# Patient Record
Sex: Female | Born: 1940
Health system: Southern US, Community
[De-identification: ages and names within clinical notes are randomized; demographics above are authoritative.]

## PROBLEM LIST (undated history)

## (undated) DIAGNOSIS — L309 Dermatitis, unspecified: Secondary | ICD-10-CM

## (undated) DIAGNOSIS — R002 Palpitations: Secondary | ICD-10-CM

## (undated) DIAGNOSIS — IMO0001 Reserved for inherently not codable concepts without codable children: Secondary | ICD-10-CM

## (undated) DIAGNOSIS — I10 Essential (primary) hypertension: Secondary | ICD-10-CM

## (undated) DIAGNOSIS — Z853 Personal history of malignant neoplasm of breast: Secondary | ICD-10-CM

## (undated) DIAGNOSIS — Z923 Personal history of irradiation: Secondary | ICD-10-CM

## (undated) DIAGNOSIS — L509 Urticaria, unspecified: Secondary | ICD-10-CM

## (undated) DIAGNOSIS — C541 Malignant neoplasm of endometrium: Secondary | ICD-10-CM

## (undated) DIAGNOSIS — I493 Ventricular premature depolarization: Secondary | ICD-10-CM

## (undated) DIAGNOSIS — IMO0002 Reserved for concepts with insufficient information to code with codable children: Secondary | ICD-10-CM

## (undated) DIAGNOSIS — Z803 Family history of malignant neoplasm of breast: Secondary | ICD-10-CM

## (undated) DIAGNOSIS — E785 Hyperlipidemia, unspecified: Secondary | ICD-10-CM

## (undated) DIAGNOSIS — E7800 Pure hypercholesterolemia, unspecified: Secondary | ICD-10-CM

## (undated) HISTORY — PX: FOOT SURGERY: SHX648

## (undated) HISTORY — DX: Palpitations: R00.2

## (undated) HISTORY — DX: Malignant neoplasm of endometrium: C54.1

## (undated) HISTORY — DX: Personal history of malignant neoplasm of breast: Z85.3

## (undated) HISTORY — DX: Reserved for inherently not codable concepts without codable children: IMO0001

## (undated) HISTORY — PX: TOTAL ABDOMINAL HYSTERECTOMY W/ BILATERAL SALPINGOOPHORECTOMY: SHX83

## (undated) HISTORY — DX: Essential (primary) hypertension: I10

## (undated) HISTORY — DX: Family history of malignant neoplasm of breast: Z80.3

## (undated) HISTORY — DX: Hyperlipidemia, unspecified: E78.5

## (undated) HISTORY — DX: Dermatitis, unspecified: L30.9

## (undated) HISTORY — PX: COLONOSCOPY: SHX174

## (undated) HISTORY — PX: ADENOIDECTOMY: SUR15

## (undated) HISTORY — DX: Pure hypercholesterolemia, unspecified: E78.00

## (undated) HISTORY — DX: Ventricular premature depolarization: I49.3

## (undated) HISTORY — DX: Urticaria, unspecified: L50.9

## (undated) HISTORY — PX: TONSILLECTOMY: SHX5217

## (undated) HISTORY — DX: Personal history of irradiation: Z92.3

## (undated) HISTORY — PX: APPENDECTOMY: SHX54

## (undated) HISTORY — PX: TONSILLECTOMY: SUR1361

## (undated) HISTORY — DX: Reserved for concepts with insufficient information to code with codable children: IMO0002

---

## 1998-05-13 DIAGNOSIS — Z853 Personal history of malignant neoplasm of breast: Secondary | ICD-10-CM

## 1998-05-13 HISTORY — DX: Personal history of malignant neoplasm of breast: Z85.3

## 1998-05-13 HISTORY — PX: BREAST LUMPECTOMY: SHX2

## 1998-12-06 ENCOUNTER — Ambulatory Visit (HOSPITAL_COMMUNITY): Admission: RE | Admit: 1998-12-06 | Discharge: 1998-12-06 | Payer: Self-pay | Admitting: Cardiology

## 1998-12-06 ENCOUNTER — Encounter: Payer: Self-pay | Admitting: Cardiology

## 1998-12-25 ENCOUNTER — Other Ambulatory Visit: Admission: RE | Admit: 1998-12-25 | Discharge: 1998-12-25 | Payer: Self-pay | Admitting: Obstetrics and Gynecology

## 1998-12-26 ENCOUNTER — Other Ambulatory Visit: Admission: RE | Admit: 1998-12-26 | Discharge: 1998-12-26 | Payer: Self-pay | Admitting: Obstetrics and Gynecology

## 1998-12-26 ENCOUNTER — Encounter (INDEPENDENT_AMBULATORY_CARE_PROVIDER_SITE_OTHER): Payer: Self-pay | Admitting: Specialist

## 1999-03-15 ENCOUNTER — Encounter (INDEPENDENT_AMBULATORY_CARE_PROVIDER_SITE_OTHER): Payer: Self-pay | Admitting: *Deleted

## 1999-03-15 ENCOUNTER — Ambulatory Visit (HOSPITAL_BASED_OUTPATIENT_CLINIC_OR_DEPARTMENT_OTHER): Admission: RE | Admit: 1999-03-15 | Discharge: 1999-03-15 | Payer: Self-pay

## 1999-03-21 ENCOUNTER — Encounter: Admission: RE | Admit: 1999-03-21 | Discharge: 1999-06-19 | Payer: Self-pay | Admitting: Radiation Oncology

## 1999-03-30 ENCOUNTER — Encounter (INDEPENDENT_AMBULATORY_CARE_PROVIDER_SITE_OTHER): Payer: Self-pay | Admitting: *Deleted

## 1999-03-30 ENCOUNTER — Ambulatory Visit (HOSPITAL_COMMUNITY): Admission: RE | Admit: 1999-03-30 | Discharge: 1999-04-02 | Payer: Self-pay

## 1999-06-20 ENCOUNTER — Encounter: Admission: RE | Admit: 1999-06-20 | Discharge: 1999-09-18 | Payer: Self-pay | Admitting: Radiation Oncology

## 1999-09-06 ENCOUNTER — Ambulatory Visit (HOSPITAL_COMMUNITY): Admission: RE | Admit: 1999-09-06 | Discharge: 1999-09-06 | Payer: Self-pay | Admitting: Psychiatry

## 1999-11-12 ENCOUNTER — Encounter: Admission: RE | Admit: 1999-11-12 | Discharge: 1999-11-12 | Payer: Self-pay | Admitting: Oncology

## 1999-11-12 ENCOUNTER — Encounter: Payer: Self-pay | Admitting: Oncology

## 2000-01-17 ENCOUNTER — Other Ambulatory Visit: Admission: RE | Admit: 2000-01-17 | Discharge: 2000-01-17 | Payer: Self-pay | Admitting: Obstetrics and Gynecology

## 2000-03-03 ENCOUNTER — Other Ambulatory Visit: Admission: RE | Admit: 2000-03-03 | Discharge: 2000-03-03 | Payer: Self-pay | Admitting: Obstetrics and Gynecology

## 2000-03-03 ENCOUNTER — Encounter (INDEPENDENT_AMBULATORY_CARE_PROVIDER_SITE_OTHER): Payer: Self-pay | Admitting: Specialist

## 2000-04-07 ENCOUNTER — Ambulatory Visit (HOSPITAL_COMMUNITY): Admission: RE | Admit: 2000-04-07 | Discharge: 2000-04-07 | Payer: Self-pay | Admitting: Neurology

## 2000-04-07 ENCOUNTER — Encounter: Payer: Self-pay | Admitting: Neurology

## 2000-07-10 ENCOUNTER — Other Ambulatory Visit: Admission: RE | Admit: 2000-07-10 | Discharge: 2000-07-10 | Payer: Self-pay | Admitting: Obstetrics and Gynecology

## 2000-11-12 ENCOUNTER — Other Ambulatory Visit: Admission: RE | Admit: 2000-11-12 | Discharge: 2000-11-12 | Payer: Self-pay | Admitting: Obstetrics and Gynecology

## 2000-12-03 ENCOUNTER — Ambulatory Visit (HOSPITAL_COMMUNITY): Admission: RE | Admit: 2000-12-03 | Discharge: 2000-12-03 | Payer: Self-pay | Admitting: Cardiology

## 2000-12-03 ENCOUNTER — Encounter: Payer: Self-pay | Admitting: Cardiology

## 2000-12-08 ENCOUNTER — Encounter: Admission: RE | Admit: 2000-12-08 | Discharge: 2000-12-08 | Payer: Self-pay | Admitting: Oncology

## 2000-12-08 ENCOUNTER — Encounter: Payer: Self-pay | Admitting: Oncology

## 2001-03-18 ENCOUNTER — Other Ambulatory Visit: Admission: RE | Admit: 2001-03-18 | Discharge: 2001-03-18 | Payer: Self-pay | Admitting: Obstetrics and Gynecology

## 2001-08-14 ENCOUNTER — Ambulatory Visit (HOSPITAL_COMMUNITY): Admission: RE | Admit: 2001-08-14 | Discharge: 2001-08-14 | Payer: Self-pay | Admitting: Gastroenterology

## 2001-12-18 ENCOUNTER — Encounter: Admission: RE | Admit: 2001-12-18 | Discharge: 2001-12-18 | Payer: Self-pay | Admitting: Oncology

## 2001-12-18 ENCOUNTER — Encounter: Payer: Self-pay | Admitting: Oncology

## 2002-03-24 ENCOUNTER — Other Ambulatory Visit: Admission: RE | Admit: 2002-03-24 | Discharge: 2002-03-24 | Payer: Self-pay | Admitting: Obstetrics and Gynecology

## 2002-05-19 ENCOUNTER — Encounter: Payer: Self-pay | Admitting: Neurology

## 2002-05-19 ENCOUNTER — Ambulatory Visit (HOSPITAL_COMMUNITY): Admission: RE | Admit: 2002-05-19 | Discharge: 2002-05-19 | Payer: Self-pay | Admitting: Neurology

## 2002-10-07 ENCOUNTER — Ambulatory Visit (HOSPITAL_COMMUNITY): Admission: RE | Admit: 2002-10-07 | Discharge: 2002-10-07 | Payer: Self-pay | Admitting: Cardiology

## 2002-10-07 ENCOUNTER — Encounter: Payer: Self-pay | Admitting: Cardiology

## 2002-12-20 ENCOUNTER — Encounter: Admission: RE | Admit: 2002-12-20 | Discharge: 2002-12-20 | Payer: Self-pay | Admitting: Oncology

## 2002-12-20 ENCOUNTER — Encounter: Payer: Self-pay | Admitting: Oncology

## 2003-03-30 ENCOUNTER — Other Ambulatory Visit: Admission: RE | Admit: 2003-03-30 | Discharge: 2003-03-30 | Payer: Self-pay | Admitting: Obstetrics and Gynecology

## 2004-01-04 ENCOUNTER — Encounter: Admission: RE | Admit: 2004-01-04 | Discharge: 2004-01-04 | Payer: Self-pay | Admitting: Obstetrics and Gynecology

## 2004-04-13 ENCOUNTER — Ambulatory Visit: Payer: Self-pay | Admitting: Oncology

## 2004-05-13 DIAGNOSIS — Z923 Personal history of irradiation: Secondary | ICD-10-CM

## 2004-05-13 HISTORY — DX: Personal history of irradiation: Z92.3

## 2004-08-09 ENCOUNTER — Ambulatory Visit: Payer: Self-pay | Admitting: Oncology

## 2004-08-10 ENCOUNTER — Ambulatory Visit: Payer: Self-pay | Admitting: Oncology

## 2004-12-03 ENCOUNTER — Ambulatory Visit: Payer: Self-pay | Admitting: Oncology

## 2004-12-12 ENCOUNTER — Ambulatory Visit (HOSPITAL_COMMUNITY): Admission: RE | Admit: 2004-12-12 | Discharge: 2004-12-12 | Payer: Self-pay | Admitting: Neurology

## 2005-01-04 ENCOUNTER — Encounter: Admission: RE | Admit: 2005-01-04 | Discharge: 2005-01-04 | Payer: Self-pay | Admitting: Oncology

## 2005-03-28 ENCOUNTER — Encounter: Admission: RE | Admit: 2005-03-28 | Discharge: 2005-03-28 | Payer: Self-pay | Admitting: Oncology

## 2005-04-23 ENCOUNTER — Encounter: Admission: RE | Admit: 2005-04-23 | Discharge: 2005-04-23 | Payer: Self-pay | Admitting: Oncology

## 2005-06-03 ENCOUNTER — Ambulatory Visit: Payer: Self-pay | Admitting: Oncology

## 2005-09-26 ENCOUNTER — Ambulatory Visit: Payer: Self-pay | Admitting: Oncology

## 2006-01-16 ENCOUNTER — Encounter: Admission: RE | Admit: 2006-01-16 | Discharge: 2006-01-16 | Payer: Self-pay | Admitting: Obstetrics and Gynecology

## 2006-03-25 ENCOUNTER — Emergency Department (HOSPITAL_COMMUNITY): Admission: EM | Admit: 2006-03-25 | Discharge: 2006-03-25 | Payer: Self-pay | Admitting: Family Medicine

## 2006-04-15 ENCOUNTER — Ambulatory Visit: Payer: Self-pay | Admitting: Oncology

## 2006-04-18 LAB — CBC WITH DIFFERENTIAL/PLATELET
BASO%: 0.5 % (ref 0.0–2.0)
Basophils Absolute: 0 10*3/uL (ref 0.0–0.1)
EOS%: 1.9 % (ref 0.0–7.0)
Eosinophils Absolute: 0.1 10*3/uL (ref 0.0–0.5)
HCT: 37.9 % (ref 34.8–46.6)
HGB: 12.8 g/dL (ref 11.6–15.9)
LYMPH%: 36.3 % (ref 14.0–48.0)
MCH: 31.8 pg (ref 26.0–34.0)
MCHC: 33.8 g/dL (ref 32.0–36.0)
MCV: 94.1 fL (ref 81.0–101.0)
MONO#: 0.8 10*3/uL (ref 0.1–0.9)
MONO%: 12.1 % (ref 0.0–13.0)
NEUT#: 3.1 10*3/uL (ref 1.5–6.5)
NEUT%: 49.2 % (ref 39.6–76.8)
Platelets: 264 10*3/uL (ref 145–400)
RBC: 4.03 10*6/uL (ref 3.70–5.32)
RDW: 13.2 % (ref 11.3–14.5)
WBC: 6.3 10*3/uL (ref 3.9–10.0)
lymph#: 2.3 10*3/uL (ref 0.9–3.3)

## 2006-04-18 LAB — COMPREHENSIVE METABOLIC PANEL
ALT: 19 U/L (ref 0–35)
AST: 26 U/L (ref 0–37)
Albumin: 4.2 g/dL (ref 3.5–5.2)
Alkaline Phosphatase: 57 U/L (ref 39–117)
BUN: 16 mg/dL (ref 6–23)
CO2: 27 mEq/L (ref 19–32)
Calcium: 9.4 mg/dL (ref 8.4–10.5)
Chloride: 101 mEq/L (ref 96–112)
Creatinine, Ser: 0.63 mg/dL (ref 0.40–1.20)
Glucose, Bld: 85 mg/dL (ref 70–99)
Potassium: 4.1 mEq/L (ref 3.5–5.3)
Sodium: 141 mEq/L (ref 135–145)
Total Bilirubin: 1 mg/dL (ref 0.3–1.2)
Total Protein: 6.7 g/dL (ref 6.0–8.3)

## 2007-02-05 ENCOUNTER — Encounter: Admission: RE | Admit: 2007-02-05 | Discharge: 2007-02-05 | Payer: Self-pay | Admitting: Oncology

## 2007-04-07 ENCOUNTER — Encounter: Admission: RE | Admit: 2007-04-07 | Discharge: 2007-04-07 | Payer: Self-pay | Admitting: Oncology

## 2007-05-11 ENCOUNTER — Ambulatory Visit: Payer: Self-pay | Admitting: Oncology

## 2007-05-13 LAB — CBC WITH DIFFERENTIAL/PLATELET
BASO%: 1.9 % (ref 0.0–2.0)
Basophils Absolute: 0.1 10*3/uL (ref 0.0–0.1)
EOS%: 2.4 % (ref 0.0–7.0)
Eosinophils Absolute: 0.1 10*3/uL (ref 0.0–0.5)
HCT: 38.7 % (ref 34.8–46.6)
HGB: 13.2 g/dL (ref 11.6–15.9)
LYMPH%: 31.5 % (ref 14.0–48.0)
MCH: 31.5 pg (ref 26.0–34.0)
MCHC: 34 g/dL (ref 32.0–36.0)
MCV: 92.6 fL (ref 81.0–101.0)
MONO#: 0.6 10*3/uL (ref 0.1–0.9)
MONO%: 11.9 % (ref 0.0–13.0)
NEUT#: 2.9 10*3/uL (ref 1.5–6.5)
NEUT%: 52.3 % (ref 39.6–76.8)
Platelets: 239 10*3/uL (ref 145–400)
RBC: 4.18 10*6/uL (ref 3.70–5.32)
RDW: 13.2 % (ref 11.3–14.5)
WBC: 5.4 10*3/uL (ref 3.9–10.0)
lymph#: 1.7 10*3/uL (ref 0.9–3.3)

## 2007-05-13 LAB — COMPREHENSIVE METABOLIC PANEL
ALT: 17 U/L (ref 0–35)
AST: 29 U/L (ref 0–37)
Albumin: 4.2 g/dL (ref 3.5–5.2)
Alkaline Phosphatase: 57 U/L (ref 39–117)
BUN: 20 mg/dL (ref 6–23)
CO2: 26 mEq/L (ref 19–32)
Calcium: 9.4 mg/dL (ref 8.4–10.5)
Chloride: 105 mEq/L (ref 96–112)
Creatinine, Ser: 0.68 mg/dL (ref 0.40–1.20)
Glucose, Bld: 83 mg/dL (ref 70–99)
Potassium: 4 mEq/L (ref 3.5–5.3)
Sodium: 142 mEq/L (ref 135–145)
Total Bilirubin: 0.8 mg/dL (ref 0.3–1.2)
Total Protein: 7 g/dL (ref 6.0–8.3)

## 2007-12-01 ENCOUNTER — Ambulatory Visit: Admission: RE | Admit: 2007-12-01 | Discharge: 2007-12-01 | Payer: Self-pay | Admitting: Gynecologic Oncology

## 2007-12-01 ENCOUNTER — Ambulatory Visit (HOSPITAL_COMMUNITY): Admission: RE | Admit: 2007-12-01 | Discharge: 2007-12-01 | Payer: Self-pay | Admitting: Gynecologic Oncology

## 2007-12-12 DIAGNOSIS — C541 Malignant neoplasm of endometrium: Secondary | ICD-10-CM

## 2007-12-12 HISTORY — DX: Malignant neoplasm of endometrium: C54.1

## 2007-12-28 ENCOUNTER — Ambulatory Visit: Payer: Self-pay | Admitting: Oncology

## 2008-01-04 LAB — CBC WITH DIFFERENTIAL/PLATELET
BASO%: 0.5 % (ref 0.0–2.0)
Basophils Absolute: 0 10*3/uL (ref 0.0–0.1)
EOS%: 5.7 % (ref 0.0–7.0)
Eosinophils Absolute: 0.4 10*3/uL (ref 0.0–0.5)
HCT: 38.3 % (ref 34.8–46.6)
HGB: 13.1 g/dL (ref 11.6–15.9)
LYMPH%: 23.6 % (ref 14.0–48.0)
MCH: 32.7 pg (ref 26.0–34.0)
MCHC: 34.2 g/dL (ref 32.0–36.0)
MCV: 95.4 fL (ref 81.0–101.0)
MONO#: 0.7 10*3/uL (ref 0.1–0.9)
MONO%: 11.4 % (ref 0.0–13.0)
NEUT#: 3.7 10*3/uL (ref 1.5–6.5)
NEUT%: 58.8 % (ref 39.6–76.8)
Platelets: 317 10*3/uL (ref 145–400)
RBC: 4.01 10*6/uL (ref 3.70–5.32)
RDW: 13.4 % (ref 11.3–14.5)
WBC: 6.4 10*3/uL (ref 3.9–10.0)
lymph#: 1.5 10*3/uL (ref 0.9–3.3)

## 2008-01-04 LAB — URINALYSIS, MICROSCOPIC - CHCC
Bilirubin (Urine): NEGATIVE
Glucose: NEGATIVE g/dL
Ketones: 5 mg/dL
Leukocyte Esterase: NEGATIVE
Nitrite: NEGATIVE
Protein: NEGATIVE mg/dL
Specific Gravity, Urine: 1.01 (ref 1.003–1.035)
WBC, UA: NEGATIVE (ref 0–2)
pH: 7 (ref 4.6–8.0)

## 2008-01-04 LAB — COMPREHENSIVE METABOLIC PANEL
ALT: 13 U/L (ref 0–35)
AST: 23 U/L (ref 0–37)
Albumin: 3.9 g/dL (ref 3.5–5.2)
Alkaline Phosphatase: 46 U/L (ref 39–117)
BUN: 16 mg/dL (ref 6–23)
CO2: 26 mEq/L (ref 19–32)
Calcium: 9.4 mg/dL (ref 8.4–10.5)
Chloride: 104 mEq/L (ref 96–112)
Creatinine, Ser: 0.66 mg/dL (ref 0.40–1.20)
Glucose, Bld: 81 mg/dL (ref 70–99)
Potassium: 4.1 mEq/L (ref 3.5–5.3)
Sodium: 142 mEq/L (ref 135–145)
Total Bilirubin: 0.6 mg/dL (ref 0.3–1.2)
Total Protein: 6.7 g/dL (ref 6.0–8.3)

## 2008-01-06 ENCOUNTER — Ambulatory Visit: Admission: RE | Admit: 2008-01-06 | Discharge: 2008-01-26 | Payer: Self-pay | Admitting: Radiation Oncology

## 2008-01-06 LAB — URINE CULTURE

## 2008-01-12 ENCOUNTER — Ambulatory Visit: Admission: RE | Admit: 2008-01-12 | Discharge: 2008-01-12 | Payer: Self-pay | Admitting: Gynecologic Oncology

## 2008-01-22 LAB — CBC WITH DIFFERENTIAL/PLATELET
BASO%: 0.2 % (ref 0.0–2.0)
Basophils Absolute: 0 10*3/uL (ref 0.0–0.1)
EOS%: 0.1 % (ref 0.0–7.0)
Eosinophils Absolute: 0 10*3/uL (ref 0.0–0.5)
HCT: 40.5 % (ref 34.8–46.6)
HGB: 13.8 g/dL (ref 11.6–15.9)
LYMPH%: 10.2 % — ABNORMAL LOW (ref 14.0–48.0)
MCH: 32.2 pg (ref 26.0–34.0)
MCHC: 34.2 g/dL (ref 32.0–36.0)
MCV: 94.2 fL (ref 81.0–101.0)
MONO#: 0.1 10*3/uL (ref 0.1–0.9)
MONO%: 1.1 % (ref 0.0–13.0)
NEUT#: 5.8 10*3/uL (ref 1.5–6.5)
NEUT%: 88.4 % — ABNORMAL HIGH (ref 39.6–76.8)
Platelets: 212 10*3/uL (ref 145–400)
RBC: 4.3 10*6/uL (ref 3.70–5.32)
RDW: 12.1 % (ref 11.3–14.5)
WBC: 6.5 10*3/uL (ref 3.9–10.0)
lymph#: 0.7 10*3/uL — ABNORMAL LOW (ref 0.9–3.3)

## 2008-01-29 LAB — CBC WITH DIFFERENTIAL/PLATELET
BASO%: 1.8 % (ref 0.0–2.0)
Basophils Absolute: 0.1 10*3/uL (ref 0.0–0.1)
EOS%: 5.8 % (ref 0.0–7.0)
Eosinophils Absolute: 0.3 10*3/uL (ref 0.0–0.5)
HCT: 37.1 % (ref 34.8–46.6)
HGB: 12.6 g/dL (ref 11.6–15.9)
LYMPH%: 48 % (ref 14.0–48.0)
MCH: 31.7 pg (ref 26.0–34.0)
MCHC: 33.8 g/dL (ref 32.0–36.0)
MCV: 93.8 fL (ref 81.0–101.0)
MONO#: 0.2 10*3/uL (ref 0.1–0.9)
MONO%: 3.5 % (ref 0.0–13.0)
NEUT#: 1.8 10*3/uL (ref 1.5–6.5)
NEUT%: 41 % (ref 39.6–76.8)
Platelets: 187 10*3/uL (ref 145–400)
RBC: 3.96 10*6/uL (ref 3.70–5.32)
RDW: 11.5 % (ref 11.3–14.5)
WBC: 4.4 10*3/uL (ref 3.9–10.0)
lymph#: 2.1 10*3/uL (ref 0.9–3.3)

## 2008-02-02 LAB — CBC WITH DIFFERENTIAL/PLATELET
BASO%: 4.4 % — ABNORMAL HIGH (ref 0.0–2.0)
Basophils Absolute: 1 10*3/uL — ABNORMAL HIGH (ref 0.0–0.1)
EOS%: 2 % (ref 0.0–7.0)
Eosinophils Absolute: 0.4 10*3/uL (ref 0.0–0.5)
HCT: 35.8 % (ref 34.8–46.6)
HGB: 12.4 g/dL (ref 11.6–15.9)
LYMPH%: 13.2 % — ABNORMAL LOW (ref 14.0–48.0)
MCH: 32.7 pg (ref 26.0–34.0)
MCHC: 34.7 g/dL (ref 32.0–36.0)
MCV: 94.2 fL (ref 81.0–101.0)
MONO#: 2.4 10*3/uL — ABNORMAL HIGH (ref 0.1–0.9)
MONO%: 10.6 % (ref 0.0–13.0)
NEUT#: 15.5 10*3/uL — ABNORMAL HIGH (ref 1.5–6.5)
NEUT%: 69.8 % (ref 39.6–76.8)
Platelets: 203 10*3/uL (ref 145–400)
RBC: 3.8 10*6/uL (ref 3.70–5.32)
RDW: 12.4 % (ref 11.3–14.5)
WBC: 22.3 10*3/uL — ABNORMAL HIGH (ref 3.9–10.0)
lymph#: 2.9 10*3/uL (ref 0.9–3.3)

## 2008-02-10 LAB — CBC WITH DIFFERENTIAL/PLATELET
BASO%: 0.4 % (ref 0.0–2.0)
Basophils Absolute: 0 10*3/uL (ref 0.0–0.1)
EOS%: 1.6 % (ref 0.0–7.0)
Eosinophils Absolute: 0.1 10*3/uL (ref 0.0–0.5)
HCT: 34.5 % — ABNORMAL LOW (ref 34.8–46.6)
HGB: 11.8 g/dL (ref 11.6–15.9)
LYMPH%: 34.8 % (ref 14.0–48.0)
MCH: 32.4 pg (ref 26.0–34.0)
MCHC: 34.1 g/dL (ref 32.0–36.0)
MCV: 94.9 fL (ref 81.0–101.0)
MONO#: 0.5 10*3/uL (ref 0.1–0.9)
MONO%: 10.8 % (ref 0.0–13.0)
NEUT#: 2.4 10*3/uL (ref 1.5–6.5)
NEUT%: 52.4 % (ref 39.6–76.8)
Platelets: 151 10*3/uL (ref 145–400)
RBC: 3.64 10*6/uL — ABNORMAL LOW (ref 3.70–5.32)
RDW: 12.9 % (ref 11.3–14.5)
WBC: 4.6 10*3/uL (ref 3.9–10.0)
lymph#: 1.6 10*3/uL (ref 0.9–3.3)

## 2008-02-10 LAB — COMPREHENSIVE METABOLIC PANEL
ALT: 15 U/L (ref 0–35)
AST: 21 U/L (ref 0–37)
Albumin: 4 g/dL (ref 3.5–5.2)
Alkaline Phosphatase: 47 U/L (ref 39–117)
BUN: 17 mg/dL (ref 6–23)
CO2: 24 mEq/L (ref 19–32)
Calcium: 9 mg/dL (ref 8.4–10.5)
Chloride: 108 mEq/L (ref 96–112)
Creatinine, Ser: 0.61 mg/dL (ref 0.40–1.20)
Glucose, Bld: 134 mg/dL — ABNORMAL HIGH (ref 70–99)
Potassium: 3.5 mEq/L (ref 3.5–5.3)
Sodium: 142 mEq/L (ref 135–145)
Total Bilirubin: 0.5 mg/dL (ref 0.3–1.2)
Total Protein: 6.3 g/dL (ref 6.0–8.3)

## 2008-02-10 LAB — CA 125: CA 125: 11.1 U/mL (ref 0.0–30.2)

## 2008-02-12 ENCOUNTER — Ambulatory Visit: Payer: Self-pay | Admitting: Oncology

## 2008-02-17 ENCOUNTER — Ambulatory Visit (HOSPITAL_COMMUNITY): Admission: RE | Admit: 2008-02-17 | Discharge: 2008-02-17 | Payer: Self-pay | Admitting: Oncology

## 2008-02-19 LAB — CBC WITH DIFFERENTIAL/PLATELET
BASO%: 1.2 % (ref 0.0–2.0)
Basophils Absolute: 0 10*3/uL (ref 0.0–0.1)
EOS%: 2.6 % (ref 0.0–7.0)
Eosinophils Absolute: 0.1 10*3/uL (ref 0.0–0.5)
HCT: 32.3 % — ABNORMAL LOW (ref 34.8–46.6)
HGB: 11.1 g/dL — ABNORMAL LOW (ref 11.6–15.9)
LYMPH%: 57.3 % — ABNORMAL HIGH (ref 14.0–48.0)
MCH: 31.8 pg (ref 26.0–34.0)
MCHC: 34.3 g/dL (ref 32.0–36.0)
MCV: 92.9 fL (ref 81.0–101.0)
MONO#: 0.2 10*3/uL (ref 0.1–0.9)
MONO%: 5.5 % (ref 0.0–13.0)
NEUT#: 1 10*3/uL — ABNORMAL LOW (ref 1.5–6.5)
NEUT%: 33.4 % — ABNORMAL LOW (ref 39.6–76.8)
Platelets: 176 10*3/uL (ref 145–400)
RBC: 3.47 10*6/uL — ABNORMAL LOW (ref 3.70–5.32)
RDW: 12.1 % (ref 11.3–14.5)
WBC: 3.1 10*3/uL — ABNORMAL LOW (ref 3.9–10.0)
lymph#: 1.8 10*3/uL (ref 0.9–3.3)

## 2008-02-22 LAB — CBC WITH DIFFERENTIAL/PLATELET
BASO%: 1.7 % (ref 0.0–2.0)
Basophils Absolute: 0.1 10*3/uL (ref 0.0–0.1)
EOS%: 2.2 % (ref 0.0–7.0)
Eosinophils Absolute: 0.1 10*3/uL (ref 0.0–0.5)
HCT: 35.1 % (ref 34.8–46.6)
HGB: 11.7 g/dL (ref 11.6–15.9)
LYMPH%: 39.1 % (ref 14.0–48.0)
MCH: 31.6 pg (ref 26.0–34.0)
MCHC: 33.4 g/dL (ref 32.0–36.0)
MCV: 94.6 fL (ref 81.0–101.0)
MONO#: 1.2 10*3/uL — ABNORMAL HIGH (ref 0.1–0.9)
MONO%: 23.8 % — ABNORMAL HIGH (ref 0.0–13.0)
NEUT#: 1.7 10*3/uL (ref 1.5–6.5)
NEUT%: 33.2 % — ABNORMAL LOW (ref 39.6–76.8)
Platelets: 206 10*3/uL (ref 145–400)
RBC: 3.71 10*6/uL (ref 3.70–5.32)
RDW: 13.5 % (ref 11.3–14.5)
WBC: 5.2 10*3/uL (ref 3.9–10.0)
lymph#: 2 10*3/uL (ref 0.9–3.3)

## 2008-03-02 LAB — CBC WITH DIFFERENTIAL/PLATELET
BASO%: 0.5 % (ref 0.0–2.0)
Basophils Absolute: 0 10*3/uL (ref 0.0–0.1)
EOS%: 1.1 % (ref 0.0–7.0)
Eosinophils Absolute: 0 10*3/uL (ref 0.0–0.5)
HCT: 32.5 % — ABNORMAL LOW (ref 34.8–46.6)
HGB: 11.2 g/dL — ABNORMAL LOW (ref 11.6–15.9)
LYMPH%: 37.9 % (ref 14.0–48.0)
MCH: 32.8 pg (ref 26.0–34.0)
MCHC: 34.5 g/dL (ref 32.0–36.0)
MCV: 94.9 fL (ref 81.0–101.0)
MONO#: 0.4 10*3/uL (ref 0.1–0.9)
MONO%: 9.2 % (ref 0.0–13.0)
NEUT#: 2.1 10*3/uL (ref 1.5–6.5)
NEUT%: 51.3 % (ref 39.6–76.8)
Platelets: 146 10*3/uL (ref 145–400)
RBC: 3.42 10*6/uL — ABNORMAL LOW (ref 3.70–5.32)
RDW: 14.4 % (ref 11.3–14.5)
WBC: 4 10*3/uL (ref 3.9–10.0)
lymph#: 1.5 10*3/uL (ref 0.9–3.3)

## 2008-03-02 LAB — COMPREHENSIVE METABOLIC PANEL
ALT: 17 U/L (ref 0–35)
AST: 22 U/L (ref 0–37)
Albumin: 3.9 g/dL (ref 3.5–5.2)
Alkaline Phosphatase: 55 U/L (ref 39–117)
BUN: 16 mg/dL (ref 6–23)
CO2: 27 mEq/L (ref 19–32)
Calcium: 9.4 mg/dL (ref 8.4–10.5)
Chloride: 108 mEq/L (ref 96–112)
Creatinine, Ser: 0.67 mg/dL (ref 0.40–1.20)
Glucose, Bld: 109 mg/dL — ABNORMAL HIGH (ref 70–99)
Potassium: 4 mEq/L (ref 3.5–5.3)
Sodium: 144 mEq/L (ref 135–145)
Total Bilirubin: 0.7 mg/dL (ref 0.3–1.2)
Total Protein: 6.5 g/dL (ref 6.0–8.3)

## 2008-03-10 ENCOUNTER — Ambulatory Visit: Admission: RE | Admit: 2008-03-10 | Discharge: 2008-05-02 | Payer: Self-pay | Admitting: Radiation Oncology

## 2008-03-14 LAB — CBC WITH DIFFERENTIAL/PLATELET
BASO%: 2.1 % — ABNORMAL HIGH (ref 0.0–2.0)
Basophils Absolute: 0.1 10*3/uL (ref 0.0–0.1)
EOS%: 2 % (ref 0.0–7.0)
Eosinophils Absolute: 0.1 10*3/uL (ref 0.0–0.5)
HCT: 31.8 % — ABNORMAL LOW (ref 34.8–46.6)
HGB: 10.9 g/dL — ABNORMAL LOW (ref 11.6–15.9)
LYMPH%: 54.4 % — ABNORMAL HIGH (ref 14.0–48.0)
MCH: 32.5 pg (ref 26.0–34.0)
MCHC: 34.3 g/dL (ref 32.0–36.0)
MCV: 94.9 fL (ref 81.0–101.0)
MONO#: 0.7 10*3/uL (ref 0.1–0.9)
MONO%: 19.8 % — ABNORMAL HIGH (ref 0.0–13.0)
NEUT#: 0.8 10*3/uL — ABNORMAL LOW (ref 1.5–6.5)
NEUT%: 21.6 % — ABNORMAL LOW (ref 39.6–76.8)
Platelets: 203 10*3/uL (ref 145–400)
RBC: 3.36 10*6/uL — ABNORMAL LOW (ref 3.70–5.32)
RDW: 14 % (ref 11.3–14.5)
WBC: 3.7 10*3/uL — ABNORMAL LOW (ref 3.9–10.0)
lymph#: 2 10*3/uL (ref 0.9–3.3)

## 2008-03-22 LAB — CBC WITH DIFFERENTIAL/PLATELET
BASO%: 0.5 % (ref 0.0–2.0)
Basophils Absolute: 0 10*3/uL (ref 0.0–0.1)
EOS%: 0.5 % (ref 0.0–7.0)
Eosinophils Absolute: 0 10*3/uL (ref 0.0–0.5)
HCT: 31 % — ABNORMAL LOW (ref 34.8–46.6)
HGB: 10.7 g/dL — ABNORMAL LOW (ref 11.6–15.9)
LYMPH%: 43.7 % (ref 14.0–48.0)
MCH: 33.8 pg (ref 26.0–34.0)
MCHC: 34.6 g/dL (ref 32.0–36.0)
MCV: 97.4 fL (ref 81.0–101.0)
MONO#: 0.6 10*3/uL (ref 0.1–0.9)
MONO%: 13.5 % — ABNORMAL HIGH (ref 0.0–13.0)
NEUT#: 2 10*3/uL (ref 1.5–6.5)
NEUT%: 41.8 % (ref 39.6–76.8)
Platelets: 195 10*3/uL (ref 145–400)
RBC: 3.18 10*6/uL — ABNORMAL LOW (ref 3.70–5.32)
RDW: 16.8 % — ABNORMAL HIGH (ref 11.3–14.5)
WBC: 4.7 10*3/uL (ref 3.9–10.0)
lymph#: 2 10*3/uL (ref 0.9–3.3)

## 2008-03-22 LAB — COMPREHENSIVE METABOLIC PANEL
ALT: 20 U/L (ref 0–35)
AST: 28 U/L (ref 0–37)
Albumin: 4 g/dL (ref 3.5–5.2)
Alkaline Phosphatase: 55 U/L (ref 39–117)
BUN: 20 mg/dL (ref 6–23)
CO2: 24 mEq/L (ref 19–32)
Calcium: 9.1 mg/dL (ref 8.4–10.5)
Chloride: 106 mEq/L (ref 96–112)
Creatinine, Ser: 0.61 mg/dL (ref 0.40–1.20)
Glucose, Bld: 88 mg/dL (ref 70–99)
Potassium: 3.6 mEq/L (ref 3.5–5.3)
Sodium: 143 mEq/L (ref 135–145)
Total Bilirubin: 0.4 mg/dL (ref 0.3–1.2)
Total Protein: 6.9 g/dL (ref 6.0–8.3)

## 2008-03-29 ENCOUNTER — Ambulatory Visit: Payer: Self-pay | Admitting: Oncology

## 2008-03-31 LAB — CBC WITH DIFFERENTIAL/PLATELET
BASO%: 1.3 % (ref 0.0–2.0)
Basophils Absolute: 0.1 10*3/uL (ref 0.0–0.1)
EOS%: 1.4 % (ref 0.0–7.0)
Eosinophils Absolute: 0.1 10*3/uL (ref 0.0–0.5)
HCT: 35.8 % (ref 34.8–46.6)
HGB: 11.9 g/dL (ref 11.6–15.9)
LYMPH%: 35.4 % (ref 14.0–48.0)
MCH: 32.6 pg (ref 26.0–34.0)
MCHC: 33.4 g/dL (ref 32.0–36.0)
MCV: 97.7 fL (ref 81.0–101.0)
MONO#: 0.9 10*3/uL (ref 0.1–0.9)
MONO%: 14.8 % — ABNORMAL HIGH (ref 0.0–13.0)
NEUT#: 2.9 10*3/uL (ref 1.5–6.5)
NEUT%: 47.1 % (ref 39.6–76.8)
Platelets: 219 10*3/uL (ref 145–400)
RBC: 3.66 10*6/uL — ABNORMAL LOW (ref 3.70–5.32)
RDW: 16.1 % — ABNORMAL HIGH (ref 11.3–14.5)
WBC: 6.1 10*3/uL (ref 3.9–10.0)
lymph#: 2.2 10*3/uL (ref 0.9–3.3)

## 2008-04-18 LAB — COMPREHENSIVE METABOLIC PANEL
ALT: 22 U/L (ref 0–35)
AST: 25 U/L (ref 0–37)
Albumin: 4 g/dL (ref 3.5–5.2)
Alkaline Phosphatase: 74 U/L (ref 39–117)
BUN: 20 mg/dL (ref 6–23)
CO2: 25 mEq/L (ref 19–32)
Calcium: 9.1 mg/dL (ref 8.4–10.5)
Chloride: 102 mEq/L (ref 96–112)
Creatinine, Ser: 0.56 mg/dL (ref 0.40–1.20)
Glucose, Bld: 96 mg/dL (ref 70–99)
Potassium: 3.8 mEq/L (ref 3.5–5.3)
Sodium: 144 mEq/L (ref 135–145)
Total Bilirubin: 0.6 mg/dL (ref 0.3–1.2)
Total Protein: 6.5 g/dL (ref 6.0–8.3)

## 2008-04-18 LAB — CBC WITH DIFFERENTIAL/PLATELET
BASO%: 0.3 % (ref 0.0–2.0)
Basophils Absolute: 0 10*3/uL (ref 0.0–0.1)
EOS%: 1.5 % (ref 0.0–7.0)
Eosinophils Absolute: 0.1 10*3/uL (ref 0.0–0.5)
HCT: 31.6 % — ABNORMAL LOW (ref 34.8–46.6)
HGB: 10.9 g/dL — ABNORMAL LOW (ref 11.6–15.9)
LYMPH%: 19.3 % (ref 14.0–48.0)
MCH: 34 pg (ref 26.0–34.0)
MCHC: 34.4 g/dL (ref 32.0–36.0)
MCV: 99 fL (ref 81.0–101.0)
MONO#: 0.8 10*3/uL (ref 0.1–0.9)
MONO%: 9.3 % (ref 0.0–13.0)
NEUT#: 5.7 10*3/uL (ref 1.5–6.5)
NEUT%: 69.6 % (ref 39.6–76.8)
Platelets: 124 10*3/uL — ABNORMAL LOW (ref 145–400)
RBC: 3.19 10*6/uL — ABNORMAL LOW (ref 3.70–5.32)
RDW: 17.4 % — ABNORMAL HIGH (ref 11.3–14.5)
WBC: 8.2 10*3/uL (ref 3.9–10.0)
lymph#: 1.6 10*3/uL (ref 0.9–3.3)

## 2008-05-12 ENCOUNTER — Ambulatory Visit: Payer: Self-pay | Admitting: Oncology

## 2008-05-18 ENCOUNTER — Ambulatory Visit: Payer: Self-pay | Admitting: Oncology

## 2008-05-18 LAB — CBC WITH DIFFERENTIAL/PLATELET
BASO%: 0.4 % (ref 0.0–2.0)
Basophils Absolute: 0 10*3/uL (ref 0.0–0.1)
EOS%: 2.6 % (ref 0.0–7.0)
Eosinophils Absolute: 0.1 10*3/uL (ref 0.0–0.5)
HCT: 28 % — ABNORMAL LOW (ref 34.8–46.6)
HGB: 9.7 g/dL — ABNORMAL LOW (ref 11.6–15.9)
LYMPH%: 32.2 % (ref 14.0–48.0)
MCH: 36.5 pg — ABNORMAL HIGH (ref 26.0–34.0)
MCHC: 34.8 g/dL (ref 32.0–36.0)
MCV: 104.8 fL — ABNORMAL HIGH (ref 81.0–101.0)
MONO#: 0.6 10*3/uL (ref 0.1–0.9)
MONO%: 15.5 % — ABNORMAL HIGH (ref 0.0–13.0)
NEUT#: 1.8 10*3/uL (ref 1.5–6.5)
NEUT%: 49.3 % (ref 39.6–76.8)
Platelets: 108 10*3/uL — ABNORMAL LOW (ref 145–400)
RBC: 2.67 10*6/uL — ABNORMAL LOW (ref 3.70–5.32)
RDW: 17.4 % — ABNORMAL HIGH (ref 11.3–14.5)
WBC: 3.7 10*3/uL — ABNORMAL LOW (ref 3.9–10.0)
lymph#: 1.2 10*3/uL (ref 0.9–3.3)

## 2008-05-18 LAB — COMPREHENSIVE METABOLIC PANEL
ALT: 14 U/L (ref 0–35)
AST: 22 U/L (ref 0–37)
Albumin: 4.1 g/dL (ref 3.5–5.2)
Alkaline Phosphatase: 55 U/L (ref 39–117)
BUN: 18 mg/dL (ref 6–23)
CO2: 25 mEq/L (ref 19–32)
Calcium: 8.7 mg/dL (ref 8.4–10.5)
Chloride: 105 mEq/L (ref 96–112)
Creatinine, Ser: 0.68 mg/dL (ref 0.40–1.20)
Glucose, Bld: 90 mg/dL (ref 70–99)
Potassium: 4.2 mEq/L (ref 3.5–5.3)
Sodium: 140 mEq/L (ref 135–145)
Total Bilirubin: 0.6 mg/dL (ref 0.3–1.2)
Total Protein: 6.4 g/dL (ref 6.0–8.3)

## 2008-06-10 ENCOUNTER — Ambulatory Visit (HOSPITAL_COMMUNITY): Admission: RE | Admit: 2008-06-10 | Discharge: 2008-06-10 | Payer: Self-pay | Admitting: Oncology

## 2008-06-14 ENCOUNTER — Ambulatory Visit: Admission: RE | Admit: 2008-06-14 | Discharge: 2008-06-14 | Payer: Self-pay | Admitting: Gynecologic Oncology

## 2008-06-14 LAB — CBC WITH DIFFERENTIAL/PLATELET
BASO%: 1.1 % (ref 0.0–2.0)
Basophils Absolute: 0.1 10*3/uL (ref 0.0–0.1)
EOS%: 4.2 % (ref 0.0–7.0)
Eosinophils Absolute: 0.2 10*3/uL (ref 0.0–0.5)
HCT: 29.6 % — ABNORMAL LOW (ref 34.8–46.6)
HGB: 10.3 g/dL — ABNORMAL LOW (ref 11.6–15.9)
LYMPH%: 24.7 % (ref 14.0–48.0)
MCH: 36.8 pg — ABNORMAL HIGH (ref 26.0–34.0)
MCHC: 34.7 g/dL (ref 32.0–36.0)
MCV: 106.1 fL — ABNORMAL HIGH (ref 81.0–101.0)
MONO#: 0.6 10*3/uL (ref 0.1–0.9)
MONO%: 11.3 % (ref 0.0–13.0)
NEUT#: 3.3 10*3/uL (ref 1.5–6.5)
NEUT%: 58.7 % (ref 39.6–76.8)
Platelets: 145 10*3/uL (ref 145–400)
RBC: 2.79 10*6/uL — ABNORMAL LOW (ref 3.70–5.32)
RDW: 17.4 % — ABNORMAL HIGH (ref 11.3–14.5)
WBC: 5.6 10*3/uL (ref 3.9–10.0)
lymph#: 1.4 10*3/uL (ref 0.9–3.3)

## 2008-07-08 ENCOUNTER — Ambulatory Visit: Payer: Self-pay | Admitting: Oncology

## 2008-07-12 LAB — COMPREHENSIVE METABOLIC PANEL
ALT: 15 U/L (ref 0–35)
AST: 22 U/L (ref 0–37)
Albumin: 4.2 g/dL (ref 3.5–5.2)
Alkaline Phosphatase: 38 U/L — ABNORMAL LOW (ref 39–117)
BUN: 23 mg/dL (ref 6–23)
CO2: 26 mEq/L (ref 19–32)
Calcium: 9.3 mg/dL (ref 8.4–10.5)
Chloride: 105 mEq/L (ref 96–112)
Creatinine, Ser: 0.81 mg/dL (ref 0.40–1.20)
Glucose, Bld: 78 mg/dL (ref 70–99)
Potassium: 4.3 mEq/L (ref 3.5–5.3)
Sodium: 140 mEq/L (ref 135–145)
Total Bilirubin: 0.7 mg/dL (ref 0.3–1.2)
Total Protein: 6.6 g/dL (ref 6.0–8.3)

## 2008-07-12 LAB — CBC WITH DIFFERENTIAL/PLATELET
BASO%: 0.7 % (ref 0.0–2.0)
Basophils Absolute: 0 10*3/uL (ref 0.0–0.1)
EOS%: 9 % — ABNORMAL HIGH (ref 0.0–7.0)
Eosinophils Absolute: 0.4 10*3/uL (ref 0.0–0.5)
HCT: 34.3 % — ABNORMAL LOW (ref 34.8–46.6)
HGB: 11.8 g/dL (ref 11.6–15.9)
LYMPH%: 32 % (ref 14.0–49.7)
MCH: 36.3 pg — ABNORMAL HIGH (ref 25.1–34.0)
MCHC: 34.4 g/dL (ref 31.5–36.0)
MCV: 105.7 fL — ABNORMAL HIGH (ref 79.5–101.0)
MONO#: 0.5 10*3/uL (ref 0.1–0.9)
MONO%: 10.2 % (ref 0.0–14.0)
NEUT#: 2.1 10*3/uL (ref 1.5–6.5)
NEUT%: 48.1 % (ref 38.4–76.8)
Platelets: 153 10*3/uL (ref 145–400)
RBC: 3.25 10*6/uL — ABNORMAL LOW (ref 3.70–5.45)
RDW: 13.4 % (ref 11.2–14.5)
WBC: 4.5 10*3/uL (ref 3.9–10.3)
lymph#: 1.4 10*3/uL (ref 0.9–3.3)

## 2008-07-20 ENCOUNTER — Encounter: Payer: Self-pay | Admitting: Gynecologic Oncology

## 2008-07-20 ENCOUNTER — Other Ambulatory Visit: Admission: RE | Admit: 2008-07-20 | Discharge: 2008-07-20 | Payer: Self-pay | Admitting: Gynecologic Oncology

## 2008-07-20 ENCOUNTER — Ambulatory Visit: Admission: RE | Admit: 2008-07-20 | Discharge: 2008-07-20 | Payer: Self-pay | Admitting: Gynecologic Oncology

## 2008-08-19 ENCOUNTER — Encounter: Admission: RE | Admit: 2008-08-19 | Discharge: 2008-08-19 | Payer: Self-pay | Admitting: Oncology

## 2008-11-01 ENCOUNTER — Other Ambulatory Visit: Admission: RE | Admit: 2008-11-01 | Discharge: 2008-11-01 | Payer: Self-pay | Admitting: Radiation Oncology

## 2008-11-01 ENCOUNTER — Encounter: Payer: Self-pay | Admitting: Radiation Oncology

## 2008-11-01 ENCOUNTER — Ambulatory Visit: Admission: RE | Admit: 2008-11-01 | Discharge: 2008-11-01 | Payer: Self-pay | Admitting: Radiation Oncology

## 2009-01-09 ENCOUNTER — Ambulatory Visit: Payer: Self-pay | Admitting: Oncology

## 2009-01-11 LAB — COMPREHENSIVE METABOLIC PANEL
ALT: 17 U/L (ref 0–35)
AST: 26 U/L (ref 0–37)
Albumin: 4.1 g/dL (ref 3.5–5.2)
Alkaline Phosphatase: 42 U/L (ref 39–117)
BUN: 15 mg/dL (ref 6–23)
CO2: 26 mEq/L (ref 19–32)
Calcium: 9.7 mg/dL (ref 8.4–10.5)
Chloride: 103 mEq/L (ref 96–112)
Creatinine, Ser: 0.82 mg/dL (ref 0.40–1.20)
Glucose, Bld: 102 mg/dL — ABNORMAL HIGH (ref 70–99)
Potassium: 4.8 mEq/L (ref 3.5–5.3)
Sodium: 140 mEq/L (ref 135–145)
Total Bilirubin: 0.7 mg/dL (ref 0.3–1.2)
Total Protein: 6.7 g/dL (ref 6.0–8.3)

## 2009-01-11 LAB — CBC WITH DIFFERENTIAL/PLATELET
BASO%: 0.4 % (ref 0.0–2.0)
Basophils Absolute: 0 10*3/uL (ref 0.0–0.1)
EOS%: 5.8 % (ref 0.0–7.0)
Eosinophils Absolute: 0.3 10*3/uL (ref 0.0–0.5)
HCT: 35.9 % (ref 34.8–46.6)
HGB: 12 g/dL (ref 11.6–15.9)
LYMPH%: 25.5 % (ref 14.0–49.7)
MCH: 33.7 pg (ref 25.1–34.0)
MCHC: 33.5 g/dL (ref 31.5–36.0)
MCV: 100.7 fL (ref 79.5–101.0)
MONO#: 0.5 10*3/uL (ref 0.1–0.9)
MONO%: 10.5 % (ref 0.0–14.0)
NEUT#: 2.8 10*3/uL (ref 1.5–6.5)
NEUT%: 57.8 % (ref 38.4–76.8)
Platelets: 178 10*3/uL (ref 145–400)
RBC: 3.56 10*6/uL — ABNORMAL LOW (ref 3.70–5.45)
RDW: 13.5 % (ref 11.2–14.5)
WBC: 4.9 10*3/uL (ref 3.9–10.3)
lymph#: 1.2 10*3/uL (ref 0.9–3.3)

## 2009-01-25 ENCOUNTER — Other Ambulatory Visit: Admission: RE | Admit: 2009-01-25 | Discharge: 2009-01-25 | Payer: Self-pay | Admitting: Gynecologic Oncology

## 2009-01-25 ENCOUNTER — Encounter: Payer: Self-pay | Admitting: Gynecologic Oncology

## 2009-01-25 ENCOUNTER — Ambulatory Visit: Admission: RE | Admit: 2009-01-25 | Discharge: 2009-01-25 | Payer: Self-pay | Admitting: Gynecologic Oncology

## 2009-04-25 ENCOUNTER — Other Ambulatory Visit: Admission: RE | Admit: 2009-04-25 | Discharge: 2009-04-25 | Payer: Self-pay | Admitting: Radiation Oncology

## 2009-04-25 ENCOUNTER — Ambulatory Visit: Admission: RE | Admit: 2009-04-25 | Discharge: 2009-04-25 | Payer: Self-pay | Admitting: Radiation Oncology

## 2009-08-23 ENCOUNTER — Other Ambulatory Visit: Admission: RE | Admit: 2009-08-23 | Discharge: 2009-08-23 | Payer: Self-pay | Admitting: Gynecologic Oncology

## 2009-08-23 ENCOUNTER — Ambulatory Visit: Admission: RE | Admit: 2009-08-23 | Discharge: 2009-08-23 | Payer: Self-pay | Admitting: Gynecologic Oncology

## 2009-09-06 ENCOUNTER — Encounter: Admission: RE | Admit: 2009-09-06 | Discharge: 2009-09-06 | Payer: Self-pay | Admitting: Oncology

## 2009-09-15 ENCOUNTER — Encounter: Admission: RE | Admit: 2009-09-15 | Discharge: 2009-09-15 | Payer: Self-pay | Admitting: Oncology

## 2009-10-23 ENCOUNTER — Encounter: Admission: RE | Admit: 2009-10-23 | Discharge: 2009-10-23 | Payer: Self-pay | Admitting: Oncology

## 2009-12-04 ENCOUNTER — Other Ambulatory Visit: Admission: RE | Admit: 2009-12-04 | Discharge: 2009-12-04 | Payer: Self-pay | Admitting: Radiation Oncology

## 2009-12-04 ENCOUNTER — Ambulatory Visit: Admission: RE | Admit: 2009-12-04 | Discharge: 2009-12-04 | Payer: Self-pay | Admitting: Radiation Oncology

## 2010-01-03 ENCOUNTER — Ambulatory Visit: Payer: Self-pay | Admitting: Oncology

## 2010-01-05 LAB — CBC WITH DIFFERENTIAL/PLATELET
BASO%: 0.3 % (ref 0.0–2.0)
Basophils Absolute: 0 10*3/uL (ref 0.0–0.1)
EOS%: 3.6 % (ref 0.0–7.0)
Eosinophils Absolute: 0.2 10*3/uL (ref 0.0–0.5)
HCT: 37.3 % (ref 34.8–46.6)
HGB: 12.5 g/dL (ref 11.6–15.9)
LYMPH%: 24.3 % (ref 14.0–49.7)
MCH: 33 pg (ref 25.1–34.0)
MCHC: 33.5 g/dL (ref 31.5–36.0)
MCV: 98.4 fL (ref 79.5–101.0)
MONO#: 0.7 10*3/uL (ref 0.1–0.9)
MONO%: 10.4 % (ref 0.0–14.0)
NEUT#: 4.1 10*3/uL (ref 1.5–6.5)
NEUT%: 61.4 % (ref 38.4–76.8)
Platelets: 148 10*3/uL (ref 145–400)
RBC: 3.79 10*6/uL (ref 3.70–5.45)
RDW: 13.7 % (ref 11.2–14.5)
WBC: 6.6 10*3/uL (ref 3.9–10.3)
lymph#: 1.6 10*3/uL (ref 0.9–3.3)

## 2010-01-05 LAB — COMPREHENSIVE METABOLIC PANEL
ALT: 15 U/L (ref 0–35)
AST: 25 U/L (ref 0–37)
Albumin: 4.1 g/dL (ref 3.5–5.2)
Alkaline Phosphatase: 44 U/L (ref 39–117)
BUN: 20 mg/dL (ref 6–23)
CO2: 29 mEq/L (ref 19–32)
Calcium: 9.9 mg/dL (ref 8.4–10.5)
Chloride: 104 mEq/L (ref 96–112)
Creatinine, Ser: 0.78 mg/dL (ref 0.40–1.20)
Glucose, Bld: 108 mg/dL — ABNORMAL HIGH (ref 70–99)
Potassium: 4.1 mEq/L (ref 3.5–5.3)
Sodium: 141 mEq/L (ref 135–145)
Total Bilirubin: 0.9 mg/dL (ref 0.3–1.2)
Total Protein: 6.8 g/dL (ref 6.0–8.3)

## 2010-02-14 ENCOUNTER — Other Ambulatory Visit
Admission: RE | Admit: 2010-02-14 | Discharge: 2010-02-14 | Payer: Self-pay | Source: Home / Self Care | Admitting: Gynecologic Oncology

## 2010-02-14 ENCOUNTER — Ambulatory Visit
Admission: RE | Admit: 2010-02-14 | Discharge: 2010-02-14 | Payer: Self-pay | Source: Home / Self Care | Admitting: Gynecologic Oncology

## 2010-06-02 ENCOUNTER — Other Ambulatory Visit: Payer: Self-pay | Admitting: Oncology

## 2010-06-02 DIAGNOSIS — Z853 Personal history of malignant neoplasm of breast: Secondary | ICD-10-CM

## 2010-06-02 DIAGNOSIS — Z9889 Other specified postprocedural states: Secondary | ICD-10-CM

## 2010-06-02 DIAGNOSIS — Z780 Asymptomatic menopausal state: Secondary | ICD-10-CM

## 2010-06-02 DIAGNOSIS — Z0000 Encounter for general adult medical examination without abnormal findings: Secondary | ICD-10-CM

## 2010-06-03 ENCOUNTER — Encounter: Payer: Self-pay | Admitting: Oncology

## 2010-06-04 ENCOUNTER — Encounter: Payer: Self-pay | Admitting: Oncology

## 2010-06-12 ENCOUNTER — Other Ambulatory Visit (HOSPITAL_COMMUNITY)
Admission: RE | Admit: 2010-06-12 | Discharge: 2010-06-12 | Disposition: A | Discharge status: Home / Self Care | Payer: BC Managed Care – PPO | Source: Ambulatory Visit | Attending: Gynecologic Oncology | Admitting: Gynecologic Oncology

## 2010-06-12 ENCOUNTER — Other Ambulatory Visit: Payer: Self-pay | Admitting: Radiation Oncology

## 2010-06-12 ENCOUNTER — Ambulatory Visit
Admission: RE | Admit: 2010-06-12 | Discharge: 2010-06-12 | Payer: Self-pay | Source: Home / Self Care | Attending: Radiation Oncology | Admitting: Radiation Oncology

## 2010-06-12 DIAGNOSIS — C549 Malignant neoplasm of corpus uteri, unspecified: Secondary | ICD-10-CM | POA: Insufficient documentation

## 2010-08-20 ENCOUNTER — Telehealth: Payer: Self-pay | Admitting: Cardiology

## 2010-08-20 NOTE — Telephone Encounter (Signed)
NEEDS A SCRIPT FOR ATENOLOL 25MG  WANTS TO PICK UP LATE TOMARROW OR WED.

## 2010-08-20 NOTE — Telephone Encounter (Signed)
X [phone], CALL PT ABOUT HER MEDS. PLACED CHART IN BOX.

## 2010-08-23 ENCOUNTER — Telehealth: Payer: Self-pay | Admitting: Cardiology

## 2010-08-23 NOTE — Telephone Encounter (Signed)
Refill for Atenolol 45mg . Please call husband because she will be out of town. I have pulled the chart.

## 2010-08-24 ENCOUNTER — Telehealth: Payer: Self-pay | Admitting: Cardiology

## 2010-08-24 NOTE — Telephone Encounter (Signed)
Adv patient Rx. Ready for pickup

## 2010-08-24 NOTE — Telephone Encounter (Signed)
Would like notification if her perscription is available yet for office pick-up. Atenolol 25mg . I have pulled the chart.

## 2010-08-29 ENCOUNTER — Other Ambulatory Visit (HOSPITAL_COMMUNITY)
Admission: RE | Admit: 2010-08-29 | Discharge: 2010-08-29 | Disposition: A | Discharge status: Home / Self Care | Payer: BC Managed Care – PPO | Source: Ambulatory Visit | Attending: Gynecologic Oncology | Admitting: Gynecologic Oncology

## 2010-08-29 ENCOUNTER — Other Ambulatory Visit: Payer: Self-pay | Admitting: Gynecologic Oncology

## 2010-08-29 ENCOUNTER — Ambulatory Visit: Payer: BC Managed Care – PPO | Attending: Gynecologic Oncology | Admitting: Gynecologic Oncology

## 2010-08-29 DIAGNOSIS — Z7982 Long term (current) use of aspirin: Secondary | ICD-10-CM | POA: Insufficient documentation

## 2010-08-29 DIAGNOSIS — Z79899 Other long term (current) drug therapy: Secondary | ICD-10-CM | POA: Insufficient documentation

## 2010-08-29 DIAGNOSIS — N3946 Mixed incontinence: Secondary | ICD-10-CM | POA: Insufficient documentation

## 2010-08-29 DIAGNOSIS — C549 Malignant neoplasm of corpus uteri, unspecified: Secondary | ICD-10-CM | POA: Insufficient documentation

## 2010-08-29 DIAGNOSIS — Z90710 Acquired absence of both cervix and uterus: Secondary | ICD-10-CM | POA: Insufficient documentation

## 2010-08-29 DIAGNOSIS — Z8540 Personal history of malignant neoplasm of unspecified female genital organ: Secondary | ICD-10-CM | POA: Insufficient documentation

## 2010-08-31 NOTE — Consult Note (Signed)
NAMECARLIE, SOLORZANO NO.:  [id number]  MEDICAL RECORD NO.:  [id number]           PATIENT TYPE:  LOCATION:                                 FACILITY:  PHYSICIAN:  Tracyann Duffell A. Duard Brady, MD    DATE OF BIRTH:  22-Sep-1940  DATE OF CONSULTATION:  08/29/2010 DATE OF DISCHARGE:                                CONSULTATION   Ms. [name] is a very pleasant 70 year old with a history of a stage IB papillary serous carcinoma.  The patient underwent laparoscopic hysterectomy and appropriate staging in August 2009.  Pathology revealed a papillary serous carcinoma with 36% myometrial invasion, 15 negative lymph nodes.  She did have LVSI.  Preoperative CT scan was negative and her CA-125 was normal at 7.1.  Postoperatively, she received 6 cycles of paclitaxel and carboplatin based chemotherapy with vaginal cuff brachytherapy.  Her last cycle of chemotherapy was in January 2010 and posttreatment CT scan was negative.  I last saw her in October 2011 at which time her exam and Pap smear were negative.  She was seen by Dr. Roselind Messier in January of this year with a negative evaluation.  She comes in today for followup.  REVIEW OF SYSTEMS:  Negative for 10 points.  She denies any chest pain, shortness of breath, nausea, vomiting, fevers, chills, headaches, visual changes.  She denies any change in her bowel or bladder habits.  She has persistent stress and urge incontinence.  After lengthy discussion, she would like to try Detrol.  She understands that there is some association issues with memory and cognitive loss with Detrol.  She was given information regarding this and she actually was aware of this change.  She otherwise has no complaints.  She denies any vaginal bleeding.  She enjoyed a very nice early Mother's Day gift from her daughter and that they went to [place] for the weekend.  HEALTH MAINTENANCE:  She is up-to-date on her mammogram.  She has been scheduled for this in  May.  MEDICATIONS:  Medication list is reviewed and is unchanged.  Her medications include: 1. Atenolol 25 mg twice daily. 2. Aspirin daily. 3. Amlodipine. 4. Simvastatin.  PHYSICAL EXAMINATION:  VITAL SIGNS:  Weight 106 pounds, blood pressure 122/70, pulse 70, respirations 16, temperature 98. GENERAL:  A well-nourished, well-developed female, in no acute distress. NECK:  Supple.  There is no lymphadenopathy, no thyromegaly. LUNGS:  Clear to auscultation bilaterally. CARDIOVASCULAR EXAM:  Regular rate and rhythm. ABDOMEN:  She has well-healed surgical incisions.  There are no palpable masses or hepatosplenomegaly.  Groins are negative for adenopathy. There are no incisional hernias. EXTREMITIES:  There is no edema. PELVIC:  External genitalia is within normal limits and markedly atrophic.  The vagina is atrophic.  There are radiation changes in the proximal vagina.  ThinPrep Pap was submitted without difficulty. Bimanual examination reveals no masses or nodularity.  Rectal confirms.  ASSESSMENT:  A 70 year old with stage 1B old FIGO staging, new FIGO staging stage 1A uterine papillary serous carcinoma.  She clinically has no evidence of recurrent disease.  PLAN:  She has a followup with Dr. Roselind Messier  in the summer.  That will be about 3 to 4 months for now.  At that point, she will be nearing her 3- year anniversary.  She will see [place] 6 months after that.  We then will see every 6 months for a total of 2 additional years of followup.     Manna Gose A. Duard Brady, MD     PAG/MEDQ  D:  08/29/2010  T:  08/30/2010  Job:  [id number]  cc:   Telford Nab, R.N. [address] [place], [place] [address]  Lennis P. Darrold Span, M.D. Fax: [phone]  Cassell Clement, M.D. Fax: [phone]  Lenoard Aden, M.D. Fax: [phone]  Billie Lade, Ph.D., M.D. Fax: [phone]  Electronically Signed by Cleda Mccreedy MD on 08/31/2010 08:50:12 AM

## 2010-09-10 ENCOUNTER — Ambulatory Visit
Admission: RE | Admit: 2010-09-10 | Discharge: 2010-09-10 | Disposition: A | Discharge status: Home / Self Care | Payer: BC Managed Care – PPO | Source: Ambulatory Visit | Attending: Oncology | Admitting: Oncology

## 2010-09-10 ENCOUNTER — Encounter: Payer: Self-pay | Admitting: Diagnostic Radiology

## 2010-09-10 DIAGNOSIS — Z9889 Other specified postprocedural states: Secondary | ICD-10-CM

## 2010-09-10 DIAGNOSIS — Z0000 Encounter for general adult medical examination without abnormal findings: Secondary | ICD-10-CM

## 2010-09-10 DIAGNOSIS — Z780 Asymptomatic menopausal state: Secondary | ICD-10-CM

## 2010-09-10 DIAGNOSIS — Z853 Personal history of malignant neoplasm of breast: Secondary | ICD-10-CM

## 2010-09-25 NOTE — Consult Note (Signed)
[name], [name] NO.:  [id number]   MEDICAL RECORD NO.:  [id number]          PATIENT TYPE:  OUT   LOCATION:  GYN                          FACILITY:  [facility]   PHYSICIAN:  Paola A. Duard Brady, MD    DATE OF BIRTH:  1940/08/23   DATE OF CONSULTATION:  12/01/2007  DATE OF DISCHARGE:                                 CONSULTATION   REFERRING PHYSICIAN:  Dr. Olivia Mackie.   Patient seen today in consultation at the request of Dr. Billy Coast.   HISTORY OF PRESENT ILLNESS:  Ms. Halberg is a very pleasant 70 year old  gravida 3, para 2, aborta 1who has been having a thin discharge for a  few months, which she describes since probably the fall of 2008.  Initially, she was diagnosed with probable vaginitis and was treated  with Flagyl and Cleocin with really no significant benefit.  In January,  2009 she had a Pap smear that showed atypical squamous cells of  undetermined significance.  This follow-up Pap smear was done in June.  At that time Dr. Billy Coast noted the discharge and performed a colposcopy  and a biopsy.  At that time, vaginal discharge was more pronounced and  watery.  Ultrasound was done that showed a thickened endometrial stripe.  An endometrial biopsy was performed that revealed a serous carcinoma of  the endometrium.  She was subsequently referred to [place].   REVIEW OF SYSTEMS:  She denies any chest pain, shortness of breath,  nausea, vomiting, fevers, chills, headaches, visual changes, bloating,  unintentional weight loss or weight gain.  She is clearly anxious and  upset by this new diagnosis.   PAST MEDICAL HISTORY:  Significant for:  1. Microinvasive right-sided breast cancer diagnosed in 2000.  She was      treated with lumpectomy with sentinel lymph node, local radiation,      tamoxifen for 5 years and an aromatase inhibitor for a year and      half, which she tolerated poorly.  The aromatase inhibitor was      discontinued in December, 2007.  2.  Hypertension.  3. Hypercholesterolemia.  4. History of PVCs.   PAST SURGICAL HISTORY:  1. Lumpectomy with sentinel lymph node dissection in 2000.  2. In 1982 she had a benign breast lumpectomy on the contralateral      left side.  3. Appendectomy.  4. Tonsillectomy.   MEDICATIONS:  1. Multivitamins.  2. Alpha lipoic acid 100 mg daily.  3. Potassium gluconate 595 mg daily.  4. Coenzyme Q10 100 mg daily.  5. Acetylcarnitine 100 mg daily.  6. Magnesium 250 mg daily.  7. Vitamin D 1000 international units daily.  8. Calcium 600 daily.  9. Fish oil 1000 daily.  10.Simvastatin 40 mg daily.  11.Amlodipine 5 mg daily.  12.Baby aspirin 81 mg daily.  13.Atenolol 25 mg b.i.d.   ALLERGIES:  ACE INHIBITORS WHICH CAUSE TONGUE SWELLING, DEMEROL AND  CODEINE CAUSE NAUSEA.   SOCIAL HISTORY:  She denies using tobacco.  She drinks a few glasses of  wine every evening.  She is a International aid/development worker.  FAMILY HISTORY:  Her father died of an MI at the age of 43.  Her mother  died of congestive heart failure at the age of 74.  Her brother died  young.  He did have a history of coronary disease.  She has a maternal  aunt with breast cancer in her late 78s or early 4s.  She has two  daughters ages 9 and 54.   PHYSICAL EXAMINATION:  VITAL SIGNS:  Height 5 feet, weight 112 pounds,  blood pressure 118/70, pulse 70, respirations 20.  GENERAL:  Well-nourished, well-developed female in no acute distress.  NECK:  Supple with no lymphadenopathy, no thyromegaly.  LUNGS:  Clear to auscultation bilaterally.  CARDIOVASCULAR:  Regular rate and rhythm.  ABDOMEN:  Shows well-healed right lower quadrant skin incision.  Abdomen  is soft, nontender, nondistended.  No palpable masses or  hepatosplenomegaly.  Groins are negative for adenopathy.  EXTREMITIES:  No edema.  PELVIC:  External genitalia is within normal limits, though atrophic.  The vagina is atrophic.  The cervix is multiparous.  There is a thin,   watery discharge.  No gross, visible lesions.  Bimanual examination:  The cervix is palpably normal.  The corpus is normal size, shape,  consistency.  There are no adnexal masses.  RECTAL:  Confirms.   ASSESSMENT:  A 70 year old with history of breast cancer who now has a  uterine serous carcinoma.   PLAN:  We will obtain a CT scan today for imaging to rule out any  parenchymal or any diffuse metastatic disease.  Barring that that is  otherwise unremarkable, we will contact the patient regarding surgical  scheduling.  We tentatively have her scheduled for laparoscopic  hysterectomy, BSO and appropriate staging on December 17, 2007 at [facility].  This has been communicated with the patient.  This is the  earliest date that we are available surgically and she would rather go  to [facility] to have the surgery than any other delay.  Risks and  benefits of surgery were discussed with the patient.  We also discussed  the often required issue regarding postoperative chemotherapy and  radiation.  She understands this.  She would like to have this done in  [place], if possible.  I discussed with her that plans regarding  adjuvant therapy would be based on final pathology, which they  understand.  We discussed the most common risks and benefits including  bleeding, infection, injury to surrounding organs, need for laparotomy  and lymphedema.  Her questions were elicited, answered to her  satisfaction.  She will go to [facility] for preop visit.  I have asked  her to stop her fish oil.  She has already discontinued her baby  aspirin.  She was given my card and knows that she can call me should  she have any other questions.      Paola A. Duard Brady, MD  Electronically Signed     PAG/MEDQ  D:  12/01/2007  T:  12/01/2007  Job:  [id number]   cc:   Cassell Clement, M.D.  Fax: [phone]   Lenoard Aden, M.D.  Fax: [phone]   Lennis P. Darrold Span, M.D.  Fax: [phone]   Telford Nab,  R.N.  [address]  [place], [place] [address]

## 2010-09-25 NOTE — Consult Note (Signed)
NAMERUTHIE, BERCH                ACCOUNT NO.:  [id number]   MEDICAL RECORD NO.:  [id number]         PATIENT TYPE:  LOUT   LOCATION:                               FACILITY:  [facility]   PHYSICIAN:  Paola A. Duard Brady, MD    DATE OF BIRTH:  [birth year]   DATE OF CONSULTATION:  12/28/2007  DATE OF DISCHARGE:                                 CONSULTATION   Ms. Veach is a 70 year old history of breast cancer who was recently  diagnosed with an endometrial carcinoma.  She was initially seen by [place]  on December 07, 2007.  She underwent surgery at [facility] consisting of a  laparoscopic hysterectomy, BSO and appropriate staging.  Final pathology  was consistent with a stage IB uterine papillary serous carcinoma.  All  lymph nodes, 0/18, were negative, washings were negative, adnexa was  negative.  She did have 35% (6/17 mm) myometrial invasion.  There was  lymphovascular space involvement present.  She was presented at our GYN  oncology disposition conference and was dispositioned to sequential  chemotherapy and radiation, specifically Taxol and carboplatin-based  chemotherapy for three cycles, followed by vaginal radiation in addition  to three cycles of chemotherapy.  Alternatively, this vaginal cuff  brachytherapy could be done concomitantly.  The patient would very much  like to be treated here in [place] rather than at [facility].  She  is scheduled to see Dr. Jama Flavors for discussion of chemotherapy on  January 04, 2008.  Again, I have reviewed the pathology with the patient  and her husband, and they wish to proceed with adjuvant chemotherapy, as  well as vaginal brachytherapy.      Paola A. Duard Brady, MD  Electronically Signed     PAG/MEDQ  D:  12/29/2007  T:  12/29/2007  Job:  [id number]   cc:   Cassell Clement, M.D.  Fax: [phone]   Lenoard Aden, M.D.  Fax: [phone]   Lennis P. Darrold Span, M.D.  Fax: [phone]   Telford Nab, R.N.  [address]  [place], [place] [address]

## 2010-09-25 NOTE — Consult Note (Signed)
[name], [name] NO.:  [id number]   MEDICAL RECORD NO.:  [id number]          PATIENT TYPE:  OUT   LOCATION:  GYN                          FACILITY:  [facility]   PHYSICIAN:  Kelly A. Duard Brady, MD    DATE OF BIRTH:  [birth year]   DATE OF CONSULTATION:  01/12/2008  DATE OF DISCHARGE:                                 CONSULTATION   [name] is a very pleasant 70 year old with a stage IB papillary  serous carcinoma.  She underwent laparoscopic hysterectomy, BSO and  appropriate staging at [facility] on December 17, 2007.  Final pathology revealed  a stage IB carcinoma with 35% myometrial invasion.  Zero out of 15 lymph  nodes were involved. The washings were negative, adnexa were negative.  She did have lymphovascular space involvement.  She did have a  preoperative CT scan that was negative and a CA-125 that was 7.1 in the  preoperative time.  She did very well postoperatively.  She has been  dispositioned to Taxol and carboplatin based chemotherapy with  concurrent vaginal HDR.  She has been seen by both [name] and Dr.  Roselind Sandoval, and is due to start her chemotherapy on September 11.  She  states she is recover from surgery quite well.  She is back to work and  is overall feeling well.  She had a little bit of spotting for a few  days approximately 2-3 weeks after the surgery, but then that has  resolved.  She has had a few zings of discomfort, which have resolved  themselves.   PAST MEDICAL HISTORY:  Significant for:  1. Microinvasive right-sided breast cancer in 2000.  2. Hypertension.  3. Hypercholesterolemia.  4. History of PVCs.   PHYSICAL EXAMINATION:  Weight 113 pounds, which is stable.  Blood  pressure 126/72, well-nourished, well-developed female in no acute  distress.  ABDOMEN:  Shows well-healed abdominal incisions.  Steri-Strips removed.  She has a small amount of ecchymosis around her right lower quadrant  port incision.  Abdomen is soft and nontender.  PELVIC:  External genitalia is within normal limits though atrophic.  The vagina is atrophic.  The vaginal cuff is visualized.  Suture marks  are visible.  There are no visible lesions.  There is no granulation  tissue.  Bimanual examination reveals no vaginal cuff masses, nodularity  or tenderness.   ASSESSMENT:  A 70 year old with a stage I papillary serous carcinoma of  the endometrium who is doing well postoperatively.  We will let her  resume her usual activities.  She would like to start her walking  program back, which we will completely encourage.  She will follow up  with [name] and [name] as scheduled for chemotherapy.      Kelly A. Duard Brady, MD  Electronically Signed     PAG/MEDQ  D:  01/12/2008  T:  01/12/2008  Job:  [id number]   cc:   [name], M.D.  Fax: [phone]   [name], R.N.  [address]  [place], [place] [address]   [name], M.D.  Fax: [phone]  [name], M.D.  Fax: [phone]   [name], M.D.  Fax: [phone]

## 2010-09-25 NOTE — Consult Note (Signed)
NAMEQAMAR, ROSMAN NO.:  [id number]   MEDICAL RECORD NO.:  [id number]          PATIENT TYPE:  OUT   LOCATION:  GYN                          FACILITY:  [facility]   PHYSICIAN:  Paola A. Duard Brady, MD    DATE OF BIRTH:  07/01/1940   DATE OF CONSULTATION:  06/14/2008  DATE OF DISCHARGE:                                 CONSULTATION   The patient is a very pleasant 70 year old with stage 1B papillary  serous carcinoma who underwent laparoscopic hysterectomy, BSO and  staging at [facility] December 17, 2007.  Final pathology revealed a stage 1B  carcinoma with 35% myometrial invasion, 0/15 lymph nodes involvement.  Washings were negative.  She did have a LVSI.  Preoperative CT scan was  negative as was her CA-125 and that was 7.1 in the perioperative period.  She was dispositioned to vaginal cuff brachytherapy and 6 cycles of  paclitaxel and carboplatin.  She completed her last cycle of  chemotherapy 4 weeks ago and had her last HDR therapy under the care of  Dr. Roselind Messier on December 17.  She did have a post-treatment CT scan on  January 29 that revealed no evidence of metastatic disease within the  abdomen or pelvis.  There were no lung base lesions.  She did have some  mild scarring in the right middle lobe from a potential ammonia.  She is  overall doing quite well.  She comes accompanied by her husband.   REVIEW OF SYSTEMS:  She does complain of some neuropathy in her fingers  and toes, but she is able to perform her usual activities.  The  neuropathy in her feet was somewhat worse in the cold snow this past  weekend.  She denies any nausea, vomiting, abdominal pain, vaginal  bleeding, significant change in bowel or bladder habits.  Her energy  level is improving and she is looking forward to going back to the gym.   PHYSICAL EXAMINATION:  CONSTITUTIONAL:  Well-nourished, well-developed  female in no acute distress.  NECK:  Supple with no lymphadenopathy, no thyromegaly.  LUNGS:   Clear to auscultation bilaterally.  CARDIOVASCULAR:  Regular rate and rhythm.  ABDOMEN:  Shows well-healed right lower quadrant incision, well-healed  laparoscopy skin incisions.  There is no evidence of any incisional  hernias.  Abdomen is soft, nontender, nondistended.  No palpable masses  or hepatosplenomegaly.  Groins are negative for adenopathy.  EXTREMITIES:  No edema.  PELVIC:  External genitalia is within normal limits.  The vagina is  atrophic.  The vaginal cuff is visualized.  There are no visible  lesions.  Bimanual examination reveals no masses or nodularity.   ASSESSMENT:  A 70 year old with an old FIGO stage 1B, new FIGO stage 1A  papillary serous carcinoma status post 6 cycles of paclitaxel and  carboplatin and vaginal cuff HDR brachytherapy.  Post-treatment CT scan  was negative.   PLAN:  Return to see me in approximately 6 to 8 weeks for Pap smear and  then she will follow up with Dr. Roselind Messier and will be seen every 3 months  alternating  between Dr. Roselind Messier and myself for pelvic exam and Pap  smears.  Her CT scan at this time was negative.  We will proceed with  annual CT scan of the abdomen and pelvis, and chest x-rays to help in  ruling out recurrence of disease.  She had questions regarding tumor  markers.  I discussed with her  that her CA-125 was normal at the time of diagnosis, so I am not  necessarily sure that would be of particular value in this setting.  Their questions were elicited and answered to their satisfaction.  For  the neuropathy, I suggested that she could to start taking the vitamin  B6 to see if that alleviates some of her neuropathy symptoms.      Paola A. Duard Brady, MD  Electronically Signed     PAG/MEDQ  D:  06/14/2008  T:  06/14/2008  Job:  [id number]   cc:   Telford Nab, R.N.  [address]  [place], [place] [address]   Billie Lade, M.D.  Fax: [phone]   Cassell Clement, M.D.  Fax: [phone]   Lenoard Aden, M.D.  Fax:  [phone]   Lennis P. Darrold Span, M.D.  Fax: [phone]

## 2010-09-25 NOTE — Group Therapy Note (Signed)
NAMEHAYES, REHFELDT NO.:  [id number]   MEDICAL RECORD NO.:  [id number]          PATIENT TYPE:  OUT   LOCATION:  GYN                          FACILITY:  [facility]   PHYSICIAN:  Paola A. Duard Brady, MD    DATE OF BIRTH:  1941/03/08                                 PROGRESS NOTE   Ms. Bourbeau is a very pleasant 70 year old with stage IB papillary serous  carcinoma who underwent appropriate staging at [facility] in August 2009.  Final pathology was a IB carcinoma with 35% myometrial invasion,  negative lymph nodes, negative washings.  She did have LVSI.  Preoperative CT scan and post-treatment CT scan in January 2010 were  negative.  She was last seen by me in February 2010 but that was too  soon to do a Pap smear and comes in today for follow-up Pap smear.  Since we last saw her in February, she states if anything she is feeling  better, her appetite is improving.  She is going back to the gym.  Her  neuropathy has improved.   REVIEW OF SYSTEMS:  Is as above.   PHYSICAL EXAMINATION:  Weight 113 pounds, well-nourished, well-developed female in no acute  distress.  PELVIC:  External genitalia is within normal limits though atrophic.  The vagina is markedly atrophic.  The vaginal cuff is visualized.  Postoperative changes are noted.  There are no visible lesions.  ThinPrep Pap was submitted without difficulty.  Bimanual examination  reveals no masses or nodularity.  RECTAL:  Confirms.   ASSESSMENT:  A 70 year old with stage IB papillary serous carcinoma of the  endometrium, status post treatment, who has no evidence of recurrent  disease.   PLAN:  Will follow up on the results of her Pap smear from today.  She has an  appointment see Dr. Roselind Messier in June 2010 and will return to see [place] in  September.      Paola A. Duard Brady, MD  Electronically Signed     PAG/MEDQ  D:  07/20/2008  T:  07/21/2008  Job:  [id number]   cc:   Lennis P. Darrold Span, M.D.  Fax: [phone]   Lenoard Aden, M.D.  Fax: [phone]   Cassell Clement, M.D.  Fax: [phone]   Billie Lade, M.D.  Fax: [phone]   Telford Nab, R.N.  [address]  [place], [place] [address]

## 2010-11-06 ENCOUNTER — Telehealth: Payer: Self-pay | Admitting: Cardiology

## 2010-11-06 NOTE — Telephone Encounter (Signed)
Pt is having numbness/tingling in both legs; esp.the right leg. She wanted to speak with RN about what to do

## 2010-11-06 NOTE — Telephone Encounter (Signed)
Suggested she see a neurologist as soon as possible.

## 2010-11-06 NOTE — Telephone Encounter (Signed)
Numbness in fee and legs, bilateral.  This just started on Sunday.  Left message with oncologist yesterday, (wondering if secondary to chemo 3 years ago),not heard back.  No back problems with her back.  A little tingling in right hand as well.  Still able to walk without any problems, states it just feels strange.  Please advise

## 2010-11-06 NOTE — Telephone Encounter (Signed)
Faxed information to [facility] and advised patient.

## 2010-11-06 NOTE — Telephone Encounter (Signed)
Patient called in concerned that she cannot get a response from the neurologist office in regards to her appointment and was wondering if there was some way for you to help. Please call back. I could not find the chart.

## 2010-11-19 ENCOUNTER — Other Ambulatory Visit: Payer: Self-pay | Admitting: Cardiology

## 2010-11-19 DIAGNOSIS — I119 Hypertensive heart disease without heart failure: Secondary | ICD-10-CM

## 2010-11-19 MED ORDER — AMLODIPINE BESYLATE 5 MG PO TABS: 5.0000 mg | ORAL_TABLET | Freq: Every day | ORAL | Status: DC

## 2010-11-19 NOTE — Telephone Encounter (Signed)
Sent to pharmacy per patient request

## 2010-11-19 NOTE — Telephone Encounter (Signed)
Pt is out of norvasc would like a refill called to [facility] today if possible

## 2010-11-21 ENCOUNTER — Telehealth: Payer: Self-pay | Admitting: Cardiology

## 2010-11-21 NOTE — Telephone Encounter (Signed)
Called patient and advised rx sent to [facility].  Also called and left on there machine again.

## 2010-11-21 NOTE — Telephone Encounter (Signed)
Pt called about her rx for norvasc She says she called on Monday to get refill She is out of pills please call

## 2010-12-17 ENCOUNTER — Ambulatory Visit
Admission: RE | Admit: 2010-12-17 | Discharge: 2010-12-17 | Disposition: A | Discharge status: Home / Self Care | Payer: BC Managed Care – PPO | Source: Ambulatory Visit | Attending: Radiation Oncology | Admitting: Radiation Oncology

## 2010-12-17 ENCOUNTER — Other Ambulatory Visit: Payer: Self-pay | Admitting: Radiation Oncology

## 2010-12-17 ENCOUNTER — Other Ambulatory Visit (HOSPITAL_COMMUNITY)
Admission: RE | Admit: 2010-12-17 | Discharge: 2010-12-17 | Disposition: A | Discharge status: Home / Self Care | Payer: BC Managed Care – PPO | Source: Ambulatory Visit | Attending: Radiation Oncology | Admitting: Radiation Oncology

## 2010-12-17 DIAGNOSIS — C549 Malignant neoplasm of corpus uteri, unspecified: Secondary | ICD-10-CM | POA: Insufficient documentation

## 2010-12-21 ENCOUNTER — Encounter: Payer: Self-pay | Admitting: *Deleted

## 2010-12-26 ENCOUNTER — Other Ambulatory Visit: Payer: Self-pay | Admitting: Cardiology

## 2010-12-26 DIAGNOSIS — I10 Essential (primary) hypertension: Secondary | ICD-10-CM

## 2010-12-26 DIAGNOSIS — E7800 Pure hypercholesterolemia, unspecified: Secondary | ICD-10-CM

## 2010-12-26 DIAGNOSIS — Z853 Personal history of malignant neoplasm of breast: Secondary | ICD-10-CM

## 2010-12-28 ENCOUNTER — Other Ambulatory Visit (INDEPENDENT_AMBULATORY_CARE_PROVIDER_SITE_OTHER): Payer: BC Managed Care – PPO | Admitting: *Deleted

## 2010-12-28 ENCOUNTER — Ambulatory Visit (INDEPENDENT_AMBULATORY_CARE_PROVIDER_SITE_OTHER): Payer: BC Managed Care – PPO | Admitting: Cardiology

## 2010-12-28 ENCOUNTER — Other Ambulatory Visit: Payer: Self-pay | Admitting: Cardiology

## 2010-12-28 ENCOUNTER — Encounter: Payer: Self-pay | Admitting: Cardiology

## 2010-12-28 DIAGNOSIS — Z853 Personal history of malignant neoplasm of breast: Secondary | ICD-10-CM

## 2010-12-28 DIAGNOSIS — R002 Palpitations: Secondary | ICD-10-CM | POA: Insufficient documentation

## 2010-12-28 DIAGNOSIS — I10 Essential (primary) hypertension: Secondary | ICD-10-CM

## 2010-12-28 DIAGNOSIS — E7800 Pure hypercholesterolemia, unspecified: Secondary | ICD-10-CM | POA: Insufficient documentation

## 2010-12-28 LAB — BASIC METABOLIC PANEL
BUN: 17 mg/dL (ref 6–23)
CO2: 32 mEq/L (ref 19–32)
Calcium: 9.9 mg/dL (ref 8.4–10.5)
Chloride: 108 mEq/L (ref 96–112)
Creatinine, Ser: 0.7 mg/dL (ref 0.4–1.2)
GFR: 90.68 mL/min (ref >60.00)
Glucose, Bld: 102 mg/dL — ABNORMAL HIGH (ref 70–99)
Potassium: 5.8 mEq/L — ABNORMAL HIGH (ref 3.5–5.1)
Sodium: 146 mEq/L — ABNORMAL HIGH (ref 135–145)

## 2010-12-28 LAB — CBC WITH DIFFERENTIAL/PLATELET
Basophils Absolute: 0.2 10*3/uL — ABNORMAL HIGH (ref 0.0–0.1)
Basophils Relative: 2.4 % (ref 0.0–3.0)
Eosinophils Absolute: 0.3 10*3/uL (ref 0.0–0.7)
Eosinophils Relative: 4.4 % (ref 0.0–5.0)
HCT: 39.9 % (ref 36.0–46.0)
Hemoglobin: 13 g/dL (ref 12.0–15.0)
Lymphocytes Relative: 30.3 % (ref 12.0–46.0)
Lymphs Abs: 2 10*3/uL (ref 0.7–4.0)
MCHC: 32.6 g/dL (ref 30.0–36.0)
MCV: 97.3 fl (ref 78.0–100.0)
Monocytes Absolute: 0.7 10*3/uL (ref 0.1–1.0)
Monocytes Relative: 10.2 % (ref 3.0–12.0)
Neutro Abs: 3.5 10*3/uL (ref 1.4–7.7)
Neutrophils Relative %: 52.7 % (ref 43.0–77.0)
Platelets: 171 10*3/uL (ref 150.0–400.0)
RBC: 4.1 Mil/uL (ref 3.87–5.11)
RDW: 14.5 % (ref 11.5–14.6)
WBC: 6.6 10*3/uL (ref 4.5–10.5)

## 2010-12-28 LAB — HEPATIC FUNCTION PANEL
ALT: 17 U/L (ref 0–35)
AST: 31 U/L (ref 0–37)
Albumin: 4.1 g/dL (ref 3.5–5.2)
Alkaline Phosphatase: 44 U/L (ref 39–117)
Bilirubin, Direct: 0.1 mg/dL (ref 0.0–0.3)
Total Bilirubin: 0.9 mg/dL (ref 0.3–1.2)
Total Protein: 7.7 g/dL (ref 6.0–8.3)

## 2010-12-28 LAB — LIPID PANEL
Cholesterol: 220 mg/dL — ABNORMAL HIGH (ref 0–200)
HDL: 90 mg/dL (ref >39.00)
Total CHOL/HDL Ratio: 2
Triglycerides: 107 mg/dL (ref 0.0–149.0)
VLDL: 21.4 mg/dL (ref 0.0–40.0)

## 2010-12-28 LAB — LDL CHOLESTEROL, DIRECT: Direct LDL: 114.5 mg/dL

## 2010-12-28 NOTE — Assessment & Plan Note (Signed)
No recent palpitations.  EKG today shows no premature beats.

## 2010-12-28 NOTE — Progress Notes (Signed)
[name] Date of Birth:  Feb 04, 1941 [facility] / [facility] [name]   [address] [place], [place]  [address] [phone]           Fax   [phone]  History of Present Illness: Hasn't 70 year old Caucasian female is seen for a one-year physical examination.  She has been in good general health.  She has a past history of palpitations and benign PVCs.  These have not been causing her any symptoms recently.  She has a history of labile hypertension.  She had a history of cancer of the right breast in 2000 treated with lumpectomy and radiation therapy.  She also received postoperative tamoxifen.  In August of 2009 she was found to have endometrial cancer.  She underwent a total abdominal hysterectomy at [facility] which was done laparoscopically.  The ovaries were also removed.  She has been cancer free on both her cancers.  From a cardiovascular standpoint she walks regular for exercise.  She has not been expressing any chest pain.  She's had no dizziness or syncope.  Blood pressures at home have been in the normal range on low dose beta blocker.  She has a history of hypercholesterolemia and is on simvastatin  Current Outpatient Prescriptions  Medication Sig Dispense Refill  . Acetylcarnitine HCl (ACETYL-L-CARNITINE HCL) POWD daily.        . ALPHA-LIPOIC ACID 100 MG TABS Take 100 mg by mouth daily.        Marland Kitchen ALPRAZolam (XANAX) 0.25 MG tablet Take 0.25 mg by mouth as needed.        Marland Kitchen amLODipine (NORVASC) 5 MG tablet Take 1 tablet (5 mg total) by mouth daily.  90 tablet  1  . aspirin 81 MG tablet Take 81 mg by mouth daily.        Marland Kitchen atenolol (TENORMIN) 25 MG tablet Take 25 mg by mouth 2 (two) times daily.        . Calcium Carbonate-Vitamin D (CALCIUM 600+D PO) Take 1 tablet by mouth 3 (three) times a week.        . cholecalciferol (VITAMIN D) 1000 UNITS tablet Take 1,000 Units by mouth daily.        . Coenzyme Q10 (CO Q10) 100 MG TABS Take 100 mg elemental calcium/kg/hr  by mouth daily.        . fish oil-omega-3 fatty acids 1000 MG capsule Take 1 g by mouth daily.        . Magnesium 250 MG TABS Take 250 mg by mouth 3 (three) times a week.        . simvastatin (ZOCOR) 20 MG tablet Take 20 mg by mouth at bedtime.          Allergies  Allergen Reactions  . Ace Inhibitors Anaphylaxis  . Codeine   . Demerol     There is no problem list on file for this patient.   History  Smoking status  . Former Smoker  . Types: Cigarettes  . Quit date: 05/13/1962  Smokeless tobacco  . Never Used    History  Alcohol Use No    Family History  Problem Relation Age of Onset  . Thrombosis Father     coronary thrombosis  . Hypertension Father   . Heart failure Mother   . Coronary artery disease Brother     Review of Systems: Constitutional: no fever chills diaphoresis or fatigue or change in weight.  Head and neck: no hearing loss, no epistaxis, no  photophobia or visual disturbance. Respiratory: No cough, shortness of breath or wheezing. Cardiovascular: No chest pain peripheral edema, palpitations. Gastrointestinal: No abdominal distention, no abdominal pain, no change in bowel habits hematochezia or melena. Genitourinary: No dysuria, no frequency, no urgency, no nocturia. Musculoskeletal:No arthralgias, no back pain, no gait disturbance or myalgias. Neurological: No dizziness, no headaches, no numbness, no seizures, no syncope, no weakness, no tremors. Hematologic: No lymphadenopathy, no easy bruising. Psychiatric: No confusion, no hallucinations, no sleep disturbance.    Physical Exam: Filed Vitals:   12/28/10 1634  BP: 136/80  Pulse: 61  The general appearance reveals a well-developed well-nourished woman in no distress.Pupils equal and reactive.   Extraocular Movements are full.  There is no scleral icterus.  The mouth and pharynx are normal.  The neck is supple.  The carotids reveal no bruits.  The jugular venous pressure is normal.  The thyroid is  not enlarged.  There is no lymphadenopathy.The chest is clear to percussion and auscultation. There are no rales or rhonchi. Expansion of the chest is symmetrical.  The precordium is quiet.  The first heart sound is normal.  The second heart sound is physiologically split.  There is no murmur gallop rub or click.  There is no abnormal lift or heave.  The breasts reveal no massesThe abdomen is soft and nontender. Bowel sounds are normal. The liver and spleen are not enlarged. There Are no abdominal masses. There are no bruits.  The pedal pulses are good.  There is no phlebitis or edema.  There is no cyanosis or clubbing.  Strength is normal and symmetrical in all extremities.  There is no lateralizing weakness.  There are no sensory deficits.  The skin is warm and dry.  There is no rash.   EKG shows normal sinus rhythm and is within normal limits.  Her lab work from earlier today was reviewed and is satisfactory except for elevated potassium and we are stopping her oral potassium pills       Assessment / Plan:  Continue same medication except for potassium.  Recheck p.r.n. And she will be transitioning to an internist for her subsequent annual physicals

## 2010-12-28 NOTE — Assessment & Plan Note (Signed)
She remains on a low cholesterol diet.  Her LDL remains slightly high but her HDL is very high which mitigates her risk.  We will continue her same low dose simvastatin.  She's not having any myalgias or side effects.

## 2011-01-07 ENCOUNTER — Encounter (HOSPITAL_BASED_OUTPATIENT_CLINIC_OR_DEPARTMENT_OTHER): Payer: BC Managed Care – PPO | Admitting: Oncology

## 2011-01-07 ENCOUNTER — Other Ambulatory Visit: Payer: Self-pay | Admitting: Oncology

## 2011-01-07 DIAGNOSIS — C549 Malignant neoplasm of corpus uteri, unspecified: Secondary | ICD-10-CM

## 2011-01-07 DIAGNOSIS — Z853 Personal history of malignant neoplasm of breast: Secondary | ICD-10-CM

## 2011-01-07 DIAGNOSIS — G609 Hereditary and idiopathic neuropathy, unspecified: Secondary | ICD-10-CM

## 2011-01-07 DIAGNOSIS — Z1231 Encounter for screening mammogram for malignant neoplasm of breast: Secondary | ICD-10-CM

## 2011-01-08 ENCOUNTER — Encounter: Payer: Self-pay | Admitting: Cardiology

## 2011-01-08 ENCOUNTER — Other Ambulatory Visit: Payer: Self-pay | Admitting: Cardiology

## 2011-01-08 DIAGNOSIS — I119 Hypertensive heart disease without heart failure: Secondary | ICD-10-CM

## 2011-01-08 DIAGNOSIS — E785 Hyperlipidemia, unspecified: Secondary | ICD-10-CM

## 2011-01-08 MED ORDER — SIMVASTATIN 20 MG PO TABS: 20.0000 mg | ORAL_TABLET | Freq: Every day | ORAL | Status: DC

## 2011-01-08 MED ORDER — AMLODIPINE BESYLATE 5 MG PO TABS: 5.0000 mg | ORAL_TABLET | Freq: Every day | ORAL | Status: DC

## 2011-01-08 MED ORDER — ATENOLOL 25 MG PO TABS: 25.0000 mg | ORAL_TABLET | Freq: Two times a day (BID) | ORAL | Status: DC

## 2011-01-08 NOTE — Telephone Encounter (Signed)
Mailed to patient

## 2011-01-08 NOTE — Telephone Encounter (Signed)
Patient had a CPE about 2 wks ago.  Patient would like to pick up prescriptions for her three medications-written for three month supply for each.

## 2011-02-08 LAB — CREATININE, SERUM
Creatinine, Ser: 0.73
GFR calc Af Amer: >60
GFR calc non Af Amer: >60

## 2011-02-08 LAB — BUN: BUN: 14

## 2011-03-20 ENCOUNTER — Other Ambulatory Visit (HOSPITAL_COMMUNITY)
Admission: RE | Admit: 2011-03-20 | Discharge: 2011-03-20 | Disposition: A | Discharge status: Home / Self Care | Payer: BC Managed Care – PPO | Source: Ambulatory Visit | Attending: Gynecologic Oncology | Admitting: Gynecologic Oncology

## 2011-03-20 ENCOUNTER — Ambulatory Visit: Payer: BC Managed Care – PPO | Attending: Gynecologic Oncology | Admitting: Gynecologic Oncology

## 2011-03-20 ENCOUNTER — Ambulatory Visit: Payer: BC Managed Care – PPO

## 2011-03-20 ENCOUNTER — Encounter: Payer: Self-pay | Admitting: Gynecologic Oncology

## 2011-03-20 VITALS — BP 140/74 | HR 78 | Temp 97.8°F | Resp 16 | Ht 59.0 in | Wt 113.0 lb

## 2011-03-20 DIAGNOSIS — R143 Flatulence: Secondary | ICD-10-CM | POA: Insufficient documentation

## 2011-03-20 DIAGNOSIS — Z90710 Acquired absence of both cervix and uterus: Secondary | ICD-10-CM | POA: Insufficient documentation

## 2011-03-20 DIAGNOSIS — C549 Malignant neoplasm of corpus uteri, unspecified: Secondary | ICD-10-CM

## 2011-03-20 DIAGNOSIS — R142 Eructation: Secondary | ICD-10-CM | POA: Insufficient documentation

## 2011-03-20 DIAGNOSIS — R141 Gas pain: Secondary | ICD-10-CM | POA: Insufficient documentation

## 2011-03-20 DIAGNOSIS — Z01419 Encounter for gynecological examination (general) (routine) without abnormal findings: Secondary | ICD-10-CM | POA: Insufficient documentation

## 2011-03-20 DIAGNOSIS — C55 Malignant neoplasm of uterus, part unspecified: Secondary | ICD-10-CM

## 2011-03-20 LAB — CREATININE, SERUM: Creatinine, Ser: 0.67 mg/dL (ref 0.50–1.10)

## 2011-03-20 LAB — BUN: BUN: 21 mg/dL (ref 6–23)

## 2011-03-20 NOTE — Patient Instructions (Signed)
I will call you with the results of your CT scan.

## 2011-03-20 NOTE — Progress Notes (Signed)
Consult Note: Gyn-Onc  Dennie Maizes 70 y.o. female  CC:  Chief Complaint  Patient presents with  . Follow-up    Endo cancer    HPI: Is a very pleasant 70 year old with history of stage IB uterine papillary serous carcinoma. She underwent a laparoscopic hysterectomy and appropriate staging  2009. Pathology revealed a papillary serous carcinoma with 36% myometrial invasion, 15 negative lymph nodes. She did have lymphovascular space involvement. Preoperative CT scan was negative and her CA 125 at the time of diagnosis was normal at 7.1. Postop really should receive 6 cycles of paclitaxel and carboplatin-based chemotherapy with vaginal cuff brachytherapy. Her last cycle of chemotherapy was in January of 2010 inches a negative posttreatment CT scan. I last saw her in April of 2012 at which time her exam and Pap smear were unremarkable. She is similarly had follow up with Dr. Darrold Span as well as with Dr. Roselind Messier.  She was recently saw Dr. Darrold Span in August of 2012. Interval History:   Review of Systems: She is doing fairly well she does admit that during the summer of 2012 please begin to feel abdominal distension she was having increasing bloating. She was seen by her gastroenterologist that she might have irritable bowel syndrome. She did have a colonoscopy last week which was negative and it was quite reassuring to her. She states that symptoms are not getting any worse. They are intermittent. She does notice increasing gas and flatulence with the bloating. There's no changes in her bowel movements, any nausea vomiting, there is no abdominal pain and the pain is not sustained. She denies vaginal bleeding, chest pain shortness of breath, nausea, vomiting, fevers or chills. She denies any change in her appetite or any early satiety. She's had change in medications or supplements.  Current Meds:  Outpatient Encounter Prescriptions as of 03/20/2011  Medication Sig Dispense Refill  . Acetylcarnitine HCl  (ACETYL-L-CARNITINE HCL) POWD daily.        . ALPHA-LIPOIC ACID 100 MG TABS Take 100 mg by mouth daily.        Marland Kitchen ALPRAZolam (XANAX) 0.25 MG tablet Take 0.25 mg by mouth as needed.        Marland Kitchen amLODipine (NORVASC) 5 MG tablet Take 1 tablet (5 mg total) by mouth daily.  90 tablet  3  . aspirin 81 MG tablet Take 81 mg by mouth daily.        Marland Kitchen atenolol (TENORMIN) 25 MG tablet Take 1 tablet (25 mg total) by mouth 2 (two) times daily.  180 tablet  3  . Calcium Carbonate-Vitamin D (CALCIUM 600+D PO) Take 1 tablet by mouth 3 (three) times a week.        . cholecalciferol (VITAMIN D) 1000 UNITS tablet Take 1,000 Units by mouth daily.        . Coenzyme Q10 (CO Q10) 100 MG TABS Take 100 mg elemental calcium/kg/hr by mouth daily.        . fish oil-omega-3 fatty acids 1000 MG capsule Take 1 g by mouth daily.        . Magnesium 250 MG TABS Take 250 mg by mouth 3 (three) times a week.        . simvastatin (ZOCOR) 20 MG tablet Take 1 tablet (20 mg total) by mouth at bedtime.  90 tablet  3    Allergy:  Allergies  Allergen Reactions  . Ace Inhibitors Anaphylaxis  . Codeine   . Demerol     Social Hx:   History  Social History  . Marital Status: Married    Spouse Name: N/A    Number of Children: N/A  . Years of Education: N/A   Occupational History  . Not on file.   Social History Main Topics  . Smoking status: Former Smoker    Types: Cigarettes    Quit date: 05/13/1962  . Smokeless tobacco: Never Used  . Alcohol Use: Not on file  . Drug Use: No  . Sexually Active: No   Other Topics Concern  . Not on file   Social History Narrative  . No narrative on file    Past Surgical Hx:  Past Surgical History  Procedure Date  . Total abdominal hysterectomy w/ bilateral salpingoophorectomy   . Breast lumpectomy   . Appendectomy   . Tonsillectomy   . Foot surgery     RIGHT    Past Medical Hx:  Past Medical History  Diagnosis Date  . Hypertension   . Palpitations   . History of breast  cancer 2000    right breast -- treated with lumpectomy and radiation therarpy, postoperatively with tamoxifen   . Endometrial cancer 12/2007    s/p total abdominal hysterectomy at [facility] - ovaries were also removed   . Hyperlipidemia   . Hypercholesterolemia   . PVC's (premature ventricular contractions)     Family Hx:  Family History  Problem Relation Age of Onset  . Thrombosis Father     coronary thrombosis  . Hypertension Father   . Heart failure Mother   . Coronary artery disease Brother     Vitals:  Blood pressure 140/74, pulse 78, temperature 97.8 F (36.6 C), resp. rate 16, height 4\' 11"  (1.499 m), weight 113 lb (51.256 kg).  Physical Exam: Well-nourished well-developed female in no acute distress.  Neck is supple there's no lymphadenopathy no thyromegaly.  Lungs are clear to auscultation.  Cardiovascular examination regular rate and rhythm.  Abdomen shows well-healed surgical incisions. Abdomen is soft nontender and nondistended no palpable masses or hepatosplenomegaly. There is a sense of fullness in the left upper quadrant is nondescript and could be consistent with stool in the colon. There is no rebound or guarding.  Groins negative adenopathy.  Extremities there is no edema.  Pelvic: External genitalia within normal limits. The vagina is markedly atrophic. The vaginal cuff has no lesions. Pap smear was submitted without difficulty. Bimanual examination reveals no masses or nodularity. Rectovaginal exam confirms     Assessment/Plan: 70 year old with a history of a stage IB uterine papillary serous carcinoma diagnosed and treated 3 years ago clinically has no evidence of recurrent disease but does have some nondescript abdominal symptoms have been persistent since the summer. She is concerned and I do think it's reasonable based on her high risk of recurrence to proceed with abdominal imaging. Will get her scheduled for CT scan of the abdomen and pelvis. We'll  check renal function. She does we will call her with the results of the CT scan and disposition her based on them. If her CT scan is negative she'll return to see me in 6 months.   Kelyse Pask A., MD 03/20/2011, 4:14 PM

## 2011-03-22 ENCOUNTER — Ambulatory Visit (HOSPITAL_COMMUNITY)
Admission: RE | Admit: 2011-03-22 | Discharge: 2011-03-22 | Disposition: A | Discharge status: Home / Self Care | Payer: BC Managed Care – PPO | Source: Ambulatory Visit | Attending: Gynecologic Oncology | Admitting: Gynecologic Oncology

## 2011-03-22 DIAGNOSIS — C55 Malignant neoplasm of uterus, part unspecified: Secondary | ICD-10-CM | POA: Insufficient documentation

## 2011-03-22 MED ORDER — IOHEXOL 300 MG/ML  SOLN
100.0000 mL | Freq: Once | INTRAMUSCULAR | Status: AC | PRN
  Administered 2011-03-22: 100 mL via INTRAVENOUS

## 2011-03-26 ENCOUNTER — Telehealth (HOSPITAL_COMMUNITY): Payer: Self-pay | Admitting: Oncology

## 2011-03-27 ENCOUNTER — Telehealth: Payer: Self-pay | Admitting: *Deleted

## 2011-03-27 NOTE — Telephone Encounter (Signed)
Notified patient that the pap smear that was taken on 03/20/2011 by Dr. Gehrig was negative, there were no signs of cancer.  

## 2011-03-28 NOTE — Telephone Encounter (Signed)
Error, no encounter.

## 2011-04-09 ENCOUNTER — Encounter: Payer: Self-pay | Admitting: Gynecologic Oncology

## 2011-04-09 ENCOUNTER — Ambulatory Visit: Payer: BC Managed Care – PPO | Attending: Gynecologic Oncology | Admitting: Gynecologic Oncology

## 2011-04-09 VITALS — BP 160/80 | HR 88 | Temp 98.2°F | Resp 20 | Ht 59.0 in | Wt 111.4 lb

## 2011-04-09 DIAGNOSIS — I10 Essential (primary) hypertension: Secondary | ICD-10-CM | POA: Insufficient documentation

## 2011-04-09 DIAGNOSIS — Z9079 Acquired absence of other genital organ(s): Secondary | ICD-10-CM | POA: Insufficient documentation

## 2011-04-09 DIAGNOSIS — Z853 Personal history of malignant neoplasm of breast: Secondary | ICD-10-CM | POA: Insufficient documentation

## 2011-04-09 DIAGNOSIS — C549 Malignant neoplasm of corpus uteri, unspecified: Secondary | ICD-10-CM

## 2011-04-09 DIAGNOSIS — Z79899 Other long term (current) drug therapy: Secondary | ICD-10-CM | POA: Insufficient documentation

## 2011-04-09 DIAGNOSIS — C779 Secondary and unspecified malignant neoplasm of lymph node, unspecified: Secondary | ICD-10-CM | POA: Insufficient documentation

## 2011-04-09 DIAGNOSIS — Z8544 Personal history of malignant neoplasm of other female genital organs: Secondary | ICD-10-CM | POA: Insufficient documentation

## 2011-04-09 DIAGNOSIS — E785 Hyperlipidemia, unspecified: Secondary | ICD-10-CM | POA: Insufficient documentation

## 2011-04-09 DIAGNOSIS — E7800 Pure hypercholesterolemia, unspecified: Secondary | ICD-10-CM | POA: Insufficient documentation

## 2011-04-09 DIAGNOSIS — Z90710 Acquired absence of both cervix and uterus: Secondary | ICD-10-CM | POA: Insufficient documentation

## 2011-04-09 NOTE — Progress Notes (Signed)
Consult Note: Gyn-Onc  [name] 70 y.o. female  CC:  Chief Complaint  Patient presents with  . Follow-up    discuss treatment options    HPI: This is a very pleasant 71 year old with history of stage IB uterine papillary serous carcinoma. She underwent a laparoscopic hysterectomy and appropriate staging 2009. Pathology revealed a papillary serous carcinoma with 36% myometrial invasion, 15 negative lymph nodes. She did have lymphovascular space involvement. Preoperative CT scan was negative and her CA 125 at the time of diagnosis was normal at 7.1. Postop really should receive 6 cycles of paclitaxel and carboplatin-based chemotherapy with vaginal cuff brachytherapy. Her last cycle of chemotherapy was in January of 2010 inches a negative posttreatment CT scan. I last saw her in April of 2012 at which time her exam and Pap smear were unremarkable. She is similarly had follow up with Dr. Darrold Span as well as with Dr. Roselind Messier. She was recently saw Dr. Darrold Span in August of 2012 and I saw her several weeks ago. At that time, she was complaining of some early satiety and abdominal discomfort since the summer appear  Interval History: She has a CT scan done that revealed recurrent disease along the left obturator node chain. One set of lymph nodes measured 3 x 2 cm and the other area measured 2 x 2 centimeters. There is no peritoneal disease, there is no evidence of any other abdominal disease.   Review of Systems: No change.  Current Meds:  Outpatient Encounter Prescriptions as of 04/09/2011  Medication Sig Dispense Refill  . Acetylcarnitine HCl (ACETYL-L-CARNITINE HCL) POWD daily.        . ALPHA-LIPOIC ACID 100 MG TABS Take 100 mg by mouth daily.        Marland Kitchen ALPRAZolam (XANAX) 0.25 MG tablet Take 0.25 mg by mouth as needed.        Marland Kitchen amLODipine (NORVASC) 5 MG tablet Take 1 tablet (5 mg total) by mouth daily.  90 tablet  3  . aspirin 81 MG tablet Take 81 mg by mouth daily.        Marland Kitchen atenolol  (TENORMIN) 25 MG tablet Take 1 tablet (25 mg total) by mouth 2 (two) times daily.  180 tablet  3  . Calcium Carbonate-Vitamin D (CALCIUM 600+D PO) Take 1 tablet by mouth 3 (three) times a week.        . cholecalciferol (VITAMIN D) 1000 UNITS tablet Take 1,000 Units by mouth daily.        . Coenzyme Q10 (CO Q10) 100 MG TABS Take 100 mg elemental calcium/kg/hr by mouth daily.        . fish oil-omega-3 fatty acids 1000 MG capsule Take 1 g by mouth daily.        . Magnesium 250 MG TABS Take 250 mg by mouth 3 (three) times a week.        . simvastatin (ZOCOR) 20 MG tablet Take 1 tablet (20 mg total) by mouth at bedtime.  90 tablet  3    Allergy:  Allergies  Allergen Reactions  . Ace Inhibitors Anaphylaxis  . Codeine   . Demerol     Social Hx:   History   Social History  . Marital Status: Married    Spouse Name: N/A    Number of Children: N/A  . Years of Education: N/A   Occupational History  . Not on file.   Social History Main Topics  . Smoking status: Former Smoker    Types: Cigarettes  Quit date: 05/13/1962  . Smokeless tobacco: Never Used  . Alcohol Use: Not on file  . Drug Use: No  . Sexually Active: No   Other Topics Concern  . Not on file   Social History Narrative  . No narrative on file    Past Surgical Hx:  Past Surgical History  Procedure Date  . Total abdominal hysterectomy w/ bilateral salpingoophorectomy   . Breast lumpectomy   . Appendectomy   . Tonsillectomy   . Foot surgery     RIGHT    Past Medical Hx:  Past Medical History  Diagnosis Date  . Hypertension   . Palpitations   . History of breast cancer 2000    right breast -- treated with lumpectomy and radiation therarpy, postoperatively with tamoxifen   . Endometrial cancer 12/2007    s/p total abdominal hysterectomy at [facility] - ovaries were also removed   . Hyperlipidemia   . Hypercholesterolemia   . PVC's (premature ventricular contractions)     Family Hx:  Family History    Problem Relation Age of Onset  . Thrombosis Father     coronary thrombosis  . Hypertension Father   . Heart failure Mother   . Coronary artery disease Brother     Vitals:  Blood pressure 160/80, pulse 88, temperature 98.2 F (36.8 C), temperature source Oral, resp. rate 20, height 4\' 11"  (1.499 m), weight 111 lb 6.4 oz (50.531 kg).  Physical Exam: Well-nourished well-developed female in no acute distress.   Assessment/Plan: This is a very pleasant 70 year old with a history of stage IB uterine papillary serous carcinoma who underwent appropriate staging 2009. Postoperatively she received 6 cycles of paclitaxel and carboplatin-based chemotherapy with vaginal cuff brachytherapy. She had a posttreatment CT scan that was negative. Most recently CT scan revealed recurrent disease in the lymph node chain on the left side.  She comes in with her husband to discuss these findings and to develop a treatment plan. As she's had a 3 year remission albeit is not as long as all of [place] would've liked, I believe we should treat her aggressively and she is very much in favor of this. I would like to proceed with robotic-assisted lymph node resection of these enlarged lymph nodes on the left side. If there is no evidence of extracapsular extension we could consider just treating her with postoperative adjuvant pelvic radiation to left side if there are features consistent with extracapsular extension or other evidence of abdominal disease and proceed with chemotherapy. They understand that we will be able to make this determination until the time of surgery or at the time of final pathology. They would like to have the surgery done as soon as possible. They're willing to have the surgery and Kendell Bane and she is tentatively scheduled for surgery December 13 at [facility]. Risks and benefits of the procedure were discussed with the patient. She stay she is well aware of these areas are very similar to those we  discussed at the time of her hysterectomy 2009.  She will be contacted by our surgery schedulers with a preoperative visit. Her questions were elicited and answered to their satisfaction. She knows to call me if she has any other questions.    Cleda Mccreedy A., MD 04/09/2011, 5:21 PM

## 2011-04-09 NOTE — Patient Instructions (Signed)
Cancer of the Uterus The uterus is part of a woman's reproductive system. It is the hollow, pear-shaped organ where a baby grows. The uterus is in the pelvis between the bladder and the rectum. The narrow, lower portion of the uterus is the cervix. The fallopian tubes extend from either side of the top of the uterus to the ovaries. The wall of the uterus has two layers of tissue. The inner layer, or lining, is the endometrium. The outer layer is muscle tissue called the myometrium. In women of childbearing age, the lining of the uterus grows and thickens each month to prepare for pregnancy. If a woman does not become pregnant, the thick, bloody lining flows out of the body through the vagina. This flow is called menstruation. TYPES OF UTERINE CANCER  The most common type of cancer of the uterus begins in the lining (endometrium). It is called endometrial cancer, uterine cancer, or cancer of the uterus. It is seen in 2% to 3% of women.     A different type of cancer, uterine sarcoma, develops in the muscle (myometrium). Cancer that begins in the cervix is also a different type of cancer.     Rarely, a noncancerous fibroid tumor of the uterus develops into a sarcoma.  CAUSES   No one knows the exact causes of uterine cancer. But it is clear that this disease is not contagious. No one can "catch" cancer from another person. Women who get this disease are more likely than other women to have certain risk factors. A risk factor is something that increases a person's chance of developing the disease.  Most women who have known risk factors do not get uterine cancer. On the other hand, many who do get this disease have none of these factors. Doctors can seldom explain why one woman gets uterine cancer and another does not.     Studies have found the following risk factors:     Age. Cancer of the uterus occurs mostly in women over age 22.     Endometrial hyperplasia (enlarged endometrium). The risk of  uterine cancer is higher if a woman has endometrial hyperplasia.     Hormone replacement therapy (HRT). HRT is used to control the symptoms of menopause, to prevent osteoporosis (thinning of the bones), and to reduce the risk of heart disease or stroke. Women who still have their uterus, and use estrogen without progesterone, have an increased risk of uterine cancer. Long-term use and large doses of estrogen seem to increase this risk. Women who use a combination of estrogen and progesterone have a lower risk of uterine cancer than women who use estrogen alone. The progesterone protects the uterus from developing cancer.     Obesity and related conditions. The body stores and releases some of its estrogen in fatty tissue. That is why obese women are more likely than thin women to have higher levels of estrogen in their bodies. High levels of estrogen may be the reason that obese women have an increased risk of developing uterine cancer. The risk of this disease is also higher in women with diabetes or high blood pressure. These conditions occur in many obese women.     Tamoxifen. Women taking the drug tamoxifen to prevent or treat breast cancer have an increased risk of uterine cancer. This risk appears to be related to the estrogen-like effect of this drug on the uterus.     Race. White women are more likely than African-American women to get uterine cancer.  Colorectal cancer. Women who have had an inherited form of colorectal cancer have a higher risk of developing uterine cancer than other women.     Infertility.    Beginning menstrual periods before age 68.     Having menstrual periods after age 3.     History of cancer of the ovary or intestine.     Family history of uterine cancer.     Having diabetes, high blood pressure, thyroid or gallbladder disease.     Long-term use of high does of birth control pills. Birth control pills today are low in hormone doses.     Radiation to the  abdomen or pelvis.     Smoking.  SYMPTOMS  Uterine cancer usually occurs after menopause. But it may also occur around the time that menopause begins. Abnormal vaginal bleeding is the most common symptom of uterine cancer. Bleeding may start as a watery, blood-streaked flow that gradually contains more blood. Women should not assume that abnormal vaginal bleeding is part of menopause. A woman should see her caregiver if she has any of the following symptoms:  Unusual vaginal bleeding or discharge.     Difficult or painful urination.     Pain during intercourse.     Pain in the pelvic area.     Increased girth (growth) of the stomach.     Any vaginal bleeding after menopause.     Unexplained weight loss.  These symptoms can be caused by cancer or other less serious conditions. Most often they are not cancer. But a thorough evaluation is needed to be certain. DIAGNOSIS   If a woman has symptoms that suggest uterine cancer, her caregiver may check her general health and may order blood and urine tests. The caregiver also may perform one or more of these exams or tests.  Blood and urine tests and chest x-rays. The woman also may have:     Other X-rays.     CT scans.     Ultrasound test.     Magnetic resonance imaging (MRI).     Sigmoidoscopy.    Colonoscopy.    Pelvic exam. A woman will have a pelvic exam to check the vagina, uterus, bladder, and rectum. The caregiver feels these organs for any lumps or changes in their shape or size. To see the upper part of the vagina and the cervix, the caregiver inserts an instrument called a speculum into the vagina.     Pap test. The caregiver collects cells from the cervix and upper vagina. A medical laboratory checks for abnormal cells. The Pap test is better for detecting cancer of the cervix. But cells from inside the uterus usually do not show up on a Pap test. It is not a reliable test for uterine cancer.     Transvaginal ultrasound.  The medical caregiver inserts an instrument into the vagina. The instrument aims high-frequency sound waves at the uterus. The pattern of the echoes they produce creates a picture. If the endometrium looks too thick, the caregiver can do a biopsy.     Biopsy. The medical caregiver removes a sample of tissue from the uterine lining. This usually can be done in the caregiver's office.     Dilatation and Curettage (D&C). In some cases, a woman may need to have a D&C. D&C is usually done as same-day surgery with anesthesia in a hospital. A pathologist examines the tissue (lining of the uterus) to check for cancer cells and other conditions.  STAGING  If uterine cancer is diagnosed, the caregiver needs to know the stage, or extent, of the disease to plan the best treatment. Staging is a careful attempt to find out whether the cancer has spread, and if so, to what parts of the body.     When uterine cancer spreads (metastasizes) outside the uterus, cancer cells are often found in nearby lymph nodes, nerves, or blood vessels. If the cancer has reached the lymph nodes, cancer cells may have spread to other lymph nodes and other organs of the body.     Staging is done at the time of surgery. In most cases, the most reliable way to stage this disease is to remove the uterus, cervix, tubes, ovaries, and lymph nodes. A pathologist uses a microscope to examine the uterus and other tissues removed by the surgeon, to determine the extent of the cancer in the pelvis.     If lymph nodes have cancer cells, other parts of the body are examined, to see if it has spread to other organs.  MAIN FEATURES OF EACH STAGE OF THE DISEASE: Stage I. The cancer is only in the body of the uterus. It is not in the cervix. Stage II. The cancer has spread from the body of the uterus to the cervix. Stage III. The cancer has spread outside the uterus, but not outside the pelvis (and not to the bladder or rectum). Lymph nodes in the  pelvis may contain cancer cells. Stage IV. The cancer has spread into the bladder or rectum. It may have spread beyond the pelvis to other body parts. TREATMENT   Women with uterine cancer have many treatment options. Most women with uterine cancer are treated with surgery. Some have radiation or chemotherapy. A smaller number of women may be treated with hormonal therapy. Some patients receive a combination of therapies. You may want to consult with another cancer doctor for a second opinion. The caregiver (usually a cancer doctor) is the best person to describe your treatment choices and to discuss the expected results of treatment. SURGERY  Most women with uterine cancer have surgery to remove the uterus, cervix, tubes, and ovaries (total hysterectomy). This is usually done through an incision in the abdomen.     The doctor may also remove the lymph nodes near the tumor, to see if they contain cancer. If cancer cells have reached the lymph nodes, it may mean that the disease has spread to other parts of the body. If cancer cells have not spread beyond the endometrium, the woman may not need to have any other treatment. The length of the hospital stay may vary from several days to a week.  RADIATION THERAPY  In radiation therapy, high-energy rays are used to kill cancer cells. Like surgery, radiation therapy is a local therapy. It affects cancer cells only in the treated area.     Some women with Stage I, II, or III uterine cancer need both radiation therapy and surgery. They may have radiation before surgery to shrink the tumor, or after surgery to destroy any cancer cells that remain in the area. The doctor may suggest radiation treatments for the small number of women who cannot have surgery.     Doctors use two types of radiation therapy to treat uterine cancer:     External radiation. In external radiation therapy, a large machine outside the body is used to aim radiation at the tumor area.  The woman usually does not stay overnight (outpatient) at the hospital  or clinic, and receives external radiation 5 days a week for several weeks. This schedule helps protect healthy cells and tissue by spreading out the total dose of radiation. No radioactive materials are put into the body for external radiation therapy.     Internal radiation. In internal radiation therapy, tiny tubes containing a radioactive substance are inserted through the vagina and cervix, into the uterus, and left in place for a few days. The woman stays in the hospital during this treatment. To protect others from radiation exposure, the patient may not be able to have visitors or may have visitors only for a short period of time while the implant is in place. Once the implant is removed, the woman has no radioactivity in her body.     Some patients need both external and internal radiation therapies.  CHEMOTHERAPY Chemotherapy is not usually used for endometrial cancer of the uterus. However, with sarcoma of the uterus or of the fibroid, it may be used in combination with surgery. Chemotherapy may also be used with recurring sarcoma, and in patients who cannot have surgery. HORMONE THERAPY Hormonal therapy involves substances that prevent cancer cells from multiplying or growing by attaching to hormone receptors. This causes changes in cancer cells. Before therapy begins, the caregiver may request a hormone receptor test. This special lab test of uterine tissue helps the caregiver learn if estrogen and progesterone receptors are present. If the tissue has receptors, the woman is more likely to respond to hormonal therapy.  Hormonal therapy is called a systemic therapy, because it can affect cancer cells throughout the body. Usually, hormonal therapy is a type of progesterone, taken as a pill or injection.     The doctor may use hormonal therapy for women with uterine cancer who are unable to have surgery or radiation therapy.  Also, the doctor may give hormonal therapy to women with uterine cancer that has spread to the lungs or other distant sites. It is also given to women with uterine cancer that has come back.     Hormonal therapy can cause a number of side effects. Women taking progesterone may retain fluid, have an increased appetite, and gain weight. Women who are still menstruating may have changes in their periods.     Hormone therapy can be used in combination with surgery or radiation.  HOME CARE INSTRUCTIONS    Maintain a normal weight with a healthy balanced diet and exercise.     If you have diabetes, high blood pressure, thyroid or gallbladder disease, keep them in control with your caregiver's treatment and recommendations.     Do not smoke.     Do not take estrogen without taking progesterone with it, for menopausal symptoms.     Join a support group or get counseling, if you would like help dealing with your cancer.     If you are on hormone replacement therapy, see your caregiver as recommended, and be informed about the side effects of HRT.     Women with known risk factors should ask their caregiver what symptoms to look for and how often they should have an examination.     Keep your follow-up appointments and take your medicines as advised.     Write your questions down, and take them with you to your caregiver's appointments.     You may want another person to be with you for your appointments, so you do not miss any instructions.  SEEK MEDICAL CARE IF:    You  have any abnormal vaginal bleeding.     You are having menstrual periods at the age of 87 or older.     You have bleeding after sexual intercourse.     You are taking tomoxifen and develop vaginal bleeding.     Your stomach is growing, and you are not pregnant.     You have pain with sexual intercourse.     You have stomach or pelvis pain.     You have weight loss for no known reason.     You have pain or difficulty  with urination.  [facility]   Cancer Information Service (CIS) provides accurate, up-to-date information on cancer to patients and their families, health professionals, and the general public:  Phone: 1-800-4-CANCER ((786)733-9689).     Internet: http://www.cancer.gov  NCI's website contains complete information about cancer causes and prevention, screening and diagnosis, treatment and survivorship, clinical trials, statistics, funding, training, and employment opportunities, and [employer] and its programs. CLINICAL TRIALS A woman who is interested in being part of a clinical trial should talk with her caregiver. NCI's website (http://www.johnson-fowler.biz/) provides general information about clinical trials. It also offers detailed information about specific ongoing studies of uterine cancer by linking to PDQ, a cancer information database developed by the NCI. The Cancer Information Service at 1-800-4-CANCER can answer questions about cancer and provide information from the PDQ database. Document Released: 04/29/2005 Document Revised: 01/09/2011 Document Reviewed: 03/02/2009 [facility] Patient Information 2012 [place], [place].

## 2011-05-02 ENCOUNTER — Telehealth: Payer: Self-pay | Admitting: *Deleted

## 2011-05-02 NOTE — Telephone Encounter (Signed)
ON 04/25/11 PT. HAD SURGERY IN [place]. HER SURGEON, DR.GEHRIG, INFORMED PT. SHE WOULD START CHEMO ON 05/14/10. PT. IS GOING OUT OF TOWN FOR A FEW DAYS AND WANTED TO SEE IF CHEMO WAS SCHEDULED FOR 05/14/10. PT. WILL CHECK WITH DR.GEHRING'S NURSE, Lind Covert. CONCERNING CHEMO START DATE. THIS NOTE TO DR.LIVESAY'S DESK.

## 2011-05-03 ENCOUNTER — Encounter: Payer: Self-pay | Admitting: Cardiology

## 2011-05-03 ENCOUNTER — Telehealth: Payer: Self-pay

## 2011-05-03 ENCOUNTER — Telehealth: Payer: Self-pay | Admitting: Oncology

## 2011-05-03 ENCOUNTER — Other Ambulatory Visit: Payer: Self-pay | Admitting: Oncology

## 2011-05-03 DIAGNOSIS — C549 Malignant neoplasm of corpus uteri, unspecified: Secondary | ICD-10-CM

## 2011-05-03 NOTE — Telephone Encounter (Signed)
Per tiffany (nurse) LL lmonvm for pt giving her several options for lb/fu appt. pt called back and would like to do 1/2 which is a call day @ 11 am for lb and 11:30 for LL (option also in 12/21 ele[facility] pof). Added lb f/u for above d/t and michelle added chemo for 1/3. Per tiffany she has given pt both appt d/t's.

## 2011-05-03 NOTE — Telephone Encounter (Signed)
Called pt to inform her of appt's, per Dr. Darrold Span.  Pt states she will be out of town until 05/14/11.  Informed her Dr. Cleophas Dunker would like to see her before chemo tx, on 05/15/11 at 11 for lab, 1130 for MD visit, with chemo tx 05/16/11 at 1115.  Pt asked about prescription for steroids night before chemo.  Informed her this can be called in day of MD visit.  She verbalizes understanding of appt's.

## 2011-05-03 NOTE — Progress Notes (Signed)
Received message from switchboard that Dagmar Hait wanted to inform RN with Dr. Darrold Span that pt needs prescription for chemo starting 05/15/11.  Note to MD's desk for review.

## 2011-05-03 NOTE — Telephone Encounter (Signed)
Per triage, pt called 05-02-2011, having had surgery at [facility] 04-25-11 and expecting to start chemo "Jan 2". Emailed Dr.Paola Gehrig then spoke with her directly --  (She had emailed me last week with [employer], which apparently was not allowed thru on this end).  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 Dr.Gehrig would like 3 cycles taxol/carbo then if good response might sandwich RT in prior to additional 3 cycles, vs all chemo then RT.  Per patient's message 12-20 she is "going out of town for a few days", tho she was at work this am per Dr.Gehrig, who had spoken with husband at home also this am. I did not reach her at either home or work #s now, LM both places. Per [facility] infusion, taxol/carbo can be done Jan 3 @ 1115. Message sent to schedulers for appt to LL 12-26 in AM of call day, or 12-28 4pm, or 1-2 call cay @ 1130, lab day of MD.

## 2011-05-15 ENCOUNTER — Other Ambulatory Visit: Payer: Self-pay

## 2011-05-15 ENCOUNTER — Ambulatory Visit: Payer: BC Managed Care – PPO

## 2011-05-15 ENCOUNTER — Ambulatory Visit: Payer: BC Managed Care – PPO | Admitting: Oncology

## 2011-05-15 ENCOUNTER — Other Ambulatory Visit: Payer: Self-pay | Admitting: Oncology

## 2011-05-15 ENCOUNTER — Other Ambulatory Visit (HOSPITAL_BASED_OUTPATIENT_CLINIC_OR_DEPARTMENT_OTHER): Payer: BC Managed Care – PPO | Admitting: Lab

## 2011-05-15 VITALS — BP 150/80 | HR 67 | Temp 97.7°F | Ht 59.0 in

## 2011-05-15 DIAGNOSIS — C549 Malignant neoplasm of corpus uteri, unspecified: Secondary | ICD-10-CM

## 2011-05-15 DIAGNOSIS — C541 Malignant neoplasm of endometrium: Secondary | ICD-10-CM | POA: Insufficient documentation

## 2011-05-15 LAB — CBC WITH DIFFERENTIAL/PLATELET
BASO%: 0.7 % (ref 0.0–2.0)
Basophils Absolute: 0 10*3/uL (ref 0.0–0.1)
EOS%: 6.6 % (ref 0.0–7.0)
Eosinophils Absolute: 0.4 10*3/uL (ref 0.0–0.5)
HCT: 35.5 % (ref 34.8–46.6)
HGB: 11.9 g/dL (ref 11.6–15.9)
LYMPH%: 25.4 % (ref 14.0–49.7)
MCH: 32.1 pg (ref 25.1–34.0)
MCHC: 33.6 g/dL (ref 31.5–36.0)
MCV: 95.5 fL (ref 79.5–101.0)
MONO#: 0.6 10*3/uL (ref 0.1–0.9)
MONO%: 9.7 % (ref 0.0–14.0)
NEUT#: 3.8 10*3/uL (ref 1.5–6.5)
NEUT%: 57.6 % (ref 38.4–76.8)
Platelets: 208 10*3/uL (ref 145–400)
RBC: 3.72 10*6/uL (ref 3.70–5.45)
RDW: 13.4 % (ref 11.2–14.5)
WBC: 6.7 10*3/uL (ref 3.9–10.3)
lymph#: 1.7 10*3/uL (ref 0.9–3.3)

## 2011-05-15 LAB — COMPREHENSIVE METABOLIC PANEL
ALT: 12 U/L (ref 0–35)
AST: 24 U/L (ref 0–37)
Albumin: 4.1 g/dL (ref 3.5–5.2)
Alkaline Phosphatase: 39 U/L (ref 39–117)
BUN: 17 mg/dL (ref 6–23)
CO2: 26 mEq/L (ref 19–32)
Calcium: 9.6 mg/dL (ref 8.4–10.5)
Chloride: 106 mEq/L (ref 96–112)
Creatinine, Ser: 0.77 mg/dL (ref 0.50–1.10)
Glucose, Bld: 90 mg/dL (ref 70–99)
Potassium: 4.3 mEq/L (ref 3.5–5.3)
Sodium: 140 mEq/L (ref 135–145)
Total Bilirubin: 0.7 mg/dL (ref 0.3–1.2)
Total Protein: 6.4 g/dL (ref 6.0–8.3)

## 2011-05-15 LAB — URINALYSIS, MICROSCOPIC - CHCC
Bilirubin (Urine): NEGATIVE
Glucose: NEGATIVE g/dL
Ketones: 5 mg/dL
Nitrite: NEGATIVE
Protein: NEGATIVE mg/dL
Specific Gravity, Urine: 1.01 (ref 1.003–1.035)
pH: 5 (ref 4.6–8.0)

## 2011-05-15 MED ORDER — LORAZEPAM 0.5 MG PO TABS: ORAL_TABLET | ORAL | Status: DC

## 2011-05-15 MED ORDER — ONDANSETRON HCL 8 MG PO TABS: ORAL_TABLET | ORAL | Status: DC

## 2011-05-15 MED ORDER — DEXAMETHASONE 4 MG PO TABS: ORAL_TABLET | ORAL | Status: DC

## 2011-05-15 NOTE — Patient Instructions (Signed)
Decadron (dexamethasone) 20 mg with food 11 pm tonight and 5AM with food tomorrow, = 12 hrs and 6 hrs before chemotherapy  Fill prescriptions for decadron and zofran; you can fill the ativan (lorazepam) if you want (also for nausea)  Begin stool softner tonight, then can start laxative in addition to stool softner daily beginning tomorrow until bowels are moving well after chemo.  We will check urine today or tomorrow to be sure no infection.  You will have carboplatin skin test before each chemo. (we start using this with cycle 7 of carboplatin in case allergy develops)

## 2011-05-15 NOTE — Progress Notes (Signed)
OFFICE PROGRESS NOTE Date of Visit May 15, 2011 Physicians: P.Gehrig, (T.Brackbill), J.Kinard  INTERVAL HISTORY:  Patient is seen back at request of Dr.Paola Duard Brady, alone for visit today, now with recurrent endometrial carcinoma with adenopathy encasing left obturator nerve, for additional chemotherapy.  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 She had 6 cycles of taxol/carboplatin through Jan 2010, and vaginal cuff brachytherapy. She did well until some vague LUQ symptoms in Nov. 2012, with CT AP in [facility] system Mar 22, 2011 showing left pelvic sidewall involvement. She was taken to laparoscopic evaluation at [facility] by Dr.Gehrig 04-25-2011, with finding of dense fibrosis in the area as well as mass encasing obturator nerve and vein as well as internal iliac vasculature; left ureterolysis was performed. No pathology was submitted from that procedure. Patient was discharged home the following day and is presently scheduled back to Dr.Gehrig on Jan 31.Dr. Duard Brady has discussed with me by phone prior to this visit, recommending that we treat again with taxol/carboplatin, restage after 3 cycles and consider RT either at that point or after full 6 cycles of chemotherapy. Mrs.[name] is scheduled for chemotherapy at [facility] on Jan 3.  Additional oncologic history is of microinvasive T1 N0 (2 sentinel nodes negative) right breast cancer treated with lumpectomy with sentinel node evaluation in Nov.2000, local radiation, 5 years of tamoxifen thru Jan 2006, then AI from March 2006 thru Dec 2007. Last bilateral mammograms were at [facility] Sep 12, 2010, with breast MRI June 2011.   I have reviewed patient's previous chemotherapy information and discussed with her now. Although she had had more bothersome peripheral neuropathy in LE summer 2012,  etiology was not found and this is now only intermittently noticeable on soles of feet when she is not wearing shoes. She understands that taxol can cause peripheral neuropathy symptoms, however is in agreement with trying this at least to start. She had significant constipation with previous chemotherapy and will begin stool softner tonight then dulcolax + stool softner starting day of chemo and next several days as needed. She is aware of importance of staying well-hydrated and will check blood pressures at home before taking norvasc and bid atenolol (hypotensive with BP meds when she was also dehydrated with earlier chemo). Peripheral IV access was adequate at [facility] and she prefers this to [facility] now. We have discussed carbo skin test, as this will be 7th cycle of carboplatin upcoming. She required gCSF with taxol/carbo previously.  Patient did have flu vaccine this fall. She had colonoscopy by Dr.Ganem in Oct 2012, not remarkable.  She plans to schedule as a new patient to Dr.Valerie Leschber at Dr.Brackbill's recommendation, as he is no longer seeing noncardiology patients. She had foley catheter in for ~ 24 hrs at [facility] and did have some bladder discomfort for several days afterwards, which has at least improved. With upcoming chemotherapy, we will check to be sure no UTI.  Review of Systems otherwise: mild URI after leaving [facility], now resolved. No HA or other neuro symptoms. No SOB or cough. No pain. Bowels moving normally. No fever. No noted changes on breast self-exam. No extremity swellling. No bleeding. Less LUQ abdominal "heaviness" since she was at [facility].Remainder of 10 point ROS negative/unchanged.  Social History:  She is out on medical leave since surgery at [facility], with 90 days sick leave available then additional leave/ disability  policies. Note husband Ree Kida is retired Teacher, early years/pre.   No other changes to social history/family history/past medical history Objective:  Vital signs in last 24 hours:  BP  150/80  Pulse 67  Temp(Src) 97.7 F (36.5 C) (Oral)  Ht 4\' 11"  (1.499 m) Alert, appropriate, easily mobile, looks comfortable.   HEENT:mucous membranes moist, pharynx normal without lesions. PERRL. LymphaticsCervical, supraclavicular, and axillary nodes normal.No inguinal adenopathy Resp: clear to auscultation bilaterally and normal percussion bilaterally, Back with some scoliosis Cardio: regular rate and rhythm GI: soft, non-tender; bowel sounds normal; no masses,  no organomegaly. Laparoscopic incisions all appear healed with exception of left mid abdomen where there is slight erythema and firmness ~ 1 cm without drainage, superficial open area 3x5 mm. Extremities: no edema, cords, tenderness upper and lower extremities. Breasts: right with well-healed lumpectomy scar upper outer quadrant without dominant mass/skin changes/nipple discharge and nothing in right axilla. Left breast without masses/skin changes/nipple discharge and axilla unremarkable.    Lab Results:   [facility] 05/15/11 1129  WBC 6.7  HGB 11.9  HCT 35.5  PLT 208  ANC 3.8  BMET/CMET Entirely normal including creat 0.77. Normal electrolytes and LFTs. Urinalysis has 3-6 WBC with some clumps, culture pending. Studies/Results:  CT AP Mar 22, 2011 as above. I did not receive imaging reports from [facility].  Medications: I have reviewed the patient's current medications and allergies. We will escribe decadron and zofran; she may also want ativan 0.5 mg prn nausea, or may prefer not to fill that now.  I have discussed all of plan as above with Mrs.[name] now; she has had questions answered to her satisfaction and is in agreement. She will begin stool softner and do full premedication decadron tonight, and will be sure to be well-hydrated before IV access tomorrow.  She will have carbo skin test prior to full carboplatin tomorrow. She will have CBC and neulasta Jan 11 and I will see her back with CBC/CMET on Jan 22, with cycle 2  Jan 24 if counts are adequate.  > 50% of 60 min visit spent in discussion and coordination of care.  Assessment/Plan:  1. Papillary serous endometrial carcinoma, IB in Aug 2009, post treatment as above and now recurrent in left pelvic sidewall area, not resectable. Plan to begin chemotherapy with further taxol/carbo on Jan 3 as long as carbo skin test is negative. Expect to restage with scans after 3 cycles and consider RT. 2. Microinvasive T1 N0 right breast cancer, now out 12 years from diagnosis without evidence of recurrence.  3. Hypertension on medication 4. Possible UTI, await culture results as long as not more symptomatic. 5. Erythema at one laparoscopic incision: will wash with soap and water daily and let us know if does not improve 6. Peripheral neuropathy: extremely minimal symptoms now. Will follow closely with taxol.      Lavaris Sexson P, MD   05/15/2011, 2:31 PM

## 2011-05-16 ENCOUNTER — Ambulatory Visit (HOSPITAL_BASED_OUTPATIENT_CLINIC_OR_DEPARTMENT_OTHER): Payer: BC Managed Care – PPO

## 2011-05-16 DIAGNOSIS — Z5111 Encounter for antineoplastic chemotherapy: Secondary | ICD-10-CM

## 2011-05-16 DIAGNOSIS — C541 Malignant neoplasm of endometrium: Secondary | ICD-10-CM

## 2011-05-16 DIAGNOSIS — C549 Malignant neoplasm of corpus uteri, unspecified: Secondary | ICD-10-CM

## 2011-05-16 LAB — URINE CULTURE

## 2011-05-16 MED ORDER — SODIUM CHLORIDE 0.9 % IJ SOLN
10.0000 mL | INTRAMUSCULAR | Status: DC | PRN
  Filled 2011-05-16: qty 10

## 2011-05-16 MED ORDER — HEPARIN SOD (PORK) LOCK FLUSH 100 UNIT/ML IV SOLN
500.0000 [IU] | Freq: Once | INTRAVENOUS | Status: AC | PRN
  Filled 2011-05-16: qty 5

## 2011-05-16 MED ORDER — ONDANSETRON 16 MG/50ML IVPB (CHCC)
16.0000 mg | Freq: Once | INTRAVENOUS | Status: AC
  Administered 2011-05-16: 16 mg via INTRAVENOUS

## 2011-05-16 MED ORDER — DEXAMETHASONE SODIUM PHOSPHATE 4 MG/ML IJ SOLN
20.0000 mg | Freq: Once | INTRAMUSCULAR | Status: AC
  Administered 2011-05-16: 20 mg via INTRAVENOUS

## 2011-05-16 MED ORDER — SODIUM CHLORIDE 0.9 % IV SOLN: 16.0000 mg | Freq: Once | INTRAVENOUS | Status: DC

## 2011-05-16 MED ORDER — DIPHENHYDRAMINE HCL 50 MG/ML IJ SOLN
25.0000 mg | Freq: Once | INTRAMUSCULAR | Status: AC
  Administered 2011-05-16: 25 mg via INTRAVENOUS

## 2011-05-16 MED ORDER — FAMOTIDINE IN NACL 20-0.9 MG/50ML-% IV SOLN
20.0000 mg | Freq: Once | INTRAVENOUS | Status: AC
  Administered 2011-05-16: 20 mg via INTRAVENOUS

## 2011-05-16 MED ORDER — SODIUM CHLORIDE 0.9 % IJ SOLN
100.0000 ug | Freq: Once | INTRAMUSCULAR | Status: AC
  Administered 2011-05-16: 0.1 mg via INTRADERMAL
  Filled 2011-05-16: qty 0.01

## 2011-05-16 MED ORDER — SODIUM CHLORIDE 0.9 % IV SOLN: Freq: Once | INTRAVENOUS | Status: DC

## 2011-05-16 MED ORDER — PACLITAXEL CHEMO INJECTION 300 MG/50ML
175.0000 mg/m2 | Freq: Once | INTRAVENOUS | Status: AC
  Administered 2011-05-16: 252 mg via INTRAVENOUS
  Filled 2011-05-16: qty 42

## 2011-05-16 MED ORDER — SODIUM CHLORIDE 0.9 % IV SOLN
392.0000 mg | Freq: Once | INTRAVENOUS | Status: AC
  Administered 2011-05-16: 390 mg via INTRAVENOUS
  Filled 2011-05-16: qty 39

## 2011-05-16 NOTE — Progress Notes (Signed)
VSS, no c/o's, rate increased to 107 for rest of bag.  IV site unremarkable.

## 2011-05-16 NOTE — Patient Instructions (Signed)
Pt to call with concerns 

## 2011-05-16 NOTE — Progress Notes (Signed)
Taxol started slowly at 17ml/h x 5.37ml.  Pt. Went to bathroom & kept at slow rate longer.  Tol. Well, no c/o's.  Rate increased to 72ml/h x 10.47ml

## 2011-05-16 NOTE — Progress Notes (Signed)
1st time Taxol after 2 years, started at slow rate

## 2011-05-16 NOTE — Progress Notes (Signed)
Pt. Tol. Taxol without difficulty, rate increased to 40ml/h x 15.32ml.  No c/o's.

## 2011-05-16 NOTE — Progress Notes (Signed)
Pt. tol taxol with difficulty, rate increased to 70ml/h x 21ml, vss.

## 2011-05-17 ENCOUNTER — Telehealth: Payer: Self-pay

## 2011-05-17 NOTE — Telephone Encounter (Signed)
SPOKE WITH MR. Fusco AND TOLD HIM THAT THE URINE CULTURE SENT 05-15-11 WAS NEGATIVE FOR UTI PER DR. Darrold Span. PT. DOING FINE AFTER TREATMENT YESTERDAY PER HUSBAND.

## 2011-05-24 ENCOUNTER — Telehealth: Payer: Self-pay

## 2011-05-24 ENCOUNTER — Other Ambulatory Visit: Payer: Self-pay | Admitting: Oncology

## 2011-05-24 ENCOUNTER — Other Ambulatory Visit (HOSPITAL_BASED_OUTPATIENT_CLINIC_OR_DEPARTMENT_OTHER): Payer: BC Managed Care – PPO | Admitting: Lab

## 2011-05-24 ENCOUNTER — Ambulatory Visit (HOSPITAL_BASED_OUTPATIENT_CLINIC_OR_DEPARTMENT_OTHER): Payer: BC Managed Care – PPO

## 2011-05-24 VITALS — BP 144/71 | HR 68 | Temp 99.2°F

## 2011-05-24 DIAGNOSIS — C541 Malignant neoplasm of endometrium: Secondary | ICD-10-CM

## 2011-05-24 DIAGNOSIS — C549 Malignant neoplasm of corpus uteri, unspecified: Secondary | ICD-10-CM

## 2011-05-24 DIAGNOSIS — Z5189 Encounter for other specified aftercare: Secondary | ICD-10-CM

## 2011-05-24 LAB — CBC WITH DIFFERENTIAL/PLATELET
BASO%: 1 % (ref 0.0–2.0)
Basophils Absolute: 0 10*3/uL (ref 0.0–0.1)
EOS%: 6.5 % (ref 0.0–7.0)
Eosinophils Absolute: 0.2 10*3/uL (ref 0.0–0.5)
HCT: 32.6 % — ABNORMAL LOW (ref 34.8–46.6)
HGB: 11.1 g/dL — ABNORMAL LOW (ref 11.6–15.9)
LYMPH%: 45.8 % (ref 14.0–49.7)
MCH: 32.6 pg (ref 25.1–34.0)
MCHC: 34 g/dL (ref 31.5–36.0)
MCV: 95.8 fL (ref 79.5–101.0)
MONO#: 0.1 10*3/uL (ref 0.1–0.9)
MONO%: 4.2 % (ref 0.0–14.0)
NEUT#: 1.1 10*3/uL — ABNORMAL LOW (ref 1.5–6.5)
NEUT%: 42.5 % (ref 38.4–76.8)
Platelets: 158 10*3/uL (ref 145–400)
RBC: 3.4 10*6/uL — ABNORMAL LOW (ref 3.70–5.45)
RDW: 13.3 % (ref 11.2–14.5)
WBC: 2.7 10*3/uL — ABNORMAL LOW (ref 3.9–10.3)
lymph#: 1.2 10*3/uL (ref 0.9–3.3)

## 2011-05-24 MED ORDER — PEGFILGRASTIM INJECTION 6 MG/0.6ML
6.0000 mg | Freq: Once | SUBCUTANEOUS | Status: AC
  Administered 2011-05-24: 6 mg via SUBCUTANEOUS
  Filled 2011-05-24: qty 0.6

## 2011-05-24 NOTE — Telephone Encounter (Signed)
SPOKE WITH MR. Rhine AND TOLD HIM THAT DR. Darrold Span SAID THAT MS. UPTONS ANC IS NOT QUITE NEUTROPENIC AT 1.1.  IT IS GOOD THAT SHE GOT THE NEULASTA TODAY. MS. Debruyn HAS NOT EXPERIENCED ANY TAXOL ACHES.  AWARE OF ACHES FROM NEULASTA.   PT. TO CALL PRIOR TO 06-04-11 APPT. IF ANY PROBLEMS.   SHE IS TO CALL IF SHE HAS A TEMP. OF 100.5 OR HIGHER. HUSBAND VERBALIZED UNDERSTANDING.

## 2011-06-02 ENCOUNTER — Other Ambulatory Visit: Payer: Self-pay | Admitting: Oncology

## 2011-06-04 ENCOUNTER — Other Ambulatory Visit: Payer: BC Managed Care – PPO | Admitting: Lab

## 2011-06-04 ENCOUNTER — Ambulatory Visit (HOSPITAL_BASED_OUTPATIENT_CLINIC_OR_DEPARTMENT_OTHER): Payer: BC Managed Care – PPO | Admitting: Oncology

## 2011-06-04 ENCOUNTER — Telehealth: Payer: Self-pay | Admitting: Oncology

## 2011-06-04 VITALS — BP 149/82 | HR 67 | Temp 98.5°F | Ht 59.0 in | Wt 111.6 lb

## 2011-06-04 DIAGNOSIS — C549 Malignant neoplasm of corpus uteri, unspecified: Secondary | ICD-10-CM

## 2011-06-04 DIAGNOSIS — I10 Essential (primary) hypertension: Secondary | ICD-10-CM

## 2011-06-04 DIAGNOSIS — Z853 Personal history of malignant neoplasm of breast: Secondary | ICD-10-CM

## 2011-06-04 DIAGNOSIS — C541 Malignant neoplasm of endometrium: Secondary | ICD-10-CM

## 2011-06-04 DIAGNOSIS — G609 Hereditary and idiopathic neuropathy, unspecified: Secondary | ICD-10-CM

## 2011-06-04 LAB — COMPREHENSIVE METABOLIC PANEL
ALT: 27 U/L (ref 0–35)
AST: 42 U/L — ABNORMAL HIGH (ref 0–37)
Albumin: 4.2 g/dL (ref 3.5–5.2)
Alkaline Phosphatase: 93 U/L (ref 39–117)
BUN: 15 mg/dL (ref 6–23)
CO2: 25 mEq/L (ref 19–32)
Calcium: 8.8 mg/dL (ref 8.4–10.5)
Chloride: 105 mEq/L (ref 96–112)
Creatinine, Ser: 0.64 mg/dL (ref 0.50–1.10)
Glucose, Bld: 114 mg/dL — ABNORMAL HIGH (ref 70–99)
Potassium: 4.1 mEq/L (ref 3.5–5.3)
Sodium: 140 mEq/L (ref 135–145)
Total Bilirubin: 0.6 mg/dL (ref 0.3–1.2)
Total Protein: 6.4 g/dL (ref 6.0–8.3)

## 2011-06-04 LAB — CBC WITH DIFFERENTIAL/PLATELET
BASO%: 0 % (ref 0.0–2.0)
Basophils Absolute: 0 10*3/uL (ref 0.0–0.1)
EOS%: 0.4 % (ref 0.0–7.0)
Eosinophils Absolute: 0 10*3/uL (ref 0.0–0.5)
HCT: 33.6 % — ABNORMAL LOW (ref 34.8–46.6)
HGB: 11.3 g/dL — ABNORMAL LOW (ref 11.6–15.9)
LYMPH%: 10.8 % — ABNORMAL LOW (ref 14.0–49.7)
MCH: 32.4 pg (ref 25.1–34.0)
MCHC: 33.7 g/dL (ref 31.5–36.0)
MCV: 96 fL (ref 79.5–101.0)
MONO#: 0.6 10*3/uL (ref 0.1–0.9)
MONO%: 5 % (ref 0.0–14.0)
NEUT#: 9.9 10*3/uL — ABNORMAL HIGH (ref 1.5–6.5)
NEUT%: 83.8 % — ABNORMAL HIGH (ref 38.4–76.8)
Platelets: 108 10*3/uL — ABNORMAL LOW (ref 145–400)
RBC: 3.5 10*6/uL — ABNORMAL LOW (ref 3.70–5.45)
RDW: 14.3 % (ref 11.2–14.5)
WBC: 11.8 10*3/uL — ABNORMAL HIGH (ref 3.9–10.3)
lymph#: 1.3 10*3/uL (ref 0.9–3.3)

## 2011-06-04 NOTE — Patient Instructions (Signed)
We will recheck CBC day of chemo 06-06-11 to be sure platelets are at least 100k  We will coordinate neulasta injection with Dr.Gehrig's appt 1-31

## 2011-06-04 NOTE — Progress Notes (Signed)
OFFICE PROGRESS NOTE Date of Visit  06-04-11 Physicians:  P.Gehrig, J.Kinard, (T.Brackbill), S.Ganem,  New patient visit Dr.V.Leschber pending.  INTERVAL HISTORY:   Patient is seen, alone for visit today, now back on chemotherapy for recurrent endometrial carcinoma. She had first treatment of taxol/carboplatin plus carbo skin test on May 16, 2011 and [place] on Jan11. She has done well overall with this most recent chemotherapy, with less fatigue than with initial chemotherapy in 2009. She used zofran for ~ 2 days after treatment and prn since then, did not use any ativan. She has been staying well hydrated and has been eating. She had some constipation which improved with increase in stool softner and laxatives. She has held norvasc for BP initially <110 systolic, has been checking blood pressure several times daily at home, took just atenolol today. She thinks she has felt more energetic off the norvasc. She has no dysuria now, urine culture from Jan 2 no growth. She had some minimal aching after neulasta. She has noticed no peripheral neuropathy in hands and no worsening of slight chronic peripheral neuropathy in feet. Peripheral IV access was adequate. She has same minimal heavy sensation in LUQ occasionally, this the complaint that eventually led to finding of recurrent endometrial ca on CT. The single slightly irritated incision from laparoscopic procedure is improved, no symptoms there.  History is of microinvasive T1N0 right breast cancer, diagnosed in 03/1999 and treated with lumpectomy with 2 sentinel node evaluation, local radiation and 5 years of  Tamoxifen thru 05/2004, then aromatase inhibitor 07/2004 thru 04/2006.  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 She had 6 cycles of taxol/carboplatin through Jan 2010, and vaginal cuff brachytherapy. She did  well until some vague LUQ symptoms in Nov. 2012, with CT AP in [facility] system Mar 22, 2011 showing left pelvic sidewall involvement. She was taken to laparoscopic evaluation at [facility] by Dr.Gehrig 04-25-2011, with finding of dense fibrosis in the area as well as mass encasing obturator nerve and vein as well as internal iliac vasculature; left ureterolysis was performed. No pathology was submitted from that procedure. Plan at present is 3 cycles of chemotherapy, then if good response might give RT prior to additional chemo, vs more than 3 cycles prior to RT. She is to see Dr. Duard Brady at [facility] on 06-13-11.  Review of Systems otherwise: no fever or symptoms of infection. Stamina not as good for daily walking. No specific shortness of breath. No bleeding. Remainder of 10 point Review of Systems negative.  Objective:  Vital signs in last 24 hours:  BP 149/82  Pulse 67  Temp(Src) 98.5 F (36.9 C) (Oral)  Ht 4\' 11"  (1.499 m)  Wt 111 lb 9.6 oz (50.621 kg)  BMI 22.54 kg/m2 This weight is stable from Aug 2012. Alert, easily mobile, appears comfortable. No alopecia yet.   HEENT:mucous membranes moist, pharynx normal without lesions LymphaticsCervical, supraclavicular, and axillary nodes normal.No inguinal adenopathy Resp: clear to auscultation bilaterally and normal percussion bilaterally Cardio: regular rate and rhythm GI: soft, non-tender; bowel sounds normal; no masses,  no organomegaly. LUQ surgical incision closed, minimal pinkness, no drainage or tenderness, ? If suture is under skin. Other incisions healed, nontender Extremities: extremities normal, atraumatic, no cyanosis or edema Neuro:no focal deficits Breasts: right with well healed lumpectomy scar laterally, no dominant masses, no skin changes of concern, no nipple discharge. Left Breast unremarkable.  Nothing in either axilla, no swelling either upper extremity.    Lab Results:   [facility] 06/04/11 1258  WBC 11.8*  HGB 11.3*  HCT 33.6*    PLT 108*  ANC 9.9 post neulasta.  BMET CMET available after the visit: glucose 114, SGOT 42, otherwise full CMET normal including BUN 15 and creat 0.6 Studies/Results:  No results found.  Medications: I have reviewed the patient's current medications.  Assessment/Plan:  1. Recurrent endometrial carcinoma: densely fibrotic in left pelvic area such that surgical resection not possible. Counts are just adequate for cycle 2 taxol/carboplatin on 1-24, tho we will repeat CBC day of Rx to be sure ANC >=1.5 and plt >=100k. She will have neulasta 1-31 (when she is at [facility] for gyn onc visit) and I will see her at least Feb 8 with cbc/cmet. 2. HTN: BP higher today off norvasc, however she has in past been symptomatic with hypotension after chemo. She will continue to monitor closely at home and will discuss with Dr.Leschber at new patient visit there (?March or April?). 3.minimal peripheral neuropathy symptoms in feet baseline 4. Stage 1 right breast cancer now out over 12 years from diagnosis and not known recurrent. Note tamoxifen was used in that adjuvant therapy        [name] P, MD   06/04/2011, 1:51 PM

## 2011-06-04 NOTE — Telephone Encounter (Signed)
Gv pt appt for [id number] °

## 2011-06-05 ENCOUNTER — Ambulatory Visit: Payer: BC Managed Care – PPO | Admitting: Oncology

## 2011-06-06 ENCOUNTER — Other Ambulatory Visit: Payer: BC Managed Care – PPO | Admitting: Lab

## 2011-06-06 ENCOUNTER — Ambulatory Visit (HOSPITAL_BASED_OUTPATIENT_CLINIC_OR_DEPARTMENT_OTHER): Payer: BC Managed Care – PPO

## 2011-06-06 ENCOUNTER — Other Ambulatory Visit: Payer: Self-pay | Admitting: Oncology

## 2011-06-06 DIAGNOSIS — C541 Malignant neoplasm of endometrium: Secondary | ICD-10-CM

## 2011-06-06 DIAGNOSIS — C549 Malignant neoplasm of corpus uteri, unspecified: Secondary | ICD-10-CM

## 2011-06-06 DIAGNOSIS — Z5111 Encounter for antineoplastic chemotherapy: Secondary | ICD-10-CM

## 2011-06-06 LAB — CBC WITH DIFFERENTIAL/PLATELET
BASO%: 0.4 % (ref 0.0–2.0)
Basophils Absolute: 0 10*3/uL (ref 0.0–0.1)
EOS%: 0 % (ref 0.0–7.0)
Eosinophils Absolute: 0 10*3/uL (ref 0.0–0.5)
HCT: 34.6 % — ABNORMAL LOW (ref 34.8–46.6)
HGB: 11.4 g/dL — ABNORMAL LOW (ref 11.6–15.9)
LYMPH%: 8.6 % — ABNORMAL LOW (ref 14.0–49.7)
MCH: 31.1 pg (ref 25.1–34.0)
MCHC: 32.9 g/dL (ref 31.5–36.0)
MCV: 94.3 fL (ref 79.5–101.0)
MONO#: 0 10*3/uL — ABNORMAL LOW (ref 0.1–0.9)
MONO%: 0.4 % (ref 0.0–14.0)
NEUT#: 5.1 10*3/uL (ref 1.5–6.5)
NEUT%: 90.6 % — ABNORMAL HIGH (ref 38.4–76.8)
Platelets: 139 10*3/uL — ABNORMAL LOW (ref 145–400)
RBC: 3.67 10*6/uL — ABNORMAL LOW (ref 3.70–5.45)
RDW: 14.5 % (ref 11.2–14.5)
WBC: 5.6 10*3/uL (ref 3.9–10.3)
lymph#: 0.5 10*3/uL — ABNORMAL LOW (ref 0.9–3.3)
nRBC: 0 % (ref 0–0)

## 2011-06-06 MED ORDER — SODIUM CHLORIDE 0.9 % IV SOLN
Freq: Once | INTRAVENOUS | Status: AC
Start: 2011-06-06 — End: 2011-06-06
  Administered 2011-06-06: 10:00:00 via INTRAVENOUS

## 2011-06-06 MED ORDER — DIPHENHYDRAMINE HCL 25 MG PO CAPS
25.0000 mg | ORAL_CAPSULE | Freq: Once | ORAL | Status: AC
  Administered 2011-06-06: 25 mg via ORAL

## 2011-06-06 MED ORDER — DIPHENHYDRAMINE HCL 50 MG/ML IJ SOLN: 25.0000 mg | Freq: Once | INTRAMUSCULAR | Status: DC

## 2011-06-06 MED ORDER — ONDANSETRON 16 MG/50ML IVPB (CHCC)
16.0000 mg | Freq: Once | INTRAVENOUS | Status: AC
  Administered 2011-06-06: 16 mg via INTRAVENOUS

## 2011-06-06 MED ORDER — DEXAMETHASONE SODIUM PHOSPHATE 4 MG/ML IJ SOLN
20.0000 mg | Freq: Once | INTRAMUSCULAR | Status: AC
  Administered 2011-06-06: 20 mg via INTRAVENOUS

## 2011-06-06 MED ORDER — PACLITAXEL CHEMO INJECTION 300 MG/50ML
175.0000 mg/m2 | Freq: Once | INTRAVENOUS | Status: AC
  Administered 2011-06-06: 252 mg via INTRAVENOUS
  Filled 2011-06-06: qty 42

## 2011-06-06 MED ORDER — FAMOTIDINE IN NACL 20-0.9 MG/50ML-% IV SOLN
20.0000 mg | Freq: Once | INTRAVENOUS | Status: AC
Start: 2011-06-06 — End: 2011-06-06
  Administered 2011-06-06: 20 mg via INTRAVENOUS

## 2011-06-06 MED ORDER — SODIUM CHLORIDE 0.9 % IV SOLN
390.0000 mg | Freq: Once | INTRAVENOUS | Status: AC
  Administered 2011-06-06: 390 mg via INTRAVENOUS
  Filled 2011-06-06: qty 39

## 2011-06-06 MED ORDER — SODIUM CHLORIDE 0.9 % IJ SOLN
100.0000 ug | Freq: Once | INTRAVENOUS | Status: AC
  Administered 2011-06-06: 0.1 mg via INTRADERMAL
  Filled 2011-06-06: qty 0.01

## 2011-06-06 NOTE — Patient Instructions (Signed)
[facility] Discharge Instructions for Patients Receiving Chemotherapy  Today you received the following chemotherapy agents :Taxol & Carboplatin  To help prevent nausea and vomiting after your treatment, we encourage you to take your nausea medications : Zofran 8 mg and /or Ativan 0.5 mg Zofran 8 mg--take one every 12 hours as needed for nausea Ativan 0.5mg --take one every 4 hours as needed for nausea (will cause drowsiness)   If you develop nausea and vomiting that is not controlled by your nausea medication, call the clinic. If it is after clinic hours your family physician or the after hours number for the clinic or go to the Emergency Department.   BELOW ARE SYMPTOMS THAT SHOULD BE REPORTED IMMEDIATELY:  *FEVER GREATER THAN 100.5 F  *CHILLS WITH OR WITHOUT FEVER  NAUSEA AND VOMITING THAT IS NOT CONTROLLED WITH YOUR NAUSEA MEDICATION  *UNUSUAL SHORTNESS OF BREATH  *UNUSUAL BRUISING OR BLEEDING  TENDERNESS IN MOUTH AND THROAT WITH OR WITHOUT PRESENCE OF ULCERS  *URINARY PROBLEMS  *BOWEL PROBLEMS  UNUSUAL RASH Items with * indicate a potential emergency and should be followed up as soon as possible.  . Feel free to call the clinic you have any questions or concerns. The clinic phone number is [phone].   I have been informed and understand all the instructions given to me. I know to contact the clinic, my physician, or go to the Emergency Department if any problems should occur. I do not have any questions at this time, but understand that I may call the clinic during office hours   should I have any questions or need assistance in obtaining follow up care.    __________________________________________  _____________  __________ Signature of Patient or Authorized Representative            Date                   Time    __________________________________________ Nurse's Signature

## 2011-06-06 NOTE — Progress Notes (Signed)
1053--carbo test dose site unremarkable 1103--carbo test dose site unremarkable 1118--carbo test dose site unremarkable  Gave patient po Benadryl at her request--does not want the IV Benadryl

## 2011-06-11 ENCOUNTER — Telehealth: Payer: Self-pay

## 2011-06-11 ENCOUNTER — Other Ambulatory Visit: Payer: Self-pay | Admitting: Oncology

## 2011-06-11 DIAGNOSIS — C541 Malignant neoplasm of endometrium: Secondary | ICD-10-CM

## 2011-06-11 NOTE — Telephone Encounter (Signed)
[name] HAD HER SECOND CYCLE OF TAXOL CARBO ON 06-06-11.  SHE HAS BEEN FEELING DIZZY AND LIGHT HEADED  IN AM.  HER LEGS FEEL RUBBERY. SHE HAS BEEN HOLDING BOTH NORVASC AND TENORMIN SINCE Sunday 06-09-11. AFEBRILE.  DRINKING > 64 OZ OF FLUIDS A DAY.  EATING PRETTY WELL.   BP TODAY 126/74 -107/61, HR 89-93. PT. OUT ON THE DECK IN THE SUN READING THIS AFTERNOON. ENCOURAGED HER TO TAKE A WALK.  THIS CAN HELP WITH FATIGUE FROM CHEMOTHERAPY.  SHE THOUGHT SHE WOULD FEEL BETTER BY NOW.  TOLD HER THAT SHE MAY FEEL MORE FATIGUED  WITH EACH TREATMENT AND IT MAY LAST LONGER.  SHE SAID HER HUSBAND REMINDED HER OF THIS TODAY FROM PREVIOUS TREATMENT. SHE RECEIVES NEULASTA 06-13-11 AND SEES DR. Duard Brady THAT DAY ALSO. TOLD HER THAT THIS INFORMATION WILL BE REVIEWED WITH DR. Darrold Span.

## 2011-06-11 NOTE — Telephone Encounter (Signed)
SPOKE WITH MS. Whitebread AND TOLD HER THAT DR. Darrold Span WANTS TO GET A CBC  C-MET TODAY OR TOMORROW. MS. Crompton SCHEDULED FOR 1015 ON WED. 06-12-11.  SHE WILL WAIT FOR CBC RESULTS. PT. DUE FOR NEULASTA ON 06-13-11.

## 2011-06-12 ENCOUNTER — Telehealth: Payer: Self-pay

## 2011-06-12 ENCOUNTER — Other Ambulatory Visit (HOSPITAL_BASED_OUTPATIENT_CLINIC_OR_DEPARTMENT_OTHER): Payer: BC Managed Care – PPO | Admitting: Lab

## 2011-06-12 DIAGNOSIS — C549 Malignant neoplasm of corpus uteri, unspecified: Secondary | ICD-10-CM

## 2011-06-12 DIAGNOSIS — C541 Malignant neoplasm of endometrium: Secondary | ICD-10-CM

## 2011-06-12 LAB — CBC WITH DIFFERENTIAL/PLATELET
BASO%: 0.4 % (ref 0.0–2.0)
Basophils Absolute: 0 10*3/uL (ref 0.0–0.1)
EOS%: 0.8 % (ref 0.0–7.0)
Eosinophils Absolute: 0 10*3/uL (ref 0.0–0.5)
HCT: 31.3 % — ABNORMAL LOW (ref 34.8–46.6)
HGB: 10.8 g/dL — ABNORMAL LOW (ref 11.6–15.9)
LYMPH%: 35.8 % (ref 14.0–49.7)
MCH: 33.1 pg (ref 25.1–34.0)
MCHC: 34.3 g/dL (ref 31.5–36.0)
MCV: 96.3 fL (ref 79.5–101.0)
MONO#: 0.1 10*3/uL (ref 0.1–0.9)
MONO%: 2 % (ref 0.0–14.0)
NEUT#: 2.2 10*3/uL (ref 1.5–6.5)
NEUT%: 61 % (ref 38.4–76.8)
Platelets: 152 10*3/uL (ref 145–400)
RBC: 3.25 10*6/uL — ABNORMAL LOW (ref 3.70–5.45)
RDW: 14.7 % — ABNORMAL HIGH (ref 11.2–14.5)
WBC: 3.7 10*3/uL — ABNORMAL LOW (ref 3.9–10.3)
lymph#: 1.3 10*3/uL (ref 0.9–3.3)

## 2011-06-12 LAB — BASIC METABOLIC PANEL
BUN: 18 mg/dL (ref 6–23)
CO2: 28 mEq/L (ref 19–32)
Calcium: 9.2 mg/dL (ref 8.4–10.5)
Chloride: 102 mEq/L (ref 96–112)
Creatinine, Ser: 0.58 mg/dL (ref 0.50–1.10)
Glucose, Bld: 80 mg/dL (ref 70–99)
Potassium: 4.8 mEq/L (ref 3.5–5.3)
Sodium: 137 mEq/L (ref 135–145)

## 2011-06-12 NOTE — Progress Notes (Signed)
PT. CAME FOR LABS TODAY. CBC RESULTS REVIEWED WITH PT. AND DR. Darrold Span.  COUNTS GOOD. PT. FEELING BETTER THIS AM.  WILL GET NEULASTA INJ TOMORROW AS SCHEDULED AND SEE DR. Duard Brady AS WELL.

## 2011-06-12 NOTE — Telephone Encounter (Signed)
SPOKE WITH [name] AND TOLD HER THAT HER CHEMISTRIES FROM TODAY WERE ALL GOOD PER DR. Darrold Span.  PT. PLEASED.

## 2011-06-13 ENCOUNTER — Encounter: Payer: Self-pay | Admitting: Gynecologic Oncology

## 2011-06-13 ENCOUNTER — Ambulatory Visit: Payer: BC Managed Care – PPO | Attending: Gynecologic Oncology | Admitting: Gynecologic Oncology

## 2011-06-13 ENCOUNTER — Ambulatory Visit (HOSPITAL_BASED_OUTPATIENT_CLINIC_OR_DEPARTMENT_OTHER): Payer: BC Managed Care – PPO

## 2011-06-13 VITALS — BP 129/75 | HR 96 | Temp 99.1°F

## 2011-06-13 VITALS — BP 116/70 | HR 100 | Temp 97.5°F | Resp 16 | Ht 59.49 in | Wt 110.6 lb

## 2011-06-13 DIAGNOSIS — E785 Hyperlipidemia, unspecified: Secondary | ICD-10-CM | POA: Insufficient documentation

## 2011-06-13 DIAGNOSIS — I10 Essential (primary) hypertension: Secondary | ICD-10-CM | POA: Insufficient documentation

## 2011-06-13 DIAGNOSIS — C549 Malignant neoplasm of corpus uteri, unspecified: Secondary | ICD-10-CM

## 2011-06-13 DIAGNOSIS — Z5189 Encounter for other specified aftercare: Secondary | ICD-10-CM

## 2011-06-13 DIAGNOSIS — E7800 Pure hypercholesterolemia, unspecified: Secondary | ICD-10-CM | POA: Insufficient documentation

## 2011-06-13 DIAGNOSIS — Z90710 Acquired absence of both cervix and uterus: Secondary | ICD-10-CM | POA: Insufficient documentation

## 2011-06-13 DIAGNOSIS — C541 Malignant neoplasm of endometrium: Secondary | ICD-10-CM

## 2011-06-13 DIAGNOSIS — Z853 Personal history of malignant neoplasm of breast: Secondary | ICD-10-CM | POA: Insufficient documentation

## 2011-06-13 DIAGNOSIS — C772 Secondary and unspecified malignant neoplasm of intra-abdominal lymph nodes: Secondary | ICD-10-CM | POA: Insufficient documentation

## 2011-06-13 DIAGNOSIS — Z9079 Acquired absence of other genital organ(s): Secondary | ICD-10-CM | POA: Insufficient documentation

## 2011-06-13 DIAGNOSIS — Z87891 Personal history of nicotine dependence: Secondary | ICD-10-CM | POA: Insufficient documentation

## 2011-06-13 DIAGNOSIS — Z79899 Other long term (current) drug therapy: Secondary | ICD-10-CM | POA: Insufficient documentation

## 2011-06-13 MED ORDER — PEGFILGRASTIM INJECTION 6 MG/0.6ML
6.0000 mg | Freq: Once | SUBCUTANEOUS | Status: AC
  Administered 2011-06-13: 6 mg via SUBCUTANEOUS
  Filled 2011-06-13: qty 0.6

## 2011-06-13 NOTE — Patient Instructions (Signed)
Follow up with Dr. Livesay as scheduled. 

## 2011-06-13 NOTE — Progress Notes (Signed)
Consult Note: Gyn-Onc  [name] 71 y.o. female  CC:  Chief Complaint  Patient presents with  . Endometrial cancer    follow up    HPI:  This is a very pleasant 71 year old with history of stage IB uterine papillary serous carcinoma. She underwent a laparoscopic hysterectomy and appropriate staging 2009. Pathology revealed a papillary serous carcinoma with 36% myometrial invasion, 15 negative lymph nodes. She did have lymphovascular space involvement. Preoperative CT scan was negative and her CA 125 at the time of diagnosis was normal at 7.1. Postop really should receive 6 cycles of paclitaxel and carboplatin-based chemotherapy with vaginal cuff brachytherapy. Her last cycle of chemotherapy was in January of 2010 inches a negative posttreatment CT scan. I last saw her in Nov. or 2012 at which time her exam and Pap smear were unremarkable.  At that time, she was complaining of some early satiety and abdominal discomfort since the summer appear. She has a CT scan done that revealed recurrent disease along the left obturator node chain. One set of lymph nodes measured 3 x 2 cm and the other area measured 2 x 2 centimeters. There is no peritoneal disease, there is no evidence of any other abdominal disease.   Interval History:  After discussion she opted for attempt at surgical debulking at [facility].  On December 13 she underwent robotic evaluation. At that time she does have in the retroperitoneum on the left side. There is a large nodal mass encasing the obturator nerve obturator vein internal iliac vasculature and left ureter. We performed a left ureteral lysis. After the identified structures it was clear that this was not Goble. The procedure at that time was aborted. There is no pathology submitted at that time. It was recommended that she undergo retreatment with paclitaxel and carboplatin she is undergone 2 cycles of paclitaxel and carboplatin. She states that her cycles quite easy. The most  recent cycle which was given on January 24 was a little bit harder in terms of fatigue. She feels that the fatigue really took about 5 days to go away. She feels very well today. She has some concerns about her blood pressure and it seems to go low to the 100s-90s over 60s to 70s. She stops her atenolol when that happens and then notices her heart racing. We discussed that today the fullness and discomfort that she was having a prompted the initial CT scan she is no longer really feeling much more intermittent which she takes the good side her appetite is quite good. Review of Systems: She comes in today for her postoperative check.  Current Meds:  Outpatient Encounter Prescriptions as of 06/13/2011  Medication Sig Dispense Refill  . Acetylcarnitine HCl (ACETYL-L-CARNITINE HCL) POWD daily.        . ALPHA-LIPOIC ACID 100 MG TABS Take 100 mg by mouth daily.        Marland Kitchen ALPRAZolam (XANAX) 0.25 MG tablet Take 0.25 mg by mouth as needed.       Marland Kitchen aspirin 81 MG tablet Take 81 mg by mouth daily.        . Calcium Carbonate-Vitamin D (CALCIUM 600+D PO) Take 1 tablet by mouth 3 (three) times a week.        . cholecalciferol (VITAMIN D) 1000 UNITS tablet Take 1,000 Units by mouth daily.        . Coenzyme Q10 (CO Q10) 100 MG TABS Take 100 mg elemental calcium/kg/hr by mouth daily.        Marland Kitchen  dexamethasone (DECADRON) 4 MG tablet TAKE 5 TABS = 20 MG 12 HRS. AND 6 HRS WITH FOOD PRIOR TO TAXOL CHEMOTHERAPY.  20 tablet  0  . fish oil-omega-3 fatty acids 1000 MG capsule Take 1 g by mouth daily.        . Magnesium 250 MG TABS Take 250 mg by mouth 3 (three) times a week.        . ondansetron (ZOFRAN) 8 MG tablet TAKE 1-2 TABS EVERY 12 HRS AS NEEDED FOR NAUSEA.  30 tablet  2  . simvastatin (ZOCOR) 20 MG tablet Take 1 tablet (20 mg total) by mouth at bedtime.  90 tablet  3  . amLODipine (NORVASC) 5 MG tablet Take 1 tablet (5 mg total) by mouth daily.  90 tablet  3  . atenolol (TENORMIN) 25 MG tablet Take 1 tablet (25 mg  total) by mouth 2 (two) times daily.  180 tablet  3  . LORazepam (ATIVAN) 0.5 MG tablet TAKE 1 TABLET ORALLY OR UNDER THE TONGUE EVERY 4 HRS. AS NEEDED FOR NAUSEA  20 tablet  0   Facility-Administered Encounter Medications as of 06/13/2011  Medication Dose Route Frequency Provider Last Rate Last Dose  . 0.9 %  sodium chloride infusion   Intravenous Once [name], MD      . pegfilgrastim (NEULASTA) injection 6 mg  6 mg Subcutaneous Once Lennis Buzzy Han, MD   6 mg at 06/13/11 1328  . sodium chloride 0.9 % injection 10 mL  10 mL Intracatheter PRN Lennis Buzzy Han, MD        Allergy:  Allergies  Allergen Reactions  . Ace Inhibitors Anaphylaxis  . Codeine   . Demerol     Social Hx:   History   Social History  . Marital Status: Married    Spouse Name: N/A    Number of Children: N/A  . Years of Education: N/A   Occupational History  . Not on file.   Social History Main Topics  . Smoking status: Former Smoker    Types: Cigarettes    Quit date: 05/13/1962  . Smokeless tobacco: Never Used  . Alcohol Use: 3.0 oz/week    5 Glasses of wine per week  . Drug Use: No  . Sexually Active: No   Other Topics Concern  . Not on file   Social History Narrative  . No narrative on file    Past Surgical Hx:  Past Surgical History  Procedure Date  . Total abdominal hysterectomy w/ bilateral salpingoophorectomy   . Breast lumpectomy   . Appendectomy   . Tonsillectomy   . Foot surgery     RIGHT    Past Medical Hx:  Past Medical History  Diagnosis Date  . Hypertension   . Palpitations   . History of breast cancer 2000    right breast -- treated with lumpectomy and radiation therarpy, postoperatively with tamoxifen   . Endometrial cancer 12/2007    s/p total abdominal hysterectomy at [facility] - ovaries were also removed   . Hyperlipidemia   . Hypercholesterolemia   . PVC's (premature ventricular contractions)     Family Hx:  Family History  Problem Relation Age of  Onset  . Thrombosis Father     coronary thrombosis  . Hypertension Father   . Heart failure Mother   . Coronary artery disease Brother     Vitals:  Blood pressure 116/70, pulse 100, temperature 97.5 F (36.4 C), temperature source Oral, resp. rate 16,  height 4' 11.49" (1.511 m), weight 110 lb 9.6 oz (50.168 kg).  Physical Exam:  Well-nourished well-developed female who appears younger than stated age in no acute distress. Abdomen shows well-healed surgical incisions. Abdomen is soft and nontender. Assessment/Plan: 71 year old with recurrent stage IB uterine papillary serous carcinoma with unresectable nodal metastases. 1. We'll proceed with one additional cycle paclitaxel and carboplatin obtain a CT scan approximately 2-3 weeks after that cycle to determine which we should way we should go. If she has had a dramatic response to chemotherapy, we may choose to continue chemotherapy after discussion with Dr. Darrold Span or entertain the possibility of attempted debulking at that time. We'll also have to determine whether or not it is a role for some adjuvant radiation therapy. 2. I discussed rather than taking 25 mg of atenolol twice daily that she take 12.5 mg of atenolol twice daily. 3. I will follow up with her after her CT scan.  Janyra Barillas A., MD 06/13/2011, 2:53 PM

## 2011-06-21 ENCOUNTER — Telehealth: Payer: Self-pay | Admitting: Oncology

## 2011-06-21 ENCOUNTER — Ambulatory Visit (HOSPITAL_BASED_OUTPATIENT_CLINIC_OR_DEPARTMENT_OTHER): Payer: BC Managed Care – PPO | Admitting: Oncology

## 2011-06-21 ENCOUNTER — Other Ambulatory Visit: Payer: BC Managed Care – PPO | Admitting: Lab

## 2011-06-21 VITALS — BP 150/72 | HR 64 | Temp 97.3°F | Ht 59.5 in | Wt 110.1 lb

## 2011-06-21 DIAGNOSIS — C549 Malignant neoplasm of corpus uteri, unspecified: Secondary | ICD-10-CM

## 2011-06-21 DIAGNOSIS — C541 Malignant neoplasm of endometrium: Secondary | ICD-10-CM

## 2011-06-21 LAB — COMPREHENSIVE METABOLIC PANEL
ALT: 22 U/L (ref 0–35)
AST: 32 U/L (ref 0–37)
Albumin: 4 g/dL (ref 3.5–5.2)
Alkaline Phosphatase: 148 U/L — ABNORMAL HIGH (ref 39–117)
BUN: 16 mg/dL (ref 6–23)
CO2: 23 mEq/L (ref 19–32)
Calcium: 9.4 mg/dL (ref 8.4–10.5)
Chloride: 103 mEq/L (ref 96–112)
Creatinine, Ser: 0.64 mg/dL (ref 0.50–1.10)
Glucose, Bld: 90 mg/dL (ref 70–99)
Potassium: 4.1 mEq/L (ref 3.5–5.3)
Sodium: 139 mEq/L (ref 135–145)
Total Bilirubin: 0.4 mg/dL (ref 0.3–1.2)
Total Protein: 6.3 g/dL (ref 6.0–8.3)

## 2011-06-21 LAB — CBC WITH DIFFERENTIAL/PLATELET
BASO%: 0.2 % (ref 0.0–2.0)
Basophils Absolute: 0 10e3/uL (ref 0.0–0.1)
EOS%: 0.1 % (ref 0.0–7.0)
Eosinophils Absolute: 0 10e3/uL (ref 0.0–0.5)
HCT: 31.3 % — ABNORMAL LOW (ref 34.8–46.6)
HGB: 10.4 g/dL — ABNORMAL LOW (ref 11.6–15.9)
LYMPH%: 9.3 % — ABNORMAL LOW (ref 14.0–49.7)
MCH: 32.3 pg (ref 25.1–34.0)
MCHC: 33.3 g/dL (ref 31.5–36.0)
MCV: 97 fL (ref 79.5–101.0)
MONO#: 0.6 10e3/uL (ref 0.1–0.9)
MONO%: 2.9 % (ref 0.0–14.0)
NEUT#: 17 10e3/uL — ABNORMAL HIGH (ref 1.5–6.5)
NEUT%: 87.5 % — ABNORMAL HIGH (ref 38.4–76.8)
Platelets: 121 10e3/uL — ABNORMAL LOW (ref 145–400)
RBC: 3.22 10e6/uL — ABNORMAL LOW (ref 3.70–5.45)
RDW: 16.5 % — ABNORMAL HIGH (ref 11.2–14.5)
WBC: 19.4 10e3/uL — ABNORMAL HIGH (ref 3.9–10.3)
lymph#: 1.8 10e3/uL (ref 0.9–3.3)

## 2011-06-21 NOTE — Telephone Encounter (Signed)
Pt aware of 2/14 labs 2/21 inj and chemo moved to 2/15,ct 3/6     Printed  aom

## 2011-06-23 ENCOUNTER — Encounter: Payer: Self-pay | Admitting: Oncology

## 2011-06-23 NOTE — Progress Notes (Signed)
OFFICE PROGRESS NOTE  Date of Visit 06-21-2011 Physicians: P.Gehrig, J.Kinard, (T.Brackbill/ upcoming new to V.Felicity Coyer), S.Ganem  INTERVAL HISTORY:   Patient is seen, alone for visit today, now on chemotherapy for recurrent endometrial carcinoma. She had cycle 2 of taxol/ carboplatin on 06-06-11, with carbo skin test and with neulasta on 06-13-11. She was extremely fatigued days 3 - 7, tho was pushing po fluids, blood sugars not low by husband's glucometer, CBC and BMET checked on day 7 ok, nausea controlled with zofran, no severe taxol aches (mild aches post neulasta). She saw Dr.Gehrig on 06-13-11, with recommendation that she have repeat CT 2-3 weeks after cycle 3, then decision as to further chemo vs attempted debulking vs RT.  History is of microinvasive T1N0 right breast cancer, diagnosed in 03/1999 and treated with lumpectomy with 2 sentinel node evaluation, local radiation and 5 years of Tamoxifen thru 05/2004, then aromatase inhibitor 07/2004 thru 04/2006. 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 She had 6 cycles of taxol/carboplatin through Jan 2010, and vaginal cuff brachytherapy. She did well until some vague LUQ symptoms in Nov. 2012, with CT AP in [facility] Mar 22, 2011 showing left pelvic sidewall involvement. She was taken to laparoscopic evaluation at [facility] by Dr.Gehrig 04-25-2011, with finding of dense fibrosis in the area as well as mass encasing obturator nerve and vein as well as internal iliac vasculature; left ureterolysis was performed. No pathology was submitted from that procedure. She has now had 2 additional cycles of taxol/carboplatin as above.  Review of Systems otherwise: No significant neuropathy in hands; some stable neuropathy in distal feet especially noticeable iin bed, better with shoes on.No fever or symptoms of infection. Appetite  fairly good now. Bowels ok, bladder ok. No bleeding. No new or different pain. No discomfort now LLQ. Has tried to walk more in past week. Remainder of 10 point Review of Systems negative/ unchanged.  Objective:  Vital signs in last 24 hours:  BP 150/72  Pulse 64  Temp(Src) 97.3 F (36.3 C) (Oral)  Ht 4' 11.5" (1.511 m)  Wt 110 lb 1.6 oz (49.941 kg)  BMI 21.87 kg/m2 Weight is down 1.5 lbs from last visit. Alert, easily mobile, no obvious discomfort.    HEENT:mucous membranes moist, pharynx normal without lesions Alopecia. PERRL LymphaticsCervical, supraclavicular, and axillary nodes normal.No inguinal adenopathy Resp: clear to auscultation bilaterally and normal percussion bilaterally Cardio: regular rate and rhythm, no gallop, clear heart sounds GI: soft, non-tender; bowel sounds normal; no masses,  no organomegaly Extremities: extremities normal, atraumatic, no cyanosis or edema Neuro:no sensory deficits noted Breasts bilaterally without dominant masses, lumpectomy scar on right unchanged No Portacath/PICC  Lab Results:   Basename 06/21/11 1307  WBC 19.4*  HGB 10.4*  HCT 31.3*  PLT 121*  ANC 17 post neulasta  BMET  Basename 06/21/11 1307  NA 139  K 4.1  CL 103  CO2 23  GLUCOSE 90  BUN 16  CREATININE 0.64  CALCIUM 9.4  full CMET available after visit with new elevation in alk phos to 148 which we will follow.  Studies/Results:  No results found.  Medications: I have reviewed the patient's current medications.Prescription given for decadron for next cycle only.  Patient requests moving date of next treatment to Feb 15 due to a symphony performance Feb 14.  Assessment/Plan:  1. Recurrent endometrial carcinoma: will check cbc 2-14 and treat  with cycle 3 taxol/carbo on 2-15 as long as ANC >=1.5 and plt >=100k and if skin test is negative. She plans to slow activities significantly for several days after Rx.She will have neulasta 2-12, then repeat CT ~ 3-6.  Dr.Gehrig will need to review CT before further regular scheduling, including my next visit. 2.Stage 1 right breast cancer , not known recurrent 3.hx HTN, also tachycardia when holds B-blocker and some low BPs after chemo: Dr.Brackbill no longer primary, but may be helpful to see him in this regard and we will be in touch with patient about this.   Patient was in agreement with plan as above (did not discuss evaluation by Dr.Brackbill during visit).     Jeda Pardue P, MD   06/23/2011, 10:08 AM

## 2011-06-24 ENCOUNTER — Other Ambulatory Visit: Payer: Self-pay

## 2011-06-24 ENCOUNTER — Telehealth: Payer: Self-pay

## 2011-06-24 ENCOUNTER — Telehealth: Payer: Self-pay | Admitting: Oncology

## 2011-06-24 DIAGNOSIS — C549 Malignant neoplasm of corpus uteri, unspecified: Secondary | ICD-10-CM

## 2011-06-24 MED ORDER — DEXAMETHASONE 4 MG PO TABS: ORAL_TABLET | ORAL | Status: DC

## 2011-06-24 NOTE — Telephone Encounter (Signed)
TOLD MS. [name] THAT DR. Darrold Span CALLED DR. BRACKBILL'S OFFICE AND SET AN APPT. UP FOR THURS. 06-27-11 AT 0930. DR. Patty Sermons IS GLAD TO HELP MANAGE HER BP MEDS/B BLOCKER NOW.  PT. PLEASED AND IS ABLE TO MAKE APPT. AS SCHEDULED.

## 2011-06-24 NOTE — Telephone Encounter (Signed)
Patient will be set up to see Dr.Gehrig 3-14, with CT the week prior.

## 2011-06-27 ENCOUNTER — Telehealth: Payer: Self-pay

## 2011-06-27 ENCOUNTER — Ambulatory Visit: Payer: BC Managed Care – PPO

## 2011-06-27 ENCOUNTER — Ambulatory Visit (INDEPENDENT_AMBULATORY_CARE_PROVIDER_SITE_OTHER): Payer: BC Managed Care – PPO | Admitting: Cardiology

## 2011-06-27 ENCOUNTER — Other Ambulatory Visit (HOSPITAL_BASED_OUTPATIENT_CLINIC_OR_DEPARTMENT_OTHER): Payer: BC Managed Care – PPO | Admitting: Lab

## 2011-06-27 VITALS — BP 132/80 | HR 72 | Ht 59.0 in | Wt 110.8 lb

## 2011-06-27 DIAGNOSIS — C549 Malignant neoplasm of corpus uteri, unspecified: Secondary | ICD-10-CM

## 2011-06-27 DIAGNOSIS — I959 Hypotension, unspecified: Secondary | ICD-10-CM | POA: Insufficient documentation

## 2011-06-27 DIAGNOSIS — I119 Hypertensive heart disease without heart failure: Secondary | ICD-10-CM

## 2011-06-27 DIAGNOSIS — C541 Malignant neoplasm of endometrium: Secondary | ICD-10-CM

## 2011-06-27 LAB — CBC WITH DIFFERENTIAL/PLATELET
BASO%: 0.3 % (ref 0.0–2.0)
Basophils Absolute: 0 10*3/uL (ref 0.0–0.1)
EOS%: 0.2 % (ref 0.0–7.0)
Eosinophils Absolute: 0 10*3/uL (ref 0.0–0.5)
HCT: 30.9 % — ABNORMAL LOW (ref 34.8–46.6)
HGB: 10.4 g/dL — ABNORMAL LOW (ref 11.6–15.9)
LYMPH%: 17.1 % (ref 14.0–49.7)
MCH: 32.1 pg (ref 25.1–34.0)
MCHC: 33.7 g/dL (ref 31.5–36.0)
MCV: 95.4 fL (ref 79.5–101.0)
MONO#: 0.9 10*3/uL (ref 0.1–0.9)
MONO%: 8.6 % (ref 0.0–14.0)
NEUT#: 7.8 10*3/uL — ABNORMAL HIGH (ref 1.5–6.5)
NEUT%: 73.8 % (ref 38.4–76.8)
Platelets: 110 10*3/uL — ABNORMAL LOW (ref 145–400)
RBC: 3.24 10*6/uL — ABNORMAL LOW (ref 3.70–5.45)
RDW: 17.2 % — ABNORMAL HIGH (ref 11.2–14.5)
WBC: 10.6 10*3/uL — ABNORMAL HIGH (ref 3.9–10.3)
lymph#: 1.8 10*3/uL (ref 0.9–3.3)

## 2011-06-27 NOTE — Patient Instructions (Signed)
Your physician recommends that you continue on your current medications as directed. Please refer to the Current Medication list given to you today.  Follow up as needed  

## 2011-06-27 NOTE — Progress Notes (Signed)
[name] Date of Birth:  Aug 22, 1940 [facility] [address] [place], [place]  [address] [phone]  Fax   [phone]  HPI: This pleasant 71 year old woman is seen as a work in an office visit.  She has a past history of mild essential hypertension and a history of palpitations and a history of hypercholesterolemia.  She also has a prior history of breast cancer and a more recent history of endometrial cancer.  Recently she was found on routine scanning to have some enlarged nodes in the pelvic wall which are cancerous.  She is getting chemotherapy infusions.  At the end her chemotherapeutic session her blood pressures have been remaining low for 4 or 5 days in the range of 98/60.  She comes in today to discuss possible remedies for this post chemotherapy hypotension.  He has not been having any chest pain.  Current Outpatient Prescriptions  Medication Sig Dispense Refill  . Acetylcarnitine HCl (ACETYL-L-CARNITINE HCL) POWD daily.        . ALPHA-LIPOIC ACID 100 MG TABS Take 100 mg by mouth daily.        Marland Kitchen ALPRAZolam (XANAX) 0.25 MG tablet Take 0.25 mg by mouth as needed.       Marland Kitchen aspirin 81 MG tablet Take 81 mg by mouth daily.        Marland Kitchen atenolol (TENORMIN) 25 MG tablet Take 25 mg by mouth 2 (two) times daily. 1/2 Tab twice daily      . Calcium Carbonate-Vitamin D (CALCIUM 600+D PO) Take 1 tablet by mouth 3 (three) times a week.        . cholecalciferol (VITAMIN D) 1000 UNITS tablet Take 1,000 Units by mouth daily.        . Coenzyme Q10 (CO Q10) 100 MG TABS Take 100 mg elemental calcium/kg/hr by mouth daily.        Marland Kitchen dexamethasone (DECADRON) 4 MG tablet TAKE 5 TABS = 20 MG 12 HRS. AND 6 HRS WITH FOOD PRIOR TO TAXOL CHEMOTHERAPY.  10 tablet  0  . fish oil-omega-3 fatty acids 1000 MG capsule Take 1 g by mouth daily.        Marland Kitchen LORazepam (ATIVAN) 0.5 MG tablet TAKE 1 TABLET ORALLY OR UNDER THE TONGUE EVERY 4 HRS. AS NEEDED FOR NAUSEA  20 tablet  0  . Magnesium 250 MG  TABS Take 250 mg by mouth 3 (three) times a week.        . ondansetron (ZOFRAN) 8 MG tablet TAKE 1-2 TABS EVERY 12 HRS AS NEEDED FOR NAUSEA.  30 tablet  2  . simvastatin (ZOCOR) 20 MG tablet Take 1 tablet (20 mg total) by mouth at bedtime.  90 tablet  3   No current facility-administered medications for this visit.   Facility-Administered Medications Ordered in Other Visits  Medication Dose Route Frequency Provider Last Rate Last Dose  . 0.9 %  sodium chloride infusion   Intravenous Once [name], MD      . sodium chloride 0.9 % injection 10 mL  10 mL Intracatheter PRN Lennis Buzzy Han, MD        Allergies  Allergen Reactions  . Ace Inhibitors Anaphylaxis  . Codeine   . Demerol     Patient Active Problem List  Diagnoses  . Hypercholesterolemia  . Palpitations  . Malignant neoplasm of corpus uteri, except isthmus  . Endometrial cancer    History  Smoking status  . Former Smoker  . Types: Cigarettes  .  Quit date: 05/13/1962  Smokeless tobacco  . Never Used    History  Alcohol Use  . 3.0 oz/week  . 5 Glasses of wine per week    Family History  Problem Relation Age of Onset  . Thrombosis Father     coronary thrombosis  . Hypertension Father   . Heart failure Mother   . Coronary artery disease Brother     Review of Systems: The patient denies any heat or cold intolerance.  No weight gain or weight loss.  The patient denies headaches or blurry vision.  There is no cough or sputum production.  The patient denies dizziness.  There is no hematuria or hematochezia.  The patient denies any muscle aches or arthritis.  The patient denies any rash.  The patient denies frequent falling or instability.  There is no history of depression or anxiety.  All other systems were reviewed and are negative.   Physical Exam: Filed Vitals:   06/27/11 0942  BP: 132/80  Pulse: 72   The general appearance reveals a well-developed well-nourished woman in no distress.Pupils equal  and reactive.   Extraocular Movements are full.  There is no scleral icterus.  The mouth and pharynx are normal.  The neck is supple.  The carotids reveal no bruits.  The jugular venous pressure is normal.  The thyroid is not enlarged.  There is no lymphadenopathy.  The chest is clear to percussion and auscultation. There are no rales or rhonchi. Expansion of the chest is symmetrical.  The precordium is quiet.  The first heart sound is normal.  The second heart sound is physiologically split.  There is no murmur gallop rub or click.  There is no abnormal lift or heave.  The abdomen is soft and nontender. Bowel sounds are normal. The liver and spleen are not enlarged. There Are no abdominal masses. There are no bruits.  The pedal pulses are good.  There is no phlebitis or edema.  There is no cyanosis or clubbing.    Assessment / Plan: Continue on present dose of blood pressure medication.  She will be taking the amlodipine and she is on just half a previous dose of atenolol.  She will take extra salt-containing foods and beverages if necessary to support her blood pressure. Recheck here when necessary.

## 2011-06-27 NOTE — Assessment & Plan Note (Signed)
The patient has been receiving chemotherapy about every 3 weeks.  Following the chemotherapy agent infusion her blood pressure remains low for the next 4 or 5 days in the range of 98/64.  She has already cut back on her atenolol to just 12.5 mg twice a day.  She has stopped taking her amlodipine.  If she tries to cut back to just once a day on the atenolol she has palpitations.  Her blood pressure today is satisfactory on her current therapy of atenolol 12.5 mg twice a day and no amlodipine.  We will continue with that dose.  Prior to chemotherapy she may also want to drink some extra Gatorade or salt-containing beverage to help support her blood pressure following the chemotherapy.

## 2011-06-27 NOTE — Telephone Encounter (Signed)
SPOKE WITH MR. [name] AND TOLD HIM THAT MS. [name]'S LABS WERE GOOD FOR CHEMOTHERAPY TOMORROW.  HE WILL LET HER KNOW.  TOLD HIM THAT THERE WAS NO ANSWER AT THE HOME NUMBER.

## 2011-06-28 ENCOUNTER — Ambulatory Visit (HOSPITAL_BASED_OUTPATIENT_CLINIC_OR_DEPARTMENT_OTHER): Payer: BC Managed Care – PPO

## 2011-06-28 DIAGNOSIS — C541 Malignant neoplasm of endometrium: Secondary | ICD-10-CM

## 2011-06-28 DIAGNOSIS — Z5111 Encounter for antineoplastic chemotherapy: Secondary | ICD-10-CM

## 2011-06-28 DIAGNOSIS — C549 Malignant neoplasm of corpus uteri, unspecified: Secondary | ICD-10-CM

## 2011-06-28 MED ORDER — FAMOTIDINE IN NACL 20-0.9 MG/50ML-% IV SOLN
20.0000 mg | Freq: Once | INTRAVENOUS | Status: AC
  Administered 2011-06-28: 20 mg via INTRAVENOUS

## 2011-06-28 MED ORDER — ONDANSETRON 16 MG/50ML IVPB (CHCC)
16.0000 mg | Freq: Once | INTRAVENOUS | Status: AC
  Administered 2011-06-28: 16 mg via INTRAVENOUS

## 2011-06-28 MED ORDER — SODIUM CHLORIDE 0.9 % IV SOLN
392.0000 mg | Freq: Once | INTRAVENOUS | Status: AC
  Administered 2011-06-28: 390 mg via INTRAVENOUS
  Filled 2011-06-28: qty 39

## 2011-06-28 MED ORDER — DIPHENHYDRAMINE HCL 50 MG/ML IJ SOLN: 25.0000 mg | Freq: Once | INTRAMUSCULAR | Status: DC

## 2011-06-28 MED ORDER — PACLITAXEL CHEMO INJECTION 300 MG/50ML
175.0000 mg/m2 | Freq: Once | INTRAVENOUS | Status: AC
  Administered 2011-06-28: 252 mg via INTRAVENOUS
  Filled 2011-06-28: qty 42

## 2011-06-28 MED ORDER — SODIUM CHLORIDE 0.9 % IV SOLN
Freq: Once | INTRAVENOUS | Status: AC
  Administered 2011-06-28: 11:00:00 via INTRAVENOUS

## 2011-06-28 MED ORDER — DIPHENHYDRAMINE HCL 25 MG PO CAPS
25.0000 mg | ORAL_CAPSULE | Freq: Once | ORAL | Status: AC
  Administered 2011-06-28: 25 mg via ORAL

## 2011-06-28 MED ORDER — DEXAMETHASONE SODIUM PHOSPHATE 4 MG/ML IJ SOLN
20.0000 mg | Freq: Once | INTRAMUSCULAR | Status: AC
Start: 2011-06-28 — End: 2011-06-28
  Administered 2011-06-28: 20 mg via INTRAVENOUS

## 2011-06-28 MED ORDER — SODIUM CHLORIDE 0.9 % IJ SOLN
100.0000 ug | Freq: Once | INTRAVENOUS | Status: AC
  Administered 2011-06-28: 0.1 mg via INTRADERMAL
  Filled 2011-06-28: qty 0.01

## 2011-06-28 NOTE — Patient Instructions (Signed)
[facility] Discharge Instructions for Patients Receiving Chemotherapy  Today you received the following chemotherapy agents Taxol/Carbo  To help prevent nausea and vomiting after your treatment, we encourage you to take your nausea medication as directed by MD  If you develop nausea and vomiting that is not controlled by your nausea medication, call the clinic. If it is after clinic hours your family physician or the after hours number for the clinic or go to the Emergency Department.   BELOW ARE SYMPTOMS THAT SHOULD BE REPORTED IMMEDIATELY:  *FEVER GREATER THAN 100.5 F  *CHILLS WITH OR WITHOUT FEVER  NAUSEA AND VOMITING THAT IS NOT CONTROLLED WITH YOUR NAUSEA MEDICATION  *UNUSUAL SHORTNESS OF BREATH  *UNUSUAL BRUISING OR BLEEDING  TENDERNESS IN MOUTH AND THROAT WITH OR WITHOUT PRESENCE OF ULCERS  *URINARY PROBLEMS  *BOWEL PROBLEMS  UNUSUAL RASH Items with * indicate a potential emergency and should be followed up as soon as possible.  One of the nurses will contact you 24 hours after your first treatment. Please let the nurse know about any problems that you may have experienced. Feel free to call the clinic you have any questions or concerns. The clinic phone number is [phone].   I have been informed and understand all the instructions given to me. I know to contact the clinic, my physician, or go to the Emergency Department if any problems should occur. I do not have any questions at this time, but understand that I may call the clinic during office hours   should I have any questions or need assistance in obtaining follow up care.    __________________________________________  _____________  __________ Signature of Patient or Authorized Representative            Date                   Time    __________________________________________ Nurse's Signature

## 2011-06-28 NOTE — Progress Notes (Signed)
1105 carbo test dose given right forearm rechecked site 5, 15 and 30 mins after administration. Site negative x3 will proceed with treatment. Pt with no complaints

## 2011-07-04 ENCOUNTER — Ambulatory Visit (HOSPITAL_BASED_OUTPATIENT_CLINIC_OR_DEPARTMENT_OTHER): Payer: BC Managed Care – PPO

## 2011-07-04 VITALS — BP 130/69 | HR 78 | Temp 99.0°F

## 2011-07-04 DIAGNOSIS — C541 Malignant neoplasm of endometrium: Secondary | ICD-10-CM

## 2011-07-04 DIAGNOSIS — C549 Malignant neoplasm of corpus uteri, unspecified: Secondary | ICD-10-CM

## 2011-07-04 DIAGNOSIS — Z5189 Encounter for other specified aftercare: Secondary | ICD-10-CM

## 2011-07-04 MED ORDER — PEGFILGRASTIM INJECTION 6 MG/0.6ML
6.0000 mg | Freq: Once | SUBCUTANEOUS | Status: AC
  Administered 2011-07-04: 6 mg via SUBCUTANEOUS
  Filled 2011-07-04: qty 0.6

## 2011-07-14 ENCOUNTER — Encounter: Payer: Self-pay | Admitting: Oncology

## 2011-07-14 ENCOUNTER — Other Ambulatory Visit: Payer: Self-pay | Admitting: Oncology

## 2011-07-14 NOTE — Progress Notes (Signed)
Patient is not to have chemo 07-18-11. Order sent to schedulers now to cancel that appointment.

## 2011-07-17 ENCOUNTER — Telehealth: Payer: Self-pay | Admitting: *Deleted

## 2011-07-17 ENCOUNTER — Ambulatory Visit (HOSPITAL_COMMUNITY)
Admission: RE | Admit: 2011-07-17 | Discharge: 2011-07-17 | Disposition: A | Discharge status: Home / Self Care | Payer: BC Managed Care – PPO | Source: Ambulatory Visit | Attending: Oncology | Admitting: Oncology

## 2011-07-17 DIAGNOSIS — Z923 Personal history of irradiation: Secondary | ICD-10-CM | POA: Insufficient documentation

## 2011-07-17 DIAGNOSIS — C55 Malignant neoplasm of uterus, part unspecified: Secondary | ICD-10-CM | POA: Insufficient documentation

## 2011-07-17 DIAGNOSIS — K8020 Calculus of gallbladder without cholecystitis without obstruction: Secondary | ICD-10-CM | POA: Insufficient documentation

## 2011-07-17 DIAGNOSIS — M47814 Spondylosis without myelopathy or radiculopathy, thoracic region: Secondary | ICD-10-CM | POA: Insufficient documentation

## 2011-07-17 DIAGNOSIS — Z79899 Other long term (current) drug therapy: Secondary | ICD-10-CM | POA: Insufficient documentation

## 2011-07-17 DIAGNOSIS — R197 Diarrhea, unspecified: Secondary | ICD-10-CM | POA: Insufficient documentation

## 2011-07-17 DIAGNOSIS — C541 Malignant neoplasm of endometrium: Secondary | ICD-10-CM

## 2011-07-17 DIAGNOSIS — M4120 Other idiopathic scoliosis, site unspecified: Secondary | ICD-10-CM | POA: Insufficient documentation

## 2011-07-17 MED ORDER — IOHEXOL 300 MG/ML  SOLN
100.0000 mL | Freq: Once | INTRAMUSCULAR | Status: AC | PRN
  Administered 2011-07-17: 100 mL via INTRAVENOUS

## 2011-07-17 NOTE — Telephone Encounter (Signed)
Pt notified of CT results. RTC 07/25/11 @ 1530

## 2011-07-18 ENCOUNTER — Ambulatory Visit: Payer: BC Managed Care – PPO

## 2011-07-25 ENCOUNTER — Ambulatory Visit: Payer: BC Managed Care – PPO | Attending: Gynecologic Oncology | Admitting: Gynecologic Oncology

## 2011-07-25 ENCOUNTER — Encounter: Payer: Self-pay | Admitting: Gynecologic Oncology

## 2011-07-25 VITALS — BP 142/64 | HR 72 | Temp 97.8°F | Resp 16 | Ht 59.0 in | Wt 109.6 lb

## 2011-07-25 DIAGNOSIS — C541 Malignant neoplasm of endometrium: Secondary | ICD-10-CM

## 2011-07-25 DIAGNOSIS — Z9221 Personal history of antineoplastic chemotherapy: Secondary | ICD-10-CM | POA: Insufficient documentation

## 2011-07-25 DIAGNOSIS — I10 Essential (primary) hypertension: Secondary | ICD-10-CM | POA: Insufficient documentation

## 2011-07-25 DIAGNOSIS — Z87891 Personal history of nicotine dependence: Secondary | ICD-10-CM | POA: Insufficient documentation

## 2011-07-25 DIAGNOSIS — Z90710 Acquired absence of both cervix and uterus: Secondary | ICD-10-CM | POA: Insufficient documentation

## 2011-07-25 DIAGNOSIS — Z853 Personal history of malignant neoplasm of breast: Secondary | ICD-10-CM | POA: Insufficient documentation

## 2011-07-25 DIAGNOSIS — Z7982 Long term (current) use of aspirin: Secondary | ICD-10-CM | POA: Insufficient documentation

## 2011-07-25 DIAGNOSIS — C55 Malignant neoplasm of uterus, part unspecified: Secondary | ICD-10-CM | POA: Insufficient documentation

## 2011-07-25 NOTE — Patient Instructions (Signed)
Followup Dr. Darrold Span when they contact you.

## 2011-07-25 NOTE — Progress Notes (Signed)
Consult Note: Gyn-Onc  [name] 71 y.o. female  CC:  Chief Complaint  Patient presents with  . Endometrial cancer    Follow up    HPI: This is a very pleasant 71 year old with history of stage IB uterine papillary serous carcinoma. She underwent a laparoscopic hysterectomy and appropriate staging 2009. Pathology revealed a papillary serous carcinoma with 36% myometrial invasion, 15 negative lymph nodes. She did have lymphovascular space involvement. Preoperative CT scan was negative and her CA 125 at the time of diagnosis was normal at 7.1. Postop really should receive 6 cycles of paclitaxel and carboplatin-based chemotherapy with vaginal cuff brachytherapy. Her last cycle of chemotherapy was in January of 2010 inches a negative posttreatment CT scan. I last saw her in Nov. or 2012 at which time her exam and Pap smear were unremarkable. At that time, she was complaining of some early satiety and abdominal discomfort since the summer appear. She has a CT scan done that revealed recurrent disease along the left obturator node chain. One set of lymph nodes measured 3 x 2 cm and the other area measured 2 x 2 centimeters. There is no peritoneal disease, there is no evidence of any other abdominal disease.   Interval History:  After discussion she opted for attempt at surgical debulking at [facility] On December 13 she underwent robotic evaluation. At that time she does have in the retroperitoneum on the left side. There is a large nodal mass encasing the obturator nerve obturator vein internal iliac vasculature and left ureter. We performed a left ureteral lysis. After the identified structures it was clear that this was not [place]. The procedure at that time was aborted. There is no pathology submitted at that time. It was recommended that she undergo retreatment with paclitaxel and carboplatin she has undergone 3 cycles of paclitaxel and carboplatin.  CT revealed:  The previously demonstrated left  pelvic side wall mass has decreased in size, now measuring 1.9 x 2.0 cm on image 54 (previously 2.5 x 2.2 cm as remeasured). This measures approximately 3.4 cm cephalocaudad compared with 5.4 cm previously. No new or enlarging pelvic masses are identified. There is no ascites or peritoneal nodularity. There is no retroperitoneal lymphadenopathy. Asymmetric bladder wall thickening on the right may be related to incomplete distension and/or radiation therapy. The vaginal cuff appears stable.  No osseous metastases are identified. A thoracolumbar scoliosis and associated spondylosis are unchanged.  IMPRESSION:  1. Interval improvement in the previously demonstrated left pelvic local recurrence of tumor.  2. No disease progression or complication identified.  3. Mild bladder wall thickening on the right, nonspecific and possibly related to incomplete distension and/or radiation therapy.  4. Cholelithiasis.   Interval History: I. personally reviewed her CT scans that showed the scans to the patient and her husband on the computers. While there has been an improvement is very clear that the left ureter continues to go directly into this mass. After seeing the amount of fibrosis as she has in the condition of the ureter on that side at the last surgical attempt I believe that in order to remove this would require at least resection of the ureter with reimplantation. In addition she might require some type of her resection of the obturator nerve with significant sequela.   We discussed these issues today and feel it would be more prudent to proceed with an additional 3 cycles of chemotherapy she's overall tolerating them quite well. After that we will get a CT scan again  evaluate her disease if her disease visually significantly smaller blue there may be a role for some pelvic radiation to that area. Again I showed the films to the patient and her husband. 25 minutes face to face time was spent with them. They do  have a trip to the beach planned from April 21 through May 5. We have contacted Dr. Precious Reel office to make contact with the patient for her next cycle of chemotherapy.  Review of Systems  Current Meds:  Outpatient Encounter Prescriptions as of 07/25/2011  Medication Sig Dispense Refill  . Acetylcarnitine HCl (ACETYL-L-CARNITINE HCL) POWD daily.        . ALPHA-LIPOIC ACID 100 MG TABS Take 100 mg by mouth daily.        Marland Kitchen ALPRAZolam (XANAX) 0.25 MG tablet Take 0.25 mg by mouth as needed.       Marland Kitchen aspirin 81 MG tablet Take 81 mg by mouth daily.        Marland Kitchen atenolol (TENORMIN) 25 MG tablet Take 25 mg by mouth 2 (two) times daily. 1/2 Tab twice daily      . Calcium Carbonate-Vitamin D (CALCIUM 600+D PO) Take 1 tablet by mouth 3 (three) times a week.        . cholecalciferol (VITAMIN D) 1000 UNITS tablet Take 1,000 Units by mouth daily.        . Coenzyme Q10 (CO Q10) 100 MG TABS Take 100 mg elemental calcium/kg/hr by mouth daily.        Marland Kitchen dexamethasone (DECADRON) 4 MG tablet TAKE 5 TABS = 20 MG 12 HRS. AND 6 HRS WITH FOOD PRIOR TO TAXOL CHEMOTHERAPY.  10 tablet  0  . fish oil-omega-3 fatty acids 1000 MG capsule Take 1 g by mouth daily.        Marland Kitchen LORazepam (ATIVAN) 0.5 MG tablet TAKE 1 TABLET ORALLY OR UNDER THE TONGUE EVERY 4 HRS. AS NEEDED FOR NAUSEA  20 tablet  0  . Magnesium 250 MG TABS Take 250 mg by mouth 3 (three) times a week.        . ondansetron (ZOFRAN) 8 MG tablet TAKE 1-2 TABS EVERY 12 HRS AS NEEDED FOR NAUSEA.  30 tablet  2  . simvastatin (ZOCOR) 20 MG tablet Take 1 tablet (20 mg total) by mouth at bedtime.  90 tablet  3   Facility-Administered Encounter Medications as of 07/25/2011  Medication Dose Route Frequency Provider Last Rate Last Dose  . 0.9 %  sodium chloride infusion   Intravenous Once [name], MD      . sodium chloride 0.9 % injection 10 mL  10 mL Intracatheter PRN Lennis Buzzy Han, MD        Allergy:  Allergies  Allergen Reactions  . Ace Inhibitors Anaphylaxis    . Codeine Nausea Only  . Demerol Nausea Only    Social Hx:   History   Social History  . Marital Status: Married    Spouse Name: N/A    Number of Children: N/A  . Years of Education: N/A   Occupational History  . Not on file.   Social History Main Topics  . Smoking status: Former Smoker    Types: Cigarettes    Quit date: 05/13/1962  . Smokeless tobacco: Never Used  . Alcohol Use: 3.0 oz/week    5 Glasses of wine per week  . Drug Use: No  . Sexually Active: No   Other Topics Concern  . Not on file   Social History Narrative  .  No narrative on file    Past Surgical Hx:  Past Surgical History  Procedure Date  . Total abdominal hysterectomy w/ bilateral salpingoophorectomy   . Breast lumpectomy   . Appendectomy   . Tonsillectomy   . Foot surgery     RIGHT    Past Medical Hx:  Past Medical History  Diagnosis Date  . Hypertension   . Palpitations   . History of breast cancer 2000    right breast -- treated with lumpectomy and radiation therarpy, postoperatively with tamoxifen   . Endometrial cancer 12/2007    s/p total abdominal hysterectomy at [facility] - ovaries were also removed   . Hyperlipidemia   . Hypercholesterolemia   . PVC's (premature ventricular contractions)     Family Hx:  Family History  Problem Relation Age of Onset  . Thrombosis Father     coronary thrombosis  . Hypertension Father   . Heart failure Mother   . Coronary artery disease Brother     Vitals:  Blood pressure 142/64, pulse 72, temperature 97.8 F (36.6 C), resp. rate 16, height 4\' 11"  (1.499 m), weight 109 lb 9.6 oz (49.714 kg).  Physical Exam:  Well-nourished well-developed female in no acute distress. Assessment/Plan: 71 year old with recurrent uterine serous carcinoma. She has had no recurrence on the left pelvic sidewall. She's undergone 3 cycles of paclitaxel and carboplatin with benefit. Would recommend an additional 3 cycles. We would recommend to repeat imaging  after 3 cycles for consideration of radiation therapy.  [name] A., MD 07/25/2011, 4:09 PM

## 2011-07-26 ENCOUNTER — Other Ambulatory Visit: Payer: Self-pay

## 2011-07-26 ENCOUNTER — Telehealth: Payer: Self-pay | Admitting: Oncology

## 2011-07-26 ENCOUNTER — Encounter: Payer: Self-pay | Admitting: Oncology

## 2011-07-26 DIAGNOSIS — C549 Malignant neoplasm of corpus uteri, unspecified: Secondary | ICD-10-CM

## 2011-07-26 NOTE — Progress Notes (Signed)
Saw Dr.Gehrig 07-25-11 and needs 3 more cycles taxol/carbo + carbo skin test. RN to speak with pt re scheduling; needs new chemistries in EMR for chemo orders.

## 2011-07-26 NOTE — Progress Notes (Signed)
MS. Bellis AWARE OF THE LAB APPT. 07-29-11, TREATMENT 07-30-11, NEULASTA INJECTION ON 08-06-11, AND FOLLOW UP VISIT ON 08-13-11 AT 0930 WITH LAB AT 0900. PT. STATED THAT SHE HAS ENOUGH DECADRON FOR TREATMENT ON 07-30-11

## 2011-07-26 NOTE — Telephone Encounter (Signed)
Talked to pt, she is aware of all appt for March and April

## 2011-07-27 ENCOUNTER — Other Ambulatory Visit: Payer: Self-pay | Admitting: Oncology

## 2011-07-28 ENCOUNTER — Other Ambulatory Visit: Payer: Self-pay | Admitting: Oncology

## 2011-07-29 ENCOUNTER — Other Ambulatory Visit (HOSPITAL_BASED_OUTPATIENT_CLINIC_OR_DEPARTMENT_OTHER): Payer: BC Managed Care – PPO

## 2011-07-29 ENCOUNTER — Other Ambulatory Visit: Payer: Self-pay | Admitting: Oncology

## 2011-07-29 DIAGNOSIS — C549 Malignant neoplasm of corpus uteri, unspecified: Secondary | ICD-10-CM

## 2011-07-29 DIAGNOSIS — C50419 Malignant neoplasm of upper-outer quadrant of unspecified female breast: Secondary | ICD-10-CM

## 2011-07-29 LAB — CBC WITH DIFFERENTIAL/PLATELET
BASO%: 0.4 % (ref 0.0–2.0)
Basophils Absolute: 0 10*3/uL (ref 0.0–0.1)
EOS%: 1.8 % (ref 0.0–7.0)
Eosinophils Absolute: 0.1 10*3/uL (ref 0.0–0.5)
HCT: 31.5 % — ABNORMAL LOW (ref 34.8–46.6)
HGB: 10.7 g/dL — ABNORMAL LOW (ref 11.6–15.9)
LYMPH%: 26.4 % (ref 14.0–49.7)
MCH: 34.8 pg — ABNORMAL HIGH (ref 25.1–34.0)
MCHC: 33.8 g/dL (ref 31.5–36.0)
MCV: 103 fL — ABNORMAL HIGH (ref 79.5–101.0)
MONO#: 0.5 10*3/uL (ref 0.1–0.9)
MONO%: 11.2 % (ref 0.0–14.0)
NEUT#: 2.7 10*3/uL (ref 1.5–6.5)
NEUT%: 60.2 % (ref 38.4–76.8)
Platelets: 158 10*3/uL (ref 145–400)
RBC: 3.07 10*6/uL — ABNORMAL LOW (ref 3.70–5.45)
RDW: 21.2 % — ABNORMAL HIGH (ref 11.2–14.5)
WBC: 4.4 10*3/uL (ref 3.9–10.3)
lymph#: 1.2 10*3/uL (ref 0.9–3.3)

## 2011-07-29 LAB — COMPREHENSIVE METABOLIC PANEL
ALT: 13 U/L (ref 0–35)
AST: 26 U/L (ref 0–37)
Albumin: 3.7 g/dL (ref 3.5–5.2)
Alkaline Phosphatase: 65 U/L (ref 39–117)
BUN: 19 mg/dL (ref 6–23)
CO2: 28 mEq/L (ref 19–32)
Calcium: 9.3 mg/dL (ref 8.4–10.5)
Chloride: 102 mEq/L (ref 96–112)
Creatinine, Ser: 0.64 mg/dL (ref 0.50–1.10)
Glucose, Bld: 63 mg/dL — ABNORMAL LOW (ref 70–99)
Potassium: 3.7 mEq/L (ref 3.5–5.3)
Sodium: 139 mEq/L (ref 135–145)
Total Bilirubin: 0.6 mg/dL (ref 0.3–1.2)
Total Protein: 6.9 g/dL (ref 6.0–8.3)

## 2011-07-30 ENCOUNTER — Ambulatory Visit (HOSPITAL_BASED_OUTPATIENT_CLINIC_OR_DEPARTMENT_OTHER): Payer: BC Managed Care – PPO

## 2011-07-30 VITALS — BP 145/75 | HR 67 | Temp 97.0°F

## 2011-07-30 DIAGNOSIS — Z5111 Encounter for antineoplastic chemotherapy: Secondary | ICD-10-CM

## 2011-07-30 DIAGNOSIS — C549 Malignant neoplasm of corpus uteri, unspecified: Secondary | ICD-10-CM

## 2011-07-30 DIAGNOSIS — C541 Malignant neoplasm of endometrium: Secondary | ICD-10-CM

## 2011-07-30 MED ORDER — SODIUM CHLORIDE 0.9 % IV SOLN: Freq: Once | INTRAVENOUS | Status: DC

## 2011-07-30 MED ORDER — SODIUM CHLORIDE 0.9 % IJ SOLN
10.0000 mL | INTRAMUSCULAR | Status: DC | PRN
  Filled 2011-07-30: qty 10

## 2011-07-30 MED ORDER — ONDANSETRON 16 MG/50ML IVPB (CHCC)
16.0000 mg | Freq: Once | INTRAVENOUS | Status: AC
  Administered 2011-07-30: 16 mg via INTRAVENOUS

## 2011-07-30 MED ORDER — CARBOPLATIN CHEMO INJECTION 450 MG/45ML
392.0000 mg | Freq: Once | INTRAVENOUS | Status: AC
  Administered 2011-07-30: 390 mg via INTRAVENOUS
  Filled 2011-07-30: qty 39

## 2011-07-30 MED ORDER — DEXAMETHASONE SODIUM PHOSPHATE 4 MG/ML IJ SOLN
20.0000 mg | Freq: Once | INTRAMUSCULAR | Status: AC
  Administered 2011-07-30: 20 mg via INTRAVENOUS

## 2011-07-30 MED ORDER — DIPHENHYDRAMINE HCL 25 MG PO CAPS
25.0000 mg | ORAL_CAPSULE | Freq: Once | ORAL | Status: AC
  Administered 2011-07-30: 25 mg via ORAL

## 2011-07-30 MED ORDER — FAMOTIDINE IN NACL 20-0.9 MG/50ML-% IV SOLN
20.0000 mg | Freq: Once | INTRAVENOUS | Status: AC
  Administered 2011-07-30: 20 mg via INTRAVENOUS

## 2011-07-30 MED ORDER — SODIUM CHLORIDE 0.9 % IJ SOLN
100.0000 ug | Freq: Once | INTRAVENOUS | Status: AC
  Administered 2011-07-30: 0.1 mg via INTRADERMAL
  Filled 2011-07-30: qty 0.01

## 2011-07-30 MED ORDER — PACLITAXEL CHEMO INJECTION 300 MG/50ML
175.0000 mg/m2 | Freq: Once | INTRAVENOUS | Status: AC
  Administered 2011-07-30: 252 mg via INTRAVENOUS
  Filled 2011-07-30: qty 42

## 2011-07-30 MED ORDER — HEPARIN SOD (PORK) LOCK FLUSH 100 UNIT/ML IV SOLN
500.0000 [IU] | Freq: Once | INTRAVENOUS | Status: DC | PRN
  Filled 2011-07-30: qty 5

## 2011-07-30 NOTE — Patient Instructions (Signed)
[facility] Discharge Instructions for Patients Receiving Chemotherapy  Today you received the following chemotherapy agents Carboplatin and Taxol.  To help prevent nausea and vomiting after your treatment, we encourage you to take your nausea medication as prescribed by your physician.  If you develop nausea and vomiting that is not controlled by your nausea medication, call the clinic. If it is after clinic hours your family physician or the after hours number for the clinic or go to the Emergency Department.   BELOW ARE SYMPTOMS THAT SHOULD BE REPORTED IMMEDIATELY:  *FEVER GREATER THAN 100.5 F  *CHILLS WITH OR WITHOUT FEVER  NAUSEA AND VOMITING THAT IS NOT CONTROLLED WITH YOUR NAUSEA MEDICATION  *UNUSUAL SHORTNESS OF BREATH  *UNUSUAL BRUISING OR BLEEDING  TENDERNESS IN MOUTH AND THROAT WITH OR WITHOUT PRESENCE OF ULCERS  *URINARY PROBLEMS  *BOWEL PROBLEMS  UNUSUAL RASH Items with * indicate a potential emergency and should be followed up as soon as possible.  Feel free to call the clinic you have any questions or concerns. The clinic phone number is [phone].   I have been informed and understand all the instructions given to me. I know to contact the clinic, my physician, or go to the Emergency Department if any problems should occur. I do not have any questions at this time, but understand that I may call the clinic during office hours   should I have any questions or need assistance in obtaining follow up care.    __________________________________________  _____________  __________ Signature of Patient or Authorized Representative            Date                   Time    __________________________________________ Nurse's Signature

## 2011-07-30 NOTE — Progress Notes (Signed)
Carboplatin test dose completed at 1102. No signs of concerns at 1105,1112, and 1117. Rest of orders released. DS

## 2011-07-31 ENCOUNTER — Other Ambulatory Visit: Payer: Self-pay | Admitting: Oncology

## 2011-08-05 ENCOUNTER — Other Ambulatory Visit: Payer: Self-pay | Admitting: *Deleted

## 2011-08-05 ENCOUNTER — Ambulatory Visit: Payer: BC Managed Care – PPO | Admitting: Radiation Oncology

## 2011-08-06 ENCOUNTER — Ambulatory Visit (HOSPITAL_BASED_OUTPATIENT_CLINIC_OR_DEPARTMENT_OTHER): Payer: BC Managed Care – PPO

## 2011-08-06 VITALS — BP 124/76 | HR 69 | Temp 98.1°F

## 2011-08-06 DIAGNOSIS — C549 Malignant neoplasm of corpus uteri, unspecified: Secondary | ICD-10-CM

## 2011-08-06 DIAGNOSIS — C541 Malignant neoplasm of endometrium: Secondary | ICD-10-CM

## 2011-08-06 DIAGNOSIS — Z5189 Encounter for other specified aftercare: Secondary | ICD-10-CM

## 2011-08-06 MED ORDER — PEGFILGRASTIM INJECTION 6 MG/0.6ML
6.0000 mg | Freq: Once | SUBCUTANEOUS | Status: AC
  Administered 2011-08-06: 6 mg via SUBCUTANEOUS
  Filled 2011-08-06: qty 0.6

## 2011-08-13 ENCOUNTER — Encounter: Payer: Self-pay | Admitting: Oncology

## 2011-08-13 ENCOUNTER — Telehealth: Payer: Self-pay | Admitting: Oncology

## 2011-08-13 ENCOUNTER — Other Ambulatory Visit (HOSPITAL_BASED_OUTPATIENT_CLINIC_OR_DEPARTMENT_OTHER): Payer: BC Managed Care – PPO

## 2011-08-13 ENCOUNTER — Ambulatory Visit (HOSPITAL_BASED_OUTPATIENT_CLINIC_OR_DEPARTMENT_OTHER): Payer: BC Managed Care – PPO | Admitting: Oncology

## 2011-08-13 VITALS — BP 151/76 | HR 69 | Temp 97.8°F | Ht 59.0 in | Wt 110.7 lb

## 2011-08-13 DIAGNOSIS — C549 Malignant neoplasm of corpus uteri, unspecified: Secondary | ICD-10-CM

## 2011-08-13 DIAGNOSIS — C50419 Malignant neoplasm of upper-outer quadrant of unspecified female breast: Secondary | ICD-10-CM

## 2011-08-13 DIAGNOSIS — C541 Malignant neoplasm of endometrium: Secondary | ICD-10-CM

## 2011-08-13 DIAGNOSIS — I10 Essential (primary) hypertension: Secondary | ICD-10-CM

## 2011-08-13 LAB — CBC WITH DIFFERENTIAL/PLATELET
BASO%: 0.8 % (ref 0.0–2.0)
Basophils Absolute: 0.2 10*3/uL — ABNORMAL HIGH (ref 0.0–0.1)
EOS%: 0.4 % (ref 0.0–7.0)
Eosinophils Absolute: 0.1 10*3/uL (ref 0.0–0.5)
HCT: 31.6 % — ABNORMAL LOW (ref 34.8–46.6)
HGB: 10.5 g/dL — ABNORMAL LOW (ref 11.6–15.9)
LYMPH%: 9.5 % — ABNORMAL LOW (ref 14.0–49.7)
MCH: 35.3 pg — ABNORMAL HIGH (ref 25.1–34.0)
MCHC: 33.3 g/dL (ref 31.5–36.0)
MCV: 106 fL — ABNORMAL HIGH (ref 79.5–101.0)
MONO#: 0.8 10*3/uL (ref 0.1–0.9)
MONO%: 4.4 % (ref 0.0–14.0)
NEUT#: 15.7 10*3/uL — ABNORMAL HIGH (ref 1.5–6.5)
NEUT%: 84.9 % — ABNORMAL HIGH (ref 38.4–76.8)
Platelets: 105 10*3/uL — ABNORMAL LOW (ref 145–400)
RBC: 2.98 10*6/uL — ABNORMAL LOW (ref 3.70–5.45)
RDW: 18.9 % — ABNORMAL HIGH (ref 11.2–14.5)
WBC: 18.5 10*3/uL — ABNORMAL HIGH (ref 3.9–10.3)
lymph#: 1.8 10*3/uL (ref 0.9–3.3)
nRBC: 0 % (ref 0–0)

## 2011-08-13 NOTE — Telephone Encounter (Signed)
Gv pt appt for april-may2013 

## 2011-08-13 NOTE — Progress Notes (Signed)
OFFICE PROGRESS NOTE Date of Visit 08-13-2011 Physicians [name], [name], [name], [name], [name]  INTERVAL HISTORY:  Patient is seen, alone for visit, in continuing attention to her recurrent endometrial carcinoma for which she is continuing chemotherapy with taxol and carboplatin, #4 treatment since recurrence given 3-19 with neulasta 08-06-2011.  History is of microinvasive T1N0 right breast cancer, diagnosed in 03/1999 and treated with lumpectomy with 2 sentinel node evaluation, local radiation and 5 years of Tamoxifen thru 05/2004, then aromatase inhibitor 07/2004 thru 04/2006. 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 She had 6 cycles of taxol/carboplatin through Jan 2010, and vaginal cuff brachytherapy. She did well until some vague LUQ symptoms in Nov. 2012, with CT AP in [facility] system Mar 22, 2011 showing left pelvic sidewall involvement. She was taken to laparoscopic evaluation at [facility] by Dr.Gehrig 04-25-2011, with finding of dense fibrosis in the area as well as mass encasing obturator nerve and vein as well as internal iliac vasculature; left ureterolysis was performed. No pathology was submitted from that procedure. We proceeded with initial 3 additional cycles of taxol/ carboplatin (with carbo skin tests) from 05-16-2011 thru 06-28-2011, then repeated CT AP on 07-17-2011. The CT showed improvement in the left pelvic sidewall mass now 1.9 x 2 cm and 3.4 cm cephalocaudad, as compared with 2.5 x 2.2 cm and 5.4 cm on CT 03-22-11, and no progressive disease elsewhere.  She saw Dr.Gehrig after the CT, with recommendation for an additonal 3 cycles of same chemotherapy then repeat scan.  Patient has done better overall with most recent treatment, possibly in part due to good oral hydration. She kept bowels moving with stool softner + laxative for 2-3 days and  used zofran regularly also for ~ 2 days after treatment. She has no significant peripheral neuropathy symptoms. She has  no abdominal or pelvic discomfort, has had no fever or symptoms of infection, no bleeding. Blood pressure was 120 systolic this am at home. She and husband have a beach trip 4-21 thru 09-15-2011 and we will adjust timing of chemotherapy to allow this. Remainder of 10 point Review of Systems negative/ unchanged.     Objective:  Vital signs in last 24 hours:  BP 151/76  Pulse 69  Temp(Src) 97.8 F (36.6 C) (Oral)  Ht 4\' 11"  (1.499 m)  Wt 110 lb 11.2 oz (50.213 kg)  BMI 22.36 kg/m2. Weight is stable. Complete alopecia. Alert, looks more comfortable today, easily mobile. HEENT:mucous membranes moist, pharynx normal without lesions.PERRL.  LymphaticsCervical, supraclavicular, and axillary nodes normal.No inguinal adenopathy Resp: clear to auscultation bilaterally and normal percussion bilaterally Cardio: regular rate and rhythm GI: soft, non-tender; bowel sounds normal; no masses,  no organomegaly, not distended Extremities: extremities normal, atraumatic, no cyanosis or edema Neuro nonfocal Skin without rash or ecchymosis  No Portacath/PICC  Lab Results:   Basename 08/13/11 0901  WBC 18.5*  HGB 10.5*  HCT 31.6*  PLT 105*  ANC 15.7 post neulasta RDW 18.9  BMET No results found for this basename: NA:2,K:2,CL:2,CO2:2,GLUCOSE:2,BUN:2,CREATININE:2,CALCIUM:2 in the last 72 hours Last CMET 3-18 Studies/Results:  No results found.  Medications: I have reviewed the patient's current medications. She continues tenormin but is no longer on norvasc.  Assessment/Plan: 1.Recurrent endometrial carcinoma: history as above. Continuing taxol/carbo for another 2 cycles, then repeat scans. She does have [place] skin test prior to each [place]. We will recheck CBC 4-8 and treat  4-9 if ANC >=1.5 and plt >=100k. She will have neulasta 4-16. She expects to be at beach 4-21 thru  5-5.  I will see her 5-6 and she will be due #6 cycle on 5-7. 2.Stage 1 right breast cancer, not known recurrent. She is scheduled for mammograms 10-02-11. 3.hx tachycardia and some HTN, but seems to be tolerating chemotherapy some better off norvasc. Dr.Brackbill's assistance appreciated.  Patient was in agreement with plan as above.  [name] P, MD   08/13/2011, 10:00 AM

## 2011-08-13 NOTE — Patient Instructions (Signed)
Will check CBC 4-8 to be sure good for treatment 4-9

## 2011-08-17 ENCOUNTER — Other Ambulatory Visit: Payer: Self-pay | Admitting: Oncology

## 2011-08-19 ENCOUNTER — Other Ambulatory Visit: Payer: Self-pay

## 2011-08-19 ENCOUNTER — Other Ambulatory Visit (HOSPITAL_BASED_OUTPATIENT_CLINIC_OR_DEPARTMENT_OTHER): Payer: BC Managed Care – PPO | Admitting: Lab

## 2011-08-19 DIAGNOSIS — C541 Malignant neoplasm of endometrium: Secondary | ICD-10-CM

## 2011-08-19 DIAGNOSIS — C549 Malignant neoplasm of corpus uteri, unspecified: Secondary | ICD-10-CM

## 2011-08-19 LAB — CBC WITH DIFFERENTIAL/PLATELET
BASO%: 0.9 % (ref 0.0–2.0)
Basophils Absolute: 0.1 10*3/uL (ref 0.0–0.1)
EOS%: 0.5 % (ref 0.0–7.0)
Eosinophils Absolute: 0 10*3/uL (ref 0.0–0.5)
HCT: 29.5 % — ABNORMAL LOW (ref 34.8–46.6)
HGB: 9.9 g/dL — ABNORMAL LOW (ref 11.6–15.9)
LYMPH%: 16.2 % (ref 14.0–49.7)
MCH: 35.3 pg — ABNORMAL HIGH (ref 25.1–34.0)
MCHC: 33.5 g/dL (ref 31.5–36.0)
MCV: 105.4 fL — ABNORMAL HIGH (ref 79.5–101.0)
MONO#: 0.8 10*3/uL (ref 0.1–0.9)
MONO%: 8.4 % (ref 0.0–14.0)
NEUT#: 7 10*3/uL — ABNORMAL HIGH (ref 1.5–6.5)
NEUT%: 74 % (ref 38.4–76.8)
Platelets: 87 10*3/uL — ABNORMAL LOW (ref 145–400)
RBC: 2.8 10*6/uL — ABNORMAL LOW (ref 3.70–5.45)
RDW: 17.9 % — ABNORMAL HIGH (ref 11.2–14.5)
WBC: 9.5 10*3/uL (ref 3.9–10.3)
lymph#: 1.5 10*3/uL (ref 0.9–3.3)
nRBC: 0 % (ref 0–0)

## 2011-08-19 LAB — COMPREHENSIVE METABOLIC PANEL
ALT: 20 U/L (ref 0–35)
AST: 32 U/L (ref 0–37)
Albumin: 4.1 g/dL (ref 3.5–5.2)
Alkaline Phosphatase: 92 U/L (ref 39–117)
BUN: 18 mg/dL (ref 6–23)
CO2: 25 mEq/L (ref 19–32)
Calcium: 9.3 mg/dL (ref 8.4–10.5)
Chloride: 105 mEq/L (ref 96–112)
Creatinine, Ser: 0.68 mg/dL (ref 0.50–1.10)
Glucose, Bld: 89 mg/dL (ref 70–99)
Potassium: 3.7 mEq/L (ref 3.5–5.3)
Sodium: 141 mEq/L (ref 135–145)
Total Bilirubin: 0.8 mg/dL (ref 0.3–1.2)
Total Protein: 6.2 g/dL (ref 6.0–8.3)

## 2011-08-20 ENCOUNTER — Ambulatory Visit: Payer: BC Managed Care – PPO

## 2011-08-20 ENCOUNTER — Telehealth: Payer: Self-pay

## 2011-08-20 NOTE — Telephone Encounter (Signed)
Gave [name] the appts for next week lab, treat, injection.  Pt. Verbalized understanding.

## 2011-08-21 ENCOUNTER — Encounter: Payer: Self-pay | Admitting: Oncology

## 2011-08-21 ENCOUNTER — Other Ambulatory Visit: Payer: Self-pay | Admitting: Oncology

## 2011-08-21 NOTE — Progress Notes (Signed)
Chemotherapy delayed from 08-20-2011 due to plt 87k on 08-19-2011. Taxol dose decreased to 135 mg/m2 and carboplatin decreased to AUC = 4 due to cytopenias.

## 2011-08-26 ENCOUNTER — Other Ambulatory Visit: Payer: Self-pay

## 2011-08-26 ENCOUNTER — Other Ambulatory Visit: Payer: Self-pay | Admitting: Oncology

## 2011-08-26 ENCOUNTER — Other Ambulatory Visit (HOSPITAL_BASED_OUTPATIENT_CLINIC_OR_DEPARTMENT_OTHER): Payer: BC Managed Care – PPO | Admitting: Lab

## 2011-08-26 DIAGNOSIS — C549 Malignant neoplasm of corpus uteri, unspecified: Secondary | ICD-10-CM

## 2011-08-26 LAB — CBC WITH DIFFERENTIAL/PLATELET
BASO%: 0.3 % (ref 0.0–2.0)
Basophils Absolute: 0 10*3/uL (ref 0.0–0.1)
EOS%: 1.6 % (ref 0.0–7.0)
Eosinophils Absolute: 0.1 10*3/uL (ref 0.0–0.5)
HCT: 32.5 % — ABNORMAL LOW (ref 34.8–46.6)
HGB: 10.9 g/dL — ABNORMAL LOW (ref 11.6–15.9)
LYMPH%: 27.8 % (ref 14.0–49.7)
MCH: 35.9 pg — ABNORMAL HIGH (ref 25.1–34.0)
MCHC: 33.4 g/dL (ref 31.5–36.0)
MCV: 107.4 fL — ABNORMAL HIGH (ref 79.5–101.0)
MONO#: 0.7 10*3/uL (ref 0.1–0.9)
MONO%: 14 % (ref 0.0–14.0)
NEUT#: 2.7 10*3/uL (ref 1.5–6.5)
NEUT%: 56.3 % (ref 38.4–76.8)
Platelets: 90 10*3/uL — ABNORMAL LOW (ref 145–400)
RBC: 3.03 10*6/uL — ABNORMAL LOW (ref 3.70–5.45)
RDW: 17.7 % — ABNORMAL HIGH (ref 11.2–14.5)
WBC: 4.9 10*3/uL (ref 3.9–10.3)
lymph#: 1.4 10*3/uL (ref 0.9–3.3)
nRBC: 0 % (ref 0–0)

## 2011-08-26 MED ORDER — DEXAMETHASONE 4 MG PO TABS: ORAL_TABLET | ORAL | Status: DC

## 2011-08-26 NOTE — Progress Notes (Signed)
Patient here for recheck counts, cycle 5 taxol/carbo delayed from last week with plt 87k then. No bleeding, some irritation at site of blood draw left forearm from last week which is improving without intervention and she will try warm soaks. CBC today has WBC 4.9, ANC 2.7, Hgb 10.9 and plt 90K. She leaves for 2 wks on ~ 08-01-11. Decided to hold [place] but will give taxol at 135 mg/m2 as planned 4-16 with neulasta after. Patient has had slight clear rhinorrhea without fever, sore throat, lower resp sx and does not feel badly; has been walking outdoors daily, likely some environmental allergies. OK to treat with minimal  allergy symptoms. Changes made to chemo orders.

## 2011-08-27 ENCOUNTER — Ambulatory Visit (HOSPITAL_BASED_OUTPATIENT_CLINIC_OR_DEPARTMENT_OTHER): Payer: BC Managed Care – PPO

## 2011-08-27 ENCOUNTER — Ambulatory Visit: Payer: BC Managed Care – PPO

## 2011-08-27 VITALS — BP 145/75 | HR 66 | Temp 98.3°F

## 2011-08-27 DIAGNOSIS — Z5111 Encounter for antineoplastic chemotherapy: Secondary | ICD-10-CM

## 2011-08-27 DIAGNOSIS — C541 Malignant neoplasm of endometrium: Secondary | ICD-10-CM

## 2011-08-27 DIAGNOSIS — C549 Malignant neoplasm of corpus uteri, unspecified: Secondary | ICD-10-CM

## 2011-08-27 MED ORDER — SODIUM CHLORIDE 0.9 % IJ SOLN
10.0000 mL | INTRAMUSCULAR | Status: DC | PRN
  Filled 2011-08-27: qty 10

## 2011-08-27 MED ORDER — PACLITAXEL CHEMO INJECTION 300 MG/50ML
135.0000 mg/m2 | Freq: Once | INTRAVENOUS | Status: AC
  Administered 2011-08-27: 198 mg via INTRAVENOUS
  Filled 2011-08-27: qty 33

## 2011-08-27 MED ORDER — DIPHENHYDRAMINE HCL 50 MG/ML IJ SOLN: 25.0000 mg | Freq: Once | INTRAMUSCULAR | Status: DC

## 2011-08-27 MED ORDER — ONDANSETRON 16 MG/50ML IVPB (CHCC)
16.0000 mg | Freq: Once | INTRAVENOUS | Status: AC
  Administered 2011-08-27 (×2): 16 mg via INTRAVENOUS

## 2011-08-27 MED ORDER — DEXAMETHASONE SODIUM PHOSPHATE 4 MG/ML IJ SOLN
20.0000 mg | Freq: Once | INTRAMUSCULAR | Status: AC
  Administered 2011-08-27: 20 mg via INTRAVENOUS

## 2011-08-27 MED ORDER — FAMOTIDINE IN NACL 20-0.9 MG/50ML-% IV SOLN
20.0000 mg | Freq: Once | INTRAVENOUS | Status: AC
  Administered 2011-08-27: 20 mg via INTRAVENOUS

## 2011-08-27 MED ORDER — DIPHENHYDRAMINE HCL 25 MG PO CAPS
25.0000 mg | ORAL_CAPSULE | Freq: Four times a day (QID) | ORAL | Status: DC | PRN
  Administered 2011-08-27: 25 mg via ORAL

## 2011-08-27 MED ORDER — SODIUM CHLORIDE 0.9 % IV SOLN
Freq: Once | INTRAVENOUS | Status: AC
  Administered 2011-08-27: 50 mL via INTRAVENOUS

## 2011-08-27 NOTE — Progress Notes (Signed)
Plts 90 done yesterday. Per Dr. Ferol Luz note, okay to treat with taxol but hold Carbo today. Pt. Aware.  HL  Verified with Dr. Cleophas Dunker, pt. Is to return on 4/19 for neulasta due to increase bone discomfort from taxol and pt. Will be leaving for a beach trip also.  Instructed pt. To apply longer pressure over IV site to avoid hematoma due to low plts.  AVS reviewed.  HL

## 2011-08-27 NOTE — Patient Instructions (Signed)
[facility] Discharge Instructions for Patients Receiving Chemotherapy  Today you received the following chemotherapy agents Taxol To help prevent nausea and vomiting after your treatment, we encourage you to take your nausea medication   If you develop nausea and vomiting that is not controlled by your nausea medication, call the clinic. If it is after clinic hours your family physician or the after hours number for the clinic or go to the Emergency Department.   BELOW ARE SYMPTOMS THAT SHOULD BE REPORTED IMMEDIATELY:  *FEVER GREATER THAN 100.5 F  *CHILLS WITH OR WITHOUT FEVER  NAUSEA AND VOMITING THAT IS NOT CONTROLLED WITH YOUR NAUSEA MEDICATION  *UNUSUAL SHORTNESS OF BREATH  *UNUSUAL BRUISING OR BLEEDING  TENDERNESS IN MOUTH AND THROAT WITH OR WITHOUT PRESENCE OF ULCERS  *URINARY PROBLEMS  *BOWEL PROBLEMS  UNUSUAL RASH Items with * indicate a potential emergency and should be followed up as soon as possible.   Feel free to call the clinic you have any questions or concerns. The clinic phone number is [phone].   I have been informed and understand all the instructions given to me. I know to contact the clinic, my physician, or go to the Emergency Department if any problems should occur. I do not have any questions at this time, but understand that I may call the clinic during office hours   should I have any questions or need assistance in obtaining follow up care.    __________________________________________  _____________  __________ Signature of Patient or Authorized Representative            Date                   Time    __________________________________________ Nurse's Signature

## 2011-08-30 ENCOUNTER — Ambulatory Visit (HOSPITAL_BASED_OUTPATIENT_CLINIC_OR_DEPARTMENT_OTHER): Payer: BC Managed Care – PPO

## 2011-08-30 VITALS — BP 130/79 | HR 74 | Temp 99.5°F

## 2011-08-30 DIAGNOSIS — C541 Malignant neoplasm of endometrium: Secondary | ICD-10-CM

## 2011-08-30 DIAGNOSIS — Z5189 Encounter for other specified aftercare: Secondary | ICD-10-CM

## 2011-08-30 DIAGNOSIS — C549 Malignant neoplasm of corpus uteri, unspecified: Secondary | ICD-10-CM

## 2011-08-30 MED ORDER — PEGFILGRASTIM INJECTION 6 MG/0.6ML
6.0000 mg | Freq: Once | SUBCUTANEOUS | Status: AC
  Administered 2011-08-30: 6 mg via SUBCUTANEOUS
  Filled 2011-08-30: qty 0.6

## 2011-09-11 ENCOUNTER — Ambulatory Visit: Payer: BC Managed Care – PPO

## 2011-09-14 ENCOUNTER — Other Ambulatory Visit: Payer: Self-pay | Admitting: Oncology

## 2011-09-15 ENCOUNTER — Other Ambulatory Visit: Payer: Self-pay | Admitting: Oncology

## 2011-09-16 ENCOUNTER — Encounter: Payer: Self-pay | Admitting: Oncology

## 2011-09-16 ENCOUNTER — Ambulatory Visit (HOSPITAL_BASED_OUTPATIENT_CLINIC_OR_DEPARTMENT_OTHER): Payer: BC Managed Care – PPO | Admitting: Oncology

## 2011-09-16 ENCOUNTER — Telehealth: Payer: Self-pay | Admitting: Oncology

## 2011-09-16 ENCOUNTER — Other Ambulatory Visit (HOSPITAL_BASED_OUTPATIENT_CLINIC_OR_DEPARTMENT_OTHER): Payer: BC Managed Care – PPO | Admitting: Lab

## 2011-09-16 VITALS — BP 158/80 | HR 63 | Temp 98.6°F | Ht 59.0 in | Wt 108.9 lb

## 2011-09-16 DIAGNOSIS — Z170 Estrogen receptor positive status [ER+]: Secondary | ICD-10-CM

## 2011-09-16 DIAGNOSIS — C541 Malignant neoplasm of endometrium: Secondary | ICD-10-CM

## 2011-09-16 DIAGNOSIS — C549 Malignant neoplasm of corpus uteri, unspecified: Secondary | ICD-10-CM

## 2011-09-16 LAB — COMPREHENSIVE METABOLIC PANEL
ALT: 17 U/L (ref 0–35)
AST: 22 U/L (ref 0–37)
Albumin: 4.1 g/dL (ref 3.5–5.2)
Alkaline Phosphatase: 75 U/L (ref 39–117)
BUN: 19 mg/dL (ref 6–23)
CO2: 28 mEq/L (ref 19–32)
Calcium: 9.1 mg/dL (ref 8.4–10.5)
Chloride: 104 mEq/L (ref 96–112)
Creatinine, Ser: 0.66 mg/dL (ref 0.50–1.10)
Glucose, Bld: 98 mg/dL (ref 70–99)
Potassium: 4 mEq/L (ref 3.5–5.3)
Sodium: 141 mEq/L (ref 135–145)
Total Bilirubin: 0.6 mg/dL (ref 0.3–1.2)
Total Protein: 6.4 g/dL (ref 6.0–8.3)

## 2011-09-16 LAB — CBC WITH DIFFERENTIAL/PLATELET
BASO%: 0.5 % (ref 0.0–2.0)
Basophils Absolute: 0 10*3/uL (ref 0.0–0.1)
EOS%: 0.7 % (ref 0.0–7.0)
Eosinophils Absolute: 0.1 10*3/uL (ref 0.0–0.5)
HCT: 31 % — ABNORMAL LOW (ref 34.8–46.6)
HGB: 10.3 g/dL — ABNORMAL LOW (ref 11.6–15.9)
LYMPH%: 17.7 % (ref 14.0–49.7)
MCH: 35.7 pg — ABNORMAL HIGH (ref 25.1–34.0)
MCHC: 33.3 g/dL (ref 31.5–36.0)
MCV: 107.5 fL — ABNORMAL HIGH (ref 79.5–101.0)
MONO#: 1.1 10*3/uL — ABNORMAL HIGH (ref 0.1–0.9)
MONO%: 12.4 % (ref 0.0–14.0)
NEUT#: 6.2 10*3/uL (ref 1.5–6.5)
NEUT%: 68.7 % (ref 38.4–76.8)
Platelets: 205 10*3/uL (ref 145–400)
RBC: 2.89 10*6/uL — ABNORMAL LOW (ref 3.70–5.45)
RDW: 15.6 % — ABNORMAL HIGH (ref 11.2–14.5)
WBC: 9 10*3/uL (ref 3.9–10.3)
lymph#: 1.6 10*3/uL (ref 0.9–3.3)
nRBC: 0 % (ref 0–0)

## 2011-09-16 NOTE — Patient Instructions (Signed)
Dr Duard Brady   October 16 798  CT ~ 1-2 days before Dr.Gehrig's appointment.  We will recheck CBC and Bmet chemistries ~ 2 weeks after this chemotherapy.

## 2011-09-16 NOTE — Telephone Encounter (Signed)
Gave pt appt for May lab, chemo and CT. Gave pt oral contrast NPO 4hrs before CT. Pt will see Dr Duard Brady in June. Dr. Darrold Span will schedule to see pt later.

## 2011-09-16 NOTE — Progress Notes (Signed)
OFFICE PROGRESS NOTE Date of Visit 09-16-2011 Physicians: P.Gehrig, T.Brackbill, V.Leschber, S.Ganem  INTERVAL HISTORY:  Patient is seen, alone for visit, in continuing attention to her recurrent endometrial carcinoma, post cycle 5 chemotherapy on 08-27-11. Cycle 5 was delayed a week due to counts, then taxol only given on 4-16 due to platelets of only 90K that day; she did have neulasta on 08-30-2011 (then enjoyed 2 weeks at beach).  History is of microinvasive T1N0 right breast cancer which was ER/PR positive and HER-2 2+ with insufficient material for FISH at diagnosis in 03/1999,  treated with lumpectomy with 2 sentinel node evaluation, local radiation and 5 years of Tamoxifen thru 05/2004, then aromatase inhibitor 07/2004 thru 04/2006. 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 She had 6 cycles of taxol/carboplatin through Jan 2010, and vaginal cuff brachytherapy. She did well until some vague LUQ symptoms in Nov. 2012, with CT AP in [facility] Mar 22, 2011 showing left pelvic sidewall involvement. She was taken to laparoscopic evaluation at [facility] by Dr.Gehrig 04-25-2011, with finding of dense fibrosis in the area as well as mass encasing obturator nerve and vein as well as internal iliac vasculature; left ureterolysis was performed. No pathology was submitted from that procedure. We proceeded with initial 3 additional cycles of taxol/ carboplatin (with carbo skin tests) from 05-16-2011 thru 06-28-2011, then repeated CT AP on 07-17-2011. The CT showed improvement in the left pelvic sidewall mass now 1.9 x 2 cm and 3.4 cm cephalocaudad, as compared with 2.5 x 2.2 cm and 5.4 cm on CT 03-22-11, and no progressive disease elsewhere. She saw Dr.Gehrig after the CT, with recommendation for an additonal 3 cycles of same chemotherapy then repeat scan. Cycle 4 was taxol + [place], with  carbo skin test and neulasta, then cycle 5 as above only taxol.  Patient has felt well overall since last treatment. She is no longer having any LUQ abdominal discomfort, and no other abdominal or pelvic pain. Appetite is not as good as prior to chemotherapy, but still reasonable. She does not have any significant peripheral neuropathy, was able to walk on the beach with no difficulty. She has had no fever or symptoms of infection, no shortness of breath or cough, no bleeding, no LE swelling. Bowels have been moving regularly, no bladder symptoms. Remainder of 10 point Review of Systems negative.  Objective:  Vital signs in last 24 hours:  BP 158/80  Pulse 63  Temp(Src) 98.6 F (37 C) (Oral)  Ht 4\' 11"  (1.499 m)  Wt 108 lb 14.4 oz (49.397 kg)  BMI 22.00 kg/m2 Easily mobile, looks comfortable. BP at home this AM reportedly 116 systolic. HEENT:mucous membranes moist, pharynx normal without lesions. Complete alopecia. PERRL. LymphaticsCervical, supraclavicular, and axillary nodes normal.No inguinal adenopathy appreciated. Resp: clear to auscultation bilaterally and normal percussion bilaterally Cardio: regular rate and rhythm GI: soft, non-tender; bowel sounds normal; no masses,  no organomegaly No appreciable inguinal adenopathy Extremities: extremities normal, atraumatic, no cyanosis or edema Neuro:no sensory deficits noted Right breast with well-healed lumpectomy scar, no dominant mass and no skin or nipple findings of concern, nothing right axilla and no swelling RUE. Left breast and left axilla/ LUE likewise unremarkable No central catheter  Lab Results:   Basename 09/16/11 1219  WBC 9.0  HGB 10.3*  HCT 31.0*  PLT 205   ANC 6.2 BMET  Basename 09/16/11 1219  NA  141  K 4.0  CL 104  CO2 28  GLUCOSE 98  BUN 19  CREATININE 0.66  CALCIUM 9.1  full CMET resulted after visit WNL  Studies/Results: CT AP 07-17-11 as noted above.  Last bilateral mammograms were at [facility] 09-12-2010. Medications: I have reviewed the patient's current medications, no changes. She uses full decadron premedication for taxol.  Assessment/Plan: 1. Recurrent endometrial carcinoma, history as above: counts are good to proceed with cycle 6 taxol + carboplatin on 09-17-11, with neulasta planned 09-24-11. We will recheck CBC ~ 2 wks after this treatment due to previous cytopenias, with BMET. She will have CT AP shortly before follow up visit with Dr.Gehrig on 10-16-11. She has an old appointment back to me on 11-18-11, which may or may not be correct depending on her reevaluation with gyn oncology.  2. History of T1N0 right breast cancer post treatment as above, which included tamoxifen. Not known recurrent. She is scheduled for mammograms 10-02-11 per EMR. 3.hx tachycardia and some HTN, known to Dr.Brackbill. Has tolerated chemo better off norvasc, is still on atenolol.  Patient was comfortable with discussion and plan as above.   Reece Packer, MD   09/16/2011, 9:19 PM

## 2011-09-17 ENCOUNTER — Ambulatory Visit: Payer: BC Managed Care – PPO

## 2011-09-17 ENCOUNTER — Other Ambulatory Visit: Payer: Self-pay | Admitting: Oncology

## 2011-09-17 ENCOUNTER — Ambulatory Visit (HOSPITAL_BASED_OUTPATIENT_CLINIC_OR_DEPARTMENT_OTHER): Payer: BC Managed Care – PPO

## 2011-09-17 VITALS — BP 151/79 | HR 69 | Temp 98.1°F

## 2011-09-17 DIAGNOSIS — Z5111 Encounter for antineoplastic chemotherapy: Secondary | ICD-10-CM

## 2011-09-17 DIAGNOSIS — C549 Malignant neoplasm of corpus uteri, unspecified: Secondary | ICD-10-CM

## 2011-09-17 DIAGNOSIS — C541 Malignant neoplasm of endometrium: Secondary | ICD-10-CM

## 2011-09-17 MED ORDER — SODIUM CHLORIDE 0.9 % IV SOLN
Freq: Once | INTRAVENOUS | Status: AC
  Administered 2011-09-17: 10:00:00 via INTRAVENOUS

## 2011-09-17 MED ORDER — SODIUM CHLORIDE 0.9 % IJ SOLN
100.0000 ug | Freq: Once | INTRAVENOUS | Status: AC
  Administered 2011-09-17: 0.1 mg via INTRADERMAL
  Filled 2011-09-17: qty 0.01

## 2011-09-17 MED ORDER — DEXAMETHASONE SODIUM PHOSPHATE 4 MG/ML IJ SOLN
20.0000 mg | Freq: Once | INTRAMUSCULAR | Status: AC
  Administered 2011-09-17: 20 mg via INTRAVENOUS

## 2011-09-17 MED ORDER — DEXTROSE 5 % IV SOLN
135.0000 mg/m2 | Freq: Once | INTRAVENOUS | Status: AC
  Administered 2011-09-17: 198 mg via INTRAVENOUS
  Filled 2011-09-17: qty 33

## 2011-09-17 MED ORDER — ONDANSETRON 16 MG/50ML IVPB (CHCC)
16.0000 mg | Freq: Once | INTRAVENOUS | Status: AC
  Administered 2011-09-17: 16 mg via INTRAVENOUS

## 2011-09-17 MED ORDER — SODIUM CHLORIDE 0.9 % IV SOLN
342.0000 mg | Freq: Once | INTRAVENOUS | Status: AC
  Administered 2011-09-17: 340 mg via INTRAVENOUS
  Filled 2011-09-17: qty 34

## 2011-09-17 MED ORDER — DIPHENHYDRAMINE HCL 25 MG PO TABS
25.0000 mg | ORAL_TABLET | Freq: Once | ORAL | Status: AC
  Administered 2011-09-17: 25 mg via ORAL
  Filled 2011-09-17: qty 1

## 2011-09-17 MED ORDER — FAMOTIDINE IN NACL 20-0.9 MG/50ML-% IV SOLN
20.0000 mg | Freq: Once | INTRAVENOUS | Status: AC
  Administered 2011-09-17: 20 mg via INTRAVENOUS

## 2011-09-17 NOTE — Patient Instructions (Signed)
[facility] Discharge Instructions for Patients Receiving Chemotherapy  Today you received the following chemotherapy agents;  Taxol and Carboplatin.   To help prevent nausea and vomiting after your treatment, we encourage you to take your nausea medication, Zofran (ondansetron) and Ativan (lorazepam) as needed for any nausea and/or vomiting.    If you develop nausea and vomiting that is not controlled by your nausea medication, call the clinic. If it is after clinic hours your family physician or the after hours number for the clinic or go to the Emergency Department.   BELOW ARE SYMPTOMS THAT SHOULD BE REPORTED IMMEDIATELY:  *FEVER GREATER THAN 100.5 F  *CHILLS WITH OR WITHOUT FEVER  NAUSEA AND VOMITING THAT IS NOT CONTROLLED WITH YOUR NAUSEA MEDICATION  *UNUSUAL SHORTNESS OF BREATH  *UNUSUAL BRUISING OR BLEEDING  TENDERNESS IN MOUTH AND THROAT WITH OR WITHOUT PRESENCE OF ULCERS  *URINARY PROBLEMS  *BOWEL PROBLEMS  UNUSUAL RASH Items with * indicate a potential emergency and should be followed up as soon as possible.  Feel free to call the clinic you have any questions or concerns. The clinic phone number is [phone].   I have been informed and understand all the instructions given to me. I know to contact the clinic, my physician, or go to the Emergency Department if any problems should occur. I do not have any questions at this time, but understand that I may call the clinic during office hours   should I have any questions or need assistance in obtaining follow up care.    __________________________________________  _____________  __________ Signature of Patient or Authorized Representative            Date                   Time    __________________________________________ Nurse's Signature

## 2011-09-18 ENCOUNTER — Ambulatory Visit: Payer: BC Managed Care – PPO | Admitting: Gynecologic Oncology

## 2011-09-24 ENCOUNTER — Ambulatory Visit (HOSPITAL_BASED_OUTPATIENT_CLINIC_OR_DEPARTMENT_OTHER): Payer: BC Managed Care – PPO

## 2011-09-24 VITALS — BP 133/74 | HR 67 | Temp 98.9°F

## 2011-09-24 DIAGNOSIS — C549 Malignant neoplasm of corpus uteri, unspecified: Secondary | ICD-10-CM

## 2011-09-24 DIAGNOSIS — Z5189 Encounter for other specified aftercare: Secondary | ICD-10-CM

## 2011-09-24 DIAGNOSIS — C541 Malignant neoplasm of endometrium: Secondary | ICD-10-CM

## 2011-09-24 MED ORDER — PEGFILGRASTIM INJECTION 6 MG/0.6ML
6.0000 mg | Freq: Once | SUBCUTANEOUS | Status: AC
  Administered 2011-09-24: 6 mg via SUBCUTANEOUS
  Filled 2011-09-24: qty 0.6

## 2011-09-30 ENCOUNTER — Other Ambulatory Visit: Payer: BC Managed Care – PPO | Admitting: Lab

## 2011-10-01 ENCOUNTER — Telehealth: Payer: Self-pay

## 2011-10-01 ENCOUNTER — Other Ambulatory Visit (HOSPITAL_BASED_OUTPATIENT_CLINIC_OR_DEPARTMENT_OTHER): Payer: BC Managed Care – PPO | Admitting: Lab

## 2011-10-01 DIAGNOSIS — C549 Malignant neoplasm of corpus uteri, unspecified: Secondary | ICD-10-CM

## 2011-10-01 DIAGNOSIS — C541 Malignant neoplasm of endometrium: Secondary | ICD-10-CM

## 2011-10-01 LAB — CBC WITH DIFFERENTIAL/PLATELET
BASO%: 0.4 % (ref 0.0–2.0)
Basophils Absolute: 0.1 10*3/uL (ref 0.0–0.1)
EOS%: 0.3 % (ref 0.0–7.0)
Eosinophils Absolute: 0.1 10*3/uL (ref 0.0–0.5)
HCT: 30.4 % — ABNORMAL LOW (ref 34.8–46.6)
HGB: 10 g/dL — ABNORMAL LOW (ref 11.6–15.9)
LYMPH%: 6.3 % — ABNORMAL LOW (ref 14.0–49.7)
MCH: 35.3 pg — ABNORMAL HIGH (ref 25.1–34.0)
MCHC: 32.8 g/dL (ref 31.5–36.0)
MCV: 107.7 fL — ABNORMAL HIGH (ref 79.5–101.0)
MONO#: 1.9 10*3/uL — ABNORMAL HIGH (ref 0.1–0.9)
MONO%: 6.4 % (ref 0.0–14.0)
NEUT#: 24.9 10*3/uL — ABNORMAL HIGH (ref 1.5–6.5)
NEUT%: 86.6 % — ABNORMAL HIGH (ref 38.4–76.8)
Platelets: 118 10*3/uL — ABNORMAL LOW (ref 145–400)
RBC: 2.83 10*6/uL — ABNORMAL LOW (ref 3.70–5.45)
RDW: 14.7 % — ABNORMAL HIGH (ref 11.2–14.5)
WBC: 28.8 10*3/uL — ABNORMAL HIGH (ref 3.9–10.3)
lymph#: 1.8 10*3/uL (ref 0.9–3.3)
nRBC: 0 % (ref 0–0)

## 2011-10-01 LAB — BASIC METABOLIC PANEL
BUN: 14 mg/dL (ref 6–23)
CO2: 28 mEq/L (ref 19–32)
Calcium: 9 mg/dL (ref 8.4–10.5)
Chloride: 107 mEq/L (ref 96–112)
Creatinine, Ser: 0.72 mg/dL (ref 0.50–1.10)
Glucose, Bld: 92 mg/dL (ref 70–99)
Potassium: 3.7 mEq/L (ref 3.5–5.3)
Sodium: 144 mEq/L (ref 135–145)

## 2011-10-01 NOTE — Telephone Encounter (Signed)
Called pt to inform her per Dr. Darrold Span, her WBC were great post neulasta, HGB stable at 10, and plt 118, which is ok, should be ok for mammogram tomorrow also.  Pt verbalizes understanding.

## 2011-10-02 ENCOUNTER — Ambulatory Visit
Admission: RE | Admit: 2011-10-02 | Discharge: 2011-10-02 | Disposition: A | Discharge status: Home / Self Care | Payer: BC Managed Care – PPO | Source: Ambulatory Visit | Attending: Oncology | Admitting: Oncology

## 2011-10-02 ENCOUNTER — Telehealth: Payer: Self-pay

## 2011-10-02 DIAGNOSIS — Z1231 Encounter for screening mammogram for malignant neoplasm of breast: Secondary | ICD-10-CM

## 2011-10-02 NOTE — Telephone Encounter (Signed)
Received message from pt stating that she finished chemo 2 weeks ago, and she wants to know can she get her teeth cleaned now.  States she remembered she was not to do that while on chemo.  Note to MD's desk for review.

## 2011-10-08 ENCOUNTER — Telehealth: Payer: Self-pay

## 2011-10-08 NOTE — Telephone Encounter (Signed)
Left message for [name] that Dr. Darrold Span said that it would be fine for her to schedule a teeth cleaning two weeks from 10-02-11 or later.  If any questions, she may call the office.

## 2011-10-11 ENCOUNTER — Ambulatory Visit (HOSPITAL_COMMUNITY)
Admission: RE | Admit: 2011-10-11 | Discharge: 2011-10-11 | Disposition: A | Discharge status: Home / Self Care | Payer: BC Managed Care – PPO | Source: Ambulatory Visit | Attending: Oncology | Admitting: Oncology

## 2011-10-11 DIAGNOSIS — N1330 Unspecified hydronephrosis: Secondary | ICD-10-CM | POA: Insufficient documentation

## 2011-10-11 DIAGNOSIS — C549 Malignant neoplasm of corpus uteri, unspecified: Secondary | ICD-10-CM | POA: Insufficient documentation

## 2011-10-11 DIAGNOSIS — C541 Malignant neoplasm of endometrium: Secondary | ICD-10-CM

## 2011-10-11 DIAGNOSIS — Z923 Personal history of irradiation: Secondary | ICD-10-CM | POA: Insufficient documentation

## 2011-10-11 MED ORDER — IOHEXOL 300 MG/ML  SOLN
100.0000 mL | Freq: Once | INTRAMUSCULAR | Status: AC | PRN
  Administered 2011-10-11: 100 mL via INTRAVENOUS

## 2011-10-16 ENCOUNTER — Ambulatory Visit
Admission: RE | Admit: 2011-10-16 | Discharge: 2011-10-16 | Disposition: A | Discharge status: Home / Self Care | Payer: Medicare Other | Source: Ambulatory Visit | Attending: Radiation Oncology | Admitting: Radiation Oncology

## 2011-10-16 ENCOUNTER — Ambulatory Visit: Payer: BC Managed Care – PPO | Attending: Gynecologic Oncology | Admitting: Gynecologic Oncology

## 2011-10-16 ENCOUNTER — Encounter: Payer: Self-pay | Admitting: Radiation Oncology

## 2011-10-16 ENCOUNTER — Encounter: Payer: Self-pay | Admitting: Gynecologic Oncology

## 2011-10-16 VITALS — BP 140/78 | HR 69 | Temp 97.3°F | Resp 18 | Ht 59.0 in | Wt 107.6 lb

## 2011-10-16 VITALS — BP 140/78 | HR 70 | Temp 97.8°F | Resp 18 | Ht 59.0 in | Wt 107.0 lb

## 2011-10-16 DIAGNOSIS — I10 Essential (primary) hypertension: Secondary | ICD-10-CM | POA: Insufficient documentation

## 2011-10-16 DIAGNOSIS — Z7982 Long term (current) use of aspirin: Secondary | ICD-10-CM | POA: Insufficient documentation

## 2011-10-16 DIAGNOSIS — C55 Malignant neoplasm of uterus, part unspecified: Secondary | ICD-10-CM | POA: Insufficient documentation

## 2011-10-16 DIAGNOSIS — Z90710 Acquired absence of both cervix and uterus: Secondary | ICD-10-CM | POA: Insufficient documentation

## 2011-10-16 DIAGNOSIS — Z853 Personal history of malignant neoplasm of breast: Secondary | ICD-10-CM | POA: Insufficient documentation

## 2011-10-16 DIAGNOSIS — C541 Malignant neoplasm of endometrium: Secondary | ICD-10-CM

## 2011-10-16 DIAGNOSIS — Z87891 Personal history of nicotine dependence: Secondary | ICD-10-CM | POA: Insufficient documentation

## 2011-10-16 DIAGNOSIS — E785 Hyperlipidemia, unspecified: Secondary | ICD-10-CM | POA: Insufficient documentation

## 2011-10-16 DIAGNOSIS — Z79899 Other long term (current) drug therapy: Secondary | ICD-10-CM | POA: Insufficient documentation

## 2011-10-16 DIAGNOSIS — C549 Malignant neoplasm of corpus uteri, unspecified: Secondary | ICD-10-CM | POA: Insufficient documentation

## 2011-10-16 DIAGNOSIS — Z9889 Other specified postprocedural states: Secondary | ICD-10-CM | POA: Insufficient documentation

## 2011-10-16 DIAGNOSIS — R002 Palpitations: Secondary | ICD-10-CM | POA: Insufficient documentation

## 2011-10-16 DIAGNOSIS — Z510 Encounter for antineoplastic radiation therapy: Secondary | ICD-10-CM | POA: Insufficient documentation

## 2011-10-16 DIAGNOSIS — Z8249 Family history of ischemic heart disease and other diseases of the circulatory system: Secondary | ICD-10-CM | POA: Insufficient documentation

## 2011-10-16 DIAGNOSIS — Z9221 Personal history of antineoplastic chemotherapy: Secondary | ICD-10-CM | POA: Insufficient documentation

## 2011-10-16 NOTE — Progress Notes (Signed)
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 Patient was diagnosed with stage IB papillary serous carcinoma of the uterus back in 2009. Patient underwent surgery followed by intracavitary brachytherapy treatments. Patient also received adjuvant chemotherapy at that time. She did well until late last year when CT scan showed a recurrence along the left obturator chain nodal region. She  was taken to [facility] for attempted surgical bulking. Intraoperatively by Robotic  evaluation the nodal mass was encasing the obturator nerve and vein as well as internal iliac vasculature and left ureter. The patient underwent a left ureteral lysis. Attempted surgical resection was aborted.   Patient then proceeded with 3 cycles of Taxol and carboplatinum. CT scan after initial 3 cycles showed tumor shrinkage. She was then treated with additional 3 cycles with carboplatinum being held for one of the cycles.  Patient's scan unfortunately showed tumor enlargement in the 2 left pelvic nodal areas. There was no new areas in the pelvis or elsewhere on her repeat scans. Given the isolated area of involvement radiation therapy been consulted for consideration for additional treatment.                             ALLERGIES:  is allergic to ace inhibitors; codeine; and demerol.  Meds: Current Outpatient Prescriptions  Medication Sig Dispense Refill  . ALPHA-LIPOIC ACID 100 MG TABS Take 100 mg by mouth daily.        Marland Kitchen ALPRAZolam (XANAX) 0.25 MG tablet Take 0.25 mg  by mouth as needed.       Marland Kitchen aspirin 81 MG tablet Take 81 mg by mouth daily.        Marland Kitchen atenolol (TENORMIN) 25 MG tablet Take 25 mg by mouth 2 (two) times daily. 1/2 Tab twice daily      . Calcium Carbonate-Vitamin D (CALCIUM 600+D PO) Take 1 tablet by mouth 3 (three) times a week.        . cholecalciferol (VITAMIN D) 1000 UNITS tablet Take 1,000 Units by mouth daily.        . Coenzyme Q10 (CO Q10) 100 MG TABS Take 100 mg elemental calcium/kg/hr by mouth daily.        Marland Kitchen dexamethasone (DECADRON) 4 MG tablet TAKE 5 TABS = 20 MG 12 HRS. AND 6 HRS WITH FOOD PRIOR TO TAXOL CHEMOTHERAPY.  20 tablet  0  . fish oil-omega-3 fatty acids 1000 MG capsule Take 1 g by mouth daily.        Marland Kitchen LORazepam (ATIVAN) 0.5 MG tablet TAKE 1 TABLET ORALLY OR UNDER THE TONGUE EVERY 4 HRS. AS NEEDED FOR NAUSEA  20 tablet  0  . Magnesium 250 MG TABS Take 250 mg by mouth 3 (three) times a week.        . ondansetron (ZOFRAN) 8 MG tablet TAKE 1 OR 2 TABLETS BY MOUTH EVERY 12 HOURS AS NEEDED NAUSEA  30 tablet  1  . simvastatin (ZOCOR) 20 MG tablet Take 1 tablet (20 mg total) by mouth at  bedtime.  90 tablet  3    Physical Findings: The patient is in no acute distress. Patient is alert and oriented.  height is 4\' 11"  (1.499 m) and weight is 107 lb 9.6 oz (48.807 kg). Her oral temperature is 97.3 F (36.3 C). Her blood pressure is 140/78 and her pulse is 69. Her respiration is 18. Marland Kitchen  No palpable cervical supraclavicular or axillary adenopathy to lungs are clear to auscultation. The heart has regular rhythm and rate. The abdomen is soft and nontender with normal bowel sounds. There is no inguinal adenopathy appreciated. On pelvic examination the external genitalia are unremarkable. A speculum exam was performed which shows the vaginal cuff to be well healed. There are  mild radiation changes noted  in the proximal vagina. On bimanual examination there is a palpable mass measuring approximately 3 cm along the left upper pelvic sidewall  area.  Lab Findings: Lab Results  Component Value Date   WBC 28.8* 10/01/2011   HGB 10.0* 10/01/2011   HCT 30.4* 10/01/2011   MCV 107.7* 10/01/2011   PLT 118* 10/01/2011    @LASTCHEM @  Radiographic Findings: Ct Abdomen Pelvis W Contrast  10/11/2011  *RADIOLOGY REPORT*  Clinical Data: Restaging recurrent endometrial carcinoma. Chemotherapy in progress.  Radiation therapy complete.  CT ABDOMEN AND PELVIS WITH CONTRAST  Technique:  Multidetector CT imaging of the abdomen and pelvis was performed following the standard protocol during bolus administration of intravenous contrast.  Contrast: OMNIPAQUE IOHEXOL 300 MG/ML  SOLN  Comparison: CT 07/17/2011  Findings: Lung bases are clear.  No pericardial fluid.  No focal hepatic lesions.  The gallbladder contains multiple dependent gallstones without acute inflammation.  The pancreas, spleen, adrenal glands, and kidneys are normal.  Delayed pyelogram phase imaging demonstrates mild hydro nephrosis and hydroureter on the right which is not significantly changed from prior.  Stomach is normal.  Hiatal hernia is present.  Small bowel and colon are normal.  Abdominal aorta normal caliber.  No retroperitoneal lymphadenopathy.  No peritoneal disease.  In the pelvis, there is interval enlargement in the left pelvic sidewall mass which measures 2.6 x 2.4 cm compared to 2.0 x 1.9 cm on prior.  There is a second smaller sidewall mass more superiorly on the left measuring 2.1 x 1.7 cm (image 51) increased from 1.5 x 1.2 cm.  There is no new nodularity or adenopathy within the pelvis.  Post hysterectomy anatomy.  The bladder wall is slightly thickened along the right aspect which is not changed from prior (image 61).  IMPRESSION:  1.  Interval enlargement of the left pelvic sidewall masses. 2.  No new nodular disease in the pelvis or lymphadenopathy. 3.  No evidence  of distant metastasis.  4.  Mild hydronephrosis on the right is similar to prior. 5.  Focal thickening of  the right aspect the bladder is stable compared to prior.  Original Report Authenticated By: Genevive Bi, M.D.        Impression:  Recurrent papillary serous carcinoma of the endometrium. As above the patient has a isolated recurrence along the left pelvic sidewall area. She would be a good candidate for radiation therapy directed at this area. I discussed the overall treatment course, side effects and potential toxicities of radiation therapy in the situation with [name]. She appears to understand and wishes to proceed the planned course of treatment. Anticipate approximately 5-6 weeks of radiation therapy as part of her management. Given the extensive small bowel in the pelvis area as  well as the fact that she has had previous radiation therapy in the immediate surrounding area, I would recommend  intensity modulated radiation therapy for her management. This type of treatment were reduce the dose to previously irradiated tissues and to lower the dose to the small bowel and left pelvic bony areas.  Plan:  Simulation and planning on 10/17/2011 at 4 PM.  _____________________________________   Billie Lade, PhD, MD

## 2011-10-16 NOTE — Progress Notes (Addendum)
HERE TODAY FOR CONSULT OF RECURRENT ENDOMETRIAL CANCER.  FINISHED CHEMO WITH DR. Darrold Span ON MAY 7TH.  CHEMO GIVEN WAS CARBOPLATIN AND TAXOL.  NO PROBLEMS WITH BLEEDING.  NO ABDOMINAL PAIN OR PROBLEM WITH BOWELS.  HAS HAD TOTAL HYSTERECTOMY

## 2011-10-16 NOTE — Progress Notes (Signed)
Consult Note: Gyn-Onc  [name] 71 y.o. female  CC:  Chief Complaint  Patient presents with  . Cancer    f/u endometrial cancer    HPI: Patient is seen, alone for visit, in continuing attention to her recurrent endometrial carcinoma, post cycle 6 chemotherapy on 5/13. Cycle 5 was delayed a week due to counts, then taxol only given on 4-16 due to platelets of only 90K that day; she did have neulasta on 08-30-2011 (then enjoyed 2 weeks at beach).  History is of microinvasive T1N0 right breast cancer which was ER/PR positive and HER-2 2+ with insufficient material for FISH at diagnosis in 03/1999, treated with lumpectomy with 2 sentinel node evaluation, local radiation and 5 years of Tamoxifen thru 05/2004, then aromatase inhibitor 07/2004 thru 04/2006. 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 She had 6 cycles of taxol/carboplatin through Jan 2010, and vaginal cuff brachytherapy. She did well until some vague LUQ symptoms in Nov. 2012, with CT AP in [facility] system Mar 22, 2011 showing left pelvic sidewall involvement. She was taken to laparoscopic evaluation 04-25-2011, with findings of dense fibrosis in the area as well as mass encasing obturator nerve and vein as well as internal iliac vasculature; left ureterolysis was performed. No pathology was submitted from that procedure. We proceeded with initial 3 additional cycles of taxol/ carboplatin (with carbo skin tests) from 05-16-2011 thru 06-28-2011, then repeated CT AP on 07-17-2011. The CT showed improvement in the left pelvic sidewall mass now 1.9 x 2 cm and 3.4 cm cephalocaudad, as compared with 2.5 x 2.2 cm and 5.4 cm on CT 03-22-11, and no progressive disease elsewhere. She saw Dr.Avyonna Wagoner after the CT, with recommendation for an additonal 3 cycles of same chemotherapy then repeat scan. Cycle 4 was taxol + [place],  with carbo skin test and neulasta, then cycle 5 as above only taxol.   Patient has felt well overall since last treatment. She is no longer having any LUQ abdominal discomfort, and no other abdominal or pelvic pain. Appetite is not as good as prior to chemotherapy, but still reasonable. She does not have any significant peripheral neuropathy, was able to walk on the beach with no difficulty. She has had no fever or symptoms of infection, no shortness of breath or cough, no bleeding, no LE swelling. Bowels have been moving regularly, no bladder symptoms. Remainder of 10 point Review of Systems negative.  Interval History:  CT scan: Findings: Lung bases are clear. No pericardial fluid. No focal hepatic lesions. The gallbladder contains multiple dependent gallstones without acute inflammation. The pancreas, spleen, adrenal glands, and kidneys are normal. Delayed pyelogram phase imaging demonstrates mild hydro nephrosis and hydroureter on the right which is not significantly changed from prior. Stomach is normal. Hiatal hernia is present. Small bowel and colon are normal.  Abdominal aorta normal caliber. No retroperitoneal lymphadenopathy. No peritoneal disease. In the pelvis, there is interval enlargement in the left pelvic sidewall mass which measures 2.6 x 2.4 cm compared to 2.0 x 1.9 cm on prior. There is a second smaller sidewall mass more superiorly on the left measuring 2.1 x 1.7 cm (image 51) increased from 1.5 x 1.2 cm. There is no new nodularity or adenopathy within the pelvis. Post hysterectomy anatomy. The bladder wall is slightly thickened along the right aspect which is not changed from prior. IMPRESSION:  1. Interval enlargement  of the left pelvic sidewall masses.  2. No new nodular disease in the pelvis or lymphadenopathy.  3. No evidence of distant metastasis.  4. Mild hydronephrosis on the right is similar to prior.  5. Focal thickening of the right aspect the bladder is stable compared to  prior.  Review of Systems: Patient has felt well overall since last treatment. She is no longer having any LUQ abdominal discomfort, and no other abdominal or pelvic pain. Appetite is not as good as prior to chemotherapy, but still reasonable and improving everyday as she's been off chemotherapy. She does not have any significant peripheral neuropathy, was able to walk on the beach with no difficulty. She has had no fever or symptoms of infection, no shortness of breath or cough, no bleeding, no LE swelling. She does not notice any neuropathy which is wearing shoes but which she takes his shoes off at night she occasionally notices it. Her weight is down still about 4 pounds but she states that she has been a little bit anxious about this visit today. She has been exercising more. At worst her neuropathy is a grade 1.  Bowels have been moving regularly, no bladder symptoms. Remainder of 10 point Review of Systems negative.  Current Meds:  Outpatient Encounter Prescriptions as of 10/16/2011  Medication Sig Dispense Refill  . ALPHA-LIPOIC ACID 100 MG TABS Take 100 mg by mouth daily.        Marland Kitchen ALPRAZolam (XANAX) 0.25 MG tablet Take 0.25 mg by mouth as needed.       Marland Kitchen aspirin 81 MG tablet Take 81 mg by mouth daily.        Marland Kitchen atenolol (TENORMIN) 25 MG tablet Take 25 mg by mouth 2 (two) times daily. 1/2 Tab twice daily      . Calcium Carbonate-Vitamin D (CALCIUM 600+D PO) Take 1 tablet by mouth 3 (three) times a week.        . cholecalciferol (VITAMIN D) 1000 UNITS tablet Take 1,000 Units by mouth daily.        . Coenzyme Q10 (CO Q10) 100 MG TABS Take 100 mg elemental calcium/kg/hr by mouth daily.        Marland Kitchen dexamethasone (DECADRON) 4 MG tablet TAKE 5 TABS = 20 MG 12 HRS. AND 6 HRS WITH FOOD PRIOR TO TAXOL CHEMOTHERAPY.  20 tablet  0  . fish oil-omega-3 fatty acids 1000 MG capsule Take 1 g by mouth daily.        Marland Kitchen LORazepam (ATIVAN) 0.5 MG tablet TAKE 1 TABLET ORALLY OR UNDER THE TONGUE EVERY 4 HRS. AS NEEDED  FOR NAUSEA  20 tablet  0  . Magnesium 250 MG TABS Take 250 mg by mouth 3 (three) times a week.        . ondansetron (ZOFRAN) 8 MG tablet TAKE 1 OR 2 TABLETS BY MOUTH EVERY 12 HOURS AS NEEDED NAUSEA  30 tablet  1  . simvastatin (ZOCOR) 20 MG tablet Take 1 tablet (20 mg total) by mouth at bedtime.  90 tablet  3    Allergy:  Allergies  Allergen Reactions  . Ace Inhibitors Anaphylaxis  . Codeine Nausea Only  . Demerol Nausea Only    Social Hx:   History   Social History  . Marital Status: Married    Spouse Name: N/A    Number of Children: N/A  . Years of Education: N/A   Occupational History  . Not on file.   Social History Main Topics  . Smoking  status: Former Smoker    Types: Cigarettes    Quit date: 05/13/1962  . Smokeless tobacco: Never Used  . Alcohol Use: 3.0 oz/week    5 Glasses of wine per week  . Drug Use: No  . Sexually Active: No   Other Topics Concern  . Not on file   Social History Narrative  . No narrative on file    Past Surgical Hx:  Past Surgical History  Procedure Date  . Total abdominal hysterectomy w/ bilateral salpingoophorectomy   . Breast lumpectomy   . Appendectomy   . Tonsillectomy   . Foot surgery     RIGHT    Past Medical Hx:  Past Medical History  Diagnosis Date  . Hypertension   . Palpitations   . History of breast cancer 2000    right breast -- treated with lumpectomy and radiation therarpy, postoperatively with tamoxifen   . Endometrial cancer 12/2007    s/p total abdominal hysterectomy at [facility] - ovaries were also removed   . Hyperlipidemia   . Hypercholesterolemia   . PVC's (premature ventricular contractions)     Family Hx:  Family History  Problem Relation Age of Onset  . Thrombosis Father     coronary thrombosis  . Hypertension Father   . Heart failure Mother   . Coronary artery disease Brother     Vitals:  There were no vitals taken for this visit.  Physical Exam:  Well-nourished well-developed  female in no acute distress.  Neck: No lymphadenopathy no thyromegaly.  Lungs: Clear to auscultation bilaterally.  Cardiovascular: Regular rate and rhythm.  Abdomen: Soft, nontender, nondistended. There is no palpable masses or hepatosplenomegaly. There is no incisional hernias.  Groins: No lymphadenopathy.  Extremities: No edema.  Pelvic external genitalia within normal limits. Bimanual examination does reveal palpable mass on the sidewall on the left side measuring approximately 3 cm. Assessment/Plan: 71 year old with recurrent uterine papillary serous carcinoma initially stage IB diagnosed in 2009. Most recent imaging in May of this year reveals interval enlargement of her left pelvic sidewall mass consistent with nodal disease. She did receive Taxol only for 2 cycles so it is unclear if the increase in size was due to lack of a platinum agent or not.  She has no evidence of any extra pelvic disease and I believe that this would be an opportunity for consideration of radiation therapy. In discussing this with the patient and her husband they would be amenable to this. She is scheduled to see Dr. Roselind Messier for consideration of this today at 10:30.    Fahim Kats A., MD 10/16/2011, 8:16 AM

## 2011-10-16 NOTE — Patient Instructions (Signed)
Follow up with Dr. Roselind Messier today at 10:30.

## 2011-10-17 ENCOUNTER — Ambulatory Visit
Admission: RE | Admit: 2011-10-17 | Discharge: 2011-10-17 | Disposition: A | Discharge status: Home / Self Care | Payer: Medicare Other | Source: Ambulatory Visit | Attending: Radiation Oncology | Admitting: Radiation Oncology

## 2011-10-17 DIAGNOSIS — C541 Malignant neoplasm of endometrium: Secondary | ICD-10-CM

## 2011-10-17 DIAGNOSIS — C775 Secondary and unspecified malignant neoplasm of intrapelvic lymph nodes: Secondary | ICD-10-CM

## 2011-10-22 DIAGNOSIS — C775 Secondary and unspecified malignant neoplasm of intrapelvic lymph nodes: Secondary | ICD-10-CM | POA: Insufficient documentation

## 2011-10-22 NOTE — Progress Notes (Signed)
  Radiation Oncology         [phone]) [phone] ________________________________  Name: [name] MRN: [id number]  Date: 10/17/2011  DOB: [birth year]  SIMULATION AND TREATMENT PLANNING NOTE  DIAGNOSIS:  Recurrent papillary serous carcinoma of the uterus  NARRATIVE:  The patient was brought to the CT Simulation planning suite.  Identity was confirmed.  All relevant records and images related to the planned course of therapy were reviewed.  The patient freely provided informed written consent to proceed with treatment after reviewing the details related to the planned course of therapy. The consent form was witnessed and verified by the simulation staff.  Then, the patient was set-up in a stable reproducible  supine position for radiation therapy.  CT images were obtained.  Surface markings were placed.  The CT images were loaded into the planning software.  Then the target and avoidance structures were contoured.  Treatment planning then occurred.  The radiation prescription was entered and confirmed.  A total of 1 complex treatment devices were fabricated. I have requested : Intensity Modulated Radiotherapy (IMRT) is medically necessary for this case for the following reason:  Previous treatment to this area and to limit dose to small bowel and left pelvic bony area including the femoral head and neck area.  PLAN:  The patient will receive 50.4 Gy in 28 fractions using helical IMRT techniques.   Special treatment procedure note  Additional time was taken in reviewing the patient's previous treatment directed to the pelvis area. She was treated with intracavitary brachytherapy treatments. Her prior radiation CT planning fields with  irridium implants  were merged with her current CT scan to get assessment of potential overlap with her previous intracavitary brachytherapy treatments. Given the additional time in reviewing these issues and potential for increased complications with the retreatment adjacent  to this area, this constitutes a special treatment procedure.       Billie Lade, PhD, MD

## 2011-10-28 ENCOUNTER — Ambulatory Visit
Admission: RE | Admit: 2011-10-28 | Discharge: 2011-10-28 | Disposition: A | Discharge status: Home / Self Care | Payer: Medicare Other | Source: Ambulatory Visit | Attending: Radiation Oncology | Admitting: Radiation Oncology

## 2011-10-28 DIAGNOSIS — C775 Secondary and unspecified malignant neoplasm of intrapelvic lymph nodes: Secondary | ICD-10-CM

## 2011-10-28 NOTE — Progress Notes (Signed)
   Department of Radiation Oncology  Phone:  [phone] Fax:        [phone]   IMRT device note  Today the patient had construction of her IMRT.device. The patient will be treated with 5.3 sinogram segments on the tomotherapy unit. This constitutes 1 IMRT device.  -----------------------------------  Kelly Lade, PhD, MD

## 2011-10-29 ENCOUNTER — Ambulatory Visit
Admission: RE | Admit: 2011-10-29 | Discharge: 2011-10-29 | Disposition: A | Discharge status: Home / Self Care | Payer: Medicare Other | Source: Ambulatory Visit | Attending: Radiation Oncology | Admitting: Radiation Oncology

## 2011-10-29 VITALS — BP 161/81 | HR 66 | Temp 97.1°F | Wt 106.4 lb

## 2011-10-29 DIAGNOSIS — C775 Secondary and unspecified malignant neoplasm of intrapelvic lymph nodes: Secondary | ICD-10-CM

## 2011-10-29 NOTE — Progress Notes (Signed)
Here for routine weekly under treat visit for external beam radiation of pelvis.Teach back on side effects completed as patient had HDR treatment in 2009.Marland KitchenPatient able to verbalize how to take care of self. No concerns voiced today.Given radiation Therapy and You Booklet.

## 2011-10-29 NOTE — Progress Notes (Signed)
   Department of Radiation Oncology  Phone:  [phone] Fax:        [phone]   Weekly Management Note Current Dose:  3.6  Gy  Projected Dose: 50.4  Gy   Narrative:  The patient presents for routine under treatment assessment.  MVCT images were reviewed.  The chart was checked.  She is tolerating her radiation therapy well without side effects at this time.  Physical Findings: Weight: 106 lb 6.4 oz (48.263 kg). The lungs are clear. The heart has regular rhythm and rate. The abdomen is soft and nontender with normal bowel sounds.  Impression:  The patient is tolerating radiation.  Plan:  Continue treatment as planned. -----------------------------------  Billie Lade, PhD, MD

## 2011-10-30 ENCOUNTER — Ambulatory Visit
Admission: RE | Admit: 2011-10-30 | Discharge: 2011-10-30 | Disposition: A | Discharge status: Home / Self Care | Payer: Medicare Other | Source: Ambulatory Visit | Attending: Radiation Oncology | Admitting: Radiation Oncology

## 2011-10-31 ENCOUNTER — Ambulatory Visit
Admission: RE | Admit: 2011-10-31 | Discharge: 2011-10-31 | Disposition: A | Discharge status: Home / Self Care | Payer: Medicare Other | Source: Ambulatory Visit | Attending: Radiation Oncology | Admitting: Radiation Oncology

## 2011-11-01 ENCOUNTER — Ambulatory Visit
Admission: RE | Admit: 2011-11-01 | Discharge: 2011-11-01 | Disposition: A | Discharge status: Home / Self Care | Payer: Medicare Other | Source: Ambulatory Visit | Attending: Radiation Oncology | Admitting: Radiation Oncology

## 2011-11-04 ENCOUNTER — Ambulatory Visit
Admission: RE | Admit: 2011-11-04 | Discharge: 2011-11-04 | Disposition: A | Discharge status: Home / Self Care | Payer: Medicare Other | Source: Ambulatory Visit | Attending: Radiation Oncology | Admitting: Radiation Oncology

## 2011-11-05 ENCOUNTER — Ambulatory Visit
Admission: RE | Admit: 2011-11-05 | Discharge: 2011-11-05 | Disposition: A | Discharge status: Home / Self Care | Payer: Medicare Other | Source: Ambulatory Visit | Attending: Radiation Oncology | Admitting: Radiation Oncology

## 2011-11-05 DIAGNOSIS — C549 Malignant neoplasm of corpus uteri, unspecified: Secondary | ICD-10-CM

## 2011-11-05 NOTE — Progress Notes (Signed)
HERE TODAY FOR PUT OF PELVIS.  NO C/O TODAY.  HAS SOME DIARRHEA BUT NOT OFTEN OR REPEATEDLY

## 2011-11-05 NOTE — Progress Notes (Signed)
Weekly Management Note Current Dose:12.6 Gy  Projected Dose:50.4 Gy   Narrative:  The patient presents for routine under treatment assessment.  CBCT/MVCT images/Port film x-rays were reviewed.  The chart was checked. Doing well. No complaints.  Had a few loose stools but that is normal for her. No nausea or fatigue.   Physical Findings:  Unchanged  Vitals: There were no vitals filed for this visit. Weight:  Wt Readings from Last 3 Encounters:  11/05/11 107 lb 4.8 oz (48.671 kg)  10/29/11 106 lb 6.4 oz (48.263 kg)  10/16/11 107 lb 9.6 oz (48.807 kg)   Lab Results  Component Value Date   WBC 28.8* 10/01/2011   HGB 10.0* 10/01/2011   HCT 30.4* 10/01/2011   MCV 107.7* 10/01/2011   PLT 118* 10/01/2011   Lab Results  Component Value Date   CREATININE 0.72 10/01/2011   BUN 14 10/01/2011   NA 144 10/01/2011   K 3.7 10/01/2011   CL 107 10/01/2011   CO2 28 10/01/2011     Impression:  The patient is tolerating radiation.  Plan:  Continue treatment as planned. We discussed dietary modifications, etc.

## 2011-11-06 ENCOUNTER — Ambulatory Visit
Admission: RE | Admit: 2011-11-06 | Discharge: 2011-11-06 | Disposition: A | Discharge status: Home / Self Care | Payer: Medicare Other | Source: Ambulatory Visit | Attending: Radiation Oncology | Admitting: Radiation Oncology

## 2011-11-07 ENCOUNTER — Ambulatory Visit
Admission: RE | Admit: 2011-11-07 | Discharge: 2011-11-07 | Disposition: A | Discharge status: Home / Self Care | Payer: Medicare Other | Source: Ambulatory Visit | Attending: Radiation Oncology | Admitting: Radiation Oncology

## 2011-11-08 ENCOUNTER — Ambulatory Visit
Admission: RE | Admit: 2011-11-08 | Discharge: 2011-11-08 | Disposition: A | Discharge status: Home / Self Care | Payer: Medicare Other | Source: Ambulatory Visit | Attending: Radiation Oncology | Admitting: Radiation Oncology

## 2011-11-11 ENCOUNTER — Ambulatory Visit
Admission: RE | Admit: 2011-11-11 | Discharge: 2011-11-11 | Disposition: A | Discharge status: Home / Self Care | Payer: Medicare Other | Source: Ambulatory Visit | Attending: Radiation Oncology | Admitting: Radiation Oncology

## 2011-11-11 DIAGNOSIS — Z79899 Other long term (current) drug therapy: Secondary | ICD-10-CM | POA: Diagnosis not present

## 2011-11-11 DIAGNOSIS — C55 Malignant neoplasm of uterus, part unspecified: Secondary | ICD-10-CM | POA: Diagnosis not present

## 2011-11-11 DIAGNOSIS — Z510 Encounter for antineoplastic radiation therapy: Secondary | ICD-10-CM | POA: Diagnosis not present

## 2011-11-12 ENCOUNTER — Ambulatory Visit
Admission: RE | Admit: 2011-11-12 | Discharge: 2011-11-12 | Disposition: A | Discharge status: Home / Self Care | Payer: Medicare Other | Source: Ambulatory Visit | Attending: Radiation Oncology | Admitting: Radiation Oncology

## 2011-11-12 ENCOUNTER — Ambulatory Visit: Admission: RE | Admit: 2011-11-12 | Payer: Medicare Other | Source: Ambulatory Visit

## 2011-11-12 VITALS — BP 170/86 | HR 61 | Temp 97.3°F | Wt 108.6 lb

## 2011-11-12 DIAGNOSIS — C55 Malignant neoplasm of uterus, part unspecified: Secondary | ICD-10-CM | POA: Diagnosis not present

## 2011-11-12 DIAGNOSIS — Z79899 Other long term (current) drug therapy: Secondary | ICD-10-CM | POA: Diagnosis not present

## 2011-11-12 DIAGNOSIS — Z510 Encounter for antineoplastic radiation therapy: Secondary | ICD-10-CM | POA: Diagnosis not present

## 2011-11-12 DIAGNOSIS — C775 Secondary and unspecified malignant neoplasm of intrapelvic lymph nodes: Secondary | ICD-10-CM

## 2011-11-12 NOTE — Progress Notes (Signed)
Patient here for radiation treatment of pelvis for recurrent endometrial cancer.Tolerating treatment well. No skin changes.Has noticed  That she feels somewhat claustrophobic with treatment the last few days.BP  Up 170/86. Has old xanax prescription of .25 mg that she may use. Denies fatigue.

## 2011-11-12 NOTE — Progress Notes (Signed)
   Department of Radiation Oncology  Phone:  [phone] Fax:        [phone]   Weekly Management Note Current Dose:  21.6 Gy  Projected Dose: 30.0  Gy   Narrative:  The patient presents for routine under treatment assessment.  CBCT/MVCT images/Port film x-rays were reviewed.  The chart was checked. She overall is tolerating the treatments well. She denies any nausea or bowel problems or urination difficulties.  Patient has noticed some anxiety the set up and I have  recommend she use her Xanax which she has at home.  Physical Findings: Weight: 108 lb 9.6 oz (49.261 kg). The abdomen is soft and nontender with normal bowel normal bowel sounds.  Impression:  The patient is tolerating radiation.  Plan:  Continue treatment as planned.                                                                                       Billie Lade, PhD, MD

## 2011-11-13 ENCOUNTER — Ambulatory Visit
Admission: RE | Admit: 2011-11-13 | Discharge: 2011-11-13 | Disposition: A | Discharge status: Home / Self Care | Payer: Medicare Other | Source: Ambulatory Visit | Attending: Radiation Oncology | Admitting: Radiation Oncology

## 2011-11-13 ENCOUNTER — Ambulatory Visit: Payer: Medicare Other

## 2011-11-13 DIAGNOSIS — Z510 Encounter for antineoplastic radiation therapy: Secondary | ICD-10-CM | POA: Diagnosis not present

## 2011-11-13 DIAGNOSIS — C55 Malignant neoplasm of uterus, part unspecified: Secondary | ICD-10-CM | POA: Diagnosis not present

## 2011-11-13 DIAGNOSIS — Z79899 Other long term (current) drug therapy: Secondary | ICD-10-CM | POA: Diagnosis not present

## 2011-11-15 ENCOUNTER — Other Ambulatory Visit: Payer: Self-pay

## 2011-11-15 ENCOUNTER — Ambulatory Visit
Admission: RE | Admit: 2011-11-15 | Discharge: 2011-11-15 | Disposition: A | Discharge status: Home / Self Care | Payer: Medicare Other | Source: Ambulatory Visit | Attending: Radiation Oncology | Admitting: Radiation Oncology

## 2011-11-15 DIAGNOSIS — C55 Malignant neoplasm of uterus, part unspecified: Secondary | ICD-10-CM | POA: Diagnosis not present

## 2011-11-15 DIAGNOSIS — Z79899 Other long term (current) drug therapy: Secondary | ICD-10-CM | POA: Diagnosis not present

## 2011-11-15 DIAGNOSIS — C541 Malignant neoplasm of endometrium: Secondary | ICD-10-CM

## 2011-11-15 DIAGNOSIS — Z510 Encounter for antineoplastic radiation therapy: Secondary | ICD-10-CM | POA: Diagnosis not present

## 2011-11-18 ENCOUNTER — Ambulatory Visit
Admission: RE | Admit: 2011-11-18 | Discharge: 2011-11-18 | Disposition: A | Discharge status: Home / Self Care | Payer: Medicare Other | Source: Ambulatory Visit | Attending: Radiation Oncology | Admitting: Radiation Oncology

## 2011-11-18 ENCOUNTER — Other Ambulatory Visit: Payer: Self-pay

## 2011-11-18 ENCOUNTER — Ambulatory Visit: Payer: BC Managed Care – PPO | Admitting: Oncology

## 2011-11-18 ENCOUNTER — Telehealth: Payer: Self-pay

## 2011-11-18 ENCOUNTER — Other Ambulatory Visit: Payer: BC Managed Care – PPO | Admitting: Lab

## 2011-11-18 DIAGNOSIS — Z510 Encounter for antineoplastic radiation therapy: Secondary | ICD-10-CM | POA: Diagnosis not present

## 2011-11-18 DIAGNOSIS — Z79899 Other long term (current) drug therapy: Secondary | ICD-10-CM | POA: Diagnosis not present

## 2011-11-18 DIAGNOSIS — C55 Malignant neoplasm of uterus, part unspecified: Secondary | ICD-10-CM | POA: Diagnosis not present

## 2011-11-18 NOTE — Telephone Encounter (Signed)
Spoke with [name] and she is doing fine.  Will r/s today's appt. for early August after radiation is completed per Dr. Darrold Span.  Cancelled today"s appt.

## 2011-11-19 ENCOUNTER — Ambulatory Visit
Admission: RE | Admit: 2011-11-19 | Discharge: 2011-11-19 | Disposition: A | Discharge status: Home / Self Care | Payer: Medicare Other | Source: Ambulatory Visit | Attending: Radiation Oncology | Admitting: Radiation Oncology

## 2011-11-19 DIAGNOSIS — Z510 Encounter for antineoplastic radiation therapy: Secondary | ICD-10-CM | POA: Diagnosis not present

## 2011-11-19 DIAGNOSIS — C55 Malignant neoplasm of uterus, part unspecified: Secondary | ICD-10-CM | POA: Diagnosis not present

## 2011-11-19 DIAGNOSIS — C775 Secondary and unspecified malignant neoplasm of intrapelvic lymph nodes: Secondary | ICD-10-CM

## 2011-11-19 DIAGNOSIS — Z79899 Other long term (current) drug therapy: Secondary | ICD-10-CM | POA: Diagnosis not present

## 2011-11-19 NOTE — Progress Notes (Signed)
HERE TODAY FOR PUT OF PELVIS.  NO C/O VOICED, HAS GOOD ENERGY AND WALKS EVERY MORNING.  NO PAIN AND SAYS SKIN IS DOING GOOD.

## 2011-11-19 NOTE — Progress Notes (Signed)
Weekly Management Note Current Dose: 28.8  Gy  Projected Dose:50.4Gy   Narrative:  The patient presents for routine under treatment assessment.  CBCT/MVCT images/Port film x-rays were reviewed.  The chart was checked.  She is tolerating the treatment well this week without any side effects. Patient continues to be quite active and walks on a regular basis.  Physical Findings: Weight: 108.6 pounds. The abdomen is soft and nontender with normal bowel sounds.  Impression:  The patient is tolerating radiation well  Plan:  Continue treatment as planned to 5040 cGy. -----------------------------------  Billie Lade, PhD, MD

## 2011-11-20 ENCOUNTER — Ambulatory Visit
Admission: RE | Admit: 2011-11-20 | Discharge: 2011-11-20 | Disposition: A | Discharge status: Home / Self Care | Payer: Medicare Other | Source: Ambulatory Visit | Attending: Radiation Oncology | Admitting: Radiation Oncology

## 2011-11-20 DIAGNOSIS — Z510 Encounter for antineoplastic radiation therapy: Secondary | ICD-10-CM | POA: Diagnosis not present

## 2011-11-20 DIAGNOSIS — C55 Malignant neoplasm of uterus, part unspecified: Secondary | ICD-10-CM | POA: Diagnosis not present

## 2011-11-20 DIAGNOSIS — Z79899 Other long term (current) drug therapy: Secondary | ICD-10-CM | POA: Diagnosis not present

## 2011-11-21 ENCOUNTER — Telehealth: Payer: Self-pay | Admitting: *Deleted

## 2011-11-21 ENCOUNTER — Ambulatory Visit
Admission: RE | Admit: 2011-11-21 | Discharge: 2011-11-21 | Disposition: A | Discharge status: Home / Self Care | Payer: Medicare Other | Source: Ambulatory Visit | Attending: Radiation Oncology | Admitting: Radiation Oncology

## 2011-11-21 DIAGNOSIS — Z510 Encounter for antineoplastic radiation therapy: Secondary | ICD-10-CM | POA: Diagnosis not present

## 2011-11-21 DIAGNOSIS — C55 Malignant neoplasm of uterus, part unspecified: Secondary | ICD-10-CM | POA: Diagnosis not present

## 2011-11-21 DIAGNOSIS — Z79899 Other long term (current) drug therapy: Secondary | ICD-10-CM | POA: Diagnosis not present

## 2011-11-21 NOTE — Telephone Encounter (Signed)
left patient voice message informin the patient of the new date and time on 12-18-2011 starting at 10:00 am

## 2011-11-22 ENCOUNTER — Ambulatory Visit
Admission: RE | Admit: 2011-11-22 | Discharge: 2011-11-22 | Disposition: A | Discharge status: Home / Self Care | Payer: Medicare Other | Source: Ambulatory Visit | Attending: Radiation Oncology | Admitting: Radiation Oncology

## 2011-11-22 DIAGNOSIS — Z510 Encounter for antineoplastic radiation therapy: Secondary | ICD-10-CM | POA: Diagnosis not present

## 2011-11-22 DIAGNOSIS — C55 Malignant neoplasm of uterus, part unspecified: Secondary | ICD-10-CM | POA: Diagnosis not present

## 2011-11-22 DIAGNOSIS — Z79899 Other long term (current) drug therapy: Secondary | ICD-10-CM | POA: Diagnosis not present

## 2011-11-25 ENCOUNTER — Ambulatory Visit
Admission: RE | Admit: 2011-11-25 | Discharge: 2011-11-25 | Disposition: A | Discharge status: Home / Self Care | Payer: Medicare Other | Source: Ambulatory Visit | Attending: Radiation Oncology | Admitting: Radiation Oncology

## 2011-11-25 DIAGNOSIS — C55 Malignant neoplasm of uterus, part unspecified: Secondary | ICD-10-CM | POA: Diagnosis not present

## 2011-11-25 DIAGNOSIS — Z510 Encounter for antineoplastic radiation therapy: Secondary | ICD-10-CM | POA: Diagnosis not present

## 2011-11-25 DIAGNOSIS — Z79899 Other long term (current) drug therapy: Secondary | ICD-10-CM | POA: Diagnosis not present

## 2011-11-26 ENCOUNTER — Ambulatory Visit
Admission: RE | Admit: 2011-11-26 | Discharge: 2011-11-26 | Disposition: A | Discharge status: Home / Self Care | Payer: Medicare Other | Source: Ambulatory Visit | Attending: Radiation Oncology | Admitting: Radiation Oncology

## 2011-11-26 VITALS — BP 169/89 | HR 60 | Temp 97.5°F | Wt 107.8 lb

## 2011-11-26 DIAGNOSIS — C775 Secondary and unspecified malignant neoplasm of intrapelvic lymph nodes: Secondary | ICD-10-CM

## 2011-11-26 DIAGNOSIS — Z510 Encounter for antineoplastic radiation therapy: Secondary | ICD-10-CM | POA: Diagnosis not present

## 2011-11-26 DIAGNOSIS — C55 Malignant neoplasm of uterus, part unspecified: Secondary | ICD-10-CM | POA: Diagnosis not present

## 2011-11-26 DIAGNOSIS — Z79899 Other long term (current) drug therapy: Secondary | ICD-10-CM | POA: Diagnosis not present

## 2011-11-26 NOTE — Progress Notes (Signed)
Weekly Management Note Current Dose:37.8 Gy  Projected Dose:50.4 Gy   Narrative:  The patient presents for routine under treatment assessment.  CBCT/MVCT images/Port film x-rays were reviewed.  The chart was checked. She continues to have some anxiety with the overall set up. The patient occasionally will take 0.25 mg Xanax prior to her radiation treatment. She denies the pelvic pain, nausea, loose bowels or diarrhea. She denies any urination difficulties.  Physical Findings:  Unchanged  Vitals:  Filed Vitals:   11/26/11 1135  BP: 169/89  Pulse: 60  Temp: 97.5 F (36.4 C)   Weight:  Wt Readings from Last 3 Encounters:  11/26/11 107 lb 12.8 oz (48.898 kg)  11/19/11 108 lb 9.6 oz (49.261 kg)  11/12/11 108 lb 9.6 oz (49.261 kg)   Lab Results  Component Value Date   WBC 28.8* 10/01/2011   HGB 10.0* 10/01/2011   HCT 30.4* 10/01/2011   MCV 107.7* 10/01/2011   PLT 118* 10/01/2011   Lab Results  Component Value Date   CREATININE 0.72 10/01/2011   BUN 14 10/01/2011   NA 144 10/01/2011   K 3.7 10/01/2011   CL 107 10/01/2011   CO2 28 10/01/2011     Impression:  The patient is tolerating radiation.  Plan:  Continue treatment as planned. -----------------------------------  Billie Lade, PhD, MD

## 2011-11-26 NOTE — Progress Notes (Signed)
Here for routine weekly under treat visit with md for radiation of pelvis (recurrent endometrial ca) Denies pain. No physical changes except occasional loose stool. Continued anxiety during radiation.Has tried taking xanax but still has uneasiness when she comes for treatment. Blood pressure elevated from normal but states it is fine when she takes at home.

## 2011-11-27 ENCOUNTER — Ambulatory Visit
Admission: RE | Admit: 2011-11-27 | Discharge: 2011-11-27 | Disposition: A | Discharge status: Home / Self Care | Payer: Medicare Other | Source: Ambulatory Visit | Attending: Radiation Oncology | Admitting: Radiation Oncology

## 2011-11-27 DIAGNOSIS — C55 Malignant neoplasm of uterus, part unspecified: Secondary | ICD-10-CM | POA: Diagnosis not present

## 2011-11-27 DIAGNOSIS — Z510 Encounter for antineoplastic radiation therapy: Secondary | ICD-10-CM | POA: Diagnosis not present

## 2011-11-27 DIAGNOSIS — Z79899 Other long term (current) drug therapy: Secondary | ICD-10-CM | POA: Diagnosis not present

## 2011-11-28 ENCOUNTER — Ambulatory Visit
Admission: RE | Admit: 2011-11-28 | Discharge: 2011-11-28 | Disposition: A | Discharge status: Home / Self Care | Payer: Medicare Other | Source: Ambulatory Visit | Attending: Radiation Oncology | Admitting: Radiation Oncology

## 2011-11-28 DIAGNOSIS — Z510 Encounter for antineoplastic radiation therapy: Secondary | ICD-10-CM | POA: Diagnosis not present

## 2011-11-28 DIAGNOSIS — C55 Malignant neoplasm of uterus, part unspecified: Secondary | ICD-10-CM | POA: Diagnosis not present

## 2011-11-28 DIAGNOSIS — Z79899 Other long term (current) drug therapy: Secondary | ICD-10-CM | POA: Diagnosis not present

## 2011-11-29 ENCOUNTER — Ambulatory Visit
Admission: RE | Admit: 2011-11-29 | Discharge: 2011-11-29 | Disposition: A | Discharge status: Home / Self Care | Payer: Medicare Other | Source: Ambulatory Visit | Attending: Radiation Oncology | Admitting: Radiation Oncology

## 2011-11-29 DIAGNOSIS — Z79899 Other long term (current) drug therapy: Secondary | ICD-10-CM | POA: Diagnosis not present

## 2011-11-29 DIAGNOSIS — C55 Malignant neoplasm of uterus, part unspecified: Secondary | ICD-10-CM | POA: Diagnosis not present

## 2011-11-29 DIAGNOSIS — Z510 Encounter for antineoplastic radiation therapy: Secondary | ICD-10-CM | POA: Diagnosis not present

## 2011-12-02 ENCOUNTER — Ambulatory Visit
Admission: RE | Admit: 2011-12-02 | Discharge: 2011-12-02 | Disposition: A | Discharge status: Home / Self Care | Payer: Medicare Other | Source: Ambulatory Visit | Attending: Radiation Oncology | Admitting: Radiation Oncology

## 2011-12-02 DIAGNOSIS — C775 Secondary and unspecified malignant neoplasm of intrapelvic lymph nodes: Secondary | ICD-10-CM

## 2011-12-02 DIAGNOSIS — Z510 Encounter for antineoplastic radiation therapy: Secondary | ICD-10-CM | POA: Diagnosis not present

## 2011-12-02 DIAGNOSIS — Z79899 Other long term (current) drug therapy: Secondary | ICD-10-CM | POA: Diagnosis not present

## 2011-12-02 DIAGNOSIS — C55 Malignant neoplasm of uterus, part unspecified: Secondary | ICD-10-CM | POA: Diagnosis not present

## 2011-12-02 NOTE — Progress Notes (Signed)
HERE TODAY FOR PUT OF PELVIS.  SAYS THAT SHE IS HAVING SOME PAIN ON RIGHT SIDE SINCE Friday.  TAKING MOTRIN FOR THIS.  HAS HAD LOOSE STOOL BUT NOT DIARRHEA

## 2011-12-02 NOTE — Progress Notes (Signed)
Weekly Management Note Current Dose: 45.0 Gy  Projected Dose:50.4  Gy   Narrative:  The patient presents for routine under treatment assessment.  CBCT/MVCT images/Port film x-rays were reviewed.  The chart was checked.  She has had some mild loose stools but no diarrhea over the past few days. The patient has no bladder irritation or vaginal symptoms. She is taking Xanax prior to her radiation was helps with the set up. She is having some discomfort in the lower back which she attributes to a muscle strain over the weekend.  Physical Findings: Weight:  . The heart has a regular rhythm and rate. The abdomen is soft and nontender with normal bowel sounds.  Impression:  The patient is tolerating radiation well.   Plan:  Continue treatment as planned.

## 2011-12-03 ENCOUNTER — Ambulatory Visit
Admission: RE | Admit: 2011-12-03 | Discharge: 2011-12-03 | Disposition: A | Discharge status: Home / Self Care | Payer: Medicare Other | Source: Ambulatory Visit | Attending: Radiation Oncology | Admitting: Radiation Oncology

## 2011-12-03 DIAGNOSIS — C55 Malignant neoplasm of uterus, part unspecified: Secondary | ICD-10-CM | POA: Diagnosis not present

## 2011-12-03 DIAGNOSIS — Z79899 Other long term (current) drug therapy: Secondary | ICD-10-CM | POA: Diagnosis not present

## 2011-12-03 DIAGNOSIS — Z510 Encounter for antineoplastic radiation therapy: Secondary | ICD-10-CM | POA: Diagnosis not present

## 2011-12-04 ENCOUNTER — Ambulatory Visit
Admission: RE | Admit: 2011-12-04 | Discharge: 2011-12-04 | Disposition: A | Discharge status: Home / Self Care | Payer: Medicare Other | Source: Ambulatory Visit | Attending: Radiation Oncology | Admitting: Radiation Oncology

## 2011-12-04 DIAGNOSIS — Z79899 Other long term (current) drug therapy: Secondary | ICD-10-CM | POA: Diagnosis not present

## 2011-12-04 DIAGNOSIS — C55 Malignant neoplasm of uterus, part unspecified: Secondary | ICD-10-CM | POA: Diagnosis not present

## 2011-12-04 DIAGNOSIS — Z510 Encounter for antineoplastic radiation therapy: Secondary | ICD-10-CM | POA: Diagnosis not present

## 2011-12-05 ENCOUNTER — Ambulatory Visit
Admission: RE | Admit: 2011-12-05 | Discharge: 2011-12-05 | Disposition: A | Discharge status: Home / Self Care | Payer: Medicare Other | Source: Ambulatory Visit | Attending: Radiation Oncology | Admitting: Radiation Oncology

## 2011-12-05 ENCOUNTER — Encounter: Payer: Self-pay | Admitting: Radiation Oncology

## 2011-12-05 VITALS — BP 133/80 | HR 71 | Resp 18 | Wt 107.8 lb

## 2011-12-05 DIAGNOSIS — C55 Malignant neoplasm of uterus, part unspecified: Secondary | ICD-10-CM | POA: Diagnosis not present

## 2011-12-05 DIAGNOSIS — C775 Secondary and unspecified malignant neoplasm of intrapelvic lymph nodes: Secondary | ICD-10-CM

## 2011-12-05 DIAGNOSIS — Z79899 Other long term (current) drug therapy: Secondary | ICD-10-CM | POA: Diagnosis not present

## 2011-12-05 DIAGNOSIS — Z510 Encounter for antineoplastic radiation therapy: Secondary | ICD-10-CM | POA: Diagnosis not present

## 2011-12-05 NOTE — Progress Notes (Signed)
Patient presents to the clinic tonight for PUT with Dr. Dayton Scrape. Patient to receive last treatment tonight. Patient is alert and oriented to person, place, and time. No distress noted. Steady gait noted. Pleasant affect noted. Patient denies pain at this time. Patient reports loose stool but, denies diarrhea. Patient denies nausea, vomiting, headache, dizziness or diarrhea. Patient denies skin changes in treatment area. Patient reports her energy level remains normal. Provided appointment card to patient for follow up with Dr. Roselind Messier. Reported all findings to Dr. Dayton Scrape.

## 2011-12-05 NOTE — Progress Notes (Signed)
Weekly Management Note:  Site:L Pelvis Current Dose:  5040  cGy Projected Dose: 5040  cGy  Narrative: The patient is seen today for routine under treatment assessment. CBCT/MVCT images/port films were reviewed. The chart was reviewed.   She is without complaints today. She did have some loosening of her bowels earlier this week but she did not require Imodium or any other antidiarrheals. She has been using a low residue diet. No significant fatigue. She completes her radiation therapy today.  Physical Examination:  Filed Vitals:   12/05/11 1727  BP: 133/80  Pulse: 71  Resp: 18  .  Weight: 107 lb 12.8 oz (48.898 kg). No change.  Impression: Radiation therapy well tolerated.  Plan: She will see Dr. Roselind Messier in early September for a followup visit.

## 2011-12-06 ENCOUNTER — Telehealth: Payer: Self-pay | Admitting: Radiation Oncology

## 2011-12-06 ENCOUNTER — Ambulatory Visit: Payer: Medicare Other

## 2011-12-06 NOTE — Telephone Encounter (Signed)
Phoned home number listed in demographics to inform patient of rescheduled follow appointment with Dr. Roselind Messier. Patient unavailable. Left message with patient's husband, Ree Kida, detailing 01/27/2012 0930 follow up with Dr. Roselind Messier. Informed Ree Kida of this writers contact number and encouraged him to have patient call with needs. He verbalized understanding.

## 2011-12-09 ENCOUNTER — Ambulatory Visit: Payer: Medicare Other

## 2011-12-09 NOTE — Progress Notes (Incomplete)
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   ***  Plan: The patient has completed radiation treatment. The patient will return to radiation oncology clinic for routine followup in one month. I advised them to call or return sooner if they have any questions or concerns related to their recovery or treatment.  -----------------------------------  Billie Lade, PhD, MD

## 2011-12-15 NOTE — Progress Notes (Signed)
   Department of Radiation Oncology  Phone:  [phone] Fax:        [phone]  Intensity modulated radiation therapy note  On 10/23/2011 the patient had completion of her IMRT plan directed at the left pelvis area. Intensity modulated radiation therapy was chosen over conventional or conformal ration therapy in light of her previous radiation treatment to the pelvis area. Dose volume histograms of the target area as well as critical structures are reviewed excepted and placed in the patient's chart. She will receive 5040 cGy in 28 fractions.  -----------------------------------  Billie Lade, PhD, MD

## 2011-12-15 NOTE — Progress Notes (Signed)
  Radiation Oncology         [phone] ________________________________  Name: [name] MRN: [id number]  Date: 12/05/2011  DOB: 11-02-1940  End of Treatment Note  Diagnosis:   Recurrent papillary serous carcinoma of the uterus     Indication for treatment:  Isolated recurrence within the left pelvis area       Radiation treatment dates:   10/28/2011 through 12/05/2011  Site/dose:   Left pelvis 5040 cGy in 28 fractions  Beams/energy:   Intensity modulated radiation therapy using 6 MV photons. Intensity modulated radiation therapy was chosen in light of the patient's previous treatment to the pelvis area with her intracavitary brachytherapy treatments.  Narrative: The patient tolerated radiation treatment relatively well.   She had some mild loosening of her bowels during the course of treatment. She denied any urinary symptoms.  Plan: The patient has completed radiation treatment. The patient will return to radiation oncology clinic for routine followup in one month. I advised her to call or return sooner if they have any questions or concerns related to their recovery or treatment.  -----------------------------------  Billie Lade, PhD, MD

## 2011-12-18 ENCOUNTER — Encounter: Payer: Self-pay | Admitting: Oncology

## 2011-12-18 ENCOUNTER — Other Ambulatory Visit (HOSPITAL_BASED_OUTPATIENT_CLINIC_OR_DEPARTMENT_OTHER): Payer: BLUE CROSS/BLUE SHIELD | Admitting: Lab

## 2011-12-18 ENCOUNTER — Telehealth: Payer: Self-pay | Admitting: *Deleted

## 2011-12-18 ENCOUNTER — Ambulatory Visit (HOSPITAL_BASED_OUTPATIENT_CLINIC_OR_DEPARTMENT_OTHER): Payer: BC Managed Care – PPO | Admitting: Oncology

## 2011-12-18 VITALS — BP 142/72 | HR 66 | Temp 98.1°F | Resp 20 | Ht 59.0 in | Wt 107.4 lb

## 2011-12-18 DIAGNOSIS — C779 Secondary and unspecified malignant neoplasm of lymph node, unspecified: Secondary | ICD-10-CM

## 2011-12-18 DIAGNOSIS — C549 Malignant neoplasm of corpus uteri, unspecified: Secondary | ICD-10-CM

## 2011-12-18 DIAGNOSIS — C541 Malignant neoplasm of endometrium: Secondary | ICD-10-CM

## 2011-12-18 DIAGNOSIS — C50919 Malignant neoplasm of unspecified site of unspecified female breast: Secondary | ICD-10-CM

## 2011-12-18 DIAGNOSIS — D649 Anemia, unspecified: Secondary | ICD-10-CM

## 2011-12-18 LAB — CBC WITH DIFFERENTIAL/PLATELET
BASO%: 0.4 % (ref 0.0–2.0)
Basophils Absolute: 0 10*3/uL (ref 0.0–0.1)
EOS%: 6 % (ref 0.0–7.0)
Eosinophils Absolute: 0.3 10*3/uL (ref 0.0–0.5)
HCT: 34.3 % — ABNORMAL LOW (ref 34.8–46.6)
HGB: 11.6 g/dL (ref 11.6–15.9)
LYMPH%: 18.5 % (ref 14.0–49.7)
MCH: 34.6 pg — ABNORMAL HIGH (ref 25.1–34.0)
MCHC: 33.9 g/dL (ref 31.5–36.0)
MCV: 102 fL — ABNORMAL HIGH (ref 79.5–101.0)
MONO#: 0.5 10*3/uL (ref 0.1–0.9)
MONO%: 10.3 % (ref 0.0–14.0)
NEUT#: 3.3 10*3/uL (ref 1.5–6.5)
NEUT%: 64.8 % (ref 38.4–76.8)
Platelets: 155 10*3/uL (ref 145–400)
RBC: 3.36 10*6/uL — ABNORMAL LOW (ref 3.70–5.45)
RDW: 13.2 % (ref 11.2–14.5)
WBC: 5.1 10*3/uL (ref 3.9–10.3)
lymph#: 0.9 10*3/uL (ref 0.9–3.3)

## 2011-12-18 LAB — COMPREHENSIVE METABOLIC PANEL
ALT: 17 U/L (ref 0–35)
AST: 26 U/L (ref 0–37)
Albumin: 4 g/dL (ref 3.5–5.2)
Alkaline Phosphatase: 37 U/L — ABNORMAL LOW (ref 39–117)
BUN: 21 mg/dL (ref 6–23)
CO2: 27 mEq/L (ref 19–32)
Calcium: 9.3 mg/dL (ref 8.4–10.5)
Chloride: 104 mEq/L (ref 96–112)
Creatinine, Ser: 0.77 mg/dL (ref 0.50–1.10)
Glucose, Bld: 80 mg/dL (ref 70–99)
Potassium: 4.2 mEq/L (ref 3.5–5.3)
Sodium: 141 mEq/L (ref 135–145)
Total Bilirubin: 0.5 mg/dL (ref 0.3–1.2)
Total Protein: 6.2 g/dL (ref 6.0–8.3)

## 2011-12-18 NOTE — Patient Instructions (Signed)
Dr Roselind Messier will let you know gyn oncology follow up when he sees you in Sept.  If you have other MD appointments and labs around Feb date when you are scheduled back to Dr Darrold Span, let me know and we can adjust schedule here.

## 2011-12-18 NOTE — Telephone Encounter (Signed)
Gave patient appointment for 06-19-2012 starting at 10:30am

## 2011-12-18 NOTE — Progress Notes (Signed)
OFFICE PROGRESS NOTE   12/18/2011   Physicians:P.Gehrig, J.Kinard, T.Brackbill, V.Leschber, S.Ganem   INTERVAL HISTORY:  Patient is seen, alone for visit, in scheduled follow up of her endometrial and breast carcinomas.  History is of microinvasive T1N0 right breast cancer which was ER/PR positive and HER-2 2+ with insufficient material for FISH at diagnosis in 03/1999, treated with lumpectomy with 2 sentinel node evaluation, local radiation and 5 years of Tamoxifen thru 05/2004, then aromatase inhibitor 07/2004 thru 04/2006. 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 She had 6 cycles of taxol/carboplatin through Jan 2010, and vaginal cuff brachytherapy. She did well until some vague LUQ symptoms in Nov. 2012, with CT AP in [facility] Mar 22, 2011 showing left pelvic sidewall involvement. She was taken to laparoscopic evaluation at [facility] by Dr.Gehrig 04-25-2011, with finding of dense fibrosis in the area as well as mass encasing obturator nerve and vein as well as internal iliac vasculature; left ureterolysis was performed. No pathology was submitted from that procedure. We proceeded with initial 3 additional cycles of taxol/ carboplatin (with carbo skin tests) from 05-16-2011 thru 06-28-2011, then repeated CT AP on 07-17-2011. The CT showed improvement in the left pelvic sidewall mass now 1.9 x 2 cm and 3.4 cm cephalocaudad, as compared with 2.5 x 2.2 cm and 5.4 cm on CT 03-22-11, and no progressive disease elsewhere. She saw Dr.Gehrig after the CT, with recommendation for an additonal 3 cycles of same chemotherapy then repeat scan. Cycle 4 was taxol + [place], with carbo skin test and neulasta,  cycle 5 taxol only due to counts and cycle 6 taxol + carboplatin on 09-17-11. Follow up CT in May unfortunately showed enlargement of the left pelvic sidewall involvement. She went to  IMRT by Dr Roselind Messier, that completed 12-05-11. She tolerated the IMRT very well, with no significant diarrhea, fatigue or skin reaction. She is to see Dr Roselind Messier on 01-27-12 and we expect gyn oncology appointment to be set up from there.  Patient has been feeling very well since completion of IMRT. She has no pelvic or abdominal discomfort, excellent energy and appetite is at baseline. She has had no fever or symptoms of infection, no bleeding, no shortness of breath or other respiratory symptoms. She does not have a central catheter. She is not aware of changes on breast self exam. Remainder of 10 point Review of Systems negative.  Objective:  Vital signs in last 24 hours:  BP 142/72  Pulse 66  Temp 98.1 F (36.7 C) (Oral)  Resp 20  Ht 4\' 11"  (1.499 m)  Wt 107 lb 6.4 oz (48.716 kg)  BMI 21.69 kg/m2 Weight is stable overall. Easily ambulatory, looks comfortable, very pleasant as always.   HEENT:PERRLA, sclera clear, anicteric and oropharynx clear, no lesions.  LymphaticsCervical, supraclavicular, and axillary nodes normal.No inguinal adenopathy Resp: clear to auscultation bilaterally and normal percussion bilaterally Cardio: regular rate and rhythm GI: soft, non-tender; bowel sounds normal; no masses,  no organomegaly Extremities: extremities normal, atraumatic, no cyanosis or edema Neuro: nonfocal Breasts: right with well healed surgical scar, no dominant mass, no skin or nipple findings. Left without mass, skin or nipple findings. Nothing in either axilla, no swelling in upper extremities   Lab Results:  Results for orders placed in visit on 12/18/11  CBC WITH DIFFERENTIAL      Component Value Range   WBC 5.1  3.9 - 10.3 10e3/uL   NEUT# 3.3  1.5 - 6.5 10e3/uL   HGB 11.6  11.6 - 15.9 g/dL   HCT 56.2 (*) 13.0 - 86.5 %   Platelets 155  145 - 400 10e3/uL   MCV 102.0 (*) 79.5 - 101.0 fL   MCH 34.6 (*) 25.1 - 34.0 pg   MCHC 33.9  31.5 - 36.0 g/dL   RBC 7.84 (*) 6.96 - 2.95 10e6/uL    RDW 13.2  11.2 - 14.5 %   lymph# 0.9  0.9 - 3.3 10e3/uL   MONO# 0.5  0.1 - 0.9 10e3/uL   Eosinophils Absolute 0.3  0.0 - 0.5 10e3/uL   Basophils Absolute 0.0  0.0 - 0.1 10e3/uL   NEUT% 64.8  38.4 - 76.8 %   LYMPH% 18.5  14.0 - 49.7 %   MONO% 10.3  0.0 - 14.0 %   EOS% 6.0  0.0 - 7.0 %   BASO% 0.4  0.0 - 2.0 %  COMPREHENSIVE METABOLIC PANEL      Component Value Range   Sodium 141  135 - 145 mEq/L   Potassium 4.2  3.5 - 5.3 mEq/L   Chloride 104  96 - 112 mEq/L   CO2 27  19 - 32 mEq/L   Glucose, Bld 80  70 - 99 mg/dL   BUN 21  6 - 23 mg/dL   Creatinine, Ser 2.84  0.50 - 1.10 mg/dL   Total Bilirubin 0.5  0.3 - 1.2 mg/dL   Alkaline Phosphatase 37 (*) 39 - 117 U/L   AST 26  0 - 37 U/L   ALT 17  0 - 35 U/L   Total Protein 6.2  6.0 - 8.3 g/dL   Albumin 4.0  3.5 - 5.2 g/dL   Calcium 9.3  8.4 - 13.2 mg/dL     Studies/Results:  No results found.  Medications: I have reviewed the patient's current medications.  Assessment/Plan: 1. Papillary serous endometrial carcinoma: IB at diagnosis with history as above, left pelvic sidewall recurrence Nov 2012 with progression on additional chemotherapy and now post RT. Follow up per Drs Roselind Messier and Duard Brady 2. T1N0 right breast cancer 03/1999: history as above, not known recurrent, on observation. Last mammograms 10-03-11. 3.mild anemia: improving, as is MCV. Suggest repeating CBC at least in 6 months, which can be at this office or with PCP. Patient will let our office know if she does not need appointment here in 6 months, and I can certainly see her at any time prior if needed.  Patient was in agreement with plan as above. She is considering changing PCP to the office where her husband is followed.  Meerab Maselli P, MD   12/18/2011, 5:15 PM

## 2012-01-13 ENCOUNTER — Ambulatory Visit: Payer: Medicare Other | Admitting: Radiation Oncology

## 2012-01-24 ENCOUNTER — Encounter: Payer: Self-pay | Admitting: Radiation Oncology

## 2012-01-27 ENCOUNTER — Encounter: Payer: Self-pay | Admitting: Radiation Oncology

## 2012-01-27 ENCOUNTER — Ambulatory Visit
Admission: RE | Admit: 2012-01-27 | Discharge: 2012-01-27 | Disposition: A | Discharge status: Home / Self Care | Payer: Medicare Other | Source: Ambulatory Visit | Attending: Radiation Oncology | Admitting: Radiation Oncology

## 2012-01-27 VITALS — BP 152/76 | HR 76 | Temp 97.9°F | Wt 109.6 lb

## 2012-01-27 DIAGNOSIS — C775 Secondary and unspecified malignant neoplasm of intrapelvic lymph nodes: Secondary | ICD-10-CM

## 2012-01-27 NOTE — Progress Notes (Signed)
Radiation Oncology         ([phone] ________________________________  Name: [name] MRN: [id number]  Date: 01/27/2012  DOB: 11-Apr-1941  Follow-Up Visit Note  CC: Rene Paci, MD  Newt Lukes, MD  Diagnosis:   Recurrent papillary serous carcinoma of the uterus  Interval Since Last Radiation:  6 weeks .she was treated for recurrence along the left pelvis region.  Narrative:  The patient returns today for routine follow-up.  She seems to be doing quite well this time. The patient denies any pain in the pelvis area, urination difficulties. She denies any vaginal bleeding. The patient's energy level is good. She has noticed some mild loose stools but not a real problem since completion of her radiation therapy. She denies any rectal bleeding.                           ALLERGIES:  is allergic to ace inhibitors; codeine; and demerol.  Meds: Current Outpatient Prescriptions  Medication Sig Dispense Refill  . ALPRAZolam (XANAX) 0.25 MG tablet Take 0.25 mg by mouth as needed.       Marland Kitchen amLODipine (NORVASC) 10 MG tablet Take 10 mg by mouth daily.      Marland Kitchen aspirin 81 MG tablet Take 81 mg by mouth daily.        Marland Kitchen atenolol (TENORMIN) 25 MG tablet Take 25 mg by mouth 2 (two) times daily.       . Calcium Carbonate-Vitamin D (CALCIUM 600+D PO) Take 1 tablet by mouth 3 (three) times a week.        . cholecalciferol (VITAMIN D) 1000 UNITS tablet Take 1,000 Units by mouth daily.        . Coenzyme Q10 (CO Q10) 100 MG TABS Take 100 mg elemental calcium/kg/hr by mouth daily.        . fish oil-omega-3 fatty acids 1000 MG capsule Take 1 g by mouth daily.        . ondansetron (ZOFRAN) 8 MG tablet TAKE 1 OR 2 TABLETS BY MOUTH EVERY 12 HOURS AS NEEDED NAUSEA  30 tablet  1  . simvastatin (ZOCOR) 20 MG tablet Take 1 tablet (20 mg total) by mouth at bedtime.  90 tablet  3  . ALPHA-LIPOIC ACID 100 MG TABS Take 100 mg by mouth daily.        Marland Kitchen LORazepam (ATIVAN) 0.5 MG tablet TAKE 1 TABLET ORALLY  OR UNDER THE TONGUE EVERY 4 HRS. AS NEEDED FOR NAUSEA  20 tablet  0  . Magnesium 250 MG TABS Take 250 mg by mouth 3 (three) times a week.          Physical Findings: The patient is in no acute distress. Patient is alert and oriented.  weight is 109 lb 9.6 oz (49.714 kg). Her temperature is 97.9 F (36.6 C). Her blood pressure is 152/76 and her pulse is 76. Marland Kitchen  No palpable cervical subclavicular or axillary adenopathy. The lungs clear to auscultation. The heart has regular rhythm and rate. The abdomen is soft and nontender with normal bowel sounds.  Patient wished to defer her pelvic exam. It is too early to consider Pap smear in light of her recent radiation therapy.  Lab Findings: Lab Results  Component Value Date   WBC 5.1 12/18/2011   HGB 11.6 12/18/2011   HCT 34.3* 12/18/2011   MCV 102.0* 12/18/2011   PLT 155 12/18/2011    @LASTCHEM @  Radiographic Findings: No results found.  Impression:  The patient is recovering from the effects of radiation.  Her only side effect this time is some mild loose bowels.  Plan:  I recommend the patient followup with gynecologic oncology next month or in November. She will likely have a pelvic exam as well as a CT scan at that time.  _____________________________________    Billie Lade, PhD, MD

## 2012-01-27 NOTE — Progress Notes (Signed)
Patient here for routine follow up completion of external beam radiation on 12/05/11.Denies pain. No urinary side effects.Has occasional loose intermittent loose stools.Continues to use small dilator post intracavitary radiation.  Three times weekly.Overall doing great.

## 2012-02-13 ENCOUNTER — Other Ambulatory Visit: Payer: Self-pay | Admitting: Gynecologic Oncology

## 2012-02-13 ENCOUNTER — Telehealth: Payer: Self-pay | Admitting: Gynecologic Oncology

## 2012-02-13 DIAGNOSIS — C541 Malignant neoplasm of endometrium: Secondary | ICD-10-CM

## 2012-02-13 NOTE — Telephone Encounter (Signed)
Spoke with the patient about CT scan scheduled for March 18, 2012 at 9:30am at [facility] per Dr. Duard Brady.  Instructed to arrive at 9:15 to register and to be NPO 4 hours before her scan.  Instructed to come to the [facility] on March 17, 2012 for a BUN and Creat level prior to CT and to pick up instructions and oral contrast from GYN Onc.  Instructed to call for any questions or concerns.  Follow up appointment arranged with Dr. Duard Brady for March 26, 2012.

## 2012-02-20 DIAGNOSIS — D649 Anemia, unspecified: Secondary | ICD-10-CM | POA: Diagnosis not present

## 2012-02-20 DIAGNOSIS — E785 Hyperlipidemia, unspecified: Secondary | ICD-10-CM | POA: Diagnosis not present

## 2012-02-20 DIAGNOSIS — Z1331 Encounter for screening for depression: Secondary | ICD-10-CM | POA: Diagnosis not present

## 2012-02-20 DIAGNOSIS — R197 Diarrhea, unspecified: Secondary | ICD-10-CM | POA: Diagnosis not present

## 2012-02-25 DIAGNOSIS — Z23 Encounter for immunization: Secondary | ICD-10-CM | POA: Diagnosis not present

## 2012-03-17 ENCOUNTER — Other Ambulatory Visit (HOSPITAL_BASED_OUTPATIENT_CLINIC_OR_DEPARTMENT_OTHER): Payer: BLUE CROSS/BLUE SHIELD | Admitting: Lab

## 2012-03-17 DIAGNOSIS — C549 Malignant neoplasm of corpus uteri, unspecified: Secondary | ICD-10-CM | POA: Diagnosis not present

## 2012-03-17 DIAGNOSIS — C541 Malignant neoplasm of endometrium: Secondary | ICD-10-CM

## 2012-03-17 LAB — BUN AND CREATININE (CC13)
BUN: 17 mg/dL (ref 7.0–26.0)
Creatinine: 0.8 mg/dL (ref 0.6–1.1)

## 2012-03-18 ENCOUNTER — Ambulatory Visit (HOSPITAL_COMMUNITY)
Admission: RE | Admit: 2012-03-18 | Discharge: 2012-03-18 | Disposition: A | Discharge status: Home / Self Care | Payer: Medicare Other | Source: Ambulatory Visit | Attending: Gynecologic Oncology | Admitting: Gynecologic Oncology

## 2012-03-18 DIAGNOSIS — C55 Malignant neoplasm of uterus, part unspecified: Secondary | ICD-10-CM | POA: Diagnosis not present

## 2012-03-18 DIAGNOSIS — K8020 Calculus of gallbladder without cholecystitis without obstruction: Secondary | ICD-10-CM | POA: Diagnosis not present

## 2012-03-18 DIAGNOSIS — C549 Malignant neoplasm of corpus uteri, unspecified: Secondary | ICD-10-CM | POA: Diagnosis not present

## 2012-03-18 DIAGNOSIS — Z923 Personal history of irradiation: Secondary | ICD-10-CM | POA: Diagnosis not present

## 2012-03-18 DIAGNOSIS — C541 Malignant neoplasm of endometrium: Secondary | ICD-10-CM

## 2012-03-18 DIAGNOSIS — K5730 Diverticulosis of large intestine without perforation or abscess without bleeding: Secondary | ICD-10-CM | POA: Insufficient documentation

## 2012-03-18 MED ORDER — IOHEXOL 300 MG/ML  SOLN
100.0000 mL | Freq: Once | INTRAMUSCULAR | Status: AC | PRN
  Administered 2012-03-18: 100 mL via INTRAVENOUS

## 2012-03-26 ENCOUNTER — Encounter: Payer: Self-pay | Admitting: Gynecologic Oncology

## 2012-03-26 ENCOUNTER — Ambulatory Visit: Payer: Medicare Other | Attending: Gynecologic Oncology | Admitting: Gynecologic Oncology

## 2012-03-26 VITALS — BP 164/84 | HR 78 | Temp 98.4°F | Resp 16 | Ht 59.0 in | Wt 108.0 lb

## 2012-03-26 DIAGNOSIS — I10 Essential (primary) hypertension: Secondary | ICD-10-CM | POA: Insufficient documentation

## 2012-03-26 DIAGNOSIS — Z90710 Acquired absence of both cervix and uterus: Secondary | ICD-10-CM | POA: Insufficient documentation

## 2012-03-26 DIAGNOSIS — Z79899 Other long term (current) drug therapy: Secondary | ICD-10-CM | POA: Diagnosis not present

## 2012-03-26 DIAGNOSIS — Z853 Personal history of malignant neoplasm of breast: Secondary | ICD-10-CM | POA: Insufficient documentation

## 2012-03-26 DIAGNOSIS — C549 Malignant neoplasm of corpus uteri, unspecified: Secondary | ICD-10-CM | POA: Insufficient documentation

## 2012-03-26 DIAGNOSIS — K8020 Calculus of gallbladder without cholecystitis without obstruction: Secondary | ICD-10-CM | POA: Diagnosis not present

## 2012-03-26 DIAGNOSIS — Z9221 Personal history of antineoplastic chemotherapy: Secondary | ICD-10-CM | POA: Diagnosis not present

## 2012-03-26 DIAGNOSIS — C541 Malignant neoplasm of endometrium: Secondary | ICD-10-CM

## 2012-03-26 DIAGNOSIS — Z923 Personal history of irradiation: Secondary | ICD-10-CM | POA: Diagnosis not present

## 2012-03-26 DIAGNOSIS — E7800 Pure hypercholesterolemia, unspecified: Secondary | ICD-10-CM | POA: Diagnosis not present

## 2012-03-26 DIAGNOSIS — K5730 Diverticulosis of large intestine without perforation or abscess without bleeding: Secondary | ICD-10-CM | POA: Insufficient documentation

## 2012-03-26 DIAGNOSIS — E785 Hyperlipidemia, unspecified: Secondary | ICD-10-CM | POA: Insufficient documentation

## 2012-03-26 NOTE — Patient Instructions (Signed)
RTC 3 months with CT scan at that time.

## 2012-03-26 NOTE — Progress Notes (Signed)
Consult Note: Gyn-Onc  [name] 71 y.o. female  CC:  Chief Complaint  Patient presents with  . Endo ca    Follow up    HPI: Patient is seen, alone for visit, in continuing attention to her recurrent endometrial carcinoma, post cycle 6 chemotherapy on 5/13. Cycle 5 was delayed a week due to counts, then taxol only given on 4-16 due to platelets of only 90K that day; she did have neulasta on 08-30-2011 (then enjoyed 2 weeks at beach). She then had radiation to persistent disease on the left pelvic side wall in 8/13.  History is of microinvasive T1N0 right breast cancer which was ER/PR positive and HER-2 2+ with insufficient material for FISH at diagnosis in 03/1999, treated with lumpectomy with 2 sentinel node evaluation, local radiation and 5 years of Tamoxifen thru 05/2004, then aromatase inhibitor 07/2004 thru 04/2006. 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 She had 6 cycles of taxol/carboplatin through Jan 2010, and vaginal cuff brachytherapy. She did well until some vague LUQ symptoms in Nov. 2012, with CT AP in [facility] Mar 22, 2011 showing left pelvic sidewall involvement. She was taken to laparoscopic evaluation 04-25-2011, with findings of dense fibrosis in the area as well as mass encasing obturator nerve and vein as well as internal iliac vasculature; left ureterolysis was performed. No pathology was submitted from that procedure. We proceeded with initial 3 additional cycles of taxol/ carboplatin (with carbo skin tests) from 05-16-2011 thru 06-28-2011, then repeated CT AP on 07-17-2011. The CT showed improvement in the left pelvic sidewall mass now 1.9 x 2 cm and 3.4 cm cephalocaudad, as compared with 2.5 x 2.2 cm and 5.4 cm on CT 03-22-11, and no progressive disease elsewhere. She saw me after the CT, with recommendation for an additonal 3 cycles of  same chemotherapy then repeat scan. Cycle 4 was taxol + [place], with carbo skin test and neulasta, then cycle 5 as above only taxol.   She then completed radiation therapy under the care of Dr. Roselind Messier which she completed in August of 2013. She had a CT scan on November 11 that revealed: IMPRESSION:  1. Today's study demonstrates a positive response to therapy with decreased size of left pelvic sidewall nodal masses, as detailed  above. No new soft tissue masses or new lymphadenopathy is identified within the abdomen or pelvis. Continue attention on follow-up studies is recommended.  2. Cholelithiasis without findings to suggest acute cholecystitis.  3. Colonic diverticulosis without findings to suggest acute diverticulitis at this time.  4. Mild asymmetric urinary bladder wall thickening is unchanged compared to prior examinations and is nonspecific. No definite bladder wall mass is identified at this time.  5. Additional findings, similar to prior examinations, as above.  There have been another lymph node identified on her CT scan in may that measured 2.1 x 1.7 cm. In reviewing her CT report it states that the nodal mass higher up along the pelvic sidewalls is no longer clearly identified although there continues to be some amorphus soft tissue in the region with the previously noted nodal mass resided. This was confirmed with the reading radiologist and communicated to the patient's after today's visit is a was not clear at the time of her CT scan.   Review of Systems Patient has felt well overall since last treatment. She is no longer having any LUQ  abdominal discomfort, and no other abdominal or pelvic pain. Appetite is not as good as prior to chemotherapy, but still reasonable. She does not have any significant peripheral neuropathy, was able to walk on the beach with no difficulty. She has had no fever or symptoms of infection, no shortness of breath or cough, no bleeding, no LE swelling. Bowels  have been moving regularly, no bladder symptoms. Remainder of 10 point Review of Systems negative.  Current Meds:  Outpatient Encounter Prescriptions as of 03/26/2012  Medication Sig Dispense Refill  . ALPHA-LIPOIC ACID 100 MG TABS Take 100 mg by mouth daily.        Marland Kitchen amLODipine (NORVASC) 10 MG tablet Take 10 mg by mouth daily.      Marland Kitchen aspirin 81 MG tablet Take 81 mg by mouth daily.        Marland Kitchen atenolol (TENORMIN) 25 MG tablet Take 25 mg by mouth 2 (two) times daily.       . Calcium Carbonate-Vitamin D (CALCIUM 600+D PO) Take 1 tablet by mouth 3 (three) times a week.        . cholecalciferol (VITAMIN D) 1000 UNITS tablet Take 1,000 Units by mouth daily.        . Coenzyme Q10 (CO Q10) 100 MG TABS Take 100 mg elemental calcium/kg/hr by mouth daily.        . fish oil-omega-3 fatty acids 1000 MG capsule Take 1 g by mouth daily.        . simvastatin (ZOCOR) 20 MG tablet Take 1 tablet (20 mg total) by mouth at bedtime.  90 tablet  3  . ALPRAZolam (XANAX) 0.25 MG tablet Take 0.25 mg by mouth as needed.       Marland Kitchen LORazepam (ATIVAN) 0.5 MG tablet TAKE 1 TABLET ORALLY OR UNDER THE TONGUE EVERY 4 HRS. AS NEEDED FOR NAUSEA  20 tablet  0  . Magnesium 250 MG TABS Take 250 mg by mouth 3 (three) times a week.        . ondansetron (ZOFRAN) 8 MG tablet TAKE 1 OR 2 TABLETS BY MOUTH EVERY 12 HOURS AS NEEDED NAUSEA  30 tablet  1    Allergy:  Allergies  Allergen Reactions  . Ace Inhibitors Anaphylaxis  . Codeine Nausea Only  . Demerol Nausea Only    Social Hx:   History   Social History  . Marital Status: Married    Spouse Name: N/A    Number of Children: N/A  . Years of Education: N/A   Occupational History  . Not on file.   Social History Main Topics  . Smoking status: Former Smoker    Types: Cigarettes    Quit date: 05/13/1962  . Smokeless tobacco: Never Used  . Alcohol Use: 3.0 oz/week    5 Glasses of wine per week  . Drug Use: No  . Sexually Active: No   Other Topics Concern  . Not on  file   Social History Narrative  . No narrative on file    Past Surgical Hx:  Past Surgical History  Procedure Date  . Total abdominal hysterectomy w/ bilateral salpingoophorectomy   . Breast lumpectomy   . Appendectomy   . Tonsillectomy   . Foot surgery     RIGHT    Past Medical Hx:  Past Medical History  Diagnosis Date  . Hypertension   . Palpitations   . History of breast cancer 2000    right breast -- treated with lumpectomy and radiation therarpy, postoperatively with  tamoxifen   . Endometrial cancer 12/2007    s/p total abdominal hysterectomy at [facility] - ovaries were also removed   . Hyperlipidemia   . Hypercholesterolemia   . PVC's (premature ventricular contractions)   . S/P radiation therapy 10/28/11-12/05/11    5040 cGy left pelvis  . S/P radiation therapy     Intracavitary brachytherapy of uterus    Family Hx:  Family History  Problem Relation Age of Onset  . Thrombosis Father     coronary thrombosis  . Hypertension Father   . Heart failure Mother   . Coronary artery disease Brother     Vitals:  Blood pressure 164/84, pulse 78, temperature 98.4 F (36.9 C), temperature source Oral, resp. rate 16, height 4\' 11"  (1.499 m), weight 108 lb (48.988 kg).  Physical Exam: Well-nourished well-developed female in no acute distress.  Neck: No lymphadenopathy no thyromegaly.  Lungs: Clear to auscultation bilaterally.  Cardiovascular: Regular rate and rhythm.  Abdomen: Soft, nontender, nondistended. There is no palpable masses or hepatosplenomegaly. There is no incisional hernias.  Groins: No lymphadenopathy.  Extremities: No edema.  Pelvic external genitalia within normal limits. Bimanual examination does reveal a smooth prominence of the left pelvic sidewall but no definitive mass as compared to the three cm mass that was previously appreciated.  Assessment/Plan:  71 year old with recurrent uterine papillary serous carcinoma initially stage IB diagnosed in  2009. Most recent imaging in May 2013 of this year reveals interval enlargement of her left pelvic sidewall mass consistent with nodal disease. She did receive Taxol only for 2 cycles so it is unclear if the increase in size was due to lack of a platinum agent or not.  She completed radiation therapy and her follow up CT scan is promising.  I would recommend a repeat CT in 3 months with a follow up visit with [place] at that time.     Harrison Paulson A., MD 03/26/2012, 1:47 PM

## 2012-05-14 ENCOUNTER — Ambulatory Visit: Payer: Medicare Other | Admitting: Radiation Oncology

## 2012-05-21 ENCOUNTER — Encounter: Payer: Self-pay | Admitting: Radiation Oncology

## 2012-05-21 ENCOUNTER — Ambulatory Visit
Admission: RE | Admit: 2012-05-21 | Discharge: 2012-05-21 | Disposition: A | Discharge status: Home / Self Care | Payer: Medicare Other | Source: Ambulatory Visit | Attending: Radiation Oncology | Admitting: Radiation Oncology

## 2012-05-21 VITALS — BP 154/71 | HR 69 | Temp 98.6°F | Wt 112.0 lb

## 2012-05-21 DIAGNOSIS — C541 Malignant neoplasm of endometrium: Secondary | ICD-10-CM

## 2012-05-21 NOTE — Progress Notes (Signed)
Radiation Oncology         [phone] ________________________________  Name: [name] MRN: [id number]  Date: 05/21/2012  DOB: [birth year]  Follow-Up Visit Note  CC: Rene Paci, MD  Reece Packer, MD  Diagnosis:   Recurrent papillary serous carcinoma of the uterus  Interval Since Last Radiation:  6 months   Narrative:  The patient returns today for routine follow-up.  She is doing very well at this time. She denies any pelvic pain vaginal bleeding or discharge. She denies any hematuria urinary symptoms or bowel complaints. She occasionally will have loose stools but no significant diarrhea. She denies any rectal bleeding.                              ALLERGIES:  is allergic to ace inhibitors; codeine; and demerol.  Meds: Current Outpatient Prescriptions  Medication Sig Dispense Refill  . ALPHA-LIPOIC ACID 100 MG TABS Take 100 mg by mouth daily.        Marland Kitchen ALPRAZolam (XANAX) 0.25 MG tablet Take 0.25 mg by mouth as needed.       Marland Kitchen amLODipine (NORVASC) 10 MG tablet Take 10 mg by mouth daily.      Marland Kitchen aspirin 81 MG tablet Take 81 mg by mouth daily.        Marland Kitchen atenolol (TENORMIN) 25 MG tablet Take 25 mg by mouth 2 (two) times daily.       . Calcium Carbonate-Vitamin D (CALCIUM 600+D PO) Take 1 tablet by mouth 3 (three) times a week.        . cholecalciferol (VITAMIN D) 1000 UNITS tablet Take 1,000 Units by mouth daily.        . Coenzyme Q10 (CO Q10) 100 MG TABS Take 100 mg elemental calcium/kg/hr by mouth daily.        . fish oil-omega-3 fatty acids 1000 MG capsule Take 1 g by mouth daily.        Marland Kitchen LORazepam (ATIVAN) 0.5 MG tablet TAKE 1 TABLET ORALLY OR UNDER THE TONGUE EVERY 4 HRS. AS NEEDED FOR NAUSEA  20 tablet  0  . Magnesium 250 MG TABS Take 250 mg by mouth 3 (three) times a week.        . ondansetron (ZOFRAN) 8 MG tablet TAKE 1 OR 2 TABLETS BY MOUTH EVERY 12 HOURS AS NEEDED NAUSEA  30 tablet  1  . simvastatin (ZOCOR) 20 MG tablet Take 1 tablet (20 mg total) by mouth at  bedtime.  90 tablet  3    Physical Findings: The patient is in no acute distress. Patient is alert and oriented.  weight is 112 lb (50.803 kg). Her temperature is 98.6 F (37 C). Her blood pressure is 154/71 and her pulse is 69. Marland Kitchen  No palpable cervical or supraclavicular or axillary adenopathy. The lungs are clear to auscultation. The heart has regular rhythm and rate the abdomen is soft and nontender with normal bowel sounds.  A pelvic exam is deferred as the patient will be seen in gynecologic oncology next month  Lab Findings: Lab Results  Component Value Date   WBC 5.1 12/18/2011   HGB 11.6 12/18/2011   HCT 34.3* 12/18/2011   MCV 102.0* 12/18/2011   PLT 155 12/18/2011    @LASTCHEM @  Radiographic Findings: Patient underwent a CT scan on 03/18/2012. This showed good response to her treatment with no new problems.  Impression:  The patient is doing well at this  time. As above she will undergo pelvic exam and Pap smear and gynecologic oncology next month  Plan:  Patient is been scheduled for routine followup in 6 months but this can be canceled if gynecologic oncology sees the patient every 3 months.  _____________________________________  -----------------------------------  Billie Lade, PhD, MD

## 2012-05-21 NOTE — Progress Notes (Signed)
Patient here for routine follow up completion of radiation for recurrent pelvis cancer completed 12/05/11.Denies pain.Asymptomatic of urinary or bowel problems.States she feels very well.

## 2012-05-28 ENCOUNTER — Other Ambulatory Visit: Payer: Self-pay | Admitting: *Deleted

## 2012-05-28 DIAGNOSIS — C541 Malignant neoplasm of endometrium: Secondary | ICD-10-CM

## 2012-06-19 ENCOUNTER — Other Ambulatory Visit: Payer: Medicare Other | Admitting: Lab

## 2012-06-19 ENCOUNTER — Ambulatory Visit: Payer: Medicare Other | Admitting: Oncology

## 2012-06-22 ENCOUNTER — Encounter (HOSPITAL_COMMUNITY): Payer: Self-pay

## 2012-06-22 ENCOUNTER — Ambulatory Visit (HOSPITAL_COMMUNITY)
Admission: RE | Admit: 2012-06-22 | Discharge: 2012-06-22 | Disposition: A | Discharge status: Home / Self Care | Payer: Medicare Other | Source: Ambulatory Visit | Attending: Gynecologic Oncology | Admitting: Gynecologic Oncology

## 2012-06-22 DIAGNOSIS — K7689 Other specified diseases of liver: Secondary | ICD-10-CM | POA: Diagnosis not present

## 2012-06-22 DIAGNOSIS — Z923 Personal history of irradiation: Secondary | ICD-10-CM | POA: Insufficient documentation

## 2012-06-22 DIAGNOSIS — C549 Malignant neoplasm of corpus uteri, unspecified: Secondary | ICD-10-CM | POA: Insufficient documentation

## 2012-06-22 DIAGNOSIS — Z9221 Personal history of antineoplastic chemotherapy: Secondary | ICD-10-CM | POA: Diagnosis not present

## 2012-06-22 DIAGNOSIS — K8020 Calculus of gallbladder without cholecystitis without obstruction: Secondary | ICD-10-CM | POA: Diagnosis not present

## 2012-06-22 DIAGNOSIS — K5730 Diverticulosis of large intestine without perforation or abscess without bleeding: Secondary | ICD-10-CM | POA: Diagnosis not present

## 2012-06-22 DIAGNOSIS — R1909 Other intra-abdominal and pelvic swelling, mass and lump: Secondary | ICD-10-CM | POA: Insufficient documentation

## 2012-06-22 DIAGNOSIS — Z8544 Personal history of malignant neoplasm of other female genital organs: Secondary | ICD-10-CM | POA: Diagnosis not present

## 2012-06-22 DIAGNOSIS — C541 Malignant neoplasm of endometrium: Secondary | ICD-10-CM

## 2012-06-22 DIAGNOSIS — Z90710 Acquired absence of both cervix and uterus: Secondary | ICD-10-CM | POA: Insufficient documentation

## 2012-06-22 LAB — POCT I-STAT, CHEM 8
BUN: 18 mg/dL (ref 6–23)
Calcium, Ion: 1.18 mmol/L (ref 1.13–1.30)
Chloride: 104 mEq/L (ref 96–112)
Creatinine, Ser: 0.9 mg/dL (ref 0.50–1.10)
Glucose, Bld: 94 mg/dL (ref 70–99)
HCT: 36 % (ref 36.0–46.0)
Hemoglobin: 12.2 g/dL (ref 12.0–15.0)
Potassium: 3.5 mEq/L (ref 3.5–5.1)
Sodium: 140 mEq/L (ref 135–145)
TCO2: 29 mmol/L (ref 0–100)

## 2012-06-22 MED ORDER — IOHEXOL 300 MG/ML  SOLN
100.0000 mL | Freq: Once | INTRAMUSCULAR | Status: AC | PRN
  Administered 2012-06-22: 100 mL via INTRAVENOUS

## 2012-06-24 ENCOUNTER — Telehealth: Payer: Self-pay | Admitting: Gynecologic Oncology

## 2012-06-24 NOTE — Telephone Encounter (Signed)
Patient called requesting results of her CT scan.  Informed of results.  No questions or concerns voiced.

## 2012-06-25 ENCOUNTER — Ambulatory Visit: Payer: Medicare Other | Admitting: Gynecologic Oncology

## 2012-06-27 ENCOUNTER — Other Ambulatory Visit: Payer: Self-pay

## 2012-07-07 ENCOUNTER — Encounter: Payer: Self-pay | Admitting: Gynecologic Oncology

## 2012-07-07 ENCOUNTER — Ambulatory Visit: Payer: Medicare Other | Attending: Gynecologic Oncology | Admitting: Gynecologic Oncology

## 2012-07-07 VITALS — BP 160/80 | HR 84 | Temp 98.4°F | Resp 18 | Ht 59.0 in | Wt 111.1 lb

## 2012-07-07 DIAGNOSIS — Z7982 Long term (current) use of aspirin: Secondary | ICD-10-CM | POA: Insufficient documentation

## 2012-07-07 DIAGNOSIS — Z853 Personal history of malignant neoplasm of breast: Secondary | ICD-10-CM | POA: Diagnosis not present

## 2012-07-07 DIAGNOSIS — Z9221 Personal history of antineoplastic chemotherapy: Secondary | ICD-10-CM | POA: Insufficient documentation

## 2012-07-07 DIAGNOSIS — I10 Essential (primary) hypertension: Secondary | ICD-10-CM | POA: Diagnosis not present

## 2012-07-07 DIAGNOSIS — Z09 Encounter for follow-up examination after completed treatment for conditions other than malignant neoplasm: Secondary | ICD-10-CM | POA: Insufficient documentation

## 2012-07-07 DIAGNOSIS — Z9079 Acquired absence of other genital organ(s): Secondary | ICD-10-CM | POA: Diagnosis not present

## 2012-07-07 DIAGNOSIS — Z79899 Other long term (current) drug therapy: Secondary | ICD-10-CM | POA: Insufficient documentation

## 2012-07-07 DIAGNOSIS — Z90710 Acquired absence of both cervix and uterus: Secondary | ICD-10-CM | POA: Diagnosis not present

## 2012-07-07 DIAGNOSIS — Z923 Personal history of irradiation: Secondary | ICD-10-CM | POA: Diagnosis not present

## 2012-07-07 DIAGNOSIS — C541 Malignant neoplasm of endometrium: Secondary | ICD-10-CM

## 2012-07-07 DIAGNOSIS — E785 Hyperlipidemia, unspecified: Secondary | ICD-10-CM | POA: Diagnosis not present

## 2012-07-07 DIAGNOSIS — E7800 Pure hypercholesterolemia, unspecified: Secondary | ICD-10-CM | POA: Diagnosis not present

## 2012-07-07 DIAGNOSIS — C549 Malignant neoplasm of corpus uteri, unspecified: Secondary | ICD-10-CM | POA: Diagnosis not present

## 2012-07-07 NOTE — Progress Notes (Signed)
Consult Note: Gyn-Onc  [name] 72 y.o. female  CC:  Chief Complaint  Patient presents with  . Endometrial cancer    Follow up    HPI: Patient is seen for continuing attention to her recurrent endometrial carcinoma, post cycle 6 chemotherapy on 5/13. Cycle 5 was delayed a week due to counts, then taxol only given on 4-16 due to platelets of only 90K that day; she did have neulasta on 08-30-2011 (then enjoyed 2 weeks at beach). She then had radiation to persistent disease on the left pelvic side wall in 8/13.   History is of microinvasive T1N0 right breast cancer which was ER/PR positive and HER-2 2+ with insufficient material for FISH at diagnosis in 03/1999, treated with lumpectomy with 2 sentinel node evaluation, local radiation and 5 years of Tamoxifen thru 05/2004, then aromatase inhibitor 07/2004 thru 04/2006.   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 She had 6 cycles of taxol/carboplatin through Jan 2010, and vaginal cuff brachytherapy. She did well until some vague LUQ symptoms in Nov. 2012, with CT AP in [facility] Mar 22, 2011 showing left pelvic sidewall involvement. She was taken to laparoscopic evaluation 04-25-2011, with findings of dense fibrosis in the area as well as mass encasing obturator nerve and vein as well as internal iliac vasculature; left ureterolysis was performed. No pathology was submitted from that procedure. We proceeded with initial 3 additional cycles of taxol/ carboplatin (with carbo skin tests) from 05-16-2011 thru 06-28-2011, then repeated CT AP on 07-17-2011. The CT showed improvement in the left pelvic sidewall mass now 1.9 x 2 cm and 3.4 cm cephalocaudad, as compared with 2.5 x 2.2 cm and 5.4 cm on CT 03-22-11, and no progressive disease elsewhere. She saw me after the CT, with recommendation for an additonal 3 cycles of  same chemotherapy then repeat scan. Cycle 4 was taxol + [place], with carbo skin test and neulasta, then cycle 5 as above only taxol.   She then completed radiation therapy under the care of Dr. Roselind Messier which she completed in August of 2013. She had a CT scan on November 11 that revealed:  IMPRESSION:  1. Today's study demonstrates a positive response to therapy with decreased size of left pelvic sidewall nodal masses, as detailed  above. No new soft tissue masses or new lymphadenopathy is identified within the abdomen or pelvis. Continue attention on follow-up studies is recommended.  2. Cholelithiasis without findings to suggest acute cholecystitis.  3. Colonic diverticulosis without findings to suggest acute diverticulitis at this time.  4. Mild asymmetric urinary bladder wall thickening is unchanged compared to prior examinations and is nonspecific. No definite bladder wall mass is identified at this time.  5. Additional findings, similar to prior examinations, as above.   There have been another lymph node identified on her CT scan in may that measured 2.1 x 1.7 cm. In reviewing her CT report it states that the nodal mass higher up along the pelvic sidewalls is no longer clearly identified although there continues to be some amorphus soft tissue in the region with the previously noted nodal mass resided. This was confirmed with the reading radiologist and communicated to the patient.  Interval History:  CT 06/22/12: Normal urinary bladder. Hysterectomy. Decreased size of the left pelvic peripherally enhancing lesion. This measures 1.8 x 1.7 cm  on image 55/series 2 versus 2.4 x  2.1 cm on the prior exam. No new lesions are identified. No significant free fluid.  Bones/Musculoskeletal: No acute osseous abnormality. Trace L4-L5 anterolisthesis is likely degenerative. There is S-shaped lumbar  spine curvature.  IMPRESSION:  1. Further decreased size of the left pelvic peripherally enhancing lesion.   2. No new sites of new or progressive disease.  3. Esophageal air fluid level suggests dysmotility or gastroesophageal reflux.  4. Cholelithiasis.  Review of Systems  Patient has felt well overall since last treatment. She is no longer having any LUQ abdominal discomfort, and no other abdominal or pelvic pain. Appetite is not as good as prior to chemotherapy, but still reasonable. She does not have any significant peripheral neuropathy, was able to walk on the beach with no difficulty. She has had no fever or symptoms of infection, no shortness of breath or cough, no bleeding, no LE swelling. Bowels have been moving regularly, no bladder symptoms. She has some occasional loose stools but that is improving. Is looking forward to a trip to [place] from June 9 2 June 19. We discussed CT findings today. They were aware of them based on my chart. Remainder of 10 point Review of Systems negative.    Current Meds:  Outpatient Encounter Prescriptions as of 07/07/2012  Medication Sig Dispense Refill  . ALPHA-LIPOIC ACID 100 MG TABS Take 100 mg by mouth daily.        Marland Kitchen ALPRAZolam (XANAX) 0.25 MG tablet Take 0.25 mg by mouth as needed.       Marland Kitchen amLODipine (NORVASC) 10 MG tablet Take 10 mg by mouth daily.      Marland Kitchen aspirin 81 MG tablet Take 81 mg by mouth daily.        Marland Kitchen atenolol (TENORMIN) 25 MG tablet Take 25 mg by mouth 2 (two) times daily.       . Calcium Carbonate-Vitamin D (CALCIUM 600+D PO) Take 1 tablet by mouth 3 (three) times a week.        . cholecalciferol (VITAMIN D) 1000 UNITS tablet Take 1,000 Units by mouth daily.        . Coenzyme Q10 (CO Q10) 100 MG TABS Take 100 mg elemental calcium/kg/hr by mouth daily.        . fish oil-omega-3 fatty acids 1000 MG capsule Take 1 g by mouth daily.        Marland Kitchen LORazepam (ATIVAN) 0.5 MG tablet TAKE 1 TABLET ORALLY OR UNDER THE TONGUE EVERY 4 HRS. AS NEEDED FOR NAUSEA  20 tablet  0  . Magnesium 250 MG TABS Take 250 mg by mouth 3 (three) times a week.        .  ondansetron (ZOFRAN) 8 MG tablet TAKE 1 OR 2 TABLETS BY MOUTH EVERY 12 HOURS AS NEEDED NAUSEA  30 tablet  1  . simvastatin (ZOCOR) 20 MG tablet Take 1 tablet (20 mg total) by mouth at bedtime.  90 tablet  3   No facility-administered encounter medications on file as of 07/07/2012.    Allergy:  Allergies  Allergen Reactions  . Ace Inhibitors Anaphylaxis  . Codeine Nausea Only  . Demerol Nausea Only    Social Hx:   History   Social History  . Marital Status: Married    Spouse Name: N/A    Number of Children: N/A  . Years of Education: N/A   Occupational History  . Not on file.   Social History Main Topics  . Smoking status: Former Smoker    Types: Cigarettes  Quit date: 05/13/1962  . Smokeless tobacco: Never Used  . Alcohol Use: 3.0 oz/week    5 Glasses of wine per week  . Drug Use: No  . Sexually Active: No   Other Topics Concern  . Not on file   Social History Narrative  . No narrative on file    Past Surgical Hx:  Past Surgical History  Procedure Laterality Date  . Total abdominal hysterectomy w/ bilateral salpingoophorectomy    . Breast lumpectomy    . Appendectomy    . Tonsillectomy    . Foot surgery      RIGHT    Past Medical Hx:  Past Medical History  Diagnosis Date  . Hypertension   . Palpitations   . History of breast cancer 2000    right breast -- treated with lumpectomy and radiation therarpy, postoperatively with tamoxifen   . Endometrial cancer 12/2007    s/p total abdominal hysterectomy at [facility] - ovaries were also removed   . Hyperlipidemia   . Hypercholesterolemia   . PVC's (premature ventricular contractions)   . S/P radiation therapy 10/28/11-12/05/11    5040 cGy left pelvis  . S/P radiation therapy     Intracavitary brachytherapy of uterus    Family Hx:  Family History  Problem Relation Age of Onset  . Thrombosis Father     coronary thrombosis  . Hypertension Father   . Heart failure Mother   . Coronary artery disease  Brother     Vitals:  Blood pressure 160/80, pulse 84, temperature 98.4 F (36.9 C), temperature source Oral, resp. rate 18, height 4\' 11"  (1.499 m), weight 111 lb 1.6 oz (50.395 kg).  Physical Exam: Well-nourished well-developed female in no acute distress.   Neck: No lymphadenopathy no thyromegaly.   Lungs: Clear to auscultation bilaterally.   Cardiovascular: Regular rate and rhythm.   Abdomen: Soft, nontender, nondistended. There is no palpable masses or hepatosplenomegaly. There is no incisional hernias.   Groins: No lymphadenopathy.   Extremities: No edema.   Pelvic external genitalia within normal limits. Bimanual examination does reveal a smooth prominence of the left pelvic sidewall but no definitive mass as compared to the three cm mass that was previously appreciated.   Assessment/Plan:  72 year old with recurrent uterine papillary serous carcinoma initially stage IB diagnosed in 2009. Most recent imaging in May 2013 of this year reveals interval enlargement of her left pelvic sidewall mass consistent with nodal disease. She did receive Taxol only for 2 cycles so it is unclear if the increase in size was due to lack of a platinum agent or not.   She completed radiation therapy and her follow up CT scan is promising. I would recommend a repeat CT in 3 months with a follow up visit with [place] at that time.     Ambur Province A., MD 07/07/2012, 1:30 PM

## 2012-07-07 NOTE — Patient Instructions (Signed)
Return to clinic after CT scan in June 2014.

## 2012-07-08 ENCOUNTER — Encounter: Payer: Self-pay | Admitting: Oncology

## 2012-07-08 NOTE — Progress Notes (Signed)
Patient and hubby came in for billing is not correct. She has medicare and then [facility] and some were not getting paid. I sent to billing for review and per Peyton Najjar: This is the route we had to take as it was billed wrong from the start.  Medicare should make a payment on it then it can be sent to [facility].  Any question please let me know.

## 2012-07-09 DIAGNOSIS — M545 Low back pain, unspecified: Secondary | ICD-10-CM | POA: Diagnosis not present

## 2012-07-09 DIAGNOSIS — M4310 Spondylolisthesis, site unspecified: Secondary | ICD-10-CM | POA: Diagnosis not present

## 2012-07-21 ENCOUNTER — Encounter: Payer: Self-pay | Admitting: Oncology

## 2012-07-21 NOTE — Progress Notes (Signed)
Patient came in office. She is concerned about some medicare/bcbs bills. She feels like all not filled. I referred notes to [facility]. 11/2011-03/2012 she had medicare. I looked but didn't see any outstanding. She did say she got EOB from radiology for over $31k and not sure if they are paid either. She has not received any bills, but still thinks all is not billed.

## 2012-07-22 ENCOUNTER — Encounter: Payer: Self-pay | Admitting: Oncology

## 2012-07-22 NOTE — Progress Notes (Signed)
Left msg on pt's vm to return my call.  Want to discuss the EOB she dropped off on 07/21/12.

## 2012-07-22 NOTE — Progress Notes (Signed)
Pt called back wanting to know if her bills were resubmitted to Medicare for payment.  I referred her to the billing department for those answers.

## 2012-07-24 ENCOUNTER — Encounter: Payer: Self-pay | Admitting: Oncology

## 2012-07-28 DIAGNOSIS — IMO0002 Reserved for concepts with insufficient information to code with codable children: Secondary | ICD-10-CM | POA: Diagnosis not present

## 2012-08-13 DIAGNOSIS — R82998 Other abnormal findings in urine: Secondary | ICD-10-CM | POA: Diagnosis not present

## 2012-08-13 DIAGNOSIS — E785 Hyperlipidemia, unspecified: Secondary | ICD-10-CM | POA: Diagnosis not present

## 2012-08-13 DIAGNOSIS — I10 Essential (primary) hypertension: Secondary | ICD-10-CM | POA: Diagnosis not present

## 2012-08-20 DIAGNOSIS — E785 Hyperlipidemia, unspecified: Secondary | ICD-10-CM | POA: Diagnosis not present

## 2012-08-20 DIAGNOSIS — I10 Essential (primary) hypertension: Secondary | ICD-10-CM | POA: Diagnosis not present

## 2012-08-20 DIAGNOSIS — Z0000 Encounter for general adult medical examination without abnormal findings: Secondary | ICD-10-CM | POA: Diagnosis not present

## 2012-08-20 DIAGNOSIS — R3129 Other microscopic hematuria: Secondary | ICD-10-CM | POA: Diagnosis not present

## 2012-08-20 DIAGNOSIS — D649 Anemia, unspecified: Secondary | ICD-10-CM | POA: Diagnosis not present

## 2012-08-20 DIAGNOSIS — C50919 Malignant neoplasm of unspecified site of unspecified female breast: Secondary | ICD-10-CM | POA: Diagnosis not present

## 2012-08-20 DIAGNOSIS — M899 Disorder of bone, unspecified: Secondary | ICD-10-CM | POA: Diagnosis not present

## 2012-08-20 DIAGNOSIS — C55 Malignant neoplasm of uterus, part unspecified: Secondary | ICD-10-CM | POA: Diagnosis not present

## 2012-08-20 DIAGNOSIS — M949 Disorder of cartilage, unspecified: Secondary | ICD-10-CM | POA: Diagnosis not present

## 2012-08-25 DIAGNOSIS — M5137 Other intervertebral disc degeneration, lumbosacral region: Secondary | ICD-10-CM | POA: Diagnosis not present

## 2012-08-25 DIAGNOSIS — M4310 Spondylolisthesis, site unspecified: Secondary | ICD-10-CM | POA: Diagnosis not present

## 2012-08-31 ENCOUNTER — Telehealth: Payer: Self-pay

## 2012-08-31 ENCOUNTER — Other Ambulatory Visit: Payer: Self-pay

## 2012-08-31 DIAGNOSIS — Z853 Personal history of malignant neoplasm of breast: Secondary | ICD-10-CM

## 2012-08-31 DIAGNOSIS — Z1231 Encounter for screening mammogram for malignant neoplasm of breast: Secondary | ICD-10-CM

## 2012-08-31 NOTE — Telephone Encounter (Signed)
Told Ms. Nason the information below regarding the 3D mammogram as noted by Dr. Darrold Span. Ms. Heimann appreciated Dr. Precious Reel input and will probably pay to have the additional 3 D imaging.

## 2012-08-31 NOTE — Telephone Encounter (Signed)
         Reece Packer, MD ','<More Detail >>       Reece Packer, MD  [name]     MRN: [id number] DOB: 1941-02-06     Pt Home: [phone]     Sent: Sheral Flow August 31, 2012 12:43 PM     To: Lorine Bears, RN                          Message    3D/ tomo mammograms have been available in [place] since ~ last summer. They are particularly useful when breast tissue is dense; they take a couple more seconds to do - tho so far none of my patients have been able to tell this difference - and use slightly more radiation, but the images with dense breasts are significantly better. Not all insurance is covering the 3D imaging yet, so the mammogram facilities usually have copay at time of exam, which they refund if insurance picks it up. Mrs Santee still had dense breast tissue on last mammograms in May 2013, so the 3D/ tomo would be very appropriate for her if she wants to do that.

## 2012-08-31 NOTE — Telephone Encounter (Signed)
[name]  was given the  option to have the traditional mammogram vs the 3 d mammogram.  She read about the 3 D mammogram and  wanted to know if the traditional mammogram would be fine for her since she has been having it for all theses years?

## 2012-10-06 ENCOUNTER — Ambulatory Visit
Admission: RE | Admit: 2012-10-06 | Discharge: 2012-10-06 | Disposition: A | Discharge status: Home / Self Care | Payer: BLUE CROSS/BLUE SHIELD | Source: Ambulatory Visit

## 2012-10-06 DIAGNOSIS — Z1231 Encounter for screening mammogram for malignant neoplasm of breast: Secondary | ICD-10-CM

## 2012-10-06 DIAGNOSIS — Z853 Personal history of malignant neoplasm of breast: Secondary | ICD-10-CM | POA: Diagnosis not present

## 2012-10-07 ENCOUNTER — Other Ambulatory Visit: Payer: Self-pay | Admitting: Gynecologic Oncology

## 2012-10-07 DIAGNOSIS — C549 Malignant neoplasm of corpus uteri, unspecified: Secondary | ICD-10-CM

## 2012-10-30 ENCOUNTER — Other Ambulatory Visit (HOSPITAL_BASED_OUTPATIENT_CLINIC_OR_DEPARTMENT_OTHER): Payer: Medicare Other | Admitting: Lab

## 2012-10-30 DIAGNOSIS — C50919 Malignant neoplasm of unspecified site of unspecified female breast: Secondary | ICD-10-CM

## 2012-10-30 DIAGNOSIS — C549 Malignant neoplasm of corpus uteri, unspecified: Secondary | ICD-10-CM | POA: Diagnosis not present

## 2012-10-30 LAB — BUN AND CREATININE (CC13)
BUN: 17 mg/dL (ref 7.0–26.0)
Creatinine: 0.8 mg/dL (ref 0.6–1.1)

## 2012-11-02 ENCOUNTER — Encounter (HOSPITAL_COMMUNITY): Payer: Self-pay

## 2012-11-02 ENCOUNTER — Ambulatory Visit (HOSPITAL_COMMUNITY)
Admission: RE | Admit: 2012-11-02 | Discharge: 2012-11-02 | Disposition: A | Discharge status: Home / Self Care | Payer: Medicare Other | Source: Ambulatory Visit | Attending: Gynecologic Oncology | Admitting: Gynecologic Oncology

## 2012-11-02 DIAGNOSIS — I700 Atherosclerosis of aorta: Secondary | ICD-10-CM | POA: Insufficient documentation

## 2012-11-02 DIAGNOSIS — K8020 Calculus of gallbladder without cholecystitis without obstruction: Secondary | ICD-10-CM | POA: Insufficient documentation

## 2012-11-02 DIAGNOSIS — K5730 Diverticulosis of large intestine without perforation or abscess without bleeding: Secondary | ICD-10-CM | POA: Insufficient documentation

## 2012-11-02 DIAGNOSIS — M5137 Other intervertebral disc degeneration, lumbosacral region: Secondary | ICD-10-CM | POA: Diagnosis not present

## 2012-11-02 DIAGNOSIS — R599 Enlarged lymph nodes, unspecified: Secondary | ICD-10-CM | POA: Diagnosis not present

## 2012-11-02 DIAGNOSIS — C549 Malignant neoplasm of corpus uteri, unspecified: Secondary | ICD-10-CM | POA: Diagnosis not present

## 2012-11-02 DIAGNOSIS — Z90710 Acquired absence of both cervix and uterus: Secondary | ICD-10-CM | POA: Insufficient documentation

## 2012-11-02 DIAGNOSIS — M51379 Other intervertebral disc degeneration, lumbosacral region without mention of lumbar back pain or lower extremity pain: Secondary | ICD-10-CM | POA: Insufficient documentation

## 2012-11-02 DIAGNOSIS — C541 Malignant neoplasm of endometrium: Secondary | ICD-10-CM

## 2012-11-02 MED ORDER — IOHEXOL 300 MG/ML  SOLN
100.0000 mL | Freq: Once | INTRAMUSCULAR | Status: AC | PRN
  Administered 2012-11-02: 100 mL via INTRAVENOUS

## 2012-11-04 DIAGNOSIS — M47817 Spondylosis without myelopathy or radiculopathy, lumbosacral region: Secondary | ICD-10-CM | POA: Diagnosis not present

## 2012-11-05 ENCOUNTER — Encounter: Payer: Self-pay | Admitting: Gynecologic Oncology

## 2012-11-09 ENCOUNTER — Telehealth: Payer: Self-pay | Admitting: Gynecologic Oncology

## 2012-11-09 NOTE — Telephone Encounter (Signed)
Patient informed of CT scan results.  No concerns voiced.

## 2012-11-12 ENCOUNTER — Telehealth: Payer: Self-pay | Admitting: Gynecologic Oncology

## 2012-11-12 NOTE — Telephone Encounter (Signed)
LM for patient to call. PG 

## 2012-11-16 ENCOUNTER — Encounter: Payer: Self-pay | Admitting: Gynecologic Oncology

## 2012-11-17 DIAGNOSIS — M899 Disorder of bone, unspecified: Secondary | ICD-10-CM | POA: Diagnosis not present

## 2012-11-18 DIAGNOSIS — M545 Low back pain, unspecified: Secondary | ICD-10-CM | POA: Diagnosis not present

## 2012-11-18 DIAGNOSIS — M5137 Other intervertebral disc degeneration, lumbosacral region: Secondary | ICD-10-CM | POA: Diagnosis not present

## 2012-11-27 DIAGNOSIS — H2510 Age-related nuclear cataract, unspecified eye: Secondary | ICD-10-CM | POA: Diagnosis not present

## 2012-11-27 DIAGNOSIS — H5200 Hypermetropia, unspecified eye: Secondary | ICD-10-CM | POA: Diagnosis not present

## 2012-11-27 DIAGNOSIS — H40019 Open angle with borderline findings, low risk, unspecified eye: Secondary | ICD-10-CM | POA: Diagnosis not present

## 2012-12-16 ENCOUNTER — Other Ambulatory Visit: Payer: Self-pay

## 2012-12-23 ENCOUNTER — Other Ambulatory Visit: Payer: Self-pay | Admitting: Gynecologic Oncology

## 2012-12-23 DIAGNOSIS — C541 Malignant neoplasm of endometrium: Secondary | ICD-10-CM

## 2012-12-30 ENCOUNTER — Other Ambulatory Visit: Payer: Self-pay | Admitting: Gynecologic Oncology

## 2012-12-30 ENCOUNTER — Ambulatory Visit (HOSPITAL_BASED_OUTPATIENT_CLINIC_OR_DEPARTMENT_OTHER): Payer: Medicare Other | Admitting: Lab

## 2012-12-30 DIAGNOSIS — C549 Malignant neoplasm of corpus uteri, unspecified: Secondary | ICD-10-CM

## 2012-12-30 DIAGNOSIS — C541 Malignant neoplasm of endometrium: Secondary | ICD-10-CM

## 2012-12-30 LAB — BUN AND CREATININE (CC13)
BUN: 16.7 mg/dL (ref 7.0–26.0)
Creatinine: 0.7 mg/dL (ref 0.6–1.1)

## 2013-01-21 DIAGNOSIS — Z23 Encounter for immunization: Secondary | ICD-10-CM | POA: Diagnosis not present

## 2013-01-22 DIAGNOSIS — M4310 Spondylolisthesis, site unspecified: Secondary | ICD-10-CM | POA: Diagnosis not present

## 2013-01-22 DIAGNOSIS — M5137 Other intervertebral disc degeneration, lumbosacral region: Secondary | ICD-10-CM | POA: Diagnosis not present

## 2013-01-22 DIAGNOSIS — M545 Low back pain, unspecified: Secondary | ICD-10-CM | POA: Diagnosis not present

## 2013-01-22 DIAGNOSIS — M76899 Other specified enthesopathies of unspecified lower limb, excluding foot: Secondary | ICD-10-CM | POA: Diagnosis not present

## 2013-01-28 ENCOUNTER — Encounter (HOSPITAL_COMMUNITY): Payer: Self-pay

## 2013-01-28 ENCOUNTER — Ambulatory Visit (HOSPITAL_COMMUNITY)
Admission: RE | Admit: 2013-01-28 | Discharge: 2013-01-28 | Disposition: A | Discharge status: Home / Self Care | Payer: Medicare Other | Source: Ambulatory Visit | Attending: Gynecologic Oncology | Admitting: Gynecologic Oncology

## 2013-01-28 DIAGNOSIS — Z923 Personal history of irradiation: Secondary | ICD-10-CM | POA: Insufficient documentation

## 2013-01-28 DIAGNOSIS — R935 Abnormal findings on diagnostic imaging of other abdominal regions, including retroperitoneum: Secondary | ICD-10-CM | POA: Diagnosis not present

## 2013-01-28 DIAGNOSIS — K8020 Calculus of gallbladder without cholecystitis without obstruction: Secondary | ICD-10-CM | POA: Diagnosis not present

## 2013-01-28 DIAGNOSIS — Z9221 Personal history of antineoplastic chemotherapy: Secondary | ICD-10-CM | POA: Diagnosis not present

## 2013-01-28 DIAGNOSIS — R1909 Other intra-abdominal and pelvic swelling, mass and lump: Secondary | ICD-10-CM | POA: Insufficient documentation

## 2013-01-28 DIAGNOSIS — Z90710 Acquired absence of both cervix and uterus: Secondary | ICD-10-CM | POA: Diagnosis not present

## 2013-01-28 DIAGNOSIS — C549 Malignant neoplasm of corpus uteri, unspecified: Secondary | ICD-10-CM | POA: Diagnosis not present

## 2013-01-28 DIAGNOSIS — C541 Malignant neoplasm of endometrium: Secondary | ICD-10-CM

## 2013-01-28 MED ORDER — IOHEXOL 300 MG/ML  SOLN
100.0000 mL | Freq: Once | INTRAMUSCULAR | Status: AC | PRN
  Administered 2013-01-28: 100 mL via INTRAVENOUS

## 2013-01-29 ENCOUNTER — Other Ambulatory Visit (HOSPITAL_COMMUNITY): Payer: Medicare Other

## 2013-02-01 ENCOUNTER — Telehealth: Payer: Self-pay | Admitting: Gynecologic Oncology

## 2013-02-01 NOTE — Telephone Encounter (Signed)
Patient notified of CT scan results.  Advised that she would be contacted with Dr. Denman George recommendations once she had reviewed the scan.  Instructed to call for any needs or concerns.

## 2013-02-08 DIAGNOSIS — M5137 Other intervertebral disc degeneration, lumbosacral region: Secondary | ICD-10-CM | POA: Diagnosis not present

## 2013-02-08 DIAGNOSIS — M76899 Other specified enthesopathies of unspecified lower limb, excluding foot: Secondary | ICD-10-CM | POA: Diagnosis not present

## 2013-02-24 DIAGNOSIS — M76899 Other specified enthesopathies of unspecified lower limb, excluding foot: Secondary | ICD-10-CM | POA: Diagnosis not present

## 2013-02-25 ENCOUNTER — Ambulatory Visit: Payer: Medicare Other | Attending: Gynecologic Oncology | Admitting: Gynecologic Oncology

## 2013-02-25 ENCOUNTER — Encounter: Payer: Self-pay | Admitting: Gynecologic Oncology

## 2013-02-25 VITALS — BP 152/74 | HR 72 | Temp 98.7°F | Resp 16 | Ht 59.0 in | Wt 110.7 lb

## 2013-02-25 DIAGNOSIS — K8020 Calculus of gallbladder without cholecystitis without obstruction: Secondary | ICD-10-CM | POA: Diagnosis not present

## 2013-02-25 DIAGNOSIS — C541 Malignant neoplasm of endometrium: Secondary | ICD-10-CM

## 2013-02-25 DIAGNOSIS — K5730 Diverticulosis of large intestine without perforation or abscess without bleeding: Secondary | ICD-10-CM | POA: Insufficient documentation

## 2013-02-25 DIAGNOSIS — Z90710 Acquired absence of both cervix and uterus: Secondary | ICD-10-CM | POA: Insufficient documentation

## 2013-02-25 DIAGNOSIS — C549 Malignant neoplasm of corpus uteri, unspecified: Secondary | ICD-10-CM | POA: Insufficient documentation

## 2013-02-25 DIAGNOSIS — Z853 Personal history of malignant neoplasm of breast: Secondary | ICD-10-CM | POA: Diagnosis not present

## 2013-02-25 NOTE — Progress Notes (Signed)
Consult Note: Gyn-Onc  [name] 72 y.o. female  CC:  Chief Complaint  Patient presents with  . Endometrial cancer    Follow up    HPI: Patient is seen for continuing attention to her recurrent endometrial carcinoma, post cycle 6 chemotherapy on 5/13. Cycle 5 was delayed a week due to counts, then taxol only given on 4-16 due to platelets of only 90K that day; she did have neulasta on 08-30-2011 (then enjoyed 2 weeks at beach). She then had radiation to persistent disease on the left pelvic side wall in 8/13.  History is of microinvasive T1N0 right breast cancer which was ER/PR positive and HER-2 2+ with insufficient material for FISH at diagnosis in 03/1999, treated with lumpectomy with 2 sentinel node evaluation, local radiation and 5 years of Tamoxifen thru 05/2004, then aromatase inhibitor 07/2004 thru 04/2006.   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 She had 6 cycles of taxol/carboplatin through Jan 2010, and vaginal cuff brachytherapy. She did well until some vague LUQ symptoms in Nov. 2012, with CT AP in [facility] system Mar 22, 2011 showing left pelvic sidewall involvement. She was taken to laparoscopic evaluation 04-25-2011, with findings of dense fibrosis in the area as well as mass encasing obturator nerve and vein as well as internal iliac vasculature; left ureterolysis was performed. No pathology was submitted from that procedure. We proceeded with initial 3 additional cycles of taxol/ carboplatin (with carbo skin tests) from 05-16-2011 thru 06-28-2011, then repeated CT AP on 07-17-2011. The CT showed improvement in the left pelvic sidewall mass now 1.9 x 2 cm and 3.4 cm cephalocaudad, as compared with 2.5 x 2.2 cm and 5.4 cm on CT 03-22-11, and no progressive disease elsewhere. She saw me after the CT, with recommendation for an additonal 3 cycles of  same chemotherapy then repeat scan. Cycle 4 was taxol + [place], with carbo skin test and neulasta, then cycle 5 as above only taxol.   She then completed radiation therapy under the care of Dr. Roselind Messier which she completed in August of 2013. She had a CT scan on March 23, 2012 that revealed:  IMPRESSION:  1. Today's study demonstrates a positive response to therapy with decreased size of left pelvic sidewall nodal masses, as detailed above. No new soft tissue masses or new lymphadenopathy is identified within the abdomen or pelvis. Continue attention on follow-up studies is recommended.  2. Cholelithiasis without findings to suggest acute cholecystitis.  3. Colonic diverticulosis without findings to suggest acute diverticulitis at this time.  4. Mild asymmetric urinary bladder wall thickening is unchanged compared to prior examinations and is nonspecific. No definite bladder wall mass is identified at this time.  5. Additional findings, similar to prior examinations, as above.   There have been another lymph node identified on her CT scan in may that measured 2.1 x 1.7 cm. In reviewing her CT report it states that the nodal mass higher up along the pelvic sidewalls is no longer clearly identified although there continues to be some amorphus soft tissue in the region with the previously noted nodal mass resided. This was confirmed with the reading radiologist and communicated to the patient.   CT 06/22/12:  Normal urinary bladder. Hysterectomy. Decreased size of the left pelvic peripherally enhancing lesion. This measures 1.8 x 1.7 cm  on image 55/series 2 versus 2.4 x 2.1 cm  on the prior exam. No new lesions are identified. No significant free fluid.  Bones/Musculoskeletal: No acute osseous abnormality. Trace L4-L5 anterolisthesis is likely degenerative. There is S-shaped lumbar  spine curvature.  IMPRESSION:  1. Further decreased size of the left pelvic peripherally enhancing lesion.  2. No new  sites of new or progressive disease.  3. Esophageal air fluid level suggests dysmotility or gastroesophageal reflux.  4. Cholelithiasis.  CT 11/02/2012: The index lesion within the left pelvic side wall measures 2.2 x 2.7 cm, image 55/series 2. Previously this measured 1.8 x 1.7 cm. No new lesions identified. The stomach and small bowel loops appear within normal limits. Normal appearance of the proximal colon. Multiple sigmoid diverticula identified without acute inflammation. Review of the visualized bony structures shows scoliosis and multilevel degenerative disc disease within the lumbar spine.  IMPRESSION:  1. The left pelvic side wall lesion demonstrates mild increase in size from previous exam.  2. No new sites of new or progressive disease.  3. Cholelithiasis.  This was discussed and after discussion the plan was to continue with close followup. She therefore had a CT scan in September and comes in today with her husband to discuss these results.   Interval History:  COMPARISON: CT 11/02/2012  FINDINGS:  Lung bases are clear. No focal hepatic lesion. The gallbladder, pancreas, spleen, adrenal glands, and kidneys are normal. There are several gallstones within the lumen of the gallbladder. The stomach, small bowel, and cecum are normal. The diverticulum sigmoid colon. Rectum is normal. Abdominal aorta is normal in caliber. No retroperitoneal or periportal lymphadenopathy. No evidence of peritoneal disease. Within the pelvis, the left pelvic sidewall lobular enhancing mass which is slightly increased in size. The lesion is similar in axial dimension measuring 30 x 21 mm compared to the 29 x 22 mm on prior remeasured. In the craniocaudad dimension, lesion measures 26 mm 1ncreased from 21 mm on prior. No additional pelvic lymphadenopathy. Hysterectomy anatomy. Vaginal cuff appears normal. Bladder is normal.  Review of bone windows demonstrates no aggressive osseous lesions.  IMPRESSION:  1.  Interval small increase in volume of enhancing mass adjacent to the left pelvic sidewall.  2. No evidence of abdominal or pelvic lymphadenopathy.  Review of Systems  Constitutional:  Denies fever. Eating "too much" Skin: No rash, sores, jaundice, itching, or dryness.  Cardiovascular: No chest pain, shortness of breath, or edema  Pulmonary: No cough or wheeze.  Gastro Intestinal:  No nausea, vomiting, constipation, or diarrhea reported. No bright red blood per rectum or change in bowel movement.  Genitourinary: No frequency, urgency, or dysuria.  Denies vaginal bleeding and discharge.  Musculoskeletal: + "bursitis" right hip, not exercising  Neurologic: No weakness, numbness, or change in gait.  Psychology: No changes  Current Meds:  Outpatient Encounter Prescriptions as of 02/25/2013  Medication Sig Dispense Refill  . ALPHA-LIPOIC ACID 100 MG TABS Take 100 mg by mouth daily.        Marland Kitchen ALPRAZolam (XANAX) 0.25 MG tablet Take 0.25 mg by mouth as needed.       Marland Kitchen amLODipine (NORVASC) 10 MG tablet Take 10 mg by mouth daily.      Marland Kitchen aspirin 81 MG tablet Take 81 mg by mouth daily.        Marland Kitchen atenolol (TENORMIN) 25 MG tablet Take 25 mg by mouth 2 (two) times daily.       . Calcium Carbonate-Vitamin D (CALCIUM 600+D PO) Take 1 tablet by mouth 3 (three) times a week.        Marland Kitchen  cholecalciferol (VITAMIN D) 1000 UNITS tablet Take 1,000 Units by mouth daily.        . Coenzyme Q10 (CO Q10) 100 MG TABS Take 100 mg elemental calcium/kg/hr by mouth daily.        . fish oil-omega-3 fatty acids 1000 MG capsule Take 1 g by mouth daily.        Marland Kitchen ibuprofen (ADVIL,MOTRIN) 800 MG tablet Take 800 mg by mouth every 8 (eight) hours as needed for pain.      Marland Kitchen LORazepam (ATIVAN) 0.5 MG tablet TAKE 1 TABLET ORALLY OR UNDER THE TONGUE EVERY 4 HRS. AS NEEDED FOR NAUSEA  20 tablet  0  . Magnesium 250 MG TABS Take 250 mg by mouth 3 (three) times a week.        . ondansetron (ZOFRAN) 8 MG tablet TAKE 1 OR 2 TABLETS BY MOUTH  EVERY 12 HOURS AS NEEDED NAUSEA  30 tablet  1  . simvastatin (ZOCOR) 20 MG tablet Take 1 tablet (20 mg total) by mouth at bedtime.  90 tablet  3   No facility-administered encounter medications on file as of 02/25/2013.    Allergy:  Allergies  Allergen Reactions  . Ace Inhibitors Anaphylaxis  . Codeine Nausea Only  . Demerol Nausea Only    Social Hx:   History   Social History  . Marital Status: Married    Spouse Name: N/A    Number of Children: N/A  . Years of Education: N/A   Occupational History  . Not on file.   Social History Main Topics  . Smoking status: Former Smoker    Types: Cigarettes    Quit date: 05/13/1962  . Smokeless tobacco: Never Used  . Alcohol Use: 3.0 oz/week    5 Glasses of wine per week  . Drug Use: No  . Sexual Activity: No   Other Topics Concern  . Not on file   Social History Narrative  . No narrative on file    Past Surgical Hx:  Past Surgical History  Procedure Laterality Date  . Total abdominal hysterectomy w/ bilateral salpingoophorectomy    . Breast lumpectomy    . Appendectomy    . Tonsillectomy    . Foot surgery      RIGHT    Past Medical Hx:  Past Medical History  Diagnosis Date  . Hypertension   . Palpitations   . History of breast cancer 2000    right breast -- treated with lumpectomy and radiation therarpy, postoperatively with tamoxifen   . Endometrial cancer 12/2007    s/p total abdominal hysterectomy at [facility] - ovaries were also removed   . Hyperlipidemia   . Hypercholesterolemia   . PVC's (premature ventricular contractions)   . S/P radiation therapy 10/28/11-12/05/11    5040 cGy left pelvis  . S/P radiation therapy     Intracavitary brachytherapy of uterus    Oncology Hx:    Malignant neoplasm of corpus uteri, except isthmus   12/22/2007 Surgery Staging for IA UPSC    - 05/28/2008 Chemotherapy 6 cycles of paclitaxel and carboplatin with vaginal cuff brachytherapy   03/22/2011 Relapse/Recurrence Left  pelvic node recurrence   04/25/2011 Surgery L/S evaluation, not resectable.   05/16/2011 - 09/25/2011 Chemotherapy 6 cyclces paclitaxel and carboplatin    - 12/27/2011 Radiation Therapy radiation completed    Family Hx:  Family History  Problem Relation Age of Onset  . Thrombosis Father     coronary thrombosis  . Hypertension Father   .  Heart failure Mother   . Coronary artery disease Brother     Vitals:  Blood pressure 152/74, pulse 72, temperature 98.7 F (37.1 C), temperature source Oral, resp. rate 16, height 4\' 11"  (1.499 m), weight 110 lb 11.2 oz (50.213 kg).  Physical Exam:  Well-nourished well-developed female in no acute distress.   Neck: No lymphadenopathy no thyromegaly.   Lungs: Clear to auscultation bilaterally.   Cardiovascular: Regular rate and rhythm.   Abdomen: Soft, nontender, nondistended. There is no palpable masses or hepatosplenomegaly. There is no incisional hernias.   Groins: No lymphadenopathy.   Extremities: No edema.   Pelvic external genitalia within normal limits. Bimanual examination does reveal a smooth prominence of the left pelvic sidewall but no change compared to the three cm mass that was previously appreciated.   Assessment/Plan:  72 year old with recurrent uterine papillary serous carcinoma initially stage IB diagnosed in 2009. Most recent imaging in 01/2012 of this year reveals interval enlargement of her left pelvic sidewall mass consistent with nodal disease.   I have gone timeline for the patient and her husband about the different measurements of her lesion. While subtle, the changes have shown an increase since the initial decreases after her radiation therapy. I believe that the most prudent to proceed with a PET/CT. This is scheduled. I will call the patient with the results and will determine final disposition pending.  35 minutes face to face time in addition to her regular visit was done with the patient and her husband. I have  reviewed her prior CTs dating back to May of 2013 and retinal to 3-dimensional measurements of lesions. We also reviewed her most recent CT on the computer and person. They wanted to know what we would do should the PET scan light. Discuss with them that if they wanted to proceed with curative intent but that would require exploratory laparotomy with intent to resect this mass. We would need to have vascular surgery available as well as intraoperative radiation therapy set up. I believe would require resection of her left ureter with reimplantation and a possible bypass of her external iliac artery and vein. I discussed with them the very close proximity of this lesion to the obturator nerve in the past and the limitations with resecting in that area and if we will determine her from all of her sites and there was disease along the obturator nerve that she might be a candidate for intraoperative radiation. The other alternative would be chemotherapy. Her questions regarding potential surgery were discussed. They understand the surgery would have to be done at [facility]. They would be comfortable going there.   [name] A., MD 02/25/2013, 2:27 PM

## 2013-02-25 NOTE — Patient Instructions (Signed)
We will call you after your PET scan

## 2013-03-02 DIAGNOSIS — M545 Low back pain, unspecified: Secondary | ICD-10-CM | POA: Diagnosis not present

## 2013-03-02 DIAGNOSIS — M76899 Other specified enthesopathies of unspecified lower limb, excluding foot: Secondary | ICD-10-CM | POA: Diagnosis not present

## 2013-03-02 DIAGNOSIS — M5137 Other intervertebral disc degeneration, lumbosacral region: Secondary | ICD-10-CM | POA: Diagnosis not present

## 2013-03-02 DIAGNOSIS — M48061 Spinal stenosis, lumbar region without neurogenic claudication: Secondary | ICD-10-CM | POA: Diagnosis not present

## 2013-03-10 ENCOUNTER — Encounter (HOSPITAL_COMMUNITY): Payer: Self-pay

## 2013-03-10 ENCOUNTER — Encounter (HOSPITAL_COMMUNITY)
Admission: RE | Admit: 2013-03-10 | Discharge: 2013-03-10 | Disposition: A | Discharge status: Home / Self Care | Payer: Medicare Other | Source: Ambulatory Visit | Attending: Gynecologic Oncology | Admitting: Gynecologic Oncology

## 2013-03-10 DIAGNOSIS — R911 Solitary pulmonary nodule: Secondary | ICD-10-CM | POA: Insufficient documentation

## 2013-03-10 DIAGNOSIS — R1909 Other intra-abdominal and pelvic swelling, mass and lump: Secondary | ICD-10-CM | POA: Diagnosis not present

## 2013-03-10 DIAGNOSIS — C549 Malignant neoplasm of corpus uteri, unspecified: Secondary | ICD-10-CM | POA: Insufficient documentation

## 2013-03-10 DIAGNOSIS — Z90710 Acquired absence of both cervix and uterus: Secondary | ICD-10-CM | POA: Insufficient documentation

## 2013-03-10 DIAGNOSIS — C7800 Secondary malignant neoplasm of unspecified lung: Secondary | ICD-10-CM | POA: Diagnosis not present

## 2013-03-10 DIAGNOSIS — C541 Malignant neoplasm of endometrium: Secondary | ICD-10-CM

## 2013-03-10 LAB — GLUCOSE, CAPILLARY: Glucose-Capillary: 82 mg/dL (ref 70–99)

## 2013-03-10 MED ORDER — FLUDEOXYGLUCOSE F - 18 (FDG) INJECTION
18.3000 | Freq: Once | INTRAVENOUS | Status: AC | PRN
  Administered 2013-03-10: 18.3 via INTRAVENOUS

## 2013-03-11 ENCOUNTER — Telehealth: Payer: Self-pay | Admitting: Gynecologic Oncology

## 2013-03-11 NOTE — Telephone Encounter (Signed)
Spoke with patient. She agrees to surgery and we will have her see vascular surgery and radiation oncology for IORT. Will set that up for [facility]. She agrees. PG

## 2013-03-18 ENCOUNTER — Other Ambulatory Visit: Payer: Self-pay

## 2013-03-24 DIAGNOSIS — C549 Malignant neoplasm of corpus uteri, unspecified: Secondary | ICD-10-CM | POA: Diagnosis not present

## 2013-03-24 DIAGNOSIS — Z8540 Personal history of malignant neoplasm of unspecified female genital organ: Secondary | ICD-10-CM | POA: Diagnosis not present

## 2013-03-24 DIAGNOSIS — C55 Malignant neoplasm of uterus, part unspecified: Secondary | ICD-10-CM | POA: Diagnosis not present

## 2013-03-26 ENCOUNTER — Telehealth: Payer: Self-pay | Admitting: *Deleted

## 2013-03-26 NOTE — Telephone Encounter (Signed)
On 03-25-13 [employer] medical records to Jerrell Mylar at [facility] health care radiation oncology dept, it was consult notes, end of tx notes, dosimetry records

## 2013-03-31 DIAGNOSIS — E871 Hypo-osmolality and hyponatremia: Secondary | ICD-10-CM | POA: Diagnosis not present

## 2013-03-31 DIAGNOSIS — N135 Crossing vessel and stricture of ureter without hydronephrosis: Secondary | ICD-10-CM | POA: Diagnosis not present

## 2013-03-31 DIAGNOSIS — Z01810 Encounter for preprocedural cardiovascular examination: Secondary | ICD-10-CM | POA: Diagnosis not present

## 2013-03-31 DIAGNOSIS — IMO0002 Reserved for concepts with insufficient information to code with codable children: Secondary | ICD-10-CM | POA: Diagnosis not present

## 2013-03-31 DIAGNOSIS — E785 Hyperlipidemia, unspecified: Secondary | ICD-10-CM | POA: Diagnosis not present

## 2013-03-31 DIAGNOSIS — R339 Retention of urine, unspecified: Secondary | ICD-10-CM | POA: Diagnosis not present

## 2013-03-31 DIAGNOSIS — Z4682 Encounter for fitting and adjustment of non-vascular catheter: Secondary | ICD-10-CM | POA: Diagnosis not present

## 2013-03-31 DIAGNOSIS — I10 Essential (primary) hypertension: Secondary | ICD-10-CM | POA: Diagnosis present

## 2013-03-31 DIAGNOSIS — R310 Gross hematuria: Secondary | ICD-10-CM | POA: Diagnosis not present

## 2013-03-31 DIAGNOSIS — Z01818 Encounter for other preprocedural examination: Secondary | ICD-10-CM | POA: Diagnosis not present

## 2013-03-31 DIAGNOSIS — F411 Generalized anxiety disorder: Secondary | ICD-10-CM | POA: Diagnosis not present

## 2013-03-31 DIAGNOSIS — G8918 Other acute postprocedural pain: Secondary | ICD-10-CM | POA: Diagnosis not present

## 2013-03-31 DIAGNOSIS — C549 Malignant neoplasm of corpus uteri, unspecified: Secondary | ICD-10-CM | POA: Diagnosis not present

## 2013-03-31 DIAGNOSIS — C55 Malignant neoplasm of uterus, part unspecified: Secondary | ICD-10-CM | POA: Diagnosis not present

## 2013-03-31 DIAGNOSIS — Z853 Personal history of malignant neoplasm of breast: Secondary | ICD-10-CM | POA: Diagnosis not present

## 2013-03-31 DIAGNOSIS — Z8542 Personal history of malignant neoplasm of other parts of uterus: Secondary | ICD-10-CM | POA: Diagnosis not present

## 2013-03-31 DIAGNOSIS — R112 Nausea with vomiting, unspecified: Secondary | ICD-10-CM | POA: Diagnosis not present

## 2013-03-31 DIAGNOSIS — C772 Secondary and unspecified malignant neoplasm of intra-abdominal lymph nodes: Secondary | ICD-10-CM | POA: Diagnosis present

## 2013-04-06 ENCOUNTER — Other Ambulatory Visit: Payer: Medicare Other | Admitting: Lab

## 2013-04-06 ENCOUNTER — Other Ambulatory Visit: Payer: Self-pay | Admitting: Gynecologic Oncology

## 2013-04-06 DIAGNOSIS — C549 Malignant neoplasm of corpus uteri, unspecified: Secondary | ICD-10-CM

## 2013-04-06 DIAGNOSIS — E876 Hypokalemia: Secondary | ICD-10-CM

## 2013-04-07 ENCOUNTER — Telehealth: Payer: Self-pay | Admitting: Gynecologic Oncology

## 2013-04-07 ENCOUNTER — Ambulatory Visit (HOSPITAL_BASED_OUTPATIENT_CLINIC_OR_DEPARTMENT_OTHER): Payer: Medicare Other | Admitting: Lab

## 2013-04-07 DIAGNOSIS — C549 Malignant neoplasm of corpus uteri, unspecified: Secondary | ICD-10-CM

## 2013-04-07 DIAGNOSIS — E876 Hypokalemia: Secondary | ICD-10-CM

## 2013-04-07 LAB — BASIC METABOLIC PANEL (CC13)
Anion Gap: 11 mEq/L (ref 3–11)
BUN: 16.4 mg/dL (ref 7.0–26.0)
CO2: 26 mEq/L (ref 22–29)
Calcium: 9.5 mg/dL (ref 8.4–10.4)
Chloride: 101 mEq/L (ref 98–109)
Creatinine: 0.7 mg/dL (ref 0.6–1.1)
Glucose: 115 mg/dl (ref 70–140)
Potassium: 4.4 mEq/L (ref 3.5–5.1)
Sodium: 137 mEq/L (ref 136–145)

## 2013-04-07 NOTE — Telephone Encounter (Signed)
Patient notified of K+ level.  No concerns voiced.

## 2013-04-08 ENCOUNTER — Encounter (HOSPITAL_COMMUNITY): Payer: Self-pay | Admitting: Emergency Medicine

## 2013-04-08 ENCOUNTER — Emergency Department (HOSPITAL_COMMUNITY)
Admission: EM | Admit: 2013-04-08 | Discharge: 2013-04-08 | Disposition: A | Discharge status: Home / Self Care | Payer: Medicare Other | Attending: Emergency Medicine | Admitting: Emergency Medicine

## 2013-04-08 ENCOUNTER — Emergency Department (HOSPITAL_COMMUNITY): Payer: Medicare Other

## 2013-04-08 DIAGNOSIS — I4949 Other premature depolarization: Secondary | ICD-10-CM | POA: Diagnosis not present

## 2013-04-08 DIAGNOSIS — R9389 Abnormal findings on diagnostic imaging of other specified body structures: Secondary | ICD-10-CM | POA: Diagnosis not present

## 2013-04-08 DIAGNOSIS — Z7982 Long term (current) use of aspirin: Secondary | ICD-10-CM | POA: Diagnosis not present

## 2013-04-08 DIAGNOSIS — Z7901 Long term (current) use of anticoagulants: Secondary | ICD-10-CM | POA: Insufficient documentation

## 2013-04-08 DIAGNOSIS — R509 Fever, unspecified: Secondary | ICD-10-CM | POA: Diagnosis not present

## 2013-04-08 DIAGNOSIS — N390 Urinary tract infection, site not specified: Secondary | ICD-10-CM | POA: Insufficient documentation

## 2013-04-08 DIAGNOSIS — Z8542 Personal history of malignant neoplasm of other parts of uterus: Secondary | ICD-10-CM | POA: Diagnosis not present

## 2013-04-08 DIAGNOSIS — Z923 Personal history of irradiation: Secondary | ICD-10-CM | POA: Insufficient documentation

## 2013-04-08 DIAGNOSIS — Z853 Personal history of malignant neoplasm of breast: Secondary | ICD-10-CM | POA: Diagnosis not present

## 2013-04-08 DIAGNOSIS — E7800 Pure hypercholesterolemia, unspecified: Secondary | ICD-10-CM | POA: Diagnosis not present

## 2013-04-08 DIAGNOSIS — R7881 Bacteremia: Secondary | ICD-10-CM | POA: Diagnosis not present

## 2013-04-08 DIAGNOSIS — I10 Essential (primary) hypertension: Secondary | ICD-10-CM | POA: Insufficient documentation

## 2013-04-08 DIAGNOSIS — R110 Nausea: Secondary | ICD-10-CM | POA: Diagnosis not present

## 2013-04-08 DIAGNOSIS — E785 Hyperlipidemia, unspecified: Secondary | ICD-10-CM | POA: Diagnosis not present

## 2013-04-08 DIAGNOSIS — Z87891 Personal history of nicotine dependence: Secondary | ICD-10-CM | POA: Insufficient documentation

## 2013-04-08 DIAGNOSIS — Z79899 Other long term (current) drug therapy: Secondary | ICD-10-CM | POA: Insufficient documentation

## 2013-04-08 LAB — BASIC METABOLIC PANEL
BUN: 21 mg/dL (ref 6–23)
CO2: 23 mEq/L (ref 19–32)
Calcium: 9.3 mg/dL (ref 8.4–10.5)
Chloride: 97 mEq/L (ref 96–112)
Creatinine, Ser: 0.78 mg/dL (ref 0.50–1.10)
GFR calc Af Amer: >90 mL/min (ref >90)
GFR calc non Af Amer: 81 mL/min — ABNORMAL LOW (ref >90)
Glucose, Bld: 157 mg/dL — ABNORMAL HIGH (ref 70–99)
Potassium: 3.6 mEq/L (ref 3.5–5.1)
Sodium: 133 mEq/L — ABNORMAL LOW (ref 135–145)

## 2013-04-08 LAB — URINALYSIS, ROUTINE W REFLEX MICROSCOPIC
Bilirubin Urine: NEGATIVE
Glucose, UA: NEGATIVE mg/dL
Ketones, ur: 15 mg/dL — AB
Nitrite: POSITIVE — AB
Protein, ur: 100 mg/dL — AB
Specific Gravity, Urine: 1.019 (ref 1.005–1.030)
Urobilinogen, UA: 1 mg/dL (ref 0.0–1.0)
pH: 6 (ref 5.0–8.0)

## 2013-04-08 LAB — URINE MICROSCOPIC-ADD ON

## 2013-04-08 LAB — CBC
HCT: 28.6 % — ABNORMAL LOW (ref 36.0–46.0)
Hemoglobin: 10 g/dL — ABNORMAL LOW (ref 12.0–15.0)
MCH: 34.5 pg — ABNORMAL HIGH (ref 26.0–34.0)
MCHC: 35 g/dL (ref 30.0–36.0)
MCV: 98.6 fL (ref 78.0–100.0)
Platelets: 175 10*3/uL (ref 150–400)
RBC: 2.9 MIL/uL — ABNORMAL LOW (ref 3.87–5.11)
RDW: 13.5 % (ref 11.5–15.5)
WBC: 13.6 10*3/uL — ABNORMAL HIGH (ref 4.0–10.5)

## 2013-04-08 LAB — LACTIC ACID, PLASMA: Lactic Acid, Venous: 1.3 mmol/L (ref 0.5–2.2)

## 2013-04-08 MED ORDER — CEFPODOXIME PROXETIL 200 MG PO TABS
200.0000 mg | ORAL_TABLET | Freq: Once | ORAL | Status: AC
  Administered 2013-04-08: 200 mg via ORAL
  Filled 2013-04-08: qty 1

## 2013-04-08 MED ORDER — SODIUM CHLORIDE 0.9 % IV SOLN
Freq: Once | INTRAVENOUS | Status: AC
  Administered 2013-04-08: 22:00:00 via INTRAVENOUS

## 2013-04-08 MED ORDER — CEFPODOXIME PROXETIL 100 MG PO TABS: 100.0000 mg | ORAL_TABLET | Freq: Two times a day (BID) | ORAL | Status: DC

## 2013-04-08 MED ORDER — ACETAMINOPHEN 325 MG PO TABS
650.0000 mg | ORAL_TABLET | Freq: Once | ORAL | Status: AC
  Administered 2013-04-08: 650 mg via ORAL
  Filled 2013-04-08: qty 2

## 2013-04-08 MED ORDER — VANCOMYCIN HCL IN DEXTROSE 1-5 GM/200ML-% IV SOLN
1000.0000 mg | Freq: Once | INTRAVENOUS | Status: DC
  Filled 2013-04-08: qty 200

## 2013-04-08 MED ORDER — PIPERACILLIN-TAZOBACTAM 3.375 G IVPB 30 MIN
3.3750 g | Freq: Once | INTRAVENOUS | Status: AC
  Administered 2013-04-08: 3.375 g via INTRAVENOUS
  Filled 2013-04-08 (×2): qty 50

## 2013-04-08 MED ORDER — SODIUM CHLORIDE 0.9 % IV BOLUS (SEPSIS)
1000.0000 mL | Freq: Once | INTRAVENOUS | Status: AC
  Administered 2013-04-08: 1000 mL via INTRAVENOUS

## 2013-04-08 NOTE — ED Notes (Signed)
EDP [place] at bedside

## 2013-04-08 NOTE — ED Provider Notes (Signed)
CSN: [id number]     Arrival date & time 04/08/13  2030 History   First MD Initiated Contact with Patient 04/08/13 2039     Chief Complaint  Patient presents with  . Chills  . Fever   (Consider location/radiation/quality/duration/timing/severity/associated sxs/prior Treatment) Patient is a 72 y.o. female presenting with fever.  Fever Max temp prior to arrival:  102.3 Temp source:  Oral Severity:  Moderate Onset quality:  Sudden Timing:  Constant Progression:  Unchanged Chronicity:  New Relieved by:  Nothing Worsened by:  Nothing tried Associated symptoms: chills and nausea   Associated symptoms: no chest pain, no cough, no diarrhea, no rash, no rhinorrhea, no sore throat and no vomiting     Past Medical History  Diagnosis Date  . Hypertension   . Palpitations   . History of breast cancer 2000    right breast -- treated with lumpectomy and radiation therarpy, postoperatively with tamoxifen   . Endometrial cancer 12/2007    s/p total abdominal hysterectomy at [facility] - ovaries were also removed   . Hyperlipidemia   . Hypercholesterolemia   . PVC's (premature ventricular contractions)   . S/P radiation therapy 10/28/11-12/05/11    5040 cGy left pelvis  . S/P radiation therapy     Intracavitary brachytherapy of uterus   Past Surgical History  Procedure Laterality Date  . Total abdominal hysterectomy w/ bilateral salpingoophorectomy    . Breast lumpectomy    . Appendectomy    . Tonsillectomy    . Foot surgery      RIGHT   Family History  Problem Relation Age of Onset  . Thrombosis Father     coronary thrombosis  . Hypertension Father   . Heart failure Mother   . Coronary artery disease Brother    History  Substance Use Topics  . Smoking status: Former Smoker    Types: Cigarettes    Quit date: 05/13/1962  . Smokeless tobacco: Never Used  . Alcohol Use: 3.0 oz/week    5 Glasses of wine per week   OB History   Grav Para Term Preterm Abortions TAB SAB Ect  Mult Living                 Review of Systems  Constitutional: Positive for fever and chills.  HENT: Negative for postnasal drip, rhinorrhea and sore throat.   Respiratory: Negative for cough and shortness of breath.   Cardiovascular: Negative for chest pain and leg swelling.  Gastrointestinal: Positive for nausea. Negative for vomiting, abdominal pain and diarrhea.  Skin: Negative for rash.  All other systems reviewed and are negative.    Allergies  Ace inhibitors; Codeine; and Demerol  Home Medications   Current Outpatient Rx  Name  Route  Sig  Dispense  Refill  . docusate sodium (STOOL SOFTENER) 100 MG capsule   Oral   Take 100 mg by mouth 2 (two) times daily as needed.         . enoxaparin (LOVENOX) 30 MG/0.3ML injection   Subcutaneous   Inject 30 mg into the skin.         Marland Kitchen oxyCODONE (OXY IR/ROXICODONE) 5 MG immediate release tablet   Oral   Take 5 mg by mouth.         . polyethylene glycol (MIRALAX / GLYCOLAX) packet   Oral   Take by mouth.         . potassium chloride (K-DUR) 10 MEQ tablet   Oral   Take 40 mEq by  mouth daily.         . ALPHA-LIPOIC ACID 100 MG TABS   Oral   Take 100 mg by mouth daily.           Marland Kitchen ALPRAZolam (XANAX) 0.25 MG tablet   Oral   Take 0.25 mg by mouth as needed.          Marland Kitchen amLODipine (NORVASC) 10 MG tablet   Oral   Take 10 mg by mouth daily.         Marland Kitchen aspirin 81 MG tablet   Oral   Take 81 mg by mouth daily.           Marland Kitchen atenolol (TENORMIN) 25 MG tablet   Oral   Take 25 mg by mouth 2 (two) times daily.          . Calcium Carbonate-Vitamin D (CALCIUM 600+D PO)   Oral   Take 1 tablet by mouth 3 (three) times a week.           . cholecalciferol (VITAMIN D) 1000 UNITS tablet   Oral   Take 1,000 Units by mouth daily.           . Coenzyme Q10 (CO Q10) 100 MG TABS   Oral   Take 100 mg elemental calcium/kg/hr by mouth daily.           . fish oil-omega-3 fatty acids 1000 MG capsule   Oral    Take 1 g by mouth daily.           Marland Kitchen ibuprofen (ADVIL,MOTRIN) 800 MG tablet   Oral   Take 800 mg by mouth every 8 (eight) hours as needed for pain.         Marland Kitchen LORazepam (ATIVAN) 0.5 MG tablet      TAKE 1 TABLET ORALLY OR UNDER THE TONGUE EVERY 4 HRS. AS NEEDED FOR NAUSEA   20 tablet   0   . Magnesium 250 MG TABS   Oral   Take 250 mg by mouth 3 (three) times a week.           . ondansetron (ZOFRAN) 8 MG tablet      TAKE 1 OR 2 TABLETS BY MOUTH EVERY 12 HOURS AS NEEDED NAUSEA   30 tablet   1   . simvastatin (ZOCOR) 20 MG tablet   Oral   Take 1 tablet (20 mg total) by mouth at bedtime.   90 tablet   3    BP 112/47  Pulse 119  Temp(Src) 102.3 F (39.1 C) (Rectal)  Resp 18  SpO2 98% Physical Exam  Nursing note and vitals reviewed. Constitutional: She is oriented to person, place, and time. She appears well-developed and well-nourished. No distress.  HENT:  Head: Normocephalic and atraumatic.  Eyes: EOM are normal. Pupils are equal, round, and reactive to light.  Neck: Normal range of motion. Neck supple.  Cardiovascular: Normal rate and regular rhythm.  Exam reveals no friction rub.   No murmur heard. Pulmonary/Chest: Effort normal and breath sounds normal. No respiratory distress. She has no wheezes. She has no rales.  Abdominal: Soft. She exhibits no distension. There is no tenderness. There is no rebound.  Midline surgical incision, c/d/i  Musculoskeletal: Normal range of motion. She exhibits no edema.  Neurological: She is alert and oriented to person, place, and time.  Skin: She is not diaphoretic.    ED Course  Procedures (including critical care time) Labs Review Labs Reviewed  URINALYSIS, ROUTINE W REFLEX  MICROSCOPIC - Abnormal; Notable for the following:    APPearance TURBID (*)    Hgb urine dipstick LARGE (*)    Ketones, ur 15 (*)    Protein, ur 100 (*)    Nitrite POSITIVE (*)    Leukocytes, UA LARGE (*)    All other components within normal  limits  CBC - Abnormal; Notable for the following:    WBC 13.6 (*)    RBC 2.90 (*)    Hemoglobin 10.0 (*)    HCT 28.6 (*)    MCH 34.5 (*)    All other components within normal limits  BASIC METABOLIC PANEL - Abnormal; Notable for the following:    Sodium 133 (*)    Glucose, Bld 157 (*)    GFR calc non Af Amer 81 (*)    All other components within normal limits  URINE MICROSCOPIC-ADD ON - Abnormal; Notable for the following:    Squamous Epithelial / LPF FEW (*)    Bacteria, UA MANY (*)    All other components within normal limits  CULTURE, BLOOD (ROUTINE X 2)  CULTURE, BLOOD (ROUTINE X 2)  URINE CULTURE  LACTIC ACID, PLASMA   Imaging Review No results found.  EKG Interpretation   None       MDM   1. UTI (urinary tract infection)   2. Fever    13F presents with chills. Recent ureteral stent removal and cancerous node removal in L pelvic wall at [facility] 6 days ago. No post-op antibiotics. Denies abdominal pain, chest pain, SOB. She thinks she is having bladder spasms; denies dysuria. Denies URI symptoms. Here febrile, 102.3 rectally, tachycardic in the high 110s. Normotensive. Abdomen benign. Well healing surgical incision, lungs clear. Will check labs. With SIRS criteria met in ED, broad spectrum antibiotics initiated.  Labs show acute UTI, leukocytosis. Vancomycin stopped. PO Vantin given. Tachycardia greatly improved. CXR normal. Patient doesn't want to stay in the hospital. With stable vitals, good family support, and no vomiting, I feel discharge is appropriate. She has f/u with her Gyn/Onc doc in 4 days. She can call her PCP tmw.  Stable for discharge.    Dagmar Hait, MD 04/08/13 2256

## 2013-04-08 NOTE — ED Notes (Signed)
Rectal Temp complete

## 2013-04-08 NOTE — ED Notes (Signed)
Pt had abdominal surgery last Friday in [facility], has been fine since the surgery, today she's had the chills and a fever.

## 2013-04-09 ENCOUNTER — Telehealth (HOSPITAL_COMMUNITY): Payer: Self-pay

## 2013-04-09 ENCOUNTER — Encounter (HOSPITAL_COMMUNITY): Payer: Self-pay | Admitting: Emergency Medicine

## 2013-04-09 ENCOUNTER — Emergency Department (HOSPITAL_COMMUNITY)
Admission: EM | Admit: 2013-04-09 | Discharge: 2013-04-09 | Disposition: A | Discharge status: Home / Self Care | Payer: Medicare Other | Attending: Emergency Medicine | Admitting: Emergency Medicine

## 2013-04-09 DIAGNOSIS — I10 Essential (primary) hypertension: Secondary | ICD-10-CM | POA: Insufficient documentation

## 2013-04-09 DIAGNOSIS — Z853 Personal history of malignant neoplasm of breast: Secondary | ICD-10-CM | POA: Insufficient documentation

## 2013-04-09 DIAGNOSIS — R6883 Chills (without fever): Secondary | ICD-10-CM | POA: Diagnosis not present

## 2013-04-09 DIAGNOSIS — Z87891 Personal history of nicotine dependence: Secondary | ICD-10-CM | POA: Diagnosis not present

## 2013-04-09 DIAGNOSIS — N390 Urinary tract infection, site not specified: Secondary | ICD-10-CM | POA: Diagnosis not present

## 2013-04-09 DIAGNOSIS — E785 Hyperlipidemia, unspecified: Secondary | ICD-10-CM | POA: Insufficient documentation

## 2013-04-09 DIAGNOSIS — Z8541 Personal history of malignant neoplasm of cervix uteri: Secondary | ICD-10-CM | POA: Insufficient documentation

## 2013-04-09 DIAGNOSIS — Z79899 Other long term (current) drug therapy: Secondary | ICD-10-CM | POA: Diagnosis not present

## 2013-04-09 DIAGNOSIS — Z923 Personal history of irradiation: Secondary | ICD-10-CM | POA: Diagnosis not present

## 2013-04-09 DIAGNOSIS — Z7901 Long term (current) use of anticoagulants: Secondary | ICD-10-CM | POA: Diagnosis not present

## 2013-04-09 DIAGNOSIS — R7881 Bacteremia: Secondary | ICD-10-CM

## 2013-04-09 DIAGNOSIS — E7800 Pure hypercholesterolemia, unspecified: Secondary | ICD-10-CM | POA: Insufficient documentation

## 2013-04-09 MED ORDER — PIPERACILLIN-TAZOBACTAM 3.375 G IVPB 30 MIN
3.3750 g | Freq: Once | INTRAVENOUS | Status: AC
  Administered 2013-04-09: 3.375 g via INTRAVENOUS
  Filled 2013-04-09: qty 50

## 2013-04-09 NOTE — ED Notes (Signed)
Results called from [facility]. Preliminary Blood Cx 1 of 4 bottles.  1 Aerobic bottle (+) for gram (-) rods .  Chart to MD for review.

## 2013-04-09 NOTE — ED Notes (Signed)
Patient ws seen in the ED for fever and chills. Patient had surgery a week ago. Patient states she was called and told to come back to the ED because of abnormal lab work.

## 2013-04-09 NOTE — ED Notes (Signed)
Chart reviewed by Dr Effie Shy "Have pt return to ED for reevaluation"  Spoke w/pt.  Informed of preliminary result and MD's request for pt's return.  Pt stated she would return later today.

## 2013-04-09 NOTE — ED Provider Notes (Signed)
CSN: [id number]     Arrival date & time 04/09/13  1555 History   First MD Initiated Contact with Patient 04/09/13 1604     Chief Complaint  Patient presents with  . abnormal labs    (Consider location/radiation/quality/duration/timing/severity/associated sxs/prior Treatment) The history is provided by the patient.   patient was sent in after positive blood cultures. She had one set with gram-negative rods. She was seen in the ED yesterday for chills. She was found to have a UTI and was given Zosyn and a prescription for Vantin. She's taken 1 Josephine 10. She had abdominal surgery at [facility] a week ago and had a left renal stent placed. She states she's had some bladder spasm. She states she had one episode of chills she was in the hospital yesterday. No nausea vomiting. Her abdominal pain is improving. No lightheadedness or dizziness. Past Medical History  Diagnosis Date  . Hypertension   . Palpitations   . History of breast cancer 2000    right breast -- treated with lumpectomy and radiation therarpy, postoperatively with tamoxifen   . Endometrial cancer 12/2007    s/p total abdominal hysterectomy at [facility] - ovaries were also removed   . Hyperlipidemia   . Hypercholesterolemia   . PVC's (premature ventricular contractions)   . S/P radiation therapy 10/28/11-12/05/11    5040 cGy left pelvis  . S/P radiation therapy     Intracavitary brachytherapy of uterus   Past Surgical History  Procedure Laterality Date  . Total abdominal hysterectomy w/ bilateral salpingoophorectomy    . Breast lumpectomy    . Appendectomy    . Tonsillectomy    . Foot surgery      RIGHT   Family History  Problem Relation Age of Onset  . Thrombosis Father     coronary thrombosis  . Hypertension Father   . Heart failure Mother   . Coronary artery disease Brother    History  Substance Use Topics  . Smoking status: Former Smoker    Types: Cigarettes    Quit date: 05/13/1962  . Smokeless  tobacco: Never Used  . Alcohol Use: 3.0 oz/week    5 Glasses of wine per week   OB History   Grav Para Term Preterm Abortions TAB SAB Ect Mult Living                 Review of Systems  Constitutional: Positive for chills. Negative for activity change and appetite change.  Eyes: Negative for pain.  Respiratory: Negative for chest tightness and shortness of breath.   Cardiovascular: Negative for chest pain and leg swelling.  Gastrointestinal: Negative for nausea, vomiting, abdominal pain and diarrhea.  Genitourinary: Positive for dysuria. Negative for flank pain.  Musculoskeletal: Negative for back pain and neck stiffness.  Skin: Negative for rash.  Neurological: Negative for weakness, numbness and headaches.  Psychiatric/Behavioral: Negative for behavioral problems.    Allergies  Ace inhibitors; Codeine; Demerol; and Dilaudid  Home Medications   Current Outpatient Rx  Name  Route  Sig  Dispense  Refill  . acetaminophen (TYLENOL) 500 MG tablet   Oral   Take 500 mg by mouth every 6 (six) hours as needed.         . ALPHA-LIPOIC ACID 100 MG TABS   Oral   Take 100 mg by mouth daily.           Marland Kitchen ALPRAZolam (XANAX) 0.25 MG tablet   Oral   Take 0.25 mg by mouth as  needed for anxiety.          Marland Kitchen amLODipine (NORVASC) 10 MG tablet   Oral   Take 10 mg by mouth daily.         Marland Kitchen atenolol (TENORMIN) 25 MG tablet   Oral   Take 25 mg by mouth 2 (two) times daily.          . Calcium Carbonate-Vitamin D (CALCIUM 600+D PO)   Oral   Take 1 tablet by mouth 3 (three) times a week.           . cefpodoxime (VANTIN) 100 MG tablet   Oral   Take 1 tablet (100 mg total) by mouth 2 (two) times daily.   14 tablet   0   . cholecalciferol (VITAMIN D) 1000 UNITS tablet   Oral   Take 1,000 Units by mouth daily.           Marland Kitchen docusate sodium (STOOL SOFTENER) 100 MG capsule   Oral   Take 100 mg by mouth 2 (two) times daily as needed for mild constipation.          .  enoxaparin (LOVENOX) 30 MG/0.3ML injection   Subcutaneous   Inject 30 mg into the skin daily.          . fish oil-omega-3 fatty acids 1000 MG capsule   Oral   Take 1 g by mouth daily.           Marland Kitchen ibuprofen (ADVIL,MOTRIN) 800 MG tablet   Oral   Take 800 mg by mouth every 8 (eight) hours as needed for pain.         . Magnesium 250 MG TABS   Oral   Take 250 mg by mouth 3 (three) times a week.           Marland Kitchen oxyCODONE (OXY IR/ROXICODONE) 5 MG immediate release tablet   Oral   Take 5 mg by mouth every 6 (six) hours as needed for severe pain.          . simvastatin (ZOCOR) 20 MG tablet   Oral   Take 20 mg by mouth daily.          BP 117/62  Pulse 78  Temp(Src) 98.3 F (36.8 C) (Oral)  Resp 15  SpO2 96% Physical Exam  Nursing note and vitals reviewed. Constitutional: She is oriented to person, place, and time. She appears well-developed and well-nourished.  HENT:  Head: Normocephalic and atraumatic.  Neck: Normal range of motion. Neck supple.  Cardiovascular: Normal rate, regular rhythm and normal heart sounds.   No murmur heard. Pulmonary/Chest: Effort normal and breath sounds normal. No respiratory distress. She has no wheezes. She has no rales.  Abdominal: Soft. Bowel sounds are normal. She exhibits no distension. There is tenderness. There is no rebound and no guarding.  Mild tenderness to abdomen. Worse along the suture line and left lower abdomen. No rebound or guarding. No purulent drainage.  Genitourinary:  No CVA tenderness  Musculoskeletal: Normal range of motion.  Neurological: She is alert and oriented to person, place, and time. No cranial nerve deficit.  Skin: Skin is warm and dry.  Psychiatric: She has a normal mood and affect. Her speech is normal.    ED Course  Procedures (including critical care time) Labs Review Labs Reviewed - No data to display Imaging Review Dg Chest 2 View  04/08/2013   CLINICAL DATA:  Abdominal surgery at [facility]  on 04/02/2013. Now with fever of 102.  History of breast cancer and endometrial cancer.  EXAM: CHEST  2 VIEW  COMPARISON:  None.  FINDINGS: Surgical clips in the right axilla. Normal heart size and pulmonary vascularity. No focal airspace disease or consolidation in the lungs. No blunting of costophrenic angles. No pneumothorax. Present ureteral stent in the upper abdomen.  IMPRESSION: No evidence of active pulmonary disease.   Electronically Signed   By: Burman Nieves M.D.   On: 04/08/2013 21:57    EKG Interpretation   None       MDM   1. UTI (urinary tract infection)   2. Bacteremia    Patient called to return to the ED for positive blood cultures. Gram-negative rods. She also has gram-negative rods in her urine. She's been doing well since they were drawn yesterday. She is on Vantin and has had 1 dose of Zosyn. Discussed with her primary care, Dr. Timothy Lasso. She'll be given one dose of IV Zosyn here. She will followup with him on Monday and he will follow cultures. Any worsening of symptoms she will come back to the ED.    [name]. Rubin Payor, MD 04/09/13 [phone]

## 2013-04-11 ENCOUNTER — Encounter: Payer: Self-pay | Admitting: Gynecologic Oncology

## 2013-04-11 LAB — CULTURE, BLOOD (ROUTINE X 2)

## 2013-04-12 ENCOUNTER — Ambulatory Visit: Payer: Medicare Other | Admitting: Gynecologic Oncology

## 2013-04-12 DIAGNOSIS — I10 Essential (primary) hypertension: Secondary | ICD-10-CM | POA: Diagnosis not present

## 2013-04-12 DIAGNOSIS — IMO0002 Reserved for concepts with insufficient information to code with codable children: Secondary | ICD-10-CM | POA: Diagnosis not present

## 2013-04-12 DIAGNOSIS — R7881 Bacteremia: Secondary | ICD-10-CM | POA: Diagnosis not present

## 2013-04-12 DIAGNOSIS — N390 Urinary tract infection, site not specified: Secondary | ICD-10-CM | POA: Diagnosis not present

## 2013-04-12 LAB — URINE CULTURE
Colony Count: >=100000
Special Requests: NORMAL

## 2013-04-12 NOTE — ED Notes (Signed)
Chart appended per protocol MD.+urine

## 2013-04-13 ENCOUNTER — Telehealth (HOSPITAL_COMMUNITY): Payer: Self-pay | Admitting: Emergency Medicine

## 2013-04-13 NOTE — ED Notes (Signed)
Post ED Visit - Positive Culture Follow-up  Culture report reviewed by antimicrobial stewardship pharmacist: []  Wes Dulaney, Pharm.D., BCPS [x]  Celedonio Miyamoto, Pharm.D., BCPS []  Georgina Pillion, [address].D., BCPS []  [place], [address].D., BCPS, AAHIVP []  Estella Husk, Pharm.D., BCPS, AAHIVP  Positive urine culture Treated with Cefpodoxime, organism sensitive to the same and no further patient follow-up is required at this time.  Kylie A Holland 04/13/2013, 1:32 PM

## 2013-04-14 ENCOUNTER — Ambulatory Visit: Payer: Medicare Other | Attending: Gynecologic Oncology | Admitting: Gynecologic Oncology

## 2013-04-14 VITALS — BP 123/94 | HR 66 | Temp 98.4°F | Resp 16 | Ht 59.0 in | Wt 111.2 lb

## 2013-04-14 DIAGNOSIS — C549 Malignant neoplasm of corpus uteri, unspecified: Secondary | ICD-10-CM

## 2013-04-14 MED ORDER — FLUCONAZOLE 100 MG PO TABS: 100.0000 mg | ORAL_TABLET | Freq: Once | ORAL | Status: DC

## 2013-04-14 NOTE — Progress Notes (Signed)
Follow Up Note: Gyn-Onc  [name] 72 y.o. female  CC:  Chief Complaint  Patient presents with  . Suture / Staple Removal    Follow up    HPI:  [name] is seen for continuing attention to her recurrent endometrial carcinoma, post cycle 6 chemotherapy on 5/13. Cycle 5 was delayed a week due to counts, then taxol only given on 4-16 due to platelets of only 90K that day; she did have neulasta on 08-30-2011 (then enjoyed 2 weeks at beach). She then had radiation to persistent disease on the left pelvic side wall in 8/13.  History includes microinvasive T1N0 right breast cancer, which was ER/PR positive and HER-2 2+ with insufficient material for FISH at diagnosis in 03/1999, treated with lumpectomy with 2 sentinel node evaluation, local radiation and 5 years of Tamoxifen thru 05/2004, then aromatase inhibitor 07/2004 thru 04/2006.  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 She had 6 cycles of taxol/carboplatin through Jan 2010, and vaginal cuff brachytherapy. She did well until some vague LUQ symptoms in Nov. 2012, with CT AP in [facility] system Mar 22, 2011 showing left pelvic sidewall involvement. She was taken to laparoscopic evaluation 04-25-2011, with findings of dense fibrosis in the area as well as mass encasing obturator nerve and vein as well as internal iliac vasculature; left ureterolysis was performed. No pathology was submitted from that procedure. We proceeded with initial 3 additional cycles of taxol/ carboplatin (with carbo skin tests) from 05-16-2011 thru 06-28-2011, then repeated CT AP on 07-17-2011. The CT showed improvement in the left pelvic sidewall mass now 1.9 x 2 cm and 3.4 cm cephalocaudad, as compared with 2.5 x 2.2 cm and 5.4 cm on CT 03-22-11, and no progressive disease elsewhere. She saw Dr. Duard Brady after the CT, with recommendation for an  additonal 3 cycles of same chemotherapy then repeat scan. Cycle 4 was taxol + [place], with carbo skin test and neulasta, then cycle 5 as above only taxol.  She then completed radiation therapy under the care of Dr. Roselind Messier which she completed in August of 2013. She had a CT scan on March 23, 2012 that revealed:  IMPRESSION:  1. Today's study demonstrates a positive response to therapy with decreased size of left pelvic sidewall nodal masses, as detailed above. No new soft tissue masses or new lymphadenopathy is identified within the abdomen or pelvis. Continue attention on follow-up studies is recommended.  2. Cholelithiasis without findings to suggest acute cholecystitis.  3. Colonic diverticulosis without findings to suggest acute diverticulitis at this time.  4. Mild asymmetric urinary bladder wall thickening is unchanged compared to prior examinations and is nonspecific. No definite bladder wall mass is identified at this time.  5. Additional findings, similar to prior examinations, as above.  There have been another lymph node identified on her CT scan in may that measured 2.1 x 1.7 cm. In reviewing her CT report it states that the nodal mass higher up along the pelvic sidewalls is no longer clearly identified although there continues to be some amorphus soft tissue in the region with the previously noted nodal mass resided. This was confirmed with the reading radiologist and communicated to the patient.  CT 06/22/12:  Normal urinary bladder. Hysterectomy. Decreased size of the left pelvic peripherally enhancing lesion. This measures 1.8 x 1.7 cm  on image 55/series 2 versus 2.4 x 2.1  cm on the prior exam. No new lesions are identified. No significant free fluid.  Bones/Musculoskeletal: No acute osseous abnormality. Trace L4-L5 anterolisthesis is likely degenerative. There is S-shaped lumbar  spine curvature.  IMPRESSION:  1. Further decreased size of the left pelvic peripherally enhancing lesion.   2. No new sites of new or progressive disease.  3. Esophageal air fluid level suggests dysmotility or gastroesophageal reflux.  4. Cholelithiasis.  CT 11/02/2012:  The index lesion within the left pelvic side wall measures 2.2 x 2.7 cm, image 55/series 2. Previously this measured 1.8 x 1.7 cm. No new lesions identified. The stomach and small bowel loops appear within normal limits. Normal appearance of the proximal colon. Multiple sigmoid diverticula identified without acute inflammation. Review of the visualized bony structures shows scoliosis and multilevel degenerative disc disease within the lumbar spine.  IMPRESSION:  1. The left pelvic side wall lesion demonstrates mild increase in size from previous exam.  2. No new sites of new or progressive disease.  3. Cholelithiasis.  CT 01/28/13: FINDINGS:  Lung bases are clear. No focal hepatic lesion. The gallbladder, pancreas, spleen, adrenal glands, and kidneys are normal. There are several gallstones within the lumen of the gallbladder. The stomach, small bowel, and cecum are normal. The diverticulum sigmoid colon. Rectum is normal. Abdominal aorta is normal in caliber. No retroperitoneal or periportal lymphadenopathy. No evidence of peritoneal disease. Within the pelvis, the left pelvic sidewall lobular enhancing mass which is slightly increased in size. The lesion is similar in axial dimension measuring 30 x 21 mm compared to the 29 x 22 mm on prior remeasured. In the craniocaudad dimension, lesion measures 26 mm 1ncreased from 21 mm on prior. No additional pelvic lymphadenopathy. Hysterectomy anatomy. Vaginal cuff appears normal. Bladder is normal.  Review of bone windows demonstrates no aggressive osseous lesions.  IMPRESSION:  1. Interval small increase in volume of enhancing mass adjacent to the left pelvic sidewall.  2. No evidence of abdominal or pelvic lymphadenopathy. 72 year old with recurrent uterine papillary serous carcinoma initially  stage IB diagnosed in 2009. Most recent imaging in 01/2012 of this year reveals interval enlargement of her left pelvic sidewall mass consistent with nodal disease.  PET 03/10/13: 1. Hypermetabolic mass in the deep left pelvis consistent with metastasis.  2. No additional evidence of local metastasis. No evidence of distant metastasis.  3. Small right lower lobe pulmonary nodule is not hypermetabolic. Recommend attention on follow-up.  She underwent resection of pelvic mass, left sided ureterolysis, intra-operative radiation therapy by radiation oncology, omental J flap, and left ureteral stent placement by urology at [facility] with Dr. Cleda Mccreedy on 04/02/13.  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  Final pathology resulted:  Omentum, biopsy: Mature adipose tissue, fibrous tissue, calcifications and bland appearing gland consistent with endosalpingiosis.  No diagnostic carcinoma identified, pancytokeratin confirmatory (negative).  Lymph nodes, left obturator, regional resection: Aggregate of lymph node (4.1 cm) involved by poorly differentiated high grade carcinoma (see Light Microscopy).  Abundant tumor necrosis.   Interval History:  She presents today for post-operative follow up and staple removal.  She states that she has been improving post-operatively except for a recent urinary tract infection and bacteremia.  She presented to the Emergency Department on 04/08/13 with fever and chills.  Upon arrival, her temperature was 102.3.  She was also experiencing moderate bladder spasms.  She was diagnosed with an acute UTI and leukocytosis with her WBC count resulting 13.6.  She was given a dose of Zosyn and started on PO Vantin.  She returned to the emergency room on 04/09/13 after blood cultures resulted KLEBSIELLA  PNEUMONIAE.  Her urine culture also resulted ESCHERICHIA COLI and KLEBSIELLA PNEUMONIAE.  She was given another dose of IV Zosyn.  She states that she is to finish her PO antibiotics tomorrow evening and she has tolerated them well.  She is reporting anal itching and vulvar itching, which she relates to a possible yeast infection due to recent antibiotic use.  No vaginal discharge or bleeding reported.  She reports a decreased appetite but states she is able to tolerate food and is staying well hydrated.  She is to see Dr. Duard Brady at [facility] on December 8.  Minimal abdominal pain reported with adequate pain relief with use of Percocet sparingly.  Bowels and bladder working adequately.  She continues to report an occasional bladder spasm that is tolerable.  No other concerns voiced.   Review of Systems  Constitutional: Feels well.  Last reported low grade on Saturday evening.  No chills.  Decreased appetite post-op but able to eat and tolerate solid food Cardiovascular: No chest pain, shortness of breath, or edema.  Pulmonary: No cough or wheeze.  Gastrointestinal: No nausea, vomiting, or diarrhea. No bright red blood per rectum or change in bowel movement.  Genitourinary: No frequency, urgency, or dysuria. No vaginal bleeding or discharge.  Intermittent vaginal/anal itching.  Musculoskeletal: No myalgia or joint pain. Neurologic: No weakness, numbness, or change in gait.  Psychology: No depression, anxiety, or insomnia.  Current Meds:  Outpatient Encounter Prescriptions as of 04/14/2013  Medication Sig  . acetaminophen (TYLENOL) 500 MG tablet Take 500 mg by mouth every 6 (six) hours as needed.  . ALPHA-LIPOIC ACID 100 MG TABS Take 100 mg by mouth daily.    Marland Kitchen ALPRAZolam (XANAX) 0.25 MG tablet Take 0.25 mg by mouth as needed for anxiety.   Marland Kitchen amLODipine (NORVASC) 10 MG tablet Take 10 mg by mouth daily.  Marland Kitchen atenolol (TENORMIN) 25 MG tablet Take 25 mg by mouth 2 (two) times daily.   . Calcium  Carbonate-Vitamin D (CALCIUM 600+D PO) Take 1 tablet by mouth 3 (three) times a week.    . cefpodoxime (VANTIN) 100 MG tablet Take 1 tablet (100 mg total) by mouth 2 (two) times daily.  . cholecalciferol (VITAMIN D) 1000 UNITS tablet Take 1,000 Units by mouth daily.    Marland Kitchen docusate sodium (STOOL SOFTENER) 100 MG capsule Take 100 mg by mouth 2 (two) times daily as needed for mild constipation.   . enoxaparin (LOVENOX) 30 MG/0.3ML injection Inject 30 mg into the skin daily.   . fish oil-omega-3 fatty acids 1000 MG capsule Take 1 g by mouth daily.    Marland Kitchen ibuprofen (ADVIL,MOTRIN) 800 MG tablet Take 800 mg by mouth every 8 (eight) hours as needed for pain.  . Magnesium 250 MG TABS Take 250 mg by mouth 3 (three) times a week.    Marland Kitchen oxyCODONE (OXY IR/ROXICODONE) 5 MG immediate release tablet Take 5 mg by mouth every 6 (six) hours as needed for severe pain.   . simvastatin (ZOCOR) 20 MG tablet Take 20 mg by mouth daily.    Allergy:  Allergies  Allergen Reactions  . Ace Inhibitors Anaphylaxis  . Codeine Nausea Only  . Demerol Nausea Only  . Dilaudid [Hydromorphone  Hcl]     "total loss of her mind"    Social Hx:   History   Social History  . Marital Status: Married    Spouse Name: N/A    Number of Children: N/A  . Years of Education: N/A   Occupational History  . Not on file.   Social History Main Topics  . Smoking status: Former Smoker    Types: Cigarettes    Quit date: 05/13/1962  . Smokeless tobacco: Never Used  . Alcohol Use: 3.0 oz/week    5 Glasses of wine per week  . Drug Use: No  . Sexual Activity: No   Other Topics Concern  . Not on file   Social History Narrative  . No narrative on file    Past Surgical Hx:  Past Surgical History  Procedure Laterality Date  . Total abdominal hysterectomy w/ bilateral salpingoophorectomy    . Breast lumpectomy    . Appendectomy    . Tonsillectomy    . Foot surgery      RIGHT    Past Medical Hx:  Past Medical History   Diagnosis Date  . Hypertension   . Palpitations   . History of breast cancer 2000    right breast -- treated with lumpectomy and radiation therarpy, postoperatively with tamoxifen   . Endometrial cancer 12/2007    s/p total abdominal hysterectomy at [facility] - ovaries were also removed   . Hyperlipidemia   . Hypercholesterolemia   . PVC's (premature ventricular contractions)   . S/P radiation therapy 10/28/11-12/05/11    5040 cGy left pelvis  . S/P radiation therapy     Intracavitary brachytherapy of uterus    Family Hx:  Family History  Problem Relation Age of Onset  . Thrombosis Father     coronary thrombosis  . Hypertension Father   . Heart failure Mother   . Coronary artery disease Brother     Vitals:  Blood pressure 123/94, pulse 66, temperature 98.4 F (36.9 C), temperature source Oral, resp. rate 16, height 4\' 11"  (1.499 m), weight 111 lb 3.2 oz (50.44 kg).  Physical Exam:  General: Well developed, well nourished female in no acute distress. Alert and oriented x 3.  Neck: Supple without any enlargements.  Lymph node survey: No cervical, supraclavicular, or inguinal adenopathy.  Cardiovascular: Regular rate and rhythm. S1 and S2 normal.  Lungs: Clear to auscultation bilaterally. No wheezes/crackles/rhonchi noted.  Skin: No rashes or lesions present. Back: No CVA tenderness.  Abdomen: Abdomen soft, non-tender and non-obese. Active bowel sounds in all quadrants. 27 staples removed from the midline incision without difficulty.  No drainage noted.  Mild erythema noted around the staple insertion sites with no signs of infection.  1/2 steri strips applied.  Extremities: No bilateral cyanosis, edema, or clubbing.   Assessment/Plan:  72 year old with recurrent uterine papillary serous carcinoma initially stage IB diagnosed in 2009.  She is s/p resection of pelvic mass, left sided ureterolysis, intra-operative radiation therapy by radiation oncology, omental J flap, and left  ureteral stent placement by urology at [facility] with Dr. Cleda Mccreedy on 04/02/13.  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  Final pathology resulted:  Omentum, biopsy: Mature adipose tissue, fibrous tissue, calcifications and bland appearing gland consistent with endosalpingiosis.  No diagnostic carcinoma identified, pancytokeratin  confirmatory (negative).  Lymph nodes, left obturator, regional resection: Aggregate of lymph node (4.1 cm) involved by poorly differentiated high grade carcinoma (see Light Microscopy).  Abundant tumor necrosis.  Her abdominal incision is healing well with no signs of infection.  She is to complete her course of antibiotics tomorrow evening.  Reportable signs and symptoms reviewed.  She is to follow up with Dr. Duard Brady at [facility] on December 8 or sooner if needed.  She is advised that Dr. Duard Brady will be informed tomorrow during clinic of her recent episode with a UTI and bacteremia.  She is advised to call for any questions or concerns.  Incisional care reviewed.     CROSS, MELISSA DEAL, NP 04/14/2013, 3:48 PM

## 2013-04-14 NOTE — Patient Instructions (Signed)
Plan to follow up with Dr. Duard Brady as scheduled at [facility].

## 2013-04-15 LAB — CULTURE, BLOOD (ROUTINE X 2): Culture: NO GROWTH

## 2013-04-16 ENCOUNTER — Other Ambulatory Visit: Payer: Self-pay | Admitting: Gynecologic Oncology

## 2013-04-16 ENCOUNTER — Ambulatory Visit (HOSPITAL_BASED_OUTPATIENT_CLINIC_OR_DEPARTMENT_OTHER): Payer: Medicare Other | Admitting: Lab

## 2013-04-16 DIAGNOSIS — N390 Urinary tract infection, site not specified: Secondary | ICD-10-CM | POA: Diagnosis not present

## 2013-04-16 DIAGNOSIS — C549 Malignant neoplasm of corpus uteri, unspecified: Secondary | ICD-10-CM

## 2013-04-16 DIAGNOSIS — R7881 Bacteremia: Secondary | ICD-10-CM

## 2013-04-16 DIAGNOSIS — D649 Anemia, unspecified: Secondary | ICD-10-CM

## 2013-04-16 MED ORDER — CEFPODOXIME PROXETIL 100 MG PO TABS: 100.0000 mg | ORAL_TABLET | Freq: Two times a day (BID) | ORAL | Status: DC

## 2013-04-16 NOTE — Progress Notes (Signed)
Patient called reporting continued urinary symptoms with no fever or chills.  She states that "I dont feel like my UTI is cured."  She finished her medications yesterday.  Dr. Duard Brady notified.  Orders taken to repeat blood cultures and urine cultures.  Instructed to call for any concerns.

## 2013-04-19 DIAGNOSIS — C55 Malignant neoplasm of uterus, part unspecified: Secondary | ICD-10-CM | POA: Diagnosis not present

## 2013-04-21 ENCOUNTER — Telehealth: Payer: Self-pay | Admitting: Gynecologic Oncology

## 2013-04-21 DIAGNOSIS — N390 Urinary tract infection, site not specified: Secondary | ICD-10-CM

## 2013-04-21 LAB — URINE CULTURE

## 2013-04-21 MED ORDER — CIPROFLOXACIN HCL 250 MG PO TABS: 250.0000 mg | ORAL_TABLET | Freq: Two times a day (BID) | ORAL | Status: DC

## 2013-04-21 NOTE — Telephone Encounter (Signed)
Message left with urine culture results and Dr. Denman George recommendations to add Cipro to her current antibiotic regimen.  She is currently taking Vantin BID.  Instructed to call the office for any questions or concerns.

## 2013-04-22 ENCOUNTER — Telehealth: Payer: Self-pay | Admitting: Gynecologic Oncology

## 2013-04-22 LAB — CULTURE, BLOOD (SINGLE)

## 2013-04-22 NOTE — Telephone Encounter (Signed)
Patient informed of blood culture results.  No concerns voiced.  She is currently taking Cipro for UTI.  Instructed to call for any needs or concerns.

## 2013-04-28 ENCOUNTER — Encounter: Payer: Self-pay | Admitting: Gynecologic Oncology

## 2013-04-28 ENCOUNTER — Other Ambulatory Visit: Payer: Self-pay | Admitting: Gynecologic Oncology

## 2013-04-28 DIAGNOSIS — C541 Malignant neoplasm of endometrium: Secondary | ICD-10-CM

## 2013-04-28 DIAGNOSIS — N390 Urinary tract infection, site not specified: Secondary | ICD-10-CM

## 2013-04-30 ENCOUNTER — Other Ambulatory Visit: Payer: Medicare Other

## 2013-04-30 DIAGNOSIS — C541 Malignant neoplasm of endometrium: Secondary | ICD-10-CM

## 2013-04-30 DIAGNOSIS — N390 Urinary tract infection, site not specified: Secondary | ICD-10-CM

## 2013-04-30 DIAGNOSIS — C549 Malignant neoplasm of corpus uteri, unspecified: Secondary | ICD-10-CM | POA: Diagnosis not present

## 2013-05-01 LAB — URINE CULTURE

## 2013-05-03 ENCOUNTER — Encounter: Payer: Self-pay | Admitting: Gynecologic Oncology

## 2013-05-03 ENCOUNTER — Telehealth: Payer: Self-pay | Admitting: Gynecologic Oncology

## 2013-05-03 NOTE — Telephone Encounter (Signed)
Message left about urine culture results: no growth.  Instructed to call the office for any questions or concerns.

## 2013-05-10 DIAGNOSIS — N390 Urinary tract infection, site not specified: Secondary | ICD-10-CM | POA: Diagnosis not present

## 2013-05-10 DIAGNOSIS — B961 Klebsiella pneumoniae [K. pneumoniae] as the cause of diseases classified elsewhere: Secondary | ICD-10-CM | POA: Diagnosis not present

## 2013-05-11 ENCOUNTER — Emergency Department (HOSPITAL_COMMUNITY)
Admission: EM | Admit: 2013-05-11 | Discharge: 2013-05-11 | Disposition: A | Discharge status: Home / Self Care | Payer: Medicare Other | Attending: Emergency Medicine | Admitting: Emergency Medicine

## 2013-05-11 ENCOUNTER — Encounter (HOSPITAL_COMMUNITY): Payer: Self-pay | Admitting: Emergency Medicine

## 2013-05-11 DIAGNOSIS — Z87891 Personal history of nicotine dependence: Secondary | ICD-10-CM | POA: Diagnosis not present

## 2013-05-11 DIAGNOSIS — E785 Hyperlipidemia, unspecified: Secondary | ICD-10-CM | POA: Diagnosis not present

## 2013-05-11 DIAGNOSIS — Z923 Personal history of irradiation: Secondary | ICD-10-CM | POA: Diagnosis not present

## 2013-05-11 DIAGNOSIS — Z90710 Acquired absence of both cervix and uterus: Secondary | ICD-10-CM | POA: Insufficient documentation

## 2013-05-11 DIAGNOSIS — N390 Urinary tract infection, site not specified: Secondary | ICD-10-CM | POA: Diagnosis not present

## 2013-05-11 DIAGNOSIS — I4949 Other premature depolarization: Secondary | ICD-10-CM | POA: Diagnosis not present

## 2013-05-11 DIAGNOSIS — I10 Essential (primary) hypertension: Secondary | ICD-10-CM | POA: Diagnosis not present

## 2013-05-11 DIAGNOSIS — Z8542 Personal history of malignant neoplasm of other parts of uterus: Secondary | ICD-10-CM | POA: Diagnosis not present

## 2013-05-11 DIAGNOSIS — R509 Fever, unspecified: Secondary | ICD-10-CM | POA: Diagnosis not present

## 2013-05-11 DIAGNOSIS — Z853 Personal history of malignant neoplasm of breast: Secondary | ICD-10-CM | POA: Diagnosis not present

## 2013-05-11 DIAGNOSIS — Z79899 Other long term (current) drug therapy: Secondary | ICD-10-CM | POA: Insufficient documentation

## 2013-05-11 DIAGNOSIS — E7800 Pure hypercholesterolemia, unspecified: Secondary | ICD-10-CM | POA: Diagnosis not present

## 2013-05-11 LAB — COMPREHENSIVE METABOLIC PANEL
ALT: 12 U/L (ref 0–35)
AST: 19 U/L (ref 0–37)
Albumin: 3.4 g/dL — ABNORMAL LOW (ref 3.5–5.2)
Alkaline Phosphatase: 47 U/L (ref 39–117)
BUN: 18 mg/dL (ref 6–23)
CO2: 22 mEq/L (ref 19–32)
Calcium: 9.2 mg/dL (ref 8.4–10.5)
Chloride: 95 mEq/L — ABNORMAL LOW (ref 96–112)
Creatinine, Ser: 0.74 mg/dL (ref 0.50–1.10)
GFR calc Af Amer: >90 mL/min (ref >90)
GFR calc non Af Amer: 82 mL/min — ABNORMAL LOW (ref >90)
Glucose, Bld: 126 mg/dL — ABNORMAL HIGH (ref 70–99)
Potassium: 3 mEq/L — ABNORMAL LOW (ref 3.7–5.3)
Sodium: 135 mEq/L — ABNORMAL LOW (ref 137–147)
Total Bilirubin: 0.6 mg/dL (ref 0.3–1.2)
Total Protein: 7.1 g/dL (ref 6.0–8.3)

## 2013-05-11 LAB — URINALYSIS, ROUTINE W REFLEX MICROSCOPIC
Bilirubin Urine: NEGATIVE
Glucose, UA: NEGATIVE mg/dL
Ketones, ur: 40 mg/dL — AB
Nitrite: POSITIVE — AB
Protein, ur: 100 mg/dL — AB
Specific Gravity, Urine: 1.018 (ref 1.005–1.030)
Urobilinogen, UA: 0.2 mg/dL (ref 0.0–1.0)
pH: 6 (ref 5.0–8.0)

## 2013-05-11 LAB — CBC WITH DIFFERENTIAL/PLATELET
Basophils Absolute: 0 10*3/uL (ref 0.0–0.1)
Basophils Relative: 0 % (ref 0–1)
Eosinophils Absolute: 0 10*3/uL (ref 0.0–0.7)
Eosinophils Relative: 0 % (ref 0–5)
HCT: 32.7 % — ABNORMAL LOW (ref 36.0–46.0)
Hemoglobin: 10.7 g/dL — ABNORMAL LOW (ref 12.0–15.0)
Lymphocytes Relative: 5 % — ABNORMAL LOW (ref 12–46)
Lymphs Abs: 0.5 10*3/uL — ABNORMAL LOW (ref 0.7–4.0)
MCH: 32.8 pg (ref 26.0–34.0)
MCHC: 32.7 g/dL (ref 30.0–36.0)
MCV: 100.3 fL — ABNORMAL HIGH (ref 78.0–100.0)
Monocytes Absolute: 1.6 10*3/uL — ABNORMAL HIGH (ref 0.1–1.0)
Monocytes Relative: 15 % — ABNORMAL HIGH (ref 3–12)
Neutro Abs: 8.5 10*3/uL — ABNORMAL HIGH (ref 1.7–7.7)
Neutrophils Relative %: 80 % — ABNORMAL HIGH (ref 43–77)
Platelets: 200 10*3/uL (ref 150–400)
RBC: 3.26 MIL/uL — ABNORMAL LOW (ref 3.87–5.11)
RDW: 13.9 % (ref 11.5–15.5)
WBC: 10.6 10*3/uL — ABNORMAL HIGH (ref 4.0–10.5)

## 2013-05-11 LAB — URINE MICROSCOPIC-ADD ON

## 2013-05-11 LAB — CG4 I-STAT (LACTIC ACID): Lactic Acid, Venous: 0.81 mmol/L (ref 0.5–2.2)

## 2013-05-11 MED ORDER — CIPROFLOXACIN HCL 500 MG PO TABS: 500.0000 mg | ORAL_TABLET | Freq: Two times a day (BID) | ORAL | Status: DC

## 2013-05-11 MED ORDER — ACETAMINOPHEN 325 MG PO TABS
650.0000 mg | ORAL_TABLET | Freq: Once | ORAL | Status: AC
  Administered 2013-05-11: 650 mg via ORAL
  Filled 2013-05-11: qty 2

## 2013-05-11 MED ORDER — POTASSIUM CHLORIDE CRYS ER 20 MEQ PO TBCR
40.0000 meq | EXTENDED_RELEASE_TABLET | Freq: Once | ORAL | Status: AC
  Administered 2013-05-11: 40 meq via ORAL
  Filled 2013-05-11: qty 2

## 2013-05-11 MED ORDER — DEXTROSE 5 % IV SOLN
1.0000 g | INTRAVENOUS | Status: DC
  Administered 2013-05-11: 1 g via INTRAVENOUS
  Filled 2013-05-11: qty 10

## 2013-05-11 MED ORDER — SODIUM CHLORIDE 0.9 % IV SOLN: INTRAVENOUS | Status: DC

## 2013-05-11 MED ORDER — CIPROFLOXACIN HCL 500 MG PO TABS
500.0000 mg | ORAL_TABLET | Freq: Once | ORAL | Status: AC
  Administered 2013-05-11: 500 mg via ORAL
  Filled 2013-05-11: qty 1

## 2013-05-11 MED ORDER — SODIUM CHLORIDE 0.9 % IV BOLUS (SEPSIS)
1000.0000 mL | Freq: Once | INTRAVENOUS | Status: AC
  Administered 2013-05-11: 1000 mL via INTRAVENOUS

## 2013-05-11 NOTE — ED Provider Notes (Signed)
CSN: [id number]     Arrival date & time 05/11/13  1478 History   First MD Initiated Contact with Patient 05/11/13 1959     Chief Complaint  Patient presents with  . Urinary Frequency  . Fever    HPI The patient presents to emergency room with complaints of fever that started today. Patient has a history of endometrial cancer. She had excision of a mass adjacent to the left ureter in November of this year. During the procedure the patient had a ureteral stent placed. Patient went to [facility] yesterday for followup to have the stent removed. The patient had a urinalysis performed that suggested a urinary tract infection. The patient was not prescribed antibiotics and the stent was not removed because of the infeciton.  The urine was sent off for culture analysis. Patient was relatively asymptomatic yesterday but today she developed a fever. She has noted some urinary frequency and a change in the color of her urine. She denies any vomiting or diarrhea. She has not had any coughing or sore throat. She has not noticed any rashes. Her incision site does not seem to be having any drainage. Past Medical History  Diagnosis Date  . Hypertension   . Palpitations   . History of breast cancer 2000    right breast -- treated with lumpectomy and radiation therarpy, postoperatively with tamoxifen   . Endometrial cancer 12/2007    s/p total abdominal hysterectomy at [facility] - ovaries were also removed   . Hyperlipidemia   . Hypercholesterolemia   . PVC's (premature ventricular contractions)   . S/P radiation therapy 10/28/11-12/05/11    5040 cGy left pelvis  . S/P radiation therapy     Intracavitary brachytherapy of uterus   Past Surgical History  Procedure Laterality Date  . Total abdominal hysterectomy w/ bilateral salpingoophorectomy    . Breast lumpectomy    . Appendectomy    . Tonsillectomy    . Foot surgery      RIGHT   Family History  Problem Relation Age of Onset  . Thrombosis  Father     coronary thrombosis  . Hypertension Father   . Heart failure Mother   . Coronary artery disease Brother    History  Substance Use Topics  . Smoking status: Former Smoker    Types: Cigarettes    Quit date: 05/13/1962  . Smokeless tobacco: Never Used  . Alcohol Use: 3.0 oz/week    5 Glasses of wine per week   OB History   Grav Para Term Preterm Abortions TAB SAB Ect Mult Living                 Review of Systems  All other systems reviewed and are negative.    Allergies  Ace inhibitors; Codeine; Demerol; and Dilaudid  Home Medications   Current Outpatient Rx  Name  Route  Sig  Dispense  Refill  . ALPHA-LIPOIC ACID 100 MG TABS   Oral   Take 100 mg by mouth daily.           Marland Kitchen ALPRAZolam (XANAX) 0.25 MG tablet   Oral   Take 0.25 mg by mouth as needed for anxiety.          Marland Kitchen amLODipine (NORVASC) 10 MG tablet   Oral   Take 10 mg by mouth daily.         Marland Kitchen atenolol (TENORMIN) 25 MG tablet   Oral   Take 25 mg by mouth 2 (two) times  daily.          . Calcium Carbonate-Vitamin D (CALCIUM 600+D PO)   Oral   Take 1 tablet by mouth 3 (three) times a week.           . cholecalciferol (VITAMIN D) 1000 UNITS tablet   Oral   Take 1,000 Units by mouth daily.           . fish oil-omega-3 fatty acids 1000 MG capsule   Oral   Take 1 g by mouth daily.           Marland Kitchen ibuprofen (ADVIL,MOTRIN) 800 MG tablet   Oral   Take 800 mg by mouth every 8 (eight) hours as needed for pain.         . Magnesium 250 MG TABS   Oral   Take 250 mg by mouth 3 (three) times a week.           . simvastatin (ZOCOR) 20 MG tablet   Oral   Take 20 mg by mouth daily.         Marland Kitchen azithromycin (ZITHROMAX) 500 MG tablet   Oral   Take 500 mg by mouth daily. For 3 days.         Marland Kitchen enoxaparin (LOVENOX) 30 MG/0.3ML injection   Subcutaneous   Inject 30 mg into the skin daily.           BP 150/78  Pulse 99  Temp(Src) 102.8 F (39.3 C) (Oral)  Resp 18  SpO2  95% Physical Exam  Nursing note and vitals reviewed. Constitutional: She appears well-developed and well-nourished. No distress.  HENT:  Head: Normocephalic and atraumatic.  Right Ear: External ear normal.  Left Ear: External ear normal.  Eyes: Conjunctivae are normal. Right eye exhibits no discharge. Left eye exhibits no discharge. No scleral icterus.  Neck: Neck supple. No tracheal deviation present.  Cardiovascular: Normal rate, regular rhythm and intact distal pulses.   Pulmonary/Chest: Effort normal and breath sounds normal. No stridor. No respiratory distress. She has no wheezes. She has no rales.  Abdominal: Soft. Bowel sounds are normal. She exhibits no distension. There is no tenderness. There is no rebound and no guarding.  Well-healed abdominal scar without discharge or tenderness  Musculoskeletal: She exhibits no edema and no tenderness.  Neurological: She is alert. She has normal strength. No sensory deficit. Cranial nerve deficit:  no gross defecits noted. She exhibits normal muscle tone. She displays no seizure activity. Coordination normal.  Skin: Skin is warm and dry. No rash noted.  Psychiatric: She has a normal mood and affect.    ED Course  Procedures (including critical care time) Notes were reviewed from [facility] through [facility] care everywhere function.  Patient did have a dipstick urine yesterday that was positive for infection Labs Review Labs Reviewed  URINALYSIS, ROUTINE W REFLEX MICROSCOPIC - Abnormal; Notable for the following:    Color, Urine AMBER (*)    APPearance CLOUDY (*)    Hgb urine dipstick LARGE (*)    Ketones, ur 40 (*)    Protein, ur 100 (*)    Nitrite POSITIVE (*)    Leukocytes, UA LARGE (*)    All other components within normal limits  CBC WITH DIFFERENTIAL - Abnormal; Notable for the following:    WBC 10.6 (*)    RBC 3.26 (*)    Hemoglobin 10.7 (*)    HCT 32.7 (*)    MCV 100.3 (*)    Neutrophils Relative % 80 (*)  Neutro Abs 8.5  (*)    Lymphocytes Relative 5 (*)    Lymphs Abs 0.5 (*)    Monocytes Relative 15 (*)    Monocytes Absolute 1.6 (*)    All other components within normal limits  COMPREHENSIVE METABOLIC PANEL - Abnormal; Notable for the following:    Sodium 135 (*)    Potassium 3.0 (*)    Chloride 95 (*)    Glucose, Bld 126 (*)    Albumin 3.4 (*)    GFR calc non Af Amer 82 (*)    All other components within normal limits  URINE MICROSCOPIC-ADD ON - Abnormal; Notable for the following:    Bacteria, UA MANY (*)    All other components within normal limits  URINE CULTURE  URINE CULTURE  CG4 I-STAT (LACTIC ACID)   Imaging Review No results found.  EKG Interpretation   None       MDM   1. UTI (urinary tract infection)   2. Fever    Patient has a febrile UTI. She otherwise feels well. I discussed inpatient versus outpatient treatment. The patient would like to go home. Initially, I ordered an empiric dose of ceftriaxone IV. I reviewed the patient's urine cultures however in the last urinary tract infection she had was resistant to ceftriaxone. It was sensitive to Cipro. I will discharge the patient home on a course of ciprofloxacin. Patient understands to return to emergency room for worsening symptoms.  Additionally, I spoke with Dr Berneice Heinrich , whoI did not ask to see the patient , about whether emergent removal of the stent would be necessary.  Treatment of the infection first is a reasonable approach.    Celene Kras, MD 05/11/13 [id number]

## 2013-05-11 NOTE — ED Notes (Signed)
Pt was dx with UTI yesterday in [facility], no meds were called in for her, she states she had a fever last night. Pt had a stent placed in Novemeber

## 2013-05-12 ENCOUNTER — Encounter: Payer: Self-pay | Admitting: Gynecologic Oncology

## 2013-05-14 ENCOUNTER — Encounter: Payer: Self-pay | Admitting: Gynecologic Oncology

## 2013-05-15 LAB — URINE CULTURE: Colony Count: >=100000

## 2013-05-16 ENCOUNTER — Encounter: Payer: Self-pay | Admitting: Gynecologic Oncology

## 2013-05-16 ENCOUNTER — Telehealth (HOSPITAL_COMMUNITY): Payer: Self-pay | Admitting: Emergency Medicine

## 2013-05-16 NOTE — ED Notes (Signed)
Post ED Visit - Positive Culture Follow-up  Culture report reviewed by antimicrobial stewardship pharmacist: [x]  Wes Dulaney, Pharm.D., BCPS []  Heide Guile, Pharm.D., BCPS []  Alycia Rossetti, Pharm.D., BCPS []  [place], Pharm.D., BCPS, AAHIVP []  Legrand Como, Pharm.D., BCPS, AAHIVP  Positive urine culture Treated with Cipro, organism sensitive to the same and no further patient follow-up is required at this time.  Myrna Blazer 05/16/2013, 5:46 PM

## 2013-05-17 ENCOUNTER — Other Ambulatory Visit: Payer: Self-pay | Admitting: Gynecologic Oncology

## 2013-05-17 DIAGNOSIS — C549 Malignant neoplasm of corpus uteri, unspecified: Secondary | ICD-10-CM

## 2013-05-17 DIAGNOSIS — N390 Urinary tract infection, site not specified: Secondary | ICD-10-CM

## 2013-05-17 NOTE — Progress Notes (Signed)
Spoke with the patient about Dr. Elenora Gamma recommendations for a repeat UA and culture tomorrow at the [facility].  She is to complete her prescription for Cipro and will begin a lower dose as prophylaxis until stent removal.  Verbalizing understanding.  Instructed to call for any needs or concerns.

## 2013-05-18 ENCOUNTER — Other Ambulatory Visit (HOSPITAL_BASED_OUTPATIENT_CLINIC_OR_DEPARTMENT_OTHER): Payer: Medicare Other

## 2013-05-18 ENCOUNTER — Other Ambulatory Visit: Payer: Self-pay | Admitting: Gynecologic Oncology

## 2013-05-18 DIAGNOSIS — N390 Urinary tract infection, site not specified: Secondary | ICD-10-CM

## 2013-05-18 DIAGNOSIS — C549 Malignant neoplasm of corpus uteri, unspecified: Secondary | ICD-10-CM

## 2013-05-18 LAB — URINALYSIS, MICROSCOPIC - CHCC
Bilirubin (Urine): NEGATIVE
Glucose: NEGATIVE mg/dL
Ketones: 15 mg/dL
Nitrite: NEGATIVE
Protein: <30 mg/dL
Specific Gravity, Urine: 1.02 (ref 1.003–1.035)
Urobilinogen, UR: 0.2 mg/dL (ref 0.2–1)
pH: 6 (ref 4.6–8.0)

## 2013-05-18 MED ORDER — NITROFURANTOIN MONOHYD MACRO 100 MG PO CAPS: 100.0000 mg | ORAL_CAPSULE | Freq: Every day | ORAL | Status: DC

## 2013-05-20 ENCOUNTER — Telehealth: Payer: Self-pay | Admitting: Gynecologic Oncology

## 2013-05-20 ENCOUNTER — Encounter: Payer: Self-pay | Admitting: Gynecologic Oncology

## 2013-05-20 DIAGNOSIS — Z5189 Encounter for other specified aftercare: Secondary | ICD-10-CM | POA: Diagnosis not present

## 2013-05-20 LAB — URINE CULTURE

## 2013-05-20 NOTE — Telephone Encounter (Signed)
Message left with urine culture results.  Instructed to call the office for any questions or concerns.

## 2013-06-01 DIAGNOSIS — M76899 Other specified enthesopathies of unspecified lower limb, excluding foot: Secondary | ICD-10-CM | POA: Diagnosis not present

## 2013-06-08 DIAGNOSIS — M76899 Other specified enthesopathies of unspecified lower limb, excluding foot: Secondary | ICD-10-CM | POA: Diagnosis not present

## 2013-06-08 DIAGNOSIS — M5137 Other intervertebral disc degeneration, lumbosacral region: Secondary | ICD-10-CM | POA: Diagnosis not present

## 2013-06-08 DIAGNOSIS — M545 Low back pain, unspecified: Secondary | ICD-10-CM | POA: Diagnosis not present

## 2013-06-08 DIAGNOSIS — M48061 Spinal stenosis, lumbar region without neurogenic claudication: Secondary | ICD-10-CM | POA: Diagnosis not present

## 2013-06-29 DIAGNOSIS — M545 Low back pain, unspecified: Secondary | ICD-10-CM | POA: Diagnosis not present

## 2013-07-01 DIAGNOSIS — M545 Low back pain, unspecified: Secondary | ICD-10-CM | POA: Diagnosis not present

## 2013-07-02 DIAGNOSIS — M545 Low back pain, unspecified: Secondary | ICD-10-CM | POA: Diagnosis not present

## 2013-07-06 DIAGNOSIS — M545 Low back pain, unspecified: Secondary | ICD-10-CM | POA: Diagnosis not present

## 2013-07-13 ENCOUNTER — Encounter: Payer: Self-pay | Admitting: Gynecologic Oncology

## 2013-07-13 DIAGNOSIS — M545 Low back pain, unspecified: Secondary | ICD-10-CM | POA: Diagnosis not present

## 2013-07-15 DIAGNOSIS — M545 Low back pain, unspecified: Secondary | ICD-10-CM | POA: Diagnosis not present

## 2013-07-20 DIAGNOSIS — M545 Low back pain, unspecified: Secondary | ICD-10-CM | POA: Diagnosis not present

## 2013-07-22 ENCOUNTER — Other Ambulatory Visit: Payer: Self-pay | Admitting: Gynecologic Oncology

## 2013-07-22 DIAGNOSIS — M545 Low back pain, unspecified: Secondary | ICD-10-CM | POA: Diagnosis not present

## 2013-07-22 DIAGNOSIS — C541 Malignant neoplasm of endometrium: Secondary | ICD-10-CM

## 2013-07-22 DIAGNOSIS — C549 Malignant neoplasm of corpus uteri, unspecified: Secondary | ICD-10-CM

## 2013-07-23 ENCOUNTER — Other Ambulatory Visit: Payer: Medicare Other

## 2013-07-23 DIAGNOSIS — C549 Malignant neoplasm of corpus uteri, unspecified: Secondary | ICD-10-CM

## 2013-07-23 LAB — BUN AND CREATININE (CC13)
BUN: 21.7 mg/dL (ref 7.0–26.0)
Creatinine: 0.8 mg/dL (ref 0.6–1.1)

## 2013-07-27 ENCOUNTER — Encounter (HOSPITAL_COMMUNITY): Payer: Self-pay

## 2013-07-27 ENCOUNTER — Ambulatory Visit (HOSPITAL_COMMUNITY)
Admission: RE | Admit: 2013-07-27 | Discharge: 2013-07-27 | Disposition: A | Discharge status: Home / Self Care | Payer: Medicare Other | Source: Ambulatory Visit | Attending: Gynecologic Oncology | Admitting: Gynecologic Oncology

## 2013-07-27 ENCOUNTER — Telehealth: Payer: Self-pay | Admitting: Gynecologic Oncology

## 2013-07-27 DIAGNOSIS — R935 Abnormal findings on diagnostic imaging of other abdominal regions, including retroperitoneum: Secondary | ICD-10-CM | POA: Diagnosis not present

## 2013-07-27 DIAGNOSIS — C541 Malignant neoplasm of endometrium: Secondary | ICD-10-CM

## 2013-07-27 DIAGNOSIS — C549 Malignant neoplasm of corpus uteri, unspecified: Secondary | ICD-10-CM | POA: Insufficient documentation

## 2013-07-27 MED ORDER — IOHEXOL 300 MG/ML  SOLN
80.0000 mL | Freq: Once | INTRAMUSCULAR | Status: AC | PRN
  Administered 2013-07-27: 80 mL via INTRAVENOUS

## 2013-07-27 NOTE — Telephone Encounter (Signed)
Error

## 2013-07-28 ENCOUNTER — Encounter: Payer: Self-pay | Admitting: Gynecologic Oncology

## 2013-07-28 ENCOUNTER — Ambulatory Visit: Payer: Medicare Other | Attending: Gynecologic Oncology | Admitting: Gynecologic Oncology

## 2013-07-28 VITALS — BP 157/78 | HR 71 | Temp 98.6°F | Resp 20 | Wt 110.5 lb

## 2013-07-28 DIAGNOSIS — Z87891 Personal history of nicotine dependence: Secondary | ICD-10-CM | POA: Diagnosis not present

## 2013-07-28 DIAGNOSIS — Z79899 Other long term (current) drug therapy: Secondary | ICD-10-CM | POA: Diagnosis not present

## 2013-07-28 DIAGNOSIS — C549 Malignant neoplasm of corpus uteri, unspecified: Secondary | ICD-10-CM | POA: Insufficient documentation

## 2013-07-28 DIAGNOSIS — I4949 Other premature depolarization: Secondary | ICD-10-CM | POA: Diagnosis not present

## 2013-07-28 DIAGNOSIS — Z853 Personal history of malignant neoplasm of breast: Secondary | ICD-10-CM | POA: Diagnosis not present

## 2013-07-28 DIAGNOSIS — E785 Hyperlipidemia, unspecified: Secondary | ICD-10-CM | POA: Insufficient documentation

## 2013-07-28 DIAGNOSIS — Z9221 Personal history of antineoplastic chemotherapy: Secondary | ICD-10-CM | POA: Diagnosis not present

## 2013-07-28 DIAGNOSIS — C541 Malignant neoplasm of endometrium: Secondary | ICD-10-CM

## 2013-07-28 DIAGNOSIS — I10 Essential (primary) hypertension: Secondary | ICD-10-CM | POA: Insufficient documentation

## 2013-07-28 DIAGNOSIS — E7800 Pure hypercholesterolemia, unspecified: Secondary | ICD-10-CM | POA: Diagnosis not present

## 2013-07-28 NOTE — Progress Notes (Signed)
Follow Up Note: Gyn-Onc  [name] 73 y.o. female  CC:  Chief Complaint  Patient presents with  . Endometrial Cancer    HPI:  [name] is seen for continuing attention to her recurrent endometrial carcinoma, post cycle 6 chemotherapy on 5/13. Cycle 5 was delayed a week due to counts, then taxol only given on 4-16 due to platelets of only 90K that day; she did have neulasta on 08-30-2011 (then enjoyed 2 weeks at beach). She then had radiation to persistent disease on the left pelvic side wall in 8/13.  History includes microinvasive T1N0 right breast cancer, which was ER/PR positive and HER-2 2+ with insufficient material for FISH at diagnosis in 03/1999, treated with lumpectomy with 2 sentinel node evaluation, local radiation and 5 years of Tamoxifen thru 05/2004, then aromatase inhibitor 07/2004 thru 04/2006.  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 She had 6 cycles of taxol/carboplatin through Jan 2010, and vaginal cuff brachytherapy. She did well until some vague LUQ symptoms in Nov. 2012, with CT AP in [facility] system Mar 22, 2011 showing left pelvic sidewall involvement. She was taken to laparoscopic evaluation 04-25-2011, with findings of dense fibrosis in the area as well as mass encasing obturator nerve and vein as well as internal iliac vasculature; left ureterolysis was performed. No pathology was submitted from that procedure. We proceeded with initial 3 additional cycles of taxol/ carboplatin (with carbo skin tests) from 05-16-2011 thru 06-28-2011, then repeated CT AP on 07-17-2011. The CT showed improvement in the left pelvic sidewall mass now 1.9 x 2 cm and 3.4 cm cephalocaudad, as compared with 2.5 x 2.2 cm and 5.4 cm on CT 03-22-11, and no progressive disease elsewhere. She saw Dr. Alycia Rossetti after the CT, with recommendation for an additonal 3 cycles of  same chemotherapy then repeat scan. Cycle 4 was taxol + [place], with carbo skin test and neulasta, then cycle 5 as above only taxol.  She then completed radiation therapy under the care of Dr. Sondra Come which she completed in August of 2013. She had a CT scan on March 23, 2012 that revealed:  IMPRESSION:  1. Today's study demonstrates a positive response to therapy with decreased size of left pelvic sidewall nodal masses, as detailed above. No new soft tissue masses or new lymphadenopathy is identified within the abdomen or pelvis. Continue attention on follow-up studies is recommended.  2. Cholelithiasis without findings to suggest acute cholecystitis.  3. Colonic diverticulosis without findings to suggest acute diverticulitis at this time.  4. Mild asymmetric urinary bladder wall thickening is unchanged compared to prior examinations and is nonspecific. No definite bladder wall mass is identified at this time.  5. Additional findings, similar to prior examinations, as above.  There have been another lymph node identified on her CT scan in may that measured 2.1 x 1.7 cm. In reviewing her CT report it states that the nodal mass higher up along the pelvic sidewalls is no longer clearly identified although there continues to be some amorphus soft tissue in the region with the previously noted nodal mass resided. This was confirmed with the reading radiologist and communicated to the patient.  CT 06/22/12:  Normal urinary bladder. Hysterectomy. Decreased size of the left pelvic peripherally enhancing lesion. This measures 1.8 x 1.7 cm  on image 55/series 2 versus 2.4 x 2.1 cm on the prior exam. No new  lesions are identified. No significant free fluid.  Bones/Musculoskeletal: No acute osseous abnormality. Trace L4-L5 anterolisthesis is likely degenerative. There is S-shaped lumbar  spine curvature.  IMPRESSION:  1. Further decreased size of the left pelvic peripherally enhancing lesion.  2. No new sites of  new or progressive disease.  3. Esophageal air fluid level suggests dysmotility or gastroesophageal reflux.  4. Cholelithiasis.  CT 11/02/2012:  The index lesion within the left pelvic side wall measures 2.2 x 2.7 cm, image 55/series 2. Previously this measured 1.8 x 1.7 cm. No new lesions identified. The stomach and small bowel loops appear within normal limits. Normal appearance of the proximal colon. Multiple sigmoid diverticula identified without acute inflammation. Review of the visualized bony structures shows scoliosis and multilevel degenerative disc disease within the lumbar spine.  IMPRESSION:  1. The left pelvic side wall lesion demonstrates mild increase in size from previous exam.  2. No new sites of new or progressive disease.  3. Cholelithiasis.  CT 01/28/13: FINDINGS:  Lung bases are clear. No focal hepatic lesion. The gallbladder, pancreas, spleen, adrenal glands, and kidneys are normal. There are several gallstones within the lumen of the gallbladder. The stomach, small bowel, and cecum are normal. The diverticulum sigmoid colon. Rectum is normal. Abdominal aorta is normal in caliber. No retroperitoneal or periportal lymphadenopathy. No evidence of peritoneal disease. Within the pelvis, the left pelvic sidewall lobular enhancing mass which is slightly increased in size. The lesion is similar in axial dimension measuring 30 x 21 mm compared to the 29 x 22 mm on prior remeasured. In the craniocaudad dimension, lesion measures 26 mm 1ncreased from 21 mm on prior. No additional pelvic lymphadenopathy. Hysterectomy anatomy. Vaginal cuff appears normal. Bladder is normal.  Review of bone windows demonstrates no aggressive osseous lesions.  IMPRESSION:  1. Interval small increase in volume of enhancing mass adjacent to the left pelvic sidewall.  2. No evidence of abdominal or pelvic lymphadenopathy. 73 year old with recurrent uterine papillary serous carcinoma initially stage IB diagnosed  in 2009. Most recent imaging in 01/2012 of this year reveals interval enlargement of her left pelvic sidewall mass consistent with nodal disease.  PET 03/10/13: 1. Hypermetabolic mass in the deep left pelvis consistent with metastasis.  2. No additional evidence of local metastasis. No evidence of distant metastasis.  3. Small right lower lobe pulmonary nodule is not hypermetabolic. Recommend attention on follow-up.  She underwent resection of pelvic mass, left sided ureterolysis, intra-operative radiation therapy by radiation oncology, omental J flap, and left ureteral stent placement by urology at [facility] with Dr. Nancy Marus on 04/02/13.  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  Final pathology resulted:  Omentum, biopsy: Mature adipose tissue, fibrous tissue, calcifications and bland appearing gland consistent with endosalpingiosis.  No diagnostic carcinoma identified, pancytokeratin confirmatory (negative).  Lymph nodes, left obturator, regional resection: Aggregate of lymph node (4.1 cm) involved by poorly differentiated high grade carcinoma (see Light Microscopy).  Abundant tumor necrosis.   Interval History:  She had her cystoscopy and ureteral stent removal on 05/21/2011. She is a CT scan March 17 that revealed no evidence of recurrent disease. She comes in today for interval followup.  She's overall feeling quite well. She has had a slight increase in some urinary incontinence after the surgery and start removal but she's not  sure if they related that she had a prior. She does were small pad during the day her energy level is increasing she feels about 80%. She is going to the gym and is on the treadmill. Her weight is steady she's gained a few pounds which she is relieved to see. She is doing physical therapy  for her back. She's had a few loose stools but is not sure if it's due to radiation, the food she's eating or her prior history of irritable bowel syndrome.  Review of Systems  Constitutional: Feels well.   Cardiovascular: No chest pain, shortness of breath, or edema.  Pulmonary: No cough or wheeze.  Gastrointestinal: No nausea, vomiting, or diarrhea. No bright red blood per rectum or change in bowel movement. Occasional loose stools Genitourinary: No frequency, urgency, or dysuria. No vaginal bleeding or discharge.  Occasional leakage  Musculoskeletal: No myalgia or joint pain. Neurologic: No weakness, numbness, or change in gait.  Psychology: No depression, anxiety, or insomnia.  Current Meds:  Outpatient Encounter Prescriptions as of 07/28/2013  Medication Sig  . ALPHA-LIPOIC ACID 100 MG TABS Take 100 mg by mouth daily.    Marland Kitchen ALPRAZolam (XANAX) 0.25 MG tablet Take 0.25 mg by mouth as needed for anxiety.   Marland Kitchen amLODipine (NORVASC) 10 MG tablet Take 10 mg by mouth daily.  Marland Kitchen atenolol (TENORMIN) 25 MG tablet Take 25 mg by mouth 2 (two) times daily.   Marland Kitchen azithromycin (ZITHROMAX) 500 MG tablet Take 500 mg by mouth daily. For 3 days.  . Calcium Carbonate-Vitamin D (CALCIUM 600+D PO) Take 1 tablet by mouth 3 (three) times a week.    . cholecalciferol (VITAMIN D) 1000 UNITS tablet Take 1,000 Units by mouth daily.    . ciprofloxacin (CIPRO) 500 MG tablet Take 1 tablet (500 mg total) by mouth every 12 (twelve) hours.  . enoxaparin (LOVENOX) 30 MG/0.3ML injection Inject 30 mg into the skin daily.   . fish oil-omega-3 fatty acids 1000 MG capsule Take 1 g by mouth daily.    Marland Kitchen ibuprofen (ADVIL,MOTRIN) 800 MG tablet Take 800 mg by mouth every 8 (eight) hours as needed for pain.  . Magnesium 250 MG TABS Take 250 mg by mouth 3 (three) times a week.    . nitrofurantoin, macrocrystal-monohydrate, (MACROBID) 100 MG capsule Take 1 capsule (100 mg total) by mouth at bedtime.  . simvastatin (ZOCOR) 20 MG tablet  Take 20 mg by mouth daily.    Allergy:  Allergies  Allergen Reactions  . Ace Inhibitors Anaphylaxis  . Codeine Nausea Only  . Demerol Nausea Only  . Dilaudid [Hydromorphone Hcl]     "total loss of her mind"    Social Hx:   History   Social History  . Marital Status: Married    Spouse Name: N/A    Number of Children: N/A  . Years of Education: N/A   Occupational History  . Not on file.   Social History Main Topics  . Smoking status: Former Smoker    Types: Cigarettes    Quit date: 05/13/1962  . Smokeless tobacco: Never Used  . Alcohol Use: 3.0 oz/week    5 Glasses of wine per week  . Drug Use: No  . Sexual Activity: No   Other Topics Concern  . Not on file   Social History Narrative  . No narrative on file    Past Surgical Hx:  Past Surgical History  Procedure Laterality Date  . Total abdominal hysterectomy w/ bilateral salpingoophorectomy    .  Breast lumpectomy    . Appendectomy    . Tonsillectomy    . Foot surgery      RIGHT    Past Medical Hx:  Past Medical History  Diagnosis Date  . Hypertension   . Palpitations   . History of breast cancer 2000    right breast -- treated with lumpectomy and radiation therarpy, postoperatively with tamoxifen   . Endometrial cancer 12/2007    s/p total abdominal hysterectomy at [facility] - ovaries were also removed   . Hyperlipidemia   . Hypercholesterolemia   . PVC's (premature ventricular contractions)   . S/P radiation therapy 10/28/11-12/05/11    5040 cGy left pelvis  . S/P radiation therapy     Intracavitary brachytherapy of uterus    Family Hx:  Family History  Problem Relation Age of Onset  . Thrombosis Father     coronary thrombosis  . Hypertension Father   . Heart failure Mother   . Coronary artery disease Brother     Vitals:  Blood pressure 157/78, pulse 71, temperature 98.6 F (37 C), temperature source Oral, resp. rate 20, weight 110 lb 8 oz (50.122 kg).  Physical Exam:  General: Well  developed, well nourished female in no acute distress. Alert and oriented x 3.   Neck: Supple without any enlargements.   Lymph node survey: No cervical, supraclavicular, or inguinal adenopathy.   Cardiovascular: Regular rate and rhythm.  Lungs: Clear to auscultation bilaterally. No wheezes/crackles/rhonchi noted.   Skin: No rashes or lesions present.  Back: No CVA tenderness.   Abdomen: Abdomen soft, non-tender and non-obese. Active bowel sounds in all quadrants.  Extremities: No bilateral cyanosis, edema, or clubbing.   Pelvic: External genitalia within normal limits. Vagina is atrophic. The vaginal cuff is without lesions. Bimanual examination reveals no masses or nodularity. Rectal confirms to  Assessment/Plan:  73 year old with recurrent uterine papillary serous carcinoma initially stage IB diagnosed in 2009.  She is s/p resection of pelvic mass, left sided ureterolysis, intra-operative radiation therapy by radiation oncology, omental J flap, and left ureteral stent placement by urology at [facility]  on 04/02/13.  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    CT scan now approximately 4 months after surgery shows no evidence of recurrent disease. She is very pleased. Her exam is negative. She return to see me in 3 months. Will most likely repeat a CT scan in 6 months.   [name] A.,  07/28/2013, 1:48 PM

## 2013-07-28 NOTE — Patient Instructions (Signed)
Followup with Dr. Alycia Rossetti in 3 months.

## 2013-08-03 DIAGNOSIS — M545 Low back pain, unspecified: Secondary | ICD-10-CM | POA: Diagnosis not present

## 2013-08-23 DIAGNOSIS — C55 Malignant neoplasm of uterus, part unspecified: Secondary | ICD-10-CM | POA: Diagnosis not present

## 2013-08-23 DIAGNOSIS — E785 Hyperlipidemia, unspecified: Secondary | ICD-10-CM | POA: Diagnosis not present

## 2013-08-23 DIAGNOSIS — M899 Disorder of bone, unspecified: Secondary | ICD-10-CM | POA: Diagnosis not present

## 2013-08-23 DIAGNOSIS — I10 Essential (primary) hypertension: Secondary | ICD-10-CM | POA: Diagnosis not present

## 2013-08-23 DIAGNOSIS — D649 Anemia, unspecified: Secondary | ICD-10-CM | POA: Diagnosis not present

## 2013-08-23 DIAGNOSIS — C50919 Malignant neoplasm of unspecified site of unspecified female breast: Secondary | ICD-10-CM | POA: Diagnosis not present

## 2013-08-23 DIAGNOSIS — M949 Disorder of cartilage, unspecified: Secondary | ICD-10-CM | POA: Diagnosis not present

## 2013-08-23 DIAGNOSIS — R82998 Other abnormal findings in urine: Secondary | ICD-10-CM | POA: Diagnosis not present

## 2013-08-25 DIAGNOSIS — Z1212 Encounter for screening for malignant neoplasm of rectum: Secondary | ICD-10-CM | POA: Diagnosis not present

## 2013-08-30 ENCOUNTER — Other Ambulatory Visit: Payer: Self-pay

## 2013-08-30 DIAGNOSIS — Z1231 Encounter for screening mammogram for malignant neoplasm of breast: Secondary | ICD-10-CM

## 2013-08-30 DIAGNOSIS — C55 Malignant neoplasm of uterus, part unspecified: Secondary | ICD-10-CM | POA: Diagnosis not present

## 2013-08-30 DIAGNOSIS — Z0000 Encounter for general adult medical examination without abnormal findings: Secondary | ICD-10-CM | POA: Diagnosis not present

## 2013-08-30 DIAGNOSIS — M899 Disorder of bone, unspecified: Secondary | ICD-10-CM | POA: Diagnosis not present

## 2013-08-30 DIAGNOSIS — D649 Anemia, unspecified: Secondary | ICD-10-CM | POA: Diagnosis not present

## 2013-08-30 DIAGNOSIS — Z23 Encounter for immunization: Secondary | ICD-10-CM | POA: Diagnosis not present

## 2013-08-30 DIAGNOSIS — M949 Disorder of cartilage, unspecified: Secondary | ICD-10-CM | POA: Diagnosis not present

## 2013-08-30 DIAGNOSIS — I10 Essential (primary) hypertension: Secondary | ICD-10-CM | POA: Diagnosis not present

## 2013-08-30 DIAGNOSIS — Z1331 Encounter for screening for depression: Secondary | ICD-10-CM | POA: Diagnosis not present

## 2013-08-30 DIAGNOSIS — E785 Hyperlipidemia, unspecified: Secondary | ICD-10-CM | POA: Diagnosis not present

## 2013-08-30 DIAGNOSIS — C50919 Malignant neoplasm of unspecified site of unspecified female breast: Secondary | ICD-10-CM | POA: Diagnosis not present

## 2013-08-30 DIAGNOSIS — R3129 Other microscopic hematuria: Secondary | ICD-10-CM | POA: Diagnosis not present

## 2013-09-08 DIAGNOSIS — M545 Low back pain, unspecified: Secondary | ICD-10-CM | POA: Diagnosis not present

## 2013-09-28 DIAGNOSIS — M4310 Spondylolisthesis, site unspecified: Secondary | ICD-10-CM | POA: Diagnosis not present

## 2013-09-28 DIAGNOSIS — M5137 Other intervertebral disc degeneration, lumbosacral region: Secondary | ICD-10-CM | POA: Diagnosis not present

## 2013-10-15 ENCOUNTER — Ambulatory Visit
Admission: RE | Admit: 2013-10-15 | Discharge: 2013-10-15 | Disposition: A | Discharge status: Home / Self Care | Payer: Medicare Other | Source: Ambulatory Visit

## 2013-10-15 DIAGNOSIS — Z1231 Encounter for screening mammogram for malignant neoplasm of breast: Secondary | ICD-10-CM

## 2013-12-06 DIAGNOSIS — H40019 Open angle with borderline findings, low risk, unspecified eye: Secondary | ICD-10-CM | POA: Diagnosis not present

## 2014-01-10 ENCOUNTER — Encounter: Payer: Self-pay | Admitting: Gynecologic Oncology

## 2014-01-11 ENCOUNTER — Encounter: Payer: Self-pay | Admitting: Gynecologic Oncology

## 2014-01-11 ENCOUNTER — Other Ambulatory Visit: Payer: Self-pay | Admitting: Gynecologic Oncology

## 2014-01-11 DIAGNOSIS — C549 Malignant neoplasm of corpus uteri, unspecified: Secondary | ICD-10-CM

## 2014-02-07 ENCOUNTER — Other Ambulatory Visit (HOSPITAL_BASED_OUTPATIENT_CLINIC_OR_DEPARTMENT_OTHER): Payer: Medicare Other

## 2014-02-07 DIAGNOSIS — C549 Malignant neoplasm of corpus uteri, unspecified: Secondary | ICD-10-CM

## 2014-02-07 LAB — BUN AND CREATININE (CC13)
BUN: 19.6 mg/dL (ref 7.0–26.0)
Creatinine: 0.8 mg/dL (ref 0.6–1.1)

## 2014-02-08 ENCOUNTER — Encounter (HOSPITAL_COMMUNITY): Payer: Self-pay

## 2014-02-08 ENCOUNTER — Ambulatory Visit (HOSPITAL_COMMUNITY)
Admission: RE | Admit: 2014-02-08 | Discharge: 2014-02-08 | Disposition: A | Discharge status: Home / Self Care | Payer: Medicare Other | Source: Ambulatory Visit | Attending: Gynecologic Oncology | Admitting: Gynecologic Oncology

## 2014-02-08 DIAGNOSIS — I700 Atherosclerosis of aorta: Secondary | ICD-10-CM | POA: Diagnosis not present

## 2014-02-08 DIAGNOSIS — Z90711 Acquired absence of uterus with remaining cervical stump: Secondary | ICD-10-CM | POA: Diagnosis not present

## 2014-02-08 DIAGNOSIS — C549 Malignant neoplasm of corpus uteri, unspecified: Secondary | ICD-10-CM | POA: Insufficient documentation

## 2014-02-08 MED ORDER — IOHEXOL 300 MG/ML  SOLN
80.0000 mL | Freq: Once | INTRAMUSCULAR | Status: AC | PRN
  Administered 2014-02-08: 80 mL via INTRAVENOUS

## 2014-02-10 ENCOUNTER — Telehealth: Payer: Self-pay | Admitting: Gynecologic Oncology

## 2014-02-10 NOTE — Telephone Encounter (Signed)
Patient informed of CT scan results.  No concerns voiced.  Advised to call for any needs.

## 2014-02-17 ENCOUNTER — Ambulatory Visit: Payer: Medicare Other | Attending: Gynecologic Oncology | Admitting: Gynecologic Oncology

## 2014-02-17 ENCOUNTER — Encounter: Payer: Self-pay | Admitting: Gynecologic Oncology

## 2014-02-17 VITALS — BP 156/79 | HR 68 | Temp 98.4°F | Resp 16 | Ht 59.0 in | Wt 111.1 lb

## 2014-02-17 DIAGNOSIS — E785 Hyperlipidemia, unspecified: Secondary | ICD-10-CM | POA: Insufficient documentation

## 2014-02-17 DIAGNOSIS — I10 Essential (primary) hypertension: Secondary | ICD-10-CM | POA: Insufficient documentation

## 2014-02-17 DIAGNOSIS — Z853 Personal history of malignant neoplasm of breast: Secondary | ICD-10-CM | POA: Insufficient documentation

## 2014-02-17 DIAGNOSIS — Z9221 Personal history of antineoplastic chemotherapy: Secondary | ICD-10-CM | POA: Insufficient documentation

## 2014-02-17 DIAGNOSIS — Z79899 Other long term (current) drug therapy: Secondary | ICD-10-CM | POA: Insufficient documentation

## 2014-02-17 DIAGNOSIS — Z90722 Acquired absence of ovaries, bilateral: Secondary | ICD-10-CM | POA: Diagnosis not present

## 2014-02-17 DIAGNOSIS — Z90710 Acquired absence of both cervix and uterus: Secondary | ICD-10-CM | POA: Insufficient documentation

## 2014-02-17 DIAGNOSIS — Z87891 Personal history of nicotine dependence: Secondary | ICD-10-CM | POA: Diagnosis not present

## 2014-02-17 DIAGNOSIS — Z923 Personal history of irradiation: Secondary | ICD-10-CM | POA: Diagnosis not present

## 2014-02-17 DIAGNOSIS — F1010 Alcohol abuse, uncomplicated: Secondary | ICD-10-CM | POA: Diagnosis not present

## 2014-02-17 DIAGNOSIS — C541 Malignant neoplasm of endometrium: Secondary | ICD-10-CM | POA: Insufficient documentation

## 2014-02-17 DIAGNOSIS — N135 Crossing vessel and stricture of ureter without hydronephrosis: Secondary | ICD-10-CM | POA: Insufficient documentation

## 2014-02-17 NOTE — Progress Notes (Signed)
Follow Up Note: Gyn-Onc  [name] 73 y.o. female  CC:  Chief Complaint  Patient presents with  . endometrial cancer    HPI:  [name] is seen for continuing attention to her recurrent endometrial carcinoma, post cycle 6 chemotherapy on 5/13. Cycle 5 was delayed a week due to counts, then taxol only given on 4-16 due to platelets of only 90K that day; she did have neulasta on 08-30-2011 (then enjoyed 2 weeks at beach). She then had radiation to persistent disease on the left pelvic side wall in 8/13.  History includes microinvasive T1N0 right breast cancer, which was ER/PR positive and HER-2 2+ with insufficient material for FISH at diagnosis in 03/1999, treated with lumpectomy with 2 sentinel node evaluation, local radiation and 5 years of Tamoxifen thru 05/2004, then aromatase inhibitor 07/2004 thru 04/2006.  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 She had 6 cycles of taxol/carboplatin through Jan 2010, and vaginal cuff brachytherapy. She did well until some vague LUQ symptoms in Nov. 2012, with CT AP in [facility] system Mar 22, 2011 showing left pelvic sidewall involvement. She was taken to laparoscopic evaluation 04-25-2011, with findings of dense fibrosis in the area as well as mass encasing obturator nerve and vein as well as internal iliac vasculature; left ureterolysis was performed. No pathology was submitted from that procedure. We proceeded with initial 3 additional cycles of taxol/ carboplatin (with carbo skin tests) from 05-16-2011 thru 06-28-2011, then repeated CT AP on 07-17-2011. The CT showed improvement in the left pelvic sidewall mass now 1.9 x 2 cm and 3.4 cm cephalocaudad, as compared with 2.5 x 2.2 cm and 5.4 cm on CT 03-22-11, and no progressive disease elsewhere. She saw me after the CT, with recommendation for an additonal 3 cycles of same  chemotherapy then repeat scan. Cycle 4 was taxol + [place], with carbo skin test and neulasta, then cycle 5 as above only taxol.  She then completed radiation therapy under the care of Dr. Sondra Come which she completed in August of 2013. She had a CT scan on March 23, 2012 that revealed:  IMPRESSION:  1. Today's study demonstrates a positive response to therapy with decreased size of left pelvic sidewall nodal masses, as detailed above. No new soft tissue masses or new lymphadenopathy is identified within the abdomen or pelvis. Continue attention on follow-up studies is recommended.  2. Cholelithiasis without findings to suggest acute cholecystitis.  3. Colonic diverticulosis without findings to suggest acute diverticulitis at this time.  4. Mild asymmetric urinary bladder wall thickening is unchanged compared to prior examinations and is nonspecific. No definite bladder wall mass is identified at this time.  5. Additional findings, similar to prior examinations, as above.   There had been another lymph node identified on her CT scan in may that measured 2.1 x 1.7 cm. In reviewing her CT report it states that the nodal mass higher up along the pelvic sidewalls is no longer clearly identified although there continues to be some amorphus soft tissue in the region with the previously noted nodal mass resided. This was confirmed with the reading radiologist and communicated to the patient.  CT 06/22/12:  Normal urinary bladder. Hysterectomy. Decreased size of the left pelvic peripherally enhancing lesion. This measures 1.8 x 1.7 cm  on image 55/series 2 versus 2.4 x 2.1 cm on the prior exam. No new  lesions are identified. No significant free fluid.  Bones/Musculoskeletal: No acute osseous abnormality. Trace L4-L5 anterolisthesis is likely degenerative. There is S-shaped lumbar  spine curvature.  IMPRESSION:  1. Further decreased size of the left pelvic peripherally enhancing lesion.  2. No new sites of new  or progressive disease.  3. Esophageal air fluid level suggests dysmotility or gastroesophageal reflux.  4. Cholelithiasis.  CT 11/02/2012:  The index lesion within the left pelvic side wall measures 2.2 x 2.7 cm, image 55/series 2. Previously this measured 1.8 x 1.7 cm. No new lesions identified. The stomach and small bowel loops appear within normal limits. Normal appearance of the proximal colon. Multiple sigmoid diverticula identified without acute inflammation. Review of the visualized bony structures shows scoliosis and multilevel degenerative disc disease within the lumbar spine.  IMPRESSION:  1. The left pelvic side wall lesion demonstrates mild increase in size from previous exam.  2. No new sites of new or progressive disease.  3. Cholelithiasis.  CT 01/28/13: FINDINGS:  Lung bases are clear. No focal hepatic lesion. The gallbladder, pancreas, spleen, adrenal glands, and kidneys are normal. There are several gallstones within the lumen of the gallbladder. The stomach, small bowel, and cecum are normal. The diverticulum sigmoid colon. Rectum is normal. Abdominal aorta is normal in caliber. No retroperitoneal or periportal lymphadenopathy. No evidence of peritoneal disease. Within the pelvis, the left pelvic sidewall lobular enhancing mass which is slightly increased in size. The lesion is similar in axial dimension measuring 30 x 21 mm compared to the 29 x 22 mm on prior remeasured. In the craniocaudad dimension, lesion measures 26 mm 1ncreased from 21 mm on prior. No additional pelvic lymphadenopathy. Hysterectomy anatomy. Vaginal cuff appears normal. Bladder is normal.  Review of bone windows demonstrates no aggressive osseous lesions.  IMPRESSION:  1. Interval small increase in volume of enhancing mass adjacent to the left pelvic sidewall.  2. No evidence of abdominal or pelvic lymphadenopathy. 73 year old with recurrent uterine papillary serous carcinoma initially stage IB diagnosed in  2009. Most recent imaging in 01/2012 of this year reveals interval enlargement of her left pelvic sidewall mass consistent with nodal disease.  PET 03/10/13: 1. Hypermetabolic mass in the deep left pelvis consistent with metastasis.  2. No additional evidence of local metastasis. No evidence of distant metastasis.  3. Small right lower lobe pulmonary nodule is not hypermetabolic. Recommend attention on follow-up.  She underwent resection of pelvic mass, left sided ureterolysis, intra-operative radiation therapy by radiation oncology, omental J flap, and left ureteral stent placement by urology at [facility]  on 04/02/13.  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  Final pathology resulted:  Omentum, biopsy: Mature adipose tissue, fibrous tissue, calcifications and bland appearing gland consistent with endosalpingiosis.  No diagnostic carcinoma identified, pancytokeratin confirmatory (negative).  Lymph nodes, left obturator, regional resection: Aggregate of lymph node (4.1 cm) involved by poorly differentiated high grade carcinoma (see Light Microscopy).  Abundant tumor necrosis.   Interval History:  She had her cystoscopy and ureteral stent removal on 05/20/2013. She had a CT scan March 17 that revealed no evidence of recurrent disease. She comes in today for interval followup.  She had a CT scan performed on 02/05/14: IMPRESSION:  No CT findings for recurrent or metastatic disease involving the abdomen/ pelvis.   She is very  pleased by these results. She's overall feeling quite well. She's been traveling quite a bit. She goes to the gym every day and has been volunteering.  Review of Systems  Constitutional: Feels well.   Cardiovascular: No chest pain, shortness of breath, or edema.  Pulmonary: No cough or wheeze.   Gastrointestinal: No nausea, vomiting, or diarrhea. No bright red blood per rectum or change in bowel movement.  Genitourinary: No frequency, urgency, or dysuria. No vaginal bleeding or discharge.  Occasional leakage  Musculoskeletal: No myalgia or joint pain. Neurologic: No weakness, numbness.  Psychology: No changes  Current Meds:  Outpatient Encounter Prescriptions as of 02/17/2014  Medication Sig  . ALPRAZolam (XANAX) 0.25 MG tablet Take 0.25 mg by mouth as needed for anxiety.   Marland Kitchen amLODipine (NORVASC) 10 MG tablet Take 10 mg by mouth daily.  Marland Kitchen atenolol (TENORMIN) 25 MG tablet Take 25 mg by mouth 2 (two) times daily.   . fish oil-omega-3 fatty acids 1000 MG capsule Take 1 g by mouth daily.    Marland Kitchen ibuprofen (ADVIL,MOTRIN) 800 MG tablet Take 800 mg by mouth every 8 (eight) hours as needed for pain.  . Magnesium 250 MG TABS Take 250 mg by mouth 3 (three) times a week.    . simvastatin (ZOCOR) 20 MG tablet Take 20 mg by mouth daily.  . Calcium Carbonate-Vitamin D (CALCIUM 600+D PO) Take 1 tablet by mouth 3 (three) times a week.    . [DISCONTINUED] ALPHA-LIPOIC ACID 100 MG TABS Take 100 mg by mouth daily.    . [DISCONTINUED] azithromycin (ZITHROMAX) 500 MG tablet Take 500 mg by mouth daily. For 3 days.  . [DISCONTINUED] cholecalciferol (VITAMIN D) 1000 UNITS tablet Take 1,000 Units by mouth daily.    . [DISCONTINUED] ciprofloxacin (CIPRO) 500 MG tablet Take 1 tablet (500 mg total) by mouth every 12 (twelve) hours.  . [DISCONTINUED] enoxaparin (LOVENOX) 30 MG/0.3ML injection Inject 30 mg into the skin daily.   . [DISCONTINUED] nitrofurantoin, macrocrystal-monohydrate, (MACROBID) 100 MG capsule Take 1 capsule (100 mg total) by mouth at bedtime.    Allergy:  Allergies  Allergen Reactions  . Ace Inhibitors Anaphylaxis  . Codeine Nausea Only  . Demerol Nausea Only  . Dilaudid [Hydromorphone Hcl]     "total loss of her mind"    Social Hx:   History   Social History  . Marital Status:  Married    Spouse Name: N/A    Number of Children: N/A  . Years of Education: N/A   Occupational History  . Not on file.   Social History Main Topics  . Smoking status: Former Smoker    Types: Cigarettes    Quit date: 05/13/1962  . Smokeless tobacco: Never Used  . Alcohol Use: 3.0 oz/week    5 Glasses of wine per week  . Drug Use: No  . Sexual Activity: No   Other Topics Concern  . Not on file   Social History Narrative  . No narrative on file    Past Surgical Hx:  Past Surgical History  Procedure Laterality Date  . Total abdominal hysterectomy w/ bilateral salpingoophorectomy    . Breast lumpectomy    . Appendectomy    . Tonsillectomy    . Foot surgery      RIGHT    Past Medical Hx:  Past Medical History  Diagnosis Date  . Hypertension   . Palpitations   . History of breast cancer 2000    right breast -- treated with lumpectomy and  radiation therarpy, postoperatively with tamoxifen   . Hyperlipidemia   . Hypercholesterolemia   . PVC's (premature ventricular contractions)   . S/P radiation therapy 10/28/11-12/05/11    5040 cGy left pelvis  . S/P radiation therapy     Intracavitary brachytherapy of uterus  . Endometrial cancer 12/2007    s/p total abdominal hysterectomy at [facility] - ovaries were also removed     Family Hx:  Family History  Problem Relation Age of Onset  . Thrombosis Father     coronary thrombosis  . Hypertension Father   . Heart failure Mother   . Coronary artery disease Brother     Vitals:  Blood pressure 156/79, pulse 68, temperature 98.4 F (36.9 C), temperature source Oral, resp. rate 16, height _0  (1.499 m), weight 111 lb 1.6 oz (50.395 kg).  Physical Exam:  General: Well developed, well nourished female in no acute distress. Alert and oriented x 3.   Neck: Supple without any enlargements.   Lymph node survey: No cervical, supraclavicular, or inguinal adenopathy.   Cardiovascular: Regular rate and rhythm.  Lungs:  Clear to auscultation bilaterally.   Skin: No rashes or lesions present.  Back: No CVA tenderness.   Abdomen: Abdomen soft, non-tender and non-obese. Active bowel sounds in all quadrants. Well-healed vertical midline incision. No evidence of an incisional hernia.  Extremities: No bilateral cyanosis, edema, or clubbing.   Pelvic: External genitalia within normal limits. Vagina is atrophic. The vaginal cuff is without lesions. Bimanual examination reveals no masses or nodularity. Rectal confirms.  Assessment/Plan:  73 year old with recurrent uterine papillary serous carcinoma initially stage IB diagnosed in 2009.  She is s/p resection of pelvic mass, left sided ureterolysis, intra-operative radiation therapy by radiation oncology, omental J flap, and left ureteral stent placement by urology at [facility]  on 04/02/13.  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    CT scan now approximately 11 months after surgery shows no evidence of recurrent disease. She is very pleased. Her exam is negative. She return to see me in 3 months. We will repeat a CT scan in 6 months and in 12 months to complete 2 years of follow up.   Ibrohim Simmers A.,  02/17/2014, 12:51 PM

## 2014-02-17 NOTE — Patient Instructions (Addendum)
Return to clinic in 3 months

## 2014-02-25 ENCOUNTER — Other Ambulatory Visit: Payer: Self-pay

## 2014-03-08 ENCOUNTER — Telehealth: Payer: Self-pay | Admitting: *Deleted

## 2014-03-08 NOTE — Telephone Encounter (Signed)
Pt called appt for jan given.

## 2014-05-17 DIAGNOSIS — M542 Cervicalgia: Secondary | ICD-10-CM | POA: Diagnosis not present

## 2014-05-17 DIAGNOSIS — M5032 Other cervical disc degeneration, mid-cervical region: Secondary | ICD-10-CM | POA: Diagnosis not present

## 2014-05-17 DIAGNOSIS — M5031 Other cervical disc degeneration,  high cervical region: Secondary | ICD-10-CM | POA: Diagnosis not present

## 2014-05-31 DIAGNOSIS — M5136 Other intervertebral disc degeneration, lumbar region: Secondary | ICD-10-CM | POA: Diagnosis not present

## 2014-06-09 ENCOUNTER — Encounter: Payer: Self-pay | Admitting: Gynecologic Oncology

## 2014-06-09 ENCOUNTER — Ambulatory Visit: Payer: Medicare Other | Attending: Gynecologic Oncology | Admitting: Gynecologic Oncology

## 2014-06-09 VITALS — BP 155/78 | HR 74 | Temp 99.6°F | Resp 18 | Ht 59.0 in | Wt 111.8 lb

## 2014-06-09 DIAGNOSIS — Z5111 Encounter for antineoplastic chemotherapy: Secondary | ICD-10-CM | POA: Insufficient documentation

## 2014-06-09 DIAGNOSIS — C541 Malignant neoplasm of endometrium: Secondary | ICD-10-CM

## 2014-06-09 DIAGNOSIS — K8020 Calculus of gallbladder without cholecystitis without obstruction: Secondary | ICD-10-CM | POA: Insufficient documentation

## 2014-06-09 DIAGNOSIS — K5730 Diverticulosis of large intestine without perforation or abscess without bleeding: Secondary | ICD-10-CM | POA: Diagnosis not present

## 2014-06-09 DIAGNOSIS — M549 Dorsalgia, unspecified: Secondary | ICD-10-CM | POA: Diagnosis not present

## 2014-06-09 NOTE — Patient Instructions (Addendum)
Return to clinic in 3 months. We'll have a CT scan done prior to your visit with me. You CT scan is scheduled for August 15, 2014 at 9:30 at [facility]. Please arrive at 9:15. Drink your 1st bottle of contrast at 7:30 that morning, 2nd bottle at 8:30. No food or other drink for 4 hours prior to scan (NPO at 5:30 am).

## 2014-06-09 NOTE — Progress Notes (Signed)
Follow Up Note: Gyn-Onc  [name] 74 y.o. female  CC:  Chief Complaint  Patient presents with  . Endometrial Ca    HPI:  [name] is seen for continuing attention to her recurrent endometrial carcinoma, post cycle 6 chemotherapy on 5/13. Cycle 5 was delayed a week due to counts, then taxol only given on 4-16 due to platelets of only 90K that day; she did have neulasta on 08-30-2011 (then enjoyed 2 weeks at beach). She then had radiation to persistent disease on the left pelvic side wall in 8/13.  History includes microinvasive T1N0 right breast cancer, which was ER/PR positive and HER-2 2+ with insufficient material for FISH at diagnosis in 03/1999, treated with lumpectomy with 2 sentinel node evaluation, local radiation and 5 years of Tamoxifen thru 05/2004, then aromatase inhibitor 07/2004 thru 04/2006.  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 She had 6 cycles of taxol/carboplatin through Jan 2010, and vaginal cuff brachytherapy. She did well until some vague LUQ symptoms in Nov. 2012, with CT AP in [facility] Mar 22, 2011 showing left pelvic sidewall involvement. She was taken to laparoscopic evaluation 04-25-2011, with findings of dense fibrosis in the area as well as mass encasing obturator nerve and vein as well as internal iliac vasculature; left ureterolysis was performed. No pathology was submitted from that procedure. We proceeded with initial 3 additional cycles of taxol/ carboplatin (with carbo skin tests) from 05-16-2011 thru 06-28-2011, then repeated CT AP on 07-17-2011. The CT showed improvement in the left pelvic sidewall mass now 1.9 x 2 cm and 3.4 cm cephalocaudad, as compared with 2.5 x 2.2 cm and 5.4 cm on CT 03-22-11, and no progressive disease elsewhere. She saw me after the CT, with recommendation for an additonal 3 cycles of same  chemotherapy then repeat scan. Cycle 4 was taxol + [place], with carbo skin test and neulasta, then cycle 5 as above only taxol.  She then completed radiation therapy under the care of Dr. Sondra Come which she completed in August of 2013. She had a CT scan on March 23, 2012 that revealed:  IMPRESSION:  1. Today's study demonstrates a positive response to therapy with decreased size of left pelvic sidewall nodal masses, as detailed above. No new soft tissue masses or new lymphadenopathy is identified within the abdomen or pelvis. Continue attention on follow-up studies is recommended.  2. Cholelithiasis without findings to suggest acute cholecystitis.  3. Colonic diverticulosis without findings to suggest acute diverticulitis at this time.  4. Mild asymmetric urinary bladder wall thickening is unchanged compared to prior examinations and is nonspecific. No definite bladder wall mass is identified at this time.  5. Additional findings, similar to prior examinations, as above.   There had been another lymph node identified on her CT scan in may that measured 2.1 x 1.7 cm. In reviewing her CT report it states that the nodal mass higher up along the pelvic sidewalls is no longer clearly identified although there continues to be some amorphus soft tissue in the region with the previously noted nodal mass resided. This was confirmed with the reading radiologist and communicated to the patient.  CT 06/22/12:  Normal urinary bladder. Hysterectomy. Decreased size of the left pelvic peripherally enhancing lesion. This measures 1.8 x 1.7 cm  on image 55/series 2 versus 2.4 x 2.1 cm on the prior exam. No new  lesions are identified. No significant free fluid.  Bones/Musculoskeletal: No acute osseous abnormality. Trace L4-L5 anterolisthesis is likely degenerative. There is S-shaped lumbar  spine curvature.  IMPRESSION:  1. Further decreased size of the left pelvic peripherally enhancing lesion.  2. No new sites of new  or progressive disease.  3. Esophageal air fluid level suggests dysmotility or gastroesophageal reflux.  4. Cholelithiasis.  CT 11/02/2012:  The index lesion within the left pelvic side wall measures 2.2 x 2.7 cm, image 55/series 2. Previously this measured 1.8 x 1.7 cm. No new lesions identified. The stomach and small bowel loops appear within normal limits. Normal appearance of the proximal colon. Multiple sigmoid diverticula identified without acute inflammation. Review of the visualized bony structures shows scoliosis and multilevel degenerative disc disease within the lumbar spine.  IMPRESSION:  1. The left pelvic side wall lesion demonstrates mild increase in size from previous exam.  2. No new sites of new or progressive disease.  3. Cholelithiasis.  CT 01/28/13: FINDINGS:  Lung bases are clear. No focal hepatic lesion. The gallbladder, pancreas, spleen, adrenal glands, and kidneys are normal. There are several gallstones within the lumen of the gallbladder. The stomach, small bowel, and cecum are normal. The diverticulum sigmoid colon. Rectum is normal. Abdominal aorta is normal in caliber. No retroperitoneal or periportal lymphadenopathy. No evidence of peritoneal disease. Within the pelvis, the left pelvic sidewall lobular enhancing mass which is slightly increased in size. The lesion is similar in axial dimension measuring 30 x 21 mm compared to the 29 x 22 mm on prior remeasured. In the craniocaudad dimension, lesion measures 26 mm 1ncreased from 21 mm on prior. No additional pelvic lymphadenopathy. Hysterectomy anatomy. Vaginal cuff appears normal. Bladder is normal.  Review of bone windows demonstrates no aggressive osseous lesions.  IMPRESSION:  1. Interval small increase in volume of enhancing mass adjacent to the left pelvic sidewall.  2. No evidence of abdominal or pelvic lymphadenopathy. 74 year old with recurrent uterine papillary serous carcinoma initially stage IB diagnosed in  2009. Most recent imaging in 01/2012 of this year reveals interval enlargement of her left pelvic sidewall mass consistent with nodal disease.  PET 03/10/13: 1. Hypermetabolic mass in the deep left pelvis consistent with metastasis.  2. No additional evidence of local metastasis. No evidence of distant metastasis.  3. Small right lower lobe pulmonary nodule is not hypermetabolic. Recommend attention on follow-up.  She underwent resection of pelvic mass, left sided ureterolysis, intra-operative radiation therapy by radiation oncology, omental J flap, and left ureteral stent placement by urology at [facility]  on 04/02/13.  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  Final pathology resulted:  Omentum, biopsy: Mature adipose tissue, fibrous tissue, calcifications and bland appearing gland consistent with endosalpingiosis.  No diagnostic carcinoma identified, pancytokeratin confirmatory (negative).  Lymph nodes, left obturator, regional resection: Aggregate of lymph node (4.1 cm) involved by poorly differentiated high grade carcinoma (see Light Microscopy).  Abundant tumor necrosis.   Interval History:  She had her cystoscopy and ureteral stent removal on 05/20/2013. She had a CT scan March 17 that revealed no evidence of recurrent disease. She comes in today for interval followup.  She had a CT scan performed on 02/05/14: IMPRESSION:  No CT findings for recurrent or metastatic disease involving the abdomen/ pelvis.  I last saw her  10/15.   She has been having increasing issues over the past 3-4 weeks with increasing back pain. She has been told she has DJD and in the past this pain has been relieved with stress dose steroids, but this time it has not helped. She is taking scheduled narcotics with +/- help. She has been in  contact with her ortho MD and has an MRI scheduled for 2/1. She also has an appt with a neurosurgeon in early March.   Review of Systems  Constitutional: Feels well except for the back pain  Cardiovascular: No chest pain, shortness of breath, or edema.  Pulmonary: No cough or wheeze.  Gastrointestinal: No nausea, vomiting, or diarrhea. No bright red blood per rectum or change in bowel movement.  Genitourinary: No frequency, urgency, or dysuria. No vaginal bleeding or discharge.  Occasional leakage  Musculoskeletal: + back pain, limiting mobility, heat does not help. + radiation > down left leg than right. Neurologic: + numbness occasionally along left lateral thigh and around left knee Psychology: No changes  Current Meds:  Outpatient Encounter Prescriptions as of 06/09/2014  Medication Sig  . amLODipine (NORVASC) 10 MG tablet Take 10 mg by mouth daily.  Marland Kitchen atenolol (TENORMIN) 25 MG tablet Take 25 mg by mouth 2 (two) times daily.   . Calcium Carbonate-Vitamin D (CALCIUM 600+D PO) Take 1 tablet by mouth 3 (three) times a week.    . fish oil-omega-3 fatty acids 1000 MG capsule Take 1 g by mouth daily.    Marland Kitchen gabapentin (NEURONTIN) 300 MG capsule   . Magnesium 250 MG TABS Take 250 mg by mouth 3 (three) times a week.    Marland Kitchen OVER THE COUNTER MEDICATION   . oxyCODONE-acetaminophen (PERCOCET) 10-325 MG per tablet   . potassium chloride SA (K-DUR,KLOR-CON) 20 MEQ tablet Take 20 mEq by mouth daily.  . simvastatin (ZOCOR) 20 MG tablet Take 20 mg by mouth daily.  Marland Kitchen ALPRAZolam (XANAX) 0.25 MG tablet Take 0.25 mg by mouth as needed for anxiety.   Marland Kitchen ibuprofen (ADVIL,MOTRIN) 800 MG tablet Take 800 mg by mouth every 8 (eight) hours as needed for pain.    Allergy:  Allergies  Allergen Reactions  . Ace Inhibitors Anaphylaxis  . Codeine Nausea Only  . Demerol Nausea Only  . Dilaudid [Hydromorphone Hcl]     "total loss of her mind"    Social Hx:   History   Social History  . Marital Status: Married     Spouse Name: N/A    Number of Children: N/A  . Years of Education: N/A   Occupational History  . Not on file.   Social History Main Topics  . Smoking status: Former Smoker    Types: Cigarettes    Quit date: 05/13/1962  . Smokeless tobacco: Never Used  . Alcohol Use: 3.0 oz/week    5 Glasses of wine per week  . Drug Use: No  . Sexual Activity: No   Other Topics Concern  . Not on file   Social History Narrative    Past Surgical Hx:  Past Surgical History  Procedure Laterality Date  . Total abdominal hysterectomy w/ bilateral salpingoophorectomy    . Breast lumpectomy    . Appendectomy    . Tonsillectomy    . Foot surgery      RIGHT    Past Medical Hx:  Past Medical History  Diagnosis Date  . Hypertension   . Palpitations   . History of breast cancer 2000    right  breast -- treated with lumpectomy and radiation therarpy, postoperatively with tamoxifen   . Hyperlipidemia   . Hypercholesterolemia   . PVC's (premature ventricular contractions)   . S/P radiation therapy 10/28/11-12/05/11    5040 cGy left pelvis  . S/P radiation therapy     Intracavitary brachytherapy of uterus  . Endometrial cancer 12/2007    s/p total abdominal hysterectomy at [facility] - ovaries were also removed     Family Hx:  Family History  Problem Relation Age of Onset  . Thrombosis Father     coronary thrombosis  . Hypertension Father   . Heart failure Mother   . Coronary artery disease Brother     Vitals:  Blood pressure 155/78, pulse 74, temperature 99.6 F (37.6 C), temperature source Oral, resp. rate 18, height _0  (1.499 m), weight 111 lb 12.8 oz (50.712 kg), SpO2 100 %.  Physical Exam:  General: Well developed, well nourished female in no acute distress. Alert and oriented x 3.   Neck: Supple without any enlargements.   Lymph node survey: No cervical, supraclavicular, or inguinal adenopathy.   Cardiovascular: Regular rate and rhythm.  Lungs: Clear to auscultation  bilaterally.   Skin: No rashes or lesions present.  Back: No CVA tenderness. No tenderness with percussion and firm palpation along spine, + left hip pain  Abdomen: Abdomen soft, non-tender and non-obese. Active bowel sounds in all quadrants. Well-healed vertical midline incision. No evidence of an incisional hernia.  Extremities: No bilateral cyanosis, edema, or clubbing.   Pelvic: External genitalia within normal limits. Vagina is atrophic. The vaginal cuff is without lesions. Bimanual examination reveals no masses or nodularity. Rectal confirms.  Assessment/Plan:  74 year old with recurrent uterine papillary serous carcinoma initially stage IB diagnosed in 2009.  She is s/p resection of pelvic mass, left sided ureterolysis, intra-operative radiation therapy by radiation oncology, omental J flap, and left ureteral stent placement by urology at [facility]  on 04/02/13.  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    We will get a CT scan when she returns to see me in 3 months.  I will follow up on the results of her MRI from Monday. I d/w her and her family that I do not believe that this is due to recurrent disease, however, that has to be in the differential. She is taking 10/325 every 5-6 hours with pain level of 4. I d/w her that she could increase the frequency to every 4 hours to see if this helps to decrease her pain. We can refill if she runs out before she speaks with her orthopedist next week.    [name] A.,  06/09/2014, 2:46 PM

## 2014-06-13 DIAGNOSIS — M4806 Spinal stenosis, lumbar region: Secondary | ICD-10-CM | POA: Diagnosis not present

## 2014-06-14 ENCOUNTER — Other Ambulatory Visit: Payer: Self-pay | Admitting: Gynecologic Oncology

## 2014-06-14 ENCOUNTER — Inpatient Hospital Stay
Admission: RE | Admit: 2014-06-14 | Discharge: 2014-06-14 | Disposition: A | Discharge status: Home / Self Care | Payer: Self-pay | Source: Ambulatory Visit | Attending: Gynecologic Oncology | Admitting: Gynecologic Oncology

## 2014-06-14 DIAGNOSIS — C541 Malignant neoplasm of endometrium: Secondary | ICD-10-CM

## 2014-06-15 ENCOUNTER — Other Ambulatory Visit: Payer: Self-pay | Admitting: Gynecologic Oncology

## 2014-06-15 ENCOUNTER — Inpatient Hospital Stay
Admission: RE | Admit: 2014-06-15 | Discharge: 2014-06-15 | Disposition: A | Discharge status: Home / Self Care | Payer: Self-pay | Source: Ambulatory Visit | Attending: Gynecologic Oncology | Admitting: Gynecologic Oncology

## 2014-06-15 DIAGNOSIS — C549 Malignant neoplasm of corpus uteri, unspecified: Secondary | ICD-10-CM

## 2014-06-15 DIAGNOSIS — C801 Malignant (primary) neoplasm, unspecified: Secondary | ICD-10-CM

## 2014-06-15 DIAGNOSIS — R222 Localized swelling, mass and lump, trunk: Secondary | ICD-10-CM

## 2014-06-16 DIAGNOSIS — M5136 Other intervertebral disc degeneration, lumbar region: Secondary | ICD-10-CM | POA: Diagnosis not present

## 2014-06-16 DIAGNOSIS — M4310 Spondylolisthesis, site unspecified: Secondary | ICD-10-CM | POA: Diagnosis not present

## 2014-06-20 ENCOUNTER — Other Ambulatory Visit: Payer: Self-pay | Admitting: Radiology

## 2014-06-21 ENCOUNTER — Ambulatory Visit (HOSPITAL_COMMUNITY)
Admission: RE | Admit: 2014-06-21 | Discharge: 2014-06-21 | Disposition: A | Discharge status: Home / Self Care | Payer: Medicare Other | Source: Ambulatory Visit | Attending: Gynecologic Oncology | Admitting: Gynecologic Oncology

## 2014-06-21 ENCOUNTER — Encounter (HOSPITAL_COMMUNITY): Payer: Self-pay

## 2014-06-21 DIAGNOSIS — C7951 Secondary malignant neoplasm of bone: Secondary | ICD-10-CM | POA: Diagnosis not present

## 2014-06-21 DIAGNOSIS — M545 Low back pain: Secondary | ICD-10-CM | POA: Diagnosis present

## 2014-06-21 DIAGNOSIS — M899 Disorder of bone, unspecified: Secondary | ICD-10-CM | POA: Diagnosis not present

## 2014-06-21 DIAGNOSIS — Z853 Personal history of malignant neoplasm of breast: Secondary | ICD-10-CM | POA: Diagnosis not present

## 2014-06-21 DIAGNOSIS — R229 Localized swelling, mass and lump, unspecified: Secondary | ICD-10-CM | POA: Diagnosis not present

## 2014-06-21 DIAGNOSIS — C549 Malignant neoplasm of corpus uteri, unspecified: Secondary | ICD-10-CM | POA: Insufficient documentation

## 2014-06-21 DIAGNOSIS — R222 Localized swelling, mass and lump, trunk: Secondary | ICD-10-CM

## 2014-06-21 LAB — CBC
HCT: 37.9 % (ref 36.0–46.0)
Hemoglobin: 12.3 g/dL (ref 12.0–15.0)
MCH: 31.3 pg (ref 26.0–34.0)
MCHC: 32.5 g/dL (ref 30.0–36.0)
MCV: 96.4 fL (ref 78.0–100.0)
Platelets: 213 10*3/uL (ref 150–400)
RBC: 3.93 MIL/uL (ref 3.87–5.11)
RDW: 13.6 % (ref 11.5–15.5)
WBC: 6.2 10*3/uL (ref 4.0–10.5)

## 2014-06-21 LAB — PROTIME-INR
INR: 1 (ref 0.00–1.49)
Prothrombin Time: 13.3 seconds (ref 11.6–15.2)

## 2014-06-21 LAB — APTT: aPTT: 28 seconds (ref 24–37)

## 2014-06-21 MED ORDER — OXYCODONE-ACETAMINOPHEN 5-325 MG PO TABS
1.0000 | ORAL_TABLET | ORAL | Status: DC | PRN
  Filled 2014-06-21: qty 1

## 2014-06-21 MED ORDER — SODIUM CHLORIDE 0.9 % IV SOLN
INTRAVENOUS | Status: DC
  Administered 2014-06-21: 12:00:00 via INTRAVENOUS

## 2014-06-21 MED ORDER — FENTANYL CITRATE 0.05 MG/ML IJ SOLN
INTRAMUSCULAR | Status: AC
  Filled 2014-06-21: qty 4

## 2014-06-21 MED ORDER — MIDAZOLAM HCL 2 MG/2ML IJ SOLN
INTRAMUSCULAR | Status: AC | PRN
  Administered 2014-06-21: 0.5 mg via INTRAVENOUS
  Administered 2014-06-21: 1 mg via INTRAVENOUS

## 2014-06-21 MED ORDER — MIDAZOLAM HCL 2 MG/2ML IJ SOLN
INTRAMUSCULAR | Status: AC
  Filled 2014-06-21: qty 6

## 2014-06-21 MED ORDER — FENTANYL CITRATE 0.05 MG/ML IJ SOLN
INTRAMUSCULAR | Status: AC | PRN
  Administered 2014-06-21: 25 ug via INTRAVENOUS
  Administered 2014-06-21: 50 ug via INTRAVENOUS

## 2014-06-21 NOTE — Procedures (Signed)
CT guided core biopsies of left lumbar paraspinal lesion.  5 cores obtained.  No immediate complication.

## 2014-06-21 NOTE — H&P (Signed)
Chief Complaint: "I'm here for a biopsy"  Referring Physician(s): Cross,Melissa D  History of Present Illness: [name] is a 74 y.o. female with remote history of breast cancer (2000) and  recurrent uterine papillary serous carcinoma initially stage IB diagnosed in 2009. She is s/p resection of pelvic mass, left sided ureterolysis, intra-operative radiation therapy by radiation oncology, omental J flap, and left ureteral stent placement by urology at [facility] on 04/02/13. 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 Recent MRI reveals a left paraspinal mass (L3-4 region) and pt presents today for CT guided biopsy of the mass.   Past Medical History  Diagnosis Date  . Hypertension   . Palpitations   . History of breast cancer 2000    right breast -- treated with lumpectomy and radiation therarpy, postoperatively with tamoxifen   . Hyperlipidemia   . Hypercholesterolemia   . PVC's (premature ventricular contractions)   . S/P radiation therapy 10/28/11-12/05/11    5040 cGy left pelvis  . S/P radiation therapy     Intracavitary brachytherapy of uterus  . Endometrial cancer 12/2007    s/p total abdominal hysterectomy at [facility] - ovaries were also removed     Past Surgical History  Procedure Laterality Date  . Total abdominal hysterectomy w/ bilateral salpingoophorectomy    . Breast lumpectomy    . Appendectomy    . Tonsillectomy    . Foot surgery      RIGHT    Allergies: Ace inhibitors; Codeine; Demerol; and Dilaudid  Medications: Prior to Admission medications   Medication Sig Start Date End Date Taking? Authorizing Provider  ALPRAZolam (XANAX) 0.25 MG tablet Take 0.25 mg by mouth 2 (two) times daily as needed for anxiety.    Yes Historical Provider, MD  amLODipine (NORVASC) 5 MG  tablet Take 5 mg by mouth daily.   Yes Historical Provider, MD  aspirin EC 81 MG tablet Take 81 mg by mouth daily.   Yes Historical Provider, MD  atenolol (TENORMIN) 25 MG tablet Take 25 mg by mouth 2 (two) times daily.  01/08/11  Yes Darlin Coco, MD  Biotin 5000 MCG TABS Take 5,000 mcg by mouth daily.   Yes Historical Provider, MD  cholecalciferol (VITAMIN D) 1000 UNITS tablet Take 1,000 Units by mouth every Monday, Wednesday, and Friday.   Yes Historical Provider, MD  Cyanocobalamin (VITAMIN B-12) 5000 MCG SUBL Place under the tongue.   Yes Historical Provider, MD  fish oil-omega-3 fatty acids 1000 MG capsule Take 1 g by mouth daily.     Yes Historical Provider, MD  gabapentin (NEURONTIN) 300 MG capsule Take 300 mg by mouth 3 (three) times daily.  05/27/14  Yes Historical Provider, MD  Magnesium 250 MG TABS Take 250 mg by mouth every Monday, Wednesday, and Friday.    Yes Historical Provider, MD  Multiple Vitamin (MULTIVITAMIN WITH MINERALS) TABS tablet Take 1 tablet by mouth daily.   Yes Historical Provider, MD  naproxen (NAPROSYN) 500 MG tablet Take 500 mg by mouth 2 (two) times daily.   Yes Historical Provider, MD  Omega-3 Fatty Acids (FISH OIL PO) Take 360 mg by mouth daily.   Yes Historical Provider, MD  oxyCODONE-acetaminophen (PERCOCET) 10-325 MG per tablet Take 1 tablet by mouth every 4 (four) hours as needed for pain.  06/07/14  Yes Historical Provider, MD  potassium gluconate 595 MG TABS tablet Take 595 mg by mouth every Monday, Wednesday, and Friday.   Yes Historical Provider, MD  simvastatin (ZOCOR) 20 MG tablet Take 20 mg by mouth daily.   Yes Historical Provider, MD    Family History  Problem Relation Age of Onset  . Thrombosis Father     coronary thrombosis  . Hypertension Father   . Heart failure Mother   . Coronary artery disease Brother     History   Social History  . Marital Status: Married    Spouse Name: N/A    Number of Children: N/A  . Years of Education: N/A    Social History Main Topics  . Smoking status: Former Smoker    Types: Cigarettes    Quit date: 05/13/1962  . Smokeless tobacco: Never Used  . Alcohol Use: 3.0 oz/week    5 Glasses of wine per week  . Drug Use: No  . Sexual Activity: No   Other Topics Concern  . None   Social History Narrative      Review of Systems  Constitutional: Negative for fever and chills.  Respiratory: Negative for cough and shortness of breath.   Cardiovascular: Negative for chest pain.  Gastrointestinal: Negative for nausea, vomiting, abdominal pain and blood in stool.  Genitourinary: Negative for dysuria and hematuria.  Musculoskeletal: Positive for back pain.  Neurological: Negative for headaches.  Hematological: Does not bruise/bleed easily.  Psychiatric/Behavioral: The patient is nervous/anxious.     Vital Signs: BP 148/76 mmHg  Pulse 76  Temp(Src) 98.4 F (36.9 C) (Oral)  Resp 18  SpO2 97%  Physical Exam  Constitutional: She is oriented to person, place, and time. She appears well-developed and well-nourished.  Cardiovascular: Normal rate and regular rhythm.   Pulmonary/Chest: Effort normal and breath sounds normal.  Abdominal: Soft. Bowel sounds are normal. There is no tenderness.  Musculoskeletal: Normal range of motion. She exhibits no edema.  Neurological: She is alert and oriented to person, place, and time.    Imaging: No results found.  Labs:  CBC:  Recent Labs  06/21/14 1135  WBC 6.2  HGB 12.3  HCT 37.9  PLT 213    COAGS:  Recent Labs  06/21/14 1135  INR 1.00  APTT 28    BMP:  Recent Labs  07/23/13 1051 02/07/14 0940  BUN 21.7 19.6  CREATININE 0.8 0.8    LIVER FUNCTION TESTS: No results for input(s): BILITOT, AST, ALT, ALKPHOS, PROT, ALBUMIN in the last 8760 hours.  TUMOR MARKERS: No results for input(s): AFPTM, CEA, CA199, CHROMGRNA in the last 8760 hours.  Assessment and Plan: [name] is a 74 y.o. female with remote history of  breast cancer (2000) and  recurrent uterine papillary serous carcinoma initially stage IB diagnosed in 2009. She is s/p resection of pelvic mass, left sided ureterolysis, intra-operative radiation therapy by radiation oncology, omental J flap, and left ureteral stent placement by urology at [facility] on 04/02/13. 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 Recent MRI reveals a left paraspinal mass (L3-4 region) and pt presents today for CT guided biopsy of the mass. Details/risks of procedure d/w pt/family with their understanding and consent.    Signed: Autumn Messing 06/21/2014, 12:12 PM

## 2014-06-21 NOTE — Discharge Instructions (Signed)
Biopsy Care After Leave dressing for 24 hours then remove and shower as usual. No change in medication or diet.  Light activity for 24 hours. Call [facility] for any problems or questions.   Refer to this sheet in the next few weeks. These instructions provide you with information on caring for yourself after your procedure. Your caregiver may also give you more specific instructions. Your treatment has been planned according to current medical practices, but problems sometimes occur. Call your caregiver if you have any problems or questions after your procedure. If you had a fine needle biopsy, you may have soreness at the biopsy site for 1 to 2 days. If you had an open biopsy, you may have soreness at the biopsy site for 3 to 4 days. HOME CARE INSTRUCTIONS   You may resume normal diet and activities as directed.  Change bandages (dressings) as directed. If your wound was closed with a skin glue (adhesive), it will wear off and begin to peel in 7 days.  Only take over-the-counter or prescription medicines for pain, discomfort, or fever as directed by your caregiver.  Ask your caregiver when you can bathe and get your wound wet. SEEK IMMEDIATE MEDICAL CARE IF:   You have increased bleeding (more than a small spot) from the biopsy site.  You notice redness, swelling, or increasing pain at the biopsy site.  You have pus coming from the biopsy site.  You have a fever.  You notice a bad smell coming from the biopsy site or dressing.  You have a rash, have difficulty breathing, or have any allergic problems. MAKE SURE YOU:   Understand these instructions.  Will watch your condition.  Will get help right away if you are not doing well or get worse. Document Released: 11/16/2004 Document Revised: 07/22/2011 Document Reviewed: 10/25/2010 [facility] Patient Information 2015 [place], [place]. This information is not intended to replace advice given to you by your health care  provider. Make sure you discuss any questions you have with your health care provider. Conscious Sedation Sedation is the use of medicines to promote relaxation and relieve discomfort and anxiety. Conscious sedation is a type of sedation. Under conscious sedation you are less alert than normal but are still able to respond to instructions or stimulation. Conscious sedation is used during short medical and dental procedures. It is milder than deep sedation or general anesthesia and allows you to return to your regular activities sooner.  LET [facility] CARE PROVIDER KNOW ABOUT:   Any allergies you have.  All medicines you are taking, including vitamins, herbs, eye drops, creams, and over-the-counter medicines.  Use of steroids (by mouth or creams).  Previous problems you or members of your family have had with the use of anesthetics.  Any blood disorders you have.  Previous surgeries you have had.  Medical conditions you have.  Possibility of pregnancy, if this applies.  Use of cigarettes, alcohol, or illegal drugs. RISKS AND COMPLICATIONS Generally, this is a safe procedure. However, as with any procedure, problems can occur. Possible problems include:  Oversedation.  Trouble breathing on your own. You may need to have a breathing tube until you are awake and breathing on your own.  Allergic reaction to any of the medicines used for the procedure. BEFORE THE PROCEDURE  You may have blood tests done. These tests can help show how well your kidneys and liver are working. They can also show how well your blood clots.  A physical exam will  be done.  Only take medicines as directed by your health care provider. You may need to stop taking medicines (such as blood thinners, aspirin, or nonsteroidal anti-inflammatory drugs) before the procedure.   Do not eat or drink at least 6 hours before the procedure or as directed by your health care provider.  Arrange for a responsible  adult, family member, or friend to take you home after the procedure. He or she should stay with you for at least 24 hours after the procedure, until the medicine has worn off. PROCEDURE   An intravenous (IV) catheter will be inserted into one of your veins. Medicine will be able to flow directly into your body through this catheter. You may be given medicine through this tube to help prevent pain and help you relax.  The medical or dental procedure will be done. AFTER THE PROCEDURE  You will stay in a recovery area until the medicine has worn off. Your blood pressure and pulse will be checked.   Depending on the procedure you had, you may be allowed to go home when you can tolerate liquids and your pain is under control. Document Released: 01/22/2001 Document Revised: 05/04/2013 Document Reviewed: 01/04/2013 [facility] Patient Information 2015 [place], [place]. This information is not intended to replace advice given to you by your health care provider. Make sure you discuss any questions you have with your health care provider.

## 2014-06-22 ENCOUNTER — Encounter (HOSPITAL_COMMUNITY)
Admission: RE | Admit: 2014-06-22 | Discharge: 2014-06-22 | Disposition: A | Discharge status: Home / Self Care | Payer: Medicare Other | Source: Ambulatory Visit | Attending: Gynecologic Oncology | Admitting: Gynecologic Oncology

## 2014-06-22 DIAGNOSIS — R29818 Other symptoms and signs involving the nervous system: Secondary | ICD-10-CM | POA: Diagnosis not present

## 2014-06-22 DIAGNOSIS — R948 Abnormal results of function studies of other organs and systems: Secondary | ICD-10-CM | POA: Diagnosis not present

## 2014-06-22 DIAGNOSIS — C549 Malignant neoplasm of corpus uteri, unspecified: Secondary | ICD-10-CM | POA: Diagnosis not present

## 2014-06-22 DIAGNOSIS — R222 Localized swelling, mass and lump, trunk: Secondary | ICD-10-CM

## 2014-06-22 MED ORDER — TECHNETIUM TC 99M MEDRONATE IV KIT
25.0000 | PACK | Freq: Once | INTRAVENOUS | Status: AC | PRN
  Administered 2014-06-22: 25 via INTRAVENOUS

## 2014-06-27 ENCOUNTER — Other Ambulatory Visit: Payer: Self-pay | Admitting: Oncology

## 2014-06-27 ENCOUNTER — Other Ambulatory Visit: Payer: Self-pay | Admitting: Gynecologic Oncology

## 2014-06-27 DIAGNOSIS — C549 Malignant neoplasm of corpus uteri, unspecified: Secondary | ICD-10-CM

## 2014-06-27 DIAGNOSIS — C7951 Secondary malignant neoplasm of bone: Secondary | ICD-10-CM | POA: Insufficient documentation

## 2014-06-27 NOTE — Progress Notes (Signed)
Arranged for patient to see [facility] and Marko Plume for zometa per Dr. Alycia Rossetti for recurrent metastatic uterine cancer to the spine.

## 2014-06-27 NOTE — Progress Notes (Signed)
Histology and Location of Primary Cancer: endometrial cancer  Sites of Visceral and Bony Metastatic Disease: left pelvic sidewall nodal masses, PET scan from 06/22/14 shows "Subtle increased uptake in the greater trochanter of the right Hip." She also has a soft tissue lesion along the left side of L3-L4.  Location(s) of Symptomatic Metastases: a soft tissue lesion along the left side of L3-L4  Past/Anticipated chemotherapy by medical oncology, if any: will see Dr. Marko Plume on 2/25, plans for Zometa  Pain on a scale of 0-10 is: 0. Patient has been taking oxycodone 10 mg q 4 hours and aleve twice a day.  She has pain in her lower back and left leg and knee.   If Spine Met(s), symptoms, if any, include:  Bowel/Bladder retention or incontinence (please describe): no  Numbness or weakness in extremities (please describe): reports she is not as strong as a month ago, was working out until January, reports numbness in her left leg.  Current Decadron regimen, if applicable: no  Ambulatory status? Walker? Wheelchair?: Ambulatory  SAFETY ISSUES:  Prior radiation? Yes, 2000 - right breast, 10/28/11-12/05/11 - 5040 cGy left pelvis, Intracavitary brachytherapy of uterus, 03/2013 interoperative radiation to abdominal lymph node  Pacemaker/ICD? no  Possible current pregnancy? no  Is the patient on methotrexate? no  Current Complaints / other details:  CT biopsy done on 06/21/14 shows "Bone, biopsy, left lumbar paraspinal - METASTATIC POORLY DIFFERENTIATED CARCINOMA, CONSISTENT WITH HIGH GRADE SEROUS CARCINOMA SEE COMMENT."  Patient is here with her husband and daughter.  BP 147/68 mmHg  Pulse 73  Temp(Src) 98.7 F (37.1 C) (Oral)  Resp 16  Ht $R'4\' 11"'oH$  (1.499 m)  Wt 110 lb 8 oz (50.122 kg)  BMI 22.31 kg/m2

## 2014-06-28 ENCOUNTER — Telehealth: Payer: Self-pay | Admitting: Oncology

## 2014-06-28 ENCOUNTER — Telehealth: Payer: Self-pay | Admitting: *Deleted

## 2014-06-28 ENCOUNTER — Other Ambulatory Visit: Payer: Self-pay | Admitting: Oncology

## 2014-06-28 ENCOUNTER — Ambulatory Visit (HOSPITAL_COMMUNITY): Payer: Medicare Other

## 2014-06-28 NOTE — Telephone Encounter (Signed)
Called patient as noted below by Dr. Marko Plume. She states she has already spoken with [facility] and is aware of appointments on 07/07/14.   Informed patient about zometa - discussed the risk osteonecrosis of the jaw but let patient know that this is very rare side effect. Per Dr. Marko Plume, patient does not need a dental evaluation prior to starting zometa. Told patient that she would need to notify Dr. Marko Plume if her dentist planned any dental procedures - patient agreeable to this. Patient states that both she and her daughter has been researching zometa and have no further questions. She states that if she thinks of any questions she will write them down and bring them to MD visit on 2/25. Told patient to call us back if she has any concerns before her appt - patient agreeable to this.

## 2014-06-28 NOTE — Telephone Encounter (Signed)
, °

## 2014-06-28 NOTE — Telephone Encounter (Signed)
-----   Message from Gordy Levan, MD sent at 06/27/2014 11:28 AM EST ----- New metastatic endometrial cancer to bone. Seeing Drs Alycia Rossetti and Sondra Come. Needs to see me and to start zometa - POF sent for schedulers to do this, likely next week.  RN please let her know I was sorry to hear about this and we'll get appts set up. Please do teaching by phone for zometa  thanks

## 2014-06-29 ENCOUNTER — Ambulatory Visit (HOSPITAL_COMMUNITY): Payer: Medicare Other

## 2014-06-29 ENCOUNTER — Encounter (HOSPITAL_COMMUNITY): Payer: Medicare Other

## 2014-06-30 ENCOUNTER — Ambulatory Visit
Admission: RE | Admit: 2014-06-30 | Discharge: 2014-06-30 | Disposition: A | Discharge status: Home / Self Care | Payer: Medicare Other | Source: Ambulatory Visit | Attending: Radiation Oncology | Admitting: Radiation Oncology

## 2014-06-30 ENCOUNTER — Encounter: Payer: Self-pay | Admitting: Radiation Oncology

## 2014-06-30 VITALS — BP 147/68 | HR 73 | Temp 98.7°F | Resp 16 | Ht 59.0 in | Wt 110.5 lb

## 2014-06-30 DIAGNOSIS — Z853 Personal history of malignant neoplasm of breast: Secondary | ICD-10-CM | POA: Insufficient documentation

## 2014-06-30 DIAGNOSIS — Z923 Personal history of irradiation: Secondary | ICD-10-CM | POA: Diagnosis not present

## 2014-06-30 DIAGNOSIS — C541 Malignant neoplasm of endometrium: Secondary | ICD-10-CM | POA: Diagnosis not present

## 2014-06-30 DIAGNOSIS — M79652 Pain in left thigh: Secondary | ICD-10-CM | POA: Diagnosis not present

## 2014-06-30 DIAGNOSIS — N393 Stress incontinence (female) (male): Secondary | ICD-10-CM | POA: Diagnosis not present

## 2014-06-30 DIAGNOSIS — C7951 Secondary malignant neoplasm of bone: Secondary | ICD-10-CM

## 2014-06-30 DIAGNOSIS — M25562 Pain in left knee: Secondary | ICD-10-CM | POA: Insufficient documentation

## 2014-06-30 DIAGNOSIS — Z7982 Long term (current) use of aspirin: Secondary | ICD-10-CM | POA: Insufficient documentation

## 2014-06-30 DIAGNOSIS — Z510 Encounter for antineoplastic radiation therapy: Secondary | ICD-10-CM | POA: Insufficient documentation

## 2014-06-30 DIAGNOSIS — Z791 Long term (current) use of non-steroidal anti-inflammatories (NSAID): Secondary | ICD-10-CM | POA: Diagnosis not present

## 2014-06-30 DIAGNOSIS — M545 Low back pain: Secondary | ICD-10-CM | POA: Insufficient documentation

## 2014-06-30 NOTE — Progress Notes (Signed)
  Radiation Oncology         [phone]) [phone] ________________________________  Name: [name] MRN: [id number]  Date: 06/30/2014  DOB: [birth year]  SIMULATION AND TREATMENT PLANNING NOTE    ICD-9-CM ICD-10-CM   1. 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  Identity was confirmed.  All relevant records and images related to the planned course of therapy were reviewed.  The patient freely provided informed written consent to proceed with treatment after reviewing the details related to the planned course of therapy. The consent form was witnessed and verified by the simulation staff.  Then, the patient was set-up in a stable reproducible  supine position for radiation therapy.  CT images were obtained.  Surface markings were placed.  The CT images were loaded into the planning software.  Then the target and avoidance structures were contoured.  Treatment planning then occurred.  The radiation prescription was entered and confirmed.  Then, I designed and supervised the construction of a total of 1 medically necessary complex treatment devices.  I have requested : Intensity Modulated Radiotherapy (IMRT) is medically necessary for this case for the following reason:  Small bowel sparing and left kidney sparing.  I have ordered:dose calc.  PLAN:  The patient will receive 55 Gy in 25 fractions using intensity modulated radiation therapy with simultaneous integrated boost technique. PTV55, PTV50, PTV45. ________________________________  v   Special treatment procedure note  Patient is received previous radiation therapy to the left upper pelvis region. Additional time was taken in reviewing the patient's previous radiation treatments as it relates to her current set up. Given the increased time and the  potential for overlapping fields, this constitutes a special treatment procedure.   -----------------------------------  Blair Promise, PhD, MD

## 2014-06-30 NOTE — Progress Notes (Signed)
Please see the Nurse Progress Note in the MD Initial Consult Encounter for this patient. 

## 2014-06-30 NOTE — Progress Notes (Signed)
Radiation Oncology         (336) [phone] ________________________________  Name: [name] MRN: [id number]  Date: 06/30/2014  DOB: 1940-05-18  Reevaluation Note  CC: Precious Reel, MD  Sherol Dade., MD    ICD-9-CM ICD-10-CM   1. Metastatic cancer to bone 198.5 C79.51   2. Endometrial cancer 182.0 C54.1     Diagnosis:   Recurrent uterine papillary serous carcinoma,  initially stage IB  Interval Since Last Radiation: 2 years and 7 months  Narrative:  The patient returns today at the courtesy of Dr. Alycia Rossetti consideration for additional radiation therapy as part of management of the patient's recurrent papillary serous carcinoma. Patient is received 2 courses of radiation therapy as part of her overall management. She was initially treated in November and December 2009 with adjuvant therapy directed at the vaginal cuff. She received 30 gray in 5 fractions. Patient developed a recurrence in the left pelvis region and received radiation therapy from 04/28/2012 through 12/05/2011, 50.4 gray in 28 fractions. The patient has also received intraoperative radiation therapy at [facility].  More recently the patient has had increasing low back pain. She was seen by her orthopedic surgeon. A MRI was performed which revealed a left lumbar paraspinal mass. This was biopsied on February 9 and returned metastatic poorly differentiated carcinoma consistent with high-grade serous carcinoma. In light of the patient's pain and isolated recurrence in this area she is now seen in radiation oncology for consideration for additional treatment.  Patient does have some pain radiating into her left thigh and knee area. She has possibly slight weakness in her left leg but is able to walk without difficulty. She denies any bowel or bladder issues. She has chronic mild urinary stress incontinence.                             ALLERGIES:  is allergic to ace inhibitors; codeine; demerol; and  dilaudid.  Meds: Current Outpatient Prescriptions  Medication Sig Dispense Refill  . amLODipine (NORVASC) 5 MG tablet Take 5 mg by mouth daily.    Marland Kitchen aspirin EC 81 MG tablet Take 81 mg by mouth daily.    Marland Kitchen atenolol (TENORMIN) 25 MG tablet Take 25 mg by mouth 2 (two) times daily.     . Biotin 5000 MCG TABS Take 5,000 mcg by mouth daily.    . cholecalciferol (VITAMIN D) 1000 UNITS tablet Take 1,000 Units by mouth every Monday, Wednesday, and Friday.    . Cyanocobalamin (VITAMIN B-12) 5000 MCG SUBL Place under the tongue.    . fish oil-omega-3 fatty acids 1000 MG capsule Take 1 g by mouth daily.      Marland Kitchen gabapentin (NEURONTIN) 300 MG capsule Take 300 mg by mouth 3 (three) times daily.   0  . Magnesium 250 MG TABS Take 250 mg by mouth every Monday, Wednesday, and Friday.     . Multiple Vitamin (MULTIVITAMIN WITH MINERALS) TABS tablet Take 1 tablet by mouth daily.    . naproxen (NAPROSYN) 500 MG tablet Take 500 mg by mouth 2 (two) times daily.    . Omega-3 Fatty Acids (FISH OIL PO) Take 360 mg by mouth daily.    . OxyCODONE HCl, Abuse Deter, 5 MG TABA Take 10 mg by mouth every 4 (four) hours as needed.    . potassium gluconate 595 MG TABS tablet Take 595 mg by mouth every Monday, Wednesday, and Friday.    Marland Kitchen  simvastatin (ZOCOR) 20 MG tablet Take 20 mg by mouth daily.    Marland Kitchen ALPRAZolam (XANAX) 0.25 MG tablet Take 0.25 mg by mouth 2 (two) times daily as needed for anxiety.     Marland Kitchen oxyCODONE-acetaminophen (PERCOCET) 10-325 MG per tablet Take 1 tablet by mouth every 4 (four) hours as needed for pain.      No current facility-administered medications for this encounter.    Physical Findings: The patient is in no acute distress. Patient is alert and oriented.  height is 4\' 11"  (1.499 m) and weight is 110 lb 8 oz (50.122 kg). Her oral temperature is 98.7 F (37.1 C). Her blood pressure is 147/68 and her pulse is 73. Her respiration is 16. .  She is accompanied by her daughter and husband on evaluation  today. Examination of the neck and supraclavicular region reveals no evidence of adenopathy. The axillary areas are free of adenopathy. The lungs are clear to auscultation. The heart has regular rhythm and rate. The abdomen is soft and nontender with normal bowel sounds. Palpation along the bar spine area reveals no obvious point tenderness. On neurological examination motor strength is 5 out of 5 in the proximal and distal muscle groups of the lower extremities. Patient has a brisk reflexes in the right patella however is diminished on the left side.  Lab Findings: Lab Results  Component Value Date   WBC 6.2 06/21/2014   HGB 12.3 06/21/2014   HCT 37.9 06/21/2014   MCV 96.4 06/21/2014   PLT 213 06/21/2014    Radiographic Findings: Nm Bone Scan Whole Body  06/22/2014   CLINICAL DATA:  Lumbar spine pain ; history of breast and endometrial malignancy  EXAM: NUCLEAR MEDICINE WHOLE BODY BONE SCAN  TECHNIQUE: Whole body anterior and posterior images were obtained approximately 3 hours after intravenous injection of radiopharmaceutical.  RADIOPHARMACEUTICALS:  Twenty-five mCi Technetium-99 MDP  COMPARISON:  MRI of the lumbar spine of May 13, 2014  FINDINGS: There is adequate uptake of the radiopharmaceutical by the skeleton. There is adequate soft tissue clearance and renal activity. Uptake over the calvarium is normal. There is focally increased uptake posteriorly in the upper lumbar spine at approximately L2 on the right. Uptake over the pectoral girdle is normal. Uptake over the pelvis and lower extremities is normal with exception of a subtle focus of increased uptake in the greater trochanter of the right femur.  IMPRESSION: 1. Mildly increased uptake in the posterior elements of approximately L2. On the MRI of June 13, 2014 degenerative disc and facet joint change at L1-2 was noted here. 2. Subtle increased uptake in the greater trochanter of the right hip. Plain films are recommended.    Electronically Signed   By: David  Martinique   On: 06/22/2014 16:28   Ct Biopsy  06/21/2014   CLINICAL DATA:  74 year old with history of breast and uterine cancer. Patient has severe back pain. Patient has a suspicious lesion in the lumbar left paraspinal tissues. Tissue diagnosis is needed.  EXAM: CT GUIDED BIOPSY OF LEFT PARASPINAL LESION  Physician: Stephan Minister. Anselm Pancoast, MD  MEDICATIONS: 1.5 mg versed, 75 mcg fentanyl. A radiology nurse monitored the patient for moderate sedation.  ANESTHESIA/SEDATION: Sedation time: 15 min  PROCEDURE: The procedure was explained to the patient. The risks and benefits of the procedure were discussed and the patient's questions were addressed. Informed consent was obtained from the patient. The patient was placed prone on CT scan. Images through the lower abdomen were obtained. Lesion in the  lumbar left paraspinal region was identified. The left side of the back was prepped and draped in a sterile fashion. 1% lidocaine was used for local anesthetic. A 17 gauge needle was directed into the lateral aspect of the lesion. Five core biopsies were performed with an 18 gauge needle. Specimens placed in formalin. Needle was removed without complication. Bandage placed over the puncture site.  FINDINGS: There is a soft tissue lesion along the left side of L3-L4. This lesion roughly measures up to 2.2 cm. Needle was positioned along the posterior aspect of the lesion. Small amount of air in the paraspinal tissues following needle removal.  COMPLICATIONS: None  IMPRESSION: CT-guided core biopsies of the lumbar left paraspinal lesion.   Electronically Signed   By: Markus Daft M.D.   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 Patient appears to have an isolated recurrence in the left lumbar paraspinal area which is causing significant pain for the patient. She would be a good candidate for aggressive palliative radiation therapy directed at this area. I  discussed treatment course side effects and potential toxicities of radiation therapy in this situation with the patient and her family. She appears to understand wishes to proceed with planned course of treatment.  Plan:  Simulation and planning this afternoon with treatments to begin in the near future. Anticipate between 5 and 5-1/2 weeks of radiation therapy as part of her management.  ____________________________________ Blair Promise, MD

## 2014-07-01 NOTE — Addendum Note (Signed)
Encounter addended by: Jacqulyn Liner, RN on: 07/01/2014  9:35 AM<BR>     Documentation filed: Charges VN

## 2014-07-03 ENCOUNTER — Other Ambulatory Visit: Payer: Self-pay | Admitting: Oncology

## 2014-07-06 DIAGNOSIS — C7951 Secondary malignant neoplasm of bone: Secondary | ICD-10-CM | POA: Diagnosis not present

## 2014-07-06 DIAGNOSIS — M545 Low back pain: Secondary | ICD-10-CM | POA: Diagnosis not present

## 2014-07-06 DIAGNOSIS — N393 Stress incontinence (female) (male): Secondary | ICD-10-CM | POA: Diagnosis not present

## 2014-07-06 DIAGNOSIS — C541 Malignant neoplasm of endometrium: Secondary | ICD-10-CM | POA: Diagnosis not present

## 2014-07-06 DIAGNOSIS — Z923 Personal history of irradiation: Secondary | ICD-10-CM | POA: Diagnosis not present

## 2014-07-06 DIAGNOSIS — Z510 Encounter for antineoplastic radiation therapy: Secondary | ICD-10-CM | POA: Diagnosis not present

## 2014-07-07 ENCOUNTER — Ambulatory Visit (HOSPITAL_BASED_OUTPATIENT_CLINIC_OR_DEPARTMENT_OTHER): Payer: Medicare Other | Admitting: Oncology

## 2014-07-07 ENCOUNTER — Telehealth: Payer: Self-pay | Admitting: Oncology

## 2014-07-07 ENCOUNTER — Ambulatory Visit (HOSPITAL_BASED_OUTPATIENT_CLINIC_OR_DEPARTMENT_OTHER): Payer: Medicare Other

## 2014-07-07 ENCOUNTER — Other Ambulatory Visit (HOSPITAL_BASED_OUTPATIENT_CLINIC_OR_DEPARTMENT_OTHER): Payer: Medicare Other

## 2014-07-07 ENCOUNTER — Encounter: Payer: Self-pay | Admitting: Oncology

## 2014-07-07 VITALS — BP 152/70 | HR 65 | Temp 98.6°F | Resp 20 | Ht 59.0 in | Wt 109.2 lb

## 2014-07-07 DIAGNOSIS — C7951 Secondary malignant neoplasm of bone: Secondary | ICD-10-CM

## 2014-07-07 DIAGNOSIS — Z510 Encounter for antineoplastic radiation therapy: Secondary | ICD-10-CM | POA: Diagnosis not present

## 2014-07-07 DIAGNOSIS — C541 Malignant neoplasm of endometrium: Secondary | ICD-10-CM

## 2014-07-07 DIAGNOSIS — C549 Malignant neoplasm of corpus uteri, unspecified: Secondary | ICD-10-CM

## 2014-07-07 DIAGNOSIS — N393 Stress incontinence (female) (male): Secondary | ICD-10-CM | POA: Diagnosis not present

## 2014-07-07 DIAGNOSIS — Z853 Personal history of malignant neoplasm of breast: Secondary | ICD-10-CM | POA: Diagnosis not present

## 2014-07-07 DIAGNOSIS — Z923 Personal history of irradiation: Secondary | ICD-10-CM | POA: Diagnosis not present

## 2014-07-07 DIAGNOSIS — M545 Low back pain: Secondary | ICD-10-CM | POA: Diagnosis not present

## 2014-07-07 LAB — COMPREHENSIVE METABOLIC PANEL (CC13)
ALT: 12 U/L (ref 0–55)
AST: 21 U/L (ref 5–34)
Albumin: 3.6 g/dL (ref 3.5–5.0)
Alkaline Phosphatase: 44 U/L (ref 40–150)
Anion Gap: 11 mEq/L (ref 3–11)
BUN: 20.5 mg/dL (ref 7.0–26.0)
CO2: 26 mEq/L (ref 22–29)
Calcium: 9.3 mg/dL (ref 8.4–10.4)
Chloride: 106 mEq/L (ref 98–109)
Creatinine: 0.8 mg/dL (ref 0.6–1.1)
EGFR: 75 mL/min/{1.73_m2} — ABNORMAL LOW (ref >90)
Glucose: 88 mg/dl (ref 70–140)
Potassium: 3.9 mEq/L (ref 3.5–5.1)
Sodium: 143 mEq/L (ref 136–145)
Total Bilirubin: 0.46 mg/dL (ref 0.20–1.20)
Total Protein: 6.5 g/dL (ref 6.4–8.3)

## 2014-07-07 LAB — CBC WITH DIFFERENTIAL/PLATELET
BASO%: 0.5 % (ref 0.0–2.0)
Basophils Absolute: 0 10*3/uL (ref 0.0–0.1)
EOS%: 2 % (ref 0.0–7.0)
Eosinophils Absolute: 0.2 10*3/uL (ref 0.0–0.5)
HCT: 38.8 % (ref 34.8–46.6)
HGB: 12.5 g/dL (ref 11.6–15.9)
LYMPH%: 15 % (ref 14.0–49.7)
MCH: 30.4 pg (ref 25.1–34.0)
MCHC: 32.2 g/dL (ref 31.5–36.0)
MCV: 94.6 fL (ref 79.5–101.0)
MONO#: 0.9 10*3/uL (ref 0.1–0.9)
MONO%: 10 % (ref 0.0–14.0)
NEUT#: 6.6 10*3/uL — ABNORMAL HIGH (ref 1.5–6.5)
NEUT%: 72.5 % (ref 38.4–76.8)
Platelets: 218 10*3/uL (ref 145–400)
RBC: 4.11 10*6/uL (ref 3.70–5.45)
RDW: 14 % (ref 11.2–14.5)
WBC: 9.1 10*3/uL (ref 3.9–10.3)
lymph#: 1.4 10*3/uL (ref 0.9–3.3)

## 2014-07-07 MED ORDER — ZOLEDRONIC ACID 4 MG/5ML IV CONC
3.0000 mg | Freq: Once | INTRAVENOUS | Status: AC
  Administered 2014-07-07: 3 mg via INTRAVENOUS
  Filled 2014-07-07: qty 3.75

## 2014-07-07 NOTE — Patient Instructions (Signed)

## 2014-07-07 NOTE — Progress Notes (Signed)
OFFICE PROGRESS NOTE   July 07, 2014   Physicians:P.Gehrig, J.Kinard, (T.Brackbill) J.Russo, S.Ganem  INTERVAL HISTORY:  Patient is seen, together with husband and daughter [name], for the first time back by this physician since 12-2011, now at the request of Dr Alycia Rossetti for consideration of IV bisphosphonate treatment for progressive metastatic endometrial carcinoma involving bone.  Oncologic history is detailed below;  patient has been on observation without evidence of progressive disease until now since completing radiation to left pelvic wall recurrence of the endometrial cancer in 11-2011. She had unremarkable CT AP in 01-2014 with visit to Dr Alycia Rossetti then. She has scoliosis and some chronic intermittent low back discomfort, followed by Drs Tonita Cong and Ramos. She developed different,  persistent discomfort low back and radiating to LLE by ~ Dec into Jan 2016. Prior to Jan she had been exercising at gym and walking 5 days weekly "I was feeling so well with the exercise". She had MRI lumbar spine at [facility] on 06-13-2014, report reviewed for this consultation and will be scanned into this EMR; images from the MRI already available in this EMR. The MRI showed known scoliosis and degenerative disc disease, with new finding of 2.7 x 2.4 x 1.9 cm soft tissue mass at L3-4 involving left neural foramen, with focal cortical destruction of adjacent L3 vertebral body and portion of left L4 pedicle. She had CT biopsy on 06-21-14, with pathology ([id number]) poorly differentiated carcinoma consistent with high grade serous. Bone scan 06-22-14 had uptake posteriorly in upper lumbar spine and a subtle increased area of uptake right greater trochanter of unclear significance. She has seen Dr Alycia Rossetti and has had consultation with Dr Sondra Come, with IMRT planned 2-29 thru 08-12-14. She is to have CT CAP and to see Dr Alycia Rossetti shortly after RT completes in early April.   Patient is still aware of the neuropathic type  discomfort in LLE to knee, tho this is better controlled with present neurontin 300 mg tid, naproxen bid and oxycodone generally tid. She has no other pain and no other new or different symptoms. Appetite has been decreased with anxiety about this illness, however she is trying to eat including ice cream for calories. She has had some constipation with the pain medication, laxatives and stool softeners discussed. She has had no swelling in LE, no respiratory symptoms, no GERD or nausea. Bladder function is at baseline.  Cancelled 6 mo dental cleaning due in Jan, no acute dental problems and no chronic dental concerns. (dentist Humberto Leep), has dental xrays yearly  She is due PE with Dr Virgina Jock in ~ March or April. Bilateral tomo mammograms were at [facility] 10-18-2013, with extremely dense breast tissue but no other mammographic findings of concern.   No PAC Flu vaccine done   ONCOLOGIC HISTORY   Malignant neoplasm of corpus uteri, except isthmus   12/22/2007 Surgery Staging for IA UPSC    - 05/28/2008 Chemotherapy 6 cycles of paclitaxel and carboplatin with vaginal cuff brachytherapy   03/22/2011 Relapse/Recurrence Left pelvic node recurrence   04/25/2011 Surgery L/S evaluation, not resectable.   05/16/2011 - 09/25/2011 Chemotherapy 6 cyclces paclitaxel and carboplatin    - 12/27/2011 Radiation Therapy radiation completed    Endometrial cancer   12/01/2007 Initial Diagnosis Endometrial cancer   12/15/2007 Surgery    01/22/2008 -  Chemotherapy 6 cycles paclitaxol and carboplatin    Radiation Therapy HDR   03/31/2011 Relapse/Recurrence chemotherapy and external beam radiation   03/10/2013 Progression    04/02/2013 Surgery pelvic mass  resection with IORT    BREAST CANCER:  microinvasive T1N0 right breast cancer which was ER/PR positive and HER-2 2+ with insufficient material for FISH at diagnosis in 03/1999, treated with lumpectomy with 2 sentinel node evaluation, local radiation and 5 years of  Tamoxifen thru 05/2004, then aromatase inhibitor 07/2004 thru 04/2006.   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 She had 6 cycles of taxol/carboplatin through Jan 2010, and vaginal cuff brachytherapy. She did well until some vague LUQ symptoms in Nov. 2012, with CT AP in [facility] system Mar 22, 2011 showing left pelvic sidewall involvement. She had laparoscopic evaluation at [facility] 04-25-2011, with finding of dense fibrosis in the area as well as mass encasing obturator nerve and vein as well as internal iliac vasculature; left ureterolysis was performed. No pathology was submitted from that procedure. She received 3 additional cycles of taxol/ carboplatin from 05-16-2011 thru 06-28-2011. CT AP on 07-17-2011 showed improvement in the left pelvic sidewall mass now 1.9 x 2 cm and 3.4 cm cephalocaudad, as compared with 2.5 x 2.2 cm and 5.4 cm on CT 03-22-11, and no progressive disease elsewhere. She saw Dr.Gehrig after the CT, with recommendation for an additonal 3 cycles of same chemotherapy then repeat scan. Cycle 4 was taxol + carbo and neulasta, cycle 5 taxol only due to counts and cycle 6 taxol + carboplatin on 09-17-11. Follow up CT in May 2013 showed enlargement of the left pelvic sidewall involvement. She had IMRT 50 GY in 28 fractions by Dr Sondra Come, that completed 12-05-11. She had regular follow up scans,  Including CT AP 01-2014 and PET 02-2013, which did not show progressive disease.  Review of systems as above, also: No recent infectious illness. No bleeding. No other neurologic symptoms.  Remainder of 10 point Review of Systems negative.  Objective:  Vital signs in last 24 hours:  BP 152/70 mmHg  Pulse 65  Temp(Src) 98.6 F (37 C) (Oral)  Resp 20  Ht $R'4\' 11"'Ar$  (1.499 m)  Wt 109 lb 3.2 oz (49.533 kg)  BMI 22.04 kg/m2  SpO2 99% Weight up 2 lbs fronm  2013. Alert, oriented and appropriate. Ambulatory without difficulty. Not in any acute discomfort. Excellent historian and very pleasant as always, family very supportive. No alopecia  HEENT:PERRL, sclerae not icteric. Oral mucosa moist without lesions, posterior pharynx clear.  Neck supple. No JVD.  Lymphatics:no cervical,supraclavicular, axillary or inguinal adenopathy Resp: clear to auscultation bilaterally and normal percussion bilaterally Cardio: regular rate and rhythm. No gallop. GI: soft, nontender, not distended, no mass or organomegaly. Normally active bowel sounds. Surgical incision not remarkable. Musculoskeletal/ Extremities: without pitting edema, cords, tenderness. Back with obvious scoliosis, spine not tender. Neuro: no peripheral neuropathy. Otherwise nonfocal Skin without rash, ecchymosis, petechiae Breasts: patient preferred not to undress for breast exam  Lab Results:  Results for orders placed or performed in visit on 07/07/14  CBC with Differential  Result Value Ref Range   WBC 9.1 3.9 - 10.3 10e3/uL   NEUT# 6.6 (H) 1.5 - 6.5 10e3/uL   HGB 12.5 11.6 - 15.9 g/dL   HCT 38.8 34.8 - 46.6 %   Platelets 218 145 - 400 10e3/uL   MCV 94.6 79.5 - 101.0 fL   MCH 30.4 25.1 - 34.0 pg   MCHC 32.2 31.5 - 36.0 g/dL   RBC 4.11 3.70 - 5.45 10e6/uL   RDW 14.0  11.2 - 14.5 %   lymph# 1.4 0.9 - 3.3 10e3/uL   MONO# 0.9 0.1 - 0.9 10e3/uL   Eosinophils Absolute 0.2 0.0 - 0.5 10e3/uL   Basophils Absolute 0.0 0.0 - 0.1 10e3/uL   NEUT% 72.5 38.4 - 76.8 %   LYMPH% 15.0 14.0 - 49.7 %   MONO% 10.0 0.0 - 14.0 %   EOS% 2.0 0.0 - 7.0 %   BASO% 0.5 0.0 - 2.0 %  Comprehensive metabolic panel ([facility])  Result Value Ref Range   Sodium 143 136 - 145 mEq/L   Potassium 3.9 3.5 - 5.1 mEq/L   Chloride 106 98 - 109 mEq/L   CO2 26 22 - 29 mEq/L   Glucose 88 70 - 140 mg/dl   BUN 20.5 7.0 - 26.0 mg/dL   Creatinine 0.8 0.6 - 1.1 mg/dL   Total Bilirubin 0.46 0.20 - 1.20 mg/dL   Alkaline  Phosphatase 44 40 - 150 U/L   AST 21 5 - 34 U/L   ALT 12 0 - 55 U/L   Total Protein 6.5 6.4 - 8.3 g/dL   Albumin 3.6 3.5 - 5.0 g/dL   Calcium 9.3 8.4 - 10.4 mg/dL   Anion Gap 11 3 - 11 mEq/L   EGFR 75 (L) >90 ml/min/1.73 m2     Studies/Results:   PATHOLOGY   Annabell Sabal B Collected: 06/21/2014 Client: [facility] Accession: [id number] Received: 06/21/2014 Markus Daft DOB: 12-May-1941 Age: 16 Gender: F Reported: 06/23/2014 [address] PATHOLOGY FINAL DIAGNOSIS Diagnosis Bone, biopsy, left lumbar paraspinal - METASTATIC POORLY DIFFERENTIATED CARCINOMA, CONSISTENT WITH HIGH GRADE SEROUS CARCINOMA SEE COMMENT. Microscopic Comment The carcinoma demonstrates the following immunophenotype: Cytokeratin 7 - patchy moderate to strong expression Estrogen receptor - negative expression P53 - strong diffuse expression TTF-1 - negative expression WT-1 - negative expression CD56 - focal moderate strong expression Synaptophysin - negative expression GCDFP - negative expression The history of primary endometrial papillary serous carcinoma and primary mammary carcinoma is noted. In the current case, the overall morphologic and immunophenotype are that of poorly differentiated carcinoma, consistent with high grade serous carcinoma.     IMAGING  NUCLEAR MEDICINE WHOLE BODY BONE SCAN  TECHNIQUE: Whole body anterior and posterior images were obtained approximately 3 hours after intravenous injection of radiopharmaceutical.  RADIOPHARMACEUTICALS: Twenty-five mCi Technetium-99 MDP  COMPARISON: MRI of the lumbar spine of May 13, 2014  FINDINGS: There is adequate uptake of the radiopharmaceutical by the skeleton. There is adequate soft tissue clearance and renal activity. Uptake over the calvarium is normal. There is focally increased uptake posteriorly in the upper lumbar spine at approximately L2 on the right. Uptake over the pectoral girdle is normal. Uptake  over the pelvis and lower extremities is normal with exception of a subtle focus of increased uptake in the greater trochanter of the right femur.  IMPRESSION: 1. Mildly increased uptake in the posterior elements of approximately L2. On the MRI of June 13, 2014 degenerative disc and facet joint change at L1-2 was noted here. 2. Subtle increased uptake in the greater trochanter of the right hip. Plain films are recommended.  I have reviewed bone scan findings and images on PACs with patient and family now.   Medications: I have reviewed the patient's current medications. Pain medication was from orthopedist, can be refilled from this office or rad onc/ gyn onc if needed.   DISCUSSION: Patient and family have numerous questions and concerns, which we have addressed in detail. All of information above  reviewed concerning the progression of metastatic endometrial cancer in lumbar spine region, including involvement of cortex of L3 vertebral body and pedicle of L4. I have explained that radiation is often very helpful with localized areas of involvement, including with pain. We have discussed hopefully tapering down pain medication if symptoms begin to improve towards end of radiation, or after completion. We have discussed rationale for IV bisphosphonate and mechanism of action as well as possible side effects and precautions with invasive dental procedures. She has had teaching for zometa by RN prior to visit today, and she and family (including husband, who is pharmacist) are in agreement with using this. She understands that the zometa is not an urgent intervention now, however she is glad to have this started today. I expect that we will use the zometa monthly for 6-9 months if no other concerns, then may lengthen interval for treatments. She is aware of restaging CTs planned by Dr Alycia Rossetti just after completion of RT. I have told them that generally interventions are not curative with recurrent  metastatic gyn cancer, but that chances are very good that she will get significant benefit from the radiation and hopefully resume good quality of life. Recommended gatorade or equivalent this afternoon after zometa.  Patient and family have had all questions answered to their satisfaction and are in agreement with plans and recommendations as above.  Assessment/Plan:  1.Papillary serous endometrial carcinoma IB at diagnosis Aug 2009, recurrent at left pelvic sidewall 03-2011 and progression now in area of left lumbar spine: additional treatment history as above. Radiation to begin next week, pain controlled with present medications. Will begin IV bisphosphonate as zometa today. I will see her back in ~ 3 weeks, coordinating with RT, and will use the zometa monthly for now.  2.T1N0 right breast cancer 03-1999: adjuvant therapy included 5 years of tamoxifen. Not recurrent. Up to date on mammograms, tomo medically appropriate due to density of breast tissue 3.scoliosis of lumbar spine with degenerative disc disease 4.HTN followed by Dr Virgina Jock, previously by Dr Mare Ferrari  Time spent 40 min including >50% counseling and coordination of care. Zometa orders confirmed. Cc this note to Drs Alycia Rossetti, Marlyn Corporal. Patient knows that she can call if needed prior to next scheduled appointment.    Gordy Levan, MD   07/07/2014, 9:33 PM

## 2014-07-07 NOTE — Telephone Encounter (Signed)
per pof to sch pt appt-gave pt copy of sch-pt stated okay to sch @4 :30

## 2014-07-11 ENCOUNTER — Ambulatory Visit
Admission: RE | Admit: 2014-07-11 | Discharge: 2014-07-11 | Disposition: A | Discharge status: Home / Self Care | Payer: Medicare Other | Source: Ambulatory Visit | Attending: Radiation Oncology | Admitting: Radiation Oncology

## 2014-07-11 DIAGNOSIS — Z510 Encounter for antineoplastic radiation therapy: Secondary | ICD-10-CM | POA: Diagnosis not present

## 2014-07-11 DIAGNOSIS — C541 Malignant neoplasm of endometrium: Secondary | ICD-10-CM | POA: Diagnosis not present

## 2014-07-11 DIAGNOSIS — Z923 Personal history of irradiation: Secondary | ICD-10-CM | POA: Diagnosis not present

## 2014-07-11 DIAGNOSIS — N393 Stress incontinence (female) (male): Secondary | ICD-10-CM | POA: Diagnosis not present

## 2014-07-11 DIAGNOSIS — M545 Low back pain: Secondary | ICD-10-CM | POA: Diagnosis not present

## 2014-07-11 DIAGNOSIS — C7951 Secondary malignant neoplasm of bone: Secondary | ICD-10-CM | POA: Diagnosis not present

## 2014-07-12 ENCOUNTER — Ambulatory Visit
Admission: RE | Admit: 2014-07-12 | Discharge: 2014-07-12 | Disposition: A | Discharge status: Home / Self Care | Payer: Medicare Other | Source: Ambulatory Visit | Attending: Radiation Oncology | Admitting: Radiation Oncology

## 2014-07-12 ENCOUNTER — Encounter: Payer: Self-pay | Admitting: Radiation Oncology

## 2014-07-12 VITALS — BP 179/72 | HR 61 | Temp 98.1°F | Resp 12 | Ht 59.0 in | Wt 108.9 lb

## 2014-07-12 DIAGNOSIS — M545 Low back pain: Secondary | ICD-10-CM | POA: Diagnosis not present

## 2014-07-12 DIAGNOSIS — Z923 Personal history of irradiation: Secondary | ICD-10-CM | POA: Diagnosis not present

## 2014-07-12 DIAGNOSIS — N393 Stress incontinence (female) (male): Secondary | ICD-10-CM | POA: Diagnosis not present

## 2014-07-12 DIAGNOSIS — C7951 Secondary malignant neoplasm of bone: Secondary | ICD-10-CM | POA: Diagnosis not present

## 2014-07-12 DIAGNOSIS — Z510 Encounter for antineoplastic radiation therapy: Secondary | ICD-10-CM | POA: Diagnosis not present

## 2014-07-12 DIAGNOSIS — C541 Malignant neoplasm of endometrium: Secondary | ICD-10-CM

## 2014-07-12 NOTE — Progress Notes (Signed)
[name] has completed 2 fractions to her lumbar spine.  She denies pain today.  She is taking oxycodone 10 mg 2-3 times a day.  She denies nausea.  Her BP today was elevated at 179/72.  She said she takes it at home 120/70.  She has been given the Radiation Therapy and You book and reviewed potential side effects including nausea, vomiting, diarrhea and fatigue.  She was encouraged to call with any concerns.  BP 179/72 mmHg  Pulse 61  Temp(Src) 98.1 F (36.7 C) (Oral)  Resp 12  Ht 4\' 11"  (1.499 m)  Wt 108 lb 14.4 oz (49.397 kg)  BMI 21.98 kg/m2

## 2014-07-12 NOTE — Progress Notes (Signed)
  Radiation Oncology         [phone] ________________________________  Name: [name] MRN: [id number]  Date: 07/12/2014  DOB: 1941-04-12  Weekly Radiation Therapy Management  DIAGNOSIS: Recurrent papillary serous carcinoma of the uterus, isolated recurrence in the left lumbar paraspinal area   Current Dose: 4.4 Gy     Planned Dose:  55 Gy  Narrative . . . . . . . . The patient presents for routine under treatment assessment.                                   The patient is without complaint. For nausea or bowel complaints with her treatments thus far no nausea or bowel complaints with her treatments thus far.                                 Set-up films were reviewed.                                 The chart was checked. Physical Findings. . .  height is 4\' 11"  (1.499 m) and weight is 108 lb 14.4 oz (49.397 kg). Her oral temperature is 98.1 F (36.7 C). Her blood pressure is 179/72 and her pulse is 61. Her respiration is 12. . the lungs are clear. The heart has a regular rhythm and rate. The abdomen is soft and nontender with normal bowel sounds. Impression . . . . . . . The patient is tolerating radiation. Plan . . . . . . . . . . . . Continue treatment as planned.  ________________________________   Blair Promise, PhD, MD

## 2014-07-13 ENCOUNTER — Ambulatory Visit
Admission: RE | Admit: 2014-07-13 | Discharge: 2014-07-13 | Disposition: A | Discharge status: Home / Self Care | Payer: Medicare Other | Source: Ambulatory Visit | Attending: Radiation Oncology | Admitting: Radiation Oncology

## 2014-07-13 DIAGNOSIS — C541 Malignant neoplasm of endometrium: Secondary | ICD-10-CM | POA: Diagnosis not present

## 2014-07-13 DIAGNOSIS — C7951 Secondary malignant neoplasm of bone: Secondary | ICD-10-CM | POA: Diagnosis not present

## 2014-07-13 DIAGNOSIS — Z923 Personal history of irradiation: Secondary | ICD-10-CM | POA: Diagnosis not present

## 2014-07-13 DIAGNOSIS — Z510 Encounter for antineoplastic radiation therapy: Secondary | ICD-10-CM | POA: Diagnosis not present

## 2014-07-13 DIAGNOSIS — M545 Low back pain: Secondary | ICD-10-CM | POA: Diagnosis not present

## 2014-07-13 DIAGNOSIS — N393 Stress incontinence (female) (male): Secondary | ICD-10-CM | POA: Diagnosis not present

## 2014-07-14 ENCOUNTER — Ambulatory Visit
Admission: RE | Admit: 2014-07-14 | Discharge: 2014-07-14 | Disposition: A | Discharge status: Home / Self Care | Payer: Medicare Other | Source: Ambulatory Visit | Attending: Radiation Oncology | Admitting: Radiation Oncology

## 2014-07-14 DIAGNOSIS — M545 Low back pain: Secondary | ICD-10-CM | POA: Diagnosis not present

## 2014-07-14 DIAGNOSIS — N393 Stress incontinence (female) (male): Secondary | ICD-10-CM | POA: Diagnosis not present

## 2014-07-14 DIAGNOSIS — Z510 Encounter for antineoplastic radiation therapy: Secondary | ICD-10-CM | POA: Diagnosis not present

## 2014-07-14 DIAGNOSIS — C7951 Secondary malignant neoplasm of bone: Secondary | ICD-10-CM | POA: Diagnosis not present

## 2014-07-14 DIAGNOSIS — C541 Malignant neoplasm of endometrium: Secondary | ICD-10-CM | POA: Diagnosis not present

## 2014-07-14 DIAGNOSIS — Z923 Personal history of irradiation: Secondary | ICD-10-CM | POA: Diagnosis not present

## 2014-07-15 ENCOUNTER — Ambulatory Visit
Admission: RE | Admit: 2014-07-15 | Discharge: 2014-07-15 | Disposition: A | Discharge status: Home / Self Care | Payer: Medicare Other | Source: Ambulatory Visit | Attending: Radiation Oncology | Admitting: Radiation Oncology

## 2014-07-15 DIAGNOSIS — M545 Low back pain: Secondary | ICD-10-CM | POA: Diagnosis not present

## 2014-07-15 DIAGNOSIS — Z923 Personal history of irradiation: Secondary | ICD-10-CM | POA: Diagnosis not present

## 2014-07-15 DIAGNOSIS — Z510 Encounter for antineoplastic radiation therapy: Secondary | ICD-10-CM | POA: Diagnosis not present

## 2014-07-15 DIAGNOSIS — C7951 Secondary malignant neoplasm of bone: Secondary | ICD-10-CM | POA: Diagnosis not present

## 2014-07-15 DIAGNOSIS — C541 Malignant neoplasm of endometrium: Secondary | ICD-10-CM | POA: Diagnosis not present

## 2014-07-15 DIAGNOSIS — N393 Stress incontinence (female) (male): Secondary | ICD-10-CM | POA: Diagnosis not present

## 2014-07-18 ENCOUNTER — Ambulatory Visit
Admission: RE | Admit: 2014-07-18 | Discharge: 2014-07-18 | Disposition: A | Discharge status: Home / Self Care | Payer: Medicare Other | Source: Ambulatory Visit | Attending: Radiation Oncology | Admitting: Radiation Oncology

## 2014-07-18 DIAGNOSIS — C7951 Secondary malignant neoplasm of bone: Secondary | ICD-10-CM | POA: Diagnosis not present

## 2014-07-18 DIAGNOSIS — Z923 Personal history of irradiation: Secondary | ICD-10-CM | POA: Diagnosis not present

## 2014-07-18 DIAGNOSIS — C541 Malignant neoplasm of endometrium: Secondary | ICD-10-CM | POA: Diagnosis not present

## 2014-07-18 DIAGNOSIS — Z510 Encounter for antineoplastic radiation therapy: Secondary | ICD-10-CM | POA: Diagnosis not present

## 2014-07-18 DIAGNOSIS — M545 Low back pain: Secondary | ICD-10-CM | POA: Diagnosis not present

## 2014-07-18 DIAGNOSIS — N393 Stress incontinence (female) (male): Secondary | ICD-10-CM | POA: Diagnosis not present

## 2014-07-19 ENCOUNTER — Telehealth: Payer: Self-pay | Admitting: Oncology

## 2014-07-19 ENCOUNTER — Encounter: Payer: Self-pay | Admitting: Radiation Oncology

## 2014-07-19 ENCOUNTER — Ambulatory Visit
Admission: RE | Admit: 2014-07-19 | Discharge: 2014-07-19 | Disposition: A | Discharge status: Home / Self Care | Payer: Medicare Other | Source: Ambulatory Visit | Attending: Radiation Oncology | Admitting: Radiation Oncology

## 2014-07-19 VITALS — BP 154/70 | HR 67 | Resp 16 | Wt 108.3 lb

## 2014-07-19 DIAGNOSIS — C7951 Secondary malignant neoplasm of bone: Secondary | ICD-10-CM | POA: Diagnosis not present

## 2014-07-19 DIAGNOSIS — N393 Stress incontinence (female) (male): Secondary | ICD-10-CM | POA: Diagnosis not present

## 2014-07-19 DIAGNOSIS — M545 Low back pain: Secondary | ICD-10-CM | POA: Diagnosis not present

## 2014-07-19 DIAGNOSIS — C549 Malignant neoplasm of corpus uteri, unspecified: Secondary | ICD-10-CM

## 2014-07-19 DIAGNOSIS — C541 Malignant neoplasm of endometrium: Secondary | ICD-10-CM | POA: Diagnosis not present

## 2014-07-19 DIAGNOSIS — Z510 Encounter for antineoplastic radiation therapy: Secondary | ICD-10-CM | POA: Diagnosis not present

## 2014-07-19 DIAGNOSIS — Z923 Personal history of irradiation: Secondary | ICD-10-CM | POA: Diagnosis not present

## 2014-07-19 NOTE — Progress Notes (Signed)
  Radiation Oncology         [phone] ________________________________  Name: NORINNE JEANE MRN: [id number]  Date: 07/19/2014  DOB: 1941/02/05  Weekly Radiation Therapy Management  DIAGNOSIS: Recurrent papillary serous carcinoma of the uterus, isolated recurrence in the left lumbar paraspinal area   Current Dose: 15.4 Gy     Planned Dose:  55 Gy  Narrative . . . . . . . . The patient presents for routine under treatment assessment.                                   The patient is without complaint. She denies any nausea. She has actually noted a slight improvement in her pain thus far.                                 Set-up films were reviewed.                                 The chart was checked. Physical Findings. . .  weight is 108 lb 4.8 oz (49.125 kg). Her blood pressure is 154/70 and her pulse is 67. Her respiration is 16. . The lungs are clear. The heart has a regular rhythm and rate. The abdomen is soft and nontender with normal bowel sounds. Impression . . . . . . . The patient is tolerating radiation. Plan . . . . . . . . . . . . Continue treatment as planned.  ________________________________   Blair Promise, PhD, MD

## 2014-07-19 NOTE — Telephone Encounter (Signed)
Moved pts md appt to 3/18 per message from dr Lonna Cobb Lm with new appt info    [name]

## 2014-07-19 NOTE — Progress Notes (Signed)
Patient reports lumbar spine pain may have slightly improved. Explains she didn't have to take any pain medication until late yesterday. Reports occasionally her back pain will radiate down her left femur. Additionally, she reports occasional numbness in her left knee. Steady gait noted. Slight elevation of bp noted. Patient reports bp was elevated last night when she assessed it at home. Encouraged her to log bp for one week and contact PCP if it continued to stay elevated.

## 2014-07-20 ENCOUNTER — Ambulatory Visit
Admission: RE | Admit: 2014-07-20 | Discharge: 2014-07-20 | Disposition: A | Discharge status: Home / Self Care | Payer: Medicare Other | Source: Ambulatory Visit | Attending: Radiation Oncology | Admitting: Radiation Oncology

## 2014-07-20 DIAGNOSIS — C7951 Secondary malignant neoplasm of bone: Secondary | ICD-10-CM | POA: Diagnosis not present

## 2014-07-20 DIAGNOSIS — Z510 Encounter for antineoplastic radiation therapy: Secondary | ICD-10-CM | POA: Diagnosis not present

## 2014-07-20 DIAGNOSIS — N393 Stress incontinence (female) (male): Secondary | ICD-10-CM | POA: Diagnosis not present

## 2014-07-20 DIAGNOSIS — C541 Malignant neoplasm of endometrium: Secondary | ICD-10-CM | POA: Diagnosis not present

## 2014-07-20 DIAGNOSIS — M545 Low back pain: Secondary | ICD-10-CM | POA: Diagnosis not present

## 2014-07-20 DIAGNOSIS — Z923 Personal history of irradiation: Secondary | ICD-10-CM | POA: Diagnosis not present

## 2014-07-21 ENCOUNTER — Ambulatory Visit
Admission: RE | Admit: 2014-07-21 | Discharge: 2014-07-21 | Disposition: A | Discharge status: Home / Self Care | Payer: Medicare Other | Source: Ambulatory Visit | Attending: Radiation Oncology | Admitting: Radiation Oncology

## 2014-07-21 DIAGNOSIS — C541 Malignant neoplasm of endometrium: Secondary | ICD-10-CM | POA: Diagnosis not present

## 2014-07-21 DIAGNOSIS — Z510 Encounter for antineoplastic radiation therapy: Secondary | ICD-10-CM | POA: Diagnosis not present

## 2014-07-21 DIAGNOSIS — M545 Low back pain: Secondary | ICD-10-CM | POA: Diagnosis not present

## 2014-07-21 DIAGNOSIS — N393 Stress incontinence (female) (male): Secondary | ICD-10-CM | POA: Diagnosis not present

## 2014-07-21 DIAGNOSIS — C7951 Secondary malignant neoplasm of bone: Secondary | ICD-10-CM | POA: Diagnosis not present

## 2014-07-21 DIAGNOSIS — Z923 Personal history of irradiation: Secondary | ICD-10-CM | POA: Diagnosis not present

## 2014-07-22 ENCOUNTER — Ambulatory Visit
Admission: RE | Admit: 2014-07-22 | Discharge: 2014-07-22 | Disposition: A | Discharge status: Home / Self Care | Payer: Medicare Other | Source: Ambulatory Visit | Attending: Radiation Oncology | Admitting: Radiation Oncology

## 2014-07-22 DIAGNOSIS — Z510 Encounter for antineoplastic radiation therapy: Secondary | ICD-10-CM | POA: Diagnosis not present

## 2014-07-22 DIAGNOSIS — M545 Low back pain: Secondary | ICD-10-CM | POA: Diagnosis not present

## 2014-07-22 DIAGNOSIS — C541 Malignant neoplasm of endometrium: Secondary | ICD-10-CM | POA: Diagnosis not present

## 2014-07-22 DIAGNOSIS — N393 Stress incontinence (female) (male): Secondary | ICD-10-CM | POA: Diagnosis not present

## 2014-07-22 DIAGNOSIS — C7951 Secondary malignant neoplasm of bone: Secondary | ICD-10-CM | POA: Diagnosis not present

## 2014-07-22 DIAGNOSIS — Z923 Personal history of irradiation: Secondary | ICD-10-CM | POA: Diagnosis not present

## 2014-07-24 ENCOUNTER — Other Ambulatory Visit: Payer: Self-pay | Admitting: Oncology

## 2014-07-25 ENCOUNTER — Ambulatory Visit
Admission: RE | Admit: 2014-07-25 | Discharge: 2014-07-25 | Disposition: A | Discharge status: Home / Self Care | Payer: Medicare Other | Source: Ambulatory Visit | Attending: Radiation Oncology | Admitting: Radiation Oncology

## 2014-07-25 DIAGNOSIS — Z510 Encounter for antineoplastic radiation therapy: Secondary | ICD-10-CM | POA: Diagnosis not present

## 2014-07-25 DIAGNOSIS — C7951 Secondary malignant neoplasm of bone: Secondary | ICD-10-CM | POA: Diagnosis not present

## 2014-07-25 DIAGNOSIS — C541 Malignant neoplasm of endometrium: Secondary | ICD-10-CM | POA: Diagnosis not present

## 2014-07-25 DIAGNOSIS — N393 Stress incontinence (female) (male): Secondary | ICD-10-CM | POA: Diagnosis not present

## 2014-07-25 DIAGNOSIS — Z923 Personal history of irradiation: Secondary | ICD-10-CM | POA: Diagnosis not present

## 2014-07-25 DIAGNOSIS — M545 Low back pain: Secondary | ICD-10-CM | POA: Diagnosis not present

## 2014-07-26 ENCOUNTER — Ambulatory Visit
Admission: RE | Admit: 2014-07-26 | Discharge: 2014-07-26 | Disposition: A | Discharge status: Home / Self Care | Payer: Medicare Other | Source: Ambulatory Visit | Attending: Radiation Oncology | Admitting: Radiation Oncology

## 2014-07-26 ENCOUNTER — Encounter: Payer: Self-pay | Admitting: Radiation Oncology

## 2014-07-26 VITALS — BP 163/65 | HR 64 | Temp 98.4°F | Resp 16 | Ht 59.0 in | Wt 107.8 lb

## 2014-07-26 DIAGNOSIS — M545 Low back pain: Secondary | ICD-10-CM | POA: Diagnosis not present

## 2014-07-26 DIAGNOSIS — Z510 Encounter for antineoplastic radiation therapy: Secondary | ICD-10-CM | POA: Diagnosis not present

## 2014-07-26 DIAGNOSIS — C7951 Secondary malignant neoplasm of bone: Secondary | ICD-10-CM | POA: Diagnosis not present

## 2014-07-26 DIAGNOSIS — N393 Stress incontinence (female) (male): Secondary | ICD-10-CM | POA: Diagnosis not present

## 2014-07-26 DIAGNOSIS — C549 Malignant neoplasm of corpus uteri, unspecified: Secondary | ICD-10-CM

## 2014-07-26 DIAGNOSIS — C541 Malignant neoplasm of endometrium: Secondary | ICD-10-CM | POA: Diagnosis not present

## 2014-07-26 DIAGNOSIS — Z923 Personal history of irradiation: Secondary | ICD-10-CM | POA: Diagnosis not present

## 2014-07-26 NOTE — Progress Notes (Signed)
Leianne Callins has completed 12 fractions to her L spine.  She denies pain today and reports her back pain has improved.  She does take 1 aleve per day and oxycodone occasionally.  She reports her appetite is not as a good.  She denies nausea. Her weight is down 1 lb from last week.  She denies skin irritation.  She had one episode of diarrhea last week.  Her bp was elevated today at 163/65.  She has been taking it at home and says it runs at 116/70.  BP 163/65 mmHg  Pulse 64  Temp(Src) 98.4 F (36.9 C) (Oral)  Resp 16  Ht 4\' 11"  (1.499 m)  Wt 107 lb 12.8 oz (48.898 kg)  BMI 21.76 kg/m2

## 2014-07-26 NOTE — Progress Notes (Signed)
Weekly Management Note Current Dose: 26.4 Gy  Projected Dose:55 Gy   Narrative:  The patient presents for routine under treatment assessment.  CBCT/MVCT images/Port film x-rays were reviewed.  The chart was checked. Doing well. No complaints. Taking minimal pain medications. Minimal diarrhea.   Physical Findings:  Alert and oriented.  Vitals:  Filed Vitals:   07/26/14 0900  BP: 163/65  Pulse: 64  Temp: 98.4 F (36.9 C)  Resp: 16   Weight:  Wt Readings from Last 3 Encounters:  07/07/14 109 lb 3.2 oz (49.533 kg)  06/09/14 111 lb 12.8 oz (50.712 kg)  02/17/14 111 lb 1.6 oz (50.395 kg)   Lab Results  Component Value Date   WBC 9.1 07/07/2014   HGB 12.5 07/07/2014   HCT 38.8 07/07/2014   MCV 94.6 07/07/2014   PLT 218 07/07/2014   Lab Results  Component Value Date   CREATININE 0.8 07/07/2014   BUN 20.5 07/07/2014   NA 143 07/07/2014   K 3.9 07/07/2014   CL 95* 05/11/2013   CO2 26 07/07/2014     Impression:  The patient is tolerating radiation.  Plan:  Continue treatment as planned.

## 2014-07-27 ENCOUNTER — Ambulatory Visit
Admission: RE | Admit: 2014-07-27 | Discharge: 2014-07-27 | Disposition: A | Discharge status: Home / Self Care | Payer: Medicare Other | Source: Ambulatory Visit | Attending: Radiation Oncology | Admitting: Radiation Oncology

## 2014-07-27 DIAGNOSIS — Z510 Encounter for antineoplastic radiation therapy: Secondary | ICD-10-CM | POA: Diagnosis not present

## 2014-07-27 DIAGNOSIS — N393 Stress incontinence (female) (male): Secondary | ICD-10-CM | POA: Diagnosis not present

## 2014-07-27 DIAGNOSIS — C7951 Secondary malignant neoplasm of bone: Secondary | ICD-10-CM | POA: Diagnosis not present

## 2014-07-27 DIAGNOSIS — Z923 Personal history of irradiation: Secondary | ICD-10-CM | POA: Diagnosis not present

## 2014-07-27 DIAGNOSIS — C541 Malignant neoplasm of endometrium: Secondary | ICD-10-CM | POA: Diagnosis not present

## 2014-07-27 DIAGNOSIS — M545 Low back pain: Secondary | ICD-10-CM | POA: Diagnosis not present

## 2014-07-28 ENCOUNTER — Ambulatory Visit
Admission: RE | Admit: 2014-07-28 | Discharge: 2014-07-28 | Disposition: A | Discharge status: Home / Self Care | Payer: Medicare Other | Source: Ambulatory Visit | Attending: Radiation Oncology | Admitting: Radiation Oncology

## 2014-07-28 ENCOUNTER — Ambulatory Visit: Payer: Medicare Other | Admitting: Oncology

## 2014-07-28 ENCOUNTER — Other Ambulatory Visit (HOSPITAL_BASED_OUTPATIENT_CLINIC_OR_DEPARTMENT_OTHER): Payer: Medicare Other

## 2014-07-28 DIAGNOSIS — C541 Malignant neoplasm of endometrium: Secondary | ICD-10-CM | POA: Diagnosis not present

## 2014-07-28 DIAGNOSIS — Z923 Personal history of irradiation: Secondary | ICD-10-CM | POA: Diagnosis not present

## 2014-07-28 DIAGNOSIS — M545 Low back pain: Secondary | ICD-10-CM | POA: Diagnosis not present

## 2014-07-28 DIAGNOSIS — N393 Stress incontinence (female) (male): Secondary | ICD-10-CM | POA: Diagnosis not present

## 2014-07-28 DIAGNOSIS — C7951 Secondary malignant neoplasm of bone: Secondary | ICD-10-CM

## 2014-07-28 DIAGNOSIS — Z510 Encounter for antineoplastic radiation therapy: Secondary | ICD-10-CM | POA: Diagnosis not present

## 2014-07-28 LAB — CBC WITH DIFFERENTIAL/PLATELET
BASO%: 1 % (ref 0.0–2.0)
Basophils Absolute: 0 10*3/uL (ref 0.0–0.1)
EOS%: 3.5 % (ref 0.0–7.0)
Eosinophils Absolute: 0.1 10*3/uL (ref 0.0–0.5)
HCT: 34.6 % — ABNORMAL LOW (ref 34.8–46.6)
HGB: 11.2 g/dL — ABNORMAL LOW (ref 11.6–15.9)
LYMPH%: 14.2 % (ref 14.0–49.7)
MCH: 30.6 pg (ref 25.1–34.0)
MCHC: 32.3 g/dL (ref 31.5–36.0)
MCV: 94.7 fL (ref 79.5–101.0)
MONO#: 0.7 10*3/uL (ref 0.1–0.9)
MONO%: 15.5 % — ABNORMAL HIGH (ref 0.0–14.0)
NEUT#: 2.8 10*3/uL (ref 1.5–6.5)
NEUT%: 65.8 % (ref 38.4–76.8)
Platelets: 175 10*3/uL (ref 145–400)
RBC: 3.65 10*6/uL — ABNORMAL LOW (ref 3.70–5.45)
RDW: 13.9 % (ref 11.2–14.5)
WBC: 4.2 10*3/uL (ref 3.9–10.3)
lymph#: 0.6 10*3/uL — ABNORMAL LOW (ref 0.9–3.3)

## 2014-07-28 LAB — COMPREHENSIVE METABOLIC PANEL (CC13)
ALT: 18 U/L (ref 0–55)
AST: 22 U/L (ref 5–34)
Albumin: 3.3 g/dL — ABNORMAL LOW (ref 3.5–5.0)
Alkaline Phosphatase: 38 U/L — ABNORMAL LOW (ref 40–150)
Anion Gap: 9 mEq/L (ref 3–11)
BUN: 15.8 mg/dL (ref 7.0–26.0)
CO2: 26 mEq/L (ref 22–29)
Calcium: 8.7 mg/dL (ref 8.4–10.4)
Chloride: 108 mEq/L (ref 98–109)
Creatinine: 0.7 mg/dL (ref 0.6–1.1)
EGFR: 86 mL/min/{1.73_m2} — ABNORMAL LOW (ref >90)
Glucose: 82 mg/dl (ref 70–140)
Potassium: 4 mEq/L (ref 3.5–5.1)
Sodium: 143 mEq/L (ref 136–145)
Total Bilirubin: 0.58 mg/dL (ref 0.20–1.20)
Total Protein: 6.3 g/dL — ABNORMAL LOW (ref 6.4–8.3)

## 2014-07-29 ENCOUNTER — Ambulatory Visit (HOSPITAL_BASED_OUTPATIENT_CLINIC_OR_DEPARTMENT_OTHER): Payer: Medicare Other | Admitting: Oncology

## 2014-07-29 ENCOUNTER — Encounter: Payer: Self-pay | Admitting: Oncology

## 2014-07-29 ENCOUNTER — Telehealth: Payer: Self-pay | Admitting: Oncology

## 2014-07-29 ENCOUNTER — Ambulatory Visit
Admission: RE | Admit: 2014-07-29 | Discharge: 2014-07-29 | Disposition: A | Discharge status: Home / Self Care | Payer: Medicare Other | Source: Ambulatory Visit | Attending: Radiation Oncology | Admitting: Radiation Oncology

## 2014-07-29 VITALS — BP 163/64 | HR 63 | Temp 97.8°F | Resp 18 | Ht 59.0 in | Wt 106.9 lb

## 2014-07-29 DIAGNOSIS — G893 Neoplasm related pain (acute) (chronic): Secondary | ICD-10-CM | POA: Diagnosis not present

## 2014-07-29 DIAGNOSIS — Z923 Personal history of irradiation: Secondary | ICD-10-CM | POA: Diagnosis not present

## 2014-07-29 DIAGNOSIS — C541 Malignant neoplasm of endometrium: Secondary | ICD-10-CM | POA: Diagnosis not present

## 2014-07-29 DIAGNOSIS — N393 Stress incontinence (female) (male): Secondary | ICD-10-CM | POA: Diagnosis not present

## 2014-07-29 DIAGNOSIS — Z510 Encounter for antineoplastic radiation therapy: Secondary | ICD-10-CM | POA: Diagnosis not present

## 2014-07-29 DIAGNOSIS — Z853 Personal history of malignant neoplasm of breast: Secondary | ICD-10-CM | POA: Diagnosis not present

## 2014-07-29 DIAGNOSIS — M545 Low back pain: Secondary | ICD-10-CM | POA: Diagnosis not present

## 2014-07-29 DIAGNOSIS — C7951 Secondary malignant neoplasm of bone: Secondary | ICD-10-CM | POA: Diagnosis not present

## 2014-07-29 NOTE — Telephone Encounter (Signed)
appoinments made and avs printed for p,note to [facility] to ck gehrig appt and pt aware   anne

## 2014-07-29 NOTE — Telephone Encounter (Signed)
Called and spoke with patients husband and he is aware of the new chemo time on 3/24 and called also with dr Joesph Fillers new time on 4/28 he will advise her and will most Likely keep the scan appointment as scheduled   anne

## 2014-07-29 NOTE — Progress Notes (Signed)
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 Radiation (IMRT) is planned thru 08-12-14; we will continue monthly zometa for now. She is to have further staging with CTs prior to seeing Dr Alycia Rossetti on 08-18-14.  Patient can tell improvement already in LLE neuropathic pain, tho still discomfort at left knee. She has not used prn oxycodone since last week, but is still taking gabapentin regularly and naproxen bid with food. She is not drowsy from gabapentin now. I have suggested that, if the knee pain persists beyond a few weeks after completion of RT, she may benefit from orthopedic evaluation of the knee. She did well with zometa, no side effects noticed. Bowels are moving more regularly, no diarrhea. She has some fatigue, and did not sleep last pm as husband was having problems with diabetes then.  No PAC Flu vaccine done   ONCOLOGIC HISTORY BREAST CANCER: microinvasive T1N0 right breast cancer which was ER/PR positive and HER-2 2+ with insufficient material for FISH at diagnosis in 03/1999, treated with lumpectomy with 2 sentinel node evaluation, local radiation and 5 years of Tamoxifen thru 05/2004, then aromatase inhibitor 07/2004 thru 04/2006.   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 She had 6 cycles of taxol/carboplatin through Jan 2010, and vaginal cuff brachytherapy. She did well until some vague LUQ symptoms in Nov. 2012, with CT AP in [facility] Mar 22, 2011 showing left pelvic sidewall involvement. She had laparoscopic evaluation at [facility] by [place] 04-25-2011, with  finding of dense fibrosis in the area as well as mass encasing obturator nerve and vein as well as internal iliac vasculature; left ureterolysis was performed. No pathology was submitted from that procedure. She received 3 additional cycles of taxol/ carboplatin from 05-16-2011 thru 06-28-2011. CT AP on 07-17-2011 showed improvement in the left pelvic sidewall mass now 1.9 x 2 cm and 3.4 cm cephalocaudad, as compared with 2.5 x 2.2 cm and 5.4 cm on CT 03-22-11, and no progressive disease elsewhere. She saw Dr.Gehrig after the CT, with recommendation for an additonal 3 cycles of same chemotherapy then repeat scan. Cycle 4 was taxol + carbo and neulasta, cycle 5 taxol only due to counts and cycle 6 taxol + carboplatin on 09-17-11. Follow up CT in May 2013 showed enlargement of the left pelvic sidewall involvement. She had IMRT 50 GY in 28 fractions by Dr Sondra Come, that completed 12-05-11. She had regular follow up scans, Including CT AP 01-2014 and PET 02-2013, which did not show progressive disease. She has scoliosis and some chronic intermittent low back discomfort, but developed different, persistent discomfort low back and radiating to LLE by ~ Dec into Jan 2016. MRI lumbar spine at [facility] on 06-13-2014 showed known scoliosis and degenerative disc disease, with new finding of 2.7 x 2.4 x 1.9 cm soft tissue mass at L3-4 involving left neural foramen, with focal cortical destruction of adjacent L3 vertebral body and portion of left L4 pedicle. She had CT biopsy on 06-21-14, with pathology ([id number]) poorly differentiated carcinoma consistent with high grade serous. Bone scan 06-22-14 had uptake posteriorly in upper lumbar spine and a subtle increased area of uptake right greater trochanter of  unclear significance.  IMRT by Dr Sondra Come 2-29 thru 08-12-14. First zometa 07-07-14.    Review of systems as above, also: No fever or symptoms of infection. Appetite is not great, tends to decrease when she is anxious; I  have suggested trying supplements, and she enjoys ice cream, which I have encouraged. No SOB.  Remainder of 10 point Review of Systems negative.  Objective:  Vital signs in last 24 hours:  BP 163/64 mmHg  Pulse 63  Temp(Src) 97.8 F (36.6 C) (Oral)  Resp 18  Ht _0  (1.499 m)  Wt 106 lb 14.4 oz (48.49 kg)  BMI 21.58 kg/m2 Weight is down 1 lb Alert, oriented and appropriate, always extremely pleasant. Ambulatory without assistance. Looks fatigued, respirations not labored, clearly more comfortable sitting and with activity than when I saw her last.  No alopecia  HEENT:PERRL, sclerae not icteric. Oral mucosa a little dry without lesions, posterior pharynx clear.  Neck supple. No JVD.  Lymphatics:no cervical,supraclavicular adenopathy Resp: clear to auscultation bilaterally and normal percussion bilaterally Cardio: regular rate and rhythm. No gallop. GI: soft, nontender, not distended. Normally active bowel sounds.  Musculoskeletal/ Extremities: Scoliosis obvious. Spine not tender to palpation. Extremities without pitting edema, cords, tenderness. No obvious effusion and no heat or erythema left knee. Neuro: speech fluent and appropriate. Strength symmetrical. Skin without rash, ecchymosis, petechiae   Lab Results:  Results for orders placed or performed in visit on 07/28/14  CBC with Differential  Result Value Ref Range   WBC 4.2 3.9 - 10.3 10e3/uL   NEUT# 2.8 1.5 - 6.5 10e3/uL   HGB 11.2 (L) 11.6 - 15.9 g/dL   HCT 34.6 (L) 34.8 - 46.6 %   Platelets 175 145 - 400 10e3/uL   MCV 94.7 79.5 - 101.0 fL   MCH 30.6 25.1 - 34.0 pg   MCHC 32.3 31.5 - 36.0 g/dL   RBC 3.65 (L) 3.70 - 5.45 10e6/uL   RDW 13.9 11.2 - 14.5 %   lymph# 0.6 (L) 0.9 - 3.3 10e3/uL   MONO# 0.7 0.1 - 0.9 10e3/uL   Eosinophils Absolute 0.1 0.0 - 0.5 10e3/uL   Basophils Absolute 0.0 0.0 - 0.1 10e3/uL   NEUT% 65.8 38.4 - 76.8 %   LYMPH% 14.2 14.0 - 49.7 %   MONO% 15.5 (H) 0.0 - 14.0 %   EOS% 3.5 0.0 - 7.0  %   BASO% 1.0 0.0 - 2.0 %  Comprehensive metabolic panel (Cmet) - [facility]  Result Value Ref Range   Sodium 143 136 - 145 mEq/L   Potassium 4.0 3.5 - 5.1 mEq/L   Chloride 108 98 - 109 mEq/L   CO2 26 22 - 29 mEq/L   Glucose 82 70 - 140 mg/dl   BUN 15.8 7.0 - 26.0 mg/dL   Creatinine 0.7 0.6 - 1.1 mg/dL   Total Bilirubin 0.58 0.20 - 1.20 mg/dL   Alkaline Phosphatase 38 (L) 40 - 150 U/L   AST 22 5 - 34 U/L   ALT 18 0 - 55 U/L   Total Protein 6.3 (L) 6.4 - 8.3 g/dL   Albumin 3.3 (L) 3.5 - 5.0 g/dL   Calcium 8.7 8.4 - 10.4 mg/dL   Anion Gap 9 3 - 11 mEq/L   EGFR 86 (L) >90 ml/min/1.73 m2     Studies/Results:  No results found.  Medications: I have reviewed the patient's current medications. Zometa dose adjusted to 3 mg per pharmacy for treatment 07-07-14.  DISCUSSION:  I discussed situation directly with  Dr Alycia Rossetti after last visit. [name] and I are encouraged that radiation is already improving the neuropathic LLE pain. She is in agreement with continuing zometa, due next week. Discussed diet. She is hopeful that she can taper and DC gabapentin after full improvement from RT. She would like to go on beach trip with friends in April after RT completes, which should be fine.   Assessment/Plan:  1.Papillary serous endometrial carcinoma IB at diagnosis Aug 2009, recurrent at left pelvic sidewall 03-2011 and progression now in area of left lumbar spine: Radiation already improving symptoms. Continue monthly zometa at least for next few months. I will see her in ~ 4 weeks or sooner if needed. 2.T1N0 right breast cancer 03-1999: adjuvant therapy included 5 years of tamoxifen. Not recurrent. Up to date on mammograms, tomo medically appropriate due to density of breast tissue 3.scoliosis of lumbar spine with degenerative disc disease 4.HTN followed by Dr Virgina Jock, previously by Dr Mare Ferrari  zometa orders done. All questions answered and patient in agreement with recommendations and plans. Time  spent 25 min including >50% counseling and coordination of care    Melonie Germani P, MD   07/29/2014, 9:59 AM

## 2014-08-01 ENCOUNTER — Ambulatory Visit
Admission: RE | Admit: 2014-08-01 | Discharge: 2014-08-01 | Disposition: A | Discharge status: Home / Self Care | Payer: Medicare Other | Source: Ambulatory Visit | Attending: Radiation Oncology | Admitting: Radiation Oncology

## 2014-08-01 DIAGNOSIS — N393 Stress incontinence (female) (male): Secondary | ICD-10-CM | POA: Diagnosis not present

## 2014-08-01 DIAGNOSIS — C7951 Secondary malignant neoplasm of bone: Secondary | ICD-10-CM | POA: Diagnosis not present

## 2014-08-01 DIAGNOSIS — M545 Low back pain: Secondary | ICD-10-CM | POA: Diagnosis not present

## 2014-08-01 DIAGNOSIS — Z510 Encounter for antineoplastic radiation therapy: Secondary | ICD-10-CM | POA: Diagnosis not present

## 2014-08-01 DIAGNOSIS — C541 Malignant neoplasm of endometrium: Secondary | ICD-10-CM | POA: Diagnosis not present

## 2014-08-01 DIAGNOSIS — Z923 Personal history of irradiation: Secondary | ICD-10-CM | POA: Diagnosis not present

## 2014-08-02 ENCOUNTER — Ambulatory Visit
Admission: RE | Admit: 2014-08-02 | Discharge: 2014-08-02 | Disposition: A | Discharge status: Home / Self Care | Payer: Medicare Other | Source: Ambulatory Visit | Attending: Radiation Oncology | Admitting: Radiation Oncology

## 2014-08-02 ENCOUNTER — Encounter: Payer: Self-pay | Admitting: Radiation Oncology

## 2014-08-02 VITALS — BP 159/68 | HR 61 | Temp 98.3°F | Resp 16 | Ht 59.0 in | Wt 106.5 lb

## 2014-08-02 DIAGNOSIS — Z510 Encounter for antineoplastic radiation therapy: Secondary | ICD-10-CM | POA: Diagnosis not present

## 2014-08-02 DIAGNOSIS — C7951 Secondary malignant neoplasm of bone: Secondary | ICD-10-CM | POA: Diagnosis not present

## 2014-08-02 DIAGNOSIS — C541 Malignant neoplasm of endometrium: Secondary | ICD-10-CM | POA: Diagnosis not present

## 2014-08-02 DIAGNOSIS — Z923 Personal history of irradiation: Secondary | ICD-10-CM | POA: Diagnosis not present

## 2014-08-02 DIAGNOSIS — N393 Stress incontinence (female) (male): Secondary | ICD-10-CM | POA: Diagnosis not present

## 2014-08-02 DIAGNOSIS — M545 Low back pain: Secondary | ICD-10-CM | POA: Diagnosis not present

## 2014-08-02 NOTE — Progress Notes (Signed)
Shawonda Kerce has completed 17 fractions to her lumbar spine.  She reports pain on and off down her left leg.  She is taking gabapentin 3 times a day and naproxen twice a day.  She denies nausea.  She had one episode of diarrhea on Friday.  She reports intermittent cramping in her stomach.  She reports her appetite is not good and has lost 2 lbs in the past two weeks.  She denies fatigue.  Her skin is intact on her lower back.  BP 159/68 mmHg  Pulse 61  Temp(Src) 98.3 F (36.8 C) (Oral)  Resp 16  Ht 4\' 11"  (1.499 m)  Wt 106 lb 8 oz (48.308 kg)  BMI 21.50 kg/m2

## 2014-08-02 NOTE — Progress Notes (Signed)
  Radiation Oncology         [phone] ________________________________  Name: [name] MRN: [id number]  Date: 08/02/2014  DOB: 06-30-1940  Weekly Radiation Therapy Management  DIAGNOSIS: Recurrent papillary serous carcinoma of the uterus, isolated recurrence in the left lumbar paraspinal area   Current Dose: 37.4 Gy     Planned Dose:  55 Gy  Narrative . . . . . . . . The patient presents for routine under treatment assessment.                                   The patient is without complaint. Her appetite is down but she knows the importance of weight maintenance. She denies any actual nausea. She had one episode of diarrhea over the weekend but none since. She does have some mild abdominal cramping. Patient occasionally will have some pain radiating into her left leg but overall her pain has improved with treatment                                 Set-up films were reviewed.                                 The chart was checked. Physical Findings. . .  height is 4\' 11"  (1.499 m) and weight is 106 lb 8 oz (48.308 kg). Her oral temperature is 98.3 F (36.8 C). Her blood pressure is 159/68 and her pulse is 61. Her respiration is 16. . The lungs are clear. The heart has a regular rhythm and rate. The abdomen is soft and nontender with normal bowel sounds. Impression . . . . . . . The patient is tolerating radiation. Plan . . . . . . . . . . . . Continue treatment as planned.  ________________________________   Blair Promise, PhD, MD

## 2014-08-03 ENCOUNTER — Ambulatory Visit
Admission: RE | Admit: 2014-08-03 | Discharge: 2014-08-03 | Disposition: A | Discharge status: Home / Self Care | Payer: Medicare Other | Source: Ambulatory Visit | Attending: Radiation Oncology | Admitting: Radiation Oncology

## 2014-08-03 DIAGNOSIS — C541 Malignant neoplasm of endometrium: Secondary | ICD-10-CM | POA: Diagnosis not present

## 2014-08-03 DIAGNOSIS — Z510 Encounter for antineoplastic radiation therapy: Secondary | ICD-10-CM | POA: Diagnosis not present

## 2014-08-03 DIAGNOSIS — M545 Low back pain: Secondary | ICD-10-CM | POA: Diagnosis not present

## 2014-08-03 DIAGNOSIS — Z923 Personal history of irradiation: Secondary | ICD-10-CM | POA: Diagnosis not present

## 2014-08-03 DIAGNOSIS — C7951 Secondary malignant neoplasm of bone: Secondary | ICD-10-CM | POA: Diagnosis not present

## 2014-08-03 DIAGNOSIS — N393 Stress incontinence (female) (male): Secondary | ICD-10-CM | POA: Diagnosis not present

## 2014-08-04 ENCOUNTER — Ambulatory Visit
Admission: RE | Admit: 2014-08-04 | Discharge: 2014-08-04 | Disposition: A | Discharge status: Home / Self Care | Payer: Medicare Other | Source: Ambulatory Visit | Attending: Radiation Oncology | Admitting: Radiation Oncology

## 2014-08-04 ENCOUNTER — Ambulatory Visit (HOSPITAL_BASED_OUTPATIENT_CLINIC_OR_DEPARTMENT_OTHER): Payer: Medicare Other

## 2014-08-04 DIAGNOSIS — Z510 Encounter for antineoplastic radiation therapy: Secondary | ICD-10-CM | POA: Diagnosis not present

## 2014-08-04 DIAGNOSIS — M545 Low back pain: Secondary | ICD-10-CM | POA: Diagnosis not present

## 2014-08-04 DIAGNOSIS — C7951 Secondary malignant neoplasm of bone: Secondary | ICD-10-CM

## 2014-08-04 DIAGNOSIS — C541 Malignant neoplasm of endometrium: Secondary | ICD-10-CM

## 2014-08-04 DIAGNOSIS — Z923 Personal history of irradiation: Secondary | ICD-10-CM | POA: Diagnosis not present

## 2014-08-04 DIAGNOSIS — N393 Stress incontinence (female) (male): Secondary | ICD-10-CM | POA: Diagnosis not present

## 2014-08-04 MED ORDER — ZOLEDRONIC ACID 4 MG/5ML IV CONC
3.0000 mg | Freq: Once | INTRAVENOUS | Status: AC
  Administered 2014-08-04: 3 mg via INTRAVENOUS
  Filled 2014-08-04: qty 3.75

## 2014-08-04 NOTE — Patient Instructions (Signed)

## 2014-08-05 ENCOUNTER — Encounter: Payer: Self-pay | Admitting: Gynecologic Oncology

## 2014-08-05 ENCOUNTER — Ambulatory Visit
Admission: RE | Admit: 2014-08-05 | Discharge: 2014-08-05 | Disposition: A | Discharge status: Home / Self Care | Payer: Medicare Other | Source: Ambulatory Visit | Attending: Radiation Oncology | Admitting: Radiation Oncology

## 2014-08-05 ENCOUNTER — Other Ambulatory Visit (HOSPITAL_BASED_OUTPATIENT_CLINIC_OR_DEPARTMENT_OTHER): Payer: Medicare Other

## 2014-08-05 ENCOUNTER — Other Ambulatory Visit: Payer: Self-pay | Admitting: Oncology

## 2014-08-05 ENCOUNTER — Telehealth: Payer: Self-pay | Admitting: *Deleted

## 2014-08-05 DIAGNOSIS — C541 Malignant neoplasm of endometrium: Secondary | ICD-10-CM | POA: Diagnosis not present

## 2014-08-05 DIAGNOSIS — Z510 Encounter for antineoplastic radiation therapy: Secondary | ICD-10-CM | POA: Diagnosis not present

## 2014-08-05 DIAGNOSIS — C7951 Secondary malignant neoplasm of bone: Secondary | ICD-10-CM | POA: Diagnosis not present

## 2014-08-05 DIAGNOSIS — Z923 Personal history of irradiation: Secondary | ICD-10-CM | POA: Diagnosis not present

## 2014-08-05 DIAGNOSIS — M545 Low back pain: Secondary | ICD-10-CM | POA: Diagnosis not present

## 2014-08-05 DIAGNOSIS — N393 Stress incontinence (female) (male): Secondary | ICD-10-CM | POA: Diagnosis not present

## 2014-08-05 LAB — BASIC METABOLIC PANEL (CC13)
Anion Gap: 7 mEq/L (ref 3–11)
BUN: 16.8 mg/dL (ref 7.0–26.0)
CO2: 25 mEq/L (ref 22–29)
Calcium: 9.1 mg/dL (ref 8.4–10.4)
Chloride: 109 mEq/L (ref 98–109)
Creatinine: 0.7 mg/dL (ref 0.6–1.1)
EGFR: 82 mL/min/{1.73_m2} — ABNORMAL LOW (ref >90)
Glucose: 81 mg/dl (ref 70–140)
Potassium: 4.8 mEq/L (ref 3.5–5.1)
Sodium: 142 mEq/L (ref 136–145)

## 2014-08-05 LAB — CBC WITH DIFFERENTIAL/PLATELET
BASO%: 0.2 % (ref 0.0–2.0)
Basophils Absolute: 0 10*3/uL (ref 0.0–0.1)
EOS%: 3.5 % (ref 0.0–7.0)
Eosinophils Absolute: 0.2 10*3/uL (ref 0.0–0.5)
HCT: 37 % (ref 34.8–46.6)
HGB: 12.2 g/dL (ref 11.6–15.9)
LYMPH%: 14 % (ref 14.0–49.7)
MCH: 31.1 pg (ref 25.1–34.0)
MCHC: 33 g/dL (ref 31.5–36.0)
MCV: 94.4 fL (ref 79.5–101.0)
MONO#: 0.7 10*3/uL (ref 0.1–0.9)
MONO%: 14.6 % — ABNORMAL HIGH (ref 0.0–14.0)
NEUT#: 3.3 10*3/uL (ref 1.5–6.5)
NEUT%: 67.7 % (ref 38.4–76.8)
Platelets: 166 10*3/uL (ref 145–400)
RBC: 3.92 10*6/uL (ref 3.70–5.45)
RDW: 13.9 % (ref 11.2–14.5)
WBC: 4.8 10*3/uL (ref 3.9–10.3)
lymph#: 0.7 10*3/uL — ABNORMAL LOW (ref 0.9–3.3)

## 2014-08-05 NOTE — Telephone Encounter (Signed)
-----   Message from Gordy Levan, MD sent at 08/05/2014 10:36 AM EDT ----- Labs seen and need follow up: please let her know labs including blood counts and renal function all fine today

## 2014-08-05 NOTE — Telephone Encounter (Signed)
Called patient with message regarding lab results per Dr. Marko Plume. Left a message on cell.

## 2014-08-08 ENCOUNTER — Ambulatory Visit
Admission: RE | Admit: 2014-08-08 | Discharge: 2014-08-08 | Disposition: A | Discharge status: Home / Self Care | Payer: Medicare Other | Source: Ambulatory Visit | Attending: Radiation Oncology | Admitting: Radiation Oncology

## 2014-08-08 ENCOUNTER — Telehealth: Payer: Self-pay

## 2014-08-08 DIAGNOSIS — C541 Malignant neoplasm of endometrium: Secondary | ICD-10-CM | POA: Diagnosis not present

## 2014-08-08 DIAGNOSIS — M545 Low back pain: Secondary | ICD-10-CM | POA: Diagnosis not present

## 2014-08-08 DIAGNOSIS — Z923 Personal history of irradiation: Secondary | ICD-10-CM | POA: Diagnosis not present

## 2014-08-08 DIAGNOSIS — C7951 Secondary malignant neoplasm of bone: Secondary | ICD-10-CM | POA: Diagnosis not present

## 2014-08-08 DIAGNOSIS — N393 Stress incontinence (female) (male): Secondary | ICD-10-CM | POA: Diagnosis not present

## 2014-08-08 DIAGNOSIS — Z510 Encounter for antineoplastic radiation therapy: Secondary | ICD-10-CM | POA: Diagnosis not present

## 2014-08-08 NOTE — Telephone Encounter (Signed)
[name] wanted to know if she needed to keep her PCP physical on 09-01-14 since she is seeing so many physicians in the same time frame? Told her that Dr. Marko Plume said that she could r/s PCP appointment in next 1-2 months if she would like.  [name] verbalized understanding.

## 2014-08-09 ENCOUNTER — Ambulatory Visit
Admission: RE | Admit: 2014-08-09 | Discharge: 2014-08-09 | Disposition: A | Discharge status: Home / Self Care | Payer: Medicare Other | Source: Ambulatory Visit | Attending: Radiation Oncology | Admitting: Radiation Oncology

## 2014-08-09 ENCOUNTER — Encounter: Payer: Self-pay | Admitting: Radiation Oncology

## 2014-08-09 VITALS — BP 153/70 | HR 64 | Temp 98.0°F | Resp 16 | Wt 104.6 lb

## 2014-08-09 DIAGNOSIS — Z510 Encounter for antineoplastic radiation therapy: Secondary | ICD-10-CM | POA: Diagnosis not present

## 2014-08-09 DIAGNOSIS — N393 Stress incontinence (female) (male): Secondary | ICD-10-CM | POA: Diagnosis not present

## 2014-08-09 DIAGNOSIS — C7951 Secondary malignant neoplasm of bone: Secondary | ICD-10-CM | POA: Diagnosis not present

## 2014-08-09 DIAGNOSIS — Z923 Personal history of irradiation: Secondary | ICD-10-CM | POA: Diagnosis not present

## 2014-08-09 DIAGNOSIS — M545 Low back pain: Secondary | ICD-10-CM | POA: Diagnosis not present

## 2014-08-09 DIAGNOSIS — C541 Malignant neoplasm of endometrium: Secondary | ICD-10-CM | POA: Diagnosis not present

## 2014-08-09 DIAGNOSIS — C549 Malignant neoplasm of corpus uteri, unspecified: Secondary | ICD-10-CM

## 2014-08-09 NOTE — Progress Notes (Signed)
  Radiation Oncology         [phone] ________________________________  Name: DARNEISHA WINDHORST MRN: [id number]  Date: 08/09/2014  DOB: 02-27-1941  Weekly Radiation Therapy Management  DIAGNOSIS: Recurrent papillary serous carcinoma of the uterus, isolated recurrence in the left lumbar paraspinal area   Current Dose: 48.4 Gy     Planned Dose:  55 Gy  Narrative . . . . . . . . The patient presents for routine under treatment assessment.                                   The patient is without complaint. She had one episode of diarrhea but nothing on consistent basis. She has mild fatigue.                                 Set-up films were reviewed.                                 The chart was checked. Physical Findings. . .  weight is 104 lb 9.6 oz (47.446 kg). Her oral temperature is 98 F (36.7 C). Her blood pressure is 153/70 and her pulse is 64. Her respiration is 16 and oxygen saturation is 100%. . Weight essentially stable. The lungs are clear. The heart has a regular rhythm and rate. The abdomen is soft and nontender with normal bowel sounds. Impression . . . . . . . The patient is tolerating radiation. Plan . . . . . . . . . . . . Continue treatment as planned.  ________________________________   Blair Promise, PhD, MD

## 2014-08-09 NOTE — Progress Notes (Signed)
Weight and vitals stable. Reports intermittent left leg and knee pain for which she takes naproxen. Reports a single episode of diarrhea last week. Denies any vaginal odor, itching or discharge. Reports mild fatigue. Continues to exercise most days. One month follow up appointment card given since patient completes in three days.

## 2014-08-10 ENCOUNTER — Ambulatory Visit
Admission: RE | Admit: 2014-08-10 | Discharge: 2014-08-10 | Disposition: A | Discharge status: Home / Self Care | Payer: Medicare Other | Source: Ambulatory Visit | Attending: Radiation Oncology | Admitting: Radiation Oncology

## 2014-08-10 DIAGNOSIS — M545 Low back pain: Secondary | ICD-10-CM | POA: Diagnosis not present

## 2014-08-10 DIAGNOSIS — N393 Stress incontinence (female) (male): Secondary | ICD-10-CM | POA: Diagnosis not present

## 2014-08-10 DIAGNOSIS — C541 Malignant neoplasm of endometrium: Secondary | ICD-10-CM | POA: Diagnosis not present

## 2014-08-10 DIAGNOSIS — C7951 Secondary malignant neoplasm of bone: Secondary | ICD-10-CM | POA: Diagnosis not present

## 2014-08-10 DIAGNOSIS — Z510 Encounter for antineoplastic radiation therapy: Secondary | ICD-10-CM | POA: Diagnosis not present

## 2014-08-10 DIAGNOSIS — Z923 Personal history of irradiation: Secondary | ICD-10-CM | POA: Diagnosis not present

## 2014-08-11 ENCOUNTER — Ambulatory Visit
Admission: RE | Admit: 2014-08-11 | Discharge: 2014-08-11 | Disposition: A | Discharge status: Home / Self Care | Payer: Medicare Other | Source: Ambulatory Visit | Attending: Radiation Oncology | Admitting: Radiation Oncology

## 2014-08-11 DIAGNOSIS — Z510 Encounter for antineoplastic radiation therapy: Secondary | ICD-10-CM | POA: Diagnosis not present

## 2014-08-11 DIAGNOSIS — N393 Stress incontinence (female) (male): Secondary | ICD-10-CM | POA: Diagnosis not present

## 2014-08-11 DIAGNOSIS — C541 Malignant neoplasm of endometrium: Secondary | ICD-10-CM | POA: Diagnosis not present

## 2014-08-11 DIAGNOSIS — C7951 Secondary malignant neoplasm of bone: Secondary | ICD-10-CM | POA: Diagnosis not present

## 2014-08-11 DIAGNOSIS — M545 Low back pain: Secondary | ICD-10-CM | POA: Diagnosis not present

## 2014-08-11 DIAGNOSIS — Z923 Personal history of irradiation: Secondary | ICD-10-CM | POA: Diagnosis not present

## 2014-08-12 ENCOUNTER — Other Ambulatory Visit (HOSPITAL_BASED_OUTPATIENT_CLINIC_OR_DEPARTMENT_OTHER): Payer: Medicare Other

## 2014-08-12 ENCOUNTER — Ambulatory Visit
Admission: RE | Admit: 2014-08-12 | Discharge: 2014-08-12 | Disposition: A | Discharge status: Home / Self Care | Payer: Medicare Other | Source: Ambulatory Visit | Attending: Radiation Oncology | Admitting: Radiation Oncology

## 2014-08-12 DIAGNOSIS — Z923 Personal history of irradiation: Secondary | ICD-10-CM | POA: Diagnosis not present

## 2014-08-12 DIAGNOSIS — C7951 Secondary malignant neoplasm of bone: Secondary | ICD-10-CM | POA: Diagnosis not present

## 2014-08-12 DIAGNOSIS — C541 Malignant neoplasm of endometrium: Secondary | ICD-10-CM

## 2014-08-12 DIAGNOSIS — Z510 Encounter for antineoplastic radiation therapy: Secondary | ICD-10-CM | POA: Diagnosis not present

## 2014-08-12 DIAGNOSIS — N393 Stress incontinence (female) (male): Secondary | ICD-10-CM | POA: Diagnosis not present

## 2014-08-12 DIAGNOSIS — M545 Low back pain: Secondary | ICD-10-CM | POA: Diagnosis not present

## 2014-08-12 LAB — BASIC METABOLIC PANEL (CC13)
Anion Gap: 10 mEq/L (ref 3–11)
BUN: 19.4 mg/dL (ref 7.0–26.0)
CO2: 25 mEq/L (ref 22–29)
Calcium: 9.3 mg/dL (ref 8.4–10.4)
Chloride: 106 mEq/L (ref 98–109)
Creatinine: 0.6 mg/dL (ref 0.6–1.1)
EGFR: 88 mL/min/{1.73_m2} — ABNORMAL LOW (ref >90)
Glucose: 83 mg/dl (ref 70–140)
Potassium: 3.9 mEq/L (ref 3.5–5.1)
Sodium: 141 mEq/L (ref 136–145)

## 2014-08-15 ENCOUNTER — Encounter (HOSPITAL_COMMUNITY): Payer: Self-pay

## 2014-08-15 ENCOUNTER — Ambulatory Visit (HOSPITAL_COMMUNITY)
Admission: RE | Admit: 2014-08-15 | Discharge: 2014-08-15 | Disposition: A | Discharge status: Home / Self Care | Payer: Medicare Other | Source: Ambulatory Visit | Attending: Gynecologic Oncology | Admitting: Gynecologic Oncology

## 2014-08-15 DIAGNOSIS — Z853 Personal history of malignant neoplasm of breast: Secondary | ICD-10-CM | POA: Insufficient documentation

## 2014-08-15 DIAGNOSIS — Z9221 Personal history of antineoplastic chemotherapy: Secondary | ICD-10-CM | POA: Diagnosis not present

## 2014-08-15 DIAGNOSIS — Z923 Personal history of irradiation: Secondary | ICD-10-CM | POA: Insufficient documentation

## 2014-08-15 DIAGNOSIS — C541 Malignant neoplasm of endometrium: Secondary | ICD-10-CM | POA: Diagnosis not present

## 2014-08-15 DIAGNOSIS — K8020 Calculus of gallbladder without cholecystitis without obstruction: Secondary | ICD-10-CM | POA: Diagnosis not present

## 2014-08-15 DIAGNOSIS — I7090 Unspecified atherosclerosis: Secondary | ICD-10-CM | POA: Diagnosis not present

## 2014-08-15 MED ORDER — IOHEXOL 300 MG/ML  SOLN
100.0000 mL | Freq: Once | INTRAMUSCULAR | Status: AC | PRN
  Administered 2014-08-15: 80 mL via INTRAVENOUS

## 2014-08-16 ENCOUNTER — Telehealth: Payer: Self-pay | Admitting: Gynecologic Oncology

## 2014-08-16 NOTE — Telephone Encounter (Signed)
Patient called requesting CT scan results.  Returned call to patient.  Informed of results.  No concerns voiced.  Advised to follow up as scheduled and call for any questions or concerns.

## 2014-08-18 ENCOUNTER — Ambulatory Visit: Payer: Medicare Other | Admitting: Gynecologic Oncology

## 2014-08-24 ENCOUNTER — Encounter: Payer: Self-pay | Admitting: Radiation Oncology

## 2014-08-24 NOTE — Progress Notes (Signed)
  Radiation Oncology         [phone] ________________________________  Name: [name] MRN: [id number]  Date: 08/24/2014  DOB: 07/05/1940  End of Treatment Note   DIAGNOSIS: Recurrent papillary serous carcinoma of the uterus, isolated recurrence in the left lumbar paraspinal area   Indication for treatment: Pain in the lower back region  Radiation treatment dates:   February 29 through April 1  Site/dose:  Site of recurrence in the left lumbar paraspinal area, 55 gray in 25 fractions  Beams/energy:   Helical intensity modulated radiation therapy, 6 MV  Narrative: The patient tolerated radiation treatment relatively well.   She had mild diarrhea with her treatments. She denied any nausea. Patient's pain in the lower back improved with her therapy.  Plan: The patient has completed radiation treatment. The patient will return to radiation oncology clinic for routine followup in one month. I advised them to call or return sooner if they have any questions or concerns related to their recovery or treatment.  -----------------------------------  Blair Promise, PhD, MD

## 2014-08-25 ENCOUNTER — Encounter: Payer: Self-pay | Admitting: Gynecologic Oncology

## 2014-08-26 DIAGNOSIS — M859 Disorder of bone density and structure, unspecified: Secondary | ICD-10-CM | POA: Diagnosis not present

## 2014-08-26 DIAGNOSIS — E785 Hyperlipidemia, unspecified: Secondary | ICD-10-CM | POA: Diagnosis not present

## 2014-08-26 DIAGNOSIS — Z0000 Encounter for general adult medical examination without abnormal findings: Secondary | ICD-10-CM | POA: Diagnosis not present

## 2014-08-26 DIAGNOSIS — I10 Essential (primary) hypertension: Secondary | ICD-10-CM | POA: Diagnosis not present

## 2014-08-28 ENCOUNTER — Other Ambulatory Visit: Payer: Self-pay | Admitting: Oncology

## 2014-08-31 ENCOUNTER — Encounter: Payer: Self-pay | Admitting: Skilled Nursing Facility1

## 2014-08-31 NOTE — Progress Notes (Signed)
Subjective:     Patient ID: [name], female   DOB: 06-Aug-1940, 74 y.o.   MRN: [id number]  HPI   Review of Systems     Objective:   Physical Exam To identify dietary strategies to gain some lost wt back.    Assessment:     Pt identified as being malnourished due to wt loss. Pt contacted via telephone [phone]) but was unavailable.    Plan:     Dietitian left a voicemail for the pt to contact Ernestene Kiel RD,CSO,LDN at [phone].

## 2014-09-01 ENCOUNTER — Encounter: Payer: Self-pay | Admitting: Oncology

## 2014-09-01 ENCOUNTER — Telehealth: Payer: Self-pay | Admitting: Oncology

## 2014-09-01 ENCOUNTER — Ambulatory Visit (HOSPITAL_BASED_OUTPATIENT_CLINIC_OR_DEPARTMENT_OTHER): Payer: Medicare Other | Admitting: Oncology

## 2014-09-01 ENCOUNTER — Telehealth: Payer: Self-pay | Admitting: Nutrition

## 2014-09-01 ENCOUNTER — Other Ambulatory Visit (HOSPITAL_BASED_OUTPATIENT_CLINIC_OR_DEPARTMENT_OTHER): Payer: Medicare Other

## 2014-09-01 VITALS — BP 152/70 | HR 57 | Temp 97.5°F | Resp 18 | Ht 59.0 in | Wt 107.3 lb

## 2014-09-01 DIAGNOSIS — M545 Low back pain: Secondary | ICD-10-CM | POA: Diagnosis not present

## 2014-09-01 DIAGNOSIS — Z1389 Encounter for screening for other disorder: Secondary | ICD-10-CM | POA: Diagnosis not present

## 2014-09-01 DIAGNOSIS — E785 Hyperlipidemia, unspecified: Secondary | ICD-10-CM | POA: Diagnosis not present

## 2014-09-01 DIAGNOSIS — I77810 Thoracic aortic ectasia: Secondary | ICD-10-CM | POA: Diagnosis not present

## 2014-09-01 DIAGNOSIS — C7951 Secondary malignant neoplasm of bone: Secondary | ICD-10-CM

## 2014-09-01 DIAGNOSIS — G893 Neoplasm related pain (acute) (chronic): Secondary | ICD-10-CM

## 2014-09-01 DIAGNOSIS — Z6821 Body mass index (BMI) 21.0-21.9, adult: Secondary | ICD-10-CM | POA: Diagnosis not present

## 2014-09-01 DIAGNOSIS — C541 Malignant neoplasm of endometrium: Secondary | ICD-10-CM | POA: Diagnosis not present

## 2014-09-01 DIAGNOSIS — Z0000 Encounter for general adult medical examination without abnormal findings: Secondary | ICD-10-CM | POA: Diagnosis not present

## 2014-09-01 DIAGNOSIS — I700 Atherosclerosis of aorta: Secondary | ICD-10-CM | POA: Diagnosis not present

## 2014-09-01 DIAGNOSIS — I2510 Atherosclerotic heart disease of native coronary artery without angina pectoris: Secondary | ICD-10-CM | POA: Diagnosis not present

## 2014-09-01 DIAGNOSIS — N3090 Cystitis, unspecified without hematuria: Secondary | ICD-10-CM | POA: Diagnosis not present

## 2014-09-01 DIAGNOSIS — M8580 Other specified disorders of bone density and structure, unspecified site: Secondary | ICD-10-CM | POA: Diagnosis not present

## 2014-09-01 DIAGNOSIS — Z853 Personal history of malignant neoplasm of breast: Secondary | ICD-10-CM | POA: Diagnosis not present

## 2014-09-01 DIAGNOSIS — I10 Essential (primary) hypertension: Secondary | ICD-10-CM | POA: Diagnosis not present

## 2014-09-01 LAB — COMPREHENSIVE METABOLIC PANEL (CC13)
ALT: 21 U/L (ref 0–55)
AST: 26 U/L (ref 5–34)
Albumin: 3.5 g/dL (ref 3.5–5.0)
Alkaline Phosphatase: 45 U/L (ref 40–150)
Anion Gap: 12 mEq/L — ABNORMAL HIGH (ref 3–11)
BUN: 15.7 mg/dL (ref 7.0–26.0)
CO2: 24 mEq/L (ref 22–29)
Calcium: 9.3 mg/dL (ref 8.4–10.4)
Chloride: 108 mEq/L (ref 98–109)
Creatinine: 0.7 mg/dL (ref 0.6–1.1)
EGFR: 84 mL/min/{1.73_m2} — ABNORMAL LOW (ref >90)
Glucose: 70 mg/dl (ref 70–140)
Potassium: 4.2 mEq/L (ref 3.5–5.1)
Sodium: 144 mEq/L (ref 136–145)
Total Bilirubin: 0.47 mg/dL (ref 0.20–1.20)
Total Protein: 6.6 g/dL (ref 6.4–8.3)

## 2014-09-01 LAB — CBC WITH DIFFERENTIAL/PLATELET
BASO%: 0.4 % (ref 0.0–2.0)
Basophils Absolute: 0 10*3/uL (ref 0.0–0.1)
EOS%: 5.1 % (ref 0.0–7.0)
Eosinophils Absolute: 0.3 10*3/uL (ref 0.0–0.5)
HCT: 37.4 % (ref 34.8–46.6)
HGB: 12.1 g/dL (ref 11.6–15.9)
LYMPH%: 19.5 % (ref 14.0–49.7)
MCH: 30.6 pg (ref 25.1–34.0)
MCHC: 32.4 g/dL (ref 31.5–36.0)
MCV: 94.7 fL (ref 79.5–101.0)
MONO#: 0.6 10*3/uL (ref 0.1–0.9)
MONO%: 12.6 % (ref 0.0–14.0)
NEUT#: 3.1 10*3/uL (ref 1.5–6.5)
NEUT%: 62.4 % (ref 38.4–76.8)
Platelets: 177 10*3/uL (ref 145–400)
RBC: 3.95 10*6/uL (ref 3.70–5.45)
RDW: 13.9 % (ref 11.2–14.5)
WBC: 4.9 10*3/uL (ref 3.9–10.3)
lymph#: 1 10*3/uL (ref 0.9–3.3)

## 2014-09-01 NOTE — Progress Notes (Signed)
OFFICE PROGRESS NOTE   September 01, 2014   Physicians:P.Gehrig, J.Kinard, (T.Brackbill) J.Russo, S.Ganem  INTERVAL HISTORY:  Patient is seen, alone for visit, as she continues monthly zometa for recurrent endometrial carcinoma involving L3-4; she is due #3 zometa today. She has had good clinical improvement with radiation given 2-29 thru 08-12-14 by Dr Sondra Come. She will see Dr Sondra Come and Dr Alycia Rossetti next week. Last imaging was CT CAP 08-15-14, with L3-4 mass smaller and no new areas of concern.  Patient notices very little discomfort in low back now. She continues gabapentin 300 mg 0800 and 1400 and naproxen q AM. She is comfortable and sleeps well at night. She is not as alert with the gabapentin, would like to stop this if possible. She is back at gym, walking on treadmill x 15-20 min at a time, which is much less that prior to this illness but which she is tolerating well. She has only occasional loose stools now, after having diarrhea during RT.Appetite is improved. No new or different pain. She felt well enough to enjoy recent trip to beach with 2 high school friends.  She had no difficulty with zometa on 08-04-14.  No PAC   ONCOLOGIC HISTORY   Malignant neoplasm of corpus uteri, except isthmus   12/22/2007 Surgery Staging for IA UPSC    - 05/28/2008 Chemotherapy 6 cycles of paclitaxel and carboplatin with vaginal cuff brachytherapy   03/22/2011 Relapse/Recurrence Left pelvic node recurrence   04/25/2011 Surgery L/S evaluation, not resectable.   05/16/2011 - 09/25/2011 Chemotherapy 6 cyclces paclitaxel and carboplatin    - 12/27/2011 Radiation Therapy radiation completed    Endometrial cancer   12/01/2007 Initial Diagnosis Endometrial cancer   12/15/2007 Surgery    01/22/2008 -  Chemotherapy 6 cycles paclitaxol and carboplatin    Radiation Therapy HDR   03/31/2011 Relapse/Recurrence chemotherapy and external beam radiation   03/10/2013 Progression    04/02/2013 Surgery pelvic mass resection with  IORT  BREAST CANCER: microinvasive T1N0 right breast cancer which was ER/PR positive and HER-2 2+ with insufficient material for FISH at diagnosis in 03/1999, treated with lumpectomy with 2 sentinel node evaluation, local radiation and 5 years of Tamoxifen thru 05/2004, then aromatase inhibitor 07/2004 thru 04/2006.   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 She had 6 cycles of taxol/carboplatin through Jan 2010, and vaginal cuff brachytherapy. She did well until some vague LUQ symptoms in Nov. 2012, with CT AP in [facility] Mar 22, 2011 showing left pelvic sidewall involvement. She had laparoscopic evaluation at [facility] by [place] 04-25-2011, with finding of dense fibrosis in the area as well as mass encasing obturator nerve and vein as well as internal iliac vasculature; left ureterolysis was performed. No pathology was submitted from that procedure. She received 3 additional cycles of taxol/ carboplatin from 05-16-2011 thru 06-28-2011. CT AP on 07-17-2011 showed improvement in the left pelvic sidewall mass now 1.9 x 2 cm and 3.4 cm cephalocaudad, as compared with 2.5 x 2.2 cm and 5.4 cm on CT 03-22-11, and no progressive disease elsewhere. She saw Dr.Gehrig after the CT, with recommendation for an additonal 3 cycles of same chemotherapy then repeat scan. Cycle 4 was taxol + carbo and neulasta, cycle 5 taxol only due to counts and cycle 6 taxol + carboplatin on 09-17-11. Follow up CT in May 2013 showed enlargement of the left pelvic sidewall involvement. She had IMRT 50  GY in 28 fractions by Dr Sondra Come, that completed 12-05-11. She had regular follow up scans, Including CT AP 01-2014 and PET 02-2013, which did not show progressive disease. She has scoliosis and some chronic intermittent low back discomfort, but developed different, persistent discomfort low back and radiating to  LLE by ~ Dec into Jan 2016. MRI lumbar spine at [facility] on 06-13-2014 showed known scoliosis and degenerative disc disease, with new finding of 2.7 x 2.4 x 1.9 cm soft tissue mass at L3-4 involving left neural foramen, with focal cortical destruction of adjacent L3 vertebral body and portion of left L4 pedicle. She had CT biopsy on 06-21-14, with pathology ([id number]) poorly differentiated carcinoma consistent with high grade serous. Bone scan 06-22-14 had uptake posteriorly in upper lumbar spine and a subtle increased area of uptake right greater trochanter of unclear significance. IMRT by Dr Sondra Come 2-29 thru 08-12-14. First zometa 07-07-14.  Review of systems as above, also: No SOB or cough. No nausea. No LE swelling. Remainder of 10 point Review of Systems negative.  Objective:  Vital signs in last 24 hours:  BP 152/70 mmHg  Pulse 57  Temp(Src) 97.5 F (36.4 C) (Oral)  Resp 18  Ht $R'4\' 11"'so$  (1.499 m)  Wt 107 lb 4.8 oz (48.671 kg)  BMI 21.66 kg/m2  SpO2 99% Weight up 3 lbs Alert, oriented and appropriate. Ambulatory without difficulty, appears comfortable seated in exam room  No alopecia  HEENT:PERRL, sclerae not icteric. Oral mucosa moist without lesions, posterior pharynx clear.  Neck supple. No JVD.  Lymphatics:no cervical,supraclavicular, axillary or inguinal adenopathy Resp: clear to auscultation bilaterally and normal percussion bilaterally Cardio: regular rate and rhythm. No gallop. GI: soft, nontender, not distended, no mass or organomegaly. Normally active bowel sounds. Surgical incisions not remarkable. Musculoskeletal/ Extremities: without pitting edema, cords, tenderness Neuro: nonfocal   PSYCH appropriate mood and affect Skin without rash, ecchymosis, petechiae   Lab Results:  Results for orders placed or performed in visit on 09/01/14  CBC with Differential  Result Value Ref Range   WBC 4.9 3.9 - 10.3 10e3/uL   NEUT# 3.1 1.5 - 6.5 10e3/uL   HGB 12.1 11.6  - 15.9 g/dL   HCT 37.4 34.8 - 46.6 %   Platelets 177 145 - 400 10e3/uL   MCV 94.7 79.5 - 101.0 fL   MCH 30.6 25.1 - 34.0 pg   MCHC 32.4 31.5 - 36.0 g/dL   RBC 3.95 3.70 - 5.45 10e6/uL   RDW 13.9 11.2 - 14.5 %   lymph# 1.0 0.9 - 3.3 10e3/uL   MONO# 0.6 0.1 - 0.9 10e3/uL   Eosinophils Absolute 0.3 0.0 - 0.5 10e3/uL   Basophils Absolute 0.0 0.0 - 0.1 10e3/uL   NEUT% 62.4 38.4 - 76.8 %   LYMPH% 19.5 14.0 - 49.7 %   MONO% 12.6 0.0 - 14.0 %   EOS% 5.1 0.0 - 7.0 %   BASO% 0.4 0.0 - 2.0 %  Comprehensive metabolic panel (Cmet) - [facility]  Result Value Ref Range   Sodium 144 136 - 145 mEq/L   Potassium 4.2 3.5 - 5.1 mEq/L   Chloride 108 98 - 109 mEq/L   CO2 24 22 - 29 mEq/L   Glucose 70 70 - 140 mg/dl   BUN 15.7 7.0 - 26.0 mg/dL   Creatinine 0.7 0.6 - 1.1 mg/dL   Total Bilirubin 0.47 0.20 - 1.20 mg/dL   Alkaline Phosphatase 45 40 - 150 U/L   AST 26 5 - 34 U/L  ALT 21 0 - 55 U/L   Total Protein 6.6 6.4 - 8.3 g/dL   Albumin 3.5 3.5 - 5.0 g/dL   Calcium 9.3 8.4 - 10.4 mg/dL   Anion Gap 12 (H) 3 - 11 mEq/L   EGFR 84 (L) >90 ml/min/1.73 m2     Studies/Results:  EXAM: CT CHEST, ABDOMEN, AND PELVIS WITH CONTRAST  TECHNIQUE: Multidetector CT imaging of the chest, abdomen and pelvis was performed following the standard protocol during bolus administration of intravenous contrast.  CONTRAST: 32mL OMNIPAQUE IOHEXOL 300 MG/ML SOLN  COMPARISON: 02/08/2014 abdominal pelvic CT. PET of 03/10/2013. diagnostic chest CT of 12/01/2007. Paraspinous mass biopsy on 06/21/2014 which per clinical service note of 07/29/2014 was consistent with metastatic disease.  FINDINGS: CT CHEST FINDINGS  Mediastinum/Nodes: No supraclavicular adenopathy. Right axillary node dissection. No axillary adenopathy. Tortuous descending thoracic the aorta. Aortic and branch vessel atherosclerosis. Mild cardiomegaly. Multivessel coronary artery atherosclerosis. No central pulmonary embolism, on this  non-dedicated study. No mediastinal or hilar adenopathy.  Lungs/Pleura: No pleural fluid. Minimal radiation fibrosis in the subpleural right upper lobe suspected.  Vague ground-glass nodule in the superior segment right lower lobe measures 8 mm on image 27 of series 4. This was present on 03/10/2013, measuring similarly (when remeasured). Clear left lung.  Musculoskeletal: No acute osseous abnormality.  CT ABDOMEN AND PELVIS FINDINGS  Hepatobiliary: Normal liver. Multiple gallstones without acute cholecystitis or biliary ductal dilatation.  Pancreas: Normal pancreas for age.  Spleen: Normal  Adrenals/Urinary Tract: Normal adrenal glands. Normal kidneys, without hydronephrosis. Normal urinary bladder.  Stomach/Bowel: Normal stomach, without wall thickening. Sigmoid appears mildly thick walled on image 98 of series 2. Underdistended. Diverticula in this region. No surrounding inflammation. Normal terminal ileum. Normal small bowel.  Vascular/Lymphatic: Advanced aortic and branch vessel atherosclerosis. No abdominopelvic adenopathy.  Reproductive: Hysterectomy. No adnexal mass.  Other: No significant free fluid. No evidence of omental or peritoneal disease. Pelvic floor laxity.  Musculoskeletal: Tarlov cysts. The left paraspinous lesion at L3-4 demonstrates peripheral enhancement and measures 2.0 x 1.8 cm on image 70 of series 2. On the biopsy study of 06/21/2014, measured 2.2 x 2.0 cm. Back on 02/08/2014, measured 1.4 x 1.1 cm.  No new soft tissue lesions are identified.  Mild osteopenia. Convex left lumbar spine curvature. L4-5 anterolisthesis which is favored to be degenerative.  IMPRESSION: 1. Since the biopsy study of 06/21/2014, similar to slight decrease in size of a left paraspinous lesion at L3-4. 2. No new sites of metastatic disease identified. 3. 8 mm ground-glass nodule in the right lower lobe is grossly similar to 03/10/2013, suggesting  a benign etiology. 4. Cholelithiasis. 5. Apparent sigmoid colonic wall thickening which could be due to underdistention. Colitis felt less likely. 6. Atherosclerosis, including within the coronary arteries. 7. Pelvic floor laxity.  Medications: I have reviewed the patient's current medications. She will try decreasing gabapentin to 300 mg once daily between now and when she sees Dr Alycia Rossetti next week,as long as she is otherwise comfortable. If she does well, she could then try without the gabapentin; I did tell her that gabapentin is available in 100 mg strength, however she prefers to try this way using what she has. We disucssed timing of the gabapentin doses during day from standpoint of slight sedation. She has been on naproxen long-term for benign orthopedic back problems, knows to take with food.  DISCUSSION: meds as above. Will continue zometa ~ q 4 weeks x total 6, then may change to once every 3-4 months  if no concerns. Dr Alycia Rossetti and I have previously discussed observation otherwise for now, tho certainly she is at significant risk for further progression at some point.  Assessment/Plan: 1.Papillary serous endometrial carcinoma IB at diagnosis Aug 2009, recurrent at left pelvic sidewall 03-2011 and progression now in area of left lumbar spine: Radiation already improving symptoms. Continue ~monthly zometa today and at least for next few months, next dose early June to avoid her family beach trip. I will coordinate my visits with the zometa. Try to taper down and off gabapentin if no longer needed, otherwise may be able to decrease dose. 2.T1N0 right breast cancer 03-1999: adjuvant therapy included 5 years of tamoxifen. Not recurrent. Up to date on mammograms, tomo medically appropriate due to density of breast tissue 3.scoliosis of lumbar spine with degenerative disc disease 4.HTN followed by Dr Virgina Jock, previously by Dr Mare Ferrari   All questions answered. Zometa orders confirmed. She knows  to call if any concerns prior to next scheduled visit. Time spent 25 min including >50% counseling and coordination of care.    LIVESAY,LENNIS P, MD   09/01/2014, 9:43 AM

## 2014-09-01 NOTE — Telephone Encounter (Signed)
Patient was contacted by RD secondary to weight loss and poor appetite.  She was unavailable so RD left message for her return call.  Unfortunately, when patient returned call, I was unavailable. I returned patient's phone call but again needed to leave a message. Encouraged patient to contact me if I could mail information or schedule appointment to assist with weight loss and poor appetite. Contact information was provided.  **Disclaimer: This note was dictated with voice recognition software. Similar sounding words can inadvertently be transcribed and this note may contain transcription errors which may not have been corrected upon publication of note.**

## 2014-09-01 NOTE — Patient Instructions (Signed)
Fine to decrease gabapentin to 300 mg once daily, either in AM or later in afternoon, if back pain is no worse on this lower dose. Continue naprosyn once daily in AM with food. When you see Dr Alycia Rossetti on 4-28, you can talk with her about trying off gabapentin from there if no pain.

## 2014-09-01 NOTE — Telephone Encounter (Signed)
per pof to sch tp appt-gave pt copy of sch-sent MW email to sch pt trmt-pt has MY CHART will review

## 2014-09-02 ENCOUNTER — Ambulatory Visit (HOSPITAL_BASED_OUTPATIENT_CLINIC_OR_DEPARTMENT_OTHER): Payer: Medicare Other

## 2014-09-02 VITALS — BP 154/63 | HR 65 | Temp 98.6°F | Resp 18

## 2014-09-02 DIAGNOSIS — C541 Malignant neoplasm of endometrium: Secondary | ICD-10-CM

## 2014-09-02 DIAGNOSIS — C7951 Secondary malignant neoplasm of bone: Secondary | ICD-10-CM

## 2014-09-02 MED ORDER — ZOLEDRONIC ACID 4 MG/5ML IV CONC
3.0000 mg | Freq: Once | INTRAVENOUS | Status: AC
  Administered 2014-09-02: 3 mg via INTRAVENOUS
  Filled 2014-09-02: qty 3.75

## 2014-09-02 MED ORDER — SODIUM CHLORIDE 0.9 % IV SOLN
INTRAVENOUS | Status: DC
  Administered 2014-09-02: 10:00:00 via INTRAVENOUS

## 2014-09-02 NOTE — Patient Instructions (Signed)

## 2014-09-03 MED ORDER — ZOLEDRONIC ACID 4 MG/5ML IV CONC: 3.0000 mg | Freq: Once | INTRAVENOUS | Status: DC

## 2014-09-06 ENCOUNTER — Encounter: Payer: Self-pay | Admitting: Oncology

## 2014-09-07 ENCOUNTER — Ambulatory Visit
Admission: RE | Admit: 2014-09-07 | Discharge: 2014-09-07 | Disposition: A | Discharge status: Home / Self Care | Payer: Medicare Other | Source: Ambulatory Visit | Attending: Radiation Oncology | Admitting: Radiation Oncology

## 2014-09-07 ENCOUNTER — Encounter: Payer: Self-pay | Admitting: Radiation Oncology

## 2014-09-07 VITALS — BP 158/68 | HR 55 | Temp 98.1°F | Resp 12 | Ht 59.0 in | Wt 107.0 lb

## 2014-09-07 DIAGNOSIS — C541 Malignant neoplasm of endometrium: Secondary | ICD-10-CM | POA: Diagnosis not present

## 2014-09-07 NOTE — Progress Notes (Signed)
Radiation Oncology         [phone] ________________________________  Name: [name] MRN: [id number]  Date: 09/07/2014  DOB: [birth year]  Follow-Up Visit Note  CC: Precious Reel, MD  Nancy Marus, MD    ICD-9-CM ICD-10-CM   1. Endometrial cancer 182.0 C54.1     Diagnosis:   Recurrent papillary serous carcinoma of the uterus, isolated recurrence in the left lumbar paraspinal area  Interval Since Last Radiation:  26  days  Narrative:  The patient returns today for routine follow-up. Overall her low back pain has improved with her radiation therapy. She does take Ibuprofen 800 mg occasionally with lower back pain. She reports her appetite was decreased during radiation and is getting better now. She denies having any bowel issues and nausea. . The patient reports pain and tingling in the left knee after she stopped taking her Neurontin.  Denies flank pain and hematuria.  The patient did undergo a repeat CT scan soon after completion of her radiation therapy which did show modest shrinkage of the area of recurrence but the scan was performed quite soon after her radiation was complete. No new problems were noted          ALLERGIES:  is allergic to ace inhibitors; codeine; demerol; and dilaudid.  Meds: Current Outpatient Prescriptions  Medication Sig Dispense Refill  . amLODipine (NORVASC) 5 MG tablet Take 5 mg by mouth daily.    Marland Kitchen aspirin EC 81 MG tablet Take 81 mg by mouth daily.    Marland Kitchen atenolol (TENORMIN) 25 MG tablet Take 25 mg by mouth 2 (two) times daily.     . Biotin 5000 MCG TABS Take 5,000 mcg by mouth daily.    . cholecalciferol (VITAMIN D) 1000 UNITS tablet Take 1,000 Units by mouth every Monday, Wednesday, and Friday.    . Cyanocobalamin (VITAMIN B-12) 5000 MCG SUBL Place under the tongue.    . fish oil-omega-3 fatty acids 1000 MG capsule Take 1 g by mouth daily.      Marland Kitchen ibuprofen (ADVIL,MOTRIN) 800 MG tablet     . Magnesium 250 MG TABS Take 250 mg by mouth every  Monday, Wednesday, and Friday.     . Multiple Vitamin (MULTIVITAMIN WITH MINERALS) TABS tablet Take 1 tablet by mouth daily.    . Omega-3 Fatty Acids (FISH OIL PO) Take 360 mg by mouth daily.    . potassium gluconate 595 MG TABS tablet Take 595 mg by mouth every Monday, Wednesday, and Friday.    . simvastatin (ZOCOR) 20 MG tablet Take 20 mg by mouth daily.    Marland Kitchen ALPRAZolam (XANAX) 0.25 MG tablet Take 0.25 mg by mouth 2 (two) times daily as needed for anxiety.     . gabapentin (NEURONTIN) 300 MG capsule Take 300 mg by mouth 2 (two) times daily.   0  . naproxen (NAPROSYN) 500 MG tablet Take 500 mg by mouth daily.     . OxyCODONE HCl, Abuse Deter, 5 MG TABA Take 10 mg by mouth every 4 (four) hours as needed.     No current facility-administered medications for this encounter.    Physical Findings: The patient is in no acute distress. Patient is alert and oriented.  height is '4\' 11"'$  (1.499 m) and weight is 107 lb (48.535 kg). Her oral temperature is 98.1 F (36.7 C). Her blood pressure is 158/68 and her pulse is 55. Her respiration is 12. .  No palpable subclavicular or axillary adenopathy. Lungs are clear to auscultation.  The heart has regular rhythm and rate. The abdomen is soft and nontender with normal bowel sounds.   Lab Findings: Lab Results  Component Value Date   WBC 4.9 09/01/2014   HGB 12.1 09/01/2014   HCT 37.4 09/01/2014   MCV 94.7 09/01/2014   PLT 177 09/01/2014    Radiographic Findings: Ct Chest W Contrast  08/15/2014   CLINICAL DATA:  History of endometrial cancer diagnosed 11/2007 with recurrence 03/2011. Just finished chemotherapy last week. History of breast cancer. Prior radiation therapy.  EXAM: CT CHEST, ABDOMEN, AND PELVIS WITH CONTRAST  TECHNIQUE: Multidetector CT imaging of the chest, abdomen and pelvis was performed following the standard protocol during bolus administration of intravenous contrast.  CONTRAST:  68m OMNIPAQUE IOHEXOL 300 MG/ML  SOLN  COMPARISON:   02/08/2014 abdominal pelvic CT. PET of 03/10/2013. diagnostic chest CT of 12/01/2007. Paraspinous mass biopsy on 06/21/2014 which per clinical service note of 07/29/2014 was consistent with metastatic disease.  FINDINGS: CT CHEST FINDINGS  Mediastinum/Nodes: No supraclavicular adenopathy. Right axillary node dissection. No axillary adenopathy. Tortuous descending thoracic the aorta. Aortic and branch vessel atherosclerosis. Mild cardiomegaly. Multivessel coronary artery atherosclerosis. No central pulmonary embolism, on this non-dedicated study. No mediastinal or hilar adenopathy.  Lungs/Pleura: No pleural fluid. Minimal radiation fibrosis in the subpleural right upper lobe suspected.  Vague ground-glass nodule in the superior segment right lower lobe measures 8 mm on image 27 of series 4. This was present on 03/10/2013, measuring similarly (when remeasured). Clear left lung.  Musculoskeletal: No acute osseous abnormality.  CT ABDOMEN AND PELVIS FINDINGS  Hepatobiliary: Normal liver. Multiple gallstones without acute cholecystitis or biliary ductal dilatation.  Pancreas: Normal pancreas for age.  Spleen: Normal  Adrenals/Urinary Tract: Normal adrenal glands. Normal kidneys, without hydronephrosis. Normal urinary bladder.  Stomach/Bowel: Normal stomach, without wall thickening. Sigmoid appears mildly thick walled on image 98 of series 2. Underdistended. Diverticula in this region. No surrounding inflammation. Normal terminal ileum. Normal small bowel.  Vascular/Lymphatic: Advanced aortic and branch vessel atherosclerosis. No abdominopelvic adenopathy.  Reproductive: Hysterectomy.  No adnexal mass.  Other: No significant free fluid. No evidence of omental or peritoneal disease. Pelvic floor laxity.  Musculoskeletal: Tarlov cysts. The left paraspinous lesion at L3-4 demonstrates peripheral enhancement and measures 2.0 x 1.8 cm on image 70 of series 2. On the biopsy study of 06/21/2014, measured 2.2 x 2.0 cm. Back on  02/08/2014, measured 1.4 x 1.1 cm.  No new soft tissue lesions are identified.  Mild osteopenia. Convex left lumbar spine curvature. L4-5 anterolisthesis which is favored to be degenerative.  IMPRESSION: 1. Since the biopsy study of 06/21/2014, similar to slight decrease in size of a left paraspinous lesion at L3-4. 2. No new sites of metastatic disease identified. 3. 8 mm ground-glass nodule in the right lower lobe is grossly similar to 03/10/2013, suggesting a benign etiology. 4. Cholelithiasis. 5. Apparent sigmoid colonic wall thickening which could be due to underdistention. Colitis felt less likely. 6.  Atherosclerosis, including within the coronary arteries. 7. Pelvic floor laxity.   Electronically Signed   By: KAbigail MiyamotoM.D.   On: 08/15/2014 10:29   Ct Abdomen Pelvis W Contrast  08/15/2014   CLINICAL DATA:  History of endometrial cancer diagnosed 11/2007 with recurrence 03/2011. Just finished chemotherapy last week. History of breast cancer. Prior radiation therapy.  EXAM: CT CHEST, ABDOMEN, AND PELVIS WITH CONTRAST  TECHNIQUE: Multidetector CT imaging of the chest, abdomen and pelvis was performed following the standard protocol during bolus administration of intravenous  contrast.  CONTRAST:  78m OMNIPAQUE IOHEXOL 300 MG/ML  SOLN  COMPARISON:  02/08/2014 abdominal pelvic CT. PET of 03/10/2013. diagnostic chest CT of 12/01/2007. Paraspinous mass biopsy on 06/21/2014 which per clinical service note of 07/29/2014 was consistent with metastatic disease.  FINDINGS: CT CHEST FINDINGS  Mediastinum/Nodes: No supraclavicular adenopathy. Right axillary node dissection. No axillary adenopathy. Tortuous descending thoracic the aorta. Aortic and branch vessel atherosclerosis. Mild cardiomegaly. Multivessel coronary artery atherosclerosis. No central pulmonary embolism, on this non-dedicated study. No mediastinal or hilar adenopathy.  Lungs/Pleura: No pleural fluid. Minimal radiation fibrosis in the subpleural right  upper lobe suspected.  Vague ground-glass nodule in the superior segment right lower lobe measures 8 mm on image 27 of series 4. This was present on 03/10/2013, measuring similarly (when remeasured). Clear left lung.  Musculoskeletal: No acute osseous abnormality.  CT ABDOMEN AND PELVIS FINDINGS  Hepatobiliary: Normal liver. Multiple gallstones without acute cholecystitis or biliary ductal dilatation.  Pancreas: Normal pancreas for age.  Spleen: Normal  Adrenals/Urinary Tract: Normal adrenal glands. Normal kidneys, without hydronephrosis. Normal urinary bladder.  Stomach/Bowel: Normal stomach, without wall thickening. Sigmoid appears mildly thick walled on image 98 of series 2. Underdistended. Diverticula in this region. No surrounding inflammation. Normal terminal ileum. Normal small bowel.  Vascular/Lymphatic: Advanced aortic and branch vessel atherosclerosis. No abdominopelvic adenopathy.  Reproductive: Hysterectomy.  No adnexal mass.  Other: No significant free fluid. No evidence of omental or peritoneal disease. Pelvic floor laxity.  Musculoskeletal: Tarlov cysts. The left paraspinous lesion at L3-4 demonstrates peripheral enhancement and measures 2.0 x 1.8 cm on image 70 of series 2. On the biopsy study of 06/21/2014, measured 2.2 x 2.0 cm. Back on 02/08/2014, measured 1.4 x 1.1 cm.  No new soft tissue lesions are identified.  Mild osteopenia. Convex left lumbar spine curvature. L4-5 anterolisthesis which is favored to be degenerative.  IMPRESSION: 1. Since the biopsy study of 06/21/2014, similar to slight decrease in size of a left paraspinous lesion at L3-4. 2. No new sites of metastatic disease identified. 3. 8 mm ground-glass nodule in the right lower lobe is grossly similar to 03/10/2013, suggesting a benign etiology. 4. Cholelithiasis. 5. Apparent sigmoid colonic wall thickening which could be due to underdistention. Colitis felt less likely. 6.  Atherosclerosis, including within the coronary arteries.  7. Pelvic floor laxity.   Electronically Signed   By: KAbigail MiyamotoM.D.   On: 08/15/2014 10:29    Impression:  The patient is recovering from the effects of radiation. 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 Pain improved with radiation therapy as above  Plan:  Follow up in 6 months.  This document serves as a record of services personally performed by JGery Pray MD. It was created on his behalf by HPearlie Oyster a trained medical scribe. The creation of this record is based on the scribe's personal observations and the provider's statements to them. This document has been checked and approved by the attending provider.

## 2014-09-07 NOTE — Progress Notes (Signed)
[name] here for follow up.  She denies pain.  She does take Ibuprofen 800 mg occasionally with lower back pain.  She reports her appetite was decreased during radiation and is getting better now.  She denise having any bowel issues and nausea.    BP 158/68 mmHg  Pulse 55  Temp(Src) 98.1 F (36.7 C) (Oral)  Resp 12  Ht '4\' 11"'$  (1.499 m)  Wt 107 lb (48.535 kg)  BMI 21.60 kg/m2

## 2014-09-08 ENCOUNTER — Encounter: Payer: Self-pay | Admitting: Gynecologic Oncology

## 2014-09-08 ENCOUNTER — Ambulatory Visit (HOSPITAL_BASED_OUTPATIENT_CLINIC_OR_DEPARTMENT_OTHER): Payer: Medicare Other | Admitting: Gynecologic Oncology

## 2014-09-08 VITALS — BP 151/62 | HR 66 | Temp 98.5°F | Resp 18 | Ht 59.0 in | Wt 107.1 lb

## 2014-09-08 DIAGNOSIS — C7951 Secondary malignant neoplasm of bone: Secondary | ICD-10-CM | POA: Diagnosis not present

## 2014-09-08 DIAGNOSIS — C541 Malignant neoplasm of endometrium: Secondary | ICD-10-CM

## 2014-09-08 NOTE — Patient Instructions (Signed)
Follow-up with a CT scan on June 14. Please return to see Dr. Alycia Rossetti on June 16.

## 2014-09-08 NOTE — Progress Notes (Signed)
Follow Up Note: Gyn-Onc  [name] 74 y.o. female  CC:  Chief Complaint  Patient presents with  . Endometrial Cancer    Follow up    HPI:  [name] is seen for continuing attention to her recurrent endometrial carcinoma, post cycle 6 chemotherapy on 5/13. Cycle 5 was delayed a week due to counts, then taxol only given on 4-16 due to platelets of only 90K that day; she did have neulasta on 08-30-2011 (then enjoyed 2 weeks at beach). She then had radiation to persistent disease on the left pelvic side wall in 8/13.  History includes microinvasive T1N0 right breast cancer, which was ER/PR positive and HER-2 2+ with insufficient material for FISH at diagnosis in 03/1999, treated with lumpectomy with 2 sentinel node evaluation, local radiation and 5 years of Tamoxifen thru 05/2004, then aromatase inhibitor 07/2004 thru 04/2006.  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 She had 6 cycles of taxol/carboplatin through Jan 2010, and vaginal cuff brachytherapy. She did well until some vague LUQ symptoms in Nov. 2012, with CT AP in [facility] system Mar 22, 2011 showing left pelvic sidewall involvement. She was taken to laparoscopic evaluation 04-25-2011, with findings of dense fibrosis in the area as well as mass encasing obturator nerve and vein as well as internal iliac vasculature; left ureterolysis was performed. No pathology was submitted from that procedure. We proceeded with initial 3 additional cycles of taxol/ carboplatin (with carbo skin tests) from 05-16-2011 thru 06-28-2011, then repeated CT AP on 07-17-2011. The CT showed improvement in the left pelvic sidewall mass now 1.9 x 2 cm and 3.4 cm cephalocaudad, as compared with 2.5 x 2.2 cm and 5.4 cm on CT 03-22-11, and no progressive disease elsewhere. She saw me after the CT, with recommendation for an additonal 3 cycles  of same chemotherapy then repeat scan. Cycle 4 was taxol + [place], with carbo skin test and neulasta, then cycle 5 as above only taxol.  She then completed radiation therapy under the care of Dr. Sondra Come which she completed in August of 2013. She had a CT scan on March 23, 2012 that revealed:  IMPRESSION:  1. Today's study demonstrates a positive response to therapy with decreased size of left pelvic sidewall nodal masses, as detailed above. No new soft tissue masses or new lymphadenopathy is identified within the abdomen or pelvis. Continue attention on follow-up studies is recommended.  2. Cholelithiasis without findings to suggest acute cholecystitis.  3. Colonic diverticulosis without findings to suggest acute diverticulitis at this time.  4. Mild asymmetric urinary bladder wall thickening is unchanged compared to prior examinations and is nonspecific. No definite bladder wall mass is identified at this time.  5. Additional findings, similar to prior examinations, as above.   There had been another lymph node identified on her CT scan in may that measured 2.1 x 1.7 cm. In reviewing her CT report it states that the nodal mass higher up along the pelvic sidewalls is no longer clearly identified although there continues to be some amorphus soft tissue in the region with the previously noted nodal mass resided. This was confirmed with the reading radiologist and communicated to the patient.  CT 06/22/12:  Normal urinary bladder. Hysterectomy. Decreased size of the left pelvic peripherally enhancing lesion. This measures 1.8 x 1.7 cm  on image 55/series 2 versus 2.4 x 2.1 cm on  the prior exam. No new lesions are identified. No significant free fluid.  Bones/Musculoskeletal: No acute osseous abnormality. Trace L4-L5 anterolisthesis is likely degenerative. There is S-shaped lumbar  spine curvature.  IMPRESSION:  1. Further decreased size of the left pelvic peripherally enhancing lesion.  2. No new sites  of new or progressive disease.  3. Esophageal air fluid level suggests dysmotility or gastroesophageal reflux.  4. Cholelithiasis.  CT 11/02/2012:  The index lesion within the left pelvic side wall measures 2.2 x 2.7 cm, image 55/series 2. Previously this measured 1.8 x 1.7 cm. No new lesions identified. The stomach and small bowel loops appear within normal limits. Normal appearance of the proximal colon. Multiple sigmoid diverticula identified without acute inflammation. Review of the visualized bony structures shows scoliosis and multilevel degenerative disc disease within the lumbar spine.  IMPRESSION:  1. The left pelvic side wall lesion demonstrates mild increase in size from previous exam.  2. No new sites of new or progressive disease.  3. Cholelithiasis.  CT 01/28/13: FINDINGS:  Lung bases are clear. No focal hepatic lesion. The gallbladder, pancreas, spleen, adrenal glands, and kidneys are normal. There are several gallstones within the lumen of the gallbladder. The stomach, small bowel, and cecum are normal. The diverticulum sigmoid colon. Rectum is normal. Abdominal aorta is normal in caliber. No retroperitoneal or periportal lymphadenopathy. No evidence of peritoneal disease. Within the pelvis, the left pelvic sidewall lobular enhancing mass which is slightly increased in size. The lesion is similar in axial dimension measuring 30 x 21 mm compared to the 29 x 22 mm on prior remeasured. In the craniocaudad dimension, lesion measures 26 mm 1ncreased from 21 mm on prior. No additional pelvic lymphadenopathy. Hysterectomy anatomy. Vaginal cuff appears normal. Bladder is normal.  Review of bone windows demonstrates no aggressive osseous lesions.  IMPRESSION:  1. Interval small increase in volume of enhancing mass adjacent to the left pelvic sidewall.  2. No evidence of abdominal or pelvic lymphadenopathy. 74 year old with recurrent uterine papillary serous carcinoma initially stage IB  diagnosed in 2009. Most recent imaging in 01/2012 of this year reveals interval enlargement of her left pelvic sidewall mass consistent with nodal disease.  PET 03/10/13: 1. Hypermetabolic mass in the deep left pelvis consistent with metastasis.  2. No additional evidence of local metastasis. No evidence of distant metastasis.  3. Small right lower lobe pulmonary nodule is not hypermetabolic. Recommend attention on follow-up.  She underwent resection of pelvic mass, left sided ureterolysis, intra-operative radiation therapy by radiation oncology, omental J flap, and left ureteral stent placement by urology at [facility]  on 04/02/13.  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  Final pathology resulted:  Omentum, biopsy: Mature adipose tissue, fibrous tissue, calcifications and bland appearing gland consistent with endosalpingiosis.  No diagnostic carcinoma identified, pancytokeratin confirmatory (negative).  Lymph nodes, left obturator, regional resection: Aggregate of lymph node (4.1 cm) involved by poorly differentiated high grade carcinoma (see Light Microscopy).  Abundant tumor necrosis.   Interval History:  She had her cystoscopy and ureteral stent removal on 05/20/2013. She had a CT scan March 17 that revealed no evidence of recurrent disease. She comes in today for interval followup.  She had a CT scan performed on 02/05/14: IMPRESSION:  No CT findings for recurrent or metastatic disease involving the abdomen/ pelvis.  She had been having increasing issues over the past 3-4 weeks with increasing back pain. She has been told she has DJD and in the past this pain has been relieved with stress dose steroids, but this time it has not helped.   MRI lumbar spine at [facility] on 06-13-2014 showed known  scoliosis and degenerative disc disease, with new finding of 2.7 x 2.4 x 1.9 cm soft tissue mass at L3-4 involving left neural foramen, with focal cortical destruction of adjacent L3 vertebral body and portion of left L4 pedicle. She had CT biopsy on 06-21-14, with pathology ([id number]) poorly differentiated carcinoma consistent with high grade serous. Bone scan 06-22-14 had uptake posteriorly in upper lumbar spine and a subtle increased area of uptake right greater trochanter of unclear significance. IMRT by Dr Sondra Come 2-29 thru 08-12-14. First zometa 07-07-14-6/16.  Post-XRT CT scan revealed: IMPRESSION: 1. Since the biopsy study of 06/21/2014, similar to slight decrease in size of a left paraspinous lesion at L3-4. 2. No new sites of metastatic disease identified. 3. 8 mm ground-glass nodule in the right lower lobe is grossly similar to 03/10/2013, suggesting a benign etiology. 4. Cholelithiasis. 5. Apparent sigmoid colonic wall thickening which could be due to underdistention. Colitis felt less likely. 6. Atherosclerosis, including within the coronary arteries. 7. Pelvic floor laxity.  She comes in today for follow-up. She's overall feeling quite well. She does have some concerns regarding her cognitive function. She has weaned herself off her gabapentin and feels that her cognitive function may be slightly improved. Coming off the gabapentin she is also simultaneously noticed some questionable increase in neuropathy and numbness around her left knee as well as left knee pain. She is taking Naprosyn once or twice a day with good relief of her pain. She is unclear if the left knee pain is related to her bony metastasis or true true nonrelated. She is a long history of some back discomfort and posture issues and those issues still remain. The significant pain she had before has completely resolved. She has no abdominal or pelvic pain. She's had no vaginal bleeding.  Review of Systems  Constitutional:  Feels well  Cardiovascular: No chest pain, shortness of breath, or edema.  Pulmonary: No cough or wheeze.  Gastrointestinal: No nausea, vomiting, or diarrhea. No bright red blood per rectum or change in bowel movement.  Genitourinary: No frequency, urgency, or dysuria. No vaginal bleeding or discharge.  Occasional leakage  Musculoskeletal: + back pain, + left knee pain Neurologic: + numbness around left knee Psychology: No changes  Current Meds:  Outpatient Encounter Prescriptions as of 09/08/2014  Medication Sig  . amLODipine (NORVASC) 5 MG tablet Take 5 mg by mouth daily.  Marland Kitchen aspirin EC 81 MG tablet Take 81 mg by mouth daily.  Marland Kitchen atenolol (TENORMIN) 25 MG tablet Take 25 mg by mouth 2 (two) times daily.   . Biotin 5000 MCG TABS Take 5,000 mcg by mouth daily.  . cholecalciferol (VITAMIN D) 1000 UNITS tablet Take 1,000 Units by mouth every Monday, Wednesday, and Friday.  . Cyanocobalamin (VITAMIN B-12) 5000 MCG SUBL Place under the tongue.  . fish oil-omega-3 fatty acids 1000 MG capsule Take 1 g by mouth daily.    Marland Kitchen ibuprofen (ADVIL,MOTRIN) 800 MG tablet   . Magnesium 250 MG TABS Take 250 mg by mouth every Monday, Wednesday, and Friday.   . Multiple Vitamin (MULTIVITAMIN WITH MINERALS) TABS tablet Take 1 tablet by mouth daily.  . naproxen (NAPROSYN) 500 MG tablet Take  500 mg by mouth daily.   . Omega-3 Fatty Acids (FISH OIL PO) Take 360 mg by mouth daily.  . potassium gluconate 595 MG TABS tablet Take 595 mg by mouth every Monday, Wednesday, and Friday.  . simvastatin (ZOCOR) 20 MG tablet Take 20 mg by mouth daily.  Marland Kitchen ALPRAZolam (XANAX) 0.25 MG tablet Take 0.25 mg by mouth 2 (two) times daily as needed for anxiety.   . [DISCONTINUED] gabapentin (NEURONTIN) 300 MG capsule Take 300 mg by mouth 2 (two) times daily.   . [DISCONTINUED] OxyCODONE HCl, Abuse Deter, 5 MG TABA Take 10 mg by mouth every 4 (four) hours as needed.    Allergy:  Allergies  Allergen Reactions  . Ace Inhibitors  Anaphylaxis  . Codeine Nausea Only  . Demerol Nausea Only  . Dilaudid [Hydromorphone Hcl]     "total loss of her mind"    Social Hx:   History   Social History  . Marital Status: Married    Spouse Name: N/A  . Number of Children: N/A  . Years of Education: N/A   Occupational History  . Not on file.   Social History Main Topics  . Smoking status: Former Smoker    Types: Cigarettes    Quit date: 05/13/1962  . Smokeless tobacco: Never Used  . Alcohol Use: 3.0 oz/week    5 Glasses of wine per week  . Drug Use: No  . Sexual Activity: No   Other Topics Concern  . Not on file   Social History Narrative    Past Surgical Hx:  Past Surgical History  Procedure Laterality Date  . Total abdominal hysterectomy w/ bilateral salpingoophorectomy    . Breast lumpectomy    . Appendectomy    . Tonsillectomy    . Foot surgery      RIGHT    Past Medical Hx:  Past Medical History  Diagnosis Date  . Hypertension   . Palpitations   . History of breast cancer 2000    right breast -- treated with lumpectomy and radiation therarpy, postoperatively with tamoxifen   . Hyperlipidemia   . Hypercholesterolemia   . PVC's (premature ventricular contractions)   . S/P radiation therapy 10/28/11-12/05/11    5040 cGy left pelvis  . S/P radiation therapy     Intracavitary brachytherapy of uterus  . Endometrial cancer 12/2007    s/p total abdominal hysterectomy at [facility] - ovaries were also removed   . Radiation 07/11/14-08/12/14    left lumbar paraspinal area 55 gray    Family Hx:  Family History  Problem Relation Age of Onset  . Thrombosis Father     coronary thrombosis  . Hypertension Father   . Heart failure Mother   . Coronary artery disease Brother     Vitals:  Blood pressure 151/62, pulse 66, temperature 98.5 F (36.9 C), temperature source Oral, resp. rate 18, height _0  (1.499 m), weight 107 lb 1.6 oz (48.58 kg).  Physical Exam:  General: Well developed, well  nourished female in no acute distress. Alert and oriented x 3.   Neck: Supple without any enlargements.   Lymph node survey: No cervical, supraclavicular, or inguinal adenopathy.   Cardiovascular: Regular rate and rhythm.  Lungs: Clear to auscultation bilaterally.   Skin: No rashes or lesions present.  Back: No CVA tenderness. No tenderness with percussion and firm palpation along spine, + left hip pain  Abdomen: Abdomen soft, non-tender and non-obese. Active bowel sounds in all quadrants. Well-healed vertical midline  incision. No evidence of an incisional hernia.  Extremities: No bilateral cyanosis, edema, or clubbing. Left knee atraumatic, no swelling  Pelvic: External genitalia within normal limits. Bimanual examination reveals no masses or nodularity. Rectal confirms.  Assessment/Plan:  74 year old with recurrent uterine papillary serous carcinoma initially stage IB diagnosed in 2009.  She is s/p resection of pelvic mass, left sided ureterolysis, intra-operative radiation therapy by radiation oncology, omental J flap, and left ureteral stent placement by urology at [facility]  on 04/02/13.  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    We will schedule her for a CT scan in June and a visit with me. She'll complete her last Zometa in June with Dr. Marko Plume. She understands that we will be following her quite closely with imaging studies to rule out new sites of metastatic disease. We went over list of questions she and her husband had. Greater than 20 minutes face to face time with discussion alone was spent with the patient. Her questions were elicited and answered to her satisfaction.   Nary Sneed A.,  09/08/2014, 2:56 PM

## 2014-09-09 ENCOUNTER — Other Ambulatory Visit: Payer: Self-pay

## 2014-09-09 DIAGNOSIS — Z1231 Encounter for screening mammogram for malignant neoplasm of breast: Secondary | ICD-10-CM

## 2014-09-12 DIAGNOSIS — M25562 Pain in left knee: Secondary | ICD-10-CM | POA: Diagnosis not present

## 2014-09-12 DIAGNOSIS — R202 Paresthesia of skin: Secondary | ICD-10-CM | POA: Diagnosis not present

## 2014-09-13 ENCOUNTER — Telehealth: Payer: Self-pay | Admitting: *Deleted

## 2014-09-13 NOTE — Telephone Encounter (Signed)
Called patient to give her new appt times for CT scan and f/u appt with Dr. Alycia Rossetti. Patient agreeable to CT scan 5/27 at 9:30am and to see Dr. Alycia Rossetti on 10/11/14 at 2pm. Told patient to please call us back with any further questions or concerns prior to these appts.

## 2014-09-22 ENCOUNTER — Ambulatory Visit: Payer: Medicare Other | Admitting: Radiation Oncology

## 2014-10-07 ENCOUNTER — Ambulatory Visit (HOSPITAL_COMMUNITY)
Admission: RE | Admit: 2014-10-07 | Discharge: 2014-10-07 | Disposition: A | Discharge status: Home / Self Care | Payer: Medicare Other | Source: Ambulatory Visit | Attending: Gynecologic Oncology | Admitting: Gynecologic Oncology

## 2014-10-07 ENCOUNTER — Encounter (HOSPITAL_COMMUNITY): Payer: Self-pay

## 2014-10-07 DIAGNOSIS — C541 Malignant neoplasm of endometrium: Secondary | ICD-10-CM | POA: Diagnosis not present

## 2014-10-07 DIAGNOSIS — K8020 Calculus of gallbladder without cholecystitis without obstruction: Secondary | ICD-10-CM | POA: Insufficient documentation

## 2014-10-07 DIAGNOSIS — R911 Solitary pulmonary nodule: Secondary | ICD-10-CM | POA: Diagnosis not present

## 2014-10-07 DIAGNOSIS — Z9221 Personal history of antineoplastic chemotherapy: Secondary | ICD-10-CM | POA: Diagnosis not present

## 2014-10-07 DIAGNOSIS — K5730 Diverticulosis of large intestine without perforation or abscess without bleeding: Secondary | ICD-10-CM | POA: Insufficient documentation

## 2014-10-07 DIAGNOSIS — Z8589 Personal history of malignant neoplasm of other organs and systems: Secondary | ICD-10-CM | POA: Diagnosis present

## 2014-10-07 DIAGNOSIS — Z923 Personal history of irradiation: Secondary | ICD-10-CM | POA: Diagnosis not present

## 2014-10-07 DIAGNOSIS — K5720 Diverticulitis of large intestine with perforation and abscess without bleeding: Secondary | ICD-10-CM | POA: Diagnosis not present

## 2014-10-07 MED ORDER — IOHEXOL 300 MG/ML  SOLN
100.0000 mL | Freq: Once | INTRAMUSCULAR | Status: AC | PRN
  Administered 2014-10-07: 80 mL via INTRAVENOUS

## 2014-10-11 ENCOUNTER — Encounter: Payer: Self-pay | Admitting: Gynecologic Oncology

## 2014-10-11 ENCOUNTER — Ambulatory Visit: Payer: Medicare Other | Attending: Gynecologic Oncology | Admitting: Gynecologic Oncology

## 2014-10-11 DIAGNOSIS — C541 Malignant neoplasm of endometrium: Secondary | ICD-10-CM | POA: Insufficient documentation

## 2014-10-11 DIAGNOSIS — Z923 Personal history of irradiation: Secondary | ICD-10-CM | POA: Insufficient documentation

## 2014-10-11 DIAGNOSIS — Z87891 Personal history of nicotine dependence: Secondary | ICD-10-CM | POA: Insufficient documentation

## 2014-10-11 DIAGNOSIS — Z853 Personal history of malignant neoplasm of breast: Secondary | ICD-10-CM | POA: Insufficient documentation

## 2014-10-11 DIAGNOSIS — Z9221 Personal history of antineoplastic chemotherapy: Secondary | ICD-10-CM | POA: Insufficient documentation

## 2014-10-11 DIAGNOSIS — Z90710 Acquired absence of both cervix and uterus: Secondary | ICD-10-CM | POA: Insufficient documentation

## 2014-10-11 DIAGNOSIS — G629 Polyneuropathy, unspecified: Secondary | ICD-10-CM | POA: Insufficient documentation

## 2014-10-11 DIAGNOSIS — R911 Solitary pulmonary nodule: Secondary | ICD-10-CM | POA: Insufficient documentation

## 2014-10-11 DIAGNOSIS — C7951 Secondary malignant neoplasm of bone: Secondary | ICD-10-CM

## 2014-10-11 NOTE — Progress Notes (Signed)
Follow Up Note: Gyn-Onc  [name] 74 y.o. female  CC:  Chief Complaint  Patient presents with  . endometrial cancer    HPI:  [name] is seen for continuing attention to her recurrent endometrial carcinoma, post cycle 6 chemotherapy on 5/13. Cycle 5 was delayed a week due to counts, then taxol only given on 4-16 due to platelets of only 90K that day; she did have neulasta on 08-30-2011 (then enjoyed 2 weeks at beach). She then had radiation to persistent disease on the left pelvic side wall in 8/13.  History includes microinvasive T1N0 right breast cancer, which was ER/PR positive and HER-2 2+ with insufficient material for FISH at diagnosis in 03/1999, treated with lumpectomy with 2 sentinel node evaluation, local radiation and 5 years of Tamoxifen thru 05/2004, then aromatase inhibitor 07/2004 thru 04/2006.  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 She had 6 cycles of taxol/carboplatin through Jan 2010, and vaginal cuff brachytherapy. She did well until some vague LUQ symptoms in Nov. 2012, with CT AP in [facility] system Mar 22, 2011 showing left pelvic sidewall involvement. She was taken to laparoscopic evaluation 04-25-2011, with findings of dense fibrosis in the area as well as mass encasing obturator nerve and vein as well as internal iliac vasculature; left ureterolysis was performed. No pathology was submitted from that procedure. We proceeded with initial 3 additional cycles of taxol/ carboplatin (with carbo skin tests) from 05-16-2011 thru 06-28-2011, then repeated CT AP on 07-17-2011. The CT showed improvement in the left pelvic sidewall mass now 1.9 x 2 cm and 3.4 cm cephalocaudad, as compared with 2.5 x 2.2 cm and 5.4 cm on CT 03-22-11, and no progressive disease elsewhere. She saw me after the CT, with recommendation for an additonal 3 cycles of same  chemotherapy then repeat scan. Cycle 4 was taxol + [place], with carbo skin test and neulasta, then cycle 5 as above only taxol.  She then completed radiation therapy under the care of Dr. Sondra Come which she completed in August of 2013. She had a CT scan on March 23, 2012 that revealed:  IMPRESSION:  1. Today's study demonstrates a positive response to therapy with decreased size of left pelvic sidewall nodal masses, as detailed above. No new soft tissue masses or new lymphadenopathy is identified within the abdomen or pelvis. Continue attention on follow-up studies is recommended.  2. Cholelithiasis without findings to suggest acute cholecystitis.  3. Colonic diverticulosis without findings to suggest acute diverticulitis at this time.  4. Mild asymmetric urinary bladder wall thickening is unchanged compared to prior examinations and is nonspecific. No definite bladder wall mass is identified at this time.  5. Additional findings, similar to prior examinations, as above.   There had been another lymph node identified on her CT scan in may that measured 2.1 x 1.7 cm. In reviewing her CT report it states that the nodal mass higher up along the pelvic sidewalls is no longer clearly identified although there continues to be some amorphus soft tissue in the region with the previously noted nodal mass resided. This was confirmed with the reading radiologist and communicated to the patient.  CT 06/22/12:  Normal urinary bladder. Hysterectomy. Decreased size of the left pelvic peripherally enhancing lesion. This measures 1.8 x 1.7 cm  on image 55/series 2 versus 2.4 x 2.1 cm on the prior exam. No new  lesions are identified. No significant free fluid.  Bones/Musculoskeletal: No acute osseous abnormality. Trace L4-L5 anterolisthesis is likely degenerative. There is S-shaped lumbar  spine curvature.  IMPRESSION:  1. Further decreased size of the left pelvic peripherally enhancing lesion.  2. No new sites of new  or progressive disease.  3. Esophageal air fluid level suggests dysmotility or gastroesophageal reflux.  4. Cholelithiasis.  CT 11/02/2012:  The index lesion within the left pelvic side wall measures 2.2 x 2.7 cm, image 55/series 2. Previously this measured 1.8 x 1.7 cm. No new lesions identified. The stomach and small bowel loops appear within normal limits. Normal appearance of the proximal colon. Multiple sigmoid diverticula identified without acute inflammation. Review of the visualized bony structures shows scoliosis and multilevel degenerative disc disease within the lumbar spine.  IMPRESSION:  1. The left pelvic side wall lesion demonstrates mild increase in size from previous exam.  2. No new sites of new or progressive disease.  3. Cholelithiasis.  CT 01/28/13: FINDINGS:  Lung bases are clear. No focal hepatic lesion. The gallbladder, pancreas, spleen, adrenal glands, and kidneys are normal. There are several gallstones within the lumen of the gallbladder. The stomach, small bowel, and cecum are normal. The diverticulum sigmoid colon. Rectum is normal. Abdominal aorta is normal in caliber. No retroperitoneal or periportal lymphadenopathy. No evidence of peritoneal disease. Within the pelvis, the left pelvic sidewall lobular enhancing mass which is slightly increased in size. The lesion is similar in axial dimension measuring 30 x 21 mm compared to the 29 x 22 mm on prior remeasured. In the craniocaudad dimension, lesion measures 26 mm 1ncreased from 21 mm on prior. No additional pelvic lymphadenopathy. Hysterectomy anatomy. Vaginal cuff appears normal. Bladder is normal.  Review of bone windows demonstrates no aggressive osseous lesions.  IMPRESSION:  1. Interval small increase in volume of enhancing mass adjacent to the left pelvic sidewall.  2. No evidence of abdominal or pelvic lymphadenopathy. 74 year old with recurrent uterine papillary serous carcinoma initially stage IB diagnosed in  2009. Most recent imaging in 01/2012 of this year reveals interval enlargement of her left pelvic sidewall mass consistent with nodal disease.  PET 03/10/13: 1. Hypermetabolic mass in the deep left pelvis consistent with metastasis.  2. No additional evidence of local metastasis. No evidence of distant metastasis.  3. Small right lower lobe pulmonary nodule is not hypermetabolic. Recommend attention on follow-up.  She underwent resection of pelvic mass, left sided ureterolysis, intra-operative radiation therapy by radiation oncology, omental J flap, and left ureteral stent placement by urology at [facility]  on 04/02/13.  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  Final pathology resulted:  Omentum, biopsy: Mature adipose tissue, fibrous tissue, calcifications and bland appearing gland consistent with endosalpingiosis.  No diagnostic carcinoma identified, pancytokeratin confirmatory (negative).  Lymph nodes, left obturator, regional resection: Aggregate of lymph node (4.1 cm) involved by poorly differentiated high grade carcinoma (see Light Microscopy).  Abundant tumor necrosis.   She had her cystoscopy and ureteral stent removal on 05/20/2013. She had a CT scan March 17 that revealed no evidence of recurrent disease. She comes in today for interval followup.  She had a CT scan performed on 02/05/14: IMPRESSION:  No CT findings for recurrent or metastatic disease involving the abdomen/ pelvis.  She had been having increasing issues over  the past 3-4 weeks with increasing back pain. She has been told she has DJD and in the past this pain has been relieved with stress dose steroids, but this time it has not helped.   MRI lumbar spine at [facility] on 06-13-2014 showed known scoliosis and degenerative disc disease,  with new finding of 2.7 x 2.4 x 1.9 cm soft tissue mass at L3-4 involving left neural foramen, with focal cortical destruction of adjacent L3 vertebral body and portion of left L4 pedicle. She had CT biopsy on 06-21-14, with pathology ([id number]) poorly differentiated carcinoma consistent with high grade serous. Bone scan 06-22-14 had uptake posteriorly in upper lumbar spine and a subtle increased area of uptake right greater trochanter of unclear significance. IMRT by Dr Sondra Come 2-29 thru 08-12-14. First zometa 07-07-14-6/16.  Post-XRT CT scan revealed: IMPRESSION: 1. Since the biopsy study of 06/21/2014, similar to slight decrease in size of a left paraspinous lesion at L3-4. 2. No new sites of metastatic disease identified. 3. 8 mm ground-glass nodule in the right lower lobe is grossly similar to 03/10/2013, suggesting a benign etiology. 4. Cholelithiasis. 5. Apparent sigmoid colonic wall thickening which could be due to underdistention. Colitis felt less likely. 6. Atherosclerosis, including within the coronary arteries. 7. Pelvic floor laxity.  Interval History:  CT 10/07/14: Musculoskeletal: Stable to slight interval decrease in size of left paraspinous lesion at the L3-4 level with peripheral enhancement measuring 1.6 x 1.7 cm (image 72; series 2), previously 2.0 x 1.8 cm.  IMPRESSION: Stable to slight interval decrease in size of left paraspinous lesion at L3-4.  No new sites of metastatic disease.  Unchanged ground-glass nodule within the right lower lobe, potentially benign in etiology. Recommend attention on followup.  Cholelithiasis.  Sigmoid colonic diverticulosis. No CT evidence to suggest acute diverticulitis.  She comes in today for follow-up. She was seen the results of the CT scan as I will be sent to her this morning. She was very happy about this. She believes that from a cognitive perspective and she is much better. She states that she is no longer feeling "fuzzy or  aloof". She is walking 3 miles a day. She does have some mild residual neuropathy in her feet but otherwise completely asymptomatic. She was having some numbness down towards her left knee. She was seen by orthopedics and an x-ray was done that did not reveal any arthritis. Was felt to be nerve issue secondary from the paraspinal disease in her back. She states that those symptoms very dated today. It does not appear to be made worse by exercise.  Review of Systems  Constitutional: Feels well  Cardiovascular: No chest pain, shortness of breath, or edema.  Pulmonary: No cough or wheeze.  Gastrointestinal: No nausea, vomiting, or diarrhea. No bright red blood per rectum or change in bowel movement.  Genitourinary: No frequency, urgency, or dysuria. No vaginal bleeding or discharge.  Occasional leakage  Musculoskeletal: + back pain, + left knee symptoms as above Neurologic: + numbness around left knee Psychology: No changes  Current Meds:  Outpatient Encounter Prescriptions as of 10/11/2014  Medication Sig  . amLODipine (NORVASC) 5 MG tablet Take 5 mg by mouth daily.  Marland Kitchen aspirin EC 81 MG tablet Take 81 mg by mouth daily.  Marland Kitchen atenolol (TENORMIN) 25 MG tablet Take 25 mg by mouth 2 (two) times daily.   . Biotin 5000 MCG TABS Take 5,000 mcg by mouth daily.  . cholecalciferol (VITAMIN D) 1000 UNITS tablet Take 1,000 Units  by mouth every Monday, Wednesday, and Friday.  . Cyanocobalamin (VITAMIN B-12) 5000 MCG SUBL Place 1 tablet under the tongue daily.   . Magnesium 250 MG TABS Take 250 mg by mouth every Monday, Wednesday, and Friday.   . Multiple Vitamin (MULTIVITAMIN WITH MINERALS) TABS tablet Take 1 tablet by mouth daily.  . naproxen (NAPROSYN) 500 MG tablet Take 500 mg by mouth daily.   . Omega-3 Fatty Acids (FISH OIL PO) Take 360 mg by mouth daily.  . potassium gluconate 595 MG TABS tablet Take 595 mg by mouth every Monday, Wednesday, and Friday.  . simvastatin (ZOCOR) 20 MG tablet Take 20 mg by  mouth daily.  Marland Kitchen ALPRAZolam (XANAX) 0.25 MG tablet Take 0.25 mg by mouth 2 (two) times daily as needed for anxiety.   Marland Kitchen ibuprofen (ADVIL,MOTRIN) 800 MG tablet   . [DISCONTINUED] fish oil-omega-3 fatty acids 1000 MG capsule Take 1 g by mouth daily.     No facility-administered encounter medications on file as of 10/11/2014.    Allergy:  Allergies  Allergen Reactions  . Ace Inhibitors Anaphylaxis  . Codeine Nausea Only  . Demerol Nausea Only  . Dilaudid [Hydromorphone Hcl]     "total loss of her mind"    Social Hx:   History   Social History  . Marital Status: Married    Spouse Name: N/A  . Number of Children: N/A  . Years of Education: N/A   Occupational History  . Not on file.   Social History Main Topics  . Smoking status: Former Smoker    Types: Cigarettes    Quit date: 05/13/1962  . Smokeless tobacco: Never Used  . Alcohol Use: 3.0 oz/week    5 Glasses of wine per week  . Drug Use: No  . Sexual Activity: No   Other Topics Concern  . Not on file   Social History Narrative    Past Surgical Hx:  Past Surgical History  Procedure Laterality Date  . Total abdominal hysterectomy w/ bilateral salpingoophorectomy    . Breast lumpectomy    . Appendectomy    . Tonsillectomy    . Foot surgery      RIGHT    Past Medical Hx:  Past Medical History  Diagnosis Date  . Hypertension   . Palpitations   . History of breast cancer 2000    right breast -- treated with lumpectomy and radiation therarpy, postoperatively with tamoxifen   . Hyperlipidemia   . Hypercholesterolemia   . PVC's (premature ventricular contractions)   . S/P radiation therapy 10/28/11-12/05/11    5040 cGy left pelvis  . S/P radiation therapy     Intracavitary brachytherapy of uterus  . Radiation 07/11/14-08/12/14    left lumbar paraspinal area 55 gray  . Endometrial cancer 12/2007    s/p total abdominal hysterectomy at [facility] - ovaries were also removed     Family Hx:  Family History   Problem Relation Age of Onset  . Thrombosis Father     coronary thrombosis  . Hypertension Father   . Heart failure Mother   . Coronary artery disease Brother     Vitals:  There were no vitals taken for this visit.  Physical Exam:  General: Well developed, well nourished female in no acute distress. Alert and oriented x 3.   Neck: Supple without any enlargements.   Lymph node survey: No cervical, supraclavicular, or inguinal adenopathy.   Cardiovascular: Regular rate and rhythm.  Lungs: Clear to auscultation bilaterally.  Skin: No rashes or lesions present.  Abdomen: Abdomen soft, non-tender and non-obese. Active bowel sounds in all quadrants. Well-healed vertical midline incision. No evidence of an incisional hernia.  Extremities: No bilateral cyanosis, edema, or clubbing. Left knee atraumatic, no swelling  Pelvic: External genitalia within normal limits. Speculum examination was an atrophic vagina. The vaginal cuff is visualized. There are no gross visible lesions. Bimanual examination reveals no masses or nodularity. Rectal confirms.  Assessment/Plan:  74 year old with recurrent uterine papillary serous carcinoma initially stage IB diagnosed in 2009.  She is s/p resection of pelvic mass, left sided ureterolysis, intra-operative radiation therapy by radiation oncology, omental J flap, and left ureteral stent placement by urology at [facility]  on 04/02/13.  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   She then unfortunately suffered a recurrence in the left paraspinal space and was treated with Zometa as well as IM RT. Imaging today shows that the lesion is smaller than it was in April. She is very pleased.  She'll complete her last Zometa in June with Dr. Marko Plume. She understands that we will be  following her quite closely with imaging studies to rule out new sites of metastatic disease. She has follow-up scheduled with Dr. Sondra Come. We'll plan a repeat CT scan in September. I will notify her of those results. Pending that these results are all unremarkable, she will follow-up with me in November or December.  Daud Cayer A.,  10/11/2014, 2:24 PM

## 2014-10-11 NOTE — Patient Instructions (Signed)
Please follow-up with Dr. Sondra Come and Dr. Marko Plume as scheduled. We will get a repeat CT scan in another 2-3 months. I have placed that order. Pending all your other evaluations are unremarkable, I will see you in November.

## 2014-10-12 ENCOUNTER — Ambulatory Visit (HOSPITAL_COMMUNITY): Payer: Medicare Other

## 2014-10-13 ENCOUNTER — Ambulatory Visit: Payer: Medicare Other | Admitting: Gynecologic Oncology

## 2014-10-18 ENCOUNTER — Ambulatory Visit
Admission: RE | Admit: 2014-10-18 | Discharge: 2014-10-18 | Disposition: A | Discharge status: Home / Self Care | Payer: Medicare Other | Source: Ambulatory Visit

## 2014-10-18 DIAGNOSIS — Z1231 Encounter for screening mammogram for malignant neoplasm of breast: Secondary | ICD-10-CM

## 2014-10-23 ENCOUNTER — Other Ambulatory Visit: Payer: Self-pay | Admitting: Oncology

## 2014-10-23 DIAGNOSIS — C7951 Secondary malignant neoplasm of bone: Secondary | ICD-10-CM

## 2014-10-23 MED ORDER — SODIUM CHLORIDE 0.9 % IV SOLN: 3.0000 mg | Freq: Once | INTRAVENOUS | Status: DC

## 2014-10-24 ENCOUNTER — Telehealth: Payer: Self-pay | Admitting: Oncology

## 2014-10-24 ENCOUNTER — Ambulatory Visit (HOSPITAL_BASED_OUTPATIENT_CLINIC_OR_DEPARTMENT_OTHER): Payer: Medicare Other

## 2014-10-24 ENCOUNTER — Encounter: Payer: Self-pay | Admitting: Oncology

## 2014-10-24 ENCOUNTER — Other Ambulatory Visit (HOSPITAL_BASED_OUTPATIENT_CLINIC_OR_DEPARTMENT_OTHER): Payer: Medicare Other

## 2014-10-24 ENCOUNTER — Ambulatory Visit (HOSPITAL_BASED_OUTPATIENT_CLINIC_OR_DEPARTMENT_OTHER): Payer: Medicare Other | Admitting: Oncology

## 2014-10-24 VITALS — BP 138/59 | HR 78 | Temp 97.8°F | Resp 18 | Ht 59.0 in | Wt 107.1 lb

## 2014-10-24 DIAGNOSIS — C7951 Secondary malignant neoplasm of bone: Secondary | ICD-10-CM

## 2014-10-24 DIAGNOSIS — Z853 Personal history of malignant neoplasm of breast: Secondary | ICD-10-CM | POA: Diagnosis not present

## 2014-10-24 DIAGNOSIS — Z170 Estrogen receptor positive status [ER+]: Secondary | ICD-10-CM | POA: Diagnosis not present

## 2014-10-24 DIAGNOSIS — C541 Malignant neoplasm of endometrium: Secondary | ICD-10-CM

## 2014-10-24 DIAGNOSIS — C50911 Malignant neoplasm of unspecified site of right female breast: Secondary | ICD-10-CM

## 2014-10-24 LAB — COMPREHENSIVE METABOLIC PANEL (CC13)
ALT: 17 U/L (ref 0–55)
AST: 31 U/L (ref 5–34)
Albumin: 3.6 g/dL (ref 3.5–5.0)
Alkaline Phosphatase: 39 U/L — ABNORMAL LOW (ref 40–150)
Anion Gap: 12 mEq/L — ABNORMAL HIGH (ref 3–11)
BUN: 23 mg/dL (ref 7.0–26.0)
CO2: 22 mEq/L (ref 22–29)
Calcium: 9.1 mg/dL (ref 8.4–10.4)
Chloride: 108 mEq/L (ref 98–109)
Creatinine: 0.8 mg/dL (ref 0.6–1.1)
EGFR: 72 mL/min/{1.73_m2} — ABNORMAL LOW (ref >90)
Glucose: 119 mg/dl (ref 70–140)
Potassium: 3.7 mEq/L (ref 3.5–5.1)
Sodium: 142 mEq/L (ref 136–145)
Total Bilirubin: 0.78 mg/dL (ref 0.20–1.20)
Total Protein: 6.7 g/dL (ref 6.4–8.3)

## 2014-10-24 LAB — CBC WITH DIFFERENTIAL/PLATELET
BASO%: 1 % (ref 0.0–2.0)
Basophils Absolute: 0.1 10*3/uL (ref 0.0–0.1)
EOS%: 10.1 % — ABNORMAL HIGH (ref 0.0–7.0)
Eosinophils Absolute: 0.6 10*3/uL — ABNORMAL HIGH (ref 0.0–0.5)
HCT: 37.6 % (ref 34.8–46.6)
HGB: 12.4 g/dL (ref 11.6–15.9)
LYMPH%: 18.5 % (ref 14.0–49.7)
MCH: 31.9 pg (ref 25.1–34.0)
MCHC: 33 g/dL (ref 31.5–36.0)
MCV: 96.6 fL (ref 79.5–101.0)
MONO#: 0.5 10*3/uL (ref 0.1–0.9)
MONO%: 9.5 % (ref 0.0–14.0)
NEUT#: 3.3 10*3/uL (ref 1.5–6.5)
NEUT%: 60.9 % (ref 38.4–76.8)
Platelets: 190 10*3/uL (ref 145–400)
RBC: 3.89 10*6/uL (ref 3.70–5.45)
RDW: 14.7 % — ABNORMAL HIGH (ref 11.2–14.5)
WBC: 5.5 10*3/uL (ref 3.9–10.3)
lymph#: 1 10*3/uL (ref 0.9–3.3)

## 2014-10-24 MED ORDER — SODIUM CHLORIDE 0.9 % IV SOLN
INTRAVENOUS | Status: DC
Start: 2014-10-24 — End: 2014-10-24
  Administered 2014-10-24: 15:00:00 via INTRAVENOUS

## 2014-10-24 MED ORDER — ZOLEDRONIC ACID 4 MG/5ML IV CONC
3.0000 mg | Freq: Once | INTRAVENOUS | Status: AC
  Administered 2014-10-24: 3 mg via INTRAVENOUS
  Filled 2014-10-24: qty 3.75

## 2014-10-24 MED ORDER — NAPROXEN 500 MG PO TABS: 500.0000 mg | ORAL_TABLET | Freq: Every day | ORAL | Status: DC

## 2014-10-24 NOTE — Patient Instructions (Signed)

## 2014-10-24 NOTE — Telephone Encounter (Signed)
Gave calendar for September.

## 2014-10-24 NOTE — Progress Notes (Signed)
OFFICE PROGRESS NOTE   October 24, 2014   Physicians: P.Gehrig, J.Kinard, (T.Brackbill) J.Russo, S.Ganem  INTERVAL HISTORY:  Patient is seen, alone for visit, prior to #4 zometa which is being used for metastatic endometrial cancer involving L3-4. She has had good local response to radiation and plan is to follow with close observation after zometa today. She had CT CAP 10-07-14 with decrease in the left paraspinous mass and no new disease. She saw Dr Alycia Rossetti on 10-11-14, with next appointment in EMR in 6 months. She saw Dr Sondra Come in April, is to have repeat CT on 01-26-15, and will see him in October.  She had physical exam with Dr Virgina Jock in April, yearly visits with him.   She also had T1N0 right breast cancer 03-1999; last tomo screening mammograms 10-18-14 at [facility] with extremely dense tissue otherwise negative.   Patient has felt very well over past several weeks. She is walking ~ 2 miles daily, takes naprosyn 500 mg once daily prn for chronic back pain, this back to baseline of past several years (significant scoliosis). Appetite and energy are good, no new or different pain, no SOB, no abdominal or pelvic discomfort. No changes noted in breasts. No dental concerns.  No central catheter. She enjoyed recent beach trip  ONCOLOGIC HISTORY   Malignant neoplasm of corpus uteri, except isthmus   12/22/2007 Surgery Staging for IA UPSC    - 05/28/2008 Chemotherapy 6 cycles of paclitaxel and carboplatin with vaginal cuff brachytherapy   03/22/2011 Relapse/Recurrence Left pelvic node recurrence   04/25/2011 Surgery L/S evaluation, not resectable.   05/16/2011 - 09/25/2011 Chemotherapy 6 cyclces paclitaxel and carboplatin    - 12/27/2011 Radiation Therapy radiation completed   03/10/2013 Progression progression left pelvic mass   04/02/2013 Surgery Resection and IORT   06/13/2014 Relapse/Recurrence soft tissue mass at L3-4   07/11/2014 - 08/12/2014 Radiation Therapy IMRT + zometa    Endometrial cancer    12/01/2007 Initial Diagnosis Endometrial cancer   12/15/2007 Surgery    01/22/2008 -  Chemotherapy 6 cycles paclitaxol and carboplatin    Radiation Therapy HDR   03/31/2011 Relapse/Recurrence chemotherapy and external beam radiation   03/10/2013 Progression    04/02/2013 Surgery pelvic mass resection with IORT    Metastatic cancer to bone   06/27/2014 Initial Diagnosis Metastatic cancer to bone   BREAST CANCER: microinvasive T1N0 right breast cancer which was ER/PR positive and HER-2 2+ with insufficient material for FISH at diagnosis in 03/1999, treated with lumpectomy with 2 sentinel node evaluation, local radiation and 5 years of Tamoxifen thru 05/2004, then aromatase inhibitor 07/2004 thru 04/2006.   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 She had 6 cycles of taxol/carboplatin through Jan 2010, and vaginal cuff brachytherapy. She did well until some vague LUQ symptoms in Nov. 2012, with CT AP in [facility] system Mar 22, 2011 showing left pelvic sidewall involvement. She had laparoscopic evaluation at [facility] by [place] 04-25-2011, with finding of dense fibrosis in the area as well as mass encasing obturator nerve and vein as well as internal iliac vasculature; left ureterolysis was performed. No pathology was submitted from that procedure. She received 3 additional cycles of taxol/ carboplatin from 05-16-2011 thru 06-28-2011. CT AP on 07-17-2011 showed improvement in the left pelvic sidewall mass now 1.9 x 2 cm and 3.4 cm cephalocaudad, as compared with 2.5 x 2.2 cm and 5.4 cm on CT  03-22-11, and no progressive disease elsewhere. She saw Dr.Gehrig after the CT, with recommendation for an additonal 3 cycles of same chemotherapy then repeat scan. Cycle 4 was taxol + carbo and neulasta, cycle 5 taxol only due to counts and cycle 6 taxol + carboplatin on 09-17-11. Follow up CT  in May 2013 showed enlargement of the left pelvic sidewall involvement. She had IMRT 50 GY in 28 fractions by Dr Sondra Come, that completed 12-05-11. She had regular follow up scans, Including CT AP 01-2014 and PET 02-2013, which did not show progressive disease. She has scoliosis and some chronic intermittent low back discomfort, but developed different, persistent discomfort low back and radiating to LLE by ~ Dec into Jan 2016. MRI lumbar spine at [facility] on 06-13-2014 showed known scoliosis and degenerative disc disease, with new finding of 2.7 x 2.4 x 1.9 cm soft tissue mass at L3-4 involving left neural foramen, with focal cortical destruction of adjacent L3 vertebral body and portion of left L4 pedicle. She had CT biopsy on 06-21-14, with pathology ([id number]) poorly differentiated carcinoma consistent with high grade serous. Bone scan 06-22-14 had uptake posteriorly in upper lumbar spine and a subtle increased area of uptake right greater trochanter of unclear significance. IMRT by Dr Sondra Come 2-29 thru 08-12-14. First zometa 07-07-14.     Review of systems as above, also: No recent infectious illness. No LE swelling. Bowels moving regualrly without laxatives. Bladder ok. No Respiratory symptoms. Remainder of 10 point Review of Systems negative.  Objective:  Vital signs in last 24 hours:  BP 138/59 mmHg  Pulse 78  Temp(Src) 97.8 F (36.6 C) (Oral)  Resp 18  Ht $R'4\' 11"'Bj$  (1.499 m)  Wt 107 lb 1.6 oz (48.58 kg)  BMI 21.62 kg/m2 Weight stable Alert, oriented and appropriate. Easily and quickly ambulatory. Looks comfortable No alopecia  HEENT:PERRL, sclerae not icteric. Oral mucosa moist without lesions, posterior pharynx clear.  Neck supple. No JVD.  Lymphatics:no cervical,supaclavicular, axillary or inguinal adenopathy Resp: clear to auscultation bilaterally and normal percussion bilaterally Cardio: regular rate and rhythm. No gallop. GI: soft, nontender, not distended, no mass or  organomegaly. Normally active bowel sounds. Surgical incision not remarkable. Musculoskeletal/ Extremities: without pitting edema, cords, tenderness Neuro: no peripheral neuropathy. Otherwise nonfocal Skin without rash, ecchymosis, petechiae Breasts:Right lumpectomy scar laterally not remarkable, otherwise bilaterally without dominant mass, skin or nipple findings. Axillae benign.   Lab Results:  Results for orders placed or performed in visit on 10/24/14  CBC with Differential  Result Value Ref Range   WBC 5.5 3.9 - 10.3 10e3/uL   NEUT# 3.3 1.5 - 6.5 10e3/uL   HGB 12.4 11.6 - 15.9 g/dL   HCT 37.6 34.8 - 46.6 %   Platelets 190 145 - 400 10e3/uL   MCV 96.6 79.5 - 101.0 fL   MCH 31.9 25.1 - 34.0 pg   MCHC 33.0 31.5 - 36.0 g/dL   RBC 3.89 3.70 - 5.45 10e6/uL   RDW 14.7 (H) 11.2 - 14.5 %   lymph# 1.0 0.9 - 3.3 10e3/uL   MONO# 0.5 0.1 - 0.9 10e3/uL   Eosinophils Absolute 0.6 (H) 0.0 - 0.5 10e3/uL   Basophils Absolute 0.1 0.0 - 0.1 10e3/uL   NEUT% 60.9 38.4 - 76.8 %   LYMPH% 18.5 14.0 - 49.7 %   MONO% 9.5 0.0 - 14.0 %   EOS% 10.1 (H) 0.0 - 7.0 %   BASO% 1.0 0.0 - 2.0 %  Comprehensive metabolic panel (Cmet) - [facility]  Result Value  Ref Range   Sodium 142 136 - 145 mEq/L   Potassium 3.7 3.5 - 5.1 mEq/L   Chloride 108 98 - 109 mEq/L   CO2 22 22 - 29 mEq/L   Glucose 119 70 - 140 mg/dl   BUN 23.0 7.0 - 26.0 mg/dL   Creatinine 0.8 0.6 - 1.1 mg/dL   Total Bilirubin 0.78 0.20 - 1.20 mg/dL   Alkaline Phosphatase 39 (L) 40 - 150 U/L   AST 31 5 - 34 U/L   ALT 17 0 - 55 U/L   Total Protein 6.7 6.4 - 8.3 g/dL   Albumin 3.6 3.5 - 5.0 g/dL   Calcium 9.1 8.4 - 10.4 mg/dL   Anion Gap 12 (H) 3 - 11 mEq/L   EGFR 72 (L) >90 ml/min/1.73 m2     Studies/Results: DIGITAL SCREENING BILATERAL MAMMOGRAM WITH 3D TOMO WITH CAD  COMPARISON: Previous exam(s).  ACR Breast Density Category d: The breast tissue is extremely dense, which lowers the sensitivity of mammography.  FINDINGS: There are  no findings suspicious for malignancy. Images were processed with CAD.  IMPRESSION: No mammographic evidence of malignancy. A result letter of this screening mammogram will be mailed directly to the patient.  RECOMMENDATION: Screening mammogram in one year. (Code:[id number]-01Y)  BI-RADS CATEGORY 1: Negative   Medications: I have reviewed the patient's current medications.  DISCUSSION: reminded her to push fluids after zometa. As she has CT in mid Sept and no other MD appointments until mid Oct, I will see her back shortly after that CT (note she has trip later in Sept). She knows to contact MD if any concerns prior to scheduled visits.  Assessment/Plan:  1.Papillary serous endometrial carcinoma IB at diagnosis Aug 2009, recurrent at left pelvic sidewall 03-2011 and recent progression in area of left lumbar spine: Pain has resolved with radiation. Will hold zometa after #4 is given today.  For follow up CT 01-26-15 and I will see her shortly after that; other appointments with Drs Alycia Rossetti and Sondra Come as noted.  2.T1N0 right breast cancer 03-1999: adjuvant therapy included 5 years of tamoxifen. Not recurrent. Up to date on mammograms as of 10-18-14, tomo medically appropriate due to density of breast tissue 3.scoliosis of lumbar spine with degenerative disc disease and chronic symptoms related to this. 4.HTN followed by Dr Virgina Jock   All questions answered. Zometa orders confirmed. I will see her back at least Sept. Time spent 25 min including >50% counseling and coordination of care.   Nahara Dona P, MD   10/24/2014, 2:21 PM

## 2014-10-25 ENCOUNTER — Ambulatory Visit (HOSPITAL_COMMUNITY): Payer: Medicare Other

## 2014-10-26 ENCOUNTER — Other Ambulatory Visit: Payer: Self-pay | Admitting: Oncology

## 2014-11-07 ENCOUNTER — Other Ambulatory Visit: Payer: Self-pay

## 2014-11-28 ENCOUNTER — Other Ambulatory Visit: Payer: Self-pay | Admitting: Oncology

## 2014-12-08 ENCOUNTER — Telehealth: Payer: Self-pay | Admitting: Oncology

## 2014-12-08 NOTE — Telephone Encounter (Signed)
Spoke with patient and she is aware of her new dr Marko Plume appointment

## 2014-12-12 DIAGNOSIS — Z0100 Encounter for examination of eyes and vision without abnormal findings: Secondary | ICD-10-CM | POA: Diagnosis not present

## 2014-12-12 DIAGNOSIS — H40013 Open angle with borderline findings, low risk, bilateral: Secondary | ICD-10-CM | POA: Diagnosis not present

## 2015-01-17 ENCOUNTER — Telehealth: Payer: Self-pay | Admitting: Nurse Practitioner

## 2015-01-17 NOTE — Telephone Encounter (Signed)
Patient left earlier message on clinic phone requesting reschedule CT scan from 01/26/15 due to needing to be out of town that day. Able to move scan to 01/19/15 at 12:30. She is informed of new date/time and NPO after 8:30 am that day. Patient to drink contrast bottles 1 at 10:30 and 1 at 11:30. She verbalizes understanding and is appreciative of working with her.   In basket sent to [employer] for precert 08/14/86 '@1130'$ 

## 2015-01-19 ENCOUNTER — Ambulatory Visit (HOSPITAL_COMMUNITY)
Admission: RE | Admit: 2015-01-19 | Discharge: 2015-01-19 | Disposition: A | Discharge status: Home / Self Care | Payer: Medicare Other | Source: Ambulatory Visit | Attending: Gynecologic Oncology | Admitting: Gynecologic Oncology

## 2015-01-19 ENCOUNTER — Encounter (HOSPITAL_COMMUNITY): Payer: Self-pay

## 2015-01-19 DIAGNOSIS — C7951 Secondary malignant neoplasm of bone: Secondary | ICD-10-CM | POA: Diagnosis not present

## 2015-01-19 DIAGNOSIS — C541 Malignant neoplasm of endometrium: Secondary | ICD-10-CM | POA: Insufficient documentation

## 2015-01-19 DIAGNOSIS — K8020 Calculus of gallbladder without cholecystitis without obstruction: Secondary | ICD-10-CM | POA: Diagnosis not present

## 2015-01-19 LAB — POCT I-STAT CREATININE: Creatinine, Ser: 0.8 mg/dL (ref 0.44–1.00)

## 2015-01-19 MED ORDER — IOHEXOL 300 MG/ML  SOLN
100.0000 mL | Freq: Once | INTRAMUSCULAR | Status: AC | PRN
  Administered 2015-01-19: 80 mL via INTRAVENOUS

## 2015-01-20 ENCOUNTER — Telehealth: Payer: Self-pay | Admitting: Gynecologic Oncology

## 2015-01-20 NOTE — Telephone Encounter (Signed)
Patient returned call to the office.  Informed of CT scan results.  Results released in [place].  Pt stating she had pneumonia 20 years ago and wonders if the pulmonary nodule is from that.  Advised to keep appt and call for any questions or concerns.

## 2015-01-25 ENCOUNTER — Other Ambulatory Visit: Payer: Self-pay

## 2015-01-25 DIAGNOSIS — C7951 Secondary malignant neoplasm of bone: Secondary | ICD-10-CM

## 2015-01-25 DIAGNOSIS — C541 Malignant neoplasm of endometrium: Secondary | ICD-10-CM

## 2015-01-25 MED ORDER — NAPROXEN 500 MG PO TABS: 500.0000 mg | ORAL_TABLET | Freq: Every day | ORAL | Status: DC

## 2015-01-26 ENCOUNTER — Ambulatory Visit (HOSPITAL_COMMUNITY): Payer: Medicare Other

## 2015-01-29 ENCOUNTER — Other Ambulatory Visit: Payer: Self-pay | Admitting: Oncology

## 2015-01-30 ENCOUNTER — Other Ambulatory Visit: Payer: Medicare Other

## 2015-01-30 ENCOUNTER — Ambulatory Visit: Payer: Medicare Other | Admitting: Oncology

## 2015-02-06 ENCOUNTER — Encounter: Payer: Self-pay | Admitting: Oncology

## 2015-02-06 ENCOUNTER — Ambulatory Visit (HOSPITAL_BASED_OUTPATIENT_CLINIC_OR_DEPARTMENT_OTHER): Payer: Medicare Other | Admitting: Oncology

## 2015-02-06 ENCOUNTER — Other Ambulatory Visit (HOSPITAL_BASED_OUTPATIENT_CLINIC_OR_DEPARTMENT_OTHER): Payer: Medicare Other

## 2015-02-06 VITALS — BP 147/70 | HR 72 | Temp 98.3°F | Resp 20 | Ht 59.0 in | Wt 110.0 lb

## 2015-02-06 DIAGNOSIS — C7951 Secondary malignant neoplasm of bone: Secondary | ICD-10-CM

## 2015-02-06 DIAGNOSIS — Z853 Personal history of malignant neoplasm of breast: Secondary | ICD-10-CM

## 2015-02-06 DIAGNOSIS — C541 Malignant neoplasm of endometrium: Secondary | ICD-10-CM

## 2015-02-06 LAB — COMPREHENSIVE METABOLIC PANEL (CC13)
ALT: 15 U/L (ref 0–55)
AST: 25 U/L (ref 5–34)
Albumin: 3.5 g/dL (ref 3.5–5.0)
Alkaline Phosphatase: 42 U/L (ref 40–150)
Anion Gap: 8 mEq/L (ref 3–11)
BUN: 24.5 mg/dL (ref 7.0–26.0)
CO2: 26 mEq/L (ref 22–29)
Calcium: 9.2 mg/dL (ref 8.4–10.4)
Chloride: 108 mEq/L (ref 98–109)
Creatinine: 0.8 mg/dL (ref 0.6–1.1)
EGFR: 76 mL/min/{1.73_m2} — ABNORMAL LOW (ref >90)
Glucose: 78 mg/dl (ref 70–140)
Potassium: 4.3 mEq/L (ref 3.5–5.1)
Sodium: 142 mEq/L (ref 136–145)
Total Bilirubin: 0.51 mg/dL (ref 0.20–1.20)
Total Protein: 6.3 g/dL — ABNORMAL LOW (ref 6.4–8.3)

## 2015-02-06 LAB — CBC WITH DIFFERENTIAL/PLATELET
BASO%: 0.8 % (ref 0.0–2.0)
Basophils Absolute: 0 10*3/uL (ref 0.0–0.1)
EOS%: 9.1 % — ABNORMAL HIGH (ref 0.0–7.0)
Eosinophils Absolute: 0.5 10*3/uL (ref 0.0–0.5)
HCT: 35.5 % (ref 34.8–46.6)
HGB: 11.8 g/dL (ref 11.6–15.9)
LYMPH%: 20.5 % (ref 14.0–49.7)
MCH: 32.7 pg (ref 25.1–34.0)
MCHC: 33.2 g/dL (ref 31.5–36.0)
MCV: 98.3 fL (ref 79.5–101.0)
MONO#: 0.6 10*3/uL (ref 0.1–0.9)
MONO%: 11.4 % (ref 0.0–14.0)
NEUT#: 3.1 10*3/uL (ref 1.5–6.5)
NEUT%: 58.2 % (ref 38.4–76.8)
Platelets: 174 10*3/uL (ref 145–400)
RBC: 3.61 10*6/uL — ABNORMAL LOW (ref 3.70–5.45)
RDW: 13.8 % (ref 11.2–14.5)
WBC: 5.3 10*3/uL (ref 3.9–10.3)
lymph#: 1.1 10*3/uL (ref 0.9–3.3)

## 2015-02-06 NOTE — Progress Notes (Signed)
OFFICE PROGRESS NOTE   February 06, 2015   Physicians:P.Gehrig, J.Kinard, (T.Brackbill) J.Russo, S.Ganem  INTERVAL HISTORY:   Patient is seen, alone for visit, in scheduled follow up of metastatic endometrial carcinoma involving L3-4, on observation since radiation completed 08-12-14, with zometa x 4 thru 10-2014. She had CT CAP 01-19-15 which shows decrease in left paraspinous soft tissue area compared with 09-2014, otherwise stable.  She has history of early stage right breast cancer not known recurrent, was treated with tamoxifen. Last bilateral tomo mammograms were at [facility] 10-18-14, with extremely dense tissue otherwise no mammographic findings of concern.   Patient is feeling very well. Back discomfort seems to be back to chronic baseline, with baseline scoliosis. She uses naprosyn a few times weekly for back, as previously.  She is working out regularly at gym, treadmill 2 miles in 30 min + bike; weights for upper body seemed to aggravate upper back and neck, resolved when she stopped that. Energy and appetite are good. She has had no symptoms of infection, no persistent abdominal or pelvic discomfort tho "intermittent IBS symptoms for several years" unchanged. No bladder symptoms. No LE swelling. No bleeding.   ONCOLOGIC HISTORY   Malignant neoplasm of corpus uteri, except isthmus   12/22/2007 Surgery Staging for IA UPSC    - 05/28/2008 Chemotherapy 6 cycles of paclitaxel and carboplatin with vaginal cuff brachytherapy   03/22/2011 Relapse/Recurrence Left pelvic node recurrence   04/25/2011 Surgery L/S evaluation, not resectable.   05/16/2011 - 09/25/2011 Chemotherapy 6 cyclces paclitaxel and carboplatin    - 12/27/2011 Radiation Therapy radiation completed   03/10/2013 Progression progression left pelvic mass   04/02/2013 Surgery Resection and IORT   06/13/2014 Relapse/Recurrence soft tissue mass at L3-4   07/11/2014 - 08/12/2014 Radiation Therapy IMRT + zometa    Endometrial cancer    12/01/2007 Initial Diagnosis Endometrial cancer   12/15/2007 Surgery    01/22/2008 -  Chemotherapy 6 cycles paclitaxol and carboplatin    Radiation Therapy HDR   03/31/2011 Relapse/Recurrence chemotherapy and external beam radiation   03/10/2013 Progression    04/02/2013 Surgery pelvic mass resection with IORT    Metastatic cancer to bone   06/27/2014 Initial Diagnosis Metastatic cancer to bone   BREAST CANCER: microinvasive T1N0 right breast cancer which was ER/PR positive and HER-2 2+ with insufficient material for FISH at diagnosis in 03/1999, treated with lumpectomy with 2 sentinel node evaluation, local radiation and 5 years of Tamoxifen thru 05/2004, then aromatase inhibitor 07/2004 thru 04/2006.   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 She had 6 cycles of taxol/carboplatin through Jan 2010, and vaginal cuff brachytherapy. She did well until some vague LUQ symptoms in Nov. 2012, with CT AP in [facility] system Mar 22, 2011 showing left pelvic sidewall involvement. She had laparoscopic evaluation at [facility] by [place] 04-25-2011, with finding of dense fibrosis in the area as well as mass encasing obturator nerve and vein as well as internal iliac vasculature; left ureterolysis was performed. No pathology was submitted from that procedure. She received 3 additional cycles of taxol/ carboplatin from 05-16-2011 thru 06-28-2011. CT AP on 07-17-2011 showed improvement in the left pelvic sidewall mass now 1.9 x 2 cm and 3.4 cm cephalocaudad, as compared with 2.5 x 2.2 cm and 5.4 cm on CT 03-22-11, and no progressive disease elsewhere. She saw Dr.Gehrig after the CT, with recommendation for an additonal 3 cycles  of same chemotherapy then repeat scan. Cycle 4 was taxol + carbo and neulasta, cycle 5 taxol only due to counts and cycle 6 taxol + carboplatin on 09-17-11. Follow up CT in  May 2013 showed enlargement of the left pelvic sidewall involvement. She had IMRT 50 GY in 28 fractions by Dr Sondra Come, that completed 12-05-11. She had regular follow up scans, Including CT AP 01-2014 and PET 02-2013, which did not show progressive disease. She has scoliosis and some chronic intermittent low back discomfort, but developed different, persistent discomfort low back and radiating to LLE by ~ Dec into Jan 2016. MRI lumbar spine at [facility] on 06-13-2014 showed known scoliosis and degenerative disc disease, with new finding of 2.7 x 2.4 x 1.9 cm soft tissue mass at L3-4 involving left neural foramen, with focal cortical destruction of adjacent L3 vertebral body and portion of left L4 pedicle. She had CT biopsy on 06-21-14, with pathology ([id number]) poorly differentiated carcinoma consistent with high grade serous. Bone scan 06-22-14 had uptake posteriorly in upper lumbar spine and a subtle increased area of uptake right greater trochanter of unclear significance. IMRT by Dr Sondra Come 2-29 thru 08-12-14. First zometa 07-07-14    Review of systems as above, also: No fever or symptoms of infection. No noted changes in breasts Remainder of 10 point Review of Systems negative.  Objective:  Vital signs in last 24 hours:  BP 147/70 mmHg  Pulse 72  Temp(Src) 98.3 F (36.8 C) (Oral)  Resp 20  Ht _0  (1.499 m)  Wt 110 lb (49.896 kg)  BMI 22.21 kg/m2  SpO2 99% Weight up 3 lbs Alert, oriented and appropriate, looks comfortable, very pleasant as always. Ambulatory without difficulty, able to get on and off exam table and change position easily. No alopecia  HEENT:PERRL, sclerae not icteric. Oral mucosa moist without lesions, posterior pharynx clear.  Neck supple. No JVD.  Lymphatics:no cervical,supraclavicular, axillary or inguinal adenopathy Resp: clear to auscultation bilaterally and normal percussion bilaterally Cardio: regular rate and rhythm. No gallop. GI: soft, nontender,  not distended, no mass or organomegaly. Normally active bowel sounds. Surgical incisions not remarkable. Musculoskeletal/ Extremities: UE, LE without pitting edema, cords, tenderness. Obvious scoliosis, back not tender Neuro: no peripheral neuropathy. Otherwise nonfocal. PSYCH appropriate mood and affect Skin without rash, ecchymosis, petechiae Breasts: Left without dominant mass, skin or nipple findings; right with lumpectomy scar not remarkable, no dominant mass or other findings of concern. Axillae benign.  Lab Results:  Results for orders placed or performed in visit on 02/06/15  CBC with Differential  Result Value Ref Range   WBC 5.3 3.9 - 10.3 10e3/uL   NEUT# 3.1 1.5 - 6.5 10e3/uL   HGB 11.8 11.6 - 15.9 g/dL   HCT 35.5 34.8 - 46.6 %   Platelets 174 145 - 400 10e3/uL   MCV 98.3 79.5 - 101.0 fL   MCH 32.7 25.1 - 34.0 pg   MCHC 33.2 31.5 - 36.0 g/dL   RBC 3.61 (L) 3.70 - 5.45 10e6/uL   RDW 13.8 11.2 - 14.5 %   lymph# 1.1 0.9 - 3.3 10e3/uL   MONO# 0.6 0.1 - 0.9 10e3/uL   Eosinophils Absolute 0.5 0.0 - 0.5 10e3/uL   Basophils Absolute 0.0 0.0 - 0.1 10e3/uL   NEUT% 58.2 38.4 - 76.8 %   LYMPH% 20.5 14.0 - 49.7 %   MONO% 11.4 0.0 - 14.0 %   EOS% 9.1 (H) 0.0 - 7.0 %   BASO% 0.8 0.0 - 2.0 %  Comprehensive metabolic panel (Cmet) - [facility]  Result Value Ref Range   Sodium 142 136 - 145 mEq/L   Potassium 4.3 3.5 - 5.1 mEq/L   Chloride 108 98 - 109 mEq/L   CO2 26 22 - 29 mEq/L   Glucose 78 70 - 140 mg/dl   BUN 24.5 7.0 - 26.0 mg/dL   Creatinine 0.8 0.6 - 1.1 mg/dL   Total Bilirubin 0.51 0.20 - 1.20 mg/dL   Alkaline Phosphatase 42 40 - 150 U/L   AST 25 5 - 34 U/L   ALT 15 0 - 55 U/L   Total Protein 6.3 (L) 6.4 - 8.3 g/dL   Albumin 3.5 3.5 - 5.0 g/dL   Calcium 9.2 8.4 - 10.4 mg/dL   Anion Gap 8 3 - 11 mEq/L   EGFR 76 (L) >90 ml/min/1.73 m2     Studies/Results: EXAM: CT CHEST, ABDOMEN, AND PELVIS WITH CONTRAST  COMPARISON: 10/07/2014  FINDINGS: CT CHEST  FINDINGS  Mediastinum: The heart size appears normal. No pericardial effusion. Aortic atherosclerosis noted. No mediastinal or hilar adenopathy identified. The trachea is patent and appears midline. Normal appearance of the esophagus. No pleural effusion scratched  Lungs/Pleura: No pleural effusion. There is a sub solid nodule within the superior segment of right lower lobe measuring 1.3 cm, image 28/series 5. This is unchanged from the previous exam. No new pulmonary nodules identified.  Musculoskeletal: There is no aggressive lytic or sclerotic bone lesion.  CT ABDOMEN AND PELVIS FINDINGS  Hepatobiliary: There is no suspicious liver abnormality. Multiple stones within the gallbladder. No gallbladder wall thickening. There is no biliary dilatation identified.  Pancreas: Unremarkable appearance of the pancreas.  Spleen: The spleen is normal.  Adrenals/Urinary Tract: The adrenal glands both appear normal. Unremarkable appearance of the kidneys. The urinary bladder appears within normal limits.  Stomach/Bowel: The stomach and the small bowel loops have a normal course and caliber. No obstruction. Unremarkable appearance of the colon.  Vascular/Lymphatic: Calcified atherosclerotic disease involves the abdominal aorta. No aneurysm. No enlarged retroperitoneal or mesenteric adenopathy. No enlarged pelvic or inguinal lymph nodes.  Reproductive: Previous hysterectomy. No adnexal mass.  Other: No free fluid or fluid collections.  Musculoskeletal: There is no aggressive lytic or sclerotic bone lesion. There is scoliosis deformity involving the lower thoracic and lumbar spine. Associated multi level degenerative disc disease identified. The left paraspinous lesion is again noted. Today this measures 11 mm, image 70/ series 2. Previously 1.7 cm. There are no new bone lesions identified.  IMPRESSION: 1. Decrease in size of left paraspinous metastasis. 2. No new  sites of disease. 3. Stable ground-glass attenuating nodule within the superior segment of right lower lobe. 4. Gallstones.   PACs images reviewed with patient at time of visit.   Medications: I have reviewed the patient's current medications. 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  DISCUSSION: Interval history reviewed with patient as above. CT information and images reviewed with her now. Clinically stable to continue close observation, is very reliable and knows to call if any concerns prior to seeing Dr Sondra Come in October and Dr Alycia Rossetti in Nov. I will be glad to see her back at whatever interval is needed depending on radiation oncology and gyn oncology plans; ok to see prn at this office if those physicians prefer. She will likely need interval reimaging, and PET may be helpful in addition to CT if questions. She needs tomo mammograms in 10-2015.  Assessment/Plan:  1.Papillary serous endometrial carcinoma IB at  diagnosis Aug 2009, recurrent at left pelvic sidewall 03-2011 and recent progression in area of left lumbar spine: Pain has resolved with radiation. Zometa given x4 thru 10-2014, now held. CT 01-26-15 shows improvement in lumbar paraspinous area treated with radiation and otherwise stable; other appointments with Drs Alycia Rossetti and Sondra Come as noted. I am glad to alternate visits with Drs Sondra Come and Alycia Rossetti or to see her back prn.  2.T1N0 right breast cancer 03-1999: adjuvant therapy included 5 years of tamoxifen. Not recurrent. Up to date on mammograms as of 10-18-14, tomo medically appropriate due to density of breast tissue 3.scoliosis of lumbar spine with degenerative disc disease and chronic symptoms related to this. 4.HTN followed by Dr Virgina Jock 5.for flu vaccine later this week.   All questions answered and patient is in agreement with recommendations and plans. Time spent 25 min including >50% counseling and coordination of care. Cc Drs Frederich Chick, MD   02/06/2015, 8:34 PM

## 2015-02-07 ENCOUNTER — Other Ambulatory Visit: Payer: Self-pay | Admitting: Oncology

## 2015-02-07 DIAGNOSIS — Z853 Personal history of malignant neoplasm of breast: Secondary | ICD-10-CM | POA: Insufficient documentation

## 2015-02-23 ENCOUNTER — Encounter: Payer: Self-pay | Admitting: Radiation Oncology

## 2015-02-23 ENCOUNTER — Ambulatory Visit
Admission: RE | Admit: 2015-02-23 | Discharge: 2015-02-23 | Disposition: A | Discharge status: Home / Self Care | Payer: Medicare Other | Source: Ambulatory Visit | Attending: Radiation Oncology | Admitting: Radiation Oncology

## 2015-02-23 VITALS — BP 156/74 | HR 75 | Temp 98.3°F | Resp 16 | Ht 59.0 in | Wt 110.5 lb

## 2015-02-23 DIAGNOSIS — C775 Secondary and unspecified malignant neoplasm of intrapelvic lymph nodes: Secondary | ICD-10-CM | POA: Diagnosis not present

## 2015-02-23 NOTE — Progress Notes (Signed)
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 (For IB papillary serous carcinoma of the uterus. Patient underwent surgery followed by intracavitary brachytherapy                                                                  treatments. Patient also received adjuvant chemotherapy at that time.)            Narrative:  The patient returns today for routine follow-up. She reports the pain in her lower back is much better. She still has occasional lower back pain from degenerative discs. She denies any bladder issues, vaginal/rectal bleeding, fatigue, or skin irritation. . Most recent bilateral mammogram was on 10/18/14 and most recent CT scan was on 01/19/15. Mammogram was negative and the CT shows no new sites of disease and a decrease in size of the left paraspinous metastasis. She saw Dr. Marko Plume on 02/06/15.  ALLERGIES:  is allergic to ace inhibitors; codeine; demerol; and dilaudid.  Meds: Current Outpatient Prescriptions  Medication Sig Dispense Refill  . ALPRAZolam (XANAX) 0.25 MG tablet Take 0.25 mg by mouth 2 (two) times daily as needed for anxiety.     Marland Kitchen amLODipine (NORVASC) 5 MG tablet Take 5 mg by mouth daily.    Marland Kitchen aspirin EC 81 MG tablet Take 81 mg by mouth daily.    Marland Kitchen atenolol (TENORMIN) 25 MG tablet Take 25 mg by mouth 2 (two) times daily.     . Biotin 5000 MCG TABS Take 5,000 mcg by mouth daily.    . cholecalciferol (VITAMIN D) 1000 UNITS tablet Take 1,000  Units by mouth every Monday, Wednesday, and Friday.    . Cyanocobalamin (VITAMIN B-12) 5000 MCG SUBL Place 1 tablet under the tongue daily.     Marland Kitchen ibuprofen (ADVIL,MOTRIN) 800 MG tablet     . Magnesium 250 MG TABS Take 250 mg by mouth every Monday, Wednesday, and Friday.     . Multiple Vitamin (MULTIVITAMIN WITH MINERALS) TABS tablet Take 1 tablet by mouth daily.    . naproxen (NAPROSYN) 500 MG tablet Take 1 tablet (500 mg total) by mouth daily. 30 tablet 2  . Omega-3 Fatty Acids (FISH OIL PO) Take 360 mg by mouth daily.    . potassium gluconate 595 MG TABS tablet Take 595 mg by mouth every Monday, Wednesday, and Friday.    . simvastatin (ZOCOR) 20 MG tablet Take 20 mg by mouth daily.     No current facility-administered medications for this encounter.    Physical Findings: The patient is in no acute distress. Patient is alert and oriented.  height is '4\' 11"'$  (1.499 m) and weight is 110 lb 8 oz (  50.122 kg). Her oral temperature is 98.3 F (36.8 C). Her blood pressure is 156/74 and her pulse is 75. Her respiration is 16.  Lungs are clear to auscultation bilaterally. Heart has regular rate and rhythm. No palpable cervical, supraclavicular, or axillary adenopathy. Abdomen is soft, nontender, nondistended, with no rigidity or guarding. Pelvic exam was not performed.  Lab Findings: Lab Results  Component Value Date   WBC 5.3 02/06/2015   HGB 11.8 02/06/2015   HCT 35.5 02/06/2015   MCV 98.3 02/06/2015   PLT 174 02/06/2015     Impression:  The patient is recovering from the effects of radiation. 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 Pain improved with radiation therapy as above  Plan:  She will see Dr. Marko Plume on a PRN basis. She is scheduled to see Dr. Alycia Rossetti in late November and a pelvic exam would be performed then. She will follow up with me on a PRN basis. If she has any questions or concerns she is welcome to call  me.  This document serves as a record of services personally performed by Gery Pray, MD. It was created on his behalf by Darcus Austin, a trained medical scribe. The creation of this record is based on the scribe's personal observations and the provider's statements to them. This document has been checked and approved by the attending provider.

## 2015-02-23 NOTE — Progress Notes (Signed)
[name] here for follow up.  She reports the pain in her lower back is much better.  She still occasionally has pain in her lower back from degenerative discs.  She denies having any bladder issues.  She reports having fluctuance on and off for many years.  She denies having vaginal/rectal bleeding.  She reports having a good appetite and denies having fatigue or skin irritation.  BP 156/74 mmHg  Pulse 75  Temp(Src) 98.3 F (36.8 C) (Oral)  Resp 16  Ht '4\' 11"'$  (1.499 m)  Wt 110 lb 8 oz (50.122 kg)  BMI 22.31 kg/m2

## 2015-04-12 ENCOUNTER — Other Ambulatory Visit (HOSPITAL_BASED_OUTPATIENT_CLINIC_OR_DEPARTMENT_OTHER): Payer: Medicare Other

## 2015-04-12 ENCOUNTER — Encounter: Payer: Self-pay | Admitting: Gynecologic Oncology

## 2015-04-12 ENCOUNTER — Ambulatory Visit: Payer: Medicare Other | Attending: Gynecologic Oncology | Admitting: Gynecologic Oncology

## 2015-04-12 VITALS — BP 153/68 | HR 72 | Temp 98.2°F | Resp 18 | Ht 59.0 in | Wt 110.0 lb

## 2015-04-12 DIAGNOSIS — Z8542 Personal history of malignant neoplasm of other parts of uterus: Secondary | ICD-10-CM

## 2015-04-12 DIAGNOSIS — C541 Malignant neoplasm of endometrium: Secondary | ICD-10-CM

## 2015-04-12 DIAGNOSIS — C7951 Secondary malignant neoplasm of bone: Secondary | ICD-10-CM | POA: Diagnosis not present

## 2015-04-12 LAB — COMPREHENSIVE METABOLIC PANEL (CC13)
ALT: 28 U/L (ref 0–55)
AST: 39 U/L — ABNORMAL HIGH (ref 5–34)
Albumin: 3.8 g/dL (ref 3.5–5.0)
Alkaline Phosphatase: 46 U/L (ref 40–150)
Anion Gap: 9 mEq/L (ref 3–11)
BUN: 24 mg/dL (ref 7.0–26.0)
CO2: 28 mEq/L (ref 22–29)
Calcium: 9.9 mg/dL (ref 8.4–10.4)
Chloride: 106 mEq/L (ref 98–109)
Creatinine: 0.8 mg/dL (ref 0.6–1.1)
EGFR: 70 mL/min/{1.73_m2} — ABNORMAL LOW (ref >90)
Glucose: 92 mg/dl (ref 70–140)
Potassium: 4.1 mEq/L (ref 3.5–5.1)
Sodium: 143 mEq/L (ref 136–145)
Total Bilirubin: 0.7 mg/dL (ref 0.20–1.20)
Total Protein: 7.1 g/dL (ref 6.4–8.3)

## 2015-04-12 LAB — CBC WITH DIFFERENTIAL/PLATELET
BASO%: 1 % (ref 0.0–2.0)
Basophils Absolute: 0.1 10*3/uL (ref 0.0–0.1)
EOS%: 4 % (ref 0.0–7.0)
Eosinophils Absolute: 0.2 10*3/uL (ref 0.0–0.5)
HCT: 37.6 % (ref 34.8–46.6)
HGB: 12.4 g/dL (ref 11.6–15.9)
LYMPH%: 18.7 % (ref 14.0–49.7)
MCH: 32.4 pg (ref 25.1–34.0)
MCHC: 33 g/dL (ref 31.5–36.0)
MCV: 98.1 fL (ref 79.5–101.0)
MONO#: 0.7 10*3/uL (ref 0.1–0.9)
MONO%: 14.1 % — ABNORMAL HIGH (ref 0.0–14.0)
NEUT#: 3.3 10*3/uL (ref 1.5–6.5)
NEUT%: 62.2 % (ref 38.4–76.8)
Platelets: 183 10*3/uL (ref 145–400)
RBC: 3.83 10*6/uL (ref 3.70–5.45)
RDW: 13.8 % (ref 11.2–14.5)
WBC: 5.3 10*3/uL (ref 3.9–10.3)
lymph#: 1 10*3/uL (ref 0.9–3.3)

## 2015-04-12 NOTE — Progress Notes (Signed)
Follow Up Note: Gyn-Onc  [name] 74 y.o. female  CC:  Chief Complaint  Patient presents with  . endometrial cancer    F/U    HPI:  [name] is seen for continuing attention to her recurrent endometrial carcinoma, post cycle 6 chemotherapy on 5/13. Cycle 5 was delayed a week due to counts, then taxol only given on 4-16 due to platelets of only 90K that day; she did have neulasta on 08-30-2011 (then enjoyed 2 weeks at beach). She then had radiation to persistent disease on the left pelvic side wall in 8/13.  History includes microinvasive T1N0 right breast cancer, which was ER/PR positive and HER-2 2+ with insufficient material for FISH at diagnosis in 03/1999, treated with lumpectomy with 2 sentinel node evaluation, local radiation and 5 years of Tamoxifen thru 05/2004, then aromatase inhibitor 07/2004 thru 04/2006.  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 She had 6 cycles of taxol/carboplatin through Jan 2010, and vaginal cuff brachytherapy. She did well until some vague LUQ symptoms in Nov. 2012, with CT AP in [facility] Mar 22, 2011 showing left pelvic sidewall involvement. She was taken to laparoscopic evaluation 04-25-2011, with findings of dense fibrosis in the area as well as mass encasing obturator nerve and vein as well as internal iliac vasculature; left ureterolysis was performed. No pathology was submitted from that procedure. We proceeded with initial 3 additional cycles of taxol/ carboplatin (with carbo skin tests) from 05-16-2011 thru 06-28-2011, then repeated CT AP on 07-17-2011. The CT showed improvement in the left pelvic sidewall mass now 1.9 x 2 cm and 3.4 cm cephalocaudad, as compared with 2.5 x 2.2 cm and 5.4 cm on CT 03-22-11, and no progressive disease elsewhere. She saw me after the CT, with recommendation for an additonal 3 cycles of  same chemotherapy then repeat scan. Cycle 4 was taxol + [place], with carbo skin test and neulasta, then cycle 5 as above only taxol.  She then completed radiation therapy under the care of Dr. Sondra Come which she completed in August of 2013. She had a CT scan on March 23, 2012 that revealed:   IMPRESSION:  1. Today's study demonstrates a positive response to therapy with decreased size of left pelvic sidewall nodal masses, as detailed above. No new soft tissue masses or new lymphadenopathy is identified within the abdomen or pelvis. Continue attention on follow-up studies is recommended.  2. Cholelithiasis without findings to suggest acute cholecystitis.  3. Colonic diverticulosis without findings to suggest acute diverticulitis at this time.  4. Mild asymmetric urinary bladder wall thickening is unchanged compared to prior examinations and is nonspecific. No definite bladder wall mass is identified at this time.  5. Additional findings, similar to prior examinations, as above.   There had been another lymph node identified on her CT scan in may that measured 2.1 x 1.7 cm. In reviewing her CT report it states that the nodal mass higher up along the pelvic sidewalls is no longer clearly identified although there continues to be some amorphus soft tissue in the region with the previously noted nodal mass resided. This was confirmed with the reading radiologist and communicated to the patient.   CT 06/22/12:  Normal urinary bladder. Hysterectomy. Decreased size of the left pelvic peripherally enhancing lesion. This measures 1.8 x 1.7 cm  on image 55/series 2 versus 2.4 x 2.1 cm  on the prior exam. No new lesions are identified. No significant free fluid.  Bones/Musculoskeletal: No acute osseous abnormality. Trace L4-L5 anterolisthesis is likely degenerative. There is S-shaped lumbar  spine curvature.  IMPRESSION:  1. Further decreased size of the left pelvic peripherally enhancing lesion.  2. No new  sites of new or progressive disease.  3. Esophageal air fluid level suggests dysmotility or gastroesophageal reflux.  4. Cholelithiasis.   CT 11/02/2012:  The index lesion within the left pelvic side wall measures 2.2 x 2.7 cm, image 55/series 2. Previously this measured 1.8 x 1.7 cm. No new lesions identified. The stomach and small bowel loops appear within normal limits. Normal appearance of the proximal colon. Multiple sigmoid diverticula identified without acute inflammation. Review of the visualized bony structures shows scoliosis and multilevel degenerative disc disease within the lumbar spine.  IMPRESSION:  1. The left pelvic side wall lesion demonstrates mild increase in size from previous exam.  2. No new sites of new or progressive disease.  3. Cholelithiasis.   CT 01/28/13: FINDINGS:  Lung bases are clear. No focal hepatic lesion. The gallbladder, pancreas, spleen, adrenal glands, and kidneys are normal. There are several gallstones within the lumen of the gallbladder. The stomach, small bowel, and cecum are normal. The diverticulum sigmoid colon. Rectum is normal. Abdominal aorta is normal in caliber. No retroperitoneal or periportal lymphadenopathy. No evidence of peritoneal disease. Within the pelvis, the left pelvic sidewall lobular enhancing mass which is slightly increased in size. The lesion is similar in axial dimension measuring 30 x 21 mm compared to the 29 x 22 mm on prior remeasured. In the craniocaudad dimension, lesion measures 26 mm 1ncreased from 21 mm on prior. No additional pelvic lymphadenopathy. Hysterectomy anatomy. Vaginal cuff appears normal. Bladder is normal.  Review of bone windows demonstrates no aggressive osseous lesions.  IMPRESSION:  1. Interval small increase in volume of enhancing mass adjacent to the left pelvic sidewall.  2. No evidence of abdominal or pelvic lymphadenopathy. 74 year old with recurrent uterine papillary serous carcinoma initially stage  IB diagnosed in 2009. Most recent imaging in 01/2012 of this year reveals interval enlargement of her left pelvic sidewall mass consistent with nodal disease.   PET 03/10/13: 1. Hypermetabolic mass in the deep left pelvis consistent with metastasis.  2. No additional evidence of local metastasis. No evidence of distant metastasis.  3. Small right lower lobe pulmonary nodule is not hypermetabolic. Recommend attention on follow-up.  She underwent resection of pelvic mass, left sided ureterolysis, intra-operative radiation therapy by radiation oncology, omental J flap, and left ureteral stent placement by urology at [facility]  on 04/02/13.  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  Final pathology resulted:  Omentum, biopsy: Mature adipose tissue, fibrous tissue, calcifications and bland appearing gland consistent with endosalpingiosis.  No diagnostic carcinoma identified, pancytokeratin confirmatory (negative).  Lymph nodes, left obturator, regional resection: Aggregate of lymph node (4.1 cm) involved by poorly differentiated high grade carcinoma (see Light Microscopy).  Abundant tumor necrosis.   She had her cystoscopy and ureteral stent removal on 05/20/2013. She had a CT scan March 17 that revealed no evidence of recurrent disease. She comes in today for interval followup.  She had a CT scan performed on 02/05/14: IMPRESSION:  No CT findings for recurrent or metastatic disease involving the abdomen/  pelvis.  She had been having increasing issues over the past 3-4 weeks with increasing back pain. She has been told she has DJD and in the past this pain has been relieved with stress dose steroids, but this time it has not helped.   MRI lumbar spine at [facility] on 06-13-2014 showed known scoliosis and  degenerative disc disease, with new finding of 2.7 x 2.4 x 1.9 cm soft tissue mass at L3-4 involving left neural foramen, with focal cortical destruction of adjacent L3 vertebral body and portion of left L4 pedicle. She had CT biopsy on 06-21-14, with pathology ([id number]) poorly differentiated carcinoma consistent with high grade serous. Bone scan 06-22-14 had uptake posteriorly in upper lumbar spine and a subtle increased area of uptake right greater trochanter of unclear significance. IMRT by Dr Sondra Come 2-29 thru 08-12-14. First zometa 07-07-14-6/16.  Post-XRT CT scan revealed: IMPRESSION: 1. Since the biopsy study of 06/21/2014, similar to slight decrease in size of a left paraspinous lesion at L3-4. 2. No new sites of metastatic disease identified. 3. 8 mm ground-glass nodule in the right lower lobe is grossly similar to 03/10/2013, suggesting a benign etiology. 4. Cholelithiasis. 5. Apparent sigmoid colonic wall thickening which could be due to underdistention. Colitis felt less likely. 6. Atherosclerosis, including within the coronary arteries. 7. Pelvic floor laxity.  CT 10/07/14: Musculoskeletal: Stable to slight interval decrease in size of left paraspinous lesion at the L3-4 level with peripheral enhancement measuring 1.6 x 1.7 cm (image 72; series 2), previously 2.0 x 1.8 cm.  IMPRESSION: Stable to slight interval decrease in size of left paraspinous lesion at L3-4.  No new sites of metastatic disease.  Interval History:  I last saw her in May. She was seen by Dr. Marko Plume in September and Dr. Sondra Come in October. She did have imaging 01/19/15: The left paraspinous lesion is again noted. Today this measures 11 mm, image 70/ series 2. Previously 1.7 cm. There are no new bone lesions identified.  IMPRESSION: 1. Decrease in size of left paraspinous metastasis. 2. No new sites of disease. 3. Stable ground-glass attenuating nodule within the superior segment of right lower lobe. 4.  Gallstones.  She comes in today for follow-up. She believes that from a cognitive perspective and she is much better. She is going to the gym regularly. She does have some mild residual neuropathy in her feet but otherwise completely asymptomatic. She was having some numbness down towards her left knee. She was seen by orthopedics and an x-ray was done that did not reveal any arthritis. There are no new medical problems and her family.  Review of Systems  Constitutional: Feels well  Cardiovascular: No chest pain, shortness of breath, or edema.  Pulmonary: No cough or wheeze.  Gastrointestinal: No nausea, vomiting, or diarrhea. No bright red blood per rectum or change in bowel movement.  Genitourinary: No frequency, urgency, or dysuria. No vaginal bleeding or discharge.  Occasional leakage  Musculoskeletal: + back pain but not of the severity she experienced when she was diagnosed with recurrence. Neurologic: + numbness around left knee Psychology: No changes  Current Meds:  Outpatient Encounter Prescriptions as of 04/12/2015  Medication Sig  . amLODipine (NORVASC) 5 MG tablet Take 5 mg by mouth daily.  Marland Kitchen aspirin EC 81 MG tablet Take 81 mg by mouth daily.  Marland Kitchen atenolol (TENORMIN) 25 MG tablet Take 25 mg by mouth 2 (two) times daily.   . Biotin 5000 MCG TABS Take 5,000 mcg by mouth daily.  Marland Kitchen  cholecalciferol (VITAMIN D) 1000 UNITS tablet Take 1,000 Units by mouth every Monday, Wednesday, and Friday.  . Cyanocobalamin (VITAMIN B-12) 5000 MCG SUBL Place 1 tablet under the tongue daily.   Marland Kitchen ibuprofen (ADVIL,MOTRIN) 800 MG tablet   . Magnesium 250 MG TABS Take 250 mg by mouth every Monday, Wednesday, and Friday.   . Multiple Vitamin (MULTIVITAMIN WITH MINERALS) TABS tablet Take 1 tablet by mouth daily.  . naproxen (NAPROSYN) 500 MG tablet Take 1 tablet (500 mg total) by mouth daily.  . Omega-3 Fatty Acids (FISH OIL PO) Take 360 mg by mouth daily.  . potassium gluconate 595 MG TABS tablet Take 595  mg by mouth every Monday, Wednesday, and Friday.  . simvastatin (ZOCOR) 20 MG tablet Take 20 mg by mouth daily.  Marland Kitchen ALPRAZolam (XANAX) 0.25 MG tablet Take 0.25 mg by mouth 2 (two) times daily as needed for anxiety.    No facility-administered encounter medications on file as of 04/12/2015.    Allergy:  Allergies  Allergen Reactions  . Ace Inhibitors Anaphylaxis  . Codeine Nausea Only  . Demerol Nausea Only  . Dilaudid [Hydromorphone Hcl]     "total loss of her mind"    Social Hx:   Social History   Social History  . Marital Status: Married    Spouse Name: N/A  . Number of Children: N/A  . Years of Education: N/A   Occupational History  . Not on file.   Social History Main Topics  . Smoking status: Former Smoker    Types: Cigarettes    Quit date: 05/13/1962  . Smokeless tobacco: Never Used  . Alcohol Use: 3.0 oz/week    5 Glasses of wine per week  . Drug Use: No  . Sexual Activity: No   Other Topics Concern  . Not on file   Social History Narrative    Past Surgical Hx:  Past Surgical History  Procedure Laterality Date  . Total abdominal hysterectomy w/ bilateral salpingoophorectomy    . Breast lumpectomy    . Appendectomy    . Tonsillectomy    . Foot surgery      RIGHT    Past Medical Hx:  Past Medical History  Diagnosis Date  . Hypertension   . Palpitations   . History of breast cancer 2000    right breast -- treated with lumpectomy and radiation therarpy, postoperatively with tamoxifen   . Hyperlipidemia   . Hypercholesterolemia   . PVC's (premature ventricular contractions)   . S/P radiation therapy 10/28/11-12/05/11    5040 cGy left pelvis  . S/P radiation therapy     Intracavitary brachytherapy of uterus  . Radiation 07/11/14-08/12/14    left lumbar paraspinal area 55 gray  . Endometrial cancer ([place]) 12/2007    s/p total abdominal hysterectomy at [facility] - ovaries were also removed     Family Hx:  Family History  Problem Relation Age of  Onset  . Thrombosis Father     coronary thrombosis  . Hypertension Father   . Heart failure Mother   . Coronary artery disease Brother     Vitals:  Blood pressure 153/68, pulse 72, temperature 98.2 F (36.8 C), temperature source Oral, resp. rate 18, height _0  (1.499 m), weight 110 lb (49.896 kg), SpO2 100 %.  Physical Exam:  General: Well developed, well nourished female in no acute distress. Alert and oriented x 3.   Neck: Supple without any enlargements.   Lymph node survey: No cervical, supraclavicular, or  inguinal adenopathy.   Cardiovascular: Regular rate and rhythm.  Lungs: Clear to auscultation bilaterally.   Skin: No rashes or lesions present.  Abdomen: Abdomen soft, non-tender and non-obese. Active bowel sounds in all quadrants. Well-healed vertical midline incision. No evidence of an incisional hernia.  Extremities: No bilateral cyanosis, edema, or clubbing. Left knee atraumatic, no swelling  Pelvic: External genitalia within normal limits. Speculum examination was an atrophic vagina. The vaginal cuff is visualized. There are no gross visible lesions. Bimanual examination reveals no masses or nodularity. Rectal confirms.  Assessment/Plan:  74 year old with recurrent uterine papillary serous carcinoma initially stage IB diagnosed in 2009.  She is s/p resection of pelvic mass, left sided ureterolysis, intra-operative radiation therapy by radiation oncology, omental J flap, and left ureteral stent placement by urology at [facility]  on 04/02/13.  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   She then unfortunately suffered a recurrence in the left paraspinal space and was treated with Zometa as well as IMRT. Imaging shows that the lesion is smaller on subsequent imaging.  She has been released  both by Dr. Sondra Come and Dr. Marko Plume. We will follow-up with a repeat CT in December. I will call her with those results. Pending that those results are unremarkable, she will return to see me in 3 months. I will of course let Dr. Marko Plume and Dr. Sondra Come know the results.   [name] A.,  04/12/2015, 10:53 AM

## 2015-05-02 ENCOUNTER — Ambulatory Visit (HOSPITAL_COMMUNITY)
Admission: RE | Admit: 2015-05-02 | Discharge: 2015-05-02 | Disposition: A | Discharge status: Home / Self Care | Payer: Medicare Other | Source: Ambulatory Visit | Attending: Gynecologic Oncology | Admitting: Gynecologic Oncology

## 2015-05-02 ENCOUNTER — Encounter (HOSPITAL_COMMUNITY): Payer: Self-pay

## 2015-05-02 DIAGNOSIS — Z08 Encounter for follow-up examination after completed treatment for malignant neoplasm: Secondary | ICD-10-CM | POA: Insufficient documentation

## 2015-05-02 DIAGNOSIS — M4186 Other forms of scoliosis, lumbar region: Secondary | ICD-10-CM | POA: Diagnosis not present

## 2015-05-02 DIAGNOSIS — K8080 Other cholelithiasis without obstruction: Secondary | ICD-10-CM | POA: Diagnosis not present

## 2015-05-02 DIAGNOSIS — K5730 Diverticulosis of large intestine without perforation or abscess without bleeding: Secondary | ICD-10-CM | POA: Insufficient documentation

## 2015-05-02 DIAGNOSIS — C541 Malignant neoplasm of endometrium: Secondary | ICD-10-CM | POA: Diagnosis not present

## 2015-05-02 MED ORDER — IOHEXOL 300 MG/ML  SOLN
100.0000 mL | Freq: Once | INTRAMUSCULAR | Status: AC | PRN
  Administered 2015-05-02: 80 mL via INTRAVENOUS

## 2015-05-04 ENCOUNTER — Telehealth: Payer: Self-pay

## 2015-05-04 NOTE — Telephone Encounter (Signed)
Patient's call returned to update that Joylene John, APNP updated Dr Nancy Marus of your request to be contcated with your test results . Dr Alycia Rossetti to conatct the patient sometime today , cxall back information provided if additional questions arise.

## 2015-05-05 ENCOUNTER — Other Ambulatory Visit: Payer: Self-pay | Admitting: Gynecologic Oncology

## 2015-05-05 DIAGNOSIS — C7951 Secondary malignant neoplasm of bone: Secondary | ICD-10-CM

## 2015-05-05 DIAGNOSIS — C549 Malignant neoplasm of corpus uteri, unspecified: Secondary | ICD-10-CM

## 2015-05-05 NOTE — Progress Notes (Signed)
Order for PET per Dr. Alycia Rossetti, Dr. Alycia Rossetti called the patient and spoke with her about CT results yesterday.

## 2015-05-09 ENCOUNTER — Telehealth: Payer: Self-pay

## 2015-05-09 NOTE — Telephone Encounter (Signed)
Attempted to contact the patient to update with PET Scan being moved from May 18, 2014 to Dec 29 , 2016 at 7:30 AM . No answer , left a detailed message on home number as cell number with date and time of changes. Instruction given to remain NPO after midnight and to arrive at 7 AM to register. Call back requested to ensure the patient is aware of the changes and instructions , call back number provided.

## 2015-05-11 ENCOUNTER — Encounter (HOSPITAL_COMMUNITY)
Admission: RE | Admit: 2015-05-11 | Discharge: 2015-05-11 | Disposition: A | Discharge status: Home / Self Care | Payer: Medicare Other | Source: Ambulatory Visit | Attending: Gynecologic Oncology | Admitting: Gynecologic Oncology

## 2015-05-11 DIAGNOSIS — C541 Malignant neoplasm of endometrium: Secondary | ICD-10-CM | POA: Diagnosis not present

## 2015-05-11 DIAGNOSIS — C549 Malignant neoplasm of corpus uteri, unspecified: Secondary | ICD-10-CM | POA: Insufficient documentation

## 2015-05-11 DIAGNOSIS — C412 Malignant neoplasm of vertebral column: Secondary | ICD-10-CM | POA: Diagnosis not present

## 2015-05-11 DIAGNOSIS — C7951 Secondary malignant neoplasm of bone: Secondary | ICD-10-CM | POA: Diagnosis not present

## 2015-05-11 LAB — GLUCOSE, CAPILLARY: Glucose-Capillary: 94 mg/dL (ref 65–99)

## 2015-05-11 MED ORDER — FLUDEOXYGLUCOSE F - 18 (FDG) INJECTION
5.3000 | Freq: Once | INTRAVENOUS | Status: AC | PRN
  Administered 2015-05-11: 5.3 via INTRAVENOUS

## 2015-05-16 ENCOUNTER — Telehealth: Payer: Self-pay | Admitting: Gynecologic Oncology

## 2015-05-16 NOTE — Telephone Encounter (Signed)
TC to patient. We discussed the PET scan results. Not sure if there is any additional room for radiation in this location. We also discussed the pulmonary lesion. First mention was on CT 4/16, but per radiology stable recently but growing over 7 years. Will touch base with thoracic surgery to see if they would like to see the patient. She is amenable to any options. Will discuss with Dr. Sondra Come and I will call her back. PG

## 2015-05-18 ENCOUNTER — Telehealth: Payer: Self-pay | Admitting: Gynecologic Oncology

## 2015-05-18 ENCOUNTER — Other Ambulatory Visit: Payer: Self-pay | Admitting: Gynecologic Oncology

## 2015-05-18 DIAGNOSIS — R911 Solitary pulmonary nodule: Secondary | ICD-10-CM

## 2015-05-18 NOTE — Telephone Encounter (Signed)
Left message for patient stating she would be receiving a phone call from the cardiothoracic office for an appt.  Advised to call for any questions or if she had not heard from the office.  Pt informed of the above.

## 2015-05-19 ENCOUNTER — Ambulatory Visit (HOSPITAL_COMMUNITY): Payer: Medicare Other

## 2015-05-23 ENCOUNTER — Institutional Professional Consult (permissible substitution) (INDEPENDENT_AMBULATORY_CARE_PROVIDER_SITE_OTHER): Payer: Medicare Other | Admitting: Thoracic Surgery (Cardiothoracic Vascular Surgery)

## 2015-05-23 ENCOUNTER — Encounter: Payer: Self-pay | Admitting: Thoracic Surgery (Cardiothoracic Vascular Surgery)

## 2015-05-23 VITALS — BP 175/79 | HR 70 | Resp 20 | Ht 59.0 in | Wt 108.0 lb

## 2015-05-23 DIAGNOSIS — R911 Solitary pulmonary nodule: Secondary | ICD-10-CM | POA: Diagnosis not present

## 2015-05-23 DIAGNOSIS — C541 Malignant neoplasm of endometrium: Secondary | ICD-10-CM

## 2015-05-23 DIAGNOSIS — Z853 Personal history of malignant neoplasm of breast: Secondary | ICD-10-CM

## 2015-05-23 NOTE — Progress Notes (Signed)
PCP is Precious Reel, MD Referring Provider is Dorothyann Gibbs, NP  Chief Complaint  Patient presents with  . Lung Lesion    Surgical eval, PET Scan 05/10/15, Chest CT 01/19/15    HPI:   Past Medical History  Diagnosis Date  . Hypertension   . Palpitations   . History of breast cancer 2000    right breast -- treated with lumpectomy and radiation therarpy, postoperatively with tamoxifen   . Hyperlipidemia   . Hypercholesterolemia   . PVC's (premature ventricular contractions)   . S/P radiation therapy 10/28/11-12/05/11    5040 cGy left pelvis  . S/P radiation therapy     Intracavitary brachytherapy of uterus  . Radiation 07/11/14-08/12/14    left lumbar paraspinal area 55 gray  . Endometrial cancer ([place]) 12/2007    s/p total abdominal hysterectomy at [facility] - ovaries were also removed     Past Surgical History  Procedure Laterality Date  . Total abdominal hysterectomy w/ bilateral salpingoophorectomy    . Breast lumpectomy    . Appendectomy    . Tonsillectomy    . Foot surgery      RIGHT    Family History  Problem Relation Age of Onset  . Thrombosis Father     coronary thrombosis  . Hypertension Father   . Heart failure Mother   . Coronary artery disease Brother     Social History Social History  Substance Use Topics  . Smoking status: Former Smoker    Types: Cigarettes    Quit date: 05/13/1962  . Smokeless tobacco: Never Used  . Alcohol Use: 3.0 oz/week    5 Glasses of wine per week    Current Outpatient Prescriptions  Medication Sig Dispense Refill  . ALPRAZolam (XANAX) 0.25 MG tablet Take 0.25 mg by mouth 2 (two) times daily as needed for anxiety.     Marland Kitchen amLODipine (NORVASC) 5 MG tablet Take 5 mg by mouth daily.    Marland Kitchen aspirin EC 81 MG tablet Take 81 mg by mouth daily.    Marland Kitchen atenolol (TENORMIN) 25 MG tablet Take 25 mg by mouth 2 (two) times daily.     . Biotin 5000 MCG TABS Take 5,000 mcg by mouth daily.    . cholecalciferol (VITAMIN D) 1000 UNITS tablet  Take 1,000 Units by mouth every Monday, Wednesday, and Friday.    . Cyanocobalamin (VITAMIN B-12) 5000 MCG SUBL Place 1 tablet under the tongue daily.     Marland Kitchen ibuprofen (ADVIL,MOTRIN) 800 MG tablet     . Magnesium 250 MG TABS Take 250 mg by mouth every Monday, Wednesday, and Friday.     . Multiple Vitamin (MULTIVITAMIN WITH MINERALS) TABS tablet Take 1 tablet by mouth daily.    . naproxen (NAPROSYN) 500 MG tablet Take 1 tablet (500 mg total) by mouth daily. 30 tablet 2  . Omega-3 Fatty Acids (FISH OIL PO) Take 360 mg by mouth daily.    . potassium gluconate 595 MG TABS tablet Take 595 mg by mouth every Monday, Wednesday, and Friday.    . simvastatin (ZOCOR) 20 MG tablet Take 20 mg by mouth daily.     No current facility-administered medications for this visit.    Allergies  Allergen Reactions  . Ace Inhibitors Anaphylaxis  . Codeine Nausea Only  . Demerol Nausea Only  . Dilaudid [Hydromorphone Hcl]     "total loss of her mind"    Review of Systems  Constitutional: Negative for activity change, appetite change and unexpected  weight change.  Respiratory: Negative for cough, shortness of breath and wheezing.   Cardiovascular: Negative for chest pain and leg swelling.  Gastrointestinal: Positive for diarrhea. Negative for abdominal pain.  Neurological: Positive for numbness (Neuropathy in feet).    BP 175/79 mmHg  Pulse 70  Resp 20  Ht '4\' 11"'$  (1.499 m)  Wt 108 lb (48.988 kg)  BMI 21.80 kg/m2  SpO2 98% Physical Exam  Constitutional: She is oriented to person, place, and time. She appears well-developed and well-nourished. No distress.  HENT:  Head: Normocephalic and atraumatic.  Eyes: Conjunctivae and EOM are normal. No scleral icterus.  Neck: Neck supple. No thyromegaly present.  Cardiovascular: Normal rate, regular rhythm, normal heart sounds and intact distal pulses.   No murmur heard. Pulmonary/Chest: Effort normal and breath sounds normal. She has no wheezes. She has no  rales.  Abdominal: Soft. There is no tenderness.  Musculoskeletal: She exhibits no edema.  Lymphadenopathy:    She has no cervical adenopathy.  Neurological: She is alert and oriented to person, place, and time. No cranial nerve deficit.  No focal motor deficit  Skin: Skin is warm and dry.  Vitals reviewed.    Diagnostic Tests: NUCLEAR MEDICINE PET SKULL BASE TO THIGH  TECHNIQUE: 5.3 mCi F-18 FDG was injected intravenously. Full-ring PET imaging was performed from the skull base to thigh after the radiotracer. CT data was obtained and used for attenuation correction and anatomic localization.  FASTING BLOOD GLUCOSE: Value: 94 mg/dl  COMPARISON: Multiple exams, including 05/02/2015 and 03/10/2013  FINDINGS: NECK  No hypermetabolic lymph nodes in the neck. Mild chronic bilateral maxillary sinusitis.  CHEST  No hypermetabolic mediastinal or hilar nodes. 13 mm sub solid nodule in the superior segment right lower lobe, not hypermetabolic, but slowly increasing in size of the last 7 years.  Coronary, aortic arch, and branch vessel atherosclerotic vascular disease. Right axillary clips noted.  ABDOMEN/PELVIS  No abnormal hypermetabolic activity within the liver, pancreas, adrenal glands, or spleen. No hypermetabolic lymph nodes in the abdomen or pelvis.  Cholelithiasis with multiple medium size gallstones observed. Aortoiliac atherosclerotic vascular disease. Sigmoid colon diverticulosis.  SKELETON/MUSCULOSKELETAL  The soft tissue mass just lateral to the left L3-4 neural foramen has a maximum standard uptake value of 7.5. Between the left L1 and left L2 transverse processes, there is a focus of hypermetabolic activity with maximum standard uptake value 3.6, without CT correlate.  Levoconvex lumbar scoliosis.  IMPRESSION: 1. The small soft tissue mass just lateral to the left L3 for neural foramen is hypermetabolic, favoring malignancy. 2.  There is also a small focus of hypermetabolic activity between the left L1 and L2 transverse processes, but without a CT correlate. 3. The 13 mm sub solid nodule in the superior segment right lower lobe is stable from recent exams but has slowly increased in size over the last 7 years. Although not hypermetabolic, the appearance is concerning for the possibility of a low grade adenocarcinoma. 4. Other imaging findings of potential clinical significance: Chronic bilateral maxillary sinusitis. Coronary, aortic arch, and branch vessel atherosclerotic vascular disease. Aortoiliac atherosclerotic vascular disease. Cholelithiasis. Sigmoid colon diverticulosis. Lumbar scoliosis.   Electronically Signed  By: Van Clines M.D.  On: 05/11/2015 09:51  I personally reviewed the PET/CT images and concur with the findings as noted above. I also reviewed all CT chest images dating back to 2009.  Impression: 75 year old woman with metastatic endometrial cancer who has a incidentally noted 13 mm groundglass opacity in the superior segment of the  right lower lobe. This does appear to have a very small soft tissue component. It is not hypermetabolic by PET.  Reviewing her scans there was a 4 mm groundglass opacity at that site back in 2009. It has definitely grown in the 7 years since that scan. More recently has been stable over the past year or so.  The lung nodule most likely represents a low-grade adenocarcinoma with lepidic spread. It could be infectious or inflammatory in etiology, but I think a low-grade adenocarcinoma is more likely. Given the minimal soft tissue component and absence of activity on PET/CT I think this nodule can be safely followed, particularly given her metastatic endometrial cancer.  I discussed several potential options with Mr. and [name]. One option would be surgical resection. I don't favor that in her case given that she has much more pressing health concerns.  Another option would be stereotactic radiation. We will ask Dr. Sondra Come to weigh in on that issue. The final option would be continued radiographic follow-up. Given that she has evidence of active metastatic endometrial cancer by recommendation would be radiographic follow-up for now.  I recommended that we reevaluate the area in 3-4 months. She likely will be getting scanned during that timeframe for her endometrial cancer anyway. We will arrange a follow-up after her scan.  Plan: Return in 3-4 months following CT that includes chest.  I spent 30 minutes face-to-face with Mrs. Danner during this visit, greater than 50% spent in counseling. Melrose Nakayama, MD [facility] [phone]

## 2015-05-24 ENCOUNTER — Encounter: Payer: Self-pay | Admitting: Gynecologic Oncology

## 2015-05-25 ENCOUNTER — Other Ambulatory Visit: Payer: Self-pay | Admitting: Gynecologic Oncology

## 2015-05-25 ENCOUNTER — Telehealth: Payer: Self-pay | Admitting: Gynecologic Oncology

## 2015-05-25 DIAGNOSIS — C541 Malignant neoplasm of endometrium: Secondary | ICD-10-CM

## 2015-05-25 NOTE — Telephone Encounter (Signed)
TC to patient.  I spoke with Dr. Sondra Come and unfortunately, the lesion is in the region of the radiation field and further radiation is not an option. We discussed proceeding with chemotherapy versus short term follow up as she is feeling well and wishes to maintain QoL. After our discussion, she wishes to have short term follow up and defer chemotherapy at this time. Will schedule a repeat scan per her request on 07/11/15. Nancy Marus

## 2015-05-26 ENCOUNTER — Telehealth: Payer: Self-pay | Admitting: *Deleted

## 2015-05-26 NOTE — Telephone Encounter (Signed)
Notified pt of scheduled appointments. Pt has an scheduled lab appointment on 07/10/2015 @ 11:30 and a CT scan on 07/11/2015 @ 10:00. Pt will pick up contrast and instructions for CT scan on 07/09/14 after her lab appointment. Pt agreed with appointment times and dates

## 2015-05-29 ENCOUNTER — Other Ambulatory Visit: Payer: Self-pay

## 2015-05-29 DIAGNOSIS — C541 Malignant neoplasm of endometrium: Secondary | ICD-10-CM

## 2015-07-07 ENCOUNTER — Other Ambulatory Visit: Payer: Medicare Other

## 2015-07-10 ENCOUNTER — Other Ambulatory Visit (HOSPITAL_BASED_OUTPATIENT_CLINIC_OR_DEPARTMENT_OTHER): Payer: Medicare Other

## 2015-07-10 DIAGNOSIS — C541 Malignant neoplasm of endometrium: Secondary | ICD-10-CM | POA: Diagnosis present

## 2015-07-10 LAB — BASIC METABOLIC PANEL
Anion Gap: 12 mEq/L — ABNORMAL HIGH (ref 3–11)
BUN: 18.5 mg/dL (ref 7.0–26.0)
CO2: 24 mEq/L (ref 22–29)
Calcium: 9.6 mg/dL (ref 8.4–10.4)
Chloride: 103 mEq/L (ref 98–109)
Creatinine: 0.9 mg/dL (ref 0.6–1.1)
EGFR: 63 mL/min/{1.73_m2} — ABNORMAL LOW (ref >90)
Glucose: 143 mg/dl — ABNORMAL HIGH (ref 70–140)
Potassium: 4.1 mEq/L (ref 3.5–5.1)
Sodium: 139 mEq/L (ref 136–145)

## 2015-07-11 ENCOUNTER — Ambulatory Visit (HOSPITAL_COMMUNITY)
Admission: RE | Admit: 2015-07-11 | Discharge: 2015-07-11 | Disposition: A | Discharge status: Home / Self Care | Payer: Medicare Other | Source: Ambulatory Visit | Attending: Gynecologic Oncology | Admitting: Gynecologic Oncology

## 2015-07-11 DIAGNOSIS — K8020 Calculus of gallbladder without cholecystitis without obstruction: Secondary | ICD-10-CM | POA: Insufficient documentation

## 2015-07-11 DIAGNOSIS — R590 Localized enlarged lymph nodes: Secondary | ICD-10-CM | POA: Diagnosis not present

## 2015-07-11 DIAGNOSIS — R911 Solitary pulmonary nodule: Secondary | ICD-10-CM | POA: Insufficient documentation

## 2015-07-11 DIAGNOSIS — C541 Malignant neoplasm of endometrium: Secondary | ICD-10-CM | POA: Diagnosis not present

## 2015-07-11 DIAGNOSIS — I700 Atherosclerosis of aorta: Secondary | ICD-10-CM | POA: Diagnosis not present

## 2015-07-11 MED ORDER — IOHEXOL 300 MG/ML  SOLN
75.0000 mL | Freq: Once | INTRAMUSCULAR | Status: AC | PRN
  Administered 2015-07-11: 75 mL via INTRAVENOUS

## 2015-07-14 ENCOUNTER — Telehealth: Payer: Self-pay

## 2015-07-14 NOTE — Telephone Encounter (Signed)
Patient contacted and updated that Dr Alycia Rossetti will be contacting her with her with the CT results from 07/11/2015 . Patient states understanding , denies further questions at this time.

## 2015-07-15 ENCOUNTER — Telehealth: Payer: Self-pay | Admitting: Gynecologic Oncology

## 2015-07-15 NOTE — Telephone Encounter (Signed)
LM for patient [name]

## 2015-07-19 ENCOUNTER — Other Ambulatory Visit: Payer: Self-pay | Admitting: Oncology

## 2015-07-19 ENCOUNTER — Ambulatory Visit: Payer: Medicare Other | Attending: Gynecologic Oncology | Admitting: Gynecologic Oncology

## 2015-07-19 ENCOUNTER — Encounter: Payer: Self-pay | Admitting: Gynecologic Oncology

## 2015-07-19 VITALS — BP 172/73 | HR 72 | Temp 98.8°F | Resp 20 | Ht 59.0 in | Wt 110.3 lb

## 2015-07-19 DIAGNOSIS — I10 Essential (primary) hypertension: Secondary | ICD-10-CM | POA: Insufficient documentation

## 2015-07-19 DIAGNOSIS — Z7982 Long term (current) use of aspirin: Secondary | ICD-10-CM | POA: Diagnosis not present

## 2015-07-19 DIAGNOSIS — M5136 Other intervertebral disc degeneration, lumbar region: Secondary | ICD-10-CM | POA: Diagnosis not present

## 2015-07-19 DIAGNOSIS — E785 Hyperlipidemia, unspecified: Secondary | ICD-10-CM | POA: Diagnosis not present

## 2015-07-19 DIAGNOSIS — R002 Palpitations: Secondary | ICD-10-CM | POA: Diagnosis not present

## 2015-07-19 DIAGNOSIS — M549 Dorsalgia, unspecified: Secondary | ICD-10-CM | POA: Insufficient documentation

## 2015-07-19 DIAGNOSIS — R911 Solitary pulmonary nodule: Secondary | ICD-10-CM | POA: Diagnosis not present

## 2015-07-19 DIAGNOSIS — Z170 Estrogen receptor positive status [ER+]: Secondary | ICD-10-CM | POA: Insufficient documentation

## 2015-07-19 DIAGNOSIS — I2510 Atherosclerotic heart disease of native coronary artery without angina pectoris: Secondary | ICD-10-CM | POA: Diagnosis not present

## 2015-07-19 DIAGNOSIS — C541 Malignant neoplasm of endometrium: Secondary | ICD-10-CM | POA: Diagnosis not present

## 2015-07-19 DIAGNOSIS — E7800 Pure hypercholesterolemia, unspecified: Secondary | ICD-10-CM | POA: Diagnosis not present

## 2015-07-19 DIAGNOSIS — Z9221 Personal history of antineoplastic chemotherapy: Secondary | ICD-10-CM | POA: Diagnosis not present

## 2015-07-19 DIAGNOSIS — K8020 Calculus of gallbladder without cholecystitis without obstruction: Secondary | ICD-10-CM | POA: Insufficient documentation

## 2015-07-19 DIAGNOSIS — I493 Ventricular premature depolarization: Secondary | ICD-10-CM | POA: Diagnosis not present

## 2015-07-19 DIAGNOSIS — Z923 Personal history of irradiation: Secondary | ICD-10-CM | POA: Diagnosis not present

## 2015-07-19 DIAGNOSIS — Z87891 Personal history of nicotine dependence: Secondary | ICD-10-CM | POA: Diagnosis not present

## 2015-07-19 DIAGNOSIS — N8189 Other female genital prolapse: Secondary | ICD-10-CM | POA: Diagnosis not present

## 2015-07-19 DIAGNOSIS — Z853 Personal history of malignant neoplasm of breast: Secondary | ICD-10-CM | POA: Insufficient documentation

## 2015-07-19 DIAGNOSIS — K5730 Diverticulosis of large intestine without perforation or abscess without bleeding: Secondary | ICD-10-CM | POA: Insufficient documentation

## 2015-07-19 NOTE — Patient Instructions (Signed)
We will call you with an appointment to meet with Dr. Marko Plume.  Pembrolizumab injection What is this medicine? PEMBROLIZUMAB (pem broe liz ue mab) is a monoclonal antibody. It is used to treat melanoma and non-small cell lung cancer. This medicine may be used for other purposes; ask your health care provider or pharmacist if you have questions. What should I tell my health care provider before I take this medicine? They need to know if you have any of these conditions: -diabetes -immune system problems -inflammatory bowel disease -liver disease -lung or breathing disease -lupus -an unusual or allergic reaction to pembrolizumab, other medicines, foods, dyes, or preservatives -pregnant or trying to get pregnant -breast-feeding How should I use this medicine? This medicine is for infusion into a vein. It is given by a health care professional in a hospital or clinic setting. A special MedGuide will be given to you before each treatment. Be sure to read this information carefully each time. Talk to your pediatrician regarding the use of this medicine in children. Special care may be needed. Overdosage: If you think you have taken too much of this medicine contact a poison control center or emergency room at once. NOTE: This medicine is only for you. Do not share this medicine with others. What if I miss a dose? It is important not to miss your dose. Call your doctor or health care professional if you are unable to keep an appointment. What may interact with this medicine? Interactions have not been studied. Give your health care provider a list of all the medicines, herbs, non-prescription drugs, or dietary supplements you use. Also tell them if you smoke, drink alcohol, or use illegal drugs. Some items may interact with your medicine. This list may not describe all possible interactions. Give your health care provider a list of all the medicines, herbs, non-prescription drugs, or dietary  supplements you use. Also tell them if you smoke, drink alcohol, or use illegal drugs. Some items may interact with your medicine. What should I watch for while using this medicine? Your condition will be monitored carefully while you are receiving this medicine. You may need blood work done while you are taking this medicine. Do not become pregnant while taking this medicine or for 4 months after stopping it. Women should inform their doctor if they wish to become pregnant or think they might be pregnant. There is a potential for serious side effects to an unborn child. Talk to your health care professional or pharmacist for more information. Do not breast-feed an infant while taking this medicine or for 4 months after the last dose. What side effects may I notice from receiving this medicine? Side effects that you should report to your doctor or health care professional as soon as possible: -allergic reactions like skin rash, itching or hives, swelling of the face, lips, or tongue -bloody or black, tarry stools -breathing problems -change in the amount of urine -changes in vision -chest pain -chills -dark urine -dizziness or feeling faint or lightheaded -fast or irregular heartbeat -fever -flushing -hair loss -muscle pain -muscle weakness -persistent headache -signs and symptoms of high blood sugar such as dizziness; dry mouth; dry skin; fruity breath; nausea; stomach pain; increased hunger or thirst; increased urination -signs and symptoms of liver injury like dark urine, light-colored stools, loss of appetite, nausea, right upper belly pain, yellowing of the eyes or skin -stomach pain -weight loss Side effects that usually do not require medical attention (Report these to your doctor or  health care professional if they continue or are bothersome.):constipation -cough -diarrhea -joint pain -tiredness This list may not describe all possible side effects. Call your doctor for medical  advice about side effects. You may report side effects to FDA at 1-800-FDA-1088. Where should I keep my medicine? This drug is given in a hospital or clinic and will not be stored at home. NOTE: This sheet is a summary. It may not cover all possible information. If you have questions about this medicine, talk to your doctor, pharmacist, or health care provider.    2016, [employer]. (2014-06-28 17:24:19)

## 2015-07-19 NOTE — Progress Notes (Signed)
Follow Up Note: Gyn-Onc  [name] 75 y.o. female  CC:  Chief Complaint  Patient presents with  . Follow-up    HPI:  [name] is seen for continuing attention to her recurrent endometrial carcinoma, post cycle 6 chemotherapy on 5/13. Cycle 5 was delayed a week due to counts, then taxol only given on 4-16 due to platelets of only 90K that day; she did have neulasta on 08-30-2011 (then enjoyed 2 weeks at beach). She then had radiation to persistent disease on the left pelvic side wall in 8/13.  History includes microinvasive T1N0 right breast cancer, which was ER/PR positive and HER-2 2+ with insufficient material for FISH at diagnosis in 03/1999, treated with lumpectomy with 2 sentinel node evaluation, local radiation and 5 years of Tamoxifen thru 05/2004, then aromatase inhibitor 07/2004 thru 04/2006.  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 She had 6 cycles of taxol/carboplatin through Jan 2010, and vaginal cuff brachytherapy. She did well until some vague LUQ symptoms in Nov. 2012, with CT AP in [facility] Mar 22, 2011 showing left pelvic sidewall involvement. She was taken to laparoscopic evaluation 04-25-2011, with findings of dense fibrosis in the area as well as mass encasing obturator nerve and vein as well as internal iliac vasculature; left ureterolysis was performed. No pathology was submitted from that procedure. We proceeded with initial 3 additional cycles of taxol/ carboplatin (with carbo skin tests) from 05-16-2011 thru 06-28-2011, then repeated CT AP on 07-17-2011. The CT showed improvement in the left pelvic sidewall mass now 1.9 x 2 cm and 3.4 cm cephalocaudad, as compared with 2.5 x 2.2 cm and 5.4 cm on CT 03-22-11, and no progressive disease elsewhere. She saw me after the CT, with recommendation for an additonal 3 cycles of same chemotherapy  then repeat scan. Cycle 4 was taxol + [place], with carbo skin test and neulasta, then cycle 5 as above only taxol.  She then completed radiation therapy under the care of Dr. Sondra Come which she completed in August of 2013. She had a CT scan on March 23, 2012 that revealed:   IMPRESSION:  1. Today's study demonstrates a positive response to therapy with decreased size of left pelvic sidewall nodal masses, as detailed above. No new soft tissue masses or new lymphadenopathy is identified within the abdomen or pelvis. Continue attention on follow-up studies is recommended.  2. Cholelithiasis without findings to suggest acute cholecystitis.  3. Colonic diverticulosis without findings to suggest acute diverticulitis at this time.  4. Mild asymmetric urinary bladder wall thickening is unchanged compared to prior examinations and is nonspecific. No definite bladder wall mass is identified at this time.  5. Additional findings, similar to prior examinations, as above.   There had been another lymph node identified on her CT scan in may that measured 2.1 x 1.7 cm. In reviewing her CT report it states that the nodal mass higher up along the pelvic sidewalls is no longer clearly identified although there continues to be some amorphus soft tissue in the region with the previously noted nodal mass resided. This was confirmed with the reading radiologist and communicated to the patient.   CT 06/22/12:  Normal urinary bladder. Hysterectomy. Decreased size of the left pelvic peripherally enhancing lesion. This measures 1.8 x 1.7 cm  on image 55/series 2 versus 2.4 x 2.1 cm on the prior exam. No  new lesions are identified. No significant free fluid.  Bones/Musculoskeletal: No acute osseous abnormality. Trace L4-L5 anterolisthesis is likely degenerative. There is S-shaped lumbar  spine curvature.  IMPRESSION:  1. Further decreased size of the left pelvic peripherally enhancing lesion.  2. No new sites of new or  progressive disease.  3. Esophageal air fluid level suggests dysmotility or gastroesophageal reflux.  4. Cholelithiasis.   CT 11/02/2012:  The index lesion within the left pelvic side wall measures 2.2 x 2.7 cm, image 55/series 2. Previously this measured 1.8 x 1.7 cm. No new lesions identified. The stomach and small bowel loops appear within normal limits. Normal appearance of the proximal colon. Multiple sigmoid diverticula identified without acute inflammation. Review of the visualized bony structures shows scoliosis and multilevel degenerative disc disease within the lumbar spine.  IMPRESSION:  1. The left pelvic side wall lesion demonstrates mild increase in size from previous exam.  2. No new sites of new or progressive disease.  3. Cholelithiasis.   CT 01/28/13: FINDINGS:  Lung bases are clear. No focal hepatic lesion. The gallbladder, pancreas, spleen, adrenal glands, and kidneys are normal. There are several gallstones within the lumen of the gallbladder. The stomach, small bowel, and cecum are normal. The diverticulum sigmoid colon. Rectum is normal. Abdominal aorta is normal in caliber. No retroperitoneal or periportal lymphadenopathy. No evidence of peritoneal disease. Within the pelvis, the left pelvic sidewall lobular enhancing mass which is slightly increased in size. The lesion is similar in axial dimension measuring 30 x 21 mm compared to the 29 x 22 mm on prior remeasured. In the craniocaudad dimension, lesion measures 26 mm 1ncreased from 21 mm on prior. No additional pelvic lymphadenopathy. Hysterectomy anatomy. Vaginal cuff appears normal. Bladder is normal.  Review of bone windows demonstrates no aggressive osseous lesions.  IMPRESSION:  1. Interval small increase in volume of enhancing mass adjacent to the left pelvic sidewall.  2. No evidence of abdominal or pelvic lymphadenopathy. 75 year old with recurrent uterine papillary serous carcinoma initially stage IB diagnosed in  2009. Most recent imaging in 01/2012 of this year reveals interval enlargement of her left pelvic sidewall mass consistent with nodal disease.   PET 03/10/13: 1. Hypermetabolic mass in the deep left pelvis consistent with metastasis.  2. No additional evidence of local metastasis. No evidence of distant metastasis.  3. Small right lower lobe pulmonary nodule is not hypermetabolic. Recommend attention on follow-up.  She underwent resection of pelvic mass, left sided ureterolysis, intra-operative radiation therapy by radiation oncology, omental J flap, and left ureteral stent placement by urology at [facility]  on 04/02/13.  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  Final pathology resulted:  Omentum, biopsy: Mature adipose tissue, fibrous tissue, calcifications and bland appearing gland consistent with endosalpingiosis.  No diagnostic carcinoma identified, pancytokeratin confirmatory (negative).  Lymph nodes, left obturator, regional resection: Aggregate of lymph node (4.1 cm) involved by poorly differentiated high grade carcinoma (see Light Microscopy).  Abundant tumor necrosis.   She had her cystoscopy and ureteral stent removal on 05/20/2013. She had a CT scan March 17 that revealed no evidence of recurrent disease. She comes in today for interval followup.  She had a CT scan performed on 02/05/14: IMPRESSION:  No CT findings for recurrent or metastatic disease involving the abdomen/ pelvis.  She had been  having increasing issues over the past 3-4 weeks with increasing back pain. She has been told she has DJD and in the past this pain has been relieved with stress dose steroids, but this time it has not helped.   MRI lumbar spine at [facility] on 06-13-2014 showed known scoliosis and degenerative disc  disease, with new finding of 2.7 x 2.4 x 1.9 cm soft tissue mass at L3-4 involving left neural foramen, with focal cortical destruction of adjacent L3 vertebral body and portion of left L4 pedicle. She had CT biopsy on 06-21-14, with pathology ([id number]) poorly differentiated carcinoma consistent with high grade serous. Bone scan 06-22-14 had uptake posteriorly in upper lumbar spine and a subtle increased area of uptake right greater trochanter of unclear significance. IMRT by Dr Sondra Come 2-29 thru 08-12-14. First zometa 07-07-14-6/16.  Post-XRT CT scan revealed: IMPRESSION: 1. Since the biopsy study of 06/21/2014, similar to slight decrease in size of a left paraspinous lesion at L3-4. 2. No new sites of metastatic disease identified. 3. 8 mm ground-glass nodule in the right lower lobe is grossly similar to 03/10/2013, suggesting a benign etiology. 4. Cholelithiasis. 5. Apparent sigmoid colonic wall thickening which could be due to underdistention. Colitis felt less likely. 6. Atherosclerosis, including within the coronary arteries. 7. Pelvic floor laxity.  CT 10/07/14: Musculoskeletal: Stable to slight interval decrease in size of left paraspinous lesion at the L3-4 level with peripheral enhancement measuring 1.6 x 1.7 cm (image 72; series 2), previously 2.0 x 1.8 cm.  IMPRESSION: Stable to slight interval decrease in size of left paraspinous lesion at L3-4.  No new sites of metastatic disease.  01/19/15: The left paraspinous lesion is again noted. Today this measures 11 mm, image 70/ series 2. Previously 1.7 cm. There are no new bone lesions identified.  IMPRESSION: 1. Decrease in size of left paraspinous metastasis. 2. No new sites of disease. 3. Stable ground-glass attenuating nodule within the superior segment of right lower lobe. 4. Gallstones.  PET 05/11/15: IMPRESSION: 1. The small soft tissue mass just lateral to the left L3 for neural foramen is hypermetabolic, favoring  malignancy. 2. There is also a small focus of hypermetabolic activity between the left L1 and L2 transverse processes, but without a CT correlate. 3. The 13 mm sub solid nodule in the superior segment right lower lobe is stable from recent exams but has slowly increased in size over the last 7 years. Although not hypermetabolic, the appearance is concerning for the possibility of a low grade adenocarcinoma.  She saw Dr. Roxan Hockey and cardiothoracic surgery in January. They recommended discuss the possibility of stereotactic radiation with Dr. Sondra Come versus follow-up. I have discussed the PET scan results with her. At that time his her head not been significant growth and there is no room to proceed with additional radiation she opted for short interval follow-up as her quality of life is good and she was completely asymptomatic. She comes in today in follow-up with a CT scan of the chest, abdomen and pelvis on 07/11/2015.  IMPRESSION: 1. 13 mm mixed solid and sub solid nodule in the posterior right lower lobe was present in 2009 and has clearly progressed in the interval since that study. Imaging features remain highly concerning for low-grade or well differentiated adenocarcinoma. 2. Slight increase size of in the hypermetabolic, rim enhancing nodule identified adjacent to the left L3-4 neural foramen. Metastatic disease remains a concern. 3. No evidence for discrete soft tissue lesion between the left L1 and 2  transverse processes, the site of focal FDG uptake on the recent PET-CT. 4. Cholelithiasis. 5. Abdominal aortic atherosclerosis.  The patient comes in today with her husband and daughter to review the CT scan results. I wrote down measurements of this mass since the scan in September 2016. We started a lesion that measured 1.1 cm and most recently it measures approximate 1.6 cm. We do not always have 2 x 2 dimensions on the CT scans and that was discussed with the patient and her family. When  she and I had spoken about the PET scan and January she went to have close interval follow-up. Today she's not sure and she's thinking she may want to proceed with chemotherapy. We discussed proceeding with standard chemotherapy with paclitaxel and carboplatin. She has responded well to this in the past and has not had chemotherapy since 2013. Her daughter asked whether or not she would need to be on Zometa. As this lesion does not appear to be in the bone but is in the paraspinal musculature I do not believe that she'll require Zometa. She also wanted to know if there is any new exciting areas of research and we discussed the possibility of [place]. It isn't FDA approved medication but is not FDA approved for the indication of endometrial cancer. I discussed with her that there is a great deal of interest in the PD-1 inhibitors for all gynecologic malignancies however we do not have a robust amount of data at this time. At this point she is leaning towards starting with chemotherapy with paclitaxel and carboplatin. She would like [place] to inquire with her insurance company whether or not [name] would be covered should she need it moving forward. I discussed with her and her family that there are several trials currently in endometrial cancers in this drug and it may be that down the line we'll have evidence to be able to justify treatment or there may be a clinical trial in the local regional area.  Review of Systems  Constitutional: Feels well    Current Meds:  Outpatient Encounter Prescriptions as of 07/19/2015  Medication Sig  . amLODipine (NORVASC) 5 MG tablet Take 5 mg by mouth daily.  Marland Kitchen aspirin EC 81 MG tablet Take 81 mg by mouth daily.  Marland Kitchen atenolol (TENORMIN) 25 MG tablet Take 25 mg by mouth 2 (two) times daily.   . Biotin 5000 MCG TABS Take 5,000 mcg by mouth daily.  . cholecalciferol (VITAMIN D) 1000 UNITS tablet Take 1,000 Units by mouth every Monday, Wednesday, and Friday.  . Cyanocobalamin  (VITAMIN B-12) 5000 MCG SUBL Place 1 tablet under the tongue daily.   Marland Kitchen ibuprofen (ADVIL,MOTRIN) 800 MG tablet Reported on 07/19/2015  . Magnesium 250 MG TABS Take 250 mg by mouth every Monday, Wednesday, and Friday.   . Multiple Vitamin (MULTIVITAMIN WITH MINERALS) TABS tablet Take 1 tablet by mouth daily.  . Omega-3 Fatty Acids (FISH OIL PO) Take 360 mg by mouth daily.  . potassium gluconate 595 MG TABS tablet Take 595 mg by mouth every Monday, Wednesday, and Friday.  . simvastatin (ZOCOR) 20 MG tablet Take 20 mg by mouth daily.  Marland Kitchen ALPRAZolam (XANAX) 0.25 MG tablet Take 0.25 mg by mouth 2 (two) times daily as needed for anxiety. Reported on 07/19/2015  . naproxen (NAPROSYN) 500 MG tablet Take 1 tablet (500 mg total) by mouth daily. (Patient not taking: Reported on 07/19/2015)   No facility-administered encounter medications on file as of 07/19/2015.    Allergy:  Allergies  Allergen Reactions  . Ace Inhibitors Anaphylaxis  . Codeine Nausea Only  . Demerol Nausea Only  . Dilaudid [Hydromorphone Hcl]     "total loss of her mind"    Social Hx:   Social History   Social History  . Marital Status: Married    Spouse Name: N/A  . Number of Children: N/A  . Years of Education: N/A   Occupational History  . Not on file.   Social History Main Topics  . Smoking status: Former Smoker    Types: Cigarettes    Quit date: 05/13/1962  . Smokeless tobacco: Never Used  . Alcohol Use: 3.0 oz/week    5 Glasses of wine per week  . Drug Use: No  . Sexual Activity: No   Other Topics Concern  . Not on file   Social History Narrative    Past Surgical Hx:  Past Surgical History  Procedure Laterality Date  . Total abdominal hysterectomy w/ bilateral salpingoophorectomy    . Breast lumpectomy    . Appendectomy    . Tonsillectomy    . Foot surgery      RIGHT    Past Medical Hx:  Past Medical History  Diagnosis Date  . Hypertension   . Palpitations   . History of breast cancer 2000     right breast -- treated with lumpectomy and radiation therarpy, postoperatively with tamoxifen   . Hyperlipidemia   . Hypercholesterolemia   . PVC's (premature ventricular contractions)   . S/P radiation therapy 10/28/11-12/05/11    5040 cGy left pelvis  . S/P radiation therapy     Intracavitary brachytherapy of uterus  . Radiation 07/11/14-08/12/14    left lumbar paraspinal area 55 gray  . Endometrial cancer ([place]) 12/2007    s/p total abdominal hysterectomy at [facility] - ovaries were also removed     Family Hx:  Family History  Problem Relation Age of Onset  . Thrombosis Father     coronary thrombosis  . Hypertension Father   . Heart failure Mother   . Coronary artery disease Brother     Vitals:  Blood pressure 172/73, pulse 72, temperature 98.8 F (37.1 C), temperature source Oral, resp. rate 20, height _0  (1.499 m), weight 110 lb 4.8 oz (50.032 kg), SpO2 100 %.  Physical Exam:  General: Well developed, well nourished female in no acute distress. Alert and oriented x 3.   Assessment/Plan:  75 year old with recurrent uterine papillary serous carcinoma initially stage IB diagnosed in 2009.  She is s/p resection of pelvic mass, left sided ureterolysis, intra-operative radiation therapy by radiation oncology, omental J flap, and left ureteral stent placement by urology at [facility]  on 04/02/13.  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   She then unfortunately suffered a recurrence in the left paraspinal space and was treated with Zometa as well as IMRT. Imaging shows that the lesion was initially smaller but slowly has grown in size. Also pulmonary nodule was identified that has been fairly slow growing. It has definitely grown since 2009. She has been seen by a thoracic surgery and by Dr. Sondra Come Dr.  Sondra Come did not feel that there is any more room for radiation therapy for paraspinal mass. She was asymptomatic and feeling well she wanted short-term follow-up at this  time is leaning towards wishing to proceed with chemotherapy. Her last cycle of chemotherapy was in 2003. We will schedule her to see Dr. Marko Plume. We will follow-up with her thoracic surgeon (Dr. Roxan Hockey) to see what follow-up he would want for her.  Greater than 40 minutes face to face time was spent with the patient and her family. All of the time was spent in consultation.   [name] A.,  07/19/2015, 12:00 PM

## 2015-07-21 ENCOUNTER — Telehealth: Payer: Self-pay | Admitting: *Deleted

## 2015-07-21 ENCOUNTER — Telehealth: Payer: Self-pay | Admitting: Oncology

## 2015-07-21 ENCOUNTER — Telehealth: Payer: Self-pay | Admitting: Gynecologic Oncology

## 2015-07-21 NOTE — Telephone Encounter (Signed)
Informed patient that she should be receiving a phone call from the scheduler's about making an appt with Dr. Marko Plume on March 20 per [facility].  Also informed her that we received an EPIC message back from Dr. Skipper Cliche stating the lung nodule has not changed and that she has an appt in early April.  Also informed the patient that Dr. Marko Plume was going to have the pharmacist go ahead and look into approval for [place] with her insurance.  It sounds like [name] would like to proceed with carboplatin and taxol initially but would like to know if [name] would be approved.  Advised to call for any questions or concerns.

## 2015-07-21 NOTE — Telephone Encounter (Signed)
s.w. pt husband and advised on March appt...ok and aware

## 2015-07-21 NOTE — Telephone Encounter (Signed)
Per staff message and POF I have scheduled appts. Advised scheduler of appts. JMW  

## 2015-07-31 ENCOUNTER — Encounter: Payer: Self-pay | Admitting: Oncology

## 2015-07-31 ENCOUNTER — Ambulatory Visit (HOSPITAL_BASED_OUTPATIENT_CLINIC_OR_DEPARTMENT_OTHER): Payer: Medicare Other | Admitting: Oncology

## 2015-07-31 ENCOUNTER — Other Ambulatory Visit (HOSPITAL_BASED_OUTPATIENT_CLINIC_OR_DEPARTMENT_OTHER): Payer: Medicare Other

## 2015-07-31 ENCOUNTER — Telehealth: Payer: Self-pay | Admitting: Oncology

## 2015-07-31 ENCOUNTER — Other Ambulatory Visit: Payer: Self-pay | Admitting: Oncology

## 2015-07-31 VITALS — BP 160/79 | HR 72 | Temp 98.6°F | Resp 18 | Ht 59.0 in | Wt 110.8 lb

## 2015-07-31 DIAGNOSIS — Z853 Personal history of malignant neoplasm of breast: Secondary | ICD-10-CM | POA: Diagnosis not present

## 2015-07-31 DIAGNOSIS — C541 Malignant neoplasm of endometrium: Secondary | ICD-10-CM

## 2015-07-31 DIAGNOSIS — C7951 Secondary malignant neoplasm of bone: Secondary | ICD-10-CM

## 2015-07-31 LAB — CBC WITH DIFFERENTIAL/PLATELET
BASO%: 0.5 % (ref 0.0–2.0)
Basophils Absolute: 0 10*3/uL (ref 0.0–0.1)
EOS%: 6 % (ref 0.0–7.0)
Eosinophils Absolute: 0.3 10*3/uL (ref 0.0–0.5)
HCT: 38 % (ref 34.8–46.6)
HGB: 12.7 g/dL (ref 11.6–15.9)
LYMPH%: 19 % (ref 14.0–49.7)
MCH: 32.4 pg (ref 25.1–34.0)
MCHC: 33.4 g/dL (ref 31.5–36.0)
MCV: 96.9 fL (ref 79.5–101.0)
MONO#: 0.8 10*3/uL (ref 0.1–0.9)
MONO%: 15.4 % — ABNORMAL HIGH (ref 0.0–14.0)
NEUT#: 3.2 10*3/uL (ref 1.5–6.5)
NEUT%: 59.1 % (ref 38.4–76.8)
Platelets: 191 10*3/uL (ref 145–400)
RBC: 3.92 10*6/uL (ref 3.70–5.45)
RDW: 13.8 % (ref 11.2–14.5)
WBC: 5.5 10*3/uL (ref 3.9–10.3)
lymph#: 1 10*3/uL (ref 0.9–3.3)

## 2015-07-31 LAB — COMPREHENSIVE METABOLIC PANEL
ALT: 17 U/L (ref 0–55)
AST: 29 U/L (ref 5–34)
Albumin: 3.8 g/dL (ref 3.5–5.0)
Alkaline Phosphatase: 44 U/L (ref 40–150)
Anion Gap: 10 mEq/L (ref 3–11)
BUN: 23.1 mg/dL (ref 7.0–26.0)
CO2: 28 mEq/L (ref 22–29)
Calcium: 9.4 mg/dL (ref 8.4–10.4)
Chloride: 103 mEq/L (ref 98–109)
Creatinine: 0.8 mg/dL (ref 0.6–1.1)
EGFR: 71 mL/min/{1.73_m2} — ABNORMAL LOW (ref >90)
Glucose: 103 mg/dl (ref 70–140)
Potassium: 4 mEq/L (ref 3.5–5.1)
Sodium: 141 mEq/L (ref 136–145)
Total Bilirubin: 0.93 mg/dL (ref 0.20–1.20)
Total Protein: 7.4 g/dL (ref 6.4–8.3)

## 2015-07-31 MED ORDER — LORAZEPAM 0.5 MG PO TABS: ORAL_TABLET | ORAL | Status: DC

## 2015-07-31 MED ORDER — ONDANSETRON HCL 8 MG PO TABS
8.0000 mg | ORAL_TABLET | Freq: Three times a day (TID) | ORAL | Status: DC | PRN
Start: 2015-07-31 — End: 2015-09-12

## 2015-07-31 MED ORDER — DEXAMETHASONE 4 MG PO TABS: ORAL_TABLET | ORAL | Status: DC

## 2015-07-31 NOTE — Telephone Encounter (Signed)
appt made and avs to printed at end of treatment today

## 2015-07-31 NOTE — Progress Notes (Signed)
OFFICE PROGRESS NOTE   July 31, 2015   Physicians: P.Gehrig, J.Kinard, (T.Brackbill) J.Russo, S.Ganem  INTERVAL HISTORY:   Patient is seen, together with husband and at request of Dr Alycia Rossetti, for consideration of additional chemotherapy for some progression by imaging of recurrent endometrial carcinoma in L1-2 area and possibly a RLL lung nodule. She was last treated with carbo taxol using every 3 week regimen 09-2011, and had IMRT to lumbar spine with a few cycles of zometa thru 08-2014. She is not obviously symptomatic from the present involvement, which was initially found on PET 05-11-15 and confirmed on CT CAP 07-11-15. Past history is significant for right breast cancer 2000 treated with tamoxifen. Most recent bilateral mammograms 10-18-2014.  She is to see Dr Virgina Jock upcoming for yearly PE, and I have encouraged her to keep that appointment if possible. I will update him by this note also   Patient has some chronic back problems related to scoliosis, but is not aware of any obviously different symptoms there. She works out at gym daily, uses occasional naprosyn or motrin (not daily) when back is uncomfortable late in day. Appetite is good, energy good, still some neuropathy in fingers and feet which is not interfering with activity. Feet are better in certain shoes. She denies SOB, chest pain, cough. No LE swelling. No bleeding. No noted changes in breasts. No nausea or vomiting. Bowels move regularly.  No fever or symptoms of infection now, had viral URI x1 this winter. Remainder of 10 point Review of Systems negative / unchanged.  No central catheter Flu vaccine 01-26-15  Daughter is for hysterectomy on 08-24-15, so will try to arrange chemo schedule to allow patient to assist. 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  ONCOLOGIC HISTORY BREAST CANCER: microinvasive T1N0 right breast cancer which was ER/PR positive and HER-2 2+  with insufficient material for FISH at diagnosis in 03/1999, treated with lumpectomy with 2 sentinel node evaluation, local radiation and 5 years of Tamoxifen thru 05/2004, then aromatase inhibitor 07/2004 thru 04/2006.   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 She had 6 cycles of taxol/carboplatin through Jan 2010, and vaginal cuff brachytherapy. She did well until some vague LUQ symptoms in Nov. 2012, with CT AP in [facility] Mar 22, 2011 showing left pelvic sidewall involvement. She had laparoscopic evaluation at [facility] by [place] 04-25-2011, with finding of dense fibrosis in the area as well as mass encasing obturator nerve and vein as well as internal iliac vasculature; left ureterolysis was performed. No pathology was submitted from that procedure. She received 3 additional cycles of taxol/ carboplatin from 05-16-2011 thru 06-28-2011. CT AP on 07-17-2011 showed improvement in the left pelvic sidewall mass now 1.9 x 2 cm and 3.4 cm cephalocaudad, as compared with 2.5 x 2.2 cm and 5.4 cm on CT 03-22-11, and no progressive disease elsewhere. She saw Dr.Gehrig after the CT, with recommendation for an additonal 3 cycles of same chemotherapy then repeat scan. Cycle 4 was taxol + carbo and neulasta, cycle 5 taxol only due to counts and cycle 6 taxol + carboplatin on 09-17-11. Follow up CT in May 2013 showed enlargement of the left pelvic sidewall involvement. She had IMRT 50 GY in 28 fractions by Dr Sondra Come, that completed 12-05-11. She had regular follow up scans, Including CT AP 01-2014 and PET 02-2013, which  did not show progressive disease. She has scoliosis and some chronic intermittent low back discomfort, but developed different, persistent discomfort low back and radiating to LLE by ~ Dec into Jan 2016. MRI lumbar spine at [facility] on 06-13-2014 showed known  scoliosis and degenerative disc disease, with new finding of 2.7 x 2.4 x 1.9 cm soft tissue mass at L3-4 involving left neural foramen, with focal cortical destruction of adjacent L3 vertebral body and portion of left L4 pedicle. She had CT biopsy on 06-21-14, with pathology ([id number]) poorly differentiated carcinoma consistent with high grade serous. Bone scan 06-22-14 had uptake posteriorly in upper lumbar spine and a subtle increased area of uptake right greater trochanter of unclear significance. IMRT by Dr Sondra Come 2-29 thru 08-12-14. First zometa 07-07-14   Objective:  Vital signs in last 24 hours:  BP 160/79 mmHg  Pulse 72  Temp(Src) 98.6 F (37 C) (Oral)  Resp 18  Ht '4\' 11"'$  (1.499 m)  Wt 110 lb 12.8 oz (50.259 kg)  BMI 22.37 kg/m2  SpO2 99% Weight stable compared with Sept 2016 Alert, oriented and appropriate. Ambulatory without assistance difficulty.  Alopecia  HEENT:PERRL, sclerae not icteric. Oral mucosa moist without lesions, posterior pharynx clear.  Neck supple. No JVD.  Lymphatics:no cervical,supraclavicular, axillary or inguinal adenopathy Resp: clear to auscultation bilaterally and normal percussion bilaterally Cardio: regular rate and rhythm. No gallop. GI: soft, nontender, not distended, no mass or organomegaly. Normally active bowel sounds. Surgical incision not remarkable. Musculoskeletal/ Extremities: without pitting edema, cords, tenderness . Scoliosis obvious, spine not point tender. UE veins look adequate to begin chemo Neuro: minimal peripheral neuropathy. Otherwise nonfocal. PSYCH appropriate mood and affect Skin without rash, ecchymosis, petechiae Breasts: right breast with well healed lumpectomy scar, otherwise bilaterally without dominant mass, skin or nipple findings. Axillae benign.   Lab Results:  Results for orders placed or performed in visit on 07/31/15  CBC with Differential  Result Value Ref Range   WBC 5.5 3.9 - 10.3 10e3/uL   NEUT# 3.2 1.5 - 6.5  10e3/uL   HGB 12.7 11.6 - 15.9 g/dL   HCT 38.0 34.8 - 46.6 %   Platelets 191 145 - 400 10e3/uL   MCV 96.9 79.5 - 101.0 fL   MCH 32.4 25.1 - 34.0 pg   MCHC 33.4 31.5 - 36.0 g/dL   RBC 3.92 3.70 - 5.45 10e6/uL   RDW 13.8 11.2 - 14.5 %   lymph# 1.0 0.9 - 3.3 10e3/uL   MONO# 0.8 0.1 - 0.9 10e3/uL   Eosinophils Absolute 0.3 0.0 - 0.5 10e3/uL   Basophils Absolute 0.0 0.0 - 0.1 10e3/uL   NEUT% 59.1 38.4 - 76.8 %   LYMPH% 19.0 14.0 - 49.7 %   MONO% 15.4 (H) 0.0 - 14.0 %   EOS% 6.0 0.0 - 7.0 %   BASO% 0.5 0.0 - 2.0 %  Comprehensive metabolic panel  Result Value Ref Range   Sodium 141 136 - 145 mEq/L   Potassium 4.0 3.5 - 5.1 mEq/L   Chloride 103 98 - 109 mEq/L   CO2 28 22 - 29 mEq/L   Glucose 103 70 - 140 mg/dl   BUN 23.1 7.0 - 26.0 mg/dL   Creatinine 0.8 0.6 - 1.1 mg/dL   Total Bilirubin 0.93 0.20 - 1.20 mg/dL   Alkaline Phosphatase 44 40 - 150 U/L   AST 29 5 - 34 U/L   ALT 17 0 - 55 U/L   Total Protein 7.4 6.4 - 8.3 g/dL  Albumin 3.8 3.5 - 5.0 g/dL   Calcium 9.4 8.4 - 18.9 mg/dL   Anion Gap 10 3 - 11 mEq/L   EGFR 71 (L) >90 ml/min/1.73 m2     Studies/Results:  EXAM: NUCLEAR MEDICINE PET SKULL BASE TO THIGH  05-10-16 TECHNIQUE: 5.3 mCi F-18 FDG was injected intravenously. Full-ring PET imaging was performed from the skull base to thigh after the radiotracer. CT data was obtained and used for attenuation correction and anatomic localization.  FASTING BLOOD GLUCOSE: Value: 94 mg/dl  COMPARISON: Multiple exams, including 05/02/2015 and 03/10/2013  FINDINGS: NECK  No hypermetabolic lymph nodes in the neck. Mild chronic bilateral maxillary sinusitis.  CHEST  No hypermetabolic mediastinal or hilar nodes. 13 mm sub solid nodule in the superior segment right lower lobe, not hypermetabolic, but slowly increasing in size of the last 7 years.  Coronary, aortic arch, and branch vessel atherosclerotic vascular disease. Right axillary clips  noted.  ABDOMEN/PELVIS  No abnormal hypermetabolic activity within the liver, pancreas, adrenal glands, or spleen. No hypermetabolic lymph nodes in the abdomen or pelvis.  Cholelithiasis with multiple medium size gallstones observed. Aortoiliac atherosclerotic vascular disease. Sigmoid colon diverticulosis.  SKELETON/MUSCULOSKELETAL  The soft tissue mass just lateral to the left L3-4 neural foramen has a maximum standard uptake value of 7.5. Between the left L1 and left L2 transverse processes, there is a focus of hypermetabolic activity with maximum standard uptake value 3.6, without CT correlate.  Levoconvex lumbar scoliosis.  IMPRESSION: 1. The small soft tissue mass just lateral to the left L3 for neural foramen is hypermetabolic, favoring malignancy. 2. There is also a small focus of hypermetabolic activity between the left L1 and L2 transverse processes, but without a CT correlate. 3. The 13 mm sub solid nodule in the superior segment right lower lobe is stable from recent exams but has slowly increased in size over the last 7 years. Although not hypermetabolic, the appearance is concerning for the possibility of a low grade adenocarcinoma. 4. Other imaging findings of potential clinical significance: Chronic bilateral maxillary sinusitis. Coronary, aortic arch, and branch vessel atherosclerotic vascular disease. Aortoiliac atherosclerotic vascular disease. Cholelithiasis. Sigmoid colon diverticulosis. Lumbar scoliosis.    CT CHEST, ABDOMEN, AND PELVIS WITH CONTRAST   07-11-15  COMPARISON: PET-CT from 05/11/2015. Abdomen and pelvis CT from 05/02/2015. Chest CT from 01/19/2015. Chest CT from 12/01/2007.  FINDINGS: CT CHEST FINDINGS  Mediastinum/Lymph Nodes: Surgical clips are noted in the right axilla. No axillary lymphadenopathy. No mediastinal lymphadenopathy. There is no hilar lymphadenopathy. The heart size is normal. No pericardial effusion.  Coronary artery calcification is noted. The esophagus has normal imaging features.  Lungs/Pleura: 13 mm sub solid nodule in the superior segment right lower lobe (image 23 series 5) is stable since the PET-CT. This lesion appears to have a solid component along its superior margin. Left lung remains clear. No pulmonary edema or pleural effusion. No focal airspace consolidation.  Musculoskeletal: Bone windows reveal no worrisome lytic or sclerotic osseous lesions.  CT ABDOMEN PELVIS FINDINGS  Hepatobiliary: No focal abnormality within the liver parenchyma. Multiple 5 mm gallstones noted. No intrahepatic or extrahepatic biliary dilation.  Pancreas: No focal mass lesion. No dilatation of the main duct. No intraparenchymal cyst. No peripancreatic edema.  Spleen: No splenomegaly. No focal mass lesion.  Adrenals/Urinary Tract: No adrenal nodule or mass. Cortical thinning is noted in both kidneys without enhancing renal mass. Central sinus cysts are seen in the lower pole the left kidney. No hydroureter. The urinary bladder appears normal  for the degree of distention.  Stomach/Bowel: Stomach is nondistended. No gastric wall thickening. No evidence of outlet obstruction. Duodenum is normally positioned as is the ligament of Treitz. No small bowel wall thickening. No small bowel dilatation. The terminal ileum is normal. Nonvisualization of the appendix is consistent with the reported history of appendectomy. Diverticular changes are noted in the left colon without evidence of diverticulitis.  Vascular/Lymphatic: There is abdominal aortic atherosclerosis without aneurysm. There is no gastrohepatic or hepatoduodenal ligament lymphadenopathy. No intraperitoneal or retroperitoneal lymphadenopathy. No pelvic sidewall lymphadenopathy.  Reproductive: Uterus is surgically absent. There is no adnexal mass.  Other: No intraperitoneal free fluid.  Musculoskeletal: The soft tissue  lesion seen just to the left of the L3 neural foramen on the recent PET-CT persists, shows circumferential rim enhancement common measures 16 mm today compared to 15 mm on the PET-CT. This is well seen on image 69 of series 2. Recent PET-CT also demonstrated hypermetabolism between the left L1 and L2 transverse processes without an underlying lesion on CT imaging. There is no discrete soft tissue lesion at this location on today's CT study.  IMPRESSION: 1. 13 mm mixed solid and sub solid nodule in the posterior right lower lobe was present in 2009 and has clearly progressed in the interval since that study. Imaging features remain highly concerning for low-grade or well differentiated adenocarcinoma. 2. Slight increase size of in the hypermetabolic, rim enhancing nodule identified adjacent to the left L3-4 neural foramen. Metastatic disease remains a concern. 3. No evidence for discrete soft tissue lesion between the left L1 and 2 transverse processes, the site of focal FDG uptake on the recent PET-CT. 4. Cholelithiasis. 5. Abdominal aortic atherosclerosis.  PACs images reviewed for both PET and CT.  Medications: I have reviewed the patient's current medications. Zofran and ativan used as antiemetics previously. Will use premed decadron 20 mg with food 12 hrs prior to weekly taxol.  Preauth for granix requested, to use if needed.  DISCUSSION All of interval history reviewed, recommendation for retreatment with [place] and taxol discussed. I have suggested that she might prefer dose dense regimen, as this tends to be better tolerated overall than the every 3 week, with equivalent efficacy (or possibly better now as she has previously had the q 3 week regimen). Patient tells me she is relieved that there is this different option for the chemo and would like to begin with dose dense regimen. With extent of previous treatment, she will have carbo skin test prior to each carboplatin. She  understands that she may have increase in peripheral neuropathy with further taxol (did not discuss taxotere). She prefers peripheral IV access for now, understands that this will be easier if she is well hydrated. Reminded patient that she needed close attention to hydration with previous chemo.  Daughter's surgery 08-24-15 as above.  If preauthorization accomplished, we will try to begin chemo on 08-03-15.   Discussed aloxi/ EMEND now FDA approved if needed, tho nausea has not been too difficult in past.  (NOTE husband was hospital pharmacist x years). Verbal consent obtained.  Assessment/Plan:  1.Papillary serous endometrial carcinoma IB at diagnosis Aug 2009, recurrent at left pelvic sidewall 03-2011 and progression in L1-2 in spring 2016, now further progression in same lumbar area (+ slighly enlarged RLL pulmonary nodule with dates back several years) . Will begin dose dense carbo taxol on 08-03-15, with carbo skin test and premedication decadron as noted. I will see her back closely, coordinating with treatments. 2.T1N0 right  breast cancer 03-1999: adjuvant therapy included 5 years of tamoxifen. Not recurrent. Up to date on mammograms as of 10-18-14, tomo medically appropriate due to density of breast tissue 3.scoliosis of lumbar spine with degenerative disc disease and chronic symptoms related to this. 4.HTN followed by Dr Virgina Jock 5. flu vaccine done  All questions answered and they know to call if concerns between scheduled appointments. Chemo orders placed, granix requested. [facility] notified. RN to send antiemetics and steroids. Time spent 40 min including >50% counseling and coordination of care   Joal Eakle P, MD   07/31/2015, 6:27 PM

## 2015-08-02 ENCOUNTER — Telehealth: Payer: Self-pay

## 2015-08-02 NOTE — Telephone Encounter (Signed)
lvm that Dr Edwyna Shell said we will be proceeding with carboplatin and taxol on 3/23. She needs to take 5 tablets of decadron ('20mg'$ ) at 930 pm tonight for chemo at 930 am tomorrow. rx was sent to [facility] at [facility]. Also mention ativan and zofran were ordered for antinausea.

## 2015-08-03 ENCOUNTER — Ambulatory Visit (HOSPITAL_BASED_OUTPATIENT_CLINIC_OR_DEPARTMENT_OTHER): Payer: Medicare Other

## 2015-08-03 VITALS — BP 156/74 | HR 73 | Temp 97.2°F

## 2015-08-03 DIAGNOSIS — Z5111 Encounter for antineoplastic chemotherapy: Secondary | ICD-10-CM | POA: Diagnosis present

## 2015-08-03 DIAGNOSIS — C541 Malignant neoplasm of endometrium: Secondary | ICD-10-CM | POA: Diagnosis not present

## 2015-08-03 DIAGNOSIS — C549 Malignant neoplasm of corpus uteri, unspecified: Secondary | ICD-10-CM

## 2015-08-03 MED ORDER — SODIUM CHLORIDE 0.9 % IV SOLN
20.0000 mg | Freq: Once | INTRAVENOUS | Status: DC
  Filled 2015-08-03: qty 2

## 2015-08-03 MED ORDER — PACLITAXEL CHEMO INJECTION 300 MG/50ML
80.0000 mg/m2 | Freq: Once | INTRAVENOUS | Status: AC
  Administered 2015-08-03: 114 mg via INTRAVENOUS
  Filled 2015-08-03: qty 19

## 2015-08-03 MED ORDER — SODIUM CHLORIDE 0.9 % IV SOLN
Freq: Once | INTRAVENOUS | Status: DC
  Filled 2015-08-03: qty 8

## 2015-08-03 MED ORDER — FAMOTIDINE IN NACL 20-0.9 MG/50ML-% IV SOLN
INTRAVENOUS | Status: AC
  Filled 2015-08-03: qty 50

## 2015-08-03 MED ORDER — DIPHENHYDRAMINE HCL 50 MG/ML IJ SOLN
12.5000 mg | Freq: Once | INTRAMUSCULAR | Status: AC
  Administered 2015-08-03: 12.5 mg via INTRAVENOUS

## 2015-08-03 MED ORDER — SODIUM CHLORIDE 0.9 % IV SOLN
254.4000 mg | Freq: Once | INTRAVENOUS | Status: AC
  Administered 2015-08-03: 250 mg via INTRAVENOUS
  Filled 2015-08-03: qty 25

## 2015-08-03 MED ORDER — DIPHENHYDRAMINE HCL 50 MG/ML IJ SOLN
INTRAMUSCULAR | Status: AC
  Filled 2015-08-03: qty 1

## 2015-08-03 MED ORDER — SODIUM CHLORIDE 0.9 % IV SOLN
Freq: Once | INTRAVENOUS | Status: AC
  Administered 2015-08-03: 10:00:00 via INTRAVENOUS

## 2015-08-03 MED ORDER — FAMOTIDINE IN NACL 20-0.9 MG/50ML-% IV SOLN
20.0000 mg | Freq: Once | INTRAVENOUS | Status: AC
  Administered 2015-08-03: 20 mg via INTRAVENOUS

## 2015-08-03 MED ORDER — CARBOPLATIN CHEMO INTRADERMAL TEST DOSE 100MCG/0.02ML
100.0000 ug | Freq: Once | INTRADERMAL | Status: AC
  Administered 2015-08-03: 100 ug via INTRADERMAL
  Filled 2015-08-03: qty 0.01

## 2015-08-03 MED ORDER — SODIUM CHLORIDE 0.9 % IV SOLN
Freq: Once | INTRAVENOUS | Status: AC
  Administered 2015-08-03: 11:00:00 via INTRAVENOUS
  Filled 2015-08-03: qty 8

## 2015-08-03 NOTE — Progress Notes (Signed)
Carbo skin test negative

## 2015-08-03 NOTE — Patient Instructions (Signed)
[facility] Discharge Instructions for Patients Receiving Chemotherapy  Today you received the following chemotherapy agents Taxol/Carboplatin To help prevent nausea and vomiting after your treatment, we encourage you to take your nausea medication as prescribed.   If you develop nausea and vomiting that is not controlled by your nausea medication, call the clinic.   BELOW ARE SYMPTOMS THAT SHOULD BE REPORTED IMMEDIATELY:  *FEVER GREATER THAN 100.5 F  *CHILLS WITH OR WITHOUT FEVER  NAUSEA AND VOMITING THAT IS NOT CONTROLLED WITH YOUR NAUSEA MEDICATION  *UNUSUAL SHORTNESS OF BREATH  *UNUSUAL BRUISING OR BLEEDING  TENDERNESS IN MOUTH AND THROAT WITH OR WITHOUT PRESENCE OF ULCERS  *URINARY PROBLEMS  *BOWEL PROBLEMS  UNUSUAL RASH Items with * indicate a potential emergency and should be followed up as soon as possible.  Feel free to call the clinic you have any questions or concerns. The clinic phone number is [phone].  Please show the CHEMO ALERT CARD at check-in to the Emergency Department and triage nurse.   

## 2015-08-06 ENCOUNTER — Other Ambulatory Visit: Payer: Self-pay | Admitting: Oncology

## 2015-08-06 DIAGNOSIS — C541 Malignant neoplasm of endometrium: Secondary | ICD-10-CM

## 2015-08-07 ENCOUNTER — Telehealth: Payer: Self-pay

## 2015-08-07 ENCOUNTER — Other Ambulatory Visit: Payer: Self-pay | Admitting: Oncology

## 2015-08-07 NOTE — Telephone Encounter (Signed)
lvm f/u chemo on 3/23. Call if any problems, we monitor bowel movements, fluid and food intake, body aches from taxol and any other problems that arise.

## 2015-08-07 NOTE — Telephone Encounter (Signed)
-----   Message from Renford Dills, RN sent at 08/03/2015 10:57 AM EDT ----- Regarding: chemo f/u [place] 1st Taxol/Carbo  (last in 2013)

## 2015-08-10 ENCOUNTER — Encounter: Payer: Self-pay | Admitting: Oncology

## 2015-08-10 ENCOUNTER — Ambulatory Visit (HOSPITAL_BASED_OUTPATIENT_CLINIC_OR_DEPARTMENT_OTHER): Payer: Medicare Other | Admitting: Oncology

## 2015-08-10 ENCOUNTER — Other Ambulatory Visit (HOSPITAL_BASED_OUTPATIENT_CLINIC_OR_DEPARTMENT_OTHER): Payer: Medicare Other

## 2015-08-10 ENCOUNTER — Telehealth: Payer: Self-pay | Admitting: Oncology

## 2015-08-10 ENCOUNTER — Ambulatory Visit (HOSPITAL_BASED_OUTPATIENT_CLINIC_OR_DEPARTMENT_OTHER): Payer: Medicare Other

## 2015-08-10 VITALS — BP 160/86 | HR 83 | Temp 98.0°F | Resp 18 | Ht 59.0 in | Wt 110.8 lb

## 2015-08-10 DIAGNOSIS — I959 Hypotension, unspecified: Secondary | ICD-10-CM | POA: Diagnosis not present

## 2015-08-10 DIAGNOSIS — Z5111 Encounter for antineoplastic chemotherapy: Secondary | ICD-10-CM

## 2015-08-10 DIAGNOSIS — C541 Malignant neoplasm of endometrium: Secondary | ICD-10-CM

## 2015-08-10 DIAGNOSIS — Z853 Personal history of malignant neoplasm of breast: Secondary | ICD-10-CM | POA: Diagnosis not present

## 2015-08-10 DIAGNOSIS — C549 Malignant neoplasm of corpus uteri, unspecified: Secondary | ICD-10-CM

## 2015-08-10 LAB — CBC WITH DIFFERENTIAL/PLATELET
BASO%: 0.3 % (ref 0.0–2.0)
Basophils Absolute: 0 10*3/uL (ref 0.0–0.1)
EOS%: 0 % (ref 0.0–7.0)
Eosinophils Absolute: 0 10*3/uL (ref 0.0–0.5)
HCT: 37.9 % (ref 34.8–46.6)
HGB: 12.5 g/dL (ref 11.6–15.9)
LYMPH%: 10.2 % — ABNORMAL LOW (ref 14.0–49.7)
MCH: 32.1 pg (ref 25.1–34.0)
MCHC: 33 g/dL (ref 31.5–36.0)
MCV: 97.4 fL (ref 79.5–101.0)
MONO#: 0 10*3/uL — ABNORMAL LOW (ref 0.1–0.9)
MONO%: 1.1 % (ref 0.0–14.0)
NEUT#: 3.1 10*3/uL (ref 1.5–6.5)
NEUT%: 88.4 % — ABNORMAL HIGH (ref 38.4–76.8)
Platelets: 179 10*3/uL (ref 145–400)
RBC: 3.89 10*6/uL (ref 3.70–5.45)
RDW: 12.8 % (ref 11.2–14.5)
WBC: 3.5 10*3/uL — ABNORMAL LOW (ref 3.9–10.3)
lymph#: 0.4 10*3/uL — ABNORMAL LOW (ref 0.9–3.3)

## 2015-08-10 LAB — COMPREHENSIVE METABOLIC PANEL
ALT: 16 U/L (ref 0–55)
AST: 22 U/L (ref 5–34)
Albumin: 3.8 g/dL (ref 3.5–5.0)
Alkaline Phosphatase: 39 U/L — ABNORMAL LOW (ref 40–150)
Anion Gap: 9 mEq/L (ref 3–11)
BUN: 23.8 mg/dL (ref 7.0–26.0)
CO2: 25 mEq/L (ref 22–29)
Calcium: 9.8 mg/dL (ref 8.4–10.4)
Chloride: 107 mEq/L (ref 98–109)
Creatinine: 0.8 mg/dL (ref 0.6–1.1)
EGFR: 74 mL/min/{1.73_m2} — ABNORMAL LOW (ref >90)
Glucose: 137 mg/dl (ref 70–140)
Potassium: 4.3 mEq/L (ref 3.5–5.1)
Sodium: 141 mEq/L (ref 136–145)
Total Bilirubin: 0.47 mg/dL (ref 0.20–1.20)
Total Protein: 7.5 g/dL (ref 6.4–8.3)

## 2015-08-10 MED ORDER — SODIUM CHLORIDE 0.9 % IV SOLN
80.0000 mg/m2 | Freq: Once | INTRAVENOUS | Status: AC
  Administered 2015-08-10: 114 mg via INTRAVENOUS
  Filled 2015-08-10: qty 19

## 2015-08-10 MED ORDER — SODIUM CHLORIDE 0.9 % IV SOLN
Freq: Once | INTRAVENOUS | Status: DC
  Filled 2015-08-10: qty 4

## 2015-08-10 MED ORDER — FAMOTIDINE IN NACL 20-0.9 MG/50ML-% IV SOLN
20.0000 mg | Freq: Once | INTRAVENOUS | Status: AC
  Administered 2015-08-10: 20 mg via INTRAVENOUS

## 2015-08-10 MED ORDER — DIPHENHYDRAMINE HCL 50 MG/ML IJ SOLN
INTRAMUSCULAR | Status: AC
  Filled 2015-08-10: qty 1

## 2015-08-10 MED ORDER — FAMOTIDINE IN NACL 20-0.9 MG/50ML-% IV SOLN
INTRAVENOUS | Status: AC
  Filled 2015-08-10: qty 50

## 2015-08-10 MED ORDER — SODIUM CHLORIDE 0.9 % IV SOLN
Freq: Once | INTRAVENOUS | Status: AC
  Administered 2015-08-10: 13:00:00 via INTRAVENOUS

## 2015-08-10 MED ORDER — SODIUM CHLORIDE 0.9 % IV SOLN: 20.0000 mg | Freq: Once | INTRAVENOUS | Status: DC

## 2015-08-10 MED ORDER — SODIUM CHLORIDE 0.9 % IV SOLN
Freq: Once | INTRAVENOUS | Status: AC
  Administered 2015-08-10: 14:00:00 via INTRAVENOUS
  Filled 2015-08-10: qty 4

## 2015-08-10 MED ORDER — DIPHENHYDRAMINE HCL 50 MG/ML IJ SOLN
12.5000 mg | Freq: Once | INTRAMUSCULAR | Status: AC
  Administered 2015-08-10: 12.5 mg via INTRAVENOUS

## 2015-08-10 NOTE — Progress Notes (Signed)
OFFICE PROGRESS NOTE   August 10, 2015   Physicians: P.Gehrig, J.Kinard, (T.Brackbill) J.Russo, S.Ganem  INTERVAL HISTORY:  Patient is seen, alone for visit, in continuing attention to progressive metastatic endometrial carcinoma, having begun dose dense carbo taxol on 08-03-15 and due day 8 cycle 1 today.  Patient reports that she did much better with the first dose dense treatment than with previous q 3 week taxol dosing, with no aching and appetite still ok. She wanted to walk at park on day 5, became lightheaded, BP after return home only 76/40. She has held tenormin and amlodipine and pushed fluids since then, with improvement in symptoms and in BP. She has not been tachycardic off the beta blocker.  Carbo skin test prior to that chemo nonreactive. Bowels have moved. She has no change in chronic mild back discomfort. Taste mostly ok. No increased SOB or chest pain. Peripheral IV access easily accomplished. No LE swellng. No abdominal or pelvic discomfort. No bleeding. Bladder ok. No fever or symptoms of infection. No peripheral neuropathy. Remainder of 10 point Review of Systems negative.    No central catheter Flu vaccine 01-26-15  Daughter's surgery is 08-24-15, plan no chemo that week for patient.  ONCOLOGIC HISTORY BREAST CANCER: microinvasive T1N0 right breast cancer which was ER/PR positive and HER-2 2+ with insufficient material for FISH at diagnosis in 03/1999, treated with lumpectomy with 2 sentinel node evaluation, local radiation and 5 years of Tamoxifen thru 05/2004, then aromatase inhibitor 07/2004 thru 04/2006.   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 She had 6 cycles of taxol/carboplatin through Jan 2010, and vaginal cuff brachytherapy. She did well until some vague LUQ symptoms in Nov. 2012, with CT AP in [facility] system Mar 22, 2011  showing left pelvic sidewall involvement. She had laparoscopic evaluation at [facility] by [place] 04-25-2011, with finding of dense fibrosis in the area as well as mass encasing obturator nerve and vein as well as internal iliac vasculature; left ureterolysis was performed. No pathology was submitted from that procedure. She received 3 additional cycles of taxol/ carboplatin from 05-16-2011 thru 06-28-2011. CT AP on 07-17-2011 showed improvement in the left pelvic sidewall mass now 1.9 x 2 cm and 3.4 cm cephalocaudad, as compared with 2.5 x 2.2 cm and 5.4 cm on CT 03-22-11, and no progressive disease elsewhere. She saw Dr.Gehrig after the CT, recommendation for an additonal 3 cycles of same chemotherapy . Cycle 4 was taxol + carbo and neulasta, cycle 5 taxol only due to counts and cycle 6 taxol + carboplatin on 09-17-11. Follow up CT in May 2013 showed enlargement of the left pelvic sidewall involvement. She had IMRT 50 GY in 28 fractions by Dr Sondra Come, that completed 12-05-11. She had regular follow up scans, Including CT AP 01-2014 and PET 02-2013, which did not show progressive disease. She has scoliosis and some chronic intermittent low back discomfort, but developed different, persistent discomfort low back and radiating to LLE by ~ Dec into Jan 2016. MRI lumbar spine at [facility] on 06-13-2014 showed known scoliosis and degenerative disc disease, with new finding of 2.7 x 2.4 x 1.9 cm soft tissue mass at L3-4 involving left neural foramen, with focal cortical destruction of adjacent L3 vertebral body and portion of left L4 pedicle. She had CT biopsy on 06-21-14, with pathology ([id number]) poorly differentiated carcinoma consistent with high grade serous. Bone scan 06-22-14 had uptake  posteriorly in upper lumbar spine and a subtle increased area of uptake right greater trochanter of unclear significance. IMRT by Dr Sondra Come 2-29 thru 08-12-14. Zometa x4  07-07-14 thru 10-2014. Progression at L1-2 and possible RLL lung  nodule found on PET 04-2015 and CT CAP 06-2015. She began dose dense carbo taxol on 08-03-15. .  Objective:  Vital signs in last 24 hours:  BP 160/86 mmHg  Pulse 83  Temp(Src) 98 F (36.7 C) (Oral)  Resp 18  Ht 4' 11" (1.499 m)  Wt 110 lb 12.8 oz (50.259 kg)  BMI 22.37 kg/m2  SpO2 100% Weight stable. Alert, oriented and appropriate. Ambulatory without difficulty. Respirations not labored. Very pleasant as always.  No alopecia  HEENT:PERRL, sclerae not icteric. Oral mucosa moist without lesions, posterior pharynx clear.  Neck supple. No JVD.  Lymphatics:no supraclavicular adenopathy Resp: clear to auscultation bilaterally  Cardio: regular rate and rhythm. No gallop. GI: soft, nontender, not distended, no mass or organomegaly. Normally active bowel sounds. Surgical incision not remarkable. Musculoskeletal/ Extremities: UE, LE without pitting edema, cords, tenderness. No increased tenderness spine. Scoliosis  Neuro: no significant peripheral neuropathy. Otherwise nonfocal Skin without rash, ecchymosis, petechiae   Lab Results:  Results for orders placed or performed in visit on 08/10/15  CBC with Differential  Result Value Ref Range   WBC 3.5 (L) 3.9 - 10.3 10e3/uL   NEUT# 3.1 1.5 - 6.5 10e3/uL   HGB 12.5 11.6 - 15.9 g/dL   HCT 37.9 34.8 - 46.6 %   Platelets 179 145 - 400 10e3/uL   MCV 97.4 79.5 - 101.0 fL   MCH 32.1 25.1 - 34.0 pg   MCHC 33.0 31.5 - 36.0 g/dL   RBC 3.89 3.70 - 5.45 10e6/uL   RDW 12.8 11.2 - 14.5 %   lymph# 0.4 (L) 0.9 - 3.3 10e3/uL   MONO# 0.0 (L) 0.1 - 0.9 10e3/uL   Eosinophils Absolute 0.0 0.0 - 0.5 10e3/uL   Basophils Absolute 0.0 0.0 - 0.1 10e3/uL   NEUT% 88.4 (H) 38.4 - 76.8 %   LYMPH% 10.2 (L) 14.0 - 49.7 %   MONO% 1.1 0.0 - 14.0 %   EOS% 0.0 0.0 - 7.0 %   BASO% 0.3 0.0 - 2.0 %  Comprehensive metabolic panel  Result Value Ref Range   Sodium 141 136 - 145 mEq/L   Potassium 4.3 3.5 - 5.1 mEq/L   Chloride 107 98 - 109 mEq/L   CO2 25 22 - 29  mEq/L   Glucose 137 70 - 140 mg/dl   BUN 23.8 7.0 - 26.0 mg/dL   Creatinine 0.8 0.6 - 1.1 mg/dL   Total Bilirubin 0.47 0.20 - 1.20 mg/dL   Alkaline Phosphatase 39 (L) 40 - 150 U/L   AST 22 5 - 34 U/L   ALT 16 0 - 55 U/L   Total Protein 7.5 6.4 - 8.3 g/dL   Albumin 3.8 3.5 - 5.0 g/dL   Calcium 9.8 8.4 - 10.4 mg/dL   Anion Gap 9 3 - 11 mEq/L   EGFR 74 (L) >90 ml/min/1.73 m2     Studies/Results:  No results found.  Medications: I have reviewed the patient's current medications. Oral decadron is 20 mg five hrs prior to taxol. Agree with holding atenolol and amlodipine depending on close monitoring of BP. IV benadryl is 12.5 mg, needs to be given slowly Did not use EMEND day 1, no problems with nausea thus far Granix is available if needed   DISCUSSION Patient is  relieved that the lower dose taxol was easier to tolerate than prior regimen, and is in agreement with continuing with day 8 cycle 1 today. Will skip week of 4-13 with daughter's hysterectomy, then can treat either continuously or do skip week after each 3 week cycle depending on tolerance and other situation.  Assessment/Plan:  1.Papillary serous endometrial carcinoma IB at diagnosis Aug 2009, recurrent at left pelvic sidewall 03-2011 and progression in L1-2 in spring 2016, now further progression in same lumbar area (+ slighly enlarged RLL pulmonary nodule which dates back several years) . Day 1 cycle 1 dose dense carbo taxol on 08-03-15, with carbo skin test and premedications as noted, tolerated reasonably well other than hypotension. I will see her back closely, coordinating with treatments. 2.T1N0 right breast cancer 03-1999: adjuvant therapy included 5 years of tamoxifen. Not recurrent. Up to date on mammograms as of 10-18-14, tomo medically appropriate due to density of breast tissue 3.scoliosis of lumbar spine with degenerative disc disease and chronic symptoms related to this. 4.HTN followed by Dr Virgina Jock. She had  difficulty with prior chemo with lower BP and tachycardia also, and hypotension day 5 cycle 1 as above now. Holding atenolol and amlodipine prn, pushing fluids.  5. flu vaccine done  All questions answered and patient is in agreement with plans as noted. Chemo orders confirmed. Time spent 25 min including >50% counseling and coordination of care. Cc Dr Virgina Jock, Dr Cammie Mcgee, MD   08/10/2015, 2:04 PM

## 2015-08-10 NOTE — Patient Instructions (Signed)
[facility] Discharge Instructions for Patients Receiving Chemotherapy  Today you received the following chemotherapy agents Taxol   To help prevent nausea and vomiting after your treatment, we encourage you to take your nausea medication as directed.   If you develop nausea and vomiting that is not controlled by your nausea medication, call the clinic.   BELOW ARE SYMPTOMS THAT SHOULD BE REPORTED IMMEDIATELY:  *FEVER GREATER THAN 100.5 F  *CHILLS WITH OR WITHOUT FEVER  NAUSEA AND VOMITING THAT IS NOT CONTROLLED WITH YOUR NAUSEA MEDICATION  *UNUSUAL SHORTNESS OF BREATH  *UNUSUAL BRUISING OR BLEEDING  TENDERNESS IN MOUTH AND THROAT WITH OR WITHOUT PRESENCE OF ULCERS  *URINARY PROBLEMS  *BOWEL PROBLEMS  UNUSUAL RASH Items with * indicate a potential emergency and should be followed up as soon as possible.  Feel free to call the clinic you have any questions or concerns. The clinic phone number is [phone].  Please show the CHEMO ALERT CARD at check-in to the Emergency Department and triage nurse.   

## 2015-08-10 NOTE — Telephone Encounter (Signed)
appt made and avs will print in treatment room

## 2015-08-12 DIAGNOSIS — I959 Hypotension, unspecified: Secondary | ICD-10-CM | POA: Insufficient documentation

## 2015-08-17 ENCOUNTER — Other Ambulatory Visit: Payer: Self-pay | Admitting: Oncology

## 2015-08-17 ENCOUNTER — Other Ambulatory Visit (HOSPITAL_BASED_OUTPATIENT_CLINIC_OR_DEPARTMENT_OTHER): Payer: Medicare Other

## 2015-08-17 ENCOUNTER — Ambulatory Visit (HOSPITAL_BASED_OUTPATIENT_CLINIC_OR_DEPARTMENT_OTHER): Payer: Medicare Other

## 2015-08-17 ENCOUNTER — Ambulatory Visit: Payer: Medicare Other

## 2015-08-17 VITALS — BP 165/71 | HR 69 | Temp 98.2°F | Resp 16

## 2015-08-17 DIAGNOSIS — C541 Malignant neoplasm of endometrium: Secondary | ICD-10-CM

## 2015-08-17 DIAGNOSIS — C7951 Secondary malignant neoplasm of bone: Secondary | ICD-10-CM

## 2015-08-17 DIAGNOSIS — D701 Agranulocytosis secondary to cancer chemotherapy: Secondary | ICD-10-CM | POA: Diagnosis present

## 2015-08-17 LAB — COMPREHENSIVE METABOLIC PANEL
ALT: 15 U/L (ref 0–55)
AST: 20 U/L (ref 5–34)
Albumin: 3.5 g/dL (ref 3.5–5.0)
Alkaline Phosphatase: 36 U/L — ABNORMAL LOW (ref 40–150)
Anion Gap: 9 mEq/L (ref 3–11)
BUN: 18.3 mg/dL (ref 7.0–26.0)
CO2: 24 mEq/L (ref 22–29)
Calcium: 9.6 mg/dL (ref 8.4–10.4)
Chloride: 105 mEq/L (ref 98–109)
Creatinine: 0.8 mg/dL (ref 0.6–1.1)
EGFR: 71 mL/min/{1.73_m2} — ABNORMAL LOW (ref >90)
Glucose: 167 mg/dl — ABNORMAL HIGH (ref 70–140)
Potassium: 3.9 mEq/L (ref 3.5–5.1)
Sodium: 138 mEq/L (ref 136–145)
Total Bilirubin: 0.44 mg/dL (ref 0.20–1.20)
Total Protein: 6.8 g/dL (ref 6.4–8.3)

## 2015-08-17 LAB — CBC WITH DIFFERENTIAL/PLATELET
BASO%: 0 % (ref 0.0–2.0)
Basophils Absolute: 0 10*3/uL (ref 0.0–0.1)
EOS%: 0 % (ref 0.0–7.0)
Eosinophils Absolute: 0 10*3/uL (ref 0.0–0.5)
HCT: 34.5 % — ABNORMAL LOW (ref 34.8–46.6)
HGB: 11.7 g/dL (ref 11.6–15.9)
LYMPH%: 37 % (ref 14.0–49.7)
MCH: 32.3 pg (ref 25.1–34.0)
MCHC: 33.9 g/dL (ref 31.5–36.0)
MCV: 95.3 fL (ref 79.5–101.0)
MONO#: 0 10*3/uL — ABNORMAL LOW (ref 0.1–0.9)
MONO%: 2.5 % (ref 0.0–14.0)
NEUT#: 0.5 10*3/uL — CL (ref 1.5–6.5)
NEUT%: 60.5 % (ref 38.4–76.8)
Platelets: 184 10*3/uL (ref 145–400)
RBC: 3.62 10*6/uL — ABNORMAL LOW (ref 3.70–5.45)
RDW: 12.7 % (ref 11.2–14.5)
WBC: 0.8 10*3/uL — CL (ref 3.9–10.3)
lymph#: 0.3 10*3/uL — ABNORMAL LOW (ref 0.9–3.3)
nRBC: 0 % (ref 0–0)

## 2015-08-17 MED ORDER — TBO-FILGRASTIM 300 MCG/0.5ML ~~LOC~~ SOSY
300.0000 ug | PREFILLED_SYRINGE | Freq: Once | SUBCUTANEOUS | Status: AC
  Administered 2015-08-17: 300 ug via SUBCUTANEOUS
  Filled 2015-08-17: qty 0.5

## 2015-08-17 NOTE — Patient Instructions (Signed)
Neutropenia Neutropenia is a condition that occurs when the level of a certain type of white blood cell (neutrophil) in your body becomes lower than normal. Neutrophils are made in the bone marrow and fight infections. These cells protect against bacteria and viruses. The fewer neutrophils you have, and the longer your body remains without them, the greater your risk of getting a severe infection becomes. CAUSES  The cause of neutropenia may be hard to determine. However, it is usually due to 3 main problems:   Decreased production of neutrophils. This may be due to:  Certain medicines such as chemotherapy.  Genetic problems.  Cancer.  Radiation treatments.  Vitamin deficiency.  Some pesticides.  Increased destruction of neutrophils. This may be due to:  Overwhelming infections.  Hemolytic anemia. This is when the body destroys its own blood cells.  Chemotherapy.  Neutrophils moving to areas of the body where they cannot fight infections. This may be due to:  Dialysis procedures.  Conditions where the spleen becomes enlarged. Neutrophils are held in the spleen and are not available to the rest of the body.  Overwhelming infections. The neutrophils are held in the area of the infection and are not available to the rest of the body. SYMPTOMS  There are no specific symptoms of neutropenia. The lack of neutrophils can result in an infection, and an infection can cause various problems. DIAGNOSIS  Diagnosis is made by a blood test. A complete blood count is performed. The normal level of neutrophils in human blood differs with age and race. Infants have lower counts than older children and adults. African Americans have lower counts than Caucasians or Asians. The average adult level is 1500 cells/mm3 of blood. Neutrophil counts are interpreted as follows:  Greater than 1000 cells/mm3 gives normal protection against infection.  500 to 1000 cells/mm3 gives an increased risk for  infection.  200 to 500 cells/mm3 is a greater risk for severe infection.  Lower than 200 cells/mm3 is a marked risk of infection. This may require hospitalization and treatment with antibiotic medicines. TREATMENT  Treatment depends on the underlying cause, severity, and presence of infections or symptoms. It also depends on your health. Your caregiver will discuss the treatment plan with you. Mild cases are often easily treated and have a good outcome. Preventative measures may also be started to limit your risk of infections. Treatment can include:  Taking antibiotics.  Stopping medicines that are known to cause neutropenia.  Correcting nutritional deficiencies by eating green vegetables to supply folic acid and taking vitamin B supplements.  Stopping exposure to pesticides if your neutropenia is related to pesticide exposure.  Taking a blood growth factor called sargramostim, pegfilgrastim, or filgrastim if you are undergoing chemotherapy for cancer. This stimulates white blood cell production.  Removal of the spleen if you have Felty's syndrome and have repeated infections. HOME CARE INSTRUCTIONS   Follow your caregiver's instructions about when you need to have blood work done.  Wash your hands often. Make sure others who come in contact with you also wash their hands.  Wash raw fruits and vegetables before eating them. They can carry bacteria and fungi.  Avoid people with colds or spreadable (contagious) diseases (chickenpox, herpes zoster, influenza).  Avoid large crowds.  Avoid construction areas. The dust can release fungus into the air.  Be cautious around children in daycare or school environments.  Take care of your respiratory system by coughing and deep breathing.  Bathe daily.  Protect your skin from cuts and   burns.  Do not work in the garden or with flowers and plants.  Care for the mouth before and after meals by brushing with a soft toothbrush. If you have  mucositis, do not use mouthwash. Mouthwash contains alcohol and can dry out the mouth even more.  Clean the area between the genitals and the anus (perineal area) after urination and bowel movements. Women need to wipe from front to back.  Use a water soluble lubricant during sexual intercourse and practice good hygiene after. Do not have intercourse if you are severely neutropenic. Check with your caregiver for guidelines.  Exercise daily as tolerated.  Avoid people who were vaccinated with a live vaccine in the past 30 days. You should not receive live vaccines (polio, typhoid).  Do not provide direct care for pets. Avoid animal droppings. Do not clean litter boxes and bird cages.  Do not share food utensils.  Do not use tampons, enemas, or rectal suppositories unless directed by your caregiver.  Use an electric razor to remove hair.  Wash your hands after handling magazines, letters, and newspapers. SEEK IMMEDIATE MEDICAL CARE IF:   You have a fever.  You have chills or start to shake.  You feel nauseous or vomit.  You develop mouth sores.  You develop aches and pains.  You have redness and swelling around open wounds.  Your skin is warm to the touch.  You have pus coming from your wounds.  You develop swollen lymph nodes.  You feel weak or fatigued.  You develop red streaks on the skin. MAKE SURE YOU:  Understand these instructions.  Will watch your condition.  Will get help right away if you are not doing well or get worse.   This information is not intended to replace advice given to you by your health care provider. Make sure you discuss any questions you have with your health care provider.   Document Released: 10/19/2001 Document Revised: 07/22/2011 Document Reviewed: 11/09/2014 Elsevier Interactive Patient Education [employer].

## 2015-08-17 NOTE — Progress Notes (Signed)
Reviewed labs. Notified Dr. Marko Plume. Advised to hold chemo today. Additional orders received. Patient educated on neutropenic precautions. Gave masks to patient and instructed her to use one while in public areas. Verbalized understanding.

## 2015-08-18 ENCOUNTER — Ambulatory Visit (HOSPITAL_BASED_OUTPATIENT_CLINIC_OR_DEPARTMENT_OTHER): Payer: Medicare Other

## 2015-08-18 VITALS — BP 143/67 | HR 57 | Temp 98.5°F

## 2015-08-18 DIAGNOSIS — D701 Agranulocytosis secondary to cancer chemotherapy: Secondary | ICD-10-CM

## 2015-08-18 DIAGNOSIS — C7951 Secondary malignant neoplasm of bone: Secondary | ICD-10-CM

## 2015-08-18 MED ORDER — TBO-FILGRASTIM 300 MCG/0.5ML ~~LOC~~ SOSY
300.0000 ug | PREFILLED_SYRINGE | Freq: Once | SUBCUTANEOUS | Status: AC
  Administered 2015-08-18: 300 ug via SUBCUTANEOUS
  Filled 2015-08-18: qty 0.5

## 2015-08-18 NOTE — Patient Instructions (Signed)

## 2015-08-19 ENCOUNTER — Ambulatory Visit (HOSPITAL_BASED_OUTPATIENT_CLINIC_OR_DEPARTMENT_OTHER): Payer: Medicare Other

## 2015-08-19 ENCOUNTER — Telehealth: Payer: Self-pay | Admitting: *Deleted

## 2015-08-19 VITALS — BP 163/73 | HR 73 | Temp 98.3°F | Resp 18

## 2015-08-19 DIAGNOSIS — C7951 Secondary malignant neoplasm of bone: Secondary | ICD-10-CM | POA: Diagnosis present

## 2015-08-19 DIAGNOSIS — D701 Agranulocytosis secondary to cancer chemotherapy: Secondary | ICD-10-CM

## 2015-08-19 MED ORDER — TBO-FILGRASTIM 300 MCG/0.5ML ~~LOC~~ SOSY
300.0000 ug | PREFILLED_SYRINGE | Freq: Once | SUBCUTANEOUS | Status: AC
  Administered 2015-08-19: 300 ug via SUBCUTANEOUS

## 2015-08-19 NOTE — Telephone Encounter (Signed)
Pt came in for granix injection day 3.   [name] states she woke up early am with " severe pain in all of my bones - so bad I almost went to the ER "  Pt took 800 mg ibuprofen with benefit- but stated concern regarding getting shot today.  This RN discussed above discomfort probably related to injection and how it works in the marrow of the bone. Pt then remembered with previous treatment ( years ago ) same symptoms.  This RN reviewed noted severe neutropenia and MD recommendation- pt states " I do not want to not do what I need to for my health- as long as I know why I am having this pain "  [name] has ibuprofen in home as well as percocet - this RN discussed use of meds for symptoms.  Pt and husband appreciated discussion and is aware that her concerns will be forwarded to Dr Marko Plume for her review.

## 2015-08-20 ENCOUNTER — Other Ambulatory Visit: Payer: Self-pay | Admitting: Oncology

## 2015-08-21 ENCOUNTER — Encounter: Payer: Self-pay | Admitting: Oncology

## 2015-08-21 ENCOUNTER — Telehealth: Payer: Self-pay | Admitting: Oncology

## 2015-08-21 ENCOUNTER — Ambulatory Visit (HOSPITAL_BASED_OUTPATIENT_CLINIC_OR_DEPARTMENT_OTHER): Payer: Medicare Other | Admitting: Oncology

## 2015-08-21 ENCOUNTER — Other Ambulatory Visit (HOSPITAL_BASED_OUTPATIENT_CLINIC_OR_DEPARTMENT_OTHER): Payer: Medicare Other

## 2015-08-21 VITALS — BP 169/70 | HR 60 | Temp 98.0°F | Resp 17 | Ht 59.0 in | Wt 109.6 lb

## 2015-08-21 DIAGNOSIS — D701 Agranulocytosis secondary to cancer chemotherapy: Secondary | ICD-10-CM | POA: Diagnosis not present

## 2015-08-21 DIAGNOSIS — Z853 Personal history of malignant neoplasm of breast: Secondary | ICD-10-CM | POA: Diagnosis not present

## 2015-08-21 DIAGNOSIS — C541 Malignant neoplasm of endometrium: Secondary | ICD-10-CM | POA: Diagnosis not present

## 2015-08-21 DIAGNOSIS — I959 Hypotension, unspecified: Secondary | ICD-10-CM | POA: Diagnosis not present

## 2015-08-21 DIAGNOSIS — T451X5A Adverse effect of antineoplastic and immunosuppressive drugs, initial encounter: Secondary | ICD-10-CM

## 2015-08-21 LAB — CBC WITH DIFFERENTIAL/PLATELET
BASO%: 1.2 % (ref 0.0–2.0)
Basophils Absolute: 0.1 10*3/uL (ref 0.0–0.1)
EOS%: 0.3 % (ref 0.0–7.0)
Eosinophils Absolute: 0 10*3/uL (ref 0.0–0.5)
HCT: 33.7 % — ABNORMAL LOW (ref 34.8–46.6)
HGB: 11.3 g/dL — ABNORMAL LOW (ref 11.6–15.9)
LYMPH%: 11.1 % — ABNORMAL LOW (ref 14.0–49.7)
MCH: 32.2 pg (ref 25.1–34.0)
MCHC: 33.5 g/dL (ref 31.5–36.0)
MCV: 96 fL (ref 79.5–101.0)
MONO#: 3.5 10*3/uL — ABNORMAL HIGH (ref 0.1–0.9)
MONO%: 29.1 % — ABNORMAL HIGH (ref 0.0–14.0)
NEUT#: 7 10*3/uL — ABNORMAL HIGH (ref 1.5–6.5)
NEUT%: 58.3 % (ref 38.4–76.8)
Platelets: 129 10*3/uL — ABNORMAL LOW (ref 145–400)
RBC: 3.51 10*6/uL — ABNORMAL LOW (ref 3.70–5.45)
RDW: 13.5 % (ref 11.2–14.5)
WBC: 12 10*3/uL — ABNORMAL HIGH (ref 3.9–10.3)
lymph#: 1.3 10*3/uL (ref 0.9–3.3)
nRBC: 2 % — ABNORMAL HIGH (ref 0–0)

## 2015-08-21 LAB — COMPREHENSIVE METABOLIC PANEL
ALT: 17 U/L (ref 0–55)
AST: 25 U/L (ref 5–34)
Albumin: 3.3 g/dL — ABNORMAL LOW (ref 3.5–5.0)
Alkaline Phosphatase: 56 U/L (ref 40–150)
Anion Gap: 8 mEq/L (ref 3–11)
BUN: 18.8 mg/dL (ref 7.0–26.0)
CO2: 26 mEq/L (ref 22–29)
Calcium: 9.7 mg/dL (ref 8.4–10.4)
Chloride: 109 mEq/L (ref 98–109)
Creatinine: 0.8 mg/dL (ref 0.6–1.1)
EGFR: 68 mL/min/{1.73_m2} — ABNORMAL LOW (ref >90)
Glucose: 83 mg/dl (ref 70–140)
Potassium: 4 mEq/L (ref 3.5–5.1)
Sodium: 143 mEq/L (ref 136–145)
Total Bilirubin: 0.38 mg/dL (ref 0.20–1.20)
Total Protein: 6.4 g/dL (ref 6.4–8.3)

## 2015-08-21 MED ORDER — DEXAMETHASONE 4 MG PO TABS: ORAL_TABLET | ORAL | Status: DC

## 2015-08-21 NOTE — Telephone Encounter (Signed)
Added appt per LL 4/10 pof. Pt will updated sch at next visit

## 2015-08-21 NOTE — Progress Notes (Signed)
OFFICE PROGRESS NOTE   August 21, 2015   Physicians: P.Gehrig, J.Kinard, (T.Brackbill) J.Russo, S.Ganem  INTERVAL HISTORY:  Patient is seen, together with husband, in continuing attention to progressive metastatic endometrial carcinoma, now on dose dense carbo taxol since 08-03-15. Day 15 cycle 1 was held due to [facility] that day 0.5, with granix added. She will resume chemo with day 1 cycle 2 on 08-31-15, now to use granix days 2,3,9 and possibly 16.  Plan scans after 3 cycles.  Patient had granix on 4-6, 7,8 due to neutropenia on 4-6. She did not have fever or symptoms of infection. She did have aches by day 3 granix, did not try claritin but will use this with next injections.  Otherwise she has tolerated the dose dense regimen better from standpoint of side effects than past q 3 week regimen. She felt well enough to walk in park on 08-20-15 and is doing some activities at home. She does not notice any increase in chronic mild back discomfort, as she has had x years from scoliosis. She tries to push po fluids and follows BP with adjustment in antihypertensives, no further near syncopal episodes. Bowels are moving and appetite is adequate, no marked nausea. No LE swelling. No increased SOB. No hair loss as yet. Bladder ok. No problems at site of peripheral IV access. No increased peripheral neuropathy. Remainder of 10 point Review of Systems negative.    No central catheter Flu vaccine 01-26-15  Daughter's surgery is 08-24-15, plan no chemo that week for patient.  ONCOLOGIC HISTORY  BREAST CANCER: microinvasive T1N0 right breast cancer which was ER/PR positive and HER-2 2+ with insufficient material for FISH at diagnosis in 03/1999, treated with lumpectomy with 2 sentinel node evaluation, local radiation and 5 years of Tamoxifen thru 05/2004, then aromatase inhibitor 07/2004 thru 04/2006.   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 She had 6 cycles of taxol/carboplatin through Jan 2010, and vaginal cuff brachytherapy. She did well until some vague LUQ symptoms in Nov. 2012, with CT AP in [facility] Mar 22, 2011 showing left pelvic sidewall involvement. She had laparoscopic evaluation at [facility] by [place] 04-25-2011, with finding of dense fibrosis in the area as well as mass encasing obturator nerve and vein as well as internal iliac vasculature; left ureterolysis was performed. No pathology was submitted from that procedure. She received 3 additional cycles of taxol/ carboplatin from 05-16-2011 thru 06-28-2011. CT AP on 07-17-2011 showed improvement in the left pelvic sidewall mass now 1.9 x 2 cm and 3.4 cm cephalocaudad, as compared with 2.5 x 2.2 cm and 5.4 cm on CT 03-22-11, and no progressive disease elsewhere. She saw Dr.Gehrig after the CT, recommendation for an additonal 3 cycles of same chemotherapy . Cycle 4 was taxol + carbo and neulasta, cycle 5 taxol only due to counts and cycle 6 taxol + carboplatin on 09-17-11. Follow up CT in May 2013 showed enlargement of the left pelvic sidewall involvement. She had IMRT 50 GY in 28 fractions by Dr Sondra Come, that completed 12-05-11. She had regular follow up scans, Including CT AP 01-2014 and PET 02-2013, which did not show progressive disease. She has scoliosis and some chronic intermittent low back discomfort, but developed different, persistent discomfort low back and radiating to LLE by ~ Dec into Jan 2016. MRI lumbar spine at [facility] on 06-13-2014 showed known scoliosis and degenerative disc disease,  with new finding of 2.7 x 2.4 x 1.9 cm soft tissue mass at L3-4 involving left neural foramen, with focal cortical destruction of adjacent L3 vertebral body and portion of left L4 pedicle. She had CT biopsy on 06-21-14, with pathology ([id number]) poorly differentiated carcinoma consistent with high grade serous. Bone  scan 06-22-14 had uptake posteriorly in upper lumbar spine and a subtle increased area of uptake right greater trochanter of unclear significance. IMRT by Dr Sondra Come 2-29 thru 08-12-14. Zometa x4 07-07-14 thru 10-2014. Progression at L1-2 and possible RLL lung nodule found on PET 04-2015 and CT CAP 06-2015. She began dose dense carbo taxol on 08-03-15. She was neutropenic by day 15 cycle 1, ANC 0.5 and granix added. .  Objective:  Vital signs in last 24 hours:  BP 169/70 mmHg  Pulse 60  Temp(Src) 98 F (36.7 C) (Oral)  Resp 17  Ht _0  (1.499 m)  Wt 109 lb 9.6 oz (49.714 kg)  BMI 22.12 kg/m2  SpO2 100% Weight down 1 lb Alert, oriented and appropriate. Ambulatory without difficulty.  No alopecia  HEENT:PERRL, sclerae not icteric. Oral mucosa moist without lesions, posterior pharynx clear.  Neck supple. No JVD.  Lymphatics:nosupraclavicular adenopathy Resp: clear to auscultation bilaterally and normal percussion bilaterally Cardio: regular rate and rhythm. No gallop. GI: soft, nontender, not distended, no mass or organomegaly. Normally active bowel sounds. Surgical incision not remarkable. Musculoskeletal/ Extremities: back not tender, scoliosis. LE without pitting edema, cords, tenderness Neuro: no peripheral neuropathy. Otherwise nonfocal. PSYCH appropriate mood and affect Skin without rash, ecchymosis, petechiae  Lab Results:  Results for orders placed or performed in visit on 08/21/15  CBC with Differential  Result Value Ref Range   WBC 12.0 (H) 3.9 - 10.3 10e3/uL   NEUT# 7.0 (H) 1.5 - 6.5 10e3/uL   HGB 11.3 (L) 11.6 - 15.9 g/dL   HCT 33.7 (L) 34.8 - 46.6 %   Platelets 129 (L) 145 - 400 10e3/uL   MCV 96.0 79.5 - 101.0 fL   MCH 32.2 25.1 - 34.0 pg   MCHC 33.5 31.5 - 36.0 g/dL   RBC 3.51 (L) 3.70 - 5.45 10e6/uL   RDW 13.5 11.2 - 14.5 %   lymph# 1.3 0.9 - 3.3 10e3/uL   MONO# 3.5 (H) 0.1 - 0.9 10e3/uL   Eosinophils Absolute 0.0 0.0 - 0.5 10e3/uL   Basophils Absolute 0.1 0.0  - 0.1 10e3/uL   NEUT% 58.3 38.4 - 76.8 %   LYMPH% 11.1 (L) 14.0 - 49.7 %   MONO% 29.1 (H) 0.0 - 14.0 %   EOS% 0.3 0.0 - 7.0 %   BASO% 1.2 0.0 - 2.0 %   nRBC 2 (H) 0 - 0 %  Comprehensive metabolic panel  Result Value Ref Range   Sodium 143 136 - 145 mEq/L   Potassium 4.0 3.5 - 5.1 mEq/L   Chloride 109 98 - 109 mEq/L   CO2 26 22 - 29 mEq/L   Glucose 83 70 - 140 mg/dl   BUN 18.8 7.0 - 26.0 mg/dL   Creatinine 0.8 0.6 - 1.1 mg/dL   Total Bilirubin 0.38 0.20 - 1.20 mg/dL   Alkaline Phosphatase 56 40 - 150 U/L   AST 25 5 - 34 U/L   ALT 17 0 - 55 U/L   Total Protein 6.4 6.4 - 8.3 g/dL   Albumin 3.3 (L) 3.5 - 5.0 g/dL   Calcium 9.7 8.4 - 10.4 mg/dL   Anion Gap 8 3 - 11 mEq/L  EGFR 68 (L) >90 ml/min/1.73 m2     Studies/Results:  No results found.  Medications: I have reviewed the patient's current medications. Will try claritin for several days around granix injections. WIll try granix days 2,3,9 with next cycle, also day 16 if needed Claritin 10 mg daily for several days around granix injections if helpful for aches.  DISCUSSION Granix as above Would like to go to beach ~ week of 5-14 x 1-2 weeks, so likely will give day 1 cycle 3 on 5-11 then resume after trip  3 cycles then repeat scans  Assessment/Plan:  1.Papillary serous endometrial carcinoma IB at diagnosis Aug 2009, recurrent at left pelvic sidewall 03-2011 and progression in L1-2 in spring 2016, now further progression in same lumbar area (+ slighly enlarged RLL pulmonary nodule which dates back several years) . Day 1 cycle 1 dose dense carbo taxol on 08-03-15, with carbo skin test, some hypotension better with adjustment in antihypertensives, and neutropenic day 15. Will resume treatment with day 1 cycle 2 on 4-20 as long as ANC >=1.5 and plt >=100k, with granix as noted. Cycle 3 dates to adjust for planned trip, repeat scans after 3 cycles.  2.T1N0 right breast cancer 03-1999: adjuvant therapy included 5 years of  tamoxifen. Not recurrent. Up to date on mammograms as of 10-18-14, tomo medically appropriate due to density of breast tissue 3.scoliosis of lumbar spine with degenerative disc disease and chronic symptoms related to this. 4.HTN followed by Dr Virgina Jock. She had difficulty with prior chemo with lower BP and tachycardia also, and hypotension day 5 cycle 1 as above now. Holding atenolol and amlodipine prn, pushing fluids.  5. flu vaccine done 6.mild anemia multifactorial, not symptomatic. Follow 7.peripheral IV access ok thus far   All questions answered. Chemo orders adjusted, granix ordered. Time spent 25 min including >50% counseling and coordination of care. Route PCP  Gordy Levan, MD   08/21/2015, 11:35 AM

## 2015-08-22 ENCOUNTER — Encounter: Payer: Medicare Other | Admitting: Thoracic Surgery (Cardiothoracic Vascular Surgery)

## 2015-08-22 DIAGNOSIS — D701 Agranulocytosis secondary to cancer chemotherapy: Secondary | ICD-10-CM | POA: Insufficient documentation

## 2015-08-22 DIAGNOSIS — T451X5A Adverse effect of antineoplastic and immunosuppressive drugs, initial encounter: Secondary | ICD-10-CM

## 2015-08-31 ENCOUNTER — Other Ambulatory Visit: Payer: Self-pay | Admitting: Oncology

## 2015-08-31 ENCOUNTER — Ambulatory Visit (HOSPITAL_BASED_OUTPATIENT_CLINIC_OR_DEPARTMENT_OTHER): Payer: Medicare Other

## 2015-08-31 ENCOUNTER — Other Ambulatory Visit (HOSPITAL_BASED_OUTPATIENT_CLINIC_OR_DEPARTMENT_OTHER): Payer: Medicare Other

## 2015-08-31 VITALS — BP 145/72 | HR 68 | Temp 98.1°F

## 2015-08-31 DIAGNOSIS — C541 Malignant neoplasm of endometrium: Secondary | ICD-10-CM

## 2015-08-31 DIAGNOSIS — C549 Malignant neoplasm of corpus uteri, unspecified: Secondary | ICD-10-CM

## 2015-08-31 DIAGNOSIS — Z5111 Encounter for antineoplastic chemotherapy: Secondary | ICD-10-CM | POA: Diagnosis present

## 2015-08-31 LAB — CBC WITH DIFFERENTIAL/PLATELET
BASO%: 0.2 % (ref 0.0–2.0)
Basophils Absolute: 0 10*3/uL (ref 0.0–0.1)
EOS%: 0 % (ref 0.0–7.0)
Eosinophils Absolute: 0 10*3/uL (ref 0.0–0.5)
HCT: 40.5 % (ref 34.8–46.6)
HGB: 13 g/dL (ref 11.6–15.9)
LYMPH%: 6.6 % — ABNORMAL LOW (ref 14.0–49.7)
MCH: 31.9 pg (ref 25.1–34.0)
MCHC: 32.2 g/dL (ref 31.5–36.0)
MCV: 99 fL (ref 79.5–101.0)
MONO#: 0 10*3/uL — ABNORMAL LOW (ref 0.1–0.9)
MONO%: 0.6 % (ref 0.0–14.0)
NEUT#: 3.5 10*3/uL (ref 1.5–6.5)
NEUT%: 92.6 % — ABNORMAL HIGH (ref 38.4–76.8)
Platelets: 159 10*3/uL (ref 145–400)
RBC: 4.09 10*6/uL (ref 3.70–5.45)
RDW: 13.6 % (ref 11.2–14.5)
WBC: 3.8 10*3/uL — ABNORMAL LOW (ref 3.9–10.3)
lymph#: 0.2 10*3/uL — ABNORMAL LOW (ref 0.9–3.3)

## 2015-08-31 LAB — COMPREHENSIVE METABOLIC PANEL
ALT: 17 U/L (ref 0–55)
AST: 23 U/L (ref 5–34)
Albumin: 3.6 g/dL (ref 3.5–5.0)
Alkaline Phosphatase: 49 U/L (ref 40–150)
Anion Gap: 12 mEq/L — ABNORMAL HIGH (ref 3–11)
BUN: 22.5 mg/dL (ref 7.0–26.0)
CO2: 21 mEq/L — ABNORMAL LOW (ref 22–29)
Calcium: 9.8 mg/dL (ref 8.4–10.4)
Chloride: 108 mEq/L (ref 98–109)
Creatinine: 0.8 mg/dL (ref 0.6–1.1)
EGFR: 68 mL/min/{1.73_m2} — ABNORMAL LOW (ref >90)
Glucose: 201 mg/dl — ABNORMAL HIGH (ref 70–140)
Potassium: 3.8 mEq/L (ref 3.5–5.1)
Sodium: 141 mEq/L (ref 136–145)
Total Bilirubin: 0.86 mg/dL (ref 0.20–1.20)
Total Protein: 7.3 g/dL (ref 6.4–8.3)

## 2015-08-31 MED ORDER — SODIUM CHLORIDE 0.9 % IV SOLN: Freq: Once | INTRAVENOUS | Status: DC

## 2015-08-31 MED ORDER — FAMOTIDINE IN NACL 20-0.9 MG/50ML-% IV SOLN
20.0000 mg | Freq: Once | INTRAVENOUS | Status: AC
  Administered 2015-08-31: 20 mg via INTRAVENOUS

## 2015-08-31 MED ORDER — FAMOTIDINE IN NACL 20-0.9 MG/50ML-% IV SOLN
INTRAVENOUS | Status: AC
  Filled 2015-08-31: qty 50

## 2015-08-31 MED ORDER — DIPHENHYDRAMINE HCL 50 MG/ML IJ SOLN
12.5000 mg | Freq: Once | INTRAMUSCULAR | Status: AC
  Administered 2015-08-31: 12.5 mg via INTRAVENOUS

## 2015-08-31 MED ORDER — SODIUM CHLORIDE 0.9 % IV SOLN
Freq: Once | INTRAVENOUS | Status: DC
  Filled 2015-08-31: qty 4

## 2015-08-31 MED ORDER — CARBOPLATIN CHEMO INTRADERMAL TEST DOSE 100MCG/0.02ML
100.0000 ug | Freq: Once | INTRADERMAL | Status: AC
  Administered 2015-08-31: 100 ug via INTRADERMAL
  Filled 2015-08-31: qty 0.01

## 2015-08-31 MED ORDER — DIPHENHYDRAMINE HCL 50 MG/ML IJ SOLN
INTRAMUSCULAR | Status: AC
  Filled 2015-08-31: qty 1

## 2015-08-31 MED ORDER — SODIUM CHLORIDE 0.9 % IV SOLN
Freq: Once | INTRAVENOUS | Status: AC
  Administered 2015-08-31: 11:00:00 via INTRAVENOUS

## 2015-08-31 MED ORDER — SODIUM CHLORIDE 0.9 % IV SOLN
254.4000 mg | Freq: Once | INTRAVENOUS | Status: AC
  Administered 2015-08-31: 250 mg via INTRAVENOUS
  Filled 2015-08-31: qty 25

## 2015-08-31 MED ORDER — PACLITAXEL CHEMO INJECTION 300 MG/50ML
80.0000 mg/m2 | Freq: Once | INTRAVENOUS | Status: AC
  Administered 2015-08-31: 114 mg via INTRAVENOUS
  Filled 2015-08-31: qty 19

## 2015-08-31 MED ORDER — SODIUM CHLORIDE 0.9 % IV SOLN
Freq: Once | INTRAVENOUS | Status: AC
  Administered 2015-08-31: 12:00:00 via INTRAVENOUS
  Filled 2015-08-31: qty 8

## 2015-08-31 NOTE — Patient Instructions (Signed)
[facility] Discharge Instructions for Patients Receiving Chemotherapy  Today you received the following chemotherapy agents Taxol/Carboplatin To help prevent nausea and vomiting after your treatment, we encourage you to take your nausea medication as prescribed.   If you develop nausea and vomiting that is not controlled by your nausea medication, call the clinic.   BELOW ARE SYMPTOMS THAT SHOULD BE REPORTED IMMEDIATELY:  *FEVER GREATER THAN 100.5 F  *CHILLS WITH OR WITHOUT FEVER  NAUSEA AND VOMITING THAT IS NOT CONTROLLED WITH YOUR NAUSEA MEDICATION  *UNUSUAL SHORTNESS OF BREATH  *UNUSUAL BRUISING OR BLEEDING  TENDERNESS IN MOUTH AND THROAT WITH OR WITHOUT PRESENCE OF ULCERS  *URINARY PROBLEMS  *BOWEL PROBLEMS  UNUSUAL RASH Items with * indicate a potential emergency and should be followed up as soon as possible.  Feel free to call the clinic you have any questions or concerns. The clinic phone number is [phone].  Please show the CHEMO ALERT CARD at check-in to the Emergency Department and triage nurse.   

## 2015-09-01 ENCOUNTER — Ambulatory Visit (HOSPITAL_BASED_OUTPATIENT_CLINIC_OR_DEPARTMENT_OTHER): Payer: Medicare Other

## 2015-09-01 VITALS — BP 132/67 | HR 68 | Temp 98.6°F

## 2015-09-01 DIAGNOSIS — C7951 Secondary malignant neoplasm of bone: Secondary | ICD-10-CM | POA: Diagnosis present

## 2015-09-01 MED ORDER — TBO-FILGRASTIM 300 MCG/0.5ML ~~LOC~~ SOSY
300.0000 ug | PREFILLED_SYRINGE | Freq: Once | SUBCUTANEOUS | Status: AC
  Administered 2015-09-01: 300 ug via SUBCUTANEOUS
  Filled 2015-09-01: qty 0.5

## 2015-09-02 ENCOUNTER — Ambulatory Visit (HOSPITAL_BASED_OUTPATIENT_CLINIC_OR_DEPARTMENT_OTHER): Payer: Medicare Other

## 2015-09-02 VITALS — BP 138/62 | HR 63 | Temp 98.3°F | Resp 16

## 2015-09-02 DIAGNOSIS — C541 Malignant neoplasm of endometrium: Secondary | ICD-10-CM

## 2015-09-02 DIAGNOSIS — C7951 Secondary malignant neoplasm of bone: Secondary | ICD-10-CM

## 2015-09-02 DIAGNOSIS — D701 Agranulocytosis secondary to cancer chemotherapy: Secondary | ICD-10-CM

## 2015-09-02 MED ORDER — TBO-FILGRASTIM 300 MCG/0.5ML ~~LOC~~ SOSY
300.0000 ug | PREFILLED_SYRINGE | Freq: Once | SUBCUTANEOUS | Status: AC
  Administered 2015-09-02: 300 ug via SUBCUTANEOUS

## 2015-09-02 NOTE — Patient Instructions (Signed)

## 2015-09-07 ENCOUNTER — Other Ambulatory Visit: Payer: Self-pay | Admitting: Nurse Practitioner

## 2015-09-07 ENCOUNTER — Ambulatory Visit (HOSPITAL_BASED_OUTPATIENT_CLINIC_OR_DEPARTMENT_OTHER): Payer: Medicare Other | Admitting: Nurse Practitioner

## 2015-09-07 ENCOUNTER — Other Ambulatory Visit (HOSPITAL_BASED_OUTPATIENT_CLINIC_OR_DEPARTMENT_OTHER): Payer: Medicare Other

## 2015-09-07 ENCOUNTER — Ambulatory Visit (HOSPITAL_BASED_OUTPATIENT_CLINIC_OR_DEPARTMENT_OTHER): Payer: Medicare Other

## 2015-09-07 VITALS — BP 143/71 | HR 77 | Temp 97.9°F | Resp 16

## 2015-09-07 DIAGNOSIS — Z853 Personal history of malignant neoplasm of breast: Secondary | ICD-10-CM | POA: Diagnosis not present

## 2015-09-07 DIAGNOSIS — T8089XA Other complications following infusion, transfusion and therapeutic injection, initial encounter: Secondary | ICD-10-CM

## 2015-09-07 DIAGNOSIS — C541 Malignant neoplasm of endometrium: Secondary | ICD-10-CM | POA: Diagnosis not present

## 2015-09-07 DIAGNOSIS — Z5111 Encounter for antineoplastic chemotherapy: Secondary | ICD-10-CM

## 2015-09-07 DIAGNOSIS — C7951 Secondary malignant neoplasm of bone: Secondary | ICD-10-CM | POA: Diagnosis not present

## 2015-09-07 DIAGNOSIS — C549 Malignant neoplasm of corpus uteri, unspecified: Secondary | ICD-10-CM

## 2015-09-07 LAB — COMPREHENSIVE METABOLIC PANEL
ALT: 19 U/L (ref 0–55)
AST: 21 U/L (ref 5–34)
Albumin: 3.7 g/dL (ref 3.5–5.0)
Alkaline Phosphatase: 48 U/L (ref 40–150)
Anion Gap: 12 mEq/L — ABNORMAL HIGH (ref 3–11)
BUN: 23.7 mg/dL (ref 7.0–26.0)
CO2: 21 mEq/L — ABNORMAL LOW (ref 22–29)
Calcium: 9.9 mg/dL (ref 8.4–10.4)
Chloride: 107 mEq/L (ref 98–109)
Creatinine: 0.8 mg/dL (ref 0.6–1.1)
EGFR: 71 mL/min/{1.73_m2} — ABNORMAL LOW (ref >90)
Glucose: 200 mg/dl — ABNORMAL HIGH (ref 70–140)
Potassium: 4 mEq/L (ref 3.5–5.1)
Sodium: 140 mEq/L (ref 136–145)
Total Bilirubin: 0.65 mg/dL (ref 0.20–1.20)
Total Protein: 7 g/dL (ref 6.4–8.3)

## 2015-09-07 LAB — CBC WITH DIFFERENTIAL/PLATELET
BASO%: 0.9 % (ref 0.0–2.0)
Basophils Absolute: 0 10*3/uL (ref 0.0–0.1)
EOS%: 0 % (ref 0.0–7.0)
Eosinophils Absolute: 0 10*3/uL (ref 0.0–0.5)
HCT: 36.3 % (ref 34.8–46.6)
HGB: 11.9 g/dL (ref 11.6–15.9)
LYMPH%: 17.4 % (ref 14.0–49.7)
MCH: 32.4 pg (ref 25.1–34.0)
MCHC: 32.8 g/dL (ref 31.5–36.0)
MCV: 98.7 fL (ref 79.5–101.0)
MONO#: 0 10*3/uL — ABNORMAL LOW (ref 0.1–0.9)
MONO%: 1.8 % (ref 0.0–14.0)
NEUT#: 1.8 10*3/uL (ref 1.5–6.5)
NEUT%: 79.9 % — ABNORMAL HIGH (ref 38.4–76.8)
Platelets: 220 10*3/uL (ref 145–400)
RBC: 3.68 10*6/uL — ABNORMAL LOW (ref 3.70–5.45)
RDW: 13.9 % (ref 11.2–14.5)
WBC: 2.2 10*3/uL — ABNORMAL LOW (ref 3.9–10.3)
lymph#: 0.4 10*3/uL — ABNORMAL LOW (ref 0.9–3.3)

## 2015-09-07 MED ORDER — DIPHENHYDRAMINE HCL 50 MG/ML IJ SOLN
INTRAMUSCULAR | Status: AC
  Filled 2015-09-07: qty 1

## 2015-09-07 MED ORDER — FAMOTIDINE IN NACL 20-0.9 MG/50ML-% IV SOLN
20.0000 mg | Freq: Once | INTRAVENOUS | Status: AC
  Administered 2015-09-07: 20 mg via INTRAVENOUS

## 2015-09-07 MED ORDER — FAMOTIDINE IN NACL 20-0.9 MG/50ML-% IV SOLN
INTRAVENOUS | Status: AC
  Filled 2015-09-07: qty 50

## 2015-09-07 MED ORDER — SODIUM CHLORIDE 0.9 % IV SOLN
Freq: Once | INTRAVENOUS | Status: AC
  Administered 2015-09-07: 11:00:00 via INTRAVENOUS

## 2015-09-07 MED ORDER — SODIUM CHLORIDE 0.9 % IV SOLN
80.0000 mg/m2 | Freq: Once | INTRAVENOUS | Status: AC
  Administered 2015-09-07: 114 mg via INTRAVENOUS
  Filled 2015-09-07: qty 19

## 2015-09-07 MED ORDER — DIPHENHYDRAMINE HCL 50 MG/ML IJ SOLN
12.5000 mg | Freq: Once | INTRAMUSCULAR | Status: AC
  Administered 2015-09-07: 12.5 mg via INTRAVENOUS

## 2015-09-07 MED ORDER — SODIUM CHLORIDE 0.9 % IV SOLN
Freq: Once | INTRAVENOUS | Status: AC
  Administered 2015-09-07: 11:00:00 via INTRAVENOUS
  Filled 2015-09-07: qty 4

## 2015-09-07 NOTE — Progress Notes (Signed)
Peripheral IV site;  Multiple IV sticks by 3 different RNs for treatment today.   P.I.V. Left Wrist was obtained on 5 th attempt.  Pt reported she was also stuck 3 times in lab today for her blood work before treament.  Taxol infusion 3/4 complete when pt reported swelling and stinging at IV site.   Arm immediately above IV was slightly swollen and pink.   Infusion stopped and Kelly Lesser NP notified.  PIV removed, Arm elevated and ice applied to site.   Another PIV on right AC obtained by RN on second attempt.  Taxol infusion completed and pt d/c'd home in stable condition.  Swelling and pinkness to left wrist resolved.  No observable swelling or discoloration noted.  Pt denied any pain or stinging. Per Protocol pt is scheduled to return in 24hrs, 48 hrs and one week to assess Extravasation site.  Discussed Port a Cath for IV access.  Pt understands she has "poor veins" for IV access and understands Nursing recommends Port a Cath for pt to have safer and more efficient treatment with less discomfort from multiple sticks.  Pt says she will think about it and may discuss w/ Dr. Marko Sandoval on her next visit.  Per Protocol pt is scheduled to return in 24hrs, 48 hrs and one week to assess Extravasation site.

## 2015-09-07 NOTE — Patient Instructions (Signed)
[facility] Discharge Instructions for Patients Receiving Chemotherapy  Today you received the following chemotherapy agents Taxol   To help prevent nausea and vomiting after your treatment, we encourage you to take your nausea medication as directed.   If you develop nausea and vomiting that is not controlled by your nausea medication, call the clinic.   BELOW ARE SYMPTOMS THAT SHOULD BE REPORTED IMMEDIATELY:  *FEVER GREATER THAN 100.5 F  *CHILLS WITH OR WITHOUT FEVER  NAUSEA AND VOMITING THAT IS NOT CONTROLLED WITH YOUR NAUSEA MEDICATION  *UNUSUAL SHORTNESS OF BREATH  *UNUSUAL BRUISING OR BLEEDING  TENDERNESS IN MOUTH AND THROAT WITH OR WITHOUT PRESENCE OF ULCERS  *URINARY PROBLEMS  *BOWEL PROBLEMS  UNUSUAL RASH Items with * indicate a potential emergency and should be followed up as soon as possible.  Feel free to call the clinic you have any questions or concerns. The clinic phone number is [phone].  Please show the CHEMO ALERT CARD at check-in to the Emergency Department and triage nurse.   

## 2015-09-08 ENCOUNTER — Other Ambulatory Visit: Payer: Self-pay | Admitting: Oncology

## 2015-09-08 ENCOUNTER — Ambulatory Visit (HOSPITAL_BASED_OUTPATIENT_CLINIC_OR_DEPARTMENT_OTHER): Payer: Medicare Other

## 2015-09-08 VITALS — BP 137/71 | HR 73 | Temp 98.5°F

## 2015-09-08 DIAGNOSIS — C7951 Secondary malignant neoplasm of bone: Secondary | ICD-10-CM

## 2015-09-08 DIAGNOSIS — D701 Agranulocytosis secondary to cancer chemotherapy: Secondary | ICD-10-CM | POA: Diagnosis present

## 2015-09-08 MED ORDER — TBO-FILGRASTIM 300 MCG/0.5ML ~~LOC~~ SOSY
300.0000 ug | PREFILLED_SYRINGE | Freq: Once | SUBCUTANEOUS | Status: AC
  Administered 2015-09-08: 300 ug via SUBCUTANEOUS
  Filled 2015-09-08: qty 0.5

## 2015-09-08 NOTE — Progress Notes (Signed)
[name] in for granix  Injection today.  Yesterday while receiving chemotherapy her peripheral IV infiltrated.  She is here for a check of the site.  She states that she isn't having any discomfort at the site, does have slight swelling at the site, no redness at the site just a small bruise.  She stated that it felt better after the ice was put on her arm.  Will come in tomorrow as scheduled for a check.

## 2015-09-09 ENCOUNTER — Inpatient Hospital Stay: Payer: Medicare Other

## 2015-09-09 ENCOUNTER — Other Ambulatory Visit: Payer: Self-pay | Admitting: Oncology

## 2015-09-09 ENCOUNTER — Encounter: Payer: Self-pay | Admitting: Nurse Practitioner

## 2015-09-09 DIAGNOSIS — C541 Malignant neoplasm of endometrium: Secondary | ICD-10-CM

## 2015-09-09 DIAGNOSIS — IMO0002 Reserved for concepts with insufficient information to code with codable children: Secondary | ICD-10-CM | POA: Insufficient documentation

## 2015-09-09 NOTE — Progress Notes (Signed)
SYMPTOM MANAGEMENT CLINIC    Chief Complaint: Extravasation  HPI:  [name] 75 y.o. female diagnosed with endometrial cancer with bone metastasis.  Currently undergoing carboplatin/Taxol chemotherapy regimen. Patient presented to the [facility] today to receive cycle 2, day 8 of the Taxol only portion of her carboplatin/Taxol chemotherapy regimen.  Patient did experience a mild extravasation of the Taxol chemotherapy infusion to her left radial IV site.  Patient has a small amount of edema, mild erythema, but no tenderness to the left radial IV Cipro the chemotherapy was infusing.  Infusion was immediately held; and IV catheter was removed.  Cold compresses were placed to the site and the arm was elevated.    Peripheral IV was initiated to the right upper extremity; and patient was able to complete her chemotherapy today without any further issues.  Note: Patient is a very hard IV stick; and took a total of 6-8 IV sticks for patient to receive her chemotherapy today.  The cancer Center nurse reviewed the option of obtaining a Port-A-Cath for future use-patient stated that she will think about it.  Patient has been scheduled for follow-up per extravasation protocol.  Patient also knows to call/return to go directly to the emergency department for any worsening symptoms whatsoever.   Patient is scheduled to return tomorrow, 08/31/2015 for a Granix injection. She will also receive a Granix injection on Saturday 4/29. She will return 09/14/15 for labs, visit, and chemo.      Malignant neoplasm of corpus uteri, except isthmus ([place])   12/22/2007 Surgery Staging for IA UPSC    - 05/28/2008 Chemotherapy 6 cycles of paclitaxel and carboplatin with vaginal cuff brachytherapy   03/22/2011 Relapse/Recurrence Left pelvic node recurrence   04/25/2011 Surgery L/S evaluation, not resectable.   05/16/2011 - 09/25/2011 Chemotherapy 6 cyclces paclitaxel and carboplatin    - 12/27/2011 Radiation  Therapy radiation completed   03/10/2013 Progression progression left pelvic mass   04/02/2013 Surgery Resection and IORT   06/13/2014 Relapse/Recurrence soft tissue mass at L3-4   07/11/2014 - 08/12/2014 Radiation Therapy IMRT + zometa    Endometrial cancer ([place]) (Resolved)   12/01/2007 Initial Diagnosis Endometrial cancer   12/15/2007 Surgery    01/22/2008 -  Chemotherapy 6 cycles paclitaxol and carboplatin    Radiation Therapy HDR   03/31/2011 Relapse/Recurrence chemotherapy and external beam radiation   05/16/2011 - 09/23/2011 Chemotherapy    10/28/2011 - 12/05/2011 Radiation Therapy radiation for pelvic sidewall recurrence   03/10/2013 Progression    04/02/2013 Surgery pelvic mass resection with IORT   06/13/2014 Relapse/Recurrence + recurrence paraspinous muscle   07/11/2014 - 08/12/2014 Radiation Therapy    05/02/2015 Progression increase size of paraspinal mass. In the prior radiation field.    Metastatic cancer to bone ([place])   06/27/2014 Initial Diagnosis Metastatic cancer to bone    ROS  Past Medical History  Diagnosis Date  . Hypertension   . Palpitations   . History of breast cancer 2000    right breast -- treated with lumpectomy and radiation therarpy, postoperatively with tamoxifen   . Hyperlipidemia   . Hypercholesterolemia   . PVC's (premature ventricular contractions)   . S/P radiation therapy 10/28/11-12/05/11    5040 cGy left pelvis  . S/P radiation therapy     Intracavitary brachytherapy of uterus  . Radiation 07/11/14-08/12/14    left lumbar paraspinal area 55 gray  . Endometrial cancer ([place]) 12/2007    s/p total abdominal hysterectomy at [facility] - ovaries were also removed  Past Surgical History  Procedure Laterality Date  . Total abdominal hysterectomy w/ bilateral salpingoophorectomy    . Breast lumpectomy    . Appendectomy    . Tonsillectomy    . 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    is allergic to ace inhibitors; codeine; demerol; and dilaudid.    Medication List       This list is accurate as of: 09/07/15 11:59 PM.  Always use your most recent med list.               ALPRAZolam 0.25 MG tablet  Commonly known as:  XANAX  Take 0.25 mg by mouth 2 (two) times daily as needed for anxiety. Reported on 07/19/2015     amLODipine 5 MG tablet  Commonly known as:  NORVASC  Take 5 mg by mouth daily.     aspirin EC 81 MG tablet  Take 81 mg by mouth daily.     atenolol 25 MG tablet  Commonly known as:  TENORMIN  Take 25 mg by mouth 2 (two) times daily.     Biotin 5000 MCG Tabs  Take 5,000 mcg by mouth daily.     cholecalciferol 1000 units tablet  Commonly known as:  VITAMIN D  Take 1,000 Units by mouth every Monday, Wednesday, and Friday.     dexamethasone 4 MG tablet  Commonly known as:  DECADRON  Take 5 tablets with food 12 hrs prior to Taxol chemotherapy.     FISH OIL PO  Take 360 mg by mouth daily.     ibuprofen 800 MG tablet  Commonly known as:  ADVIL,MOTRIN  Reported on 07/19/2015     LORazepam 0.5 MG tablet  Commonly known as:  ATIVAN  Place 1/2 to 1 tablet under the tongue or swallow every 6 hours as needed for nausea. Will make drowsy     Magnesium 250 MG Tabs  Take 250 mg by mouth every Monday, Wednesday, and Friday.     multivitamin with minerals Tabs tablet  Take 1 tablet by mouth daily.     naproxen 500 MG tablet  Commonly known as:  NAPROSYN  Take 1 tablet (500 mg total) by mouth daily.      ondansetron 8 MG tablet  Commonly known as:  ZOFRAN  Take 1 tablet (8 mg total) by mouth every 8 (eight) hours as needed for nausea or vomiting (Will not make drowsy.).     polyethylene glycol packet  Commonly known as:  MIRALAX / GLYCOLAX  Take 17 g by mouth daily as needed.     potassium gluconate 595 (99 K) MG Tabs tablet  Take 595 mg by mouth every Monday, Wednesday, and Friday.     simvastatin 20 MG tablet  Commonly known as:  ZOCOR  Take 20 mg by mouth daily.     Vitamin B-12 5000 MCG Subl  Place 1 tablet under the tongue daily.         PHYSICAL EXAMINATION  Oncology Vitals 09/08/2015 09/07/2015  Height - -  Weight - -  Weight (lbs) - -  BMI (kg/m2) - -  Temp 98.5 97.9  Pulse 73 77  Resp - 16  SpO2 - 100  BSA (m2) - -   BP Readings from Last 2 Encounters:  09/08/15 137/71  09/07/15 143/71    Physical Exam  Constitutional: She is oriented to person, place, and time and well-developed, well-nourished, and in no distress.  HENT:  Head: Normocephalic and atraumatic.  Eyes: Conjunctivae and EOM are normal. Pupils are equal, round, and reactive to light. Right eye exhibits no discharge. Left eye exhibits no discharge. No scleral icterus.  Neck: Normal range of motion.  Musculoskeletal: Normal range of motion. She exhibits edema. She exhibits no tenderness.  Patient did experience a mild extravasation of the Taxol chemotherapy infusion to her left radial IV site.  Patient has a small amount of edema, mild erythema, but no tenderness to the left radial IV site the chemotherapy was infusing.  Infusion was immediately held; and IV catheter was removed.  Cold compresses were placed to the site and the arm was elevated.         Neurological: She is alert and oriented to person, place, and time.  Skin: Skin is warm and dry. There is erythema.  See extravasation note.   Psychiatric: Affect normal.  Nursing note and vitals reviewed.   LABORATORY DATA:. Appointment on  09/07/2015  Component Date Value Ref Range Status  . WBC 09/07/2015 2.2* 3.9 - 10.3 10e3/uL Final  . NEUT# 09/07/2015 1.8  1.5 - 6.5 10e3/uL Final  . HGB 09/07/2015 11.9  11.6 - 15.9 g/dL Final  . HCT 09/07/2015 36.3  34.8 - 46.6 % Final  . Platelets 09/07/2015 220  145 - 400 10e3/uL Final  . MCV 09/07/2015 98.7  79.5 - 101.0 fL Final  . MCH 09/07/2015 32.4  25.1 - 34.0 pg Final  . MCHC 09/07/2015 32.8  31.5 - 36.0 g/dL Final  . RBC 09/07/2015 3.68* 3.70 - 5.45 10e6/uL Final  . RDW 09/07/2015 13.9  11.2 - 14.5 % Final  . lymph# 09/07/2015 0.4* 0.9 - 3.3 10e3/uL Final  . MONO# 09/07/2015 0.0* 0.1 - 0.9 10e3/uL Final  . Eosinophils Absolute 09/07/2015 0.0  0.0 - 0.5 10e3/uL Final  . Basophils Absolute 09/07/2015 0.0  0.0 - 0.1 10e3/uL Final  . NEUT% 09/07/2015 79.9* 38.4 - 76.8 % Final  . LYMPH% 09/07/2015 17.4  14.0 - 49.7 % Final  . MONO% 09/07/2015 1.8  0.0 - 14.0 % Final  . EOS% 09/07/2015 0.0  0.0 - 7.0 % Final  . BASO% 09/07/2015 0.9  0.0 - 2.0 % Final  . Sodium 09/07/2015 140  136 - 145 mEq/L Final  . Potassium 09/07/2015 4.0  3.5 - 5.1 mEq/L Final  . Chloride 09/07/2015 107  98 - 109 mEq/L Final  . CO2 09/07/2015 21* 22 - 29 mEq/L Final  . Glucose 09/07/2015 200* 70 - 140 mg/dl Final   Glucose reference range is for nonfasting patients. Fasting glucose reference range is 70- 100.  Marland Kitchen BUN 09/07/2015 23.7  7.0 - 26.0 mg/dL Final  . Creatinine 09/07/2015 0.8  0.6 - 1.1 mg/dL Final  . Total Bilirubin 09/07/2015 0.65  0.20 - 1.20 mg/dL Final  . Alkaline Phosphatase 09/07/2015 48  40 - 150 U/L Final  . AST 09/07/2015 21  5 - 34 U/L Final  . ALT 09/07/2015 19  0 - 55 U/L Final  . Total Protein 09/07/2015 7.0  6.4 - 8.3 g/dL Final  . Albumin 09/07/2015 3.7  3.5 -  5.0 g/dL Final  . Calcium 09/07/2015 9.9  8.4 - 10.4 mg/dL Final  . Anion Gap 09/07/2015 12* 3 - 11 mEq/L Final  . EGFR 09/07/2015 71* >90 ml/min/1.73 m2 Final   eGFR is calculated using the CKD-EPI Creatinine Equation  (2009)    RADIOGRAPHIC STUDIES: No results found.  ASSESSMENT/PLAN:    Endometrial cancer, FIGO stage IVB [facility]) Patient presented to the [facility] today to receive cycle 2, day 8 of the Taxol only portion of her carboplatin/Taxol chemotherapy regimen.  Patient did experience a mild extravasation of the Taxol chemotherapy infusion to her left radial IV site.  See further notes for details.  Patient is scheduled to return tomorrow, 08/31/2015 for a Granix injection. She will also receive a Granix injection on Saturday 4/29. She will return 09/14/15 for labs, visit, and chemo.  Extravasation, infusion or chemotherapeutic agent Patient presented to the [facility] today to receive cycle 2, day 8 of the Taxol only portion of her carboplatin/Taxol chemotherapy regimen.  Patient did experience a mild extravasation of the Taxol chemotherapy infusion to her left radial IV site.  Patient has a small amount of edema, mild erythema, but no tenderness to the left radial IV site the chemotherapy was infusing.  Infusion was immediately held; and IV catheter was removed.  Cold compresses were placed to the site and the arm was elevated.    Peripheral IV was initiated to the right upper extremity; and patient was able to complete her chemotherapy today without any further issues.  Note: Patient is a very hard IV stick; and took a total of 6-8 IV sticks for patient to receive her chemotherapy today.  The cancer Center nurse reviewed the option of obtaining a Port-A-Cath for future use-patient stated that she will think about it.  Patient has been scheduled for follow-up per extravasation protocol.  Patient also knows to call/return to go directly to the emergency department for any worsening symptoms whatsoever.   Patient is scheduled to return tomorrow, 08/31/2015 for a Granix injection. She will also receive a Granix injection on Saturday 4/29. She will return 09/14/15 for labs, visit, and  chemo.    Patient stated understanding of all instructions; and was in agreement with this plan of care. The patient knows to call the clinic with any problems, questions or concerns.   Total time spent with patient was 15 minutes;  with greater than 75 percent of that time spent in face to face counseling regarding patient's symptoms,  and coordination of care and follow up.  Disclaimer:This dictation was prepared with Dragon/digital dictation along with [employer]. Any transcriptional errors that result from this process are unintentional.  Drue Second, NP 09/09/2015

## 2015-09-09 NOTE — Assessment & Plan Note (Addendum)
Patient presented to the [facility] today to receive cycle 2, day 8 of the Taxol only portion of her carboplatin/Taxol chemotherapy regimen.  Patient did experience a mild extravasation of the Taxol chemotherapy infusion to her left radial IV site.  Patient has a small amount of edema, mild erythema, but no tenderness to the left radial IV site the chemotherapy was infusing.  Infusion was immediately held; and IV catheter was removed.  Cold compresses were placed to the site and the arm was elevated.    Peripheral IV was initiated to the right upper extremity; and patient was able to complete her chemotherapy today without any further issues.  Note: Patient is a very hard IV stick; and took a total of 6-8 IV sticks for patient to receive her chemotherapy today.  The cancer Center nurse reviewed the option of obtaining a Port-A-Cath for future use-patient stated that she will think about it.  Patient has been scheduled for follow-up per extravasation protocol.  Patient also knows to call/return to go directly to the emergency department for any worsening symptoms whatsoever.   Patient is scheduled to return tomorrow, 08/31/2015 for a Granix injection. She will also receive a Granix injection on Saturday 4/29. She will return 09/14/15 for labs, visit, and chemo.

## 2015-09-09 NOTE — Assessment & Plan Note (Signed)
Patient presented to the [facility] today to receive cycle 2, day 8 of the Taxol only portion of her carboplatin/Taxol chemotherapy regimen.  Patient did experience a mild extravasation of the Taxol chemotherapy infusion to her left radial IV site.  See further notes for details.  Patient is scheduled to return tomorrow, 08/31/2015 for a Granix injection. She will also receive a Granix injection on Saturday 4/29. She will return 09/14/15 for labs, visit, and chemo.

## 2015-09-12 ENCOUNTER — Telehealth: Payer: Self-pay

## 2015-09-12 ENCOUNTER — Other Ambulatory Visit: Payer: Self-pay | Admitting: Radiology

## 2015-09-12 DIAGNOSIS — C549 Malignant neoplasm of corpus uteri, unspecified: Secondary | ICD-10-CM

## 2015-09-12 MED ORDER — LIDOCAINE-PRILOCAINE 2.5-2.5 % EX CREA: TOPICAL_CREAM | CUTANEOUS | Status: DC

## 2015-09-12 NOTE — Telephone Encounter (Signed)
Spoke with Ms. Laplant regarding placement of PAC as noted below by Dr. Marko Plume. She is agreement to placement.  [facility] is booked through 09-19-15.  [facility] can place [facility] tomorrow 09-13-15 at 0800. Gave Ms. Bisesi instructions: NPO after MN.  May take am meds.  Arrive at [employer] to [facility] at [facility] and register in admitting.  She will have a driver.  Instructed patient in the use and timing  of the EMLA cream for Access of PAC 09-14-15. Sent prescription in to patient's pharmacy.

## 2015-09-12 NOTE — Telephone Encounter (Signed)
-----   Message from Gordy Levan, MD sent at 09/09/2015  9:25 AM EDT ----- She will have to have PAC before further chemo. Cannot tell from EMR that patient agreed to this when discussed in infusion, but not safe to give chemo with inadequate peripheral veins.  Scheduled to see me on 5-4 with Rx same day, so may need to delay that treatment depending on timing of PAC. Also ok to move my apt/ labs if that works better.  I have sent POF and order for IR [facility] placement RN please speak with patient by phone, let me know if any concerns. Will need EMLA script  Thank you Lennis   ----- Message -----    From: Susanne Borders, NP    Sent: 09/09/2015   8:46 AM      To: Baruch Merl, RN, Gordy Levan, MD  Pt is apparently a very hard IV stick. She had 6-8 sticks on Thursday; and had an extravasation of her taxol to left radial site. All was managed per protocol.  (It was a small extravasation).    Nurses mentioned port to pt; and she said she would think about it.   Cyndee

## 2015-09-13 ENCOUNTER — Other Ambulatory Visit: Payer: Self-pay | Admitting: Oncology

## 2015-09-13 ENCOUNTER — Ambulatory Visit (HOSPITAL_COMMUNITY)
Admission: RE | Admit: 2015-09-13 | Discharge: 2015-09-13 | Disposition: A | Discharge status: Home / Self Care | Payer: Medicare Other | Source: Ambulatory Visit | Attending: Oncology | Admitting: Oncology

## 2015-09-13 DIAGNOSIS — E7800 Pure hypercholesterolemia, unspecified: Secondary | ICD-10-CM | POA: Insufficient documentation

## 2015-09-13 DIAGNOSIS — Z9221 Personal history of antineoplastic chemotherapy: Secondary | ICD-10-CM | POA: Diagnosis not present

## 2015-09-13 DIAGNOSIS — E785 Hyperlipidemia, unspecified: Secondary | ICD-10-CM | POA: Insufficient documentation

## 2015-09-13 DIAGNOSIS — Z7982 Long term (current) use of aspirin: Secondary | ICD-10-CM | POA: Diagnosis not present

## 2015-09-13 DIAGNOSIS — I493 Ventricular premature depolarization: Secondary | ICD-10-CM | POA: Diagnosis not present

## 2015-09-13 DIAGNOSIS — I10 Essential (primary) hypertension: Secondary | ICD-10-CM | POA: Diagnosis not present

## 2015-09-13 DIAGNOSIS — C541 Malignant neoplasm of endometrium: Secondary | ICD-10-CM | POA: Insufficient documentation

## 2015-09-13 DIAGNOSIS — Z452 Encounter for adjustment and management of vascular access device: Secondary | ICD-10-CM | POA: Diagnosis not present

## 2015-09-13 DIAGNOSIS — Z8249 Family history of ischemic heart disease and other diseases of the circulatory system: Secondary | ICD-10-CM | POA: Diagnosis not present

## 2015-09-13 DIAGNOSIS — Z87891 Personal history of nicotine dependence: Secondary | ICD-10-CM | POA: Diagnosis not present

## 2015-09-13 DIAGNOSIS — Z853 Personal history of malignant neoplasm of breast: Secondary | ICD-10-CM | POA: Diagnosis not present

## 2015-09-13 LAB — COMPREHENSIVE METABOLIC PANEL
ALT: 20 U/L (ref 14–54)
AST: 25 U/L (ref 15–41)
Albumin: 3.6 g/dL (ref 3.5–5.0)
Alkaline Phosphatase: 43 U/L (ref 38–126)
Anion gap: 9 (ref 5–15)
BUN: 15 mg/dL (ref 6–20)
CO2: 27 mmol/L (ref 22–32)
Calcium: 9.3 mg/dL (ref 8.9–10.3)
Chloride: 104 mmol/L (ref 101–111)
Creatinine, Ser: 0.78 mg/dL (ref 0.44–1.00)
GFR calc Af Amer: >60 mL/min (ref >60)
GFR calc non Af Amer: >60 mL/min (ref >60)
Glucose, Bld: 97 mg/dL (ref 65–99)
Potassium: 3.5 mmol/L (ref 3.5–5.1)
Sodium: 140 mmol/L (ref 135–145)
Total Bilirubin: 1.1 mg/dL (ref 0.3–1.2)
Total Protein: 6.3 g/dL — ABNORMAL LOW (ref 6.5–8.1)

## 2015-09-13 LAB — PROTIME-INR
INR: 1.02 (ref 0.00–1.49)
Prothrombin Time: 13.6 seconds (ref 11.6–15.2)

## 2015-09-13 MED ORDER — FENTANYL CITRATE (PF) 100 MCG/2ML IJ SOLN
INTRAMUSCULAR | Status: AC | PRN
  Administered 2015-09-13 (×2): 25 ug via INTRAVENOUS

## 2015-09-13 MED ORDER — MIDAZOLAM HCL 2 MG/2ML IJ SOLN
INTRAMUSCULAR | Status: AC
  Filled 2015-09-13: qty 2

## 2015-09-13 MED ORDER — LIDOCAINE-EPINEPHRINE (PF) 1 %-1:200000 IJ SOLN
INTRAMUSCULAR | Status: AC
  Filled 2015-09-13: qty 30

## 2015-09-13 MED ORDER — FENTANYL CITRATE (PF) 100 MCG/2ML IJ SOLN
INTRAMUSCULAR | Status: AC
  Filled 2015-09-13: qty 2

## 2015-09-13 MED ORDER — CEFAZOLIN SODIUM-DEXTROSE 2-4 GM/100ML-% IV SOLN: 2.0000 g | INTRAVENOUS | Status: DC

## 2015-09-13 MED ORDER — MIDAZOLAM HCL 2 MG/2ML IJ SOLN
INTRAMUSCULAR | Status: AC | PRN
  Administered 2015-09-13 (×2): 0.5 mg via INTRAVENOUS

## 2015-09-13 MED ORDER — HEPARIN SOD (PORK) LOCK FLUSH 100 UNIT/ML IV SOLN
INTRAVENOUS | Status: AC
  Filled 2015-09-13: qty 5

## 2015-09-13 MED ORDER — SODIUM CHLORIDE 0.9 % IV SOLN
INTRAVENOUS | Status: AC | PRN
  Administered 2015-09-13: 10 mL/h via INTRAVENOUS

## 2015-09-13 MED ORDER — SODIUM CHLORIDE 0.9 % IV SOLN: INTRAVENOUS | Status: DC

## 2015-09-13 MED ORDER — CEFAZOLIN SODIUM-DEXTROSE 2-4 GM/100ML-% IV SOLN
INTRAVENOUS | Status: AC
  Administered 2015-09-13: 2000 mg
  Filled 2015-09-13: qty 100

## 2015-09-13 NOTE — Sedation Documentation (Signed)
Patient denies pain and is resting comfortably.  

## 2015-09-13 NOTE — Sedation Documentation (Signed)
Patient is resting comfortably. 

## 2015-09-13 NOTE — Procedures (Signed)
Successful LT IJ POWER PORT Tip svc/ra No comp Stable EBL 0 Full report in PACS

## 2015-09-13 NOTE — H&P (Signed)
Chief Complaint: Metastatic endometrial carcinoma  Referring Physician(s): Livesay,Lennis P  Supervising Physician: Daryll Brod  Patient Status: Out-pt  History of Present Illness: [name] is a 75 y.o. female with endometrial cancer which was diagnosed in 2009.  She underwent a laparoscopic hysterectomy with staging.  She was treated with chemo and vaginal cuff brachytherapy  She developed LUQ pain in 2012 and CT scan showed left pelvic sidewall involvement.  She had a laparoscopic evaluation at [facility] with finding of dense fibrosis in the area as well as mass encasing obturator nerve and vein as well as internal iliac vasculature; left ureterolysis was performed. No pathology was submitted from that procedure  Follow up CT in May 2013 showed enlargement of the left pelvic sidewall involvement.and she was treated with radiation therapy.  She had regular follow up scans which did not show progressive disease.   She developed some back pain and had an MRI lumbar spine which showed soft tissue mass at L3-4 involving left neural foramen, with focal cortical destruction of adjacent L3 vertebral body and portion of left L4 pedicle.   She had CT biopsy on 06-21-14. Pathology showed  poorly differentiated carcinoma consistent with high grade serous.   Bone scan 06-22-14 had uptake posteriorly in upper lumbar spine and a subtle increased area of uptake right greater trochanter of unclear significance.  She is here today for a Port A cath placement.  She feels well today. No fever/chills or recent illness.  No blood thinners. She is NPO  Past Medical History  Diagnosis Date  . Hypertension   . Palpitations   . History of breast cancer 2000    right breast -- treated with lumpectomy and radiation therarpy, postoperatively with tamoxifen   . Hyperlipidemia   . Hypercholesterolemia   . PVC's (premature ventricular contractions)   . S/P radiation therapy 10/28/11-12/05/11   5040 cGy left pelvis  . S/P radiation therapy     Intracavitary brachytherapy of uterus  . Radiation 07/11/14-08/12/14    left lumbar paraspinal area 55 gray  . Endometrial cancer ([place]) 12/2007    s/p total abdominal hysterectomy at [facility] - ovaries were also removed     Past Surgical History  Procedure Laterality Date  . Total abdominal hysterectomy w/ bilateral salpingoophorectomy    . Breast lumpectomy    . Appendectomy    . Tonsillectomy    . Foot surgery      RIGHT    Allergies: Ace inhibitors; Codeine; Demerol; and Dilaudid  Medications: Prior to Admission medications   Medication Sig Start Date End Date Taking? Authorizing Provider  amLODipine (NORVASC) 5 MG tablet Take 5 mg by mouth daily.   Yes Historical Provider, MD  aspirin EC 81 MG tablet Take 81 mg by mouth daily.   Yes Historical Provider, MD  atenolol (TENORMIN) 25 MG tablet Take 25 mg by mouth 2 (two) times daily.  01/08/11  Yes Darlin Coco, MD  Biotin 5000 MCG TABS Take 5,000 mcg by mouth daily.   Yes Historical Provider, MD  cholecalciferol (VITAMIN D) 1000 UNITS tablet Take 1,000 Units by mouth every Monday, Wednesday, and Friday.   Yes Historical Provider, MD  Cyanocobalamin (VITAMIN B-12) 5000 MCG SUBL Place 1 tablet under the tongue daily.    Yes Historical Provider, MD  dexamethasone (DECADRON) 4 MG tablet Take 5 tablets with food 12 hrs prior to Taxol chemotherapy. Patient taking differently: Take 20 mg by mouth every 12 (twelve) hours as needed (prior to  Taxol chemotherapy). Take 5 tablets with food 12 hrs prior to Taxol chemotherapy. 08/21/15  Yes Lennis Marion Downer, MD  ibuprofen (ADVIL,MOTRIN) 200 MG tablet Take 200-400 mg by mouth 2 (two) times daily as needed.   Yes Historical Provider, MD  lidocaine-prilocaine (EMLA) cream Apply to [facility] cath 1-2 hours prior to access as directed 09/12/15  Yes Lennis Marion Downer, MD  Magnesium 250 MG TABS Take 250 mg by mouth every Monday, Wednesday, and Friday.    Yes  Historical Provider, MD  Multiple Vitamin (MULTIVITAMIN WITH MINERALS) TABS tablet Take 1 tablet by mouth daily.   Yes Historical Provider, MD  Omega-3 Fatty Acids (FISH OIL PO) Take 360 mg by mouth daily.   Yes Historical Provider, MD  polyethylene glycol (MIRALAX / GLYCOLAX) packet Take 17 g by mouth daily as needed for mild constipation.    Yes Historical Provider, MD  potassium gluconate 595 MG TABS tablet Take 595 mg by mouth every Monday, Wednesday, and Friday.   Yes Historical Provider, MD  simvastatin (ZOCOR) 20 MG tablet Take 20 mg by mouth daily.   Yes Historical Provider, MD  ALPRAZolam Duanne Moron) 0.25 MG tablet Take 0.25 mg by mouth 2 (two) times daily as needed for anxiety (when flying). Reported on 07/19/2015    Historical Provider, MD     Family History  Problem Relation Age of Onset  . Thrombosis Father     coronary thrombosis  . Hypertension Father   . Heart failure Mother   . Coronary artery disease Brother     Social History   Social History  . Marital Status: Married    Spouse Name: N/A  . Number of Children: N/A  . Years of Education: N/A   Social History Main Topics  . Smoking status: Former Smoker    Types: Cigarettes    Quit date: 05/13/1962  . Smokeless tobacco: Never Used  . Alcohol Use: 3.0 oz/week    5 Glasses of wine per week  . Drug Use: No  . Sexual Activity: No   Other Topics Concern  . Not on file   Social History Narrative     Review of Systems: A 12 point ROS discussed a Review of Systems  Constitutional: Negative for fever, chills, activity change, appetite change and fatigue.  HENT: Negative.   Respiratory: Negative for cough, shortness of breath and wheezing.   Cardiovascular: Negative for chest pain.  Gastrointestinal: Negative for nausea, vomiting and abdominal pain.  Genitourinary: Negative.   Musculoskeletal: Negative.   Skin: Negative.   Neurological: Negative.   Hematological: Negative.   Psychiatric/Behavioral: Negative.       Vital Signs: BP 160/73 mmHg  Temp(Src) 98.3 F (36.8 C) (Oral)  Resp 16  Ht '4\' 11"'$  (1.499 m)  Wt 107 lb (48.535 kg)  BMI 21.60 kg/m2  SpO2 98%  Physical Exam  Constitutional: She is oriented to person, place, and time. She appears well-developed and well-nourished.  HENT:  Head: Normocephalic and atraumatic.  Eyes: EOM are normal.  Neck: Normal range of motion. Neck supple.  Cardiovascular: Normal rate, regular rhythm and normal heart sounds.   Pulmonary/Chest: Effort normal and breath sounds normal. No respiratory distress. She has no wheezes.  Abdominal: Soft. Bowel sounds are normal. She exhibits no distension. There is no tenderness.  Musculoskeletal: Normal range of motion.  Neurological: She is alert and oriented to person, place, and time.  Skin: Skin is warm and dry.  Psychiatric: She has a normal mood and affect. Her behavior  is normal. Judgment and thought content normal.  Vitals reviewed.   Mallampati Score:  MD Evaluation Airway: WNL Heart: WNL Abdomen: WNL ASA  Classification: 3 Mallampati/Airway Score: One  Imaging: No results found.  Labs:  CBC:  Recent Labs  08/17/15 1132 08/21/15 1054 08/31/15 0951 09/07/15 0912  WBC 0.8 Repeated and Verified* 12.0* 3.8* 2.2*  HGB 11.7 11.3* 13.0 11.9  HCT 34.5* 33.7* 40.5 36.3  PLT 184 129* 159 220    COAGS: No results for input(s): INR, APTT in the last 8760 hours.  BMP:  Recent Labs  08/21/15 1054 08/31/15 0952 09/07/15 0912 09/13/15 0645  NA 143 141 140 140  K 4.0 3.8 4.0 3.5  CL  --   --   --  104  CO2 26 21* 21* 27  GLUCOSE 83 201* 200* 97  BUN 18.8 22.5 23.7 15  CALCIUM 9.7 9.8 9.9 9.3  CREATININE 0.8 0.8 0.8 0.78  GFRNONAA  --   --   --  >60  GFRAA  --   --   --  >60    LIVER FUNCTION TESTS:  Recent Labs  08/21/15 1054 08/31/15 0952 09/07/15 0912 09/13/15 0645  BILITOT 0.38 0.86 0.65 1.1  AST '25 23 21 25  '$ ALT '17 17 19 20  '$ ALKPHOS 56 49 48 43  PROT 6.4 7.3 7.0 6.3*   ALBUMIN 3.3* 3.6 3.7 3.6    TUMOR MARKERS: No results for input(s): AFPTM, CEA, CA199, CHROMGRNA in the last 8760 hours.  Assessment and Plan:  Metastatic endometrial carcinoma  Will proceed with placement of Port A Cath today by Dr. Annamaria Boots  Risks and Benefits discussed with the patient including, but not limited to bleeding, infection, pneumothorax, or fibrin sheath development and need for additional procedures.  All of the patient's questions were answered, patient is agreeable to proceed. Consent signed and in chart.  Thank you for this interesting consult.  I greatly enjoyed meeting [name] and look forward to participating in their care.  A copy of this report was sent to the requesting provider on this date.  Electronically Signed: Murrell Redden 09/13/2015, 7:52 AM   I spent a total of  30 Minutes  in face to face in clinical consultation, greater than 50% of which was counseling/coordinating care for placement of a Port A cath

## 2015-09-13 NOTE — Discharge Instructions (Signed)

## 2015-09-13 NOTE — Sedation Documentation (Signed)
Pt with some discomfort, additional medication given as discussed with Dr. Annamaria Boots. VSS, please see IR narrator

## 2015-09-14 ENCOUNTER — Other Ambulatory Visit (HOSPITAL_BASED_OUTPATIENT_CLINIC_OR_DEPARTMENT_OTHER): Payer: Medicare Other

## 2015-09-14 ENCOUNTER — Ambulatory Visit (HOSPITAL_BASED_OUTPATIENT_CLINIC_OR_DEPARTMENT_OTHER): Payer: Medicare Other | Admitting: Oncology

## 2015-09-14 ENCOUNTER — Ambulatory Visit: Payer: Medicare Other

## 2015-09-14 ENCOUNTER — Telehealth: Payer: Self-pay | Admitting: Oncology

## 2015-09-14 ENCOUNTER — Encounter: Payer: Self-pay | Admitting: Oncology

## 2015-09-14 VITALS — BP 135/68 | HR 78 | Temp 97.7°F | Resp 18 | Ht 59.0 in | Wt 109.3 lb

## 2015-09-14 DIAGNOSIS — D701 Agranulocytosis secondary to cancer chemotherapy: Secondary | ICD-10-CM

## 2015-09-14 DIAGNOSIS — C7951 Secondary malignant neoplasm of bone: Secondary | ICD-10-CM | POA: Diagnosis not present

## 2015-09-14 DIAGNOSIS — T451X5A Adverse effect of antineoplastic and immunosuppressive drugs, initial encounter: Secondary | ICD-10-CM

## 2015-09-14 DIAGNOSIS — C541 Malignant neoplasm of endometrium: Secondary | ICD-10-CM

## 2015-09-14 DIAGNOSIS — D72819 Decreased white blood cell count, unspecified: Secondary | ICD-10-CM | POA: Diagnosis not present

## 2015-09-14 DIAGNOSIS — R911 Solitary pulmonary nodule: Secondary | ICD-10-CM | POA: Diagnosis not present

## 2015-09-14 LAB — CBC WITH DIFFERENTIAL/PLATELET
BASO%: 0.5 % (ref 0.0–2.0)
Basophils Absolute: 0 10*3/uL (ref 0.0–0.1)
EOS%: 0 % (ref 0.0–7.0)
Eosinophils Absolute: 0 10*3/uL (ref 0.0–0.5)
HCT: 34.2 % — ABNORMAL LOW (ref 34.8–46.6)
HGB: 11.2 g/dL — ABNORMAL LOW (ref 11.6–15.9)
LYMPH%: 21.9 % (ref 14.0–49.7)
MCH: 32.2 pg (ref 25.1–34.0)
MCHC: 32.7 g/dL (ref 31.5–36.0)
MCV: 98.7 fL (ref 79.5–101.0)
MONO#: 0 10*3/uL — ABNORMAL LOW (ref 0.1–0.9)
MONO%: 2.9 % (ref 0.0–14.0)
NEUT#: 1.2 10*3/uL — ABNORMAL LOW (ref 1.5–6.5)
NEUT%: 74.7 % (ref 38.4–76.8)
Platelets: 218 10*3/uL (ref 145–400)
RBC: 3.47 10*6/uL — ABNORMAL LOW (ref 3.70–5.45)
RDW: 14.3 % (ref 11.2–14.5)
WBC: 1.6 10*3/uL — ABNORMAL LOW (ref 3.9–10.3)
lymph#: 0.4 10*3/uL — ABNORMAL LOW (ref 0.9–3.3)

## 2015-09-14 MED ORDER — TBO-FILGRASTIM 300 MCG/0.5ML ~~LOC~~ SOSY
300.0000 ug | PREFILLED_SYRINGE | Freq: Once | SUBCUTANEOUS | Status: AC
  Administered 2015-09-14: 300 ug via SUBCUTANEOUS
  Filled 2015-09-14: qty 0.5

## 2015-09-14 NOTE — Progress Notes (Signed)
OFFICE PROGRESS NOTE   Sep 17, 2015   Physicians: P.Gehrig, J.Kinard, (T.Brackbill) J.Russo, S.Ganem  INTERVAL HISTORY:  Patient is seen, alone for visit, in continuing attention to metastatic endometrial carcinoma, this recently progressive in lumbar spine (+ slightly enlarged RLL pulmonary nodule, which dates back several years) and for which she is receiving dose dense carbo taxol. She was due day 15 cycle 2 today, however ANC is only 1.2 so will not treat today.  Last imaging was PET 05-11-15 and CT CAP 07-11-15.   Patient required multiple IV attempts with treatment on 09-07-15, followed by taxol infiltration left wrist. She had no complications from the taxol infiltration, which has resolved. She then agreed to [facility] placement, done by IR on 09-13-15. She has just minimal soreness at the new [facility] today.   She denies any increased or different back pain from her chronic symptoms related to DJD and scoliosis. She has no new or different pain otherwise. Appetite is ok, does have excessive flatulence frequently which she says has been a problem for last few years, eats yogurt daily, not clearly food related. She continues to walk several times weekly, no marked fatigue or further near syncopal episodes. No significant peripheral neuropathy. Bladder ok. No bleeding. No LE swelling.  Remainder of 10 point Review of Systems negative/ unchanged.    PAC placed by IR on 09-13-15 Flu vaccine 01-26-15  ONCOLOGIC HISTORY  BREAST CANCER: microinvasive T1N0 right breast cancer which was ER/PR positive and HER-2 2+ with insufficient material for FISH at diagnosis in 03/1999, treated with lumpectomy with 2 sentinel node evaluation, local radiation and 5 years of Tamoxifen thru 05/2004, then aromatase inhibitor 07/2004 thru 04/2006.   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  She had 6 cycles of taxol/carboplatin through Jan 2010, and vaginal cuff brachytherapy. She did well until some vague LUQ symptoms in Nov. 2012, with CT AP in [facility] Mar 22, 2011 showing left pelvic sidewall involvement. She had laparoscopic evaluation at [facility] by [place] 04-25-2011, with finding of dense fibrosis in the area as well as mass encasing obturator nerve and vein as well as internal iliac vasculature; left ureterolysis was performed. No pathology was submitted from that procedure. She received 3 additional cycles of taxol/ carboplatin from 05-16-2011 thru 06-28-2011. CT AP on 07-17-2011 showed improvement in the left pelvic sidewall mass now 1.9 x 2 cm and 3.4 cm cephalocaudad, as compared with 2.5 x 2.2 cm and 5.4 cm on CT 03-22-11, and no progressive disease elsewhere. She saw Dr.Gehrig after the CT, recommendation for an additonal 3 cycles of same chemotherapy . Cycle 4 was taxol + carbo and neulasta, cycle 5 taxol only due to counts and cycle 6 taxol + carboplatin on 09-17-11. Follow up CT in May 2013 showed enlargement of the left pelvic sidewall involvement. She had IMRT 50 GY in 28 fractions by Dr Sondra Come, that completed 12-05-11. She had regular follow up scans, Including CT AP 01-2014 and PET 02-2013, which did not show progressive disease. She has scoliosis and some chronic intermittent low back discomfort, but developed different, persistent discomfort low back and radiating to LLE by ~ Dec into Jan 2016. MRI lumbar spine at [facility] on 06-13-2014 showed known scoliosis and degenerative disc disease, with new finding of 2.7 x 2.4 x 1.9 cm soft tissue mass at L3-4 involving left neural foramen, with focal cortical destruction of  adjacent L3 vertebral body and portion of left L4 pedicle. She had CT biopsy on 06-21-14, with pathology ([id number]) poorly differentiated carcinoma consistent with high grade serous. Bone scan 06-22-14 had uptake posteriorly in upper lumbar spine and a subtle  increased area of uptake right greater trochanter of unclear significance. IMRT by Dr Sondra Come 2-29 thru 08-12-14. Zometa x4 07-07-14 thru 10-2014. Progression at L1-2 and possible RLL lung nodule found on PET 04-2015 and CT CAP 06-2015. She began dose dense carbo taxol on 08-03-15. She was neutropenic by day 15 cycle 1, ANC 0.5 and granix added.  Objective:  Vital signs in last 24 hours:  BP 135/68 mmHg  Pulse 78  Temp(Src) 97.7 F (36.5 C) (Oral)  Resp 18  Ht '4\' 11"'$  (1.499 m)  Wt 109 lb 4.8 oz (49.578 kg)  BMI 22.06 kg/m2  SpO2 100% Weight stable.  Alert, oriented and appropriate. Ambulatory without difficulty.  HEENT:PERRL, sclerae not icteric. Oral mucosa moist without lesions, posterior pharynx clear.  Neck supple. No JVD.  Lymphatics:no supraclavicular adenopathy Resp: clear to auscultation bilaterally and normal percussion bilaterally Cardio: regular rate and rhythm. No gallop. GI: soft, nontender, not distended, no mass or organomegaly. Normally active bowel sounds. Surgical incision not remarkable. Musculoskeletal/ Extremities: LE without pitting edema, cords, tenderness. Left wrist area of chemo infiltration no erythema, swelling or tenderness. Neuro: no peripheral neuropathy. Otherwise nonfocal Skin without rash, ecchymosis, petechiae Portacath- site with minimal bruising, placement seems good, no unexpected tenderness.  Lab Results:  Results for orders placed or performed in visit on 09/14/15  CBC with Differential  Result Value Ref Range   WBC 1.6 (L) 3.9 - 10.3 10e3/uL   NEUT# 1.2 (L) 1.5 - 6.5 10e3/uL   HGB 11.2 (L) 11.6 - 15.9 g/dL   HCT 34.2 (L) 34.8 - 46.6 %   Platelets 218 145 - 400 10e3/uL   MCV 98.7 79.5 - 101.0 fL   MCH 32.2 25.1 - 34.0 pg   MCHC 32.7 31.5 - 36.0 g/dL   RBC 3.47 (L) 3.70 - 5.45 10e6/uL   RDW 14.3 11.2 - 14.5 %   lymph# 0.4 (L) 0.9 - 3.3 10e3/uL   MONO# 0.0 (L) 0.1 - 0.9 10e3/uL   Eosinophils Absolute 0.0 0.0 - 0.5 10e3/uL   Basophils  Absolute 0.0 0.0 - 0.1 10e3/uL   NEUT% 74.7 38.4 - 76.8 %   LYMPH% 21.9 14.0 - 49.7 %   MONO% 2.9 0.0 - 14.0 %   EOS% 0.0 0.0 - 7.0 %   BASO% 0.5 0.0 - 2.0 %    CMET done with PAC 09-13-15 normal with exception of T prot 6.3. INR normal at 1.02 on 09-13-15  Studies/Results: No complications with PAC placement with fluoro by IR 09-13-15  Medications: I have reviewed the patient's current medications. Carbo dose cycle 2 was AUC =4, so will increase granix dose rather than cutting carbo further now, using 480 mcg instead of 300 mcg on same schedule  DISCUSSION Will omit day 15 cycle 2. Will treat with day 1 cycle 3 on 09-21-15 as long as ANC >=1.5 and plt >=100k that day. Will give granix today and 5-5 at 300 mcg, then starting cycle 3 will use 480 mcg days 2,3,9, 16.   Mentioned possible lactose intolerance or other food intolerances for the flatulence. I prefer not using probiotics when she may have low ANC from chemo (she has not tried these).   She hopes to go to beach, but prefers to keep chemo on  schedule as much as possible. Will repeat scans after cycle 3. Dr Alycia Rossetti will be in [place] on 11-08-15, and we can communicate scan results to her prior to that visit.   Assessment/Plan:  1.Papillary serous endometrial carcinoma IB at diagnosis Aug 2009, recurrent at left pelvic sidewall 03-2011 and progression in L1-2 in spring 2016, now further progression in same lumbar area (+ slighly enlarged RLL pulmonary nodule which dates back several years) . Day 1 cycle 1 dose dense carbo taxol on 08-03-15, with carbo skin test, some hypotension better with adjustment in antihypertensives, and neutropenic day 15. Omit day 15 cycle 2 with leukopenia now, continue with day 1 cycle 3 on 09-21-15 as long as ANC >=1.5, plt >=100k and carbo skin test negative, with increased dose granix.. Repeat scans after 3 cycles.   2.T1N0 right breast cancer 03-1999: adjuvant therapy included 5 years of tamoxifen. Not  recurrent. Up to date on mammograms as of 10-18-14, tomo medically appropriate due to density of breast tissue 3.scoliosis of lumbar spine with degenerative disc disease and chronic symptoms related to this, stable 4.HTN followed by Dr Virgina Jock. She had difficulty with prior chemo with lower BP and tachycardia also, and hypotension day 5 cycle 1. Holding atenolol and amlodipine prn, pushing fluids.  5. flu vaccine done 6.mild anemia multifactorial, not symptomatic. Follow 7.chemo infiltration day 8 cycle 2, resolved 8.PAC placed by IR on 09-13-15 9.complaints of excessive lower bowel gas, not new: : may be multifactorial with chemo, previous surgery and RT, possibly dietary.    All questions answered and patient is in agreement with recommendations and plans. Chemo and granix orders confirmed. Time spent 25 min including >50% counseling and coordination of care. Route PCP, cc Dr Cammie Mcgee, MD   09/17/2015, 12:27 PM

## 2015-09-14 NOTE — Telephone Encounter (Signed)
appt made and avs printed. CT to be sch by central radiology

## 2015-09-15 ENCOUNTER — Ambulatory Visit (HOSPITAL_BASED_OUTPATIENT_CLINIC_OR_DEPARTMENT_OTHER): Payer: Medicare Other

## 2015-09-15 DIAGNOSIS — C7951 Secondary malignant neoplasm of bone: Secondary | ICD-10-CM

## 2015-09-15 MED ORDER — TBO-FILGRASTIM 300 MCG/0.5ML ~~LOC~~ SOSY
300.0000 ug | PREFILLED_SYRINGE | Freq: Once | SUBCUTANEOUS | Status: AC
  Administered 2015-09-15: 300 ug via SUBCUTANEOUS
  Filled 2015-09-15: qty 0.5

## 2015-09-17 ENCOUNTER — Other Ambulatory Visit: Payer: Self-pay | Admitting: Oncology

## 2015-09-17 DIAGNOSIS — T451X5A Adverse effect of antineoplastic and immunosuppressive drugs, initial encounter: Secondary | ICD-10-CM

## 2015-09-17 DIAGNOSIS — D701 Agranulocytosis secondary to cancer chemotherapy: Secondary | ICD-10-CM | POA: Insufficient documentation

## 2015-09-18 ENCOUNTER — Telehealth: Payer: Self-pay

## 2015-09-18 ENCOUNTER — Other Ambulatory Visit: Payer: Self-pay | Admitting: Oncology

## 2015-09-18 DIAGNOSIS — C541 Malignant neoplasm of endometrium: Secondary | ICD-10-CM

## 2015-09-18 MED ORDER — DEXAMETHASONE 4 MG PO TABS: ORAL_TABLET | ORAL | Status: DC

## 2015-09-18 NOTE — Telephone Encounter (Signed)
-----   Message from Gordy Levan, MD sent at 09/17/2015 12:00 PM EDT ----- Please refill decadron for 3 weeks of treatment  thanks

## 2015-09-18 NOTE — Telephone Encounter (Signed)
S/w [name] that Decadron was refilled for next 3 treatments

## 2015-09-21 ENCOUNTER — Other Ambulatory Visit: Payer: Self-pay | Admitting: Oncology

## 2015-09-21 ENCOUNTER — Ambulatory Visit (HOSPITAL_BASED_OUTPATIENT_CLINIC_OR_DEPARTMENT_OTHER): Payer: Medicare Other

## 2015-09-21 ENCOUNTER — Ambulatory Visit (HOSPITAL_BASED_OUTPATIENT_CLINIC_OR_DEPARTMENT_OTHER): Payer: Medicare Other | Admitting: Nurse Practitioner

## 2015-09-21 ENCOUNTER — Other Ambulatory Visit (HOSPITAL_BASED_OUTPATIENT_CLINIC_OR_DEPARTMENT_OTHER): Payer: Medicare Other

## 2015-09-21 VITALS — BP 144/77 | HR 71 | Temp 98.0°F | Resp 17

## 2015-09-21 DIAGNOSIS — C7951 Secondary malignant neoplasm of bone: Secondary | ICD-10-CM | POA: Diagnosis not present

## 2015-09-21 DIAGNOSIS — C541 Malignant neoplasm of endometrium: Secondary | ICD-10-CM | POA: Diagnosis not present

## 2015-09-21 DIAGNOSIS — Z5111 Encounter for antineoplastic chemotherapy: Secondary | ICD-10-CM

## 2015-09-21 DIAGNOSIS — T829XXA Unspecified complication of cardiac and vascular prosthetic device, implant and graft, initial encounter: Secondary | ICD-10-CM

## 2015-09-21 DIAGNOSIS — C549 Malignant neoplasm of corpus uteri, unspecified: Secondary | ICD-10-CM

## 2015-09-21 LAB — CBC WITH DIFFERENTIAL/PLATELET
BASO%: 0.3 % (ref 0.0–2.0)
Basophils Absolute: 0 10*3/uL (ref 0.0–0.1)
EOS%: 0 % (ref 0.0–7.0)
Eosinophils Absolute: 0 10*3/uL (ref 0.0–0.5)
HCT: 35.2 % (ref 34.8–46.6)
HGB: 11.5 g/dL — ABNORMAL LOW (ref 11.6–15.9)
LYMPH%: 10.3 % — ABNORMAL LOW (ref 14.0–49.7)
MCH: 32.5 pg (ref 25.1–34.0)
MCHC: 32.8 g/dL (ref 31.5–36.0)
MCV: 99 fL (ref 79.5–101.0)
MONO#: 0.1 10*3/uL (ref 0.1–0.9)
MONO%: 2.6 % (ref 0.0–14.0)
NEUT#: 3.9 10*3/uL (ref 1.5–6.5)
NEUT%: 86.8 % — ABNORMAL HIGH (ref 38.4–76.8)
Platelets: 148 10*3/uL (ref 145–400)
RBC: 3.55 10*6/uL — ABNORMAL LOW (ref 3.70–5.45)
RDW: 14.9 % — ABNORMAL HIGH (ref 11.2–14.5)
WBC: 4.5 10*3/uL (ref 3.9–10.3)
lymph#: 0.5 10*3/uL — ABNORMAL LOW (ref 0.9–3.3)

## 2015-09-21 LAB — COMPREHENSIVE METABOLIC PANEL
ALT: 20 U/L (ref 0–55)
AST: 23 U/L (ref 5–34)
Albumin: 3.6 g/dL (ref 3.5–5.0)
Alkaline Phosphatase: 60 U/L (ref 40–150)
Anion Gap: 10 mEq/L (ref 3–11)
BUN: 25.2 mg/dL (ref 7.0–26.0)
CO2: 25 mEq/L (ref 22–29)
Calcium: 9.5 mg/dL (ref 8.4–10.4)
Chloride: 106 mEq/L (ref 98–109)
Creatinine: 0.9 mg/dL (ref 0.6–1.1)
EGFR: 63 mL/min/{1.73_m2} — ABNORMAL LOW (ref >90)
Glucose: 201 mg/dl — ABNORMAL HIGH (ref 70–140)
Potassium: 4.2 mEq/L (ref 3.5–5.1)
Sodium: 140 mEq/L (ref 136–145)
Total Bilirubin: 0.49 mg/dL (ref 0.20–1.20)
Total Protein: 6.9 g/dL (ref 6.4–8.3)

## 2015-09-21 MED ORDER — HEPARIN SOD (PORK) LOCK FLUSH 100 UNIT/ML IV SOLN
500.0000 [IU] | Freq: Once | INTRAVENOUS | Status: AC | PRN
  Administered 2015-09-21: 500 [IU]
  Filled 2015-09-21: qty 5

## 2015-09-21 MED ORDER — FAMOTIDINE IN NACL 20-0.9 MG/50ML-% IV SOLN
INTRAVENOUS | Status: AC
  Filled 2015-09-21: qty 50

## 2015-09-21 MED ORDER — DIPHENHYDRAMINE HCL 50 MG/ML IJ SOLN
12.5000 mg | Freq: Once | INTRAMUSCULAR | Status: AC
  Administered 2015-09-21: 12.5 mg via INTRAVENOUS

## 2015-09-21 MED ORDER — SODIUM CHLORIDE 0.9 % IV SOLN
Freq: Once | INTRAVENOUS | Status: AC
  Administered 2015-09-21: 13:00:00 via INTRAVENOUS

## 2015-09-21 MED ORDER — SODIUM CHLORIDE 0.9 % IV SOLN
80.0000 mg/m2 | Freq: Once | INTRAVENOUS | Status: AC
  Administered 2015-09-21: 114 mg via INTRAVENOUS
  Filled 2015-09-21: qty 19

## 2015-09-21 MED ORDER — DIPHENHYDRAMINE HCL 50 MG/ML IJ SOLN
INTRAMUSCULAR | Status: AC
  Filled 2015-09-21: qty 1

## 2015-09-21 MED ORDER — CARBOPLATIN CHEMO INTRADERMAL TEST DOSE 100MCG/0.02ML
100.0000 ug | Freq: Once | INTRADERMAL | Status: AC
  Administered 2015-09-21: 100 ug via INTRADERMAL
  Filled 2015-09-21: qty 0.01

## 2015-09-21 MED ORDER — FAMOTIDINE IN NACL 20-0.9 MG/50ML-% IV SOLN
20.0000 mg | Freq: Once | INTRAVENOUS | Status: AC
  Administered 2015-09-21: 20 mg via INTRAVENOUS

## 2015-09-21 MED ORDER — SODIUM CHLORIDE 0.9% FLUSH
10.0000 mL | INTRAVENOUS | Status: DC | PRN
  Administered 2015-09-21: 10 mL
  Filled 2015-09-21: qty 10

## 2015-09-21 MED ORDER — SODIUM CHLORIDE 0.9 % IV SOLN
Freq: Once | INTRAVENOUS | Status: AC
  Administered 2015-09-21: 13:00:00 via INTRAVENOUS
  Filled 2015-09-21: qty 8

## 2015-09-21 MED ORDER — SODIUM CHLORIDE 0.9 % IV SOLN
254.4000 mg | Freq: Once | INTRAVENOUS | Status: AC
  Administered 2015-09-21: 250 mg via INTRAVENOUS
  Filled 2015-09-21: qty 25

## 2015-09-21 NOTE — Patient Instructions (Signed)
[facility] Discharge Instructions for Patients Receiving Chemotherapy  Today you received the following chemotherapy agents:  Carboplatin, Taxol  To help prevent nausea and vomiting after your treatment, we encourage you to take your nausea medication as prescribed.   If you develop nausea and vomiting that is not controlled by your nausea medication, call the clinic.   BELOW ARE SYMPTOMS THAT SHOULD BE REPORTED IMMEDIATELY:  *FEVER GREATER THAN 100.5 F  *CHILLS WITH OR WITHOUT FEVER  NAUSEA AND VOMITING THAT IS NOT CONTROLLED WITH YOUR NAUSEA MEDICATION  *UNUSUAL SHORTNESS OF BREATH  *UNUSUAL BRUISING OR BLEEDING  TENDERNESS IN MOUTH AND THROAT WITH OR WITHOUT PRESENCE OF ULCERS  *URINARY PROBLEMS  *BOWEL PROBLEMS  UNUSUAL RASH Items with * indicate a potential emergency and should be followed up as soon as possible.  Feel free to call the clinic you have any questions or concerns. The clinic phone number is [phone].  Please show the CHEMO ALERT CARD at check-in to the Emergency Department and triage nurse.   

## 2015-09-22 ENCOUNTER — Ambulatory Visit (HOSPITAL_BASED_OUTPATIENT_CLINIC_OR_DEPARTMENT_OTHER): Payer: Medicare Other

## 2015-09-22 ENCOUNTER — Telehealth: Payer: Self-pay | Admitting: *Deleted

## 2015-09-22 VITALS — BP 154/69 | HR 75 | Temp 98.1°F | Resp 18

## 2015-09-22 DIAGNOSIS — C7951 Secondary malignant neoplasm of bone: Secondary | ICD-10-CM | POA: Diagnosis present

## 2015-09-22 DIAGNOSIS — D701 Agranulocytosis secondary to cancer chemotherapy: Secondary | ICD-10-CM

## 2015-09-22 DIAGNOSIS — T451X5A Adverse effect of antineoplastic and immunosuppressive drugs, initial encounter: Principal | ICD-10-CM

## 2015-09-22 MED ORDER — TBO-FILGRASTIM 480 MCG/0.8ML ~~LOC~~ SOSY
480.0000 ug | PREFILLED_SYRINGE | Freq: Once | SUBCUTANEOUS | Status: AC
  Administered 2015-09-22: 480 ug via SUBCUTANEOUS
  Filled 2015-09-22: qty 0.8

## 2015-09-22 NOTE — Patient Instructions (Signed)

## 2015-09-22 NOTE — Telephone Encounter (Signed)
"  I have a series of appointments 10-05-2015 in the morning.  Can these be scheduled in the afternoon at 1:00 or 2:00?  Return number [phone]."

## 2015-09-23 ENCOUNTER — Ambulatory Visit (HOSPITAL_BASED_OUTPATIENT_CLINIC_OR_DEPARTMENT_OTHER): Payer: Medicare Other

## 2015-09-23 ENCOUNTER — Encounter: Payer: Self-pay | Admitting: Nurse Practitioner

## 2015-09-23 VITALS — BP 133/56 | HR 69 | Temp 98.4°F | Resp 17

## 2015-09-23 DIAGNOSIS — C541 Malignant neoplasm of endometrium: Secondary | ICD-10-CM

## 2015-09-23 DIAGNOSIS — T829XXA Unspecified complication of cardiac and vascular prosthetic device, implant and graft, initial encounter: Secondary | ICD-10-CM | POA: Insufficient documentation

## 2015-09-23 DIAGNOSIS — D701 Agranulocytosis secondary to cancer chemotherapy: Secondary | ICD-10-CM

## 2015-09-23 DIAGNOSIS — T451X5A Adverse effect of antineoplastic and immunosuppressive drugs, initial encounter: Principal | ICD-10-CM

## 2015-09-23 DIAGNOSIS — Z5189 Encounter for other specified aftercare: Secondary | ICD-10-CM

## 2015-09-23 DIAGNOSIS — C7951 Secondary malignant neoplasm of bone: Secondary | ICD-10-CM

## 2015-09-23 MED ORDER — TBO-FILGRASTIM 480 MCG/0.8ML ~~LOC~~ SOSY
480.0000 ug | PREFILLED_SYRINGE | Freq: Once | SUBCUTANEOUS | Status: AC
  Administered 2015-09-23: 480 ug via SUBCUTANEOUS

## 2015-09-23 NOTE — Progress Notes (Signed)
SYMPTOM MANAGEMENT CLINIC    Chief Complaint: Port-A-Cath complication  HPI:  [name] 75 y.o. female diagnosed with endometrial cancer; with bone metastasis.  Currently undergoing carboplatin/Taxol chemotherapy regimen.   Patient had a left upper chest Port-A-Cath placed by interventional radiology on Wednesday, 09/13/2015.  She presented to the [facility] today with concern that there was some continued bruising and mild tenderness at the insertion site and directly above the Port-A-Cath site.  Exam today reveals that Port-A-Cath site is healing well; with no evidence of infection.  There is some trace bruising only directly above the Port-A-Cath insertion site.  There is no surrounding edema, warmth, tenderness with palpation, or red streaks.  Patient was advised to monitor the site closely; let us know if there are any changes in the site.    Malignant neoplasm of corpus uteri, except isthmus ([place])   12/22/2007 Surgery Staging for IA UPSC    - 05/28/2008 Chemotherapy 6 cycles of paclitaxel and carboplatin with vaginal cuff brachytherapy   03/22/2011 Relapse/Recurrence Left pelvic node recurrence   04/25/2011 Surgery L/S evaluation, not resectable.   05/16/2011 - 09/25/2011 Chemotherapy 6 cyclces paclitaxel and carboplatin    - 12/27/2011 Radiation Therapy radiation completed   03/10/2013 Progression progression left pelvic mass   04/02/2013 Surgery Resection and IORT   06/13/2014 Relapse/Recurrence soft tissue mass at L3-4   07/11/2014 - 08/12/2014 Radiation Therapy IMRT + zometa    Endometrial cancer ([place]) (Resolved)   12/01/2007 Initial Diagnosis Endometrial cancer   12/15/2007 Surgery    01/22/2008 -  Chemotherapy 6 cycles paclitaxol and carboplatin    Radiation Therapy HDR   03/31/2011 Relapse/Recurrence chemotherapy and external beam radiation   05/16/2011 - 09/23/2011 Chemotherapy    10/28/2011 - 12/05/2011 Radiation Therapy radiation for pelvic sidewall recurrence   03/10/2013  Progression    04/02/2013 Surgery pelvic mass resection with IORT   06/13/2014 Relapse/Recurrence + recurrence paraspinous muscle   07/11/2014 - 08/12/2014 Radiation Therapy    05/02/2015 Progression increase size of paraspinal mass. In the prior radiation field.    Metastatic cancer to bone ([place])   06/27/2014 Initial Diagnosis Metastatic cancer to bone    Review of Systems  Skin:       Patient had a left upper chest Port-A-Cath placed by interventional radiology on Wednesday, 09/13/2015.  She presented to the [place] [facility] today with concern that there was some continued bruising and mild tenderness at the insertion site and directly above the Port-A-Cath site.      Past Medical History  Diagnosis Date  . Hypertension   . Palpitations   . History of breast cancer 2000    right breast -- treated with lumpectomy and radiation therarpy, postoperatively with tamoxifen   . Hyperlipidemia   . Hypercholesterolemia   . PVC's (premature ventricular contractions)   . S/P radiation therapy 10/28/11-12/05/11    5040 cGy left pelvis  . S/P radiation therapy     Intracavitary brachytherapy of uterus  . Radiation 07/11/14-08/12/14    left lumbar paraspinal area 55 gray  . Endometrial cancer ([place]) 12/2007    s/p total abdominal hysterectomy at [facility] - ovaries were also removed     Past Surgical History  Procedure Laterality Date  . Total abdominal hysterectomy w/ bilateral salpingoophorectomy    . Breast lumpectomy    . Appendectomy    . Tonsillectomy    . 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    is allergic to ace inhibitors; codeine; demerol; and dilaudid.    Medication List       This list is accurate as of: 09/21/15 11:59 PM.  Always use your most recent med list.               ALPRAZolam 0.25 MG tablet  Commonly known as:  XANAX  Take 0.25 mg by mouth 2 (two) times daily as needed for anxiety (when flying). Reported on 07/19/2015     amLODipine 5 MG tablet  Commonly known as:  NORVASC  Take 5 mg by mouth daily.     aspirin EC 81 MG tablet  Take 81 mg by mouth daily.     atenolol 25 MG tablet  Commonly known as:  TENORMIN  Take 25 mg by mouth 2 (two) times daily.     Biotin 5000 MCG Tabs  Take 5,000 mcg by mouth daily.     cholecalciferol 1000 units tablet  Commonly known as:  VITAMIN D  Take 1,000 Units by mouth every Monday, Wednesday, and Friday.     dexamethasone 4 MG tablet  Commonly known as:  DECADRON  Take 5 tablets with food 12 hrs prior to Taxol chemotherapy.     FISH OIL PO  Take 360 mg by mouth daily.     ibuprofen 200 MG tablet  Commonly known as:  ADVIL,MOTRIN  Take 200-400 mg by mouth 2 (two) times daily as needed.     lidocaine-prilocaine cream  Commonly known as:  EMLA  Apply to [facility] cath 1-2 hours prior to access as directed     Magnesium 250 MG Tabs  Take 250 mg by mouth every Monday, Wednesday, and Friday.     multivitamin with minerals Tabs tablet  Take 1 tablet by mouth daily.     polyethylene glycol packet  Commonly known as:  MIRALAX / GLYCOLAX  Take 17 g by mouth daily as needed for mild constipation.     potassium gluconate 595 (99 K) MG Tabs tablet  Take 595 mg by mouth every Monday, Wednesday, and Friday.     simvastatin 20 MG tablet  Commonly known as:  ZOCOR  Take 20 mg by mouth daily.     Vitamin B-12 5000 MCG Subl  Place 1 tablet under the tongue daily.          PHYSICAL EXAMINATION  Oncology Vitals 09/23/2015 09/22/2015  Height - -  Weight - -  Weight (lbs) - -  BMI (kg/m2) - -  Temp 98.4 98.1  Pulse 69 75  Resp 17 18  SpO2 100 100  BSA (m2) - -   BP Readings from Last 2 Encounters:  09/23/15 133/56  09/22/15 154/69    Physical Exam  Constitutional: She is oriented to person, place, and time and well-developed, well-nourished, and in no distress.  HENT:  Head: Normocephalic and atraumatic.  Eyes: Conjunctivae and EOM are normal. Pupils are equal, round, and reactive to light. Right eye exhibits no discharge. Left eye exhibits no discharge. No scleral icterus.  Neck: Normal range of motion.  Pulmonary/Chest: Effort normal. No respiratory  distress.  Musculoskeletal: Normal range of motion. She exhibits no edema or tenderness.  Neurological: She is alert and oriented to person, place, and time. Gait normal.  Skin: Skin is warm. No rash noted. No erythema. No pallor.  Patient had a left upper chest Port-A-Cath placed by interventional radiology on Wednesday, 09/13/2015.  She presented to the [facility] today with concern that there was some continued bruising and mild tenderness at the insertion site and directly above the Port-A-Cath site.  Exam today reveals that Port-A-Cath site is healing well; with no evidence of infection.  There is some trace bruising only directly above the Port-A-Cath insertion site.  There is no surrounding edema, warmth, tenderness with palpation, or red streaks.  Patient was advised to monitor the site closely; let us know if there are any changes in the site.  Psychiatric: Affect normal.  Nursing note and vitals reviewed.   LABORATORY DATA:. Appointment on 09/21/2015  Component Date Value Ref Range Status  . WBC 09/21/2015 4.5  3.9 - 10.3 10e3/uL Final  . NEUT# 09/21/2015 3.9  1.5 - 6.5 10e3/uL Final  . HGB 09/21/2015 11.5* 11.6 - 15.9 g/dL Final  . HCT 09/21/2015 35.2  34.8 - 46.6 % Final  .  Platelets 09/21/2015 148  145 - 400 10e3/uL Final  . MCV 09/21/2015 99.0  79.5 - 101.0 fL Final  . MCH 09/21/2015 32.5  25.1 - 34.0 pg Final  . MCHC 09/21/2015 32.8  31.5 - 36.0 g/dL Final  . RBC 09/21/2015 3.55* 3.70 - 5.45 10e6/uL Final  . RDW 09/21/2015 14.9* 11.2 - 14.5 % Final  . lymph# 09/21/2015 0.5* 0.9 - 3.3 10e3/uL Final  . MONO# 09/21/2015 0.1  0.1 - 0.9 10e3/uL Final  . Eosinophils Absolute 09/21/2015 0.0  0.0 - 0.5 10e3/uL Final  . Basophils Absolute 09/21/2015 0.0  0.0 - 0.1 10e3/uL Final  . NEUT% 09/21/2015 86.8* 38.4 - 76.8 % Final  . LYMPH% 09/21/2015 10.3* 14.0 - 49.7 % Final  . MONO% 09/21/2015 2.6  0.0 - 14.0 % Final  . EOS% 09/21/2015 0.0  0.0 - 7.0 % Final  . BASO% 09/21/2015 0.3  0.0 - 2.0 % Final  . Sodium 09/21/2015 140  136 - 145 mEq/L Final  . Potassium 09/21/2015 4.2  3.5 - 5.1 mEq/L Final  . Chloride 09/21/2015 106  98 - 109 mEq/L Final  . CO2 09/21/2015 25  22 - 29 mEq/L Final  . Glucose 09/21/2015 201* 70 - 140 mg/dl Final   Glucose reference range is for nonfasting patients. Fasting glucose reference range is 70- 100.  Marland Kitchen BUN 09/21/2015 25.2  7.0 - 26.0 mg/dL Final  . Creatinine 09/21/2015 0.9  0.6 - 1.1 mg/dL Final  . Total Bilirubin 09/21/2015 0.49  0.20 - 1.20 mg/dL Final  . Alkaline Phosphatase 09/21/2015 60  40 - 150 U/L Final  . AST 09/21/2015 23  5 - 34 U/L Final  . ALT 09/21/2015 20  0 - 55 U/L Final  . Total Protein 09/21/2015 6.9  6.4 - 8.3 g/dL Final  . Albumin 09/21/2015 3.6  3.5 - 5.0 g/dL Final  . Calcium 09/21/2015 9.5  8.4 - 10.4 mg/dL Final  . Anion Gap 09/21/2015 10  3 - 11 mEq/L Final  . EGFR 09/21/2015 63* >90 ml/min/1.73 m2 Final   eGFR is calculated using the CKD-EPI Creatinine Equation (2009)     25  RADIOGRAPHIC STUDIES: No results found.  ASSESSMENT/PLAN:    Endometrial cancer, FIGO stage IVB (H[facility])  Patient presented to the [facility] today to receive cycle 3 of her carboplatin/Taxol chemotherapy regimen.  Patient  will return to the [facility] both Friday and Saturday for a Granix injection for growth factor support.  Patient will return on 09/28/2015 for labs, flush, and chemotherapy.    Central line complication Patient had a left upper chest Port-A-Cath placed by interventional radiology on Wednesday, 09/13/2015.  She presented to the [facility] today with concern that there was some continued bruising and mild tenderness at the insertion site and directly above the Port-A-Cath site.  Exam today reveals that Port-A-Cath site is healing well; with no evidence of infection.  There is some trace bruising only directly above the Port-A-Cath insertion site.  There is no surrounding edema, warmth, tenderness with palpation, or red streaks.  Patient was advised to monitor the site closely; let us know if there are any changes in the site.   Patient stated understanding of all instructions; and was in agreement with this plan of care. The patient knows to call the clinic with any problems, questions or concerns.   Total time spent with patient was 15 minutes;  with greater than 75 percent of that time spent in face to face counseling regarding patient's symptoms,  and coordination of care and follow up.  Disclaimer:This dictation was prepared with Dragon/digital dictation along with [employer]. Any transcriptional errors that result from this process are unintentional.  Drue Second, NP 09/23/2015

## 2015-09-23 NOTE — Assessment & Plan Note (Signed)
Patient had a left upper chest Port-A-Cath placed by interventional radiology on Wednesday, 09/13/2015.  She presented to the [facility] today with concern that there was some continued bruising and mild tenderness at the insertion site and directly above the Port-A-Cath site.  Exam today reveals that Port-A-Cath site is healing well; with no evidence of infection.  There is some trace bruising only directly above the Port-A-Cath insertion site.  There is no surrounding edema, warmth, tenderness with palpation, or red streaks.  Patient was advised to monitor the site closely; let us know if there are any changes in the site.

## 2015-09-23 NOTE — Patient Instructions (Signed)

## 2015-09-23 NOTE — Assessment & Plan Note (Signed)
Patient presented to the [facility] today to receive cycle 3 of her carboplatin/Taxol chemotherapy regimen.  Patient will return to the [facility] both Friday and Saturday for a Granix injection for growth factor support.  Patient will return on 09/28/2015 for labs, flush, and chemotherapy.

## 2015-09-25 ENCOUNTER — Other Ambulatory Visit: Payer: Self-pay | Admitting: Oncology

## 2015-09-25 NOTE — Telephone Encounter (Signed)
Spoke with [name] about coming  In 5-22 and then tx later.  This would not work with her schedule for 10-02-15.  She is fine with leaving appointments as scheduled.

## 2015-09-25 NOTE — Telephone Encounter (Signed)
I just saw this phone note re changing apt times on 5-25 (lab, MD, chemo early AM as presently scheduled), which patient would rather have in afternoon.  LL schedule full in afternoons 5-22 and 5-25. She could see K.Curcio in afternoon 5-22 with lab, then Rx 5-25 either at 0930 as scheduled or maybe can move later that day.   RN please see if patient likes this option and ask schedulers to fix it if so  I will need to be sure all orders in place and my next apt set up if she is not on my schedule  thanks

## 2015-09-27 ENCOUNTER — Other Ambulatory Visit: Payer: Self-pay | Admitting: Oncology

## 2015-09-28 ENCOUNTER — Other Ambulatory Visit (HOSPITAL_BASED_OUTPATIENT_CLINIC_OR_DEPARTMENT_OTHER): Payer: Medicare Other

## 2015-09-28 ENCOUNTER — Ambulatory Visit: Payer: Medicare Other

## 2015-09-28 ENCOUNTER — Ambulatory Visit (HOSPITAL_BASED_OUTPATIENT_CLINIC_OR_DEPARTMENT_OTHER): Payer: Medicare Other

## 2015-09-28 VITALS — BP 143/65 | HR 73 | Temp 98.2°F | Resp 18

## 2015-09-28 DIAGNOSIS — Z5111 Encounter for antineoplastic chemotherapy: Secondary | ICD-10-CM

## 2015-09-28 DIAGNOSIS — C7951 Secondary malignant neoplasm of bone: Secondary | ICD-10-CM

## 2015-09-28 DIAGNOSIS — C541 Malignant neoplasm of endometrium: Secondary | ICD-10-CM

## 2015-09-28 DIAGNOSIS — C549 Malignant neoplasm of corpus uteri, unspecified: Secondary | ICD-10-CM

## 2015-09-28 DIAGNOSIS — Z95828 Presence of other vascular implants and grafts: Secondary | ICD-10-CM

## 2015-09-28 LAB — CBC WITH DIFFERENTIAL/PLATELET
BASO%: 0.2 % (ref 0.0–2.0)
Basophils Absolute: 0 10*3/uL (ref 0.0–0.1)
EOS%: 0 % (ref 0.0–7.0)
Eosinophils Absolute: 0 10*3/uL (ref 0.0–0.5)
HCT: 30.2 % — ABNORMAL LOW (ref 34.8–46.6)
HGB: 10.2 g/dL — ABNORMAL LOW (ref 11.6–15.9)
LYMPH%: 7.7 % — ABNORMAL LOW (ref 14.0–49.7)
MCH: 32.8 pg (ref 25.1–34.0)
MCHC: 33.8 g/dL (ref 31.5–36.0)
MCV: 97.1 fL (ref 79.5–101.0)
MONO#: 0.3 10*3/uL (ref 0.1–0.9)
MONO%: 5.6 % (ref 0.0–14.0)
NEUT#: 3.8 10*3/uL (ref 1.5–6.5)
NEUT%: 86.5 % — ABNORMAL HIGH (ref 38.4–76.8)
Platelets: 146 10*3/uL (ref 145–400)
RBC: 3.11 10*6/uL — ABNORMAL LOW (ref 3.70–5.45)
RDW: 14.8 % — ABNORMAL HIGH (ref 11.2–14.5)
WBC: 4.4 10*3/uL (ref 3.9–10.3)
lymph#: 0.3 10*3/uL — ABNORMAL LOW (ref 0.9–3.3)

## 2015-09-28 LAB — COMPREHENSIVE METABOLIC PANEL
ALT: 19 U/L (ref 0–55)
AST: 20 U/L (ref 5–34)
Albumin: 3.5 g/dL (ref 3.5–5.0)
Alkaline Phosphatase: 64 U/L (ref 40–150)
Anion Gap: 8 mEq/L (ref 3–11)
BUN: 22.8 mg/dL (ref 7.0–26.0)
CO2: 23 mEq/L (ref 22–29)
Calcium: 9.5 mg/dL (ref 8.4–10.4)
Chloride: 106 mEq/L (ref 98–109)
Creatinine: 0.7 mg/dL (ref 0.6–1.1)
EGFR: 85 mL/min/{1.73_m2} — ABNORMAL LOW (ref >90)
Glucose: 135 mg/dl (ref 70–140)
Potassium: 4.2 mEq/L (ref 3.5–5.1)
Sodium: 138 mEq/L (ref 136–145)
Total Bilirubin: 0.73 mg/dL (ref 0.20–1.20)
Total Protein: 6.5 g/dL (ref 6.4–8.3)

## 2015-09-28 MED ORDER — SODIUM CHLORIDE 0.9% FLUSH
10.0000 mL | INTRAVENOUS | Status: DC | PRN
  Administered 2015-09-28: 10 mL via INTRAVENOUS
  Filled 2015-09-28: qty 10

## 2015-09-28 MED ORDER — FAMOTIDINE IN NACL 20-0.9 MG/50ML-% IV SOLN
20.0000 mg | Freq: Once | INTRAVENOUS | Status: AC
  Administered 2015-09-28: 20 mg via INTRAVENOUS

## 2015-09-28 MED ORDER — SODIUM CHLORIDE 0.9 % IV SOLN
Freq: Once | INTRAVENOUS | Status: AC
  Administered 2015-09-28: 11:00:00 via INTRAVENOUS

## 2015-09-28 MED ORDER — DIPHENHYDRAMINE HCL 50 MG/ML IJ SOLN
12.5000 mg | Freq: Once | INTRAMUSCULAR | Status: AC
  Administered 2015-09-28: 12:00:00 via INTRAVENOUS

## 2015-09-28 MED ORDER — SODIUM CHLORIDE 0.9 % IJ SOLN
10.0000 mL | INTRAMUSCULAR | Status: DC | PRN
  Administered 2015-09-28: 10 mL via INTRAVENOUS
  Filled 2015-09-28: qty 10

## 2015-09-28 MED ORDER — SODIUM CHLORIDE 0.9 % IV SOLN
80.0000 mg/m2 | Freq: Once | INTRAVENOUS | Status: AC
  Administered 2015-09-28: 114 mg via INTRAVENOUS
  Filled 2015-09-28: qty 19

## 2015-09-28 MED ORDER — HEPARIN SOD (PORK) LOCK FLUSH 100 UNIT/ML IV SOLN
500.0000 [IU] | Freq: Once | INTRAVENOUS | Status: AC
  Administered 2015-09-28: 500 [IU] via INTRAVENOUS
  Filled 2015-09-28: qty 5

## 2015-09-28 MED ORDER — FAMOTIDINE IN NACL 20-0.9 MG/50ML-% IV SOLN
INTRAVENOUS | Status: AC
  Filled 2015-09-28: qty 50

## 2015-09-28 MED ORDER — SODIUM CHLORIDE 0.9 % IV SOLN
Freq: Once | INTRAVENOUS | Status: AC
  Administered 2015-09-28: 11:00:00 via INTRAVENOUS
  Filled 2015-09-28: qty 4

## 2015-09-28 MED ORDER — DIPHENHYDRAMINE HCL 50 MG/ML IJ SOLN
INTRAMUSCULAR | Status: AC
  Filled 2015-09-28: qty 1

## 2015-09-28 NOTE — Patient Instructions (Signed)
[facility] Discharge Instructions for Patients Receiving Chemotherapy  Today you received the following chemotherapy agents Taxol   To help prevent nausea and vomiting after your treatment, we encourage you to take your nausea medication as directed.   If you develop nausea and vomiting that is not controlled by your nausea medication, call the clinic.   BELOW ARE SYMPTOMS THAT SHOULD BE REPORTED IMMEDIATELY:  *FEVER GREATER THAN 100.5 F  *CHILLS WITH OR WITHOUT FEVER  NAUSEA AND VOMITING THAT IS NOT CONTROLLED WITH YOUR NAUSEA MEDICATION  *UNUSUAL SHORTNESS OF BREATH  *UNUSUAL BRUISING OR BLEEDING  TENDERNESS IN MOUTH AND THROAT WITH OR WITHOUT PRESENCE OF ULCERS  *URINARY PROBLEMS  *BOWEL PROBLEMS  UNUSUAL RASH Items with * indicate a potential emergency and should be followed up as soon as possible.  Feel free to call the clinic you have any questions or concerns. The clinic phone number is [phone].  Please show the CHEMO ALERT CARD at check-in to the Emergency Department and triage nurse.   

## 2015-09-28 NOTE — Patient Instructions (Signed)

## 2015-09-29 ENCOUNTER — Ambulatory Visit: Payer: Medicare Other

## 2015-09-29 ENCOUNTER — Ambulatory Visit (HOSPITAL_BASED_OUTPATIENT_CLINIC_OR_DEPARTMENT_OTHER): Payer: Medicare Other

## 2015-09-29 VITALS — BP 138/70 | HR 69 | Temp 98.4°F | Resp 16

## 2015-09-29 DIAGNOSIS — T451X5A Adverse effect of antineoplastic and immunosuppressive drugs, initial encounter: Principal | ICD-10-CM

## 2015-09-29 DIAGNOSIS — D701 Agranulocytosis secondary to cancer chemotherapy: Secondary | ICD-10-CM | POA: Diagnosis present

## 2015-09-29 DIAGNOSIS — C7951 Secondary malignant neoplasm of bone: Secondary | ICD-10-CM | POA: Diagnosis not present

## 2015-09-29 MED ORDER — TBO-FILGRASTIM 480 MCG/0.8ML ~~LOC~~ SOSY
480.0000 ug | PREFILLED_SYRINGE | Freq: Once | SUBCUTANEOUS | Status: AC
Start: 2015-09-29 — End: 2015-09-29
  Administered 2015-09-29: 480 ug via SUBCUTANEOUS
  Filled 2015-09-29: qty 0.8

## 2015-10-03 ENCOUNTER — Other Ambulatory Visit: Payer: Self-pay | Admitting: Oncology

## 2015-10-04 ENCOUNTER — Other Ambulatory Visit: Payer: Self-pay | Admitting: Oncology

## 2015-10-05 ENCOUNTER — Telehealth: Payer: Self-pay

## 2015-10-05 ENCOUNTER — Telehealth: Payer: Self-pay | Admitting: Oncology

## 2015-10-05 ENCOUNTER — Other Ambulatory Visit (HOSPITAL_BASED_OUTPATIENT_CLINIC_OR_DEPARTMENT_OTHER): Payer: Medicare Other

## 2015-10-05 ENCOUNTER — Encounter: Payer: Self-pay | Admitting: Oncology

## 2015-10-05 ENCOUNTER — Ambulatory Visit: Payer: Medicare Other

## 2015-10-05 ENCOUNTER — Ambulatory Visit (HOSPITAL_BASED_OUTPATIENT_CLINIC_OR_DEPARTMENT_OTHER): Payer: Medicare Other | Admitting: Oncology

## 2015-10-05 ENCOUNTER — Ambulatory Visit (HOSPITAL_BASED_OUTPATIENT_CLINIC_OR_DEPARTMENT_OTHER): Payer: Medicare Other

## 2015-10-05 VITALS — BP 132/83 | HR 78 | Temp 98.5°F | Resp 18 | Wt 111.5 lb

## 2015-10-05 DIAGNOSIS — Z853 Personal history of malignant neoplasm of breast: Secondary | ICD-10-CM | POA: Diagnosis not present

## 2015-10-05 DIAGNOSIS — C541 Malignant neoplasm of endometrium: Secondary | ICD-10-CM

## 2015-10-05 DIAGNOSIS — C7951 Secondary malignant neoplasm of bone: Secondary | ICD-10-CM

## 2015-10-05 DIAGNOSIS — Z5111 Encounter for antineoplastic chemotherapy: Secondary | ICD-10-CM

## 2015-10-05 DIAGNOSIS — G620 Drug-induced polyneuropathy: Secondary | ICD-10-CM

## 2015-10-05 DIAGNOSIS — D701 Agranulocytosis secondary to cancer chemotherapy: Secondary | ICD-10-CM

## 2015-10-05 DIAGNOSIS — Z95828 Presence of other vascular implants and grafts: Secondary | ICD-10-CM

## 2015-10-05 DIAGNOSIS — T451X5A Adverse effect of antineoplastic and immunosuppressive drugs, initial encounter: Secondary | ICD-10-CM

## 2015-10-05 DIAGNOSIS — R922 Inconclusive mammogram: Secondary | ICD-10-CM

## 2015-10-05 DIAGNOSIS — C549 Malignant neoplasm of corpus uteri, unspecified: Secondary | ICD-10-CM

## 2015-10-05 LAB — COMPREHENSIVE METABOLIC PANEL
ALT: 21 U/L (ref 0–55)
AST: 22 U/L (ref 5–34)
Albumin: 3.5 g/dL (ref 3.5–5.0)
Alkaline Phosphatase: 52 U/L (ref 40–150)
Anion Gap: 10 mEq/L (ref 3–11)
BUN: 22.7 mg/dL (ref 7.0–26.0)
CO2: 21 mEq/L — ABNORMAL LOW (ref 22–29)
Calcium: 9.2 mg/dL (ref 8.4–10.4)
Chloride: 109 mEq/L (ref 98–109)
Creatinine: 0.8 mg/dL (ref 0.6–1.1)
EGFR: 70 mL/min/{1.73_m2} — ABNORMAL LOW (ref >90)
Glucose: 210 mg/dl — ABNORMAL HIGH (ref 70–140)
Potassium: 3.9 mEq/L (ref 3.5–5.1)
Sodium: 140 mEq/L (ref 136–145)
Total Bilirubin: 0.47 mg/dL (ref 0.20–1.20)
Total Protein: 6.3 g/dL — ABNORMAL LOW (ref 6.4–8.3)

## 2015-10-05 LAB — CBC WITH DIFFERENTIAL/PLATELET
BASO%: 0.4 % (ref 0.0–2.0)
Basophils Absolute: 0 10*3/uL (ref 0.0–0.1)
EOS%: 0 % (ref 0.0–7.0)
Eosinophils Absolute: 0 10*3/uL (ref 0.0–0.5)
HCT: 29.7 % — ABNORMAL LOW (ref 34.8–46.6)
HGB: 9.9 g/dL — ABNORMAL LOW (ref 11.6–15.9)
LYMPH%: 17.2 % (ref 14.0–49.7)
MCH: 32.6 pg (ref 25.1–34.0)
MCHC: 33.3 g/dL (ref 31.5–36.0)
MCV: 97.7 fL (ref 79.5–101.0)
MONO#: 0 10*3/uL — ABNORMAL LOW (ref 0.1–0.9)
MONO%: 1.3 % (ref 0.0–14.0)
NEUT#: 1.9 10*3/uL (ref 1.5–6.5)
NEUT%: 81.1 % — ABNORMAL HIGH (ref 38.4–76.8)
Platelets: 164 10*3/uL (ref 145–400)
RBC: 3.04 10*6/uL — ABNORMAL LOW (ref 3.70–5.45)
RDW: 15.2 % — ABNORMAL HIGH (ref 11.2–14.5)
WBC: 2.4 10*3/uL — ABNORMAL LOW (ref 3.9–10.3)
lymph#: 0.4 10*3/uL — ABNORMAL LOW (ref 0.9–3.3)

## 2015-10-05 MED ORDER — HEPARIN SOD (PORK) LOCK FLUSH 100 UNIT/ML IV SOLN
500.0000 [IU] | Freq: Once | INTRAVENOUS | Status: AC
  Administered 2015-10-05: 500 [IU] via INTRAVENOUS
  Filled 2015-10-05: qty 5

## 2015-10-05 MED ORDER — SODIUM CHLORIDE 0.9 % IV SOLN
80.0000 mg/m2 | Freq: Once | INTRAVENOUS | Status: AC
  Administered 2015-10-05: 114 mg via INTRAVENOUS
  Filled 2015-10-05: qty 19

## 2015-10-05 MED ORDER — DIPHENHYDRAMINE HCL 50 MG/ML IJ SOLN
INTRAMUSCULAR | Status: AC
  Filled 2015-10-05: qty 1

## 2015-10-05 MED ORDER — SODIUM CHLORIDE 0.9% FLUSH
10.0000 mL | Freq: Once | INTRAVENOUS | Status: AC
  Administered 2015-10-05: 10 mL via INTRAVENOUS
  Filled 2015-10-05: qty 10

## 2015-10-05 MED ORDER — FAMOTIDINE IN NACL 20-0.9 MG/50ML-% IV SOLN
INTRAVENOUS | Status: AC
  Filled 2015-10-05: qty 50

## 2015-10-05 MED ORDER — FERROUS FUMARATE 325 (106 FE) MG PO TABS: 1.0000 | ORAL_TABLET | Freq: Every day | ORAL | Status: DC

## 2015-10-05 MED ORDER — OFLOXACIN 0.3 % OT SOLN: 10.0000 [drp] | Freq: Two times a day (BID) | OTIC | Status: DC

## 2015-10-05 MED ORDER — SODIUM CHLORIDE 0.9 % IV SOLN
Freq: Once | INTRAVENOUS | Status: AC
  Administered 2015-10-05: 10:00:00 via INTRAVENOUS

## 2015-10-05 MED ORDER — DIPHENHYDRAMINE HCL 50 MG/ML IJ SOLN
12.5000 mg | Freq: Once | INTRAMUSCULAR | Status: AC
  Administered 2015-10-05: 12.5 mg via INTRAVENOUS

## 2015-10-05 MED ORDER — FAMOTIDINE IN NACL 20-0.9 MG/50ML-% IV SOLN
20.0000 mg | Freq: Once | INTRAVENOUS | Status: AC
  Administered 2015-10-05: 20 mg via INTRAVENOUS

## 2015-10-05 MED ORDER — SODIUM CHLORIDE 0.9 % IV SOLN
Freq: Once | INTRAVENOUS | Status: AC
  Administered 2015-10-05: 10:00:00 via INTRAVENOUS
  Filled 2015-10-05: qty 4

## 2015-10-05 MED ORDER — SODIUM CHLORIDE 0.9 % IJ SOLN
10.0000 mL | INTRAMUSCULAR | Status: DC | PRN
  Administered 2015-10-05: 10 mL via INTRAVENOUS
  Filled 2015-10-05: qty 10

## 2015-10-05 NOTE — Progress Notes (Signed)
OFFICE PROGRESS NOTE   Oct 08, 2015   Physicians: P.Gehrig, J.Kinard, (T.Brackbill) J.Russo, S.Ganem  INTERVAL HISTORY:  Patient is seen, alone for visit, in continuing attention to progressive metastatic endometrial carcinoma involving lumbar spine (and questionably RLL pulmonary nodule) for which she is receiving dose dense carbo taxol, with granix support. Last imaging was CT CAP 07-01-15 and PET 05-10-16.  She is due day 15 cycle 3 today, then will have repeat scans prior to seeing Dr Alycia Rossetti in June.  Patient is having some more difficulty with chemo, with more fatigue and some increased peripheral neuropathy feet and lower legs > hands. She has intermittent abdominal bloating and gaseousness, tho bowels generally move daily. She has no significant back pain other than chronic symptoms related to scoliosis. She has had no abdominal or pelvic pain, no nausea, PAC functioning well, no syncopal episodes, no fever and no bleeding. Bladder ok, no LE swelling. Appetite is fair and she tries to stay well hydrated. She had painful right ear last week, seemed to be external, less soreness now. Remainder of 10 point Review of Systems negative  PAC placed by IR on 09-13-15 Biopsy of recurrence 06-2014 ER negative. No genetics testing   ONCOLOGIC HISTORY BREAST CANCER: microinvasive T1N0 right breast cancer which was ER/PR positive and HER-2 2+ with insufficient material for FISH at diagnosis in 03/1999, treated with lumpectomy with 2 sentinel node evaluation, local radiation and 5 years of Tamoxifen thru 05/2004, then aromatase inhibitor 07/2004 thru 04/2006.   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 She had 6 cycles of taxol/carboplatin through Jan 2010, and vaginal cuff brachytherapy. She did well until some vague LUQ symptoms in Nov. 2012, with CT AP in [facility]  system Mar 22, 2011 showing left pelvic sidewall involvement. She had laparoscopic evaluation at [facility] by [place] 04-25-2011, with finding of dense fibrosis in the area as well as mass encasing obturator nerve and vein as well as internal iliac vasculature; left ureterolysis was performed. No pathology was submitted from that procedure. She received 3 additional cycles of taxol/ carboplatin from 05-16-2011 thru 06-28-2011. CT AP on 07-17-2011 showed improvement in the left pelvic sidewall mass now 1.9 x 2 cm and 3.4 cm cephalocaudad, as compared with 2.5 x 2.2 cm and 5.4 cm on CT 03-22-11, and no progressive disease elsewhere. She saw Dr.Gehrig after the CT, recommendation for an additonal 3 cycles of same chemotherapy . Cycle 4 was taxol + carbo and neulasta, cycle 5 taxol only due to counts and cycle 6 taxol + carboplatin on 09-17-11. Follow up CT in May 2013 showed enlargement of the left pelvic sidewall involvement. She had IMRT 50 GY in 28 fractions by Dr Sondra Come, that completed 12-05-11. She had regular follow up scans, Including CT AP 01-2014 and PET 02-2013, which did not show progressive disease. She has scoliosis and some chronic intermittent low back discomfort, but developed different, persistent discomfort low back and radiating to LLE by ~ Dec into Jan 2016. MRI lumbar spine at [facility] on 06-13-2014 showed known scoliosis and degenerative disc disease, with new finding of 2.7 x 2.4 x 1.9 cm soft tissue mass at L3-4 involving left neural foramen, with focal cortical destruction of adjacent L3 vertebral body and portion of left L4 pedicle. She had CT biopsy on 06-21-14, with pathology ([id number]) poorly differentiated carcinoma consistent with high grade serous. Bone scan 06-22-14 had uptake  posteriorly in upper lumbar spine and a subtle increased area of uptake right greater trochanter of unclear significance. IMRT by Dr Sondra Come 2-29 thru 08-12-14. Zometa x4 07-07-14 thru 10-2014. Progression at L1-2 and  possible RLL lung nodule found on PET 04-2015 and CT CAP 06-2015. She began dose dense carbo taxol on 08-03-15. She was neutropenic by day 15 cycle 1, ANC 0.5 and granix added.   Objective:  Vital signs in last 24 hours:  BP 132/83 mmHg  Pulse 78  Temp(Src) 98.5 F (36.9 C) (Oral)  Resp 18  Wt 111 lb 8 oz (50.576 kg)  SpO2 100% Weight up 2 lbs Alert, oriented and appropriate. Ambulatory without difficulty, easily able to get on and off exam table   HEENT:PERRL, sclerae not icteric. Oral mucosa moist without lesions, posterior pharynx clear. Left TM a little dull, no erythema or bulging. RIght TM with dullness superiorly and grayish discoloration at inferior edge, slight surrounding erythema in distal canal.   Neck supple. No JVD.  Lymphatics:no cervical,supraclavicular or inguinal adenopathy Resp: clear to auscultation bilaterally and normal percussion bilaterally Cardio: regular rate and rhythm. No gallop. GI: soft, nontender, not obviously distended, no mass or organomegaly. Normally active bowel sounds. Surgical incision not remarkable. Musculoskeletal/ Extremities: UE/LE without pitting edema, cords, tenderness. Spine not tender to palpation Neuro: peripheral neuropathy as noted. Otherwise nonfocal . PSYCH appropriate mood and affect Skin without rash, ecchymosis, petechiae Portacath-without erythema or tenderness  Lab Results:  Results for orders placed or performed in visit on 10/05/15  CBC with Differential  Result Value Ref Range   WBC 2.4 (L) 3.9 - 10.3 10e3/uL   NEUT# 1.9 1.5 - 6.5 10e3/uL   HGB 9.9 (L) 11.6 - 15.9 g/dL   HCT 29.7 (L) 34.8 - 46.6 %   Platelets 164 145 - 400 10e3/uL   MCV 97.7 79.5 - 101.0 fL   MCH 32.6 25.1 - 34.0 pg   MCHC 33.3 31.5 - 36.0 g/dL   RBC 3.04 (L) 3.70 - 5.45 10e6/uL   RDW 15.2 (H) 11.2 - 14.5 %   lymph# 0.4 (L) 0.9 - 3.3 10e3/uL   MONO# 0.0 (L) 0.1 - 0.9 10e3/uL   Eosinophils Absolute 0.0 0.0 - 0.5 10e3/uL   Basophils Absolute 0.0  0.0 - 0.1 10e3/uL   NEUT% 81.1 (H) 38.4 - 76.8 %   LYMPH% 17.2 14.0 - 49.7 %   MONO% 1.3 0.0 - 14.0 %   EOS% 0.0 0.0 - 7.0 %   BASO% 0.4 0.0 - 2.0 %  Comprehensive metabolic panel  Result Value Ref Range   Sodium 140 136 - 145 mEq/L   Potassium 3.9 3.5 - 5.1 mEq/L   Chloride 109 98 - 109 mEq/L   CO2 21 (L) 22 - 29 mEq/L   Glucose 210 (H) 70 - 140 mg/dl   BUN 22.7 7.0 - 26.0 mg/dL   Creatinine 0.8 0.6 - 1.1 mg/dL   Total Bilirubin 0.47 0.20 - 1.20 mg/dL   Alkaline Phosphatase 52 40 - 150 U/L   AST 22 5 - 34 U/L   ALT 21 0 - 55 U/L   Total Protein 6.3 (L) 6.4 - 8.3 g/dL   Albumin 3.5 3.5 - 5.0 g/dL   Calcium 9.2 8.4 - 10.4 mg/dL   Anion Gap 10 3 - 11 mEq/L   EGFR 70 (L) >90 ml/min/1.73 m2     Studies/Results:  No results found.  Will request PET, tho patient aware that insurance may require dedicated CT additionally  or prior.  Medications: I have reviewed the patient's current medications. Will add topical otic antibiotic, cipro if available. Add ferrous fumarate/ hemocyte daily on empty stomach with OJ  DISCUSSION Patient much prefers a short treatment break after completing cycle 3 today until she sees Dr Alycia Rossetti in June; will have repeat imaging prior to Dr Elenora Gamma visit.   Medications as above.  She should let physician know if right ear symptoms do not resolve.    Assessment/Plan:   1.Papillary serous endometrial carcinoma IB at diagnosis Aug 2009, recurrent at left pelvic sidewall 03-2011 and progression in L1-2 in spring 2016, now further progression in same lumbar area (+ slighly enlarged RLL pulmonary nodule which dates back several years) . She completes 3 cycles of dose dense carbo taxol today, with granix support and carbo skin testng. Will take short break next couple of weeks and repeat scans prior to seeing Dr Alycia Rossetti in June.  2.T1N0 right breast cancer 03-1999: adjuvant therapy included 5 years of tamoxifen. Not recurrent. [facility]  10-18-14, tomo due to dense breast tissue. Will order mammograms for 10-2015 3.scoliosis of lumbar spine with degenerative disc disease and chronic symptoms related to this, stable 4.HTN followed by Dr Virgina Jock. She had difficulty with prior chemo with lower BP and tachycardia also, and hypotension day 5 cycle 1. Holding atenolol and amlodipine prn, pushing fluids.  5. flu vaccine done 6.mild anemia multifactorial, not symptomatic. Follow 7.chemo infiltration day 8 cycle 2, resolved 8.PAC placed by IR on 09-13-15 9. Chemo peripheral neuropathy feet> hands, some increased with present taxol tho does not seem very bothersome now  All questions answered. Chemo and granix orders confirmed.  PET ordered, managed care notified. Route Dr Virgina Jock, cc Dr Alycia Rossetti. Time spent 25 min including >50% counseling and coordination of care.    Tajana Crotteau P, MD   10/08/2015, 2:36 PM

## 2015-10-05 NOTE — Telephone Encounter (Signed)
-----   Message from Gordy Levan, MD sent at 10/05/2015  9:09 AM EDT ----- 1. Ofloxacin otic qtts   10 drops bid x 7 days QS  2. Either Hemocyte if insurance covers otherwise ferrous fumarate DAW   ~ 325 mg   One daily on empty stomach with OJ (iron)  thanks

## 2015-10-05 NOTE — Telephone Encounter (Signed)
lvm per Dr LL attached message. 

## 2015-10-05 NOTE — Patient Instructions (Signed)

## 2015-10-05 NOTE — Patient Instructions (Signed)
[facility] Discharge Instructions for Patients Receiving Chemotherapy  Today you received the following chemotherapy agents Taxol  To help prevent nausea and vomiting after your treatment, we encourage you to take your nausea medication: As directed  If you develop nausea and vomiting that is not controlled by your nausea medication, call the clinic.   BELOW ARE SYMPTOMS THAT SHOULD BE REPORTED IMMEDIATELY:  *FEVER GREATER THAN 100.5 F  *CHILLS WITH OR WITHOUT FEVER  NAUSEA AND VOMITING THAT IS NOT CONTROLLED WITH YOUR NAUSEA MEDICATION  *UNUSUAL SHORTNESS OF BREATH  *UNUSUAL BRUISING OR BLEEDING  TENDERNESS IN MOUTH AND THROAT WITH OR WITHOUT PRESENCE OF ULCERS  *URINARY PROBLEMS  *BOWEL PROBLEMS  UNUSUAL RASH Items with * indicate a potential emergency and should be followed up as soon as possible.  Feel free to call the clinic you have any questions or concerns. The clinic phone number is [phone].  Please show the CHEMO ALERT CARD at check-in to the Emergency Department and triage nurse.  

## 2015-10-05 NOTE — Telephone Encounter (Signed)
-----   Message from Gordy Levan, MD sent at 10/05/2015 10:21 AM EDT ----- Please let patient know PET ordered and should be fine to get it per [facility] managed care.   Please put this into chart note when you speak with pateint:  Gwinda Maine, MD    No Josem Kaufmann needed (Medicare & BCBS supp) Thank you!     Previous Messages    ----- Message -----   From: Gordy Levan, MD   Sent: 10/05/2015  9:18 AM    To: Gaspar Bidding   I have ordered restaging PET for ~ 6-26. Please let me know if not able to get this  Thanks  Lennis

## 2015-10-05 NOTE — Telephone Encounter (Signed)
-----   Message from Gordy Levan, MD sent at 10/05/2015  9:15 AM EDT ----- #30 hemocyte with 3 RF if done as script (I believe I left off quantity in last message)

## 2015-10-05 NOTE — Telephone Encounter (Signed)
appt made and avs printed °

## 2015-10-06 ENCOUNTER — Ambulatory Visit (HOSPITAL_BASED_OUTPATIENT_CLINIC_OR_DEPARTMENT_OTHER): Payer: Medicare Other

## 2015-10-06 VITALS — BP 146/68 | HR 66 | Temp 97.9°F | Resp 18

## 2015-10-06 DIAGNOSIS — Z95828 Presence of other vascular implants and grafts: Secondary | ICD-10-CM

## 2015-10-06 DIAGNOSIS — D701 Agranulocytosis secondary to cancer chemotherapy: Secondary | ICD-10-CM

## 2015-10-06 DIAGNOSIS — T451X5A Adverse effect of antineoplastic and immunosuppressive drugs, initial encounter: Principal | ICD-10-CM

## 2015-10-06 DIAGNOSIS — C7951 Secondary malignant neoplasm of bone: Secondary | ICD-10-CM

## 2015-10-06 MED ORDER — TBO-FILGRASTIM 480 MCG/0.8ML ~~LOC~~ SOSY
480.0000 ug | PREFILLED_SYRINGE | Freq: Once | SUBCUTANEOUS | Status: AC
  Administered 2015-10-06: 480 ug via SUBCUTANEOUS
  Filled 2015-10-06: qty 0.8

## 2015-10-06 MED ORDER — ALTEPLASE 2 MG IJ SOLR
2.0000 mg | Freq: Once | INTRAMUSCULAR | Status: DC | PRN
  Filled 2015-10-06: qty 2

## 2015-10-06 NOTE — Patient Instructions (Signed)

## 2015-10-08 ENCOUNTER — Other Ambulatory Visit: Payer: Self-pay | Admitting: Oncology

## 2015-10-08 DIAGNOSIS — R922 Inconclusive mammogram: Secondary | ICD-10-CM

## 2015-10-08 DIAGNOSIS — G620 Drug-induced polyneuropathy: Secondary | ICD-10-CM | POA: Insufficient documentation

## 2015-10-08 DIAGNOSIS — Z1239 Encounter for other screening for malignant neoplasm of breast: Secondary | ICD-10-CM

## 2015-10-08 DIAGNOSIS — T451X5A Adverse effect of antineoplastic and immunosuppressive drugs, initial encounter: Secondary | ICD-10-CM

## 2015-10-10 ENCOUNTER — Other Ambulatory Visit: Payer: Self-pay | Admitting: Oncology

## 2015-10-10 DIAGNOSIS — Z1231 Encounter for screening mammogram for malignant neoplasm of breast: Secondary | ICD-10-CM

## 2015-10-10 DIAGNOSIS — Z853 Personal history of malignant neoplasm of breast: Secondary | ICD-10-CM

## 2015-10-12 ENCOUNTER — Ambulatory Visit: Payer: Medicare Other | Admitting: Oncology

## 2015-10-12 ENCOUNTER — Other Ambulatory Visit: Payer: Medicare Other

## 2015-10-18 ENCOUNTER — Telehealth: Payer: Self-pay

## 2015-10-18 DIAGNOSIS — C541 Malignant neoplasm of endometrium: Secondary | ICD-10-CM

## 2015-10-18 NOTE — Telephone Encounter (Signed)
Pt dropped off a missive stating she is wanting a lab appt maybe on Friday or early next week to have her Hgb checked. The cout was low in lab 2 weeks ago. She has been taking iron for almost 2 weeks but would lilke to discontinue it if her count is now higher b/c of side effects of constipation, gas and bloating.

## 2015-10-18 NOTE — Addendum Note (Signed)
Addended by: Janace Hoard on: 10/18/2015 03:54 PM   Modules accepted: Orders

## 2015-10-18 NOTE — Telephone Encounter (Signed)
S/w husband per Dr Edwyna Shell attached message. Will put in POF and orders for lab draw Thursday afternoon.

## 2015-10-18 NOTE — Telephone Encounter (Signed)
Fine to get CBC and iron/ TIBC/ ferritin  whichever day she prefers - please put in POF and orders   Need to watch for results and let her know If she wants to stop oral iron until we see results, that is fine We could give IV feraheme instear of oral iron if iron very low Thank you Caden Fatica

## 2015-10-19 ENCOUNTER — Ambulatory Visit (HOSPITAL_BASED_OUTPATIENT_CLINIC_OR_DEPARTMENT_OTHER): Payer: Medicare Other

## 2015-10-19 ENCOUNTER — Telehealth: Payer: Self-pay | Admitting: Oncology

## 2015-10-19 DIAGNOSIS — Z95828 Presence of other vascular implants and grafts: Secondary | ICD-10-CM

## 2015-10-19 DIAGNOSIS — C541 Malignant neoplasm of endometrium: Secondary | ICD-10-CM | POA: Diagnosis not present

## 2015-10-19 DIAGNOSIS — C7951 Secondary malignant neoplasm of bone: Secondary | ICD-10-CM

## 2015-10-19 LAB — COMPREHENSIVE METABOLIC PANEL
ALT: 17 U/L (ref 0–55)
AST: 26 U/L (ref 5–34)
Albumin: 3.6 g/dL (ref 3.5–5.0)
Alkaline Phosphatase: 49 U/L (ref 40–150)
Anion Gap: 9 mEq/L (ref 3–11)
BUN: 19.3 mg/dL (ref 7.0–26.0)
CO2: 26 mEq/L (ref 22–29)
Calcium: 9.3 mg/dL (ref 8.4–10.4)
Chloride: 107 mEq/L (ref 98–109)
Creatinine: 0.8 mg/dL (ref 0.6–1.1)
EGFR: 76 mL/min/{1.73_m2} — ABNORMAL LOW (ref >90)
Glucose: 110 mg/dl (ref 70–140)
Potassium: 3.8 mEq/L (ref 3.5–5.1)
Sodium: 142 mEq/L (ref 136–145)
Total Bilirubin: 0.65 mg/dL (ref 0.20–1.20)
Total Protein: 6.5 g/dL (ref 6.4–8.3)

## 2015-10-19 LAB — CBC WITH DIFFERENTIAL/PLATELET
BASO%: 1 % (ref 0.0–2.0)
Basophils Absolute: 0 10*3/uL (ref 0.0–0.1)
EOS%: 1.5 % (ref 0.0–7.0)
Eosinophils Absolute: 0.1 10*3/uL (ref 0.0–0.5)
HCT: 30.7 % — ABNORMAL LOW (ref 34.8–46.6)
HGB: 10.1 g/dL — ABNORMAL LOW (ref 11.6–15.9)
LYMPH%: 24.3 % (ref 14.0–49.7)
MCH: 33.1 pg (ref 25.1–34.0)
MCHC: 33 g/dL (ref 31.5–36.0)
MCV: 100.1 fL (ref 79.5–101.0)
MONO#: 0.5 10*3/uL (ref 0.1–0.9)
MONO%: 13.2 % (ref 0.0–14.0)
NEUT#: 2.3 10*3/uL (ref 1.5–6.5)
NEUT%: 60 % (ref 38.4–76.8)
Platelets: 163 10*3/uL (ref 145–400)
RBC: 3.07 10*6/uL — ABNORMAL LOW (ref 3.70–5.45)
RDW: 17.1 % — ABNORMAL HIGH (ref 11.2–14.5)
WBC: 3.8 10*3/uL — ABNORMAL LOW (ref 3.9–10.3)
lymph#: 0.9 10*3/uL (ref 0.9–3.3)

## 2015-10-19 MED ORDER — HEPARIN SOD (PORK) LOCK FLUSH 100 UNIT/ML IV SOLN
500.0000 [IU] | Freq: Once | INTRAVENOUS | Status: AC | PRN
  Administered 2015-10-19: 500 [IU] via INTRAVENOUS
  Filled 2015-10-19: qty 5

## 2015-10-19 MED ORDER — SODIUM CHLORIDE 0.9 % IJ SOLN
10.0000 mL | INTRAMUSCULAR | Status: DC | PRN
  Administered 2015-10-19: 10 mL via INTRAVENOUS
  Filled 2015-10-19: qty 10

## 2015-10-19 NOTE — Telephone Encounter (Signed)
lvm to inform patient of lab appt today at 2 pm

## 2015-10-19 NOTE — Telephone Encounter (Signed)
Spoke with Scheduler Vonte to schedule patient for lab this afternoon.  She scheduled her for 2 pm.  Charlies Constable will notify Ms. Hollie Salk of appointment time.

## 2015-10-19 NOTE — Patient Instructions (Signed)

## 2015-10-20 ENCOUNTER — Telehealth: Payer: Self-pay

## 2015-10-20 LAB — FERRITIN: Ferritin: 71 ng/ml (ref 9–269)

## 2015-10-20 LAB — IRON AND TIBC
%SAT: 20 % — ABNORMAL LOW (ref 21–57)
Iron: 59 ug/dL (ref 41–142)
TIBC: 288 ug/dL (ref 236–444)
UIBC: 229 ug/dL (ref 120–384)

## 2015-10-20 NOTE — Telephone Encounter (Signed)
Told Ms. Sigala the results of the Iron studies as noted below by Dr. Marko Plume.

## 2015-10-20 NOTE — Telephone Encounter (Signed)
-----   Message from Gordy Levan, MD sent at 10/20/2015  3:09 PM EDT ----- Labs seen and need follow up: please let her know iron studies low normal. OK to hold off on oral iron for now

## 2015-11-06 ENCOUNTER — Ambulatory Visit
Admission: RE | Admit: 2015-11-06 | Discharge: 2015-11-06 | Disposition: A | Discharge status: Home / Self Care | Payer: Medicare Other | Source: Ambulatory Visit | Attending: Oncology | Admitting: Oncology

## 2015-11-06 DIAGNOSIS — I700 Atherosclerosis of aorta: Secondary | ICD-10-CM | POA: Diagnosis not present

## 2015-11-06 DIAGNOSIS — R911 Solitary pulmonary nodule: Secondary | ICD-10-CM | POA: Insufficient documentation

## 2015-11-06 DIAGNOSIS — C549 Malignant neoplasm of corpus uteri, unspecified: Secondary | ICD-10-CM | POA: Diagnosis not present

## 2015-11-06 DIAGNOSIS — K5730 Diverticulosis of large intestine without perforation or abscess without bleeding: Secondary | ICD-10-CM | POA: Insufficient documentation

## 2015-11-06 DIAGNOSIS — E042 Nontoxic multinodular goiter: Secondary | ICD-10-CM | POA: Diagnosis not present

## 2015-11-06 DIAGNOSIS — C7951 Secondary malignant neoplasm of bone: Secondary | ICD-10-CM | POA: Diagnosis not present

## 2015-11-06 DIAGNOSIS — I2510 Atherosclerotic heart disease of native coronary artery without angina pectoris: Secondary | ICD-10-CM | POA: Diagnosis not present

## 2015-11-06 DIAGNOSIS — C541 Malignant neoplasm of endometrium: Secondary | ICD-10-CM | POA: Diagnosis not present

## 2015-11-06 DIAGNOSIS — K8020 Calculus of gallbladder without cholecystitis without obstruction: Secondary | ICD-10-CM | POA: Diagnosis not present

## 2015-11-06 LAB — GLUCOSE, CAPILLARY: Glucose-Capillary: 93 mg/dL (ref 65–99)

## 2015-11-06 MED ORDER — FLUDEOXYGLUCOSE F - 18 (FDG) INJECTION
5.8000 | Freq: Once | INTRAVENOUS | Status: AC | PRN
  Administered 2015-11-06: 5.8 via INTRAVENOUS

## 2015-11-08 ENCOUNTER — Encounter: Payer: Self-pay | Admitting: Gynecologic Oncology

## 2015-11-08 ENCOUNTER — Other Ambulatory Visit (HOSPITAL_COMMUNITY)
Admission: RE | Admit: 2015-11-08 | Discharge: 2015-11-08 | Disposition: A | Discharge status: Home / Self Care | Payer: Medicare Other | Source: Ambulatory Visit | Attending: Gynecologic Oncology | Admitting: Gynecologic Oncology

## 2015-11-08 ENCOUNTER — Telehealth: Payer: Self-pay

## 2015-11-08 ENCOUNTER — Other Ambulatory Visit: Payer: Medicare Other

## 2015-11-08 ENCOUNTER — Ambulatory Visit: Payer: Medicare Other | Attending: Gynecologic Oncology | Admitting: Gynecologic Oncology

## 2015-11-08 VITALS — BP 159/74 | HR 76 | Temp 98.1°F | Resp 18 | Wt 109.0 lb

## 2015-11-08 DIAGNOSIS — C7951 Secondary malignant neoplasm of bone: Secondary | ICD-10-CM | POA: Diagnosis not present

## 2015-11-08 DIAGNOSIS — C541 Malignant neoplasm of endometrium: Secondary | ICD-10-CM | POA: Insufficient documentation

## 2015-11-08 DIAGNOSIS — E042 Nontoxic multinodular goiter: Secondary | ICD-10-CM | POA: Diagnosis not present

## 2015-11-08 DIAGNOSIS — I2510 Atherosclerotic heart disease of native coronary artery without angina pectoris: Secondary | ICD-10-CM | POA: Diagnosis not present

## 2015-11-08 DIAGNOSIS — I700 Atherosclerosis of aorta: Secondary | ICD-10-CM | POA: Diagnosis not present

## 2015-11-08 DIAGNOSIS — C549 Malignant neoplasm of corpus uteri, unspecified: Secondary | ICD-10-CM | POA: Diagnosis not present

## 2015-11-08 DIAGNOSIS — R911 Solitary pulmonary nodule: Secondary | ICD-10-CM | POA: Diagnosis not present

## 2015-11-08 NOTE — Progress Notes (Signed)
Follow Up Note: Gyn-Onc  [name] 75 y.o. female  CC:  Chief Complaint  Patient presents with  . Follow-up    HPI:  [name] is seen for continuing attention to her recurrent endometrial carcinoma, post cycle 6 chemotherapy on 5/13. Cycle 5 was delayed a week due to counts, then taxol only given on 4-16 due to platelets of only 90K that day; she did have neulasta on 08-30-2011 (then enjoyed 2 weeks at beach). She then had radiation to persistent disease on the left pelvic side wall in 8/13.  History includes microinvasive T1N0 right breast cancer, which was ER/PR positive and HER-2 2+ with insufficient material for FISH at diagnosis in 03/1999, treated with lumpectomy with 2 sentinel node evaluation, local radiation and 5 years of Tamoxifen thru 05/2004, then aromatase inhibitor 07/2004 thru 04/2006.  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 She had 6 cycles of taxol/carboplatin through Jan 2010, and vaginal cuff brachytherapy. She did well until some vague LUQ symptoms in Nov. 2012, with CT AP in [facility] Mar 22, 2011 showing left pelvic sidewall involvement. She was taken to laparoscopic evaluation 04-25-2011, with findings of dense fibrosis in the area as well as mass encasing obturator nerve and vein as well as internal iliac vasculature; left ureterolysis was performed. No pathology was submitted from that procedure. We proceeded with initial 3 additional cycles of taxol/ carboplatin (with carbo skin tests) from 05-16-2011 thru 06-28-2011, then repeated CT AP on 07-17-2011. The CT showed improvement in the left pelvic sidewall mass now 1.9 x 2 cm and 3.4 cm cephalocaudad, as compared with 2.5 x 2.2 cm and 5.4 cm on CT 03-22-11, and no progressive disease elsewhere. She saw me after the CT, with recommendation for an additonal 3 cycles of same chemotherapy  then repeat scan. Cycle 4 was taxol + [place], with carbo skin test and neulasta, then cycle 5 as above only taxol.  She then completed radiation therapy under the care of Dr. Sondra Come which she completed in August of 2013. She had a CT scan on March 23, 2012 that revealed:   IMPRESSION:  1. Today's study demonstrates a positive response to therapy with decreased size of left pelvic sidewall nodal masses, as detailed above. No new soft tissue masses or new lymphadenopathy is identified within the abdomen or pelvis. Continue attention on follow-up studies is recommended.  2. Cholelithiasis without findings to suggest acute cholecystitis.  3. Colonic diverticulosis without findings to suggest acute diverticulitis at this time.  4. Mild asymmetric urinary bladder wall thickening is unchanged compared to prior examinations and is nonspecific. No definite bladder wall mass is identified at this time.  5. Additional findings, similar to prior examinations, as above.   There had been another lymph node identified on her CT scan in may that measured 2.1 x 1.7 cm. In reviewing her CT report it states that the nodal mass higher up along the pelvic sidewalls is no longer clearly identified although there continues to be some amorphus soft tissue in the region with the previously noted nodal mass resided. This was confirmed with the reading radiologist and communicated to the patient.   CT 06/22/12:  Normal urinary bladder. Hysterectomy. Decreased size of the left pelvic peripherally enhancing lesion. This measures 1.8 x 1.7 cm  on image 55/series 2 versus 2.4 x 2.1 cm on the prior exam. No  new lesions are identified. No significant free fluid.  Bones/Musculoskeletal: No acute osseous abnormality. Trace L4-L5 anterolisthesis is likely degenerative. There is S-shaped lumbar  spine curvature.  IMPRESSION:  1. Further decreased size of the left pelvic peripherally enhancing lesion.  2. No new sites of new or  progressive disease.  3. Esophageal air fluid level suggests dysmotility or gastroesophageal reflux.  4. Cholelithiasis.   CT 11/02/2012:  The index lesion within the left pelvic side wall measures 2.2 x 2.7 cm, image 55/series 2. Previously this measured 1.8 x 1.7 cm. No new lesions identified. The stomach and small bowel loops appear within normal limits. Normal appearance of the proximal colon. Multiple sigmoid diverticula identified without acute inflammation. Review of the visualized bony structures shows scoliosis and multilevel degenerative disc disease within the lumbar spine.  IMPRESSION:  1. The left pelvic side wall lesion demonstrates mild increase in size from previous exam.  2. No new sites of new or progressive disease.  3. Cholelithiasis.   CT 01/28/13: FINDINGS:  Lung bases are clear. No focal hepatic lesion. The gallbladder, pancreas, spleen, adrenal glands, and kidneys are normal. There are several gallstones within the lumen of the gallbladder. The stomach, small bowel, and cecum are normal. The diverticulum sigmoid colon. Rectum is normal. Abdominal aorta is normal in caliber. No retroperitoneal or periportal lymphadenopathy. No evidence of peritoneal disease. Within the pelvis, the left pelvic sidewall lobular enhancing mass which is slightly increased in size. The lesion is similar in axial dimension measuring 30 x 21 mm compared to the 29 x 22 mm on prior remeasured. In the craniocaudad dimension, lesion measures 26 mm 1ncreased from 21 mm on prior. No additional pelvic lymphadenopathy. Hysterectomy anatomy. Vaginal cuff appears normal. Bladder is normal.  Review of bone windows demonstrates no aggressive osseous lesions.  IMPRESSION:  1. Interval small increase in volume of enhancing mass adjacent to the left pelvic sidewall.  2. No evidence of abdominal or pelvic lymphadenopathy. 75 year old with recurrent uterine papillary serous carcinoma initially stage IB diagnosed in  2009. Most recent imaging in 01/2012 of this year reveals interval enlargement of her left pelvic sidewall mass consistent with nodal disease.   PET 03/10/13: 1. Hypermetabolic mass in the deep left pelvis consistent with metastasis.  2. No additional evidence of local metastasis. No evidence of distant metastasis.  3. Small right lower lobe pulmonary nodule is not hypermetabolic. Recommend attention on follow-up.  She underwent resection of pelvic mass, left sided ureterolysis, intra-operative radiation therapy by radiation oncology, omental J flap, and left ureteral stent placement by urology at [facility]  on 04/02/13.  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  Final pathology resulted:  Omentum, biopsy: Mature adipose tissue, fibrous tissue, calcifications and bland appearing gland consistent with endosalpingiosis.  No diagnostic carcinoma identified, pancytokeratin confirmatory (negative).  Lymph nodes, left obturator, regional resection: Aggregate of lymph node (4.1 cm) involved by poorly differentiated high grade carcinoma (see Light Microscopy).  Abundant tumor necrosis.   She had her cystoscopy and ureteral stent removal on 05/20/2013. She had a CT scan March 17 that revealed no evidence of recurrent disease. She comes in today for interval followup.  She had a CT scan performed on 02/05/14: IMPRESSION:  No CT findings for recurrent or metastatic disease involving the abdomen/ pelvis.  She had been  having increasing issues over the past 3-4 weeks with increasing back pain. She has been told she has DJD and in the past this pain has been relieved with stress dose steroids, but this time it has not helped.   MRI lumbar spine at [facility] on 06-13-2014 showed known scoliosis and degenerative disc  disease, with new finding of 2.7 x 2.4 x 1.9 cm soft tissue mass at L3-4 involving left neural foramen, with focal cortical destruction of adjacent L3 vertebral body and portion of left L4 pedicle. She had CT biopsy on 06-21-14, with pathology ([id number]) poorly differentiated carcinoma consistent with high grade serous. Bone scan 06-22-14 had uptake posteriorly in upper lumbar spine and a subtle increased area of uptake right greater trochanter of unclear significance. IMRT by Dr Sondra Come 2-29 thru 08-12-14. First zometa 07-07-14-6/16.  Post-XRT CT scan revealed: IMPRESSION: 1. Since the biopsy study of 06/21/2014, similar to slight decrease in size of a left paraspinous lesion at L3-4. 2. No new sites of metastatic disease identified. 3. 8 mm ground-glass nodule in the right lower lobe is grossly similar to 03/10/2013, suggesting a benign etiology. 4. Cholelithiasis. 5. Apparent sigmoid colonic wall thickening which could be due to underdistention. Colitis felt less likely. 6. Atherosclerosis, including within the coronary arteries. 7. Pelvic floor laxity.  CT 10/07/14: Musculoskeletal: Stable to slight interval decrease in size of left paraspinous lesion at the L3-4 level with peripheral enhancement measuring 1.6 x 1.7 cm (image 72; series 2), previously 2.0 x 1.8 cm.  IMPRESSION: Stable to slight interval decrease in size of left paraspinous lesion at L3-4.  No new sites of metastatic disease.  01/19/15: The left paraspinous lesion is again noted. Today this measures 11 mm, image 70/ series 2. Previously 1.7 cm. There are no new bone lesions identified.  IMPRESSION: 1. Decrease in size of left paraspinous metastasis. 2. No new sites of disease. 3. Stable ground-glass attenuating nodule within the superior segment of right lower lobe. 4. Gallstones.  PET 05/11/15: IMPRESSION: 1. The small soft tissue mass just lateral to the left L3 for neural foramen is hypermetabolic, favoring  malignancy. 2. There is also a small focus of hypermetabolic activity between the left L1 and L2 transverse processes, but without a CT correlate. 3. The 13 mm sub solid nodule in the superior segment right lower lobe is stable from recent exams but has slowly increased in size over the last 7 years. Although not hypermetabolic, the appearance is concerning for the possibility of a low grade adenocarcinoma.  She saw Dr. Roxan Hockey and cardiothoracic surgery in January. They recommended discuss the possibility of stereotactic radiation with Dr. Sondra Come versus follow-up. I have discussed the PET scan results with her. At that time his her head not been significant growth and there is no room to proceed with additional radiation she opted for short interval follow-up as her quality of life is good and she was completely asymptomatic. She comes in today in follow-up with a CT scan of the chest, abdomen and pelvis on 07/11/2015.  IMPRESSION: 1. 13 mm mixed solid and sub solid nodule in the posterior right lower lobe was present in 2009 and has clearly progressed in the interval since that study. Imaging features remain highly concerning for low-grade or well differentiated adenocarcinoma. 2. Slight increase size of in the hypermetabolic, rim enhancing nodule identified adjacent to the left L3-4 neural foramen. Metastatic disease remains a concern. 3. No evidence for discrete soft tissue lesion between the left L1 and 2  transverse processes, the site of focal FDG uptake on the recent PET-CT. 4. Cholelithiasis. 5. Abdominal aortic atherosclerosis.  She subsequently began dose dense carboplatin and paclitaxel on March 20 13,017. She's undergone 3 cycles. She had to have growth factor support. She subsequent underwent a port placement on May 3 secondary to poor IV access. After 3 cycles of chemotherapy she underwent a PET scan which revealed:  IMPRESSION: 1. Hypermetabolic left O9-6 paraspinous metastasis  (biopsy-proven) is stable in size and slightly decreased in metabolism. 2. Previously described small focus of hypermetabolism between the left L2 and L3 spinous processes has resolved. 3. New mild linear hypermetabolism to the left of the T11-12 spinous processes without discrete mass on the CT images, favor benign activity related activity, recommend attention on follow-up PET-CT. 4. No definite new sites of hypermetabolic metastatic disease. No recurrent hypermetabolic metastatic disease in the pelvis. 5. Interval stability of subsolid 1.3 cm superior segment right lower lobe pulmonary nodule without associated significant metabolism, which has grown compared to the 2009 chest CT study, and remain suspicious for low grade adenocarcinoma. 6. Additional findings include aortic atherosclerosis, coronary atherosclerosis, mild multinodular goiter with no hypermetabolic thyroid nodules, cholelithiasis and moderate sigmoid diverticulosis.  She comes in with her husband to discuss these findings. We reviewed the PET scan and the fact that her disease appears stable with potentially some decrease in the hypermetabolism of the lesion. I discussed with them the ideal immunotherapy. We did discuss the possibility of PDL-1 inhibition with pembro. This study did show proximally 40% 6 month progression free survival. This medication isuseful in patients who have PDL-1 + tumors. I have spoken to pathology and they will see if her tumor block from February 16 has been decalcified and if they have enough tumor to run the testing on. If not we could obtain her tissue blocks from [facility] for this purpose. This would merely provide [place] another potential option if she begins to not tolerate standard chemotherapy or it stops working for her.  Review of Systems  Constitutional: Feels well    Current Meds:  Outpatient Encounter Prescriptions as of 11/08/2015  Medication Sig  . ALPRAZolam (XANAX) 0.25 MG tablet Take 0.25 mg by  mouth 2 (two) times daily as needed for anxiety (when flying). Reported on 11/08/2015  . amLODipine (NORVASC) 5 MG tablet Take 5 mg by mouth daily.  Marland Kitchen aspirin EC 81 MG tablet Take 81 mg by mouth daily.  Marland Kitchen atenolol (TENORMIN) 25 MG tablet Take 25 mg by mouth 2 (two) times daily.   . Biotin 5000 MCG TABS Take 5,000 mcg by mouth daily.  . cholecalciferol (VITAMIN D) 1000 UNITS tablet Take 1,000 Units by mouth every Monday, Wednesday, and Friday.  . Cyanocobalamin (VITAMIN B-12) 5000 MCG SUBL Place 1 tablet under the tongue daily.   Marland Kitchen ibuprofen (ADVIL,MOTRIN) 200 MG tablet Take 200-400 mg by mouth 2 (two) times daily as needed.  . Magnesium 250 MG TABS Take 250 mg by mouth every Monday, Wednesday, and Friday.   . Multiple Vitamin (MULTIVITAMIN WITH MINERALS) TABS tablet Take 1 tablet by mouth daily.  . Omega-3 Fatty Acids (FISH OIL PO) Take 360 mg by mouth daily.  . potassium gluconate 595 MG TABS tablet Take 595 mg by mouth every Monday, Wednesday, and Friday.  . simvastatin (ZOCOR) 20 MG tablet Take 20 mg by mouth daily.  Marland Kitchen dexamethasone (DECADRON) 4 MG tablet Take 5 tablets with food 12 hrs prior to Taxol chemotherapy. (Patient not taking: Reported on  11/08/2015)  . ferrous fumarate (HEMOCYTE - 106 MG FE) 325 (106 Fe) MG TABS tablet Take 1 tablet (106 mg of iron total) by mouth daily. (Patient not taking: Reported on 11/08/2015)  . lidocaine-prilocaine (EMLA) cream Apply to [facility] cath 1-2 hours prior to access as directed (Patient not taking: Reported on 11/08/2015)  . polyethylene glycol (MIRALAX / GLYCOLAX) packet Take 17 g by mouth daily as needed for mild constipation. Reported on 11/08/2015  . [DISCONTINUED] ofloxacin (FLOXIN OTIC) 0.3 % otic solution Place 10 drops into the right ear 2 (two) times daily. For 7 days   No facility-administered encounter medications on file as of 11/08/2015.    Allergy:  Allergies  Allergen Reactions  . Ace Inhibitors Anaphylaxis  . Codeine Nausea Only  .  Demerol Nausea Only  . Dilaudid [Hydromorphone Hcl] Other (See Comments)    "total loss of her mind"    Social Hx:   Social History   Social History  . Marital Status: Married    Spouse Name: N/A  . Number of Children: N/A  . Years of Education: N/A   Occupational History  . Not on file.   Social History Main Topics  . Smoking status: Former Smoker    Types: Cigarettes    Quit date: 05/13/1962  . Smokeless tobacco: Never Used  . Alcohol Use: Yes  . Drug Use: No  . Sexual Activity: No   Other Topics Concern  . Not on file   Social History Narrative    Past Surgical Hx:  Past Surgical History  Procedure Laterality Date  . Total abdominal hysterectomy w/ bilateral salpingoophorectomy    . Breast lumpectomy    . Appendectomy    . Tonsillectomy    . Foot surgery      RIGHT    Past Medical Hx:  Past Medical History  Diagnosis Date  . Hypertension   . Palpitations   . History of breast cancer 2000    right breast -- treated with lumpectomy and radiation therarpy, postoperatively with tamoxifen   . Hyperlipidemia   . Hypercholesterolemia   . PVC's (premature ventricular contractions)   . S/P radiation therapy 10/28/11-12/05/11    5040 cGy left pelvis  . S/P radiation therapy     Intracavitary brachytherapy of uterus  . Radiation 07/11/14-08/12/14    left lumbar paraspinal area 55 gray  . Endometrial cancer ([place]) 12/2007    s/p total abdominal hysterectomy at [facility] - ovaries were also removed     Family Hx:  Family History  Problem Relation Age of Onset  . Thrombosis Father     coronary thrombosis  . Hypertension Father   . Heart failure Mother   . Coronary artery disease Brother     Vitals:  Blood pressure 159/74, pulse 76, temperature 98.1 F (36.7 C), temperature source Oral, resp. rate 18, weight 109 lb (49.442 kg), SpO2 99 %.  Physical Exam:  General: Well developed, well nourished female in no acute distress. Alert and oriented x 3.    Assessment/Plan:  75 year old with recurrent uterine papillary serous carcinoma initially stage IB diagnosed in 2009.  She is s/p resection of pelvic mass, left sided ureterolysis, intra-operative radiation therapy by radiation oncology, omental J flap, and left ureteral stent placement by urology at [facility]  on 04/02/13.  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   She then unfortunately suffered a recurrence in the left paraspinal space and was treated with Zometa as well as IMRT. Imaging shows that the lesion was initially smaller but slowly has grown in size. Also pulmonary nodule was identified that has been fairly slow growing. It has definitely grown since 2009. She has been seen by a thoracic surgery and by Dr. Sondra Come Dr. Sondra Come did not feel that there is any more room for radiation therapy for paraspinal mass.  She subsequently has undergone 3 cycles of dose dense carboplatin with a taxine. Her disease appears stable with some potential decrease in the metabolic activity. At this point I believe it is reasonable to continue with the same regimen as per her report she appears to be tolerating it well. She did report increasing neuropathy and fatigue to Dr. Marko Plume but today she states that she may have overstated those symptoms. I did discuss other treatment options do exist for her cancer including some exciting options for immunotherapy. She is thankful that we are moving forward with getting her tumor tested to see if that is helpful as an option for her. We will contact her with the results. She has an appointment with Dr. Marko Plume this coming Monday. If they decide to continue chemotherapy which I think is probably her best option at this time, I would recommend repeat imaging after 3 cycles. She would most likely  benefit from a repeat PET scan as currently that was the only significant change was the change in hypermetabolism. It will also allow [place] to follow the other area of increased hypermetabolism that did not have a CT correlate.  Greater than 40 minutes face to face time was spent with the patient and her husband. All of the time was spent in consultation. Today was her 53rd wedding anniversary and they will thankful for some relatively good news.   [name] A.,  11/08/2015, 3:11 PM

## 2015-11-08 NOTE — Telephone Encounter (Signed)
S/w pt that lab and flush were cancelled today. Dr Alycia Rossetti appt maintained. She will get lab and flush on 7/3 with Dr Mariana Kaufman appt.

## 2015-11-10 ENCOUNTER — Telehealth: Payer: Self-pay | Admitting: Gynecologic Oncology

## 2015-11-10 NOTE — Telephone Encounter (Signed)
Spoke with the patient about path specimen being sent out for PDL-1 testing and informed her of the response from Dr. Skipper Cliche:  Nodule is stable and not active on PET.  Still pretty good likelihood this is a low grade adenocarcinoma (pt aware of this).  As long as she understands this, she does not need to come back and see me.  Recommendation for another scan in 3-4 months.  Patient verbalizing understanding.  No questions voiced.

## 2015-11-12 ENCOUNTER — Other Ambulatory Visit: Payer: Self-pay | Admitting: Oncology

## 2015-11-12 DIAGNOSIS — C541 Malignant neoplasm of endometrium: Secondary | ICD-10-CM

## 2015-11-13 ENCOUNTER — Other Ambulatory Visit: Payer: Self-pay | Admitting: Oncology

## 2015-11-13 ENCOUNTER — Ambulatory Visit (HOSPITAL_BASED_OUTPATIENT_CLINIC_OR_DEPARTMENT_OTHER): Payer: Medicare Other | Admitting: Oncology

## 2015-11-13 ENCOUNTER — Other Ambulatory Visit (HOSPITAL_BASED_OUTPATIENT_CLINIC_OR_DEPARTMENT_OTHER): Payer: Medicare Other

## 2015-11-13 ENCOUNTER — Telehealth: Payer: Self-pay | Admitting: Oncology

## 2015-11-13 ENCOUNTER — Ambulatory Visit (HOSPITAL_BASED_OUTPATIENT_CLINIC_OR_DEPARTMENT_OTHER): Payer: Medicare Other

## 2015-11-13 VITALS — BP 146/65 | HR 60 | Temp 98.2°F | Resp 18 | Ht 59.0 in | Wt 109.9 lb

## 2015-11-13 DIAGNOSIS — D701 Agranulocytosis secondary to cancer chemotherapy: Secondary | ICD-10-CM

## 2015-11-13 DIAGNOSIS — E739 Lactose intolerance, unspecified: Secondary | ICD-10-CM

## 2015-11-13 DIAGNOSIS — Z853 Personal history of malignant neoplasm of breast: Secondary | ICD-10-CM

## 2015-11-13 DIAGNOSIS — Z95828 Presence of other vascular implants and grafts: Secondary | ICD-10-CM

## 2015-11-13 DIAGNOSIS — T451X5A Adverse effect of antineoplastic and immunosuppressive drugs, initial encounter: Secondary | ICD-10-CM

## 2015-11-13 DIAGNOSIS — C7951 Secondary malignant neoplasm of bone: Secondary | ICD-10-CM

## 2015-11-13 DIAGNOSIS — G620 Drug-induced polyneuropathy: Secondary | ICD-10-CM

## 2015-11-13 DIAGNOSIS — C541 Malignant neoplasm of endometrium: Secondary | ICD-10-CM

## 2015-11-13 DIAGNOSIS — D6481 Anemia due to antineoplastic chemotherapy: Secondary | ICD-10-CM

## 2015-11-13 LAB — CBC WITH DIFFERENTIAL/PLATELET
BASO%: 1.3 % (ref 0.0–2.0)
Basophils Absolute: 0.1 10*3/uL (ref 0.0–0.1)
EOS%: 6.5 % (ref 0.0–7.0)
Eosinophils Absolute: 0.3 10*3/uL (ref 0.0–0.5)
HCT: 34.3 % — ABNORMAL LOW (ref 34.8–46.6)
HGB: 11.3 g/dL — ABNORMAL LOW (ref 11.6–15.9)
LYMPH%: 19.8 % (ref 14.0–49.7)
MCH: 33.9 pg (ref 25.1–34.0)
MCHC: 33.1 g/dL (ref 31.5–36.0)
MCV: 102.4 fL — ABNORMAL HIGH (ref 79.5–101.0)
MONO#: 0.7 10*3/uL (ref 0.1–0.9)
MONO%: 12.9 % (ref 0.0–14.0)
NEUT#: 3 10*3/uL (ref 1.5–6.5)
NEUT%: 59.5 % (ref 38.4–76.8)
Platelets: 176 10*3/uL (ref 145–400)
RBC: 3.35 10*6/uL — ABNORMAL LOW (ref 3.70–5.45)
RDW: 15.7 % — ABNORMAL HIGH (ref 11.2–14.5)
WBC: 5.1 10*3/uL (ref 3.9–10.3)
lymph#: 1 10*3/uL (ref 0.9–3.3)

## 2015-11-13 LAB — COMPREHENSIVE METABOLIC PANEL
ALT: 15 U/L (ref 0–55)
AST: 22 U/L (ref 5–34)
Albumin: 3.4 g/dL — ABNORMAL LOW (ref 3.5–5.0)
Alkaline Phosphatase: 45 U/L (ref 40–150)
Anion Gap: 10 mEq/L (ref 3–11)
BUN: 22.5 mg/dL (ref 7.0–26.0)
CO2: 25 mEq/L (ref 22–29)
Calcium: 9.5 mg/dL (ref 8.4–10.4)
Chloride: 108 mEq/L (ref 98–109)
Creatinine: 0.8 mg/dL (ref 0.6–1.1)
EGFR: 76 mL/min/{1.73_m2} — ABNORMAL LOW (ref >90)
Glucose: 104 mg/dl (ref 70–140)
Potassium: 4.2 mEq/L (ref 3.5–5.1)
Sodium: 143 mEq/L (ref 136–145)
Total Bilirubin: 0.54 mg/dL (ref 0.20–1.20)
Total Protein: 6.4 g/dL (ref 6.4–8.3)

## 2015-11-13 MED ORDER — HEPARIN SOD (PORK) LOCK FLUSH 100 UNIT/ML IV SOLN
500.0000 [IU] | Freq: Once | INTRAVENOUS | Status: AC | PRN
  Administered 2015-11-13: 500 [IU] via INTRAVENOUS
  Filled 2015-11-13: qty 5

## 2015-11-13 MED ORDER — SODIUM CHLORIDE 0.9 % IJ SOLN
10.0000 mL | INTRAMUSCULAR | Status: DC | PRN
  Administered 2015-11-13: 10 mL via INTRAVENOUS
  Filled 2015-11-13: qty 10

## 2015-11-13 NOTE — Patient Instructions (Signed)

## 2015-11-13 NOTE — Telephone Encounter (Signed)
cld pt w/appt on cell # HOME PHONE RECORDING STATED HAVING TECHNICAL DIFFICULTIES. ADV APPT 7/11'@12'$ :45 ADV NO AM APPT AVALIABLE

## 2015-11-14 ENCOUNTER — Encounter: Payer: Self-pay | Admitting: Oncology

## 2015-11-14 DIAGNOSIS — D6481 Anemia due to antineoplastic chemotherapy: Secondary | ICD-10-CM | POA: Insufficient documentation

## 2015-11-14 DIAGNOSIS — E739 Lactose intolerance, unspecified: Secondary | ICD-10-CM | POA: Insufficient documentation

## 2015-11-14 DIAGNOSIS — T451X5A Adverse effect of antineoplastic and immunosuppressive drugs, initial encounter: Secondary | ICD-10-CM

## 2015-11-14 NOTE — Progress Notes (Signed)
OFFICE PROGRESS NOTE   November 13, 2015  Physicians: P.Gehrig, J.Kinard, (T.Brackbill) J.Russo, S.Ganem  INTERVAL HISTORY:  Patient is seen, alone for visit, in continuing attention to metastatic endometrial carcinoma involving lumbar spine for which she has had maximal RT in L3-4 area. She had PET 11-06-15 after 3 cycles of dose dense carbo taxol, with slight improvement in hypermetabolic uptake U2-7 and stable 1.3 cm RLL nodule. She saw Dr Alycia Rossetti 11-08-15, recommendation for 3 additional cycles of chemo as well as testing previous path for PDL-1, for possible pembrolizumab/ Keytruda in future.    Patient has felt progressively more energetic in this short break off of chemotherapy, with no episodes of weakness and walking regularly for exercise. Has needed no pain medication for back in several months, any discomfort mild and seems baseline from scoliosis. Appetite is good. Abdominal bloating and excessive flatus significantly improved in last 3 weeks since she has omitted daily yogurt from diet, bowels moving well daily She has minimal peripheral neuropathy, slightly more noticeable in feet and lower legs since resuming chemo, but not at all bothersome; none in hands. No SOB, no fever, no bleeding, no problems with PAC, no LE swelling, no abdominal or pelvic discomfort. Remainder of 10 point Review of Systems negative  PAC placed by IR on 09-13-15 Biopsy of recurrence 06-2014 ER negative. No genetics testing   ONCOLOGIC HISTORY BREAST CANCER: microinvasive T1N0 right breast cancer which was ER/PR positive and HER-2 2+ with insufficient material for FISH at diagnosis in 03/1999, treated with lumpectomy with 2 sentinel node evaluation, local radiation and 5 years of Tamoxifen thru 05/2004, then aromatase inhibitor 07/2004 thru 04/2006.   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 She had 6 cycles of taxol/carboplatin through Jan 2010, and vaginal cuff brachytherapy. She did well until some vague LUQ symptoms in Nov. 2012, with CT AP in [facility] Mar 22, 2011 showing left pelvic sidewall involvement. She had laparoscopic evaluation at [facility] by Weir 04-25-2011, with finding of dense fibrosis in the area as well as mass encasing obturator nerve and vein as well as internal iliac vasculature; left ureterolysis was performed. No pathology was submitted from that procedure. She received 3 additional cycles of taxol/ carboplatin from 05-16-2011 thru 06-28-2011. CT AP on 07-17-2011 showed improvement in the left pelvic sidewall mass now 1.9 x 2 cm and 3.4 cm cephalocaudad, as compared with 2.5 x 2.2 cm and 5.4 cm on CT 03-22-11, and no progressive disease elsewhere. She saw Dr.Gehrig after the CT, recommendation for an additonal 3 cycles of same chemotherapy . Cycle 4 was taxol + carbo and neulasta, cycle 5 taxol only due to counts and cycle 6 taxol + carboplatin on 09-17-11. Follow up CT in May 2013 showed enlargement of the left pelvic sidewall involvement. She had IMRT 50 GY in 28 fractions by Dr Sondra Come, that completed 12-05-11. She had regular follow up scans, Including CT AP 01-2014 and PET 02-2013, which did not show progressive disease. She has scoliosis and some chronic intermittent low back discomfort, but developed different, persistent discomfort low back and radiating to LLE by ~ Dec into Jan 2016. MRI lumbar spine [name] on 06-13-2014 showed known scoliosis and degenerative disc disease, with new finding of 2.7 x 2.4 x 1.9 cm soft tissue mass at L3-4 involving left neural foramen, with focal cortical destruction of adjacent L3 vertebral body and portion  of left L4 pedicle. She had CT biopsy on 06-21-14, with pathology ([id number]) poorly differentiated carcinoma consistent with high grade serous. Bone scan 06-22-14 had uptake posteriorly in upper lumbar spine and  a subtle increased area of uptake right greater trochanter of unclear significance. IMRT by Dr Sondra Come 2-29 thru 08-12-14. Zometa x4 07-07-14 thru 10-2014. Progression at L1-2 and possible RLL lung nodule found on PET 04-2015 and CT CAP 06-2015. She began dose dense carbo taxol on 08-03-15. She was neutropenic by day 15 cycle 1, ANC 0.5 and granix added. PET 11-06-15,after 3 cycles doses dense carbo taxol , had slight improvement in hypermetabolic uptake at V7-7, no change size/ no hypermetabolism 1.3 cm RLL pulmonary nodule, resolution of L2-3 area and no correlate to slight hypermetabolism at T11-12.  Plan to continue dose dense carbo taxol an additional 3 cycles.    Objective:  Vital signs in last 24 hours:  BP 146/65 mmHg  Pulse 60  Temp(Src) 98.2 F (36.8 C) (Oral)  Resp 18  Ht 4' 11" (1.499 m)  Wt 109 lb 14.4 oz (49.85 kg)  BMI 22.19 kg/m2  SpO2 100% Weight down 1 lb. Alert, oriented and appropriate. Ambulatory without difficulty, quickly and easily mobile. Looks comfortable, very pleasant as always.  No alopecia  HEENT:PERRL, sclerae not icteric. Oral mucosa moist without lesions, posterior pharynx clear.  Neck supple. No JVD.  Lymphatics:no cervical,supraclavicular or inguinal adenopathy Resp: clear to auscultation bilaterally and normal percussion bilaterally Cardio: regular rate and rhythm. No gallop. GI: soft, nontender, not distended, no mass or organomegaly. A few bowel sounds. Surgical incision not remarkable. Musculoskeletal/ Extremities: Obvious scoliosis without point tenderness spine.  Extremities without pitting edema, cords, tenderness Neuro: no significant peripheral neuropathy. Otherwise nonfocal. PSYCH appropriate mood and affect Skin without rash, ecchymosis, petechiae Portacath-without erythema or tenderness  Lab Results:  Results for orders placed or performed in visit on 11/13/15  CBC with Differential  Result Value Ref Range   WBC 5.1 3.9 - 10.3 10e3/uL    NEUT# 3.0 1.5 - 6.5 10e3/uL   HGB 11.3 (L) 11.6 - 15.9 g/dL   HCT 34.3 (L) 34.8 - 46.6 %   Platelets 176 145 - 400 10e3/uL   MCV 102.4 (H) 79.5 - 101.0 fL   MCH 33.9 25.1 - 34.0 pg   MCHC 33.1 31.5 - 36.0 g/dL   RBC 3.35 (L) 3.70 - 5.45 10e6/uL   RDW 15.7 (H) 11.2 - 14.5 %   lymph# 1.0 0.9 - 3.3 10e3/uL   MONO# 0.7 0.1 - 0.9 10e3/uL   Eosinophils Absolute 0.3 0.0 - 0.5 10e3/uL   Basophils Absolute 0.1 0.0 - 0.1 10e3/uL   NEUT% 59.5 38.4 - 76.8 %   LYMPH% 19.8 14.0 - 49.7 %   MONO% 12.9 0.0 - 14.0 %   EOS% 6.5 0.0 - 7.0 %   BASO% 1.3 0.0 - 2.0 %  Comprehensive metabolic panel  Result Value Ref Range   Sodium 143 136 - 145 mEq/L   Potassium 4.2 3.5 - 5.1 mEq/L   Chloride 108 98 - 109 mEq/L   CO2 25 22 - 29 mEq/L   Glucose 104 70 - 140 mg/dl   BUN 22.5 7.0 - 26.0 mg/dL   Creatinine 0.8 0.6 - 1.1 mg/dL   Total Bilirubin 0.54 0.20 - 1.20 mg/dL   Alkaline Phosphatase 45 40 - 150 U/L   AST 22 5 - 34 U/L   ALT 15 0 - 55 U/L   Total Protein 6.4 6.4 -  8.3 g/dL   Albumin 3.4 (L) 3.5 - 5.0 g/dL   Calcium 9.5 8.4 - 10.4 mg/dL   Anion Gap 10 3 - 11 mEq/L   EGFR 76 (L) >90 ml/min/1.73 m2    Hgb up from 9.9 with chemo break, iron studies not too low on 10-19-15 Studies/Results:  EXAM: NUCLEAR MEDICINE PET SKULL BASE TO THIGH  11-06-15   COMPARISON: 07/11/2015 CT chest, abdomen and pelvis. 05/11/2015 PET-CT.  FINDINGS: NECK  No hypermetabolic lymph nodes in the neck. Stable mild multinodular goiter with no hypermetabolic thyroid nodules.  CHEST  No hypermetabolic axillary, mediastinal or hilar nodes. Left internal jugular MediPort terminates at the cavoatrial junction. Left anterior descending, left circumflex and right coronary atherosclerosis. Right axillary surgical clips. No pleural effusions. Atherosclerotic nonaneurysmal thoracic aorta. Subpleural reticulation in the anterior mid to upper right lung is unchanged and consistent with mild radiation fibrosis.  Superior segment right lower lobe subsolid 1.3 x 1.0 cm pulmonary nodule (series 8/image 27) is not associated with significant metabolism and measured 1.3 x 1.0 cm on 07/11/2015 and 0.7 x 0.6 cm on 12/01/2007. No acute consolidative airspace disease or new significant pulmonary nodules.  ABDOMEN/PELVIS  Hypermetabolic 1.7 x 1.7 cm left L3-4 paraspinous mass (series 4/image 118) with max SUV 6.8, which measured 1.7 x 1.7 cm on 07/11/2015 CT, and demonstrated max SUV 7.5 on 05/11/2015 PET-CT, stable in size and slightly decreased in metabolism.  The previously described small focus of hypermetabolism between the left L2 and L3 spinous processes on the 05/11/2015 PET-CT study has resolved.  There is new mild hypermetabolism (max SUV 3.9) in the soft tissues to the left of the T11-12 spinous processes without discrete mass on the CT images, favor benign activity related uptake given the linear configuration of this hypermetabolism on the sagittal sequence.  Segmental hypermetabolism in the cecum and ascending colon without CT correlate is most consistent with physiologic activity.  Hysterectomy. No hypermetabolic mass at the vaginal cuff or within the pelvic sidewalls. No hypermetabolic adnexal masses.  No abnormal hypermetabolic activity within the liver, pancreas, adrenal glands, or spleen. No hypermetabolic lymph nodes in the abdomen or pelvis. Cholelithiasis. Moderate sigmoid diverticulosis. Atherosclerotic nonaneurysmal abdominal aorta.  SKELETON  No focal hypermetabolic activity to suggest skeletal metastasis.  IMPRESSION: 1. Hypermetabolic left W9-7 paraspinous metastasis (biopsy-proven) is stable in size and slightly decreased in metabolism. 2. Previously described small focus of hypermetabolism between the left L2 and L3 spinous processes has resolved. 3. New mild linear hypermetabolism to the left of the T11-12 spinous processes without discrete mass on the  CT images, favor benign activity related activity, recommend attention on follow-up PET-CT. 4. No definite new sites of hypermetabolic metastatic disease. No recurrent hypermetabolic metastatic disease in the pelvis. 5. Interval stability of subsolid 1.3 cm superior segment right lower lobe pulmonary nodule without associated significant metabolism, which has grown compared to the 2009 chest CT study, and remain suspicious for low grade adenocarcinoma. 6. Additional findings include aortic atherosclerosis, coronary atherosclerosis, mild multinodular goiter with no hypermetabolic thyroid nodules, cholelithiasis and moderate sigmoid diverticulosis.   PACs images for PET reviewed with patient at visit.   Medications: I have reviewed the patient's current medications. Refill premed decadron. Mentioned gabapentin for neuropathy symptoms, clarified that this does not improve the underlying problem, patient prefers no meds for now.  DISCUSSION Patient is in agreement with recommendation to continue carbo taxol for additional 3 cycles given partial improvement in PET thus far. We have discussed using granix at  higher 480 mcg dosing, and trying to treat weekly, rather than a skip week after day 15, to allow skip week during at least 1 week of her family beach trip weeks of Aug 14 and 21. Will plan granix at least days 2,3,9,16. She prefers treatments on Tuesdays so that she will hopefully feel better by weekends to be able to go to church.    Improvement in GI symptoms since holding milk products x 3 weeks suggests lactase deficiency; patient may want to test this by trying yogurt again in next day or so (prior to resuming chemo, so that will not complicate symptoms).      Assessment/Plan:  1.Papillary serous endometrial carcinoma IB at diagnosis Aug 2009, recurrent at left pelvic sidewall 03-2011 and progression in L1-2 in spring 2016, now further progression in same lumbar area (+ slighly  enlarged RLL pulmonary nodule which dates back several years) . PET slightly better after 3 cycles of dose dense carbo taxol which she is tolerating, plan to continue another 3 cycles then rescan. Prefers AMs, prefers Tuesdays. Carbo skin tests. Granix support . Try weekly until beach vacation in Aug. PDL-1 testing in process per Dr Alycia Rossetti, question of pembrolizumab later.   2.T1N0 right breast cancer 03-1999: adjuvant therapy included 5 years of tamoxifen. Not recurrent. Last mammograms at [facility] 10-18-14, tomo due to dense breast tissue. Mammograms scheduled 11-17-15 Breast [facility] 3.scoliosis of lumbar spine with degenerative disc disease and chronic symptoms related to this, stable 4.HTN followed by Dr Virgina Jock. She had difficulty with prior chemo with lower BP and tachycardia also, and hypotension day 5 cycle 1. Holding atenolol and amlodipine prn, pushing fluids.  5. flu vaccine done 6.mild anemia multifactorial including chemo anemia. Hgb up over a gram since chemo break, iron not markedly low on 10-19-15. Follow 7.Marland KitchenPAC placed by IR on 09-13-15 8. Chemo peripheral neuropathy feet> hands, slight increase with recent taxol tho does not seem very bothersome now and patient in agreement with continuing that drug. 9.chemo neutropenia on dose dense carbo taxol: granix support 10.possible lactose intolerance: GI symptoms much better off milk products x 3 weeks.  All questions answered and patient is in agreement with recommendations and plans. Chemo and granix orders placed. Time spent 25 min including >50% counseling and coordination of care. Route PCP, cc Dr Cammie Mcgee, MD

## 2015-11-15 ENCOUNTER — Telehealth: Payer: Self-pay

## 2015-11-15 DIAGNOSIS — C541 Malignant neoplasm of endometrium: Secondary | ICD-10-CM

## 2015-11-15 MED ORDER — DEXAMETHASONE 4 MG PO TABS: ORAL_TABLET | ORAL | Status: DC

## 2015-11-15 NOTE — Telephone Encounter (Signed)
-----   Message from Gordy Levan, MD sent at 11/14/2015  4:01 PM EDT ----- Please refill decadron 4 mg  Five tabs = 20 mg with food 12 hrs prior to taxol #30

## 2015-11-15 NOTE — Telephone Encounter (Signed)
lvm re decadron refill

## 2015-11-17 ENCOUNTER — Ambulatory Visit: Payer: Medicare Other

## 2015-11-20 ENCOUNTER — Encounter (HOSPITAL_COMMUNITY): Payer: Self-pay

## 2015-11-21 ENCOUNTER — Ambulatory Visit: Payer: Medicare Other

## 2015-11-21 ENCOUNTER — Other Ambulatory Visit (HOSPITAL_BASED_OUTPATIENT_CLINIC_OR_DEPARTMENT_OTHER): Payer: Medicare Other

## 2015-11-21 ENCOUNTER — Ambulatory Visit (HOSPITAL_BASED_OUTPATIENT_CLINIC_OR_DEPARTMENT_OTHER): Payer: Medicare Other

## 2015-11-21 VITALS — BP 140/65 | HR 65 | Temp 98.0°F | Resp 18

## 2015-11-21 DIAGNOSIS — Z5111 Encounter for antineoplastic chemotherapy: Secondary | ICD-10-CM | POA: Diagnosis present

## 2015-11-21 DIAGNOSIS — C541 Malignant neoplasm of endometrium: Secondary | ICD-10-CM

## 2015-11-21 DIAGNOSIS — Z95828 Presence of other vascular implants and grafts: Secondary | ICD-10-CM

## 2015-11-21 DIAGNOSIS — C7951 Secondary malignant neoplasm of bone: Secondary | ICD-10-CM

## 2015-11-21 DIAGNOSIS — C549 Malignant neoplasm of corpus uteri, unspecified: Secondary | ICD-10-CM

## 2015-11-21 LAB — COMPREHENSIVE METABOLIC PANEL
ALT: 20 U/L (ref 0–55)
AST: 32 U/L (ref 5–34)
Albumin: 3.9 g/dL (ref 3.5–5.0)
Alkaline Phosphatase: 52 U/L (ref 40–150)
Anion Gap: 12 mEq/L — ABNORMAL HIGH (ref 3–11)
BUN: 20 mg/dL (ref 7.0–26.0)
CO2: 24 mEq/L (ref 22–29)
Calcium: 9.7 mg/dL (ref 8.4–10.4)
Chloride: 103 mEq/L (ref 98–109)
Creatinine: 0.8 mg/dL (ref 0.6–1.1)
EGFR: 71 mL/min/{1.73_m2} — ABNORMAL LOW (ref >90)
Glucose: 143 mg/dl — ABNORMAL HIGH (ref 70–140)
Potassium: 4.2 mEq/L (ref 3.5–5.1)
Sodium: 138 mEq/L (ref 136–145)
Total Bilirubin: 0.75 mg/dL (ref 0.20–1.20)
Total Protein: 7.1 g/dL (ref 6.4–8.3)

## 2015-11-21 LAB — CBC WITH DIFFERENTIAL/PLATELET
BASO%: 0 % (ref 0.0–2.0)
Basophils Absolute: 0 10*3/uL (ref 0.0–0.1)
EOS%: 0 % (ref 0.0–7.0)
Eosinophils Absolute: 0 10*3/uL (ref 0.0–0.5)
HCT: 34.5 % — ABNORMAL LOW (ref 34.8–46.6)
HGB: 11.5 g/dL — ABNORMAL LOW (ref 11.6–15.9)
LYMPH%: 7.5 % — ABNORMAL LOW (ref 14.0–49.7)
MCH: 33.4 pg (ref 25.1–34.0)
MCHC: 33.3 g/dL (ref 31.5–36.0)
MCV: 100.3 fL (ref 79.5–101.0)
MONO#: 0.1 10*3/uL (ref 0.1–0.9)
MONO%: 0.9 % (ref 0.0–14.0)
NEUT#: 6 10*3/uL (ref 1.5–6.5)
NEUT%: 91.6 % — ABNORMAL HIGH (ref 38.4–76.8)
Platelets: 199 10*3/uL (ref 145–400)
RBC: 3.44 10*6/uL — ABNORMAL LOW (ref 3.70–5.45)
RDW: 13.9 % (ref 11.2–14.5)
WBC: 6.5 10*3/uL (ref 3.9–10.3)
lymph#: 0.5 10*3/uL — ABNORMAL LOW (ref 0.9–3.3)

## 2015-11-21 MED ORDER — DIPHENHYDRAMINE HCL 50 MG/ML IJ SOLN
INTRAMUSCULAR | Status: AC
Start: 2015-11-21 — End: 2015-11-21
  Filled 2015-11-21: qty 1

## 2015-11-21 MED ORDER — DIPHENHYDRAMINE HCL 50 MG/ML IJ SOLN
12.5000 mg | Freq: Once | INTRAMUSCULAR | Status: AC
  Administered 2015-11-21: 12.5 mg via INTRAVENOUS

## 2015-11-21 MED ORDER — SODIUM CHLORIDE 0.9 % IJ SOLN
10.0000 mL | INTRAMUSCULAR | Status: DC | PRN
  Administered 2015-11-21: 10 mL via INTRAVENOUS
  Filled 2015-11-21: qty 10

## 2015-11-21 MED ORDER — SODIUM CHLORIDE 0.9 % IV SOLN
Freq: Once | INTRAVENOUS | Status: AC
  Administered 2015-11-21: 16:00:00 via INTRAVENOUS

## 2015-11-21 MED ORDER — CARBOPLATIN CHEMO INTRADERMAL TEST DOSE 100MCG/0.02ML
100.0000 ug | Freq: Once | INTRADERMAL | Status: AC
  Administered 2015-11-21: 100 ug via INTRADERMAL
  Filled 2015-11-21: qty 0.01

## 2015-11-21 MED ORDER — SODIUM CHLORIDE 0.9% FLUSH
10.0000 mL | INTRAVENOUS | Status: DC | PRN
  Administered 2015-11-21: 10 mL
  Filled 2015-11-21: qty 10

## 2015-11-21 MED ORDER — FAMOTIDINE IN NACL 20-0.9 MG/50ML-% IV SOLN
20.0000 mg | Freq: Once | INTRAVENOUS | Status: AC
  Administered 2015-11-21: 20 mg via INTRAVENOUS

## 2015-11-21 MED ORDER — SODIUM CHLORIDE 0.9 % IV SOLN
254.4000 mg | Freq: Once | INTRAVENOUS | Status: AC
  Administered 2015-11-21: 250 mg via INTRAVENOUS
  Filled 2015-11-21: qty 25

## 2015-11-21 MED ORDER — HEPARIN SOD (PORK) LOCK FLUSH 100 UNIT/ML IV SOLN
500.0000 [IU] | Freq: Once | INTRAVENOUS | Status: AC | PRN
  Administered 2015-11-21: 500 [IU]
  Filled 2015-11-21: qty 5

## 2015-11-21 MED ORDER — FAMOTIDINE IN NACL 20-0.9 MG/50ML-% IV SOLN
INTRAVENOUS | Status: AC
  Filled 2015-11-21: qty 50

## 2015-11-21 MED ORDER — DEXAMETHASONE SODIUM PHOSPHATE 100 MG/10ML IJ SOLN
Freq: Once | INTRAMUSCULAR | Status: AC
  Administered 2015-11-21: 16:00:00 via INTRAVENOUS
  Filled 2015-11-21: qty 8

## 2015-11-21 MED ORDER — SODIUM CHLORIDE 0.9 % IV SOLN
80.0000 mg/m2 | Freq: Once | INTRAVENOUS | Status: AC
  Administered 2015-11-21: 114 mg via INTRAVENOUS
  Filled 2015-11-21: qty 19

## 2015-11-21 NOTE — Patient Instructions (Signed)

## 2015-11-21 NOTE — Patient Instructions (Signed)
[facility] Discharge Instructions for Patients Receiving Chemotherapy  Today you received the following chemotherapy agents:  Carboplatin, Taxol  To help prevent nausea and vomiting after your treatment, we encourage you to take your nausea medication as prescribed.   If you develop nausea and vomiting that is not controlled by your nausea medication, call the clinic.   BELOW ARE SYMPTOMS THAT SHOULD BE REPORTED IMMEDIATELY:  *FEVER GREATER THAN 100.5 F  *CHILLS WITH OR WITHOUT FEVER  NAUSEA AND VOMITING THAT IS NOT CONTROLLED WITH YOUR NAUSEA MEDICATION  *UNUSUAL SHORTNESS OF BREATH  *UNUSUAL BRUISING OR BLEEDING  TENDERNESS IN MOUTH AND THROAT WITH OR WITHOUT PRESENCE OF ULCERS  *URINARY PROBLEMS  *BOWEL PROBLEMS  UNUSUAL RASH Items with * indicate a potential emergency and should be followed up as soon as possible.  Feel free to call the clinic you have any questions or concerns. The clinic phone number is [phone].  Please show the CHEMO ALERT CARD at check-in to the Emergency Department and triage nurse.   

## 2015-11-22 ENCOUNTER — Ambulatory Visit (HOSPITAL_BASED_OUTPATIENT_CLINIC_OR_DEPARTMENT_OTHER): Payer: Medicare Other

## 2015-11-22 VITALS — BP 135/56 | HR 59 | Temp 98.3°F | Resp 20

## 2015-11-22 DIAGNOSIS — Z95828 Presence of other vascular implants and grafts: Secondary | ICD-10-CM

## 2015-11-22 DIAGNOSIS — D701 Agranulocytosis secondary to cancer chemotherapy: Secondary | ICD-10-CM | POA: Diagnosis present

## 2015-11-22 DIAGNOSIS — C7951 Secondary malignant neoplasm of bone: Secondary | ICD-10-CM

## 2015-11-22 DIAGNOSIS — T451X5A Adverse effect of antineoplastic and immunosuppressive drugs, initial encounter: Secondary | ICD-10-CM

## 2015-11-22 MED ORDER — TBO-FILGRASTIM 480 MCG/0.8ML ~~LOC~~ SOSY
480.0000 ug | PREFILLED_SYRINGE | Freq: Once | SUBCUTANEOUS | Status: AC
  Administered 2015-11-22: 480 ug via SUBCUTANEOUS
  Filled 2015-11-22: qty 0.8

## 2015-11-22 MED ORDER — ALTEPLASE 2 MG IJ SOLR
2.0000 mg | Freq: Once | INTRAMUSCULAR | Status: DC | PRN
  Filled 2015-11-22: qty 2

## 2015-11-22 NOTE — Patient Instructions (Signed)

## 2015-11-23 ENCOUNTER — Ambulatory Visit (HOSPITAL_BASED_OUTPATIENT_CLINIC_OR_DEPARTMENT_OTHER): Payer: Medicare Other

## 2015-11-23 VITALS — BP 138/70 | HR 59 | Temp 98.4°F | Resp 18

## 2015-11-23 DIAGNOSIS — D701 Agranulocytosis secondary to cancer chemotherapy: Secondary | ICD-10-CM

## 2015-11-23 DIAGNOSIS — Z95828 Presence of other vascular implants and grafts: Secondary | ICD-10-CM

## 2015-11-23 DIAGNOSIS — T451X5A Adverse effect of antineoplastic and immunosuppressive drugs, initial encounter: Secondary | ICD-10-CM

## 2015-11-23 DIAGNOSIS — C7951 Secondary malignant neoplasm of bone: Secondary | ICD-10-CM | POA: Diagnosis not present

## 2015-11-23 MED ORDER — TBO-FILGRASTIM 480 MCG/0.8ML ~~LOC~~ SOSY
480.0000 ug | PREFILLED_SYRINGE | Freq: Once | SUBCUTANEOUS | Status: AC
  Administered 2015-11-23: 480 ug via SUBCUTANEOUS
  Filled 2015-11-23: qty 0.8

## 2015-11-23 NOTE — Patient Instructions (Signed)
Pegfilgrastim injection What is this medicine? PEGFILGRASTIM (PEG fil gra stim) is a long-acting granulocyte colony-stimulating factor that stimulates the growth of neutrophils, a type of white blood cell important in the body's fight against infection. It is used to reduce the incidence of fever and infection in patients with certain types of cancer who are receiving chemotherapy that affects the bone marrow, and to increase survival after being exposed to high doses of radiation. This medicine may be used for other purposes; ask your health care provider or pharmacist if you have questions. What should I tell my health care provider before I take this medicine? They need to know if you have any of these conditions: -kidney disease -latex allergy -ongoing radiation therapy -sickle cell disease -skin reactions to acrylic adhesives (On-Body Injector only) -an unusual or allergic reaction to pegfilgrastim, filgrastim, other medicines, foods, dyes, or preservatives -pregnant or trying to get pregnant -breast-feeding How should I use this medicine? This medicine is for injection under the skin. If you get this medicine at home, you will be taught how to prepare and give the pre-filled syringe or how to use the On-body Injector. Refer to the patient Instructions for Use for detailed instructions. Use exactly as directed. Take your medicine at regular intervals. Do not take your medicine more often than directed. It is important that you put your used needles and syringes in a special sharps container. Do not put them in a trash can. If you do not have a sharps container, call your pharmacist or healthcare provider to get one. Talk to your pediatrician regarding the use of this medicine in children. While this drug may be prescribed for selected conditions, precautions do apply. Overdosage: If you think you have taken too much of this medicine contact a poison control center or emergency room at  once. NOTE: This medicine is only for you. Do not share this medicine with others. What if I miss a dose? It is important not to miss your dose. Call your doctor or health care professional if you miss your dose. If you miss a dose due to an On-body Injector failure or leakage, a new dose should be administered as soon as possible using a single prefilled syringe for manual use. What may interact with this medicine? Interactions have not been studied. Give your health care provider a list of all the medicines, herbs, non-prescription drugs, or dietary supplements you use. Also tell them if you smoke, drink alcohol, or use illegal drugs. Some items may interact with your medicine. This list may not describe all possible interactions. Give your health care provider a list of all the medicines, herbs, non-prescription drugs, or dietary supplements you use. Also tell them if you smoke, drink alcohol, or use illegal drugs. Some items may interact with your medicine. What should I watch for while using this medicine? You may need blood work done while you are taking this medicine. If you are going to need a MRI, CT scan, or other procedure, tell your doctor that you are using this medicine (On-Body Injector only). What side effects may I notice from receiving this medicine? Side effects that you should report to your doctor or health care professional as soon as possible: -allergic reactions like skin rash, itching or hives, swelling of the face, lips, or tongue -dizziness -fever -pain, redness, or irritation at site where injected -pinpoint red spots on the skin -red or dark-brown urine -shortness of breath or breathing problems -stomach or side pain, or pain   at the shoulder -swelling -tiredness -trouble passing urine or change in the amount of urine Side effects that usually do not require medical attention (report to your doctor or health care professional if they continue or are  bothersome): -bone pain -muscle pain This list may not describe all possible side effects. Call your doctor for medical advice about side effects. You may report side effects to FDA at 1-800-FDA-1088. Where should I keep my medicine? Keep out of the reach of children. Store pre-filled syringes in a refrigerator between 2 and 8 degrees C (36 and 46 degrees F). Do not freeze. Keep in carton to protect from light. Throw away this medicine if it is left out of the refrigerator for more than 48 hours. Throw away any unused medicine after the expiration date. NOTE: This sheet is a summary. It may not cover all possible information. If you have questions about this medicine, talk to your doctor, pharmacist, or health care provider.    2016, [employer]vier/[employer] Standard. (2014-05-19 14:30:14)  

## 2015-11-28 ENCOUNTER — Ambulatory Visit: Payer: Medicare Other

## 2015-11-28 ENCOUNTER — Other Ambulatory Visit (HOSPITAL_BASED_OUTPATIENT_CLINIC_OR_DEPARTMENT_OTHER): Payer: Medicare Other

## 2015-11-28 ENCOUNTER — Ambulatory Visit (HOSPITAL_BASED_OUTPATIENT_CLINIC_OR_DEPARTMENT_OTHER): Payer: Medicare Other

## 2015-11-28 VITALS — BP 143/66 | HR 70 | Temp 97.6°F | Resp 16

## 2015-11-28 DIAGNOSIS — Z5111 Encounter for antineoplastic chemotherapy: Secondary | ICD-10-CM | POA: Diagnosis not present

## 2015-11-28 DIAGNOSIS — C541 Malignant neoplasm of endometrium: Secondary | ICD-10-CM | POA: Diagnosis not present

## 2015-11-28 DIAGNOSIS — C549 Malignant neoplasm of corpus uteri, unspecified: Secondary | ICD-10-CM

## 2015-11-28 LAB — COMPREHENSIVE METABOLIC PANEL
ALT: 18 U/L (ref 0–55)
AST: 24 U/L (ref 5–34)
Albumin: 3.7 g/dL (ref 3.5–5.0)
Alkaline Phosphatase: 65 U/L (ref 40–150)
Anion Gap: 12 mEq/L — ABNORMAL HIGH (ref 3–11)
BUN: 28.8 mg/dL — ABNORMAL HIGH (ref 7.0–26.0)
CO2: 21 mEq/L — ABNORMAL LOW (ref 22–29)
Calcium: 9.5 mg/dL (ref 8.4–10.4)
Chloride: 105 mEq/L (ref 98–109)
Creatinine: 0.9 mg/dL (ref 0.6–1.1)
EGFR: 66 mL/min/{1.73_m2} — ABNORMAL LOW (ref >90)
Glucose: 226 mg/dl — ABNORMAL HIGH (ref 70–140)
Potassium: 4.2 mEq/L (ref 3.5–5.1)
Sodium: 138 mEq/L (ref 136–145)
Total Bilirubin: 0.64 mg/dL (ref 0.20–1.20)
Total Protein: 6.8 g/dL (ref 6.4–8.3)

## 2015-11-28 LAB — CBC WITH DIFFERENTIAL/PLATELET
BASO%: 0 % (ref 0.0–2.0)
Basophils Absolute: 0 10*3/uL (ref 0.0–0.1)
EOS%: 0 % (ref 0.0–7.0)
Eosinophils Absolute: 0 10*3/uL (ref 0.0–0.5)
HCT: 33.4 % — ABNORMAL LOW (ref 34.8–46.6)
HGB: 11.3 g/dL — ABNORMAL LOW (ref 11.6–15.9)
LYMPH%: 9.3 % — ABNORMAL LOW (ref 14.0–49.7)
MCH: 33.4 pg (ref 25.1–34.0)
MCHC: 33.8 g/dL (ref 31.5–36.0)
MCV: 98.8 fL (ref 79.5–101.0)
MONO#: 0 10*3/uL — ABNORMAL LOW (ref 0.1–0.9)
MONO%: 0.8 % (ref 0.0–14.0)
NEUT#: 3.2 10*3/uL (ref 1.5–6.5)
NEUT%: 89.9 % — ABNORMAL HIGH (ref 38.4–76.8)
Platelets: 176 10*3/uL (ref 145–400)
RBC: 3.38 10*6/uL — ABNORMAL LOW (ref 3.70–5.45)
RDW: 13 % (ref 11.2–14.5)
WBC: 3.6 10*3/uL — ABNORMAL LOW (ref 3.9–10.3)
lymph#: 0.3 10*3/uL — ABNORMAL LOW (ref 0.9–3.3)

## 2015-11-28 MED ORDER — FAMOTIDINE IN NACL 20-0.9 MG/50ML-% IV SOLN
20.0000 mg | Freq: Once | INTRAVENOUS | Status: AC
  Administered 2015-11-28: 20 mg via INTRAVENOUS

## 2015-11-28 MED ORDER — SODIUM CHLORIDE 0.9 % IV SOLN: Freq: Once | INTRAVENOUS | Status: DC

## 2015-11-28 MED ORDER — DIPHENHYDRAMINE HCL 50 MG/ML IJ SOLN
INTRAMUSCULAR | Status: AC
  Filled 2015-11-28: qty 1

## 2015-11-28 MED ORDER — SODIUM CHLORIDE 0.9 % IV SOLN
Freq: Once | INTRAVENOUS | Status: AC
  Administered 2015-11-28: 10:00:00 via INTRAVENOUS
  Filled 2015-11-28: qty 4

## 2015-11-28 MED ORDER — SODIUM CHLORIDE 0.9 % IJ SOLN
10.0000 mL | Freq: Once | INTRAMUSCULAR | Status: AC
  Administered 2015-11-28: 10 mL
  Filled 2015-11-28: qty 10

## 2015-11-28 MED ORDER — SODIUM CHLORIDE 0.9 % IV SOLN
80.0000 mg/m2 | Freq: Once | INTRAVENOUS | Status: AC
  Administered 2015-11-28: 114 mg via INTRAVENOUS
  Filled 2015-11-28: qty 19

## 2015-11-28 MED ORDER — HEPARIN SOD (PORK) LOCK FLUSH 100 UNIT/ML IV SOLN
500.0000 [IU] | Freq: Once | INTRAVENOUS | Status: AC
  Administered 2015-11-28: 500 [IU] via INTRAVENOUS
  Filled 2015-11-28: qty 5

## 2015-11-28 MED ORDER — DIPHENHYDRAMINE HCL 50 MG/ML IJ SOLN
12.5000 mg | Freq: Once | INTRAMUSCULAR | Status: AC
  Administered 2015-11-28: 12.5 mg via INTRAVENOUS

## 2015-11-28 MED ORDER — FAMOTIDINE IN NACL 20-0.9 MG/50ML-% IV SOLN
INTRAVENOUS | Status: AC
  Filled 2015-11-28: qty 50

## 2015-11-28 MED ORDER — SODIUM CHLORIDE 0.9 % IV SOLN
Freq: Once | INTRAVENOUS | Status: AC
  Administered 2015-11-28: 10:00:00 via INTRAVENOUS

## 2015-11-28 MED ORDER — SODIUM CHLORIDE 0.9 % IJ SOLN
10.0000 mL | Freq: Once | INTRAMUSCULAR | Status: AC
  Administered 2015-11-28: 10 mL via INTRAVENOUS
  Filled 2015-11-28: qty 10

## 2015-11-28 NOTE — Patient Instructions (Signed)
[facility] Discharge Instructions for Patients Receiving Chemotherapy  Today you received the following chemotherapy agents Taxol   To help prevent nausea and vomiting after your treatment, we encourage you to take your nausea medication as directed.   If you develop nausea and vomiting that is not controlled by your nausea medication, call the clinic.   BELOW ARE SYMPTOMS THAT SHOULD BE REPORTED IMMEDIATELY:  *FEVER GREATER THAN 100.5 F  *CHILLS WITH OR WITHOUT FEVER  NAUSEA AND VOMITING THAT IS NOT CONTROLLED WITH YOUR NAUSEA MEDICATION  *UNUSUAL SHORTNESS OF BREATH  *UNUSUAL BRUISING OR BLEEDING  TENDERNESS IN MOUTH AND THROAT WITH OR WITHOUT PRESENCE OF ULCERS  *URINARY PROBLEMS  *BOWEL PROBLEMS  UNUSUAL RASH Items with * indicate a potential emergency and should be followed up as soon as possible.  Feel free to call the clinic you have any questions or concerns. The clinic phone number is [phone].  Please show the CHEMO ALERT CARD at check-in to the Emergency Department and triage nurse.   

## 2015-11-29 ENCOUNTER — Ambulatory Visit (HOSPITAL_BASED_OUTPATIENT_CLINIC_OR_DEPARTMENT_OTHER): Payer: Medicare Other

## 2015-11-29 VITALS — BP 133/67 | HR 61 | Temp 98.0°F | Resp 18

## 2015-11-29 DIAGNOSIS — D701 Agranulocytosis secondary to cancer chemotherapy: Secondary | ICD-10-CM | POA: Diagnosis present

## 2015-11-29 DIAGNOSIS — Z95828 Presence of other vascular implants and grafts: Secondary | ICD-10-CM

## 2015-11-29 DIAGNOSIS — C7951 Secondary malignant neoplasm of bone: Secondary | ICD-10-CM | POA: Diagnosis not present

## 2015-11-29 DIAGNOSIS — T451X5A Adverse effect of antineoplastic and immunosuppressive drugs, initial encounter: Secondary | ICD-10-CM

## 2015-11-29 MED ORDER — TBO-FILGRASTIM 480 MCG/0.8ML ~~LOC~~ SOSY
480.0000 ug | PREFILLED_SYRINGE | Freq: Once | SUBCUTANEOUS | Status: AC
  Administered 2015-11-29: 480 ug via SUBCUTANEOUS
  Filled 2015-11-29: qty 0.8

## 2015-11-29 NOTE — Patient Instructions (Signed)

## 2015-12-04 DIAGNOSIS — M79671 Pain in right foot: Secondary | ICD-10-CM | POA: Diagnosis not present

## 2015-12-04 DIAGNOSIS — M722 Plantar fascial fibromatosis: Secondary | ICD-10-CM | POA: Diagnosis not present

## 2015-12-05 ENCOUNTER — Other Ambulatory Visit: Payer: Self-pay | Admitting: Oncology

## 2015-12-05 ENCOUNTER — Encounter: Payer: Medicare Other | Admitting: Thoracic Surgery (Cardiothoracic Vascular Surgery)

## 2015-12-05 ENCOUNTER — Other Ambulatory Visit (HOSPITAL_BASED_OUTPATIENT_CLINIC_OR_DEPARTMENT_OTHER): Payer: Medicare Other

## 2015-12-05 ENCOUNTER — Ambulatory Visit: Payer: Medicare Other

## 2015-12-05 ENCOUNTER — Ambulatory Visit (HOSPITAL_BASED_OUTPATIENT_CLINIC_OR_DEPARTMENT_OTHER): Payer: Medicare Other

## 2015-12-05 DIAGNOSIS — D701 Agranulocytosis secondary to cancer chemotherapy: Secondary | ICD-10-CM | POA: Diagnosis not present

## 2015-12-05 DIAGNOSIS — Z95828 Presence of other vascular implants and grafts: Secondary | ICD-10-CM

## 2015-12-05 DIAGNOSIS — T451X5A Adverse effect of antineoplastic and immunosuppressive drugs, initial encounter: Secondary | ICD-10-CM

## 2015-12-05 DIAGNOSIS — C541 Malignant neoplasm of endometrium: Secondary | ICD-10-CM

## 2015-12-05 DIAGNOSIS — C7951 Secondary malignant neoplasm of bone: Secondary | ICD-10-CM | POA: Diagnosis not present

## 2015-12-05 LAB — CBC WITH DIFFERENTIAL/PLATELET
BASO%: 0.6 % (ref 0.0–2.0)
Basophils Absolute: 0 10*3/uL (ref 0.0–0.1)
EOS%: 0 % (ref 0.0–7.0)
Eosinophils Absolute: 0 10*3/uL (ref 0.0–0.5)
HCT: 30.4 % — ABNORMAL LOW (ref 34.8–46.6)
HGB: 10.3 g/dL — ABNORMAL LOW (ref 11.6–15.9)
LYMPH%: 22.9 % (ref 14.0–49.7)
MCH: 33.6 pg (ref 25.1–34.0)
MCHC: 33.9 g/dL (ref 31.5–36.0)
MCV: 99 fL (ref 79.5–101.0)
MONO#: 0 10*3/uL — ABNORMAL LOW (ref 0.1–0.9)
MONO%: 1.3 % (ref 0.0–14.0)
NEUT#: 1.2 10*3/uL — ABNORMAL LOW (ref 1.5–6.5)
NEUT%: 75.2 % (ref 38.4–76.8)
Platelets: 182 10*3/uL (ref 145–400)
RBC: 3.07 10*6/uL — ABNORMAL LOW (ref 3.70–5.45)
RDW: 12.8 % (ref 11.2–14.5)
WBC: 1.6 10*3/uL — ABNORMAL LOW (ref 3.9–10.3)
lymph#: 0.4 10*3/uL — ABNORMAL LOW (ref 0.9–3.3)
nRBC: 0 % (ref 0–0)

## 2015-12-05 LAB — COMPREHENSIVE METABOLIC PANEL
ALT: 16 U/L (ref 0–55)
AST: 18 U/L (ref 5–34)
Albumin: 3.5 g/dL (ref 3.5–5.0)
Alkaline Phosphatase: 49 U/L (ref 40–150)
Anion Gap: 11 mEq/L (ref 3–11)
BUN: 17.1 mg/dL (ref 7.0–26.0)
CO2: 21 mEq/L — ABNORMAL LOW (ref 22–29)
Calcium: 9.1 mg/dL (ref 8.4–10.4)
Chloride: 109 mEq/L (ref 98–109)
Creatinine: 0.8 mg/dL (ref 0.6–1.1)
EGFR: 75 mL/min/{1.73_m2} — ABNORMAL LOW (ref >90)
Glucose: 151 mg/dl — ABNORMAL HIGH (ref 70–140)
Potassium: 4.4 mEq/L (ref 3.5–5.1)
Sodium: 141 mEq/L (ref 136–145)
Total Bilirubin: 0.36 mg/dL (ref 0.20–1.20)
Total Protein: 6.5 g/dL (ref 6.4–8.3)

## 2015-12-05 MED ORDER — SODIUM CHLORIDE 0.9 % IJ SOLN
10.0000 mL | INTRAMUSCULAR | Status: DC | PRN
  Administered 2015-12-05: 10 mL via INTRAVENOUS
  Filled 2015-12-05: qty 10

## 2015-12-05 MED ORDER — HEPARIN SOD (PORK) LOCK FLUSH 100 UNIT/ML IV SOLN
500.0000 [IU] | Freq: Once | INTRAVENOUS | Status: AC | PRN
  Administered 2015-12-05: 500 [IU] via INTRAVENOUS
  Filled 2015-12-05: qty 5

## 2015-12-05 MED ORDER — TBO-FILGRASTIM 480 MCG/0.8ML ~~LOC~~ SOSY
480.0000 ug | PREFILLED_SYRINGE | Freq: Once | SUBCUTANEOUS | Status: AC
  Administered 2015-12-05: 480 ug via SUBCUTANEOUS
  Filled 2015-12-05: qty 0.8

## 2015-12-05 NOTE — Patient Instructions (Signed)
Neutropenia Neutropenia is a condition that occurs when the level of a certain type of white blood cell (neutrophil) in your body becomes lower than normal. Neutrophils are made in the bone marrow and fight infections. These cells protect against bacteria and viruses. The fewer neutrophils you have, and the longer your body remains without them, the greater your risk of getting a severe infection becomes. CAUSES  The cause of neutropenia may be hard to determine. However, it is usually due to 3 main problems:   Decreased production of neutrophils. This may be due to:  Certain medicines such as chemotherapy.  Genetic problems.  Cancer.  Radiation treatments.  Vitamin deficiency.  Some pesticides.  Increased destruction of neutrophils. This may be due to:  Overwhelming infections.  Hemolytic anemia. This is when the body destroys its own blood cells.  Chemotherapy.  Neutrophils moving to areas of the body where they cannot fight infections. This may be due to:  Dialysis procedures.  Conditions where the spleen becomes enlarged. Neutrophils are held in the spleen and are not available to the rest of the body.  Overwhelming infections. The neutrophils are held in the area of the infection and are not available to the rest of the body. SYMPTOMS  There are no specific symptoms of neutropenia. The lack of neutrophils can result in an infection, and an infection can cause various problems. DIAGNOSIS  Diagnosis is made by a blood test. A complete blood count is performed. The normal level of neutrophils in human blood differs with age and race. Infants have lower counts than older children and adults. African Americans have lower counts than Caucasians or Asians. The average adult level is 1500 cells/mm3 of blood. Neutrophil counts are interpreted as follows:  Greater than 1000 cells/mm3 gives normal protection against infection.  500 to 1000 cells/mm3 gives an increased risk for  infection.  200 to 500 cells/mm3 is a greater risk for severe infection.  Lower than 200 cells/mm3 is a marked risk of infection. This may require hospitalization and treatment with antibiotic medicines. TREATMENT  Treatment depends on the underlying cause, severity, and presence of infections or symptoms. It also depends on your health. Your caregiver will discuss the treatment plan with you. Mild cases are often easily treated and have a good outcome. Preventative measures may also be started to limit your risk of infections. Treatment can include:  Taking antibiotics.  Stopping medicines that are known to cause neutropenia.  Correcting nutritional deficiencies by eating green vegetables to supply folic acid and taking vitamin B supplements.  Stopping exposure to pesticides if your neutropenia is related to pesticide exposure.  Taking a blood growth factor called sargramostim, pegfilgrastim, or filgrastim if you are undergoing chemotherapy for cancer. This stimulates white blood cell production.  Removal of the spleen if you have Felty's syndrome and have repeated infections. HOME CARE INSTRUCTIONS   Follow your caregiver's instructions about when you need to have blood work done.  Wash your hands often. Make sure others who come in contact with you also wash their hands.  Wash raw fruits and vegetables before eating them. They can carry bacteria and fungi.  Avoid people with colds or spreadable (contagious) diseases (chickenpox, herpes zoster, influenza).  Avoid large crowds.  Avoid construction areas. The dust can release fungus into the air.  Be cautious around children in daycare or school environments.  Take care of your respiratory system by coughing and deep breathing.  Bathe daily.  Protect your skin from cuts and   burns.  Do not work in the garden or with flowers and plants.  Care for the mouth before and after meals by brushing with a soft toothbrush. If you have  mucositis, do not use mouthwash. Mouthwash contains alcohol and can dry out the mouth even more.  Clean the area between the genitals and the anus (perineal area) after urination and bowel movements. Women need to wipe from front to back.  Use a water soluble lubricant during sexual intercourse and practice good hygiene after. Do not have intercourse if you are severely neutropenic. Check with your caregiver for guidelines.  Exercise daily as tolerated.  Avoid people who were vaccinated with a live vaccine in the past 30 days. You should not receive live vaccines (polio, typhoid).  Do not provide direct care for pets. Avoid animal droppings. Do not clean litter boxes and bird cages.  Do not share food utensils.  Do not use tampons, enemas, or rectal suppositories unless directed by your caregiver.  Use an electric razor to remove hair.  Wash your hands after handling magazines, letters, and newspapers. SEEK IMMEDIATE MEDICAL CARE IF:   You have a fever.  You have chills or start to shake.  You feel nauseous or vomit.  You develop mouth sores.  You develop aches and pains.  You have redness and swelling around open wounds.  Your skin is warm to the touch.  You have pus coming from your wounds.  You develop swollen lymph nodes.  You feel weak or fatigued.  You develop red streaks on the skin. MAKE SURE YOU:  Understand these instructions.  Will watch your condition.  Will get help right away if you are not doing well or get worse.   This information is not intended to replace advice given to you by your health care provider. Make sure you discuss any questions you have with your health care provider.   Document Released: 10/19/2001 Document Revised: 07/22/2011 Document Reviewed: 11/09/2014 Elsevier Interactive Patient Education [employer].

## 2015-12-05 NOTE — Progress Notes (Signed)
Reviewed labs. ANC noted to be 1.2.  TC to Dr. Marko Plume. Taxol treatment to be held today and pt to receive Granix injection today instead of tomorrow. Explained this to pt and she voiced understanding.

## 2015-12-06 ENCOUNTER — Ambulatory Visit: Payer: Medicare Other

## 2015-12-06 ENCOUNTER — Telehealth: Payer: Self-pay

## 2015-12-07 DIAGNOSIS — M722 Plantar fascial fibromatosis: Secondary | ICD-10-CM | POA: Diagnosis not present

## 2015-12-07 DIAGNOSIS — M79671 Pain in right foot: Secondary | ICD-10-CM | POA: Diagnosis not present

## 2015-12-10 ENCOUNTER — Other Ambulatory Visit: Payer: Self-pay | Admitting: Oncology

## 2015-12-11 ENCOUNTER — Other Ambulatory Visit (HOSPITAL_BASED_OUTPATIENT_CLINIC_OR_DEPARTMENT_OTHER): Payer: Medicare Other

## 2015-12-11 ENCOUNTER — Encounter: Payer: Self-pay | Admitting: Oncology

## 2015-12-11 ENCOUNTER — Telehealth: Payer: Self-pay | Admitting: Oncology

## 2015-12-11 ENCOUNTER — Ambulatory Visit (HOSPITAL_BASED_OUTPATIENT_CLINIC_OR_DEPARTMENT_OTHER): Payer: Medicare Other | Admitting: Oncology

## 2015-12-11 ENCOUNTER — Ambulatory Visit (HOSPITAL_BASED_OUTPATIENT_CLINIC_OR_DEPARTMENT_OTHER): Payer: Medicare Other

## 2015-12-11 VITALS — BP 154/74 | HR 64 | Temp 100.0°F | Resp 18 | Ht 59.0 in | Wt 106.9 lb

## 2015-12-11 DIAGNOSIS — C7951 Secondary malignant neoplasm of bone: Secondary | ICD-10-CM | POA: Diagnosis not present

## 2015-12-11 DIAGNOSIS — T451X5A Adverse effect of antineoplastic and immunosuppressive drugs, initial encounter: Secondary | ICD-10-CM

## 2015-12-11 DIAGNOSIS — Z95828 Presence of other vascular implants and grafts: Secondary | ICD-10-CM

## 2015-12-11 DIAGNOSIS — C541 Malignant neoplasm of endometrium: Secondary | ICD-10-CM

## 2015-12-11 DIAGNOSIS — D6481 Anemia due to antineoplastic chemotherapy: Secondary | ICD-10-CM | POA: Diagnosis not present

## 2015-12-11 DIAGNOSIS — Z853 Personal history of malignant neoplasm of breast: Secondary | ICD-10-CM

## 2015-12-11 DIAGNOSIS — M722 Plantar fascial fibromatosis: Secondary | ICD-10-CM | POA: Diagnosis not present

## 2015-12-11 DIAGNOSIS — D701 Agranulocytosis secondary to cancer chemotherapy: Secondary | ICD-10-CM | POA: Diagnosis not present

## 2015-12-11 DIAGNOSIS — E739 Lactose intolerance, unspecified: Secondary | ICD-10-CM | POA: Diagnosis not present

## 2015-12-11 DIAGNOSIS — G620 Drug-induced polyneuropathy: Secondary | ICD-10-CM | POA: Diagnosis not present

## 2015-12-11 LAB — COMPREHENSIVE METABOLIC PANEL
ALT: 16 U/L (ref 0–55)
AST: 22 U/L (ref 5–34)
Albumin: 3.6 g/dL (ref 3.5–5.0)
Alkaline Phosphatase: 56 U/L (ref 40–150)
Anion Gap: 10 mEq/L (ref 3–11)
BUN: 21.3 mg/dL (ref 7.0–26.0)
CO2: 27 mEq/L (ref 22–29)
Calcium: 10 mg/dL (ref 8.4–10.4)
Chloride: 107 mEq/L (ref 98–109)
Creatinine: 0.8 mg/dL (ref 0.6–1.1)
EGFR: 74 mL/min/{1.73_m2} — ABNORMAL LOW (ref >90)
Glucose: 105 mg/dl (ref 70–140)
Potassium: 4.3 mEq/L (ref 3.5–5.1)
Sodium: 143 mEq/L (ref 136–145)
Total Bilirubin: 0.52 mg/dL (ref 0.20–1.20)
Total Protein: 6.7 g/dL (ref 6.4–8.3)

## 2015-12-11 LAB — CBC WITH DIFFERENTIAL/PLATELET
BASO%: 0.6 % (ref 0.0–2.0)
Basophils Absolute: 0 10*3/uL (ref 0.0–0.1)
EOS%: 0.4 % (ref 0.0–7.0)
Eosinophils Absolute: 0 10*3/uL (ref 0.0–0.5)
HCT: 33.8 % — ABNORMAL LOW (ref 34.8–46.6)
HGB: 11.3 g/dL — ABNORMAL LOW (ref 11.6–15.9)
LYMPH%: 20.4 % (ref 14.0–49.7)
MCH: 33.8 pg (ref 25.1–34.0)
MCHC: 33.5 g/dL (ref 31.5–36.0)
MCV: 100.7 fL (ref 79.5–101.0)
MONO#: 1.2 10*3/uL — ABNORMAL HIGH (ref 0.1–0.9)
MONO%: 23 % — ABNORMAL HIGH (ref 0.0–14.0)
NEUT#: 3 10*3/uL (ref 1.5–6.5)
NEUT%: 55.6 % (ref 38.4–76.8)
Platelets: 180 10*3/uL (ref 145–400)
RBC: 3.36 10*6/uL — ABNORMAL LOW (ref 3.70–5.45)
RDW: 13.3 % (ref 11.2–14.5)
WBC: 5.4 10*3/uL (ref 3.9–10.3)
lymph#: 1.1 10*3/uL (ref 0.9–3.3)

## 2015-12-11 MED ORDER — HEPARIN SOD (PORK) LOCK FLUSH 100 UNIT/ML IV SOLN
500.0000 [IU] | Freq: Once | INTRAVENOUS | Status: AC | PRN
  Administered 2015-12-11: 500 [IU] via INTRAVENOUS
  Filled 2015-12-11: qty 5

## 2015-12-11 MED ORDER — SODIUM CHLORIDE 0.9 % IJ SOLN
10.0000 mL | INTRAMUSCULAR | Status: DC | PRN
  Administered 2015-12-11: 10 mL via INTRAVENOUS
  Filled 2015-12-11: qty 10

## 2015-12-11 NOTE — Patient Instructions (Signed)

## 2015-12-11 NOTE — Telephone Encounter (Signed)
appt added to pt schedule per [facility] pof

## 2015-12-11 NOTE — Progress Notes (Signed)
OFFICE PROGRESS NOTE   December 11, 2015   Physicians: P.Gehrig, J.Kinard, (T.Brackbill) J.Russo, S.Ganem, [facility]   INTERVAL HISTORY:   Patient is seen, alone for visit, continuing treatment for metastatic endometrial carcinoma involving at least L3-4. Plan is to continue present dose dense carbo taxol thru cycle 6, then repeat scans. Day 15 cycle 4 was held on 12-05-15 due to [place] 1.2. We will omit that treatment and proceed with day 1 cycle 5 on 12-12-15. Will increase granix to days 2,3,9,10 (=addition of day 10 with cycle 5). Will hold day 15 cycle 5 chemo and day 16 granix to allow patient to be at beach with family weeks of Aug 14 and 21. She will resume with day 1 cycle 6 on Aug 29. Plan repeat scans after cycle 6, and expect that she will see Dr Alycia Rossetti about that point.   She had injection in foot for plantar fasciitis on 12-07-15 by [place], and is to get orthotics. The symptoms are much better.   Patient felt well last week when [place] too low to treat, and has felt generally well for past week. She has no increase in back pain over chronic scoliosis symptoms. Appetite and energy are reasonable, bowels moving regularly, no orthostatic symptoms, minimal peripheral neuropathy since treatment begun for plantar fasciitis, no fever or symptoms of infection. No problems with PAC. No bleeding, No swelling LE.  Remainder of 10 point Review of Systems negative/ unchanged.    PAC placed by IR on 09-13-15 Biopsy of recurrence 06-2014 ER negative. No genetics testing   Beach trip with family 8-14 thru 01-07-16.  ONCOLOGIC HISTORY  BREAST CANCER: microinvasive T1N0 right breast cancer which was ER/PR positive and HER-2 2+ with insufficient material for FISH at diagnosis in 03/1999, treated with lumpectomy with 2 sentinel node evaluation, local radiation and 5 years of Tamoxifen thru 05/2004, then aromatase inhibitor 07/2004 thru 04/2006.   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 She had 6 cycles of taxol/carboplatin through Jan 2010, and vaginal cuff brachytherapy. She did well until some vague LUQ symptoms in Nov. 2012, with CT AP in [facility] system Mar 22, 2011 showing left pelvic sidewall involvement. She had laparoscopic evaluation at [facility] by [place] 04-25-2011, with finding of dense fibrosis in the area as well as mass encasing obturator nerve and vein as well as internal iliac vasculature; left ureterolysis was performed. No pathology was submitted from that procedure. She received 3 additional cycles of taxol/ carboplatin from 05-16-2011 thru 06-28-2011. CT AP on 07-17-2011 showed improvement in the left pelvic sidewall mass now 1.9 x 2 cm and 3.4 cm cephalocaudad, as compared with 2.5 x 2.2 cm and 5.4 cm on CT 03-22-11, and no progressive disease elsewhere. She saw Dr.Gehrig after the CT, recommendation for an additonal 3 cycles of same chemotherapy . Cycle 4 was taxol + carbo and neulasta, cycle 5 taxol only due to counts and cycle 6 taxol + carboplatin on 09-17-11. Follow up CT in May 2013 showed enlargement of the left pelvic sidewall involvement. She had IMRT 50 GY in 28 fractions by Dr Sondra Come, that completed 12-05-11. She had regular follow up scans, Including CT AP 01-2014 and PET 02-2013, which did not show progressive disease. She has scoliosis and some chronic intermittent low back discomfort, but developed different, persistent discomfort low back and radiating to LLE by ~ Dec into Jan 2016.  MRI lumbar spine at [facility] on 06-13-2014 showed known scoliosis and degenerative disc disease, with new finding of 2.7 x 2.4 x 1.9 cm soft tissue mass at L3-4 involving left neural foramen, with focal cortical destruction of adjacent L3 vertebral body and portion of left L4 pedicle. She had CT biopsy on 06-21-14, with pathology  ([id number]) poorly differentiated carcinoma consistent with high grade serous. Bone scan 06-22-14 had uptake posteriorly in upper lumbar spine and a subtle increased area of uptake right greater trochanter of unclear significance. IMRT by Dr Roselind Messier 2-29 thru 08-12-14. Zometa x4 07-07-14 thru 10-2014. Progression at L1-2 and possible RLL lung nodule found on PET 04-2015 and CT CAP 06-2015. She began dose dense carbo taxol on 08-03-15. She was neutropenic by day 15 cycle 1, ANC 0.5 and granix added. PET 11-06-15,after 3 cycles doses dense carbo taxol , had slight improvement in hypermetabolic uptake at L3-4, no change size/ no hypermetabolism 1.3 cm RLL pulmonary nodule, resolution of L2-3 area and no correlate to slight hypermetabolism at T11-12.  Plan to continue dose dense carbo taxol an additional 3 cycles.  PDL-1 testing from 06-2014 pathology done 10-2015 had low expression of PDL-1, at only 2%.  Objective:  Vital signs in last 24 hours:  BP (!) 154/74 (BP Location: Right Arm, Patient Position: Sitting)   Pulse 64   Temp 100 F (37.8 C) (Oral)   Resp 18   Ht 4\' 11"  (1.499 m)   Wt 106 lb 14.4 oz (48.5 kg)   BMI 21.59 kg/m  Weight down 3 lbs. Alert, oriented and appropriate. Ambulatory without difficulty, easily able to get on and off exam table.   HEENT:PERRL, sclerae not icteric. Oral mucosa moist without lesions, posterior pharynx clear.  Neck supple. No JVD.  Lymphatics:no cervical,supraclavicular adenopathy Resp: clear to auscultation bilaterally and normal percussion bilaterally Cardio: regular rate and rhythm. No gallop. GI: soft, nontender, not distended, no mass or organomegaly. Normally active bowel sounds. Surgical incision not remarkable. Musculoskeletal/ Extremities: without pitting edema, cords, tenderness. Back not tender Neuro: no peripheral neuropathy. Otherwise nonfocal Skin without rash, ecchymosis, petechiae Portacath-without erythema or tenderness  Lab Results:  Results  for orders placed or performed in visit on 12/11/15  CBC with Differential  Result Value Ref Range   WBC 5.4 3.9 - 10.3 10e3/uL   NEUT# 3.0 1.5 - 6.5 10e3/uL   HGB 11.3 (L) 11.6 - 15.9 g/dL   HCT 12/13/15 (L) 63.7 - 29.4 %   Platelets 180 145 - 400 10e3/uL   MCV 100.7 79.5 - 101.0 fL   MCH 33.8 25.1 - 34.0 pg   MCHC 33.5 31.5 - 36.0 g/dL   RBC 26.2 (L) 7.00 - 4.84 10e6/uL   RDW 13.3 11.2 - 14.5 %   lymph# 1.1 0.9 - 3.3 10e3/uL   MONO# 1.2 (H) 0.1 - 0.9 10e3/uL   Eosinophils Absolute 0.0 0.0 - 0.5 10e3/uL   Basophils Absolute 0.0 0.0 - 0.1 10e3/uL   NEUT% 55.6 38.4 - 76.8 %   LYMPH% 20.4 14.0 - 49.7 %   MONO% 23.0 (H) 0.0 - 14.0 %   EOS% 0.4 0.0 - 7.0 %   BASO% 0.6 0.0 - 2.0 %  Comprehensive metabolic panel  Result Value Ref Range   Sodium 143 136 - 145 mEq/L   Potassium 4.3 3.5 - 5.1 mEq/L   Chloride 107 98 - 109 mEq/L   CO2 27 22 - 29 mEq/L   Glucose 105 70 - 140 mg/dl   BUN  21.3 7.0 - 26.0 mg/dL   Creatinine 0.8 0.6 - 1.1 mg/dL   Total Bilirubin 0.52 0.20 - 1.20 mg/dL   Alkaline Phosphatase 56 40 - 150 U/L   AST 22 5 - 34 U/L   ALT 16 0 - 55 U/L   Total Protein 6.7 6.4 - 8.3 g/dL   Albumin 3.6 3.5 - 5.0 g/dL   Calcium 10.0 8.4 - 10.4 mg/dL   Anion Gap 10 3 - 11 mEq/L   EGFR 74 (L) >90 ml/min/1.73 m2     Studies/Results:  PDL-1 testing by [facility] [id number] on path material from 06-21-14 has low expression with PDL-1 tumor proportion score 2% (low 1-49%), this testing sent by Dr Alycia Rossetti for possibility of using pembrolizumab, which doe not appear to be an option based on this testing. I did not discuss with patient at this visit, and do not know if she is aware of this information as yet.   Medications: I have reviewed the patient's current medications.  DISCUSSION Other than mostly asymptomatic cytopenias, she continues to tolerate present regimen well and is in agreement with continuing as planned.  Scheduling of chemo as at top of this note, allowing beach  vacation x 2 weeks and increased gCSF support.    Assessment/Plan:  1.Papillary serous endometrial carcinoma IB at diagnosis Aug 2009, recurrent at left pelvic sidewall 03-2011 and progression in L1-2 in spring 2016, now further progression in same lumbar area (+ slighly enlarged RLL pulmonary nodule which dates back several years) . PET slightly better after 3 cycles of dose dense carbo taxol which she is tolerating, plan to continue another 3 cycles then rescan. Prefers AMs, prefers Tuesdays. Carbo skin tests. Granix support will be increased now to include day 10. Try weekly until beach vacation in Aug. PDL-1 testing in process per Dr Alycia Rossetti, question of pembrolizumab later.   2.T1N0 right breast cancer 03-1999: adjuvant therapy included 5 years of tamoxifen. Not recurrent. Last mammograms at [facility] 10-18-14, tomo due to dense breast tissue. Mammograms scheduled 11-17-15 [facility] 3.scoliosis of lumbar spine with degenerative disc disease and chronic symptoms related to this, stable 4.HTN followed by Dr Virgina Jock. She had difficulty with prior chemo with lower BP and tachycardia also, and hypotension. Holding atenolol and amlodipine prn, pushing fluids.  5. Chemo neutropenia/ chemo leukopenia: related to total amount of chemo and radiation. Increase granix as above. 6.mild anemia multifactorial including chemo anemia. Hgb up over a gram since chemo break, iron not markedly low on 10-19-15. Follow 7.Marland KitchenPAC placed by IR on 09-13-15 8. Chemo peripheral neuropathy feet> hands, slight increase with recent taxol tho does not seem very bothersome now and patient in agreement with continuing that drug. 9.possible lactose intolerance: abdominal bloating and gaseousness improved off of milk products. 10.plantar fasciitis: improved with injection by [facility]. I have encouraged her to get orthotic also as recommended.  All questions answered and patient is in agreement with recommendations and  plans. Chemo and granix orders placed. Time spent 25 min including >50% counseling and coordination of care. Route Dr Virgina Jock  Evlyn Clines, MD   12/11/2015, 8:43 PM

## 2015-12-12 ENCOUNTER — Ambulatory Visit (HOSPITAL_BASED_OUTPATIENT_CLINIC_OR_DEPARTMENT_OTHER): Payer: Medicare Other

## 2015-12-12 VITALS — BP 140/72 | HR 61 | Temp 98.4°F | Resp 16

## 2015-12-12 DIAGNOSIS — C541 Malignant neoplasm of endometrium: Secondary | ICD-10-CM

## 2015-12-12 DIAGNOSIS — C549 Malignant neoplasm of corpus uteri, unspecified: Secondary | ICD-10-CM

## 2015-12-12 DIAGNOSIS — Z5111 Encounter for antineoplastic chemotherapy: Secondary | ICD-10-CM

## 2015-12-12 MED ORDER — SODIUM CHLORIDE 0.9 % IV SOLN
Freq: Once | INTRAVENOUS | Status: AC
  Administered 2015-12-12: 11:00:00 via INTRAVENOUS
  Filled 2015-12-12: qty 8

## 2015-12-12 MED ORDER — SODIUM CHLORIDE 0.9 % IV SOLN
Freq: Once | INTRAVENOUS | Status: AC
  Administered 2015-12-12: 10:00:00 via INTRAVENOUS

## 2015-12-12 MED ORDER — SODIUM CHLORIDE 0.9% FLUSH
10.0000 mL | INTRAVENOUS | Status: DC | PRN
  Administered 2015-12-12: 10 mL
  Filled 2015-12-12: qty 10

## 2015-12-12 MED ORDER — FAMOTIDINE IN NACL 20-0.9 MG/50ML-% IV SOLN
20.0000 mg | Freq: Once | INTRAVENOUS | Status: AC
  Administered 2015-12-12: 20 mg via INTRAVENOUS

## 2015-12-12 MED ORDER — FAMOTIDINE IN NACL 20-0.9 MG/50ML-% IV SOLN
INTRAVENOUS | Status: AC
  Filled 2015-12-12: qty 50

## 2015-12-12 MED ORDER — DIPHENHYDRAMINE HCL 50 MG/ML IJ SOLN
12.5000 mg | Freq: Once | INTRAMUSCULAR | Status: AC
  Administered 2015-12-12: 12.5 mg via INTRAVENOUS

## 2015-12-12 MED ORDER — DIPHENHYDRAMINE HCL 50 MG/ML IJ SOLN
INTRAMUSCULAR | Status: AC
  Filled 2015-12-12: qty 1

## 2015-12-12 MED ORDER — CARBOPLATIN CHEMO INTRADERMAL TEST DOSE 100MCG/0.02ML
100.0000 ug | Freq: Once | INTRADERMAL | Status: AC
  Administered 2015-12-12: 100 ug via INTRADERMAL
  Filled 2015-12-12: qty 0.01

## 2015-12-12 MED ORDER — HEPARIN SOD (PORK) LOCK FLUSH 100 UNIT/ML IV SOLN
500.0000 [IU] | Freq: Once | INTRAVENOUS | Status: AC | PRN
Start: 2015-12-12 — End: 2015-12-12
  Administered 2015-12-12: 500 [IU]
  Filled 2015-12-12: qty 5

## 2015-12-12 MED ORDER — SODIUM CHLORIDE 0.9 % IV SOLN
254.4000 mg | Freq: Once | INTRAVENOUS | Status: AC
  Administered 2015-12-12: 250 mg via INTRAVENOUS
  Filled 2015-12-12: qty 25

## 2015-12-12 MED ORDER — SODIUM CHLORIDE 0.9 % IV SOLN
80.0000 mg/m2 | Freq: Once | INTRAVENOUS | Status: AC
  Administered 2015-12-12: 114 mg via INTRAVENOUS
  Filled 2015-12-12: qty 19

## 2015-12-12 NOTE — Patient Instructions (Signed)
[facility] Discharge Instructions for Patients Receiving Chemotherapy  Today you received the following chemotherapy agents :  Taxol,  Carboplatin.  To help prevent nausea and vomiting after your treatment, we encourage you to take your nausea medication as prescribed.   If you develop nausea and vomiting that is not controlled by your nausea medication, call the clinic.   BELOW ARE SYMPTOMS THAT SHOULD BE REPORTED IMMEDIATELY:  *FEVER GREATER THAN 100.5 F  *CHILLS WITH OR WITHOUT FEVER  NAUSEA AND VOMITING THAT IS NOT CONTROLLED WITH YOUR NAUSEA MEDICATION  *UNUSUAL SHORTNESS OF BREATH  *UNUSUAL BRUISING OR BLEEDING  TENDERNESS IN MOUTH AND THROAT WITH OR WITHOUT PRESENCE OF ULCERS  *URINARY PROBLEMS  *BOWEL PROBLEMS  UNUSUAL RASH Items with * indicate a potential emergency and should be followed up as soon as possible.  Feel free to call the clinic you have any questions or concerns. The clinic phone number is [phone].  Please show the CHEMO ALERT CARD at check-in to the Emergency Department and triage nurse.   

## 2015-12-13 ENCOUNTER — Ambulatory Visit (HOSPITAL_BASED_OUTPATIENT_CLINIC_OR_DEPARTMENT_OTHER): Payer: Medicare Other

## 2015-12-13 VITALS — BP 137/79 | HR 66 | Temp 98.4°F | Resp 18

## 2015-12-13 DIAGNOSIS — Z95828 Presence of other vascular implants and grafts: Secondary | ICD-10-CM

## 2015-12-13 DIAGNOSIS — C7951 Secondary malignant neoplasm of bone: Secondary | ICD-10-CM | POA: Diagnosis not present

## 2015-12-13 DIAGNOSIS — D701 Agranulocytosis secondary to cancer chemotherapy: Secondary | ICD-10-CM | POA: Diagnosis present

## 2015-12-13 DIAGNOSIS — M722 Plantar fascial fibromatosis: Secondary | ICD-10-CM | POA: Insufficient documentation

## 2015-12-13 DIAGNOSIS — T451X5A Adverse effect of antineoplastic and immunosuppressive drugs, initial encounter: Secondary | ICD-10-CM

## 2015-12-13 MED ORDER — TBO-FILGRASTIM 480 MCG/0.8ML ~~LOC~~ SOSY
480.0000 ug | PREFILLED_SYRINGE | Freq: Once | SUBCUTANEOUS | Status: AC
  Administered 2015-12-13: 480 ug via SUBCUTANEOUS
  Filled 2015-12-13: qty 0.8

## 2015-12-13 NOTE — Patient Instructions (Signed)

## 2015-12-14 ENCOUNTER — Ambulatory Visit (HOSPITAL_BASED_OUTPATIENT_CLINIC_OR_DEPARTMENT_OTHER): Payer: Medicare Other

## 2015-12-14 ENCOUNTER — Telehealth: Payer: Self-pay | Admitting: *Deleted

## 2015-12-14 VITALS — BP 149/63 | HR 73 | Temp 98.3°F | Resp 19

## 2015-12-14 DIAGNOSIS — Z95828 Presence of other vascular implants and grafts: Secondary | ICD-10-CM

## 2015-12-14 DIAGNOSIS — C7951 Secondary malignant neoplasm of bone: Secondary | ICD-10-CM | POA: Diagnosis not present

## 2015-12-14 DIAGNOSIS — T451X5A Adverse effect of antineoplastic and immunosuppressive drugs, initial encounter: Secondary | ICD-10-CM

## 2015-12-14 DIAGNOSIS — D701 Agranulocytosis secondary to cancer chemotherapy: Secondary | ICD-10-CM

## 2015-12-14 MED ORDER — TBO-FILGRASTIM 480 MCG/0.8ML ~~LOC~~ SOSY
480.0000 ug | PREFILLED_SYRINGE | Freq: Once | SUBCUTANEOUS | Status: AC
  Administered 2015-12-14: 480 ug via SUBCUTANEOUS
  Filled 2015-12-14: qty 0.8

## 2015-12-14 NOTE — Telephone Encounter (Signed)
Returned pt's call concerning bone pain. Pt said she had received Granix yesterday and experienced pain to her shoulder area and back area. Been taking Motrin and Claritin. Today pt says, " my pain is more muscular, is this normal". I reassured the pt after receiving the Granix injection, this is normal and to continue taking the Claritin and Motrin. Pt is here to receive a second Granix today. Pt wanted to make sure the pain wasn't some kind of reaction to the injection being she had never experienced the bone pain before.

## 2015-12-14 NOTE — Patient Instructions (Signed)

## 2015-12-18 ENCOUNTER — Other Ambulatory Visit: Payer: Self-pay | Admitting: Oncology

## 2015-12-18 ENCOUNTER — Telehealth: Payer: Self-pay

## 2015-12-18 NOTE — Telephone Encounter (Signed)
Reviewed with Dr. Marko Plume.  She will add IVF to chemotherapy tomorrow.

## 2015-12-18 NOTE — Telephone Encounter (Signed)
[name] wanted Dr Marko Plume to know that she has had a different post chemotherapy experience.  [name] realizes that she received 2 chemotherapy drugs on 12-12-15 and that effects can be cumulative. She is eating ok. She feels weak and has had some shakiness.  She did experience the aches from the granix which she had never experienced with previous chemotherapy treatments. She is for lab and treatment tomorrow 12-19-15.

## 2015-12-19 ENCOUNTER — Ambulatory Visit: Payer: Medicare Other

## 2015-12-19 ENCOUNTER — Ambulatory Visit (HOSPITAL_BASED_OUTPATIENT_CLINIC_OR_DEPARTMENT_OTHER): Payer: Medicare Other

## 2015-12-19 ENCOUNTER — Other Ambulatory Visit (HOSPITAL_BASED_OUTPATIENT_CLINIC_OR_DEPARTMENT_OTHER): Payer: Medicare Other

## 2015-12-19 VITALS — BP 160/77 | HR 63 | Temp 98.5°F | Resp 18

## 2015-12-19 DIAGNOSIS — C541 Malignant neoplasm of endometrium: Secondary | ICD-10-CM | POA: Diagnosis not present

## 2015-12-19 DIAGNOSIS — C7951 Secondary malignant neoplasm of bone: Secondary | ICD-10-CM | POA: Diagnosis not present

## 2015-12-19 DIAGNOSIS — C549 Malignant neoplasm of corpus uteri, unspecified: Secondary | ICD-10-CM

## 2015-12-19 DIAGNOSIS — Z95828 Presence of other vascular implants and grafts: Secondary | ICD-10-CM

## 2015-12-19 DIAGNOSIS — Z5111 Encounter for antineoplastic chemotherapy: Secondary | ICD-10-CM | POA: Diagnosis not present

## 2015-12-19 LAB — COMPREHENSIVE METABOLIC PANEL
ALT: 17 U/L (ref 0–55)
AST: 18 U/L (ref 5–34)
Albumin: 3.5 g/dL (ref 3.5–5.0)
Alkaline Phosphatase: 59 U/L (ref 40–150)
Anion Gap: 8 mEq/L (ref 3–11)
BUN: 22.9 mg/dL (ref 7.0–26.0)
CO2: 23 mEq/L (ref 22–29)
Calcium: 9.3 mg/dL (ref 8.4–10.4)
Chloride: 107 mEq/L (ref 98–109)
Creatinine: 0.7 mg/dL (ref 0.6–1.1)
EGFR: 79 mL/min/{1.73_m2} — ABNORMAL LOW (ref >90)
Glucose: 140 mg/dl (ref 70–140)
Potassium: 4 mEq/L (ref 3.5–5.1)
Sodium: 138 mEq/L (ref 136–145)
Total Bilirubin: 0.53 mg/dL (ref 0.20–1.20)
Total Protein: 6.4 g/dL (ref 6.4–8.3)

## 2015-12-19 LAB — CBC WITH DIFFERENTIAL/PLATELET
BASO%: 0.1 % (ref 0.0–2.0)
Basophils Absolute: 0 10*3/uL (ref 0.0–0.1)
EOS%: 0 % (ref 0.0–7.0)
Eosinophils Absolute: 0 10*3/uL (ref 0.0–0.5)
HCT: 31.1 % — ABNORMAL LOW (ref 34.8–46.6)
HGB: 10.3 g/dL — ABNORMAL LOW (ref 11.6–15.9)
LYMPH%: 6.6 % — ABNORMAL LOW (ref 14.0–49.7)
MCH: 33.1 pg (ref 25.1–34.0)
MCHC: 33 g/dL (ref 31.5–36.0)
MCV: 100.3 fL (ref 79.5–101.0)
MONO#: 0.2 10*3/uL (ref 0.1–0.9)
MONO%: 3.7 % (ref 0.0–14.0)
NEUT#: 4.7 10*3/uL (ref 1.5–6.5)
NEUT%: 89.6 % — ABNORMAL HIGH (ref 38.4–76.8)
Platelets: 137 10*3/uL — ABNORMAL LOW (ref 145–400)
RBC: 3.1 10*6/uL — ABNORMAL LOW (ref 3.70–5.45)
RDW: 13.5 % (ref 11.2–14.5)
WBC: 5.2 10*3/uL (ref 3.9–10.3)
lymph#: 0.3 10*3/uL — ABNORMAL LOW (ref 0.9–3.3)

## 2015-12-19 MED ORDER — DIPHENHYDRAMINE HCL 50 MG/ML IJ SOLN
12.5000 mg | Freq: Once | INTRAMUSCULAR | Status: AC
  Administered 2015-12-19: 12.5 mg via INTRAVENOUS

## 2015-12-19 MED ORDER — FAMOTIDINE IN NACL 20-0.9 MG/50ML-% IV SOLN
20.0000 mg | Freq: Once | INTRAVENOUS | Status: AC
Start: 2015-12-19 — End: 2015-12-19
  Administered 2015-12-19: 20 mg via INTRAVENOUS

## 2015-12-19 MED ORDER — SODIUM CHLORIDE 0.9 % IJ SOLN
10.0000 mL | INTRAMUSCULAR | Status: DC | PRN
  Administered 2015-12-19: 10 mL via INTRAVENOUS
  Filled 2015-12-19: qty 10

## 2015-12-19 MED ORDER — SODIUM CHLORIDE 0.9 % IV SOLN
80.0000 mg/m2 | Freq: Once | INTRAVENOUS | Status: AC
  Administered 2015-12-19: 114 mg via INTRAVENOUS
  Filled 2015-12-19: qty 19

## 2015-12-19 MED ORDER — HEPARIN SOD (PORK) LOCK FLUSH 100 UNIT/ML IV SOLN
500.0000 [IU] | Freq: Once | INTRAVENOUS | Status: AC | PRN
  Administered 2015-12-19: 500 [IU] via INTRAVENOUS
  Filled 2015-12-19: qty 5

## 2015-12-19 MED ORDER — SODIUM CHLORIDE 0.9 % IV SOLN
Freq: Once | INTRAVENOUS | Status: AC
  Administered 2015-12-19: 13:00:00 via INTRAVENOUS

## 2015-12-19 MED ORDER — FAMOTIDINE IN NACL 20-0.9 MG/50ML-% IV SOLN
INTRAVENOUS | Status: AC
  Filled 2015-12-19: qty 50

## 2015-12-19 MED ORDER — DEXAMETHASONE SODIUM PHOSPHATE 100 MG/10ML IJ SOLN
Freq: Once | INTRAMUSCULAR | Status: AC
  Administered 2015-12-19: 14:00:00 via INTRAVENOUS
  Filled 2015-12-19: qty 4

## 2015-12-19 MED ORDER — DIPHENHYDRAMINE HCL 50 MG/ML IJ SOLN
INTRAMUSCULAR | Status: AC
  Filled 2015-12-19: qty 1

## 2015-12-19 NOTE — Telephone Encounter (Signed)
Spoke with [name] and told her that Dr. Marko Plume will add additional  IVF to her treatment today.   [name] states that she is eating well and drinking fluids as well.

## 2015-12-19 NOTE — Patient Instructions (Signed)
[facility] Discharge Instructions for Patients Receiving Chemotherapy  Today you received the following chemotherapy agents Taxol   To help prevent nausea and vomiting after your treatment, we encourage you to take your nausea medication as directed.   If you develop nausea and vomiting that is not controlled by your nausea medication, call the clinic.   BELOW ARE SYMPTOMS THAT SHOULD BE REPORTED IMMEDIATELY:  *FEVER GREATER THAN 100.5 F  *CHILLS WITH OR WITHOUT FEVER  NAUSEA AND VOMITING THAT IS NOT CONTROLLED WITH YOUR NAUSEA MEDICATION  *UNUSUAL SHORTNESS OF BREATH  *UNUSUAL BRUISING OR BLEEDING  TENDERNESS IN MOUTH AND THROAT WITH OR WITHOUT PRESENCE OF ULCERS  *URINARY PROBLEMS  *BOWEL PROBLEMS  UNUSUAL RASH Items with * indicate a potential emergency and should be followed up as soon as possible.  Feel free to call the clinic you have any questions or concerns. The clinic phone number is [phone].  Please show the CHEMO ALERT CARD at check-in to the Emergency Department and triage nurse.   

## 2015-12-19 NOTE — Patient Instructions (Signed)

## 2015-12-20 ENCOUNTER — Ambulatory Visit (HOSPITAL_BASED_OUTPATIENT_CLINIC_OR_DEPARTMENT_OTHER): Payer: Medicare Other

## 2015-12-20 VITALS — BP 141/66 | HR 59 | Temp 98.3°F | Resp 20

## 2015-12-20 DIAGNOSIS — C7951 Secondary malignant neoplasm of bone: Secondary | ICD-10-CM | POA: Diagnosis not present

## 2015-12-20 DIAGNOSIS — T451X5A Adverse effect of antineoplastic and immunosuppressive drugs, initial encounter: Secondary | ICD-10-CM

## 2015-12-20 DIAGNOSIS — D701 Agranulocytosis secondary to cancer chemotherapy: Secondary | ICD-10-CM

## 2015-12-20 DIAGNOSIS — Z95828 Presence of other vascular implants and grafts: Secondary | ICD-10-CM

## 2015-12-20 MED ORDER — TBO-FILGRASTIM 480 MCG/0.8ML ~~LOC~~ SOSY
480.0000 ug | PREFILLED_SYRINGE | Freq: Once | SUBCUTANEOUS | Status: AC
  Administered 2015-12-20: 480 ug via SUBCUTANEOUS
  Filled 2015-12-20: qty 0.8

## 2015-12-20 NOTE — Patient Instructions (Signed)

## 2015-12-21 ENCOUNTER — Ambulatory Visit (HOSPITAL_BASED_OUTPATIENT_CLINIC_OR_DEPARTMENT_OTHER): Payer: Medicare Other

## 2015-12-21 VITALS — BP 137/78 | HR 62 | Temp 99.0°F | Resp 20

## 2015-12-21 DIAGNOSIS — D701 Agranulocytosis secondary to cancer chemotherapy: Secondary | ICD-10-CM

## 2015-12-21 DIAGNOSIS — C7951 Secondary malignant neoplasm of bone: Secondary | ICD-10-CM

## 2015-12-21 DIAGNOSIS — T451X5A Adverse effect of antineoplastic and immunosuppressive drugs, initial encounter: Secondary | ICD-10-CM

## 2015-12-21 DIAGNOSIS — Z95828 Presence of other vascular implants and grafts: Secondary | ICD-10-CM

## 2015-12-21 MED ORDER — TBO-FILGRASTIM 480 MCG/0.8ML ~~LOC~~ SOSY
480.0000 ug | PREFILLED_SYRINGE | Freq: Once | SUBCUTANEOUS | Status: AC
  Administered 2015-12-21: 480 ug via SUBCUTANEOUS
  Filled 2015-12-21: qty 0.8

## 2015-12-21 NOTE — Patient Instructions (Addendum)

## 2016-01-06 ENCOUNTER — Other Ambulatory Visit: Payer: Self-pay | Admitting: Oncology

## 2016-01-06 ENCOUNTER — Encounter: Payer: Self-pay | Admitting: Oncology

## 2016-01-06 NOTE — Progress Notes (Signed)
Medical Oncology  Scheduling message:  cancel lab and flush 8-29.   Will use lab from 8-28 and access PAC in infusion Rx 8-29 is correct injections 8-30 and 8-31 are correct lab flush Rx 9-5 is correct cancel injections 9-5 and 9-7.   Injection should be 9-6' (MD + lab 9-11 correct) cancel lab and injection 9-12. Will use lab from 9-11 Rx 9-12 is correct Injection should be 9-13  L.Marko Plume, MD

## 2016-01-08 ENCOUNTER — Telehealth: Payer: Self-pay | Admitting: Oncology

## 2016-01-08 ENCOUNTER — Encounter: Payer: Self-pay | Admitting: Oncology

## 2016-01-08 ENCOUNTER — Other Ambulatory Visit (HOSPITAL_BASED_OUTPATIENT_CLINIC_OR_DEPARTMENT_OTHER): Payer: Medicare Other

## 2016-01-08 ENCOUNTER — Ambulatory Visit (HOSPITAL_BASED_OUTPATIENT_CLINIC_OR_DEPARTMENT_OTHER): Payer: Medicare Other | Admitting: Oncology

## 2016-01-08 VITALS — BP 154/73 | HR 68 | Temp 98.7°F | Resp 18 | Ht 59.0 in | Wt 106.9 lb

## 2016-01-08 DIAGNOSIS — D701 Agranulocytosis secondary to cancer chemotherapy: Secondary | ICD-10-CM | POA: Diagnosis not present

## 2016-01-08 DIAGNOSIS — Z95828 Presence of other vascular implants and grafts: Secondary | ICD-10-CM

## 2016-01-08 DIAGNOSIS — T451X5A Adverse effect of antineoplastic and immunosuppressive drugs, initial encounter: Secondary | ICD-10-CM

## 2016-01-08 DIAGNOSIS — D6481 Anemia due to antineoplastic chemotherapy: Secondary | ICD-10-CM | POA: Diagnosis not present

## 2016-01-08 DIAGNOSIS — C541 Malignant neoplasm of endometrium: Secondary | ICD-10-CM | POA: Diagnosis not present

## 2016-01-08 DIAGNOSIS — G620 Drug-induced polyneuropathy: Secondary | ICD-10-CM

## 2016-01-08 LAB — COMPREHENSIVE METABOLIC PANEL
ALT: 15 U/L (ref 0–55)
AST: 25 U/L (ref 5–34)
Albumin: 3.6 g/dL (ref 3.5–5.0)
Alkaline Phosphatase: 42 U/L (ref 40–150)
Anion Gap: 11 mEq/L (ref 3–11)
BUN: 17.8 mg/dL (ref 7.0–26.0)
CO2: 25 mEq/L (ref 22–29)
Calcium: 9.6 mg/dL (ref 8.4–10.4)
Chloride: 106 mEq/L (ref 98–109)
Creatinine: 0.8 mg/dL (ref 0.6–1.1)
EGFR: 74 mL/min/{1.73_m2} — ABNORMAL LOW (ref >90)
Glucose: 90 mg/dl (ref 70–140)
Potassium: 3.8 mEq/L (ref 3.5–5.1)
Sodium: 142 mEq/L (ref 136–145)
Total Bilirubin: 0.65 mg/dL (ref 0.20–1.20)
Total Protein: 6.7 g/dL (ref 6.4–8.3)

## 2016-01-08 LAB — CBC WITH DIFFERENTIAL/PLATELET
BASO%: 0.6 % (ref 0.0–2.0)
Basophils Absolute: 0 10*3/uL (ref 0.0–0.1)
EOS%: 1 % (ref 0.0–7.0)
Eosinophils Absolute: 0 10*3/uL (ref 0.0–0.5)
HCT: 34.1 % — ABNORMAL LOW (ref 34.8–46.6)
HGB: 11.2 g/dL — ABNORMAL LOW (ref 11.6–15.9)
LYMPH%: 22.9 % (ref 14.0–49.7)
MCH: 33.4 pg (ref 25.1–34.0)
MCHC: 32.8 g/dL (ref 31.5–36.0)
MCV: 101.8 fL — ABNORMAL HIGH (ref 79.5–101.0)
MONO#: 0.6 10*3/uL (ref 0.1–0.9)
MONO%: 16 % — ABNORMAL HIGH (ref 0.0–14.0)
NEUT#: 2.2 10*3/uL (ref 1.5–6.5)
NEUT%: 59.5 % (ref 38.4–76.8)
Platelets: 155 10*3/uL (ref 145–400)
RBC: 3.35 10*6/uL — ABNORMAL LOW (ref 3.70–5.45)
RDW: 15.2 % — ABNORMAL HIGH (ref 11.2–14.5)
WBC: 3.8 10*3/uL — ABNORMAL LOW (ref 3.9–10.3)
lymph#: 0.9 10*3/uL (ref 0.9–3.3)

## 2016-01-08 MED ORDER — DEXAMETHASONE 4 MG PO TABS: ORAL_TABLET | ORAL | 0 refills | Status: DC

## 2016-01-08 NOTE — Telephone Encounter (Signed)
Added Oct appt per [facility] LOS

## 2016-01-08 NOTE — Progress Notes (Signed)
OFFICE PROGRESS NOTE   January 08, 2016   Physicians: P.Gehrig, J.Kinard, (T.Brackbill) J.Russo, S.Ganem, [facility]  INTERVAL HISTORY:  Patient is seen, alone for visit, in continuing attention to chemotherapy ongoing for metastatic endometrial cancer in L3-4 region, due day 1 cycle 6 carbo taxol on 01-09-16. Plan is to repeat PET after cycle 6, shortly prior to seeing Dr Alycia Rossetti next on 02-14-16.  Patient enjoyed beach trip with family last 2 weeks, stamina not quite to baseline but still able to do regular activities. Appetite adequate, no nausea or vomiting. No back pain different from her chronic scoliosis- related symptoms and no other pain. Bowels ok. No fever or symptoms of infection. Slight, persistent peripheral neuropathy not interfering with activity as yet. No SOB or other respiratory symptoms. No fever or symptoms of infection. No problems with PAC. Remainder of 10 point Review of Systems negative  PAC placed by IR on 09-13-15 Biopsy of recurrence 06-2014 ER negative. No genetics testing   ONCOLOGIC HISTORY BREAST CANCER: microinvasive T1N0 right breast cancer which was ER/PR positive and HER-2 2+ with insufficient material for FISH at diagnosis in 03/1999, treated with lumpectomy with 2 sentinel node evaluation, local radiation and 5 years of Tamoxifen thru 05/2004, then aromatase inhibitor 07/2004 thru 04/2006.   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 She had 6 cycles of taxol/carboplatin through Jan 2010, and vaginal cuff brachytherapy. She did well until some vague LUQ symptoms in Nov. 2012, with CT AP in [facility] system Mar 22, 2011 showing left pelvic sidewall involvement. She had laparoscopic evaluation at [facility] by [place] 04-25-2011, with finding of dense fibrosis in the area as well as mass encasing obturator nerve and vein as  well as internal iliac vasculature; left ureterolysis was performed. No pathology was submitted from that procedure. She received 3 additional cycles of taxol/ carboplatin from 05-16-2011 thru 06-28-2011. CT AP on 07-17-2011 showed improvement in the left pelvic sidewall mass now 1.9 x 2 cm and 3.4 cm cephalocaudad, as compared with 2.5 x 2.2 cm and 5.4 cm on CT 03-22-11, and no progressive disease elsewhere. She saw Dr.Gehrig after the CT, recommendation for an additonal 3 cycles of same chemotherapy . Cycle 4 was taxol + carbo and neulasta, cycle 5 taxol only due to counts and cycle 6 taxol + carboplatin on 09-17-11. Follow up CT in May 2013 showed enlargement of the left pelvic sidewall involvement. She had IMRT 50 GY in 28 fractions by Dr Sondra Come, that completed 12-05-11. She had regular follow up scans, Including CT AP 01-2014 and PET 02-2013, which did not show progressive disease. She has scoliosis and some chronic intermittent low back discomfort, but developed different, persistent discomfort low back and radiating to LLE by ~ Dec into Jan 2016. MRI lumbar spine at [facility] on 06-13-2014 showed known scoliosis and degenerative disc disease, with new finding of 2.7 x 2.4 x 1.9 cm soft tissue mass at L3-4 involving left neural foramen, with focal cortical destruction of adjacent L3 vertebral body and portion of left L4 pedicle. She had CT biopsy on 06-21-14, with pathology ([id number]) poorly differentiated carcinoma consistent with high grade serous. Bone scan 06-22-14 had uptake posteriorly in upper lumbar spine and a subtle increased area of uptake right greater trochanter of unclear significance. IMRT by Dr Sondra Come 2-29 thru 08-12-14. Zometa x4 07-07-14 thru 10-2014. Progression at L1-2 and possible RLL lung nodule found  on PET 04-2015 and CT CAP 06-2015. She began dose dense carbo taxol on 08-03-15. She was neutropenic by day 15 cycle 1, ANC 0.5 and granix added. PET 11-06-15,after 3 cycles doses dense carbo  taxol , had slight improvement in hypermetabolic uptake at J5-7, no change size/ no hypermetabolism 1.3 cm RLL pulmonary nodule, resolution of L2-3 area and no correlate to slight hypermetabolism at T11-12. Plan to continue dose dense carbo taxol an additional 3 cycles.  PDL-1 testing from 06-2014 pathology done 10-2015 had low expression of PDL-1, at only 2%.     Objective:  Vital signs in last 24 hours:  BP (!) 154/73 (BP Location: Left Arm, Patient Position: Sitting)   Pulse 68   Temp 98.7 F (37.1 C) (Oral)   Resp 18   Ht '4\' 11"'$  (1.499 m)   Wt 106 lb 14.4 oz (48.5 kg)   SpO2 100%   BMI 21.59 kg/m  Weight stable. Looks comfortable, respirations not labored Alert, oriented and appropriate. Ambulatory without difficulty, able to get on and off exam table easily  Alopecia  HEENT:PERRL, sclerae not icteric. Oral mucosa moist without lesions, posterior pharynx clear.  Neck supple. No JVD.  Lymphatics:no cervical,supraclavicular or inguinal adenopathy Resp: clear to auscultation bilaterally and normal percussion bilaterally Cardio: regular rate and rhythm. No gallop. GI: soft, nontender, not distended, no mass or organomegaly. Some bowel sounds. Surgical incision not remarkable. Musculoskeletal/ Extremities:LE  without pitting edema, cords, tenderness. Scoliosis obvious, spine not tender Neuro: slight peripheral neuropathy fingertips and distal feet. Otherwise nonfocal. PSYCH appropriate mood and affect Skin without rash, ecchymosis, petechiae Portacath-without erythema or tenderness  Lab Results:  Results for orders placed or performed in visit on 01/08/16  CBC with Differential  Result Value Ref Range   WBC 3.8 (L) 3.9 - 10.3 10e3/uL   NEUT# 2.2 1.5 - 6.5 10e3/uL   HGB 11.2 (L) 11.6 - 15.9 g/dL   HCT 34.1 (L) 34.8 - 46.6 %   Platelets 155 145 - 400 10e3/uL   MCV 101.8 (H) 79.5 - 101.0 fL   MCH 33.4 25.1 - 34.0 pg   MCHC 32.8 31.5 - 36.0 g/dL   RBC 3.35 (L) 3.70 - 5.45  10e6/uL   RDW 15.2 (H) 11.2 - 14.5 %   lymph# 0.9 0.9 - 3.3 10e3/uL   MONO# 0.6 0.1 - 0.9 10e3/uL   Eosinophils Absolute 0.0 0.0 - 0.5 10e3/uL   Basophils Absolute 0.0 0.0 - 0.1 10e3/uL   NEUT% 59.5 38.4 - 76.8 %   LYMPH% 22.9 14.0 - 49.7 %   MONO% 16.0 (H) 0.0 - 14.0 %   EOS% 1.0 0.0 - 7.0 %   BASO% 0.6 0.0 - 2.0 %  Comprehensive metabolic panel  Result Value Ref Range   Sodium 142 136 - 145 mEq/L   Potassium 3.8 3.5 - 5.1 mEq/L   Chloride 106 98 - 109 mEq/L   CO2 25 22 - 29 mEq/L   Glucose 90 70 - 140 mg/dl   BUN 17.8 7.0 - 26.0 mg/dL   Creatinine 0.8 0.6 - 1.1 mg/dL   Total Bilirubin 0.65 0.20 - 1.20 mg/dL   Alkaline Phosphatase 42 40 - 150 U/L   AST 25 5 - 34 U/L   ALT 15 0 - 55 U/L   Total Protein 6.7 6.4 - 8.3 g/dL   Albumin 3.6 3.5 - 5.0 g/dL   Calcium 9.6 8.4 - 10.4 mg/dL   Anion Gap 11 3 - 11 mEq/L   EGFR 74 (  L) >90 ml/min/1.73 m2     Studies/Results:  No results found.  Medications: I have reviewed the patient's current medications. Granix days 2,3,9,16 Carbo skin test,  AUC =4 due to cytopenias  DISCUSSION  Clinically continues to tolerate present treatment adequately to continue cycle 6 as planned, then will get restaging PET in Oct for reevaluation.  PET necessary due to involvement in area of scoliosis and previous radiation, as CT imaging not adequately sensitive with this.   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 PET slightly better after 3 cycles of dose dense carbo taxol which she is tolerating, plan to continue thru cycle 6  then rescan. Carbo skin tests. Granix support.  PDL-1 only 2%. Next apt Dr Alycia Rossetti 02-14-16  2.T1N0 right breast cancer 03-1999: adjuvant therapy included 5 years of tamoxifen. Not recurrent.  Last mammograms [facility] 10-18-14, tomo due to dense breast tissue. By other recent imaging, nothing of concern in breasts 3.scoliosis of lumbar spine with degenerative disc disease and chronic symptoms related to this, stable 4.HTN followed by Dr Virgina Jock. She had difficulty with prior chemo with lower BP and tachycardia also, and hypotension. Holding atenolol and amlodipine prn, pushing fluids.  5. Chemo neutropenia/ chemo leukopenia: related to total amount of chemo and radiation. Granix.  6.mild anemia multifactorial including chemo anemia. Hgb up over a gram since chemo break, iron not markedly low on 10-19-15. Follow 7.Marland KitchenPAC placed by IR on 09-13-15 8. Chemo peripheral neuropathy feet> hands, slight increase with recent taxol tho does not seem very bothersome now and patient in agreement with continuing that drug. 9.possible lactose intolerance: abdominal bloating and gaseousness initially improved off of milk products, but resumed later without clear worsening. 10.plantar fasciitis: improved    All questions answered and she knows to call if any concerns. Chemo and granix orders confirmed. PET ordered, message to managed care re preauth. Time spent 25 min including >50% counseling and coordination of care.    Evlyn Clines, MD   01/08/2016, 10:35 AM

## 2016-01-09 ENCOUNTER — Other Ambulatory Visit: Payer: Medicare Other

## 2016-01-09 ENCOUNTER — Ambulatory Visit (HOSPITAL_BASED_OUTPATIENT_CLINIC_OR_DEPARTMENT_OTHER): Payer: Medicare Other

## 2016-01-09 VITALS — BP 150/65 | HR 65 | Temp 98.0°F | Resp 18

## 2016-01-09 DIAGNOSIS — C541 Malignant neoplasm of endometrium: Secondary | ICD-10-CM | POA: Diagnosis not present

## 2016-01-09 DIAGNOSIS — Z5111 Encounter for antineoplastic chemotherapy: Secondary | ICD-10-CM | POA: Diagnosis not present

## 2016-01-09 DIAGNOSIS — C549 Malignant neoplasm of corpus uteri, unspecified: Secondary | ICD-10-CM

## 2016-01-09 MED ORDER — DIPHENHYDRAMINE HCL 50 MG/ML IJ SOLN
INTRAMUSCULAR | Status: AC
  Filled 2016-01-09: qty 1

## 2016-01-09 MED ORDER — HEPARIN SOD (PORK) LOCK FLUSH 100 UNIT/ML IV SOLN
500.0000 [IU] | Freq: Once | INTRAVENOUS | Status: AC | PRN
  Administered 2016-01-09: 500 [IU]
  Filled 2016-01-09: qty 5

## 2016-01-09 MED ORDER — FAMOTIDINE IN NACL 20-0.9 MG/50ML-% IV SOLN
INTRAVENOUS | Status: AC
  Filled 2016-01-09: qty 50

## 2016-01-09 MED ORDER — SODIUM CHLORIDE 0.9% FLUSH
10.0000 mL | INTRAVENOUS | Status: DC | PRN
  Administered 2016-01-09: 10 mL
  Filled 2016-01-09: qty 10

## 2016-01-09 MED ORDER — SODIUM CHLORIDE 0.9 % IV SOLN
Freq: Once | INTRAVENOUS | Status: AC
  Administered 2016-01-09: 13:00:00 via INTRAVENOUS
  Filled 2016-01-09: qty 8

## 2016-01-09 MED ORDER — SODIUM CHLORIDE 0.9 % IV SOLN
254.4000 mg | Freq: Once | INTRAVENOUS | Status: AC
  Administered 2016-01-09: 250 mg via INTRAVENOUS
  Filled 2016-01-09: qty 25

## 2016-01-09 MED ORDER — DIPHENHYDRAMINE HCL 50 MG/ML IJ SOLN
12.5000 mg | Freq: Once | INTRAMUSCULAR | Status: AC
  Administered 2016-01-09: 12.5 mg via INTRAVENOUS

## 2016-01-09 MED ORDER — SODIUM CHLORIDE 0.9 % IV SOLN
80.0000 mg/m2 | Freq: Once | INTRAVENOUS | Status: AC
  Administered 2016-01-09: 114 mg via INTRAVENOUS
  Filled 2016-01-09: qty 19

## 2016-01-09 MED ORDER — FAMOTIDINE IN NACL 20-0.9 MG/50ML-% IV SOLN
20.0000 mg | Freq: Once | INTRAVENOUS | Status: AC
  Administered 2016-01-09: 20 mg via INTRAVENOUS

## 2016-01-09 MED ORDER — CARBOPLATIN CHEMO INTRADERMAL TEST DOSE 100MCG/0.02ML
100.0000 ug | Freq: Once | INTRADERMAL | Status: AC
  Administered 2016-01-09: 100 ug via INTRADERMAL
  Filled 2016-01-09: qty 0.01

## 2016-01-09 NOTE — Patient Instructions (Signed)
[facility] Discharge Instructions for Patients Receiving Chemotherapy  Today you received the following chemotherapy agents :  Taxol,  Carboplatin.  To help prevent nausea and vomiting after your treatment, we encourage you to take your nausea medication as prescribed.   If you develop nausea and vomiting that is not controlled by your nausea medication, call the clinic.   BELOW ARE SYMPTOMS THAT SHOULD BE REPORTED IMMEDIATELY:  *FEVER GREATER THAN 100.5 F  *CHILLS WITH OR WITHOUT FEVER  NAUSEA AND VOMITING THAT IS NOT CONTROLLED WITH YOUR NAUSEA MEDICATION  *UNUSUAL SHORTNESS OF BREATH  *UNUSUAL BRUISING OR BLEEDING  TENDERNESS IN MOUTH AND THROAT WITH OR WITHOUT PRESENCE OF ULCERS  *URINARY PROBLEMS  *BOWEL PROBLEMS  UNUSUAL RASH Items with * indicate a potential emergency and should be followed up as soon as possible.  Feel free to call the clinic you have any questions or concerns. The clinic phone number is [phone].  Please show the CHEMO ALERT CARD at check-in to the Emergency Department and triage nurse.   

## 2016-01-10 ENCOUNTER — Ambulatory Visit (HOSPITAL_BASED_OUTPATIENT_CLINIC_OR_DEPARTMENT_OTHER): Payer: Self-pay

## 2016-01-10 VITALS — BP 148/68 | HR 61 | Temp 97.9°F | Resp 20

## 2016-01-10 DIAGNOSIS — T451X5A Adverse effect of antineoplastic and immunosuppressive drugs, initial encounter: Secondary | ICD-10-CM

## 2016-01-10 DIAGNOSIS — C7951 Secondary malignant neoplasm of bone: Secondary | ICD-10-CM

## 2016-01-10 DIAGNOSIS — Z95828 Presence of other vascular implants and grafts: Secondary | ICD-10-CM

## 2016-01-10 DIAGNOSIS — D701 Agranulocytosis secondary to cancer chemotherapy: Secondary | ICD-10-CM

## 2016-01-10 MED ORDER — TBO-FILGRASTIM 480 MCG/0.8ML ~~LOC~~ SOSY
480.0000 ug | PREFILLED_SYRINGE | Freq: Once | SUBCUTANEOUS | Status: DC
Start: 2016-01-10 — End: 2016-01-10
  Filled 2016-01-10: qty 0.8

## 2016-01-10 NOTE — Patient Instructions (Signed)

## 2016-01-11 ENCOUNTER — Ambulatory Visit (HOSPITAL_BASED_OUTPATIENT_CLINIC_OR_DEPARTMENT_OTHER): Payer: Medicare Other

## 2016-01-11 VITALS — BP 142/66 | HR 63 | Temp 98.5°F | Resp 18

## 2016-01-11 DIAGNOSIS — Z95828 Presence of other vascular implants and grafts: Secondary | ICD-10-CM

## 2016-01-11 DIAGNOSIS — C7951 Secondary malignant neoplasm of bone: Secondary | ICD-10-CM | POA: Diagnosis not present

## 2016-01-11 DIAGNOSIS — D701 Agranulocytosis secondary to cancer chemotherapy: Secondary | ICD-10-CM | POA: Diagnosis present

## 2016-01-11 DIAGNOSIS — T451X5A Adverse effect of antineoplastic and immunosuppressive drugs, initial encounter: Secondary | ICD-10-CM

## 2016-01-11 MED ORDER — TBO-FILGRASTIM 480 MCG/0.8ML ~~LOC~~ SOSY
480.0000 ug | PREFILLED_SYRINGE | Freq: Once | SUBCUTANEOUS | Status: AC
  Administered 2016-01-11: 480 ug via SUBCUTANEOUS
  Filled 2016-01-11: qty 0.8

## 2016-01-11 NOTE — Patient Instructions (Signed)

## 2016-01-16 ENCOUNTER — Ambulatory Visit: Payer: Medicare Other

## 2016-01-16 ENCOUNTER — Other Ambulatory Visit (HOSPITAL_BASED_OUTPATIENT_CLINIC_OR_DEPARTMENT_OTHER): Payer: Medicare Other

## 2016-01-16 ENCOUNTER — Ambulatory Visit (HOSPITAL_BASED_OUTPATIENT_CLINIC_OR_DEPARTMENT_OTHER): Payer: Medicare Other

## 2016-01-16 VITALS — BP 134/62 | HR 62 | Temp 97.8°F | Resp 18

## 2016-01-16 DIAGNOSIS — C7951 Secondary malignant neoplasm of bone: Secondary | ICD-10-CM

## 2016-01-16 DIAGNOSIS — Z5111 Encounter for antineoplastic chemotherapy: Secondary | ICD-10-CM

## 2016-01-16 DIAGNOSIS — Z95828 Presence of other vascular implants and grafts: Secondary | ICD-10-CM

## 2016-01-16 DIAGNOSIS — C541 Malignant neoplasm of endometrium: Secondary | ICD-10-CM

## 2016-01-16 DIAGNOSIS — C549 Malignant neoplasm of corpus uteri, unspecified: Secondary | ICD-10-CM

## 2016-01-16 LAB — COMPREHENSIVE METABOLIC PANEL
ALT: 19 U/L (ref 0–55)
AST: 24 U/L (ref 5–34)
Albumin: 3.6 g/dL (ref 3.5–5.0)
Alkaline Phosphatase: 50 U/L (ref 40–150)
Anion Gap: 10 mEq/L (ref 3–11)
BUN: 20.8 mg/dL (ref 7.0–26.0)
CO2: 23 mEq/L (ref 22–29)
Calcium: 9.5 mg/dL (ref 8.4–10.4)
Chloride: 107 mEq/L (ref 98–109)
Creatinine: 0.7 mg/dL (ref 0.6–1.1)
EGFR: 79 mL/min/{1.73_m2} — ABNORMAL LOW (ref >90)
Glucose: 120 mg/dl (ref 70–140)
Potassium: 4.6 mEq/L (ref 3.5–5.1)
Sodium: 140 mEq/L (ref 136–145)
Total Bilirubin: 0.72 mg/dL (ref 0.20–1.20)
Total Protein: 6.7 g/dL (ref 6.4–8.3)

## 2016-01-16 LAB — CBC WITH DIFFERENTIAL/PLATELET
BASO%: 0 % (ref 0.0–2.0)
Basophils Absolute: 0 10*3/uL (ref 0.0–0.1)
EOS%: 0 % (ref 0.0–7.0)
Eosinophils Absolute: 0 10*3/uL (ref 0.0–0.5)
HCT: 31.9 % — ABNORMAL LOW (ref 34.8–46.6)
HGB: 10.6 g/dL — ABNORMAL LOW (ref 11.6–15.9)
LYMPH%: 8.6 % — ABNORMAL LOW (ref 14.0–49.7)
MCH: 33.5 pg (ref 25.1–34.0)
MCHC: 33.3 g/dL (ref 31.5–36.0)
MCV: 100.8 fL (ref 79.5–101.0)
MONO#: 0.1 10*3/uL (ref 0.1–0.9)
MONO%: 2.1 % (ref 0.0–14.0)
NEUT#: 3.8 10*3/uL (ref 1.5–6.5)
NEUT%: 89.3 % — ABNORMAL HIGH (ref 38.4–76.8)
Platelets: 142 10*3/uL — ABNORMAL LOW (ref 145–400)
RBC: 3.17 10*6/uL — ABNORMAL LOW (ref 3.70–5.45)
RDW: 15 % — ABNORMAL HIGH (ref 11.2–14.5)
WBC: 4.3 10*3/uL (ref 3.9–10.3)
lymph#: 0.4 10*3/uL — ABNORMAL LOW (ref 0.9–3.3)

## 2016-01-16 MED ORDER — DIPHENHYDRAMINE HCL 50 MG/ML IJ SOLN
12.5000 mg | Freq: Once | INTRAMUSCULAR | Status: AC
  Administered 2016-01-16: 12.5 mg via INTRAVENOUS

## 2016-01-16 MED ORDER — SODIUM CHLORIDE 0.9 % IV SOLN
Freq: Once | INTRAVENOUS | Status: AC
  Administered 2016-01-16: 12:00:00 via INTRAVENOUS
  Filled 2016-01-16: qty 4

## 2016-01-16 MED ORDER — FAMOTIDINE IN NACL 20-0.9 MG/50ML-% IV SOLN
INTRAVENOUS | Status: AC
  Filled 2016-01-16: qty 50

## 2016-01-16 MED ORDER — HEPARIN SOD (PORK) LOCK FLUSH 100 UNIT/ML IV SOLN
500.0000 [IU] | Freq: Once | INTRAVENOUS | Status: AC | PRN
  Administered 2016-01-16: 500 [IU] via INTRAVENOUS
  Filled 2016-01-16: qty 5

## 2016-01-16 MED ORDER — DIPHENHYDRAMINE HCL 50 MG/ML IJ SOLN
INTRAMUSCULAR | Status: AC
  Filled 2016-01-16: qty 1

## 2016-01-16 MED ORDER — SODIUM CHLORIDE 0.9 % IV SOLN
80.0000 mg/m2 | Freq: Once | INTRAVENOUS | Status: AC
  Administered 2016-01-16: 114 mg via INTRAVENOUS
  Filled 2016-01-16: qty 19

## 2016-01-16 MED ORDER — SODIUM CHLORIDE 0.9 % IJ SOLN
10.0000 mL | INTRAMUSCULAR | Status: DC | PRN
  Administered 2016-01-16: 10 mL via INTRAVENOUS
  Filled 2016-01-16: qty 10

## 2016-01-16 MED ORDER — FAMOTIDINE IN NACL 20-0.9 MG/50ML-% IV SOLN
20.0000 mg | Freq: Once | INTRAVENOUS | Status: AC
  Administered 2016-01-16: 20 mg via INTRAVENOUS

## 2016-01-16 MED ORDER — SODIUM CHLORIDE 0.9 % IV SOLN
Freq: Once | INTRAVENOUS | Status: AC
  Administered 2016-01-16: 12:00:00 via INTRAVENOUS

## 2016-01-16 NOTE — Patient Instructions (Signed)
[facility] Discharge Instructions for Patients Receiving Chemotherapy  Today you received the following chemotherapy agents Taxol   To help prevent nausea and vomiting after your treatment, we encourage you to take your nausea medication as directed.   If you develop nausea and vomiting that is not controlled by your nausea medication, call the clinic.   BELOW ARE SYMPTOMS THAT SHOULD BE REPORTED IMMEDIATELY:  *FEVER GREATER THAN 100.5 F  *CHILLS WITH OR WITHOUT FEVER  NAUSEA AND VOMITING THAT IS NOT CONTROLLED WITH YOUR NAUSEA MEDICATION  *UNUSUAL SHORTNESS OF BREATH  *UNUSUAL BRUISING OR BLEEDING  TENDERNESS IN MOUTH AND THROAT WITH OR WITHOUT PRESENCE OF ULCERS  *URINARY PROBLEMS  *BOWEL PROBLEMS  UNUSUAL RASH Items with * indicate a potential emergency and should be followed up as soon as possible.  Feel free to call the clinic you have any questions or concerns. The clinic phone number is [phone].  Please show the CHEMO ALERT CARD at check-in to the Emergency Department and triage nurse.   

## 2016-01-17 ENCOUNTER — Ambulatory Visit (HOSPITAL_BASED_OUTPATIENT_CLINIC_OR_DEPARTMENT_OTHER): Payer: Medicare Other

## 2016-01-17 VITALS — BP 138/74 | HR 64 | Temp 98.2°F | Resp 20

## 2016-01-17 DIAGNOSIS — C541 Malignant neoplasm of endometrium: Secondary | ICD-10-CM

## 2016-01-17 DIAGNOSIS — T451X5A Adverse effect of antineoplastic and immunosuppressive drugs, initial encounter: Secondary | ICD-10-CM

## 2016-01-17 DIAGNOSIS — Z95828 Presence of other vascular implants and grafts: Secondary | ICD-10-CM

## 2016-01-17 DIAGNOSIS — D701 Agranulocytosis secondary to cancer chemotherapy: Secondary | ICD-10-CM

## 2016-01-17 DIAGNOSIS — C7951 Secondary malignant neoplasm of bone: Secondary | ICD-10-CM

## 2016-01-17 MED ORDER — TBO-FILGRASTIM 480 MCG/0.8ML ~~LOC~~ SOSY
480.0000 ug | PREFILLED_SYRINGE | Freq: Once | SUBCUTANEOUS | Status: AC
  Administered 2016-01-17: 480 ug via SUBCUTANEOUS
  Filled 2016-01-17: qty 0.8

## 2016-01-17 MED ORDER — ALTEPLASE 2 MG IJ SOLR
2.0000 mg | Freq: Once | INTRAMUSCULAR | Status: DC | PRN
  Filled 2016-01-17: qty 2

## 2016-01-17 NOTE — Patient Instructions (Signed)

## 2016-01-18 ENCOUNTER — Ambulatory Visit: Payer: Medicare Other

## 2016-01-21 ENCOUNTER — Other Ambulatory Visit: Payer: Self-pay | Admitting: Oncology

## 2016-01-22 ENCOUNTER — Ambulatory Visit (HOSPITAL_BASED_OUTPATIENT_CLINIC_OR_DEPARTMENT_OTHER): Payer: Medicare Other

## 2016-01-22 ENCOUNTER — Ambulatory Visit (HOSPITAL_BASED_OUTPATIENT_CLINIC_OR_DEPARTMENT_OTHER): Payer: Medicare Other | Admitting: Oncology

## 2016-01-22 ENCOUNTER — Encounter: Payer: Self-pay | Admitting: Oncology

## 2016-01-22 ENCOUNTER — Other Ambulatory Visit (HOSPITAL_BASED_OUTPATIENT_CLINIC_OR_DEPARTMENT_OTHER): Payer: Medicare Other

## 2016-01-22 VITALS — BP 137/68 | HR 65 | Temp 98.0°F | Resp 18 | Ht 59.0 in | Wt 107.5 lb

## 2016-01-22 DIAGNOSIS — C7951 Secondary malignant neoplasm of bone: Secondary | ICD-10-CM

## 2016-01-22 DIAGNOSIS — T451X5A Adverse effect of antineoplastic and immunosuppressive drugs, initial encounter: Secondary | ICD-10-CM

## 2016-01-22 DIAGNOSIS — D701 Agranulocytosis secondary to cancer chemotherapy: Secondary | ICD-10-CM

## 2016-01-22 DIAGNOSIS — R530 Neoplastic (malignant) related fatigue: Secondary | ICD-10-CM

## 2016-01-22 DIAGNOSIS — Z95828 Presence of other vascular implants and grafts: Secondary | ICD-10-CM

## 2016-01-22 DIAGNOSIS — G620 Drug-induced polyneuropathy: Secondary | ICD-10-CM

## 2016-01-22 DIAGNOSIS — C541 Malignant neoplasm of endometrium: Secondary | ICD-10-CM

## 2016-01-22 LAB — CBC WITH DIFFERENTIAL/PLATELET
BASO%: 1.9 % (ref 0.0–2.0)
Basophils Absolute: 0 10*3/uL (ref 0.0–0.1)
EOS%: 0.9 % (ref 0.0–7.0)
Eosinophils Absolute: 0 10*3/uL (ref 0.0–0.5)
HCT: 30.7 % — ABNORMAL LOW (ref 34.8–46.6)
HGB: 10.1 g/dL — ABNORMAL LOW (ref 11.6–15.9)
LYMPH%: 35.8 % (ref 14.0–49.7)
MCH: 33 pg (ref 25.1–34.0)
MCHC: 32.9 g/dL (ref 31.5–36.0)
MCV: 100.3 fL (ref 79.5–101.0)
MONO#: 0.3 10*3/uL (ref 0.1–0.9)
MONO%: 14 % (ref 0.0–14.0)
NEUT#: 1 10*3/uL — ABNORMAL LOW (ref 1.5–6.5)
NEUT%: 47.4 % (ref 38.4–76.8)
Platelets: 124 10*3/uL — ABNORMAL LOW (ref 145–400)
RBC: 3.06 10*6/uL — ABNORMAL LOW (ref 3.70–5.45)
RDW: 14.6 % — ABNORMAL HIGH (ref 11.2–14.5)
WBC: 2.2 10*3/uL — ABNORMAL LOW (ref 3.9–10.3)
lymph#: 0.8 10*3/uL — ABNORMAL LOW (ref 0.9–3.3)

## 2016-01-22 LAB — COMPREHENSIVE METABOLIC PANEL
ALT: 21 U/L (ref 0–55)
AST: 23 U/L (ref 5–34)
Albumin: 3.4 g/dL — ABNORMAL LOW (ref 3.5–5.0)
Alkaline Phosphatase: 48 U/L (ref 40–150)
Anion Gap: 9 mEq/L (ref 3–11)
BUN: 21.5 mg/dL (ref 7.0–26.0)
CO2: 24 mEq/L (ref 22–29)
Calcium: 9.3 mg/dL (ref 8.4–10.4)
Chloride: 109 mEq/L (ref 98–109)
Creatinine: 0.7 mg/dL (ref 0.6–1.1)
EGFR: 79 mL/min/{1.73_m2} — ABNORMAL LOW (ref >90)
Glucose: 94 mg/dl (ref 70–140)
Potassium: 4.1 mEq/L (ref 3.5–5.1)
Sodium: 141 mEq/L (ref 136–145)
Total Bilirubin: 0.97 mg/dL (ref 0.20–1.20)
Total Protein: 6.3 g/dL — ABNORMAL LOW (ref 6.4–8.3)

## 2016-01-22 MED ORDER — HEPARIN SOD (PORK) LOCK FLUSH 100 UNIT/ML IV SOLN
500.0000 [IU] | Freq: Once | INTRAVENOUS | Status: AC | PRN
  Administered 2016-01-22: 500 [IU] via INTRAVENOUS
  Filled 2016-01-22: qty 5

## 2016-01-22 MED ORDER — TBO-FILGRASTIM 480 MCG/0.8ML ~~LOC~~ SOSY
480.0000 ug | PREFILLED_SYRINGE | Freq: Once | SUBCUTANEOUS | Status: AC
  Administered 2016-01-22: 480 ug via SUBCUTANEOUS
  Filled 2016-01-22: qty 0.8

## 2016-01-22 MED ORDER — SODIUM CHLORIDE 0.9 % IJ SOLN
10.0000 mL | INTRAMUSCULAR | Status: DC | PRN
  Administered 2016-01-22: 10 mL via INTRAVENOUS
  Filled 2016-01-22: qty 10

## 2016-01-22 NOTE — Progress Notes (Signed)
OFFICE PROGRESS NOTE   January 22, 2016   Physicians:  P.Gehrig, J.Kinard, (T.Brackbill) J.Russo, S.Ganem, [facility]  INTERVAL HISTORY:  Patient is seen, alone for visit, in continuing attention to treatment for metastatic endometrial cancer. ANC is too low for day 15 cycle 6 taxol this week; chemo will be discontinued now at least until restaging PET prior to visit back to Dr Alycia Rossetti on 02-14-16.   Patient has felt somewhat more fatigued in last week or so, including not tolerating walking for exercise yesterday. She has had no fever or symptoms of infection. She notices more peripheral neuropathy feet and into lower legs bilaterally, not bothersome including with activity; she has no significant neuropathy in hands. Appetite is not as good tho she continues to eat, no nausea, bowels moving adequately with miralax now qod. She denies SOB, chest pain or clear cardiac symptoms, abdominal or pelvic discomfort, LE swelling. She has no back discomfort other than her chronic scoliosis symptoms. No problems with PAC. Remander of 10 point Review of Systems negative.    PAC placed by IR on 09-13-15, flushed 01-22-16 Biopsy of recurrence 06-2014 ER negative. No genetics testing   ONCOLOGIC HISTORY  BREAST CANCER: microinvasive T1N0 right breast cancer which was ER/PR positive and HER-2 2+ with insufficient material for FISH at diagnosis in 03/1999, treated with lumpectomy with 2 sentinel node evaluation, local radiation and 5 years of Tamoxifen thru 05/2004, then aromatase inhibitor 07/2004 thru 04/2006.   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 She had 6 cycles of taxol/carboplatin through Jan 2010, and vaginal cuff brachytherapy. She did well until some vague LUQ symptoms in Nov. 2012, with CT AP in [facility] Mar 22, 2011 showing left pelvic  sidewall involvement. She had laparoscopic evaluation at [facility] by [place] 04-25-2011, with finding of dense fibrosis in the area as well as mass encasing obturator nerve and vein as well as internal iliac vasculature; left ureterolysis was performed. No pathology was submitted from that procedure. She received 3 additional cycles of taxol/ carboplatin from 05-16-2011 thru 06-28-2011. CT AP on 07-17-2011 showed improvement in the left pelvic sidewall mass now 1.9 x 2 cm and 3.4 cm cephalocaudad, as compared with 2.5 x 2.2 cm and 5.4 cm on CT 03-22-11, and no progressive disease elsewhere. She saw Dr.Gehrig after the CT, recommendation for an additonal 3 cycles of same chemotherapy . Cycle 4 was taxol + carbo and neulasta, cycle 5 taxol only due to counts and cycle 6 taxol + carboplatin on 09-17-11. Follow up CT in May 2013 showed enlargement of the left pelvic sidewall involvement. She had IMRT 50 GY in 28 fractions by Dr Sondra Come, that completed 12-05-11. She had regular follow up scans, Including CT AP 01-2014 and PET 02-2013, which did not show progressive disease. She has scoliosis and some chronic intermittent low back discomfort, but developed different, persistent discomfort low back and radiating to LLE by ~ Dec into Jan 2016. MRI lumbar spine at [facility] on 06-13-2014 showed known scoliosis and degenerative disc disease, with new finding of 2.7 x 2.4 x 1.9 cm soft tissue mass at L3-4 involving left neural foramen, with focal cortical destruction of adjacent L3 vertebral body and portion of left L4 pedicle. She had CT biopsy on 06-21-14, with pathology ([id number]) poorly differentiated carcinoma consistent with high grade serous. Bone scan 06-22-14 had uptake posteriorly in upper lumbar spine and a  subtle increased area of uptake right greater trochanter of unclear significance. IMRT by Dr Sondra Come 2-29 thru 08-12-14. Zometa x4 07-07-14 thru 10-2014. Progression at L1-2 and possible RLL lung nodule found on PET  04-2015 and CT CAP 06-2015. She began dose dense carbo taxol on 08-03-15. She was neutropenic by day 15 cycle 1, ANC 0.5 and granix added. PET 11-06-15,after 3 cycles doses dense carbo taxol , had slight improvement in hypermetabolic uptake at G4-0, no change size/ no hypermetabolism 1.3 cm RLL pulmonary nodule, resolution of L2-3 area and no correlate to slight hypermetabolism at T11-12. Plan to continue dose dense carbo taxol an additional 3 cycles.  PDL-1 testing from 06-2014 pathology done 10-2015 had low expression of PDL-1, at only 2%.   Objective:  Vital signs in last 24 hours:  BP 137/68 (BP Location: Left Arm, Patient Position: Sitting)   Pulse 65   Temp 98 F (36.7 C) (Oral)   Resp 18   Ht '4\' 11"'$  (1.499 m)   Wt 107 lb 8 oz (48.8 kg)   SpO2 100%   BMI 21.71 kg/m  Weight stable Alert, oriented and appropriate. Ambulatory and able to get on and off exam table without difficulty  HEENT:PERRL, sclerae not icteric. Oral mucosa moist without lesions, posterior pharynx clear.  Neck supple. No JVD.  Lymphatics:no cervical,supraclavicular or inguinal adenopathy Resp: clear to auscultation bilaterally and normal percussion bilaterally Cardio: regular rate and rhythm. No gallop. GI: soft, nontender, not distended, no mass or organomegaly. Normally active bowel sounds. Surgical incisions not remarkable. Musculoskeletal/ Extremities: without pitting edema, cords, tenderness Neuro: no peripheral neuropathy. Otherwise nonfocal Skin without rash, ecchymosis, petechiae Portacath-without erythema or tenderness  Lab Results:  Results for orders placed or performed in visit on 01/22/16  CBC with Differential  Result Value Ref Range   WBC 2.2 (L) 3.9 - 10.3 10e3/uL   NEUT# 1.0 (L) 1.5 - 6.5 10e3/uL   HGB 10.1 (L) 11.6 - 15.9 g/dL   HCT 30.7 (L) 34.8 - 46.6 %   Platelets 124 (L) 145 - 400 10e3/uL   MCV 100.3 79.5 - 101.0 fL   MCH 33.0 25.1 - 34.0 pg   MCHC 32.9 31.5 - 36.0 g/dL   RBC 3.06  (L) 3.70 - 5.45 10e6/uL   RDW 14.6 (H) 11.2 - 14.5 %   lymph# 0.8 (L) 0.9 - 3.3 10e3/uL   MONO# 0.3 0.1 - 0.9 10e3/uL   Eosinophils Absolute 0.0 0.0 - 0.5 10e3/uL   Basophils Absolute 0.0 0.0 - 0.1 10e3/uL   NEUT% 47.4 38.4 - 76.8 %   LYMPH% 35.8 14.0 - 49.7 %   MONO% 14.0 0.0 - 14.0 %   EOS% 0.9 0.0 - 7.0 %   BASO% 1.9 0.0 - 2.0 %  Comprehensive metabolic panel  Result Value Ref Range   Sodium 141 136 - 145 mEq/L   Potassium 4.1 3.5 - 5.1 mEq/L   Chloride 109 98 - 109 mEq/L   CO2 24 22 - 29 mEq/L   Glucose 94 70 - 140 mg/dl   BUN 21.5 7.0 - 26.0 mg/dL   Creatinine 0.7 0.6 - 1.1 mg/dL   Total Bilirubin 0.97 0.20 - 1.20 mg/dL   Alkaline Phosphatase 48 40 - 150 U/L   AST 23 5 - 34 U/L   ALT 21 0 - 55 U/L   Total Protein 6.3 (L) 6.4 - 8.3 g/dL   Albumin 3.4 (L) 3.5 - 5.0 g/dL   Calcium 9.3 8.4 - 10.4 mg/dL  Anion Gap 9 3 - 11 mEq/L   EGFR 79 (L) >90 ml/min/1.73 m2     Studies/Results:  No results found.  Medications: I have reviewed the patient's current medications. Granix given today.  DISCUSSION ANC too low to treat day 15 cycle 6 chemo today, and with, progressive peripheral neuropathy feet + fatigue , I have recommended that we stop this chemo now at least until reevaluation with PET upcoming. Patient understands and is in agreement with this plan.   Message sent again to [facility] managed care re PET authorization and scheduling.   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  She does need flu vaccine this fall, preferably when off chemo.  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 PET slightly better after 3 cycles of dose dense carbo  taxol. Will stop chemo for now post day 8 cycle 6, anticipate PET ~ 02-12-16 prior to seeing Dr Alycia Rossetti on 02-14-16. PDL-1 only 2%. Next apt Dr Alycia Rossetti 02-14-16  2.T1N0 right breast cancer 03-1999: adjuvant therapy included 5 years of tamoxifen. Not recurrent. Last mammograms at [facility] 10-18-14, tomo due to dense breast tissue. By other recent imaging, nothing of concern in breasts. Will reschedule mammograms based on situation after upcoming reevaluation 3.scoliosis of lumbar spine with degenerative disc disease and chronic symptoms related to this, stable 4.HTN followed by Dr Virgina Jock. She had difficulty with prior chemo with lower BP and tachycardia also, and hypotension. Holding atenolol and amlodipine prn, pushing fluids.  5. Chemo neutropenia/ chemo leukopenia: related to total amount of chemo and radiation. Granix.  6.mild anemia multifactorial including chemo anemia. Hgb up over a gram since chemo break, iron not markedly low on 10-19-15. Follow 7.Marland KitchenPAC placed by IR on 09-13-15 8. Chemo peripheral neuropathy feet> hands, slight increase with recent taxol. She understands that this may improve or hopefully resolve, tho at times symptoms from the taxol do not resolve entirely. She does not want medication for symptoms now. 9.possible lactose intolerance: abdominal bloating and gaseousness initially improved off of milk products, but resumed later without clear worsening. 10.plantar fasciitis: improved    All questions answered and she knows to call if concerns prior to next scheduled visit. Time spent 25 min including >50% counseling and coordination of care. Route Dr Virgina Jock, in EMR for Dr Elenora Gamma visit.     Evlyn Clines, MD   01/22/2016, 11:05 AM

## 2016-01-23 ENCOUNTER — Ambulatory Visit: Payer: Medicare Other

## 2016-01-23 ENCOUNTER — Other Ambulatory Visit: Payer: Medicare Other

## 2016-01-24 ENCOUNTER — Ambulatory Visit: Payer: Medicare Other

## 2016-02-12 ENCOUNTER — Encounter (HOSPITAL_COMMUNITY)
Admission: RE | Admit: 2016-02-12 | Discharge: 2016-02-12 | Disposition: A | Discharge status: Home / Self Care | Payer: Medicare Other | Source: Ambulatory Visit | Attending: Oncology | Admitting: Oncology

## 2016-02-12 DIAGNOSIS — C541 Malignant neoplasm of endometrium: Secondary | ICD-10-CM | POA: Diagnosis present

## 2016-02-12 LAB — GLUCOSE, CAPILLARY: Glucose-Capillary: 95 mg/dL (ref 65–99)

## 2016-02-12 MED ORDER — FLUDEOXYGLUCOSE F - 18 (FDG) INJECTION
5.3000 | Freq: Once | INTRAVENOUS | Status: AC | PRN
  Administered 2016-02-12: 5.3 via INTRAVENOUS

## 2016-02-13 ENCOUNTER — Encounter: Payer: Self-pay | Admitting: *Deleted

## 2016-02-14 ENCOUNTER — Encounter: Payer: Self-pay | Admitting: Gynecologic Oncology

## 2016-02-14 ENCOUNTER — Other Ambulatory Visit: Payer: Self-pay | Admitting: Gynecologic Oncology

## 2016-02-14 ENCOUNTER — Ambulatory Visit: Payer: Medicare Other | Attending: Gynecologic Oncology | Admitting: Gynecologic Oncology

## 2016-02-14 VITALS — BP 155/69 | HR 71 | Temp 98.3°F | Resp 18 | Ht 59.0 in | Wt 109.8 lb

## 2016-02-14 DIAGNOSIS — K5730 Diverticulosis of large intestine without perforation or abscess without bleeding: Secondary | ICD-10-CM | POA: Insufficient documentation

## 2016-02-14 DIAGNOSIS — K8020 Calculus of gallbladder without cholecystitis without obstruction: Secondary | ICD-10-CM | POA: Diagnosis not present

## 2016-02-14 DIAGNOSIS — Z885 Allergy status to narcotic agent status: Secondary | ICD-10-CM | POA: Insufficient documentation

## 2016-02-14 DIAGNOSIS — Z888 Allergy status to other drugs, medicaments and biological substances status: Secondary | ICD-10-CM | POA: Diagnosis not present

## 2016-02-14 DIAGNOSIS — Z853 Personal history of malignant neoplasm of breast: Secondary | ICD-10-CM | POA: Diagnosis not present

## 2016-02-14 DIAGNOSIS — Z90710 Acquired absence of both cervix and uterus: Secondary | ICD-10-CM | POA: Insufficient documentation

## 2016-02-14 DIAGNOSIS — Z9221 Personal history of antineoplastic chemotherapy: Secondary | ICD-10-CM | POA: Diagnosis not present

## 2016-02-14 DIAGNOSIS — Z87891 Personal history of nicotine dependence: Secondary | ICD-10-CM | POA: Insufficient documentation

## 2016-02-14 DIAGNOSIS — Z7982 Long term (current) use of aspirin: Secondary | ICD-10-CM | POA: Diagnosis not present

## 2016-02-14 DIAGNOSIS — C541 Malignant neoplasm of endometrium: Secondary | ICD-10-CM | POA: Diagnosis not present

## 2016-02-14 DIAGNOSIS — Z923 Personal history of irradiation: Secondary | ICD-10-CM | POA: Insufficient documentation

## 2016-02-14 DIAGNOSIS — I493 Ventricular premature depolarization: Secondary | ICD-10-CM | POA: Diagnosis not present

## 2016-02-14 DIAGNOSIS — Z90722 Acquired absence of ovaries, bilateral: Secondary | ICD-10-CM | POA: Insufficient documentation

## 2016-02-14 DIAGNOSIS — E785 Hyperlipidemia, unspecified: Secondary | ICD-10-CM | POA: Diagnosis not present

## 2016-02-14 DIAGNOSIS — Z8249 Family history of ischemic heart disease and other diseases of the circulatory system: Secondary | ICD-10-CM | POA: Diagnosis not present

## 2016-02-14 NOTE — Patient Instructions (Signed)
Bevacizumab injection What is this medicine? BEVACIZUMAB (be va SIZ yoo mab) is a monoclonal antibody. It is used to treat cervical cancer, colorectal cancer, glioblastoma multiforme, non-small cell lung cancer (NSCLC), ovarian cancer, and renal cell cancer. This medicine may be used for other purposes; ask your health care provider or pharmacist if you have questions. What should I tell my health care provider before I take this medicine? They need to know if you have any of these conditions: -blood clots -heart disease, including heart failure, heart attack, or chest pain (angina) -high blood pressure -infection (especially a virus infection such as chickenpox, cold sores, or herpes) -kidney disease -lung disease -prior chemotherapy with doxorubicin, daunorubicin, epirubicin, or other anthracycline type chemotherapy agents -recent or ongoing radiation therapy -recent surgery -stroke -an unusual or allergic reaction to bevacizumab, hamster proteins, mouse proteins, other medicines, foods, dyes, or preservatives -pregnant or trying to get pregnant -breast-feeding How should I use this medicine? This medicine is for infusion into a vein. It is given by a health care professional in a hospital or clinic setting. Talk to your pediatrician regarding the use of this medicine in children. Special care may be needed. Overdosage: If you think you have taken too much of this medicine contact a poison control center or emergency room at once. NOTE: This medicine is only for you. Do not share this medicine with others. What if I miss a dose? It is important not to miss your dose. Call your doctor or health care professional if you are unable to keep an appointment. What may interact with this medicine? Interactions are not expected. This list may not describe all possible interactions. Give your health care provider a list of all the medicines, herbs, non-prescription drugs, or dietary supplements  you use. Also tell them if you smoke, drink alcohol, or use illegal drugs. Some items may interact with your medicine. What should I watch for while using this medicine? Your condition will be monitored carefully while you are receiving this medicine. You will need important blood work and urine testing done while you are taking this medicine. During your treatment, let your health care professional know if you have any unusual symptoms, such as difficulty breathing. This medicine may rarely cause 'gastrointestinal perforation' (holes in the stomach, intestines or colon), a serious side effect requiring surgery to repair. This medicine should be started at least 28 days following major surgery and the site of the surgery should be totally healed. Check with your doctor before scheduling dental work or surgery while you are receiving this treatment. Talk to your doctor if you have recently had surgery or if you have a wound that has not healed. Do not become pregnant while taking this medicine or for 6 months after stopping it. Women should inform their doctor if they wish to become pregnant or think they might be pregnant. There is a potential for serious side effects to an unborn child. Talk to your health care professional or pharmacist for more information. Do not breast-feed an infant while taking this medicine. This medicine has caused ovarian failure in some women. This medicine may interfere with the ability to have a child. You should talk to your doctor or health care professional if you are concerned about your fertility. What side effects may I notice from receiving this medicine? Side effects that you should report to your doctor or health care professional as soon as possible: -allergic reactions like skin rash, itching or hives, swelling of the face,  lips, or tongue -signs of infection - fever or chills, cough, sore throat, pain or trouble passing urine -signs of decreased platelets or  bleeding - bruising, pinpoint red spots on the skin, black, tarry stools, nosebleeds, blood in the urine -breathing problems -changes in vision -chest pain -confusion -jaw pain, especially after dental work -mouth sores -seizures -severe abdominal pain -severe headache -sudden numbness or weakness of the face, arm or leg -swelling of legs or ankles -symptoms of a stroke: change in mental awareness, inability to talk or move one side of the body (especially in patients with lung cancer) -trouble passing urine or change in the amount of urine -trouble speaking or understanding -trouble walking, dizziness, loss of balance or coordination Side effects that usually do not require medical attention (report to your doctor or health care professional if they continue or are bothersome): -constipation -diarrhea -dry skin -headache -loss of appetite -nausea, vomiting This list may not describe all possible side effects. Call your doctor for medical advice about side effects. You may report side effects to FDA at 1-800-FDA-1088. Where should I keep my medicine? This drug is given in a hospital or clinic and will not be stored at home. NOTE: This sheet is a summary. It may not cover all possible information. If you have questions about this medicine, talk to your doctor, pharmacist, or health care provider.    2016, Elsevier/Gold Standard. (2014-06-28 16:58:44) Doxorubicin Liposomal injection What is this medicine? LIPOSOMAL DOXORUBICIN (LIP oh som al dox oh ROO bi sin) is a chemotherapy drug. This medicine is used to treat many kinds of cancer like Kaposi's sarcoma, multiple myeloma, and ovarian cancer. This medicine may be used for other purposes; ask your health care provider or pharmacist if you have questions. What should I tell my health care provider before I take this medicine? They need to know if you have any of these conditions: -blood disorders -heart disease -infection  (especially a virus infection such as chickenpox, cold sores, or herpes) -liver disease -recent or ongoing radiation therapy -an unusual or allergic reaction to doxorubicin, other chemotherapy agents, soybeans, other medicines, foods, dyes, or preservatives -pregnant or trying to get pregnant -breast-feeding How should I use this medicine? This drug is given as an infusion into a vein. It is administered in a hospital or clinic by a specially trained health care professional. If you have pain, swelling, burning or any unusual feeling around the site of your injection, tell your health care professional right away. Talk to your pediatrician regarding the use of this medicine in children. Special care may be needed. Overdosage: If you think you have taken too much of this medicine contact a poison control center or emergency room at once. NOTE: This medicine is only for you. Do not share this medicine with others. What if I miss a dose? It is important not to miss your dose. Call your doctor or health care professional if you are unable to keep an appointment. What may interact with this medicine? Do not take this medicine with any of the following medications: -zidovudine This medicine may also interact with the following medications: -medicines to increase blood counts like filgrastim, pegfilgrastim, sargramostim -vaccines Talk to your doctor or health care professional before taking any of these medicines: -acetaminophen -aspirin -ibuprofen -ketoprofen -naproxen This list may not describe all possible interactions. Give your health care provider a list of all the medicines, herbs, non-prescription drugs, or dietary supplements you use. Also tell them if you smoke, drink  alcohol, or use illegal drugs. Some items may interact with your medicine. What should I watch for while using this medicine? Your condition will be monitored carefully while you are receiving this medicine. You will need  important blood work done while you are taking this medicine. This drug may make you feel generally unwell. This is not uncommon, as chemotherapy can affect healthy cells as well as cancer cells. Report any side effects. Continue your course of treatment even though you feel ill unless your doctor tells you to stop. Your urine may turn orange-red for a few days after your dose. This is not blood. If your urine is dark or brown, call your doctor. In some cases, you may be given additional medicines to help with side effects. Follow all directions for their use. Call your doctor or health care professional for advice if you get a fever (100.5 degrees F or higher), chills or sore throat, or other symptoms of a cold or flu. Do not treat yourself. This drug decreases your body's ability to fight infections. Try to avoid being around people who are sick. This medicine may increase your risk to bruise or bleed. Call your doctor or health care professional if you notice any unusual bleeding. Be careful brushing and flossing your teeth or using a toothpick because you may get an infection or bleed more easily. If you have any dental work done, tell your dentist you are receiving this medicine. Avoid taking products that contain aspirin, acetaminophen, ibuprofen, naproxen, or ketoprofen unless instructed by your doctor. These medicines may hide a fever. Men and women of childbearing age should use effective birth control methods while using taking this medicine. Do not become pregnant while taking this medicine. There is a potential for serious side effects to an unborn child. Talk to your health care professional or pharmacist for more information. Do not breast-feed an infant while taking this medicine. Talk to your doctor about your risk of cancer. You may be more at risk for certain types of cancers if you take this medicine. What side effects may I notice from receiving this medicine? Side effects that you  should report to your doctor or health care professional as soon as possible: -allergic reactions like skin rash, itching or hives, swelling of the face, lips, or tongue -low blood counts - this medicine may decrease the number of white blood cells, red blood cells and platelets. You may be at increased risk for infections and bleeding. -signs of hand-foot syndrome - tingling or burning, redness, flaking, swelling, small blisters, or small sores on the palms of your hands or the soles of your feet -signs of infection - fever or chills, cough, sore throat, pain or difficulty passing urine -signs of decreased platelets or bleeding - bruising, pinpoint red spots on the skin, black, tarry stools, blood in the urine -signs of decreased red blood cells - unusually weak or tired, fainting spells, lightheadedness -back pain, chills, facial flushing, fever, headache, tightness in the chest or throat during the infusion -breathing problems -chest pain -fast, irregular heartbeat -mouth pain, redness, sores -pain, swelling, redness at site where injected -pain, tingling, numbness in the hands or feet -swelling of ankles, feet, or hands -vomiting Side effects that usually do not require medical attention (report to your doctor or health care professional if they continue or are bothersome): -diarrhea -hair loss -loss of appetite -nail discoloration or damage -nausea -red or watery eyes -red colored urine -stomach upset This list may not describe  all possible side effects. Call your doctor for medical advice about side effects. You may report side effects to FDA at 1-800-FDA-1088. Where should I keep my medicine? This drug is given in a hospital or clinic and will not be stored at home. NOTE: This sheet is a summary. It may not cover all possible information. If you have questions about this medicine, talk to your doctor, pharmacist, or health care provider.    2016, Elsevier/Gold Standard.  (2012-01-17 10:12:56)

## 2016-02-14 NOTE — Progress Notes (Signed)
Follow Up Note: Gyn-Onc  [name] 75 y.o. female  CC:  Chief Complaint  Patient presents with  . Endometrial cancer    Follow up    HPI:  [name] is seen for continuing attention to her recurrent endometrial carcinoma, post cycle 6 chemotherapy on 5/13. Cycle 5 was delayed a week due to counts, then taxol only given on 4-16 due to platelets of only 90K that day; she did have neulasta on 08-30-2011 (then enjoyed 2 weeks at beach). She then had radiation to persistent disease on the left pelvic side wall in 8/13.  History includes microinvasive T1N0 right breast cancer, which was ER/PR positive and HER-2 2+ with insufficient material for FISH at diagnosis in 03/1999, treated with lumpectomy with 2 sentinel node evaluation, local radiation and 5 years of Tamoxifen thru 05/2004, then aromatase inhibitor 07/2004 thru 04/2006.  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 She had 6 cycles of taxol/carboplatin through Jan 2010, and vaginal cuff brachytherapy. She did well until some vague LUQ symptoms in Nov. 2012, with CT AP in [facility] system Mar 22, 2011 showing left pelvic sidewall involvement. She was taken to laparoscopic evaluation 04-25-2011, with findings of dense fibrosis in the area as well as mass encasing obturator nerve and vein as well as internal iliac vasculature; left ureterolysis was performed. No pathology was submitted from that procedure. We proceeded with initial 3 additional cycles of taxol/ carboplatin (with carbo skin tests) from 05-16-2011 thru 06-28-2011, then repeated CT AP on 07-17-2011. The CT showed improvement in the left pelvic sidewall mass now 1.9 x 2 cm and 3.4 cm cephalocaudad, as compared with 2.5 x 2.2 cm and 5.4 cm on CT 03-22-11, and no progressive disease elsewhere. She saw me after the CT, with recommendation for an additonal 3  cycles of same chemotherapy then repeat scan. Cycle 4 was taxol + [place], with carbo skin test and neulasta, then cycle 5 as above only taxol.  She then completed radiation therapy under the care of Dr. Sondra Come which she completed in August of 2013. She had a CT scan on March 23, 2012 that revealed:   IMPRESSION:  1. Today's study demonstrates a positive response to therapy with decreased size of left pelvic sidewall nodal masses, as detailed above. No new soft tissue masses or new lymphadenopathy is identified within the abdomen or pelvis. Continue attention on follow-up studies is recommended.  2. Cholelithiasis without findings to suggest acute cholecystitis.  3. Colonic diverticulosis without findings to suggest acute diverticulitis at this time.  4. Mild asymmetric urinary bladder wall thickening is unchanged compared to prior examinations and is nonspecific. No definite bladder wall mass is identified at this time.  5. Additional findings, similar to prior examinations, as above.   There had been another lymph node identified on her CT scan in may that measured 2.1 x 1.7 cm. In reviewing her CT report it states that the nodal mass higher up along the pelvic sidewalls is no longer clearly identified although there continues to be some amorphus soft tissue in the region with the previously noted nodal mass resided. This was confirmed with the reading radiologist and communicated to the patient.   CT 06/22/12:  Normal urinary bladder. Hysterectomy. Decreased size of the left pelvic peripherally enhancing lesion. This measures 1.8 x 1.7 cm  on image 55/series 2 versus 2.4 x 2.1  cm on the prior exam. No new lesions are identified. No significant free fluid.  Bones/Musculoskeletal: No acute osseous abnormality. Trace L4-L5 anterolisthesis is likely degenerative. There is S-shaped lumbar  spine curvature.  IMPRESSION:  1. Further decreased size of the left pelvic peripherally enhancing lesion.  2.  No new sites of new or progressive disease.  3. Esophageal air fluid level suggests dysmotility or gastroesophageal reflux.  4. Cholelithiasis.   CT 11/02/2012:  The index lesion within the left pelvic side wall measures 2.2 x 2.7 cm, image 55/series 2. Previously this measured 1.8 x 1.7 cm. No new lesions identified. The stomach and small bowel loops appear within normal limits. Normal appearance of the proximal colon. Multiple sigmoid diverticula identified without acute inflammation. Review of the visualized bony structures shows scoliosis and multilevel degenerative disc disease within the lumbar spine.  IMPRESSION:  1. The left pelvic side wall lesion demonstrates mild increase in size from previous exam.  2. No new sites of new or progressive disease.  3. Cholelithiasis.   CT 01/28/13: FINDINGS:  Lung bases are clear. No focal hepatic lesion. The gallbladder, pancreas, spleen, adrenal glands, and kidneys are normal. There are several gallstones within the lumen of the gallbladder. The stomach, small bowel, and cecum are normal. The diverticulum sigmoid colon. Rectum is normal. Abdominal aorta is normal in caliber. No retroperitoneal or periportal lymphadenopathy. No evidence of peritoneal disease. Within the pelvis, the left pelvic sidewall lobular enhancing mass which is slightly increased in size. The lesion is similar in axial dimension measuring 30 x 21 mm compared to the 29 x 22 mm on prior remeasured. In the craniocaudad dimension, lesion measures 26 mm 1ncreased from 21 mm on prior. No additional pelvic lymphadenopathy. Hysterectomy anatomy. Vaginal cuff appears normal. Bladder is normal.  Review of bone windows demonstrates no aggressive osseous lesions.  IMPRESSION:  1. Interval small increase in volume of enhancing mass adjacent to the left pelvic sidewall.  2. No evidence of abdominal or pelvic lymphadenopathy. 75 year old with recurrent uterine papillary serous carcinoma initially  stage IB diagnosed in 2009. Most recent imaging in 01/2012 revealed  interval enlargement of her left pelvic sidewall mass consistent with nodal disease.   PET 03/10/13: 1. Hypermetabolic mass in the deep left pelvis consistent with metastasis.  2. No additional evidence of local metastasis. No evidence of distant metastasis.  3. Small right lower lobe pulmonary nodule is not hypermetabolic. Recommend attention on follow-up.  She underwent resection of pelvic mass, left sided ureterolysis, intra-operative radiation therapy by radiation oncology, omental J flap, and left ureteral stent placement by urology at [facility]  on 04/02/13.  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  Final pathology resulted:  Omentum, biopsy: Mature adipose tissue, fibrous tissue, calcifications and bland appearing gland consistent with endosalpingiosis.  No diagnostic carcinoma identified, pancytokeratin confirmatory (negative).  Lymph nodes, left obturator, regional resection: Aggregate of lymph node (4.1 cm) involved by poorly differentiated high grade carcinoma (see Light Microscopy).  Abundant tumor necrosis.   She had her cystoscopy and ureteral stent removal on 05/20/2013. She had a CT scan March 17 that revealed no evidence of recurrent disease.   She had a CT scan performed on 02/05/14: IMPRESSION:  No CT findings for recurrent or metastatic disease involving the abdomen/ pelvis.  MRI lumbar spine at [facility] on 06-13-2014 showed known scoliosis and degenerative disc disease, with new finding of 2.7 x 2.4 x 1.9 cm soft tissue mass at L3-4 involving left neural foramen, with focal cortical destruction of adjacent L3 vertebral body and portion of left L4 pedicle. She had CT biopsy on 06-21-14, with pathology ([id number]) poorly  differentiated carcinoma consistent with high grade serous. Bone scan 06-22-14 had uptake posteriorly in upper lumbar spine and a subtle increased area of uptake right greater trochanter of unclear significance. IMRT by Dr Sondra Come 2-29 thru 08-12-14. First zometa 07-07-14-6/16.  Post-XRT CT scan revealed: IMPRESSION: 1. Since the biopsy study of 06/21/2014, similar to slight decrease in size of a left paraspinous lesion at L3-4. 2. No new sites of metastatic disease identified. 3. 8 mm ground-glass nodule in the right lower lobe is grossly similar to 03/10/2013, suggesting a benign etiology. 4. Cholelithiasis. 5. Apparent sigmoid colonic wall thickening which could be due to underdistention. Colitis felt less likely. 6. Atherosclerosis, including within the coronary arteries. 7. Pelvic floor laxity.  CT 10/07/14: Musculoskeletal: Stable to slight interval decrease in size of left paraspinous lesion at the L3-4 level with peripheral enhancement measuring 1.6 x 1.7 cm (image 72; series 2), previously 2.0 x 1.8 cm.  IMPRESSION: Stable to slight interval decrease in size of left paraspinous lesion at L3-4.  No new sites of metastatic disease.  01/19/15: The left paraspinous lesion is again noted. Today this measures 11 mm, image 70/ series 2. Previously 1.7 cm. There are no new bone lesions identified.  IMPRESSION: 1. Decrease in size of left paraspinous metastasis. 2. No new sites of disease. 3. Stable ground-glass attenuating nodule within the superior segment of right lower lobe. 4. Gallstones.  PET 05/11/15: IMPRESSION: 1. The small soft tissue mass just lateral to the left L3 for neural foramen is hypermetabolic, favoring malignancy. 2. There is also a small focus of hypermetabolic activity between the left L1 and L2 transverse processes, but without a CT correlate. 3. The 13 mm sub solid nodule in the superior segment right lower lobe is stable from recent exams but has slowly  increased in size over the last 7 years. Although not hypermetabolic, the appearance is concerning for the possibility of a low grade adenocarcinoma.  She saw Dr. Roxan Hockey and cardiothoracic surgery in January. They recommended discuss the possibility of stereotactic radiation with Dr. Sondra Come versus follow-up. I have discussed the PET scan results with her. At that time as there had not been significant growth and there is no room to proceed with additional radiation she opted for short interval follow-up as her quality of life is good and she was completely asymptomatic. She comes in today in follow-up with a CT scan of the chest, abdomen and pelvis on 07/11/2015.  IMPRESSION: 1. 13 mm mixed solid and sub solid nodule in the posterior right lower lobe was present in 2009 and has clearly progressed in the interval since that study. Imaging features remain highly concerning for low-grade or well differentiated adenocarcinoma. 2. Slight increase size of in the hypermetabolic, rim enhancing nodule identified adjacent to the left L3-4 neural foramen. Metastatic disease remains a concern. 3. No evidence for discrete soft tissue lesion between the left L1 and 2 transverse processes, the site of focal FDG uptake on the recent PET-CT. 4. Cholelithiasis. 5. Abdominal aortic atherosclerosis.  She subsequently began dose dense carboplatin and paclitaxel on March 2017. She's undergone 3 cycles. She had to have growth factor support. She subsequent underwent a port  placement on May 3 secondary to poor IV access. After 3 cycles of chemotherapy she underwent a PET scan which revealed:  IMPRESSION: 1. Hypermetabolic left I3-3 paraspinous metastasis (biopsy-proven) is stable in size and slightly decreased in metabolism. 2. Previously described small focus of hypermetabolism between the left L2 and L3 spinous processes has resolved. 3. New mild linear hypermetabolism to the left of the T11-12 spinous processes  without discrete mass on the CT images, favor benign activity related activity, recommend attention on follow-up PET-CT. 4. No definite new sites of hypermetabolic metastatic disease. No recurrent hypermetabolic metastatic disease in the pelvis. 5. Interval stability of subsolid 1.3 cm superior segment right lower lobe pulmonary nodule without associated significant metabolism, which has grown compared to the 2009 chest CT study, and remain suspicious for low grade adenocarcinoma. 6. Additional findings include aortic atherosclerosis, coronary atherosclerosis, mild multinodular goiter with no hypermetabolic thyroid nodules, cholelithiasis and moderate sigmoid diverticulosis.  She did have PD-L1 testing and was negative. She then underwent 3 additional cycles of chemotherapy. She did not tolerate chemotherapy and it was stopped after cycle #6 day 8 secondary to fatigue and other toxicities. Should a PET scan on October 2 that revealed:  IMPRESSION 02/12/16: 1. Interval increase in size and metabolic activity of the LEFT paraspinal soft tissue metastasis. 2. New activity within the musculature of the LEFT chest wall. Given the unusual location of the paraspinal metastasis cannot exclude a second soft tissue metastasis to the musculature however favor benign physiologic activity. 3. Stable RIGHT lower lobe pulmonary nodule.  She comes in today with her husband and daughter to discuss these results. She's overall feeling fairly well. She is always very optimistic and positive. We discussed results By line and that is unclear with a new soft tissue mass might be but there is no distinct CT correlate. We discussed the slight decrease in size of the lower lobe right-sided pulmonary nodule. We also discussed the increased in size and increase in FDG avidity of the paraspinal mass. Greater than 30 minutes face to face time was spent with the patient, her husband and daughter. We discussed that she could have a  chemotherapy holiday at this time as treatment did not seem to make an appreciable difference in the size of the mass versus we can move additional options. If she wishes considered other options I would recommend proceeding with liposomal doxorubicin with or without bevacizumab. I discussed with her that she has a fairly uncommon tumor and it is behaved in a very indolent fashion as compared her to most uterine serous carcinomas. That being said, it is unclear how much she might have been taking a break from chemotherapy.  After discussion she would like to defer treatment until after thinks the holidays that she has plans. We'll move forward with a CT scan in the Monday after Thanksgiving.  Review of Systems  Constitutional: Feels well    Current Meds:  Outpatient Encounter Prescriptions as of 02/14/2016  Medication Sig  . ALPRAZolam (XANAX) 0.25 MG tablet Take 0.25 mg by mouth 2 (two) times daily as needed for anxiety (when flying). Reported on 11/13/2015  . amLODipine (NORVASC) 5 MG tablet Take 5 mg by mouth daily.  Marland Kitchen aspirin EC 81 MG tablet Take 81 mg by mouth daily.  Marland Kitchen atenolol (TENORMIN) 25 MG tablet Take 25 mg by mouth 2 (two) times daily.   . Biotin 5000 MCG TABS Take 5,000 mcg by mouth daily.  . cholecalciferol (VITAMIN D) 1000 UNITS tablet  Take 1,000 Units by mouth every Monday, Wednesday, and Friday.  . Cyanocobalamin (VITAMIN B-12) 5000 MCG SUBL Place 1 tablet under the tongue daily.   Marland Kitchen dexamethasone (DECADRON) 4 MG tablet Take 5 tablets with food 12 hrs prior to Taxol chemotherapy. (Patient not taking: Reported on 01/22/2016)  . ferrous fumarate (HEMOCYTE - 106 MG FE) 325 (106 Fe) MG TABS tablet Take 1 tablet (106 mg of iron total) by mouth daily. (Patient not taking: Reported on 01/08/2016)  . ibuprofen (ADVIL,MOTRIN) 200 MG tablet Take 200-400 mg by mouth 2 (two) times daily as needed. Reported on 11/13/2015  . lidocaine-prilocaine (EMLA) cream Apply to [facility] cath 1-2 hours prior to  access as directed  . Magnesium 250 MG TABS Take 250 mg by mouth every Monday, Wednesday, and Friday.   . Multiple Vitamin (MULTIVITAMIN WITH MINERALS) TABS tablet Take 1 tablet by mouth daily.  . Omega-3 Fatty Acids (FISH OIL PO) Take 360 mg by mouth daily.  . polyethylene glycol (MIRALAX / GLYCOLAX) packet Take 17 g by mouth daily as needed for mild constipation. Reported on 11/13/2015  . potassium gluconate 595 MG TABS tablet Take 595 mg by mouth every Monday, Wednesday, and Friday.  . simvastatin (ZOCOR) 20 MG tablet Take 20 mg by mouth daily.   No facility-administered encounter medications on file as of 02/14/2016.     Allergy:  Allergies  Allergen Reactions  . Ace Inhibitors Anaphylaxis  . Codeine Nausea Only  . Demerol Nausea Only  . Dilaudid [Hydromorphone Hcl] Other (See Comments)    "total loss of her mind"    Social Hx:   Social History   Social History  . Marital status: Married    Spouse name: N/A  . Number of children: N/A  . Years of education: N/A   Occupational History  . Not on file.   Social History Main Topics  . Smoking status: Former Smoker    Types: Cigarettes    Quit date: 05/13/1962  . Smokeless tobacco: Never Used  . Alcohol use Yes  . Drug use: No  . Sexual activity: No   Other Topics Concern  . Not on file   Social History Narrative  . No narrative on file    Past Surgical Hx:  Past Surgical History:  Procedure Laterality Date  . APPENDECTOMY    . BREAST LUMPECTOMY    . FOOT SURGERY     RIGHT  . TONSILLECTOMY    . TOTAL ABDOMINAL HYSTERECTOMY W/ BILATERAL SALPINGOOPHORECTOMY      Past Medical Hx:  Past Medical History:  Diagnosis Date  . Endometrial cancer ([place]) 12/2007   s/p total abdominal hysterectomy at [facility] - ovaries were also removed   . History of breast cancer 2000   right breast -- treated with lumpectomy and radiation therarpy, postoperatively with tamoxifen   . Hypercholesterolemia   . Hyperlipidemia   .  Hypertension   . Palpitations   . PVC's (premature ventricular contractions)   . Radiation 07/11/14-08/12/14   left lumbar paraspinal area 55 gray  . S/P radiation therapy 10/28/11-12/05/11   5040 cGy left pelvis  . S/P radiation therapy    Intracavitary brachytherapy of uterus    Family Hx:  Family History  Problem Relation Age of Onset  . Thrombosis Father     coronary thrombosis  . Hypertension Father   . Heart failure Mother   . Coronary artery disease Brother     Vitals:  There were no vitals taken for this  visit.  Physical Exam:  General: Well developed, well nourished female in no acute distress. Alert and oriented x 3.   Assessment/Plan:  75 year old with recurrent uterine papillary serous carcinoma initially stage IB diagnosed in 2009.  She is s/p resection of pelvic mass, left sided ureterolysis, intra-operative radiation therapy by radiation oncology, omental J flap, and left ureteral stent placement by [facility]rology at [facility]  on 04/02/13.  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   She then unfortunately suffered a recurrence in the left paraspinal space and was treated with Zometa as well as IMRT. Imaging shows that the lesion was initially smaller but slowly has grown in size. Also pulmonary nodule was identified that has been fairly slow growing. It has definitely grown since 2009. She has been seen by a thoracic surgery and by Dr. Sondra Come Dr. Sondra Come did not feel that there is any more room for radiation therapy for paraspinal mass.  She subsequently had undergone 3 cycles of dose dense carboplatin with a taxane. Her disease appeared stable with some potential decrease in the metabolic activity. She underwent an additional 3 cycles and had an increase in size of the mass and increasing FDG avidity. I  do not think that the carboplatin with weekly paclitaxel is benefiting her at this time. We discussed that she could take a chemotherapy holiday for the next 2 months and then proceed with repeat CT imaging to see if there's been a significant change in size. If the masses appear to be stable and she will continue follow-up. If the mass appeared to be increasing in size she would like to proceed with additional therapy. I discussed with her that I would most likely recommend liposomal doxorubicin either with or without bevacizumab. The rationale for adding bevacizumab to liposomal doxorubicin is in part based on the ovarian cancer data. However, as her serous carcinoma behave more like an ovarian cancer than endometrial cancer that would not be an unreasonable strategy based on the AURELIA data. We also discussed that we could proceed with single therapy and that both doxorubicin and bevacizumab have activity single agents in endometrial cancer. We discussed the most usual risks and benefits of these therapies including pancytopenia is, mucositis, PPE, and bowel perforations from the bevacizumab in addition to proteinuria and hypertension. There reveals information about this time she would like to take a chemotherapy holiday. She'll be scheduled for CT scan on Monday, November 27 and I will call her with the results.   Gicela Schwarting A.,  02/14/2016, 10:36 AM

## 2016-02-29 ENCOUNTER — Encounter: Payer: Self-pay | Admitting: Oncology

## 2016-03-04 ENCOUNTER — Ambulatory Visit (HOSPITAL_BASED_OUTPATIENT_CLINIC_OR_DEPARTMENT_OTHER): Payer: Medicare Other

## 2016-03-04 ENCOUNTER — Ambulatory Visit: Payer: Medicare Other | Admitting: Oncology

## 2016-03-04 DIAGNOSIS — C7951 Secondary malignant neoplasm of bone: Secondary | ICD-10-CM | POA: Diagnosis not present

## 2016-03-04 DIAGNOSIS — C541 Malignant neoplasm of endometrium: Secondary | ICD-10-CM

## 2016-03-04 DIAGNOSIS — Z95828 Presence of other vascular implants and grafts: Secondary | ICD-10-CM

## 2016-03-04 LAB — CBC WITH DIFFERENTIAL/PLATELET
BASO%: 1 % (ref 0.0–2.0)
Basophils Absolute: 0 10*3/uL (ref 0.0–0.1)
EOS%: 6.1 % (ref 0.0–7.0)
Eosinophils Absolute: 0.3 10*3/uL (ref 0.0–0.5)
HCT: 33.4 % — ABNORMAL LOW (ref 34.8–46.6)
HGB: 11.1 g/dL — ABNORMAL LOW (ref 11.6–15.9)
LYMPH%: 19.5 % (ref 14.0–49.7)
MCH: 34.3 pg — ABNORMAL HIGH (ref 25.1–34.0)
MCHC: 33.3 g/dL (ref 31.5–36.0)
MCV: 103.1 fL — ABNORMAL HIGH (ref 79.5–101.0)
MONO#: 0.7 10*3/uL (ref 0.1–0.9)
MONO%: 14.5 % — ABNORMAL HIGH (ref 0.0–14.0)
NEUT#: 2.9 10*3/uL (ref 1.5–6.5)
NEUT%: 58.9 % (ref 38.4–76.8)
Platelets: 174 10*3/uL (ref 145–400)
RBC: 3.24 10*6/uL — ABNORMAL LOW (ref 3.70–5.45)
RDW: 15 % — ABNORMAL HIGH (ref 11.2–14.5)
WBC: 4.9 10*3/uL (ref 3.9–10.3)
lymph#: 1 10*3/uL (ref 0.9–3.3)

## 2016-03-04 LAB — COMPREHENSIVE METABOLIC PANEL
ALT: 16 U/L (ref 0–55)
AST: 29 U/L (ref 5–34)
Albumin: 3.4 g/dL — ABNORMAL LOW (ref 3.5–5.0)
Alkaline Phosphatase: 45 U/L (ref 40–150)
Anion Gap: 9 mEq/L (ref 3–11)
BUN: 20.7 mg/dL (ref 7.0–26.0)
CO2: 24 mEq/L (ref 22–29)
Calcium: 9.3 mg/dL (ref 8.4–10.4)
Chloride: 110 mEq/L — ABNORMAL HIGH (ref 98–109)
Creatinine: 0.7 mg/dL (ref 0.6–1.1)
EGFR: 79 mL/min/{1.73_m2} — ABNORMAL LOW (ref >90)
Glucose: 101 mg/dl (ref 70–140)
Potassium: 4.1 mEq/L (ref 3.5–5.1)
Sodium: 143 mEq/L (ref 136–145)
Total Bilirubin: 0.71 mg/dL (ref 0.20–1.20)
Total Protein: 6.5 g/dL (ref 6.4–8.3)

## 2016-03-04 MED ORDER — SODIUM CHLORIDE 0.9 % IJ SOLN
10.0000 mL | INTRAMUSCULAR | Status: DC | PRN
  Administered 2016-03-04: 10 mL via INTRAVENOUS
  Filled 2016-03-04: qty 10

## 2016-03-04 MED ORDER — HEPARIN SOD (PORK) LOCK FLUSH 100 UNIT/ML IV SOLN
500.0000 [IU] | Freq: Once | INTRAVENOUS | Status: AC | PRN
  Administered 2016-03-04: 500 [IU] via INTRAVENOUS
  Filled 2016-03-04: qty 5

## 2016-03-06 ENCOUNTER — Encounter: Payer: Self-pay | Admitting: *Deleted

## 2016-03-06 NOTE — Progress Notes (Signed)
QI encounter 

## 2016-04-07 ENCOUNTER — Other Ambulatory Visit: Payer: Self-pay | Admitting: Oncology

## 2016-04-08 ENCOUNTER — Telehealth: Payer: Self-pay | Admitting: Gynecologic Oncology

## 2016-04-08 ENCOUNTER — Encounter: Payer: Self-pay | Admitting: Oncology

## 2016-04-08 ENCOUNTER — Ambulatory Visit (HOSPITAL_COMMUNITY)
Admission: RE | Admit: 2016-04-08 | Discharge: 2016-04-08 | Disposition: A | Discharge status: Home / Self Care | Payer: Medicare Other | Source: Ambulatory Visit | Attending: Gynecologic Oncology | Admitting: Gynecologic Oncology

## 2016-04-08 ENCOUNTER — Encounter (HOSPITAL_COMMUNITY): Payer: Self-pay

## 2016-04-08 DIAGNOSIS — I2510 Atherosclerotic heart disease of native coronary artery without angina pectoris: Secondary | ICD-10-CM | POA: Insufficient documentation

## 2016-04-08 DIAGNOSIS — R911 Solitary pulmonary nodule: Secondary | ICD-10-CM | POA: Insufficient documentation

## 2016-04-08 DIAGNOSIS — K8020 Calculus of gallbladder without cholecystitis without obstruction: Secondary | ICD-10-CM | POA: Insufficient documentation

## 2016-04-08 DIAGNOSIS — C541 Malignant neoplasm of endometrium: Secondary | ICD-10-CM

## 2016-04-08 DIAGNOSIS — R918 Other nonspecific abnormal finding of lung field: Secondary | ICD-10-CM | POA: Diagnosis not present

## 2016-04-08 DIAGNOSIS — C7951 Secondary malignant neoplasm of bone: Secondary | ICD-10-CM

## 2016-04-08 DIAGNOSIS — I700 Atherosclerosis of aorta: Secondary | ICD-10-CM | POA: Insufficient documentation

## 2016-04-08 MED ORDER — IOPAMIDOL (ISOVUE-300) INJECTION 61%
75.0000 mL | Freq: Once | INTRAVENOUS | Status: AC | PRN
  Administered 2016-04-08: 75 mL via INTRAVENOUS

## 2016-04-08 NOTE — Telephone Encounter (Signed)
Left message for patient with CT scan results.  Advised to call the office for any questions or concerns and that her results would be released in [place].  Per Dr. Alycia Rossetti, plan to repeat in 2-3 months.

## 2016-04-08 NOTE — Progress Notes (Signed)
Medical Oncology  MD spoke with [facility] pathology, requesting HER 2 testing on [id number] from 06-21-14, lumbar paraspinal mass.  Godfrey Pick, MD

## 2016-04-11 ENCOUNTER — Ambulatory Visit (HOSPITAL_BASED_OUTPATIENT_CLINIC_OR_DEPARTMENT_OTHER): Payer: Medicare Other | Admitting: Oncology

## 2016-04-11 ENCOUNTER — Other Ambulatory Visit (HOSPITAL_BASED_OUTPATIENT_CLINIC_OR_DEPARTMENT_OTHER): Payer: Medicare Other

## 2016-04-11 ENCOUNTER — Encounter: Payer: Self-pay | Admitting: Oncology

## 2016-04-11 ENCOUNTER — Ambulatory Visit (HOSPITAL_BASED_OUTPATIENT_CLINIC_OR_DEPARTMENT_OTHER): Payer: Medicare Other

## 2016-04-11 VITALS — BP 146/72 | HR 62 | Temp 98.2°F | Resp 18 | Ht 59.0 in | Wt 109.5 lb

## 2016-04-11 DIAGNOSIS — C541 Malignant neoplasm of endometrium: Secondary | ICD-10-CM

## 2016-04-11 DIAGNOSIS — G620 Drug-induced polyneuropathy: Secondary | ICD-10-CM

## 2016-04-11 DIAGNOSIS — C7951 Secondary malignant neoplasm of bone: Secondary | ICD-10-CM | POA: Diagnosis not present

## 2016-04-11 DIAGNOSIS — Z95828 Presence of other vascular implants and grafts: Secondary | ICD-10-CM

## 2016-04-11 DIAGNOSIS — Z853 Personal history of malignant neoplasm of breast: Secondary | ICD-10-CM | POA: Diagnosis not present

## 2016-04-11 DIAGNOSIS — C801 Malignant (primary) neoplasm, unspecified: Secondary | ICD-10-CM | POA: Diagnosis not present

## 2016-04-11 DIAGNOSIS — C549 Malignant neoplasm of corpus uteri, unspecified: Secondary | ICD-10-CM

## 2016-04-11 LAB — COMPREHENSIVE METABOLIC PANEL
ALT: 18 U/L (ref 0–55)
AST: 31 U/L (ref 5–34)
Albumin: 3.4 g/dL — ABNORMAL LOW (ref 3.5–5.0)
Alkaline Phosphatase: 46 U/L (ref 40–150)
Anion Gap: 8 mEq/L (ref 3–11)
BUN: 22.9 mg/dL (ref 7.0–26.0)
CO2: 27 mEq/L (ref 22–29)
Calcium: 9.7 mg/dL (ref 8.4–10.4)
Chloride: 107 mEq/L (ref 98–109)
Creatinine: 0.8 mg/dL (ref 0.6–1.1)
EGFR: 77 mL/min/{1.73_m2} — ABNORMAL LOW (ref >90)
Glucose: 86 mg/dl (ref 70–140)
Potassium: 4.6 mEq/L (ref 3.5–5.1)
Sodium: 142 mEq/L (ref 136–145)
Total Bilirubin: 0.5 mg/dL (ref 0.20–1.20)
Total Protein: 6.7 g/dL (ref 6.4–8.3)

## 2016-04-11 LAB — CBC WITH DIFFERENTIAL/PLATELET
BASO%: 0.4 % (ref 0.0–2.0)
Basophils Absolute: 0 10*3/uL (ref 0.0–0.1)
EOS%: 5.5 % (ref 0.0–7.0)
Eosinophils Absolute: 0.3 10*3/uL (ref 0.0–0.5)
HCT: 33.8 % — ABNORMAL LOW (ref 34.8–46.6)
HGB: 11.1 g/dL — ABNORMAL LOW (ref 11.6–15.9)
LYMPH%: 23.9 % (ref 14.0–49.7)
MCH: 32.6 pg (ref 25.1–34.0)
MCHC: 32.8 g/dL (ref 31.5–36.0)
MCV: 99.1 fL (ref 79.5–101.0)
MONO#: 0.7 10*3/uL (ref 0.1–0.9)
MONO%: 15.3 % — ABNORMAL HIGH (ref 0.0–14.0)
NEUT#: 2.6 10*3/uL (ref 1.5–6.5)
NEUT%: 54.9 % (ref 38.4–76.8)
Platelets: 175 10*3/uL (ref 145–400)
RBC: 3.41 10*6/uL — ABNORMAL LOW (ref 3.70–5.45)
RDW: 12.5 % (ref 11.2–14.5)
WBC: 4.8 10*3/uL (ref 3.9–10.3)
lymph#: 1.1 10*3/uL (ref 0.9–3.3)

## 2016-04-11 MED ORDER — HEPARIN SOD (PORK) LOCK FLUSH 100 UNIT/ML IV SOLN
500.0000 [IU] | Freq: Once | INTRAVENOUS | Status: AC | PRN
  Administered 2016-04-11: 500 [IU] via INTRAVENOUS
  Filled 2016-04-11: qty 5

## 2016-04-11 MED ORDER — SODIUM CHLORIDE 0.9 % IJ SOLN
10.0000 mL | INTRAMUSCULAR | Status: DC | PRN
  Administered 2016-04-11: 10 mL via INTRAVENOUS
  Filled 2016-04-11: qty 10

## 2016-04-11 NOTE — Progress Notes (Signed)
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 Last systemic treatment was  6 cycles of dose dense carbo taxol from [id number] thru 01-16-15. She had PET 02-12-16 and CT AP 04-08-16, discussed today.  The PET on 02-12-16 had some increase in uptake in paraspinal area without CT correlate. She saw Dr Alycia Rossetti on 02-14-16, with discussion of treatment break with close follow up vs doxil vs doxil + avastin. Follow up CT AP on 04-08-16, compared with PET 02-12-16 and CT 06-2015 has unchanged paraspinal soft tissue and no new findings of concern.  Patient is feeling entirely well. She has no new or different symptoms with back, where she also has chronic severe scoliosis and some symptoms related to that. She has been working out at gym again regularly, walking 2 miles on treatmill at slightly slower pace of 3.8 miles/ hr rather than 4.0 as prior to recent chemo. She enjoyed recent trip to beach, able to walk on the beach without difficulty. Energy is good, appetite improved, no problems with bowels, no SOB or other respiratory symptoms, no chest discomfort, no LE swelling, no bleeding, no fever or symptoms of infection, no problems with PAC. Remainder of 10 point Review of Systems negative.   PAC placed by IR on 09-13-15, flushed 04-11-16 Biopsy of recurrence 06-2014 ER negative. No genetics testing  PDL1 testing low at 2% HER 2 testing requested on [id number], pending.  ONCOLOGIC HISTORY BREAST CANCER: microinvasive T1N0 right breast cancer which was ER/PR positive and HER-2 2+ with insufficient material for FISH at diagnosis in 03/1999, treated with lumpectomy with 2 sentinel node evaluation, local radiation and 5 years of Tamoxifen thru 05/2004, then aromatase inhibitor 07/2004  thru 04/2006.   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 She had 6 cycles of taxol/carboplatin through Jan 2010, and vaginal cuff brachytherapy. She did well until some vague LUQ symptoms in Nov. 2012, with CT AP in [facility] system Mar 22, 2011 showing left pelvic sidewall involvement. She had laparoscopic evaluation at [facility] by [place] 04-25-2011, with finding of dense fibrosis in the area as well as mass encasing obturator nerve and vein as well as internal iliac vasculature; left ureterolysis was performed. No pathology was submitted from that procedure. She received 3 additional cycles of taxol/ carboplatin from 05-16-2011 thru 06-28-2011. CT AP on 07-17-2011 showed improvement in the left pelvic sidewall mass now 1.9 x 2 cm and 3.4 cm cephalocaudad, as compared with 2.5 x 2.2 cm and 5.4 cm on CT 03-22-11, and no progressive disease elsewhere. She saw Dr.Gehrig after the CT, recommendation for an additonal 3 cycles of same chemotherapy . Cycle 4 was taxol + carbo and neulasta, cycle 5 taxol only due to counts and cycle 6 taxol + carboplatin on 09-17-11. Follow up CT in May 2013 showed enlargement of the left pelvic sidewall involvement. She had IMRT 50 GY in 28 fractions by Dr Sondra Come, that completed 12-05-11. She had regular follow up scans, Including CT AP 01-2014 and PET 02-2013, which did not show progressive disease. She has scoliosis and some chronic intermittent low back discomfort, but developed different, persistent discomfort low back and radiating to LLE by ~ Dec into Jan 2016. MRI lumbar  spine at [facility] on 06-13-2014 showed known scoliosis and degenerative disc disease, with new finding of 2.7 x 2.4 x 1.9 cm soft tissue mass at L3-4 involving left neural foramen, with focal cortical destruction of adjacent L3 vertebral body and portion of left L4 pedicle.  She had CT biopsy on 06-21-14, with pathology ([id number]) poorly differentiated carcinoma consistent with high grade serous. Bone scan 06-22-14 had uptake posteriorly in upper lumbar spine and a subtle increased area of uptake right greater trochanter of unclear significance. IMRT by Dr Sondra Come 2-29 thru 08-12-14. Zometa x4 07-07-14 thru 10-2014. Progression at L1-2 and possible RLL lung nodule found on PET 04-2015 and CT CAP 06-2015. She began dose dense carbo taxol on 08-03-15. She was neutropenic by day 15 cycle 1, ANC 0.5 and granix added. PET 11-06-15,after 3 cycles doses dense carbo taxol , had slight improvement in hypermetabolic uptake at B0-9, no change size/ no hypermetabolism 1.3 cm RLL pulmonary nodule, resolution of L2-3 area and no correlate to slight hypermetabolism at T11-12. Dose dense carbo taxol continued thru day 8 cycle 6 on 01-16-15, by which point she was not tolerating as well overall. Restaging PET 02-12-16 had some increase in paraspinous uptake without CT correlate. PDL-1 testing from 06-2014 pathology done 10-2015 had low expression of PDL-1, at only 2%. CT AP 04-09-16 demonstrated no change in soft tissue in paraspinous area and no other new or different findings.  .  Objective:  Vital signs in last 24 hours:  BP (!) 146/72 (BP Location: Left Arm, Patient Position: Sitting)   Pulse 62   Temp 98.2 F (36.8 C) (Oral)   Resp 18   Ht '4\' 11"'$  (1.499 m)   Wt 109 lb 8 oz (49.7 kg)   SpO2 99%   BMI 22.12 kg/m   Alert, oriented and appropriate. Ambulatory without assistance difficulty.  Alopecia  HEENT:PERRL, sclerae not icteric. Oral mucosa moist without lesions, posterior pharynx clear.  Neck supple. No JVD.  Lymphatics:no cervical,suraclavicular, axillary or inguinal adenopathy Resp: clear to auscultation bilaterally and normal percussion bilaterally Cardio: regular rate and rhythm. No gallop. GI: soft, nontender, not distended, no mass or organomegaly. Normally active bowel  sounds Musculoskeletal/ Extremities: UE/ LE without pitting edema, cords, tenderness. Marked scoliosis as previously Neuro: no peripheral neuropathy. Otherwise nonfocal Skin without rash, ecchymosis, petechiae Breasts:right breast with well healed surgical scar without dominant mass, skin or nipple findings. Left breast no findings of concern.  Axillae benign. Portacath-without erythema or tenderness  Lab Results:  Results for orders placed or performed in visit on 04/11/16  CBC with Differential  Result Value Ref Range   WBC 4.8 3.9 - 10.3 10e3/uL   NEUT# 2.6 1.5 - 6.5 10e3/uL   HGB 11.1 (L) 11.6 - 15.9 g/dL   HCT 33.8 (L) 34.8 - 46.6 %   Platelets 175 145 - 400 10e3/uL   MCV 99.1 79.5 - 101.0 fL   MCH 32.6 25.1 - 34.0 pg   MCHC 32.8 31.5 - 36.0 g/dL   RBC 3.41 (L) 3.70 - 5.45 10e6/uL   RDW 12.5 11.2 - 14.5 %   lymph# 1.1 0.9 - 3.3 10e3/uL   MONO# 0.7 0.1 - 0.9 10e3/uL   Eosinophils Absolute 0.3 0.0 - 0.5 10e3/uL   Basophils Absolute 0.0 0.0 - 0.1 10e3/uL   NEUT% 54.9 38.4 - 76.8 %   LYMPH% 23.9 14.0 - 49.7 %   MONO% 15.3 (H) 0.0 - 14.0 %   EOS% 5.5 0.0 - 7.0 %  BASO% 0.4 0.0 - 2.0 %  Comprehensive metabolic panel  Result Value Ref Range   Sodium 142 136 - 145 mEq/L   Potassium 4.6 3.5 - 5.1 mEq/L   Chloride 107 98 - 109 mEq/L   CO2 27 22 - 29 mEq/L   Glucose 86 70 - 140 mg/dl   BUN 22.9 7.0 - 26.0 mg/dL   Creatinine 0.8 0.6 - 1.1 mg/dL   Total Bilirubin 0.50 0.20 - 1.20 mg/dL   Alkaline Phosphatase 46 40 - 150 U/L   AST 31 5 - 34 U/L   ALT 18 0 - 55 U/L   Total Protein 6.7 6.4 - 8.3 g/dL   Albumin 3.4 (L) 3.5 - 5.0 g/dL   Calcium 9.7 8.4 - 10.4 mg/dL   Anion Gap 8 3 - 11 mEq/L   EGFR 77 (L) >90 ml/min/1.73 m2     Studies/Results: EXAM: CT CHEST, ABDOMEN, AND PELVIS WITH CONTRAST  04-08-16  COMPARISON:  PET of 02/12/2016.  Most recent CTs of 07/11/2015.  FINDINGS: CT CHEST FINDINGS  Cardiovascular: A left Port-A-Cath which terminates at the mid to low  SVC. Aortic and branch vessel atherosclerosis. Tortuous thoracic aorta. Cardiomegaly, accentuated by a pectus excavatum deformity. Multivessel coronary artery atherosclerosis. No central pulmonary embolism, on this non-dedicated study.  Mediastinum/Nodes: No supraclavicular adenopathy. Hypo attenuating bilateral thyroid nodules are nonspecific. No mediastinal or hilar adenopathy.  Lungs/Pleura: No pleural fluid. Superior segment right lower lobe sub solid pulmonary nodule again identified. Measures on the order of 10 x 6 mm on image 56/ series 4, similar.  Musculoskeletal: No acute osseous abnormality. No left chest wall muscular soft tissue mass to correspond to the questioned PET abnormality.  CT ABDOMEN PELVIS FINDINGS  Hepatobiliary: Normal liver. Multiple gallstones on the order of 6 mm. No acute cholecystitis or biliary duct dilatation.  Pancreas: Mild pancreatic atrophy, without duct dilatation or acute inflammation.  Spleen: Normal in size, without focal abnormality.  Adrenals/Urinary Tract: Normal adrenal glands. Normal kidneys, without hydronephrosis. Normal urinary bladder.  Stomach/Bowel: Proximal gastric underdistention. Scattered colonic diverticula. Normal terminal ileum. Normal small bowel.  Vascular/Lymphatic: Advanced aortic and branch vessel atherosclerosis. No abdominopelvic adenopathy.  Reproductive: Hysterectomy.  No adnexal mass.  Other: No significant free fluid. Mild pelvic floor laxity. No evidence of omental or peritoneal disease.  Musculoskeletal: Left paravertebral soft tissue density lesion measures 1.9 x 1.9 cm on image 65/series 2. This is unchanged. Lumbosacral spondylosis. Grade 1 L4-5 anterolisthesis. S-shaped thoracolumbar spine curvature.  IMPRESSION: 1. Similar size of a  left paravertebral abdominal lesion. 2. No new or progressive metastatic disease. 3. No correlate for the muscular activity about the lateral  left chest wall. 4.  Coronary artery atherosclerosis. Aortic atherosclerosis. 5. Cholelithiasis. 6. Similar right lower lobe pulmonary nodule.     EXAM: NUCLEAR MEDICINE PET SKULL BASE TO THIGH 02-12-16  COMPARISON:  None.  FINDINGS: NECK  No hypermetabolic lymph nodes in the neck.  CHEST  No hypermetabolic mediastinal lymph nodes. Within the RIGHT lower lobe 10 mm x 7 mm nodule is similar to comparison exam at 13 mm x 10 mm. No significant metabolic activity.  There is metabolic activity associated with the LEFT chest wall musculature deep to the scapular tip and some muscle activity within the supraspinatus muscle. There is relatively intense with SUV max equals 7.3 (image 75 of fused data set). All similar chest wall muscle activity on the RIGHT.  ABDOMEN/PELVIS  No evidence of local endometrial carcinoma recurrence in the pelvis. Physiologic activity  noted in the bowel.  Paraspinal L3-L4 soft tissue nodule on the LEFT is again demonstrated with increased metabolic activity with SUV max equal 8.9 decreased from 6.8. The lesion measures 1.9 cm (image 11, series 4) compared to 1.7 cm on prior.  No abnormal metabolic activity in the liver. No hypermetabolic abdominal pelvic lymph nodes  Post hysterectomy anatomy  SKELETON  No focal hypermetabolic activity to suggest skeletal metastasis.  IMPRESSION: 1. Interval increase in size and metabolic activity of the LEFT paraspinal soft tissue metastasis. 2. New activity within the musculature of the LEFT chest wall. Given the unusual location of the paraspinal metastasis cannot exclude a second soft tissue metastasis to the musculature however favor benign physiologic activity. 3. Stable RIGHT lower lobe pulmonary nodule. 4. No evidence of local recurrence.  PACs images reviewed with patient and family at visit.  Medications: I have reviewed the patient's current medications.  DISCUSSION Interval  history reviewed. We are delighted that she is feeling so well and that the CT appears stable. I have told them that additional chemo now would not change prognosis and that it is very reasonable to watch closely on treatment break, with treatment to be resumed when we can tell progression. We have decided to continue on break at present and will repeat CT late Jan with return visit to this MD then. Patient and husband are away in late Feb when Dr Alycia Rossetti is in [place]. We will let her know HER 2 information when available. Patient knows to call prior to scheduled appointment if any concerns, but otherwise will continue to exercise and otherwise recuperate from recent chemo.  PDL1 testing on prior path low at only 2%, such that pembrolizumab not likely useful HER 2 testing requested. The breast cancer was Her 2  2+, which was considered negative by that testing in 2000.    Assessment/Plan:  1.Papillary serous endometrial carcinoma IB at diagnosis Aug 2009, recurrent at left pelvic sidewall 03-2011 and progression in L1-2 in .06-2014. Additional carbo taxol x 6 thru 01-16-15. Clinically feeling very well and CT stable. She is glad to continue on close observation thru holidays. HER 2 testing pending. PDL-1 only 2%, not candidate for pembrolizumab 2.T1N0 right breast cancer 03-1999: adjuvant therapy included 5 years of tamoxifen. Not recurrent. [facility]18[facility], tomo due to extremely dense breast tissue. By other recent imaging, nothing of concern in breasts. No findings on recent PET, but is overdue mammograms. 3.scoliosis of lumbar spine with degenerative disc disease and chronic symptoms related to this, stable 4.HTN followed by Dr Virgina Jock. She had difficulty with prior chemo with lower BP and tachycardia also, and hypotension. Holds atenolol and amlodipine prn, pushes fluids.  5. Chemo neutropenia/ chemo leukopenia: related to total amount of chemo and radiation, supported with  granix, now recovered  6.mild anemia multifactorial including chemo anemia. Hgb improved and now stable on chemo break, iron not markedly low on 10-19-15. Follow 7.Marland KitchenPAC placed by IR on 09-13-15, continue to flush every 6-8 weeks when not otherwise used. 8. Chemo peripheral neuropathy feet> hands, slight increase with recent taxol. She understands that this may improve or hopefully resolve, tho at times symptoms from the taxol do not resolve entirely. She does not want medication for symptoms now. 9.possible lactose intolerance: abdominal bloating and gaseousness initiallyimproved off of milk products, but resumed later without clear worsening. 10.plantar fasciitis: improved  11. Severe scoliosis with chronic symptoms related  Time spent 35 min including >50% counseling and coordination of  care. Route PCP, cc Dr Alycia Rossetti with the CT information.  ADDENDUM: available after visit, HER 2 testing negative: AMIRIA, ORRISON B Collected: 06/21/2014 Client: [facility] Accession: [id number] Received: 06/21/2014 [facility] ADDITIONAL INFORMATION: FLUORESCENCE IN-SITU HYBRIDIZATION Results: HER2 - NEGATIVE RATIO OF HER2/CEP17 SIGNALS 1.72 AVERAGE HER2 COPY NUMBER PER CELL 3.95 Reference Range: NEGATIVE HER2/CEP17 Ratio <2.0 and average HER2 copy number <4.0 EQUIVOCAL HER2/CEP17 Ratio <2.0 and average HER2 copy number >=4.0 and <6.0 POSITIVE HER2/CEP17 Ratio >=2.0 or <2.0 and average HER2 copy number >=6.0   Evlyn Clines, MD   04/11/2016, 12:48 PM

## 2016-04-12 DIAGNOSIS — I10 Essential (primary) hypertension: Secondary | ICD-10-CM | POA: Diagnosis not present

## 2016-04-12 DIAGNOSIS — I700 Atherosclerosis of aorta: Secondary | ICD-10-CM | POA: Diagnosis not present

## 2016-04-12 DIAGNOSIS — Z1389 Encounter for screening for other disorder: Secondary | ICD-10-CM | POA: Diagnosis not present

## 2016-04-12 DIAGNOSIS — C50919 Malignant neoplasm of unspecified site of unspecified female breast: Secondary | ICD-10-CM | POA: Diagnosis not present

## 2016-04-12 DIAGNOSIS — I77810 Thoracic aortic ectasia: Secondary | ICD-10-CM | POA: Diagnosis not present

## 2016-04-12 DIAGNOSIS — D72819 Decreased white blood cell count, unspecified: Secondary | ICD-10-CM | POA: Diagnosis not present

## 2016-04-12 DIAGNOSIS — I2510 Atherosclerotic heart disease of native coronary artery without angina pectoris: Secondary | ICD-10-CM | POA: Diagnosis not present

## 2016-04-12 DIAGNOSIS — G608 Other hereditary and idiopathic neuropathies: Secondary | ICD-10-CM | POA: Diagnosis not present

## 2016-04-12 DIAGNOSIS — D698 Other specified hemorrhagic conditions: Secondary | ICD-10-CM | POA: Diagnosis not present

## 2016-04-12 DIAGNOSIS — Z0000 Encounter for general adult medical examination without abnormal findings: Secondary | ICD-10-CM | POA: Diagnosis not present

## 2016-04-12 DIAGNOSIS — E784 Other hyperlipidemia: Secondary | ICD-10-CM | POA: Diagnosis not present

## 2016-04-12 DIAGNOSIS — C7951 Secondary malignant neoplasm of bone: Secondary | ICD-10-CM | POA: Diagnosis not present

## 2016-04-14 ENCOUNTER — Other Ambulatory Visit: Payer: Self-pay | Admitting: Oncology

## 2016-04-15 ENCOUNTER — Telehealth: Payer: Self-pay

## 2016-04-15 NOTE — Telephone Encounter (Signed)
lvm per Dr Mariana Kaufman attached message.

## 2016-04-15 NOTE — Telephone Encounter (Signed)
-----   Message from Gordy Levan, MD sent at 04/14/2016  4:59 PM EST ----- Please let patient know that HER 2 testing on the previous pathology was negative.  I would like for her to get mammograms before I see her again in Jan, last 10-2014 -she did have other scans in meantime, but mammograms still probably a good idea.  Order is in EMR  thanks

## 2016-05-20 ENCOUNTER — Other Ambulatory Visit: Payer: Self-pay

## 2016-05-20 DIAGNOSIS — C549 Malignant neoplasm of corpus uteri, unspecified: Secondary | ICD-10-CM

## 2016-05-20 MED ORDER — NAPROXEN 500 MG PO TABS: 500.0000 mg | ORAL_TABLET | Freq: Every day | ORAL | 1 refills | Status: DC | PRN

## 2016-05-24 ENCOUNTER — Ambulatory Visit: Payer: Medicare Other

## 2016-05-30 ENCOUNTER — Other Ambulatory Visit: Payer: Self-pay | Admitting: Internal Medicine

## 2016-05-30 DIAGNOSIS — Z1231 Encounter for screening mammogram for malignant neoplasm of breast: Secondary | ICD-10-CM

## 2016-05-31 ENCOUNTER — Telehealth: Payer: Self-pay | Admitting: Oncology

## 2016-05-31 NOTE — Telephone Encounter (Signed)
sw pt to confirm r/s lab/flush appt to 1/22 at 9 am per lOS

## 2016-06-01 ENCOUNTER — Other Ambulatory Visit: Payer: Self-pay | Admitting: Oncology

## 2016-06-01 DIAGNOSIS — C549 Malignant neoplasm of corpus uteri, unspecified: Secondary | ICD-10-CM

## 2016-06-03 ENCOUNTER — Ambulatory Visit (HOSPITAL_COMMUNITY)
Admission: RE | Admit: 2016-06-03 | Discharge: 2016-06-03 | Disposition: A | Discharge status: Home / Self Care | Payer: Medicare Other | Source: Ambulatory Visit | Attending: Oncology | Admitting: Oncology

## 2016-06-03 ENCOUNTER — Encounter (HOSPITAL_COMMUNITY): Payer: Self-pay

## 2016-06-03 ENCOUNTER — Other Ambulatory Visit (HOSPITAL_BASED_OUTPATIENT_CLINIC_OR_DEPARTMENT_OTHER): Payer: Medicare Other

## 2016-06-03 ENCOUNTER — Ambulatory Visit (HOSPITAL_BASED_OUTPATIENT_CLINIC_OR_DEPARTMENT_OTHER): Payer: Medicare Other

## 2016-06-03 DIAGNOSIS — C541 Malignant neoplasm of endometrium: Secondary | ICD-10-CM

## 2016-06-03 DIAGNOSIS — C7951 Secondary malignant neoplasm of bone: Secondary | ICD-10-CM

## 2016-06-03 DIAGNOSIS — C549 Malignant neoplasm of corpus uteri, unspecified: Secondary | ICD-10-CM | POA: Diagnosis not present

## 2016-06-03 DIAGNOSIS — K8020 Calculus of gallbladder without cholecystitis without obstruction: Secondary | ICD-10-CM | POA: Insufficient documentation

## 2016-06-03 DIAGNOSIS — I700 Atherosclerosis of aorta: Secondary | ICD-10-CM | POA: Insufficient documentation

## 2016-06-03 DIAGNOSIS — Z90710 Acquired absence of both cervix and uterus: Secondary | ICD-10-CM | POA: Diagnosis not present

## 2016-06-03 DIAGNOSIS — Z95828 Presence of other vascular implants and grafts: Secondary | ICD-10-CM

## 2016-06-03 LAB — COMPREHENSIVE METABOLIC PANEL
ALT: 16 U/L (ref 0–55)
AST: 25 U/L (ref 5–34)
Albumin: 3.5 g/dL (ref 3.5–5.0)
Alkaline Phosphatase: 46 U/L (ref 40–150)
Anion Gap: 9 mEq/L (ref 3–11)
BUN: 19.3 mg/dL (ref 7.0–26.0)
CO2: 27 mEq/L (ref 22–29)
Calcium: 9.4 mg/dL (ref 8.4–10.4)
Chloride: 106 mEq/L (ref 98–109)
Creatinine: 0.7 mg/dL (ref 0.6–1.1)
EGFR: 80 mL/min/{1.73_m2} — ABNORMAL LOW (ref >90)
Glucose: 87 mg/dl (ref 70–140)
Potassium: 4 mEq/L (ref 3.5–5.1)
Sodium: 141 mEq/L (ref 136–145)
Total Bilirubin: 0.88 mg/dL (ref 0.20–1.20)
Total Protein: 6.7 g/dL (ref 6.4–8.3)

## 2016-06-03 LAB — CBC WITH DIFFERENTIAL/PLATELET
BASO%: 0.8 % (ref 0.0–2.0)
Basophils Absolute: 0 10*3/uL (ref 0.0–0.1)
EOS%: 4.4 % (ref 0.0–7.0)
Eosinophils Absolute: 0.2 10*3/uL (ref 0.0–0.5)
HCT: 34.7 % — ABNORMAL LOW (ref 34.8–46.6)
HGB: 11.5 g/dL — ABNORMAL LOW (ref 11.6–15.9)
LYMPH%: 20.9 % (ref 14.0–49.7)
MCH: 31 pg (ref 25.1–34.0)
MCHC: 33.2 g/dL (ref 31.5–36.0)
MCV: 93.4 fL (ref 79.5–101.0)
MONO#: 0.8 10*3/uL (ref 0.1–0.9)
MONO%: 15.4 % — ABNORMAL HIGH (ref 0.0–14.0)
NEUT#: 3 10*3/uL (ref 1.5–6.5)
NEUT%: 58.5 % (ref 38.4–76.8)
Platelets: 207 10*3/uL (ref 145–400)
RBC: 3.72 10*6/uL (ref 3.70–5.45)
RDW: 14 % (ref 11.2–14.5)
WBC: 5.2 10*3/uL (ref 3.9–10.3)
lymph#: 1.1 10*3/uL (ref 0.9–3.3)

## 2016-06-03 MED ORDER — SODIUM CHLORIDE 0.9 % IJ SOLN
10.0000 mL | INTRAMUSCULAR | Status: DC | PRN
  Administered 2016-06-03: 10 mL via INTRAVENOUS
  Filled 2016-06-03: qty 10

## 2016-06-03 MED ORDER — HEPARIN SOD (PORK) LOCK FLUSH 100 UNIT/ML IV SOLN
INTRAVENOUS | Status: AC
  Filled 2016-06-03: qty 5

## 2016-06-03 MED ORDER — IOPAMIDOL (ISOVUE-300) INJECTION 61%
100.0000 mL | Freq: Once | INTRAVENOUS | Status: AC | PRN
  Administered 2016-06-03: 100 mL via INTRAVENOUS

## 2016-06-03 MED ORDER — IOPAMIDOL (ISOVUE-300) INJECTION 61%
INTRAVENOUS | Status: AC
  Filled 2016-06-03: qty 100

## 2016-06-06 ENCOUNTER — Other Ambulatory Visit: Payer: Medicare Other

## 2016-06-06 ENCOUNTER — Ambulatory Visit (HOSPITAL_BASED_OUTPATIENT_CLINIC_OR_DEPARTMENT_OTHER): Payer: Medicare Other | Admitting: Oncology

## 2016-06-06 VITALS — BP 115/80 | HR 69 | Temp 98.1°F | Resp 18 | Wt 112.2 lb

## 2016-06-06 DIAGNOSIS — Z853 Personal history of malignant neoplasm of breast: Secondary | ICD-10-CM

## 2016-06-06 DIAGNOSIS — G8929 Other chronic pain: Secondary | ICD-10-CM | POA: Diagnosis not present

## 2016-06-06 DIAGNOSIS — C541 Malignant neoplasm of endometrium: Secondary | ICD-10-CM | POA: Diagnosis not present

## 2016-06-06 DIAGNOSIS — R922 Inconclusive mammogram: Secondary | ICD-10-CM

## 2016-06-06 DIAGNOSIS — I700 Atherosclerosis of aorta: Secondary | ICD-10-CM

## 2016-06-06 DIAGNOSIS — G620 Drug-induced polyneuropathy: Secondary | ICD-10-CM

## 2016-06-06 DIAGNOSIS — Z95828 Presence of other vascular implants and grafts: Secondary | ICD-10-CM

## 2016-06-06 DIAGNOSIS — C7951 Secondary malignant neoplasm of bone: Secondary | ICD-10-CM

## 2016-06-06 NOTE — Progress Notes (Signed)
OFFICE PROGRESS NOTE   June 06, 2016   Physicians: P.Gehrig, J.Kinard, (T.Brackbill) J.Russo, S.Ganem, [facility]  INTERVAL HISTORY:  Patient is seen, together with daughter and husband, in follow up of metastatic endometrial carcinoma to L3-4, on observation since last carbo taxol 01-2015, and previously treated with radiation and IV bisphosphonate. She had follow up CT AP 06-03-16, which appears stable. She saw Dr Alycia Rossetti 02-2016 and will see her again in 08-2016.  She has remote history of right breast cancer treated with tamoxifen.  Patient had respiratory infection recently, cough almost resolved now after 3 weeks. She had no fever and denies chest pain or SOB. Cough has mostly been NP. She felt well until the respiratory infection and is feeling better again now, tho has not resumed usual exercising. She has had no increased or different back discomfort than usual chronic mild symptoms from scoliosis. She has no other different bone pain. Appetite is at baseline, no abdominal or pelvic discomfort. Dr Virgina Jock has ordered mammograms. No LE swelling. No bleeding. No problems with PAC, flushed without difficulty today. Remainder of 14 point Review of Systems negative.   PAC placed by IR on 09-13-15, flushed 06-06-16.  Biopsy of recurrence 06-2014 ER negative. No genetics testing  PDL1 testing low at 2% HER 2 negative on pathology [id number] Flu vaccine 01-17-16  ONCOLOGIC HISTORY BREAST CANCER: microinvasive T1N0 right breast cancer which was ER/PR positive and HER-2 2+ with insufficient material for FISH at diagnosis in 03/1999, treated with lumpectomy with 2 sentinel node evaluation, local radiation and 5 years of Tamoxifen thru 05/2004, then aromatase inhibitor 07/2004 thru 04/2006. Extremely dense breast tissue.  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 She had 6 cycles of taxol/carboplatin through Jan 2010, and vaginal cuff brachytherapy. She did well until some vague LUQ symptoms in Nov. 2012, with CT AP in [facility] system Mar 22, 2011 showing left pelvic sidewall involvement. She had laparoscopic evaluation at [facility] by [place] 04-25-2011, with finding of dense fibrosis in the area as well as mass encasing obturator nerve and vein as well as internal iliac vasculature; left ureterolysis was performed. No pathology was submitted from that procedure. She received 3 additional cycles of taxol/ carboplatin from 05-16-2011 thru 06-28-2011. CT AP on 07-17-2011 showed improvement in the left pelvic sidewall mass now 1.9 x 2 cm and 3.4 cm cephalocaudad, as compared with 2.5 x 2.2 cm and 5.4 cm on CT 03-22-11, and no progressive disease elsewhere. She saw Dr.Gehrig after the CT, recommendation for an additonal 3 cycles of same chemotherapy . Cycle 4 was taxol + carbo and neulasta, cycle 5 taxol only due to counts and cycle 6 taxol + carboplatin on 09-17-11. CT in May 2013 showed enlargement of the left pelvic sidewall involvement. She had IMRT 50 GY in 28 fractions by Dr Sondra Come, that completed 12-05-11.  CT AP 01-2014 and PET 02-2013 did not show progressive disease. She developed different, persistent discomfort low back and radiating to LLE by ~ Dec into Jan 2016. MRI lumbar spine at [facility] on 06-13-2014 showed 2.7 x 2.4 x 1.9 cm soft tissue mass at L3-4 involving left neural foramen, with focal cortical destruction of adjacent L3 vertebral body and portion of left L4 pedicle. CT biopsy on 06-21-14, with pathology ([id number]) poorly differentiated carcinoma consistent with high grade serous. Bone scan 06-22-14 had uptake posteriorly in upper lumbar spine and a  subtle increased area of uptake right greater trochanter of unclear significance. IMRT by Dr Sondra Come 2-29 thru 08-12-14. Zometa x4 07-07-14 thru 10-2014. Progression at L1-2 and possible RLL lung nodule  found on PET 04-2015 and CT CAP 06-2015. Dose dense carbo taxol began 08-03-15. She was neutropenic by day 15 cycle 1. PET 11-06-15,after 3 cycles doses dense carbo taxol , had slight improvement in hypermetabolic uptake at H2-1, no change size/ no hypermetabolism 1.3 cm RLL pulmonary nodule, resolution of L2-3 area and no correlate to slight hypermetabolism at T11-12. Dose dense carbo taxol continued thru day 8 cycle 6 on 01-16-15, by which point she was not tolerating as well overall. Restaging PET 02-12-16 had some increase in paraspinous uptake without CT correlate. PDL-1 testing from 06-2014 pathology done 10-2015 had low expression of PDL-1, at only 2%. CT AP 04-09-16 demonstrated no change in soft tissue in paraspinous area and no other new or different findings. CT AP 06-03-16 essentially unchanged.  Objective:  Vital signs in last 24 hours:  BP 115/80 (BP Location: Left Arm, Patient Position: Sitting)   Pulse 69   Temp 98.1 F (36.7 C) (Oral)   Resp 18   Wt 112 lb 3.2 oz (50.9 kg)   SpO2 100%   BMI 22.66 kg/m  Weight up 3 lbs. Slight cough x 1 during exam. Does not appear ill, respirations not labored RA Alert, oriented and appropriate. Ambulatory without difficulty.    HEENT:PERRL, sclerae not icteric. Oral mucosa moist without lesions, posterior pharynx clear.  Neck supple. No JVD.  Lymphatics:no supraclavicular adenopathy Resp: clear to auscultation bilaterally and normal percussion bilaterally Cardio: regular rate and rhythm. No gallop. GI: soft, nontender, not distended, no mass or organomegaly. Normally active bowel sounds. Surgical incision not remarkable. Musculoskeletal/ Extremities: UE/ LE without pitting edema, cords, tenderness. Spine not tender to palpation.  Neuro: no change slight residual peripheral neuropathy. Otherwise nonfocal. PSYCH appropriate mood and afect Skin without rash, ecchymosis, petechiae Portacath-without erythema or tenderness  Lab Results:  Results  for orders placed or performed in visit on 06/03/16  Comprehensive metabolic panel  Result Value Ref Range   Sodium 141 136 - 145 mEq/L   Potassium 4.0 3.5 - 5.1 mEq/L   Chloride 106 98 - 109 mEq/L   CO2 27 22 - 29 mEq/L   Glucose 87 70 - 140 mg/dl   BUN 19.3 7.0 - 26.0 mg/dL   Creatinine 0.7 0.6 - 1.1 mg/dL   Total Bilirubin 0.88 0.20 - 1.20 mg/dL   Alkaline Phosphatase 46 40 - 150 U/L   AST 25 5 - 34 U/L   ALT 16 0 - 55 U/L   Total Protein 6.7 6.4 - 8.3 g/dL   Albumin 3.5 3.5 - 5.0 g/dL   Calcium 9.4 8.4 - 10.4 mg/dL   Anion Gap 9 3 - 11 mEq/L   EGFR 80 (L) >90 ml/min/1.73 m2  CBC with Differential  Result Value Ref Range   WBC 5.2 3.9 - 10.3 10e3/uL   NEUT# 3.0 1.5 - 6.5 10e3/uL   HGB 11.5 (L) 11.6 - 15.9 g/dL   HCT 34.7 (L) 34.8 - 46.6 %   Platelets 207 145 - 400 10e3/uL   MCV 93.4 79.5 - 101.0 fL   MCH 31.0 25.1 - 34.0 pg   MCHC 33.2 31.5 - 36.0 g/dL   RBC 3.72 3.70 - 5.45 10e6/uL   RDW 14.0 11.2 - 14.5 %   lymph# 1.1 0.9 - 3.3 10e3/uL   MONO# 0.8 0.1 -  0.9 10e3/uL   Eosinophils Absolute 0.2 0.0 - 0.5 10e3/uL   Basophils Absolute 0.0 0.0 - 0.1 10e3/uL   NEUT% 58.5 38.4 - 76.8 %   LYMPH% 20.9 14.0 - 49.7 %   MONO% 15.4 (H) 0.0 - 14.0 %   EOS% 4.4 0.0 - 7.0 %   BASO% 0.8 0.0 - 2.0 %     Studies/Results: CT ABDOMEN AND PELVIS WITH CONTRAST  06-03-16  COMPARISON:  04/08/2016  FINDINGS: Lower chest: Clear lung bases. Mild cardiomegaly, without pericardial or pleural effusion.  Hepatobiliary: Normal liver. Small gallstones without acute cholecystitis or biliary duct dilatation.  Pancreas: Mild pancreatic atrophy, without duct dilatation or dominant mass.  Spleen: Normal in size, without focal abnormality.  Adrenals/Urinary Tract: Normal adrenal glands. Normal kidneys, without hydronephrosis. Normal urinary bladder.  Stomach/Bowel: Normal stomach, without wall thickening. Scattered colonic diverticula. Normal terminal ileum. Normal small  bowel.  Vascular/Lymphatic: Advanced aortic and branch vessel atherosclerosis. No abdominopelvic adenopathy.  Reproductive: Hysterectomy.  No adnexal mass.  Other: No significant free fluid. No evidence of omental or peritoneal disease.  Musculoskeletal: Left paraspinous peripherally enhancing soft tissue mass is centered at the L3-4 level. This measures 2.1 x 2.1 cm transverse on image 30/series 2. Compare 1.9 x 1.9 cm on the prior. 2.2 cm craniocaudal on sagittal image 58 today versus similar on the prior exam (when remeasured).  Osteopenia. Convex left lumbar spine curvature. Grade 1 L4-5 anterolisthesis. Degenerative disc disease which is most advanced at L1-2.  IMPRESSION: 1. Similar to mild enlargement of a paravertebral soft tissue lesion at the L3-4 level. 2. No new sites of disease identified. 3. Cholelithiasis. 4. Hysterectomy. 5.  Aortic atherosclerosis.  PACs images reviewed with patient and family    Tomo mammograms scheduled at [facility] 06-21-16   Medications: I have reviewed the patient's current medications.  DISCUSSION Clinically she is stable and doing well; possible 2 mm change in CT measurement not sufficient to proceed with other treatment now. NOTE recommendation previously was doxil +/- avastin if additional treatment required.  She and family are relieved and pleased with decision to continue with observation for now. She will see Dr Alycia Rossetti in 08-2016.  She prefers to keep [facility] for now, understands that this needs to be flushed every 6-8 weeks when not otherwise used. She will have flush in March, then with Dr Elenora Gamma visit 09-04-16.  I have suggested that she meet Dr Alvy Bimler with PAC flush after Dr Elenora Gamma visit.     Assessment/Plan:  1.Papillary serous endometrial carcinoma, recurrent in pelvis and metastatic to LS: essentially stable CT and clinically continues to do very well on observation since 01-2015. Next appointment will be with  Dr Alycia Rossetti in 08-2016. Consider doxil avastin if additional treatment needed in future.   History is IB Aug 2009, treated with surgery, adjuvant carbo taxol and vaginal brachytherapy.  Recurrent at left pelvic sidewall 03-2011 and progression in lumbar spine in .06-2014. Radiation, zometa, additional carbo taxol x 6 thru 01-16-15.  HER 2 testing negative. ER negative. PDL-1  2%, not candidate for pembrolizumab  2.T1N0 right breast cancer 03-1999: adjuvant therapy included 5 years of tamoxifen. Not recurrent. Mammograms scheduled at [facility] 06-21-16, tomo due to extremely dense breast tissue.  3.scoliosis of lumbar spine with degenerative disc disease and chronic symptoms related to this, stable 4.HTN followed by Dr Virgina Jock. She had difficulty with prior chemo with  tachycardia and hypotension 5. Chemo neutropenia/ chemo leukopenia: related to total amount of chemo and radiation, required  gCSF with chemo. 6.mild anemia multifactorial,  Improved, hemoglobin up to 11.5 today 7.Marland KitchenPAC placed by IR on 09-13-15, continue to flush every 6-8 weeks when not otherwise used. 8. 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 10.plantar fasciitis: improved  11. Severe scoliosis with chronic symptoms related 12.aortic atherosclerosis on CT, copy of that report to PCP 13.small gallstones by CT 14.flu vaccine 01-17-16  All questions answered. Patient/ family are comfortable with discussion and in agreement with plans above, know to call prior to scheduled appointment if needed. Time spent 25 min including >50% counseling and coordination of care. CC Drs Claiborne Billings, Delena Serve, MD   06/06/2016, 5:15 PM

## 2016-06-08 ENCOUNTER — Encounter: Payer: Self-pay | Admitting: Oncology

## 2016-06-08 DIAGNOSIS — I700 Atherosclerosis of aorta: Secondary | ICD-10-CM | POA: Insufficient documentation

## 2016-06-19 ENCOUNTER — Telehealth: Payer: Self-pay

## 2016-06-19 NOTE — Telephone Encounter (Signed)
[name] brought in a copy of her Living will and [place]. Sent it to HIM to be scanned into to Patient's EMR.

## 2016-06-21 ENCOUNTER — Ambulatory Visit
Admission: RE | Admit: 2016-06-21 | Discharge: 2016-06-21 | Disposition: A | Discharge status: Home / Self Care | Payer: Medicare Other | Source: Ambulatory Visit | Attending: Internal Medicine | Admitting: Internal Medicine

## 2016-06-21 DIAGNOSIS — Z1231 Encounter for screening mammogram for malignant neoplasm of breast: Secondary | ICD-10-CM | POA: Diagnosis not present

## 2016-06-21 DIAGNOSIS — H2513 Age-related nuclear cataract, bilateral: Secondary | ICD-10-CM | POA: Diagnosis not present

## 2016-06-21 DIAGNOSIS — H524 Presbyopia: Secondary | ICD-10-CM | POA: Diagnosis not present

## 2016-06-21 DIAGNOSIS — H40013 Open angle with borderline findings, low risk, bilateral: Secondary | ICD-10-CM | POA: Diagnosis not present

## 2016-06-28 DIAGNOSIS — H40013 Open angle with borderline findings, low risk, bilateral: Secondary | ICD-10-CM | POA: Diagnosis not present

## 2016-07-04 DIAGNOSIS — R300 Dysuria: Secondary | ICD-10-CM | POA: Diagnosis not present

## 2016-07-04 DIAGNOSIS — R358 Other polyuria: Secondary | ICD-10-CM | POA: Diagnosis not present

## 2016-07-23 ENCOUNTER — Other Ambulatory Visit: Payer: Self-pay | Admitting: Hematology and Oncology

## 2016-07-23 DIAGNOSIS — C549 Malignant neoplasm of corpus uteri, unspecified: Secondary | ICD-10-CM

## 2016-07-23 MED ORDER — NAPROXEN 500 MG PO TABS: 500.0000 mg | ORAL_TABLET | Freq: Every day | ORAL | 1 refills | Status: DC | PRN

## 2016-07-24 ENCOUNTER — Ambulatory Visit (HOSPITAL_BASED_OUTPATIENT_CLINIC_OR_DEPARTMENT_OTHER): Payer: Medicare Other

## 2016-07-24 DIAGNOSIS — C7951 Secondary malignant neoplasm of bone: Secondary | ICD-10-CM | POA: Diagnosis not present

## 2016-07-24 DIAGNOSIS — Z452 Encounter for adjustment and management of vascular access device: Secondary | ICD-10-CM | POA: Diagnosis not present

## 2016-07-24 DIAGNOSIS — T451X5A Adverse effect of antineoplastic and immunosuppressive drugs, initial encounter: Secondary | ICD-10-CM

## 2016-07-24 DIAGNOSIS — D701 Agranulocytosis secondary to cancer chemotherapy: Secondary | ICD-10-CM

## 2016-07-24 DIAGNOSIS — C541 Malignant neoplasm of endometrium: Secondary | ICD-10-CM

## 2016-07-24 DIAGNOSIS — Z95828 Presence of other vascular implants and grafts: Secondary | ICD-10-CM

## 2016-07-24 MED ORDER — SODIUM CHLORIDE 0.9% FLUSH
10.0000 mL | INTRAVENOUS | Status: DC | PRN
  Administered 2016-07-24: 10 mL via INTRAVENOUS
  Filled 2016-07-24: qty 10

## 2016-07-24 MED ORDER — HEPARIN SOD (PORK) LOCK FLUSH 100 UNIT/ML IV SOLN
500.0000 [IU] | Freq: Once | INTRAVENOUS | Status: AC | PRN
  Administered 2016-07-24: 500 [IU] via INTRAVENOUS
  Filled 2016-07-24: qty 5

## 2016-08-27 ENCOUNTER — Other Ambulatory Visit: Payer: Self-pay | Admitting: Gynecologic Oncology

## 2016-08-27 DIAGNOSIS — C541 Malignant neoplasm of endometrium: Secondary | ICD-10-CM

## 2016-08-28 ENCOUNTER — Telehealth: Payer: Self-pay | Admitting: *Deleted

## 2016-08-28 NOTE — Telephone Encounter (Signed)
I have called and spoke with the patient regarding her CT scan. I have given her the instructions, she will pick up the contrast when she has lab work on Friday.

## 2016-08-30 ENCOUNTER — Other Ambulatory Visit (HOSPITAL_BASED_OUTPATIENT_CLINIC_OR_DEPARTMENT_OTHER): Payer: Medicare Other

## 2016-08-30 DIAGNOSIS — C541 Malignant neoplasm of endometrium: Secondary | ICD-10-CM | POA: Diagnosis present

## 2016-08-30 LAB — BUN AND CREATININE (CC13)
BUN: 18.2 mg/dL (ref 7.0–26.0)
Creatinine: 0.8 mg/dL (ref 0.6–1.1)
EGFR: 71 mL/min/{1.73_m2} — ABNORMAL LOW (ref >90)

## 2016-09-02 ENCOUNTER — Ambulatory Visit (HOSPITAL_COMMUNITY)
Admission: RE | Admit: 2016-09-02 | Discharge: 2016-09-02 | Disposition: A | Discharge status: Home / Self Care | Payer: Medicare Other | Source: Ambulatory Visit | Attending: Gynecologic Oncology | Admitting: Gynecologic Oncology

## 2016-09-02 ENCOUNTER — Encounter (HOSPITAL_COMMUNITY): Payer: Self-pay

## 2016-09-02 DIAGNOSIS — C541 Malignant neoplasm of endometrium: Secondary | ICD-10-CM | POA: Diagnosis not present

## 2016-09-02 DIAGNOSIS — Z90710 Acquired absence of both cervix and uterus: Secondary | ICD-10-CM | POA: Insufficient documentation

## 2016-09-02 MED ORDER — IOPAMIDOL (ISOVUE-300) INJECTION 61%
INTRAVENOUS | Status: AC
  Filled 2016-09-02: qty 100

## 2016-09-02 MED ORDER — HEPARIN SOD (PORK) LOCK FLUSH 100 UNIT/ML IV SOLN
INTRAVENOUS | Status: AC
  Administered 2016-09-02: 500 [IU]
  Filled 2016-09-02: qty 5

## 2016-09-02 MED ORDER — IOPAMIDOL (ISOVUE-300) INJECTION 61%
100.0000 mL | Freq: Once | INTRAVENOUS | Status: AC | PRN
  Administered 2016-09-02: 80 mL via INTRAVENOUS

## 2016-09-03 ENCOUNTER — Other Ambulatory Visit: Payer: Self-pay | Admitting: Hematology and Oncology

## 2016-09-03 DIAGNOSIS — C549 Malignant neoplasm of corpus uteri, unspecified: Secondary | ICD-10-CM

## 2016-09-03 DIAGNOSIS — C7951 Secondary malignant neoplasm of bone: Secondary | ICD-10-CM

## 2016-09-04 ENCOUNTER — Ambulatory Visit: Payer: Medicare Other | Attending: Gynecologic Oncology | Admitting: Gynecologic Oncology

## 2016-09-04 ENCOUNTER — Ambulatory Visit (HOSPITAL_BASED_OUTPATIENT_CLINIC_OR_DEPARTMENT_OTHER): Payer: Medicare Other

## 2016-09-04 ENCOUNTER — Encounter: Payer: Self-pay | Admitting: Gynecologic Oncology

## 2016-09-04 ENCOUNTER — Other Ambulatory Visit (HOSPITAL_BASED_OUTPATIENT_CLINIC_OR_DEPARTMENT_OTHER): Payer: Medicare Other

## 2016-09-04 VITALS — BP 154/74 | HR 70 | Temp 98.2°F | Resp 18 | Wt 109.0 lb

## 2016-09-04 DIAGNOSIS — Z923 Personal history of irradiation: Secondary | ICD-10-CM | POA: Insufficient documentation

## 2016-09-04 DIAGNOSIS — Z853 Personal history of malignant neoplasm of breast: Secondary | ICD-10-CM | POA: Insufficient documentation

## 2016-09-04 DIAGNOSIS — Z9221 Personal history of antineoplastic chemotherapy: Secondary | ICD-10-CM | POA: Insufficient documentation

## 2016-09-04 DIAGNOSIS — C541 Malignant neoplasm of endometrium: Secondary | ICD-10-CM | POA: Insufficient documentation

## 2016-09-04 DIAGNOSIS — Z87891 Personal history of nicotine dependence: Secondary | ICD-10-CM | POA: Insufficient documentation

## 2016-09-04 DIAGNOSIS — C7951 Secondary malignant neoplasm of bone: Secondary | ICD-10-CM

## 2016-09-04 DIAGNOSIS — Z7982 Long term (current) use of aspirin: Secondary | ICD-10-CM | POA: Diagnosis not present

## 2016-09-04 DIAGNOSIS — Z95828 Presence of other vascular implants and grafts: Secondary | ICD-10-CM

## 2016-09-04 DIAGNOSIS — Z90710 Acquired absence of both cervix and uterus: Secondary | ICD-10-CM | POA: Insufficient documentation

## 2016-09-04 DIAGNOSIS — C549 Malignant neoplasm of corpus uteri, unspecified: Secondary | ICD-10-CM

## 2016-09-04 DIAGNOSIS — M899 Disorder of bone, unspecified: Secondary | ICD-10-CM | POA: Diagnosis not present

## 2016-09-04 DIAGNOSIS — R911 Solitary pulmonary nodule: Secondary | ICD-10-CM | POA: Diagnosis not present

## 2016-09-04 DIAGNOSIS — Z8249 Family history of ischemic heart disease and other diseases of the circulatory system: Secondary | ICD-10-CM | POA: Diagnosis not present

## 2016-09-04 DIAGNOSIS — Z79899 Other long term (current) drug therapy: Secondary | ICD-10-CM | POA: Diagnosis not present

## 2016-09-04 DIAGNOSIS — Z9889 Other specified postprocedural states: Secondary | ICD-10-CM | POA: Insufficient documentation

## 2016-09-04 LAB — CBC WITH DIFFERENTIAL/PLATELET
BASO%: 0.8 % (ref 0.0–2.0)
Basophils Absolute: 0 10*3/uL (ref 0.0–0.1)
EOS%: 4 % (ref 0.0–7.0)
Eosinophils Absolute: 0.2 10*3/uL (ref 0.0–0.5)
HCT: 35.7 % (ref 34.8–46.6)
HGB: 11.9 g/dL (ref 11.6–15.9)
LYMPH%: 24.3 % (ref 14.0–49.7)
MCH: 30.9 pg (ref 25.1–34.0)
MCHC: 33.4 g/dL (ref 31.5–36.0)
MCV: 92.4 fL (ref 79.5–101.0)
MONO#: 0.8 10*3/uL (ref 0.1–0.9)
MONO%: 13.9 % (ref 0.0–14.0)
NEUT#: 3.2 10*3/uL (ref 1.5–6.5)
NEUT%: 57 % (ref 38.4–76.8)
Platelets: 206 10*3/uL (ref 145–400)
RBC: 3.86 10*6/uL (ref 3.70–5.45)
RDW: 13.8 % (ref 11.2–14.5)
WBC: 5.6 10*3/uL (ref 3.9–10.3)
lymph#: 1.4 10*3/uL (ref 0.9–3.3)

## 2016-09-04 LAB — COMPREHENSIVE METABOLIC PANEL
ALT: 16 U/L (ref 0–55)
AST: 26 U/L (ref 5–34)
Albumin: 3.7 g/dL (ref 3.5–5.0)
Alkaline Phosphatase: 54 U/L (ref 40–150)
Anion Gap: 10 mEq/L (ref 3–11)
BUN: 19.3 mg/dL (ref 7.0–26.0)
CO2: 27 mEq/L (ref 22–29)
Calcium: 9.8 mg/dL (ref 8.4–10.4)
Chloride: 104 mEq/L (ref 98–109)
Creatinine: 0.8 mg/dL (ref 0.6–1.1)
EGFR: 76 mL/min/{1.73_m2} — ABNORMAL LOW (ref >90)
Glucose: 89 mg/dl (ref 70–140)
Potassium: 4.2 mEq/L (ref 3.5–5.1)
Sodium: 141 mEq/L (ref 136–145)
Total Bilirubin: 0.69 mg/dL (ref 0.20–1.20)
Total Protein: 7.1 g/dL (ref 6.4–8.3)

## 2016-09-04 MED ORDER — SODIUM CHLORIDE 0.9% FLUSH
10.0000 mL | INTRAVENOUS | Status: DC | PRN
  Administered 2016-09-04: 10 mL via INTRAVENOUS
  Filled 2016-09-04: qty 10

## 2016-09-04 MED ORDER — HEPARIN SOD (PORK) LOCK FLUSH 100 UNIT/ML IV SOLN
500.0000 [IU] | Freq: Once | INTRAVENOUS | Status: AC
  Administered 2016-09-04: 500 [IU] via INTRAVENOUS
  Filled 2016-09-04: qty 5

## 2016-09-04 NOTE — Patient Instructions (Signed)
Implanted Port Home Guide An implanted port is a type of central line that is placed under the skin. Central lines are used to provide IV access when treatment or nutrition needs to be given through a person's veins. Implanted ports are used for long-term IV access. An implanted port may be placed because:  You need IV medicine that would be irritating to the small veins in your hands or arms.  You need long-term IV medicines, such as antibiotics.  You need IV nutrition for a long period.  You need frequent blood draws for lab tests.  You need dialysis.  Implanted ports are usually placed in the chest area, but they can also be placed in the upper arm, the abdomen, or the leg. An implanted port has two main parts:  Reservoir. The reservoir is round and will appear as a small, raised area under your skin. The reservoir is the part where a needle is inserted to give medicines or draw blood.  Catheter. The catheter is a thin, flexible tube that extends from the reservoir. The catheter is placed into a large vein. Medicine that is inserted into the reservoir goes into the catheter and then into the vein.  How will I care for my incision site? Do not get the incision site wet. Bathe or shower as directed by your health care provider. How is my port accessed? Special steps must be taken to access the port:  Before the port is accessed, a numbing cream can be placed on the skin. This helps numb the skin over the port site.  Your health care provider uses a sterile technique to access the port. ? Your health care provider must put on a mask and sterile gloves. ? The skin over your port is cleaned carefully with an antiseptic and allowed to dry. ? The port is gently pinched between sterile gloves, and a needle is inserted into the port.  Only "non-coring" port needles should be used to access the port. Once the port is accessed, a blood return should be checked. This helps ensure that the port  is in the vein and is not clogged.  If your port needs to remain accessed for a constant infusion, a clear (transparent) bandage will be placed over the needle site. The bandage and needle will need to be changed every week, or as directed by your health care provider.  Keep the bandage covering the needle clean and dry. Do not get it wet. Follow your health care provider's instructions on how to take a shower or bath while the port is accessed.  If your port does not need to stay accessed, no bandage is needed over the port.  What is flushing? Flushing helps keep the port from getting clogged. Follow your health care provider's instructions on how and when to flush the port. Ports are usually flushed with saline solution or a medicine called heparin. The need for flushing will depend on how the port is used.  If the port is used for intermittent medicines or blood draws, the port will need to be flushed: ? After medicines have been given. ? After blood has been drawn. ? As part of routine maintenance.  If a constant infusion is running, the port may not need to be flushed.  How long will my port stay implanted? The port can stay in for as long as your health care provider thinks it is needed. When it is time for the port to come out, surgery will be   done to remove it. The procedure is similar to the one performed when the port was put in. When should I seek immediate medical care? When you have an implanted port, you should seek immediate medical care if:  You notice a bad smell coming from the incision site.  You have swelling, redness, or drainage at the incision site.  You have more swelling or pain at the port site or the surrounding area.  You have a fever that is not controlled with medicine.  This information is not intended to replace advice given to you by your health care provider. Make sure you discuss any questions you have with your health care provider. Document  Released: 04/29/2005 Document Revised: 10/05/2015 Document Reviewed: 01/04/2013 Elsevier Interactive Patient Education  2017 [employer]  

## 2016-09-04 NOTE — Progress Notes (Signed)
Follow Up Note: Gyn-Onc  [name] 76 y.o. female  CC:  Chief Complaint  Patient presents with  . Endometrial Cancer    HPI:  [name] is seen for continuing attention to her recurrent endometrial carcinoma, post cycle 6 chemotherapy on 5/13. Cycle 5 was delayed a week due to counts, then taxol only given on 4-16 due to platelets of only 90K that day; she did have neulasta on 08-30-2011 (then enjoyed 2 weeks at beach). She then had radiation to persistent disease on the left pelvic side wall in 8/13.  History includes microinvasive T1N0 right breast cancer, which was ER/PR positive and HER-2 2+ with insufficient material for FISH at diagnosis in 03/1999, treated with lumpectomy with 2 sentinel node evaluation, local radiation and 5 years of Tamoxifen thru 05/2004, then aromatase inhibitor 07/2004 thru 04/2006.  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 She had 6 cycles of taxol/carboplatin through Jan 2010, and vaginal cuff brachytherapy. She did well until some vague LUQ symptoms in Nov. 2012, with CT AP in [facility] Mar 22, 2011 showing left pelvic sidewall involvement. She was taken to laparoscopic evaluation 04-25-2011, with findings of dense fibrosis in the area as well as mass encasing obturator nerve and vein as well as internal iliac vasculature; left ureterolysis was performed. No pathology was submitted from that procedure. We proceeded with initial 3 additional cycles of taxol/ carboplatin (with carbo skin tests) from 05-16-2011 thru 06-28-2011, then repeated CT AP on 07-17-2011. The CT showed improvement in the left pelvic sidewall mass now 1.9 x 2 cm and 3.4 cm cephalocaudad, as compared with 2.5 x 2.2 cm and 5.4 cm on CT 03-22-11, and no progressive disease elsewhere. She saw me after the CT, with recommendation for an additonal 3 cycles of same  chemotherapy then repeat scan. Cycle 4 was taxol + [place], with carbo skin test and neulasta, then cycle 5 as above only taxol.  She then completed radiation therapy under the care of Dr. Sondra Come which she completed in August of 2013. She had a CT scan on March 23, 2012 that revealed:   IMPRESSION:  1. Today's study demonstrates a positive response to therapy with decreased size of left pelvic sidewall nodal masses, as detailed above. No new soft tissue masses or new lymphadenopathy is identified within the abdomen or pelvis. Continue attention on follow-up studies is recommended.  2. Cholelithiasis without findings to suggest acute cholecystitis.  3. Colonic diverticulosis without findings to suggest acute diverticulitis at this time.  4. Mild asymmetric urinary bladder wall thickening is unchanged compared to prior examinations and is nonspecific. No definite bladder wall mass is identified at this time.  5. Additional findings, similar to prior examinations, as above.   There had been another lymph node identified on her CT scan in may that measured 2.1 x 1.7 cm. In reviewing her CT report it states that the nodal mass higher up along the pelvic sidewalls is no longer clearly identified although there continues to be some amorphus soft tissue in the region with the previously noted nodal mass resided. This was confirmed with the reading radiologist and communicated to the patient.   CT 06/22/12:  Normal urinary bladder. Hysterectomy. Decreased size of the left pelvic peripherally enhancing lesion. This measures 1.8 x 1.7 cm  on image 55/series 2 versus 2.4 x 2.1 cm on the prior exam.  No new lesions are identified. No significant free fluid.  Bones/Musculoskeletal: No acute osseous abnormality. Trace L4-L5 anterolisthesis is likely degenerative. There is S-shaped lumbar  spine curvature.  IMPRESSION:  1. Further decreased size of the left pelvic peripherally enhancing lesion.  2. No new sites of  new or progressive disease.  3. Esophageal air fluid level suggests dysmotility or gastroesophageal reflux.  4. Cholelithiasis.   CT 11/02/2012:  The index lesion within the left pelvic side wall measures 2.2 x 2.7 cm, image 55/series 2. Previously this measured 1.8 x 1.7 cm. No new lesions identified. The stomach and small bowel loops appear within normal limits. Normal appearance of the proximal colon. Multiple sigmoid diverticula identified without acute inflammation. Review of the visualized bony structures shows scoliosis and multilevel degenerative disc disease within the lumbar spine.  IMPRESSION:  1. The left pelvic side wall lesion demonstrates mild increase in size from previous exam.  2. No new sites of new or progressive disease.  3. Cholelithiasis.   CT 01/28/13: FINDINGS:  Lung bases are clear. No focal hepatic lesion. The gallbladder, pancreas, spleen, adrenal glands, and kidneys are normal. There are several gallstones within the lumen of the gallbladder. The stomach, small bowel, and cecum are normal. The diverticulum sigmoid colon. Rectum is normal. Abdominal aorta is normal in caliber. No retroperitoneal or periportal lymphadenopathy. No evidence of peritoneal disease. Within the pelvis, the left pelvic sidewall lobular enhancing mass which is slightly increased in size. The lesion is similar in axial dimension measuring 30 x 21 mm compared to the 29 x 22 mm on prior remeasured. In the craniocaudad dimension, lesion measures 26 mm 1ncreased from 21 mm on prior. No additional pelvic lymphadenopathy. Hysterectomy anatomy. Vaginal cuff appears normal. Bladder is normal.  Review of bone windows demonstrates no aggressive osseous lesions.  IMPRESSION:  1. Interval small increase in volume of enhancing mass adjacent to the left pelvic sidewall.  2. No evidence of abdominal or pelvic lymphadenopathy. 76 year old with recurrent uterine papillary serous carcinoma initially stage IB  diagnosed in 2009. Most recent imaging in 01/2012 revealed  interval enlargement of her left pelvic sidewall mass consistent with nodal disease.   PET 03/10/13: 1. Hypermetabolic mass in the deep left pelvis consistent with metastasis.  2. No additional evidence of local metastasis. No evidence of distant metastasis.  3. Small right lower lobe pulmonary nodule is not hypermetabolic. Recommend attention on follow-up.  She underwent resection of pelvic mass, left sided ureterolysis, intra-operative radiation therapy by radiation oncology, omental J flap, and left ureteral stent placement by urology at [facility]  on 04/02/13.  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  Final pathology resulted:  Omentum, biopsy: Mature adipose tissue, fibrous tissue, calcifications and bland appearing gland consistent with endosalpingiosis.  No diagnostic carcinoma identified, pancytokeratin confirmatory (negative).  Lymph nodes, left obturator, regional resection: Aggregate of lymph node (4.1 cm) involved by poorly differentiated high grade carcinoma (see Light Microscopy).  Abundant tumor necrosis.   She had her cystoscopy and ureteral stent removal on 05/20/2013. She had a CT scan March 17 that revealed no evidence of recurrent disease.   She had a CT scan performed on 02/05/14: IMPRESSION:  No CT findings for recurrent or metastatic disease involving the abdomen/ pelvis.  MRI lumbar spine at [facility] on 06-13-2014 showed known  scoliosis and degenerative disc disease, with new finding of 2.7 x 2.4 x 1.9 cm soft tissue mass at L3-4 involving left neural foramen, with focal cortical destruction of adjacent L3 vertebral body and portion of left L4 pedicle. She had CT biopsy on 06-21-14, with pathology ([id number]) poorly  differentiated carcinoma consistent with high grade serous. Bone scan 06-22-14 had uptake posteriorly in upper lumbar spine and a subtle increased area of uptake right greater trochanter of unclear significance. IMRT by Dr Sondra Come 2-29 thru 08-12-14. First zometa 07-07-14-6/16.  Post-XRT CT scan revealed: IMPRESSION: 1. Since the biopsy study of 06/21/2014, similar to slight decrease in size of a left paraspinous lesion at L3-4. 2. No new sites of metastatic disease identified. 3. 8 mm ground-glass nodule in the right lower lobe is grossly similar to 03/10/2013, suggesting a benign etiology. 4. Cholelithiasis. 5. Apparent sigmoid colonic wall thickening which could be due to underdistention. Colitis felt less likely. 6. Atherosclerosis, including within the coronary arteries. 7. Pelvic floor laxity.  CT 10/07/14: Musculoskeletal: Stable to slight interval decrease in size of left paraspinous lesion at the L3-4 level with peripheral enhancement measuring 1.6 x 1.7 cm (image 72; series 2), previously 2.0 x 1.8 cm.  IMPRESSION: Stable to slight interval decrease in size of left paraspinous lesion at L3-4.  No new sites of metastatic disease.  01/19/15: The left paraspinous lesion is again noted. Today this measures 11 mm, image 70/ series 2. Previously 1.7 cm. There are no new bone lesions identified.  IMPRESSION: 1. Decrease in size of left paraspinous metastasis. 2. No new sites of disease. 3. Stable ground-glass attenuating nodule within the superior segment of right lower lobe. 4. Gallstones.  PET 05/11/15: IMPRESSION: 1. The small soft tissue mass just lateral to the left L3 for neural foramen is hypermetabolic, favoring malignancy. 2. There is also a small focus of hypermetabolic activity between the left L1 and L2 transverse processes, but without a CT correlate. 3. The 13 mm sub solid nodule in the superior segment right lower lobe is stable from recent exams but has slowly  increased in size over the last 7 years. Although not hypermetabolic, the appearance is concerning for the possibility of a low grade adenocarcinoma.  She saw Dr. Roxan Hockey and cardiothoracic surgery in January. They recommended discuss the possibility of stereotactic radiation with Dr. Sondra Come versus follow-up. I have discussed the PET scan results with her. At that time as there had not been significant growth and there is no room to proceed with additional radiation she opted for short interval follow-up as her quality of life is good and she was completely asymptomatic. She comes in today in follow-up with a CT scan of the chest, abdomen and pelvis on 07/11/2015.  IMPRESSION: 1. 13 mm mixed solid and sub solid nodule in the posterior right lower lobe was present in 2009 and has clearly progressed in the interval since that study. Imaging features remain highly concerning for low-grade or well differentiated adenocarcinoma. 2. Slight increase size of in the hypermetabolic, rim enhancing nodule identified adjacent to the left L3-4 neural foramen. Metastatic disease remains a concern. 3. No evidence for discrete soft tissue lesion between the left L1 and 2 transverse processes, the site of focal FDG uptake on the recent PET-CT. 4. Cholelithiasis. 5. Abdominal aortic atherosclerosis.  She subsequently began dose dense carboplatin and paclitaxel on March 2017. She's undergone 3 cycles. She had to have growth factor support. She subsequent underwent a port placement on May 3 secondary  to poor IV access. After 3 cycles of chemotherapy she underwent a PET scan which revealed:  IMPRESSION: 1. Hypermetabolic left E0-9 paraspinous metastasis (biopsy-proven) is stable in size and slightly decreased in metabolism. 2. Previously described small focus of hypermetabolism between the left L2 and L3 spinous processes has resolved. 3. New mild linear hypermetabolism to the left of the T11-12 spinous processes  without discrete mass on the CT images, favor benign activity related activity, recommend attention on follow-up PET-CT. 4. No definite new sites of hypermetabolic metastatic disease. No recurrent hypermetabolic metastatic disease in the pelvis. 5. Interval stability of subsolid 1.3 cm superior segment right lower lobe pulmonary nodule without associated significant metabolism, which has grown compared to the 2009 chest CT study, and remain suspicious for low grade adenocarcinoma. 6. Additional findings include aortic atherosclerosis, coronary atherosclerosis, mild multinodular goiter with no hypermetabolic thyroid nodules, cholelithiasis and moderate sigmoid diverticulosis.  She did have PD-L1 testing and was negative. She then underwent 3 additional cycles of chemotherapy. She did not tolerate chemotherapy and it was stopped after cycle #6 day 8 secondary to fatigue and other toxicities. She had a PET scan on October 2 that revealed:  IMPRESSION 02/12/16: 1. Interval increase in size and metabolic activity of the LEFT paraspinal soft tissue metastasis. 2. New activity within the musculature of the LEFT chest wall. Given the unusual location of the paraspinal metastasis cannot exclude a second soft tissue metastasis to the musculature however favor benign physiologic activity. 3. Stable RIGHT lower lobe pulmonary nodule.  Interval History: She had a CT scan in January when she saw Dr. Marko Plume that revealed no new evidence of disease. It was compared to her CT scan November 27 of 2017. Within the left paraspinous area centered at L3-4 there was a mass that measured 2.1 x 2.1 cm. This was compared to 1.9 x 1.9. The 2.2 cm cranial caudal sagittal size was about the same.   After discussion with the family the patient opted for close follow-up and comes in today after having had a CT scan earlier this week that revealed: Musculoskeletal: LEFT paraspinal mass (L3-L4) measures 27 x 21 mm (image 29,  series 2) compared to 25 x 18 mm on prior remeasured. No new soft tissue lesions identified.  IMPRESSION: 1. Continue mild interval increase in size of LEFT paraspinal mass (1-2 mm). 2. No evidence of new metastatic disease in the abdomen pelvis. 3. Post hysterectomy anatomy.  I release the results to her via my chart yesterday she, her husband, and her daughter were able to look at the repeat results and may come in today. We have previously discussed that options included continued close follow-up is completely asymptomatic, consideration of additional radiotherapy if it's available, or chemotherapy. Surgery has been deemed not to be an option in this area. While I'm not sure there be any more room to work with I did discuss consideration of CyberKnife. We also discussed chemotherapy. At this point she would be open to a non-chemotherapy option. She would like for me to discuss the possibility of CyberKnife on this lesion with the radiation oncologist at [facility]. She has given me permission to take a disc with her last 2 scans and understands that we may need to get notes from Dr. Freddi Che treatment planning to see what the options might be. Pending this conversation I will call the patient and determine when she needs to come back. At this point she states that if they cannot do CyberKnife she is most  likely going to opt for close follow-up as she is enjoying a very good quality of life and is completely asymptomatic.  Review of Systems  Constitutional: Feels well    Current Meds:  Outpatient Encounter Prescriptions as of 09/04/2016  Medication Sig  . amLODipine (NORVASC) 5 MG tablet Take 5 mg by mouth daily.  Marland Kitchen aspirin EC 81 MG tablet Take 81 mg by mouth daily.  Marland Kitchen atenolol (TENORMIN) 25 MG tablet Take 25 mg by mouth 2 (two) times daily.   . Biotin 5000 MCG TABS Take 5,000 mcg by mouth daily.  . cholecalciferol (VITAMIN D) 1000 UNITS tablet Take 1,000 Units by mouth every Monday, Wednesday, and  Friday.  . Cyanocobalamin (VITAMIN B-12) 5000 MCG SUBL Place 1 tablet under the tongue daily.   Marland Kitchen lidocaine-prilocaine (EMLA) cream Apply to [facility] cath 1-2 hours prior to access as directed  . Magnesium 250 MG TABS Take 250 mg by mouth every Monday, Wednesday, and Friday.   . Multiple Vitamin (MULTIVITAMIN WITH MINERALS) TABS tablet Take 1 tablet by mouth daily.  . Omega-3 Fatty Acids (FISH OIL PO) Take 360 mg by mouth daily.  . potassium gluconate 595 MG TABS tablet Take 595 mg by mouth every Monday, Wednesday, and Friday.  . simvastatin (ZOCOR) 20 MG tablet Take 20 mg by mouth daily.  Marland Kitchen ibuprofen (ADVIL,MOTRIN) 200 MG tablet Take 200-400 mg by mouth 2 (two) times daily as needed. Reported on 11/13/2015  . naproxen (NAPROSYN) 500 MG tablet Take 1 tablet (500 mg total) by mouth daily as needed. (Patient not taking: Reported on 09/04/2016)  . [DISCONTINUED] ALPRAZolam (XANAX) 0.25 MG tablet Take 0.25 mg by mouth 2 (two) times daily as needed for anxiety (when flying). Reported on 11/13/2015  . [DISCONTINUED] benzonatate (TESSALON) 200 MG capsule 3 (three) times daily as needed.  . [DISCONTINUED] polyethylene glycol (MIRALAX / GLYCOLAX) packet Take 17 g by mouth daily as needed for mild constipation. Reported on 11/13/2015  . [DISCONTINUED] sodium chloride flush (NS) 0.9 % injection 10 mL    No facility-administered encounter medications on file as of 09/04/2016.     Allergy:  Allergies  Allergen Reactions  . Ace Inhibitors Anaphylaxis  . Codeine Nausea Only  . Demerol Nausea Only  . Dilaudid [Hydromorphone Hcl] Other (See Comments)    "total loss of her mind"    Social Hx:   Social History   Social History  . Marital status: Married    Spouse name: N/A  . Number of children: N/A  . Years of education: N/A   Occupational History  . Not on file.   Social History Main Topics  . Smoking status: Former Smoker    Types: Cigarettes    Quit date: 05/13/1962  . Smokeless tobacco: Never Used   . Alcohol use Yes     Comment: ocass  . Drug use: No  . Sexual activity: No   Other Topics Concern  . Not on file   Social History Narrative  . No narrative on file    Past Surgical Hx:  Past Surgical History:  Procedure Laterality Date  . APPENDECTOMY    . BREAST LUMPECTOMY    . FOOT SURGERY     RIGHT  . TONSILLECTOMY    . TOTAL ABDOMINAL HYSTERECTOMY W/ BILATERAL SALPINGOOPHORECTOMY      Past Medical Hx:  Past Medical History:  Diagnosis Date  . Endometrial cancer ([place]) 12/2007   s/p total abdominal hysterectomy at [facility] - ovaries were also removed   .  History of breast cancer 2000   right breast -- treated with lumpectomy and radiation therarpy, postoperatively with tamoxifen   . Hypercholesterolemia   . Hyperlipidemia   . Hypertension   . Palpitations   . PVC's (premature ventricular contractions)   . Radiation 07/11/14-08/12/14   left lumbar paraspinal area 55 gray  . S/P radiation therapy 10/28/11-12/05/11   5040 cGy left pelvis  . S/P radiation therapy    Intracavitary brachytherapy of uterus    Family Hx:  Family History  Problem Relation Age of Onset  . Thrombosis Father     coronary thrombosis  . Hypertension Father   . Heart failure Mother   . Coronary artery disease Brother     Vitals:  Blood pressure (!) 154/74, pulse 70, temperature 98.2 F (36.8 C), temperature source Oral, resp. rate 18, weight 109 lb (49.4 kg).  Physical Exam:  General: Well developed, well nourished female in no acute distress. Alert and oriented x 3.   Assessment/Plan:  76 year old with recurrent uterine papillary serous carcinoma initially stage IB diagnosed in 2009.  She is s/p resection of pelvic mass, left sided ureterolysis, intra-operative radiation therapy by radiation oncology, omental J flap, and left ureteral stent placement by urology at [facility]  on 04/02/13.  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   She then unfortunately suffered a recurrence in the left paraspinal space and was treated with Zometa as well as IMRT. Imaging shows that the lesion was initially smaller but slowly has grown in size. Also pulmonary nodule was identified that has been fairly slow growing. It has definitely grown since 2009. She has been seen by a thoracic surgery and by Dr. Sondra Come Dr. Sondra Come did not feel that there is any more room for radiation therapy for paraspinal mass.  She subsequently had undergone 3 cycles of dose dense carboplatin with a taxane. Her disease appeared stable with some potential decrease in the metabolic activity. She underwent an additional 3 cycles and had an increase in size of the mass and increasing FDG avidity.   We have previously discussed that options included continued close follow-up is completely asymptomatic, consideration of additional radiotherapy if it's available, or chemotherapy (liposomal doxorubicin, herceptin, bevacizumab, etc have been discussed). Surgery has been deemed not to be an option in this area. While I'm not sure there be any more room to work with I did discuss consideration of CyberKnife. We also discussed chemotherapy. At this point she would be open to a non-chemotherapy option. She would like for me to discuss the possibility of CyberKnife on this lesion with the radiation oncologist at [facility]. She has given me permission to take a disc with her last 2 scans and understands that we may need to get notes from Dr. Freddi Che treatment planning to see what the options might be. Pending this conversation I will call the patient and determine when she needs to come back. At this point she states that if they cannot do CyberKnife she is most likely going to opt for close follow-up as she is enjoying a very good quality of life and is completely  asymptomatic.  I will call her once I review her scans with radiation oncologist.  > 25 minutes face to face time with the patient, her husband, and her daughter reviewing her  CT scan and determine a treatment plan. Greater than 50% of this time was spent in consultation.     Amijah Timothy A.,  09/04/2016, 1:06 PM

## 2016-09-19 ENCOUNTER — Encounter: Payer: Self-pay | Admitting: Radiation Oncology

## 2016-09-23 ENCOUNTER — Other Ambulatory Visit: Payer: Self-pay | Admitting: Radiation Therapy

## 2016-09-23 DIAGNOSIS — C7951 Secondary malignant neoplasm of bone: Secondary | ICD-10-CM

## 2016-09-24 ENCOUNTER — Other Ambulatory Visit: Payer: Self-pay | Admitting: Radiation Oncology

## 2016-09-24 ENCOUNTER — Ambulatory Visit
Admission: RE | Admit: 2016-09-24 | Discharge: 2016-09-24 | Disposition: A | Discharge status: Home / Self Care | Payer: Medicare Other | Source: Ambulatory Visit | Attending: Radiation Oncology | Admitting: Radiation Oncology

## 2016-09-24 ENCOUNTER — Encounter: Payer: Self-pay | Admitting: Radiation Oncology

## 2016-09-24 VITALS — BP 157/68 | HR 72 | Temp 99.4°F | Resp 20 | Ht 59.0 in | Wt 110.6 lb

## 2016-09-24 DIAGNOSIS — Z90710 Acquired absence of both cervix and uterus: Secondary | ICD-10-CM | POA: Diagnosis not present

## 2016-09-24 DIAGNOSIS — Z803 Family history of malignant neoplasm of breast: Secondary | ICD-10-CM | POA: Diagnosis not present

## 2016-09-24 DIAGNOSIS — I10 Essential (primary) hypertension: Secondary | ICD-10-CM | POA: Diagnosis not present

## 2016-09-24 DIAGNOSIS — Z7982 Long term (current) use of aspirin: Secondary | ICD-10-CM | POA: Diagnosis not present

## 2016-09-24 DIAGNOSIS — C7951 Secondary malignant neoplasm of bone: Secondary | ICD-10-CM | POA: Insufficient documentation

## 2016-09-24 DIAGNOSIS — Z8249 Family history of ischemic heart disease and other diseases of the circulatory system: Secondary | ICD-10-CM | POA: Insufficient documentation

## 2016-09-24 DIAGNOSIS — Z9221 Personal history of antineoplastic chemotherapy: Secondary | ICD-10-CM | POA: Insufficient documentation

## 2016-09-24 DIAGNOSIS — Z510 Encounter for antineoplastic radiation therapy: Secondary | ICD-10-CM | POA: Diagnosis not present

## 2016-09-24 DIAGNOSIS — Z79899 Other long term (current) drug therapy: Secondary | ICD-10-CM | POA: Insufficient documentation

## 2016-09-24 DIAGNOSIS — C775 Secondary and unspecified malignant neoplasm of intrapelvic lymph nodes: Secondary | ICD-10-CM | POA: Insufficient documentation

## 2016-09-24 DIAGNOSIS — Z888 Allergy status to other drugs, medicaments and biological substances status: Secondary | ICD-10-CM | POA: Insufficient documentation

## 2016-09-24 DIAGNOSIS — Z923 Personal history of irradiation: Secondary | ICD-10-CM | POA: Diagnosis not present

## 2016-09-24 DIAGNOSIS — Z853 Personal history of malignant neoplasm of breast: Secondary | ICD-10-CM

## 2016-09-24 DIAGNOSIS — Z885 Allergy status to narcotic agent status: Secondary | ICD-10-CM | POA: Insufficient documentation

## 2016-09-24 DIAGNOSIS — Z8542 Personal history of malignant neoplasm of other parts of uterus: Secondary | ICD-10-CM | POA: Insufficient documentation

## 2016-09-24 DIAGNOSIS — E785 Hyperlipidemia, unspecified: Secondary | ICD-10-CM | POA: Diagnosis not present

## 2016-09-24 DIAGNOSIS — Z87891 Personal history of nicotine dependence: Secondary | ICD-10-CM | POA: Insufficient documentation

## 2016-09-24 DIAGNOSIS — C549 Malignant neoplasm of corpus uteri, unspecified: Secondary | ICD-10-CM

## 2016-09-24 NOTE — Progress Notes (Signed)
Please see the Nurse Progress Note in the MD Initial Consult Encounter for this patient. 

## 2016-09-24 NOTE — Progress Notes (Signed)
Radiation Oncology         ([phone]) [phone] ________________________________  Name: [name] MRN: [id number]  Date: 09/24/2016  DOB: Aug 15, 1940  FG:[id number], [name], MD  Nancy Marus, MD     REFERRING PHYSICIAN: Nancy Marus, MD   DIAGNOSIS: The primary encounter diagnosis was Malignant neoplasm of corpus uteri, except isthmus ([place]). 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   HISTORY OF PRESENT ILLNESS: [name] is a 76 y.o. female seen at the request of Dr. 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 The patient has a history of Stage I, T1N0, ER/PR positive, HER2 2+ ductal right breast cancer in 2000 which was treated with lumpectomy, sentinel lymph node assessment, radiotherapy and 5 years of tamoxifen 3 2006, followed [place] at ACE inhibitor until 2007. She was diagnosed with stage IB papillary serous endometrial carcinoma in August 2009, underwent radical hysterectomy with 15 negative lymph nodes however LVSI was noted. She completed 6 cycles of Taxol carboplatin in January 2010, followed by a vaginal cuff brachytherapy. Her uterine cancer recurred in the fall of 2012 in the pelvic sidewall. She continued chemotherapy, and proceeded with radiotherapy in 2013 to the site. She also developed a new mass in the pelvis, she subsequently underwent radical resection at [facility] in November 2014 with resection of pelvic mass, left ureteral lysis, intraoperative radiotherapy, omental J flap and left ureteral stent placement. She did well until 2016 when she had lumbar pain and an MRI in an orthopedic office revealed a 2.7 x 2.4 x 1.9 cm mass at the L3-L4 location with left neural foramen involvement, and focal destruction of L3. A CT guided biopsy confirmed high grade serous carcinoma consistent with her uterine cancer. She received Radiotherapy  to the lumbar spine, and began Zometa along with her systemic therapy. She had new pulmonary disease identified in the right lower lobe in January 2017, this was stabilized with chemotherapy, and her paraspinal mass was also relatively stable until her last scan on 09/02/16 measured this at 27 x 21 mm, previously 25 x 18 mm in January 2018. She does not have any other surgical options available, and comes today to discuss SRS to the paraspinal mass as a form of salvage therapy. Her last course of dose dense taxol/carboplatin chemotherapy was administered in September 2017.   PREVIOUS RADIATION THERAPY: Yes       07/11/14-08/12/14: 55 Gy in 25 fractions to the Lumbar spine  04/02/13: Intraoperative radiotherapy to the left pelvis.  10/28/11-12/05/11: Radiotherapy to the left pelvic sidewall, details not available at time of dictation  2010: Vaginal cuff brachytherapy, details of her dose are not available at time of dictation.  2000: Adjuvant radiotherapy to the right breast, details of her dose are not available at time dictation   PAST MEDICAL HISTORY:  Past Medical History:  Diagnosis Date  . Endometrial cancer ([place]) 12/2007   s/p total abdominal hysterectomy at [facility] - ovaries were also removed   . History of breast cancer 2000   right breast -- treated with lumpectomy and radiation therarpy, postoperatively with tamoxifen   . Hypercholesterolemia   . Hyperlipidemia   . Hypertension   . Palpitations   . PVC's (premature ventricular contractions)   . Radiation 07/11/14-08/12/14   left lumbar paraspinal area 55 gray  . S/P radiation therapy 10/28/11-12/05/11   5040 cGy left pelvis  .  S/P radiation therapy    Intracavitary brachytherapy of uterus       PAST SURGICAL HISTORY: Past Surgical History:  Procedure Laterality Date  . APPENDECTOMY    . BREAST LUMPECTOMY    . FOOT SURGERY     RIGHT  . TONSILLECTOMY    . TOTAL ABDOMINAL HYSTERECTOMY W/ BILATERAL SALPINGOOPHORECTOMY        FAMILY HISTORY:  Family History  Problem Relation Age of Onset  . Thrombosis Father        coronary thrombosis  . Hypertension Father   . Heart failure Mother   . Coronary artery disease Brother   . Breast cancer Paternal Aunt        in her 84s-50s     SOCIAL HISTORY:  reports that she quit smoking about 54 years ago. Her smoking use included Cigarettes. She has never used smokeless tobacco. She reports that she drinks alcohol. She reports that she does not use drugs. The patient is married and lives in [place]. She is retired from [employer] history. Her husband is retired from working at [employer] in administration.   ALLERGIES: Ace inhibitors; Codeine; Demerol; and Dilaudid [hydromorphone hcl]   MEDICATIONS:  Current Outpatient Prescriptions  Medication Sig Dispense Refill  . amLODipine (NORVASC) 5 MG tablet Take 5 mg by mouth daily.    Marland Kitchen aspirin EC 81 MG tablet Take 81 mg by mouth daily.    Marland Kitchen atenolol (TENORMIN) 25 MG tablet Take 25 mg by mouth 2 (two) times daily.     . Biotin 5000 MCG TABS Take 5,000 mcg by mouth daily.    . Cyanocobalamin (VITAMIN B-12) 5000 MCG SUBL Place 1 tablet under the tongue daily.     Marland Kitchen ibuprofen (ADVIL,MOTRIN) 200 MG tablet Take 200-400 mg by mouth 2 (two) times daily as needed. Reported on 11/13/2015    . lidocaine-prilocaine (EMLA) cream Apply to [facility] cath 1-2 hours prior to access as directed 30 g 1  . Magnesium 250 MG TABS Take 250 mg by mouth every Monday, Wednesday, and Friday.     . Multiple Vitamin (MULTIVITAMIN WITH MINERALS) TABS tablet Take 1 tablet by mouth daily.    . Omega-3 Fatty Acids (FISH OIL PO) Take 360 mg by mouth daily.    . potassium gluconate 595 MG TABS tablet Take 595 mg by mouth every Monday, Wednesday, and Friday.    . simvastatin (ZOCOR) 20 MG tablet Take 20 mg by mouth daily.    . cholecalciferol (VITAMIN D) 1000 UNITS tablet Take 1,000 Units by mouth every Monday, Wednesday, and Friday.    .  naproxen (NAPROSYN) 500 MG tablet Take 1 tablet (500 mg total) by mouth daily as needed. (Patient not taking: Reported on 09/04/2016) 60 tablet 1   No current facility-administered medications for this encounter.      REVIEW OF SYSTEMS: On review of systems, the patient reports that she is doing well overall. She denies any chest pain, shortness of breath, cough, fevers, chills, night sweats, unintended weight changes. She denies any bowel or bladder disturbances, and denies abdominal pain, nausea or vomiting. She denies any new musculoskeletal or joint aches or pains. 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 A complete review of systems is obtained and is otherwise negative.     PHYSICAL EXAM:  Wt Readings from Last 3 Encounters:  09/24/16 110 lb 9.6 oz (50.2 kg)  09/04/16 109 lb (49.4 kg)  06/06/16 112 lb 3.2 oz (50.9 kg)  Temp Readings from Last 3 Encounters:  09/24/16 99.4 F (37.4 C) (Oral)  09/04/16 98.2 F (36.8 C) (Oral)  06/06/16 98.1 F (36.7 C) (Oral)   BP Readings from Last 3 Encounters:  09/24/16 (!) 157/68  09/04/16 (!) 154/74  06/06/16 115/80   Pulse Readings from Last 3 Encounters:  09/24/16 72  09/04/16 70  06/06/16 69   Pain Assessment Pain Score: 0-No pain/10  In general this is a well appearing caucasian woman in no acute distress. She is alert and oriented x4 and appropriate throughout the examination. HEENT reveals that the patient is normocephalic, atraumatic. EOMs are intact. PERRLA. Skin is intact without any evidence of gross lesions. Cardiovascular exam reveals a regular rate and rhythm, no clicks rubs or murmurs are auscultated. Chest is clear to auscultation bilaterally. Lymphatic assessment is performed and does not reveal any adenopathy in the cervical, supraclavicular, axillary, or inguinal chains. Abdomen has active bowel sounds in all quadrants and is intact. The abdomen is soft, non tender, non distended. Lower extremities  are negative for pretibial pitting edema, deep calf tenderness, cyanosis or clubbing.   ECOG = 0  0 - Asymptomatic (Fully active, able to carry on all predisease activities without restriction)  1 - Symptomatic but completely ambulatory (Restricted in physically strenuous activity but ambulatory and able to carry out work of a light or sedentary nature. For example, light housework, office work)  2 - Symptomatic, <50% in bed during the day (Ambulatory and capable of all self care but unable to carry out any work activities. Up and about more than 50% of waking hours)  3 - Symptomatic, >50% in bed, but not bedbound (Capable of only limited self-care, confined to bed or chair 50% or more of waking hours)  4 - Bedbound (Completely disabled. Cannot carry on any self-care. Totally confined to bed or chair)  5 - Death   Eustace Pen MM, Creech RH, Tormey DC, et al. [id number]). "Toxicity and response criteria of the [facility]". [place] Oncol. 5 (6): 649-55    LABORATORY DATA:  Lab Results  Component Value Date   WBC 5.6 09/04/2016   HGB 11.9 09/04/2016   HCT 35.7 09/04/2016   MCV 92.4 09/04/2016   PLT 206 09/04/2016   Lab Results  Component Value Date   NA 141 09/04/2016   K 4.2 09/04/2016   CL 104 09/13/2015   CO2 27 09/04/2016   Lab Results  Component Value Date   ALT 16 09/04/2016   AST 26 09/04/2016   ALKPHOS 54 09/04/2016   BILITOT 0.69 09/04/2016      RADIOGRAPHY: Ct Abdomen Pelvis W Contrast  Result Date: 09/02/2016 CLINICAL DATA:  Endometrial carcinoma diagnosed 2009. Recurrence in 2016. Chemotherapy complete September 2017. EXAM: CT ABDOMEN AND PELVIS WITH CONTRAST TECHNIQUE: Multidetector CT imaging of the abdomen and pelvis was performed using the standard protocol following bolus administration of intravenous contrast. CONTRAST:  79m ISOVUE-300 IOPAMIDOL (ISOVUE-300) INJECTION 61% COMPARISON:  CT 06/03/2016 FINDINGS: Lower chest: Lung bases are  clear. Hepatobiliary: No focal hepatic lesion. Gallstones within noninflamed gallbladder. Pancreas: Pancreas is normal. No ductal dilatation. No pancreatic inflammation. Spleen: Normal spleen Adrenals/urinary tract: Adrenal glands and kidneys are normal. The ureters and bladder normal. Stomach/Bowel: Stomach, small bowel, appendix, and cecum are normal. The colon and rectosigmoid colon are normal. Vascular/Lymphatic: Calcification aorta. No retroperitoneal adenopathy. Reproductive: Post hysterectomy. Other: No peritoneal nodularity. Musculoskeletal: LEFT paraspinal mass (L3-L4) measures 27 x 21 mm (image 29, series 2) compared  to 25 x 18 mm on prior remeasured. No new soft tissue lesions identified. IMPRESSION: 1. Continue mild interval increase in size of LEFT paraspinal mass (1-2 mm). 2. No evidence of new metastatic disease in the abdomen pelvis. 3. Post hysterectomy anatomy. Electronically Signed   By: Stewart  Edmunds M.D.   On: 09/02/2016 18:06       IMPRESSION/PLAN: 1. 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 Dr. Moody reviews her pathology, and imaging studies to date. He reviews her previous oncology history including prior chemo and radiotherapy regimens. He discusses that we are gathering the details of her treatment, however she appears to only have been treated on one other occasion in the region we're considering stereotactic radiosurgery. He recommends a course of 3 fractions to the left paraspinal mass. We discussed the risks, benefits, short, and long term effects of radiotherapy, and the patient is interested in proceeding. Dr. Moody discusses the delivery and logistics of radiotherapy and details the role of Dr. Stern being involved in her planning and care.  2. Possible genetic predisposition to malignancy. The patient has a history of two cancers and has an aunt who also had  breast cancer. We will discuss her case with genetics and consider pursuing genetic testing based on her recommendations.   The above documentation reflects my direct findings during this shared patient visit. Please see the separate note by Dr. Moody on this date for the remainder of the patient's plan of care.    [name] C. Perkins, PAC  This document serves as a record of services personally performed by John Moody, MD and [name] Perkins, PA-C. It was created on their behalf by Leslie Hoyt, a trained medical scribe. The creation of this record is based on the scribe's personal observations and the provider's statements to them. This document has been checked and approved by the attending provider.  

## 2016-09-25 ENCOUNTER — Telehealth: Payer: Self-pay | Admitting: *Deleted

## 2016-09-25 ENCOUNTER — Telehealth: Payer: Self-pay | Admitting: Genetic Counselor

## 2016-09-25 ENCOUNTER — Encounter: Payer: Self-pay | Admitting: *Deleted

## 2016-09-25 NOTE — Telephone Encounter (Signed)
Received a call from [place] from [facility] to schedule the pt a genetic counseling appt. Appt has been scheduled for the pt to see Roma Kayser on 6/13 at 10am. She will notify the pt.

## 2016-09-25 NOTE — Telephone Encounter (Signed)
CALLED PATIENT TO INFORM OF GENETICS APPT. ON 10-23-16- ARRIVAL TIME - 9:45 AM WITH KAREN POWELL, SPOKE WITH PATIENT AND SHE IS AWARE OF THIS APPT.

## 2016-09-25 NOTE — Progress Notes (Signed)
1500 Called no answer left a message per Shona Simpson, P.A. To let Mrs. Harl know that  Genetics would like to speak with her and Shona Simpson, P.A. Has placed an order for her to meet with Roma Kayser.  Roma Kayser will set up a time to meet with her in the near future.

## 2016-09-26 ENCOUNTER — Ambulatory Visit
Admission: RE | Admit: 2016-09-26 | Discharge: 2016-09-26 | Disposition: A | Discharge status: Home / Self Care | Payer: Medicare Other | Source: Ambulatory Visit | Attending: Radiation Oncology | Admitting: Radiation Oncology

## 2016-09-26 DIAGNOSIS — M5126 Other intervertebral disc displacement, lumbar region: Secondary | ICD-10-CM | POA: Diagnosis not present

## 2016-09-26 DIAGNOSIS — C7951 Secondary malignant neoplasm of bone: Secondary | ICD-10-CM

## 2016-09-26 MED ORDER — GADOBENATE DIMEGLUMINE 529 MG/ML IV SOLN
9.0000 mL | Freq: Once | INTRAVENOUS | Status: AC | PRN
  Administered 2016-09-26: 9 mL via INTRAVENOUS

## 2016-09-27 ENCOUNTER — Ambulatory Visit
Admission: RE | Admit: 2016-09-27 | Discharge: 2016-09-27 | Disposition: A | Discharge status: Home / Self Care | Payer: Medicare Other | Source: Ambulatory Visit | Attending: Radiation Oncology | Admitting: Radiation Oncology

## 2016-09-27 DIAGNOSIS — C775 Secondary and unspecified malignant neoplasm of intrapelvic lymph nodes: Secondary | ICD-10-CM | POA: Diagnosis not present

## 2016-09-27 DIAGNOSIS — Z79899 Other long term (current) drug therapy: Secondary | ICD-10-CM | POA: Diagnosis not present

## 2016-09-27 DIAGNOSIS — Z8542 Personal history of malignant neoplasm of other parts of uterus: Secondary | ICD-10-CM | POA: Diagnosis not present

## 2016-09-27 DIAGNOSIS — C7951 Secondary malignant neoplasm of bone: Secondary | ICD-10-CM | POA: Diagnosis not present

## 2016-09-27 DIAGNOSIS — Z853 Personal history of malignant neoplasm of breast: Secondary | ICD-10-CM | POA: Diagnosis not present

## 2016-09-27 DIAGNOSIS — Z510 Encounter for antineoplastic radiation therapy: Secondary | ICD-10-CM | POA: Diagnosis not present

## 2016-10-02 DIAGNOSIS — I10 Essential (primary) hypertension: Secondary | ICD-10-CM | POA: Diagnosis not present

## 2016-10-02 DIAGNOSIS — C50919 Malignant neoplasm of unspecified site of unspecified female breast: Secondary | ICD-10-CM | POA: Diagnosis not present

## 2016-10-02 DIAGNOSIS — C541 Malignant neoplasm of endometrium: Secondary | ICD-10-CM | POA: Diagnosis not present

## 2016-10-02 DIAGNOSIS — M545 Low back pain: Secondary | ICD-10-CM | POA: Diagnosis not present

## 2016-10-02 DIAGNOSIS — C7951 Secondary malignant neoplasm of bone: Secondary | ICD-10-CM | POA: Diagnosis not present

## 2016-10-02 DIAGNOSIS — M4126 Other idiopathic scoliosis, lumbar region: Secondary | ICD-10-CM | POA: Diagnosis not present

## 2016-10-03 ENCOUNTER — Ambulatory Visit: Payer: Medicare Other | Admitting: Radiation Oncology

## 2016-10-04 ENCOUNTER — Telehealth: Payer: Self-pay | Admitting: *Deleted

## 2016-10-04 NOTE — Telephone Encounter (Signed)
Patient called and left message regarding medication for radiation treatment. Melissa NP reached out to Dr. Ida Rogue office and they will contact the patient. Message left on machine for patient that Dr. Ida Rogue office will contact her.

## 2016-10-04 NOTE — Telephone Encounter (Signed)
Returned call  Left voice message  Patient is requesting rx for anxiety, she is scheduled for SRS on 10/22/16, , left vm will relay information to our PA-C Shona Simpson and will have pharmacy call her when aniety medication has been called in, cal call me on Tuesday for any other concerns 4:57 PM

## 2016-10-08 ENCOUNTER — Other Ambulatory Visit: Payer: Self-pay | Admitting: Radiation Oncology

## 2016-10-08 DIAGNOSIS — C775 Secondary and unspecified malignant neoplasm of intrapelvic lymph nodes: Secondary | ICD-10-CM

## 2016-10-08 MED ORDER — LORAZEPAM 0.5 MG PO TABS: ORAL_TABLET | ORAL | 0 refills | Status: DC

## 2016-10-08 NOTE — Telephone Encounter (Signed)
Done Thanks [name]

## 2016-10-09 ENCOUNTER — Telehealth: Payer: Self-pay | Admitting: *Deleted

## 2016-10-09 NOTE — Telephone Encounter (Signed)
Called [facility] asked if the patient has picked up her rx ativan, "yes she has",thanked Academic librarian tech female 9:42 AM

## 2016-10-17 ENCOUNTER — Encounter: Payer: Medicare Other | Admitting: Genetics

## 2016-10-17 ENCOUNTER — Other Ambulatory Visit: Payer: Medicare Other

## 2016-10-22 ENCOUNTER — Encounter: Payer: Self-pay | Admitting: Radiation Oncology

## 2016-10-22 ENCOUNTER — Ambulatory Visit
Admission: RE | Admit: 2016-10-22 | Discharge: 2016-10-22 | Disposition: A | Discharge status: Home / Self Care | Payer: Medicare Other | Source: Ambulatory Visit | Attending: Radiation Oncology | Admitting: Radiation Oncology

## 2016-10-22 DIAGNOSIS — Z79899 Other long term (current) drug therapy: Secondary | ICD-10-CM | POA: Diagnosis not present

## 2016-10-22 DIAGNOSIS — C775 Secondary and unspecified malignant neoplasm of intrapelvic lymph nodes: Secondary | ICD-10-CM | POA: Diagnosis not present

## 2016-10-22 DIAGNOSIS — Z8542 Personal history of malignant neoplasm of other parts of uterus: Secondary | ICD-10-CM | POA: Diagnosis not present

## 2016-10-22 DIAGNOSIS — Z510 Encounter for antineoplastic radiation therapy: Secondary | ICD-10-CM | POA: Diagnosis not present

## 2016-10-22 DIAGNOSIS — Z853 Personal history of malignant neoplasm of breast: Secondary | ICD-10-CM | POA: Diagnosis not present

## 2016-10-22 DIAGNOSIS — C7951 Secondary malignant neoplasm of bone: Secondary | ICD-10-CM | POA: Diagnosis not present

## 2016-10-22 NOTE — Progress Notes (Addendum)
S/p SRS #1/3 spinal completed, no c/o pain, or nausea, no dizzy ness; monitor 15 minutes pr Lonell Grandchild, RT therapist , warm blanket offered, spouse at side, patient stated she took xana x at 2pm and asked if she could have a glass wine tonight with dinner, per Shona Simpson, PAC  Yes, patient eats dinner around 8pm,, patient stated she was so nervous, but that she got theough it okay, no driving today she is aware, 3:51 PM

## 2016-10-23 ENCOUNTER — Other Ambulatory Visit: Payer: Self-pay | Admitting: Hematology and Oncology

## 2016-10-23 ENCOUNTER — Encounter: Payer: Self-pay | Admitting: Genetic Counselor

## 2016-10-23 ENCOUNTER — Other Ambulatory Visit: Payer: Medicare Other

## 2016-10-23 ENCOUNTER — Ambulatory Visit (HOSPITAL_BASED_OUTPATIENT_CLINIC_OR_DEPARTMENT_OTHER): Payer: Medicare Other | Admitting: Genetic Counselor

## 2016-10-23 DIAGNOSIS — C549 Malignant neoplasm of corpus uteri, unspecified: Secondary | ICD-10-CM

## 2016-10-23 DIAGNOSIS — C541 Malignant neoplasm of endometrium: Secondary | ICD-10-CM | POA: Diagnosis not present

## 2016-10-23 DIAGNOSIS — Z803 Family history of malignant neoplasm of breast: Secondary | ICD-10-CM | POA: Diagnosis not present

## 2016-10-23 DIAGNOSIS — Z853 Personal history of malignant neoplasm of breast: Secondary | ICD-10-CM | POA: Diagnosis not present

## 2016-10-23 NOTE — Progress Notes (Signed)
REFERRING PROVIDER: Everitt Amber, MD [place], [place] [address]  PRIMARY PROVIDER:  Shon Baton, MD  PRIMARY REASON FOR VISIT:  1. [employer] Federation of Gyne[employer] and Obstetrics (FIGO) stage IVB malignant neoplasm of endometrium ([place])   2. History of breast cancer   3. Family history of breast cancer      HISTORY OF PRESENT ILLNESS:   [name], a 77 y.o. female, was seen for a [facility] cancer genetics consultation at the request of Dr. Denman George due to a personal and family history of cancer.  [name] presents to clinic today to discuss the possibility of a hereditary predisposition to cancer, genetic testing, and to further clarify her future cancer risks, as well as potential cancer risks for family members.   In 2000, at the age of 46, [name] was diagnosed with invasive ductal carcinoma of the right breast. This was treated with lumpectomy, radiation, tamoxifen for 5 years and Aromatase inhibitor.   In 2009, at the age of 57, [name] was diagnosed with uterine cancer.  She reports that at the time, testing was performed on the tumor that suggested her cancer was not hereditary.     CANCER HISTORY:    Malignant neoplasm of corpus uteri, except isthmus ([place])   12/22/2007 Surgery    Staging for IA UPSC       - 05/28/2008 Chemotherapy    6 cycles of paclitaxel and carboplatin with vaginal cuff brachytherapy      03/22/2011 Relapse/Recurrence    Left pelvic node recurrence      04/25/2011 Surgery    L/S evaluation, not resectable.      05/16/2011 - 09/25/2011 Chemotherapy    6 cyclces paclitaxel and carboplatin       - 12/27/2011 Radiation Therapy    radiation completed      03/10/2013 Progression    progression left pelvic mass      04/02/2013 Surgery    Resection and IORT      06/13/2014 Relapse/Recurrence    soft tissue mass at L3-4      07/11/2014 - 08/12/2014 Radiation Therapy    IMRT + zometa      02/12/2016 Progression          -  01/18/2016 Chemotherapy    The patient had ondansetron (ZOFRAN) IVPB 16 mg, 16 mg (100 % of original dose 16 mg), Intravenous,  Once, 5 of 5 cycles Dose modification: 16 mg (original dose 16 mg, Cycle 2)  CARBOplatin (PARAPLATIN) 390 mg in sodium chloride 0.9 % 100 mL chemo infusion, 390 mg (100 % of original dose 392 mg), Intravenous,  Once, 5 of 5 cycles Dose modification: 392 mg (original dose 392 mg, Cycle 1), 446.5 mg (original dose 392 mg, Cycle 2), 390 mg (original dose 392 mg, Cycle 2), 342 mg (original dose 392 mg, Cycle 6)  CARBOplatin chemo intradermal Test Dose 100 mcg, 0.1 mg, Intradermal,  Once, 5 of 5 cycles  PACLitaxel (TAXOL) 252 mg in dextrose 5 % 250 mL chemo infusion (> 46m/m2), 175 mg/m2 = 252 mg, Intravenous,  Once, 6 of 6 cycles Dose modification: 135 mg/m2 (original dose 175 mg/m2, Cycle 5)  CARBOplatin (PARAPLATIN) 250 mg in sodium chloride 0.9 % 100 mL chemo infusion, 250 mg (100 % of original dose 254.4 mg), Intravenous,  Once, 6 of 6 cycles Dose modification:   (original dose 254.4 mg, Cycle 1)  PACLitaxel (TAXOL) 114 mg in dextrose 5 % 250 mL chemo infusion ( ondansetron (  ZOFRAN) 16 mg, dexamethasone (DECADRON) 20 mg in sodium chloride 0.9 % 50 mL IVPB, , Intravenous,  Once, 6 of 6 cycles  for chemotherapy treatment.         Endometrial cancer ([place]) (Resolved)   12/01/2007 Initial Diagnosis    Endometrial cancer      12/15/2007 Surgery         01/22/2008 -  Chemotherapy    6 cycles paclitaxol and carboplatin       Radiation Therapy    HDR      03/31/2011 Relapse/Recurrence    chemotherapy and external beam radiation      05/16/2011 - 09/23/2011 Chemotherapy         10/28/2011 - 12/05/2011 Radiation Therapy    radiation for pelvic sidewall recurrence      03/10/2013 Progression         04/02/2013 Surgery    pelvic mass resection with IORT      06/13/2014 Relapse/Recurrence    + recurrence paraspinous muscle      07/11/2014 - 08/12/2014  Radiation Therapy         05/02/2015 Progression    increase size of paraspinal mass. In the prior radiation field.       Metastatic cancer to bone ([place])   06/27/2014 Initial Diagnosis    Metastatic cancer to bone        HORMONAL RISK FACTORS:  Menarche was at age 58.  First live birth at age 14.  OCP use for approximately 1-2 years.  Ovaries intact: no.  Hysterectomy: yes.  Menopausal status: postmenopausal.  HRT use: 8 years. Colonoscopy: yes; normal. Mammogram within the last year: yes. Number of breast biopsies: 1. Up to date with pelvic exams:  n/a. Any excessive radiation exposure in the past:  Just for cancer treatment  Past Medical History:  Diagnosis Date  . Endometrial cancer ([place]) 12/2007   s/p total abdominal hysterectomy at [facility] - ovaries were also removed   . Family history of breast cancer   . History of breast cancer    right breast -- treated with lumpectomy and radiation therarpy, postoperatively with tamoxifen   . Hypercholesterolemia   . Hyperlipidemia   . Hypertension   . Palpitations   . PVC's (premature ventricular contractions)   . Radiation 07/11/14-08/12/14   left lumbar paraspinal area 55 gray  . S/P radiation therapy 10/28/11-12/05/11   5040 cGy left pelvis  . S/P radiation therapy    Intracavitary brachytherapy of uterus    Past Surgical History:  Procedure Laterality Date  . APPENDECTOMY    . BREAST LUMPECTOMY    . FOOT SURGERY     RIGHT  . TONSILLECTOMY    . TOTAL ABDOMINAL HYSTERECTOMY W/ BILATERAL SALPINGOOPHORECTOMY      Social History   Social History  . Marital status: Married    Spouse name: N/A  . Number of children: N/A  . Years of education: N/A   Social History Main Topics  . Smoking status: Former Smoker    Types: Cigarettes    Quit date: 05/13/1962  . Smokeless tobacco: Never Used  . Alcohol use Yes     Comment: ocass  . Drug use: No  . Sexual activity: No   Other Topics Concern  . None   Social  History Narrative  . None     FAMILY HISTORY:  We obtained a detailed, 4-generation family history.  Significant diagnoses are listed below: Family History  Problem Relation Age of Onset  .  Thrombosis Father        coronary thrombosis  . Hypertension Father   . Heart failure Mother   . Coronary artery disease Brother   . Breast cancer Maternal Aunt        dx in her 59s  . Stroke Maternal Uncle   . Stroke Maternal Grandfather     The patient has two daughters who are cancer free.  Her only brother died at 82 from CAD.  Her parents are deceased. Her mother died at 57 from CHF and her father died at 74 from a heart attack.  Her mother had two sisters and a brother.  One sister had breast cancer in her 26's.  There I no other reported family history of cancer.  The patient's father had one brother and one sister, neither had cancer.  There is no other reported family history of cancer.  [name] is unaware of previous family history of genetic testing for hereditary cancer risks. Patient's maternal ancestors are of [place] descent, and paternal ancestors are of [place] descent. There is no reported Ashkenazi Jewish ancestry. There is no known consanguinity.  GENETIC COUNSELING ASSESSMENT: [name] is a 76 y.o. female with a personal and family history of cancer which is somewhat suggestive of a hereditary cancer syndrome and predisposition to cancer. We, therefore, discussed and recommended the following at today's visit.   DISCUSSION: We discussed that about 5-10% of breast cancer is hereditary with most cases due to BRCA mutations.  Other hereditary genes associated with breast cancer syndromes include ATM, CHEK2 and PALB2.  About 3-5% of uterine cancer is hereditary with most cases due to Lynch syndrome.  It sounds like this may have been screened for with her tumor testing in 2009 and was negative.  We discussed that at times, individuals who are treated with tamoxifen are  placed at higher risk for uterine cancer.  We reviewed the characteristics, features and inheritance patterns of hereditary cancer syndromes. We also discussed genetic testing, including the appropriate family members to test, the process of testing, insurance coverage and turn-around-time for results. We discussed the implications of a negative, positive and/or variant of uncertain significant result. We recommended [name] pursue genetic testing for the Common Hereditary cancer gene panel. The Hereditary Gene Panel offered by [facility] includes sequencing and/or deletion duplication testing of the following 46 genes: APC, ATM, AXIN2, BARD1, BMPR1A, BRCA1, BRCA2, BRIP1, CDH1, CDKN2A (p14ARF), CDKN2A (p16INK4a), CHEK2, CTNNA1, DICER1, EPCAM (Deletion/duplication testing only), GREM1 (promoter region deletion/duplication testing only), KIT, MEN1, MLH1, MSH2, MSH3, MSH6, MUTYH, NBN, NF1, NHTL1, PALB2, PDGFRA, PMS2, POLD1, POLE, PTEN, RAD50, RAD51C, RAD51D, SDHB, SDHC, SDHD, SMAD4, SMARCA4. STK11, TP53, TSC1, TSC2, and VHL.  The following genes were evaluated for sequence changes only: SDHA and HOXB13 c.251G>A variant only.  Based on [name]'s personal and family history of cancer, she meets medical criteria for genetic testing. Despite that she meets criteria, she may still have an out of pocket cost. We discussed that if her out of pocket cost for testing is over $100, the laboratory will call and confirm whether she wants to proceed with testing.  If the out of pocket cost of testing is less than $100 she will be billed by the genetic testing laboratory.   PLAN: After considering the risks, benefits, and limitations, [name]  provided informed consent to pursue genetic testing and the blood sample was sent to [facility] for analysis of the Common Hereditary cancer panel. Results should be  available within approximately 2-3 weeks' time, at which point they will be disclosed by telephone to Ms.  Sandoval, as will any additional recommendations warranted by these results. [name] will receive a summary of her genetic counseling visit and a copy of her results once available. This information will also be available in Epic. We encouraged [name] to remain in contact with cancer genetics annually so that we can continuously update the family history and inform her of any changes in cancer genetics and testing that may be of benefit for her family. [name] questions were answered to her satisfaction today. Our contact information was provided should additional questions or concerns arise.  Lastly, we encouraged [name] to remain in contact with cancer genetics annually so that we can continuously update the family history and inform her of any changes in cancer genetics and testing that may be of benefit for this family.   Ms.  Sandoval questions were answered to her satisfaction today. Our contact information was provided should additional questions or concerns arise. Thank you for the referral and allowing [place] to share in the care of your patient.   Lucerito Rosinski P. Florene Glen, [place], [facility] Certified Genetic Counselor [email or web address] phone: [phone]  The patient was seen for a total of 45 minutes in face-to-face genetic counseling.  This patient was discussed with Drs. Magrinat, Lindi Adie and/or Burr Medico who agrees with the above.    _______________________________________________________________________ For Office Staff:  Number of people involved in session: 1 Was an Intern/ student involved with case: no

## 2016-10-24 ENCOUNTER — Other Ambulatory Visit: Payer: Self-pay | Admitting: Hematology and Oncology

## 2016-10-24 ENCOUNTER — Ambulatory Visit (HOSPITAL_BASED_OUTPATIENT_CLINIC_OR_DEPARTMENT_OTHER): Payer: Medicare Other

## 2016-10-24 ENCOUNTER — Encounter: Payer: Self-pay | Admitting: Hematology and Oncology

## 2016-10-24 ENCOUNTER — Telehealth: Payer: Self-pay | Admitting: Hematology and Oncology

## 2016-10-24 ENCOUNTER — Other Ambulatory Visit (HOSPITAL_BASED_OUTPATIENT_CLINIC_OR_DEPARTMENT_OTHER): Payer: Medicare Other

## 2016-10-24 ENCOUNTER — Ambulatory Visit (HOSPITAL_BASED_OUTPATIENT_CLINIC_OR_DEPARTMENT_OTHER): Payer: Medicare Other | Admitting: Hematology and Oncology

## 2016-10-24 ENCOUNTER — Ambulatory Visit
Admission: RE | Admit: 2016-10-24 | Discharge: 2016-10-24 | Disposition: A | Discharge status: Home / Self Care | Payer: Medicare Other | Source: Ambulatory Visit | Attending: Radiation Oncology | Admitting: Radiation Oncology

## 2016-10-24 VITALS — BP 150/67 | HR 56 | Temp 98.1°F | Resp 20 | Ht 59.0 in | Wt 109.7 lb

## 2016-10-24 DIAGNOSIS — T451X5A Adverse effect of antineoplastic and immunosuppressive drugs, initial encounter: Secondary | ICD-10-CM

## 2016-10-24 DIAGNOSIS — C541 Malignant neoplasm of endometrium: Secondary | ICD-10-CM | POA: Diagnosis not present

## 2016-10-24 DIAGNOSIS — Z853 Personal history of malignant neoplasm of breast: Secondary | ICD-10-CM | POA: Diagnosis not present

## 2016-10-24 DIAGNOSIS — G620 Drug-induced polyneuropathy: Secondary | ICD-10-CM

## 2016-10-24 DIAGNOSIS — Z95828 Presence of other vascular implants and grafts: Secondary | ICD-10-CM

## 2016-10-24 DIAGNOSIS — Z510 Encounter for antineoplastic radiation therapy: Secondary | ICD-10-CM | POA: Diagnosis not present

## 2016-10-24 DIAGNOSIS — C775 Secondary and unspecified malignant neoplasm of intrapelvic lymph nodes: Secondary | ICD-10-CM | POA: Diagnosis not present

## 2016-10-24 DIAGNOSIS — C549 Malignant neoplasm of corpus uteri, unspecified: Secondary | ICD-10-CM | POA: Diagnosis not present

## 2016-10-24 DIAGNOSIS — C7951 Secondary malignant neoplasm of bone: Secondary | ICD-10-CM | POA: Diagnosis not present

## 2016-10-24 DIAGNOSIS — Z8542 Personal history of malignant neoplasm of other parts of uterus: Secondary | ICD-10-CM | POA: Diagnosis not present

## 2016-10-24 DIAGNOSIS — Z79899 Other long term (current) drug therapy: Secondary | ICD-10-CM | POA: Diagnosis not present

## 2016-10-24 LAB — COMPREHENSIVE METABOLIC PANEL
ALT: 16 U/L (ref 0–55)
AST: 27 U/L (ref 5–34)
Albumin: 3.5 g/dL (ref 3.5–5.0)
Alkaline Phosphatase: 50 U/L (ref 40–150)
Anion Gap: 9 mEq/L (ref 3–11)
BUN: 17.1 mg/dL (ref 7.0–26.0)
CO2: 27 mEq/L (ref 22–29)
Calcium: 10 mg/dL (ref 8.4–10.4)
Chloride: 106 mEq/L (ref 98–109)
Creatinine: 0.8 mg/dL (ref 0.6–1.1)
EGFR: 77 mL/min/{1.73_m2} — ABNORMAL LOW (ref >90)
Glucose: 126 mg/dl (ref 70–140)
Potassium: 3.9 mEq/L (ref 3.5–5.1)
Sodium: 142 mEq/L (ref 136–145)
Total Bilirubin: 0.8 mg/dL (ref 0.20–1.20)
Total Protein: 6.8 g/dL (ref 6.4–8.3)

## 2016-10-24 LAB — CBC WITH DIFFERENTIAL/PLATELET
BASO%: 0.2 % (ref 0.0–2.0)
Basophils Absolute: 0 10*3/uL (ref 0.0–0.1)
EOS%: 2.1 % (ref 0.0–7.0)
Eosinophils Absolute: 0.1 10*3/uL (ref 0.0–0.5)
HCT: 34.6 % — ABNORMAL LOW (ref 34.8–46.6)
HGB: 11.4 g/dL — ABNORMAL LOW (ref 11.6–15.9)
LYMPH%: 20 % (ref 14.0–49.7)
MCH: 31.3 pg (ref 25.1–34.0)
MCHC: 32.9 g/dL (ref 31.5–36.0)
MCV: 95.1 fL (ref 79.5–101.0)
MONO#: 0.7 10*3/uL (ref 0.1–0.9)
MONO%: 12.4 % (ref 0.0–14.0)
NEUT#: 3.4 10*3/uL (ref 1.5–6.5)
NEUT%: 65.3 % (ref 38.4–76.8)
Platelets: 172 10*3/uL (ref 145–400)
RBC: 3.64 10*6/uL — ABNORMAL LOW (ref 3.70–5.45)
RDW: 15.1 % — ABNORMAL HIGH (ref 11.2–14.5)
WBC: 5.3 10*3/uL (ref 3.9–10.3)
lymph#: 1.1 10*3/uL (ref 0.9–3.3)

## 2016-10-24 MED ORDER — HEPARIN SOD (PORK) LOCK FLUSH 100 UNIT/ML IV SOLN
500.0000 [IU] | Freq: Once | INTRAVENOUS | Status: AC
  Administered 2016-10-24: 500 [IU] via INTRAVENOUS
  Filled 2016-10-24: qty 5

## 2016-10-24 MED ORDER — SODIUM CHLORIDE 0.9% FLUSH
10.0000 mL | INTRAVENOUS | Status: DC | PRN
  Administered 2016-10-24: 10 mL via INTRAVENOUS
  Filled 2016-10-24: qty 10

## 2016-10-24 NOTE — Assessment & Plan Note (Signed)
She has remote history of breast cancer She will continue screening mammogram

## 2016-10-24 NOTE — Op Note (Signed)
   Name: [name]  MRN: [id number]  Date: 10/22/2016   DOB: Feb 13, 1941  Stereotactic Radiosurgery Operative Note  PRE-OPERATIVE DIAGNOSIS:  Spinal Metastasis  POST-OPERATIVE DIAGNOSIS:  Spinal Metastasis  PROCEDURE:  Stereotactic Radiosurgery  SURGEON:  Peggyann Shoals, MD  NARRATIVE: The patient underwent a radiation treatment planning session in the radiation oncology simulation suite under the care of the radiation oncology physician and physicist.  I participated closely in the radiation treatment planning afterwards. The patient underwent planning CT myelogram which was fused to the MRI.  These images were fused on the planning system.  Radiation oncology contoured the gross target volume and subsequently expanded this to yield the Planning Target Volume. I actively participated in the planning process.  I helped to define and review the target contours and also the contours of the spinal cord, and selected nearby organs at risk.  All the dose constraints for critical structures were reviewed and compared to AAPM Task Group 101.  The prescription dose conformity was reviewed.  I approved the plan electronically.    Accordingly, [name] was brought to the TrueBeam stereotactic radiation treatment linac and placed in the custom immobilization device.  The patient was aligned according to the IR fiducial markers with [employer] Exactrac, then orthogonal x-rays were used in ExacTrac with the 6DOF robotic table and the shifts were made to align the patient.  Then conebeam CT was performed to verify precision.  [name] received stereotactic radiosurgery uneventfully.  The detailed description of the procedure is recorded in the radiation oncology procedure note.  I was present for the duration of the procedure.  DISPOSITION:  Following delivery, the patient was transported to nursing in stable condition and monitored for possible acute effects to be discharged to home in stable  condition with follow-up in one month.  Peggyann Shoals, MD 10/24/2016 12:19 PM

## 2016-10-24 NOTE — Progress Notes (Signed)
1055 Arrived to room 10 ambulating with Williemae Natter, Radiation tech.  Nurse monitoring following SRR Lspine treatment complete.   Vital signs taken BP (!) 143/74   Pulse 67   Temp 98.3 F (36.8 C) (Oral)   Resp 16   SpO2 100%    Patient denies pain or any discomfort.  Moving lower extremities without discomfort. Denies fatigue. Patient without complaints.   Understands to avoid strenuous activity for the next 24 hours and call [phone] with needs.  Husband to arrive to take her home. 65 Discharged home with husband ambulatory upstairs for lab work.  Denies having any problems after her SRRI L spine procedure.

## 2016-10-24 NOTE — Progress Notes (Signed)
[facility] progress notes  Patient Care Team: Shon Baton, MD as PCP - General (Internal Medicine) Heath Lark, MD as Consulting Physician (Hematology and Oncology)  CHIEF COMPLAINTS/PURPOSE OF VISIT:  Recurrent endometrial cancer. The patient is accompanied by her husband, [name]  HISTORY OF PRESENTING ILLNESS:  [name] 76 y.o. female was transferred to my care after her prior physician has left.  She has complex past oncologic history of breast cancer and recurrent endometrial cancer I reviewed the patient's records extensive and collaborated the history with the patient. Summary of her history is as follows: Oncology History   Biopsy of recurrence 06-2014 ER negative. PDL1 testing low at 2% HER 2 negative on pathology [id number]     Malignant neoplasm of corpus uteri, except isthmus ([place])   12/22/2007 Surgery    Staging for IA UPSC       - 05/28/2008 Chemotherapy    6 cycles of paclitaxel and carboplatin with vaginal cuff brachytherapy      03/22/2011 Relapse/Recurrence    Left pelvic node recurrence      04/25/2011 Surgery    L/S evaluation, not resectable.      05/16/2011 - 09/25/2011 Chemotherapy    6 cyclces paclitaxel and carboplatin       - 12/27/2011 Radiation Therapy    radiation completed      03/10/2013 Progression    progression left pelvic mass      04/02/2013 Surgery    Resection and IORT      06/13/2014 Relapse/Recurrence    soft tissue mass at L3-4      07/11/2014 - 08/12/2014 Radiation Therapy    IMRT + zometa      02/12/2016 Progression          - 01/18/2016 Chemotherapy    The patient had ondansetron (ZOFRAN) IVPB 16 mg, 16 mg (100 % of original dose 16 mg), Intravenous,  Once, 5 of 5 cycles Dose modification: 16 mg (original dose 16 mg, Cycle 2)  CARBOplatin (PARAPLATIN) 390 mg in sodium chloride 0.9 % 100 mL chemo infusion, 390 mg (100 % of original dose 392 mg), Intravenous,  Once, 5 of 5 cycles Dose  modification: 392 mg (original dose 392 mg, Cycle 1), 446.5 mg (original dose 392 mg, Cycle 2), 390 mg (original dose 392 mg, Cycle 2), 342 mg (original dose 392 mg, Cycle 6)  CARBOplatin chemo intradermal Test Dose 100 mcg, 0.1 mg, Intradermal,  Once, 5 of 5 cycles  PACLitaxel (TAXOL) 252 mg in dextrose 5 % 250 mL chemo infusion (> '80mg'$ /m2), 175 mg/m2 = 252 mg, Intravenous,  Once, 6 of 6 cycles Dose modification: 135 mg/m2 (original dose 175 mg/m2, Cycle 5)  CARBOplatin (PARAPLATIN) 250 mg in sodium chloride 0.9 % 100 mL chemo infusion, 250 mg (100 % of original dose 254.4 mg), Intravenous,  Once, 6 of 6 cycles Dose modification:   (original dose 254.4 mg, Cycle 1)  PACLitaxel (TAXOL) 114 mg in dextrose 5 % 250 mL chemo infusion ( ondansetron (ZOFRAN) 16 mg, dexamethasone (DECADRON) 20 mg in sodium chloride 0.9 % 50 mL IVPB, , Intravenous,  Once, 6 of 6 cycles  for chemotherapy treatment.         Endometrial cancer ([place])   12/01/2007 Initial Diagnosis    She has recurrent papillary serous endometrial carcinoma. Briefly she was diagnosed in 2009 with a FIGO IB pappilary serous carcinoma treated with hysterectomy followed by adjuvant chemo and vaginal brachytherapy. She then had a recurrence diagnosed in  late 2012 that was deemed not to be surgically resectable, and was treated with 6 cycles of carbo/taxol followed by 5040 cGy of EBRT to the pelvic mass. She was then followed with serial imaging and was noted in June of this year to have slight increase in the size of the mass, and again in September there was small enlargement of the mass. She had a re-staging PET scan on 03/10/13 that showed FDG activity in the pelvic mass, with no other areas of disease      12/01/2007 Imaging    CT scan of chest, abdomen and pelvis: 1.  Mass like area of decreased attenuation in the central uterus, consistent with the known endometrial carcinoma.  Deep myometrial invasion is suspected.  Pelvic MRI without  and with contrast could be performed for further staging workup if clinically warranted. 2.  No evidence of extrauterine extension or lymphadenopathy. 3.  Sigmoid diverticulosis incidentally noted.      12/15/2007 Surgery    --12/2007 laparoscopic hysterectomy and PLND --05/2008 complted 6 cycles adjuvant carbo/taxol followed by vaginal cuff brachy --03/2011 exploratory lararoscopy, ureterolysis --4/13 completed 6 cycels carbotaxol --8/13 completed EBRT to the left pelvic sidewall       06/10/2008 Imaging    CT scan abdomen and pelvis 1.  Nonspecific mildly prominent inguinal lymph nodes, right greater than left. 2.  Interval hysterectomy without evidence of recurrent pelvic mass or fluid collection.      03/22/2011 Imaging    Ct scan abdomen and pelvis 1.  Local uterine cancer recurrence with two large nodes along the left pelvic sidewall. 2.  No evidence of bowel obstruction, urinary obstruction, or more distant metastasis.      03/31/2011 Relapse/Recurrence    chemotherapy and external beam radiation      05/16/2011 - 09/17/2011 Chemotherapy    She had 6 cycles of carboplatin and Taxol       07/17/2011 Imaging    Ct scan abdomen and pelvis 1.  Interval improvement in the previously demonstrated left pelvic local recurrence of tumor. 2.  No disease progression or complication identified. 3.  Mild bladder wall thickening on the right, nonspecific and possibly related to incomplete distension and/or radiation therapy. 4.  Cholelithiasis      10/11/2011 Imaging    CT scan abdomen and pelvis 1.  Interval enlargement of the left pelvic sidewall masses. 2.  No new nodular disease in the pelvis or lymphadenopathy. 3.  No evidence  of distant metastasis. 4.  Mild hydronephrosis on the right is similar to prior. 5.  Focal thickening of the right aspect the bladder is stable compared to prior.       10/28/2011 - 12/05/2011 Radiation Therapy    radiation for pelvic sidewall  recurrence      10/28/2011 - 12/05/2011 Radiation Therapy    10/28/11-12/05/11: Radiotherapy to the left pelvic sidewall      03/18/2012 Imaging    CT abdomen 1.  Today's study demonstrates a positive response to therapy with decreased size of left pelvic sidewall nodal masses, as detailed above.  No new soft tissue masses or new lymphadenopathy is identified within the abdomen or pelvis. Continue attention on follow-up studies is recommended. 2.  Cholelithiasis without findings to suggest acute cholecystitis. 3.  Colonic diverticulosis without findings to suggest acute diverticulitis at this time. 4.  Mild asymmetric urinary bladder wall thickening is unchanged compared to prior examinations and is nonspecific.  No definite bladder wall mass is identified at this time. 5.  Additional findings, similar to prior examinations, as above      06/22/2012 Imaging    CT abdomen 1.  Further decreased size of the left pelvic peripherally enhancing lesion. 2.  No new sites of new or progressive disease. 3. Esophageal air fluid level suggests dysmotility or gastroesophageal reflux. 4.  Cholelithiasis      11/02/2012 Imaging    CT abdomen 1.  The left pelvic side wall lesion demonstrates mild increase in size from previous exam. 2.  No new sites of new or progressive disease. 3.  Cholelithiasis.       01/28/2013 Imaging    CT abdomen 1. Interval small increase in volume of enhancing mass adjacent to the left pelvic sidewall. 2. No evidence of abdominal or pelvic lymphadenopathy      04/02/2013 Surgery    pelvic mass resection with IORT      04/02/2013 - 04/02/2013 Radiation Therapy    04/02/13: Intraoperative radiotherapy to the left pelvis      05/10/2013 PET scan    1. Hypermetabolic mass in the deep left pelvis consistent with metastasis. 2. No additional evidence of local metastasis. No evidence of distant metastasis. 3. Small right lower lobe pulmonary nodule is not  hypermetabolic.      07/27/2013 Imaging    No evidence of recurrent or metastatic disease. Apparent prior resection of the deep left pelvic metastasis.      02/08/2014 Imaging    No CT findings for recurrent or metastatic disease involving the abdomen/ pelvis      06/13/2014 Relapse/Recurrence    + recurrence paraspinous muscle      06/21/2014 Procedure    There is a soft tissue lesion along the left side of L3-L4. This lesion roughly measures up to 2.2 cm. Needle was positioned along the posterior aspect of the lesion. Small amount of air in the paraspinal tissues following needle removal      06/21/2014 Pathology Results    Bone, biopsy, left lumbar paraspinal - METASTATIC POORLY DIFFERENTIATED CARCINOMA, CONSISTENT WITH HIGH GRADE SEROUS CARCINOMA SEE COMMENT. Microscopic Comment The carcinoma demonstrates the following immunophenotype: Cytokeratin 7 - patchy moderate to strong expression Estrogen receptor - negative expression P53 - strong diffuse expression TTF-1 - negative expression WT-1 - negative expression CD56 - focal moderate strong expression Synaptophysin - negative expression GCDFP - negative expression The history of primary endometrial papillary serous carcinoma and primary mammary carcinoma is noted. In the current case, the overall morphologic and immunophenotype are that of poorly differentiated carcinoma, consistent with high grade serous carcinoma.       07/11/2014 - 08/12/2014 Radiation Therapy    07/11/14-08/12/14: 55 Gy in 25 fractions to the Lumbar spine      08/15/2014 Imaging    CT scan of chest, abdomen and pelvis 1. Since the biopsy study of 06/21/2014, similar to slight decrease in size of a left paraspinous lesion at L3-4. 2. No new sites of metastatic disease identified. 3. 8 mm ground-glass nodule in the right lower lobe is grossly similar to 03/10/2013, suggesting a benign etiology. 4. Cholelithiasis. 5. Apparent sigmoid colonic wall thickening which  could be due to underdistention. Colitis felt less likely. 6.  Atherosclerosis, including within the coronary arteries. 7. Pelvic floor laxity.      10/07/2014 Imaging    CT scan of chest, abdomen and pelvis: Stable to slight interval decrease in size of left paraspinous lesion at L3-4. No new sites of metastatic disease. Unchanged ground-glass nodule within the right lower lobe,  potentially benign in etiology. Recommend attention on followup. Cholelithiasis. Sigmoid colonic diverticulosis. No CT evidence to suggest acute diverticulitis.      01/19/2015 Imaging    Ct scan of abdomen and pelvis 1. Decrease in size of left paraspinous metastasis. 2. No new sites of disease. 3. Stable ground-glass attenuating nodule within the superior segment of right lower lobe. 4. Gallstones      05/02/2015 Imaging    Ct abdomen and pelvis: Slight interval increase in size of left lumbar paraspinal soft tissue metastasis. No new sites of metastatic disease identified within the abdomen or pelvis.       05/11/2015 PET scan    1. The small soft tissue mass just lateral to the left L3 for neural foramen is hypermetabolic, favoring malignancy. 2. There is also a small focus of hypermetabolic activity between the left L1 and L2 transverse processes, but without a CT correlate. 3. The 13 mm sub solid nodule in the superior segment right lower lobe is stable from recent exams but has slowly increased in size over the last 7 years. Although not hypermetabolic, the appearance is concerning for the possibility of a low grade adenocarcinoma. 4. Other imaging findings of potential clinical significance: Chronic bilateral maxillary sinusitis. Coronary, aortic arch, and branch vessel atherosclerotic vascular disease. Aortoiliac atherosclerotic vascular disease. Cholelithiasis. Sigmoid colon diverticulosis. Lumbar scoliosis.      07/11/2015 Imaging    Ct scan of chest, abdomen and pelvis: 1. 13 mm mixed solid and sub  solid nodule in the posterior right lower lobe was present in 2009 and has clearly progressed in the interval since that study. Imaging features remain highly concerning for low-grade or well differentiated adenocarcinoma. 2. Slight increase size of in the hypermetabolic, rim enhancing nodule identified adjacent to the left L3-4 neural foramen. Metastatic disease remains a concern. 3. No evidence for discrete soft tissue lesion between the left L1 and 2 transverse processes, the site of focal FDG uptake on the recent PET-CT. 4. Cholelithiasis. 5. Abdominal aortic atherosclerosis      08/03/2015 - 10/05/2015 Chemotherapy    She received weekly carboplatin and Taxol      11/06/2015 PET scan    1. Hypermetabolic left I0-9 paraspinous metastasis (biopsy-proven) is stable in size and slightly decreased in metabolism. 2. Previously described small focus of hypermetabolism between the left L2 and L3 spinous processes has resolved. 3. New mild linear hypermetabolism to the left of the T11-12 spinous processes without discrete mass on the CT images, favor benign activity related activity, recommend attention on follow-up PET-CT.  4. No definite new sites of hypermetabolic metastatic disease. No recurrent hypermetabolic metastatic disease in the pelvis. 5. Interval stability of subsolid 1.3 cm superior segment right lower lobe pulmonary nodule without associated significant metabolism, which has grown compared to the 2009 chest CT study, and remain suspicious for low grade adenocarcinoma. 6. Additional findings include aortic atherosclerosis, coronary atherosclerosis, mild multinodular goiter with no hypermetabolic thyroid nodules, cholelithiasis and moderate sigmoid diverticulosis.      02/12/2016 PET scan    1. Interval increase in size and metabolic activity of the LEFT paraspinal soft tissue metastasis. 2. New activity within the musculature of the LEFT chest wall. Given the unusual location of the  paraspinal metastasis cannot exclude a second soft tissue metastasis to the musculature however favor benign physiologic activity. 3. Stable RIGHT lower lobe pulmonary nodule. 4. No evidence of local recurrence.        04/08/2016 Imaging    Ct scan  of chest, abdomen and pelvis: 1. Similar size of a  left paravertebral abdominal lesion. 2. No new or progressive metastatic disease. 3. No correlate for the muscular activity about the lateral left chest wall. 4.  Coronary artery atherosclerosis. Aortic atherosclerosis. 5. Cholelithiasis. 6. Similar right lower lobe pulmonary nodule.      06/03/2016 Imaging    CT abdomen and pelvis 1. Similar to mild enlargement of a paravertebral soft tissue lesion at the L3-4 level. 2. No new sites of disease identified. 3. Cholelithiasis. 4. Hysterectomy. 5.  Aortic atherosclerosis.       09/02/2016 Imaging    CT abdomen and pelvis 1. Continue mild interval increase in size of LEFT paraspinal mass (1-2 mm). 2. No evidence of new metastatic disease in the abdomen pelvis. 3. Post hysterectomy anatomy      09/26/2016 Imaging    MR lumbar spine 1. Left paraspinal metastasis with epicenter adjacent to the left L3 neural foramen does extend into the foramen to the level of the left lateral epidural space (series 9, image 31), but also tracks cephalad and caudal along the L2-L4 lumbar plexus (series 10, image 15). No extension into the left L2 or L4 neural foramina. And no more distal extension of tumor. 2. Abnormal signal in the medial left psoas and ventral left erector spinae muscles at L2 and L3 is probably denervation related. 3. No bone invasion or osseous metastatic disease. Suspect previous radiation of the L2 through L5 spinal levels. 4. No dural or intradural metastatic disease.       10/22/2016 Procedure    She underwent stereotactic Radiosurgery       Metastatic cancer to bone ([place])   06/27/2014 Initial Diagnosis    Metastatic  cancer to bone      She is tolerating radiation therapy well She denies back pain She had mild residual peripheral neuropathy from prior chemotherapy Denies recent infection Denies changes in bowel habits No recent vaginal bleeding She denies any recent abnormal breast examination, palpable mass, abnormal breast appearance or nipple changes  MEDICAL HISTORY:  Past Medical History:  Diagnosis Date  . Endometrial cancer ([place]) 12/2007   s/p total abdominal hysterectomy at [facility] - ovaries were also removed   . Family history of breast cancer   . History of breast cancer    right breast -- treated with lumpectomy and radiation therarpy, postoperatively with tamoxifen   . Hypercholesterolemia   . Hyperlipidemia   . Hypertension   . Palpitations   . PVC's (premature ventricular contractions)   . Radiation 07/11/14-08/12/14   left lumbar paraspinal area 55 gray  . S/P radiation therapy 10/28/11-12/05/11   5040 cGy left pelvis  . S/P radiation therapy    Intracavitary brachytherapy of uterus    SURGICAL HISTORY: Past Surgical History:  Procedure Laterality Date  . APPENDECTOMY    . BREAST LUMPECTOMY    . FOOT SURGERY     RIGHT  . TONSILLECTOMY    . TOTAL ABDOMINAL HYSTERECTOMY W/ BILATERAL SALPINGOOPHORECTOMY      SOCIAL HISTORY: Social History   Social History  . Marital status: Married    Spouse name: [name]  . Number of children: 2  . Years of education: N/A   Occupational History  . retired    Social History Main Topics  . Smoking status: Former Smoker    Types: Cigarettes    Quit date: 05/13/1962  . Smokeless tobacco: Never Used  . Alcohol use Yes     Comment: ocass  .  Drug use: No  . Sexual activity: No   Other Topics Concern  . Not on file   Social History Narrative  . No narrative on file    FAMILY HISTORY: Family History  Problem Relation Age of Onset  . Thrombosis Father        coronary thrombosis  . Hypertension Father   . Heart failure  Mother   . Coronary artery disease Brother   . Breast cancer Maternal Aunt        dx in her 27s  . Stroke Maternal Uncle   . Stroke Maternal Grandfather     ALLERGIES:  is allergic to ace inhibitors; codeine; demerol; and dilaudid [hydromorphone hcl].  MEDICATIONS:  Current Outpatient Prescriptions  Medication Sig Dispense Refill  . amLODipine (NORVASC) 5 MG tablet Take 5 mg by mouth daily.    Marland Kitchen aspirin EC 81 MG tablet Take 81 mg by mouth daily.    Marland Kitchen atenolol (TENORMIN) 25 MG tablet Take 25 mg by mouth 2 (two) times daily.     . Biotin 5000 MCG TABS Take 5,000 mcg by mouth daily.    . cholecalciferol (VITAMIN D) 1000 UNITS tablet Take 1,000 Units by mouth every Monday, Wednesday, and Friday.    . Cyanocobalamin (VITAMIN B-12) 5000 MCG SUBL Place 1 tablet under the tongue daily.     Marland Kitchen ibuprofen (ADVIL,MOTRIN) 200 MG tablet Take 200-400 mg by mouth 2 (two) times daily as needed. Reported on 11/13/2015    . Magnesium 250 MG TABS Take 250 mg by mouth every Monday, Wednesday, and Friday.     . Multiple Vitamin (MULTIVITAMIN WITH MINERALS) TABS tablet Take 1 tablet by mouth daily.    . Omega-3 Fatty Acids (FISH OIL PO) Take 360 mg by mouth daily.    . potassium gluconate 595 MG TABS tablet Take 595 mg by mouth every Monday, Wednesday, and Friday.    . simvastatin (ZOCOR) 20 MG tablet Take 20 mg by mouth daily.    Marland Kitchen lidocaine-prilocaine (EMLA) cream Apply to [facility] cath 1-2 hours prior to access as directed (Patient not taking: Reported on 10/24/2016) 30 g 1  . LORazepam (ATIVAN) 0.5 MG tablet 1 tablet po 30 minutes prior to radiation or MRI (Patient not taking: Reported on 10/24/2016) 30 tablet 0  . naproxen (NAPROSYN) 500 MG tablet Take 1 tablet (500 mg total) by mouth daily as needed. (Patient not taking: Reported on 09/04/2016) 60 tablet 1   No current facility-administered medications for this visit.     REVIEW OF SYSTEMS:   Constitutional: Denies fevers, chills or abnormal night  sweats Eyes: Denies blurriness of vision, double vision or watery eyes Ears, nose, mouth, throat, and face: Denies mucositis or sore throat Respiratory: Denies cough, dyspnea or wheezes Cardiovascular: Denies palpitation, chest discomfort or lower extremity swelling Gastrointestinal:  Denies nausea, heartburn or change in bowel habits Skin: Denies abnormal skin rashes Lymphatics: Denies new lymphadenopathy or easy bruising Neurological:Denies numbness, tingling or new weaknesses Behavioral/Psych: Mood is stable, no new changes  All other systems were reviewed with the patient and are negative.  PHYSICAL EXAMINATION: ECOG PERFORMANCE STATUS: 1 - Symptomatic but completely ambulatory  Vitals:   10/24/16 1305  BP: (!) 150/67  Pulse: (!) 56  Resp: 20  Temp: 98.1 F (36.7 C)   Filed Weights   10/24/16 1305  Weight: 109 lb 11.2 oz (49.8 kg)    GENERAL:alert, no distress and comfortable SKIN: skin color, texture, turgor are normal, no rashes or significant lesions  EYES: normal, conjunctiva are pink and non-injected, sclera clear OROPHARYNX:no exudate, normal lips, buccal mucosa, and tongue  NECK: supple, thyroid normal size, non-tender, without nodularity LYMPH:  no palpable lymphadenopathy in the cervical, axillary or inguinal LUNGS: clear to auscultation and percussion with normal breathing effort HEART: regular rate & rhythm and no murmurs without lower extremity edema ABDOMEN:abdomen soft, non-tender and normal bowel sounds Musculoskeletal:no cyanosis of digits and no clubbing  PSYCH: alert & oriented x 3 with fluent speech NEURO: no focal motor/sensory deficits  LABORATORY DATA:  I have reviewed the data as listed Lab Results  Component Value Date   WBC 5.3 10/24/2016   HGB 11.4 (L) 10/24/2016   HCT 34.6 (L) 10/24/2016   MCV 95.1 10/24/2016   PLT 172 10/24/2016    Recent Labs  06/03/16 0934 08/30/16 1336 09/04/16 1120 10/24/16 1229  NA 141  --  141 142  K 4.0   --  4.2 3.9  CO2 27  --  27 27  GLUCOSE 87  --  89 126  BUN 19.3 18.2 19.3 17.1  CREATININE 0.7 0.8 0.8 0.8  CALCIUM 9.4  --  9.8 10.0  PROT 6.7  --  7.1 6.8  ALBUMIN 3.5  --  3.7 3.5  AST 25  --  26 27  ALT 16  --  16 16  ALKPHOS 46  --  54 50  BILITOT 0.88  --  0.69 0.80    RADIOGRAPHIC STUDIES: I have personally reviewed the radiological images as listed and agreed with the findings in the report. Mr Lumbar Spine W Wo Contrast  Result Date: 09/26/2016 CLINICAL DATA:  76 year old female with recurrent endometrial carcinoma. Progressive left lumbar paraspinal soft tissue mass first detected in early 2016. Study for stereotactic radiosurgery planning. EXAM: MRI LUMBAR SPINE WITHOUT AND WITH CONTRAST TECHNIQUE: Multiplanar and multiecho pulse sequences of the lumbar spine were obtained without and with intravenous contrast. CONTRAST:  41m MULTIHANCE GADOBENATE DIMEGLUMINE 529 MG/ML IV SOLN COMPARISON:  CT Abdomen and Pelvis 09/02/2016 and earlier. Prior PET-CT 02/12/2016 and earlier. FINDINGS: Segmentation: Normal, with hypoplastic ribs at T12. Full size ribs at T11. Alignment: Stable. Moderate to severe levoconvex scoliosis, apex at the thoracolumbar junction. Superimposed grade 1 anterolisthesis of L4 on L5. Vertebrae: Increased intrinsic T1 marrow signal in the lumbar levels L2 through L5 suggesting prior external radiation to these levels. More typical appearing T1 bone marrow signal in the sacrum, L1, and visible lower thoracic spine. There is low level STIR hyperintensity at the L1-L2 endplates which appears to be degenerative in nature. There is no suspicious bone lesion identified. Conus medullaris: Extends to the L1-L2 level and appears normal. Paraspinal and other soft tissues: Stable visible abdominal viscera including mild hydronephrosis. Sigmoid diverticulosis. There is a chronic heterogeneously enhancing soft tissue mass situated at the left L3 neural foramen and abutting the left  lateral L3 vertebral body and transverse process without apparent bone invasion. The epicenter of this mass is seen on series 11, image 36 and series 10, image 14. The epicenter is about 3 cm diameter. However from the mid L3 level, the mass appears to infiltrate the left lumbar plexus extending cephalad to the left L2 neural foramen (series 10, image 14), and caudal toward the left L4 neural foramen (series 10, image 15). These finger-like projections of the tumor each measure 8 year 9 mm diameter. The tumor does occupy the left lateral epidural space at the L3 neural foramen as seen on series 9, image 31,  but there is no further epidural or dural extension. There is associated confluent abnormal STIR hyperintensity which is probably denervation related edema in both the medial left psoas muscle, and also the ventral most aspect of the left erector spinae muscle at L2-L3, particularly near the left L2 neural foramen level as seen on series 6, image 16. There is also remote STIR hyperintensity within the left lumbosacral level erector spinae muscle (same image) which may air may not be related. Disc levels: Widespread chronic lumbar disc, endplate, and posterior element degeneration. IMPRESSION: 1. Left paraspinal metastasis with epicenter adjacent to the left L3 neural foramen does extend into the foramen to the level of the left lateral epidural space (series 9, image 31), but also tracks cephalad and caudal along the L2-L4 lumbar plexus (series 10, image 15). No extension into the left L2 or L4 neural foramina. And no more distal extension of tumor. 2. Abnormal signal in the medial left psoas and ventral left erector spinae muscles at L2 and L3 is probably denervation related. 3. No bone invasion or osseous metastatic disease. Suspect previous radiation of the L2 through L5 spinal levels. 4. No dural or intradural metastatic disease. Electronically Signed   By: Genevie Ann M.D.   On: 09/26/2016 15:45    ASSESSMENT  & PLAN:  Endometrial cancer ([place]) The patient had long-standing history of recurrent endometrial cancer Genetic testing is pending She is tolerating radiation therapy very well I recommend port flushes every 6-8 weeks Plan to see her back in 3 months with repeat CT scan to assess response to treatment  History of breast cancer She has remote history of breast cancer She will continue screening mammogram  Chemotherapy-induced neuropathy ([place]) She has trace residual peripheral neuropathy at the tips of fingers and feet It does not bother her Continue close observation   Orders Placed This Encounter  Procedures  . CT ABDOMEN PELVIS W CONTRAST    Standing Status:   Future    Standing Expiration Date:   01/24/2018    Order Specific Question:   Reason for Exam (SYMPTOM  OR DIAGNOSIS REQUIRED)    Answer:   staging metastatic endometrial ca, assess response to Rx    Order Specific Question:   Preferred imaging location?    Answer:   [facility]    All questions were answered. The patient knows to call the clinic with any problems, questions or concerns. I spent 40 minutes counseling the patient face to face. The total time spent in the appointment was 60 minutes and more than 50% was on counseling.     Heath Lark, MD 10/24/2016 5:02 PM

## 2016-10-24 NOTE — Assessment & Plan Note (Signed)
The patient had long-standing history of recurrent endometrial cancer Genetic testing is pending She is tolerating radiation therapy very well I recommend port flushes every 6-8 weeks Plan to see her back in 3 months with repeat CT scan to assess response to treatment

## 2016-10-24 NOTE — Patient Instructions (Signed)

## 2016-10-24 NOTE — Telephone Encounter (Signed)
Appointments scheduled per 10/24/16 los. Patient was given 2 bottles of Oral contrast with a copy of the instructions, per CT order. Patient was given a copy of the AVS report and appointment schedule, per 10/24/16 los.

## 2016-10-24 NOTE — Assessment & Plan Note (Signed)
She has trace residual peripheral neuropathy at the tips of fingers and feet It does not bother her Continue close observation

## 2016-10-25 LAB — CA 125: Cancer Antigen (CA) 125: 8.3 U/mL (ref 0.0–38.1)

## 2016-10-28 ENCOUNTER — Ambulatory Visit
Admission: RE | Admit: 2016-10-28 | Discharge: 2016-10-28 | Disposition: A | Discharge status: Home / Self Care | Payer: Medicare Other | Source: Ambulatory Visit | Attending: Radiation Oncology | Admitting: Radiation Oncology

## 2016-10-28 ENCOUNTER — Encounter: Payer: Self-pay | Admitting: Radiation Oncology

## 2016-10-28 DIAGNOSIS — C801 Malignant (primary) neoplasm, unspecified: Secondary | ICD-10-CM | POA: Diagnosis not present

## 2016-10-28 DIAGNOSIS — C7951 Secondary malignant neoplasm of bone: Secondary | ICD-10-CM | POA: Diagnosis not present

## 2016-10-28 DIAGNOSIS — C7949 Secondary malignant neoplasm of other parts of nervous system: Secondary | ICD-10-CM | POA: Diagnosis not present

## 2016-10-28 DIAGNOSIS — Z79899 Other long term (current) drug therapy: Secondary | ICD-10-CM | POA: Diagnosis not present

## 2016-10-28 DIAGNOSIS — Z510 Encounter for antineoplastic radiation therapy: Secondary | ICD-10-CM | POA: Diagnosis not present

## 2016-10-28 DIAGNOSIS — Z853 Personal history of malignant neoplasm of breast: Secondary | ICD-10-CM | POA: Diagnosis not present

## 2016-10-28 DIAGNOSIS — C775 Secondary and unspecified malignant neoplasm of intrapelvic lymph nodes: Secondary | ICD-10-CM | POA: Diagnosis not present

## 2016-10-28 DIAGNOSIS — Z8542 Personal history of malignant neoplasm of other parts of uterus: Secondary | ICD-10-CM | POA: Diagnosis not present

## 2016-10-31 ENCOUNTER — Telehealth: Payer: Self-pay | Admitting: Genetic Counselor

## 2016-10-31 ENCOUNTER — Encounter: Payer: Self-pay | Admitting: Genetic Counselor

## 2016-10-31 DIAGNOSIS — Z1379 Encounter for other screening for genetic and chromosomal anomalies: Secondary | ICD-10-CM | POA: Insufficient documentation

## 2016-10-31 NOTE — Telephone Encounter (Signed)
LM on VM with good news.  Asked that she CB. 

## 2016-11-01 ENCOUNTER — Telehealth: Payer: Self-pay | Admitting: Genetic Counselor

## 2016-11-01 NOTE — Telephone Encounter (Signed)
Revealed negative genetic testing.  Discussed that we do not know why she had breast or uterine cancer or why there is cancer in the family. It could be due to a different gene that we are not testing, or maybe our current technology may not be able to pick something up.  It will be important for her to keep in contact with genetics to keep up with whether additional testing may be needed.

## 2016-11-05 NOTE — Progress Notes (Signed)
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     Indication for treatment:  palliative       Radiation treatment dates:   10/22/2016 to 10/28/2016  Site/dose:   The L-spine was treated to 27 Gy in 3 fractions at 9 Gy per fraction.   Beams/energy:   SBRT // 10X FFF  Narrative: The patient tolerated radiation treatment relatively well.   The patient did not experience any adverse reaction to stereotactic radiosurgery.  Plan: The patient has completed radiation treatment. The patient will return to radiation oncology clinic for routine followup in one month. I advised them to call or return sooner if they have any questions or concerns related to their recovery or treatment.  ------------------------------------------------  Jodelle Gross, MD, PhD  This document serves as a record of services personally performed by Kyung Rudd, MD. It was created on his behalf by Arlyce Harman, a trained medical scribe. The creation of this record is based on the scribe's personal observations and the provider's statements to them. This document has been checked and approved by the attending provider.

## 2016-12-02 ENCOUNTER — Telehealth: Payer: Self-pay | Admitting: Radiation Oncology

## 2016-12-02 ENCOUNTER — Encounter: Payer: Self-pay | Admitting: Radiation Oncology

## 2016-12-02 ENCOUNTER — Ambulatory Visit
Admission: RE | Admit: 2016-12-02 | Discharge: 2016-12-02 | Disposition: A | Discharge status: Home / Self Care | Payer: Medicare Other | Source: Ambulatory Visit | Attending: Radiation Oncology | Admitting: Radiation Oncology

## 2016-12-02 VITALS — BP 173/71 | HR 63 | Temp 98.3°F | Wt 109.2 lb

## 2016-12-02 DIAGNOSIS — Z888 Allergy status to other drugs, medicaments and biological substances status: Secondary | ICD-10-CM | POA: Insufficient documentation

## 2016-12-02 DIAGNOSIS — Z9889 Other specified postprocedural states: Secondary | ICD-10-CM | POA: Diagnosis not present

## 2016-12-02 DIAGNOSIS — Z8249 Family history of ischemic heart disease and other diseases of the circulatory system: Secondary | ICD-10-CM | POA: Diagnosis not present

## 2016-12-02 DIAGNOSIS — Z170 Estrogen receptor positive status [ER+]: Secondary | ICD-10-CM | POA: Diagnosis not present

## 2016-12-02 DIAGNOSIS — R590 Localized enlarged lymph nodes: Secondary | ICD-10-CM | POA: Diagnosis not present

## 2016-12-02 DIAGNOSIS — Z853 Personal history of malignant neoplasm of breast: Secondary | ICD-10-CM | POA: Insufficient documentation

## 2016-12-02 DIAGNOSIS — Z923 Personal history of irradiation: Secondary | ICD-10-CM | POA: Diagnosis not present

## 2016-12-02 DIAGNOSIS — C541 Malignant neoplasm of endometrium: Secondary | ICD-10-CM | POA: Insufficient documentation

## 2016-12-02 DIAGNOSIS — Z791 Long term (current) use of non-steroidal anti-inflammatories (NSAID): Secondary | ICD-10-CM | POA: Diagnosis not present

## 2016-12-02 DIAGNOSIS — Z885 Allergy status to narcotic agent status: Secondary | ICD-10-CM | POA: Diagnosis not present

## 2016-12-02 DIAGNOSIS — Z79899 Other long term (current) drug therapy: Secondary | ICD-10-CM | POA: Diagnosis not present

## 2016-12-02 DIAGNOSIS — Z7982 Long term (current) use of aspirin: Secondary | ICD-10-CM | POA: Diagnosis not present

## 2016-12-02 DIAGNOSIS — R200 Anesthesia of skin: Secondary | ICD-10-CM | POA: Diagnosis not present

## 2016-12-02 DIAGNOSIS — Z87891 Personal history of nicotine dependence: Secondary | ICD-10-CM | POA: Diagnosis not present

## 2016-12-02 DIAGNOSIS — Z8542 Personal history of malignant neoplasm of other parts of uterus: Secondary | ICD-10-CM | POA: Diagnosis not present

## 2016-12-02 DIAGNOSIS — Z90710 Acquired absence of both cervix and uterus: Secondary | ICD-10-CM | POA: Insufficient documentation

## 2016-12-02 DIAGNOSIS — Z803 Family history of malignant neoplasm of breast: Secondary | ICD-10-CM | POA: Diagnosis not present

## 2016-12-02 DIAGNOSIS — I10 Essential (primary) hypertension: Secondary | ICD-10-CM | POA: Insufficient documentation

## 2016-12-02 DIAGNOSIS — F419 Anxiety disorder, unspecified: Secondary | ICD-10-CM | POA: Insufficient documentation

## 2016-12-02 DIAGNOSIS — Z823 Family history of stroke: Secondary | ICD-10-CM | POA: Diagnosis not present

## 2016-12-02 DIAGNOSIS — C7951 Secondary malignant neoplasm of bone: Secondary | ICD-10-CM | POA: Diagnosis not present

## 2016-12-02 HISTORY — DX: Personal history of irradiation: Z92.3

## 2016-12-02 NOTE — Telephone Encounter (Signed)
Entered In error

## 2016-12-02 NOTE — Progress Notes (Addendum)
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     Interval Since Last Radiation:  5 weeks   10/22/2016 to 10/28/2016:  The L-spine was treated to 27 Gy in 3 fractions at 9 Gy per fraction.   Narrative:  The patient returns today for routine follow-up. The patient tolerated raditoherapy well and did not have any untoward side effects. She is planning on a repeat CT scan in September and to see Dr. Alvy Bimler at that time to determine response to treatment.                            On review of systems, the patient states she's doing well overall since treatment and reports she was slightly anxious with the positioning devices we use, but otherwise felt that things went well. She denies any skin changes, chest pain, or shortness of breath. She has noticed numbness in her left knee for several months, and reports that this is not pain full, nor does it radiate down her leg or buttock. She denies any progressive neuropathic symptoms. No other complaints are verbalized.  ALLERGIES:  is allergic to ace inhibitors; codeine; demerol; and dilaudid [hydromorphone hcl].  Meds: Current Outpatient Prescriptions  Medication Sig Dispense Refill  . amLODipine (NORVASC) 5 MG tablet Take 5 mg by mouth daily.    Marland Kitchen aspirin EC 81 MG tablet Take 81 mg by mouth daily.    Marland Kitchen atenolol (TENORMIN) 25 MG tablet Take 25 mg by mouth 2 (two) times daily.     . Biotin 5000 MCG TABS Take 5,000 mcg by mouth daily.    . cholecalciferol (VITAMIN D) 1000 UNITS tablet Take 1,000 Units by mouth every Monday, Wednesday, and Friday.    . Cyanocobalamin (VITAMIN B-12) 5000 MCG SUBL Place 1 tablet under the  tongue daily.     Marland Kitchen ibuprofen (ADVIL,MOTRIN) 200 MG tablet Take 200-400 mg by mouth 2 (two) times daily as needed. Reported on 11/13/2015    . lidocaine-prilocaine (EMLA) cream Apply to [facility] cath 1-2 hours prior to access as directed 30 g 1  . Magnesium 250 MG TABS Take 250 mg by mouth every Monday, Wednesday, and Friday.     . Multiple Vitamin (MULTIVITAMIN WITH MINERALS) TABS tablet Take 1 tablet by mouth daily.    . Omega-3 Fatty Acids (FISH OIL PO) Take 360 mg by mouth daily.    . potassium gluconate 595 MG TABS tablet Take 595 mg by mouth every Monday, Wednesday, and Friday.    . simvastatin (ZOCOR) 20 MG tablet Take 20 mg by mouth daily.    Marland Kitchen LORazepam (ATIVAN) 0.5 MG tablet 1 tablet po 30 minutes prior to radiation or MRI (Patient not taking: Reported on 10/24/2016) 30 tablet 0  . naproxen (NAPROSYN) 500 MG tablet Take 1 tablet (500 mg total) by mouth daily as needed. (Patient not taking: Reported on 09/04/2016) 60 tablet 1   No current facility-administered medications for this encounter.     Physical Findings:  weight is 109 lb 4 oz (49.6 kg). Her oral temperature is 98.3 F (36.8 C). Her blood pressure is 173/71 (abnormal) and  her pulse is 63. Her oxygen saturation is 97%.  Pain Assessment Pain Score: 0-No pain/10 In general this is a well appearing caucasian female in no acute distress. She's alert and oriented x4 and appropriate throughout the examination. Cardiopulmonary assessment is negative for acute distress and she exhibits normal effort. The skin on her posterior thorax is intact without desquamation or hyperpigmentation. Her left knee is negative for edema.   Lab Findings: Lab Results  Component Value Date   WBC 5.3 10/24/2016   HGB 11.4 (L) 10/24/2016   HCT 34.6 (L) 10/24/2016   MCV 95.1 10/24/2016   PLT 172 10/24/2016     Radiographic Findings: No results found.  Impression/Plan: 1. 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  The patient will follow up with Dr. Alvy Bimler in September and will have restaging scans. I will discuss her case with Dr. Lisbeth Renshaw as we would typically proceed with MRI for surveillance and follow her in our brain and spine conference. I will let the patient know his preference in following her long term. 2. Hypertension. The patient is asymptomatic and attributes this to anxiety. She will follow up with her PCP if she is symptomatic.    3. Left knee numbness. The patient will follow up with orthopedics and oncology as needed. She will let us know as well if her symptoms progress.    Carola Rhine, PAC

## 2016-12-05 ENCOUNTER — Ambulatory Visit (HOSPITAL_BASED_OUTPATIENT_CLINIC_OR_DEPARTMENT_OTHER): Payer: Medicare Other

## 2016-12-05 DIAGNOSIS — T451X5A Adverse effect of antineoplastic and immunosuppressive drugs, initial encounter: Secondary | ICD-10-CM

## 2016-12-05 DIAGNOSIS — Z95828 Presence of other vascular implants and grafts: Secondary | ICD-10-CM

## 2016-12-05 DIAGNOSIS — Z853 Personal history of malignant neoplasm of breast: Secondary | ICD-10-CM

## 2016-12-05 DIAGNOSIS — D701 Agranulocytosis secondary to cancer chemotherapy: Secondary | ICD-10-CM

## 2016-12-05 DIAGNOSIS — C541 Malignant neoplasm of endometrium: Secondary | ICD-10-CM | POA: Diagnosis not present

## 2016-12-05 DIAGNOSIS — C7951 Secondary malignant neoplasm of bone: Secondary | ICD-10-CM

## 2016-12-05 MED ORDER — SODIUM CHLORIDE 0.9% FLUSH
10.0000 mL | Freq: Once | INTRAVENOUS | Status: AC
  Administered 2016-12-05: 10 mL
  Filled 2016-12-05: qty 10

## 2016-12-05 MED ORDER — HEPARIN SOD (PORK) LOCK FLUSH 100 UNIT/ML IV SOLN
500.0000 [IU] | Freq: Once | INTRAVENOUS | Status: AC
  Administered 2016-12-05: 500 [IU]
  Filled 2016-12-05: qty 5

## 2016-12-11 ENCOUNTER — Other Ambulatory Visit: Payer: Self-pay | Admitting: Radiation Therapy

## 2016-12-11 DIAGNOSIS — C7951 Secondary malignant neoplasm of bone: Secondary | ICD-10-CM

## 2016-12-12 DIAGNOSIS — H40013 Open angle with borderline findings, low risk, bilateral: Secondary | ICD-10-CM | POA: Diagnosis not present

## 2017-01-08 ENCOUNTER — Other Ambulatory Visit: Payer: Self-pay | Admitting: Hematology and Oncology

## 2017-01-08 DIAGNOSIS — C541 Malignant neoplasm of endometrium: Secondary | ICD-10-CM

## 2017-01-08 DIAGNOSIS — C7951 Secondary malignant neoplasm of bone: Secondary | ICD-10-CM

## 2017-01-21 ENCOUNTER — Other Ambulatory Visit (HOSPITAL_BASED_OUTPATIENT_CLINIC_OR_DEPARTMENT_OTHER): Payer: Medicare Other

## 2017-01-21 ENCOUNTER — Ambulatory Visit (HOSPITAL_COMMUNITY)
Admission: RE | Admit: 2017-01-21 | Discharge: 2017-01-21 | Disposition: A | Discharge status: Home / Self Care | Payer: Medicare Other | Source: Ambulatory Visit | Attending: Hematology and Oncology | Admitting: Hematology and Oncology

## 2017-01-21 DIAGNOSIS — K5730 Diverticulosis of large intestine without perforation or abscess without bleeding: Secondary | ICD-10-CM | POA: Insufficient documentation

## 2017-01-21 DIAGNOSIS — M4186 Other forms of scoliosis, lumbar region: Secondary | ICD-10-CM | POA: Insufficient documentation

## 2017-01-21 DIAGNOSIS — Z853 Personal history of malignant neoplasm of breast: Secondary | ICD-10-CM | POA: Diagnosis not present

## 2017-01-21 DIAGNOSIS — C541 Malignant neoplasm of endometrium: Secondary | ICD-10-CM | POA: Insufficient documentation

## 2017-01-21 DIAGNOSIS — I700 Atherosclerosis of aorta: Secondary | ICD-10-CM | POA: Diagnosis not present

## 2017-01-21 DIAGNOSIS — C7951 Secondary malignant neoplasm of bone: Secondary | ICD-10-CM

## 2017-01-21 DIAGNOSIS — R938 Abnormal findings on diagnostic imaging of other specified body structures: Secondary | ICD-10-CM | POA: Insufficient documentation

## 2017-01-21 DIAGNOSIS — K8020 Calculus of gallbladder without cholecystitis without obstruction: Secondary | ICD-10-CM | POA: Diagnosis not present

## 2017-01-21 LAB — COMPREHENSIVE METABOLIC PANEL
ALT: 17 U/L (ref 0–55)
AST: 31 U/L (ref 5–34)
Albumin: 3.9 g/dL (ref 3.5–5.0)
Alkaline Phosphatase: 53 U/L (ref 40–150)
Anion Gap: 11 mEq/L (ref 3–11)
BUN: 19.2 mg/dL (ref 7.0–26.0)
CO2: 27 mEq/L (ref 22–29)
Calcium: 10.3 mg/dL (ref 8.4–10.4)
Chloride: 103 mEq/L (ref 98–109)
Creatinine: 0.8 mg/dL (ref 0.6–1.1)
EGFR: 74 mL/min/{1.73_m2} — ABNORMAL LOW (ref >90)
Glucose: 79 mg/dl (ref 70–140)
Potassium: 3.6 mEq/L (ref 3.5–5.1)
Sodium: 141 mEq/L (ref 136–145)
Total Bilirubin: 0.79 mg/dL (ref 0.20–1.20)
Total Protein: 7.7 g/dL (ref 6.4–8.3)

## 2017-01-21 LAB — CBC WITH DIFFERENTIAL/PLATELET
BASO%: 0.6 % (ref 0.0–2.0)
Basophils Absolute: 0 10*3/uL (ref 0.0–0.1)
EOS%: 7.3 % — ABNORMAL HIGH (ref 0.0–7.0)
Eosinophils Absolute: 0.4 10*3/uL (ref 0.0–0.5)
HCT: 37.6 % (ref 34.8–46.6)
HGB: 12.3 g/dL (ref 11.6–15.9)
LYMPH%: 23.8 % (ref 14.0–49.7)
MCH: 32.2 pg (ref 25.1–34.0)
MCHC: 32.7 g/dL (ref 31.5–36.0)
MCV: 98.4 fL (ref 79.5–101.0)
MONO#: 0.8 10*3/uL (ref 0.1–0.9)
MONO%: 15.6 % — ABNORMAL HIGH (ref 0.0–14.0)
NEUT#: 2.7 10*3/uL (ref 1.5–6.5)
NEUT%: 52.7 % (ref 38.4–76.8)
Platelets: 176 10*3/uL (ref 145–400)
RBC: 3.82 10*6/uL (ref 3.70–5.45)
RDW: 13.9 % (ref 11.2–14.5)
WBC: 5.2 10*3/uL (ref 3.9–10.3)
lymph#: 1.2 10*3/uL (ref 0.9–3.3)

## 2017-01-21 MED ORDER — IOPAMIDOL (ISOVUE-300) INJECTION 61%
INTRAVENOUS | Status: AC
  Filled 2017-01-21: qty 100

## 2017-01-21 MED ORDER — HEPARIN SOD (PORK) LOCK FLUSH 100 UNIT/ML IV SOLN
INTRAVENOUS | Status: AC
  Filled 2017-01-21: qty 5

## 2017-01-21 MED ORDER — HEPARIN SOD (PORK) LOCK FLUSH 100 UNIT/ML IV SOLN
500.0000 [IU] | Freq: Once | INTRAVENOUS | Status: AC
  Administered 2017-01-21: 500 [IU] via INTRAVENOUS

## 2017-01-21 MED ORDER — IOPAMIDOL (ISOVUE-300) INJECTION 61%
100.0000 mL | Freq: Once | INTRAVENOUS | Status: AC | PRN
  Administered 2017-01-21: 100 mL via INTRAVENOUS

## 2017-01-21 NOTE — Telephone Encounter (Signed)
completed

## 2017-01-22 NOTE — Progress Notes (Signed)
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   Pain:Denies pain. Fatigue:Denies  Fatigue. Appetite:Reports a good appetite. Bowel/Bladder issues:No issues. Nausea:None Dizziness:No Weight: Wt Readings from Last 3 Encounters:  01/27/17 109 lb 12.8 oz (49.8 kg)  01/24/17 109 lb 14.4 oz (49.9 kg)  12/02/16 109 lb 4 oz (49.6 kg)  BP 131/76   Pulse 74   Temp 98.1 F (36.7 C) (Oral)   Resp 16   Ht 4\' 11"  (1.499 m)   Wt 109 lb 12.8 oz (49.8 kg)   SpO2 99%   BMI 22.18 kg/m

## 2017-01-23 ENCOUNTER — Other Ambulatory Visit: Payer: Medicare Other

## 2017-01-23 ENCOUNTER — Ambulatory Visit (HOSPITAL_BASED_OUTPATIENT_CLINIC_OR_DEPARTMENT_OTHER): Payer: Medicare Other

## 2017-01-23 ENCOUNTER — Ambulatory Visit
Admission: RE | Admit: 2017-01-23 | Discharge: 2017-01-23 | Disposition: A | Discharge status: Home / Self Care | Payer: Medicare Other | Source: Ambulatory Visit | Attending: Radiation Oncology | Admitting: Radiation Oncology

## 2017-01-23 DIAGNOSIS — T451X5A Adverse effect of antineoplastic and immunosuppressive drugs, initial encounter: Secondary | ICD-10-CM

## 2017-01-23 DIAGNOSIS — C7951 Secondary malignant neoplasm of bone: Secondary | ICD-10-CM

## 2017-01-23 DIAGNOSIS — Z95828 Presence of other vascular implants and grafts: Secondary | ICD-10-CM

## 2017-01-23 DIAGNOSIS — Z853 Personal history of malignant neoplasm of breast: Secondary | ICD-10-CM

## 2017-01-23 DIAGNOSIS — Z452 Encounter for adjustment and management of vascular access device: Secondary | ICD-10-CM | POA: Diagnosis not present

## 2017-01-23 DIAGNOSIS — M47816 Spondylosis without myelopathy or radiculopathy, lumbar region: Secondary | ICD-10-CM | POA: Diagnosis not present

## 2017-01-23 DIAGNOSIS — C541 Malignant neoplasm of endometrium: Secondary | ICD-10-CM

## 2017-01-23 DIAGNOSIS — D701 Agranulocytosis secondary to cancer chemotherapy: Secondary | ICD-10-CM

## 2017-01-23 MED ORDER — HEPARIN SOD (PORK) LOCK FLUSH 100 UNIT/ML IV SOLN
500.0000 [IU] | Freq: Once | INTRAVENOUS | Status: DC
  Filled 2017-01-23: qty 5

## 2017-01-23 MED ORDER — SODIUM CHLORIDE 0.9% FLUSH
10.0000 mL | Freq: Once | INTRAVENOUS | Status: AC
  Administered 2017-01-23: 10 mL
  Filled 2017-01-23: qty 10

## 2017-01-23 MED ORDER — GADOBENATE DIMEGLUMINE 529 MG/ML IV SOLN
10.0000 mL | Freq: Once | INTRAVENOUS | Status: AC | PRN
  Administered 2017-01-23: 10 mL via INTRAVENOUS

## 2017-01-23 NOTE — Patient Instructions (Signed)

## 2017-01-24 ENCOUNTER — Telehealth: Payer: Self-pay | Admitting: Hematology and Oncology

## 2017-01-24 ENCOUNTER — Ambulatory Visit (HOSPITAL_BASED_OUTPATIENT_CLINIC_OR_DEPARTMENT_OTHER): Payer: Medicare Other | Admitting: Hematology and Oncology

## 2017-01-24 ENCOUNTER — Encounter: Payer: Self-pay | Admitting: Hematology and Oncology

## 2017-01-24 DIAGNOSIS — G620 Drug-induced polyneuropathy: Secondary | ICD-10-CM | POA: Diagnosis not present

## 2017-01-24 DIAGNOSIS — C7951 Secondary malignant neoplasm of bone: Secondary | ICD-10-CM | POA: Diagnosis not present

## 2017-01-24 DIAGNOSIS — I10 Essential (primary) hypertension: Secondary | ICD-10-CM | POA: Insufficient documentation

## 2017-01-24 DIAGNOSIS — Z853 Personal history of malignant neoplasm of breast: Secondary | ICD-10-CM | POA: Diagnosis not present

## 2017-01-24 DIAGNOSIS — T451X5A Adverse effect of antineoplastic and immunosuppressive drugs, initial encounter: Secondary | ICD-10-CM

## 2017-01-24 DIAGNOSIS — C541 Malignant neoplasm of endometrium: Secondary | ICD-10-CM | POA: Diagnosis not present

## 2017-01-24 NOTE — Assessment & Plan Note (Signed)
She has trace residual peripheral neuropathy at the tips of fingers and feet It does not bother her Continue close observation

## 2017-01-24 NOTE — Progress Notes (Signed)
[name] OFF[facility] PROGRESS NOTE  Patient Care Team: Shon Baton, MD as PCP - General (Internal Medicine) Heath Lark, MD as Consulting Physician (Hematology and Oncology)  SUMMARY OF ONCOLOGIC HISTORY: Oncology History   Biopsy of recurrence 06-2014 ER negative. PDL1 testing low at 2% HER 2 negative on pathology [id number] Negative genetic testing     Malignant neoplasm of corpus uteri, except isthmus ([place])   12/22/2007 Surgery    Staging for IA UPSC       - 05/28/2008 Chemotherapy    6 cycles of paclitaxel and carboplatin with vaginal cuff brachytherapy      03/22/2011 Relapse/Recurrence    Left pelvic node recurrence      04/25/2011 Surgery    L/S evaluation, not resectable.      05/16/2011 - 09/25/2011 Chemotherapy    6 cyclces paclitaxel and carboplatin       - 12/27/2011 Radiation Therapy    radiation completed      03/10/2013 Progression    progression left pelvic mass      04/02/2013 Surgery    Resection and IORT      06/13/2014 Relapse/Recurrence    soft tissue mass at L3-4      07/11/2014 - 08/12/2014 Radiation Therapy    IMRT + zometa      02/12/2016 Progression          - 01/18/2016 Chemotherapy    The patient had ondansetron (ZOFRAN) IVPB 16 mg, 16 mg (100 % of original dose 16 mg), Intravenous,  Once, 5 of 5 cycles Dose modification: 16 mg (original dose 16 mg, Cycle 2)  CARBOplatin (PARAPLATIN) 390 mg in sodium chloride 0.9 % 100 mL chemo infusion, 390 mg (100 % of original dose 392 mg), Intravenous,  Once, 5 of 5 cycles Dose modification: 392 mg (original dose 392 mg, Cycle 1), 446.5 mg (original dose 392 mg, Cycle 2), 390 mg (original dose 392 mg, Cycle 2), 342 mg (original dose 392 mg, Cycle 6)  CARBOplatin chemo intradermal Test Dose 100 mcg, 0.1 mg, Intradermal,  Once, 5 of 5 cycles  PACLitaxel (TAXOL) 252 mg in dextrose 5 % 250 mL chemo infusion (> '80mg'$ /m2), 175 mg/m2 = 252 mg, Intravenous,  Once, 6 of 6 cycles Dose modification:  135 mg/m2 (original dose 175 mg/m2, Cycle 5)  CARBOplatin (PARAPLATIN) 250 mg in sodium chloride 0.9 % 100 mL chemo infusion, 250 mg (100 % of original dose 254.4 mg), Intravenous,  Once, 6 of 6 cycles Dose modification:   (original dose 254.4 mg, Cycle 1)  PACLitaxel (TAXOL) 114 mg in dextrose 5 % 250 mL chemo infusion ( ondansetron (ZOFRAN) 16 mg, dexamethasone (DECADRON) 20 mg in sodium chloride 0.9 % 50 mL IVPB, , Intravenous,  Once, 6 of 6 cycles  for chemotherapy treatment.         Endometrial cancer ([place])   12/01/2007 Initial Diagnosis    She has recurrent papillary serous endometrial carcinoma. Briefly she was diagnosed in 2009 with a FIGO IB pappilary serous carcinoma treated with hysterectomy followed by adjuvant chemo and vaginal brachytherapy. She then had a recurrence diagnosed in late 2012 that was deemed not to be surgically resectable, and was treated with 6 cycles of carbo/taxol followed by 5040 cGy of EBRT to the pelvic mass. She was then followed with serial imaging and was noted in June of this year to have slight increase in the size of the mass, and again in September there was small enlargement of the mass. She had  a re-staging PET scan on 03/10/13 that showed FDG activity in the pelvic mass, with no other areas of disease      12/01/2007 Imaging    CT scan of chest, abdomen and pelvis: 1.  Mass like area of decreased attenuation in the central uterus, consistent with the known endometrial carcinoma.  Deep myometrial invasion is suspected.  Pelvic MRI without and with contrast could be performed for further staging workup if clinically warranted. 2.  No evidence of extrauterine extension or lymphadenopathy. 3.  Sigmoid diverticulosis incidentally noted.      12/15/2007 Surgery    --12/2007 laparoscopic hysterectomy and PLND --05/2008 complted 6 cycles adjuvant carbo/taxol followed by vaginal cuff brachy --03/2011 exploratory lararoscopy, ureterolysis --4/13 completed 6  cycels carbotaxol --8/13 completed EBRT to the left pelvic sidewall       06/10/2008 Imaging    CT scan abdomen and pelvis 1.  Nonspecific mildly prominent inguinal lymph nodes, right greater than left. 2.  Interval hysterectomy without evidence of recurrent pelvic mass or fluid collection.      03/22/2011 Imaging    Ct scan abdomen and pelvis 1.  Local uterine cancer recurrence with two large nodes along the left pelvic sidewall. 2.  No evidence of bowel obstruction, urinary obstruction, or more distant metastasis.      03/31/2011 Relapse/Recurrence    chemotherapy and external beam radiation      05/16/2011 - 09/17/2011 Chemotherapy    She had 6 cycles of carboplatin and Taxol       07/17/2011 Imaging    Ct scan abdomen and pelvis 1.  Interval improvement in the previously demonstrated left pelvic local recurrence of tumor. 2.  No disease progression or complication identified. 3.  Mild bladder wall thickening on the right, nonspecific and possibly related to incomplete distension and/or radiation therapy. 4.  Cholelithiasis      10/11/2011 Imaging    CT scan abdomen and pelvis 1.  Interval enlargement of the left pelvic sidewall masses. 2.  No new nodular disease in the pelvis or lymphadenopathy. 3.  No evidence  of distant metastasis. 4.  Mild hydronephrosis on the right is similar to prior. 5.  Focal thickening of the right aspect the bladder is stable compared to prior.       10/28/2011 - 12/05/2011 Radiation Therapy    radiation for pelvic sidewall recurrence      10/28/2011 - 12/05/2011 Radiation Therapy    10/28/11-12/05/11: Radiotherapy to the left pelvic sidewall      03/18/2012 Imaging    CT abdomen 1.  Today's study demonstrates a positive response to therapy with decreased size of left pelvic sidewall nodal masses, as detailed above.  No new soft tissue masses or new lymphadenopathy is identified within the abdomen or pelvis. Continue attention on follow-up  studies is recommended. 2.  Cholelithiasis without findings to suggest acute cholecystitis. 3.  Colonic diverticulosis without findings to suggest acute diverticulitis at this time. 4.  Mild asymmetric urinary bladder wall thickening is unchanged compared to prior examinations and is nonspecific.  No definite bladder wall mass is identified at this time. 5.  Additional findings, similar to prior examinations, as above      06/22/2012 Imaging    CT abdomen 1.  Further decreased size of the left pelvic peripherally enhancing lesion. 2.  No new sites of new or progressive disease. 3. Esophageal air fluid level suggests dysmotility or gastroesophageal reflux. 4.  Cholelithiasis      11/02/2012 Imaging  CT abdomen 1.  The left pelvic side wall lesion demonstrates mild increase in size from previous exam. 2.  No new sites of new or progressive disease. 3.  Cholelithiasis.       01/28/2013 Imaging    CT abdomen 1. Interval small increase in volume of enhancing mass adjacent to the left pelvic sidewall. 2. No evidence of abdominal or pelvic lymphadenopathy      04/02/2013 Surgery    pelvic mass resection with IORT      04/02/2013 - 04/02/2013 Radiation Therapy    04/02/13: Intraoperative radiotherapy to the left pelvis      05/10/2013 PET scan    1. Hypermetabolic mass in the deep left pelvis consistent with metastasis. 2. No additional evidence of local metastasis. No evidence of distant metastasis. 3. Small right lower lobe pulmonary nodule is not hypermetabolic.      07/27/2013 Imaging    No evidence of recurrent or metastatic disease. Apparent prior resection of the deep left pelvic metastasis.      02/08/2014 Imaging    No CT findings for recurrent or metastatic disease involving the abdomen/ pelvis      06/13/2014 Relapse/Recurrence    + recurrence paraspinous muscle      06/21/2014 Procedure    There is a soft tissue lesion along the left side of L3-L4. This lesion  roughly measures up to 2.2 cm. Needle was positioned along the posterior aspect of the lesion. Small amount of air in the paraspinal tissues following needle removal      06/21/2014 Pathology Results    Bone, biopsy, left lumbar paraspinal - METASTATIC POORLY DIFFERENTIATED CARCINOMA, CONSISTENT WITH HIGH GRADE SEROUS CARCINOMA SEE COMMENT. Microscopic Comment The carcinoma demonstrates the following immunophenotype: Cytokeratin 7 - patchy moderate to strong expression Estrogen receptor - negative expression P53 - strong diffuse expression TTF-1 - negative expression WT-1 - negative expression CD56 - focal moderate strong expression Synaptophysin - negative expression GCDFP - negative expression The history of primary endometrial papillary serous carcinoma and primary mammary carcinoma is noted. In the current case, the overall morphologic and immunophenotype are that of poorly differentiated carcinoma, consistent with high grade serous carcinoma.       07/11/2014 - 08/12/2014 Radiation Therapy    07/11/14-08/12/14: 55 Gy in 25 fractions to the Lumbar spine      08/15/2014 Imaging    CT scan of chest, abdomen and pelvis 1. Since the biopsy study of 06/21/2014, similar to slight decrease in size of a left paraspinous lesion at L3-4. 2. No new sites of metastatic disease identified. 3. 8 mm ground-glass nodule in the right lower lobe is grossly similar to 03/10/2013, suggesting a benign etiology. 4. Cholelithiasis. 5. Apparent sigmoid colonic wall thickening which could be due to underdistention. Colitis felt less likely. 6.  Atherosclerosis, including within the coronary arteries. 7. Pelvic floor laxity.      10/07/2014 Imaging    CT scan of chest, abdomen and pelvis: Stable to slight interval decrease in size of left paraspinous lesion at L3-4. No new sites of metastatic disease. Unchanged ground-glass nodule within the right lower lobe, potentially benign in etiology. Recommend attention  on followup. Cholelithiasis. Sigmoid colonic diverticulosis. No CT evidence to suggest acute diverticulitis.      01/19/2015 Imaging    Ct scan of abdomen and pelvis 1. Decrease in size of left paraspinous metastasis. 2. No new sites of disease. 3. Stable ground-glass attenuating nodule within the superior segment of right lower lobe. 4. Gallstones  05/02/2015 Imaging    Ct abdomen and pelvis: Slight interval increase in size of left lumbar paraspinal soft tissue metastasis. No new sites of metastatic disease identified within the abdomen or pelvis.       05/11/2015 PET scan    1. The small soft tissue mass just lateral to the left L3 for neural foramen is hypermetabolic, favoring malignancy. 2. There is also a small focus of hypermetabolic activity between the left L1 and L2 transverse processes, but without a CT correlate. 3. The 13 mm sub solid nodule in the superior segment right lower lobe is stable from recent exams but has slowly increased in size over the last 7 years. Although not hypermetabolic, the appearance is concerning for the possibility of a low grade adenocarcinoma. 4. Other imaging findings of potential clinical significance: Chronic bilateral maxillary sinusitis. Coronary, aortic arch, and branch vessel atherosclerotic vascular disease. Aortoiliac atherosclerotic vascular disease. Cholelithiasis. Sigmoid colon diverticulosis. Lumbar scoliosis.      07/11/2015 Imaging    Ct scan of chest, abdomen and pelvis: 1. 13 mm mixed solid and sub solid nodule in the posterior right lower lobe was present in 2009 and has clearly progressed in the interval since that study. Imaging features remain highly concerning for low-grade or well differentiated adenocarcinoma. 2. Slight increase size of in the hypermetabolic, rim enhancing nodule identified adjacent to the left L3-4 neural foramen. Metastatic disease remains a concern. 3. No evidence for discrete soft tissue lesion between  the left L1 and 2 transverse processes, the site of focal FDG uptake on the recent PET-CT. 4. Cholelithiasis. 5. Abdominal aortic atherosclerosis      08/03/2015 - 10/05/2015 Chemotherapy    She received weekly carboplatin and Taxol      11/06/2015 PET scan    1. Hypermetabolic left L3-4 paraspinous metastasis (biopsy-proven) is stable in size and slightly decreased in metabolism. 2. Previously described small focus of hypermetabolism between the left L2 and L3 spinous processes has resolved. 3. New mild linear hypermetabolism to the left of the T11-12 spinous processes without discrete mass on the CT images, favor benign activity related activity, recommend attention on follow-up PET-CT.  4. No definite new sites of hypermetabolic metastatic disease. No recurrent hypermetabolic metastatic disease in the pelvis. 5. Interval stability of subsolid 1.3 cm superior segment right lower lobe pulmonary nodule without associated significant metabolism, which has grown compared to the 2009 chest CT study, and remain suspicious for low grade adenocarcinoma. 6. Additional findings include aortic atherosclerosis, coronary atherosclerosis, mild multinodular goiter with no hypermetabolic thyroid nodules, cholelithiasis and moderate sigmoid diverticulosis.      02/12/2016 PET scan    1. Interval increase in size and metabolic activity of the LEFT paraspinal soft tissue metastasis. 2. New activity within the musculature of the LEFT chest wall. Given the unusual location of the paraspinal metastasis cannot exclude a second soft tissue metastasis to the musculature however favor benign physiologic activity. 3. Stable RIGHT lower lobe pulmonary nodule. 4. No evidence of local recurrence.        04/08/2016 Imaging    Ct scan of chest, abdomen and pelvis: 1. Similar size of a  left paravertebral abdominal lesion. 2. No new or progressive metastatic disease. 3. No correlate for the muscular activity about the  lateral left chest wall. 4.  Coronary artery atherosclerosis. Aortic atherosclerosis. 5. Cholelithiasis. 6. Similar right lower lobe pulmonary nodule.      06/03/2016 Imaging    CT abdomen and pelvis 1. Similar to mild  enlargement of a paravertebral soft tissue lesion at the L3-4 level. 2. No new sites of disease identified. 3. Cholelithiasis. 4. Hysterectomy. 5.  Aortic atherosclerosis.       09/02/2016 Imaging    CT abdomen and pelvis 1. Continue mild interval increase in size of LEFT paraspinal mass (1-2 mm). 2. No evidence of new metastatic disease in the abdomen pelvis. 3. Post hysterectomy anatomy      09/26/2016 Imaging    MR lumbar spine 1. Left paraspinal metastasis with epicenter adjacent to the left L3 neural foramen does extend into the foramen to the level of the left lateral epidural space (series 9, image 31), but also tracks cephalad and caudal along the L2-L4 lumbar plexus (series 10, image 15). No extension into the left L2 or L4 neural foramina. And no more distal extension of tumor. 2. Abnormal signal in the medial left psoas and ventral left erector spinae muscles at L2 and L3 is probably denervation related. 3. No bone invasion or osseous metastatic disease. Suspect previous radiation of the L2 through L5 spinal levels. 4. No dural or intradural metastatic disease.       10/22/2016 Procedure    She underwent stereotactic Radiosurgery      10/30/2016 Genetic Testing    Patient has genetic testing done for Inheritable genetic mutation panel. Results revealed patient has no actionable mutation.      01/21/2017 Imaging    1. Reduced size and conspicuity of the left paraspinal mass at the L3-4 level. The mass still present but has enhancement similar to that of adjacent psoas musculature. 2. No new metastatic disease is identified. 3. Other imaging findings of potential clinical significance: Cholelithiasis. Aortic Atherosclerosis (ICD10-I70.0). Sigmoid  diverticulosis. Lumbar scoliosis.       Metastatic cancer to bone ([place])   06/27/2014 Initial Diagnosis    Metastatic cancer to bone       INTERVAL HISTORY: Please see below for problem oriented charting. She returns with her husband and daughter for further follow-up She denies new back pain No worsening peripheral neuropathy Appetite is stable, no recent weight loss She complain of intermittent abdominal bloating without changes in bowel habits No vaginal bleeding She denies any recent abnormal breast examination, palpable mass, abnormal breast appearance or nipple changes  REVIEW OF SYSTEMS:   Constitutional: Denies fevers, chills or abnormal weight loss Eyes: Denies blurriness of vision Ears, nose, mouth, throat, and face: Denies mucositis or sore throat Respiratory: Denies cough, dyspnea or wheezes Cardiovascular: Denies palpitation, chest discomfort or lower extremity swelling Gastrointestinal:  Denies nausea, heartburn or change in bowel habits Skin: Denies abnormal skin rashes Lymphatics: Denies new lymphadenopathy or easy bruising Neurological:Denies numbness, tingling or new weaknesses Behavioral/Psych: Mood is stable, no new changes  All other systems were reviewed with the patient and are negative.  I have reviewed the past medical history, past surgical history, social history and family history with the patient and they are unchanged from previous note.  ALLERGIES:  is allergic to ace inhibitors; codeine; demerol; and dilaudid [hydromorphone hcl].  MEDICATIONS:  Current Outpatient Prescriptions  Medication Sig Dispense Refill  . amLODipine (NORVASC) 5 MG tablet Take 5 mg by mouth daily.    Marland Kitchen aspirin EC 81 MG tablet Take 81 mg by mouth daily.    Marland Kitchen atenolol (TENORMIN) 25 MG tablet Take 25 mg by mouth 2 (two) times daily.     . Biotin 5000 MCG TABS Take 5,000 mcg by mouth daily.    . cholecalciferol (VITAMIN  D) 1000 UNITS tablet Take 1,000 Units by mouth every  Monday, Wednesday, and Friday.    . Cyanocobalamin (VITAMIN B-12) 5000 MCG SUBL Place 1 tablet under the tongue daily.     Marland Kitchen ibuprofen (ADVIL,MOTRIN) 200 MG tablet Take 200-400 mg by mouth 2 (two) times daily as needed. Reported on 11/13/2015    . lidocaine-prilocaine (EMLA) cream Apply to [facility] cath 1-2 hours prior to access as directed 30 g 1  . LORazepam (ATIVAN) 0.5 MG tablet 1 tablet po 30 minutes prior to radiation or MRI (Patient not taking: Reported on 10/24/2016) 30 tablet 0  . Magnesium 250 MG TABS Take 250 mg by mouth every Monday, Wednesday, and Friday.     . Multiple Vitamin (MULTIVITAMIN WITH MINERALS) TABS tablet Take 1 tablet by mouth daily.    . naproxen (NAPROSYN) 500 MG tablet Take 1 tablet (500 mg total) by mouth daily as needed. (Patient not taking: Reported on 09/04/2016) 60 tablet 1  . Omega-3 Fatty Acids (FISH OIL PO) Take 360 mg by mouth daily.    . potassium gluconate 595 MG TABS tablet Take 595 mg by mouth every Monday, Wednesday, and Friday.    . simvastatin (ZOCOR) 20 MG tablet Take 20 mg by mouth daily.     No current facility-administered medications for this visit.     PHYSICAL EXAMINATION: ECOG PERFORMANCE STATUS: 1 - Symptomatic but completely ambulatory  Vitals:   01/24/17 1332  BP: (!) 162/68  Pulse: 78  Resp: 18  Temp: 98.4 F (36.9 C)  SpO2: 100%   Filed Weights   01/24/17 1332  Weight: 109 lb 14.4 oz (49.9 kg)    GENERAL:alert, no distress and comfortable SKIN: skin color, texture, turgor are normal, no rashes or significant lesions EYES: normal, Conjunctiva are pink and non-injected, sclera clear Musculoskeletal:no cyanosis of digits and no clubbing  NEURO: alert & oriented x 3 with fluent speech, no focal motor/sensory deficits  LABORATORY DATA:  I have reviewed the data as listed    Component Value Date/Time   NA 141 01/21/2017 0847   K 3.6 01/21/2017 0847   CL 104 09/13/2015 0645   CO2 27 01/21/2017 0847   GLUCOSE 79 01/21/2017 0847    BUN 19.2 01/21/2017 0847   CREATININE 0.8 01/21/2017 0847   CALCIUM 10.3 01/21/2017 0847   PROT 7.7 01/21/2017 0847   ALBUMIN 3.9 01/21/2017 0847   AST 31 01/21/2017 0847   ALT 17 01/21/2017 0847   ALKPHOS 53 01/21/2017 0847   BILITOT 0.79 01/21/2017 0847   GFRNONAA >60 09/13/2015 0645   GFRAA >60 09/13/2015 0645    No results found for: SPEP, UPEP  Lab Results  Component Value Date   WBC 5.2 01/21/2017   NEUTROABS 2.7 01/21/2017   HGB 12.3 01/21/2017   HCT 37.6 01/21/2017   MCV 98.4 01/21/2017   PLT 176 01/21/2017      Chemistry      Component Value Date/Time   NA 141 01/21/2017 0847   K 3.6 01/21/2017 0847   CL 104 09/13/2015 0645   CO2 27 01/21/2017 0847   BUN 19.2 01/21/2017 0847   CREATININE 0.8 01/21/2017 0847      Component Value Date/Time   CALCIUM 10.3 01/21/2017 0847   ALKPHOS 53 01/21/2017 0847   AST 31 01/21/2017 0847   ALT 17 01/21/2017 0847   BILITOT 0.79 01/21/2017 0847       RADIOGRAPHIC STUDIES: I have reviewed imaging study with the patient and family I have  personally reviewed the radiological images as listed and agreed with the findings in the report. Mr Lumbar Spine W Wo Contrast  Result Date: 01/23/2017 CLINICAL DATA:  Metastatic endometrial cancer. Radiation to the lumbar spine. Also history of breast cancer EXAM: MRI LUMBAR SPINE WITHOUT AND WITH CONTRAST TECHNIQUE: Multiplanar and multiecho pulse sequences of the lumbar spine were obtained without and with intravenous contrast. CONTRAST:  9mL MULTIHANCE GADOBENATE DIMEGLUMINE 529 MG/ML IV SOLN COMPARISON:  Lumbar MRI 09/26/2016 FINDINGS: Segmentation:  Normal Alignment: 6 mm anterolisthesis at L4-5 similar to the prior study. Moderate levoscoliosis at L1-2. Vertebrae: Radiation changes in the bone marrow of L2 through L5 as noted previously. Degenerative endplate changes on the right at L1-2 related to scoliosis. No bone marrow metastatic lesions identified. Conus medullaris: Extends to  the L1-2 Level and appears normal. Paraspinal and other soft tissues: Enhancing mass lesion to the left of the L3-4 neural foramina has improved. There is now a central nonenhancing component measuring 10 x 15 mm likely tumor necrosis. There is edema in the surrounding left psoas muscle. No invasion of the spinal canal or bone. No other paraspinous soft tissue masses. Disc levels: L1-2: Moderate disc degeneration and spurring on the right with right foraminal encroachment L2-3:  Disc degeneration and spurring without significant stenosis. L3-4: Disc bulging and spurring. Mild spinal stenosis. Bilateral facet hypertrophy. Left foramen appears widened due to prior tumor growth. L4-5: 6 mm anterolisthesis. Disc and facet degeneration. Moderate spinal stenosis. Subarticular and foraminal stenosis on the left. L5-S1:  Mild facet degeneration without stenosis IMPRESSION: Interval improvement in soft tissue mass lateral to the L3-4 foramen on the left following treatment. Central nonenhancing area measures 10 x 15 mm compatible with necrosis with surrounding edema in the muscle. No new areas of metastatic disease Scoliosis and multilevel degenerative changes similar to the prior study. Electronically Signed   By: Marlan Palau M.D.   On: 01/23/2017 15:04   Ct Abdomen Pelvis W Contrast  Result Date: 01/21/2017 CLINICAL DATA:  Endometrial cancer metastatic disease to L3-4. History of right breast cancer. Completed radiation therapy and chemotherapy. Restaging assessment. EXAM: CT ABDOMEN AND PELVIS WITH CONTRAST TECHNIQUE: Multidetector CT imaging of the abdomen and pelvis was performed using the standard protocol following bolus administration of intravenous contrast. CONTRAST:  ISOVUE-300 IOPAMIDOL (ISOVUE-300) INJECTION 61% COMPARISON:  09/02/2016 and 09/26/2016 MRI FINDINGS: Lower chest: Mild scarring in the right middle lobe and left lower lobe, stable. Hepatobiliary: Densities throughout the gallbladder  favor gallstones. No significant focal liver lesion identified. Pancreas: Unremarkable Spleen: Unremarkable Adrenals/Urinary Tract: Unremarkable Stomach/Bowel: Sigmoid diverticulosis without active diverticulitis identified. Vascular/Lymphatic: Aortoiliac atherosclerotic vascular disease. No pathologic adenopathy identified. Reproductive: Uterus absent.  No adnexal mass. Other: No supplemental non-categorized findings. Musculoskeletal: The left paraspinal mass at the L3-4 level measures 2.3 by 1.7 cm on image 28/2 (previously measured at 2.1 by 2.7 cm) and has reduced size and enhancement compared to the prior exam. The lesion currently is only slightly hyperdense to the adjacent muscle and [place] has lower conspicuity. Levoconvex thoracolumbar scoliosis and dextroconvex lower lumbar scoliosis. 6 mm degenerative anterolisthesis at L4-5. 3 mm degenerative retrolisthesis at L3-4. IMPRESSION: 1. Reduced size and conspicuity of the left paraspinal mass at the L3-4 level. The mass still present but has enhancement similar to that of adjacent psoas musculature. 2. No new metastatic disease is identified. 3. Other imaging findings of potential clinical significance: Cholelithiasis. Aortic Atherosclerosis (ICD10-I70.0). Sigmoid diverticulosis. Lumbar scoliosis. Electronically Signed   By: Gaylyn Rong  M.D.   On: 01/21/2017 14:21    ASSESSMENT & PLAN:  Endometrial cancer ([place]) The patient had long-standing history of recurrent endometrial cancer Genetic testing is negative She has completed radiation therapy with no signs of cancer I recommend port flushes every 6-8 weeks Plan to see her back in 3 months with repeat CT scan to assess response to treatment  History of breast cancer She has remote history of breast cancer She will continue screening mammogram  Chemotherapy-induced neuropathy ([place]) She has trace residual peripheral neuropathy at the tips of fingers and feet It does not bother her Continue  close observation  Essential hypertension She takes medications at home for chronic hypertension She have significant whitecoat hypertension today Her blood pressure monitoring at home is typically within normal range I do not feel strongly we need dose adjustment  Metastatic cancer to bone [facility]) She has no signs of cancer recurrence I recommend calcium with vitamin D supplement to prevent severe osteoporosis   No orders of the defined types were placed in this encounter.  All questions were answered. The patient knows to call the clinic with any problems, questions or concerns. No barriers to learning was detected. I spent 20 minutes counseling the patient face to face. The total time spent in the appointment was 30 minutes and more than 50% was on counseling and review of test results     Heath Lark, MD 01/24/2017 3:29 PM

## 2017-01-24 NOTE — Telephone Encounter (Signed)
Gave avs and calendar for November and december

## 2017-01-24 NOTE — Assessment & Plan Note (Signed)
The patient had long-standing history of recurrent endometrial cancer Genetic testing is negative She has completed radiation therapy with no signs of cancer I recommend port flushes every 6-8 weeks Plan to see her back in 3 months with repeat CT scan to assess response to treatment

## 2017-01-24 NOTE — Assessment & Plan Note (Signed)
She takes medications at home for chronic hypertension She have significant whitecoat hypertension today Her blood pressure monitoring at home is typically within normal range I do not feel strongly we need dose adjustment

## 2017-01-24 NOTE — Assessment & Plan Note (Signed)
She has no signs of cancer recurrence I recommend calcium with vitamin D supplement to prevent severe osteoporosis

## 2017-01-24 NOTE — Assessment & Plan Note (Signed)
She has remote history of breast cancer She will continue screening mammogram

## 2017-01-27 ENCOUNTER — Ambulatory Visit
Admission: RE | Admit: 2017-01-27 | Discharge: 2017-01-27 | Disposition: A | Discharge status: Home / Self Care | Payer: Medicare Other | Source: Ambulatory Visit | Attending: Radiation Oncology | Admitting: Radiation Oncology

## 2017-01-27 ENCOUNTER — Other Ambulatory Visit: Payer: Self-pay | Admitting: Hematology and Oncology

## 2017-01-27 ENCOUNTER — Encounter: Payer: Self-pay | Admitting: Radiation Oncology

## 2017-01-27 VITALS — BP 131/76 | HR 74 | Temp 98.1°F | Resp 16 | Ht 59.0 in | Wt 109.8 lb

## 2017-01-27 DIAGNOSIS — C541 Malignant neoplasm of endometrium: Secondary | ICD-10-CM

## 2017-01-27 DIAGNOSIS — I10 Essential (primary) hypertension: Secondary | ICD-10-CM | POA: Diagnosis not present

## 2017-01-27 DIAGNOSIS — R200 Anesthesia of skin: Secondary | ICD-10-CM | POA: Diagnosis not present

## 2017-01-27 DIAGNOSIS — Z08 Encounter for follow-up examination after completed treatment for malignant neoplasm: Secondary | ICD-10-CM | POA: Diagnosis not present

## 2017-01-27 DIAGNOSIS — F419 Anxiety disorder, unspecified: Secondary | ICD-10-CM | POA: Diagnosis not present

## 2017-01-27 DIAGNOSIS — C7951 Secondary malignant neoplasm of bone: Secondary | ICD-10-CM | POA: Diagnosis not present

## 2017-01-27 DIAGNOSIS — Z853 Personal history of malignant neoplasm of breast: Secondary | ICD-10-CM | POA: Diagnosis not present

## 2017-01-27 DIAGNOSIS — Z8521 Personal history of malignant neoplasm of larynx: Secondary | ICD-10-CM | POA: Diagnosis not present

## 2017-01-27 DIAGNOSIS — R590 Localized enlarged lymph nodes: Secondary | ICD-10-CM | POA: Diagnosis not present

## 2017-01-27 DIAGNOSIS — C549 Malignant neoplasm of corpus uteri, unspecified: Secondary | ICD-10-CM

## 2017-01-27 DIAGNOSIS — Z85830 Personal history of malignant neoplasm of bone: Secondary | ICD-10-CM | POA: Diagnosis not present

## 2017-01-27 DIAGNOSIS — C775 Secondary and unspecified malignant neoplasm of intrapelvic lymph nodes: Secondary | ICD-10-CM

## 2017-01-30 ENCOUNTER — Telehealth: Payer: Self-pay | Admitting: Hematology and Oncology

## 2017-01-30 NOTE — Telephone Encounter (Signed)
Scheduled appt per 9/17 sch message patient is aware of appt date and time.

## 2017-01-30 NOTE — Progress Notes (Signed)
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     Interval Since Last Radiation: 3 months  10/22/2016 to 10/28/2016:  The L-spine was treated to 27 Gy in 3 fractions at 9 Gy per fraction.   07/11/14-08/12/14: 55 Gy in 25 fractions to the Lumbar spine  04/02/13: Intraoperative radiotherapy to the left pelvis.  10/28/11-12/05/11: Radiotherapy to the left pelvic sidewall, details not available at time of dictation  2010: Vaginal cuff brachytherapy, details of her dose are not available at time of dictation.  2000: Adjuvant radiotherapy to the right breast, details of her dose are not available at time dictation  Narrative:   [name] is a 76 y.o. 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 The patient has a history of Stage I, T1N0, ER/PR positive, HER2 2+ ductal right breast cancer in 2000 which was treated with lumpectomy, sentinel lymph node assessment and tamoxifen. She was diagnosed with stage IB papillary serous endometrial carcinoma in August 2009, underwent radical hysterectomy with 15 negative lymph nodes however LVSI was noted. She completed 6 cycles of Taxol carboplatin in January 2010, followed by a vaginal cuff brachytherapy. Her uterine cancer recurred in the fall of 2012 in the pelvic sidewall. She continued chemotherapy, and proceeded with radiotherapy in 2013 to the site. She also developed a new mass in the pelvis, she subsequently underwent radical resection at [facility] in November 2014 with resection of pelvic mass, left ureteral lysis, intraoperative radiotherapy, omental J flap and left ureteral stent  placement. She did well until 2016 when she had lumbar pain and an MRI in an orthopedic office revealed a 2.7 x 2.4 x 1.9 cm mass at the L3-L4 location with left neural foramen involvement, and focal destruction of L3. A CT guided biopsy confirmed high grade serous carcinoma consistent with her uterine cancer. She received Radiotherapy to the lumbar spine, and began Zometa along with her systemic therapy. She had new pulmonary disease identified in the right lower lobe in January 2017, this was stabilized with chemotherapy, and her paraspinal mass was also relatively stable until her last scan on 09/02/16 measured this at 27 x 21 mm, previously 25 x 18 mm in January 2018. She does not have any other surgical options available, and comes today to discuss SRS to the paraspinal mass as a form of salvage therapy. Her last course of dose dense taxol/carboplatin chemotherapy was administered in September 2017  She underwent SRS to the paraspinal tumor in three fractions which she completed in June 2018. She underwent her first post treatment surveillance MRI which shows improvement, and what appeared to be tumor necrosis, with mild edema of the psoas. She comes today to review these findings.                        On review of systems, the patient reports that she is doing well overall. She denies any chest pain, shortness of breath, cough, fevers, chills, night sweats, unintended weight changes. She denies any bowel or bladder disturbances, and denies abdominal pain, nausea or vomiting. She denies any new musculoskeletal or joint aches or  pains, new skin lesions or concerns. She continues to note numbness in her left knee for several months, and reports that this is not painful nor does it radiate down her leg or buttock.  She denies any progressive neuropathic symptoms. A complete review of systems is obtained and is otherwise negative.   Past Medical History:  Past Medical History:  Diagnosis Date  .  Endometrial cancer ([place]) 12/2007   s/p total abdominal hysterectomy at [facility] - ovaries were also removed   . Family history of breast cancer   . History of breast cancer    right breast -- treated with lumpectomy and radiation therarpy, postoperatively with tamoxifen   . History of radiation therapy 10/22/2016 to 10/28/2016  . Hypercholesterolemia   . Hyperlipidemia   . Hypertension   . Palpitations   . PVC's (premature ventricular contractions)   . Radiation 07/11/14-08/12/14   left lumbar paraspinal area 55 gray  . S/P radiation therapy 10/28/11-12/05/11   5040 cGy left pelvis  . S/P radiation therapy    Intracavitary brachytherapy of uterus    Past Surgical History: Past Surgical History:  Procedure Laterality Date  . APPENDECTOMY    . BREAST LUMPECTOMY    . FOOT SURGERY     RIGHT  . TONSILLECTOMY    . TOTAL ABDOMINAL HYSTERECTOMY W/ BILATERAL SALPINGOOPHORECTOMY      Social History:  Social History   Social History  . Marital status: Married    Spouse name: [name]  . Number of children: 2  . Years of education: N/A   Occupational History  . retired    Social History Main Topics  . Smoking status: Former Smoker    Types: Cigarettes    Quit date: 05/13/1962  . Smokeless tobacco: Never Used  . Alcohol use Yes     Comment: ocass  . Drug use: No  . Sexual activity: No   Other Topics Concern  . Not on file   Social History Narrative  . 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  Family History: Family History  Problem Relation Age of Onset  . Thrombosis Father        coronary thrombosis  . Hypertension Father   . Heart failure Mother   . Coronary artery disease Brother   . Breast cancer Maternal Aunt        dx in her 60s  . Stroke Maternal Uncle   . Stroke Maternal Grandfather      ALLERGIES:  is allergic to ace inhibitors; codeine;  demerol; and dilaudid [hydromorphone hcl].  Meds: Current Outpatient Prescriptions  Medication Sig Dispense Refill  . amLODipine (NORVASC) 5 MG tablet Take 5 mg by mouth daily.    Marland Kitchen aspirin EC 81 MG tablet Take 81 mg by mouth daily.    Marland Kitchen atenolol (TENORMIN) 25 MG tablet Take 25 mg by mouth 2 (two) times daily.     . Biotin 5000 MCG TABS Take 5,000 mcg by mouth daily.    . cholecalciferol (VITAMIN D) 1000 UNITS tablet Take 1,000 Units by mouth every Monday, Wednesday, and Friday.    . Cyanocobalamin (VITAMIN B-12) 5000 MCG SUBL Place 1 tablet under the tongue daily.     Marland Kitchen ibuprofen (ADVIL,MOTRIN) 200 MG tablet Take 200-400 mg by mouth 2 (two) times daily as needed. Reported on 11/13/2015    . lidocaine-prilocaine (EMLA) cream Apply to  Porta cath 1-2 hours prior to access as directed 30 g 1  . LORazepam (ATIVAN) 0.5 MG tablet 1 tablet po 30 minutes prior to radiation or MRI 30 tablet 0  . Magnesium 250 MG TABS Take 250 mg by mouth every Monday, Wednesday, and Friday.     . Multiple Vitamin (MULTIVITAMIN WITH MINERALS) TABS tablet Take 1 tablet by mouth daily.    . Omega-3 Fatty Acids (FISH OIL PO) Take 360 mg by mouth daily.    . potassium gluconate 595 MG TABS tablet Take 595 mg by mouth every Monday, Wednesday, and Friday.    . simvastatin (ZOCOR) 20 MG tablet Take 20 mg by mouth daily.    . naproxen (NAPROSYN) 500 MG tablet Take 1 tablet (500 mg total) by mouth daily as needed. (Patient not taking: Reported on 09/04/2016) 60 tablet 1   No current facility-administered medications for this encounter.     Physical Findings:  height is '4\' 11"'$  (1.499 m) and weight is 109 lb 12.8 oz (49.8 kg). Her oral temperature is 98.1 F (36.7 C). Her blood pressure is 131/76 and her pulse is 74. Her respiration is 16 and oxygen saturation is 99%.   /10 In general this is a well appearing caucasian female in no acute distress. She's alert and oriented x4 and appropriate throughout the examination.  Cardiopulmonary assessment is negative for acute distress and she exhibits normal effort. The skin on her posterior thorax is intact without desquamation or hyperpigmentation. Her left knee is negative for edema.   Lab Findings: Lab Results  Component Value Date   WBC 5.2 01/21/2017   HGB 12.3 01/21/2017   HCT 37.6 01/21/2017   MCV 98.4 01/21/2017   PLT 176 01/21/2017     Radiographic Findings: Mr Lumbar Spine W Wo Contrast  Result Date: 01/23/2017 CLINICAL DATA:  Metastatic endometrial cancer. Radiation to the lumbar spine. Also history of breast cancer EXAM: MRI LUMBAR SPINE WITHOUT AND WITH CONTRAST TECHNIQUE: Multiplanar and multiecho pulse sequences of the lumbar spine were obtained without and with intravenous contrast. CONTRAST:  78m MULTIHANCE GADOBENATE DIMEGLUMINE 529 MG/ML IV SOLN COMPARISON:  Lumbar MRI 09/26/2016 FINDINGS: Segmentation:  Normal Alignment: 6 mm anterolisthesis at L4-5 similar to the prior study. Moderate levoscoliosis at L1-2. Vertebrae: Radiation changes in the bone marrow of L2 through L5 as noted previously. Degenerative endplate changes on the right at L1-2 related to scoliosis. No bone marrow metastatic lesions identified. Conus medullaris: Extends to the L1-2 Level and appears normal. Paraspinal and other soft tissues: Enhancing mass lesion to the left of the L3-4 neural foramina has improved. There is now a central nonenhancing component measuring 10 x 15 mm likely tumor necrosis. There is edema in the surrounding left psoas muscle. No invasion of the spinal canal or bone. No other paraspinous soft tissue masses. Disc levels: L1-2: Moderate disc degeneration and spurring on the right with right foraminal encroachment L2-3:  Disc degeneration and spurring without significant stenosis. L3-4: Disc bulging and spurring. Mild spinal stenosis. Bilateral facet hypertrophy. Left foramen appears widened due to prior tumor growth. L4-5: 6 mm anterolisthesis. Disc and facet  degeneration. Moderate spinal stenosis. Subarticular and foraminal stenosis on the left. L5-S1:  Mild facet degeneration without stenosis IMPRESSION: Interval improvement in soft tissue mass lateral to the L3-4 foramen on the left following treatment. Central nonenhancing area measures 10 x 15 mm compatible with necrosis with surrounding edema in the muscle. No new areas of metastatic disease Scoliosis and multilevel degenerative changes  similar to the prior study. Electronically Signed   By: Franchot Gallo M.D.   On: 01/23/2017 15:04   Ct Abdomen Pelvis W Contrast  Result Date: 01/21/2017 CLINICAL DATA:  Endometrial cancer metastatic disease to L3-4. History of right breast cancer. Completed radiation therapy and chemotherapy. Restaging assessment. EXAM: CT ABDOMEN AND PELVIS WITH CONTRAST TECHNIQUE: Multidetector CT imaging of the abdomen and pelvis was performed using the standard protocol following bolus administration of intravenous contrast. CONTRAST:  145m ISOVUE-300 IOPAMIDOL (ISOVUE-300) INJECTION 61% COMPARISON:  09/02/2016 and 09/26/2016 MRI FINDINGS: Lower chest: Mild scarring in the right middle lobe and left lower lobe, stable. Hepatobiliary: Densities throughout the gallbladder favor gallstones. No significant focal liver lesion identified. Pancreas: Unremarkable Spleen: Unremarkable Adrenals/Urinary Tract: Unremarkable Stomach/Bowel: Sigmoid diverticulosis without active diverticulitis identified. Vascular/Lymphatic: Aortoiliac atherosclerotic vascular disease. No pathologic adenopathy identified. Reproductive: Uterus absent.  No adnexal mass. Other: No supplemental non-categorized findings. Musculoskeletal: The left paraspinal mass at the L3-4 level measures 2.3 by 1.7 cm on image 28/2 (previously measured at 2.1 by 2.7 cm) and has reduced size and enhancement compared to the prior exam. The lesion currently is only slightly hyperdense to the adjacent muscle and [place] lower conspicuity.  Levoconvex thoracolumbar scoliosis and dextroconvex lower lumbar scoliosis. 6 mm degenerative anterolisthesis at L4-5. 3 mm degenerative retrolisthesis at L3-4. IMPRESSION: 1. Reduced size and conspicuity of the left paraspinal mass at the L3-4 level. The mass still present but has enhancement similar to that of adjacent psoas musculature. 2. No new metastatic disease is identified. 3. Other imaging findings of potential clinical significance: Cholelithiasis. Aortic Atherosclerosis (ICD10-I70.0). Sigmoid diverticulosis. Lumbar scoliosis. Electronically Signed   By: WVan ClinesM.D.   On: 01/21/2017 14:21    Impression/Plan: 1. 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  The patient appears to be doing very well and radiographically stable within the spine and paraspinal region, we discussed Dr. MIda Roguerecommendations for continued MRI surveillance and follow her in our brain and spine conference.we will repeat this in 3 month's time, and she is in agreement with this plan. She will contact [place] if she has questions or concerns regarding her prior treatment for discussed plan moving forward. She will also plan to follow up with Dr. GAlvy Bimlerand Dr. [place] this [place].     ACarola Rhine PAC

## 2017-02-03 DIAGNOSIS — Z23 Encounter for immunization: Secondary | ICD-10-CM | POA: Diagnosis not present

## 2017-03-14 ENCOUNTER — Ambulatory Visit (HOSPITAL_BASED_OUTPATIENT_CLINIC_OR_DEPARTMENT_OTHER): Payer: Medicare Other

## 2017-03-14 ENCOUNTER — Telehealth: Payer: Self-pay | Admitting: *Deleted

## 2017-03-14 VITALS — BP 151/63 | HR 64 | Temp 97.3°F | Resp 18

## 2017-03-14 DIAGNOSIS — C541 Malignant neoplasm of endometrium: Secondary | ICD-10-CM

## 2017-03-14 DIAGNOSIS — Z452 Encounter for adjustment and management of vascular access device: Secondary | ICD-10-CM

## 2017-03-14 DIAGNOSIS — Z95828 Presence of other vascular implants and grafts: Secondary | ICD-10-CM

## 2017-03-14 MED ORDER — SODIUM CHLORIDE 0.9% FLUSH
10.0000 mL | INTRAVENOUS | Status: DC | PRN
  Administered 2017-03-14: 10 mL via INTRAVENOUS
  Filled 2017-03-14: qty 10

## 2017-03-14 MED ORDER — HEPARIN SOD (PORK) LOCK FLUSH 100 UNIT/ML IV SOLN
500.0000 [IU] | Freq: Once | INTRAVENOUS | Status: AC
  Administered 2017-03-14: 500 [IU] via INTRAVENOUS
  Filled 2017-03-14: qty 5

## 2017-03-14 NOTE — Telephone Encounter (Signed)
Patient came in the office today to move her appts from December 12th to other day. Per patient request appts moved from December 12tht to January 2nd. Also scheduled CT scan for the patient. Patient will check my chart for updated appts and will stop by later to pick up her contrast.

## 2017-03-14 NOTE — Patient Instructions (Signed)
Implanted Port Home Guide An implanted port is a type of central line that is placed under the skin. Central lines are used to provide IV access when treatment or nutrition needs to be given through a person's veins. Implanted ports are used for long-term IV access. An implanted port may be placed because:  You need IV medicine that would be irritating to the small veins in your hands or arms.  You need long-term IV medicines, such as antibiotics.  You need IV nutrition for a long period.  You need frequent blood draws for lab tests.  You need dialysis.  Implanted ports are usually placed in the chest area, but they can also be placed in the upper arm, the abdomen, or the leg. An implanted port has two main parts:  Reservoir. The reservoir is round and will appear as a small, raised area under your skin. The reservoir is the part where a needle is inserted to give medicines or draw blood.  Catheter. The catheter is a thin, flexible tube that extends from the reservoir. The catheter is placed into a large vein. Medicine that is inserted into the reservoir goes into the catheter and then into the vein.  How will I care for my incision site? Do not get the incision site wet. Bathe or shower as directed by your health care provider. How is my port accessed? Special steps must be taken to access the port:  Before the port is accessed, a numbing cream can be placed on the skin. This helps numb the skin over the port site.  Your health care provider uses a sterile technique to access the port. ? Your health care provider must put on a mask and sterile gloves. ? The skin over your port is cleaned carefully with an antiseptic and allowed to dry. ? The port is gently pinched between sterile gloves, and a needle is inserted into the port.  Only "non-coring" port needles should be used to access the port. Once the port is accessed, a blood return should be checked. This helps ensure that the port  is in the vein and is not clogged.  If your port needs to remain accessed for a constant infusion, a clear (transparent) bandage will be placed over the needle site. The bandage and needle will need to be changed every week, or as directed by your health care provider.  Keep the bandage covering the needle clean and dry. Do not get it wet. Follow your health care provider's instructions on how to take a shower or bath while the port is accessed.  If your port does not need to stay accessed, no bandage is needed over the port.  What is flushing? Flushing helps keep the port from getting clogged. Follow your health care provider's instructions on how and when to flush the port. Ports are usually flushed with saline solution or a medicine called heparin. The need for flushing will depend on how the port is used.  If the port is used for intermittent medicines or blood draws, the port will need to be flushed: ? After medicines have been given. ? After blood has been drawn. ? As part of routine maintenance.  If a constant infusion is running, the port may not need to be flushed.  How long will my port stay implanted? The port can stay in for as long as your health care provider thinks it is needed. When it is time for the port to come out, surgery will be   done to remove it. The procedure is similar to the one performed when the port was put in. When should I seek immediate medical care? When you have an implanted port, you should seek immediate medical care if:  You notice a bad smell coming from the incision site.  You have swelling, redness, or drainage at the incision site.  You have more swelling or pain at the port site or the surrounding area.  You have a fever that is not controlled with medicine.  This information is not intended to replace advice given to you by your health care provider. Make sure you discuss any questions you have with your health care provider. Document  Released: 04/29/2005 Document Revised: 10/05/2015 Document Reviewed: 01/04/2013 Elsevier Interactive Patient Education  2017 [employer]  

## 2017-03-28 ENCOUNTER — Other Ambulatory Visit: Payer: Self-pay | Admitting: Radiation Therapy

## 2017-03-28 DIAGNOSIS — C7951 Secondary malignant neoplasm of bone: Secondary | ICD-10-CM

## 2017-04-04 DIAGNOSIS — J3089 Other allergic rhinitis: Secondary | ICD-10-CM | POA: Diagnosis not present

## 2017-04-04 DIAGNOSIS — Z6822 Body mass index (BMI) 22.0-22.9, adult: Secondary | ICD-10-CM | POA: Diagnosis not present

## 2017-04-04 DIAGNOSIS — J069 Acute upper respiratory infection, unspecified: Secondary | ICD-10-CM | POA: Diagnosis not present

## 2017-04-10 ENCOUNTER — Telehealth: Payer: Self-pay | Admitting: *Deleted

## 2017-04-10 NOTE — Telephone Encounter (Signed)
Attempted to contact the patient to move appts. Left message for the patient to call the office back tomorrow.

## 2017-04-11 ENCOUNTER — Telehealth: Payer: Self-pay | Admitting: *Deleted

## 2017-04-11 NOTE — Telephone Encounter (Signed)
Patient called back and was given the new time of her appts on January 2nd.

## 2017-04-21 ENCOUNTER — Other Ambulatory Visit: Payer: Medicare Other

## 2017-04-23 ENCOUNTER — Ambulatory Visit: Payer: Medicare Other | Admitting: Gynecologic Oncology

## 2017-04-24 ENCOUNTER — Telehealth: Payer: Self-pay | Admitting: Hematology and Oncology

## 2017-04-24 ENCOUNTER — Other Ambulatory Visit: Payer: Medicare Other

## 2017-04-24 NOTE — Telephone Encounter (Signed)
Patient called to cancel appt 12/14 - left message for patient that appt was cancelled per message left on vmail. And r/s to 12/28 prior to ct scan

## 2017-04-25 ENCOUNTER — Other Ambulatory Visit: Payer: Medicare Other

## 2017-04-28 ENCOUNTER — Telehealth: Payer: Self-pay | Admitting: *Deleted

## 2017-04-28 ENCOUNTER — Other Ambulatory Visit: Payer: Medicare Other

## 2017-04-28 NOTE — Telephone Encounter (Signed)
-----   Message from Heath Lark, MD sent at 04/28/2017  8:35 AM EST ----- Regarding: can cancel flush on 1/2 I see she has flush and labs scheduled twice: 12/28 and 1/2 You can call her and cancel the one on 1/2 ----- Message ----- From: Heath Lark, MD Sent: 01/24/2017   2:04 PM To: Heath Lark, MD

## 2017-04-28 NOTE — Telephone Encounter (Signed)
Left message with note below 

## 2017-04-30 ENCOUNTER — Ambulatory Visit: Payer: Self-pay | Admitting: Urology

## 2017-05-07 NOTE — Progress Notes (Signed)
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   Pain:Denies pain. Fatigue:Denies  Fatigue. Appetite:Reports a good appetite. Bowel/Bladder issues:No issues. Nausea:None Dizziness:No Weight: Wt Readings from Last 3 Encounters:  05/19/17 112 lb (50.8 kg)  05/14/17 112 lb 8 oz (51 kg)  01/27/17 109 lb 12.8 oz (49.8 kg)   BP 128/70 (BP Location: Right Arm, Patient Position: Sitting, Cuff Size: Normal)   Pulse 63   Temp 98.2 F (36.8 C) (Oral)   Resp 18   Ht 4\' 11"  (1.499 m)   Wt 112 lb (50.8 kg)   SpO2 100%   BMI 22.62 kg/m

## 2017-05-09 ENCOUNTER — Ambulatory Visit (HOSPITAL_BASED_OUTPATIENT_CLINIC_OR_DEPARTMENT_OTHER): Payer: Medicare Other

## 2017-05-09 ENCOUNTER — Other Ambulatory Visit (HOSPITAL_BASED_OUTPATIENT_CLINIC_OR_DEPARTMENT_OTHER): Payer: Medicare Other

## 2017-05-09 ENCOUNTER — Ambulatory Visit (HOSPITAL_COMMUNITY)
Admission: RE | Admit: 2017-05-09 | Discharge: 2017-05-09 | Disposition: A | Discharge status: Home / Self Care | Payer: Medicare Other | Source: Ambulatory Visit | Attending: Hematology and Oncology | Admitting: Hematology and Oncology

## 2017-05-09 DIAGNOSIS — I700 Atherosclerosis of aorta: Secondary | ICD-10-CM | POA: Diagnosis not present

## 2017-05-09 DIAGNOSIS — K5730 Diverticulosis of large intestine without perforation or abscess without bleeding: Secondary | ICD-10-CM | POA: Diagnosis not present

## 2017-05-09 DIAGNOSIS — D701 Agranulocytosis secondary to cancer chemotherapy: Secondary | ICD-10-CM

## 2017-05-09 DIAGNOSIS — C541 Malignant neoplasm of endometrium: Secondary | ICD-10-CM | POA: Diagnosis not present

## 2017-05-09 DIAGNOSIS — C7951 Secondary malignant neoplasm of bone: Secondary | ICD-10-CM | POA: Diagnosis not present

## 2017-05-09 DIAGNOSIS — I2510 Atherosclerotic heart disease of native coronary artery without angina pectoris: Secondary | ICD-10-CM | POA: Diagnosis not present

## 2017-05-09 DIAGNOSIS — Z95828 Presence of other vascular implants and grafts: Secondary | ICD-10-CM

## 2017-05-09 DIAGNOSIS — Z853 Personal history of malignant neoplasm of breast: Secondary | ICD-10-CM

## 2017-05-09 DIAGNOSIS — Z452 Encounter for adjustment and management of vascular access device: Secondary | ICD-10-CM | POA: Diagnosis present

## 2017-05-09 DIAGNOSIS — T451X5A Adverse effect of antineoplastic and immunosuppressive drugs, initial encounter: Secondary | ICD-10-CM

## 2017-05-09 LAB — COMPREHENSIVE METABOLIC PANEL
ALT: 17 U/L (ref 0–55)
AST: 27 U/L (ref 5–34)
Albumin: 3.6 g/dL (ref 3.5–5.0)
Alkaline Phosphatase: 43 U/L (ref 40–150)
Anion Gap: 8 mEq/L (ref 3–11)
BUN: 20.7 mg/dL (ref 7.0–26.0)
CO2: 26 mEq/L (ref 22–29)
Calcium: 9.3 mg/dL (ref 8.4–10.4)
Chloride: 105 mEq/L (ref 98–109)
Creatinine: 0.7 mg/dL (ref 0.6–1.1)
EGFR: >60 mL/min/{1.73_m2} (ref >60)
Glucose: 82 mg/dl (ref 70–140)
Potassium: 4.1 mEq/L (ref 3.5–5.1)
Sodium: 140 mEq/L (ref 136–145)
Total Bilirubin: 0.81 mg/dL (ref 0.20–1.20)
Total Protein: 6.8 g/dL (ref 6.4–8.3)

## 2017-05-09 LAB — CBC WITH DIFFERENTIAL/PLATELET
BASO%: 0.8 % (ref 0.0–2.0)
Basophils Absolute: 0 10*3/uL (ref 0.0–0.1)
EOS%: 5.6 % (ref 0.0–7.0)
Eosinophils Absolute: 0.3 10*3/uL (ref 0.0–0.5)
HCT: 36.9 % (ref 34.8–46.6)
HGB: 12.2 g/dL (ref 11.6–15.9)
LYMPH%: 21.1 % (ref 14.0–49.7)
MCH: 32.1 pg (ref 25.1–34.0)
MCHC: 33.2 g/dL (ref 31.5–36.0)
MCV: 96.7 fL (ref 79.5–101.0)
MONO#: 0.8 10*3/uL (ref 0.1–0.9)
MONO%: 16.1 % — ABNORMAL HIGH (ref 0.0–14.0)
NEUT#: 2.6 10*3/uL (ref 1.5–6.5)
NEUT%: 56.4 % (ref 38.4–76.8)
Platelets: 180 10*3/uL (ref 145–400)
RBC: 3.81 10*6/uL (ref 3.70–5.45)
RDW: 13.4 % (ref 11.2–14.5)
WBC: 4.7 10*3/uL (ref 3.9–10.3)
lymph#: 1 10*3/uL (ref 0.9–3.3)

## 2017-05-09 MED ORDER — SODIUM CHLORIDE 0.9% FLUSH
10.0000 mL | Freq: Once | INTRAVENOUS | Status: AC
  Administered 2017-05-09: 10 mL
  Filled 2017-05-09: qty 10

## 2017-05-09 MED ORDER — IOPAMIDOL (ISOVUE-300) INJECTION 61%
100.0000 mL | Freq: Once | INTRAVENOUS | Status: AC | PRN
  Administered 2017-05-09: 100 mL via INTRAVENOUS

## 2017-05-09 MED ORDER — HEPARIN SOD (PORK) LOCK FLUSH 100 UNIT/ML IV SOLN
INTRAVENOUS | Status: AC
  Filled 2017-05-09: qty 5

## 2017-05-09 MED ORDER — HEPARIN SOD (PORK) LOCK FLUSH 100 UNIT/ML IV SOLN
500.0000 [IU] | Freq: Once | INTRAVENOUS | Status: AC
  Administered 2017-05-09: 500 [IU] via INTRAVENOUS

## 2017-05-09 MED ORDER — IOPAMIDOL (ISOVUE-300) INJECTION 61%
INTRAVENOUS | Status: AC
Start: 2017-05-09 — End: 2017-05-09
  Filled 2017-05-09: qty 100

## 2017-05-09 NOTE — Patient Instructions (Signed)
Implanted Port Home Guide An implanted port is a type of central line that is placed under the skin. Central lines are used to provide IV access when treatment or nutrition needs to be given through a person's veins. Implanted ports are used for long-term IV access. An implanted port may be placed because:  You need IV medicine that would be irritating to the small veins in your hands or arms.  You need long-term IV medicines, such as antibiotics.  You need IV nutrition for a long period.  You need frequent blood draws for lab tests.  You need dialysis.  Implanted ports are usually placed in the chest area, but they can also be placed in the upper arm, the abdomen, or the leg. An implanted port has two main parts:  Reservoir. The reservoir is round and will appear as a small, raised area under your skin. The reservoir is the part where a needle is inserted to give medicines or draw blood.  Catheter. The catheter is a thin, flexible tube that extends from the reservoir. The catheter is placed into a large vein. Medicine that is inserted into the reservoir goes into the catheter and then into the vein.  How will I care for my incision site? Do not get the incision site wet. Bathe or shower as directed by your health care provider. How is my port accessed? Special steps must be taken to access the port:  Before the port is accessed, a numbing cream can be placed on the skin. This helps numb the skin over the port site.  Your health care provider uses a sterile technique to access the port. ? Your health care provider must put on a mask and sterile gloves. ? The skin over your port is cleaned carefully with an antiseptic and allowed to dry. ? The port is gently pinched between sterile gloves, and a needle is inserted into the port.  Only "non-coring" port needles should be used to access the port. Once the port is accessed, a blood return should be checked. This helps ensure that the port  is in the vein and is not clogged.  If your port needs to remain accessed for a constant infusion, a clear (transparent) bandage will be placed over the needle site. The bandage and needle will need to be changed every week, or as directed by your health care provider.  Keep the bandage covering the needle clean and dry. Do not get it wet. Follow your health care provider's instructions on how to take a shower or bath while the port is accessed.  If your port does not need to stay accessed, no bandage is needed over the port.  What is flushing? Flushing helps keep the port from getting clogged. Follow your health care provider's instructions on how and when to flush the port. Ports are usually flushed with saline solution or a medicine called heparin. The need for flushing will depend on how the port is used.  If the port is used for intermittent medicines or blood draws, the port will need to be flushed: ? After medicines have been given. ? After blood has been drawn. ? As part of routine maintenance.  If a constant infusion is running, the port may not need to be flushed.  How long will my port stay implanted? The port can stay in for as long as your health care provider thinks it is needed. When it is time for the port to come out, surgery will be   done to remove it. The procedure is similar to the one performed when the port was put in. When should I seek immediate medical care? When you have an implanted port, you should seek immediate medical care if:  You notice a bad smell coming from the incision site.  You have swelling, redness, or drainage at the incision site.  You have more swelling or pain at the port site or the surrounding area.  You have a fever that is not controlled with medicine.  This information is not intended to replace advice given to you by your health care provider. Make sure you discuss any questions you have with your health care provider. Document  Released: 04/29/2005 Document Revised: 10/05/2015 Document Reviewed: 01/04/2013 Elsevier Interactive Patient Education  2017 [employer]  

## 2017-05-12 ENCOUNTER — Telehealth: Payer: Self-pay | Admitting: Gynecologic Oncology

## 2017-05-12 ENCOUNTER — Encounter: Payer: Self-pay | Admitting: Gynecologic Oncology

## 2017-05-12 NOTE — Telephone Encounter (Signed)
Patient informed of CT scan results.  No concerns voiced.  She is to follow up on Wed as scheduled and advised to call for any needs.

## 2017-05-14 ENCOUNTER — Ambulatory Visit: Payer: Medicare Other | Attending: Gynecologic Oncology | Admitting: Gynecologic Oncology

## 2017-05-14 ENCOUNTER — Other Ambulatory Visit: Payer: Medicare Other

## 2017-05-14 ENCOUNTER — Encounter: Payer: Self-pay | Admitting: Gynecologic Oncology

## 2017-05-14 VITALS — BP 172/80 | HR 61 | Temp 98.5°F | Resp 18 | Ht 59.0 in | Wt 112.5 lb

## 2017-05-14 DIAGNOSIS — C541 Malignant neoplasm of endometrium: Secondary | ICD-10-CM | POA: Insufficient documentation

## 2017-05-14 DIAGNOSIS — Z853 Personal history of malignant neoplasm of breast: Secondary | ICD-10-CM | POA: Insufficient documentation

## 2017-05-14 DIAGNOSIS — R200 Anesthesia of skin: Secondary | ICD-10-CM | POA: Insufficient documentation

## 2017-05-14 DIAGNOSIS — Z7982 Long term (current) use of aspirin: Secondary | ICD-10-CM | POA: Insufficient documentation

## 2017-05-14 DIAGNOSIS — Z87891 Personal history of nicotine dependence: Secondary | ICD-10-CM | POA: Insufficient documentation

## 2017-05-14 DIAGNOSIS — Z90710 Acquired absence of both cervix and uterus: Secondary | ICD-10-CM | POA: Diagnosis not present

## 2017-05-14 DIAGNOSIS — Z79899 Other long term (current) drug therapy: Secondary | ICD-10-CM | POA: Insufficient documentation

## 2017-05-14 NOTE — Progress Notes (Signed)
Follow Up Note: Gyn-Onc  [name] 77 y.o. female  CC:  Chief Complaint  Patient presents with  . Endometrial cancer [facility])    HPI:  [name] is seen for continuing attention to her recurrent endometrial carcinoma, post cycle 6 chemotherapy on 5/13. Cycle 5 was delayed a week due to counts, then taxol only given on 4-16 due to platelets of only 90K that day; she did have neulasta on 08-30-2011 (then enjoyed 2 weeks at beach). She then had radiation to persistent disease on the left pelvic side wall in 8/13.  History includes microinvasive T1N0 right breast cancer, which was ER/PR positive and HER-2 2+ with insufficient material for FISH at diagnosis in 03/1999, treated with lumpectomy with 2 sentinel node evaluation, local radiation and 5 years of Tamoxifen thru 05/2004, then aromatase inhibitor 07/2004 thru 04/2006.  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 She had 6 cycles of taxol/carboplatin through Jan 2010, and vaginal cuff brachytherapy. She did well until some vague LUQ symptoms in Nov. 2012, with CT AP in [facility] system Mar 22, 2011 showing left pelvic sidewall involvement. She was taken to laparoscopic evaluation 04-25-2011, with findings of dense fibrosis in the area as well as mass encasing obturator nerve and vein as well as internal iliac vasculature; left ureterolysis was performed. No pathology was submitted from that procedure. We proceeded with initial 3 additional cycles of taxol/ carboplatin (with carbo skin tests) from 05-16-2011 thru 06-28-2011, then repeated CT AP on 07-17-2011. The CT showed improvement in the left pelvic sidewall mass now 1.9 x 2 cm and 3.4 cm cephalocaudad, as compared with 2.5 x 2.2 cm and 5.4 cm on CT 03-22-11, and no progressive disease elsewhere. She saw me after the CT, with recommendation for an additonal 3 cycles of  same chemotherapy then repeat scan. Cycle 4 was taxol + [place], with carbo skin test and neulasta, then cycle 5 as above only taxol.  She then completed radiation therapy under the care of Dr. Sondra Come which she completed in August of 2013. She had a CT scan on March 23, 2012 that revealed:   IMPRESSION:  1. Today's study demonstrates a positive response to therapy with decreased size of left pelvic sidewall nodal masses, as detailed above. No new soft tissue masses or new lymphadenopathy is identified within the abdomen or pelvis. Continue attention on follow-up studies is recommended.  2. Cholelithiasis without findings to suggest acute cholecystitis.  3. Colonic diverticulosis without findings to suggest acute diverticulitis at this time.  4. Mild asymmetric urinary bladder wall thickening is unchanged compared to prior examinations and is nonspecific. No definite bladder wall mass is identified at this time.  5. Additional findings, similar to prior examinations, as above.   There had been another lymph node identified on her CT scan in may that measured 2.1 x 1.7 cm. In reviewing her CT report it states that the nodal mass higher up along the pelvic sidewalls is no longer clearly identified although there continues to be some amorphus soft tissue in the region with the previously noted nodal mass resided. This was confirmed with the reading radiologist and communicated to the patient.   CT 06/22/12:  Normal urinary bladder. Hysterectomy. Decreased size of the left pelvic peripherally enhancing lesion. This measures 1.8 x 1.7 cm  on image 55/series 2 versus 2.4 x 2.1 cm on the prior  exam. No new lesions are identified. No significant free fluid.  Bones/Musculoskeletal: No acute osseous abnormality. Trace L4-L5 anterolisthesis is likely degenerative. There is S-shaped lumbar  spine curvature.  IMPRESSION:  1. Further decreased size of the left pelvic peripherally enhancing lesion.  2. No new  sites of new or progressive disease.  3. Esophageal air fluid level suggests dysmotility or gastroesophageal reflux.  4. Cholelithiasis.   CT 11/02/2012:  The index lesion within the left pelvic side wall measures 2.2 x 2.7 cm, image 55/series 2. Previously this measured 1.8 x 1.7 cm. No new lesions identified. The stomach and small bowel loops appear within normal limits. Normal appearance of the proximal colon. Multiple sigmoid diverticula identified without acute inflammation. Review of the visualized bony structures shows scoliosis and multilevel degenerative disc disease within the lumbar spine.  IMPRESSION:  1. The left pelvic side wall lesion demonstrates mild increase in size from previous exam.  2. No new sites of new or progressive disease.  3. Cholelithiasis.   CT 01/28/13: FINDINGS:  Lung bases are clear. No focal hepatic lesion. The gallbladder, pancreas, spleen, adrenal glands, and kidneys are normal. There are several gallstones within the lumen of the gallbladder. The stomach, small bowel, and cecum are normal. The diverticulum sigmoid colon. Rectum is normal. Abdominal aorta is normal in caliber. No retroperitoneal or periportal lymphadenopathy. No evidence of peritoneal disease. Within the pelvis, the left pelvic sidewall lobular enhancing mass which is slightly increased in size. The lesion is similar in axial dimension measuring 30 x 21 mm compared to the 29 x 22 mm on prior remeasured. In the craniocaudad dimension, lesion measures 26 mm 1ncreased from 21 mm on prior. No additional pelvic lymphadenopathy. Hysterectomy anatomy. Vaginal cuff appears normal. Bladder is normal.  Review of bone windows demonstrates no aggressive osseous lesions.  IMPRESSION:  1. Interval small increase in volume of enhancing mass adjacent to the left pelvic sidewall.  2. No evidence of abdominal or pelvic lymphadenopathy. 77 year old with recurrent uterine papillary serous carcinoma initially stage  IB diagnosed in 2009. Most recent imaging in 01/2012 revealed  interval enlargement of her left pelvic sidewall mass consistent with nodal disease.   PET 03/10/13: 1. Hypermetabolic mass in the deep left pelvis consistent with metastasis.  2. No additional evidence of local metastasis. No evidence of distant metastasis.  3. Small right lower lobe pulmonary nodule is not hypermetabolic. Recommend attention on follow-up.  She underwent resection of pelvic mass, left sided ureterolysis, intra-operative radiation therapy by radiation oncology, omental J flap, and left ureteral stent placement by urology at [facility]  on 04/02/13.  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  Final pathology resulted:  Omentum, biopsy: Mature adipose tissue, fibrous tissue, calcifications and bland appearing gland consistent with endosalpingiosis.  No diagnostic carcinoma identified, pancytokeratin confirmatory (negative).  Lymph nodes, left obturator, regional resection: Aggregate of lymph node (4.1 cm) involved by poorly differentiated high grade carcinoma (see Light Microscopy).  Abundant tumor necrosis.   She had her cystoscopy and ureteral stent removal on 05/20/2013. She had a CT scan March 17 that revealed no evidence of recurrent disease.   She had a CT scan performed on 02/05/14: IMPRESSION:  No CT findings for recurrent or metastatic disease involving the abdomen/ pelvis.  MRI lumbar spine at [facility] on 06-13-2014 showed  known scoliosis and degenerative disc disease, with new finding of 2.7 x 2.4 x 1.9 cm soft tissue mass at L3-4 involving left neural foramen, with focal cortical destruction of adjacent L3 vertebral body and portion of left L4 pedicle. She had CT biopsy on 06-21-14, with pathology ([id number]) poorly  differentiated carcinoma consistent with high grade serous. Bone scan 06-22-14 had uptake posteriorly in upper lumbar spine and a subtle increased area of uptake right greater trochanter of unclear significance. IMRT by Dr Sondra Come 2-29 thru 08-12-14. First zometa 07-07-14-6/16.  Post-XRT CT scan revealed: IMPRESSION: 1. Since the biopsy study of 06/21/2014, similar to slight decrease in size of a left paraspinous lesion at L3-4. 2. No new sites of metastatic disease identified. 3. 8 mm ground-glass nodule in the right lower lobe is grossly similar to 03/10/2013, suggesting a benign etiology. 4. Cholelithiasis. 5. Apparent sigmoid colonic wall thickening which could be due to underdistention. Colitis felt less likely. 6. Atherosclerosis, including within the coronary arteries. 7. Pelvic floor laxity.  CT 10/07/14: Musculoskeletal: Stable to slight interval decrease in size of left paraspinous lesion at the L3-4 level with peripheral enhancement measuring 1.6 x 1.7 cm (image 72; series 2), previously 2.0 x 1.8 cm.  IMPRESSION: Stable to slight interval decrease in size of left paraspinous lesion at L3-4.  No new sites of metastatic disease.  01/19/15: The left paraspinous lesion is again noted. Today this measures 11 mm, image 70/ series 2. Previously 1.7 cm. There are no new bone lesions identified.  IMPRESSION: 1. Decrease in size of left paraspinous metastasis. 2. No new sites of disease. 3. Stable ground-glass attenuating nodule within the superior segment of right lower lobe. 4. Gallstones.  PET 05/11/15: IMPRESSION: 1. The small soft tissue mass just lateral to the left L3 for neural foramen is hypermetabolic, favoring malignancy. 2. There is also a small focus of hypermetabolic activity between the left L1 and L2 transverse processes, but without a CT correlate. 3. The 13 mm sub solid nodule in the superior segment right lower lobe is stable from recent exams but has slowly  increased in size over the last 7 years. Although not hypermetabolic, the appearance is concerning for the possibility of a low grade adenocarcinoma.  She saw Dr. Roxan Hockey and cardiothoracic surgery in January. They recommended discuss the possibility of stereotactic radiation with Dr. Sondra Come versus follow-up. I have discussed the PET scan results with her. At that time as there had not been significant growth and there is no room to proceed with additional radiation she opted for short interval follow-up as her quality of life is good and she was completely asymptomatic. She comes in today in follow-up with a CT scan of the chest, abdomen and pelvis on 07/11/2015.  IMPRESSION: 1. 13 mm mixed solid and sub solid nodule in the posterior right lower lobe was present in 2009 and has clearly progressed in the interval since that study. Imaging features remain highly concerning for low-grade or well differentiated adenocarcinoma. 2. Slight increase size of in the hypermetabolic, rim enhancing nodule identified adjacent to the left L3-4 neural foramen. Metastatic disease remains a concern. 3. No evidence for discrete soft tissue lesion between the left L1 and 2 transverse processes, the site of focal FDG uptake on the recent PET-CT. 4. Cholelithiasis. 5. Abdominal aortic atherosclerosis.  She subsequently began dose dense carboplatin and paclitaxel on March 2017. She's undergone 3 cycles. She had to have growth factor support. She subsequent underwent a port placement on May 3  secondary to poor IV access. After 3 cycles of chemotherapy she underwent a PET scan which revealed:  IMPRESSION: 1. Hypermetabolic left T0-1 paraspinous metastasis (biopsy-proven) is stable in size and slightly decreased in metabolism. 2. Previously described small focus of hypermetabolism between the left L2 and L3 spinous processes has resolved. 3. New mild linear hypermetabolism to the left of the T11-12 spinous processes  without discrete mass on the CT images, favor benign activity related activity, recommend attention on follow-up PET-CT. 4. No definite new sites of hypermetabolic metastatic disease. No recurrent hypermetabolic metastatic disease in the pelvis. 5. Interval stability of subsolid 1.3 cm superior segment right lower lobe pulmonary nodule without associated significant metabolism, which has grown compared to the 2009 chest CT study, and remain suspicious for low grade adenocarcinoma. 6. Additional findings include aortic atherosclerosis, coronary atherosclerosis, mild multinodular goiter with no hypermetabolic thyroid nodules, cholelithiasis and moderate sigmoid diverticulosis.  She did have PD-L1 testing and was negative. She then underwent 3 additional cycles of chemotherapy. She did not tolerate chemotherapy and it was stopped after cycle #6 day 8 secondary to fatigue and other toxicities. She had a PET scan on October 2 that revealed:  IMPRESSION 02/12/16: 1. Interval increase in size and metabolic activity of the LEFT paraspinal soft tissue metastasis. 2. New activity within the musculature of the LEFT chest wall. Given the unusual location of the paraspinal metastasis cannot exclude a second soft tissue metastasis to the musculature however favor benign physiologic activity. 3. Stable RIGHT lower lobe pulmonary nodule.  She had a CT scan in January 2018 when she saw Dr. Marko Plume that revealed no new evidence of disease. It was compared to her CT scan November 27 of 2017. Within the left paraspinous area centered at L3-4 there was a mass that measured 2.1 x 2.1 cm. This was compared to 1.9 x 1.9. The 2.2 cm cranial caudal sagittal size was about the same.   After discussion with the family the patient opted for close follow-up and comes in today after having had a CT scan earlier this week that revealed: Musculoskeletal: LEFT paraspinal mass (L3-L4) measures 27 x 21 mm (image 29, series 2) compared  to 25 x 18 mm on prior remeasured. No new soft tissue lesions identified.  IMPRESSION: 1. Continue mild interval increase in size of LEFT paraspinal mass (1-2 mm). 2. No evidence of new metastatic disease in the abdomen pelvis. 3. Post hysterectomy anatomy.  Interval History:  10/22/2016 to 10/28/2016:  The L-spine was treated to 27 Gy in 3 fractions at 9 Gy per fraction.    CT 05/09/17: Musculoskeletal: Previously noted paraspinal mass adjacent to the left side of L3-L4 is far less distinct than prior examinations, now an amorphous area of slightly heterogeneous enhancement. Increasing amounts of low-attenuation the adjacent paraspinal musculature, likely to reflect evolving fatty degeneration from prior radiation therapy. There are no aggressive appearing lytic or blastic lesions noted in the visualized portions of the skeleton.  IMPRESSION: 1. Paraspinal mass adjacent to the left side of L3-L4 is less distinct than prior examinations, and is therefore difficult to discretely measure, however, the overall appearance suggests continued positive response to therapy. 2. No new signs of metastatic disease elsewhere in the abdomen or pelvis. 3. Aortic atherosclerosis, in addition to at least right coronary artery disease. Assessment for potential risk factor modification, dietary therapy or pharmacologic therapy may be warranted, if clinically indicated. 4. Colonic diverticulosis without evidence of acute diverticulitis at this time. 5. Additional incidental findings,  as above.  Review of Systems  Constitutional: Feeling very well and enjoying very good quality of life. Denies fever. Skin: No rash Cardiovascular: No chest pain, shortness of breath, or edema  Gastro Intestinal: No abdominal soreness.  No nausea, vomiting, constipation, or diarrhea reported.  Genitourinary: No frequency, urgency, or dysuria.  Denies vaginal bleeding and discharge.  Musculoskeletal: No joint swelling or  pain.  Neurologic: No weakness, + continued numbness around left knee and up left lateral thigh. No motor weakness, no knee buckling, no unsteadiness. Psychology: Very pleased with results.  Current Meds:  Outpatient Encounter Medications as of 05/14/2017  Medication Sig  . amLODipine (NORVASC) 5 MG tablet Take 5 mg by mouth daily.  Marland Kitchen aspirin EC 81 MG tablet Take 81 mg by mouth daily.  Marland Kitchen atenolol (TENORMIN) 25 MG tablet Take 25 mg by mouth 2 (two) times daily.   . Biotin 5000 MCG TABS Take 5,000 mcg by mouth daily.  . cholecalciferol (VITAMIN D) 1000 UNITS tablet Take 1,000 Units by mouth every Monday, Wednesday, and Friday.  . Cyanocobalamin (VITAMIN B-12) 5000 MCG SUBL Place 1 tablet under the tongue daily.   Marland Kitchen ibuprofen (ADVIL,MOTRIN) 200 MG tablet Take 200-400 mg by mouth 2 (two) times daily as needed. Reported on 11/13/2015  . lidocaine-prilocaine (EMLA) cream Apply to [facility] cath 1-2 hours prior to access as directed  . LORazepam (ATIVAN) 0.5 MG tablet 1 tablet po 30 minutes prior to radiation or MRI  . Magnesium 250 MG TABS Take 250 mg by mouth every Monday, Wednesday, and Friday.   . Multiple Vitamin (MULTIVITAMIN WITH MINERALS) TABS tablet Take 1 tablet by mouth daily.  . naproxen (NAPROSYN) 500 MG tablet Take 1 tablet (500 mg total) by mouth daily as needed.  . Omega-3 Fatty Acids (FISH OIL PO) Take 360 mg by mouth daily.  . potassium gluconate 595 MG TABS tablet Take 595 mg by mouth every Monday, Wednesday, and Friday.  . simvastatin (ZOCOR) 20 MG tablet Take 20 mg by mouth daily.   No facility-administered encounter medications on file as of 05/14/2017.     Allergy:  Allergies  Allergen Reactions  . Ace Inhibitors Anaphylaxis  . Codeine Nausea Only  . Demerol Nausea Only  . Dilaudid [Hydromorphone Hcl] Other (See Comments)    "total loss of her mind"    Social Hx:   Social History   Socioeconomic History  . Marital status: Married    Spouse name: [name]  . Number of  children: 2  . Years of education: Not on file  . Highest education level: Not on file  Social Needs  . Financial resource strain: Not on file  . Food insecurity - worry: Not on file  . Food insecurity - inability: Not on file  . Transportation needs - medical: Not on file  . Transportation needs - non-medical: Not on file  Occupational History  . Occupation: retired  Tobacco Use  . Smoking status: Former Smoker    Types: Cigarettes    Last attempt to quit: 05/13/1962    Years since quitting: 55.0  . Smokeless tobacco: Never Used  Substance and Sexual Activity  . Alcohol use: Yes    Comment: ocass  . Drug use: No  . Sexual activity: No  Other Topics Concern  . Not on file  Social History Narrative  . Not on file    Past Surgical Hx:  Past Surgical History:  Procedure Laterality Date  . APPENDECTOMY    . BREAST LUMPECTOMY    .  FOOT SURGERY     RIGHT  . TONSILLECTOMY    . TOTAL ABDOMINAL HYSTERECTOMY W/ BILATERAL SALPINGOOPHORECTOMY      Past Medical Hx:  Past Medical History:  Diagnosis Date  . Endometrial cancer ([place]) 12/2007   s/p total abdominal hysterectomy at [facility] - ovaries were also removed   . Family history of breast cancer   . History of breast cancer    right breast -- treated with lumpectomy and radiation therarpy, postoperatively with tamoxifen   . History of radiation therapy 10/22/2016 to 10/28/2016  . Hypercholesterolemia   . Hyperlipidemia   . Hypertension   . Palpitations   . PVC's (premature ventricular contractions)   . Radiation 07/11/14-08/12/14   left lumbar paraspinal area 55 gray  . S/P radiation therapy 10/28/11-12/05/11   5040 cGy left pelvis  . S/P radiation therapy    Intracavitary brachytherapy of uterus    Family Hx:  Family History  Problem Relation Age of Onset  . Thrombosis Father        coronary thrombosis  . Hypertension Father   . Heart failure Mother   . Coronary artery disease Brother   . Breast cancer Maternal  Aunt        dx in her 31s  . Stroke Maternal Uncle   . Stroke Maternal Grandfather     Vitals:  Blood pressure (!) 172/80, pulse 61, temperature 98.5 F (36.9 C), temperature source Oral, resp. rate 18, height '4\' 11"'$  (1.499 m), weight 112 lb 8 oz (51 kg), SpO2 100 %.  Physical Exam:  General: Well developed, well nourished female in no acute distress. Alert and oriented x 3.   Patient declined exam as no symptoms and CT scan was negative.  Assessment/Plan:  77 year old with recurrent uterine papillary serous carcinoma initially stage IB diagnosed in 2009.  She is s/p resection of pelvic mass, left sided ureterolysis, intra-operative radiation therapy by radiation oncology, omental J flap, and left ureteral stent placement by urology at [facility]  on 04/02/13.  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   She then unfortunately suffered a recurrence in the left paraspinal space and was treated with Zometa as well as IMRT. Imaging shows that the lesion was initially smaller but slowly grew in size.   She subsequently had undergone 3 cycles of dose dense carboplatin with a taxane. Her disease appeared stable with some potential decrease in the metabolic activity. She underwent an additional 3 cycles and had an increase in size of the mass and increasing FDG avidity. In June 2018 she underwent cyberknife to the region.  Most recent imaging continues to show the area as being less distinct compared to prior continuing to show treatment effect. At this time we will continue to follow her expectantly. I would recommend repeat imaging in 6 months and she and her husband are very comfortable with this.  With regards to the numbness on the left leg. I discussed with her that this most likely represents a call of her treatment.  We discussed that radiation can continue to have effects that linger. Her prior pelvic recurrence was in the left obturator space that we removed a lesion there and then follow that with intraoperative radiation therapy. That conjunction with the radiation to her back  This could be a contributing  factor. She does currently have an appointment with orthopedics and she will follow-up with them but felt very reassured and felt much better after explaining we discussed today. She had not really made a connection between the numbness been on the left side and her recurrence on the left side and having intraoperative radiation and the spinal radiation.  I discussed with her that I will not be coming to [facility] after the end of this month. She will be following up here but no Angelia Mould happy to see her at any time in the future. She is aware that we'll contact her with the results of her CT scans. She will follow-up with her other providers as scheduled.  Clearly 25 minutes face-to-face time was spent with the patient and her husband all of which was in discussion and consultation.        Emmalin Jaquess A.,  05/14/2017, 11:32 AM

## 2017-05-15 ENCOUNTER — Ambulatory Visit
Admission: RE | Admit: 2017-05-15 | Discharge: 2017-05-15 | Disposition: A | Discharge status: Home / Self Care | Payer: Medicare Other | Source: Ambulatory Visit | Attending: Radiation Oncology | Admitting: Radiation Oncology

## 2017-05-15 ENCOUNTER — Telehealth: Payer: Self-pay

## 2017-05-15 ENCOUNTER — Other Ambulatory Visit: Payer: Self-pay | Admitting: Hematology and Oncology

## 2017-05-15 DIAGNOSIS — M48061 Spinal stenosis, lumbar region without neurogenic claudication: Secondary | ICD-10-CM | POA: Diagnosis not present

## 2017-05-15 DIAGNOSIS — C7951 Secondary malignant neoplasm of bone: Secondary | ICD-10-CM

## 2017-05-15 MED ORDER — GADOBENATE DIMEGLUMINE 529 MG/ML IV SOLN: 9.0000 mL | Freq: Once | INTRAVENOUS | Status: DC | PRN

## 2017-05-15 NOTE — Telephone Encounter (Signed)
Gave [name] the appointment for her CT A/P on 10-28-17 at 1000. Arrive at 45 at [facility]. She has appointment for B-met at [facility] on 10-27-17 at 1315  with PAC  flush.  She will P/U her contrast and directions for CT from [place] that day.  Pt told NPO 4 hrs prior to the scan.  She will call Dr. Creed Copper office later this year with decision to f/u with her in [place] or in [place] with Dr. Gerarda Fraction.

## 2017-05-19 ENCOUNTER — Encounter: Payer: Self-pay | Admitting: Radiation Oncology

## 2017-05-19 ENCOUNTER — Other Ambulatory Visit: Payer: Self-pay

## 2017-05-19 ENCOUNTER — Ambulatory Visit
Admission: RE | Admit: 2017-05-19 | Discharge: 2017-05-19 | Disposition: A | Discharge status: Home / Self Care | Payer: Medicare Other | Source: Ambulatory Visit | Attending: Radiation Oncology | Admitting: Radiation Oncology

## 2017-05-19 VITALS — BP 128/70 | HR 63 | Temp 98.2°F | Resp 18 | Ht 59.0 in | Wt 112.0 lb

## 2017-05-19 DIAGNOSIS — Z90710 Acquired absence of both cervix and uterus: Secondary | ICD-10-CM | POA: Diagnosis not present

## 2017-05-19 DIAGNOSIS — Z170 Estrogen receptor positive status [ER+]: Secondary | ICD-10-CM | POA: Insufficient documentation

## 2017-05-19 DIAGNOSIS — Z9221 Personal history of antineoplastic chemotherapy: Secondary | ICD-10-CM | POA: Insufficient documentation

## 2017-05-19 DIAGNOSIS — Z7982 Long term (current) use of aspirin: Secondary | ICD-10-CM | POA: Insufficient documentation

## 2017-05-19 DIAGNOSIS — R002 Palpitations: Secondary | ICD-10-CM | POA: Insufficient documentation

## 2017-05-19 DIAGNOSIS — C7951 Secondary malignant neoplasm of bone: Secondary | ICD-10-CM | POA: Insufficient documentation

## 2017-05-19 DIAGNOSIS — K5730 Diverticulosis of large intestine without perforation or abscess without bleeding: Secondary | ICD-10-CM | POA: Diagnosis not present

## 2017-05-19 DIAGNOSIS — E7800 Pure hypercholesterolemia, unspecified: Secondary | ICD-10-CM | POA: Diagnosis not present

## 2017-05-19 DIAGNOSIS — I700 Atherosclerosis of aorta: Secondary | ICD-10-CM | POA: Diagnosis not present

## 2017-05-19 DIAGNOSIS — Z803 Family history of malignant neoplasm of breast: Secondary | ICD-10-CM | POA: Diagnosis not present

## 2017-05-19 DIAGNOSIS — Z08 Encounter for follow-up examination after completed treatment for malignant neoplasm: Secondary | ICD-10-CM | POA: Diagnosis not present

## 2017-05-19 DIAGNOSIS — G5782 Other specified mononeuropathies of left lower limb: Secondary | ICD-10-CM | POA: Insufficient documentation

## 2017-05-19 DIAGNOSIS — Z87891 Personal history of nicotine dependence: Secondary | ICD-10-CM | POA: Insufficient documentation

## 2017-05-19 DIAGNOSIS — Z9889 Other specified postprocedural states: Secondary | ICD-10-CM | POA: Diagnosis not present

## 2017-05-19 DIAGNOSIS — R200 Anesthesia of skin: Secondary | ICD-10-CM | POA: Diagnosis not present

## 2017-05-19 DIAGNOSIS — Z8249 Family history of ischemic heart disease and other diseases of the circulatory system: Secondary | ICD-10-CM | POA: Insufficient documentation

## 2017-05-19 DIAGNOSIS — Z853 Personal history of malignant neoplasm of breast: Secondary | ICD-10-CM | POA: Diagnosis not present

## 2017-05-19 DIAGNOSIS — Z79899 Other long term (current) drug therapy: Secondary | ICD-10-CM | POA: Insufficient documentation

## 2017-05-19 DIAGNOSIS — Z823 Family history of stroke: Secondary | ICD-10-CM | POA: Diagnosis not present

## 2017-05-19 DIAGNOSIS — Z85830 Personal history of malignant neoplasm of bone: Secondary | ICD-10-CM | POA: Diagnosis not present

## 2017-05-19 DIAGNOSIS — I10 Essential (primary) hypertension: Secondary | ICD-10-CM | POA: Diagnosis not present

## 2017-05-19 DIAGNOSIS — G629 Polyneuropathy, unspecified: Secondary | ICD-10-CM | POA: Diagnosis not present

## 2017-05-19 DIAGNOSIS — Z888 Allergy status to other drugs, medicaments and biological substances status: Secondary | ICD-10-CM | POA: Insufficient documentation

## 2017-05-19 DIAGNOSIS — Z923 Personal history of irradiation: Secondary | ICD-10-CM | POA: Diagnosis not present

## 2017-05-19 DIAGNOSIS — C549 Malignant neoplasm of corpus uteri, unspecified: Secondary | ICD-10-CM

## 2017-05-19 DIAGNOSIS — Z885 Allergy status to narcotic agent status: Secondary | ICD-10-CM | POA: Diagnosis not present

## 2017-05-19 DIAGNOSIS — R590 Localized enlarged lymph nodes: Secondary | ICD-10-CM | POA: Insufficient documentation

## 2017-05-19 DIAGNOSIS — C7982 Secondary malignant neoplasm of genital organs: Secondary | ICD-10-CM | POA: Diagnosis not present

## 2017-05-19 DIAGNOSIS — Z8542 Personal history of malignant neoplasm of other parts of uterus: Secondary | ICD-10-CM | POA: Diagnosis not present

## 2017-05-19 DIAGNOSIS — Z791 Long term (current) use of non-steroidal anti-inflammatories (NSAID): Secondary | ICD-10-CM | POA: Insufficient documentation

## 2017-05-20 NOTE — Progress Notes (Addendum)
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     Interval Since Last Radiation: 7 months  10/22/2016 to 10/28/2016:  The L-spine, specifically L3 was treated to 27 Gy in 3 fractions at 9 Gy per fraction.   07/11/14-08/12/14:  55 Gy in 25 fractions to the Lumbar spine  04/02/13:  Intraoperative radiotherapy to the left pelvis.  10/28/11-12/05/11: Radiotherapy to the left pelvic sidewall, details not available at time of dictation  2010:  Vaginal cuff brachytherapy, details of her dose are unknown  2000:  Adjuvant radiotherapy to the right breast, details are unknown  Narrative:   [name] is a 77 y.o.  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 The patient has a history of Stage I, T1N0, ER/PR positive, HER2 2+ ductal right breast cancer in 2000 which was treated with lumpectomy, sentinel lymph node assessment and tamoxifen. She was diagnosed with stage IB papillary serous endometrial carcinoma in August 2009, underwent radical hysterectomy with 15 negative lymph nodes however LVSI was noted. She completed 6 cycles of Taxol carboplatin in January 2010, followed by a vaginal cuff brachytherapy. Her uterine cancer recurred in the fall of 2012 in the pelvic sidewall. She continued chemotherapy, and proceeded with radiotherapy in 2013 to the site. She also developed a new mass in the pelvis, she subsequently underwent radical resection at [facility] in November 2014 with resection of pelvic mass, left ureteral lysis, intraoperative radiotherapy, omental J flap and left ureteral stent placement. She did well until 2016 when she  had lumbar pain and an MRI in an orthopedic office revealed a 2.7 x 2.4 x 1.9 cm mass at the L3-L4 location with left neural foramen involvement, and focal destruction of L3. A CT guided biopsy confirmed high grade serous carcinoma consistent with her uterine cancer. She received Radiotherapy to the lumbar spine, and began Zometa along with her systemic therapy. She had new pulmonary disease identified in the right lower lobe in January 2017, this was stabilized with chemotherapy, and her paraspinal mass was also relatively stable until her last scan on 09/02/16 measured this at 27 x 21 mm, previously 25 x 18 mm in January 2018. She did not have any other surgical options available, and underwent SRS to the paraspinal disease in June 2018. She continues in surveillance in the brain and spine oncology conference and is seen today to review her results from her lumbar spine MRI on 05/15/17.    On review of systems, the patient reports that she is doing well overall. She denies any chest pain, shortness of breath, cough, fevers, chills, night sweats, unintended weight changes. She denies any bowel or bladder disturbances, and denies abdominal pain, nausea or vomiting. She denies any new musculoskeletal or joint aches or pains, new skin lesions or concerns. She continues to note numbness in her left knee for and reports this has migrated slightly more distally. She denies any limitations functionally or more intense sensory changes. A complete review of systems is obtained and is otherwise negative.   Past Medical History:  Past Medical History:  Diagnosis Date  . Endometrial cancer ([place]) 12/2007   s/p  total abdominal hysterectomy at [facility] - ovaries were also removed   . Family history of breast cancer   . History of breast cancer    right breast -- treated with lumpectomy and radiation therarpy, postoperatively with tamoxifen   . History of radiation therapy 10/22/2016 to 10/28/2016  .  Hypercholesterolemia   . Hyperlipidemia   . Hypertension   . Palpitations   . PVC's (premature ventricular contractions)   . Radiation 07/11/14-08/12/14   left lumbar paraspinal area 55 gray  . S/P radiation therapy 10/28/11-12/05/11   5040 cGy left pelvis  . S/P radiation therapy    Intracavitary brachytherapy of uterus    Past Surgical History: Past Surgical History:  Procedure Laterality Date  . APPENDECTOMY    . BREAST LUMPECTOMY    . FOOT SURGERY     RIGHT  . TONSILLECTOMY    . TOTAL ABDOMINAL HYSTERECTOMY W/ BILATERAL SALPINGOOPHORECTOMY      Social History:  Social History   Socioeconomic History  . Marital status: Married    Spouse name: [name]  . Number of children: 2  . Years of education: Not on file  . Highest education level: Not on file  Social Needs  . Financial resource strain: Not on file  . Food insecurity - worry: Not on file  . Food insecurity - inability: Not on file  . Transportation needs - medical: Not on file  . Transportation needs - non-medical: Not on file  Occupational History  . Occupation: retired  Tobacco Use  . Smoking status: Former Smoker    Types: Cigarettes    Last attempt to quit: 05/13/1962    Years since quitting: 55.0  . Smokeless tobacco: Never Used  Substance and Sexual Activity  . Alcohol use: Yes    Comment: ocass  . Drug use: No  . Sexual activity: No  Other Topics Concern  . Not on file  Social History Narrative  . 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  Family History: Family History  Problem Relation Age of Onset  . Thrombosis Father        coronary thrombosis  . Hypertension Father   . Heart failure Mother   . Coronary artery disease Brother   . Breast cancer Maternal Aunt        dx in her 101s  . Stroke Maternal Uncle   . Stroke Maternal Grandfather      ALLERGIES:  is allergic to ace inhibitors;  codeine; demerol; and dilaudid [hydromorphone hcl].  Meds: Current Outpatient Medications  Medication Sig Dispense Refill  . amLODipine (NORVASC) 5 MG tablet Take 5 mg by mouth daily.    Marland Kitchen aspirin EC 81 MG tablet Take 81 mg by mouth daily.    Marland Kitchen atenolol (TENORMIN) 25 MG tablet Take 25 mg by mouth 2 (two) times daily.     . Biotin 5000 MCG TABS Take 5,000 mcg by mouth daily.    . cholecalciferol (VITAMIN D) 1000 UNITS tablet Take 1,000 Units by mouth every Monday, Wednesday, and Friday.    . Cyanocobalamin (VITAMIN B-12) 5000 MCG SUBL Place 1 tablet under the tongue daily.     Marland Kitchen ibuprofen (ADVIL,MOTRIN) 200 MG tablet Take 200-400 mg by mouth 2 (two) times daily as needed. Reported on 11/13/2015    . lidocaine-prilocaine (EMLA) cream Apply to [facility] cath 1-2 hours prior to access as directed 30  g 1  . LORazepam (ATIVAN) 0.5 MG tablet 1 tablet po 30 minutes prior to radiation or MRI 30 tablet 0  . Magnesium 250 MG TABS Take 250 mg by mouth every Monday, Wednesday, and Friday.     . Multiple Vitamin (MULTIVITAMIN WITH MINERALS) TABS tablet Take 1 tablet by mouth daily.    . Omega-3 Fatty Acids (FISH OIL PO) Take 360 mg by mouth daily.    . potassium gluconate 595 MG TABS tablet Take 595 mg by mouth every Monday, Wednesday, and Friday.    . simvastatin (ZOCOR) 20 MG tablet Take 20 mg by mouth daily.    . naproxen (NAPROSYN) 500 MG tablet Take 1 tablet (500 mg total) by mouth daily as needed. (Patient not taking: Reported on 05/19/2017) 60 tablet 1   No current facility-administered medications for this encounter.     Physical Findings:  height is '4\' 11"'$  (1.499 m) and weight is 112 lb (50.8 kg). Her oral temperature is 98.2 F (36.8 C). Her blood pressure is 128/70 and her pulse is 63. Her respiration is 18 and oxygen saturation is 100%.  Pain Assessment Pain Score: 0-No pain/10 In general this is a well appearing caucasian female in no acute distress. She's alert and oriented x4 and appropriate  throughout the examination. Cardiopulmonary assessment is negative for acute distress and she exhibits normal effort. The lower extremities are negative for edema. She has intact sensation to bilateral lower extremities to light touch, but more sensation R>L.  Lab Findings: Lab Results  Component Value Date   WBC 4.7 05/09/2017   HGB 12.2 05/09/2017   HCT 36.9 05/09/2017   MCV 96.7 05/09/2017   PLT 180 05/09/2017     Radiographic Findings: Mr Lumbar Spine W Wo Contrast  Result Date: 05/15/2017 CLINICAL DATA:  Recurrent endometrial carcinoma with radiated lumbar spinal metastases June 2018. EXAM: MRI LUMBAR SPINE WITHOUT AND WITH CONTRAST TECHNIQUE: Multiplanar and multiecho pulse sequences of the lumbar spine were obtained without and with intravenous contrast. CONTRAST:  9 cc MultiHance intravenous COMPARISON:  01/23/2017 FINDINGS: Segmentation:  Standard. Alignment:  Thoracolumbar levoscoliosis.  Anterolisthesis at L4-5. Vertebrae: Dense fatty marrow at L2-L5 compatible with prior radiation. Stable signal abnormality in the posterosuperior corner of L3, presumably treated metastasis. No new or progressive osseous metastatic disease. Degenerative type signal alteration about the L1-2 disc space. Negative for fracture or discitis. Conus medullaris and cauda equina: Conus extends to the L1 level. Conus and cauda equina appear normal. Paraspinal and other soft tissues: Far-lateral mass centered on the left at L3-4 foramen has an amorphous shape limiting reproducible measurement. On axial precontrast T1 weighted imaging there is stable T1 hypointense material measuring up to 22 mm AP. This area has central cavitary changes on postcontrast imaging, also stable. Stable radiation / denervation signal abnormality in the left psoas and in the atrophic left quadratus lumborum. Disc levels: T12- L1: Asymmetric rightward disc narrowing and bulging. No impingement L1-L2: Advanced asymmetric rightward disc  degeneration with collapse and far-lateral spurring. Asymmetric right facet spurring. Noncompressive right subarticular recess and foraminal narrowing. L2-L3: Disc narrowing and endplate degeneration with asymmetric rightward collapse. L3-L4: Disc narrowing and bulging with asymmetric left facet spurring. Asymmetric left subarticular recess narrowing which could affect the descending L4 nerve root. Left foraminal and extraforaminal L3 impingement by tumor. L4-L5: Facet arthropathy with anterolisthesis. Disc bulging. Spinal stenosis with left more than right subarticular recess narrowing. Left more than right foraminal stenosis with left L4 impingement. L5-S1:Facet arthropathy asymmetric to  the right.  No impingement. IMPRESSION: Stable treated left paraspinous mass at L3-4. No new or progressive disease. Electronically Signed   By: Monte Fantasia M.D.   On: 05/15/2017 15:24   Ct Abdomen Pelvis W Contrast  Result Date: 05/09/2017 CLINICAL DATA:  77 year old female with history of endometrial carcinoma diagnosed in 2009 with metastatic disease. Recurrence in 2016. Chemotherapy recently completed in September 2017. Radiation therapy to the lumbar spine completed in June 2018. Additional history of breast cancer diagnosed in 2000 status post lumpectomy and radiation therapy. EXAM: CT ABDOMEN AND PELVIS WITH CONTRAST TECHNIQUE: Multidetector CT imaging of the abdomen and pelvis was performed using the standard protocol following bolus administration of intravenous contrast. CONTRAST:  159m ISOVUE-300 IOPAMIDOL (ISOVUE-300) INJECTION 61% COMPARISON:  CT the abdomen and pelvis 01/21/2017. FINDINGS: Lower chest: Cardiomegaly. Atherosclerotic calcifications in the right coronary artery. Calcifications of the mitral annulus. Aortic atherosclerosis. Areas of scarring and/or subsegmental atelectasis in the right middle lobe and inferior segment of the lingula. Hepatobiliary: No suspicious cystic or solid hepatic lesions.  No intra or extrahepatic biliary ductal dilatation. Gallbladder is nearly completely decompressed, and contains multiple partially calcified gallstones lying dependently. No surrounding inflammatory changes are noted. Pancreas: No pancreatic mass. No pancreatic ductal dilatation. No pancreatic or peripancreatic fluid or inflammatory changes. Spleen: Unremarkable. Adrenals/Urinary Tract: Bilateral kidneys and bilateral adrenal glands are normal in appearance. No hydroureteronephrosis. Urinary bladder is normal in appearance. Stomach/Bowel: Stomach is normal in appearance. No pathologic dilatation of small bowel or colon. Numerous colonic diverticulae are noted, particularly in the sigmoid colon, without surrounding inflammatory changes to suggest an acute diverticulitis at this time. The appendix is not confidently identified and may be surgically absent. Regardless, there are no inflammatory changes noted adjacent to the cecum to suggest the presence of an acute appendicitis at this time. Vascular/Lymphatic: Aortic atherosclerosis, without evidence of aneurysm or dissection noted in the abdominal or pelvic vasculature. No lymphadenopathy noted in the abdomen or pelvis. Reproductive: Status post total abdominal hysterectomy and bilateral salpingo-oophorectomy. No unexpected soft tissue mass in the low anatomic pelvis to suggest locally recurrent disease. Other: No significant volume of ascites.  No pneumoperitoneum. Musculoskeletal: Previously noted paraspinal mass adjacent to the left side of L3-L4 is far less distinct than prior examinations, now an amorphous area of slightly heterogeneous enhancement. Increasing amounts of low-attenuation the adjacent paraspinal musculature, likely to reflect evolving fatty degeneration from prior radiation therapy. There are no aggressive appearing lytic or blastic lesions noted in the visualized portions of the skeleton. IMPRESSION: 1. Paraspinal mass adjacent to the left side  of L3-L4 is less distinct than prior examinations, and is therefore difficult to discretely measure, however, the overall appearance suggests continued positive response to therapy. 2. No new signs of metastatic disease elsewhere in the abdomen or pelvis. 3. Aortic atherosclerosis, in addition to at least right coronary artery disease. Assessment for potential risk factor modification, dietary therapy or pharmacologic therapy may be warranted, if clinically indicated. 4. Colonic diverticulosis without evidence of acute diverticulitis at this time. 5. Additional incidental findings, as above. Aortic Atherosclerosis (ICD10-I70.0). Electronically Signed   By: DVinnie LangtonM.D.   On: 05/09/2017 12:03    Impression/Plan: 1. 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  The patient appears to be doing very well and radiographically stable within the spine and paraspinal region, we discussed the recommendations from conference were to repeat and MRI in  3 month's time, and she is in agreement with this plan. She will be due for complete spinal imaging in the summer. We will coordinate this at her next interval scan. She will contact us sooner if she has questions or concerns regarding her prior treatment for discussed plan moving forward. She will also plan to follow up with Dr. Alvy Bimler and Dr. [place]. 2. Left thigh/knee neuropathy. This is likely multifactorial from prior treatments, surgery, and from her disease location. She is not limited functionally from this and we discussed OTC vitamin B6 may be beneficial with low side effects/risk. She will consider this at 100-200 mg daily. She will keep [place] notified of progressive symptoms if they arise. Of note she has not tolerated gabapentin in the past for nerve pain due to degenerative changes.    Carola Rhine, PAC

## 2017-05-20 NOTE — Addendum Note (Signed)
Encounter addended by: Hayden Pedro, PA-C on: 05/20/2017 10:19 AM  Actions taken: Sign clinical note

## 2017-05-26 ENCOUNTER — Telehealth: Payer: Self-pay | Admitting: *Deleted

## 2017-05-26 NOTE — Telephone Encounter (Signed)
Patient returned call and we scheduled her flush appts.

## 2017-05-28 ENCOUNTER — Ambulatory Visit: Payer: Medicare Other | Admitting: Gynecologic Oncology

## 2017-06-19 ENCOUNTER — Other Ambulatory Visit: Payer: Self-pay | Admitting: Radiation Therapy

## 2017-06-19 DIAGNOSIS — C7951 Secondary malignant neoplasm of bone: Secondary | ICD-10-CM

## 2017-06-26 ENCOUNTER — Inpatient Hospital Stay: Payer: Medicare Other | Attending: Hematology and Oncology

## 2017-06-26 DIAGNOSIS — C541 Malignant neoplasm of endometrium: Secondary | ICD-10-CM | POA: Diagnosis not present

## 2017-06-26 DIAGNOSIS — Z452 Encounter for adjustment and management of vascular access device: Secondary | ICD-10-CM | POA: Diagnosis not present

## 2017-06-26 DIAGNOSIS — Z95828 Presence of other vascular implants and grafts: Secondary | ICD-10-CM

## 2017-06-26 DIAGNOSIS — D701 Agranulocytosis secondary to cancer chemotherapy: Secondary | ICD-10-CM

## 2017-06-26 DIAGNOSIS — T451X5A Adverse effect of antineoplastic and immunosuppressive drugs, initial encounter: Secondary | ICD-10-CM

## 2017-06-26 DIAGNOSIS — C7951 Secondary malignant neoplasm of bone: Secondary | ICD-10-CM

## 2017-06-26 DIAGNOSIS — Z853 Personal history of malignant neoplasm of breast: Secondary | ICD-10-CM

## 2017-06-26 MED ORDER — SODIUM CHLORIDE 0.9% FLUSH
10.0000 mL | Freq: Once | INTRAVENOUS | Status: AC
  Administered 2017-06-26: 10 mL
  Filled 2017-06-26: qty 10

## 2017-06-26 MED ORDER — HEPARIN SOD (PORK) LOCK FLUSH 100 UNIT/ML IV SOLN
500.0000 [IU] | Freq: Once | INTRAVENOUS | Status: AC
  Administered 2017-06-26: 500 [IU]
  Filled 2017-06-26: qty 5

## 2017-06-30 DIAGNOSIS — M5416 Radiculopathy, lumbar region: Secondary | ICD-10-CM | POA: Diagnosis not present

## 2017-07-29 ENCOUNTER — Other Ambulatory Visit: Payer: Self-pay | Admitting: Internal Medicine

## 2017-07-29 DIAGNOSIS — Z1231 Encounter for screening mammogram for malignant neoplasm of breast: Secondary | ICD-10-CM

## 2017-08-07 ENCOUNTER — Inpatient Hospital Stay: Payer: Medicare Other | Attending: Hematology and Oncology

## 2017-08-07 DIAGNOSIS — T451X5A Adverse effect of antineoplastic and immunosuppressive drugs, initial encounter: Secondary | ICD-10-CM

## 2017-08-07 DIAGNOSIS — C541 Malignant neoplasm of endometrium: Secondary | ICD-10-CM | POA: Insufficient documentation

## 2017-08-07 DIAGNOSIS — Z853 Personal history of malignant neoplasm of breast: Secondary | ICD-10-CM

## 2017-08-07 DIAGNOSIS — Z452 Encounter for adjustment and management of vascular access device: Secondary | ICD-10-CM | POA: Insufficient documentation

## 2017-08-07 DIAGNOSIS — Z95828 Presence of other vascular implants and grafts: Secondary | ICD-10-CM

## 2017-08-07 DIAGNOSIS — C7951 Secondary malignant neoplasm of bone: Secondary | ICD-10-CM

## 2017-08-07 DIAGNOSIS — D701 Agranulocytosis secondary to cancer chemotherapy: Secondary | ICD-10-CM

## 2017-08-07 MED ORDER — SODIUM CHLORIDE 0.9% FLUSH
10.0000 mL | Freq: Once | INTRAVENOUS | Status: AC
  Administered 2017-08-07: 10 mL
  Filled 2017-08-07: qty 10

## 2017-08-07 MED ORDER — HEPARIN SOD (PORK) LOCK FLUSH 100 UNIT/ML IV SOLN
500.0000 [IU] | Freq: Once | INTRAVENOUS | Status: AC
  Administered 2017-08-07: 500 [IU]
  Filled 2017-08-07: qty 5

## 2017-08-13 ENCOUNTER — Ambulatory Visit
Admission: RE | Admit: 2017-08-13 | Discharge: 2017-08-13 | Disposition: A | Discharge status: Home / Self Care | Payer: Medicare Other | Source: Ambulatory Visit | Attending: Internal Medicine | Admitting: Internal Medicine

## 2017-08-13 DIAGNOSIS — Z1231 Encounter for screening mammogram for malignant neoplasm of breast: Secondary | ICD-10-CM | POA: Diagnosis not present

## 2017-08-13 HISTORY — DX: Personal history of irradiation: Z92.3

## 2017-08-14 ENCOUNTER — Ambulatory Visit
Admission: RE | Admit: 2017-08-14 | Discharge: 2017-08-14 | Disposition: A | Discharge status: Home / Self Care | Payer: Medicare Other | Source: Ambulatory Visit | Attending: Radiation Oncology | Admitting: Radiation Oncology

## 2017-08-14 DIAGNOSIS — C7951 Secondary malignant neoplasm of bone: Secondary | ICD-10-CM | POA: Diagnosis not present

## 2017-08-14 MED ORDER — GADOBENATE DIMEGLUMINE 529 MG/ML IV SOLN
10.0000 mL | Freq: Once | INTRAVENOUS | Status: AC | PRN
  Administered 2017-08-14: 10 mL via INTRAVENOUS

## 2017-08-18 ENCOUNTER — Other Ambulatory Visit: Payer: Self-pay

## 2017-08-18 ENCOUNTER — Encounter: Payer: Self-pay | Admitting: Radiation Oncology

## 2017-08-18 ENCOUNTER — Ambulatory Visit
Admission: RE | Admit: 2017-08-18 | Discharge: 2017-08-18 | Disposition: A | Discharge status: Home / Self Care | Payer: Medicare Other | Source: Ambulatory Visit | Attending: Radiation Oncology | Admitting: Radiation Oncology

## 2017-08-18 VITALS — BP 143/69 | HR 65 | Temp 98.6°F | Resp 16 | Ht 59.0 in | Wt 106.8 lb

## 2017-08-18 DIAGNOSIS — Z888 Allergy status to other drugs, medicaments and biological substances status: Secondary | ICD-10-CM | POA: Insufficient documentation

## 2017-08-18 DIAGNOSIS — Z923 Personal history of irradiation: Secondary | ICD-10-CM | POA: Insufficient documentation

## 2017-08-18 DIAGNOSIS — C7951 Secondary malignant neoplasm of bone: Secondary | ICD-10-CM

## 2017-08-18 DIAGNOSIS — Z803 Family history of malignant neoplasm of breast: Secondary | ICD-10-CM | POA: Diagnosis not present

## 2017-08-18 DIAGNOSIS — G5792 Unspecified mononeuropathy of left lower limb: Secondary | ICD-10-CM | POA: Insufficient documentation

## 2017-08-18 DIAGNOSIS — Z87891 Personal history of nicotine dependence: Secondary | ICD-10-CM | POA: Insufficient documentation

## 2017-08-18 DIAGNOSIS — R590 Localized enlarged lymph nodes: Secondary | ICD-10-CM | POA: Diagnosis not present

## 2017-08-18 DIAGNOSIS — C7989 Secondary malignant neoplasm of other specified sites: Secondary | ICD-10-CM | POA: Diagnosis not present

## 2017-08-18 DIAGNOSIS — Z7982 Long term (current) use of aspirin: Secondary | ICD-10-CM | POA: Diagnosis not present

## 2017-08-18 DIAGNOSIS — Z8249 Family history of ischemic heart disease and other diseases of the circulatory system: Secondary | ICD-10-CM | POA: Diagnosis not present

## 2017-08-18 DIAGNOSIS — Z90722 Acquired absence of ovaries, bilateral: Secondary | ICD-10-CM | POA: Insufficient documentation

## 2017-08-18 DIAGNOSIS — Z9221 Personal history of antineoplastic chemotherapy: Secondary | ICD-10-CM | POA: Insufficient documentation

## 2017-08-18 DIAGNOSIS — Z853 Personal history of malignant neoplasm of breast: Secondary | ICD-10-CM | POA: Diagnosis not present

## 2017-08-18 DIAGNOSIS — C549 Malignant neoplasm of corpus uteri, unspecified: Secondary | ICD-10-CM

## 2017-08-18 DIAGNOSIS — Z8541 Personal history of malignant neoplasm of cervix uteri: Secondary | ICD-10-CM | POA: Diagnosis not present

## 2017-08-18 DIAGNOSIS — Z79899 Other long term (current) drug therapy: Secondary | ICD-10-CM | POA: Insufficient documentation

## 2017-08-18 DIAGNOSIS — R202 Paresthesia of skin: Secondary | ICD-10-CM

## 2017-08-18 DIAGNOSIS — Z85830 Personal history of malignant neoplasm of bone: Secondary | ICD-10-CM | POA: Diagnosis not present

## 2017-08-18 DIAGNOSIS — Z885 Allergy status to narcotic agent status: Secondary | ICD-10-CM | POA: Insufficient documentation

## 2017-08-18 DIAGNOSIS — Z90710 Acquired absence of both cervix and uterus: Secondary | ICD-10-CM | POA: Diagnosis not present

## 2017-08-18 DIAGNOSIS — R200 Anesthesia of skin: Secondary | ICD-10-CM

## 2017-08-18 DIAGNOSIS — Z8542 Personal history of malignant neoplasm of other parts of uterus: Secondary | ICD-10-CM | POA: Insufficient documentation

## 2017-08-18 DIAGNOSIS — Z08 Encounter for follow-up examination after completed treatment for malignant neoplasm: Secondary | ICD-10-CM | POA: Diagnosis not present

## 2017-08-18 DIAGNOSIS — I10 Essential (primary) hypertension: Secondary | ICD-10-CM | POA: Diagnosis not present

## 2017-08-18 DIAGNOSIS — G9009 Other idiopathic peripheral autonomic neuropathy: Secondary | ICD-10-CM | POA: Diagnosis not present

## 2017-08-18 NOTE — Addendum Note (Signed)
Encounter addended by: Malena Edman, RN on: 08/18/2017 3:56 PM  Actions taken: Charge Capture section accepted

## 2017-08-18 NOTE — Progress Notes (Signed)
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     Interval Since Last Radiation: 10 months  10/22/2016 to 10/28/2016:  The L-spine, specifically L3 was treated to 27 Gy in 3 fractions at 9 Gy per fraction.   07/11/14-08/12/14:  55 Gy in 25 fractions to the Lumbar spine  04/02/13:  Intraoperative radiotherapy to the left pelvis.  10/28/11-12/05/11: Radiotherapy to the left pelvic sidewall, details not available at time of dictation  2010:  Vaginal cuff brachytherapy, details of her dose are unknown  2000:  Adjuvant radiotherapy to the right breast, details are unknown  Narrative:   [name] is a 77 y.o.  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 The patient has a history of Stage I, T1N0, ER/PR positive, HER2 2+ ductal right breast cancer in 2000 which was treated with lumpectomy, sentinel lymph node assessment and tamoxifen. She was diagnosed with stage IB papillary serous endometrial carcinoma in August 2009, underwent radical hysterectomy with 15 negative lymph nodes however LVSI was noted. She completed 6 cycles of Taxol carboplatin in January 2010, followed by a vaginal cuff brachytherapy. Her uterine cancer recurred in the fall of 2012 in the pelvic sidewall. She continued chemotherapy, and proceeded with radiotherapy in 2013 to the site. She also developed disease in the pelvis, she subsequently underwent radical resection at [facility] in November 2014 with resection of pelvic mass, left ureteral lysis, intraoperative radiotherapy, omental J flap and left ureteral stent placement. She did well until 2016 when she had  lumbar pain and an MRI in an orthopedic office revealed a 2.7 x 2.4 x 1.9 cm mass at the L3-L4 location with left neural foramen involvement, and focal destruction of L3. A CT guided biopsy confirmed high grade serous carcinoma consistent with her uterine cancer. She received Radiotherapy to the lumbar spine, and began Zometa along with her systemic therapy. She had new pulmonary disease identified in the right lower lobe in January 2017, this was stabilized with chemotherapy, and her paraspinal mass was also relatively stable until her last scan on 09/02/16 measured this at 27 x 21 mm, previously 25 x 18 mm in January 2018. She did not have any other surgical options available, and underwent SRS to the paraspinal disease in June 2018. She continues in surveillance in the brain and spine oncology conference and is seen today to review her results from her lumbar spine MRI on 08/14/17.  She also follows in surveillance with Dr. Alvy Bimler and is due for restaging in June 2019.  On review of systems, the patient reports that she is doing well overall. She denies any chest pain, shortness of breath, cough, fevers, chills, night sweats, unintended weight changes. She continues to have some symptoms of numbness and tingling in her left thigh. She states the area may be slightly larger where she notes these symptoms anteriorly and somewhat laterally. She continues to walk regularly, and reports this doesn't prevent her from doing so, but is concerned that this could worsen. She is not having falls, or inability to walk. Her strength is intact as best she can tell. She denies any bowel or bladder  disturbances, and denies abdominal pain, nausea or vomiting. She denies any new musculoskeletal or joint aches or pains, new skin lesions or concerns. A complete review of systems is obtained and is otherwise negative.   Past Medical History:  Past Medical History:  Diagnosis Date  . Endometrial cancer ([place]) 12/2007   s/p total  abdominal hysterectomy at [facility] - ovaries were also removed   . Family history of breast cancer   . History of breast cancer    right breast -- treated with lumpectomy and radiation therarpy, postoperatively with tamoxifen   . History of radiation therapy 10/22/2016 to 10/28/2016  . Hypercholesterolemia   . Hyperlipidemia   . Hypertension   . Palpitations   . Personal history of radiation therapy 2006  . PVC's (premature ventricular contractions)   . Radiation 07/11/14-08/12/14   left lumbar paraspinal area 55 gray  . S/P radiation therapy 10/28/11-12/05/11   5040 cGy left pelvis  . S/P radiation therapy    Intracavitary brachytherapy of uterus    Past Surgical History: Past Surgical History:  Procedure Laterality Date  . APPENDECTOMY    . BREAST LUMPECTOMY Right 2000  . FOOT SURGERY     RIGHT  . TONSILLECTOMY    . TOTAL ABDOMINAL HYSTERECTOMY W/ BILATERAL SALPINGOOPHORECTOMY      Social History:  Social History   Socioeconomic History  . Marital status: Married    Spouse name: [name]  . Number of children: 2  . Years of education: Not on file  . Highest education level: Not on file  Occupational History  . Occupation: retired  Scientific laboratory technician  . Financial resource strain: Not on file  . Food insecurity:    Worry: Not on file    Inability: Not on file  . Transportation needs:    Medical: Not on file    Non-medical: Not on file  Tobacco Use  . Smoking status: Former Smoker    Types: Cigarettes    Last attempt to quit: 05/13/1962    Years since quitting: 55.3  . Smokeless tobacco: Never Used  Substance and Sexual Activity  . Alcohol use: Yes    Comment: ocass  . Drug use: No  . Sexual activity: Never  Lifestyle  . Physical activity:    Days per week: Not on file    Minutes per session: Not on file  . Stress: Not on file  Relationships  . Social connections:    Talks on phone: Not on file    Gets together: Not on file    Attends religious service: Not on file     Active member of club or organization: Not on file    Attends meetings of clubs or organizations: Not on file    Relationship status: Not on file  . Intimate partner violence:    Fear of current or ex partner: Not on file    Emotionally abused: Not on file    Physically abused: Not on file    Forced sexual activity: Not on file  Other Topics Concern  . Not on file  Social History Narrative  . 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  Family History: Family History  Problem Relation Age of Onset  . Thrombosis Father        coronary thrombosis  . Hypertension Father   . Heart failure Mother   . Coronary artery disease  Brother   . Breast cancer Maternal Aunt        dx in her 15s  . Stroke Maternal Uncle   . Stroke Maternal Grandfather      ALLERGIES:  is allergic to ace inhibitors; codeine; demerol; and dilaudid [hydromorphone hcl].  Meds: Current Outpatient Medications  Medication Sig Dispense Refill  . amLODipine (NORVASC) 5 MG tablet Take 5 mg by mouth daily.    Marland Kitchen aspirin EC 81 MG tablet Take 81 mg by mouth daily.    Marland Kitchen atenolol (TENORMIN) 25 MG tablet Take 25 mg by mouth 2 (two) times daily.     . Biotin 5000 MCG TABS Take 5,000 mcg by mouth daily.    . cholecalciferol (VITAMIN D) 1000 UNITS tablet Take 1,000 Units by mouth every Monday, Wednesday, and Friday.    . Cyanocobalamin (VITAMIN B-12) 5000 MCG SUBL Place 1 tablet under the tongue daily.     Marland Kitchen ibuprofen (ADVIL,MOTRIN) 200 MG tablet Take 200-400 mg by mouth 2 (two) times daily as needed. Reported on 11/13/2015    . lidocaine-prilocaine (EMLA) cream Apply to [facility] cath 1-2 hours prior to access as directed 30 g 1  . LORazepam (ATIVAN) 0.5 MG tablet 1 tablet po 30 minutes prior to radiation or MRI 30 tablet 0  . Magnesium 250 MG TABS Take 250 mg by mouth every Monday, Wednesday, and Friday.     . Multiple  Vitamin (MULTIVITAMIN WITH MINERALS) TABS tablet Take 1 tablet by mouth daily.    . Omega-3 Fatty Acids (FISH OIL PO) Take 360 mg by mouth daily.    . potassium gluconate 595 MG TABS tablet Take 595 mg by mouth every Monday, Wednesday, and Friday.    . simvastatin (ZOCOR) 20 MG tablet Take 20 mg by mouth daily.    . naproxen (NAPROSYN) 500 MG tablet Take 1 tablet (500 mg total) by mouth daily as needed. (Patient not taking: Reported on 05/19/2017) 60 tablet 1   No current facility-administered medications for this encounter.     Physical Findings:  height is 4' 11" (1.499 m) and weight is 106 lb 12.8 oz (48.4 kg). Her oral temperature is 98.6 F (37 C). Her blood pressure is 143/69 (abnormal) and her pulse is 65. Her respiration is 16 and oxygen saturation is 100%.  Pain Assessment Pain Score: 0-No pain/10 In general this is a well appearing caucasian female in no acute distress. She's alert and oriented x4 and appropriate throughout the examination. Cardiopulmonary assessment is negative for acute distress and she exhibits normal effort. The lower extremities are negative for edema. She continues to demonstrate and show me the area she has intact sensation R>L. And of the left anterolateral thigh she has about 12 cm of an area where she notes less sensation. She has an unaltered gait.   Lab Findings: Lab Results  Component Value Date   WBC 4.7 05/09/2017   HGB 12.2 05/09/2017   HCT 36.9 05/09/2017   MCV 96.7 05/09/2017   PLT 180 05/09/2017     Radiographic Findings: Mr Lumbar Spine W Wo Contrast  Result Date: 08/14/2017 CLINICAL DATA:  Follow-up treated spine metastasis. EXAM: MRI LUMBAR SPINE WITHOUT AND WITH CONTRAST TECHNIQUE: Multiplanar and multiecho pulse sequences of the lumbar spine were obtained without and with intravenous contrast. Creatinine was obtained on site at [facility] at [address] Results: Creatinine 0.7 mg/dL. CONTRAST:  16m MULTIHANCE GADOBENATE  DIMEGLUMINE 529 MG/ML IV SOLN COMPARISON:  05/15/2017 FINDINGS: Segmentation:  Standard  Alignment:  Dextroscoliosis and L4-5 grade 1 anterolisthesis. Vertebrae: Prominent fatty marrow conversion at L3-L5, correlating with history of radiation. No evidence of fracture or osseous metastasis. Conus medullaris and cauda equina: Conus extends to the L1-2 level. Conus and cauda equina appear normal. Paraspinal and other soft tissues: Known left paraspinal metastasis centered at L3-4 with heterogeneous residual enhancement that is difficult to separate from the enhancing left psoas muscle that is atrophic. The mass at maximal measures 2 cm on axial slices, stable. There has been evolution of the tumor characteristics with more solid character as a central cleft of fluid signal has subsided. There is broad impingement on the exiting left L3 nerve root. Colonic diverticulosis. Asymmetric atrophy and T2 hyperintensity of the left psoas. Disc levels: T12- L1: Mild disc narrowing.  No visible impingement L1-L2: Asymmetric rightward disc collapse and far-lateral disc bulging/spurring. Asymmetric right facet hypertrophy. Moderate right foraminal and subarticular recess stenosis. L2-L3: Disc narrowing and endplate degeneration. No visible impingement L3-L4: Asymmetric leftward disc narrowing and bulging. Asymmetric left degenerative facet spurring. The left L3 nerve is involved by extraforaminal tumor. Left subarticular recess stenosis L4-L5: Advanced facet arthropathy with anterolisthesis. Disc narrowing and bulging greater towards the left. Left more than right foraminal and subarticular recess impingement. L5-S1:Degenerative facet spurring.  No herniation or impingement. IMPRESSION: 1. Treated left paraspinal mass centered at L3-4. Mass size is stable but there has been some evolution of tumor characteristics with less central fluid seen today. Recommend continued surveillance. 2. No evidence of untreated metastasis. 3.  Degenerative changes and related impingement are described above. Electronically Signed   By: Monte Fantasia M.D.   On: 08/14/2017 14:24   Mm Screening Breast Tomo Bilateral  Result Date: 08/13/2017 CLINICAL DATA:  Screening. EXAM: DIGITAL SCREENING BILATERAL MAMMOGRAM WITH TOMO AND CAD COMPARISON:  Previous exam(s). ACR Breast Density Category c: The breast tissue is heterogeneously dense, which may obscure small masses. FINDINGS: There are no findings suspicious for malignancy. Images were processed with CAD. IMPRESSION: No mammographic evidence of malignancy. A result letter of this screening mammogram will be mailed directly to the patient. RECOMMENDATION: Screening mammogram in one year. (Code:SM-B-01Y) BI-RADS CATEGORY  1: Negative. Electronically Signed   By: Lajean Manes M.D.   On: 08/13/2017 13:15    Impression/Plan: 1. 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  The patient appears to be doing very well and radiographically stable within the spine and paraspinal region, we discussed the recommendations from conference were to repeat and MRI in 3 month's time, and she will be due for complete spinal imaging in the summer. She will also plan to follow up with Dr. Alvy Bimler and Dr. [place] as she's due for restaging CT in June 2019. 2. Left thigh/knee neuropathy. The paitent has not noticed singificant improvement with Vitamin B6 and in fact her symptoms seem to be more noticeable during our discussion. Though she's able to continue with funcationality, she is interested in a second opinion on remedy for this. I will contact Dr. Vertell Limber to review her case. If he feels she would benefit from evaluation, we will coordinate this. Of note she has not tolerated gabapentin in the past for nerve pain due to degenerative changes.    Carola Rhine, PAC

## 2017-08-29 ENCOUNTER — Other Ambulatory Visit: Payer: Self-pay | Admitting: Radiation Therapy

## 2017-08-29 DIAGNOSIS — C7951 Secondary malignant neoplasm of bone: Secondary | ICD-10-CM

## 2017-08-29 DIAGNOSIS — C7952 Secondary malignant neoplasm of bone marrow: Principal | ICD-10-CM

## 2017-09-18 ENCOUNTER — Inpatient Hospital Stay: Payer: Medicare Other | Attending: Hematology and Oncology

## 2017-09-18 DIAGNOSIS — C541 Malignant neoplasm of endometrium: Secondary | ICD-10-CM | POA: Insufficient documentation

## 2017-09-18 DIAGNOSIS — Z853 Personal history of malignant neoplasm of breast: Secondary | ICD-10-CM

## 2017-09-18 DIAGNOSIS — C7951 Secondary malignant neoplasm of bone: Secondary | ICD-10-CM

## 2017-09-18 DIAGNOSIS — T451X5A Adverse effect of antineoplastic and immunosuppressive drugs, initial encounter: Secondary | ICD-10-CM

## 2017-09-18 DIAGNOSIS — Z452 Encounter for adjustment and management of vascular access device: Secondary | ICD-10-CM | POA: Diagnosis not present

## 2017-09-18 DIAGNOSIS — Z95828 Presence of other vascular implants and grafts: Secondary | ICD-10-CM

## 2017-09-18 DIAGNOSIS — D701 Agranulocytosis secondary to cancer chemotherapy: Secondary | ICD-10-CM

## 2017-09-18 MED ORDER — HEPARIN SOD (PORK) LOCK FLUSH 100 UNIT/ML IV SOLN
500.0000 [IU] | Freq: Once | INTRAVENOUS | Status: AC
  Administered 2017-09-18: 500 [IU]
  Filled 2017-09-18: qty 5

## 2017-09-18 MED ORDER — SODIUM CHLORIDE 0.9% FLUSH
10.0000 mL | Freq: Once | INTRAVENOUS | Status: AC
  Administered 2017-09-18: 10 mL
  Filled 2017-09-18: qty 10

## 2017-09-22 ENCOUNTER — Telehealth: Payer: Self-pay | Admitting: Radiation Therapy

## 2017-09-22 NOTE — Telephone Encounter (Signed)
I spoke with [name] to let her know that [name] received her note requesting follow-up about the numbness and tingling in her left leg. [name] did reach out to Dr. Vertell Limber requesting that he review her case to weigh in. [name] would like to know if Dr. Vertell Limber feels that [name]  would benefit from evaluation at his office for this or a referral to pain management. Of note, she has not tolerated gabapentin in the past for nerve pain due to degenerative changes.   Dr. Vertell Limber plans to get back with [name] about this in the next couple days. [name] was told that we are awaiting his feedback and that we will be back in touch as soon as we have received his recommendations. [name] was very understanding and appreciative for the call.     Mont Dutton R.T. (R) (T) Radiation Special Procedures [place] [phone] Office [phone] Pager [phone] Fax

## 2017-09-23 ENCOUNTER — Telehealth: Payer: Self-pay | Admitting: Radiation Oncology

## 2017-09-23 NOTE — Telephone Encounter (Signed)
LM for the patient to contact me regarding discussion with Dr. Vertell Limber.

## 2017-09-24 ENCOUNTER — Telehealth: Payer: Self-pay | Admitting: Radiation Oncology

## 2017-09-24 NOTE — Telephone Encounter (Signed)
I spoke with the patient and she is interested in meeting with Dr. Maryjean Ka to discuss neuropathy.

## 2017-10-09 ENCOUNTER — Telehealth: Payer: Self-pay | Admitting: *Deleted

## 2017-10-09 NOTE — Telephone Encounter (Signed)
Called and left the patient a message to call the office back. Need to schedule a follow up after her scan.

## 2017-10-09 NOTE — Telephone Encounter (Signed)
Caleld and left the patient a message with the appts for the week of June 17th.

## 2017-10-27 ENCOUNTER — Inpatient Hospital Stay: Payer: Medicare Other | Attending: Hematology and Oncology

## 2017-10-27 ENCOUNTER — Inpatient Hospital Stay: Payer: Medicare Other

## 2017-10-27 DIAGNOSIS — D701 Agranulocytosis secondary to cancer chemotherapy: Secondary | ICD-10-CM

## 2017-10-27 DIAGNOSIS — Z853 Personal history of malignant neoplasm of breast: Secondary | ICD-10-CM | POA: Diagnosis not present

## 2017-10-27 DIAGNOSIS — C541 Malignant neoplasm of endometrium: Secondary | ICD-10-CM | POA: Diagnosis not present

## 2017-10-27 DIAGNOSIS — C7951 Secondary malignant neoplasm of bone: Secondary | ICD-10-CM

## 2017-10-27 DIAGNOSIS — Z95828 Presence of other vascular implants and grafts: Secondary | ICD-10-CM

## 2017-10-27 DIAGNOSIS — T451X5A Adverse effect of antineoplastic and immunosuppressive drugs, initial encounter: Secondary | ICD-10-CM

## 2017-10-27 DIAGNOSIS — I10 Essential (primary) hypertension: Secondary | ICD-10-CM | POA: Diagnosis not present

## 2017-10-27 LAB — CBC WITH DIFFERENTIAL/PLATELET
Basophils Absolute: 0 10*3/uL (ref 0.0–0.1)
Basophils Relative: 0 %
Eosinophils Absolute: 0.3 10*3/uL (ref 0.0–0.5)
Eosinophils Relative: 4 %
HCT: 36.2 % (ref 34.8–46.6)
Hemoglobin: 12 g/dL (ref 11.6–15.9)
Lymphocytes Relative: 17 %
Lymphs Abs: 1.2 10*3/uL (ref 0.9–3.3)
MCH: 32.3 pg (ref 25.1–34.0)
MCHC: 33.1 g/dL (ref 31.5–36.0)
MCV: 97.6 fL (ref 79.5–101.0)
Monocytes Absolute: 0.9 10*3/uL (ref 0.1–0.9)
Monocytes Relative: 12 %
Neutro Abs: 4.7 10*3/uL (ref 1.5–6.5)
Neutrophils Relative %: 67 %
Platelets: 167 10*3/uL (ref 145–400)
RBC: 3.71 MIL/uL (ref 3.70–5.45)
RDW: 13.7 % (ref 11.2–14.5)
WBC: 7 10*3/uL (ref 3.9–10.3)

## 2017-10-27 LAB — COMPREHENSIVE METABOLIC PANEL
ALT: 23 U/L (ref 0–55)
AST: 42 U/L — ABNORMAL HIGH (ref 5–34)
Albumin: 3.8 g/dL (ref 3.5–5.0)
Alkaline Phosphatase: 49 U/L (ref 40–150)
Anion gap: 7 (ref 3–11)
BUN: 23 mg/dL (ref 7–26)
CO2: 27 mmol/L (ref 22–29)
Calcium: 9.3 mg/dL (ref 8.4–10.4)
Chloride: 105 mmol/L (ref 98–109)
Creatinine, Ser: 0.75 mg/dL (ref 0.60–1.10)
GFR calc Af Amer: >60 mL/min (ref >60)
GFR calc non Af Amer: >60 mL/min (ref >60)
Glucose, Bld: 88 mg/dL (ref 70–140)
Potassium: 3.9 mmol/L (ref 3.5–5.1)
Sodium: 139 mmol/L (ref 136–145)
Total Bilirubin: 0.8 mg/dL (ref 0.2–1.2)
Total Protein: 6.6 g/dL (ref 6.4–8.3)

## 2017-10-27 MED ORDER — HEPARIN SOD (PORK) LOCK FLUSH 100 UNIT/ML IV SOLN
500.0000 [IU] | Freq: Once | INTRAVENOUS | Status: AC
  Administered 2017-10-27: 500 [IU]
  Filled 2017-10-27: qty 5

## 2017-10-27 MED ORDER — SODIUM CHLORIDE 0.9% FLUSH
10.0000 mL | Freq: Once | INTRAVENOUS | Status: AC
  Administered 2017-10-27: 10 mL
  Filled 2017-10-27: qty 10

## 2017-10-28 ENCOUNTER — Ambulatory Visit (HOSPITAL_COMMUNITY)
Admission: RE | Admit: 2017-10-28 | Discharge: 2017-10-28 | Disposition: A | Discharge status: Home / Self Care | Payer: Medicare Other | Source: Ambulatory Visit | Attending: Gynecologic Oncology | Admitting: Gynecologic Oncology

## 2017-10-28 DIAGNOSIS — K8020 Calculus of gallbladder without cholecystitis without obstruction: Secondary | ICD-10-CM | POA: Insufficient documentation

## 2017-10-28 DIAGNOSIS — Z08 Encounter for follow-up examination after completed treatment for malignant neoplasm: Secondary | ICD-10-CM | POA: Insufficient documentation

## 2017-10-28 DIAGNOSIS — C541 Malignant neoplasm of endometrium: Secondary | ICD-10-CM

## 2017-10-28 DIAGNOSIS — Z8542 Personal history of malignant neoplasm of other parts of uterus: Secondary | ICD-10-CM | POA: Diagnosis not present

## 2017-10-28 DIAGNOSIS — K5730 Diverticulosis of large intestine without perforation or abscess without bleeding: Secondary | ICD-10-CM | POA: Diagnosis not present

## 2017-10-28 DIAGNOSIS — R1900 Intra-abdominal and pelvic swelling, mass and lump, unspecified site: Secondary | ICD-10-CM | POA: Diagnosis not present

## 2017-10-28 MED ORDER — IOPAMIDOL (ISOVUE-300) INJECTION 61%
100.0000 mL | Freq: Once | INTRAVENOUS | Status: AC | PRN
  Administered 2017-10-28: 100 mL via INTRAVENOUS

## 2017-10-28 MED ORDER — IOPAMIDOL (ISOVUE-300) INJECTION 61%
INTRAVENOUS | Status: AC
  Filled 2017-10-28: qty 100

## 2017-10-29 DIAGNOSIS — M4126 Other idiopathic scoliosis, lumbar region: Secondary | ICD-10-CM | POA: Diagnosis not present

## 2017-10-29 DIAGNOSIS — C7951 Secondary malignant neoplasm of bone: Secondary | ICD-10-CM | POA: Diagnosis not present

## 2017-10-31 ENCOUNTER — Inpatient Hospital Stay (HOSPITAL_BASED_OUTPATIENT_CLINIC_OR_DEPARTMENT_OTHER): Payer: Medicare Other | Admitting: Hematology and Oncology

## 2017-10-31 ENCOUNTER — Telehealth: Payer: Self-pay | Admitting: Hematology and Oncology

## 2017-10-31 ENCOUNTER — Encounter: Payer: Self-pay | Admitting: Hematology and Oncology

## 2017-10-31 DIAGNOSIS — Z853 Personal history of malignant neoplasm of breast: Secondary | ICD-10-CM | POA: Diagnosis not present

## 2017-10-31 DIAGNOSIS — I10 Essential (primary) hypertension: Secondary | ICD-10-CM | POA: Diagnosis not present

## 2017-10-31 DIAGNOSIS — C7951 Secondary malignant neoplasm of bone: Secondary | ICD-10-CM | POA: Diagnosis not present

## 2017-10-31 DIAGNOSIS — C541 Malignant neoplasm of endometrium: Secondary | ICD-10-CM | POA: Diagnosis not present

## 2017-10-31 NOTE — Assessment & Plan Note (Addendum)
She has remote history of breast cancer She will continue screening mammogram Recent screening mammogram from April 2019 showed no evidence of disease.

## 2017-10-31 NOTE — Assessment & Plan Note (Addendum)
She has no signs of cancer recurrence I recommend calcium with vitamin D supplement to prevent severe osteoporosis We also discussed strength training exercises She will continue close follow-up with radiation oncologist for follow-up MRI imaging

## 2017-10-31 NOTE — Telephone Encounter (Signed)
Gave avs and calendar patient is out of town in june

## 2017-10-31 NOTE — Assessment & Plan Note (Addendum)
I have review of CT imaging  She has no signs of cancer recurrence We discussed GYN oncology follow-up In the meantime, she is interested to maintain port patency I will see her once a year with history, physical examination and blood work I will do periodic imaging study as needed

## 2017-10-31 NOTE — Progress Notes (Signed)
[name] OFFICE PROGRESS NOTE  Patient Care Team: Kelly Baton, MD as PCP - General (Internal Medicine) Kelly Lark, MD as Consulting Physician (Hematology and Oncology) Kelly Lark, MD as Consulting Physician (Hematology and Oncology) Kelly Marus, MD as Attending Physician (Gynecologic Oncology)  ASSESSMENT & PLAN:  Endometrial cancer [facility]) I have review of CT imaging  She has no signs of cancer recurrence We discussed GYN oncology follow-up In the meantime, she is interested to maintain port patency I will see her once a year with history, physical examination and blood work I will do periodic imaging study as needed  History of breast cancer She has remote history of breast cancer She will continue screening mammogram Recent screening mammogram from April 2019 showed no evidence of disease.  Metastatic cancer to bone [facility]) She has no signs of cancer recurrence I recommend calcium with vitamin D supplement to prevent severe osteoporosis We also discussed strength training exercises She will continue close follow-up with radiation oncologist for follow-up MRI imaging  White coat syndrome with diagnosis of hypertension She has whitecoat hypertension Her blood pressure control at home was satisfactory I recommend she continues the same   No orders of the defined types were placed in this encounter.   INTERVAL HISTORY: Please see below for problem oriented charting. She returns with her husband for further follow-up She feels well She has occasional rare back pain No recent abnormal nausea, weight loss, changes in bowel habits or vaginal bleeding She denies any recent abnormal breast examination, palpable mass, abnormal breast appearance or nipple changes  SUMMARY OF ONCOLOGIC HISTORY: Oncology History   Biopsy of recurrence 06-2014 ER negative. PDL1 testing low at 2% HER 2 negative on pathology [id number] Negative genetic testing     Malignant  neoplasm of corpus uteri, except isthmus ([place])   12/22/2007 Surgery    Staging for IA UPSC       - 05/28/2008 Chemotherapy    6 cycles of paclitaxel and carboplatin with vaginal cuff brachytherapy      03/22/2011 Relapse/Recurrence    Left pelvic node recurrence      04/25/2011 Surgery    L/S evaluation, not resectable.      05/16/2011 - 09/25/2011 Chemotherapy    6 cyclces paclitaxel and carboplatin       - 12/27/2011 Radiation Therapy    radiation completed      03/10/2013 Progression    progression left pelvic mass      04/02/2013 Surgery    Resection and IORT      06/13/2014 Relapse/Recurrence    soft tissue mass at L3-4      07/11/2014 - 08/12/2014 Radiation Therapy    IMRT + zometa      02/12/2016 Progression          - 01/18/2016 Chemotherapy    The patient had ondansetron (ZOFRAN) IVPB 16 mg, 16 mg (100 % of original dose 16 mg), Intravenous,  Once, 5 of 5 cycles Dose modification: 16 mg (original dose 16 mg, Cycle 2)  CARBOplatin (PARAPLATIN) 390 mg in sodium chloride 0.9 % 100 mL chemo infusion, 390 mg (100 % of original dose 392 mg), Intravenous,  Once, 5 of 5 cycles Dose modification: 392 mg (original dose 392 mg, Cycle 1), 446.5 mg (original dose 392 mg, Cycle 2), 390 mg (original dose 392 mg, Cycle 2), 342 mg (original dose 392 mg, Cycle 6)  CARBOplatin chemo intradermal Test Dose 100 mcg, 0.1 mg, Intradermal,  Once, 5 of 5 cycles  PACLitaxel (TAXOL) 252 mg in dextrose 5 % 250 mL chemo infusion (> 60m/m2), 175 mg/m2 = 252 mg, Intravenous,  Once, 6 of 6 cycles Dose modification: 135 mg/m2 (original dose 175 mg/m2, Cycle 5)  CARBOplatin (PARAPLATIN) 250 mg in sodium chloride 0.9 % 100 mL chemo infusion, 250 mg (100 % of original dose 254.4 mg), Intravenous,  Once, 6 of 6 cycles Dose modification:   (original dose 254.4 mg, Cycle 1)  PACLitaxel (TAXOL) 114 mg in dextrose 5 % 250 mL chemo infusion ( ondansetron (ZOFRAN) 16 mg, dexamethasone (DECADRON) 20 mg in  sodium chloride 0.9 % 50 mL IVPB, , Intravenous,  Once, 6 of 6 cycles  for chemotherapy treatment.         Endometrial cancer ([facility] [place]   12/01/2007 Initial Diagnosis    She has recurrent papillary serous endometrial carcinoma. Briefly she was diagnosed in 2009 with a FIGO IB pappilary serous carcinoma treated with hysterectomy followed by adjuvant chemo and vaginal brachytherapy. She then had a recurrence diagnosed in late 2012 that was deemed not to be surgically resectable, and was treated with 6 cycles of carbo/taxol followed by 5040 cGy of EBRT to the pelvic mass. She was then followed with serial imaging and was noted in June of this year to have slight increase in the size of the mass, and again in September there was small enlargement of the mass. She had a re-staging PET scan on 03/10/13 that showed FDG activity in the pelvic mass, with no other areas of disease      12/01/2007 Imaging    CT scan of chest, abdomen and pelvis: 1.  Mass like area of decreased attenuation in the central uterus, consistent with the known endometrial carcinoma.  Deep myometrial invasion is suspected.  Pelvic MRI without and with contrast could be performed for further staging workup if clinically warranted. 2.  No evidence of extrauterine extension or lymphadenopathy. 3.  Sigmoid diverticulosis incidentally noted.      12/15/2007 Surgery    --12/2007 laparoscopic hysterectomy and PLND --05/2008 complted 6 cycles adjuvant carbo/taxol followed by vaginal cuff brachy --03/2011 exploratory lararoscopy, ureterolysis --4/13 completed 6 cycels carbotaxol --8/13 completed EBRT to the left pelvic sidewall       06/10/2008 Imaging    CT scan abdomen and pelvis 1.  Nonspecific mildly prominent inguinal lymph nodes, right greater than left. 2.  Interval hysterectomy without evidence of recurrent pelvic mass or fluid collection.      03/22/2011 Imaging    Ct scan abdomen and pelvis 1.  Local uterine cancer recurrence  with two large nodes along the left pelvic sidewall. 2.  No evidence of bowel obstruction, urinary obstruction, or more distant metastasis.      03/31/2011 Relapse/Recurrence    chemotherapy and external beam radiation      05/16/2011 - 09/17/2011 Chemotherapy    She had 6 cycles of carboplatin and Taxol       07/17/2011 Imaging    Ct scan abdomen and pelvis 1.  Interval improvement in the previously demonstrated left pelvic local recurrence of tumor. 2.  No disease progression or complication identified. 3.  Mild bladder wall thickening on the right, nonspecific and possibly related to incomplete distension and/or radiation therapy. 4.  Cholelithiasis      10/11/2011 Imaging    CT scan abdomen and pelvis 1.  Interval enlargement of the left pelvic sidewall masses. 2.  No new nodular disease in the pelvis or lymphadenopathy. 3.  No evidence  of distant  metastasis. 4.  Mild hydronephrosis on the right is similar to prior. 5.  Focal thickening of the right aspect the bladder is stable compared to prior.       10/28/2011 - 12/05/2011 Radiation Therapy    radiation for pelvic sidewall recurrence      10/28/2011 - 12/05/2011 Radiation Therapy    10/28/11-12/05/11: Radiotherapy to the left pelvic sidewall      03/18/2012 Imaging    CT abdomen 1.  Today's study demonstrates a positive response to therapy with decreased size of left pelvic sidewall nodal masses, as detailed above.  No new soft tissue masses or new lymphadenopathy is identified within the abdomen or pelvis. Continue attention on follow-up studies is recommended. 2.  Cholelithiasis without findings to suggest acute cholecystitis. 3.  Colonic diverticulosis without findings to suggest acute diverticulitis at this time. 4.  Mild asymmetric urinary bladder wall thickening is unchanged compared to prior examinations and is nonspecific.  No definite bladder wall mass is identified at this time. 5.  Additional findings, similar to  prior examinations, as above      06/22/2012 Imaging    CT abdomen 1.  Further decreased size of the left pelvic peripherally enhancing lesion. 2.  No new sites of new or progressive disease. 3. Esophageal air fluid level suggests dysmotility or gastroesophageal reflux. 4.  Cholelithiasis      11/02/2012 Imaging    CT abdomen 1.  The left pelvic side wall lesion demonstrates mild increase in size from previous exam. 2.  No new sites of new or progressive disease. 3.  Cholelithiasis.       01/28/2013 Imaging    CT abdomen 1. Interval small increase in volume of enhancing mass adjacent to the left pelvic sidewall. 2. No evidence of abdominal or pelvic lymphadenopathy      04/02/2013 Surgery    pelvic mass resection with IORT      04/02/2013 - 04/02/2013 Radiation Therapy    04/02/13: Intraoperative radiotherapy to the left pelvis      05/10/2013 PET scan    1. Hypermetabolic mass in the deep left pelvis consistent with metastasis. 2. No additional evidence of local metastasis. No evidence of distant metastasis. 3. Small right lower lobe pulmonary nodule is not hypermetabolic.      07/27/2013 Imaging    No evidence of recurrent or metastatic disease. Apparent prior resection of the deep left pelvic metastasis.      02/08/2014 Imaging    No CT findings for recurrent or metastatic disease involving the abdomen/ pelvis      06/13/2014 Relapse/Recurrence    + recurrence paraspinous muscle      06/21/2014 Procedure    There is a soft tissue lesion along the left side of L3-L4. This lesion roughly measures up to 2.2 cm. Needle was positioned along the posterior aspect of the lesion. Small amount of air in the paraspinal tissues following needle removal      06/21/2014 Pathology Results    Bone, biopsy, left lumbar paraspinal - METASTATIC POORLY DIFFERENTIATED CARCINOMA, CONSISTENT WITH HIGH GRADE SEROUS CARCINOMA SEE COMMENT. Microscopic Comment The carcinoma demonstrates the  following immunophenotype: Cytokeratin 7 - patchy moderate to strong expression Estrogen receptor - negative expression P53 - strong diffuse expression TTF-1 - negative expression WT-1 - negative expression CD56 - focal moderate strong expression Synaptophysin - negative expression GCDFP - negative expression The history of primary endometrial papillary serous carcinoma and primary mammary carcinoma is noted. In the current case, the overall morphologic and  immunophenotype are that of poorly differentiated carcinoma, consistent with high grade serous carcinoma.       07/11/2014 - 08/12/2014 Radiation Therapy    07/11/14-08/12/14: 55 Gy in 25 fractions to the Lumbar spine      08/15/2014 Imaging    CT scan of chest, abdomen and pelvis 1. Since the biopsy study of 06/21/2014, similar to slight decrease in size of a left paraspinous lesion at L3-4. 2. No new sites of metastatic disease identified. 3. 8 mm ground-glass nodule in the right lower lobe is grossly similar to 03/10/2013, suggesting a benign etiology. 4. Cholelithiasis. 5. Apparent sigmoid colonic wall thickening which could be due to underdistention. Colitis felt less likely. 6.  Atherosclerosis, including within the coronary arteries. 7. Pelvic floor laxity.      10/07/2014 Imaging    CT scan of chest, abdomen and pelvis: Stable to slight interval decrease in size of left paraspinous lesion at L3-4. No new sites of metastatic disease. Unchanged ground-glass nodule within the right lower lobe, potentially benign in etiology. Recommend attention on followup. Cholelithiasis. Sigmoid colonic diverticulosis. No CT evidence to suggest acute diverticulitis.      01/19/2015 Imaging    Ct scan of abdomen and pelvis 1. Decrease in size of left paraspinous metastasis. 2. No new sites of disease. 3. Stable ground-glass attenuating nodule within the superior segment of right lower lobe. 4. Gallstones      05/02/2015 Imaging    Ct abdomen  and pelvis: Slight interval increase in size of left lumbar paraspinal soft tissue metastasis. No new sites of metastatic disease identified within the abdomen or pelvis.       05/11/2015 PET scan    1. The small soft tissue mass just lateral to the left L3 for neural foramen is hypermetabolic, favoring malignancy. 2. There is also a small focus of hypermetabolic activity between the left L1 and L2 transverse processes, but without a CT correlate. 3. The 13 mm sub solid nodule in the superior segment right lower lobe is stable from recent exams but has slowly increased in size over the last 7 years. Although not hypermetabolic, the appearance is concerning for the possibility of a low grade adenocarcinoma. 4. Other imaging findings of potential clinical significance: Chronic bilateral maxillary sinusitis. Coronary, aortic arch, and branch vessel atherosclerotic vascular disease. Aortoiliac atherosclerotic vascular disease. Cholelithiasis. Sigmoid colon diverticulosis. Lumbar scoliosis.      07/11/2015 Imaging    Ct scan of chest, abdomen and pelvis: 1. 13 mm mixed solid and sub solid nodule in the posterior right lower lobe was present in 2009 and has clearly progressed in the interval since that study. Imaging features remain highly concerning for low-grade or well differentiated adenocarcinoma. 2. Slight increase size of in the hypermetabolic, rim enhancing nodule identified adjacent to the left L3-4 neural foramen. Metastatic disease remains a concern. 3. No evidence for discrete soft tissue lesion between the left L1 and 2 transverse processes, the site of focal FDG uptake on the recent PET-CT. 4. Cholelithiasis. 5. Abdominal aortic atherosclerosis      08/03/2015 - 10/05/2015 Chemotherapy    She received weekly carboplatin and Taxol      11/06/2015 PET scan    1. Hypermetabolic left H0-8 paraspinous metastasis (biopsy-proven) is stable in size and slightly decreased in metabolism. 2.  Previously described small focus of hypermetabolism between the left L2 and L3 spinous processes has resolved. 3. New mild linear hypermetabolism to the left of the T11-12 spinous processes without discrete mass  on the CT images, favor benign activity related activity, recommend attention on follow-up PET-CT.  4. No definite new sites of hypermetabolic metastatic disease. No recurrent hypermetabolic metastatic disease in the pelvis. 5. Interval stability of subsolid 1.3 cm superior segment right lower lobe pulmonary nodule without associated significant metabolism, which has grown compared to the 2009 chest CT study, and remain suspicious for low grade adenocarcinoma. 6. Additional findings include aortic atherosclerosis, coronary atherosclerosis, mild multinodular goiter with no hypermetabolic thyroid nodules, cholelithiasis and moderate sigmoid diverticulosis.      02/12/2016 PET scan    1. Interval increase in size and metabolic activity of the LEFT paraspinal soft tissue metastasis. 2. New activity within the musculature of the LEFT chest wall. Given the unusual location of the paraspinal metastasis cannot exclude a second soft tissue metastasis to the musculature however favor benign physiologic activity. 3. Stable RIGHT lower lobe pulmonary nodule. 4. No evidence of local recurrence.        04/08/2016 Imaging    Ct scan of chest, abdomen and pelvis: 1. Similar size of a  left paravertebral abdominal lesion. 2. No new or progressive metastatic disease. 3. No correlate for the muscular activity about the lateral left chest wall. 4.  Coronary artery atherosclerosis. Aortic atherosclerosis. 5. Cholelithiasis. 6. Similar right lower lobe pulmonary nodule.      06/03/2016 Imaging    CT abdomen and pelvis 1. Similar to mild enlargement of a paravertebral soft tissue lesion at the L3-4 level. 2. No new sites of disease identified. 3. Cholelithiasis. 4. Hysterectomy. 5.  Aortic  atherosclerosis.       09/02/2016 Imaging    CT abdomen and pelvis 1. Continue mild interval increase in size of LEFT paraspinal mass (1-2 mm). 2. No evidence of new metastatic disease in the abdomen pelvis. 3. Post hysterectomy anatomy      09/26/2016 Imaging    MR lumbar spine 1. Left paraspinal metastasis with epicenter adjacent to the left L3 neural foramen does extend into the foramen to the level of the left lateral epidural space (series 9, image 31), but also tracks cephalad and caudal along the L2-L4 lumbar plexus (series 10, image 15). No extension into the left L2 or L4 neural foramina. And no more distal extension of tumor. 2. Abnormal signal in the medial left psoas and ventral left erector spinae muscles at L2 and L3 is probably denervation related. 3. No bone invasion or osseous metastatic disease. Suspect previous radiation of the L2 through L5 spinal levels. 4. No dural or intradural metastatic disease.       10/22/2016 Procedure    She underwent stereotactic Radiosurgery      10/30/2016 Genetic Testing    Patient has genetic testing done for Inheritable genetic mutation panel. Results revealed patient has no actionable mutation.      01/21/2017 Imaging    1. Reduced size and conspicuity of the left paraspinal mass at the L3-4 level. The mass still present but has enhancement similar to that of adjacent psoas musculature. 2. No new metastatic disease is identified. 3. Other imaging findings of potential clinical significance: Cholelithiasis. Aortic Atherosclerosis (ICD10-I70.0). Sigmoid diverticulosis. Lumbar scoliosis.      05/09/2017 Imaging    Ct scan of abdomen and pelvis 1. Paraspinal mass adjacent to the left side of L3-L4 is less distinct than prior examinations, and is therefore difficult to discretely measure, however, the overall appearance suggests continued positive response to therapy. 2. No new signs of metastatic disease elsewhere in the  abdomen or  pelvis. 3. Aortic atherosclerosis, in addition to at least right coronary artery disease. Assessment for potential risk factor modification, dietary therapy or pharmacologic therapy may be warranted, if clinically indicated. 4. Colonic diverticulosis without evidence of acute diverticulitis at this time. 5. Additional incidental findings, as above. Aortic Atherosclerosis (ICD10-I70.0).      08/14/2017 Imaging    1. Treated left paraspinal mass centered at L3-4. Mass size is stable but there has been some evolution of tumor characteristics with less central fluid seen today. Recommend continued surveillance. 2. No evidence of untreated metastasis. 3. Degenerative changes and related impingement are described above.      10/28/2017 Imaging    Stable small left paraspinal soft tissue mass. No new or progressive disease within the abdomen or pelvis.  Cholelithiasis.  No radiographic evidence of cholecystitis.  Colonic diverticulosis, without radiographic evidence of diverticulitis.       Metastatic cancer to bone ([place])   06/27/2014 Initial Diagnosis    Metastatic cancer to bone       REVIEW OF SYSTEMS:   Constitutional: Denies fevers, chills or abnormal weight loss Eyes: Denies blurriness of vision Ears, nose, mouth, throat, and face: Denies mucositis or sore throat Respiratory: Denies cough, dyspnea or wheezes Cardiovascular: Denies palpitation, chest discomfort or lower extremity swelling Gastrointestinal:  Denies nausea, heartburn or change in bowel habits Skin: Denies abnormal skin rashes Lymphatics: Denies new lymphadenopathy or easy bruising Neurological:Denies numbness, tingling or new weaknesses Behavioral/Psych: Mood is stable, no new changes  All other systems were reviewed with the patient and are negative.  I have reviewed the past medical history, past surgical history, social history and family history with the patient and they are unchanged from previous  note.  ALLERGIES:  is allergic to ace inhibitors; codeine; demerol; and dilaudid [hydromorphone hcl].  MEDICATIONS:  Current Outpatient Medications  Medication Sig Dispense Refill  . amLODipine (NORVASC) 5 MG tablet Take 5 mg by mouth daily.    Marland Kitchen atenolol (TENORMIN) 25 MG tablet Take 25 mg by mouth 2 (two) times daily.     . Biotin 5000 MCG TABS Take 5,000 mcg by mouth daily.    . cholecalciferol (VITAMIN D) 1000 UNITS tablet Take 1,000 Units by mouth every Monday, Wednesday, and Friday.    . Cyanocobalamin (VITAMIN B-12) 5000 MCG SUBL Place 1 tablet under the tongue daily.     Marland Kitchen ibuprofen (ADVIL,MOTRIN) 200 MG tablet Take 200-400 mg by mouth 2 (two) times daily as needed. Reported on 11/13/2015    . lidocaine-prilocaine (EMLA) cream Apply to [facility] cath 1-2 hours prior to access as directed 30 g 1  . LORazepam (ATIVAN) 0.5 MG tablet 1 tablet po 30 minutes prior to radiation or MRI 30 tablet 0  . Magnesium 250 MG TABS Take 250 mg by mouth every Monday, Wednesday, and Friday.     . Multiple Vitamin (MULTIVITAMIN WITH MINERALS) TABS tablet Take 1 tablet by mouth daily.    . naproxen (NAPROSYN) 500 MG tablet Take 1 tablet (500 mg total) by mouth daily as needed. (Patient not taking: Reported on 05/19/2017) 60 tablet 1  . potassium gluconate 595 MG TABS tablet Take 595 mg by mouth every Monday, Wednesday, and Friday.    . simvastatin (ZOCOR) 20 MG tablet Take 20 mg by mouth daily.     No current facility-administered medications for this visit.     PHYSICAL EXAMINATION: ECOG PERFORMANCE STATUS: 0 - Asymptomatic  Vitals:   10/31/17 1229  BP: (!) 174/83  Pulse: 70  Resp: 17  Temp: 98 F (36.7 C)  SpO2: 100%   Filed Weights   10/31/17 1229  Weight: 109 lb 12.8 oz (49.8 kg)    GENERAL:alert, no distress and comfortable SKIN: skin color, texture, turgor are normal, no rashes or significant lesions EYES: normal, Conjunctiva are pink and non-injected, sclera clear OROPHARYNX:no exudate,  no erythema and lips, buccal mucosa, and tongue normal  NECK: supple, thyroid normal size, non-tender, without nodularity LYMPH:  no palpable lymphadenopathy in the cervical, axillary or inguinal LUNGS: clear to auscultation and percussion with normal breathing effort HEART: regular rate & rhythm and no murmurs and no lower extremity edema ABDOMEN:abdomen soft, non-tender and normal bowel sounds Musculoskeletal:no cyanosis of digits and no clubbing  NEURO: alert & oriented x 3 with fluent speech, no focal motor/sensory deficits  LABORATORY DATA:  I have reviewed the data as listed    Component Value Date/Time   NA 139 10/27/2017 1356   NA 140 05/09/2017 0844   K 3.9 10/27/2017 1356   K 4.1 05/09/2017 0844   CL 105 10/27/2017 1356   CO2 27 10/27/2017 1356   CO2 26 05/09/2017 0844   GLUCOSE 88 10/27/2017 1356   GLUCOSE 82 05/09/2017 0844   BUN 23 10/27/2017 1356   BUN 20.7 05/09/2017 0844   CREATININE 0.75 10/27/2017 1356   CREATININE 0.7 05/09/2017 0844   CALCIUM 9.3 10/27/2017 1356   CALCIUM 9.3 05/09/2017 0844   PROT 6.6 10/27/2017 1356   PROT 6.8 05/09/2017 0844   ALBUMIN 3.8 10/27/2017 1356   ALBUMIN 3.6 05/09/2017 0844   AST 42 (H) 10/27/2017 1356   AST 27 05/09/2017 0844   ALT 23 10/27/2017 1356   ALT 17 05/09/2017 0844   ALKPHOS 49 10/27/2017 1356   ALKPHOS 43 05/09/2017 0844   BILITOT 0.8 10/27/2017 1356   BILITOT 0.81 05/09/2017 0844   GFRNONAA >60 10/27/2017 1356   GFRAA >60 10/27/2017 1356    No results found for: SPEP, UPEP  Lab Results  Component Value Date   WBC 7.0 10/27/2017   NEUTROABS 4.7 10/27/2017   HGB 12.0 10/27/2017   HCT 36.2 10/27/2017   MCV 97.6 10/27/2017   PLT 167 10/27/2017      Chemistry      Component Value Date/Time   NA 139 10/27/2017 1356   NA 140 05/09/2017 0844   K 3.9 10/27/2017 1356   K 4.1 05/09/2017 0844   CL 105 10/27/2017 1356   CO2 27 10/27/2017 1356   CO2 26 05/09/2017 0844   BUN 23 10/27/2017 1356   BUN  20.7 05/09/2017 0844   CREATININE 0.75 10/27/2017 1356   CREATININE 0.7 05/09/2017 0844      Component Value Date/Time   CALCIUM 9.3 10/27/2017 1356   CALCIUM 9.3 05/09/2017 0844   ALKPHOS 49 10/27/2017 1356   ALKPHOS 43 05/09/2017 0844   AST 42 (H) 10/27/2017 1356   AST 27 05/09/2017 0844   ALT 23 10/27/2017 1356   ALT 17 05/09/2017 0844   BILITOT 0.8 10/27/2017 1356   BILITOT 0.81 05/09/2017 0844       RADIOGRAPHIC STUDIES: I have personally reviewed the radiological images as listed and agreed with the findings in the report. Ct Abdomen Pelvis W Contrast  Result Date: 10/28/2017 CLINICAL DATA:  Followup metastatic endometrial carcinoma. EXAM: CT ABDOMEN AND PELVIS WITH CONTRAST TECHNIQUE: Multidetector CT imaging of the abdomen and pelvis was performed using the standard protocol following bolus administration of intravenous contrast. CONTRAST:  16m ISOVUE-300 IOPAMIDOL (ISOVUE-300) INJECTION 61% COMPARISON:  05/09/2017 FINDINGS: Lower Chest: No acute findings. Hepatobiliary: No hepatic masses identified. Multiple small gallstones are again seen, without evidence of cholecystitis or biliary ductal dilatation. Pancreas:  No mass or inflammatory changes. Spleen: Within normal limits in size and appearance. Adrenals/Urinary Tract: No masses identified. No evidence of hydronephrosis. Unremarkable unopacified urinary bladder. Stomach/Bowel: No evidence of obstruction, inflammatory process or abnormal fluid collections. Diverticulosis is seen mainly involving the sigmoid colon, however there is no evidence of diverticulitis. Vascular/Lymphatic: No pathologically enlarged lymph nodes. No abdominal aortic aneurysm. Aortic atherosclerosis. Reproductive: Prior hysterectomy noted. Adnexal regions are unremarkable in appearance. Other: Small peripherally enhancing soft tissue mass is seen in the left paraspinal region at the level of L3-4. This measures 1.8 x 1.6 cm on image 32/series 2, without  significant change compared to prior study. Musculoskeletal:  No suspicious bone lesions identified. IMPRESSION: Stable small left paraspinal soft tissue mass. No new or progressive disease within the abdomen or pelvis. Cholelithiasis.  No radiographic evidence of cholecystitis. Colonic diverticulosis, without radiographic evidence of diverticulitis. Electronically Signed   By: JEarle GellM.D.   On: 10/28/2017 13:23    All questions were answered. The patient knows to call the clinic with any problems, questions or concerns. No barriers to learning was detected.  I spent 15 minutes counseling the patient face to face. The total time spent in the appointment was 20 minutes and more than 50% was on counseling and review of test results  NHeath Lark MD 10/31/2017 1:15 PM

## 2017-10-31 NOTE — Assessment & Plan Note (Signed)
She has whitecoat hypertension Her blood pressure control at home was satisfactory I recommend she continues the same

## 2017-11-03 ENCOUNTER — Telehealth: Payer: Self-pay | Admitting: *Deleted

## 2017-11-03 NOTE — Telephone Encounter (Signed)
Called and spoke with the patient, scheduled a follow up appt for July 29th

## 2017-11-17 NOTE — Progress Notes (Signed)
[name] 77 yo. 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  Pain:     Walking and going to the gym doing what she wants to do overall. Fatigue:No Appetite:Good Bowel/Bladder issues:No change Nausea:No Dizziness:No Weight: Wt Readings from Last 3 Encounters:  11/24/17 110 lb 8 oz (50.1 kg)  10/31/17 109 lb 12.8 oz (49.8 kg)  08/18/17 106 lb 12.8 oz (48.4 kg)  BP (!) 160/74 (BP Location: Right Arm, Patient Position: Sitting, Cuff Size: Small)   Pulse 68   Temp 98.5 F (36.9 C) (Oral)   Resp 16   Ht 4\' 11"  (1.499 m)   Wt 110 lb 8 oz (50.1 kg)   SpO2 100%   BMI 22.32 kg/m

## 2017-11-19 ENCOUNTER — Inpatient Hospital Stay: Payer: Medicare Other | Attending: Hematology and Oncology

## 2017-11-19 ENCOUNTER — Other Ambulatory Visit: Payer: Self-pay | Admitting: Radiation Oncology

## 2017-11-19 ENCOUNTER — Other Ambulatory Visit: Payer: Medicare Other

## 2017-11-19 ENCOUNTER — Ambulatory Visit
Admission: RE | Admit: 2017-11-19 | Discharge: 2017-11-19 | Disposition: A | Discharge status: Home / Self Care | Payer: Medicare Other | Source: Ambulatory Visit | Attending: Radiation Oncology | Admitting: Radiation Oncology

## 2017-11-19 DIAGNOSIS — C541 Malignant neoplasm of endometrium: Secondary | ICD-10-CM | POA: Insufficient documentation

## 2017-11-19 DIAGNOSIS — T451X5A Adverse effect of antineoplastic and immunosuppressive drugs, initial encounter: Secondary | ICD-10-CM

## 2017-11-19 DIAGNOSIS — Z923 Personal history of irradiation: Secondary | ICD-10-CM | POA: Insufficient documentation

## 2017-11-19 DIAGNOSIS — D701 Agranulocytosis secondary to cancer chemotherapy: Secondary | ICD-10-CM

## 2017-11-19 DIAGNOSIS — Z90710 Acquired absence of both cervix and uterus: Secondary | ICD-10-CM | POA: Insufficient documentation

## 2017-11-19 DIAGNOSIS — M47816 Spondylosis without myelopathy or radiculopathy, lumbar region: Secondary | ICD-10-CM | POA: Diagnosis not present

## 2017-11-19 DIAGNOSIS — C7951 Secondary malignant neoplasm of bone: Secondary | ICD-10-CM | POA: Insufficient documentation

## 2017-11-19 DIAGNOSIS — Z9221 Personal history of antineoplastic chemotherapy: Secondary | ICD-10-CM | POA: Diagnosis not present

## 2017-11-19 DIAGNOSIS — C7952 Secondary malignant neoplasm of bone marrow: Principal | ICD-10-CM

## 2017-11-19 DIAGNOSIS — M4802 Spinal stenosis, cervical region: Secondary | ICD-10-CM | POA: Diagnosis not present

## 2017-11-19 DIAGNOSIS — M47814 Spondylosis without myelopathy or radiculopathy, thoracic region: Secondary | ICD-10-CM | POA: Diagnosis not present

## 2017-11-19 DIAGNOSIS — Z452 Encounter for adjustment and management of vascular access device: Secondary | ICD-10-CM | POA: Diagnosis not present

## 2017-11-19 DIAGNOSIS — M47812 Spondylosis without myelopathy or radiculopathy, cervical region: Secondary | ICD-10-CM | POA: Diagnosis not present

## 2017-11-19 DIAGNOSIS — Z853 Personal history of malignant neoplasm of breast: Secondary | ICD-10-CM

## 2017-11-19 DIAGNOSIS — Z95828 Presence of other vascular implants and grafts: Secondary | ICD-10-CM

## 2017-11-19 DIAGNOSIS — M48061 Spinal stenosis, lumbar region without neurogenic claudication: Secondary | ICD-10-CM | POA: Diagnosis not present

## 2017-11-19 MED ORDER — GADOBENATE DIMEGLUMINE 529 MG/ML IV SOLN
9.0000 mL | Freq: Once | INTRAVENOUS | Status: AC | PRN
  Administered 2017-11-19: 9 mL via INTRAVENOUS

## 2017-11-19 MED ORDER — SODIUM CHLORIDE 0.9% FLUSH
10.0000 mL | Freq: Once | INTRAVENOUS | Status: AC
  Administered 2017-11-19: 10 mL
  Filled 2017-11-19: qty 10

## 2017-11-19 MED ORDER — HEPARIN SOD (PORK) LOCK FLUSH 100 UNIT/ML IV SOLN
500.0000 [IU] | Freq: Once | INTRAVENOUS | Status: AC
  Administered 2017-11-19: 500 [IU]
  Filled 2017-11-19: qty 5

## 2017-11-24 ENCOUNTER — Other Ambulatory Visit: Payer: Self-pay

## 2017-11-24 ENCOUNTER — Ambulatory Visit
Admission: RE | Admit: 2017-11-24 | Discharge: 2017-11-24 | Disposition: A | Discharge status: Home / Self Care | Payer: Medicare Other | Source: Ambulatory Visit | Attending: Radiation Oncology | Admitting: Radiation Oncology

## 2017-11-24 ENCOUNTER — Encounter: Payer: Self-pay | Admitting: Radiation Oncology

## 2017-11-24 VITALS — BP 160/74 | HR 68 | Temp 98.5°F | Resp 16 | Ht 59.0 in | Wt 110.5 lb

## 2017-11-24 DIAGNOSIS — Z853 Personal history of malignant neoplasm of breast: Secondary | ICD-10-CM | POA: Diagnosis not present

## 2017-11-24 DIAGNOSIS — E785 Hyperlipidemia, unspecified: Secondary | ICD-10-CM | POA: Diagnosis not present

## 2017-11-24 DIAGNOSIS — R590 Localized enlarged lymph nodes: Secondary | ICD-10-CM | POA: Insufficient documentation

## 2017-11-24 DIAGNOSIS — K8020 Calculus of gallbladder without cholecystitis without obstruction: Secondary | ICD-10-CM | POA: Insufficient documentation

## 2017-11-24 DIAGNOSIS — Z79899 Other long term (current) drug therapy: Secondary | ICD-10-CM | POA: Insufficient documentation

## 2017-11-24 DIAGNOSIS — Z8541 Personal history of malignant neoplasm of cervix uteri: Secondary | ICD-10-CM | POA: Diagnosis not present

## 2017-11-24 DIAGNOSIS — C7951 Secondary malignant neoplasm of bone: Secondary | ICD-10-CM | POA: Insufficient documentation

## 2017-11-24 DIAGNOSIS — Z9221 Personal history of antineoplastic chemotherapy: Secondary | ICD-10-CM | POA: Diagnosis not present

## 2017-11-24 DIAGNOSIS — Z923 Personal history of irradiation: Secondary | ICD-10-CM | POA: Insufficient documentation

## 2017-11-24 DIAGNOSIS — E7800 Pure hypercholesterolemia, unspecified: Secondary | ICD-10-CM | POA: Diagnosis not present

## 2017-11-24 DIAGNOSIS — K5730 Diverticulosis of large intestine without perforation or abscess without bleeding: Secondary | ICD-10-CM | POA: Diagnosis not present

## 2017-11-24 DIAGNOSIS — I700 Atherosclerosis of aorta: Secondary | ICD-10-CM | POA: Diagnosis not present

## 2017-11-24 DIAGNOSIS — I493 Ventricular premature depolarization: Secondary | ICD-10-CM | POA: Insufficient documentation

## 2017-11-24 DIAGNOSIS — C55 Malignant neoplasm of uterus, part unspecified: Secondary | ICD-10-CM | POA: Diagnosis not present

## 2017-11-24 DIAGNOSIS — Z87891 Personal history of nicotine dependence: Secondary | ICD-10-CM | POA: Insufficient documentation

## 2017-11-24 DIAGNOSIS — I10 Essential (primary) hypertension: Secondary | ICD-10-CM | POA: Insufficient documentation

## 2017-11-24 DIAGNOSIS — G909 Disorder of the autonomic nervous system, unspecified: Secondary | ICD-10-CM | POA: Diagnosis not present

## 2017-11-24 DIAGNOSIS — M48061 Spinal stenosis, lumbar region without neurogenic claudication: Secondary | ICD-10-CM | POA: Insufficient documentation

## 2017-11-24 DIAGNOSIS — Z08 Encounter for follow-up examination after completed treatment for malignant neoplasm: Secondary | ICD-10-CM | POA: Diagnosis not present

## 2017-11-24 DIAGNOSIS — Z90722 Acquired absence of ovaries, bilateral: Secondary | ICD-10-CM | POA: Diagnosis not present

## 2017-11-24 DIAGNOSIS — Z90710 Acquired absence of both cervix and uterus: Secondary | ICD-10-CM | POA: Insufficient documentation

## 2017-11-24 DIAGNOSIS — Z803 Family history of malignant neoplasm of breast: Secondary | ICD-10-CM | POA: Diagnosis not present

## 2017-11-24 DIAGNOSIS — C7952 Secondary malignant neoplasm of bone marrow: Secondary | ICD-10-CM

## 2017-11-24 DIAGNOSIS — G629 Polyneuropathy, unspecified: Secondary | ICD-10-CM | POA: Insufficient documentation

## 2017-11-24 DIAGNOSIS — Z85830 Personal history of malignant neoplasm of bone: Secondary | ICD-10-CM | POA: Diagnosis not present

## 2017-11-24 DIAGNOSIS — C549 Malignant neoplasm of corpus uteri, unspecified: Secondary | ICD-10-CM

## 2017-11-24 NOTE — Progress Notes (Signed)
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     Interval Since Last Radiation: 13 months  10/22/2016 to 10/28/2016:  The L-spine, specifically L3 was treated to 27 Gy in 3 fractions at 9 Gy per fraction.   07/11/14-08/12/14:  55 Gy in 25 fractions to the Lumbar spine  04/02/13:  Intraoperative radiotherapy to the left pelvis.  10/28/11-12/05/11: Radiotherapy to the left pelvic sidewall, details not available at time of dictation  2010:  Vaginal cuff brachytherapy, details of her dose are unknown  2000:  Adjuvant radiotherapy to the right breast, details are unknown  Narrative:   Kelly DRUMGOOLE is a 77 y.o.  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 The patient has a history of Stage I, T1N0, ER/PR positive, HER2 2+ ductal right breast cancer in 2000 which was treated with lumpectomy, sentinel lymph node assessment and tamoxifen. She was diagnosed with stage IB papillary serous endometrial carcinoma in August 2009, underwent radical hysterectomy with 15 negative lymph nodes however LVSI was noted. She completed 6 cycles of Taxol carboplatin in January 2010, followed by a vaginal cuff brachytherapy. Her uterine cancer recurred in the fall of 2012 in the pelvic sidewall. She continued chemotherapy, and proceeded with radiotherapy in 2013 to the site. She also developed disease in the pelvis, she subsequently underwent radical resection at [facility] in November 2014 with resection of pelvic mass, left ureteral lysis, intraoperative radiotherapy, omental J flap and left ureteral stent placement. She did well until 2016 when she had lumbar pain and  an MRI in an orthopedic office revealed a 2.7 x 2.4 x 1.9 cm mass at the L3-L4 location with left neural foramen involvement, and focal destruction of L3. A CT guided biopsy confirmed high grade serous carcinoma consistent with her uterine cancer. She received Radiotherapy to the lumbar spine, and began Zometa along with her systemic therapy. She had new pulmonary disease identified in the right lower lobe in January 2017, this was stabilized with chemotherapy, and her paraspinal mass was also relatively stable until her last scan on 09/02/16 measured this at 27 x 21 mm, previously 25 x 18 mm in January 2018. She did not have any other surgical options available, and underwent SRS to the paraspinal disease in June 2018. She continues in surveillance in the brain and spine oncology conference and had complete spine MRI on 11/19/17 which did not reveal any new disease, and her previously treated site in the L3 region appeared stable. Of note, Dr. Nevada Crane did discriminate that she has a 13th pair of ribs in the thorax. For the last 3 years, her scans have not specifically called this, but in review, he believes she has an additional thoracic vertebral body and has called this T13 on her last imaging. She had repeat staging on 10/28/17, which was negative for disease. She remains in surveillance with GYN Oncology and Dr. Alvy Bimler.  On review of systems, the patient reports that she is doing well overall. She reports that she continues to have discomfort along the left thigh and occasionally has some trouble with numbness that extends to her knee on the left. She denies any  chest pain, shortness of breath, cough, fevers, chills, night sweats, unintended weight changes. She denies any bowel or bladder disturbances, and denies abdominal pain, nausea or vomiting. She denies any new musculoskeletal or joint aches or pains, new skin lesions or concerns. A complete review of systems is obtained and is otherwise  negative.    Past Medical History:  Past Medical History:  Diagnosis Date  . Endometrial cancer ([place]) 12/2007   s/p total abdominal hysterectomy at [facility] - ovaries were also removed   . Family history of breast cancer   . History of breast cancer    right breast -- treated with lumpectomy and radiation therarpy, postoperatively with tamoxifen   . History of radiation therapy 10/22/2016 to 10/28/2016  . Hypercholesterolemia   . Hyperlipidemia   . Hypertension   . Palpitations   . Personal history of radiation therapy 2006  . PVC's (premature ventricular contractions)   . Radiation 07/11/14-08/12/14   left lumbar paraspinal area 55 gray  . S/P radiation therapy 10/28/11-12/05/11   5040 cGy left pelvis  . S/P radiation therapy    Intracavitary brachytherapy of uterus    Past Surgical History: Past Surgical History:  Procedure Laterality Date  . APPENDECTOMY    . BREAST LUMPECTOMY Right 2000  . FOOT SURGERY     RIGHT  . TONSILLECTOMY    . TOTAL ABDOMINAL HYSTERECTOMY W/ BILATERAL SALPINGOOPHORECTOMY      Social History:  Social History   Socioeconomic History  . Marital status: Married    Spouse name: [name]  . Number of children: 2  . Years of education: Not on file  . Highest education level: Not on file  Occupational History  . Occupation: retired  Scientific laboratory technician  . Financial resource strain: Not on file  . Food insecurity:    Worry: Not on file    Inability: Not on file  . Transportation needs:    Medical: Not on file    Non-medical: Not on file  Tobacco Use  . Smoking status: Former Smoker    Types: Cigarettes    Last attempt to quit: 05/13/1962    Years since quitting: 55.5  . Smokeless tobacco: Never Used  Substance and Sexual Activity  . Alcohol use: Yes    Comment: ocass  . Drug use: No  . Sexual activity: Never  Lifestyle  . Physical activity:    Days per week: Not on file    Minutes per session: Not on file  . Stress: Not on file  Relationships   . Social connections:    Talks on phone: Not on file    Gets together: Not on file    Attends religious service: Not on file    Active member of club or organization: Not on file    Attends meetings of clubs or organizations: Not on file    Relationship status: Not on file  . Intimate partner violence:    Fear of current or ex partner: Not on file    Emotionally abused: Not on file    Physically abused: Not on file    Forced sexual activity: Not on file  Other Topics Concern  . Not on file  Social History Narrative  . 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  Family History: Family History  Problem Relation Age of Onset  . Thrombosis Father  coronary thrombosis  . Hypertension Father   . Heart failure Mother   . Coronary artery disease Brother   . Breast cancer Maternal Aunt        dx in her 51s  . Stroke Maternal Uncle   . Stroke Maternal Grandfather      ALLERGIES:  is allergic to ace inhibitors; codeine; demerol; and dilaudid [hydromorphone hcl].  Meds: Current Outpatient Medications  Medication Sig Dispense Refill  . amLODipine (NORVASC) 5 MG tablet Take 5 mg by mouth daily.    Marland Kitchen atenolol (TENORMIN) 25 MG tablet Take 25 mg by mouth 2 (two) times daily.     . Biotin 5000 MCG TABS Take 5,000 mcg by mouth daily.    . cholecalciferol (VITAMIN D) 1000 UNITS tablet Take 1,000 Units by mouth every Monday, Wednesday, and Friday.    . Cyanocobalamin (VITAMIN B-12) 5000 MCG SUBL Place 1 tablet under the tongue daily.     Marland Kitchen lidocaine-prilocaine (EMLA) cream Apply to [facility] cath 1-2 hours prior to access as directed 30 g 1  . LORazepam (ATIVAN) 0.5 MG tablet 1 tablet po 30 minutes prior to radiation or MRI 30 tablet 0  . Magnesium 250 MG TABS Take 250 mg by mouth every Monday, Wednesday, and Friday.     . Multiple Vitamin (MULTIVITAMIN WITH MINERALS) TABS tablet Take  1 tablet by mouth daily.    . naproxen (NAPROSYN) 500 MG tablet Take 1 tablet (500 mg total) by mouth daily as needed. 60 tablet 1  . potassium gluconate 595 MG TABS tablet Take 595 mg by mouth every Monday, Wednesday, and Friday.    . simvastatin (ZOCOR) 20 MG tablet Take 20 mg by mouth daily.    Marland Kitchen ibuprofen (ADVIL,MOTRIN) 200 MG tablet Take 200-400 mg by mouth 2 (two) times daily as needed. Reported on 11/13/2015     No current facility-administered medications for this encounter.     Physical Findings:  height is '4\' 11"'$  (1.499 m) and weight is 110 lb 8 oz (50.1 kg). Her oral temperature is 98.5 F (36.9 C). Her blood pressure is 160/74 (abnormal) and her pulse is 68. Her respiration is 16 and oxygen saturation is 100%.  Pain Assessment Pain Score: 0-No pain/10 In general this is a well appearing caucasian female in no acute distress. She's alert and oriented x4 and appropriate throughout the examination. Cardiopulmonary assessment is negative for acute distress and she exhibits normal effort. The lower extremities are negative for edema. She continues to demonstrate and show me the area she has intact sensation R>L. And of the left anterolateral thigh she has about 12 cm of an area where she notes less sensation. She has an unaltered gait.   Lab Findings: Lab Results  Component Value Date   WBC 7.0 10/27/2017   HGB 12.0 10/27/2017   HCT 36.2 10/27/2017   MCV 97.6 10/27/2017   PLT 167 10/27/2017     Radiographic Findings: Ct Abdomen Pelvis W Contrast  Result Date: 10/28/2017 CLINICAL DATA:  Followup metastatic endometrial carcinoma. EXAM: CT ABDOMEN AND PELVIS WITH CONTRAST TECHNIQUE: Multidetector CT imaging of the abdomen and pelvis was performed using the standard protocol following bolus administration of intravenous contrast. CONTRAST:  128m ISOVUE-300 IOPAMIDOL (ISOVUE-300) INJECTION 61% COMPARISON:  05/09/2017 FINDINGS: Lower Chest: No acute findings. Hepatobiliary: No hepatic  masses identified. Multiple small gallstones are again seen, without evidence of cholecystitis or biliary ductal dilatation. Pancreas:  No mass or inflammatory changes. Spleen: Within normal limits in size and appearance.  Adrenals/Urinary Tract: No masses identified. No evidence of hydronephrosis. Unremarkable unopacified urinary bladder. Stomach/Bowel: No evidence of obstruction, inflammatory process or abnormal fluid collections. Diverticulosis is seen mainly involving the sigmoid colon, however there is no evidence of diverticulitis. Vascular/Lymphatic: No pathologically enlarged lymph nodes. No abdominal aortic aneurysm. Aortic atherosclerosis. Reproductive: Prior hysterectomy noted. Adnexal regions are unremarkable in appearance. Other: Small peripherally enhancing soft tissue mass is seen in the left paraspinal region at the level of L3-4. This measures 1.8 x 1.6 cm on image 32/series 2, without significant change compared to prior study. Musculoskeletal:  No suspicious bone lesions identified. IMPRESSION: Stable small left paraspinal soft tissue mass. No new or progressive disease within the abdomen or pelvis. Cholelithiasis.  No radiographic evidence of cholecystitis. Colonic diverticulosis, without radiographic evidence of diverticulitis. Electronically Signed   By: Earle Gell M.D.   On: 10/28/2017 13:23   Mr Total Spine Mets Screening  Result Date: 11/19/2017 CLINICAL DATA:  77 year old female with Papillary serous carcinoma of the uterus. Total lumbar spine radiation treatment in 2016, with dedicated additional 2018 radiation treatment to an L3 paraspinal mass. Restaging. EXAM: MRI TOTAL SPINE WITHOUT AND WITH CONTRAST TECHNIQUE: Multisequence MR imaging of the spine from the cervical spine to the sacrum was performed prior to and following IV contrast administration for evaluation of spinal metastatic disease. CONTRAST:  41m MULTIHANCE GADOBENATE DIMEGLUMINE 529 MG/ML IV SOLN COMPARISON:  Post  treatment lumbar MRIs 08/14/2017 and earlier. CT Abdomen and Pelvis 10/28/2017. PET-CT 02/12/2016 end earlier. FINDINGS: MRI CERVICAL SPINE FINDINGS Alignment: Straightening of cervical lordosis. Mild degenerative appearing anterolisthesis of C7 on T1. Vertebrae: Pronounced degenerative endplate marrow signal changes C3-C4 through C5-C6. Associated severe disc space loss and endplate spurring at those levels. No suspicious Cervical spine osseous lesion. Cord: No spinal cord signal abnormality despite degenerative cervical spinal stenosis with cord mass effect at C4-C5 and C5-C6. No dural thickening or abnormal intradural enhancement. Posterior Fossa, vertebral arteries, paraspinal tissues: There is a chronic right thyroid nodule which measures 12 x 17 millimeters now, estimated at 10 millimeters in 2017. This has a benign appearance. Otherwise negative neck and cervical paraspinal soft tissues. Disc levels: Advanced disc and endplate degeneration CI4-P8through C6-C7. Spinal stenosis with mild mass effect on the spinal cord at C4-C5 and C5-C6. Multilevel cervical foraminal stenosis. MRI THORACIC SPINE FINDINGS Segmentation: 12 full size pairs of ribs as depicted on the 2017 PET-CT. However, there are hypoplastic ribs at what otherwise appears to be the 1st lumbar level. Furthermore, designating the level with hypoplastic ribs as L1 differs from the lumbar numbering scheme on this series of exams since 2016. Therefore, an extra T 13 thoracic level is designated for this study. Correlation with radiographs is recommended prior to any operative intervention. Alignment: Chronic levoconvex lower thoracic scoliosis. Relatively preserved thoracic kyphosis. Vertebrae: Benign vertebral body hemangioma posteriorly in T8. Degenerative thoracic endplate marrow signal changes at L1-L2, associated with severe disc degeneration including vacuum disc. Degenerative appearing marrow edema at the and enhancement left T11 costovertebral  junction (series 24, image 16). Associated degenerative costovertebral spurring. No suspicious thoracic marrow lesion. Cord: Normal thoracic spinal cord signal. The conus is located at L2-L3. No abnormal intradural enhancement. No dural thickening. Paraspinal and other soft tissues: Negative visible thoracic and upper abdominal viscera. Negative thoracic paraspinal soft tissues. Disc levels: Severe chronic disc and endplate degeneration at T1-T2 with mild degenerative endplate edema. Small right paracentral disc herniation at T9-T10 effaces the right ventral CSF space, but results in no  significant spinal stenosis. MRI LUMBAR SPINE FINDINGS Segmentation: Abnormal is described in the thoracic section above. For continuity with the previous pre and post treatment spinal imaging, the lowest ribs which are hypoplastic are designated a T13 level. This results in normal lumbar segmentation, with the same lumbar numbering as on prior MRIs. Alignment: Chronic levoconvex upper lumbar and dextroconvex lower lumbar scoliosis. Superimposed chronic grade 1 anterolisthesis of L4 on L5 measuring 5-6 millimeters. Vertebrae: Chronically increased T1 bone marrow signal in the lumbar spine compatible with prior external radiation. Superimposed severe chronic degenerative right lateral L1-L2 endplate marrow changes including edema and enhancement. Vacuum disc here on the recent CT. Similar degenerative appearing marrow edema and enhancement in the right L5-S1 facets. No suspicious marrow changes at the left L3-L4 levels. The visible sacrum and SI joints appear within normal limits. Conus medullaris: Extends to the L1-L2 level and appears normal. Normal cauda equina nerve roots aside from degenerative spinal stenosis. No abnormal intradural enhancement. Paraspinal and other soft tissues: Left L3 neural foramina heterogeneously enhancing paraspinal mass now encompasses 23 by 25 x 25 millimeters (AP by transverse by CC) and appears stable  in configuration since April. The mass heterogeneously enhances as before, and the enhancement is inseparable from confluent edema and enhancement throughout the regional left psoas muscle (series 100, image 91) which has also not changed. The other paraspinal soft tissues and visible abdominal viscera are stable and within normal limits. Disc levels: Mild to moderate degenerative lumbar spinal stenosis at L1-L2, L3-L4 and L4-L5 is stable. IMPRESSION: 1. 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 To maintain continuity with the previous pre- and post-treatment lumbar imaging since 2016, a T13 thoracic level with hypoplastic ribs is now designated. Correlation with radiographs is recommended prior to any operative intervention. 2. Stable treated left L3 foraminal paraspinal mass since 08/14/2017. Stable post treatment enhancement pattern, and post treatment edema and enhancement of the adjacent left psoas muscle. 3. No vertebral or other spinal metastatic disease identified. 4. Multilevel advanced spinal degeneration in the setting of chronic scoliosis. - associated multilevel degenerative vertebral edema and enhancement in the cervical spine, cervicothoracic and thoracolumbar junctions, and the lumbar spine. - associated multilevel degenerative cervical and lumbar spinal stenosis. No spinal cord signal abnormality. Electronically Signed   By: Genevie Ann M.D.   On: 11/19/2017 13:49    Impression/Plan: 1. 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  The patient appears to be doing very well and radiographically stable within the spine and paraspinal region, we discussed the recommendations from conference were to repeat and MRI in 4 month's time, and she will be due for complete spinal imaging in the summer. She will also plan to follow up with Dr.  Alvy Bimler next summer, and Dr. Gerarda Fraction in about 2 weeks.  2. Left thigh/knee neuropathy. The paitent has been evaluated by Dr. Maryjean Ka, at this time they are going to follow her symptoms and only intervene with therapy if her symptoms interfere with her quality of life. Of note she has not tolerated gabapentin in the past for nerve pain due to degenerative changes.    Carola Rhine, PAC

## 2017-11-24 NOTE — Addendum Note (Signed)
Encounter addended by: Malena Edman, RN on: 11/24/2017 4:32 PM  Actions taken: Charge Capture section accepted

## 2017-12-04 DIAGNOSIS — C7951 Secondary malignant neoplasm of bone: Secondary | ICD-10-CM | POA: Diagnosis not present

## 2017-12-04 DIAGNOSIS — Z6822 Body mass index (BMI) 22.0-22.9, adult: Secondary | ICD-10-CM | POA: Diagnosis not present

## 2017-12-04 DIAGNOSIS — I10 Essential (primary) hypertension: Secondary | ICD-10-CM | POA: Diagnosis not present

## 2017-12-08 ENCOUNTER — Encounter: Payer: Self-pay | Admitting: Obstetrics

## 2017-12-08 ENCOUNTER — Inpatient Hospital Stay (HOSPITAL_BASED_OUTPATIENT_CLINIC_OR_DEPARTMENT_OTHER): Payer: Medicare Other | Admitting: Obstetrics

## 2017-12-08 VITALS — BP 162/73 | HR 65 | Temp 97.3°F | Resp 20 | Ht 59.0 in | Wt 109.3 lb

## 2017-12-08 DIAGNOSIS — Z9221 Personal history of antineoplastic chemotherapy: Secondary | ICD-10-CM

## 2017-12-08 DIAGNOSIS — C541 Malignant neoplasm of endometrium: Secondary | ICD-10-CM

## 2017-12-08 DIAGNOSIS — E7849 Other hyperlipidemia: Secondary | ICD-10-CM | POA: Diagnosis not present

## 2017-12-08 DIAGNOSIS — Z923 Personal history of irradiation: Secondary | ICD-10-CM

## 2017-12-08 DIAGNOSIS — Z90710 Acquired absence of both cervix and uterus: Secondary | ICD-10-CM

## 2017-12-08 DIAGNOSIS — I10 Essential (primary) hypertension: Secondary | ICD-10-CM | POA: Diagnosis not present

## 2017-12-08 DIAGNOSIS — M859 Disorder of bone density and structure, unspecified: Secondary | ICD-10-CM | POA: Diagnosis not present

## 2017-12-08 DIAGNOSIS — Z452 Encounter for adjustment and management of vascular access device: Secondary | ICD-10-CM | POA: Diagnosis not present

## 2017-12-08 DIAGNOSIS — R82998 Other abnormal findings in urine: Secondary | ICD-10-CM | POA: Diagnosis not present

## 2017-12-08 DIAGNOSIS — C7951 Secondary malignant neoplasm of bone: Secondary | ICD-10-CM | POA: Diagnosis not present

## 2017-12-08 NOTE — Patient Instructions (Signed)
Plan to follow up in six months with Dr. Gerarda Fraction or sooner if needed.  Please call the office at [phone] in October or November to schedule an appointment for January 2020.

## 2017-12-11 DIAGNOSIS — H40013 Open angle with borderline findings, low risk, bilateral: Secondary | ICD-10-CM | POA: Diagnosis not present

## 2017-12-11 DIAGNOSIS — H2513 Age-related nuclear cataract, bilateral: Secondary | ICD-10-CM | POA: Diagnosis not present

## 2017-12-11 DIAGNOSIS — Z6822 Body mass index (BMI) 22.0-22.9, adult: Secondary | ICD-10-CM | POA: Diagnosis not present

## 2017-12-11 DIAGNOSIS — D696 Thrombocytopenia, unspecified: Secondary | ICD-10-CM | POA: Diagnosis not present

## 2017-12-11 DIAGNOSIS — D72819 Decreased white blood cell count, unspecified: Secondary | ICD-10-CM | POA: Diagnosis not present

## 2017-12-11 DIAGNOSIS — I2510 Atherosclerotic heart disease of native coronary artery without angina pectoris: Secondary | ICD-10-CM | POA: Diagnosis not present

## 2017-12-11 DIAGNOSIS — G609 Hereditary and idiopathic neuropathy, unspecified: Secondary | ICD-10-CM | POA: Diagnosis not present

## 2017-12-11 DIAGNOSIS — H524 Presbyopia: Secondary | ICD-10-CM | POA: Diagnosis not present

## 2017-12-11 DIAGNOSIS — E7849 Other hyperlipidemia: Secondary | ICD-10-CM | POA: Diagnosis not present

## 2017-12-11 DIAGNOSIS — C50919 Malignant neoplasm of unspecified site of unspecified female breast: Secondary | ICD-10-CM | POA: Diagnosis not present

## 2017-12-11 DIAGNOSIS — Z1389 Encounter for screening for other disorder: Secondary | ICD-10-CM | POA: Diagnosis not present

## 2017-12-11 DIAGNOSIS — C55 Malignant neoplasm of uterus, part unspecified: Secondary | ICD-10-CM | POA: Diagnosis not present

## 2017-12-11 DIAGNOSIS — Z0000 Encounter for general adult medical examination without abnormal findings: Secondary | ICD-10-CM | POA: Diagnosis not present

## 2017-12-11 DIAGNOSIS — I10 Essential (primary) hypertension: Secondary | ICD-10-CM | POA: Diagnosis not present

## 2017-12-11 DIAGNOSIS — H5203 Hypermetropia, bilateral: Secondary | ICD-10-CM | POA: Diagnosis not present

## 2017-12-11 DIAGNOSIS — C7951 Secondary malignant neoplasm of bone: Secondary | ICD-10-CM | POA: Diagnosis not present

## 2017-12-17 DIAGNOSIS — Z1212 Encounter for screening for malignant neoplasm of rectum: Secondary | ICD-10-CM | POA: Diagnosis not present

## 2017-12-22 ENCOUNTER — Encounter: Payer: Self-pay | Admitting: Obstetrics

## 2017-12-22 NOTE — Progress Notes (Signed)
Follow Up Note: Gyn-Onc  [name] 76 y.o. female  CC:  Chief Complaint  Patient presents with  . Endometrial cancer (HCC)    HPI:  [name] is seen for continuing attention to her recurrent endometrial carcinoma,   Interval History No new complaints today . She is active and going to the Gym daily.  CT 10/28/2017 to review - FINDINGS: Lower Chest: No acute findings. Hepatobiliary: No hepatic masses identified. Multiple small gallstones are again seen, without evidence of cholecystitis or biliary ductal dilatation. Pancreas:  No mass or inflammatory changes. Spleen: Within normal limits in size and appearance. Adrenals/Urinary Tract: No masses identified. No evidence of hydronephrosis. Unremarkable unopacified urinary bladder. Stomach/Bowel: No evidence of obstruction, inflammatory process or abnormal fluid collections. Diverticulosis is seen mainly involving the sigmoid colon, however there is no evidence of diverticulitis. Vascular/Lymphatic: No pathologically enlarged lymph nodes. No abdominal aortic aneurysm. Aortic atherosclerosis. Reproductive: Prior hysterectomy noted. Adnexal regions are unremarkable in appearance. Other: Small peripherally enhancing soft tissue mass is seen in the left paraspinal region at the level of L3-4. This measures 1.8 x 1.6 cm on image 32/series 2, without significant change compared to prior study. Musculoskeletal:  No suspicious bone lesions identified. IMPRESSION: Stable small left paraspinal soft tissue mass. No new or progressive disease within the abdomen or pelvis. Cholelithiasis.  No radiographic evidence of cholecystitis.  Colonic diverticulosis, without radiographic evidence of diverticulitis.  11/19/17 - MRI spine IMPRESSION: 1. 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 To maintain continuity with the previous pre- and post-treatment lumbar imaging since 2016, a T13 thoracic level with  hypoplastic ribs is now designated. Correlation with radiographs is recommended prior to any operative intervention. 2. Stable treated left L3 foraminal paraspinal mass since 08/14/2017. Stable post treatment enhancement pattern, and post treatment edema and enhancement of the adjacent left psoas muscle. 3. No vertebral or other spinal metastatic disease identified. 4. Multilevel advanced spinal degeneration in the setting of chronic scoliosis. - associated multilevel degenerative vertebral edema and enhancement in the cervical spine, cervicothoracic and thoracolumbar junctions, and the lumbar spine. - associated multilevel degenerative cervical and lumbar spinal stenosis. No spinal cord signal abnormality.  Oncologic History History includes microinvasive T1N0 right breast cancer, which was ER/PR positive and HER-2 2+ with insufficient material for FISH at diagnosis in 03/1999, treated with lumpectomy with 2 sentinel node evaluation, local radiation and 5 years of Tamoxifen thru 05/2004, then aromatase inhibitor 07/2004 thru 04/2006.    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 She had 6 cycles of taxol/carboplatin through Jan 2010, and vaginal cuff brachytherapy. She did well until some vague LUQ symptoms in Nov. 2012, with CT AP in [facility] system Mar 22, 2011 showing left pelvic sidewall involvement.  Recurrence #1 03/2011 --  Treated 05/2011 - 12/2011 She was taken to laparoscopic evaluation 04-25-2011, with findings of dense fibrosis in the area as well as mass encasing obturator nerve and vein as well as internal iliac vasculature; left ureterolysis was performed. No pathology was submitted from that procedure. We proceeded with initial 3 additional cycles of taxol/ carboplatin (with carbo skin tests) from 05-16-2011 thru 06-28-2011, then repeated CT AP on 07-17-2011. The  CT showed improvement in the left pelvic sidewall mass now 1.9 x 2 cm and 3.4 cm cephalocaudad, as compared with 2.5 x 2.2 cm and   5.4 cm on CT 03-22-11, and no progressive disease elsewhere. She saw me after the CT, with recommendation for an additonal 3 cycles of same chemotherapy then repeat scan. Cycle 4 was taxol + carbo, with carbo skin test and neulasta, then cycle 5 as above only taxol.  She then completed radiation therapy under the care of Dr. Kinard which she completed in August of 2013.  She had a CT scan on March 23, 2012 that revealed:  IMPRESSION:  1. Today's study demonstrates a positive response to therapy with decreased size of left pelvic sidewall nodal masses, as detailed above. No new soft tissue masses or new lymphadenopathy is identified within the abdomen or pelvis. Continue attention on follow-up studies is recommended.  2. Cholelithiasis without findings to suggest acute cholecystitis.  3. Colonic diverticulosis without findings to suggest acute diverticulitis at this time.  4. Mild asymmetric urinary bladder wall thickening is unchanged compared to prior examinations and is nonspecific. No definite bladder wall mass is identified at this time.  5. Additional findings, similar to prior examinations, as above.   There had been another lymph node identified on her CT scan in may that measured 2.1 x 1.7 cm. In reviewing her CT report it states that the nodal mass higher up along the pelvic sidewalls is no longer clearly identified although there continues to be some amorphus soft tissue in the region with the previously noted nodal mass resided. This was confirmed with the reading radiologist and communicated to the patient.   CT 06/22/12:  Normal urinary bladder. Hysterectomy. Decreased size of the left pelvic peripherally enhancing lesion. This measures 1.8 x 1.7 cm  on image 55/series 2 versus 2.4 x 2.1 cm on the prior exam. No new lesions are identified. No significant free  fluid.  Bones/Musculoskeletal: No acute osseous abnormality. Trace L4-L5 anterolisthesis is likely degenerative. There is S-shaped lumbar  spine curvature.  IMPRESSION:  1. Further decreased size of the left pelvic peripherally enhancing lesion.  2. No new sites of new or progressive disease.  3. Esophageal air fluid level suggests dysmotility or gastroesophageal reflux.  4. Cholelithiasis.   CT 11/02/2012:  The index lesion within the left pelvic side wall measures 2.2 x 2.7 cm, image 55/series 2. Previously this measured 1.8 x 1.7 cm. No new lesions identified. The stomach and small bowel loops appear within normal limits. Normal appearance of the proximal colon. Multiple sigmoid diverticula identified without acute inflammation. Review of the visualized bony structures shows scoliosis and multilevel degenerative disc disease within the lumbar spine.  IMPRESSION:  1. The left pelvic side wall lesion demonstrates mild increase in size from previous exam.  2. No new sites of new or progressive disease.  3. Cholelithiasis.   Recurrence #2 (versus progression off chemo) - treated with surgery/intraop 03/2013 CT 01/28/13: FINDINGS:  Lung bases are clear. No focal hepatic lesion. The gallbladder, pancreas, spleen, adrenal glands, and kidneys are normal. There are several gallstones within the lumen of the gallbladder. The stomach, small bowel, and cecum are normal. The diverticulum sigmoid colon. Rectum is normal. Abdominal aorta is normal in caliber. No retroperitoneal or periportal lymphadenopathy. No evidence of peritoneal disease. Within the pelvis, the left pelvic sidewall lobular enhancing mass which is slightly increased in size. The lesion is similar in axial dimension measuring 30 x 21 mm compared to the 29 x 22 mm on prior remeasured. In the craniocaudad dimension, lesion measures 26 mm 1ncreased from 21 mm on prior. No additional pelvic lymphadenopathy. Hysterectomy   anatomy. Vaginal cuff  appears normal. Bladder is normal.  Review of bone windows demonstrates no aggressive osseous lesions.  IMPRESSION:  1. Interval small increase in volume of enhancing mass adjacent to the left pelvic sidewall.  2. No evidence of abdominal or pelvic lymphadenopathy.  PET 03/10/13: 1. Hypermetabolic mass in the deep left pelvis consistent with metastasis.  2. No additional evidence of local metastasis. No evidence of distant metastasis.  3. Small right lower lobe pulmonary nodule is not hypermetabolic. Recommend attention on follow-up.  She underwent resection of pelvic mass, left sided ureterolysis, intra-operative radiation therapy by radiation oncology, omental J flap, and left ureteral stent placement by urology at [facility]  on 04/02/13.  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  Final pathology resulted:  Omentum, biopsy: Mature adipose tissue, fibrous tissue, calcifications and bland appearing gland consistent with endosalpingiosis.  No diagnostic carcinoma identified, pancytokeratin confirmatory (negative).  Lymph nodes, left obturator, regional resection: Aggregate of lymph node (4.1 cm) involved by poorly differentiated high grade carcinoma (see Light Microscopy).  Abundant tumor necrosis. She had her cystoscopy and ureteral stent removal on 05/20/2013.  She had a CT scan March 17 , 2015 that revealed no evidence of recurrent disease.   She had a CT scan performed on 02/05/14: IMPRESSION:  No CT findings for recurrent or metastatic disease involving the abdomen/ pelvis.  Recurrence #3 - 06/2014 treated with XRT 06/2014-08/2014 MRI lumbar spine at [facility] on 06-13-2014 showed known scoliosis and degenerative disc disease, with new finding of 2.7 x 2.4 x 1.9 cm soft tissue mass at L3-4 involving left  neural foramen, with focal cortical destruction of adjacent L3 vertebral body and portion of left L4 pedicle. She had CT biopsy on 06-21-14, with pathology ([id number]) poorly differentiated carcinoma consistent with high grade serous. Bone scan 06-22-14 had uptake posteriorly in upper lumbar spine and a subtle increased area of uptake right greater trochanter of unclear significance. IMRT by Dr Sondra Come 2-29 thru 08-12-14. First zometa 07-07-14-6/16.  Post-XRT CT scan revealed: IMPRESSION: 1. Since the biopsy study of 06/21/2014, similar to slight decrease in size of a left paraspinous lesion at L3-4. 2. No new sites of metastatic disease identified. 3. 8 mm ground-glass nodule in the right lower lobe is grossly similar to 03/10/2013, suggesting a benign etiology. 4. Cholelithiasis. 5. Apparent sigmoid colonic wall thickening which could be due to underdistention. Colitis felt less likely. 6. Atherosclerosis, including within the coronary arteries. 7. Pelvic floor laxity.  CT 10/07/14: Musculoskeletal: Stable to slight interval decrease in size of left paraspinous lesion at the L3-4 level with peripheral enhancement measuring 1.6 x 1.7 cm (image 72; series 2), previously 2.0 x 1.8 cm. IMPRESSION: Stable to slight interval decrease in size of left paraspinous lesion at L3-4. No new sites of metastatic disease.  01/19/15: The left paraspinous lesion is again noted. Today this measures 11 mm, image 70/ series 2. Previously 1.7 cm. There are no new bone lesions identified. IMPRESSION: 1. Decrease in size of left paraspinous metastasis. 2. No new sites of disease. 3. Stable ground-glass attenuating nodule within the superior segment of right lower lobe. 4. Gallstones.  PET 05/11/15: IMPRESSION: 1. The small soft tissue mass just lateral to the left L3 for neural foramen is hypermetabolic, favoring malignancy. 2. There is also a small focus of  hypermetabolic activity between the left L1 and L2  transverse processes, but without a CT correlate. 3. The 13 mm sub solid nodule in the superior segment right lower lobe is stable from recent exams but has slowly increased in size over the last 7 years. Although not hypermetabolic, the appearance is concerning for the possibility of a low grade adenocarcinoma.  She saw Dr. Roxan Hockey and cardiothoracic surgery in January. They recommended discuss the possibility of stereotactic radiation with Dr. Sondra Come versus follow-up. I have discussed the PET scan results with her. At that time as there had not been significant growth and there is no room to proceed with additional radiation she opted for short interval follow-up as her quality of life is good and she was completely asymptomatic.   Recurrence #4 06/2015 treated with dose-dense Carbo/Tax 07/2015 - __?mid-July 2017?_____ ending after cycle 6 day 8 due to intolerance CT scan of the chest, abdomen and pelvis on 07/11/2015.IMPRESSION: 1. 13 mm mixed solid and sub solid nodule in the posterior right lower lobe was present in 2009 and has clearly progressed in the interval since that study. Imaging features remain highly concerning for low-grade or well differentiated adenocarcinoma. 2. Slight increase size of in the hypermetabolic, rim enhancing nodule identified adjacent to the left L3-4 neural foramen. Metastatic disease remains a concern. 3. No evidence for discrete soft tissue lesion between the left L1 and 2 transverse processes, the site of focal FDG uptake on the recent PET-CT. 4. Cholelithiasis. 5. Abdominal aortic atherosclerosis.  She subsequently began dose dense carboplatin and paclitaxel on March 2017. She's undergone 3 cycles. She had to have growth factor support. She subsequent underwent a port placement on May 3 secondary to poor IV access. After 3 cycles of chemotherapy she underwent a PET scan which revealed:  IMPRESSION: 1. Hypermetabolic left W2-5 paraspinous metastasis  (biopsy-proven) is stable in size and slightly decreased in metabolism. 2. Previously described small focus of hypermetabolism between the left L2 and L3 spinous processes has resolved. 3. New mild linear hypermetabolism to the left of the T11-12 spinous processes without discrete mass on the CT images, favor benign activity related activity, recommend attention on follow-up PET-CT. 4. No definite new sites of hypermetabolic metastatic disease. No recurrent hypermetabolic metastatic disease in the pelvis. 5. Interval stability of subsolid 1.3 cm superior segment right lower lobe pulmonary nodule without associated significant metabolism, which has grown compared to the 2009 chest CT study, and remain suspicious for low grade adenocarcinoma. 6. Additional findings include aortic atherosclerosis, coronary atherosclerosis, mild multinodular goiter with no hypermetabolic thyroid nodules, cholelithiasis and moderate sigmoid diverticulosis.  She did have PD-L1 testing and was negative. She then underwent 3 additional cycles of chemotherapy. She did not tolerate chemotherapy and it was stopped after cycle #6 day 8 secondary to fatigue and other toxicities. She had a PET scan on October 2 that revealed:  Recurrence #5 3 of paraspinal lesion (3 months after platinum)  Ultimately treated with radiation 10/2016 IMPRESSION 02/12/16: 1. Interval increase in size and metabolic activity of the LEFT paraspinal soft tissue metastasis. 2. New activity within the musculature of the LEFT chest wall. Given the unusual location of the paraspinal metastasis cannot exclude a second soft tissue metastasis to the musculature however favor benign physiologic activity. 3. Stable RIGHT lower lobe pulmonary nodule.  She had a CT scan in January 2018 when she saw Dr. Marko Plume that revealed no new evidence of disease. It was compared to her CT scan November 27 of  2017. Within the left paraspinous area centered at L3-4 there was a  mass that measured 2.1 x 2.1 cm. This was compared to 1.9 x 1.9. The 2.2 cm cranial caudal sagittal size was about the same.   After discussion with the family the patient opted for close follow-up and comes in today after having had a CT scan earlier this week that revealed: Musculoskeletal: LEFT paraspinal mass (L3-L4) measures 27 x 21 mm (image 29, series 2) compared to 25 x 18 mm on prior remeasured. No new soft tissue lesions identified.  IMPRESSION: 1. Continue mild interval increase in size of LEFT paraspinal mass (1-2 mm). 2. No evidence of new metastatic disease in the abdomen pelvis. 3. Post hysterectomy anatomy.  RADIATION TX 10/22/2016 to 10/28/2016:  The L-spine was treated to 27 Gy in 3 fractions at 9 Gy per fraction.    CT 05/09/17: Musculoskeletal: Previously noted paraspinal mass adjacent to the left side of L3-L4 is far less distinct than prior examinations, now an amorphous area of slightly heterogeneous enhancement. Increasing amounts of low-attenuation the adjacent paraspinal musculature, likely to reflect evolving fatty degeneration from prior radiation therapy. There are no aggressive appearing lytic or blastic lesions noted in the visualized portions of the skeleton.  IMPRESSION: 1. Paraspinal mass adjacent to the left side of L3-L4 is less distinct than prior examinations, and is therefore difficult to discretely measure, however, the overall appearance suggests continued positive response to therapy. 2. No new signs of metastatic disease elsewhere in the abdomen or pelvis. 3. Aortic atherosclerosis, in addition to at least right coronary artery disease. Assessment for potential risk factor modification, dietary therapy or pharmacologic therapy may be warranted, if clinically indicated. 4. Colonic diverticulosis without evidence of acute diverticulitis at this time. 5. Additional incidental findings, as above.   Current Meds:  Outpatient Encounter Medications as  of 12/08/2017  Medication Sig  . amLODipine (NORVASC) 5 MG tablet Take 5 mg by mouth daily.  Marland Kitchen atenolol (TENORMIN) 25 MG tablet Take 25 mg by mouth 2 (two) times daily.   . Biotin 5000 MCG TABS Take 5,000 mcg by mouth daily.  . Cyanocobalamin (VITAMIN B-12) 5000 MCG SUBL Place 1 tablet under the tongue daily.   Marland Kitchen ibuprofen (ADVIL,MOTRIN) 200 MG tablet Take 200-400 mg by mouth 2 (two) times daily as needed. Reported on 11/13/2015  . lidocaine-prilocaine (EMLA) cream Apply to [facility] cath 1-2 hours prior to access as directed  . LORazepam (ATIVAN) 0.5 MG tablet 1 tablet po 30 minutes prior to radiation or MRI  . Magnesium 250 MG TABS Take 250 mg by mouth every Monday, Wednesday, and Friday.   . Multiple Vitamin (MULTIVITAMIN WITH MINERALS) TABS tablet Take 1 tablet by mouth daily.  . naproxen (NAPROSYN) 500 MG tablet Take 1 tablet (500 mg total) by mouth daily as needed.  . potassium gluconate 595 MG TABS tablet Take 595 mg by mouth every Monday, Wednesday, and Friday.  . simvastatin (ZOCOR) 20 MG tablet Take 20 mg by mouth daily.  . [DISCONTINUED] cholecalciferol (VITAMIN D) 1000 UNITS tablet Take 1,000 Units by mouth every Monday, Wednesday, and Friday.   No facility-administered encounter medications on file as of 12/08/2017.     Allergy:  Allergies  Allergen Reactions  . Ace Inhibitors Anaphylaxis  . Codeine Nausea Only  . Demerol Nausea Only  . Dilaudid [Hydromorphone Hcl] Other (See Comments)    "total loss of her mind"    Social Hx:   Social History   Socioeconomic History  .  Marital status: Married    Spouse name: Jack  . Number of children: 2  . Years of education: Not on file  . Highest education level: Not on file  Occupational History  . Occupation: retired  Social Needs  . Financial resource strain: Not on file  . Food insecurity:    Worry: Not on file    Inability: Not on file  . Transportation needs:    Medical: Not on file    Non-medical: Not on file  Tobacco  Use  . Smoking status: Former Smoker    Types: Cigarettes    Last attempt to quit: 05/13/1962    Years since quitting: 55.6  . Smokeless tobacco: Never Used  Substance and Sexual Activity  . Alcohol use: Yes    Comment: ocass  . Drug use: No  . Sexual activity: Never  Lifestyle  . Physical activity:    Days per week: Not on file    Minutes per session: Not on file  . Stress: Not on file  Relationships  . Social connections:    Talks on phone: Not on file    Gets together: Not on file    Attends religious service: Not on file    Active member of club or organization: Not on file    Attends meetings of clubs or organizations: Not on file    Relationship status: Not on file  . Intimate partner violence:    Fear of current or ex partner: Not on file    Emotionally abused: Not on file    Physically abused: Not on file    Forced sexual activity: Not on file  Other Topics Concern  . Not on file  Social History Narrative  . Not on file    Past Surgical Hx:  Past Surgical History:  Procedure Laterality Date  . APPENDECTOMY    . BREAST LUMPECTOMY Right 2000  . FOOT SURGERY     RIGHT  . TONSILLECTOMY    . TOTAL ABDOMINAL HYSTERECTOMY W/ BILATERAL SALPINGOOPHORECTOMY      Past Medical Hx:  Past Medical History:  Diagnosis Date  . Endometrial cancer (HCC) 12/2007   s/p total abdominal hysterectomy at [place] - ovaries were also removed   . Family history of breast cancer   . History of breast cancer    right breast -- treated with lumpectomy and radiation therarpy, postoperatively with tamoxifen   . History of radiation therapy 10/22/2016 to 10/28/2016  . Hypercholesterolemia   . Hyperlipidemia   . Hypertension   . Palpitations   . Personal history of radiation therapy 2006  . PVC's (premature ventricular contractions)   . Radiation 07/11/14-08/12/14   left lumbar paraspinal area 55 gray  . S/P radiation therapy 10/28/11-12/05/11   5040 cGy left pelvis  . S/P radiation  therapy    Intracavitary brachytherapy of uterus    Family Hx:  Family History  Problem Relation Age of Onset  . Thrombosis Father        coronary thrombosis  . Hypertension Father   . Heart failure Mother   . Coronary artery disease Brother   . Breast cancer Maternal Aunt        dx in her 40s  . Stroke Maternal Uncle   . Stroke Maternal Grandfather    Review of Systems  Neurological: Positive for numbness.  All other systems reviewed and are negative.   Vitals:  Blood pressure (!) 162/73, pulse 65, temperature (!) 97.3 F (36.3 C), temperature source   Oral, resp. rate 20, height 4' 11" (1.499 m), weight 109 lb 4.8 oz (49.6 kg), SpO2 100 %.  Physical Exam:  ECOG PERFORMANCE STATUS: 1 - Symptomatic but completely ambulatory   General :  Well developed, 76 y.o., female in no apparent distress HEENT:  Normocephalic/atraumatic, symmetric, EOMI, eyelids normal Neck:   Supple, no masses.  Lymphatics:  No cervical/ submandibular/ supraclavicular/ infraclavicular/ inguinal adenopathy Respiratory:  Respirations unlabored, no use of accessory muscles CV:   Deferred Breast:  Deferred Musculoskeletal: No CVA tenderness, normal muscle strength. Abdomen:   Soft, non-tender and nondistended. No evidence of hernia. No masses. Extremities:  No lymphedema, no erythema, non-tender. Skin:   Normal inspection Neuro/Psych:  No focal motor deficit, no abnormal mental status. Normal gait. Normal affect. Alert and oriented to person, place, and time  Genito Urinary: Vulva: Normal external female genitalia.  Bladder/urethra: Urethral meatus normal in size and location. No lesions or   masses, well supported bladder Speculum exam: Vagina: telangectasia, no lesion, no discharge, no bleeding. Bimanual exam: Cervix/Uterus/Adnexa: Surgically absent  Adnexa: No masses. Rectovaginal:  Good tone, no masses, no cul de sac nodularity, no parametrial involvement or nodularity.    Assessment/Plan:    H/o recurrent uterine papillary serous carcinoma initially stage IB diagnosed in 2009.  She is s/p resection of pelvic mass, left sided ureterolysis, intra-operative radiation therapy by radiation oncology, omental J flap, and left ureteral stent placement by urology at [facility]  on 04/02/13.  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   She then unfortunately suffered a recurrence in the left paraspinal space and was treated with Zometa as well as IMRT. Imaging shows that the lesion was initially smaller but slowly grew in size. She subsequently had undergone 3 cycles of dose dense carboplatin with a taxane. Her disease appeared stable with some potential decrease in the metabolic activity. She underwent an additional 3 cycles and had an increase in size of the mass and increasing FDG avidity. In June 2018 she underwent cyberknife to the region.  1. Surveillance plan  Return every 6 months for pelvic with imaging prior  She sees medical oncology every year. 2. Numbness left leg  Dr. Gehrig has discussed this with her previously and explained likely treatment related  3. I would like to followup on the PDL1 testing, ER/PR testing, and genetic testing  Face to face time with patient was 15 minutes. Over 50% of this time was spent on counseling and coordination of care.   B ,  12/22/2017, 3:58 PM  ADDENDUM we obtained records showing PDL1 negative, [facility] 46 gene panel negative, and ER/PR negative  Cc:  Russo, John, MD (PCP)  

## 2017-12-31 ENCOUNTER — Inpatient Hospital Stay: Payer: Medicare Other | Attending: Hematology and Oncology

## 2017-12-31 DIAGNOSIS — C541 Malignant neoplasm of endometrium: Secondary | ICD-10-CM | POA: Diagnosis not present

## 2017-12-31 DIAGNOSIS — Z95828 Presence of other vascular implants and grafts: Secondary | ICD-10-CM

## 2017-12-31 DIAGNOSIS — C7951 Secondary malignant neoplasm of bone: Secondary | ICD-10-CM | POA: Insufficient documentation

## 2017-12-31 DIAGNOSIS — Z853 Personal history of malignant neoplasm of breast: Secondary | ICD-10-CM

## 2017-12-31 DIAGNOSIS — D701 Agranulocytosis secondary to cancer chemotherapy: Secondary | ICD-10-CM

## 2017-12-31 DIAGNOSIS — Z452 Encounter for adjustment and management of vascular access device: Secondary | ICD-10-CM | POA: Insufficient documentation

## 2017-12-31 DIAGNOSIS — T451X5A Adverse effect of antineoplastic and immunosuppressive drugs, initial encounter: Secondary | ICD-10-CM

## 2017-12-31 MED ORDER — SODIUM CHLORIDE 0.9% FLUSH
10.0000 mL | Freq: Once | INTRAVENOUS | Status: AC
  Administered 2017-12-31: 10 mL
  Filled 2017-12-31: qty 10

## 2017-12-31 MED ORDER — HEPARIN SOD (PORK) LOCK FLUSH 100 UNIT/ML IV SOLN
250.0000 [IU] | Freq: Once | INTRAVENOUS | Status: DC
  Filled 2017-12-31: qty 5

## 2017-12-31 MED ORDER — HEPARIN SOD (PORK) LOCK FLUSH 100 UNIT/ML IV SOLN
500.0000 [IU] | Freq: Once | INTRAVENOUS | Status: AC
  Administered 2017-12-31: 500 [IU]
  Filled 2017-12-31: qty 5

## 2018-01-15 ENCOUNTER — Encounter: Payer: Self-pay | Admitting: Oncology

## 2018-01-15 ENCOUNTER — Telehealth: Payer: Self-pay | Admitting: Oncology

## 2018-01-15 DIAGNOSIS — C541 Malignant neoplasm of endometrium: Secondary | ICD-10-CM

## 2018-01-15 NOTE — Telephone Encounter (Signed)
Left a message for patient regarding CT scan scheduled for December.  Requested a return call.

## 2018-01-22 NOTE — Telephone Encounter (Signed)
[name] called back and was notified of CT scheduled for 04/29/18 at 9:30 (9:15 arrival, npo 4 hours before scan, pick up contrast and drink 1st bottle at 7:30, 2nd at 8:30 day of scan).  She verbalized understanding and agreement.

## 2018-02-05 DIAGNOSIS — M859 Disorder of bone density and structure, unspecified: Secondary | ICD-10-CM | POA: Diagnosis not present

## 2018-02-11 ENCOUNTER — Inpatient Hospital Stay: Payer: Medicare Other | Attending: Hematology and Oncology

## 2018-02-14 DIAGNOSIS — Z23 Encounter for immunization: Secondary | ICD-10-CM | POA: Diagnosis not present

## 2018-02-26 DIAGNOSIS — R300 Dysuria: Secondary | ICD-10-CM | POA: Diagnosis not present

## 2018-03-09 DIAGNOSIS — S93401A Sprain of unspecified ligament of right ankle, initial encounter: Secondary | ICD-10-CM | POA: Diagnosis not present

## 2018-03-25 ENCOUNTER — Inpatient Hospital Stay: Payer: Medicare Other | Attending: Hematology and Oncology

## 2018-03-25 VITALS — BP 151/69 | HR 63 | Temp 98.6°F | Resp 16

## 2018-03-25 DIAGNOSIS — C7951 Secondary malignant neoplasm of bone: Secondary | ICD-10-CM | POA: Diagnosis not present

## 2018-03-25 DIAGNOSIS — D701 Agranulocytosis secondary to cancer chemotherapy: Secondary | ICD-10-CM

## 2018-03-25 DIAGNOSIS — Z452 Encounter for adjustment and management of vascular access device: Secondary | ICD-10-CM | POA: Diagnosis not present

## 2018-03-25 DIAGNOSIS — C541 Malignant neoplasm of endometrium: Secondary | ICD-10-CM | POA: Diagnosis not present

## 2018-03-25 DIAGNOSIS — Z95828 Presence of other vascular implants and grafts: Secondary | ICD-10-CM

## 2018-03-25 DIAGNOSIS — T451X5A Adverse effect of antineoplastic and immunosuppressive drugs, initial encounter: Secondary | ICD-10-CM

## 2018-03-25 DIAGNOSIS — Z853 Personal history of malignant neoplasm of breast: Secondary | ICD-10-CM

## 2018-03-25 MED ORDER — HEPARIN SOD (PORK) LOCK FLUSH 100 UNIT/ML IV SOLN
500.0000 [IU] | Freq: Once | INTRAVENOUS | Status: AC
  Administered 2018-03-25: 500 [IU]
  Filled 2018-03-25: qty 5

## 2018-03-25 MED ORDER — SODIUM CHLORIDE 0.9% FLUSH
10.0000 mL | Freq: Once | INTRAVENOUS | Status: AC
  Administered 2018-03-25: 10 mL
  Filled 2018-03-25: qty 10

## 2018-03-25 NOTE — Patient Instructions (Signed)
Implanted Port Home Guide An implanted port is a type of central line that is placed under the skin. Central lines are used to provide IV access when treatment or nutrition needs to be given through a person's veins. Implanted ports are used for long-term IV access. An implanted port may be placed because:  You need IV medicine that would be irritating to the small veins in your hands or arms.  You need long-term IV medicines, such as antibiotics.  You need IV nutrition for a long period.  You need frequent blood draws for lab tests.  You need dialysis.  Implanted ports are usually placed in the chest area, but they can also be placed in the upper arm, the abdomen, or the leg. An implanted port has two main parts:  Reservoir. The reservoir is round and will appear as a small, raised area under your skin. The reservoir is the part where a needle is inserted to give medicines or draw blood.  Catheter. The catheter is a thin, flexible tube that extends from the reservoir. The catheter is placed into a large vein. Medicine that is inserted into the reservoir goes into the catheter and then into the vein.  How will I care for my incision site? Do not get the incision site wet. Bathe or shower as directed by your health care provider. How is my port accessed? Special steps must be taken to access the port:  Before the port is accessed, a numbing cream can be placed on the skin. This helps numb the skin over the port site.  Your health care provider uses a sterile technique to access the port. ? Your health care provider must put on a mask and sterile gloves. ? The skin over your port is cleaned carefully with an antiseptic and allowed to dry. ? The port is gently pinched between sterile gloves, and a needle is inserted into the port.  Only "non-coring" port needles should be used to access the port. Once the port is accessed, a blood return should be checked. This helps ensure that the port  is in the vein and is not clogged.  If your port needs to remain accessed for a constant infusion, a clear (transparent) bandage will be placed over the needle site. The bandage and needle will need to be changed every week, or as directed by your health care provider.  Keep the bandage covering the needle clean and dry. Do not get it wet. Follow your health care provider's instructions on how to take a shower or bath while the port is accessed.  If your port does not need to stay accessed, no bandage is needed over the port.  What is flushing? Flushing helps keep the port from getting clogged. Follow your health care provider's instructions on how and when to flush the port. Ports are usually flushed with saline solution or a medicine called heparin. The need for flushing will depend on how the port is used.  If the port is used for intermittent medicines or blood draws, the port will need to be flushed: ? After medicines have been given. ? After blood has been drawn. ? As part of routine maintenance.  If a constant infusion is running, the port may not need to be flushed.  How long will my port stay implanted? The port can stay in for as long as your health care provider thinks it is needed. When it is time for the port to come out, surgery will be   done to remove it. The procedure is similar to the one performed when the port was put in. When should I seek immediate medical care? When you have an implanted port, you should seek immediate medical care if:  You notice a bad smell coming from the incision site.  You have swelling, redness, or drainage at the incision site.  You have more swelling or pain at the port site or the surrounding area.  You have a fever that is not controlled with medicine.  This information is not intended to replace advice given to you by your health care provider. Make sure you discuss any questions you have with your health care provider. Document  Released: 04/29/2005 Document Revised: 10/05/2015 Document Reviewed: 01/04/2013 Elsevier Interactive Patient Education  2017 [employer]  

## 2018-04-03 ENCOUNTER — Other Ambulatory Visit: Payer: Self-pay | Admitting: Radiation Oncology

## 2018-04-03 ENCOUNTER — Other Ambulatory Visit: Payer: Self-pay | Admitting: *Deleted

## 2018-04-03 DIAGNOSIS — C7951 Secondary malignant neoplasm of bone: Secondary | ICD-10-CM

## 2018-04-15 ENCOUNTER — Other Ambulatory Visit: Payer: Medicare Other

## 2018-04-21 ENCOUNTER — Ambulatory Visit: Payer: Self-pay | Admitting: Radiation Oncology

## 2018-04-23 ENCOUNTER — Other Ambulatory Visit: Payer: Self-pay | Admitting: Gynecologic Oncology

## 2018-04-23 DIAGNOSIS — C549 Malignant neoplasm of corpus uteri, unspecified: Secondary | ICD-10-CM

## 2018-04-23 NOTE — Progress Notes (Signed)
Bmet for CT

## 2018-04-24 ENCOUNTER — Telehealth: Payer: Self-pay | Admitting: *Deleted

## 2018-04-24 NOTE — Telephone Encounter (Signed)
Called and left the patient a message to call the office back. Need to schedule the patient for a lab appt before her CT scan.

## 2018-04-24 NOTE — Telephone Encounter (Signed)
Patient returned call and was scheduled for a lab appt on Tuesday 12/17 in the afternoon

## 2018-04-28 ENCOUNTER — Inpatient Hospital Stay: Payer: Medicare Other | Attending: Hematology and Oncology

## 2018-04-28 DIAGNOSIS — C541 Malignant neoplasm of endometrium: Secondary | ICD-10-CM | POA: Insufficient documentation

## 2018-04-28 DIAGNOSIS — C7951 Secondary malignant neoplasm of bone: Secondary | ICD-10-CM | POA: Insufficient documentation

## 2018-04-28 DIAGNOSIS — Z452 Encounter for adjustment and management of vascular access device: Secondary | ICD-10-CM | POA: Diagnosis not present

## 2018-04-28 DIAGNOSIS — C549 Malignant neoplasm of corpus uteri, unspecified: Secondary | ICD-10-CM

## 2018-04-28 LAB — BASIC METABOLIC PANEL
Anion gap: 10 (ref 5–15)
BUN: 19 mg/dL (ref 8–23)
CO2: 28 mmol/L (ref 22–32)
Calcium: 9.7 mg/dL (ref 8.9–10.3)
Chloride: 104 mmol/L (ref 98–111)
Creatinine, Ser: 0.87 mg/dL (ref 0.44–1.00)
GFR calc Af Amer: >60 mL/min (ref >60)
GFR calc non Af Amer: >60 mL/min (ref >60)
Glucose, Bld: 119 mg/dL — ABNORMAL HIGH (ref 70–99)
Potassium: 3.6 mmol/L (ref 3.5–5.1)
Sodium: 142 mmol/L (ref 135–145)

## 2018-04-29 ENCOUNTER — Ambulatory Visit (HOSPITAL_COMMUNITY)
Admission: RE | Admit: 2018-04-29 | Discharge: 2018-04-29 | Disposition: A | Discharge status: Home / Self Care | Payer: Medicare Other | Source: Ambulatory Visit | Attending: Gynecologic Oncology | Admitting: Gynecologic Oncology

## 2018-04-29 DIAGNOSIS — C541 Malignant neoplasm of endometrium: Secondary | ICD-10-CM

## 2018-04-29 MED ORDER — SODIUM CHLORIDE (PF) 0.9 % IJ SOLN
INTRAMUSCULAR | Status: AC
  Filled 2018-04-29: qty 50

## 2018-04-29 MED ORDER — IOHEXOL 300 MG/ML  SOLN
100.0000 mL | Freq: Once | INTRAMUSCULAR | Status: AC | PRN
  Administered 2018-04-29: 100 mL via INTRAVENOUS

## 2018-04-30 ENCOUNTER — Ambulatory Visit
Admission: RE | Admit: 2018-04-30 | Discharge: 2018-04-30 | Disposition: A | Discharge status: Home / Self Care | Payer: Medicare Other | Source: Ambulatory Visit | Attending: Radiation Oncology | Admitting: Radiation Oncology

## 2018-04-30 DIAGNOSIS — C7951 Secondary malignant neoplasm of bone: Secondary | ICD-10-CM | POA: Diagnosis not present

## 2018-04-30 DIAGNOSIS — C541 Malignant neoplasm of endometrium: Secondary | ICD-10-CM | POA: Diagnosis not present

## 2018-04-30 MED ORDER — GADOBENATE DIMEGLUMINE 529 MG/ML IV SOLN
10.0000 mL | Freq: Once | INTRAVENOUS | Status: AC | PRN
  Administered 2018-04-30: 10 mL via INTRAVENOUS

## 2018-05-04 ENCOUNTER — Telehealth: Payer: Self-pay | Admitting: Radiation Oncology

## 2018-05-04 NOTE — Telephone Encounter (Signed)
LM on the patient's cell phone and called home and LM to review results from imaging.

## 2018-05-05 ENCOUNTER — Ambulatory Visit: Payer: Medicare Other | Admitting: Radiation Oncology

## 2018-05-05 ENCOUNTER — Inpatient Hospital Stay: Payer: Medicare Other

## 2018-05-05 DIAGNOSIS — T451X5A Adverse effect of antineoplastic and immunosuppressive drugs, initial encounter: Secondary | ICD-10-CM

## 2018-05-05 DIAGNOSIS — D701 Agranulocytosis secondary to cancer chemotherapy: Secondary | ICD-10-CM

## 2018-05-05 DIAGNOSIS — C7951 Secondary malignant neoplasm of bone: Secondary | ICD-10-CM | POA: Diagnosis not present

## 2018-05-05 DIAGNOSIS — Z95828 Presence of other vascular implants and grafts: Secondary | ICD-10-CM

## 2018-05-05 DIAGNOSIS — Z853 Personal history of malignant neoplasm of breast: Secondary | ICD-10-CM

## 2018-05-05 DIAGNOSIS — Z452 Encounter for adjustment and management of vascular access device: Secondary | ICD-10-CM | POA: Diagnosis not present

## 2018-05-05 DIAGNOSIS — C541 Malignant neoplasm of endometrium: Secondary | ICD-10-CM | POA: Diagnosis not present

## 2018-05-05 MED ORDER — HEPARIN SOD (PORK) LOCK FLUSH 100 UNIT/ML IV SOLN
500.0000 [IU] | Freq: Once | INTRAVENOUS | Status: AC
  Administered 2018-05-05: 500 [IU]
  Filled 2018-05-05: qty 5

## 2018-05-05 MED ORDER — SODIUM CHLORIDE 0.9% FLUSH
10.0000 mL | Freq: Once | INTRAVENOUS | Status: AC
  Administered 2018-05-05: 10 mL
  Filled 2018-05-05: qty 10

## 2018-05-05 NOTE — Patient Instructions (Signed)

## 2018-05-07 ENCOUNTER — Ambulatory Visit: Payer: Medicare Other | Admitting: Radiation Oncology

## 2018-05-11 ENCOUNTER — Other Ambulatory Visit: Payer: Self-pay | Admitting: Radiation Therapy

## 2018-05-27 NOTE — Progress Notes (Signed)
Follow Up Note: Gyn-Onc  [name] 78 y.o. female  CC:  Chief Complaint  Patient presents with  . Endometrial cancer [facility])    HPI:  [name] is seen for continuing attention to her recurrent endometrial carcinoma,   Interval History No new complaints today .  Since her last visit she has had additional imaging (in Dec 2019) noted below. In summary IMPRESSION: Stable small left L3 paraspinal soft tissue mass. No evidence of new or progressive metastatic disease, or other acute findings. Cholelithiasis.  No radiographic evidence of cholecystitis. Colonic diverticulosis, without radiographic evidence of diverticulitis.  In addition neurology ordered spine MRI:  IMPRESSION: Stable post treatment change of the partially necrotic LEFT paravertebral mass at L3-L4. 18 x 21 mm cross-section. Mass effect on the LEFT L3 nerve root redemonstrated. Enhancing and atrophic LEFT psoas is inseparable.  Oncologic Course History includes microinvasive T1N0 right breast cancer, which was ER/PR positive and HER-2 2+ with insufficient material for FISH at diagnosis in 03/1999, treated with lumpectomy with 2 sentinel node evaluation, local radiation and 5 years of Tamoxifen thru 05/2004, then aromatase inhibitor 07/2004 thru 04/2006.    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 She had 6 cycles of taxol/carboplatin through Jan 2010, and vaginal cuff brachytherapy. She did well until some vague LUQ symptoms in Nov. 2012, with CT AP in [facility] Mar 22, 2011 showing left pelvic sidewall involvement.  Recurrence #1 03/2011 --  Treated 05/2011 - 12/2011 She was taken to laparoscopic evaluation 04-25-2011, with findings of dense fibrosis in the area as well as mass encasing obturator nerve and vein as well as internal iliac vasculature; left ureterolysis was performed. No  pathology was submitted from that procedure. We proceeded with initial 3 additional cycles of taxol/ carboplatin (with carbo skin tests) from 05-16-2011 thru 06-28-2011, then repeated CT AP on 07-17-2011. The CT showed improvement in the left pelvic sidewall mass now 1.9 x 2 cm and 3.4 cm cephalocaudad, as compared with 2.5 x 2.2 cm and 5.4 cm on CT 03-22-11, and no progressive disease elsewhere. She saw me after the CT, with recommendation for an additonal 3 cycles of same chemotherapy then repeat scan. Cycle 4 was taxol + [place], with carbo skin test and neulasta, then cycle 5 as above only taxol.  She then completed radiation therapy under the care of Dr. Sondra Come which she completed in August of 2013.  She had a CT scan on March 23, 2012 that revealed:  IMPRESSION:  1. Today's study demonstrates a positive response to therapy with decreased size of left pelvic sidewall nodal masses, as detailed above. No new soft tissue masses or new lymphadenopathy is identified within the abdomen or pelvis. Continue attention on follow-up studies is recommended.  2. Cholelithiasis without findings to suggest acute cholecystitis.  3. Colonic diverticulosis without findings to suggest acute diverticulitis at this time.  4. Mild asymmetric urinary bladder wall thickening is unchanged compared to prior examinations and is nonspecific. No definite bladder wall mass is identified at this time.  5. Additional findings, similar to prior examinations, as above.   There had been another lymph node identified on her CT scan in may that measured 2.1 x 1.7 cm. In reviewing her CT report it states that the nodal mass higher up along the pelvic sidewalls is no longer clearly identified although there continues to be some  amorphus soft tissue in the region with the previously noted nodal mass resided. This was confirmed with the reading radiologist and communicated to the patient.   CT 06/22/12:  Normal urinary bladder. Hysterectomy.  Decreased size of the left pelvic peripherally enhancing lesion. This measures 1.8 x 1.7 cm  on image 55/series 2 versus 2.4 x 2.1 cm on the prior exam. No new lesions are identified. No significant free fluid.  Bones/Musculoskeletal: No acute osseous abnormality. Trace L4-L5 anterolisthesis is likely degenerative. There is S-shaped lumbar  spine curvature.  IMPRESSION:  1. Further decreased size of the left pelvic peripherally enhancing lesion.  2. No new sites of new or progressive disease.  3. Esophageal air fluid level suggests dysmotility or gastroesophageal reflux.  4. Cholelithiasis.   CT 11/02/2012:  The index lesion within the left pelvic side wall measures 2.2 x 2.7 cm, image 55/series 2. Previously this measured 1.8 x 1.7 cm. No new lesions identified. The stomach and small bowel loops appear within normal limits. Normal appearance of the proximal colon. Multiple sigmoid diverticula identified without acute inflammation. Review of the visualized bony structures shows scoliosis and multilevel degenerative disc disease within the lumbar spine.  IMPRESSION:  1. The left pelvic side wall lesion demonstrates mild increase in size from previous exam.  2. No new sites of new or progressive disease.  3. Cholelithiasis.   Recurrence #2 (versus progression off chemo) - treated with surgery/intraop 03/2013 CT 01/28/13: FINDINGS:  Lung bases are clear. No focal hepatic lesion. The gallbladder, pancreas, spleen, adrenal glands, and kidneys are normal. There are several gallstones within the lumen of the gallbladder. The stomach, small bowel, and cecum are normal. The diverticulum sigmoid colon. Rectum is normal. Abdominal aorta is normal in caliber. No retroperitoneal or periportal lymphadenopathy. No evidence of peritoneal disease. Within the pelvis, the left pelvic sidewall lobular enhancing mass which is slightly increased in size. The lesion is similar in axial dimension measuring 30 x 21 mm  compared to the 29 x 22 mm on prior remeasured. In the craniocaudad dimension, lesion measures 26 mm 1ncreased from 21 mm on prior. No additional pelvic lymphadenopathy. Hysterectomy anatomy. Vaginal cuff appears normal. Bladder is normal.  Review of bone windows demonstrates no aggressive osseous lesions.  IMPRESSION:  1. Interval small increase in volume of enhancing mass adjacent to the left pelvic sidewall.  2. No evidence of abdominal or pelvic lymphadenopathy.  PET 03/10/13: 1. Hypermetabolic mass in the deep left pelvis consistent with metastasis.  2. No additional evidence of local metastasis. No evidence of distant metastasis.  3. Small right lower lobe pulmonary nodule is not hypermetabolic. Recommend attention on follow-up.  She underwent resection of pelvic mass, left sided ureterolysis, intra-operative radiation therapy by radiation oncology, omental J flap, and left ureteral stent placement by urology at [facility]  on 04/02/13.  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  Final pathology resulted:  Omentum, biopsy: Mature adipose tissue, fibrous tissue, calcifications and bland appearing gland consistent with endosalpingiosis.  No diagnostic carcinoma identified, pancytokeratin confirmatory (negative).  Lymph nodes, left obturator, regional resection: Aggregate of lymph node (4.1 cm) involved by poorly differentiated high grade carcinoma (see Light Microscopy).  Abundant tumor necrosis. She had her cystoscopy and ureteral stent removal on 05/20/2013.  She had a CT scan March 17 , 2015  that revealed no evidence of recurrent disease.   She had a CT scan performed on 02/05/14: IMPRESSION:  No CT findings for recurrent or metastatic disease involving the abdomen/ pelvis.  Recurrence #3 - 06/2014 treated with XRT  06/2014-08/2014 MRI lumbar spine [facility] on 06-13-2014 showed known scoliosis and degenerative disc disease, with new finding of 2.7 x 2.4 x 1.9 cm soft tissue mass at L3-4 involving left neural foramen, with focal cortical destruction of adjacent L3 vertebral body and portion of left L4 pedicle. She had CT biopsy on 06-21-14, with pathology ([id number]) poorly differentiated carcinoma consistent with high grade serous. Bone scan 06-22-14 had uptake posteriorly in upper lumbar spine and a subtle increased area of uptake right greater trochanter of unclear significance. IMRT by Dr Sondra Come 2-29 thru 08-12-14. First zometa 07-07-14-6/16.  Post-XRT CT scan revealed: IMPRESSION: 1. Since the biopsy study of 06/21/2014, similar to slight decrease in size of a left paraspinous lesion at L3-4. 2. No new sites of metastatic disease identified. 3. 8 mm ground-glass nodule in the right lower lobe is grossly similar to 03/10/2013, suggesting a benign etiology. 4. Cholelithiasis. 5. Apparent sigmoid colonic wall thickening which could be due to underdistention. Colitis felt less likely. 6. Atherosclerosis, including within the coronary arteries. 7. Pelvic floor laxity.  CT 10/07/14: Musculoskeletal: Stable to slight interval decrease in size of left paraspinous lesion at the L3-4 level with peripheral enhancement measuring 1.6 x 1.7 cm (image 72; series 2), previously 2.0 x 1.8 cm. IMPRESSION: Stable to slight interval decrease in size of left paraspinous lesion at L3-4. No new sites of metastatic disease.  01/19/15: The left paraspinous lesion is again noted. Today this measures 11 mm, image 70/ series 2. Previously 1.7 cm. There are no new bone lesions identified. IMPRESSION: 1. Decrease in size of left paraspinous metastasis. 2. No new sites of disease. 3. Stable ground-glass attenuating nodule within the superior segment of right lower lobe. 4. Gallstones.  PET 05/11/15: IMPRESSION: 1. The  small soft tissue mass just lateral to the left L3 for neural foramen is hypermetabolic, favoring malignancy. 2. There is also a small focus of hypermetabolic activity between the left L1 and L2 transverse processes, but without a CT correlate. 3. The 13 mm sub solid nodule in the superior segment right lower lobe is stable from recent exams but has slowly increased in size over the last 7 years. Although not hypermetabolic, the appearance is concerning for the possibility of a low grade adenocarcinoma.  She saw Dr. Roxan Hockey and cardiothoracic surgery in January. They recommended discuss the possibility of stereotactic radiation with Dr. Sondra Come versus follow-up. I have discussed the PET scan results with her. At that time as there had not been significant growth and there is no room to proceed with additional radiation she opted for short interval follow-up as her quality of life is good and she was completely asymptomatic.   Recurrence #4 06/2015 treated with dose-dense Carbo/Tax 07/2015 - __?mid-July 2017?_____ ending after cycle 6 day 8 due to intolerance CT scan of the chest, abdomen and pelvis on 07/11/2015.IMPRESSION: 1. 13 mm mixed solid and sub solid nodule in the posterior right lower lobe was present in 2009 and has clearly progressed in the interval since that study. Imaging features remain highly concerning for low-grade or well differentiated adenocarcinoma. 2. Slight increase size of in the hypermetabolic, rim enhancing nodule identified adjacent to the left L3-4 neural foramen. Metastatic disease remains a concern. 3. No evidence for discrete soft  tissue lesion between the left L1 and 2 transverse processes, the site of focal FDG uptake on the recent PET-CT. 4. Cholelithiasis. 5. Abdominal aortic atherosclerosis.  She subsequently began dose dense carboplatin and paclitaxel on March 2017. She's undergone 3 cycles. She had to have growth factor support. She subsequent underwent a port  placement on May 3 secondary to poor IV access. After 3 cycles of chemotherapy she underwent a PET scan which revealed:  IMPRESSION: 1. Hypermetabolic left E0-7 paraspinous metastasis (biopsy-proven) is stable in size and slightly decreased in metabolism. 2. Previously described small focus of hypermetabolism between the left L2 and L3 spinous processes has resolved. 3. New mild linear hypermetabolism to the left of the T11-12 spinous processes without discrete mass on the CT images, favor benign activity related activity, recommend attention on follow-up PET-CT. 4. No definite new sites of hypermetabolic metastatic disease. No recurrent hypermetabolic metastatic disease in the pelvis. 5. Interval stability of subsolid 1.3 cm superior segment right lower lobe pulmonary nodule without associated significant metabolism, which has grown compared to the 2009 chest CT study, and remain suspicious for low grade adenocarcinoma. 6. Additional findings include aortic atherosclerosis, coronary atherosclerosis, mild multinodular goiter with no hypermetabolic thyroid nodules, cholelithiasis and moderate sigmoid diverticulosis.  She did have PD-L1 testing and was negative. She then underwent 3 additional cycles of chemotherapy. She did not tolerate chemotherapy and it was stopped after cycle #6 day 8 secondary to fatigue and other toxicities. She had a PET scan on October 2 that revealed:  Recurrence #5 3 of paraspinal lesion (3 months after platinum)  Ultimately treated with radiation 10/2016 IMPRESSION 02/12/16: 1. Interval increase in size and metabolic activity of the LEFT paraspinal soft tissue metastasis. 2. New activity within the musculature of the LEFT chest wall. Given the unusual location of the paraspinal metastasis cannot exclude a second soft tissue metastasis to the musculature however favor benign physiologic activity. 3. Stable RIGHT lower lobe pulmonary nodule.  She had a CT scan in January  2018 when she saw Dr. Marko Plume that revealed no new evidence of disease. It was compared to her CT scan November 27 of 2017. Within the left paraspinous area centered at L3-4 there was a mass that measured 2.1 x 2.1 cm. This was compared to 1.9 x 1.9. The 2.2 cm cranial caudal sagittal size was about the same.   After discussion with the family the patient opted for close follow-up and comes in today after having had a CT scan earlier this week that revealed: Musculoskeletal: LEFT paraspinal mass (L3-L4) measures 27 x 21 mm (image 29, series 2) compared to 25 x 18 mm on prior remeasured. No new soft tissue lesions identified.  IMPRESSION: 1. Continue mild interval increase in size of LEFT paraspinal mass (1-2 mm). 2. No evidence of new metastatic disease in the abdomen pelvis. 3. Post hysterectomy anatomy.  RADIATION TX 10/22/2016 to 10/28/2016:  The L-spine was treated to 27 Gy in 3 fractions at 9 Gy per fraction.    CT 05/09/17: Musculoskeletal: Previously noted paraspinal mass adjacent to the left side of L3-L4 is far less distinct than prior examinations, now an amorphous area of slightly heterogeneous enhancement. Increasing amounts of low-attenuation the adjacent paraspinal musculature, likely to reflect evolving fatty degeneration from prior radiation therapy. There are no aggressive appearing lytic or blastic lesions noted in the visualized portions of the skeleton.  IMPRESSION: 1. Paraspinal mass adjacent to the left side of L3-L4 is less distinct than prior examinations, and  is therefore difficult to discretely measure, however, the overall appearance suggests continued positive response to therapy. 2. No new signs of metastatic disease elsewhere in the abdomen or pelvis. 3. Aortic atherosclerosis, in addition to at least right coronary artery disease. Assessment for potential risk factor modification, dietary therapy or pharmacologic therapy may be warranted, if clinically  indicated. 4. Colonic diverticulosis without evidence of acute diverticulitis at this time. 5. Additional incidental findings, as above.  Radiology  CT 10/28/2017 to review - FINDINGS: Lower Chest: No acute findings. Hepatobiliary: No hepatic masses identified. Multiple small gallstones are again seen, without evidence of cholecystitis or biliary ductal dilatation. Pancreas:  No mass or inflammatory changes. Spleen: Within normal limits in size and appearance. Adrenals/Urinary Tract: No masses identified. No evidence of hydronephrosis. Unremarkable unopacified urinary bladder. Stomach/Bowel: No evidence of obstruction, inflammatory process or abnormal fluid collections. Diverticulosis is seen mainly involving the sigmoid colon, however there is no evidence of diverticulitis. Vascular/Lymphatic: No pathologically enlarged lymph nodes. No abdominal aortic aneurysm. Aortic atherosclerosis. Reproductive: Prior hysterectomy noted. Adnexal regions are unremarkable in appearance. Other: Small peripherally enhancing soft tissue mass is seen in the left paraspinal region at the level of L3-4. This measures 1.8 x 1.6 cm on image 32/series 2, without significant change compared to prior study. Musculoskeletal:  No suspicious bone lesions identified. IMPRESSION: Stable small left paraspinal soft tissue mass. No new or progressive disease within the abdomen or pelvis. Cholelithiasis.  No radiographic evidence of cholecystitis.  Colonic diverticulosis, without radiographic evidence of diverticulitis.   11/19/17 - MRI spine IMPRESSION: 1. 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 To maintain continuity with the previous pre- and post-treatment lumbar imaging since 2016, a T13 thoracic level with hypoplastic ribs is now designated. Correlation with radiographs is recommended prior to any operative intervention. 2. Stable treated left L3 foraminal paraspinal mass  since 08/14/2017. Stable post treatment enhancement pattern, and post treatment edema and enhancement of the adjacent left psoas muscle. 3. No vertebral or other spinal metastatic disease identified. 4. Multilevel advanced spinal degeneration in the setting of chronic scoliosis. - associated multilevel degenerative vertebral edema and enhancement in the cervical spine, cervicothoracic and thoracolumbar junctions, and the lumbar spine. - associated multilevel degenerative cervical and lumbar spinal stenosis. No spinal cord signal abnormality.  Ct Abdomen Pelvis W Contrast Result Date: 04/29/2018 CLINICAL DATA:  Followup metastatic endometrial carcinoma. EXAM: CT ABDOMEN AND PELVIS WITH CONTRAST TECHNIQUE: Multidetector CT imaging of the abdomen and pelvis was performed using the standard protocol following bolus administration of intravenous contrast. CONTRAST:  144m OMNIPAQUE IOHEXOL 300 MG/ML  SOLN COMPARISON:  10/28/2017 FINDINGS: Lower Chest: No acute findings. Hepatobiliary: No hepatic masses identified. Multiple small gallstones are seen, however there is no evidence of cholecystitis or biliary ductal dilatation. Pancreas:  No mass or inflammatory changes. Spleen: Within normal limits in size and appearance. Adrenals/Urinary Tract: No masses identified. No evidence of hydronephrosis. Stomach/Bowel: No evidence of obstruction, inflammatory process or abnormal fluid collections. Diverticulosis is seen mainly involving the sigmoid colon, however there is no evidence of diverticulitis. Vascular/Lymphatic: No pathologically enlarged lymph nodes. No abdominal aortic aneurysm. Aortic atherosclerosis. Reproductive: Prior hysterectomy noted. Adnexal regions are unremarkable in appearance. No evidence of recurrent mass or ascites. Other: Small left paraspinal mass at level of L3 measures 2.2 x 1.7 cm, without significant change since prior study. Musculoskeletal:  No suspicious bone lesions identified. IMPRESSION:  Stable small left L3 paraspinal soft tissue mass. No evidence of  new or progressive metastatic disease, or other acute findings. Cholelithiasis.  No radiographic evidence of cholecystitis. Colonic diverticulosis, without radiographic evidence of diverticulitis. Electronically Signed   By: Earle Gell M.D.   On: 04/29/2018 15:08   Mr Lumbar Spine W Wo Contrast Result Date: 04/30/2018 CLINICAL DATA:  Continued surveillance treated metastasis. Metastatic endometrial carcinoma. EXAM: MRI LUMBAR SPINE WITHOUT AND WITH CONTRAST TECHNIQUE: Multiplanar and multiecho pulse sequences of the lumbar spine were obtained without and with intravenous contrast. CONTRAST:  52m MULTIHANCE GADOBENATE DIMEGLUMINE 529 MG/ML IV SOLN COMPARISON:  08/14/2017. Total screening MR 11/19/2017. most recent CT abdomen and pelvis 04/29/2018 showed interval stability of disease. FINDINGS: Segmentation: Anomalous segmentation is present, but the numbering scheme used today correlates with priors. Thirteen thoracic vertebrae were noted on the prior screening MR. Alignment: Degenerative scoliosis. The primary curve is levoconvex at the thoracolumbar junction. There is degenerative anterolisthesis L4-5 of 7 mm, stable. Vertebrae:  Post XRT fatty conversion L3 through L5. Conus medullaris and cauda equina: Conus extends to the L2-L3 level. Conus and cauda equina appear normal. Paraspinal and other soft tissues: Known LEFT paraspinal metastasis at L3-4 on the LEFT, 18 x 21 mm cross-section, stable. Central necrosis. Enhancing but atrophic LEFT psoas muscle is inseparable. Disc levels: L1-L2: Asymmetric loss of interspace height on the RIGHT is accompanied by facet arthropathy as well as endplate edema on that side. Subarticular zone and foraminal zone narrowing on the RIGHT are observed. L2-L3: Disc space narrowing without significant impingement. Annular bulge. L3-L4: Asymmetric leftward disc space narrowing. Displacement of the LEFT L3 nerve root  by tumor is unchanged. LEFT subarticular zone narrowing is also present and could affect the L4 nerve root. L4-L5: Grade 1 anterolisthesis. Annular bulge. Asymmetric facet arthropathy. Moderate stenosis. LEFT subarticular zone and foraminal zone narrowing likely affect the L4 and L5 nerve roots. L5-S1: Good disc height and hydration. Facet arthropathy. No impingement. IMPRESSION: Stable post treatment change of the partially necrotic LEFT paravertebral mass at L3-L4. 18 x 21 mm cross-section. Mass effect on the LEFT L3 nerve root redemonstrated. Enhancing and atrophic LEFT psoas is inseparable. Electronically Signed   By: JStaci RighterM.D.   On: 04/30/2018 13:46       Current Meds:  Outpatient Encounter Medications as of 05/29/2018  Medication Sig  . amLODipine (NORVASC) 5 MG tablet Take 5 mg by mouth daily.  .Marland Kitchenatenolol (TENORMIN) 25 MG tablet Take 25 mg by mouth 2 (two) times daily.   . Biotin 5000 MCG TABS Take 5,000 mcg by mouth daily.  . Cyanocobalamin (VITAMIN B-12) 5000 MCG SUBL Place 1 tablet under the tongue daily.   .Marland Kitchenibuprofen (ADVIL,MOTRIN) 200 MG tablet Take 200-400 mg by mouth 2 (two) times daily as needed. Reported on 11/13/2015  . lidocaine-prilocaine (EMLA) cream Apply to [facility] 1-2 hours prior to access as directed  . LORazepam (ATIVAN) 0.5 MG tablet 1 tablet po 30 minutes prior to radiation or MRI  . Magnesium 250 MG TABS Take 250 mg by mouth every Monday, Wednesday, and Friday.   . Multiple Vitamin (MULTIVITAMIN WITH MINERALS) TABS tablet Take 1 tablet by mouth daily.  . potassium gluconate 595 MG TABS tablet Take 595 mg by mouth every Monday, Wednesday, and Friday.  . simvastatin (ZOCOR) 20 MG tablet Take 20 mg by mouth daily.  . [DISCONTINUED] naproxen (NAPROSYN) 500 MG tablet Take 1 tablet (500 mg total) by mouth daily as needed.   No facility-administered encounter medications on file as of 05/29/2018.  Allergy:  Allergies  Allergen Reactions  . Ace Inhibitors  Anaphylaxis  . Codeine Nausea Only  . Demerol Nausea Only  . Dilaudid [Hydromorphone Hcl] Other (See Comments)    "total loss of her mind"    Social Hx:   Social History   Socioeconomic History  . Marital status: Married    Spouse name: [name]  . Number of children: 2  . Years of education: Not on file  . Highest education level: Not on file  Occupational History  . Occupation: retired  Scientific laboratory technician  . Financial resource strain: Not on file  . Food insecurity:    Worry: Not on file    Inability: Not on file  . Transportation needs:    Medical: Not on file    Non-medical: Not on file  Tobacco Use  . Smoking status: Former Smoker    Types: Cigarettes    Last attempt to quit: 05/13/1962    Years since quitting: 56.0  . Smokeless tobacco: Never Used  Substance and Sexual Activity  . Alcohol use: Yes    Comment: ocass  . Drug use: No  . Sexual activity: Never  Lifestyle  . Physical activity:    Days per week: Not on file    Minutes per session: Not on file  . Stress: Not on file  Relationships  . Social connections:    Talks on phone: Not on file    Gets together: Not on file    Attends religious service: Not on file    Active member of club or organization: Not on file    Attends meetings of clubs or organizations: Not on file    Relationship status: Not on file  . Intimate partner violence:    Fear of current or ex partner: Not on file    Emotionally abused: Not on file    Physically abused: Not on file    Forced sexual activity: Not on file  Other Topics Concern  . Not on file  Social History Narrative  . Not on file    Past Surgical Hx:  Past Surgical History:  Procedure Laterality Date  . APPENDECTOMY    . BREAST LUMPECTOMY Right 2000  . FOOT SURGERY     RIGHT  . TONSILLECTOMY    . TOTAL ABDOMINAL HYSTERECTOMY W/ BILATERAL SALPINGOOPHORECTOMY      Past Medical Hx:  Past Medical History:  Diagnosis Date  . Endometrial cancer ([place]) 12/2007   s/p  total abdominal hysterectomy at [facility] - ovaries were also removed   . Family history of breast cancer   . History of breast cancer    right breast -- treated with lumpectomy and radiation therarpy, postoperatively with tamoxifen   . History of radiation therapy 10/22/2016 to 10/28/2016  . Hypercholesterolemia   . Hyperlipidemia   . Hypertension   . Palpitations   . Personal history of radiation therapy 2006  . PVC's (premature ventricular contractions)   . Radiation 07/11/14-08/12/14   left lumbar paraspinal area 55 gray  . S/P radiation therapy 10/28/11-12/05/11   5040 cGy left pelvis  . S/P radiation therapy    Intracavitary brachytherapy of uterus    Family Hx:  Family History  Problem Relation Age of Onset  . Thrombosis Father        coronary thrombosis  . Hypertension Father   . Heart failure Mother   . Coronary artery disease Brother   . Breast cancer Maternal Aunt  dx in her 51s  . Stroke Maternal Uncle   . Stroke Maternal Grandfather    Review of Systems - Oncology  Vitals:  Blood pressure (!) 166/77, pulse 63, temperature 98.1 F (36.7 C), temperature source Oral, resp. rate 18, height 4' 11" (1.499 m), weight 110 lb 8 oz (50.1 kg), SpO2 100 %.  Physical Exam:  General :  Well developed, 78 y.o., female in no apparent distress HEENT:  Normocephalic/atraumatic, symmetric, EOMI, eyelids normal Neck:   Supple, no masses.  Lymphatics:  No cervical/ submandibular/ supraclavicular/ infraclavicular/ inguinal adenopathy Respiratory:  Respirations unlabored, no use of accessory muscles CV:   Deferred Breast:  Deferred Musculoskeletal: No CVA tenderness, normal muscle strength. Abdomen:   Soft, non-tender and nondistended. No evidence of hernia. No masses. Extremities:  No lymphedema, no erythema, non-tender. Skin:   Normal inspection Neuro/Psych:  No focal motor deficit, no abnormal mental status. Normal gait. Normal affect. Alert and oriented to person, place,  and time  Genito Urinary: Vulva: Normal external female genitalia.  Bladder/urethra: Urethral meatus normal in size and location. No lesions or   masses, well supported bladder Speculum exam: Vagina: telangectasia, no lesion, no discharge, no bleeding. Bimanual exam: Cervix/Uterus/Adnexa: Surgically absent  Adnexal region: No masses. Rectovaginal:  Good tone, no masses, no cul de sac nodularity, no parametrial involvement or nodularity.    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  She is s/p resection of pelvic mass, left sided ureterolysis, intra-operative radiation therapy by radiation oncology, omental J flap, and left ureteral stent placement by [facility]rology at [facility]  on 04/02/13.  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   She then unfortunately suffered a recurrence in the left paraspinal space and was treated with Zometa as well as IMRT. Imaging shows that the lesion was initially smaller but slowly grew in size. She subsequently had undergone 3 cycles of dose dense carboplatin with a taxane. Her disease appeared stable with some potential decrease in the metabolic activity. She underwent an additional 3 cycles and had an increase in size of the mass and increasing FDG avidity. In June 2018 she underwent cyberknife to the region.  1. Surveillance plan  Return every 6 months for pelvic with imaging prior  She sees medical oncology every year. 2. Numbness left leg  Dr. Alycia Rossetti has discussed this with her previously and explained likely treatment related  3. We obtained records showing PDL1 negative, [employer] 46 gene panel negative, and ER/PR negative  Face to face time with patient was 15 minutes. Over 50% of this time was spent on counseling  and coordination of care.  Isabel Caprice,  05/30/2018, 9:33 PM  Cc:  Shon Baton, MD (PCP)

## 2018-05-29 ENCOUNTER — Inpatient Hospital Stay: Payer: Medicare Other | Attending: Hematology and Oncology | Admitting: Obstetrics

## 2018-05-29 ENCOUNTER — Encounter: Payer: Self-pay | Admitting: Obstetrics

## 2018-05-29 VITALS — BP 166/77 | HR 63 | Temp 98.1°F | Resp 18 | Ht 59.0 in | Wt 110.5 lb

## 2018-05-29 DIAGNOSIS — Z923 Personal history of irradiation: Secondary | ICD-10-CM | POA: Diagnosis not present

## 2018-05-29 DIAGNOSIS — Z90710 Acquired absence of both cervix and uterus: Secondary | ICD-10-CM | POA: Diagnosis not present

## 2018-05-29 DIAGNOSIS — C541 Malignant neoplasm of endometrium: Secondary | ICD-10-CM | POA: Insufficient documentation

## 2018-05-29 DIAGNOSIS — C7951 Secondary malignant neoplasm of bone: Secondary | ICD-10-CM

## 2018-05-29 DIAGNOSIS — Z9221 Personal history of antineoplastic chemotherapy: Secondary | ICD-10-CM | POA: Insufficient documentation

## 2018-05-29 DIAGNOSIS — Z90722 Acquired absence of ovaries, bilateral: Secondary | ICD-10-CM | POA: Diagnosis not present

## 2018-05-29 NOTE — Patient Instructions (Signed)
1. We will order scans for you to be done in June and then return to see [place] following that to review

## 2018-05-30 ENCOUNTER — Encounter: Payer: Self-pay | Admitting: Obstetrics

## 2018-06-10 ENCOUNTER — Other Ambulatory Visit: Payer: Self-pay | Admitting: Radiation Therapy

## 2018-06-10 DIAGNOSIS — C7951 Secondary malignant neoplasm of bone: Secondary | ICD-10-CM

## 2018-06-17 ENCOUNTER — Inpatient Hospital Stay: Payer: Medicare Other | Attending: Hematology and Oncology

## 2018-06-17 ENCOUNTER — Inpatient Hospital Stay: Payer: Medicare Other

## 2018-06-17 DIAGNOSIS — Z853 Personal history of malignant neoplasm of breast: Secondary | ICD-10-CM

## 2018-06-17 DIAGNOSIS — C7951 Secondary malignant neoplasm of bone: Secondary | ICD-10-CM | POA: Insufficient documentation

## 2018-06-17 DIAGNOSIS — C541 Malignant neoplasm of endometrium: Secondary | ICD-10-CM | POA: Insufficient documentation

## 2018-06-17 DIAGNOSIS — D701 Agranulocytosis secondary to cancer chemotherapy: Secondary | ICD-10-CM

## 2018-06-17 DIAGNOSIS — Z95828 Presence of other vascular implants and grafts: Secondary | ICD-10-CM

## 2018-06-17 DIAGNOSIS — T451X5A Adverse effect of antineoplastic and immunosuppressive drugs, initial encounter: Secondary | ICD-10-CM

## 2018-06-17 DIAGNOSIS — Z452 Encounter for adjustment and management of vascular access device: Secondary | ICD-10-CM | POA: Diagnosis not present

## 2018-06-17 MED ORDER — SODIUM CHLORIDE 0.9% FLUSH
10.0000 mL | Freq: Once | INTRAVENOUS | Status: AC
  Filled 2018-06-17: qty 10

## 2018-06-17 MED ORDER — HEPARIN SOD (PORK) LOCK FLUSH 100 UNIT/ML IV SOLN
500.0000 [IU] | Freq: Once | INTRAVENOUS | Status: AC
  Administered 2018-06-17: 500 [IU]
  Filled 2018-06-17: qty 5

## 2018-06-17 MED ORDER — HEPARIN SOD (PORK) LOCK FLUSH 100 UNIT/ML IV SOLN
500.0000 [IU] | Freq: Once | INTRAVENOUS | Status: AC
  Filled 2018-06-17: qty 5

## 2018-06-17 MED ORDER — SODIUM CHLORIDE 0.9% FLUSH
10.0000 mL | Freq: Once | INTRAVENOUS | Status: AC
  Administered 2018-06-17: 10 mL
  Filled 2018-06-17: qty 10

## 2018-06-17 NOTE — Patient Instructions (Signed)

## 2018-06-19 ENCOUNTER — Other Ambulatory Visit: Payer: Self-pay | Admitting: *Deleted

## 2018-07-02 ENCOUNTER — Other Ambulatory Visit: Payer: Self-pay | Admitting: Internal Medicine

## 2018-07-02 DIAGNOSIS — Z1231 Encounter for screening mammogram for malignant neoplasm of breast: Secondary | ICD-10-CM

## 2018-07-09 ENCOUNTER — Telehealth: Payer: Self-pay | Admitting: *Deleted

## 2018-07-09 NOTE — Telephone Encounter (Signed)
Patient acalled to reschedule her flush appt from 3/18 to 3/26. Patient also asked "why do I still to come see you all when I see Dr. Alvy Bimler? My surgery was 2014." Per Melissa APP and Dr. Denman George patient no longer needs to follow up with GYN ONC just Dr. Alvy Bimler.

## 2018-07-15 DIAGNOSIS — C7951 Secondary malignant neoplasm of bone: Secondary | ICD-10-CM | POA: Diagnosis not present

## 2018-07-15 DIAGNOSIS — M4126 Other idiopathic scoliosis, lumbar region: Secondary | ICD-10-CM | POA: Diagnosis not present

## 2018-08-06 ENCOUNTER — Other Ambulatory Visit: Payer: Self-pay

## 2018-08-06 ENCOUNTER — Inpatient Hospital Stay: Payer: Medicare Other | Attending: Hematology and Oncology

## 2018-08-06 DIAGNOSIS — Z95828 Presence of other vascular implants and grafts: Secondary | ICD-10-CM

## 2018-08-06 DIAGNOSIS — T451X5A Adverse effect of antineoplastic and immunosuppressive drugs, initial encounter: Secondary | ICD-10-CM

## 2018-08-06 DIAGNOSIS — C7951 Secondary malignant neoplasm of bone: Secondary | ICD-10-CM | POA: Diagnosis not present

## 2018-08-06 DIAGNOSIS — Z452 Encounter for adjustment and management of vascular access device: Secondary | ICD-10-CM | POA: Insufficient documentation

## 2018-08-06 DIAGNOSIS — D701 Agranulocytosis secondary to cancer chemotherapy: Secondary | ICD-10-CM

## 2018-08-06 DIAGNOSIS — Z853 Personal history of malignant neoplasm of breast: Secondary | ICD-10-CM

## 2018-08-06 DIAGNOSIS — C541 Malignant neoplasm of endometrium: Secondary | ICD-10-CM | POA: Insufficient documentation

## 2018-08-06 MED ORDER — SODIUM CHLORIDE 0.9% FLUSH
10.0000 mL | Freq: Once | INTRAVENOUS | Status: AC
  Administered 2018-08-06: 10 mL
  Filled 2018-08-06: qty 10

## 2018-08-06 MED ORDER — HEPARIN SOD (PORK) LOCK FLUSH 100 UNIT/ML IV SOLN
250.0000 [IU] | Freq: Once | INTRAVENOUS | Status: AC
  Administered 2018-08-06: 250 [IU]
  Filled 2018-08-06: qty 5

## 2018-08-17 ENCOUNTER — Ambulatory Visit: Payer: Medicare Other

## 2018-08-21 ENCOUNTER — Other Ambulatory Visit: Payer: Self-pay | Admitting: Radiation Therapy

## 2018-08-24 ENCOUNTER — Encounter: Payer: Self-pay | Admitting: Hematology and Oncology

## 2018-08-28 ENCOUNTER — Ambulatory Visit (HOSPITAL_COMMUNITY): Payer: Medicare Other

## 2018-08-31 ENCOUNTER — Ambulatory Visit: Payer: Self-pay | Admitting: Radiation Oncology

## 2018-09-20 ENCOUNTER — Encounter

## 2018-09-28 ENCOUNTER — Ambulatory Visit: Payer: Medicare Other

## 2018-09-30 ENCOUNTER — Other Ambulatory Visit: Payer: Self-pay

## 2018-09-30 ENCOUNTER — Inpatient Hospital Stay: Payer: Medicare Other

## 2018-09-30 ENCOUNTER — Inpatient Hospital Stay: Payer: Medicare Other | Attending: Hematology and Oncology

## 2018-09-30 DIAGNOSIS — C7951 Secondary malignant neoplasm of bone: Secondary | ICD-10-CM | POA: Diagnosis not present

## 2018-09-30 DIAGNOSIS — Z95828 Presence of other vascular implants and grafts: Secondary | ICD-10-CM

## 2018-09-30 DIAGNOSIS — D701 Agranulocytosis secondary to cancer chemotherapy: Secondary | ICD-10-CM

## 2018-09-30 DIAGNOSIS — C541 Malignant neoplasm of endometrium: Secondary | ICD-10-CM

## 2018-09-30 DIAGNOSIS — Z853 Personal history of malignant neoplasm of breast: Secondary | ICD-10-CM

## 2018-09-30 LAB — BASIC METABOLIC PANEL - CANCER CENTER ONLY
Anion gap: 9 (ref 5–15)
BUN: 20 mg/dL (ref 8–23)
CO2: 26 mmol/L (ref 22–32)
Calcium: 9.5 mg/dL (ref 8.9–10.3)
Chloride: 106 mmol/L (ref 98–111)
Creatinine: 0.82 mg/dL (ref 0.44–1.00)
GFR, Est AFR Am: >60 mL/min (ref >60)
GFR, Estimated: >60 mL/min (ref >60)
Glucose, Bld: 79 mg/dL (ref 70–99)
Potassium: 4.4 mmol/L (ref 3.5–5.1)
Sodium: 141 mmol/L (ref 135–145)

## 2018-09-30 MED ORDER — HEPARIN SOD (PORK) LOCK FLUSH 100 UNIT/ML IV SOLN
500.0000 [IU] | Freq: Once | INTRAVENOUS | Status: AC
  Administered 2018-09-30: 500 [IU]
  Filled 2018-09-30: qty 5

## 2018-09-30 MED ORDER — SODIUM CHLORIDE 0.9% FLUSH
10.0000 mL | Freq: Once | INTRAVENOUS | Status: AC
  Administered 2018-09-30: 10 mL
  Filled 2018-09-30: qty 10

## 2018-10-15 ENCOUNTER — Other Ambulatory Visit: Payer: Self-pay

## 2018-10-15 ENCOUNTER — Ambulatory Visit (HOSPITAL_COMMUNITY)
Admission: RE | Admit: 2018-10-15 | Discharge: 2018-10-15 | Disposition: A | Discharge status: Home / Self Care | Payer: Medicare Other | Source: Ambulatory Visit | Attending: Radiation Oncology | Admitting: Radiation Oncology

## 2018-10-15 DIAGNOSIS — C7951 Secondary malignant neoplasm of bone: Secondary | ICD-10-CM | POA: Diagnosis not present

## 2018-10-15 DIAGNOSIS — C541 Malignant neoplasm of endometrium: Secondary | ICD-10-CM | POA: Diagnosis not present

## 2018-10-15 MED ORDER — GADOBUTROL 1 MMOL/ML IV SOLN
5.0000 mL | Freq: Once | INTRAVENOUS | Status: AC | PRN
  Administered 2018-10-15: 5 mL via INTRAVENOUS

## 2018-10-16 DIAGNOSIS — I10 Essential (primary) hypertension: Secondary | ICD-10-CM | POA: Diagnosis not present

## 2018-10-16 DIAGNOSIS — M545 Low back pain: Secondary | ICD-10-CM | POA: Diagnosis not present

## 2018-10-16 DIAGNOSIS — C7951 Secondary malignant neoplasm of bone: Secondary | ICD-10-CM | POA: Diagnosis not present

## 2018-10-19 ENCOUNTER — Telehealth: Payer: Self-pay | Admitting: Radiation Oncology

## 2018-10-19 ENCOUNTER — Inpatient Hospital Stay: Payer: Medicare Other | Attending: Hematology and Oncology

## 2018-10-19 ENCOUNTER — Other Ambulatory Visit: Payer: Self-pay | Admitting: Radiation Therapy

## 2018-10-19 DIAGNOSIS — Z853 Personal history of malignant neoplasm of breast: Secondary | ICD-10-CM | POA: Insufficient documentation

## 2018-10-19 DIAGNOSIS — I10 Essential (primary) hypertension: Secondary | ICD-10-CM | POA: Insufficient documentation

## 2018-10-19 DIAGNOSIS — C7951 Secondary malignant neoplasm of bone: Secondary | ICD-10-CM

## 2018-10-19 DIAGNOSIS — R911 Solitary pulmonary nodule: Secondary | ICD-10-CM | POA: Insufficient documentation

## 2018-10-19 DIAGNOSIS — C541 Malignant neoplasm of endometrium: Secondary | ICD-10-CM | POA: Insufficient documentation

## 2018-10-19 NOTE — Telephone Encounter (Signed)
LM for pt to let her know MRI results and discussion from conference this am. We will plan to repeat scans in 4 months.

## 2018-10-26 ENCOUNTER — Other Ambulatory Visit: Payer: Self-pay | Admitting: Hematology and Oncology

## 2018-10-26 DIAGNOSIS — C541 Malignant neoplasm of endometrium: Secondary | ICD-10-CM

## 2018-10-28 ENCOUNTER — Ambulatory Visit (HOSPITAL_COMMUNITY): Payer: Medicare Other

## 2018-10-30 ENCOUNTER — Ambulatory Visit: Payer: Medicare Other | Admitting: Obstetrics

## 2018-11-03 ENCOUNTER — Ambulatory Visit (HOSPITAL_COMMUNITY)
Admission: RE | Admit: 2018-11-03 | Discharge: 2018-11-03 | Disposition: A | Discharge status: Home / Self Care | Payer: Medicare Other | Source: Ambulatory Visit | Attending: Obstetrics | Admitting: Obstetrics

## 2018-11-03 ENCOUNTER — Other Ambulatory Visit: Payer: Self-pay

## 2018-11-03 ENCOUNTER — Encounter (HOSPITAL_COMMUNITY): Payer: Self-pay

## 2018-11-03 DIAGNOSIS — C7951 Secondary malignant neoplasm of bone: Secondary | ICD-10-CM | POA: Diagnosis not present

## 2018-11-03 DIAGNOSIS — C801 Malignant (primary) neoplasm, unspecified: Secondary | ICD-10-CM | POA: Diagnosis not present

## 2018-11-03 DIAGNOSIS — C541 Malignant neoplasm of endometrium: Secondary | ICD-10-CM | POA: Insufficient documentation

## 2018-11-03 DIAGNOSIS — K5730 Diverticulosis of large intestine without perforation or abscess without bleeding: Secondary | ICD-10-CM | POA: Diagnosis not present

## 2018-11-03 MED ORDER — IOHEXOL 300 MG/ML  SOLN
100.0000 mL | Freq: Once | INTRAMUSCULAR | Status: AC | PRN
  Administered 2018-11-03: 100 mL via INTRAVENOUS

## 2018-11-03 MED ORDER — HEPARIN SOD (PORK) LOCK FLUSH 100 UNIT/ML IV SOLN
500.0000 [IU] | Freq: Once | INTRAVENOUS | Status: AC
  Administered 2018-11-03: 500 [IU] via INTRAVENOUS

## 2018-11-03 MED ORDER — HEPARIN SOD (PORK) LOCK FLUSH 100 UNIT/ML IV SOLN
INTRAVENOUS | Status: AC
  Filled 2018-11-03: qty 5

## 2018-11-03 MED ORDER — SODIUM CHLORIDE (PF) 0.9 % IJ SOLN
INTRAMUSCULAR | Status: AC
  Filled 2018-11-03: qty 50

## 2018-11-04 ENCOUNTER — Inpatient Hospital Stay: Payer: Medicare Other

## 2018-11-04 ENCOUNTER — Telehealth: Payer: Self-pay | Admitting: Oncology

## 2018-11-04 ENCOUNTER — Other Ambulatory Visit: Payer: Self-pay

## 2018-11-04 ENCOUNTER — Inpatient Hospital Stay (HOSPITAL_BASED_OUTPATIENT_CLINIC_OR_DEPARTMENT_OTHER): Payer: Medicare Other | Admitting: Hematology and Oncology

## 2018-11-04 ENCOUNTER — Other Ambulatory Visit: Payer: Self-pay | Admitting: Hematology and Oncology

## 2018-11-04 ENCOUNTER — Encounter: Payer: Self-pay | Admitting: Hematology and Oncology

## 2018-11-04 DIAGNOSIS — C541 Malignant neoplasm of endometrium: Secondary | ICD-10-CM

## 2018-11-04 DIAGNOSIS — I10 Essential (primary) hypertension: Secondary | ICD-10-CM

## 2018-11-04 DIAGNOSIS — T451X5A Adverse effect of antineoplastic and immunosuppressive drugs, initial encounter: Secondary | ICD-10-CM

## 2018-11-04 DIAGNOSIS — Z853 Personal history of malignant neoplasm of breast: Secondary | ICD-10-CM | POA: Diagnosis not present

## 2018-11-04 DIAGNOSIS — D701 Agranulocytosis secondary to cancer chemotherapy: Secondary | ICD-10-CM

## 2018-11-04 DIAGNOSIS — R911 Solitary pulmonary nodule: Secondary | ICD-10-CM

## 2018-11-04 DIAGNOSIS — C7951 Secondary malignant neoplasm of bone: Secondary | ICD-10-CM

## 2018-11-04 DIAGNOSIS — C3491 Malignant neoplasm of unspecified part of right bronchus or lung: Secondary | ICD-10-CM

## 2018-11-04 DIAGNOSIS — Z95828 Presence of other vascular implants and grafts: Secondary | ICD-10-CM

## 2018-11-04 DIAGNOSIS — C3490 Malignant neoplasm of unspecified part of unspecified bronchus or lung: Secondary | ICD-10-CM

## 2018-11-04 LAB — COMPREHENSIVE METABOLIC PANEL
ALT: 19 U/L (ref 0–44)
AST: 29 U/L (ref 15–41)
Albumin: 3.8 g/dL (ref 3.5–5.0)
Alkaline Phosphatase: 45 U/L (ref 38–126)
Anion gap: 10 (ref 5–15)
BUN: 17 mg/dL (ref 8–23)
CO2: 26 mmol/L (ref 22–32)
Calcium: 9.5 mg/dL (ref 8.9–10.3)
Chloride: 106 mmol/L (ref 98–111)
Creatinine, Ser: 0.81 mg/dL (ref 0.44–1.00)
GFR calc Af Amer: >60 mL/min (ref >60)
GFR calc non Af Amer: >60 mL/min (ref >60)
Glucose, Bld: 88 mg/dL (ref 70–99)
Potassium: 4.1 mmol/L (ref 3.5–5.1)
Sodium: 142 mmol/L (ref 135–145)
Total Bilirubin: 0.8 mg/dL (ref 0.3–1.2)
Total Protein: 7.1 g/dL (ref 6.5–8.1)

## 2018-11-04 LAB — CBC WITH DIFFERENTIAL/PLATELET
Abs Immature Granulocytes: 0.03 10*3/uL (ref 0.00–0.07)
Basophils Absolute: 0 10*3/uL (ref 0.0–0.1)
Basophils Relative: 1 %
Eosinophils Absolute: 0.3 10*3/uL (ref 0.0–0.5)
Eosinophils Relative: 4 %
HCT: 37.8 % (ref 36.0–46.0)
Hemoglobin: 12.5 g/dL (ref 12.0–15.0)
Immature Granulocytes: 1 %
Lymphocytes Relative: 23 %
Lymphs Abs: 1.4 10*3/uL (ref 0.7–4.0)
MCH: 31.5 pg (ref 26.0–34.0)
MCHC: 33.1 g/dL (ref 30.0–36.0)
MCV: 95.2 fL (ref 80.0–100.0)
Monocytes Absolute: 1 10*3/uL (ref 0.1–1.0)
Monocytes Relative: 15 %
Neutro Abs: 3.6 10*3/uL (ref 1.7–7.7)
Neutrophils Relative %: 56 %
Platelets: 183 10*3/uL (ref 150–400)
RBC: 3.97 MIL/uL (ref 3.87–5.11)
RDW: 13.2 % (ref 11.5–15.5)
WBC: 6.3 10*3/uL (ref 4.0–10.5)
nRBC: 0 % (ref 0.0–0.2)

## 2018-11-04 MED ORDER — SODIUM CHLORIDE 0.9% FLUSH
10.0000 mL | Freq: Once | INTRAVENOUS | Status: AC
  Administered 2018-11-04: 10 mL
  Filled 2018-11-04: qty 10

## 2018-11-04 MED ORDER — HEPARIN SOD (PORK) LOCK FLUSH 100 UNIT/ML IV SOLN
250.0000 [IU] | Freq: Once | INTRAVENOUS | Status: AC
  Administered 2018-11-04: 500 [IU]
  Filled 2018-11-04: qty 5

## 2018-11-04 NOTE — Telephone Encounter (Signed)
[name] with PET scan appointment on 11/11/18 at 9 am (8:30 am arrival, NPO after midnight).  She verbalized understanding and agreement.

## 2018-11-04 NOTE — Progress Notes (Signed)
[place] OFFICE PROGRESS NOTE  Patient Care Team: Shon Baton, MD as PCP - General (Internal Medicine) Heath Lark, MD as Consulting Physician (Hematology and Oncology) Heath Lark, MD as Consulting Physician (Hematology and Oncology) Nancy Marus, MD as Attending Physician (Gynecologic Oncology)  ASSESSMENT & PLAN:  Primary lung cancer, right [facility]) I have reviewed multiple CT imaging with the patient Unfortunately, she has new growth in the right lung nodule, suspicious for primary lung cancer Given her history of endometrial cancer and breast cancer, metastatic disease cannot be excluded I recommend PET CT scan and referral to cardiothoracic surgery for further evaluation  Metastatic cancer to bone [facility]) Her most recent MRI is stable We will continue serial MRI imaging to follow  White coat syndrome with diagnosis of hypertension She has significant elevated blood pressure that she attributed to whitecoat hypertension She will continue close follow-up with primary care doctor   Orders Placed This Encounter  Procedures  . NM PET Image Restag (PS) Skull Base To Thigh    Standing Status:   Future    Standing Expiration Date:   11/04/2019    Order Specific Question:   If indicated for the ordered procedure, I authorize the administration of a radiopharmaceutical per Radiology protocol    Answer:   Yes    Order Specific Question:   Preferred imaging location?    Answer:   [facility]    Order Specific Question:   Radiology Contrast Protocol - do NOT remove file path    Answer:   [email or web address]  . Ambulatory referral to Cardiothoracic Surgery    Referral Priority:   Routine    Referral Type:   Surgical    Referral Reason:   Specialty Services Required    Referred to Provider:   Melrose Nakayama, MD    Requested Specialty:   Cardiothoracic Surgery    Number of Visits Requested:   1    INTERVAL HISTORY: Please see  below for problem oriented charting. She returns for further follow-up on her history of multiple cancers She continues to have chronic lower back pain but stable She denies recent cough, chest pain or shortness of breath Her appetite is stable and she denies recent weight loss  SUMMARY OF ONCOLOGIC HISTORY: Oncology History Overview Note  Biopsy of recurrence 06-2014 ER negative. PDL1 testing low at 2% HER 2 negative on pathology [id number] Negative genetic testing   Malignant neoplasm of corpus uteri, except isthmus ([place])  12/22/2007 Surgery   Staging for IA UPSC    - 05/28/2008 Chemotherapy   6 cycles of paclitaxel and carboplatin with vaginal cuff brachytherapy   03/22/2011 Relapse/Recurrence   Left pelvic node recurrence   04/25/2011 Surgery   L/S evaluation, not resectable.   05/16/2011 - 09/25/2011 Chemotherapy   6 cyclces paclitaxel and carboplatin    - 12/27/2011 Radiation Therapy   radiation completed   03/10/2013 Progression   progression left pelvic mass   04/02/2013 Surgery   Resection and IORT   06/13/2014 Relapse/Recurrence   soft tissue mass at L3-4   07/11/2014 - 08/12/2014 Radiation Therapy   IMRT + zometa   02/12/2016 Progression      - 01/18/2016 Chemotherapy   The patient had ondansetron (ZOFRAN) IVPB 16 mg, 16 mg (100 % of original dose 16 mg), Intravenous,  Once, 5 of 5 cycles Dose modification: 16 mg (original dose 16 mg, Cycle 2)  CARBOplatin (PARAPLATIN) 390 mg in sodium chloride 0.9 % 100 mL chemo infusion,  390 mg (100 % of original dose 392 mg), Intravenous,  Once, 5 of 5 cycles Dose modification: 392 mg (original dose 392 mg, Cycle 1), 446.5 mg (original dose 392 mg, Cycle 2), 390 mg (original dose 392 mg, Cycle 2), 342 mg (original dose 392 mg, Cycle 6)  CARBOplatin chemo intradermal Test Dose 100 mcg, 0.1 mg, Intradermal,  Once, 5 of 5 cycles  PACLitaxel (TAXOL) 252 mg in dextrose 5 % 250 mL chemo infusion (> 51m/m2), 175 mg/m2 = 252 mg,  Intravenous,  Once, 6 of 6 cycles Dose modification: 135 mg/m2 (original dose 175 mg/m2, Cycle 5)  CARBOplatin (PARAPLATIN) 250 mg in sodium chloride 0.9 % 100 mL chemo infusion, 250 mg (100 % of original dose 254.4 mg), Intravenous,  Once, 6 of 6 cycles Dose modification:   (original dose 254.4 mg, Cycle 1)  PACLitaxel (TAXOL) 114 mg in dextrose 5 % 250 mL chemo infusion ( ondansetron (ZOFRAN) 16 mg, dexamethasone (DECADRON) 20 mg in sodium chloride 0.9 % 50 mL IVPB, , Intravenous,  Once, 6 of 6 cycles  for chemotherapy treatment.     Endometrial cancer ([place]  12/01/2007 Initial Diagnosis   She has recurrent papillary serous endometrial carcinoma. Briefly she was diagnosed in 2009 with a FIGO IB pappilary serous carcinoma treated with hysterectomy followed by adjuvant chemo and vaginal brachytherapy. She then had a recurrence diagnosed in late 2012 that was deemed not to be surgically resectable, and was treated with 6 cycles of carbo/taxol followed by 5040 cGy of EBRT to the pelvic mass. She was then followed with serial imaging and was noted in June of this year to have slight increase in the size of the mass, and again in September there was small enlargement of the mass. She had a re-staging PET scan on 03/10/13 that showed FDG activity in the pelvic mass, with no other areas of disease   12/01/2007 Imaging   CT scan of chest, abdomen and pelvis: 1.  Mass like area of decreased attenuation in the central uterus, consistent with the known endometrial carcinoma.  Deep myometrial invasion is suspected.  Pelvic MRI without and with contrast could be performed for further staging workup if clinically warranted. 2.  No evidence of extrauterine extension or lymphadenopathy. 3.  Sigmoid diverticulosis incidentally noted.   12/15/2007 Surgery   --12/2007 laparoscopic hysterectomy and PLND --05/2008 complted 6 cycles adjuvant carbo/taxol followed by vaginal cuff brachy --03/2011 exploratory lararoscopy,  ureterolysis --4/13 completed 6 cycels carbotaxol --8/13 completed EBRT to the left pelvic sidewall    06/10/2008 Imaging   CT scan abdomen and pelvis 1.  Nonspecific mildly prominent inguinal lymph nodes, right greater than left. 2.  Interval hysterectomy without evidence of recurrent pelvic mass or fluid collection.   03/22/2011 Imaging   Ct scan abdomen and pelvis 1.  Local uterine cancer recurrence with two large nodes along the left pelvic sidewall. 2.  No evidence of bowel obstruction, urinary obstruction, or more distant metastasis.   03/31/2011 Relapse/Recurrence   chemotherapy and external beam radiation   05/16/2011 - 09/17/2011 Chemotherapy   She had 6 cycles of carboplatin and Taxol    07/17/2011 Imaging   Ct scan abdomen and pelvis 1.  Interval improvement in the previously demonstrated left pelvic local recurrence of tumor. 2.  No disease progression or complication identified. 3.  Mild bladder wall thickening on the right, nonspecific and possibly related to incomplete distension and/or radiation therapy. 4.  Cholelithiasis   10/11/2011 Imaging   CT scan abdomen  and pelvis 1.  Interval enlargement of the left pelvic sidewall masses. 2.  No new nodular disease in the pelvis or lymphadenopathy. 3.  No evidence  of distant metastasis. 4.  Mild hydronephrosis on the right is similar to prior. 5.  Focal thickening of the right aspect the bladder is stable compared to prior.    10/28/2011 - 12/05/2011 Radiation Therapy   radiation for pelvic sidewall recurrence   10/28/2011 - 12/05/2011 Radiation Therapy   10/28/11-12/05/11: Radiotherapy to the left pelvic sidewall   03/18/2012 Imaging   CT abdomen 1.  Today's study demonstrates a positive response to therapy with decreased size of left pelvic sidewall nodal masses, as detailed above.  No new soft tissue masses or new lymphadenopathy is identified within the abdomen or pelvis. Continue attention on follow-up studies is  recommended. 2.  Cholelithiasis without findings to suggest acute cholecystitis. 3.  Colonic diverticulosis without findings to suggest acute diverticulitis at this time. 4.  Mild asymmetric urinary bladder wall thickening is unchanged compared to prior examinations and is nonspecific.  No definite bladder wall mass is identified at this time. 5.  Additional findings, similar to prior examinations, as above   06/22/2012 Imaging   CT abdomen 1.  Further decreased size of the left pelvic peripherally enhancing lesion. 2.  No new sites of new or progressive disease. 3. Esophageal air fluid level suggests dysmotility or gastroesophageal reflux. 4.  Cholelithiasis   11/02/2012 Imaging   CT abdomen 1.  The left pelvic side wall lesion demonstrates mild increase in size from previous exam. 2.  No new sites of new or progressive disease. 3.  Cholelithiasis.    01/28/2013 Imaging   CT abdomen 1. Interval small increase in volume of enhancing mass adjacent to the left pelvic sidewall. 2. No evidence of abdominal or pelvic lymphadenopathy   04/02/2013 Surgery   pelvic mass resection with IORT   04/02/2013 - 04/02/2013 Radiation Therapy   04/02/13: Intraoperative radiotherapy to the left pelvis   05/10/2013 PET scan   1. Hypermetabolic mass in the deep left pelvis consistent with metastasis. 2. No additional evidence of local metastasis. No evidence of distant metastasis. 3. Small right lower lobe pulmonary nodule is not hypermetabolic.   07/27/2013 Imaging   No evidence of recurrent or metastatic disease. Apparent prior resection of the deep left pelvic metastasis.   02/08/2014 Imaging   No CT findings for recurrent or metastatic disease involving the abdomen/ pelvis   06/13/2014 Relapse/Recurrence   + recurrence paraspinous muscle   06/21/2014 Procedure   There is a soft tissue lesion along the left side of L3-L4. This lesion roughly measures up to 2.2 cm. Needle was positioned along the  posterior aspect of the lesion. Small amount of air in the paraspinal tissues following needle removal   06/21/2014 Pathology Results   Bone, biopsy, left lumbar paraspinal - METASTATIC POORLY DIFFERENTIATED CARCINOMA, CONSISTENT WITH HIGH GRADE SEROUS CARCINOMA SEE COMMENT. Microscopic Comment The carcinoma demonstrates the following immunophenotype: Cytokeratin 7 - patchy moderate to strong expression Estrogen receptor - negative expression P53 - strong diffuse expression TTF-1 - negative expression WT-1 - negative expression CD56 - focal moderate strong expression Synaptophysin - negative expression GCDFP - negative expression The history of primary endometrial papillary serous carcinoma and primary mammary carcinoma is noted. In the current case, the overall morphologic and immunophenotype are that of poorly differentiated carcinoma, consistent with high grade serous carcinoma.    07/11/2014 - 08/12/2014 Radiation Therapy   07/11/14-08/12/14: 55 Gy  in 25 fractions to the Lumbar spine   08/15/2014 Imaging   CT scan of chest, abdomen and pelvis 1. Since the biopsy study of 06/21/2014, similar to slight decrease in size of a left paraspinous lesion at L3-4. 2. No new sites of metastatic disease identified. 3. 8 mm ground-glass nodule in the right lower lobe is grossly similar to 03/10/2013, suggesting a benign etiology. 4. Cholelithiasis. 5. Apparent sigmoid colonic wall thickening which could be due to underdistention. Colitis felt less likely. 6.  Atherosclerosis, including within the coronary arteries. 7. Pelvic floor laxity.   10/07/2014 Imaging   CT scan of chest, abdomen and pelvis: Stable to slight interval decrease in size of left paraspinous lesion at L3-4. No new sites of metastatic disease. Unchanged ground-glass nodule within the right lower lobe, potentially benign in etiology. Recommend attention on followup. Cholelithiasis. Sigmoid colonic diverticulosis. No CT evidence to  suggest acute diverticulitis.   01/19/2015 Imaging   Ct scan of abdomen and pelvis 1. Decrease in size of left paraspinous metastasis. 2. No new sites of disease. 3. Stable ground-glass attenuating nodule within the superior segment of right lower lobe. 4. Gallstones   05/02/2015 Imaging   Ct abdomen and pelvis: Slight interval increase in size of left lumbar paraspinal soft tissue metastasis. No new sites of metastatic disease identified within the abdomen or pelvis.    05/11/2015 PET scan   1. The small soft tissue mass just lateral to the left L3 for neural foramen is hypermetabolic, favoring malignancy. 2. There is also a small focus of hypermetabolic activity between the left L1 and L2 transverse processes, but without a CT correlate. 3. The 13 mm sub solid nodule in the superior segment right lower lobe is stable from recent exams but has slowly increased in size over the last 7 years. Although not hypermetabolic, the appearance is concerning for the possibility of a low grade adenocarcinoma. 4. Other imaging findings of potential clinical significance: Chronic bilateral maxillary sinusitis. Coronary, aortic arch, and branch vessel atherosclerotic vascular disease. Aortoiliac atherosclerotic vascular disease. Cholelithiasis. Sigmoid colon diverticulosis. Lumbar scoliosis.   07/11/2015 Imaging   Ct scan of chest, abdomen and pelvis: 1. 13 mm mixed solid and sub solid nodule in the posterior right lower lobe was present in 2009 and has clearly progressed in the interval since that study. Imaging features remain highly concerning for low-grade or well differentiated adenocarcinoma. 2. Slight increase size of in the hypermetabolic, rim enhancing nodule identified adjacent to the left L3-4 neural foramen. Metastatic disease remains a concern. 3. No evidence for discrete soft tissue lesion between the left L1 and 2 transverse processes, the site of focal FDG uptake on the recent PET-CT. 4.  Cholelithiasis. 5. Abdominal aortic atherosclerosis   08/03/2015 - 01/16/2016 Chemotherapy   She received carboplatin and Taxol   11/06/2015 PET scan   1. Hypermetabolic left B0-9 paraspinous metastasis (biopsy-proven) is stable in size and slightly decreased in metabolism. 2. Previously described small focus of hypermetabolism between the left L2 and L3 spinous processes has resolved. 3. New mild linear hypermetabolism to the left of the T11-12 spinous processes without discrete mass on the CT images, favor benign activity related activity, recommend attention on follow-up PET-CT.  4. No definite new sites of hypermetabolic metastatic disease. No recurrent hypermetabolic metastatic disease in the pelvis. 5. Interval stability of subsolid 1.3 cm superior segment right lower lobe pulmonary nodule without associated significant metabolism, which has grown compared to the 2009 chest CT study, and remain suspicious  for low grade adenocarcinoma. 6. Additional findings include aortic atherosclerosis, coronary atherosclerosis, mild multinodular goiter with no hypermetabolic thyroid nodules, cholelithiasis and moderate sigmoid diverticulosis.   02/12/2016 PET scan   1. Interval increase in size and metabolic activity of the LEFT paraspinal soft tissue metastasis. 2. New activity within the musculature of the LEFT chest wall. Given the unusual location of the paraspinal metastasis cannot exclude a second soft tissue metastasis to the musculature however favor benign physiologic activity. 3. Stable RIGHT lower lobe pulmonary nodule. 4. No evidence of local recurrence.     04/08/2016 Imaging   Ct scan of chest, abdomen and pelvis: 1. Similar size of a  left paravertebral abdominal lesion. 2. No new or progressive metastatic disease. 3. No correlate for the muscular activity about the lateral left chest wall. 4.  Coronary artery atherosclerosis. Aortic atherosclerosis. 5. Cholelithiasis. 6. Similar  right lower lobe pulmonary nodule.   06/03/2016 Imaging   CT abdomen and pelvis 1. Similar to mild enlargement of a paravertebral soft tissue lesion at the L3-4 level. 2. No new sites of disease identified. 3. Cholelithiasis. 4. Hysterectomy. 5.  Aortic atherosclerosis.    09/02/2016 Imaging   CT abdomen and pelvis 1. Continue mild interval increase in size of LEFT paraspinal mass (1-2 mm). 2. No evidence of new metastatic disease in the abdomen pelvis. 3. Post hysterectomy anatomy   09/26/2016 Imaging   MR lumbar spine 1. Left paraspinal metastasis with epicenter adjacent to the left L3 neural foramen does extend into the foramen to the level of the left lateral epidural space (series 9, image 31), but also tracks cephalad and caudal along the L2-L4 lumbar plexus (series 10, image 15). No extension into the left L2 or L4 neural foramina. And no more distal extension of tumor. 2. Abnormal signal in the medial left psoas and ventral left erector spinae muscles at L2 and L3 is probably denervation related. 3. No bone invasion or osseous metastatic disease. Suspect previous radiation of the L2 through L5 spinal levels. 4. No dural or intradural metastatic disease.    10/22/2016 Procedure   She underwent stereotactic Radiosurgery   10/30/2016 Genetic Testing   Patient has genetic testing done for Inheritable genetic mutation panel. Results revealed patient has no actionable mutation.   01/21/2017 Imaging   1. Reduced size and conspicuity of the left paraspinal mass at the L3-4 level. The mass still present but has enhancement similar to that of adjacent psoas musculature. 2. No new metastatic disease is identified. 3. Other imaging findings of potential clinical significance: Cholelithiasis. Aortic Atherosclerosis (ICD10-I70.0). Sigmoid diverticulosis. Lumbar scoliosis.   05/09/2017 Imaging   Ct scan of abdomen and pelvis 1. Paraspinal mass adjacent to the left side of L3-L4 is less  distinct than prior examinations, and is therefore difficult to discretely measure, however, the overall appearance suggests continued positive response to therapy. 2. No new signs of metastatic disease elsewhere in the abdomen or pelvis. 3. Aortic atherosclerosis, in addition to at least right coronary artery disease. Assessment for potential risk factor modification, dietary therapy or pharmacologic therapy may be warranted, if clinically indicated. 4. Colonic diverticulosis without evidence of acute diverticulitis at this time. 5. Additional incidental findings, as above. Aortic Atherosclerosis (ICD10-I70.0).   08/14/2017 Imaging   1. Treated left paraspinal mass centered at L3-4. Mass size is stable but there has been some evolution of tumor characteristics with less central fluid seen today. Recommend continued surveillance. 2. No evidence of untreated metastasis. 3. Degenerative changes and  related impingement are described above.   10/28/2017 Imaging   Stable small left paraspinal soft tissue mass. No new or progressive disease within the abdomen or pelvis.  Cholelithiasis.  No radiographic evidence of cholecystitis.  Colonic diverticulosis, without radiographic evidence of diverticulitis.   04/30/2018 Imaging   Stable post treatment change of the partially necrotic LEFT paravertebral mass at L3-L4. 18 x 21 mm cross-section. Mass effect on the LEFT L3 nerve root redemonstrated. Enhancing and atrophic LEFT psoas is inseparable.   10/15/2018 Imaging   1. Stable post treatment size and appearance of partially necrotic left paravertebral mass at L3-4, measuring 19 x 25 mm on current exam (unchanged when measured at similar level on previous study). Mass effect on the adjacent left L3 nerve root is unchanged. Adjacent enhancing and atrophic left psoas muscle. 2. Left convex scoliosis with associated multilevel degenerative spondylolysis and facet arthrosis, unchanged   10/29/2018 Imaging    Stable small left L3 paraspinal soft tissue mass. No evidence of new or progressive metastatic disease, or other acute findings.   Cholelithiasis.  No radiographic evidence of cholecystitis.   Colonic diverticulosis, without radiographic evidence of diverticulitis.   Metastatic cancer to bone ([place])  06/27/2014 Initial Diagnosis   Metastatic cancer to bone     REVIEW OF SYSTEMS:   Constitutional: Denies fevers, chills or abnormal weight loss Eyes: Denies blurriness of vision Ears, nose, mouth, throat, and face: Denies mucositis or sore throat Respiratory: Denies cough, dyspnea or wheezes Cardiovascular: Denies palpitation, chest discomfort or lower extremity swelling Gastrointestinal:  Denies nausea, heartburn or change in bowel habits Skin: Denies abnormal skin rashes Lymphatics: Denies new lymphadenopathy or easy bruising Neurological:Denies numbness, tingling or new weaknesses Behavioral/Psych: Mood is stable, no new changes  All other systems were reviewed with the patient and are negative.  I have reviewed the past medical history, past surgical history, social history and family history with the patient and they are unchanged from previous note.  ALLERGIES:  is allergic to ace inhibitors; codeine; demerol; and dilaudid [hydromorphone hcl].  MEDICATIONS:  Current Outpatient Medications  Medication Sig Dispense Refill  . amLODipine (NORVASC) 5 MG tablet Take 5 mg by mouth daily.    Marland Kitchen atenolol (TENORMIN) 25 MG tablet Take 25 mg by mouth 2 (two) times daily.     . Biotin 5000 MCG TABS Take 5,000 mcg by mouth daily.    . Cyanocobalamin (VITAMIN B-12) 5000 MCG SUBL Place 1 tablet under the tongue daily.     Marland Kitchen ibuprofen (ADVIL,MOTRIN) 200 MG tablet Take 200-400 mg by mouth 2 (two) times daily as needed. Reported on 11/13/2015    . lidocaine-prilocaine (EMLA) cream Apply to [facility] cath 1-2 hours prior to access as directed 30 g 1  . LORazepam (ATIVAN) 0.5 MG tablet 1 tablet po 30  minutes prior to radiation or MRI 30 tablet 0  . Magnesium 250 MG TABS Take 250 mg by mouth every Monday, Wednesday, and Friday.     . Multiple Vitamin (MULTIVITAMIN WITH MINERALS) TABS tablet Take 1 tablet by mouth daily.    . potassium gluconate 595 MG TABS tablet Take 595 mg by mouth every Monday, Wednesday, and Friday.    . simvastatin (ZOCOR) 20 MG tablet Take 20 mg by mouth daily.     No current facility-administered medications for this visit.    Facility-Administered Medications Ordered in Other Visits  Medication Dose Route Frequency Provider Last Rate Last Dose  . heparin lock flush 100 unit/mL  500 Units Intracatheter  Once Heath Lark, MD      . sodium chloride flush (NS) 0.9 % injection 10 mL  10 mL Intracatheter Once Heath Lark, MD        PHYSICAL EXAMINATION: ECOG PERFORMANCE STATUS: 1 - Symptomatic but completely ambulatory  Vitals:   11/04/18 0959  BP: (!) 176/81  Pulse: 66  Resp: 17  Temp: 97.7 F (36.5 C)  SpO2: 100%   Filed Weights   11/04/18 0959  Weight: 111 lb 9.6 oz (50.6 kg)    GENERAL:alert, no distress and comfortable SKIN: skin color, texture, turgor are normal, no rashes or significant lesions EYES: normal, Conjunctiva are pink and non-injected, sclera clear OROPHARYNX:no exudate, no erythema and lips, buccal mucosa, and tongue normal  NECK: supple, thyroid normal size, non-tender, without nodularity LYMPH:  no palpable lymphadenopathy in the cervical, axillary or inguinal LUNGS: clear to auscultation and percussion with normal breathing effort HEART: regular rate & rhythm and no murmurs and no lower extremity edema ABDOMEN:abdomen soft, non-tender and normal bowel sounds Musculoskeletal:no cyanosis of digits and no clubbing  NEURO: alert & oriented x 3 with fluent speech, no focal motor/sensory deficits  LABORATORY DATA:  I have reviewed the data as listed    Component Value Date/Time   NA 141 09/30/2018 0932   NA 140 05/09/2017 0844   K  4.4 09/30/2018 0932   K 4.1 05/09/2017 0844   CL 106 09/30/2018 0932   CO2 26 09/30/2018 0932   CO2 26 05/09/2017 0844   GLUCOSE 79 09/30/2018 0932   GLUCOSE 82 05/09/2017 0844   BUN 20 09/30/2018 0932   BUN 20.7 05/09/2017 0844   CREATININE 0.82 09/30/2018 0932   CREATININE 0.7 05/09/2017 0844   CALCIUM 9.5 09/30/2018 0932   CALCIUM 9.3 05/09/2017 0844   PROT 6.6 10/27/2017 1356   PROT 6.8 05/09/2017 0844   ALBUMIN 3.8 10/27/2017 1356   ALBUMIN 3.6 05/09/2017 0844   AST 42 (H) 10/27/2017 1356   AST 27 05/09/2017 0844   ALT 23 10/27/2017 1356   ALT 17 05/09/2017 0844   ALKPHOS 49 10/27/2017 1356   ALKPHOS 43 05/09/2017 0844   BILITOT 0.8 10/27/2017 1356   BILITOT 0.81 05/09/2017 0844   GFRNONAA >60 09/30/2018 0932   GFRAA >60 09/30/2018 0932    No results found for: SPEP, UPEP  Lab Results  Component Value Date   WBC 6.3 11/04/2018   NEUTROABS 3.6 11/04/2018   HGB 12.5 11/04/2018   HCT 37.8 11/04/2018   MCV 95.2 11/04/2018   PLT 183 11/04/2018      Chemistry      Component Value Date/Time   NA 141 09/30/2018 0932   NA 140 05/09/2017 0844   K 4.4 09/30/2018 0932   K 4.1 05/09/2017 0844   CL 106 09/30/2018 0932   CO2 26 09/30/2018 0932   CO2 26 05/09/2017 0844   BUN 20 09/30/2018 0932   BUN 20.7 05/09/2017 0844   CREATININE 0.82 09/30/2018 0932   CREATININE 0.7 05/09/2017 0844      Component Value Date/Time   CALCIUM 9.5 09/30/2018 0932   CALCIUM 9.3 05/09/2017 0844   ALKPHOS 49 10/27/2017 1356   ALKPHOS 43 05/09/2017 0844   AST 42 (H) 10/27/2017 1356   AST 27 05/09/2017 0844   ALT 23 10/27/2017 1356   ALT 17 05/09/2017 0844   BILITOT 0.8 10/27/2017 1356   BILITOT 0.81 05/09/2017 0844       RADIOGRAPHIC STUDIES: I have reviewed multiple imaging studies with  the patient I have personally reviewed the radiological images as listed and agreed with the findings in the report. Ct Chest W Contrast  Result Date: 11/03/2018 CLINICAL DATA:  History of  endometrial cancer with multiple recurrences. Restaging. EXAM: CT CHEST, ABDOMEN, AND PELVIS WITH CONTRAST TECHNIQUE: Multidetector CT imaging of the chest, abdomen and pelvis was performed following the standard protocol during bolus administration of intravenous contrast. CONTRAST:  150m OMNIPAQUE IOHEXOL 300 MG/ML  SOLN COMPARISON:  04/29/2018 CT abdomen/pelvis. 04/08/2016 CT chest, abdomen and pelvis. FINDINGS: CT CHEST FINDINGS Cardiovascular: Top-normal heart size. No significant pericardial effusion/thickening. Left anterior descending and right coronary atherosclerosis. Left internal jugular Port-A-Cath terminates in the lower third of the SVC. Atherosclerotic nonaneurysmal thoracic aorta. Normal caliber pulmonary arteries. No central pulmonary emboli. Mediastinum/Nodes: Stable multinodular goiter with subcentimeter hypodense bilateral thyroid nodules, some of which are partially calcified on the right. Unremarkable esophagus. Surgical clips are again noted in the right axilla. No pathologically enlarged axillary, mediastinal or hilar lymph nodes. Lungs/Pleura: No pneumothorax. No pleural effusion. Irregular subsolid 2.1 x 1.5 cm superior segment right lower lobe pulmonary nodule is increased in size and density, previously 1.0 x 0.6 cm on 04/08/2016 chest CT and 0.8 x 0.5 cm on 12/01/2007 chest CT, and now crosses the major fissure into posterior right upper lobe (series 6/image 64). Stable mild sharply marginated patchy subpleural reticulation in the anterior mid to upper right lung compatible with minimal radiation fibrosis. No acute consolidative airspace disease or additional significant pulmonary nodules. Musculoskeletal: No aggressive appearing focal osseous lesions. Mild thoracic spondylosis. CT ABDOMEN PELVIS FINDINGS Hepatobiliary: Normal liver with no liver mass. Cholelithiasis. No biliary ductal dilatation. Pancreas: Normal, with no mass or duct dilation. Spleen: Normal size. No mass.  Adrenals/Urinary Tract: Normal adrenals. Normal kidneys with no hydronephrosis and no renal mass. Chronic mild diffuse bladder wall thickening, not definitely changed. Stomach/Bowel: Normal non-distended stomach. Normal caliber small bowel with no small bowel wall thickening. Appendix not discretely visualized. Moderate sigmoid diverticulosis, with no definite large bowel wall thickening or significant pericolonic fat stranding. Oral contrast transits to the rectum. Vascular/Lymphatic: Atherosclerotic nonaneurysmal abdominal aorta. Patent portal, splenic, hepatic and renal veins. No pathologically enlarged lymph nodes in the abdomen or pelvis. Reproductive: Status post hysterectomy, with no abnormal findings at the vaginal cuff. No adnexal mass. Other: No pneumoperitoneum, ascites or focal fluid collection. Musculoskeletal: No aggressive appearing focal osseous lesions. Left L3-4 paraspinous 2.4 x 1.7 cm mass with irregular peripheral enhancement (series 2/image 70), previously 2.4 x 1.7 cm on 04/29/2018 CT using similar measurement technique, stable. Marked lumbar spondylosis. IMPRESSION: 1. Subsolid 2.1 cm irregular superior segment right lower lobe pulmonary nodule is increased in size and density since 2017 chest CT, and now crosses the major fissure into the right upper lobe, most compatible with enlarging primary bronchogenic adenocarcinoma given progressive growth since 2009 CT studies. Multidisciplinary thoracic oncology consultation suggested. 2. 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 No new or progressive metastatic disease. No evidence of peritoneal recurrence. 3. Chronic mild diffuse bladder wall thickening, unchanged, probably treatment related. 4. Aortic Atherosclerosis (ICD10-I70.0). Additional chronic findings as detailed. Electronically Signed   By: JIlona SorrelM.D.   On: 11/03/2018 15:41   Mr Lumbar Spine W Wo Contrast  Result Date: 10/15/2018 CLINICAL DATA:  Follow-up examination  for continued surveillance of treated metastasis. Metastatic endometrial carcinoma. EXAM: MRI LUMBAR SPINE WITHOUT AND WITH CONTRAST TECHNIQUE: Multiplanar and multiecho pulse sequences of the lumbar spine were obtained without and with intravenous contrast. CONTRAST:  5 cc of Gadavist. COMPARISON:  Prior MRI from 04/30/2018. FINDINGS: Segmentation: Anomalous segmentation again noted. Same numbering system used today as on previous exams. Alignment: Moderate levoscoliosis at the thoracolumbar junction, stable. 7 mm degenerative anterolisthesis of L4 on L5, with trace 2 mm retrolisthesis of L3 on L4, also unchanged. Vertebrae: Vertebral body heights maintained without evidence for acute or interval fracture. Post radiation changes with fatty marrow replacement noted at L3 through L5. No new discrete osseous lesions. Conus medullaris and cauda equina: Conus extends to the L1-2 level. Conus and cauda equina appear normal. Paraspinal and other soft tissues: Previously identified left paraspinal metastatic lesion at L3-4 is relatively stable measuring 25 x 19 mm and cross-sectional imaging (series 11, image 20, previously 25 x 19 mm when measured in similar fashion on prior exam). Central necrosis again noted. Lesion is intimately associated with the adjacent left psoas muscle which is atrophic. Overall, appearance is stable. Disc levels: L1-2: Right eccentric degenerative disc bulging with predominant right-sided reactive endplate changes and intervertebral disc space narrowing. Right-sided facet hypertrophy. Secondary moderate narrowing of the right lateral recess and right neural foramen, stable. L2-3: Degenerative intervertebral disc space narrowing with diffuse disc bulge and reactive endplate changes. Moderate bilateral facet hypertrophy. No significant stenosis. Appearance is stable. L3-4: Trace retrolisthesis with left eccentric disc bulge and left greater than right facet hypertrophy. Resultant moderate left  lateral recess narrowing. Moderate to severe left L3 foraminal narrowing due to the adjacent metastatic lesion, unchanged. L4-5: 7 mm anterolisthesis. Diffuse disc bulge with left greater than right facet arthrosis. Resultant moderate canal with left greater than right lateral recess narrowing. Moderate left L4 foraminal stenosis. Appearance is stable. L5-S1: Severe right with moderate left facet hypertrophy. No significant disc pathology. No stenosis or impingement. IMPRESSION: 1. Stable post treatment size and appearance of partially necrotic left paravertebral mass at L3-4, measuring 19 x 25 mm on current exam (unchanged when measured at similar level on previous study). Mass effect on the adjacent left L3 nerve root is unchanged. Adjacent enhancing and atrophic left psoas muscle. 2. Left convex scoliosis with associated multilevel degenerative spondylolysis and facet arthrosis, unchanged. Electronically Signed   By: Jeannine Boga M.D.   On: 10/15/2018 14:57   Ct Abdomen Pelvis W Contrast  Result Date: 11/03/2018 CLINICAL DATA:  History of endometrial cancer with multiple recurrences. Restaging. EXAM: CT CHEST, ABDOMEN, AND PELVIS WITH CONTRAST TECHNIQUE: Multidetector CT imaging of the chest, abdomen and pelvis was performed following the standard protocol during bolus administration of intravenous contrast. CONTRAST:  143m OMNIPAQUE IOHEXOL 300 MG/ML  SOLN COMPARISON:  04/29/2018 CT abdomen/pelvis. 04/08/2016 CT chest, abdomen and pelvis. FINDINGS: CT CHEST FINDINGS Cardiovascular: Top-normal heart size. No significant pericardial effusion/thickening. Left anterior descending and right coronary atherosclerosis. Left internal jugular Port-A-Cath terminates in the lower third of the SVC. Atherosclerotic nonaneurysmal thoracic aorta. Normal caliber pulmonary arteries. No central pulmonary emboli. Mediastinum/Nodes: Stable multinodular goiter with subcentimeter hypodense bilateral thyroid nodules, some  of which are partially calcified on the right. Unremarkable esophagus. Surgical clips are again noted in the right axilla. No pathologically enlarged axillary, mediastinal or hilar lymph nodes. Lungs/Pleura: No pneumothorax. No pleural effusion. Irregular subsolid 2.1 x 1.5 cm superior segment right lower lobe pulmonary nodule is increased in size and density, previously 1.0 x 0.6 cm on 04/08/2016 chest CT and 0.8 x 0.5 cm on 12/01/2007 chest CT, and now crosses the major fissure into posterior right upper lobe (series 6/image 64). Stable mild sharply marginated patchy subpleural  reticulation in the anterior mid to upper right lung compatible with minimal radiation fibrosis. No acute consolidative airspace disease or additional significant pulmonary nodules. Musculoskeletal: No aggressive appearing focal osseous lesions. Mild thoracic spondylosis. CT ABDOMEN PELVIS FINDINGS Hepatobiliary: Normal liver with no liver mass. Cholelithiasis. No biliary ductal dilatation. Pancreas: Normal, with no mass or duct dilation. Spleen: Normal size. No mass. Adrenals/Urinary Tract: Normal adrenals. Normal kidneys with no hydronephrosis and no renal mass. Chronic mild diffuse bladder wall thickening, not definitely changed. Stomach/Bowel: Normal non-distended stomach. Normal caliber small bowel with no small bowel wall thickening. Appendix not discretely visualized. Moderate sigmoid diverticulosis, with no definite large bowel wall thickening or significant pericolonic fat stranding. Oral contrast transits to the rectum. Vascular/Lymphatic: Atherosclerotic nonaneurysmal abdominal aorta. Patent portal, splenic, hepatic and renal veins. No pathologically enlarged lymph nodes in the abdomen or pelvis. Reproductive: Status post hysterectomy, with no abnormal findings at the vaginal cuff. No adnexal mass. Other: No pneumoperitoneum, ascites or focal fluid collection. Musculoskeletal: No aggressive appearing focal osseous lesions. Left  L3-4 paraspinous 2.4 x 1.7 cm mass with irregular peripheral enhancement (series 2/image 70), previously 2.4 x 1.7 cm on 04/29/2018 CT using similar measurement technique, stable. Marked lumbar spondylosis. IMPRESSION: 1. Subsolid 2.1 cm irregular superior segment right lower lobe pulmonary nodule is increased in size and density since 2017 chest CT, and now crosses the major fissure into the right upper lobe, most compatible with enlarging primary bronchogenic adenocarcinoma given progressive growth since 2009 CT studies. Multidisciplinary thoracic oncology consultation suggested. 2. 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 No new or progressive metastatic disease. No evidence of peritoneal recurrence. 3. Chronic mild diffuse bladder wall thickening, unchanged, probably treatment related. 4. Aortic Atherosclerosis (ICD10-I70.0). Additional chronic findings as detailed. Electronically Signed   By: Ilona Sorrel M.D.   On: 11/03/2018 15:41    All questions were answered. The patient knows to call the clinic with any problems, questions or concerns. No barriers to learning was detected.  I spent 30 minutes counseling the patient face to face. The total time spent in the appointment was 40 minutes and more than 50% was on counseling and review of test results  Heath Lark, MD 11/04/2018 10:14 AM

## 2018-11-04 NOTE — Assessment & Plan Note (Signed)
She has significant elevated blood pressure that she attributed to whitecoat hypertension She will continue close follow-up with primary care doctor

## 2018-11-04 NOTE — Assessment & Plan Note (Signed)
I have reviewed multiple CT imaging with the patient Unfortunately, she has new growth in the right lung nodule, suspicious for primary lung cancer Given her history of endometrial cancer and breast cancer, metastatic disease cannot be excluded I recommend PET CT scan and referral to cardiothoracic surgery for further evaluation

## 2018-11-04 NOTE — Assessment & Plan Note (Signed)
Her most recent MRI is stable We will continue serial MRI imaging to follow

## 2018-11-04 NOTE — Telephone Encounter (Signed)
Called Dr. Leonarda Salon office and spoke to [place], [place] Patient Referral Coordinator.  Advised her that patient has a PET scan scheduled for 11/11/18.  [name] said she will call patient with an appointment.

## 2018-11-04 NOTE — Patient Instructions (Signed)

## 2018-11-11 ENCOUNTER — Other Ambulatory Visit: Payer: Self-pay

## 2018-11-11 ENCOUNTER — Ambulatory Visit (HOSPITAL_COMMUNITY)
Admission: RE | Admit: 2018-11-11 | Discharge: 2018-11-11 | Disposition: A | Discharge status: Home / Self Care | Payer: Medicare Other | Source: Ambulatory Visit | Attending: Hematology and Oncology | Admitting: Hematology and Oncology

## 2018-11-11 DIAGNOSIS — K8020 Calculus of gallbladder without cholecystitis without obstruction: Secondary | ICD-10-CM | POA: Diagnosis not present

## 2018-11-11 DIAGNOSIS — C3490 Malignant neoplasm of unspecified part of unspecified bronchus or lung: Secondary | ICD-10-CM

## 2018-11-11 DIAGNOSIS — J320 Chronic maxillary sinusitis: Secondary | ICD-10-CM | POA: Diagnosis not present

## 2018-11-11 DIAGNOSIS — I2510 Atherosclerotic heart disease of native coronary artery without angina pectoris: Secondary | ICD-10-CM | POA: Insufficient documentation

## 2018-11-11 DIAGNOSIS — K5730 Diverticulosis of large intestine without perforation or abscess without bleeding: Secondary | ICD-10-CM | POA: Insufficient documentation

## 2018-11-11 DIAGNOSIS — R911 Solitary pulmonary nodule: Secondary | ICD-10-CM | POA: Diagnosis not present

## 2018-11-11 DIAGNOSIS — I700 Atherosclerosis of aorta: Secondary | ICD-10-CM | POA: Diagnosis not present

## 2018-11-11 LAB — GLUCOSE, CAPILLARY: Glucose-Capillary: 101 mg/dL — ABNORMAL HIGH (ref 70–99)

## 2018-11-11 MED ORDER — FLUDEOXYGLUCOSE F - 18 (FDG) INJECTION
5.4000 | Freq: Once | INTRAVENOUS | Status: AC | PRN
  Administered 2018-11-11: 5.4 via INTRAVENOUS

## 2018-11-12 ENCOUNTER — Telehealth: Payer: Self-pay

## 2018-11-12 NOTE — Telephone Encounter (Signed)
Called and left a message asking her to call the office. 

## 2018-11-12 NOTE — Telephone Encounter (Signed)
Called her back and given below message. She verbalized understanding.

## 2018-11-12 NOTE — Telephone Encounter (Signed)
-----   Message from Heath Lark, MD sent at 11/12/2018 10:22 AM EDT ----- Regarding: FW: PET scan result Pls call her ----- Message ----- From: Heath Lark, MD Sent: 11/12/2018   9:10 AM EDT To: Jacqulyn Liner, RN Subject: PET scan result                                Pls tell her PET confirmed likely lung lesion Some activities on her back; could still be healing from previous treatment Please get her case presented at the next GYN TB Recommend her to keep appt as scehduled to see Dr. Roxan Hockey

## 2018-11-19 ENCOUNTER — Encounter: Payer: Medicare Other | Admitting: Thoracic Surgery (Cardiothoracic Vascular Surgery)

## 2018-12-01 ENCOUNTER — Other Ambulatory Visit: Payer: Self-pay | Admitting: *Deleted

## 2018-12-01 ENCOUNTER — Institutional Professional Consult (permissible substitution) (INDEPENDENT_AMBULATORY_CARE_PROVIDER_SITE_OTHER): Payer: Medicare Other | Admitting: Thoracic Surgery (Cardiothoracic Vascular Surgery)

## 2018-12-01 ENCOUNTER — Encounter: Payer: Self-pay | Admitting: Thoracic Surgery (Cardiothoracic Vascular Surgery)

## 2018-12-01 ENCOUNTER — Other Ambulatory Visit: Payer: Self-pay

## 2018-12-01 VITALS — BP 157/79 | HR 78 | Temp 97.8°F | Resp 20 | Ht 59.0 in | Wt 112.0 lb

## 2018-12-01 DIAGNOSIS — R911 Solitary pulmonary nodule: Secondary | ICD-10-CM | POA: Diagnosis not present

## 2018-12-01 NOTE — Progress Notes (Signed)
PCP is Kelly Baton, MD Referring Provider is Kelly Lark, MD  Chief Complaint  Patient presents with  . Lung Lesion    Surgical eval, Chest/ABD/Pelvis CT 11/03/18, PET Scan 11/11/18    HPI: [name] is sent for consultation regarding a right lung nodule  [name] is a 78 year old woman with a past medical history significant for metastatic endometrial cancer, breast cancer, hypertension, hyperlipidemia, PVCs, and multiple rounds of chemo and radiation.  She had a lumpectomy and axillary dissection for breast cancer back around the year 2000.  In 2009 she was diagnosed with endometrial cancer.  She underwent surgery and also chemo and radiation.  In 2012 she had a recurrence again treated with surgery, chemotherapy, and radiation.  In 2014 she underwent a major operation in [place].  In 2017 she was diagnosed with bone metastases and again treated with radiation and chemo.  Her endometrial cancer has been stable over time.  She is followed by [name].  She has had a vague groundglass opacity in the superior segment of her right lower lobe noted on CT scans dating back to at least 2017.  She recently had a CT of the chest abdomen and pelvis which showed an increase in size of the right lower lobe nodule and also likely crossing the fissure into the right upper lobe.  On PET CT this area was mildly hypermetabolic.  There also is an area of persistent hypermetabolism in the left L3-L4 paraspinous mass that has been present dating back to 2017.  She walks about 2 miles a day with her husband.  She denies any cough, hemoptysis, wheezing, shortness of breath, chest pain, pressure, or tightness.  She denies any change in appetite or weight loss.  She is a lifelong non-smoker. Zubrod Score: At the time of surgery this patient's most appropriate activity status/level should be described as: [x]     0    Normal activity, no symptoms []     1    Restricted in physical strenuous activity but  ambulatory, able to do out light work []     2    Ambulatory and capable of self care, unable to do work activities, up and about >50 % of waking hours                              []     3    Only limited self care, in bed greater than 50% of waking hours []     4    Completely disabled, no self care, confined to bed or chair []     5    Moribund   Past Medical History:  Diagnosis Date  . Endometrial cancer ([place]) 12/2007   s/p total abdominal hysterectomy at [facility] - ovaries were also removed   . Family history of breast cancer   . History of breast cancer    right breast -- treated with lumpectomy and radiation therarpy, postoperatively with tamoxifen   . History of radiation therapy 10/22/2016 to 10/28/2016  . Hypercholesterolemia   . Hyperlipidemia   . Hypertension   . Palpitations   . Personal history of radiation therapy 2006  . PVC's (premature ventricular contractions)   . Radiation 07/11/14-08/12/14   left lumbar paraspinal area 55 gray  . S/P radiation therapy 10/28/11-12/05/11   5040 cGy left pelvis  . S/P radiation therapy    Intracavitary brachytherapy of uterus    Past Surgical History:  Procedure Laterality Date  . APPENDECTOMY    . BREAST LUMPECTOMY Right 2000  . FOOT SURGERY     RIGHT  . TONSILLECTOMY    . TOTAL ABDOMINAL HYSTERECTOMY W/ BILATERAL SALPINGOOPHORECTOMY      Family History  Problem Relation Age of Onset  . Thrombosis Father        coronary thrombosis  . Hypertension Father   . Heart failure Mother   . Coronary artery disease Brother   . Breast cancer Maternal Aunt        dx in her 16s  . Stroke Maternal Uncle   . Stroke Maternal Grandfather     Social History Social History   Tobacco Use  . Smoking status: Former Smoker    Types: Cigarettes    Quit date: 05/13/1962    Years since quitting: 56.5  . Smokeless tobacco: Never Used  Substance Use Topics  . Alcohol use: Yes    Comment: ocass  . Drug use: No    Current Outpatient  Medications  Medication Sig Dispense Refill  . amLODipine (NORVASC) 5 MG tablet Take 5 mg by mouth daily.    Marland Kitchen atenolol (TENORMIN) 25 MG tablet Take 25 mg by mouth 2 (two) times daily.     . Biotin 5000 MCG TABS Take 5,000 mcg by mouth daily.    . Cyanocobalamin (VITAMIN B-12) 5000 MCG SUBL Place 1 tablet under the tongue daily.     Marland Kitchen gabapentin (NEURONTIN) 100 MG capsule     . ibuprofen (ADVIL,MOTRIN) 200 MG tablet Take 200-400 mg by mouth 2 (two) times daily as needed. Reported on 11/13/2015    . lidocaine-prilocaine (EMLA) cream Apply to [facility] cath 1-2 hours prior to access as directed 30 g 1  . LORazepam (ATIVAN) 0.5 MG tablet 1 tablet po 30 minutes prior to radiation or MRI 30 tablet 0  . Magnesium 250 MG TABS Take 250 mg by mouth every Monday, Wednesday, and Friday.     . Multiple Vitamin (MULTIVITAMIN WITH MINERALS) TABS tablet Take 1 tablet by mouth daily.    . potassium gluconate 595 MG TABS tablet Take 595 mg by mouth every Monday, Wednesday, and Friday.    . simvastatin (ZOCOR) 20 MG tablet Take 20 mg by mouth daily.     No current facility-administered medications for this visit.    Facility-Administered Medications Ordered in Other Visits  Medication Dose Route Frequency Provider Last Rate Last Dose  . heparin lock flush 100 unit/mL  500 Units Intracatheter Once Gorsuch, Ni, MD      . sodium chloride flush (NS) 0.9 % injection 10 mL  10 mL Intracatheter Once Kelly Lark, MD        Allergies  Allergen Reactions  . Ace Inhibitors Anaphylaxis  . Codeine Nausea Only  . Demerol Nausea Only  . Demerol  [Meperidine Hcl]   . Dilaudid [Hydromorphone Hcl] Other (See Comments)    "total loss of her mind"    Review of Systems  Constitutional: Negative for activity change and unexpected weight change.  HENT: Negative for trouble swallowing and voice change.   Respiratory: Negative for chest tightness, shortness of breath and wheezing.   Cardiovascular: Negative for chest pain and  leg swelling.  Gastrointestinal: Negative for abdominal distention and abdominal pain.  Genitourinary: Negative for difficulty urinating and dysuria.  Musculoskeletal: Negative for arthralgias.  Neurological: Negative for syncope and weakness.  Hematological: Negative for adenopathy. Does not bruise/bleed easily.  All other systems reviewed and are negative.  BP (!) 157/79   Pulse 78   Temp 97.8 F (36.6 C) (Skin)   Resp 20   Ht 4\' 11"  (1.499 m)   Wt 112 lb (50.8 kg)   SpO2 96% Comment: RA  BMI 22.62 kg/m  Physical Exam Vitals signs reviewed.  Constitutional:      General: She is not in acute distress.    Appearance: Normal appearance.  HENT:     Head: Normocephalic and atraumatic.     Comments: Wearing surgical mask Eyes:     General: No scleral icterus.    Extraocular Movements: Extraocular movements intact.     Conjunctiva/sclera: Conjunctivae normal.  Neck:     Musculoskeletal: Neck supple.  Cardiovascular:     Rate and Rhythm: Normal rate and regular rhythm.     Pulses: Normal pulses.     Heart sounds: Normal heart sounds. No murmur. No friction rub. No gallop.   Pulmonary:     Effort: Pulmonary effort is normal. No respiratory distress.     Breath sounds: Normal breath sounds. No wheezing or rales.  Abdominal:     General: There is no distension.     Palpations: Abdomen is soft.     Tenderness: There is no abdominal tenderness.  Musculoskeletal:        General: No swelling.  Lymphadenopathy:     Cervical: No cervical adenopathy.  Skin:    General: Skin is warm and dry.  Neurological:     General: No focal deficit present.     Mental Status: She is alert and oriented to person, place, and time.     Cranial Nerves: No cranial nerve deficit.     Motor: No weakness.    Diagnostic Tests: CT CHEST, ABDOMEN, AND PELVIS WITH CONTRAST  TECHNIQUE: Multidetector CT imaging of the chest, abdomen and pelvis was performed following the standard protocol during  bolus administration of intravenous contrast.  CONTRAST:  158mL OMNIPAQUE IOHEXOL 300 MG/ML  SOLN  COMPARISON:  04/29/2018 CT abdomen/pelvis. 04/08/2016 CT chest, abdomen and pelvis.  FINDINGS: CT CHEST FINDINGS  Cardiovascular: Top-normal heart size. No significant pericardial effusion/thickening. Left anterior descending and right coronary atherosclerosis. Left internal jugular Port-A-Cath terminates in the lower third of the SVC. Atherosclerotic nonaneurysmal thoracic aorta. Normal caliber pulmonary arteries. No central pulmonary emboli.  Mediastinum/Nodes: Stable multinodular goiter with subcentimeter hypodense bilateral thyroid nodules, some of which are partially calcified on the right. Unremarkable esophagus. Surgical clips are again noted in the right axilla. No pathologically enlarged axillary, mediastinal or hilar lymph nodes.  Lungs/Pleura: No pneumothorax. No pleural effusion. Irregular subsolid 2.1 x 1.5 cm superior segment right lower lobe pulmonary nodule is increased in size and density, previously 1.0 x 0.6 cm on 04/08/2016 chest CT and 0.8 x 0.5 cm on 12/01/2007 chest CT, and now crosses the major fissure into posterior right upper lobe (series 6/image 64). Stable mild sharply marginated patchy subpleural reticulation in the anterior mid to upper right lung compatible with minimal radiation fibrosis. No acute consolidative airspace disease or additional significant pulmonary nodules.  Musculoskeletal: No aggressive appearing focal osseous lesions. Mild thoracic spondylosis.  CT ABDOMEN PELVIS FINDINGS  Hepatobiliary: Normal liver with no liver mass. Cholelithiasis. No biliary ductal dilatation.  Pancreas: Normal, with no mass or duct dilation.  Spleen: Normal size. No mass.  Adrenals/Urinary Tract: Normal adrenals. Normal kidneys with no hydronephrosis and no renal mass. Chronic mild diffuse bladder wall thickening, not definitely  changed.  Stomach/Bowel: Normal non-distended stomach. Normal caliber small bowel with  no small bowel wall thickening. Appendix not discretely visualized. Moderate sigmoid diverticulosis, with no definite large bowel wall thickening or significant pericolonic fat stranding. Oral contrast transits to the rectum.  Vascular/Lymphatic: Atherosclerotic nonaneurysmal abdominal aorta. Patent portal, splenic, hepatic and renal veins. No pathologically enlarged lymph nodes in the abdomen or pelvis.  Reproductive: Status post hysterectomy, with no abnormal findings at the vaginal cuff. No adnexal mass.  Other: No pneumoperitoneum, ascites or focal fluid collection.  Musculoskeletal: No aggressive appearing focal osseous lesions. Left L3-4 paraspinous 2.4 x 1.7 cm mass with irregular peripheral enhancement (series 2/image 70), previously 2.4 x 1.7 cm on 04/29/2018 CT using similar measurement technique, stable. Marked lumbar spondylosis.  IMPRESSION: 1. Subsolid 2.1 cm irregular superior segment right lower lobe pulmonary nodule is increased in size and density since 2017 chest CT, and now crosses the major fissure into the right upper lobe, most compatible with enlarging primary bronchogenic adenocarcinoma given progressive growth since 2009 CT studies. Multidisciplinary thoracic oncology consultation suggested. 2. 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 No new or progressive metastatic disease. No evidence of peritoneal recurrence. 3. Chronic mild diffuse bladder wall thickening, unchanged, probably treatment related. 4. Aortic Atherosclerosis (ICD10-I70.0). Additional chronic findings as detailed.   Electronically Signed   By: Ilona Sorrel M.D.   On: 11/03/2018 15:41 NUCLEAR MEDICINE PET SKULL BASE TO THIGH  TECHNIQUE: 5.4 mCi F-18 FDG was injected intravenously. Full-ring PET imaging was performed from the skull base to thigh after the radiotracer. CT data  was obtained and used for attenuation correction and anatomic localization.  Fasting blood glucose: 101 mg/dl  COMPARISON:  11/03/2018 CT chest, abdomen and pelvis.  FINDINGS: Mediastinal blood pool activity: SUV max 2.4  Liver activity: SUV max NA  NECK: No hypermetabolic lymph nodes in the neck.  Incidental CT findings: Mucoperiosteal thickening throughout bilateral maxillary sinuses. Stable hypodense 1.2 cm right thyroid nodule without hypermetabolism. Left internal jugular Port-A-Cath terminates in the lower third of the SVC.  CHEST:  No hypermetabolic axillary, mediastinal or hilar lymph nodes.  Subsolid irregular superior segment right lower lobe 2.1 cm pulmonary nodule demonstrates low level metabolism with max SUV 2.0 (series 8/image 28) increased from 1.3 cm with max SUV 1.5 on 02/12/2016 PET-CT. No hypermetabolic pulmonary findings.  Incidental CT findings: Coronary atherosclerosis. Atherosclerotic nonaneurysmal thoracic aorta. Surgical clips noted in the right axilla. No acute consolidative airspace disease or additional significant pulmonary nodules.  ABDOMEN/PELVIS:  There is persistent hypermetabolism (max SUV 8.0) associated with left L3-4 paraspinous mass, which is poorly delineated on the noncontrast CT images, previous max SUV 8.9 on 02/12/2016 PET-CT, mildly decreased.  No abnormal hypermetabolic activity within the liver, pancreas, adrenal glands, or spleen. No hypermetabolic lymph nodes in the abdomen or pelvis. No hypermetabolic peritoneal implants. No ascites.  Incidental CT findings: Cholelithiasis. Atherosclerotic nonaneurysmal abdominal aorta. Moderate sigmoid diverticulosis. Hysterectomy.  SKELETON: No focal hypermetabolic activity to suggest skeletal metastasis.  Incidental CT findings: none  IMPRESSION: 1. Low level metabolism (max SUV 2.0) associated with the irregular subsolid superior segment right lower lobe 2.1  cm pulmonary nodule. As better depicted on the recent diagnostic chest CT study, this nodule crosses the major fissure into the right upper lobe and has increased in size and density on multiple imaging studies back to 2009. Findings are most suggestive of primary bronchogenic adenocarcinoma. 2. No hypermetabolic thoracic adenopathy. 3. Persistent hypermetabolism (max SUV 8.0) associated with the known left L3-4 paraspinous metastasis, mildly decreased in metabolism since 02/12/2016 PET-CT. 4. No additional  sites of hypermetabolic metastatic disease in the abdomen or pelvis. No metabolic evidence of peritoneal recurrence. No ascites. 5. Chronic findings include: Aortic Atherosclerosis (ICD10-I70.0). Coronary atherosclerosis. Chronic bilateral maxillary sinusitis. Cholelithiasis. Moderate sigmoid diverticulosis.   Electronically Signed   By: Ilona Sorrel M.D.   On: 11/11/2018 12:09 I personally reviewed the CT and PET/CT images and concur with the findings noted above  Impression: [name] is a 78 year old non-smoker with a past medical history significant for metastatic endometrial cancer, breast cancer, hypertension, hyperlipidemia, and PVCs.  She was treated for breast cancer back in 2000 with a lumpectomy and axillary radiation.  She was diagnosed with endometrial cancer in 2009.  She is had multiple treatments including surgeries, radiation, and chemotherapy.  She has had metastatic disease.  There is still an area in the left paraspinous region that is hypermetabolic on PET/CT.  This area is unchanged to slightly decreased from 2017.  Given its stability, I do not think that that rules out treating her lung lesion aggressively.  She has a right lower lobe lung nodule.  This is been present going back to 2017 and likely was there before that.  There has been an increase in size between 2017 and now.  That nodule is mildly hypermetabolic on PET/CT.  Findings are consistent with a  low-grade adenocarcinoma.  Infectious and inflammatory nodules are also in the differential diagnosis.  Interestingly, I resected a low-grade adenocarcinoma from her husband who is also a non-smoker.  We discussed potential treatments including surgery versus stereotactic radiation.  We discussed different approaches to biopsy.  We reviewed the relative advantages and disadvantages of each approach.  I think she is a good surgical candidate and recommended that we proceed directly to a right VATS for a right lower lobe superior segmentectomy.  We will likely have to remove a portion of the upper lobe en bloc to get a margin.  They are familiar with the surgery from her husband having similar operation previously.  I informed Mrs. and Mr. Alridge of the general nature of the operation including the incisions to be used, the need for general anesthesia, the use of a drainage tube postoperatively, the expected hospital stay, and the overall recovery.  I informed them of the indications, risks, benefits, and alternatives.  They understand the risks include, but not limited to death, MI, DVT, PE, bleeding, possible need for transfusion, infection, prolonged air leak, cardiac arrhythmias, as well as possibility of other unforeseeable complications.  She accepts the risks and wishes to proceed as soon as possible with surgical resection.  She is a lifelong non-smoker and has good exercise tolerance.  She does not need pulmonary function testing  Plan: Right VATS for right lower lobe superior segmentectomy on Monday, 12/07/2018  Melrose Nakayama, MD [facility] [phone]

## 2018-12-01 NOTE — H&P (View-Only) (Signed)
PCP is Shon Baton, MD Referring Provider is Heath Lark, MD  Chief Complaint  Patient presents with  . Lung Lesion    Surgical eval, Chest/ABD/Pelvis CT 11/03/18, PET Scan 11/11/18    HPI: [name] is sent for consultation regarding a right lung nodule  [name] is a 78 year old woman with a past medical history significant for metastatic endometrial cancer, breast cancer, hypertension, hyperlipidemia, PVCs, and multiple rounds of chemo and radiation.  She had a lumpectomy and axillary dissection for breast cancer back around the year 2000.  In 2009 she was diagnosed with endometrial cancer.  She underwent surgery and also chemo and radiation.  In 2012 she had a recurrence again treated with surgery, chemotherapy, and radiation.  In 2014 she underwent a major operation in [place].  In 2017 she was diagnosed with bone metastases and again treated with radiation and chemo.  Her endometrial cancer has been stable over time.  She is followed by Dr. Alvy Bimler.  She has had a vague groundglass opacity in the superior segment of her right lower lobe noted on CT scans dating back to at least 2017.  She recently had a CT of the chest abdomen and pelvis which showed an increase in size of the right lower lobe nodule and also likely crossing the fissure into the right upper lobe.  On PET CT this area was mildly hypermetabolic.  There also is an area of persistent hypermetabolism in the left L3-L4 paraspinous mass that has been present dating back to 2017.  She walks about 2 miles a day with her husband.  She denies any cough, hemoptysis, wheezing, shortness of breath, chest pain, pressure, or tightness.  She denies any change in appetite or weight loss.  She is a lifelong non-smoker. Zubrod Score: At the time of surgery this patient's most appropriate activity status/level should be described as: [x]     0    Normal activity, no symptoms []     1    Restricted in physical strenuous activity but  ambulatory, able to do out light work []     2    Ambulatory and capable of self care, unable to do work activities, up and about >50 % of waking hours                              []     3    Only limited self care, in bed greater than 50% of waking hours []     4    Completely disabled, no self care, confined to bed or chair []     5    Moribund   Past Medical History:  Diagnosis Date  . Endometrial cancer ([place]) 12/2007   s/p total abdominal hysterectomy at [facility] - ovaries were also removed   . Family history of breast cancer   . History of breast cancer    right breast -- treated with lumpectomy and radiation therarpy, postoperatively with tamoxifen   . History of radiation therapy 10/22/2016 to 10/28/2016  . Hypercholesterolemia   . Hyperlipidemia   . Hypertension   . Palpitations   . Personal history of radiation therapy 2006  . PVC's (premature ventricular contractions)   . Radiation 07/11/14-08/12/14   left lumbar paraspinal area 55 gray  . S/P radiation therapy 10/28/11-12/05/11   5040 cGy left pelvis  . S/P radiation therapy    Intracavitary brachytherapy of uterus    Past Surgical History:  Procedure Laterality Date  . APPENDECTOMY    . BREAST LUMPECTOMY Right 2000  . FOOT SURGERY     RIGHT  . TONSILLECTOMY    . TOTAL ABDOMINAL HYSTERECTOMY W/ BILATERAL SALPINGOOPHORECTOMY      Family History  Problem Relation Age of Onset  . Thrombosis Father        coronary thrombosis  . Hypertension Father   . Heart failure Mother   . Coronary artery disease Brother   . Breast cancer Maternal Aunt        dx in her 73s  . Stroke Maternal Uncle   . Stroke Maternal Grandfather     Social History Social History   Tobacco Use  . Smoking status: Former Smoker    Types: Cigarettes    Quit date: 05/13/1962    Years since quitting: 56.5  . Smokeless tobacco: Never Used  Substance Use Topics  . Alcohol use: Yes    Comment: ocass  . Drug use: No    Current Outpatient  Medications  Medication Sig Dispense Refill  . amLODipine (NORVASC) 5 MG tablet Take 5 mg by mouth daily.    Marland Kitchen atenolol (TENORMIN) 25 MG tablet Take 25 mg by mouth 2 (two) times daily.     . Biotin 5000 MCG TABS Take 5,000 mcg by mouth daily.    . Cyanocobalamin (VITAMIN B-12) 5000 MCG SUBL Place 1 tablet under the tongue daily.     Marland Kitchen gabapentin (NEURONTIN) 100 MG capsule     . ibuprofen (ADVIL,MOTRIN) 200 MG tablet Take 200-400 mg by mouth 2 (two) times daily as needed. Reported on 11/13/2015    . lidocaine-prilocaine (EMLA) cream Apply to [facility] cath 1-2 hours prior to access as directed 30 g 1  . LORazepam (ATIVAN) 0.5 MG tablet 1 tablet po 30 minutes prior to radiation or MRI 30 tablet 0  . Magnesium 250 MG TABS Take 250 mg by mouth every Monday, Wednesday, and Friday.     . Multiple Vitamin (MULTIVITAMIN WITH MINERALS) TABS tablet Take 1 tablet by mouth daily.    . potassium gluconate 595 MG TABS tablet Take 595 mg by mouth every Monday, Wednesday, and Friday.    . simvastatin (ZOCOR) 20 MG tablet Take 20 mg by mouth daily.     No current facility-administered medications for this visit.    Facility-Administered Medications Ordered in Other Visits  Medication Dose Route Frequency Provider Last Rate Last Dose  . heparin lock flush 100 unit/mL  500 Units Intracatheter Once Gorsuch, Ni, MD      . sodium chloride flush (NS) 0.9 % injection 10 mL  10 mL Intracatheter Once Heath Lark, MD        Allergies  Allergen Reactions  . Ace Inhibitors Anaphylaxis  . Codeine Nausea Only  . Demerol Nausea Only  . Demerol  [Meperidine Hcl]   . Dilaudid [Hydromorphone Hcl] Other (See Comments)    "total loss of her mind"    Review of Systems  Constitutional: Negative for activity change and unexpected weight change.  HENT: Negative for trouble swallowing and voice change.   Respiratory: Negative for chest tightness, shortness of breath and wheezing.   Cardiovascular: Negative for chest pain and  leg swelling.  Gastrointestinal: Negative for abdominal distention and abdominal pain.  Genitourinary: Negative for difficulty urinating and dysuria.  Musculoskeletal: Negative for arthralgias.  Neurological: Negative for syncope and weakness.  Hematological: Negative for adenopathy. Does not bruise/bleed easily.  All other systems reviewed and are negative.  BP (!) 157/79   Pulse 78   Temp 97.8 F (36.6 C) (Skin)   Resp 20   Ht 4\' 11"  (1.499 m)   Wt 112 lb (50.8 kg)   SpO2 96% Comment: RA  BMI 22.62 kg/m  Physical Exam Vitals signs reviewed.  Constitutional:      General: She is not in acute distress.    Appearance: Normal appearance.  HENT:     Head: Normocephalic and atraumatic.     Comments: Wearing surgical mask Eyes:     General: No scleral icterus.    Extraocular Movements: Extraocular movements intact.     Conjunctiva/sclera: Conjunctivae normal.  Neck:     Musculoskeletal: Neck supple.  Cardiovascular:     Rate and Rhythm: Normal rate and regular rhythm.     Pulses: Normal pulses.     Heart sounds: Normal heart sounds. No murmur. No friction rub. No gallop.   Pulmonary:     Effort: Pulmonary effort is normal. No respiratory distress.     Breath sounds: Normal breath sounds. No wheezing or rales.  Abdominal:     General: There is no distension.     Palpations: Abdomen is soft.     Tenderness: There is no abdominal tenderness.  Musculoskeletal:        General: No swelling.  Lymphadenopathy:     Cervical: No cervical adenopathy.  Skin:    General: Skin is warm and dry.  Neurological:     General: No focal deficit present.     Mental Status: She is alert and oriented to person, place, and time.     Cranial Nerves: No cranial nerve deficit.     Motor: No weakness.    Diagnostic Tests: CT CHEST, ABDOMEN, AND PELVIS WITH CONTRAST  TECHNIQUE: Multidetector CT imaging of the chest, abdomen and pelvis was performed following the standard protocol during  bolus administration of intravenous contrast.  CONTRAST:  140mL OMNIPAQUE IOHEXOL 300 MG/ML  SOLN  COMPARISON:  04/29/2018 CT abdomen/pelvis. 04/08/2016 CT chest, abdomen and pelvis.  FINDINGS: CT CHEST FINDINGS  Cardiovascular: Top-normal heart size. No significant pericardial effusion/thickening. Left anterior descending and right coronary atherosclerosis. Left internal jugular Port-A-Cath terminates in the lower third of the SVC. Atherosclerotic nonaneurysmal thoracic aorta. Normal caliber pulmonary arteries. No central pulmonary emboli.  Mediastinum/Nodes: Stable multinodular goiter with subcentimeter hypodense bilateral thyroid nodules, some of which are partially calcified on the right. Unremarkable esophagus. Surgical clips are again noted in the right axilla. No pathologically enlarged axillary, mediastinal or hilar lymph nodes.  Lungs/Pleura: No pneumothorax. No pleural effusion. Irregular subsolid 2.1 x 1.5 cm superior segment right lower lobe pulmonary nodule is increased in size and density, previously 1.0 x 0.6 cm on 04/08/2016 chest CT and 0.8 x 0.5 cm on 12/01/2007 chest CT, and now crosses the major fissure into posterior right upper lobe (series 6/image 64). Stable mild sharply marginated patchy subpleural reticulation in the anterior mid to upper right lung compatible with minimal radiation fibrosis. No acute consolidative airspace disease or additional significant pulmonary nodules.  Musculoskeletal: No aggressive appearing focal osseous lesions. Mild thoracic spondylosis.  CT ABDOMEN PELVIS FINDINGS  Hepatobiliary: Normal liver with no liver mass. Cholelithiasis. No biliary ductal dilatation.  Pancreas: Normal, with no mass or duct dilation.  Spleen: Normal size. No mass.  Adrenals/Urinary Tract: Normal adrenals. Normal kidneys with no hydronephrosis and no renal mass. Chronic mild diffuse bladder wall thickening, not definitely  changed.  Stomach/Bowel: Normal non-distended stomach. Normal caliber small bowel with  no small bowel wall thickening. Appendix not discretely visualized. Moderate sigmoid diverticulosis, with no definite large bowel wall thickening or significant pericolonic fat stranding. Oral contrast transits to the rectum.  Vascular/Lymphatic: Atherosclerotic nonaneurysmal abdominal aorta. Patent portal, splenic, hepatic and renal veins. No pathologically enlarged lymph nodes in the abdomen or pelvis.  Reproductive: Status post hysterectomy, with no abnormal findings at the vaginal cuff. No adnexal mass.  Other: No pneumoperitoneum, ascites or focal fluid collection.  Musculoskeletal: No aggressive appearing focal osseous lesions. Left L3-4 paraspinous 2.4 x 1.7 cm mass with irregular peripheral enhancement (series 2/image 70), previously 2.4 x 1.7 cm on 04/29/2018 CT using similar measurement technique, stable. Marked lumbar spondylosis.  IMPRESSION: 1. Subsolid 2.1 cm irregular superior segment right lower lobe pulmonary nodule is increased in size and density since 2017 chest CT, and now crosses the major fissure into the right upper lobe, most compatible with enlarging primary bronchogenic adenocarcinoma given progressive growth since 2009 CT studies. Multidisciplinary thoracic oncology consultation suggested. 2. 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 No new or progressive metastatic disease. No evidence of peritoneal recurrence. 3. Chronic mild diffuse bladder wall thickening, unchanged, probably treatment related. 4. Aortic Atherosclerosis (ICD10-I70.0). Additional chronic findings as detailed.   Electronically Signed   By: Ilona Sorrel M.D.   On: 11/03/2018 15:41 NUCLEAR MEDICINE PET SKULL BASE TO THIGH  TECHNIQUE: 5.4 mCi F-18 FDG was injected intravenously. Full-ring PET imaging was performed from the skull base to thigh after the radiotracer. CT data  was obtained and used for attenuation correction and anatomic localization.  Fasting blood glucose: 101 mg/dl  COMPARISON:  11/03/2018 CT chest, abdomen and pelvis.  FINDINGS: Mediastinal blood pool activity: SUV max 2.4  Liver activity: SUV max NA  NECK: No hypermetabolic lymph nodes in the neck.  Incidental CT findings: Mucoperiosteal thickening throughout bilateral maxillary sinuses. Stable hypodense 1.2 cm right thyroid nodule without hypermetabolism. Left internal jugular Port-A-Cath terminates in the lower third of the SVC.  CHEST:  No hypermetabolic axillary, mediastinal or hilar lymph nodes.  Subsolid irregular superior segment right lower lobe 2.1 cm pulmonary nodule demonstrates low level metabolism with max SUV 2.0 (series 8/image 28) increased from 1.3 cm with max SUV 1.5 on 02/12/2016 PET-CT. No hypermetabolic pulmonary findings.  Incidental CT findings: Coronary atherosclerosis. Atherosclerotic nonaneurysmal thoracic aorta. Surgical clips noted in the right axilla. No acute consolidative airspace disease or additional significant pulmonary nodules.  ABDOMEN/PELVIS:  There is persistent hypermetabolism (max SUV 8.0) associated with left L3-4 paraspinous mass, which is poorly delineated on the noncontrast CT images, previous max SUV 8.9 on 02/12/2016 PET-CT, mildly decreased.  No abnormal hypermetabolic activity within the liver, pancreas, adrenal glands, or spleen. No hypermetabolic lymph nodes in the abdomen or pelvis. No hypermetabolic peritoneal implants. No ascites.  Incidental CT findings: Cholelithiasis. Atherosclerotic nonaneurysmal abdominal aorta. Moderate sigmoid diverticulosis. Hysterectomy.  SKELETON: No focal hypermetabolic activity to suggest skeletal metastasis.  Incidental CT findings: none  IMPRESSION: 1. Low level metabolism (max SUV 2.0) associated with the irregular subsolid superior segment right lower lobe 2.1  cm pulmonary nodule. As better depicted on the recent diagnostic chest CT study, this nodule crosses the major fissure into the right upper lobe and has increased in size and density on multiple imaging studies back to 2009. Findings are most suggestive of primary bronchogenic adenocarcinoma. 2. No hypermetabolic thoracic adenopathy. 3. Persistent hypermetabolism (max SUV 8.0) associated with the known left L3-4 paraspinous metastasis, mildly decreased in metabolism since 02/12/2016 PET-CT. 4. No additional  sites of hypermetabolic metastatic disease in the abdomen or pelvis. No metabolic evidence of peritoneal recurrence. No ascites. 5. Chronic findings include: Aortic Atherosclerosis (ICD10-I70.0). Coronary atherosclerosis. Chronic bilateral maxillary sinusitis. Cholelithiasis. Moderate sigmoid diverticulosis.   Electronically Signed   By: Ilona Sorrel M.D.   On: 11/11/2018 12:09 I personally reviewed the CT and PET/CT images and concur with the findings noted above  Impression: [name] is a 79 year old non-smoker with a past medical history significant for metastatic endometrial cancer, breast cancer, hypertension, hyperlipidemia, and PVCs.  She was treated for breast cancer back in 2000 with a lumpectomy and axillary radiation.  She was diagnosed with endometrial cancer in 2009.  She is had multiple treatments including surgeries, radiation, and chemotherapy.  She has had metastatic disease.  There is still an area in the left paraspinous region that is hypermetabolic on PET/CT.  This area is unchanged to slightly decreased from 2017.  Given its stability, I do not think that that rules out treating her lung lesion aggressively.  She has a right lower lobe lung nodule.  This is been present going back to 2017 and likely was there before that.  There has been an increase in size between 2017 and now.  That nodule is mildly hypermetabolic on PET/CT.  Findings are consistent with a  low-grade adenocarcinoma.  Infectious and inflammatory nodules are also in the differential diagnosis.  Interestingly, I resected a low-grade adenocarcinoma from her husband who is also a non-smoker.  We discussed potential treatments including surgery versus stereotactic radiation.  We discussed different approaches to biopsy.  We reviewed the relative advantages and disadvantages of each approach.  I think she is a good surgical candidate and recommended that we proceed directly to a right VATS for a right lower lobe superior segmentectomy.  We will likely have to remove a portion of the upper lobe en bloc to get a margin.  They are familiar with the surgery from her husband having similar operation previously.  I informed Mrs. and [name] of the general nature of the operation including the incisions to be used, the need for general anesthesia, the use of a drainage tube postoperatively, the expected hospital stay, and the overall recovery.  I informed them of the indications, risks, benefits, and alternatives.  They understand the risks include, but not limited to death, MI, DVT, PE, bleeding, possible need for transfusion, infection, prolonged air leak, cardiac arrhythmias, as well as possibility of other unforeseeable complications.  She accepts the risks and wishes to proceed as soon as possible with surgical resection.  She is a lifelong non-smoker and has good exercise tolerance.  She does not need pulmonary function testing  Plan: Right VATS for right lower lobe superior segmentectomy on Monday, 12/07/2018  Melrose Nakayama, MD [facility] [phone]

## 2018-12-02 NOTE — Progress Notes (Signed)
[id number] [place], [place] [address] [place] [address] Phone: [phone] Fax: [phone]      Your procedure is scheduled on July 27  Report to [facility] Main Entrance "A" at 0530 A.M., and check in at the Admitting office.  Call this number if you have problems the morning of surgery:  [phone]  Call [phone] if you have any questions prior to your surgery date Monday-Friday 8am-4pm    Remember:  Do not eat or drink after midnight the night before your surgery    Take these medicines the morning of surgery with A SIP OF WATER  amLODipine (NORVASC)  atenolol (TENORMIN) simvastatin (ZOCOR)  7 days prior to surgery STOP taking any Aspirin (unless otherwise instructed by your surgeon), Aleve, Naproxen, Ibuprofen, Motrin, Advil, Goody's, BC's, all herbal medications, fish oil, and all vitamins.    The Morning of Surgery  Do not wear jewelry, make-up or nail polish.  Do not wear lotions, powders, or perfumes/colognes, or deodorant  Do not shave 48 hours prior to surgery.    Do not bring valuables to the hospital.  [facility] is not responsible for any belongings or valuables.  If you are a smoker, DO NOT Smoke 24 hours prior to surgery IF you wear a CPAP at night please bring your mask, tubing, and machine the morning of surgery   Remember that you must have someone to transport you home after your surgery, and remain with you for 24 hours if you are discharged the same day.   Contacts, glasses, hearing aids, dentures or bridgework may not be worn into surgery.    Leave your suitcase in the car.  After surgery it may be brought to your room.  For patients admitted to the hospital, discharge time will be determined by your treatment team.  Patients discharged the day of surgery will not be allowed to drive home.    Special instructions:   [facility]- Preparing For  Surgery  Before surgery, you can play an important role. Because skin is not sterile, your skin needs to be as free of germs as possible. You can reduce the number of germs on your skin by washing with CHG (chlorahexidine gluconate) Soap before surgery.  CHG is an antiseptic cleaner which kills germs and bonds with the skin to continue killing germs even after washing.    Oral Hygiene is also important to reduce your risk of infection.  Remember - BRUSH YOUR TEETH THE MORNING OF SURGERY WITH YOUR REGULAR TOOTHPASTE  Please do not use if you have an allergy to CHG or antibacterial soaps. If your skin becomes reddened/irritated stop using the CHG.  Do not shave (including legs and underarms) for at least 48 hours prior to first CHG shower. It is OK to shave your face.  Please follow these instructions carefully.   1. Shower the NIGHT BEFORE SURGERY and the MORNING OF SURGERY with CHG Soap.   2. If you chose to wash your hair, wash your hair first as usual with your normal shampoo.  3. After you shampoo, rinse your hair and body thoroughly to remove the shampoo.  4. Use CHG as you would any other liquid soap. You can apply CHG directly to the skin and wash gently with a scrungie or a clean washcloth.   5. Apply the CHG Soap to your body ONLY FROM THE NECK DOWN.  Do not use on  open wounds or open sores. Avoid contact with your eyes, ears, mouth and genitals (private parts). Wash Face and genitals (private parts)  with your normal soap.   6. Wash thoroughly, paying special attention to the area where your surgery will be performed.  7. Thoroughly rinse your body with warm water from the neck down.  8. DO NOT shower/wash with your normal soap after using and rinsing off the CHG Soap.  9. Pat yourself dry with a CLEAN TOWEL.  10. Wear CLEAN PAJAMAS to bed the night before surgery, wear comfortable clothes the morning of surgery  11. Place CLEAN SHEETS on your bed the night of your first  shower and DO NOT SLEEP WITH PETS.    Day of Surgery:  Do not apply any deodorants/lotions. Please shower the morning of surgery with the CHG soap  Please wear clean clothes to the hospital/surgery center.   Remember to brush your teeth WITH YOUR REGULAR TOOTHPASTE.   Please read over the following fact sheets that you were given.

## 2018-12-03 ENCOUNTER — Encounter (HOSPITAL_COMMUNITY)
Admission: RE | Admit: 2018-12-03 | Discharge: 2018-12-03 | Disposition: A | Discharge status: Home / Self Care | Payer: Medicare Other | Source: Ambulatory Visit | Attending: Thoracic Surgery (Cardiothoracic Vascular Surgery) | Admitting: Thoracic Surgery (Cardiothoracic Vascular Surgery)

## 2018-12-03 ENCOUNTER — Ambulatory Visit (HOSPITAL_COMMUNITY)
Admission: RE | Admit: 2018-12-03 | Discharge: 2018-12-03 | Disposition: A | Discharge status: Home / Self Care | Payer: Medicare Other | Source: Ambulatory Visit | Attending: Thoracic Surgery (Cardiothoracic Vascular Surgery) | Admitting: Thoracic Surgery (Cardiothoracic Vascular Surgery)

## 2018-12-03 ENCOUNTER — Encounter (HOSPITAL_COMMUNITY): Payer: Self-pay

## 2018-12-03 ENCOUNTER — Other Ambulatory Visit: Payer: Self-pay

## 2018-12-03 ENCOUNTER — Other Ambulatory Visit (HOSPITAL_COMMUNITY)
Admission: RE | Admit: 2018-12-03 | Discharge: 2018-12-03 | Disposition: A | Discharge status: Home / Self Care | Payer: Medicare Other | Source: Ambulatory Visit | Attending: Thoracic Surgery (Cardiothoracic Vascular Surgery) | Admitting: Thoracic Surgery (Cardiothoracic Vascular Surgery)

## 2018-12-03 DIAGNOSIS — Z1159 Encounter for screening for other viral diseases: Secondary | ICD-10-CM | POA: Insufficient documentation

## 2018-12-03 DIAGNOSIS — Z01818 Encounter for other preprocedural examination: Secondary | ICD-10-CM | POA: Insufficient documentation

## 2018-12-03 DIAGNOSIS — R911 Solitary pulmonary nodule: Secondary | ICD-10-CM | POA: Diagnosis not present

## 2018-12-03 LAB — BLOOD GAS, ARTERIAL
Acid-Base Excess: 1.1 mmol/L (ref 0.0–2.0)
Bicarbonate: 24.6 mmol/L (ref 20.0–28.0)
Drawn by: 421801
FIO2: 21
O2 Saturation: 98.7 %
Patient temperature: 98.6
pCO2 arterial: 35.4 mmHg (ref 32.0–48.0)
pH, Arterial: 7.457 — ABNORMAL HIGH (ref 7.350–7.450)
pO2, Arterial: 118 mmHg — ABNORMAL HIGH (ref 83.0–108.0)

## 2018-12-03 LAB — TYPE AND SCREEN
ABO/RH(D): A POS
Antibody Screen: NEGATIVE

## 2018-12-03 LAB — CBC
HCT: 40.3 % (ref 36.0–46.0)
Hemoglobin: 13.4 g/dL (ref 12.0–15.0)
MCH: 32.3 pg (ref 26.0–34.0)
MCHC: 33.3 g/dL (ref 30.0–36.0)
MCV: 97.1 fL (ref 80.0–100.0)
Platelets: 206 10*3/uL (ref 150–400)
RBC: 4.15 MIL/uL (ref 3.87–5.11)
RDW: 12.8 % (ref 11.5–15.5)
WBC: 7.1 10*3/uL (ref 4.0–10.5)
nRBC: 0 % (ref 0.0–0.2)

## 2018-12-03 LAB — COMPREHENSIVE METABOLIC PANEL
ALT: 21 U/L (ref 0–44)
AST: 31 U/L (ref 15–41)
Albumin: 3.9 g/dL (ref 3.5–5.0)
Alkaline Phosphatase: 47 U/L (ref 38–126)
Anion gap: 13 (ref 5–15)
BUN: 24 mg/dL — ABNORMAL HIGH (ref 8–23)
CO2: 21 mmol/L — ABNORMAL LOW (ref 22–32)
Calcium: 9.6 mg/dL (ref 8.9–10.3)
Chloride: 104 mmol/L (ref 98–111)
Creatinine, Ser: 0.84 mg/dL (ref 0.44–1.00)
GFR calc Af Amer: >60 mL/min (ref >60)
GFR calc non Af Amer: >60 mL/min (ref >60)
Glucose, Bld: 105 mg/dL — ABNORMAL HIGH (ref 70–99)
Potassium: 4.3 mmol/L (ref 3.5–5.1)
Sodium: 138 mmol/L (ref 135–145)
Total Bilirubin: 1.1 mg/dL (ref 0.3–1.2)
Total Protein: 7.1 g/dL (ref 6.5–8.1)

## 2018-12-03 LAB — URINALYSIS, ROUTINE W REFLEX MICROSCOPIC
Bilirubin Urine: NEGATIVE
Glucose, UA: NEGATIVE mg/dL
Hgb urine dipstick: NEGATIVE
Ketones, ur: NEGATIVE mg/dL
Nitrite: NEGATIVE
Protein, ur: NEGATIVE mg/dL
Specific Gravity, Urine: 1.01 (ref 1.005–1.030)
pH: 5.5 (ref 5.0–8.0)

## 2018-12-03 LAB — URINALYSIS, MICROSCOPIC (REFLEX)

## 2018-12-03 LAB — SARS CORONAVIRUS 2 (TAT 6-24 HRS): SARS Coronavirus 2: NEGATIVE

## 2018-12-03 LAB — ABO/RH: ABO/RH(D): A POS

## 2018-12-03 LAB — PROTIME-INR
INR: 1 (ref 0.8–1.2)
Prothrombin Time: 13.3 seconds (ref 11.4–15.2)

## 2018-12-03 LAB — SURGICAL PCR SCREEN
MRSA, PCR: NEGATIVE
Staphylococcus aureus: POSITIVE — AB

## 2018-12-03 LAB — APTT: aPTT: 27 seconds (ref 24–36)

## 2018-12-03 NOTE — Progress Notes (Signed)
PCP - Shon Baton Cardiologist -  denies  Chest x-ray - 12/03/18 EKG - 12/03/18 Stress Test - denies ECHO - denies Cardiac Cath - denies   Anesthesia review: No  Patient denies shortness of breath, fever, cough and chest pain at PAT appointment   Patient verbalized understanding of instructions that were given to them at the PAT appointment. Patient was also instructed that they will need to review over the PAT instructions again at home before surgery.

## 2018-12-07 ENCOUNTER — Other Ambulatory Visit: Payer: Self-pay

## 2018-12-07 ENCOUNTER — Inpatient Hospital Stay (HOSPITAL_COMMUNITY): Payer: Medicare Other

## 2018-12-07 ENCOUNTER — Encounter (HOSPITAL_COMMUNITY)
Admission: RE | Disposition: A | Discharge status: Home / Self Care | Payer: Self-pay | Source: Home / Self Care | Attending: Thoracic Surgery (Cardiothoracic Vascular Surgery)

## 2018-12-07 ENCOUNTER — Inpatient Hospital Stay (HOSPITAL_COMMUNITY): Payer: Medicare Other | Admitting: Certified Registered Nurse Anesthetist

## 2018-12-07 ENCOUNTER — Inpatient Hospital Stay (HOSPITAL_COMMUNITY)
Admission: RE | Admit: 2018-12-07 | Discharge: 2018-12-11 | DRG: 164 | Disposition: A | Discharge status: Home / Self Care | Payer: Medicare Other | Attending: Thoracic Surgery (Cardiothoracic Vascular Surgery) | Admitting: Thoracic Surgery (Cardiothoracic Vascular Surgery)

## 2018-12-07 ENCOUNTER — Encounter (HOSPITAL_COMMUNITY): Payer: Self-pay | Admitting: *Deleted

## 2018-12-07 DIAGNOSIS — Z8249 Family history of ischemic heart disease and other diseases of the circulatory system: Secondary | ICD-10-CM | POA: Diagnosis not present

## 2018-12-07 DIAGNOSIS — C50919 Malignant neoplasm of unspecified site of unspecified female breast: Secondary | ICD-10-CM | POA: Diagnosis present

## 2018-12-07 DIAGNOSIS — C7951 Secondary malignant neoplasm of bone: Secondary | ICD-10-CM | POA: Diagnosis present

## 2018-12-07 DIAGNOSIS — Z923 Personal history of irradiation: Secondary | ICD-10-CM | POA: Diagnosis not present

## 2018-12-07 DIAGNOSIS — J939 Pneumothorax, unspecified: Secondary | ICD-10-CM

## 2018-12-07 DIAGNOSIS — I10 Essential (primary) hypertension: Secondary | ICD-10-CM | POA: Diagnosis present

## 2018-12-07 DIAGNOSIS — C3431 Malignant neoplasm of lower lobe, right bronchus or lung: Principal | ICD-10-CM | POA: Diagnosis present

## 2018-12-07 DIAGNOSIS — R911 Solitary pulmonary nodule: Secondary | ICD-10-CM | POA: Diagnosis not present

## 2018-12-07 DIAGNOSIS — D62 Acute posthemorrhagic anemia: Secondary | ICD-10-CM | POA: Diagnosis not present

## 2018-12-07 DIAGNOSIS — I700 Atherosclerosis of aorta: Secondary | ICD-10-CM | POA: Diagnosis present

## 2018-12-07 DIAGNOSIS — Z01811 Encounter for preprocedural respiratory examination: Secondary | ICD-10-CM

## 2018-12-07 DIAGNOSIS — J439 Emphysema, unspecified: Secondary | ICD-10-CM | POA: Diagnosis not present

## 2018-12-07 DIAGNOSIS — E7800 Pure hypercholesterolemia, unspecified: Secondary | ICD-10-CM | POA: Diagnosis present

## 2018-12-07 DIAGNOSIS — Z87891 Personal history of nicotine dependence: Secondary | ICD-10-CM | POA: Diagnosis not present

## 2018-12-07 DIAGNOSIS — I2510 Atherosclerotic heart disease of native coronary artery without angina pectoris: Secondary | ICD-10-CM | POA: Diagnosis present

## 2018-12-07 DIAGNOSIS — J9811 Atelectasis: Secondary | ICD-10-CM | POA: Diagnosis not present

## 2018-12-07 DIAGNOSIS — Z4682 Encounter for fitting and adjustment of non-vascular catheter: Secondary | ICD-10-CM

## 2018-12-07 DIAGNOSIS — E785 Hyperlipidemia, unspecified: Secondary | ICD-10-CM | POA: Diagnosis present

## 2018-12-07 DIAGNOSIS — I493 Ventricular premature depolarization: Secondary | ICD-10-CM | POA: Diagnosis present

## 2018-12-07 DIAGNOSIS — Z823 Family history of stroke: Secondary | ICD-10-CM | POA: Diagnosis not present

## 2018-12-07 DIAGNOSIS — Z9221 Personal history of antineoplastic chemotherapy: Secondary | ICD-10-CM | POA: Diagnosis not present

## 2018-12-07 DIAGNOSIS — C7982 Secondary malignant neoplasm of genital organs: Secondary | ICD-10-CM | POA: Diagnosis present

## 2018-12-07 DIAGNOSIS — Z803 Family history of malignant neoplasm of breast: Secondary | ICD-10-CM | POA: Diagnosis not present

## 2018-12-07 DIAGNOSIS — C771 Secondary and unspecified malignant neoplasm of intrathoracic lymph nodes: Secondary | ICD-10-CM | POA: Diagnosis present

## 2018-12-07 DIAGNOSIS — E042 Nontoxic multinodular goiter: Secondary | ICD-10-CM | POA: Diagnosis present

## 2018-12-07 DIAGNOSIS — C3411 Malignant neoplasm of upper lobe, right bronchus or lung: Secondary | ICD-10-CM | POA: Diagnosis not present

## 2018-12-07 DIAGNOSIS — M47814 Spondylosis without myelopathy or radiculopathy, thoracic region: Secondary | ICD-10-CM | POA: Diagnosis present

## 2018-12-07 DIAGNOSIS — Z885 Allergy status to narcotic agent status: Secondary | ICD-10-CM | POA: Diagnosis not present

## 2018-12-07 DIAGNOSIS — Z888 Allergy status to other drugs, medicaments and biological substances status: Secondary | ICD-10-CM

## 2018-12-07 DIAGNOSIS — Z01818 Encounter for other preprocedural examination: Secondary | ICD-10-CM | POA: Diagnosis not present

## 2018-12-07 HISTORY — PX: VIDEO ASSISTED THORACOSCOPY: SHX5073

## 2018-12-07 HISTORY — PX: SEGMENTECOMY: SHX5076

## 2018-12-07 LAB — GLUCOSE, CAPILLARY: Glucose-Capillary: 121 mg/dL — ABNORMAL HIGH (ref 70–99)

## 2018-12-07 SURGERY — VIDEO ASSISTED THORACOSCOPY
Anesthesia: General | Site: Chest | Laterality: Right

## 2018-12-07 MED ORDER — CEFAZOLIN SODIUM-DEXTROSE 2-4 GM/100ML-% IV SOLN
2.0000 g | Freq: Three times a day (TID) | INTRAVENOUS | Status: AC
  Administered 2018-12-07 (×2): 2 g via INTRAVENOUS
  Filled 2018-12-07 (×2): qty 100

## 2018-12-07 MED ORDER — DIPHENHYDRAMINE HCL 50 MG/ML IJ SOLN: 12.5000 mg | Freq: Four times a day (QID) | INTRAMUSCULAR | Status: DC | PRN

## 2018-12-07 MED ORDER — MUPIROCIN 2 % EX OINT
TOPICAL_OINTMENT | CUTANEOUS | Status: AC
  Administered 2018-12-07: 1
  Filled 2018-12-07: qty 22

## 2018-12-07 MED ORDER — SIMVASTATIN 20 MG PO TABS
20.0000 mg | ORAL_TABLET | Freq: Every evening | ORAL | Status: DC
  Administered 2018-12-07 – 2018-12-10 (×4): 20 mg via ORAL
  Filled 2018-12-07 (×4): qty 1

## 2018-12-07 MED ORDER — HEMOSTATIC AGENTS (NO CHARGE) OPTIME
TOPICAL | Status: DC | PRN
  Administered 2018-12-07: 1 via TOPICAL

## 2018-12-07 MED ORDER — FENTANYL CITRATE (PF) 250 MCG/5ML IJ SOLN
INTRAMUSCULAR | Status: DC | PRN
  Administered 2018-12-07 (×3): 50 ug via INTRAVENOUS
  Administered 2018-12-07: 100 ug via INTRAVENOUS

## 2018-12-07 MED ORDER — SODIUM CHLORIDE 0.9 % IV SOLN
INTRAVENOUS | Status: DC | PRN
  Administered 2018-12-07: 20 ug/min via INTRAVENOUS

## 2018-12-07 MED ORDER — LIDOCAINE 2% (20 MG/ML) 5 ML SYRINGE
INTRAMUSCULAR | Status: DC | PRN
  Administered 2018-12-07: 40 mg via INTRAVENOUS

## 2018-12-07 MED ORDER — ONDANSETRON HCL 4 MG/2ML IJ SOLN
INTRAMUSCULAR | Status: DC | PRN
  Administered 2018-12-07: 4 mg via INTRAVENOUS

## 2018-12-07 MED ORDER — TRAMADOL HCL 50 MG PO TABS
50.0000 mg | ORAL_TABLET | Freq: Two times a day (BID) | ORAL | Status: DC | PRN
  Administered 2018-12-08 (×2): 50 mg via ORAL
  Filled 2018-12-07 (×2): qty 1

## 2018-12-07 MED ORDER — MAGNESIUM 250 MG PO TABS: 250.0000 mg | ORAL_TABLET | Freq: Every day | ORAL | Status: DC

## 2018-12-07 MED ORDER — SENNOSIDES-DOCUSATE SODIUM 8.6-50 MG PO TABS
1.0000 | ORAL_TABLET | Freq: Every day | ORAL | Status: DC
  Administered 2018-12-07 – 2018-12-10 (×4): 1 via ORAL
  Filled 2018-12-07 (×4): qty 1

## 2018-12-07 MED ORDER — ONDANSETRON HCL 4 MG/2ML IJ SOLN: 4.0000 mg | Freq: Four times a day (QID) | INTRAMUSCULAR | Status: DC | PRN

## 2018-12-07 MED ORDER — CEFAZOLIN SODIUM-DEXTROSE 2-4 GM/100ML-% IV SOLN
2.0000 g | INTRAVENOUS | Status: AC
  Administered 2018-12-07: 2 g via INTRAVENOUS
  Filled 2018-12-07: qty 100

## 2018-12-07 MED ORDER — BUPIVACAINE HCL (PF) 0.5 % IJ SOLN
INTRAMUSCULAR | Status: AC
  Filled 2018-12-07: qty 30

## 2018-12-07 MED ORDER — 0.9 % SODIUM CHLORIDE (POUR BTL) OPTIME
TOPICAL | Status: DC | PRN
  Administered 2018-12-07: 2000 mL

## 2018-12-07 MED ORDER — NALOXONE HCL 0.4 MG/ML IJ SOLN: 0.4000 mg | INTRAMUSCULAR | Status: DC | PRN

## 2018-12-07 MED ORDER — SUGAMMADEX SODIUM 200 MG/2ML IV SOLN
INTRAVENOUS | Status: DC | PRN
  Administered 2018-12-07: 100 mg via INTRAVENOUS

## 2018-12-07 MED ORDER — DEXAMETHASONE SODIUM PHOSPHATE 10 MG/ML IJ SOLN
INTRAMUSCULAR | Status: AC
  Filled 2018-12-07: qty 2

## 2018-12-07 MED ORDER — MIDAZOLAM HCL 2 MG/2ML IJ SOLN
INTRAMUSCULAR | Status: AC
  Filled 2018-12-07: qty 2

## 2018-12-07 MED ORDER — PANTOPRAZOLE SODIUM 40 MG PO TBEC
40.0000 mg | DELAYED_RELEASE_TABLET | Freq: Every day | ORAL | Status: DC
  Administered 2018-12-08 – 2018-12-11 (×4): 40 mg via ORAL
  Filled 2018-12-07 (×3): qty 1

## 2018-12-07 MED ORDER — FENTANYL 40 MCG/ML IV SOLN
INTRAVENOUS | Status: AC
  Administered 2018-12-07: 10 ug via INTRAVENOUS
  Administered 2018-12-07: 80 ug via INTRAVENOUS
  Administered 2018-12-07: 1000 ug via INTRAVENOUS
  Administered 2018-12-07 – 2018-12-08 (×3): 0 ug via INTRAVENOUS
  Administered 2018-12-08: 30 ug via INTRAVENOUS
  Administered 2018-12-08: 10 ug via INTRAVENOUS
  Administered 2018-12-08: 0 ug via INTRAVENOUS
  Administered 2018-12-09: 40 ug via INTRAVENOUS
  Administered 2018-12-09: 0 ug via INTRAVENOUS
  Administered 2018-12-09: 10 ug via INTRAVENOUS
  Filled 2018-12-07: qty 25

## 2018-12-07 MED ORDER — ROCURONIUM BROMIDE 10 MG/ML (PF) SYRINGE
PREFILLED_SYRINGE | INTRAVENOUS | Status: AC
  Filled 2018-12-07: qty 10

## 2018-12-07 MED ORDER — ACETAMINOPHEN 160 MG/5ML PO SOLN: 1000.0000 mg | Freq: Four times a day (QID) | ORAL | Status: DC

## 2018-12-07 MED ORDER — PROPOFOL 10 MG/ML IV BOLUS
INTRAVENOUS | Status: DC | PRN
  Administered 2018-12-07 (×2): 40 mg via INTRAVENOUS
  Administered 2018-12-07: 50 mg via INTRAVENOUS
  Administered 2018-12-07: 20 mg via INTRAVENOUS

## 2018-12-07 MED ORDER — POTASSIUM CHLORIDE IN NACL 20-0.9 MEQ/L-% IV SOLN
INTRAVENOUS | Status: DC
  Administered 2018-12-07: 16:00:00 via INTRAVENOUS
  Filled 2018-12-07 (×3): qty 1000

## 2018-12-07 MED ORDER — EPHEDRINE 5 MG/ML INJ
INTRAVENOUS | Status: AC
  Filled 2018-12-07: qty 10

## 2018-12-07 MED ORDER — OXYCODONE HCL 5 MG PO TABS
5.0000 mg | ORAL_TABLET | ORAL | Status: DC | PRN
  Administered 2018-12-08 – 2018-12-10 (×2): 5 mg via ORAL
  Filled 2018-12-07 (×4): qty 1

## 2018-12-07 MED ORDER — FENTANYL CITRATE (PF) 250 MCG/5ML IJ SOLN
INTRAMUSCULAR | Status: AC
  Filled 2018-12-07: qty 5

## 2018-12-07 MED ORDER — LIDOCAINE 2% (20 MG/ML) 5 ML SYRINGE
INTRAMUSCULAR | Status: AC
  Filled 2018-12-07: qty 5

## 2018-12-07 MED ORDER — ONDANSETRON HCL 4 MG/2ML IJ SOLN
INTRAMUSCULAR | Status: AC
  Filled 2018-12-07: qty 2

## 2018-12-07 MED ORDER — ACETAMINOPHEN 500 MG PO TABS
1000.0000 mg | ORAL_TABLET | Freq: Four times a day (QID) | ORAL | Status: DC
  Administered 2018-12-07 – 2018-12-11 (×13): 1000 mg via ORAL
  Filled 2018-12-07 (×13): qty 2

## 2018-12-07 MED ORDER — PHENYLEPHRINE 40 MCG/ML (10ML) SYRINGE FOR IV PUSH (FOR BLOOD PRESSURE SUPPORT)
PREFILLED_SYRINGE | INTRAVENOUS | Status: AC
  Filled 2018-12-07: qty 10

## 2018-12-07 MED ORDER — SODIUM CHLORIDE 0.9 % IV SOLN
INTRAVENOUS | Status: DC | PRN
  Administered 2018-12-07: 07:00:00 20 mL

## 2018-12-07 MED ORDER — AMLODIPINE BESYLATE 5 MG PO TABS
5.0000 mg | ORAL_TABLET | Freq: Every day | ORAL | Status: DC
  Administered 2018-12-08 – 2018-12-11 (×4): 5 mg via ORAL
  Filled 2018-12-07 (×4): qty 1

## 2018-12-07 MED ORDER — MIDAZOLAM HCL 5 MG/5ML IJ SOLN
INTRAMUSCULAR | Status: DC | PRN
  Administered 2018-12-07: 0.5 mg via INTRAVENOUS

## 2018-12-07 MED ORDER — EPHEDRINE SULFATE-NACL 50-0.9 MG/10ML-% IV SOSY
PREFILLED_SYRINGE | INTRAVENOUS | Status: DC | PRN
  Administered 2018-12-07: 5 mg via INTRAVENOUS

## 2018-12-07 MED ORDER — SODIUM CHLORIDE (PF) 0.9 % IJ SOLN: INTRAMUSCULAR | Status: DC | PRN

## 2018-12-07 MED ORDER — SUCCINYLCHOLINE CHLORIDE 200 MG/10ML IV SOSY
PREFILLED_SYRINGE | INTRAVENOUS | Status: AC
  Filled 2018-12-07: qty 10

## 2018-12-07 MED ORDER — LACTATED RINGERS IV SOLN
INTRAVENOUS | Status: DC | PRN
  Administered 2018-12-07: 07:00:00 via INTRAVENOUS

## 2018-12-07 MED ORDER — SODIUM CHLORIDE 0.9% FLUSH: 9.0000 mL | INTRAVENOUS | Status: DC | PRN

## 2018-12-07 MED ORDER — BISACODYL 5 MG PO TBEC
10.0000 mg | DELAYED_RELEASE_TABLET | Freq: Every day | ORAL | Status: DC
  Administered 2018-12-09 – 2018-12-11 (×3): 10 mg via ORAL
  Filled 2018-12-07 (×3): qty 2

## 2018-12-07 MED ORDER — MUPIROCIN 2 % EX OINT
TOPICAL_OINTMENT | Freq: Two times a day (BID) | CUTANEOUS | Status: AC
  Administered 2018-12-07: via NASAL
  Filled 2018-12-07: qty 22

## 2018-12-07 MED ORDER — DIPHENHYDRAMINE HCL 12.5 MG/5ML PO ELIX
12.5000 mg | ORAL_SOLUTION | Freq: Four times a day (QID) | ORAL | Status: DC | PRN
  Filled 2018-12-07: qty 5

## 2018-12-07 MED ORDER — ATENOLOL 25 MG PO TABS
12.5000 mg | ORAL_TABLET | Freq: Two times a day (BID) | ORAL | Status: DC
  Administered 2018-12-08 – 2018-12-11 (×7): 12.5 mg via ORAL
  Filled 2018-12-07 (×9): qty 0.5

## 2018-12-07 MED ORDER — ROCURONIUM BROMIDE 50 MG/5ML IV SOSY
PREFILLED_SYRINGE | INTRAVENOUS | Status: DC | PRN
  Administered 2018-12-07 (×3): 10 mg via INTRAVENOUS
  Administered 2018-12-07: 50 mg via INTRAVENOUS

## 2018-12-07 MED ORDER — PROPOFOL 10 MG/ML IV BOLUS
INTRAVENOUS | Status: AC
  Filled 2018-12-07: qty 20

## 2018-12-07 MED ORDER — DEXAMETHASONE SODIUM PHOSPHATE 10 MG/ML IJ SOLN
INTRAMUSCULAR | Status: DC | PRN
  Administered 2018-12-07: 10 mg via INTRAVENOUS

## 2018-12-07 MED ORDER — INSULIN ASPART 100 UNIT/ML ~~LOC~~ SOLN
0.0000 [IU] | Freq: Four times a day (QID) | SUBCUTANEOUS | Status: DC
  Administered 2018-12-07: 2 [IU] via SUBCUTANEOUS

## 2018-12-07 SURGICAL SUPPLY — 96 items
ADH SKN CLS APL DERMABOND .7 (GAUZE/BANDAGES/DRESSINGS) ×1
APL SWBSTK 6 STRL LF DISP (MISCELLANEOUS) ×1
APPLICATOR COTTON TIP 6 STRL (MISCELLANEOUS) IMPLANT
APPLICATOR COTTON TIP 6IN STRL (MISCELLANEOUS) ×3
APPLIER CLIP ROT 10 11.4 M/L (STAPLE) ×3
APR CLP MED LRG 11.4X10 (STAPLE) ×1
BAG SPEC RTRVL LRG 6X4 10 (ENDOMECHANICALS) ×1
CANISTER SUCT 3000ML PPV (MISCELLANEOUS) ×3 IMPLANT
CATH THORACIC 28FR RT ANG (CATHETERS) IMPLANT
CATH THORACIC 36FR (CATHETERS) IMPLANT
CATH THORACIC 36FR RT ANG (CATHETERS) IMPLANT
CLIP APPLIE ROT 10 11.4 M/L (STAPLE) IMPLANT
CLIP VESOCCLUDE MED 6/CT (CLIP) IMPLANT
CONN ST 1/4X3/8  BEN (MISCELLANEOUS) ×2
CONN ST 1/4X3/8 BEN (MISCELLANEOUS) IMPLANT
CONN Y 3/8X3/8X3/8  BEN (MISCELLANEOUS)
CONN Y 3/8X3/8X3/8 BEN (MISCELLANEOUS) ×1 IMPLANT
CONT SPEC 4OZ CLIKSEAL STRL BL (MISCELLANEOUS) ×16 IMPLANT
COVER SURGICAL LIGHT HANDLE (MISCELLANEOUS) ×3 IMPLANT
COVER WAND RF STERILE (DRAPES) ×1 IMPLANT
DERMABOND ADVANCED (GAUZE/BANDAGES/DRESSINGS) ×2
DERMABOND ADVANCED .7 DNX12 (GAUZE/BANDAGES/DRESSINGS) IMPLANT
DRAIN CHANNEL 28F RND 3/8 FF (WOUND CARE) ×2 IMPLANT
DRAIN CHANNEL 32F RND 10.7 FF (WOUND CARE) IMPLANT
DRAPE LAPAROSCOPIC ABDOMINAL (DRAPES) ×3 IMPLANT
DRAPE SLUSH/WARMER DISC (DRAPES) ×3 IMPLANT
ELECT BLADE 6.5 EXT (BLADE) ×2 IMPLANT
ELECT REM PT RETURN 9FT ADLT (ELECTROSURGICAL) ×3
ELECTRODE REM PT RTRN 9FT ADLT (ELECTROSURGICAL) ×1 IMPLANT
FELT TEFLON 1X6 (MISCELLANEOUS) ×2 IMPLANT
GAUZE SPONGE 4X4 12PLY STRL (GAUZE/BANDAGES/DRESSINGS) ×3 IMPLANT
GAUZE SPONGE 4X4 12PLY STRL LF (GAUZE/BANDAGES/DRESSINGS) ×2 IMPLANT
GLOVE BIO SURGEON STRL SZ 6.5 (GLOVE) ×3 IMPLANT
GLOVE BIO SURGEONS STRL SZ 6.5 (GLOVE) ×3
GLOVE BIOGEL PI IND STRL 7.5 (GLOVE) IMPLANT
GLOVE BIOGEL PI INDICATOR 7.5 (GLOVE) ×4
GLOVE SURG SIGNA 7.5 PF LTX (GLOVE) ×4 IMPLANT
GOWN STRL REUS W/ TWL LRG LVL3 (GOWN DISPOSABLE) ×2 IMPLANT
GOWN STRL REUS W/ TWL XL LVL3 (GOWN DISPOSABLE) ×1 IMPLANT
GOWN STRL REUS W/TWL LRG LVL3 (GOWN DISPOSABLE) ×12
GOWN STRL REUS W/TWL XL LVL3 (GOWN DISPOSABLE)
HEMOSTAT SURGICEL 2X14 (HEMOSTASIS) ×2 IMPLANT
IV CATH 22GX1 FEP (IV SOLUTION) IMPLANT
KIT BASIN OR (CUSTOM PROCEDURE TRAY) ×3 IMPLANT
KIT SUCTION CATH 14FR (SUCTIONS) ×1 IMPLANT
KIT TURNOVER KIT B (KITS) ×3 IMPLANT
NDL SPNL 18GX3.5 QUINCKE PK (NEEDLE) ×1 IMPLANT
NEEDLE SPNL 18GX3.5 QUINCKE PK (NEEDLE) ×3 IMPLANT
NS IRRIG 1000ML POUR BTL (IV SOLUTION) ×4 IMPLANT
PACK CHEST (CUSTOM PROCEDURE TRAY) ×3 IMPLANT
PAD ARMBOARD 7.5X6 YLW CONV (MISCELLANEOUS) ×6 IMPLANT
POUCH ENDO CATCH II 15MM (MISCELLANEOUS) IMPLANT
POUCH SPECIMEN RETRIEVAL 10MM (ENDOMECHANICALS) ×2 IMPLANT
RELOAD STAPLE 35X2.5 WHT THIN (STAPLE) IMPLANT
RELOAD STAPLE 60 3.8 GOLD REG (STAPLE) IMPLANT
RELOAD STAPLE 60 4.1 GRN THCK (STAPLE) IMPLANT
RELOAD STAPLER GOLD 60MM (STAPLE) ×10 IMPLANT
RELOAD STAPLER GREEN 60MM (STAPLE) ×1 IMPLANT
SEALANT PROGEL (MISCELLANEOUS) IMPLANT
SEALANT SURG COSEAL 4ML (VASCULAR PRODUCTS) IMPLANT
SEALANT SURG COSEAL 8ML (VASCULAR PRODUCTS) IMPLANT
SOLUTION ANTI FOG 6CC (MISCELLANEOUS) ×3 IMPLANT
SPECIMEN JAR MEDIUM (MISCELLANEOUS) ×3 IMPLANT
SPONGE INTESTINAL PEANUT (DISPOSABLE) ×6 IMPLANT
SPONGE TONSIL TAPE 1 RFD (DISPOSABLE) ×3 IMPLANT
STAPLE ECHEON FLEX 60 POW ENDO (STAPLE) ×2 IMPLANT
STAPLE RELOAD 2.5MM WHITE (STAPLE) ×12 IMPLANT
STAPLER RELOAD GOLD 60MM (STAPLE) ×30
STAPLER RELOAD GREEN 60MM (STAPLE) ×3
STAPLER VASCULAR ECHELON 35 (CUTTER) ×2 IMPLANT
SUT PROLENE 4 0 RB 1 (SUTURE) ×6
SUT PROLENE 4-0 RB1 .5 CRCL 36 (SUTURE) IMPLANT
SUT SILK  1 MH (SUTURE) ×2
SUT SILK 1 MH (SUTURE) ×2 IMPLANT
SUT SILK 2 0SH CR/8 30 (SUTURE) ×2 IMPLANT
SUT SILK 3 0SH CR/8 30 (SUTURE) IMPLANT
SUT VIC AB 1 CTX 36 (SUTURE)
SUT VIC AB 1 CTX36XBRD ANBCTR (SUTURE) IMPLANT
SUT VIC AB 2-0 CTX 36 (SUTURE) ×4 IMPLANT
SUT VIC AB 2-0 UR6 27 (SUTURE) IMPLANT
SUT VIC AB 3-0 MH 27 (SUTURE) IMPLANT
SUT VIC AB 3-0 X1 27 (SUTURE) ×3 IMPLANT
SUT VICRYL 2 TP 1 (SUTURE) IMPLANT
SYR 20CC LL (SYRINGE) ×2 IMPLANT
SYR 20ML LL LF (SYRINGE) IMPLANT
SYSTEM SAHARA CHEST DRAIN ATS (WOUND CARE) ×3 IMPLANT
TAPE CLOTH 4X10 WHT NS (GAUZE/BANDAGES/DRESSINGS) ×3 IMPLANT
TAPE CLOTH SURG 4X10 WHT LF (GAUZE/BANDAGES/DRESSINGS) ×2 IMPLANT
TIP APPLICATOR SPRAY EXTEND 16 (VASCULAR PRODUCTS) IMPLANT
TOWEL GREEN STERILE (TOWEL DISPOSABLE) ×5 IMPLANT
TOWEL GREEN STERILE FF (TOWEL DISPOSABLE) ×3 IMPLANT
TRAY FOLEY MTR SLVR 16FR STAT (SET/KITS/TRAYS/PACK) ×3 IMPLANT
TROCAR XCEL BLADELESS 5X75MML (TROCAR) ×3 IMPLANT
TROCAR XCEL NON-BLD 5MMX100MML (ENDOMECHANICALS) IMPLANT
WATER STERILE IRR 1000ML POUR (IV SOLUTION) ×4 IMPLANT
YANKAUER SUCT BULB TIP NO VENT (SUCTIONS) ×2 IMPLANT

## 2018-12-07 NOTE — Anesthesia Procedure Notes (Signed)
Procedure Name: Intubation Date/Time: 12/07/2018 7:50 AM Performed by: Shirlyn Goltz, CRNA Pre-anesthesia Checklist: Patient identified, Emergency Drugs available, Suction available and Patient being monitored Patient Re-evaluated:Patient Re-evaluated prior to induction Oxygen Delivery Method: Circle system utilized Preoxygenation: Pre-oxygenation with 100% oxygen Induction Type: IV induction Ventilation: Mask ventilation without difficulty Laryngoscope Size: Mac and 3 Grade View: Grade I Endobronchial tube: Left, Double lumen EBT, EBT position confirmed by auscultation and EBT position confirmed by fiberoptic bronchoscope and 35 Fr Number of attempts: 1 Placement Confirmation: ETT inserted through vocal cords under direct vision,  positive ETCO2 and breath sounds checked- equal and bilateral Tube secured with: Tape Dental Injury: Teeth and Oropharynx as per pre-operative assessment

## 2018-12-07 NOTE — Transfer of Care (Signed)
Immediate Anesthesia Transfer of Care Note  Patient: [name]  Procedure(s) Performed: VIDEO ASSISTED THORACOSCOPY (Right Chest) RIGHT LOWER LOBE SUPERIOR SEGMENTECTOMY (Right Chest)  Patient Location: PACU  Anesthesia Type:General  Level of Consciousness: awake, alert , oriented and patient cooperative  Airway & Oxygen Therapy: Patient Spontanous Breathing and Patient connected to nasal cannula oxygen  Post-op Assessment: Report given to RN and Post -op Vital signs reviewed and stable  Post vital signs: Reviewed and stable  Last Vitals:  Vitals Value Taken Time  BP 92/50 12/07/18 1115  Temp    Pulse 58 12/07/18 1118  Resp 18 12/07/18 1118  SpO2 95 % 12/07/18 1118  Vitals shown include unvalidated device data.  Last Pain:  Vitals:   12/07/18 0637  PainSc: 0-No pain         Complications: No apparent anesthesia complications

## 2018-12-07 NOTE — Anesthesia Postprocedure Evaluation (Signed)
Anesthesia Post Note  Patient: [name]  Procedure(s) Performed: VIDEO ASSISTED THORACOSCOPY (Right Chest) RIGHT LOWER LOBE SUPERIOR SEGMENTECTOMY (Right Chest)     Patient location during evaluation: PACU Anesthesia Type: General Level of consciousness: awake and alert Pain management: pain level controlled Vital Signs Assessment: post-procedure vital signs reviewed and stable Respiratory status: spontaneous breathing, nonlabored ventilation, respiratory function stable and patient connected to nasal cannula oxygen Cardiovascular status: blood pressure returned to baseline and stable Postop Assessment: no apparent nausea or vomiting Anesthetic complications: no    Last Vitals:  Vitals:   12/07/18 1516 12/07/18 1548  BP: 124/64   Pulse: 75   Resp: 20 19  Temp: 36.5 C   SpO2: 100% 100%    Last Pain:  Vitals:   12/07/18 1548  TempSrc:   PainSc: 6                  Hennessy Bartel

## 2018-12-07 NOTE — Discharge Instructions (Signed)
Thoracoscopy, Care After This sheet gives you information about how to care for yourself after your procedure. Your health care provider may also give you more specific instructions. If you have problems or questions, contact your health care provider. What can I expect after the procedure? After the procedure, it is common to have pain and soreness in the surgical area. Follow these instructions at home: Incision care   Follow instructions from your health care provider about how to take care of your incision. Make sure you: ? Wash your hands with soap and water before you change your bandage (dressing). If soap and water are not available, use hand sanitizer. ? Change your dressing as told by your health care provider. ? Leave stitches (sutures), skin glue, or adhesive strips in place. These skin closures may need to stay in place for 2 weeks or longer. If adhesive strip edges start to loosen and curl up, you may trim the loose edges. Do not remove adhesive strips completely unless your health care provider tells you to do that.  Check your incision areas every day for signs of infection. Check for: ? Redness, swelling, or pain. ? Fluid or blood. ? Warmth. ? Pus or a bad smell.  Do not take baths, swim, or use a hot tub until your health care provider approves. You may take showers. Medicines  Take over-the-counter and prescription medicines only as told by your health care provider.  If you were prescribed an antibiotic medicine, take it as told by your health care provider. Do not stop taking the antibiotic even if you start to feel better.  Do not drive or use heavy machinery while taking prescription pain medicine.  If you are taking prescription pain medicine, take actions to prevent or treat constipation. Your health care provider may recommend that you: ? Drink enough fluid to keep your urine pale yellow. ? Eat foods that are high in fiber, such as fresh fruits and vegetables,  whole grains, and beans. ? Limit foods that are high in fat and processed sugars, such as fried and sweet foods. ? Take an over-the-counter or prescription medicine for constipation. Managing pain, stiffness, and swelling   If directed, put ice on the affected area: ? Put ice in a plastic bag. ? Place a towel between your skin and the bag. ? Leave the ice on for 20 minutes, 2-3 times a day. Preventing lung infection  To prevent pneumonia and to keep your lungs healthy: ? Try to cough often. If it hurts to cough, hold a pillow against your chest as you cough. ? Take deep breaths or do breathing exercises as instructed by your health care provider. ? If you were given an incentive spirometer, use it as directed by your health care provider. General instructions  Do not lift anything that is heavier than 10 lb (4.5 kg), or the limit that you are told, until your health care provider says that it is safe.  Do not use any products that contain nicotine or tobacco, such as cigarettes and e-cigarettes. These can delay healing after surgery. If you need help quitting, ask your health care provider.  Avoid driving until your health care provider approves.  If you have a chest drainage tube, care for it as instructed by your health care provider. Do not travel by airplane after the chest drainage tube is removed until your health care provider approves.  Keep all follow-up visits as told by your health care provider. This is important. Contact  a health care provider if:  You have a fever.  Pain medicines do not ease your pain.  You have redness, swelling, or increasing pain in your incision area.  You develop a cough that does not go away, or you are coughing up mucus that is yellow or green. Get help right away if:  You have fluid, blood, or pus coming from your incision.  There is a bad smell coming from your incision or dressing.  You develop a rash.  You cough up blood.  You  develop light-headedness, or you feel faint.  You have difficulty breathing.  You develop chest pain.  Your heartbeat feels irregular or very fast. These symptoms may represent a serious problem that is an emergency. Do not wait to see if the symptoms will go away. Get medical help right away. Call your local emergency services (911 in the [place] Do not drive yourself to the hospital. Summary  Follow instructions from your health care provider about how to take care of your incision.  Do not drive or use heavy machinery while taking prescription pain medicine.  Leave stitches (sutures), skin glue, or adhesive strips in place.  Check your incision areas every day for signs of infection. This information is not intended to replace advice given to you by your health care provider. Make sure you discuss any questions you have with your health care provider. Document Released: 11/16/2004 Document Revised: 04/11/2017 Document Reviewed: 04/08/2017 Elsevier Patient Education  2020 [employer].

## 2018-12-07 NOTE — Anesthesia Procedure Notes (Signed)
Arterial Line Insertion Start/End7/27/2020 7:10 AM Performed by: Shirlyn Goltz, CRNA  Preanesthetic checklist: patient identified, IV checked, site marked, risks and benefits discussed, surgical consent, monitors and equipment checked, pre-op evaluation, timeout performed and anesthesia consent Lidocaine 1% used for infiltration and patient sedated Left, radial was placed Catheter size: 20 G Hand hygiene performed  and maximum sterile barriers used   Attempts: 1 Procedure performed without using ultrasound guided technique. Ultrasound Notes:anatomy identified and needle tip was noted to be adjacent to the nerve/plexus identified Following insertion, dressing applied and Biopatch. Post procedure assessment: normal and unchanged  Patient tolerated the procedure well with no immediate complications.

## 2018-12-07 NOTE — Anesthesia Preprocedure Evaluation (Signed)
Anesthesia Evaluation  Patient identified by MRN, date of birth, ID band Patient awake    Reviewed: Allergy & Precautions, NPO status , Patient's Chart, lab work & pertinent test results  History of Anesthesia Complications Negative for: history of anesthetic complications  Airway Mallampati: II  TM Distance: >3 FB Neck ROM: Full    Dental  (+) Teeth Intact,    Pulmonary neg shortness of breath, neg recent URI, former smoker,   RIGHT LUNG NODULE   breath sounds clear to auscultation       Cardiovascular hypertension, Pt. on medications (-) angina(-) Past MI and (-) CHF (-) dysrhythmias  Rhythm:Regular     Neuro/Psych neg Seizures  Neuromuscular disease negative psych ROS   GI/Hepatic negative GI ROS, Neg liver ROS,   Endo/Other  negative endocrine ROS  Renal/GU negative Renal ROS     Musculoskeletal negative musculoskeletal ROS (+)   Abdominal   Peds  Hematology negative hematology ROS (+)   Anesthesia Other Findings   Reproductive/Obstetrics                             Anesthesia Physical Anesthesia Plan  ASA: II  Anesthesia Plan: General   Post-op Pain Management:    Induction: Intravenous  PONV Risk Score and Plan: 3 and Dexamethasone and Ondansetron  Airway Management Planned: Double Lumen EBT  Additional Equipment: Arterial line  Intra-op Plan:   Post-operative Plan: Extubation in OR  Informed Consent: I have reviewed the patients History and Physical, chart, labs and discussed the procedure including the risks, benefits and alternatives for the proposed anesthesia with the patient or authorized representative who has indicated his/her understanding and acceptance.     Dental advisory given  Plan Discussed with: CRNA and Surgeon  Anesthesia Plan Comments:         Anesthesia Quick Evaluation

## 2018-12-07 NOTE — Interval H&P Note (Signed)
History and Physical Interval Note:  12/07/2018 7:24 AM  [name]  has presented today for surgery, with the diagnosis of RIGHT LUNG NODULE.  The various methods of treatment have been discussed with the patient and family. After consideration of risks, benefits and other options for treatment, the patient has consented to  Procedure(s): VIDEO ASSISTED THORACOSCOPY (Right) RIGHT LOWER LOBE SUPERIOR SEGMENTECTOMY (Right) as a surgical intervention.  The patient's history has been reviewed, patient examined, no change in status, stable for surgery.  I have reviewed the patient's chart and labs.  Questions were answered to the patient's satisfaction.     Melrose Nakayama

## 2018-12-07 NOTE — Op Note (Signed)
NAME: Kelly, Sandoval MEDICAL RECORD [id number] ACCOUNT [id number] DATE OF BIRTH:1941/02/03 FACILITY: [facility] LOCATION: [facility] PHYSICIAN:STEVEN Chaya Jan, MD  OPERATIVE REPORT  DATE OF PROCEDURE:  12/07/2018  PREOPERATIVE DIAGNOSIS:  Right lung nodule.  POSTOPERATIVE DIAGNOSIS:  Non-small cell carcinoma- suspected primary lung carcinoma, clinical stage T2N01b.  PROCEDURE:   Right video-assisted thoracoscopy, Right lower lobe superior segmentectomy with en bloc wedge resection of right upper lobe, Lymph node dissection, Intercostal nerve block levels 3-9.  SURGEON:  Modesto Charon, MD  ASSISTANT:  Lars Pinks, PA-C  ANESTHESIA:  General.  FINDINGS:  Mass palpable in posterior aspect of superior segment of right lower lobe, likely involvement across fissure into the upper lobe.  Frozen section revealed non-small cell carcinoma. upper and lower lobe margins clear.  CLINICAL NOTE:  [name] is a 78 year old woman with a history of endometrial cancer and breast cancer.  She has had multiple treatments for her endometrial cancer which has been metastatic.  She recently had a CT of the chest which showed an increase  in the size of a ground-glass opacity in the right lower lobe. The nodule was now likely crossing the fissure into the right upper lobe.  This area was mildly hypermetabolic on PET CT.  She was offered the option of surgical resection for definitive  diagnosis and possible definitive treatment.  The indications, risks, benefits, and alternatives were discussed in detail with the patient.  She understood and accepted the risks and agreed to proceed.  OPERATIVE NOTE:  [name] was brought to the preoperative holding area on 12/07/2018.  Dr. Ermalene Postin of the anesthesia service placed an arterial blood pressure monitoring line and a central venous catheter.  She was taken to the operating room,  anesthetized and intubated with a double lumen endotracheal tube.   Sequential compression devices were placed on the calves for DVT prophylaxis.  Intravenous antibiotics were administered.  A Foley catheter was placed.  She was placed in a left lateral  decubitus position and the right chest was prepped and draped in the usual sterile fashion.  Single lung ventilation of the left lung was initiated and was tolerated well throughout the procedure.  A timeout was performed.  A solution containing 20 mL of liposomal bupivacaine, 30 mL of 0.5% bupivacaine and 50 mL of saline was prepared.  This solution was used for anesthetizing the incision sites prior to making the incisions as well as for the  intercostal nerve blocks.  An injection was made at the 7th interspace and a small port type incision was made.  A 5 mm port was inserted.  The thoracoscope was advanced into the chest.  There was good isolation of the right lung.  There was no visible  abnormality of the visceral or parietal pleura and no pleural effusion.  After injecting the area with the bupivacaine solution, an incision was made in the 5th interspace anterolaterally.  This was a 5 cm incision.  No rib spreading was performed during the procedure.  The mass was  palpable in the posterior aspect of the superior segment.  There was an area of the fissure, which was not visible that appeared to be involved with the tumor extending into the right upper lobe.  The inferior ligament was divided.  All lymph nodes that  were encountered during the dissection were removed and sent as separate specimens for permanent pathology.  Two level 9 nodes were removed.  The pleural reflection was divided at the hilum posteriorly and level 7  nodes were removed as well.  Anteriorly,  a level 10 node was removed.  The fissure was incomplete, but relatively clearly demarcated.  Electrocautery and blunt dissection with Kitner dissector were used to dissect down to the basilar segmental pulmonary artery.  The major fissure was completed   anteriorly with a firing of an Echelon stapler using a 60 mm gold cartridge.  Dissection then continued in the fissure atop the pulmonary artery just lateral to the confluence of the major and minor fissures.  As dissection was done on the superior  segmental pulmonary arterial branch, bleeding was encountered.  Direct pressure was held and then a 4-0 Prolene suture was placed with good hemostasis.  A level 12 node at the bifurcation of the superior segmental artery was removed.  The superior  segmental artery then was encircled and divided with the endoscopic vascular stapler.  It was clear that an en bloc wedge resection of the upper lobe would be needed to achieve a margin, so this was begun with firings of the Echelon stapler.  Dissection  was carried posteriorly and the superior segmental pulmonary vein branch was identified and divided with the vascular stapler.  The superior segmental bronchus then was encircled.  The Echelon stapler with a green cartridge was placed across the bronchus  and closed.  A test inflation showed good aeration of the remainder of the lower lobe as well as the upper and middle lobes.  The stapler was fired, transecting the superior segmental bronchus.  The segmentectomy then was completed, dividing the  parenchyma with the Echelon stapler using gold cartridges.  The upper lobe wedge resection was completed in the same fashion.  The specimen was placed into an endoscopic retrieval bag, removed and sent for frozen section of both the masses as well as the  upper and lower lobe margins, which were marked.  While awaiting those results, intercostal nerve blocks were performed from the third to the ninth interspaces on the right side.  A needle was inserted from a posterior approach and 10 mL of the  bupivacaine solution was injected into each interspace.  The chest was copiously irrigated with warm saline.  A test inflation to 30 cm of water revealed no air leakage, but there was  a small residual portion of the superior segment, which was still  present, which was not aerated.  This area was resected with the Echelon stapler and sent as a final lower lobe margin.  The frozen section returned showing a non-small cell carcinoma.  It was unclear if this was a lung primary, but the margins were  clear.  Level 4R and 2R nodes were removed from the right paratracheal region.  A 28-French Blake drain was placed through the original port incision and secured to skin with a #1 silk suture.  Dual-lung ventilation was resumed.  The working incision was  closed in 3 layers.  Dermabond was applied.  The chest tube was placed to suction.  The patient was extubated in the operating room and taken to the [facility] in good condition.  All sponge, needle and instrument counts were correct at  the end of the procedure.  TN/NUANCE  D:12/07/2018 T:12/07/2018 JOB:[id number]

## 2018-12-07 NOTE — Brief Op Note (Addendum)
12/07/2018  11:03 AM  PATIENT:  [name]  78 y.o. female  PRE-OPERATIVE DIAGNOSIS:  RIGHT LUNG NODULE  POST-OPERATIVE DIAGNOSIS:  NON SMALL CELL CARCINOMA  PROCEDURE:  RIGHT VIDEO ASSISTED THORACOSCOPY,  RIGHT LOWER LOBE SUPERIOR SEGMENTECTOMY with EN BLOC WEDGE  RESECTION RIGHT UPPER LOBE LYMPH NODE DISSECTION INTERCOSTAL NERVE BLOCK,  SURGEON:  Surgeon(s) and Role:    Melrose Nakayama, MD - Primary  PHYSICIAN ASSISTANT: Lars Pinks PA-C  ANESTHESIA:   general  EBL:  100 mL   BLOOD ADMINISTERED:none  DRAINS: 67 Blake drain chest in the right chest   LOCAL MEDICATIONS USED:  BUPIVICAINE   SPECIMEN:  Source of Specimen:  RLL superior segmentectomy, en bloc wedge RUL, multiple lymph nodes  DISPOSITION OF SPECIMEN:  PATHOLOGY. Frozen section showed NSCC  COUNTS CORRECT:  YES  DICTATION: .Dragon Dictation  PLAN OF CARE: Admit to inpatient   PATIENT DISPOSITION:  PACU - hemodynamically stable.   Delay start of Pharmacological VTE agent (>24hrs) due to surgical blood loss or risk of bleeding: yes

## 2018-12-07 NOTE — Discharge Summary (Addendum)
Physician Discharge Summary       [employer]       [place] [address]             [phone]    Patient ID: [name] MRN: [id number] DOB/AGE: May 12, 1941 78 y.o.  Admit date: 12/07/2018 Discharge date: 12/11/2018  Admission Diagnoses: Right lower lobe pulmonary nodule- suspected T1N0 clincal stage IB non-small cell carcinoma  Discharge Diagnoses:  1. Right lower lobe adenocarcinoma- Pathologic stage IIIA (T1,N2) 2. History of Endometrial cancer ([place]) 3. History of breast cancer 4. History of Hypercholesterolemia 5.History of radiation therapy 6.History of Hypertension 7. History of Hyperlipidemia 8. History of Palpitations 9. History of PVC's (premature ventricular contractions) 10. History of tobacco abuse   Procedure (s):  INTERCOSTAL NERVE BLOCK, RIGHT VIDEO ASSISTED THORACOSCOPY, THORACOSCOPIC ASSISTED RIGHT LOWER LOBE SUPERIOR SEGMENTECTOMY with EN BLOC WEDGE RUL  Pathology:  Diagnosis 1. Lung, resection (segmental or lobe), Right Lobe Superior with endblock wedge of right upper lobe - INVASIVE ADENOCARCINOMA, MODERATELY DIFFERENTIATED, SPANNING 1.6 CM. - THE SURGICAL RESECTION MARGINS ARE NEGATIVE FOR CARCINOMA. - SEE ONCOLOGY TABLE BELOW. 2. Lymph node, biopsy, Level 9 #1 - THERE IS NO EVIDENCE OF CARCINOMA IN 1 OF 1 LYMPH NODE (0/1). 3. Lymph node, biopsy, Level 9 #2 - THERE IS NO EVIDENCE OF CARCINOMA IN 1 OF 1 LYMPH NODE (0/1). 4. Lymph node, biopsy, Level 7 #1 - METASTATIC CARCINOMA IN 1 OF 1 LYMPH NODE (1/1). 5. Lymph node, biopsy, Level 7 #2 - THERE IS NO EVIDENCE OF CARCINOMA IN 1 OF 1 LYMPH NODE (0/1). 6. Lymph node, biopsy, Level 12 #1 - THERE IS NO EVIDENCE OF CARCINOMA IN 1 OF 1 LYMPH NODE (0/1). 7. Lymph node, biopsy, Level 12 #2 - THERE IS NO EVIDENCE OF CARCINOMA IN 1 OF 1 LYMPH NODE (0/1). 8. Lymph node, biopsy, Level 10 #1 - THERE IS NO EVIDENCE OF CARCINOMA IN 1 OF 1 LYMPH NODE (0/1). 9. Lymph node, biopsy, Level 4R  #1 - THERE IS NO EVIDENCE OF CARCINOMA IN 1 OF 1 LYMPH NODE (0/1). 10. Lymph node, biopsy, Level 4R #2 - THERE IS NO EVIDENCE OF CARCINOMA IN 1 OF 1 LYMPH NODE (0/1). 11. Lymph node, biopsy, Level 2R #1 - THERE IS NO EVIDENCE OF CARCINOMA IN 1 OF 1 LYMPH NODE (0/1). 12. Lung, resection (segmental or lobe), Right Lobe Superior - BENIGN LUNG PARENCHYMA. - THERE IS NO EVIDENCE OF MALIGNANCY. TNM Code: pT1b, pN2  History of Presenting Illness: [name] is a 78 year old woman with a past medical history significant for metastatic endometrial cancer, breast cancer, hypertension, hyperlipidemia, PVCs, and multiple rounds of chemo and radiation.  She had a lumpectomy and axillary dissection for breast cancer back around the year 2000.  In 2009 she was diagnosed with endometrial cancer.  She underwent surgery and also chemo and radiation.  In 2012 she had a recurrence again treated with surgery, chemotherapy, and radiation.  In 2014 she underwent a major operation in [place].  In 2017 she was diagnosed with bone metastases and again treated with radiation and chemo.  Her endometrial cancer has been stable over time.  She is followed by Dr. Alvy Bimler.  She has had a vague groundglass opacity in the superior segment of her right lower lobe noted on CT scans dating back to at least 2017.  She recently had a CT of the chest abdomen and pelvis which showed an increase in size of the right lower lobe nodule and  also likely crossing the fissure into the right upper lobe.  On PET CT this area was mildly hypermetabolic.  There also is an area of persistent hypermetabolism in the left L3-L4 paraspinous mass that has been present dating back to 2017.  She walks about 2 miles a day with her husband.  She denies any cough, hemoptysis, wheezing, shortness of breath, chest pain, pressure, or tightness.  She denies any change in appetite or weight loss.  She is a lifelong non-smoker.  She has a right lower lobe  lung nodule.  This is been present going back to 2017 and likely was there before that.  There has been an increase in size between 2017 and now.  That nodule is mildly hypermetabolic on PET/CT.  Findings are consistent with a low-grade adenocarcinoma.  Infectious and inflammatory nodules are also in the differential diagnosis. They are familiar with the surgery from her husband having similar operation previously.  Dr. Roxan Hockey informed Mrs. and Mr. Elmes of the general nature of the operation including the incisions to be used, the need for general anesthesia, the use of a drainage tube postoperatively, the expected hospital stay, and the overall recovery.  Dr. Roxan Hockey informed them of the indications, risks, benefits, and alternatives.    Brief Hospital Course:  The patient remained afebrile and hemodynamically stable. A line and foley were removed early in the post operative course. Chest tube output gradually decreased. Daily chest x rays were obtained and remained stable. Chest tube was placed to water seal on 07/28. Chest tube was removed on 07/29. Patient is ambulating on room air. Patient is tolerating a diet and has had a bowel movement. Wounds are clean and dry. Follow up chest x ray after chest tube removal showed a small right pneumothorax. Chest x ray the following morning showed a questionable increase in the right pneumothorax. She was asymptomatic but was observed one more day. Repeat chest x ray showed a mild decreased in right apical pneumothorax. As discussed with Dr. Roxan Hockey, the patient is felt surgically stable for discharge today.   Latest Vital Signs: Blood pressure (!) 144/67, pulse 81, temperature 98.1 F (36.7 C), temperature source Oral, resp. rate 15, height 4\' 10"  (1.473 m), weight 46.8 kg, SpO2 100 %.  Physical Exam: Cardiovascular: RRR Pulmonary: Mostly clear bilaterally Abdomen: Soft, non tender, bowel sounds present. Extremities: No LE edema Wounds: Clean  and dry. No drainage from chest tube wound.  Discharge Condition: Stable and discharged to home.  Recent laboratory studies:  Lab Results  Component Value Date   WBC 11.5 (H) 12/09/2018   HGB 11.0 (L) 12/09/2018   HCT 34.3 (L) 12/09/2018   MCV 100.0 12/09/2018   PLT 155 12/09/2018   Lab Results  Component Value Date   NA 140 12/09/2018   K 4.0 12/09/2018   CL 108 12/09/2018   CO2 24 12/09/2018   CREATININE 0.77 12/09/2018   GLUCOSE 116 (H) 12/09/2018      Diagnostic Studies: Dg Chest 2 View  Result Date: 12/10/2018 CLINICAL DATA:  Pneumothorax on the right EXAM: CHEST - 2 VIEW COMPARISON:  Yesterday FINDINGS: Mild increase in the right pneumothorax at the apex, now seen below the fourth rib. Increased right perihilar atelectasis. Hyperinflation. Heart size is normal. Aortic tortuosity. Unremarkable porta catheter on the left. IMPRESSION: Mild increase in the small right apical pneumothorax. Electronically Signed   By: Monte Fantasia M.D.   On: 12/10/2018 09:19   Dg Chest 2 View  Result Date: 12/07/2018  CLINICAL DATA:  Preop chest x-ray EXAM: CHEST - 2 VIEW COMPARISON:  12/03/2018 FINDINGS: There is no edema, consolidation, effusion, or pneumothorax. Normal heart size and mediastinal contours. Porta catheter on the left with tip at the SVC. Postoperative right axilla. Advanced lumbar degenerative disease with scoliosis. IMPRESSION: No evidence of active disease. Electronically Signed   By: Monte Fantasia M.D.   On: 12/07/2018 06:46   Dg Chest 2 View  Result Date: 12/03/2018 CLINICAL DATA:  Preoperative study prior to lung surgery. EXAM: CHEST - 2 VIEW COMPARISON:  CT scan November 03, 2018 and chest x-ray April 08, 2013 FINDINGS: The known right-sided nodules not appreciated on this chest x-ray. A left Port-A-Cath terminates in the central SVC. No pneumothorax. Surgical clips are seen in the right axilla. The cardiomediastinal silhouette is stable. No other changes. IMPRESSION: No  acute abnormalities identified. The patient's known right lower lobe nodule is not visualized on this study. Electronically Signed   By: Dorise Bullion III M.D   On: 12/03/2018 13:59   Nm Pet Image Restag (ps) Skull Base To Thigh  Result Date: 11/11/2018 CLINICAL DATA:  Subsequent treatment strategy for enlarging superior segment right lower lobe sub solid pulmonary nodule. History of endometrial cancer with multiple recurrences. History of breast cancer. EXAM: NUCLEAR MEDICINE PET SKULL BASE TO THIGH TECHNIQUE: 5.4 mCi F-18 FDG was injected intravenously. Full-ring PET imaging was performed from the skull base to thigh after the radiotracer. CT data was obtained and used for attenuation correction and anatomic localization. Fasting blood glucose: 101 mg/dl COMPARISON:  11/03/2018 CT chest, abdomen and pelvis. FINDINGS: Mediastinal blood pool activity: SUV max 2.4 Liver activity: SUV max NA NECK: No hypermetabolic lymph nodes in the neck. Incidental CT findings: Mucoperiosteal thickening throughout bilateral maxillary sinuses. Stable hypodense 1.2 cm right thyroid nodule without hypermetabolism. Left internal jugular Port-A-Cath terminates in the lower third of the SVC. CHEST: No hypermetabolic axillary, mediastinal or hilar lymph nodes. Subsolid irregular superior segment right lower lobe 2.1 cm pulmonary nodule demonstrates low level metabolism with max SUV 2.0 (series 8/image 28) increased from 1.3 cm with max SUV 1.5 on 02/12/2016 PET-CT. No hypermetabolic pulmonary findings. Incidental CT findings: Coronary atherosclerosis. Atherosclerotic nonaneurysmal thoracic aorta. Surgical clips noted in the right axilla. No acute consolidative airspace disease or additional significant pulmonary nodules. ABDOMEN/PELVIS: There is persistent hypermetabolism (max SUV 8.0) associated with left L3-4 paraspinous mass, which is poorly delineated on the noncontrast CT images, previous max SUV 8.9 on 02/12/2016 PET-CT, mildly  decreased. No abnormal hypermetabolic activity within the liver, pancreas, adrenal glands, or spleen. No hypermetabolic lymph nodes in the abdomen or pelvis. No hypermetabolic peritoneal implants. No ascites. Incidental CT findings: Cholelithiasis. Atherosclerotic nonaneurysmal abdominal aorta. Moderate sigmoid diverticulosis. Hysterectomy. SKELETON: No focal hypermetabolic activity to suggest skeletal metastasis. Incidental CT findings: none IMPRESSION: 1. Low level metabolism (max SUV 2.0) associated with the irregular subsolid superior segment right lower lobe 2.1 cm pulmonary nodule. As better depicted on the recent diagnostic chest CT study, this nodule crosses the major fissure into the right upper lobe and has increased in size and density on multiple imaging studies back to 2009. Findings are most suggestive of primary bronchogenic adenocarcinoma. 2. No hypermetabolic thoracic adenopathy. 3. Persistent hypermetabolism (max SUV 8.0) associated with the known left L3-4 paraspinous metastasis, mildly decreased in metabolism since 02/12/2016 PET-CT. 4. No additional sites of hypermetabolic metastatic disease in the abdomen or pelvis. No metabolic evidence of peritoneal recurrence. No ascites. 5. Chronic findings include: Aortic Atherosclerosis (  ICD10-I70.0). Coronary atherosclerosis. Chronic bilateral maxillary sinusitis. Cholelithiasis. Moderate sigmoid diverticulosis. Electronically Signed   By: Ilona Sorrel M.D.   On: 11/11/2018 12:09   Dg Chest 1v Repeat Same Day  Result Date: 12/09/2018 CLINICAL DATA:  Chest tube removal EXAM: CHEST - 1 VIEW SAME DAY COMPARISON:  12/09/2018 FINDINGS: Interval removal of the right-sided chest tube. Small right apical pneumothorax measuring less than 10%. Right basilar atelectasis. Stable cardiomediastinal silhouette. Left-sided Port-A-Cath in satisfactory position. IMPRESSION: Interval removal of the right-sided chest tube. Small right apical pneumothorax measuring less  than 10%. Electronically Signed   By: Kathreen Devoid   On: 12/09/2018 13:04   Dg Chest Port 1 View  Result Date: 12/09/2018 CLINICAL DATA:  Patient status post right thoracoscopy 12/07/2018 4 right lower lobe superior segmentectomy and wedge resection of the right upper lobe. EXAM: PORTABLE CHEST 1 VIEW COMPARISON:  Single-view of the chest 12/08/2018 and 12/07/2018. FINDINGS: Port-A-Cath and right chest tube remain in place. Small right apical pneumothorax estimated at 10% is new since yesterday's examination. The left lung is expanded and clear. Volume loss in the right chest and mild right basilar atelectasis noted. Heart size is enlarged. Aortic atherosclerosis. IMPRESSION: Small right pneumothorax estimated at 10% is new since yesterday's examination. No other change. Electronically Signed   By: Inge Rise M.D.   On: 12/09/2018 08:38   Dg Chest Port 1 View  Result Date: 12/08/2018 CLINICAL DATA:  Chest tube present S/ P lung surg,,sore chest today EXAM: PORTABLE CHEST 1 VIEW COMPARISON:  12/07/2018 FINDINGS: Patient has a LEFT-sided power port, tip to the superior vena cava. RIGHT-sided chest tube is unchanged in position. No pneumothorax. Stable elevation of the RIGHT hemidiaphragm. Heart size is normal and accentuated by technique. Mild atelectasis at both lung bases. No edema. IMPRESSION: 1. Bibasilar atelectasis. 2. No pneumothorax. Electronically Signed   By: Nolon Nations M.D.   On: 12/08/2018 09:23   Dg Chest Port 1 View  Result Date: 12/07/2018 CLINICAL DATA:  Status post VATS. EXAM: PORTABLE CHEST 1 VIEW COMPARISON:  Prior study same day. FINDINGS: Chest tube noted with tip over the right apex. Tiny right lateral pneumothorax cannot be excluded. PowerPort catheter stable position. Surgical clips right chest. Mild bibasilar atelectasis. Stable cardiomegaly. Mild right chest wall subcutaneous emphysema. IMPRESSION: Interim placement of right chest tube. Tiny right lateral pneumothorax  cannot be excluded. Mild right chest wall subcutaneous emphysema. Electronically Signed   By: Marcello Moores  Register   On: 12/07/2018 12:29         Discharge Medications: Allergies as of 12/11/2018      Reactions   Ace Inhibitors Anaphylaxis   Shortness of breath   Codeine Nausea Only   Demerol  [meperidine Hcl]    Dilaudid [hydromorphone Hcl] Other (See Comments)   "total loss of her mind"      Medication List    TAKE these medications   amLODipine 5 MG tablet Commonly known as: NORVASC Take 5 mg by mouth daily.   atenolol 25 MG tablet Commonly known as: TENORMIN Take 25 mg by mouth 2 (two) times daily.   Biotin 5000 MCG Tabs Take 5,000 mcg by mouth daily.   lidocaine-prilocaine cream Commonly known as: EMLA Apply to [facility] cath 1-2 hours prior to access as directed   LORazepam 0.5 MG tablet Commonly known as: ATIVAN 1 tablet po 30 minutes prior to radiation or MRI   Magnesium 250 MG Tabs Take 250 mg by mouth daily.   multivitamin with minerals  Tabs tablet Take 1 tablet by mouth daily.   potassium gluconate 595 (99 K) MG Tabs tablet Take 595 mg by mouth daily.   simvastatin 20 MG tablet Commonly known as: ZOCOR Take 20 mg by mouth every evening.   traMADol 50 MG tablet Commonly known as: ULTRAM Take 1 tablet (50 mg total) by mouth every 6 (six) hours as needed (mild pain).   TURMERIC PO Take 1 capsule by mouth daily.       Follow Up Appointments: Follow-up Information    Melrose Nakayama, MD. Go on 12/23/2018.   Specialty: Cardiothoracic Surgery Why: PA/LAT CXR to be taken (at [facility] which is in the same building as Dr. Leonarda Salon office) on 08/12 at 3:45 pm;Appointment time is at 4:15 pm Contact information: [place] [address] [phone]        Shon Baton, MD. Call.   Specialty: Internal Medicine Why: for a follow up appointment regarding blood pressure management Contact information: [place] [address] [phone]           Signed: Sharalyn Ink [facility] 12/11/2018, 8:08 AM

## 2018-12-08 ENCOUNTER — Inpatient Hospital Stay (HOSPITAL_COMMUNITY): Payer: Medicare Other

## 2018-12-08 ENCOUNTER — Encounter (HOSPITAL_COMMUNITY): Payer: Self-pay | Admitting: Thoracic Surgery (Cardiothoracic Vascular Surgery)

## 2018-12-08 LAB — BLOOD GAS, ARTERIAL
Acid-Base Excess: 1.2 mmol/L (ref 0.0–2.0)
Bicarbonate: 24.5 mmol/L (ref 20.0–28.0)
Drawn by: 50222
O2 Saturation: 96.6 %
Patient temperature: 98.6
pCO2 arterial: 33.5 mmHg (ref 32.0–48.0)
pH, Arterial: 7.477 — ABNORMAL HIGH (ref 7.350–7.450)
pO2, Arterial: 79.6 mmHg — ABNORMAL LOW (ref 83.0–108.0)

## 2018-12-08 LAB — BASIC METABOLIC PANEL
Anion gap: 8 (ref 5–15)
BUN: 16 mg/dL (ref 8–23)
CO2: 24 mmol/L (ref 22–32)
Calcium: 8.4 mg/dL — ABNORMAL LOW (ref 8.9–10.3)
Chloride: 108 mmol/L (ref 98–111)
Creatinine, Ser: 0.85 mg/dL (ref 0.44–1.00)
GFR calc Af Amer: >60 mL/min (ref >60)
GFR calc non Af Amer: >60 mL/min (ref >60)
Glucose, Bld: 114 mg/dL — ABNORMAL HIGH (ref 70–99)
Potassium: 3.7 mmol/L (ref 3.5–5.1)
Sodium: 140 mmol/L (ref 135–145)

## 2018-12-08 LAB — GLUCOSE, CAPILLARY
Glucose-Capillary: 180 mg/dL — ABNORMAL HIGH (ref 70–99)
Glucose-Capillary: 90 mg/dL (ref 70–99)

## 2018-12-08 LAB — CBC
HCT: 31.6 % — ABNORMAL LOW (ref 36.0–46.0)
Hemoglobin: 10.6 g/dL — ABNORMAL LOW (ref 12.0–15.0)
MCH: 32.2 pg (ref 26.0–34.0)
MCHC: 33.5 g/dL (ref 30.0–36.0)
MCV: 96 fL (ref 80.0–100.0)
Platelets: 152 10*3/uL (ref 150–400)
RBC: 3.29 MIL/uL — ABNORMAL LOW (ref 3.87–5.11)
RDW: 13 % (ref 11.5–15.5)
WBC: 11 10*3/uL — ABNORMAL HIGH (ref 4.0–10.5)
nRBC: 0 % (ref 0.0–0.2)

## 2018-12-08 MED ORDER — GUAIFENESIN ER 600 MG PO TB12: 600.0000 mg | ORAL_TABLET | Freq: Two times a day (BID) | ORAL | Status: DC | PRN

## 2018-12-08 MED ORDER — POTASSIUM CHLORIDE CRYS ER 20 MEQ PO TBCR
30.0000 meq | EXTENDED_RELEASE_TABLET | Freq: Once | ORAL | Status: AC
  Administered 2018-12-08: 30 meq via ORAL
  Filled 2018-12-08: qty 1

## 2018-12-08 MED ORDER — ENOXAPARIN SODIUM 40 MG/0.4ML ~~LOC~~ SOLN
40.0000 mg | SUBCUTANEOUS | Status: DC
  Administered 2018-12-08 – 2018-12-11 (×4): 40 mg via SUBCUTANEOUS
  Filled 2018-12-08 (×4): qty 0.4

## 2018-12-08 NOTE — Progress Notes (Addendum)
      [address]       [place] [address]             [phone]       1 Day Post-Op Procedure(s) (LRB): VIDEO ASSISTED THORACOSCOPY (Right) RIGHT LOWER LOBE SUPERIOR SEGMENTECTOMY (Right)  Subjective: Patient with occasional mucous and minor incisional pain on the right.  Objective: Vital signs in last 24 hours: Temp:  [96.8 F (36 C)-99.4 F (37.4 C)] 99.4 F (37.4 C) (07/28 0355) Pulse Rate:  [61-81] 67 (07/28 0355) Cardiac Rhythm: Normal sinus rhythm (07/27 2030) Resp:  [10-23] 14 (07/28 0355) BP: (89-132)/(42-71) 132/65 (07/28 0355) SpO2:  [94 %-100 %] 100 % (07/28 0355) Arterial Line BP: (87-113)/(32-46) 113/41 (07/27 1315) Weight:  [46.8 kg] 46.8 kg (07/27 1833)     Intake/Output from previous day: 07/27 0701 - 07/28 0700 In: 1200 [I.V.:1200] Out: 2420 [Urine:2045; Blood:100; Chest Tube:275]   Physical Exam:  Cardiovascular: RRR Pulmonary: Clear to auscultation bilaterally; no rales, wheezes, or rhonchi. Abdomen: Soft, non tender, bowel sounds present. Extremities: SCDs in place Wounds: Dressing is clean and dry.   Chest Tube: to suction, no air leak  Lab Results: CBC: Recent Labs    12/08/18 0329  WBC 11.0*  HGB 10.6*  HCT 31.6*  PLT 152   BMET:  Recent Labs    12/08/18 0329  NA 140  K 3.7  CL 108  CO2 24  GLUCOSE 114*  BUN 16  CREATININE 0.85  CALCIUM 8.4*    PT/INR: No results for input(s): LABPROT, INR in the last 72 hours. ABG:  INR: Will add last result for INR, ABG once components are confirmed Will add last 4 CBG results once components are confirmed  Assessment/Plan:  1. CV - SR in the 60's. On Atenolol 12.5 mg bid (with parameters), which is at a reduced dose from pre op dose. Also, on Amlodipine. 2.  Pulmonary - Chest tube with 275 cc output since surgery. Chest tube is to suction. There is no air leak. CXR this am appears stable. Place chest tube to water seal. Check CXR in am. Encourage incentive  spirometer. Await final pathology. 3. Expected ABL anemia-H and H this am 10.6 and 31.6 4. Supplement potassium 5. Remove foley and decrease IVF  6. CBGs 121/180/90. No prior history of DM. Will stop accu checks and SS PRN.  Donielle M ZimmermanPA-C 12/08/2018,7:07 AM [phone]  Patient seen and examined Agree with above  Remo Lipps C. Roxan Hockey, MD [facility] [phone]

## 2018-12-09 ENCOUNTER — Inpatient Hospital Stay (HOSPITAL_COMMUNITY): Payer: Medicare Other

## 2018-12-09 LAB — COMPREHENSIVE METABOLIC PANEL
ALT: 11 U/L (ref 0–44)
AST: 27 U/L (ref 15–41)
Albumin: 3.2 g/dL — ABNORMAL LOW (ref 3.5–5.0)
Alkaline Phosphatase: 36 U/L — ABNORMAL LOW (ref 38–126)
Anion gap: 8 (ref 5–15)
BUN: 17 mg/dL (ref 8–23)
CO2: 24 mmol/L (ref 22–32)
Calcium: 8.8 mg/dL — ABNORMAL LOW (ref 8.9–10.3)
Chloride: 108 mmol/L (ref 98–111)
Creatinine, Ser: 0.77 mg/dL (ref 0.44–1.00)
GFR calc Af Amer: >60 mL/min (ref >60)
GFR calc non Af Amer: >60 mL/min (ref >60)
Glucose, Bld: 116 mg/dL — ABNORMAL HIGH (ref 70–99)
Potassium: 4 mmol/L (ref 3.5–5.1)
Sodium: 140 mmol/L (ref 135–145)
Total Bilirubin: 0.9 mg/dL (ref 0.3–1.2)
Total Protein: 5.8 g/dL — ABNORMAL LOW (ref 6.5–8.1)

## 2018-12-09 LAB — CBC
HCT: 34.3 % — ABNORMAL LOW (ref 36.0–46.0)
Hemoglobin: 11 g/dL — ABNORMAL LOW (ref 12.0–15.0)
MCH: 32.1 pg (ref 26.0–34.0)
MCHC: 32.1 g/dL (ref 30.0–36.0)
MCV: 100 fL (ref 80.0–100.0)
Platelets: 155 10*3/uL (ref 150–400)
RBC: 3.43 MIL/uL — ABNORMAL LOW (ref 3.87–5.11)
RDW: 13.2 % (ref 11.5–15.5)
WBC: 11.5 10*3/uL — ABNORMAL HIGH (ref 4.0–10.5)
nRBC: 0 % (ref 0.0–0.2)

## 2018-12-09 NOTE — Progress Notes (Signed)
20 cc of fentanyl pca wasted in steri-cycle with Tammpy P. RN

## 2018-12-09 NOTE — Progress Notes (Addendum)
      [address]       [place] [address]             [phone]       2 Days Post-Op Procedure(s) (LRB): VIDEO ASSISTED THORACOSCOPY (Right) RIGHT LOWER LOBE SUPERIOR SEGMENTECTOMY (Right)  Subjective: Patient had increased incisional pain on the right yesterday, but seems a little less this am.  Objective: Vital signs in last 24 hours: Temp:  [97.5 F (36.4 C)-99.1 F (37.3 C)] 97.5 F (36.4 C) (07/29 0328) Pulse Rate:  [64-73] 67 (07/28 1925) Cardiac Rhythm: Normal sinus rhythm (07/29 0700) Resp:  [13-23] 13 (07/29 0328) BP: (104-141)/(70-83) 133/72 (07/29 0328) SpO2:  [94 %-100 %] 98 % (07/28 1925) FiO2 (%):  [96 %] 96 % (07/28 1624)     Intake/Output from previous day: 07/28 0701 - 07/29 0700 In: 517 [P.O.:517] Out: 1060 [Urine:900; Chest Tube:160]   Physical Exam:  Cardiovascular: RRR Pulmonary: Clear to auscultation bilaterally Abdomen: Soft, non tender, bowel sounds present. Extremities: No LE edema Wounds: Dressing is clean and dry.   Chest Tube: to water seal, no air leak  Lab Results: CBC: Recent Labs    12/08/18 0329 12/09/18 0347  WBC 11.0* 11.5*  HGB 10.6* 11.0*  HCT 31.6* 34.3*  PLT 152 155   BMET:  Recent Labs    12/08/18 0329 12/09/18 0347  NA 140 140  K 3.7 4.0  CL 108 108  CO2 24 24  GLUCOSE 114* 116*  BUN 16 17  CREATININE 0.85 0.77  CALCIUM 8.4* 8.8*    PT/INR: No results for input(s): LABPROT, INR in the last 72 hours. ABG:  INR: Will add last result for INR, ABG once components are confirmed Will add last 4 CBG results once components are confirmed  Assessment/Plan:  1. CV - SR in the 60's. On Atenolol 12.5 mg bid (with parameters), which is at a reduced dose from pre op dose. Also, on Amlodipine. 2.  Pulmonary - Chest tube with 160 cc output since surgery. Chest tube is to water seal There is no air leak. CXR this am appears stable.Hope to remove chest tube. Check CXR in am. Encourage incentive  spirometer. Await final pathology. 3. Expected ABL anemia-H and H this am stable at 11 and 34.3 4. Regarding pain control, she prefers Ultram orally. I did offer a few doses of Toradol but she stated she is "ok with pain this am". 5. D/c PCA once chest tube removed  Donielle M ZimmermanPA-C 12/09/2018,7:06 AM [phone] Patient seen and examined, agree with above Pain better since earlier this AM No air leak and minimal drainage- dc CT Continue ambulation Path pending  Remo Lipps C. Roxan Hockey, MD [facility] [phone]

## 2018-12-10 ENCOUNTER — Inpatient Hospital Stay (HOSPITAL_COMMUNITY): Payer: Medicare Other

## 2018-12-10 MED ORDER — TRAMADOL HCL 50 MG PO TABS: 50.0000 mg | ORAL_TABLET | Freq: Four times a day (QID) | ORAL | 0 refills | Status: DC | PRN

## 2018-12-10 NOTE — Progress Notes (Addendum)
      [address]       [place] [address]             [phone]       3 Days Post-Op Procedure(s) (LRB): VIDEO ASSISTED THORACOSCOPY (Right) RIGHT LOWER LOBE SUPERIOR SEGMENTECTOMY (Right)  Subjective: Patient has not had a bowel movement yet but states she is not uncomfortable;otherwise, no complaints.  Objective: Vital signs in last 24 hours: Temp:  [98.1 F (36.7 C)-99.6 F (37.6 C)] 99.6 F (37.6 C) (07/30 0325) Pulse Rate:  [68-77] 68 (07/29 1932) Cardiac Rhythm: Normal sinus rhythm (07/30 0700) Resp:  [12-19] 18 (07/30 0325) BP: (111-148)/(52-82) 131/75 (07/30 0325) SpO2:  [97 %-100 %] 100 % (07/30 0325)     Intake/Output from previous day: 07/29 0701 - 07/30 0700 In: 240 [P.O.:240] Out: -    Physical Exam:  Cardiovascular: RRR Pulmonary: Clear to auscultation on left and diminished right apex Abdomen: Soft, non tender, bowel sounds present. Extremities: No LE edema Wounds: Dressing removed and wound is clean and dry. No drainage from chest tube wound.   Lab Results: CBC: Recent Labs    12/08/18 0329 12/09/18 0347  WBC 11.0* 11.5*  HGB 10.6* 11.0*  HCT 31.6* 34.3*  PLT 152 155   BMET:  Recent Labs    12/08/18 0329 12/09/18 0347  NA 140 140  K 3.7 4.0  CL 108 108  CO2 24 24  GLUCOSE 114* 116*  BUN 16 17  CREATININE 0.85 0.77  CALCIUM 8.4* 8.8*    PT/INR: No results for input(s): LABPROT, INR in the last 72 hours. ABG:  INR: Will add last result for INR, ABG once components are confirmed Will add last 4 CBG results once components are confirmed  Assessment/Plan:  1. CV - SR in the 70's this am. On Atenolol 12.5 mg bid (with parameters), but will resume 25 mg bid as taken prior to surgery at discharge. Also, on Amlodipine. 2.  Pulmonary - Chest tube removed yesterday. CXR this am shows small right apical pneumothorax.  Encourage incentive spirometer. Final pathology:Lymph node, biopsy, Level 7 #1. - METASTATIC  CARCINOMA IN 1 OF 1 LYMPH NODE (1/1).TNM Code: pT1b, pN2. Dr. Roxan Hockey to discuss with patient. 3. Expected ABL anemia-Last H and H  stable at 11 and 34.3 4. Will discuss discharge disposition with Dr. Edman Circle M ZimmermanPA-C 12/10/2018,7:07 AM [phone]  Patient seen and examined, agree with above Path T1N2- stage IIIA- d/w patient CXR shows possible slight increase in size of pneumo, may just be due to PA vs portable film, but I think it is best if we observe her one more day and repeat CXR in AM  [place] C. Roxan Hockey, MD [facility] [phone]

## 2018-12-10 NOTE — Care Management Important Message (Signed)
Important Message  Patient Details  Name: [name] MRN: [id number] Date of Birth: 11/12/1940   Medicare Important Message Given:  Yes     Memory Argue 12/10/2018, 1:47 PM

## 2018-12-10 NOTE — Plan of Care (Signed)
  Problem: Clinical Measurements: Goal: Will remain free from infection Outcome: Progressing Goal: Respiratory complications will improve Outcome: Progressing Goal: Cardiovascular complication will be avoided Outcome: Progressing   Problem: Activity: Goal: Risk for activity intolerance will decrease Outcome: Progressing   Problem: Nutrition: Goal: Adequate nutrition will be maintained Outcome: Progressing   Problem: Coping: Goal: Level of anxiety will decrease Outcome: Progressing   Problem: Elimination: Goal: Will not experience complications related to bowel motility Outcome: Progressing Goal: Will not experience complications related to urinary retention Outcome: Progressing   Problem: Pain Managment: Goal: General experience of comfort will improve Outcome: Progressing   Problem: Safety: Goal: Ability to remain free from injury will improve Outcome: Progressing   Problem: Skin Integrity: Goal: Risk for impaired skin integrity will decrease Outcome: Progressing   Problem: Education: Goal: Knowledge of disease or condition will improve Outcome: Progressing Goal: Knowledge of the prescribed therapeutic regimen will improve Outcome: Progressing   Problem: Activity: Goal: Risk for activity intolerance will decrease Outcome: Progressing   Problem: Cardiac: Goal: Will achieve and/or maintain hemodynamic stability Outcome: Progressing   Problem: Clinical Measurements: Goal: Postoperative complications will be avoided or minimized Outcome: Progressing   Problem: Respiratory: Goal: Respiratory status will improve Outcome: Progressing   Problem: Pain Management: Goal: Pain level will decrease Outcome: Progressing   Problem: Skin Integrity: Goal: Wound healing without signs and symptoms infection will improve Outcome: Progressing

## 2018-12-10 NOTE — Plan of Care (Signed)
Pt progressing towards care plan goals.

## 2018-12-11 ENCOUNTER — Inpatient Hospital Stay (HOSPITAL_COMMUNITY): Payer: Medicare Other

## 2018-12-11 LAB — GLUCOSE, CAPILLARY: Glucose-Capillary: 97 mg/dL (ref 70–99)

## 2018-12-11 NOTE — Plan of Care (Signed)
  Problem: Clinical Measurements: Goal: Will remain free from infection Outcome: Adequate for Discharge Goal: Respiratory complications will improve Outcome: Adequate for Discharge Goal: Cardiovascular complication will be avoided Outcome: Adequate for Discharge

## 2018-12-11 NOTE — Progress Notes (Addendum)
      [address]       [place] [address]             [phone]       4 Days Post-Op Procedure(s) (LRB): VIDEO ASSISTED THORACOSCOPY (Right) RIGHT LOWER LOBE SUPERIOR SEGMENTECTOMY (Right)  Subjective: Patient without complaints this am. She hopes to go home.  Objective: Vital signs in last 24 hours: Temp:  [98.1 F (36.7 C)-99.7 F (37.6 C)] 98.1 F (36.7 C) (07/31 0338) Pulse Rate:  [72-97] 81 (07/30 1947) Cardiac Rhythm: Normal sinus rhythm (07/31 0400) Resp:  [10-23] 15 (07/31 0338) BP: (114-144)/(55-69) 144/67 (07/31 0338) SpO2:  [97 %-100 %] 100 % (07/30 1947)     Intake/Output from previous day: 07/30 0701 - 07/31 0700 In: 600 [P.O.:600] Out: -    Physical Exam:  Cardiovascular: RRR Pulmonary: Mostly clear bilaterally Abdomen: Soft, non tender, bowel sounds present. Extremities: No LE edema Wounds: Clean and dry. No drainage from chest tube wound.   Lab Results: CBC: Recent Labs    12/09/18 0347  WBC 11.5*  HGB 11.0*  HCT 34.3*  PLT 155   BMET:  Recent Labs    12/09/18 0347  NA 140  K 4.0  CL 108  CO2 24  GLUCOSE 116*  BUN 17  CREATININE 0.77  CALCIUM 8.8*    PT/INR: No results for input(s): LABPROT, INR in the last 72 hours. ABG:  INR: Will add last result for INR, ABG once components are confirmed Will add last 4 CBG results once components are confirmed  Assessment/Plan:  1. CV - SR in the 90's this am. On Atenolol 12.5 mg bid (with parameters), but will resume 25 mg bid as taken prior to surgery at discharge as more hypertensive. Also, on Amlodipine. 2.  Pulmonary -  CXR this am shows decreased right apical pneumothorax. Encourage incentive spirometer. 3. Expected ABL anemia-Last H and H  stable at 11 and 34.3 4. Likely discharge once CXR officially interpreted   Sharalyn Ink [facility] 12/11/2018,7:08 AM [phone]  Patient seen and examined, agree with above Pneumo on R is stable, she is not  sympomatic Dc home today  Revonda Standard. Roxan Hockey, MD [facility] [phone]

## 2018-12-15 ENCOUNTER — Telehealth: Payer: Self-pay | Admitting: Oncology

## 2018-12-15 NOTE — Telephone Encounter (Signed)
Gordan Payment and scheduled an appointment to see Dr. Alvy Bimler on 12/24/18 at 9 am.  She verbalized understanding and agreement.

## 2018-12-17 ENCOUNTER — Other Ambulatory Visit: Payer: Self-pay | Admitting: *Deleted

## 2018-12-17 ENCOUNTER — Encounter: Payer: Self-pay | Admitting: *Deleted

## 2018-12-17 NOTE — Progress Notes (Signed)
The proposed treatment discussed in cancer conference 12/17/2018 is for discussion purpose only and is not a binding recommendation.  The patient was not physically examined nor present for their treatment options.  Therefore, final treatment plans cannot be decided.

## 2018-12-17 NOTE — Progress Notes (Signed)
Oncology Nurse Navigator Documentation  Oncology Nurse Navigator Flowsheets 12/17/2018  Diagnosis Status Confirmed Diagnosis Complete  Navigator Follow Up Date: 12/24/2018  Navigator Follow Up Reason: New [place] Appointment  Navigator Location [facility]  Navigator Encounter Type Other  Telephone -  Abnormal Finding Date 11/03/2018  Confirmed Diagnosis Date 12/07/2018  Treatment Phase Pre-Tx/Tx Discussion  Barriers/Navigation Needs Coordination of Care/patient case was discussed at cancer conference and recommendation is to send tissue for molecular and PDL 1 testing.  I updated Dr. Alvy Bimler and she would like these test to be sent out.  I updated pathology to send and was updated by Varney Biles this will go out today.    Interventions Coordination of Care  Coordination of Care Other  Acuity Level 2  Acuity Level 1 -  Time Spent with Patient 45

## 2018-12-18 ENCOUNTER — Other Ambulatory Visit: Payer: Self-pay | Admitting: Thoracic Surgery (Cardiothoracic Vascular Surgery)

## 2018-12-18 DIAGNOSIS — C3491 Malignant neoplasm of unspecified part of right bronchus or lung: Secondary | ICD-10-CM

## 2018-12-21 ENCOUNTER — Encounter: Payer: Self-pay | Admitting: Thoracic Surgery (Cardiothoracic Vascular Surgery)

## 2018-12-21 ENCOUNTER — Ambulatory Visit
Admission: RE | Admit: 2018-12-21 | Discharge: 2018-12-21 | Disposition: A | Discharge status: Home / Self Care | Payer: Medicare Other | Source: Ambulatory Visit | Attending: Thoracic Surgery (Cardiothoracic Vascular Surgery) | Admitting: Thoracic Surgery (Cardiothoracic Vascular Surgery)

## 2018-12-21 ENCOUNTER — Other Ambulatory Visit: Payer: Self-pay

## 2018-12-21 ENCOUNTER — Ambulatory Visit (INDEPENDENT_AMBULATORY_CARE_PROVIDER_SITE_OTHER): Payer: Self-pay | Admitting: Thoracic Surgery (Cardiothoracic Vascular Surgery)

## 2018-12-21 VITALS — BP 124/86 | HR 73 | Temp 97.5°F | Resp 16 | Ht <= 58 in | Wt 108.0 lb

## 2018-12-21 DIAGNOSIS — R911 Solitary pulmonary nodule: Secondary | ICD-10-CM

## 2018-12-21 DIAGNOSIS — C3491 Malignant neoplasm of unspecified part of right bronchus or lung: Secondary | ICD-10-CM

## 2018-12-21 DIAGNOSIS — J90 Pleural effusion, not elsewhere classified: Secondary | ICD-10-CM | POA: Diagnosis not present

## 2018-12-21 NOTE — Progress Notes (Signed)
[address]       [place] [address]             [phone]       HPI: [name] returns for a scheduled follow-up visit  [name] is a 78 year old woman with: Breast cancer he had a CT of the chest which showed an increase in size of a groundglass nodule involves the superior segment of the right lower lobe as well as the fissure into the inferior aspect of the right middle lobe.  I did a right VATS for right lower lobe superior segmentectomy with en bloc resection of a segment of the right upper lobe and lymph node dissection on 12/07/2018.  The lesion turned out to be a primary lung adenocarcinoma.  Unfortunately she did have a positive N2 node so it is stage IIIa disease.  She did well postoperatively and went home on day 4.  She has some incisional discomfort.  She is taking 1 and sometimes 2 oxycodone tablets a day.  She is also taking some Tylenol.  Overall the pain is not as severe as she expected.  She is not having any respiratory issues.  Overall she feels well.  Past Medical History:  Diagnosis Date  . Endometrial cancer ([place]) 12/2007   s/p total abdominal hysterectomy at [facility] - ovaries were also removed   . Family history of breast cancer   . History of breast cancer    right breast -- treated with lumpectomy and radiation therarpy, postoperatively with tamoxifen   . History of radiation therapy 10/22/2016 to 10/28/2016  . Hypercholesterolemia   . Hyperlipidemia   . Hypertension   . Palpitations   . Personal history of radiation therapy 2006  . PVC's (premature ventricular contractions)   . Radiation 07/11/14-08/12/14   left lumbar paraspinal area 55 gray  . S/P radiation therapy 10/28/11-12/05/11   5040 cGy left pelvis  . S/P radiation therapy    Intracavitary brachytherapy of uterus    Current Outpatient Medications  Medication Sig Dispense Refill  . amLODipine (NORVASC) 5 MG tablet Take 5 mg by mouth daily.    Marland Kitchen atenolol (TENORMIN)  25 MG tablet Take 25 mg by mouth 2 (two) times daily.     . Biotin 5000 MCG TABS Take 5,000 mcg by mouth daily.    Marland Kitchen LORazepam (ATIVAN) 0.5 MG tablet 1 tablet po 30 minutes prior to radiation or MRI 30 tablet 0  . Magnesium 250 MG TABS Take 250 mg by mouth daily.     . Multiple Vitamin (MULTIVITAMIN WITH MINERALS) TABS tablet Take 1 tablet by mouth daily.    . potassium gluconate 595 MG TABS tablet Take 595 mg by mouth daily.     . simvastatin (ZOCOR) 20 MG tablet Take 20 mg by mouth every evening.     . traMADol (ULTRAM) 50 MG tablet Take 1 tablet (50 mg total) by mouth every 6 (six) hours as needed (mild pain). 30 tablet 0  . TURMERIC PO Take 1 capsule by mouth daily.    Marland Kitchen lidocaine-prilocaine (EMLA) cream Apply to [facility] cath 1-2 hours prior to access as directed (Patient not taking: Reported on 12/01/2018) 30 g 1   No current facility-administered medications for this visit.    Facility-Administered Medications Ordered in Other Visits  Medication Dose Route Frequency Provider Last Rate Last Dose  . heparin lock flush 100 unit/mL  500 Units Intracatheter Once Heath Lark, MD      .  sodium chloride flush (NS) 0.9 % injection 10 mL  10 mL Intracatheter Once Heath Lark, MD        Physical Exam BP 124/86 (BP Location: Left Arm, Patient Position: Sitting, Cuff Size: Small)   Pulse 73   Temp (!) 97.5 F (36.4 C) (Other (Comment)) Comment (Src): tympanic  Resp 16   Ht 4\' 10"  (1.473 m)   Wt 108 lb (49 kg)   SpO2 95% Comment: RA  BMI 22.88 kg/m  78 year old woman in no acute distress Alert and oriented x3 with no focal deficits Lungs clear with equal breath sounds bilaterally Cardiac regular rate and rhythm normal S1 and S2 No peripheral edema Incision and chest tube site healing well  Diagnostic Tests: CHEST - 2 VIEW  COMPARISON:  December 11, 2018  FINDINGS: The left-sided Port-A-Cath is unchanged in positioning. There is a small to moderate-sized right-sided pleural effusion  which appears to have increased in size from prior study. There is an opacity in the right mid lung zone which is similar to prior study. The lungs are somewhat hyperexpanded. There are emphysematous changes bilaterally. There may be a trace residual right apical pneumothorax. Surgical clips project over the patient's right chest wall. There is no acute osseous abnormality.  IMPRESSION: 1. Probable trace residual right apical pneumothorax. 2. Small to moderate size right-sided pleural effusion. 3. Stable appearance of the right mid lung zone. 4. Stable positioning of the left-sided Port-A-Cath.  These results will be called to the ordering clinician or representative by the Radiologist Assistant, and communication documented in the PACS or zVision Dashboard.   Electronically Signed   By: Constance Holster M.D.   On: 12/21/2018 15:07 I personally reviewed the chest x-ray images .  I disagree with small moderate sized right pleural effusion.  If anything there is an extremely small effusion.  Impression: [name] is a 78 year old woman who is a lifelong non-smoker.  She has a history of endometrial and breast cancer.  She has had a lung nodule in the superior segment of the right lower lobe that had progressed over time.  I did a right lower lobe superior segmentectomy with en bloc removal of part of the right upper lobe.  Lesion turned out to be a primary lung adenocarcinoma.  Unfortunately she did have a level 7 node that was positive.  Stage IIIa adenocarcinoma of the lung-she has an appointment with Dr. Alvy Bimler later this week to discuss adjuvant therapy.  Tissue was sent for foundation PDL 1 testing  From a surgical perspective she is doing extremely well.  She is having minimal discomfort.  There are no strict restrictions on her activities but I advised her to avoid any heavy lifting or straining physical exertion for another couple of weeks.  She should wait another week  before driving.  Appropriate precautions at that point were discussed.  Plan: Follow-up with Dr. Alvy Bimler as scheduled Return in 6 weeks with PA and lateral chest x-ray  Kelly Nakayama, MD [facility] [phone]

## 2018-12-23 ENCOUNTER — Encounter: Payer: Medicare Other | Admitting: Thoracic Surgery (Cardiothoracic Vascular Surgery)

## 2018-12-23 NOTE — Assessment & Plan Note (Addendum)
I have reviewed the final pathology report with the patient and discussed the role of adjuvant treatment, intent of treatment is of curative We reviewed the current guidelines I would like to get a baseline MRI of the brain for staging. I recommend 4 cycles of carboplatinum and pemetrexed for treatment I plan to start her on treatment within 4 weeks of surgery I recommend chemo education class today We discussed the use of vitamin W44 and folic acid supplement to prevent anemia I will see her in 2 weeks at the start of treatment and will review imaging study with her I have sent messages to radiation oncologist to coordinate future appointment  The risks, benefits, side effects of carboplatin and pemetrexed were discussed including risk of allergic reaction, nausea, pancytopenia, infection, possible need for blood transfusion and others were discussed and she agreed to proceed with the plan of care

## 2018-12-24 ENCOUNTER — Inpatient Hospital Stay: Payer: Medicare Other

## 2018-12-24 ENCOUNTER — Encounter (HOSPITAL_COMMUNITY): Payer: Self-pay | Admitting: Hematology and Oncology

## 2018-12-24 ENCOUNTER — Inpatient Hospital Stay: Payer: Medicare Other | Attending: Hematology and Oncology | Admitting: Hematology and Oncology

## 2018-12-24 ENCOUNTER — Encounter: Payer: Self-pay | Admitting: Hematology and Oncology

## 2018-12-24 ENCOUNTER — Other Ambulatory Visit: Payer: Self-pay

## 2018-12-24 VITALS — BP 147/64 | HR 77 | Temp 98.5°F | Resp 18 | Ht <= 58 in | Wt 108.2 lb

## 2018-12-24 DIAGNOSIS — Z9221 Personal history of antineoplastic chemotherapy: Secondary | ICD-10-CM | POA: Diagnosis not present

## 2018-12-24 DIAGNOSIS — C3491 Malignant neoplasm of unspecified part of right bronchus or lung: Secondary | ICD-10-CM

## 2018-12-24 DIAGNOSIS — K5730 Diverticulosis of large intestine without perforation or abscess without bleeding: Secondary | ICD-10-CM | POA: Diagnosis not present

## 2018-12-24 DIAGNOSIS — Z923 Personal history of irradiation: Secondary | ICD-10-CM | POA: Diagnosis not present

## 2018-12-24 DIAGNOSIS — K8020 Calculus of gallbladder without cholecystitis without obstruction: Secondary | ICD-10-CM | POA: Diagnosis not present

## 2018-12-24 DIAGNOSIS — C3431 Malignant neoplasm of lower lobe, right bronchus or lung: Secondary | ICD-10-CM | POA: Insufficient documentation

## 2018-12-24 DIAGNOSIS — I10 Essential (primary) hypertension: Secondary | ICD-10-CM | POA: Diagnosis not present

## 2018-12-24 DIAGNOSIS — I700 Atherosclerosis of aorta: Secondary | ICD-10-CM | POA: Insufficient documentation

## 2018-12-24 DIAGNOSIS — Z5111 Encounter for antineoplastic chemotherapy: Secondary | ICD-10-CM | POA: Insufficient documentation

## 2018-12-24 DIAGNOSIS — Z885 Allergy status to narcotic agent status: Secondary | ICD-10-CM | POA: Diagnosis not present

## 2018-12-24 DIAGNOSIS — C3490 Malignant neoplasm of unspecified part of unspecified bronchus or lung: Secondary | ICD-10-CM | POA: Diagnosis not present

## 2018-12-24 DIAGNOSIS — I2510 Atherosclerotic heart disease of native coronary artery without angina pectoris: Secondary | ICD-10-CM | POA: Diagnosis not present

## 2018-12-24 DIAGNOSIS — Z79899 Other long term (current) drug therapy: Secondary | ICD-10-CM | POA: Insufficient documentation

## 2018-12-24 DIAGNOSIS — M5136 Other intervertebral disc degeneration, lumbar region: Secondary | ICD-10-CM | POA: Diagnosis not present

## 2018-12-24 DIAGNOSIS — J984 Other disorders of lung: Secondary | ICD-10-CM | POA: Diagnosis not present

## 2018-12-24 DIAGNOSIS — Z171 Estrogen receptor negative status [ER-]: Secondary | ICD-10-CM | POA: Diagnosis not present

## 2018-12-24 DIAGNOSIS — C549 Malignant neoplasm of corpus uteri, unspecified: Secondary | ICD-10-CM

## 2018-12-24 DIAGNOSIS — C541 Malignant neoplasm of endometrium: Secondary | ICD-10-CM | POA: Diagnosis not present

## 2018-12-24 DIAGNOSIS — C7951 Secondary malignant neoplasm of bone: Secondary | ICD-10-CM | POA: Insufficient documentation

## 2018-12-24 DIAGNOSIS — J90 Pleural effusion, not elsewhere classified: Secondary | ICD-10-CM | POA: Insufficient documentation

## 2018-12-24 MED ORDER — ONDANSETRON HCL 8 MG PO TABS: 8.0000 mg | ORAL_TABLET | Freq: Three times a day (TID) | ORAL | 1 refills | Status: DC | PRN

## 2018-12-24 MED ORDER — FOLIC ACID 1 MG PO TABS: 1.0000 mg | ORAL_TABLET | Freq: Every day | ORAL | 3 refills | Status: DC

## 2018-12-24 MED ORDER — PROCHLORPERAZINE MALEATE 10 MG PO TABS: 10.0000 mg | ORAL_TABLET | Freq: Four times a day (QID) | ORAL | 1 refills | Status: DC | PRN

## 2018-12-24 NOTE — Progress Notes (Signed)
[place] OFFICE PROGRESS NOTE  Patient Care Team: Shon Baton, MD as PCP - General (Internal Medicine) Heath Lark, MD as Consulting Physician (Hematology and Oncology) Heath Lark, MD as Consulting Physician (Hematology and Oncology) Nancy Marus, MD as Attending Physician (Gynecologic Oncology)  ASSESSMENT & PLAN:  Primary lung cancer, right [facility]) I have reviewed the final pathology report with the patient and discussed the role of adjuvant treatment, intent of treatment is of curative We reviewed the current guidelines I would like to get a baseline MRI of the brain for staging. I recommend 4 cycles of carboplatinum and pemetrexed for treatment I plan to start her on treatment within 4 weeks of surgery I recommend chemo education class today We discussed the use of vitamin P82 and folic acid supplement to prevent anemia I will see her in 2 weeks at the start of treatment and will review imaging study with her I have sent messages to radiation oncologist to coordinate future appointment  The risks, benefits, side effects of carboplatin and pemetrexed were discussed including risk of allergic reaction, nausea, pancytopenia, infection, possible need for blood transfusion and others were discussed and she agreed to proceed with the plan of care   Endometrial cancer [facility]) She has history of endometrial cancer with no evidence of recurrence I will coordinate appointment with radiation oncologist to reschedule her MRI of her lumbar spine   Orders Placed This Encounter  Procedures  . MR Brain W Wo Contrast    Standing Status:   Future    Standing Expiration Date:   12/24/2019    Order Specific Question:   If indicated for the ordered procedure, I authorize the administration of contrast media per Radiology protocol    Answer:   Yes    Order Specific Question:   What is the patient's sedation requirement?    Answer:   Anti-anxiety    Order Specific Question:   Does  the patient have a pacemaker or implanted devices?    Answer:   No    Order Specific Question:   Use SRS Protocol?    Answer:   No    Order Specific Question:   Radiology Contrast Protocol - do NOT remove file path    Answer:   [email or web address]    Order Specific Question:   Preferred imaging location?    Answer:   [facility] (table limit-350 lbs)  . CBC with Differential (Cancer Center Only)    Standing Status:   Standing    Number of Occurrences:   20    Standing Expiration Date:   12/24/2019  . CMP ([place] only)    Standing Status:   Standing    Number of Occurrences:   20    Standing Expiration Date:   12/24/2019    INTERVAL HISTORY: Please see below for problem oriented charting. She returns today to review plan of care and discussed role of adjuvant treatment Her husband, [name], was also available over the telephone Overall, she is healing well from surgery She has minimum pain Her appetite is stable although she have lost some weight since surgery Denies recent cough or shortness of breath  SUMMARY OF ONCOLOGIC HISTORY: Oncology History Overview Note  Biopsy of recurrence 06-2014 ER negative. PDL1 testing low at 2% HER 2 negative on pathology [id number] Negative genetic testing   Malignant neoplasm of corpus uteri, except isthmus ([place])  12/22/2007 Surgery   Staging for IA UPSC    - 05/28/2008 Chemotherapy  6 cycles of paclitaxel and carboplatin with vaginal cuff brachytherapy   03/22/2011 Relapse/Recurrence   Left pelvic node recurrence   04/25/2011 Surgery   L/S evaluation, not resectable.   05/16/2011 - 09/25/2011 Chemotherapy   6 cyclces paclitaxel and carboplatin    - 12/27/2011 Radiation Therapy   radiation completed   03/10/2013 Progression   progression left pelvic mass   04/02/2013 Surgery   Resection and IORT   06/13/2014 Relapse/Recurrence   soft tissue mass at L3-4   07/11/2014 - 08/12/2014 Radiation Therapy    IMRT + zometa   02/12/2016 Progression      - 01/18/2016 Chemotherapy   The patient had ondansetron (ZOFRAN) IVPB 16 mg, 16 mg (100 % of original dose 16 mg), Intravenous,  Once, 5 of 5 cycles Dose modification: 16 mg (original dose 16 mg, Cycle 2)  CARBOplatin (PARAPLATIN) 390 mg in sodium chloride 0.9 % 100 mL chemo infusion, 390 mg (100 % of original dose 392 mg), Intravenous,  Once, 5 of 5 cycles Dose modification: 392 mg (original dose 392 mg, Cycle 1), 446.5 mg (original dose 392 mg, Cycle 2), 390 mg (original dose 392 mg, Cycle 2), 342 mg (original dose 392 mg, Cycle 6)  CARBOplatin chemo intradermal Test Dose 100 mcg, 0.1 mg, Intradermal,  Once, 5 of 5 cycles  PACLitaxel (TAXOL) 252 mg in dextrose 5 % 250 mL chemo infusion (> 9m/m2), 175 mg/m2 = 252 mg, Intravenous,  Once, 6 of 6 cycles Dose modification: 135 mg/m2 (original dose 175 mg/m2, Cycle 5)  CARBOplatin (PARAPLATIN) 250 mg in sodium chloride 0.9 % 100 mL chemo infusion, 250 mg (100 % of original dose 254.4 mg), Intravenous,  Once, 6 of 6 cycles Dose modification:   (original dose 254.4 mg, Cycle 1)  PACLitaxel (TAXOL) 114 mg in dextrose 5 % 250 mL chemo infusion ( ondansetron (ZOFRAN) 16 mg, dexamethasone (DECADRON) 20 mg in sodium chloride 0.9 % 50 mL IVPB, , Intravenous,  Once, 6 of 6 cycles  for chemotherapy treatment.     01/07/2019 -  Chemotherapy   The patient had palonosetron (ALOXI) injection 0.25 mg, 0.25 mg, Intravenous,  Once, 0 of 4 cycles PEMEtrexed (ALIMTA) 700 mg in sodium chloride 0.9 % 100 mL chemo infusion, 500 mg/m2 = 700 mg, Intravenous,  Once, 0 of 4 cycles CARBOplatin (PARAPLATIN) 300 mg in sodium chloride 0.9 % 100 mL chemo infusion, 300 mg (100 % of original dose 304.5 mg), Intravenous,  Once, 0 of 4 cycles Dose modification:   (original dose 304.5 mg, Cycle 1) fosaprepitant (EMEND) 150 mg, dexamethasone (DECADRON) 12 mg in sodium chloride 0.9 % 145 mL IVPB, , Intravenous,  Once, 0 of 4 cycles   for chemotherapy treatment.    [place]  12[place] Initial Diagnosis   She has recurrent papillary serous endometrial carcinoma. Briefly she was diagnosed in 2009 with a FIGO IB pappilary serous carcinoma treated with hysterectomy followed by adjuvant chemo and vaginal brachytherapy. She then had a recurrence diagnosed in late 2012 that was deemed not to be surgically resectable, and was treated with 6 cycles of carbo/taxol followed by 5040 cGy of EBRT to the pelvic mass. She was then followed with serial imaging and was noted in June of this year to have slight increase in the size of the mass, and again in September there was small enlargement of the mass. She had a re-staging PET scan on 03/10/13 that showed FDG activity in the pelvic mass, with no other areas of disease  12/01/2007 Imaging   CT scan of chest, abdomen and pelvis: 1.  Mass like area of decreased attenuation in the central uterus, consistent with the known endometrial carcinoma.  Deep myometrial invasion is suspected.  Pelvic MRI without and with contrast could be performed for further staging workup if clinically warranted. 2.  No evidence of extrauterine extension or lymphadenopathy. 3.  Sigmoid diverticulosis incidentally noted.   12/15/2007 Surgery   --12/2007 laparoscopic hysterectomy and PLND --05/2008 complted 6 cycles adjuvant carbo/taxol followed by vaginal cuff brachy --03/2011 exploratory lararoscopy, ureterolysis --4/13 completed 6 cycels carbotaxol --8/13 completed EBRT to the left pelvic sidewall    06/10/2008 Imaging   CT scan abdomen and pelvis 1.  Nonspecific mildly prominent inguinal lymph nodes, right greater than left. 2.  Interval hysterectomy without evidence of recurrent pelvic mass or fluid collection.   03/22/2011 Imaging   Ct scan abdomen and pelvis 1.  Local uterine cancer recurrence with two large nodes along the left pelvic sidewall. 2.  No evidence of bowel obstruction, urinary  obstruction, or more distant metastasis.   03/31/2011 Relapse/Recurrence   chemotherapy and external beam radiation   05/16/2011 - 09/17/2011 Chemotherapy   She had 6 cycles of carboplatin and Taxol    07/17/2011 Imaging   Ct scan abdomen and pelvis 1.  Interval improvement in the previously demonstrated left pelvic local recurrence of tumor. 2.  No disease progression or complication identified. 3.  Mild bladder wall thickening on the right, nonspecific and possibly related to incomplete distension and/or radiation therapy. 4.  Cholelithiasis   10/11/2011 Imaging   CT scan abdomen and pelvis 1.  Interval enlargement of the left pelvic sidewall masses. 2.  No new nodular disease in the pelvis or lymphadenopathy. 3.  No evidence  of distant metastasis. 4.  Mild hydronephrosis on the right is similar to prior. 5.  Focal thickening of the right aspect the bladder is stable compared to prior.    10/28/2011 - 12/05/2011 Radiation Therapy   radiation for pelvic sidewall recurrence   10/28/2011 - 12/05/2011 Radiation Therapy   10/28/11-12/05/11: Radiotherapy to the left pelvic sidewall   03/18/2012 Imaging   CT abdomen 1.  Today's study demonstrates a positive response to therapy with decreased size of left pelvic sidewall nodal masses, as detailed above.  No new soft tissue masses or new lymphadenopathy is identified within the abdomen or pelvis. Continue attention on follow-up studies is recommended. 2.  Cholelithiasis without findings to suggest acute cholecystitis. 3.  Colonic diverticulosis without findings to suggest acute diverticulitis at this time. 4.  Mild asymmetric urinary bladder wall thickening is unchanged compared to prior examinations and is nonspecific.  No definite bladder wall mass is identified at this time. 5.  Additional findings, similar to prior examinations, as above   06/22/2012 Imaging   CT abdomen 1.  Further decreased size of the left pelvic peripherally enhancing  lesion. 2.  No new sites of new or progressive disease. 3. Esophageal air fluid level suggests dysmotility or gastroesophageal reflux. 4.  Cholelithiasis   11/02/2012 Imaging   CT abdomen 1.  The left pelvic side wall lesion demonstrates mild increase in size from previous exam. 2.  No new sites of new or progressive disease. 3.  Cholelithiasis.    01/28/2013 Imaging   CT abdomen 1. Interval small increase in volume of enhancing mass adjacent to the left pelvic sidewall. 2. No evidence of abdominal or pelvic lymphadenopathy   04/02/2013 Surgery   pelvic mass resection with IORT  04/02/2013 - 04/02/2013 Radiation Therapy   04/02/13: Intraoperative radiotherapy to the left pelvis   05/10/2013 PET scan   1. Hypermetabolic mass in the deep left pelvis consistent with metastasis. 2. No additional evidence of local metastasis. No evidence of distant metastasis. 3. Small right lower lobe pulmonary nodule is not hypermetabolic.   07/27/2013 Imaging   No evidence of recurrent or metastatic disease. Apparent prior resection of the deep left pelvic metastasis.   02/08/2014 Imaging   No CT findings for recurrent or metastatic disease involving the abdomen/ pelvis   06/13/2014 Relapse/Recurrence   + recurrence paraspinous muscle   06/21/2014 Procedure   There is a soft tissue lesion along the left side of L3-L4. This lesion roughly measures up to 2.2 cm. Needle was positioned along the posterior aspect of the lesion. Small amount of air in the paraspinal tissues following needle removal   06/21/2014 Pathology Results   Bone, biopsy, left lumbar paraspinal - METASTATIC POORLY DIFFERENTIATED CARCINOMA, CONSISTENT WITH HIGH GRADE SEROUS CARCINOMA SEE COMMENT. Microscopic Comment The carcinoma demonstrates the following immunophenotype: Cytokeratin 7 - patchy moderate to strong expression Estrogen receptor - negative expression P53 - strong diffuse expression TTF-1 - negative expression WT-1  - negative expression CD56 - focal moderate strong expression Synaptophysin - negative expression GCDFP - negative expression The history of primary endometrial papillary serous carcinoma and primary mammary carcinoma is noted. In the current case, the overall morphologic and immunophenotype are that of poorly differentiated carcinoma, consistent with high grade serous carcinoma.    07/11/2014 - 08/12/2014 Radiation Therapy   07/11/14-08/12/14: 55 Gy in 25 fractions to the Lumbar spine   08/15/2014 Imaging   CT scan of chest, abdomen and pelvis 1. Since the biopsy study of 06/21/2014, similar to slight decrease in size of a left paraspinous lesion at L3-4. 2. No new sites of metastatic disease identified. 3. 8 mm ground-glass nodule in the right lower lobe is grossly similar to 03/10/2013, suggesting a benign etiology. 4. Cholelithiasis. 5. Apparent sigmoid colonic wall thickening which could be due to underdistention. Colitis felt less likely. 6.  Atherosclerosis, including within the coronary arteries. 7. Pelvic floor laxity.   10/07/2014 Imaging   CT scan of chest, abdomen and pelvis: Stable to slight interval decrease in size of left paraspinous lesion at L3-4. No new sites of metastatic disease. Unchanged ground-glass nodule within the right lower lobe, potentially benign in etiology. Recommend attention on followup. Cholelithiasis. Sigmoid colonic diverticulosis. No CT evidence to suggest acute diverticulitis.   01/19/2015 Imaging   Ct scan of abdomen and pelvis 1. Decrease in size of left paraspinous metastasis. 2. No new sites of disease. 3. Stable ground-glass attenuating nodule within the superior segment of right lower lobe. 4. Gallstones   05/02/2015 Imaging   Ct abdomen and pelvis: Slight interval increase in size of left lumbar paraspinal soft tissue metastasis. No new sites of metastatic disease identified within the abdomen or pelvis.    05/11/2015 PET scan   1. The small  soft tissue mass just lateral to the left L3 for neural foramen is hypermetabolic, favoring malignancy. 2. There is also a small focus of hypermetabolic activity between the left L1 and L2 transverse processes, but without a CT correlate. 3. The 13 mm sub solid nodule in the superior segment right lower lobe is stable from recent exams but has slowly increased in size over the last 7 years. Although not hypermetabolic, the appearance is concerning for the possibility of a low grade adenocarcinoma.  4. Other imaging findings of potential clinical significance: Chronic bilateral maxillary sinusitis. Coronary, aortic arch, and branch vessel atherosclerotic vascular disease. Aortoiliac atherosclerotic vascular disease. Cholelithiasis. Sigmoid colon diverticulosis. Lumbar scoliosis.   07/11/2015 Imaging   Ct scan of chest, abdomen and pelvis: 1. 13 mm mixed solid and sub solid nodule in the posterior right lower lobe was present in 2009 and has clearly progressed in the interval since that study. Imaging features remain highly concerning for low-grade or well differentiated adenocarcinoma. 2. Slight increase size of in the hypermetabolic, rim enhancing nodule identified adjacent to the left L3-4 neural foramen. Metastatic disease remains a concern. 3. No evidence for discrete soft tissue lesion between the left L1 and 2 transverse processes, the site of focal FDG uptake on the recent PET-CT. 4. Cholelithiasis. 5. Abdominal aortic atherosclerosis   08/03/2015 - 01/16/2016 Chemotherapy   She received carboplatin and Taxol   11/06/2015 PET scan   1. Hypermetabolic left V6-9 paraspinous metastasis (biopsy-proven) is stable in size and slightly decreased in metabolism. 2. Previously described small focus of hypermetabolism between the left L2 and L3 spinous processes has resolved. 3. New mild linear hypermetabolism to the left of the T11-12 spinous processes without discrete mass on the CT images, favor benign  activity related activity, recommend attention on follow-up PET-CT.  4. No definite new sites of hypermetabolic metastatic disease. No recurrent hypermetabolic metastatic disease in the pelvis. 5. Interval stability of subsolid 1.3 cm superior segment right lower lobe pulmonary nodule without associated significant metabolism, which has grown compared to the 2009 chest CT study, and remain suspicious for low grade adenocarcinoma. 6. Additional findings include aortic atherosclerosis, coronary atherosclerosis, mild multinodular goiter with no hypermetabolic thyroid nodules, cholelithiasis and moderate sigmoid diverticulosis.   02/12/2016 PET scan   1. Interval increase in size and metabolic activity of the LEFT paraspinal soft tissue metastasis. 2. New activity within the musculature of the LEFT chest wall. Given the unusual location of the paraspinal metastasis cannot exclude a second soft tissue metastasis to the musculature however favor benign physiologic activity. 3. Stable RIGHT lower lobe pulmonary nodule. 4. No evidence of local recurrence.     04/08/2016 Imaging   Ct scan of chest, abdomen and pelvis: 1. Similar size of a  left paravertebral abdominal lesion. 2. No new or progressive metastatic disease. 3. No correlate for the muscular activity about the lateral left chest wall. 4.  Coronary artery atherosclerosis. Aortic atherosclerosis. 5. Cholelithiasis. 6. Similar right lower lobe pulmonary nodule.   06/03/2016 Imaging   CT abdomen and pelvis 1. Similar to mild enlargement of a paravertebral soft tissue lesion at the L3-4 level. 2. No new sites of disease identified. 3. Cholelithiasis. 4. Hysterectomy. 5.  Aortic atherosclerosis.    09/02/2016 Imaging   CT abdomen and pelvis 1. Continue mild interval increase in size of LEFT paraspinal mass (1-2 mm). 2. No evidence of new metastatic disease in the abdomen pelvis. 3. Post hysterectomy anatomy   09/26/2016 Imaging   MR  lumbar spine 1. Left paraspinal metastasis with epicenter adjacent to the left L3 neural foramen does extend into the foramen to the level of the left lateral epidural space (series 9, image 31), but also tracks cephalad and caudal along the L2-L4 lumbar plexus (series 10, image 15). No extension into the left L2 or L4 neural foramina. And no more distal extension of tumor. 2. Abnormal signal in the medial left psoas and ventral left erector spinae muscles at L2 and L3 is probably  denervation related. 3. No bone invasion or osseous metastatic disease. Suspect previous radiation of the L2 through L5 spinal levels. 4. No dural or intradural metastatic disease.    10/22/2016 Procedure   She underwent stereotactic Radiosurgery   10/30/2016 Genetic Testing   Patient has genetic testing done for Inheritable genetic mutation panel. Results revealed patient has no actionable mutation.   01/21/2017 Imaging   1. Reduced size and conspicuity of the left paraspinal mass at the L3-4 level. The mass still present but has enhancement similar to that of adjacent psoas musculature. 2. No new metastatic disease is identified. 3. Other imaging findings of potential clinical significance: Cholelithiasis. Aortic Atherosclerosis (ICD10-I70.0). Sigmoid diverticulosis. Lumbar scoliosis.   05/09/2017 Imaging   Ct scan of abdomen and pelvis 1. Paraspinal mass adjacent to the left side of L3-L4 is less distinct than prior examinations, and is therefore difficult to discretely measure, however, the overall appearance suggests continued positive response to therapy. 2. No new signs of metastatic disease elsewhere in the abdomen or pelvis. 3. Aortic atherosclerosis, in addition to at least right coronary artery disease. Assessment for potential risk factor modification, dietary therapy or pharmacologic therapy may be warranted, if clinically indicated. 4. Colonic diverticulosis without evidence of acute diverticulitis at  this time. 5. Additional incidental findings, as above. Aortic Atherosclerosis (ICD10-I70.0).   08/14/2017 Imaging   1. Treated left paraspinal mass centered at L3-4. Mass size is stable but there has been some evolution of tumor characteristics with less central fluid seen today. Recommend continued surveillance. 2. No evidence of untreated metastasis. 3. Degenerative changes and related impingement are described above.   10/28/2017 Imaging   Stable small left paraspinal soft tissue mass. No new or progressive disease within the abdomen or pelvis.  Cholelithiasis.  No radiographic evidence of cholecystitis.  Colonic diverticulosis, without radiographic evidence of diverticulitis.   04/30/2018 Imaging   Stable post treatment change of the partially necrotic LEFT paravertebral mass at L3-L4. 18 x 21 mm cross-section. Mass effect on the LEFT L3 nerve root redemonstrated. Enhancing and atrophic LEFT psoas is inseparable.   10/15/2018 Imaging   1. Stable post treatment size and appearance of partially necrotic left paravertebral mass at L3-4, measuring 19 x 25 mm on current exam (unchanged when measured at similar level on previous study). Mass effect on the adjacent left L3 nerve root is unchanged. Adjacent enhancing and atrophic left psoas muscle. 2. Left convex scoliosis with associated multilevel degenerative spondylolysis and facet arthrosis, unchanged   10/29/2018 Imaging   Stable small left L3 paraspinal soft tissue mass. No evidence of new or progressive metastatic disease, or other acute findings.   Cholelithiasis.  No radiographic evidence of cholecystitis.   Colonic diverticulosis, without radiographic evidence of diverticulitis.   11/11/2018 PET scan   1. Low level metabolism (max SUV 2.0) associated with the irregular subsolid superior segment right lower lobe 2.1 cm pulmonary nodule. As better depicted on the recent diagnostic chest CT study, this nodule crosses the major fissure  into the right upper lobe and has increased in size and density on multiple imaging studies back to 2009. Findings are most suggestive of primary bronchogenic adenocarcinoma. 2. No hypermetabolic thoracic adenopathy. 3. Persistent hypermetabolism (max SUV 8.0) associated with the known left L3-4 paraspinous metastasis, mildly decreased in metabolism since 02/12/2016 PET-CT. 4. No additional sites of hypermetabolic metastatic disease in the abdomen or pelvis. No metabolic evidence of peritoneal recurrence. No ascites. 5. Chronic findings include: Aortic Atherosclerosis (ICD10-I70.0). Coronary atherosclerosis. Chronic bilateral  maxillary sinusitis. Cholelithiasis. Moderate sigmoid diverticulosis.   01/07/2019 -  Chemotherapy   The patient had palonosetron (ALOXI) injection 0.25 mg, 0.25 mg, Intravenous,  Once, 0 of 4 cycles PEMEtrexed (ALIMTA) 700 mg in sodium chloride 0.9 % 100 mL chemo infusion, 500 mg/m2 = 700 mg, Intravenous,  Once, 0 of 4 cycles CARBOplatin (PARAPLATIN) 300 mg in sodium chloride 0.9 % 100 mL chemo infusion, 300 mg (100 % of original dose 304.5 mg), Intravenous,  Once, 0 of 4 cycles Dose modification:   (original dose 304.5 mg, Cycle 1) fosaprepitant (EMEND) 150 mg, dexamethasone (DECADRON) 12 mg in sodium chloride 0.9 % 145 mL IVPB, , Intravenous,  Once, 0 of 4 cycles  for chemotherapy treatment.    Metastatic cancer to bone ([place])  06/27/2014 Initial Diagnosis   Metastatic cancer to bone   Primary lung cancer, right ([place])  11/04/2018 Initial Diagnosis   Primary lung cancer, right ([place])   11/11/2018 PET scan   1. Low level metabolism (max SUV 2.0) associated with the irregular subsolid superior segment right lower lobe 2.1 cm pulmonary nodule. As better depicted on the recent diagnostic chest CT study, this nodule crosses the major fissure into the right upper lobe and has increased in size and density on multiple imaging studies back to 2009. Findings are most suggestive of  primary bronchogenic adenocarcinoma. 2. No hypermetabolic thoracic adenopathy. 3. Persistent hypermetabolism (max SUV 8.0) associated with the known left L3-4 paraspinous metastasis, mildly decreased in metabolism since 02/12/2016 PET-CT. 4. No additional sites of hypermetabolic metastatic disease in the abdomen or pelvis. No metabolic evidence of peritoneal recurrence. No ascites. 5. Chronic findings include: Aortic Atherosclerosis (ICD10-I70.0). Coronary atherosclerosis. Chronic bilateral maxillary sinusitis. Cholelithiasis. Moderate sigmoid diverticulosis.   12/07/2018 Pathology Results   1. Lung, resection (segmental or lobe), Right Lobe Superior with endblock wedge of right upper lobe - INVASIVE ADENOCARCINOMA, MODERATELY DIFFERENTIATED, SPANNING 1.6 CM. - THE SURGICAL RESECTION MARGINS ARE NEGATIVE FOR CARCINOMA. - SEE ONCOLOGY TABLE BELOW. 2. Lymph node, biopsy, Level 9 #1 - THERE IS NO EVIDENCE OF CARCINOMA IN 1 OF 1 LYMPH NODE (0/1). 3. Lymph node, biopsy, Level 9 #2 - THERE IS NO EVIDENCE OF CARCINOMA IN 1 OF 1 LYMPH NODE (0/1). 4. Lymph node, biopsy, Level 7 #1 - METASTATIC CARCINOMA IN 1 OF 1 LYMPH NODE (1/1). 5. Lymph node, biopsy, Level 7 #2 - THERE IS NO EVIDENCE OF CARCINOMA IN 1 OF 1 LYMPH NODE (0/1). 6. Lymph node, biopsy, Level 12 #1 - THERE IS NO EVIDENCE OF CARCINOMA IN 1 OF 1 LYMPH NODE (0/1). 7. Lymph node, biopsy, Level 12 #2 - THERE IS NO EVIDENCE OF CARCINOMA IN 1 OF 1 LYMPH NODE (0/1). 8. Lymph node, biopsy, Level 10 #1 - THERE IS NO EVIDENCE OF CARCINOMA IN 1 OF 1 LYMPH NODE (0/1). 9. Lymph node, biopsy, Level 4R #1 - THERE IS NO EVIDENCE OF CARCINOMA IN 1 OF 1 LYMPH NODE (0/1). 10. Lymph node, biopsy, Level 4R #2 - THERE IS NO EVIDENCE OF CARCINOMA IN 1 OF 1 LYMPH NODE (0/1). 11. Lymph node, biopsy, Level 2R #1 - THERE IS NO EVIDENCE OF CARCINOMA IN 1 OF 1 LYMPH NODE (0/1). 12. Lung, resection (segmental or lobe), Right Lobe Superior - BENIGN LUNG  PARENCHYMA. - THERE IS NO EVIDENCE OF MALIGNANCY. Procedure: Segmentectomy. Specimen Laterality: Right. Tumor Site: Right superior lobe. Tumor Size: 1.6 cm (gross measurement). Tumor Focality: Unifocal. Histologic Type: Adenocarcinoma, moderately differentiated. Visceral Pleura Invasion: Not identified. Lymphovascular Invasion: Not identified.  Direct Invasion of Adjacent Structures: Not identified. Margins: Negative for carcinoma. Treatment Effect: N/A Regional Lymph Nodes: Number of Lymph Nodes Involved: 1 (level 7) Number of Lymph Nodes Examined: 10 Pathologic Stage Classification (pTNM, AJCC 8th Edition): pT1b, pN2 Ancillary Studies: The tumor cells are positive for Napsin-A, TTF-1, and p53. They are essentially negative for estrogen receptor, PAX-8 and progesterone receptor. Additional studies can be performed upon clinician request. Representative Tumor Block: 1D-1E. (JBK:gt, 12/09/18)   12/07/2018 Surgery   PREOPERATIVE DIAGNOSIS:  Right lung nodule.   POSTOPERATIVE DIAGNOSIS:  Non-small cell carcinoma- suspected primary lung carcinoma, clinical stage T2N01b.   PROCEDURE:   Right video-assisted thoracoscopy, Right lower lobe superior segmentectomy with en bloc wedge resection of right upper lobe, Lymph node dissection, Intercostal nerve block levels 3-9.   SURGEON:  Modesto Charon, MD   FINDINGS:  Mass palpable in posterior aspect of superior segment of right lower lobe, likely involvement across fissure into the upper lobe.  Frozen section revealed non-small cell carcinoma. upper and lower lobe margins clear.   12/23/2018 Cancer Staging   Staging form: Lung, AJCC 8th Edition - Pathologic: Stage IIIA (pT1b, pN2, cM0) - Signed by Heath Lark, MD on 12/23/2018     REVIEW OF SYSTEMS:   Constitutional: Denies fevers, chills Eyes: Denies blurriness of vision Ears, nose, mouth, throat, and face: Denies mucositis or sore throat Respiratory: Denies cough, dyspnea or  wheezes Cardiovascular: Denies palpitation, chest discomfort or lower extremity swelling Gastrointestinal:  Denies nausea, heartburn or change in bowel habits Skin: Denies abnormal skin rashes Lymphatics: Denies new lymphadenopathy or easy bruising Neurological:Denies numbness, tingling or new weaknesses Behavioral/Psych: Mood is stable, no new changes  All other systems were reviewed with the patient and are negative.  I have reviewed the past medical history, past surgical history, social history and family history with the patient and they are unchanged from previous note.  ALLERGIES:  is allergic to ace inhibitors; codeine; demerol  [meperidine hcl]; and dilaudid [hydromorphone hcl].  MEDICATIONS:  Current Outpatient Medications  Medication Sig Dispense Refill  . amLODipine (NORVASC) 5 MG tablet Take 5 mg by mouth daily.    Marland Kitchen atenolol (TENORMIN) 25 MG tablet Take 25 mg by mouth 2 (two) times daily.     . Biotin 5000 MCG TABS Take 5,000 mcg by mouth daily.    . folic acid (FOLVITE) 1 MG tablet Take 1 tablet (1 mg total) by mouth daily. Start 5-7 days before Alimta chemotherapy. Continue until 21 days after Alimta completed. 100 tablet 3  . lidocaine-prilocaine (EMLA) cream Apply to [facility] cath 1-2 hours prior to access as directed (Patient not taking: Reported on 12/01/2018) 30 g 1  . LORazepam (ATIVAN) 0.5 MG tablet 1 tablet po 30 minutes prior to radiation or MRI 30 tablet 0  . Magnesium 250 MG TABS Take 250 mg by mouth daily.     . Multiple Vitamin (MULTIVITAMIN WITH MINERALS) TABS tablet Take 1 tablet by mouth daily.    . ondansetron (ZOFRAN) 8 MG tablet Take 1 tablet (8 mg total) by mouth every 8 (eight) hours as needed (Nausea or vomiting). Start if needed on the third day after chemotherapy. 30 tablet 1  . potassium gluconate 595 MG TABS tablet Take 595 mg by mouth daily.     . prochlorperazine (COMPAZINE) 10 MG tablet Take 1 tablet (10 mg total) by mouth every 6 (six) hours as  needed (Nausea or vomiting). 30 tablet 1  . simvastatin (ZOCOR) 20 MG tablet Take 20  mg by mouth every evening.     . traMADol (ULTRAM) 50 MG tablet Take 1 tablet (50 mg total) by mouth every 6 (six) hours as needed (mild pain). 30 tablet 0  . TURMERIC PO Take 1 capsule by mouth daily.     No current facility-administered medications for this visit.    Facility-Administered Medications Ordered in Other Visits  Medication Dose Route Frequency Provider Last Rate Last Dose  . heparin lock flush 100 unit/mL  500 Units Intracatheter Once Alvy Bimler, Joylynn Defrancesco, MD      . sodium chloride flush (NS) 0.9 % injection 10 mL  10 mL Intracatheter Once Alvy Bimler, Lyndall Windt, MD        PHYSICAL EXAMINATION: ECOG PERFORMANCE STATUS: 1 - Symptomatic but completely ambulatory  Vitals:   12/24/18 0934  BP: (!) 147/64  Pulse: 77  Resp: 18  Temp: 98.5 F (36.9 C)  SpO2: 100%   Filed Weights   12/24/18 0934  Weight: 108 lb 3.2 oz (49.1 kg)    GENERAL:alert, no distress and comfortable SKIN: skin color, texture, turgor are normal, no rashes or significant lesions EYES: normal, Conjunctiva are pink and non-injected, sclera clear OROPHARYNX:no exudate, no erythema and lips, buccal mucosa, and tongue normal  NECK: supple, thyroid normal size, non-tender, without nodularity LYMPH:  no palpable lymphadenopathy in the cervical, axillary or inguinal LUNGS: clear to auscultation and percussion with normal breathing effort.  Noted well-healed surgical scar HEART: regular rate & rhythm and no murmurs and no lower extremity edema ABDOMEN:abdomen soft, non-tender and normal bowel sounds Musculoskeletal:no cyanosis of digits and no clubbing  NEURO: alert & oriented x 3 with fluent speech, no focal motor/sensory deficits  LABORATORY DATA:  I have reviewed the data as listed    Component Value Date/Time   NA 140 12/09/2018 0347   NA 140 05/09/2017 0844   K 4.0 12/09/2018 0347   K 4.1 05/09/2017 0844   CL 108 12/09/2018 0347    CO2 24 12/09/2018 0347   CO2 26 05/09/2017 0844   GLUCOSE 116 (H) 12/09/2018 0347   GLUCOSE 82 05/09/2017 0844   BUN 17 12/09/2018 0347   BUN 20.7 05/09/2017 0844   CREATININE 0.77 12/09/2018 0347   CREATININE 0.82 09/30/2018 0932   CREATININE 0.7 05/09/2017 0844   CALCIUM 8.8 (L) 12/09/2018 0347   CALCIUM 9.3 05/09/2017 0844   PROT 5.8 (L) 12/09/2018 0347   PROT 6.8 05/09/2017 0844   ALBUMIN 3.2 (L) 12/09/2018 0347   ALBUMIN 3.6 05/09/2017 0844   AST 27 12/09/2018 0347   AST 27 05/09/2017 0844   ALT 11 12/09/2018 0347   ALT 17 05/09/2017 0844   ALKPHOS 36 (L) 12/09/2018 0347   ALKPHOS 43 05/09/2017 0844   BILITOT 0.9 12/09/2018 0347   BILITOT 0.81 05/09/2017 0844   GFRNONAA >60 12/09/2018 0347   GFRNONAA >60 09/30/2018 0932   GFRAA >60 12/09/2018 0347   GFRAA >60 09/30/2018 0932    No results found for: SPEP, UPEP  Lab Results  Component Value Date   WBC 11.5 (H) 12/09/2018   NEUTROABS 3.6 11/04/2018   HGB 11.0 (L) 12/09/2018   HCT 34.3 (L) 12/09/2018   MCV 100.0 12/09/2018   PLT 155 12/09/2018      Chemistry      Component Value Date/Time   NA 140 12/09/2018 0347   NA 140 05/09/2017 0844   K 4.0 12/09/2018 0347   K 4.1 05/09/2017 0844   CL 108 12/09/2018 0347   CO2 24 12/09/2018  0347   CO2 26 05/09/2017 0844   BUN 17 12/09/2018 0347   BUN 20.7 05/09/2017 0844   CREATININE 0.77 12/09/2018 0347   CREATININE 0.82 09/30/2018 0932   CREATININE 0.7 05/09/2017 0844      Component Value Date/Time   CALCIUM 8.8 (L) 12/09/2018 0347   CALCIUM 9.3 05/09/2017 0844   ALKPHOS 36 (L) 12/09/2018 0347   ALKPHOS 43 05/09/2017 0844   AST 27 12/09/2018 0347   AST 27 05/09/2017 0844   ALT 11 12/09/2018 0347   ALT 17 05/09/2017 0844   BILITOT 0.9 12/09/2018 0347   BILITOT 0.81 05/09/2017 0844       RADIOGRAPHIC STUDIES: I have personally reviewed the radiological images as listed and agreed with the findings in the report. Dg Chest 2 View  Result Date:  12/21/2018 CLINICAL DATA:  Lung cancer EXAM: CHEST - 2 VIEW COMPARISON:  December 11, 2018 FINDINGS: The left-sided Port-A-Cath is unchanged in positioning. There is a small to moderate-sized right-sided pleural effusion which appears to have increased in size from prior study. There is an opacity in the right mid lung zone which is similar to prior study. The lungs are somewhat hyperexpanded. There are emphysematous changes bilaterally. There may be a trace residual right apical pneumothorax. Surgical clips project over the patient's right chest wall. There is no acute osseous abnormality. IMPRESSION: 1. Probable trace residual right apical pneumothorax. 2. Small to moderate size right-sided pleural effusion. 3. Stable appearance of the right mid lung zone. 4. Stable positioning of the left-sided Port-A-Cath. These results will be called to the ordering clinician or representative by the Radiologist Assistant, and communication documented in the PACS or zVision Dashboard. Electronically Signed   By: Constance Holster M.D.   On: 12/21/2018 15:07   Dg Chest 2 View  Result Date: 12/11/2018 CLINICAL DATA:  Follow-up history of lung surgery. EXAM: CHEST - 2 VIEW COMPARISON:  Chest radiograph 12/10/2018 FINDINGS: Left anterior chest wall Port-A-Cath is present with tip projecting over the superior vena cava. Monitoring leads overlie the patient. Stable cardiac and mediastinal contours. Similar-appearing consolidation right lower hemithorax. Mild interval decrease in size of right apical pneumothorax. No pleural effusion. Thoracic spine degenerative changes. IMPRESSION: Mild interval decrease in size of right apical pneumothorax. Electronically Signed   By: Lovey Newcomer M.D.   On: 12/11/2018 09:13   Dg Chest 2 View  Result Date: 12/10/2018 CLINICAL DATA:  Pneumothorax on the right EXAM: CHEST - 2 VIEW COMPARISON:  Yesterday FINDINGS: Mild increase in the right pneumothorax at the apex, now seen below the fourth rib.  Increased right perihilar atelectasis. Hyperinflation. Heart size is normal. Aortic tortuosity. Unremarkable porta catheter on the left. IMPRESSION: Mild increase in the small right apical pneumothorax. Electronically Signed   By: Monte Fantasia M.D.   On: 12/10/2018 09:19   Dg Chest 2 View  Result Date: 12/07/2018 CLINICAL DATA:  Preop chest x-ray EXAM: CHEST - 2 VIEW COMPARISON:  12/03/2018 FINDINGS: There is no edema, consolidation, effusion, or pneumothorax. Normal heart size and mediastinal contours. Porta catheter on the left with tip at the SVC. Postoperative right axilla. Advanced lumbar degenerative disease with scoliosis. IMPRESSION: No evidence of active disease. Electronically Signed   By: Monte Fantasia M.D.   On: 12/07/2018 06:46   Dg Chest 2 View  Result Date: 12/03/2018 CLINICAL DATA:  Preoperative study prior to lung surgery. EXAM: CHEST - 2 VIEW COMPARISON:  CT scan November 03, 2018 and chest x-ray April 08, 2013 FINDINGS:  The known right-sided nodules not appreciated on this chest x-ray. A left Port-A-Cath terminates in the central SVC. No pneumothorax. Surgical clips are seen in the right axilla. The cardiomediastinal silhouette is stable. No other changes. IMPRESSION: No acute abnormalities identified. The patient's known right lower lobe nodule is not visualized on this study. Electronically Signed   By: Dorise Bullion III M.D   On: 12/03/2018 13:59   Dg Chest 1v Repeat Same Day  Result Date: 12/09/2018 CLINICAL DATA:  Chest tube removal EXAM: CHEST - 1 VIEW SAME DAY COMPARISON:  12/09/2018 FINDINGS: Interval removal of the right-sided chest tube. Small right apical pneumothorax measuring less than 10%. Right basilar atelectasis. Stable cardiomediastinal silhouette. Left-sided Port-A-Cath in satisfactory position. IMPRESSION: Interval removal of the right-sided chest tube. Small right apical pneumothorax measuring less than 10%. Electronically Signed   By: Kathreen Devoid   On:  12/09/2018 13:04   Dg Chest Port 1 View  Result Date: 12/09/2018 CLINICAL DATA:  Patient status post right thoracoscopy 12/07/2018 4 right lower lobe superior segmentectomy and wedge resection of the right upper lobe. EXAM: PORTABLE CHEST 1 VIEW COMPARISON:  Single-view of the chest 12/08/2018 and 12/07/2018. FINDINGS: Port-A-Cath and right chest tube remain in place. Small right apical pneumothorax estimated at 10% is new since yesterday's examination. The left lung is expanded and clear. Volume loss in the right chest and mild right basilar atelectasis noted. Heart size is enlarged. Aortic atherosclerosis. IMPRESSION: Small right pneumothorax estimated at 10% is new since yesterday's examination. No other change. Electronically Signed   By: Inge Rise M.D.   On: 12/09/2018 08:38   Dg Chest Port 1 View  Result Date: 12/08/2018 CLINICAL DATA:  Chest tube present S/ P lung surg,,sore chest today EXAM: PORTABLE CHEST 1 VIEW COMPARISON:  12/07/2018 FINDINGS: Patient has a LEFT-sided power port, tip to the superior vena cava. RIGHT-sided chest tube is unchanged in position. No pneumothorax. Stable elevation of the RIGHT hemidiaphragm. Heart size is normal and accentuated by technique. Mild atelectasis at both lung bases. No edema. IMPRESSION: 1. Bibasilar atelectasis. 2. No pneumothorax. Electronically Signed   By: Nolon Nations M.D.   On: 12/08/2018 09:23   Dg Chest Port 1 View  Result Date: 12/07/2018 CLINICAL DATA:  Status post VATS. EXAM: PORTABLE CHEST 1 VIEW COMPARISON:  Prior study same day. FINDINGS: Chest tube noted with tip over the right apex. Tiny right lateral pneumothorax cannot be excluded. PowerPort catheter stable position. Surgical clips right chest. Mild bibasilar atelectasis. Stable cardiomegaly. Mild right chest wall subcutaneous emphysema. IMPRESSION: Interim placement of right chest tube. Tiny right lateral pneumothorax cannot be excluded. Mild right chest wall subcutaneous  emphysema. Electronically Signed   By: Marcello Moores  Register   On: 12/07/2018 12:29    All questions were answered. The patient knows to call the clinic with any problems, questions or concerns. No barriers to learning was detected.  I spent 30 minutes counseling the patient face to face. The total time spent in the appointment was 40 minutes and more than 50% was on counseling and review of test results  Heath Lark, MD 12/24/2018 10:46 AM

## 2018-12-24 NOTE — Assessment & Plan Note (Signed)
She has history of endometrial cancer with no evidence of recurrence I will coordinate appointment with radiation oncologist to reschedule her MRI of her lumbar spine

## 2018-12-24 NOTE — Progress Notes (Signed)
START ON PATHWAY REGIMEN - Non-Small Cell Lung     A cycle is every 21 days:     Pemetrexed      Carboplatin   **Always confirm dose/schedule in your pharmacy ordering system**  Patient Characteristics: Stage IIB - III - Resected (Adjuvant), No Prior Chemotherapy, Nonsquamous Cell AJCC T Category: T1 Current Disease Status: No Distant Mets or Local Recurrence AJCC N Category: N2 AJCC M Category: M0 AJCC 8 Stage Grouping: Unknown Histology: Nonsquamous Cell Intent of Therapy: Curative Intent, Discussed with Patient

## 2018-12-25 ENCOUNTER — Telehealth: Payer: Self-pay | Admitting: Hematology and Oncology

## 2018-12-25 NOTE — Telephone Encounter (Signed)
I talk with patient regarding schedule  

## 2018-12-29 ENCOUNTER — Ambulatory Visit: Payer: Medicare Other

## 2018-12-29 ENCOUNTER — Telehealth: Payer: Self-pay | Admitting: *Deleted

## 2018-12-29 ENCOUNTER — Other Ambulatory Visit: Payer: Self-pay | Admitting: Hematology and Oncology

## 2018-12-29 DIAGNOSIS — C541 Malignant neoplasm of endometrium: Secondary | ICD-10-CM

## 2018-12-29 DIAGNOSIS — R300 Dysuria: Secondary | ICD-10-CM

## 2018-12-29 NOTE — Telephone Encounter (Signed)
Patient called with concerns about urinary frequency day and throughout the night. No pain or burning. She would like to find out about coming in for a UA, culture.

## 2018-12-29 NOTE — Telephone Encounter (Signed)
She can come in tomorrow; I will order UA and Cx

## 2018-12-30 ENCOUNTER — Inpatient Hospital Stay: Payer: Medicare Other

## 2018-12-30 ENCOUNTER — Other Ambulatory Visit: Payer: Self-pay

## 2018-12-30 DIAGNOSIS — Z5111 Encounter for antineoplastic chemotherapy: Secondary | ICD-10-CM | POA: Diagnosis not present

## 2018-12-30 DIAGNOSIS — C549 Malignant neoplasm of corpus uteri, unspecified: Secondary | ICD-10-CM

## 2018-12-30 DIAGNOSIS — C7951 Secondary malignant neoplasm of bone: Secondary | ICD-10-CM | POA: Diagnosis not present

## 2018-12-30 DIAGNOSIS — M5136 Other intervertebral disc degeneration, lumbar region: Secondary | ICD-10-CM | POA: Diagnosis not present

## 2018-12-30 DIAGNOSIS — J984 Other disorders of lung: Secondary | ICD-10-CM | POA: Diagnosis not present

## 2018-12-30 DIAGNOSIS — R300 Dysuria: Secondary | ICD-10-CM

## 2018-12-30 DIAGNOSIS — C541 Malignant neoplasm of endometrium: Secondary | ICD-10-CM | POA: Diagnosis not present

## 2018-12-30 DIAGNOSIS — C3431 Malignant neoplasm of lower lobe, right bronchus or lung: Secondary | ICD-10-CM | POA: Diagnosis not present

## 2018-12-30 LAB — URINALYSIS, COMPLETE (UACMP) WITH MICROSCOPIC
Bilirubin Urine: NEGATIVE
Glucose, UA: NEGATIVE mg/dL
Hgb urine dipstick: NEGATIVE
Ketones, ur: NEGATIVE mg/dL
Leukocytes,Ua: NEGATIVE
Nitrite: NEGATIVE
Protein, ur: NEGATIVE mg/dL
Specific Gravity, Urine: 1.004 — ABNORMAL LOW (ref 1.005–1.030)
pH: 6 (ref 5.0–8.0)

## 2018-12-30 LAB — CBC WITH DIFFERENTIAL (CANCER CENTER ONLY)
Abs Immature Granulocytes: 0.03 10*3/uL (ref 0.00–0.07)
Basophils Absolute: 0.1 10*3/uL (ref 0.0–0.1)
Basophils Relative: 1 %
Eosinophils Absolute: 0.5 10*3/uL (ref 0.0–0.5)
Eosinophils Relative: 6 %
HCT: 35.1 % — ABNORMAL LOW (ref 36.0–46.0)
Hemoglobin: 11.4 g/dL — ABNORMAL LOW (ref 12.0–15.0)
Immature Granulocytes: 0 %
Lymphocytes Relative: 13 %
Lymphs Abs: 1 10*3/uL (ref 0.7–4.0)
MCH: 31.6 pg (ref 26.0–34.0)
MCHC: 32.5 g/dL (ref 30.0–36.0)
MCV: 97.2 fL (ref 80.0–100.0)
Monocytes Absolute: 0.8 10*3/uL (ref 0.1–1.0)
Monocytes Relative: 11 %
Neutro Abs: 5.1 10*3/uL (ref 1.7–7.7)
Neutrophils Relative %: 69 %
Platelet Count: 235 10*3/uL (ref 150–400)
RBC: 3.61 MIL/uL — ABNORMAL LOW (ref 3.87–5.11)
RDW: 12.4 % (ref 11.5–15.5)
WBC Count: 7.4 10*3/uL (ref 4.0–10.5)
nRBC: 0 % (ref 0.0–0.2)

## 2018-12-30 LAB — CMP (CANCER CENTER ONLY)
ALT: 15 U/L (ref 0–44)
AST: 24 U/L (ref 15–41)
Albumin: 3.5 g/dL (ref 3.5–5.0)
Alkaline Phosphatase: 61 U/L (ref 38–126)
Anion gap: 10 (ref 5–15)
BUN: 22 mg/dL (ref 8–23)
CO2: 26 mmol/L (ref 22–32)
Calcium: 9.6 mg/dL (ref 8.9–10.3)
Chloride: 104 mmol/L (ref 98–111)
Creatinine: 0.8 mg/dL (ref 0.44–1.00)
GFR, Est AFR Am: >60 mL/min (ref >60)
GFR, Estimated: >60 mL/min (ref >60)
Glucose, Bld: 104 mg/dL — ABNORMAL HIGH (ref 70–99)
Potassium: 4 mmol/L (ref 3.5–5.1)
Sodium: 140 mmol/L (ref 135–145)
Total Bilirubin: 0.4 mg/dL (ref 0.3–1.2)
Total Protein: 7.2 g/dL (ref 6.5–8.1)

## 2018-12-30 NOTE — Telephone Encounter (Signed)
Patient would like to come in today for lab today. Message sent to schedulers.

## 2018-12-31 ENCOUNTER — Telehealth: Payer: Self-pay | Admitting: *Deleted

## 2018-12-31 LAB — URINE CULTURE: Culture: NO GROWTH

## 2018-12-31 NOTE — Telephone Encounter (Signed)
-----   Message from Heath Lark, MD sent at 12/31/2018  3:38 PM EDT ----- Regarding: urine culture is neg, pls let he rknow

## 2018-12-31 NOTE — Telephone Encounter (Signed)
Left message for return call.

## 2019-01-01 ENCOUNTER — Encounter (HOSPITAL_COMMUNITY): Payer: Self-pay | Admitting: Hematology and Oncology

## 2019-01-05 ENCOUNTER — Other Ambulatory Visit: Payer: Self-pay

## 2019-01-05 ENCOUNTER — Ambulatory Visit (HOSPITAL_COMMUNITY)
Admission: RE | Admit: 2019-01-05 | Discharge: 2019-01-05 | Disposition: A | Discharge status: Home / Self Care | Payer: Medicare Other | Source: Ambulatory Visit | Attending: Hematology and Oncology | Admitting: Hematology and Oncology

## 2019-01-05 DIAGNOSIS — C3490 Malignant neoplasm of unspecified part of unspecified bronchus or lung: Secondary | ICD-10-CM | POA: Diagnosis not present

## 2019-01-05 MED ORDER — GADOBUTROL 1 MMOL/ML IV SOLN
5.0000 mL | Freq: Once | INTRAVENOUS | Status: AC | PRN
  Administered 2019-01-05: 5 mL via INTRAVENOUS

## 2019-01-06 ENCOUNTER — Other Ambulatory Visit: Payer: Self-pay | Admitting: Hematology and Oncology

## 2019-01-07 ENCOUNTER — Inpatient Hospital Stay: Payer: Medicare Other

## 2019-01-07 ENCOUNTER — Inpatient Hospital Stay (HOSPITAL_BASED_OUTPATIENT_CLINIC_OR_DEPARTMENT_OTHER): Payer: Medicare Other | Admitting: Hematology and Oncology

## 2019-01-07 ENCOUNTER — Encounter: Payer: Self-pay | Admitting: Hematology and Oncology

## 2019-01-07 ENCOUNTER — Other Ambulatory Visit: Payer: Self-pay

## 2019-01-07 VITALS — BP 152/68 | HR 65 | Resp 18

## 2019-01-07 DIAGNOSIS — C541 Malignant neoplasm of endometrium: Secondary | ICD-10-CM | POA: Diagnosis not present

## 2019-01-07 DIAGNOSIS — C3491 Malignant neoplasm of unspecified part of right bronchus or lung: Secondary | ICD-10-CM | POA: Diagnosis not present

## 2019-01-07 DIAGNOSIS — C3431 Malignant neoplasm of lower lobe, right bronchus or lung: Secondary | ICD-10-CM | POA: Diagnosis not present

## 2019-01-07 DIAGNOSIS — I10 Essential (primary) hypertension: Secondary | ICD-10-CM

## 2019-01-07 DIAGNOSIS — M5136 Other intervertebral disc degeneration, lumbar region: Secondary | ICD-10-CM | POA: Diagnosis not present

## 2019-01-07 DIAGNOSIS — D701 Agranulocytosis secondary to cancer chemotherapy: Secondary | ICD-10-CM

## 2019-01-07 DIAGNOSIS — C7951 Secondary malignant neoplasm of bone: Secondary | ICD-10-CM

## 2019-01-07 DIAGNOSIS — Z95828 Presence of other vascular implants and grafts: Secondary | ICD-10-CM

## 2019-01-07 DIAGNOSIS — J984 Other disorders of lung: Secondary | ICD-10-CM | POA: Diagnosis not present

## 2019-01-07 DIAGNOSIS — Z853 Personal history of malignant neoplasm of breast: Secondary | ICD-10-CM

## 2019-01-07 DIAGNOSIS — Z5111 Encounter for antineoplastic chemotherapy: Secondary | ICD-10-CM | POA: Diagnosis not present

## 2019-01-07 DIAGNOSIS — C549 Malignant neoplasm of corpus uteri, unspecified: Secondary | ICD-10-CM

## 2019-01-07 LAB — COMPREHENSIVE METABOLIC PANEL
ALT: 14 U/L (ref 0–44)
AST: 24 U/L (ref 15–41)
Albumin: 3.5 g/dL (ref 3.5–5.0)
Alkaline Phosphatase: 60 U/L (ref 38–126)
Anion gap: 9 (ref 5–15)
BUN: 21 mg/dL (ref 8–23)
CO2: 24 mmol/L (ref 22–32)
Calcium: 9.5 mg/dL (ref 8.9–10.3)
Chloride: 108 mmol/L (ref 98–111)
Creatinine, Ser: 0.79 mg/dL (ref 0.44–1.00)
GFR calc Af Amer: >60 mL/min (ref >60)
GFR calc non Af Amer: >60 mL/min (ref >60)
Glucose, Bld: 112 mg/dL — ABNORMAL HIGH (ref 70–99)
Potassium: 3.8 mmol/L (ref 3.5–5.1)
Sodium: 141 mmol/L (ref 135–145)
Total Bilirubin: 0.6 mg/dL (ref 0.3–1.2)
Total Protein: 6.9 g/dL (ref 6.5–8.1)

## 2019-01-07 LAB — CBC WITH DIFFERENTIAL/PLATELET
Abs Immature Granulocytes: 0.04 10*3/uL (ref 0.00–0.07)
Basophils Absolute: 0.1 10*3/uL (ref 0.0–0.1)
Basophils Relative: 1 %
Eosinophils Absolute: 0.5 10*3/uL (ref 0.0–0.5)
Eosinophils Relative: 6 %
HCT: 34.1 % — ABNORMAL LOW (ref 36.0–46.0)
Hemoglobin: 11.1 g/dL — ABNORMAL LOW (ref 12.0–15.0)
Immature Granulocytes: 1 %
Lymphocytes Relative: 19 %
Lymphs Abs: 1.4 10*3/uL (ref 0.7–4.0)
MCH: 30.4 pg (ref 26.0–34.0)
MCHC: 32.6 g/dL (ref 30.0–36.0)
MCV: 93.4 fL (ref 80.0–100.0)
Monocytes Absolute: 0.8 10*3/uL (ref 0.1–1.0)
Monocytes Relative: 11 %
Neutro Abs: 4.7 10*3/uL (ref 1.7–7.7)
Neutrophils Relative %: 62 %
Platelets: 218 10*3/uL (ref 150–400)
RBC: 3.65 MIL/uL — ABNORMAL LOW (ref 3.87–5.11)
RDW: 12.2 % (ref 11.5–15.5)
WBC: 7.4 10*3/uL (ref 4.0–10.5)
nRBC: 0 % (ref 0.0–0.2)

## 2019-01-07 MED ORDER — DIPHENHYDRAMINE HCL 50 MG/ML IJ SOLN
INTRAMUSCULAR | Status: AC
  Filled 2019-01-07: qty 1

## 2019-01-07 MED ORDER — CYANOCOBALAMIN 1000 MCG/ML IJ SOLN
INTRAMUSCULAR | Status: AC
  Filled 2019-01-07: qty 1

## 2019-01-07 MED ORDER — SODIUM CHLORIDE 0.9% FLUSH
10.0000 mL | Freq: Once | INTRAVENOUS | Status: AC
  Administered 2019-01-07: 10 mL
  Filled 2019-01-07: qty 10

## 2019-01-07 MED ORDER — SODIUM CHLORIDE 0.9 % IV SOLN
304.5000 mg | Freq: Once | INTRAVENOUS | Status: AC
  Administered 2019-01-07: 300 mg via INTRAVENOUS
  Filled 2019-01-07: qty 30

## 2019-01-07 MED ORDER — FAMOTIDINE IN NACL 20-0.9 MG/50ML-% IV SOLN
20.0000 mg | Freq: Once | INTRAVENOUS | Status: AC
  Administered 2019-01-07: 20 mg via INTRAVENOUS

## 2019-01-07 MED ORDER — CYANOCOBALAMIN 1000 MCG/ML IJ SOLN
1000.0000 ug | Freq: Once | INTRAMUSCULAR | Status: AC
  Administered 2019-01-07: 1000 ug via INTRAMUSCULAR

## 2019-01-07 MED ORDER — SODIUM CHLORIDE 0.9 % IV SOLN
Freq: Once | INTRAVENOUS | Status: AC
  Administered 2019-01-07: 10:00:00 via INTRAVENOUS
  Filled 2019-01-07: qty 5

## 2019-01-07 MED ORDER — PALONOSETRON HCL INJECTION 0.25 MG/5ML
INTRAVENOUS | Status: AC
  Filled 2019-01-07: qty 5

## 2019-01-07 MED ORDER — SODIUM CHLORIDE 0.9% FLUSH
10.0000 mL | INTRAVENOUS | Status: DC | PRN
  Administered 2019-01-07: 10 mL
  Filled 2019-01-07: qty 10

## 2019-01-07 MED ORDER — DIPHENHYDRAMINE HCL 50 MG/ML IJ SOLN
25.0000 mg | Freq: Once | INTRAMUSCULAR | Status: AC
  Administered 2019-01-07: 25 mg via INTRAVENOUS

## 2019-01-07 MED ORDER — SODIUM CHLORIDE 0.9 % IV SOLN
Freq: Once | INTRAVENOUS | Status: AC
  Administered 2019-01-07: 09:00:00 via INTRAVENOUS
  Filled 2019-01-07: qty 250

## 2019-01-07 MED ORDER — FAMOTIDINE IN NACL 20-0.9 MG/50ML-% IV SOLN
INTRAVENOUS | Status: AC
  Filled 2019-01-07: qty 50

## 2019-01-07 MED ORDER — PALONOSETRON HCL INJECTION 0.25 MG/5ML
0.2500 mg | Freq: Once | INTRAVENOUS | Status: AC
  Administered 2019-01-07: 10:00:00 0.25 mg via INTRAVENOUS

## 2019-01-07 MED ORDER — HEPARIN SOD (PORK) LOCK FLUSH 100 UNIT/ML IV SOLN
500.0000 [IU] | Freq: Once | INTRAVENOUS | Status: AC | PRN
  Administered 2019-01-07: 500 [IU]
  Filled 2019-01-07: qty 5

## 2019-01-07 MED ORDER — SODIUM CHLORIDE 0.9 % IV SOLN
500.0000 mg/m2 | Freq: Once | INTRAVENOUS | Status: AC
  Administered 2019-01-07: 700 mg via INTRAVENOUS
  Filled 2019-01-07: qty 20

## 2019-01-07 NOTE — Assessment & Plan Note (Signed)
She has significant elevated blood pressure that she attributed to whitecoat hypertension She will continue close follow-up with primary care doctor

## 2019-01-07 NOTE — Patient Instructions (Signed)
[facility] Discharge Instructions for Patients Receiving Chemotherapy  Today you received the following chemotherapy agents Alimta and Carboplatin  To help prevent nausea and vomiting after your treatment, we encourage you to take your nausea medication as directed.   If you develop nausea and vomiting that is not controlled by your nausea medication, call the clinic.   BELOW ARE SYMPTOMS THAT SHOULD BE REPORTED IMMEDIATELY:  *FEVER GREATER THAN 100.5 F  *CHILLS WITH OR WITHOUT FEVER  NAUSEA AND VOMITING THAT IS NOT CONTROLLED WITH YOUR NAUSEA MEDICATION  *UNUSUAL SHORTNESS OF BREATH  *UNUSUAL BRUISING OR BLEEDING  TENDERNESS IN MOUTH AND THROAT WITH OR WITHOUT PRESENCE OF ULCERS  *URINARY PROBLEMS  *BOWEL PROBLEMS  UNUSUAL RASH Items with * indicate a potential emergency and should be followed up as soon as possible.  Feel free to call the clinic should you have any questions or concerns. The clinic phone number is [phone].  Please show the [facility] at check-in to the Emergency Department and triage nurse.   Pemetrexed injection (Alimta) What is this medicine? PEMETREXED (PEM e TREX ed) is a chemotherapy drug used to treat lung cancers like non-small cell lung cancer and mesothelioma. It may also be used to treat other cancers. This medicine may be used for other purposes; ask your health care provider or pharmacist if you have questions. COMMON BRAND NAME(S): Alimta What should I tell my health care provider before I take this medicine? They need to know if you have any of these conditions:  infection (especially a virus infection such as chickenpox, cold sores, or herpes)  kidney disease  low blood counts, like low white cell, platelet, or red cell counts  lung or breathing disease, like asthma  radiation therapy  an unusual or allergic reaction to pemetrexed, other medicines, foods, dyes, or preservative  pregnant or trying to  get pregnant  breast-feeding How should I use this medicine? This drug is given as an infusion into a vein. It is administered in a hospital or clinic by a specially trained health care professional. Talk to your pediatrician regarding the use of this medicine in children. Special care may be needed. Overdosage: If you think you have taken too much of this medicine contact a poison control center or emergency room at once. NOTE: This medicine is only for you. Do not share this medicine with others. What if I miss a dose? It is important not to miss your dose. Call your doctor or health care professional if you are unable to keep an appointment. What may interact with this medicine? This medicine may interact with the following medications:  Ibuprofen This list may not describe all possible interactions. Give your health care provider a list of all the medicines, herbs, non-prescription drugs, or dietary supplements you use. Also tell them if you smoke, drink alcohol, or use illegal drugs. Some items may interact with your medicine. What should I watch for while using this medicine? Visit your doctor for checks on your progress. This drug may make you feel generally unwell. This is not uncommon, as chemotherapy can affect healthy cells as well as cancer cells. Report any side effects. Continue your course of treatment even though you feel ill unless your doctor tells you to stop. In some cases, you may be given additional medicines to help with side effects. Follow all directions for their use. Call your doctor or health care professional for advice if you get a fever, chills or sore  throat, or other symptoms of a cold or flu. Do not treat yourself. This drug decreases your body's ability to fight infections. Try to avoid being around people who are sick. This medicine may increase your risk to bruise or bleed. Call your doctor or health care professional if you notice any unusual bleeding. Be  careful brushing and flossing your teeth or using a toothpick because you may get an infection or bleed more easily. If you have any dental work done, tell your dentist you are receiving this medicine. Avoid taking products that contain aspirin, acetaminophen, ibuprofen, naproxen, or ketoprofen unless instructed by your doctor. These medicines may hide a fever. Call your doctor or health care professional if you get diarrhea or mouth sores. Do not treat yourself. To protect your kidneys, drink water or other fluids as directed while you are taking this medicine. Do not become pregnant while taking this medicine or for 6 months after stopping it. Women should inform their doctor if they wish to become pregnant or think they might be pregnant. Men should not father a child while taking this medicine and for 3 months after stopping it. This may interfere with the ability to father a child. You should talk to your doctor or health care professional if you are concerned about your fertility. There is a potential for serious side effects to an unborn child. Talk to your health care professional or pharmacist for more information. Do not breast-feed an infant while taking this medicine or for 1 week after stopping it. What side effects may I notice from receiving this medicine? Side effects that you should report to your doctor or health care professional as soon as possible:  allergic reactions like skin rash, itching or hives, swelling of the face, lips, or tongue  breathing problems  redness, blistering, peeling or loosening of the skin, including inside the mouth  signs and symptoms of bleeding such as bloody or black, tarry stools; red or dark-brown urine; spitting up blood or brown material that looks like coffee grounds; red spots on the skin; unusual bruising or bleeding from the eye, gums, or nose  signs and symptoms of infection like fever or chills; cough; sore throat; pain or trouble passing  urine  signs and symptoms of kidney injury like trouble passing urine or change in the amount of urine  signs and symptoms of liver injury like dark yellow or brown urine; general ill feeling or flu-like symptoms; light-colored stools; loss of appetite; nausea; right upper belly pain; unusually weak or tired; yellowing of the eyes or skin Side effects that usually do not require medical attention (report to your doctor or health care professional if they continue or are bothersome):  constipation  mouth sores  nausea, vomiting  unusually weak or tired This list may not describe all possible side effects. Call your doctor for medical advice about side effects. You may report side effects to FDA at 1-800-FDA-1088. Where should I keep my medicine? This drug is given in a hospital or clinic and will not be stored at home. NOTE: This sheet is a summary. It may not cover all possible information. If you have questions about this medicine, talk to your doctor, pharmacist, or health care provider.  2020 Elsevier/Gold Standard (2017-06-18 16:11:33)   Carboplatin injection What is this medicine? CARBOPLATIN (KAR boe pla tin) is a chemotherapy drug. It targets fast dividing cells, like cancer cells, and causes these cells to die. This medicine is used to treat ovarian cancer  and many other cancers. This medicine may be used for other purposes; ask your health care provider or pharmacist if you have questions. COMMON BRAND NAME(S): Paraplatin What should I tell my health care provider before I take this medicine? They need to know if you have any of these conditions:  blood disorders  hearing problems  kidney disease  recent or ongoing radiation therapy  an unusual or allergic reaction to carboplatin, cisplatin, other chemotherapy, other medicines, foods, dyes, or preservatives  pregnant or trying to get pregnant  breast-feeding How should I use this medicine? This drug is usually  given as an infusion into a vein. It is administered in a hospital or clinic by a specially trained health care professional. Talk to your pediatrician regarding the use of this medicine in children. Special care may be needed. Overdosage: If you think you have taken too much of this medicine contact a poison control center or emergency room at once. NOTE: This medicine is only for you. Do not share this medicine with others. What if I miss a dose? It is important not to miss a dose. Call your doctor or health care professional if you are unable to keep an appointment. What may interact with this medicine?  medicines for seizures  medicines to increase blood counts like filgrastim, pegfilgrastim, sargramostim  some antibiotics like amikacin, gentamicin, neomycin, streptomycin, tobramycin  vaccines Talk to your doctor or health care professional before taking any of these medicines:  acetaminophen  aspirin  ibuprofen  ketoprofen  naproxen This list may not describe all possible interactions. Give your health care provider a list of all the medicines, herbs, non-prescription drugs, or dietary supplements you use. Also tell them if you smoke, drink alcohol, or use illegal drugs. Some items may interact with your medicine. What should I watch for while using this medicine? Your condition will be monitored carefully while you are receiving this medicine. You will need important blood work done while you are taking this medicine. This drug may make you feel generally unwell. This is not uncommon, as chemotherapy can affect healthy cells as well as cancer cells. Report any side effects. Continue your course of treatment even though you feel ill unless your doctor tells you to stop. In some cases, you may be given additional medicines to help with side effects. Follow all directions for their use. Call your doctor or health care professional for advice if you get a fever, chills or sore  throat, or other symptoms of a cold or flu. Do not treat yourself. This drug decreases your body's ability to fight infections. Try to avoid being around people who are sick. This medicine may increase your risk to bruise or bleed. Call your doctor or health care professional if you notice any unusual bleeding. Be careful brushing and flossing your teeth or using a toothpick because you may get an infection or bleed more easily. If you have any dental work done, tell your dentist you are receiving this medicine. Avoid taking products that contain aspirin, acetaminophen, ibuprofen, naproxen, or ketoprofen unless instructed by your doctor. These medicines may hide a fever. Do not become pregnant while taking this medicine. Women should inform their doctor if they wish to become pregnant or think they might be pregnant. There is a potential for serious side effects to an unborn child. Talk to your health care professional or pharmacist for more information. Do not breast-feed an infant while taking this medicine. What side effects may I notice from  receiving this medicine? Side effects that you should report to your doctor or health care professional as soon as possible:  allergic reactions like skin rash, itching or hives, swelling of the face, lips, or tongue  signs of infection - fever or chills, cough, sore throat, pain or difficulty passing urine  signs of decreased platelets or bleeding - bruising, pinpoint red spots on the skin, black, tarry stools, nosebleeds  signs of decreased red blood cells - unusually weak or tired, fainting spells, lightheadedness  breathing problems  changes in hearing  changes in vision  chest pain  high blood pressure  low blood counts - This drug may decrease the number of white blood cells, red blood cells and platelets. You may be at increased risk for infections and bleeding.  nausea and vomiting  pain, swelling, redness or irritation at the injection  site  pain, tingling, numbness in the hands or feet  problems with balance, talking, walking  trouble passing urine or change in the amount of urine Side effects that usually do not require medical attention (report to your doctor or health care professional if they continue or are bothersome):  hair loss  loss of appetite  metallic taste in the mouth or changes in taste This list may not describe all possible side effects. Call your doctor for medical advice about side effects. You may report side effects to FDA at 1-800-FDA-1088. Where should I keep my medicine? This drug is given in a hospital or clinic and will not be stored at home. NOTE: This sheet is a summary. It may not cover all possible information. If you have questions about this medicine, talk to your doctor, pharmacist, or health care provider.  2020 Elsevier/Gold Standard (2007-08-04 14:38:05)   Coronavirus (COVID-19) Are you at risk?  Are you at risk for the Coronavirus (COVID-19)?  To be considered HIGH RISK for Coronavirus (COVID-19), you have to meet the following criteria:  . Traveled to [place], [place], [place], [place] or [place]; or in the [place] to [place], [place], [place], or [place]; and have fever, cough, and shortness of breath within the last 2 weeks of travel OR . Been in close contact with a person diagnosed with COVID-19 within the last 2 weeks and have fever, cough, and shortness of breath . IF YOU DO NOT MEET THESE CRITERIA, YOU ARE CONSIDERED LOW RISK FOR COVID-19.  What to do if you are HIGH RISK for COVID-19?  Marland Kitchen If you are having a medical emergency, call 911. . Seek medical care right away. Before you go to a doctor's office, urgent care or emergency department, call ahead and tell them about your recent travel, contact with someone diagnosed with COVID-19, and your symptoms. You should receive instructions from your physician's office regarding next steps of care.  . When you  arrive at healthcare provider, tell the healthcare staff immediately you have returned from visiting [place], [place], [place], [place] or [place]; or traveled in the [place] to [place], [place], [place], or [place]; in the last two weeks or you have been in close contact with a person diagnosed with COVID-19 in the last 2 weeks.   . Tell the health care staff about your symptoms: fever, cough and shortness of breath. . After you have been seen by a medical provider, you will be either: o Tested for (COVID-19) and discharged home on quarantine except to seek medical care if symptoms worsen, and asked to  -  Stay home and avoid contact with others until you get your results (4-5 days)  - Avoid travel on public transportation if possible (such as bus, train, or airplane) or o Sent to the Emergency Department by EMS for evaluation, COVID-19 testing, and possible admission depending on your condition and test results.  What to do if you are LOW RISK for COVID-19?  Reduce your risk of any infection by using the same precautions used for avoiding the common cold or flu:  Marland Kitchen Wash your hands often with soap and warm water for at least 20 seconds.  If soap and water are not readily available, use an alcohol-based hand sanitizer with at least 60% alcohol.  . If coughing or sneezing, cover your mouth and nose by coughing or sneezing into the elbow areas of your shirt or coat, into a tissue or into your sleeve (not your hands). . Avoid shaking hands with others and consider head nods or verbal greetings only. . Avoid touching your eyes, nose, or mouth with unwashed hands.  . Avoid close contact with people who are sick. . Avoid places or events with large numbers of people in one location, like concerts or sporting events. . Carefully consider travel plans you have or are making. . If you are planning any travel outside or inside the [place], visit the CDC's Travelers' Health webpage for the latest health  notices. . If you have some symptoms but not all symptoms, continue to monitor at home and seek medical attention if your symptoms worsen. . If you are having a medical emergency, call 911.   [facility] / e-Visit: [email or web address]         [facility]: [facility]: [phone]                   [facility]: [phone]

## 2019-01-07 NOTE — Assessment & Plan Note (Signed)
I have reviewed MRI imaging with the patient She will proceed with 4 cycles of adjuvant treatment followed by repeat imaging study and then future consolidation radiation treatment Radiation oncologist team has been informed with the plan of care We reviewed again some of the risks, benefits, side effects of treatment and she is in agreement to proceed with therapy as prescribed I will see her prior to cycle 2 of treatment for toxicity review

## 2019-01-07 NOTE — Progress Notes (Signed)
[place] OFFICE PROGRESS NOTE  Patient Care Team: Shon Baton, MD as PCP - General (Internal Medicine) Heath Lark, MD as Consulting Physician (Hematology and Oncology) Heath Lark, MD as Consulting Physician (Hematology and Oncology) Nancy Marus, MD as Attending Physician (Gynecologic Oncology)  ASSESSMENT & PLAN:  Primary lung cancer, right [facility]) I have reviewed MRI imaging with the patient She will proceed with 4 cycles of adjuvant treatment followed by repeat imaging study and then future consolidation radiation treatment Radiation oncologist team has been informed with the plan of care We reviewed again some of the risks, benefits, side effects of treatment and she is in agreement to proceed with therapy as prescribed I will see her prior to cycle 2 of treatment for toxicity review  White coat syndrome with diagnosis of hypertension She has significant elevated blood pressure that she attributed to whitecoat hypertension She will continue close follow-up with primary care doctor   No orders of the defined types were placed in this encounter.   INTERVAL HISTORY: Please see below for problem oriented charting. She returns for further follow-up and to start chemotherapy Her chest wall wound has completely healed She denies recent symptoms of shortness of breath or cough  SUMMARY OF ONCOLOGIC HISTORY: Oncology History Overview Note  Biopsy of recurrence 06-2014 ER negative. PDL1 testing low at 2% on the uterine cancer HER 2 negative on pathology [id number] Negative genetic testing  On her lung cancer sample, [employer] [facility] testing revealed 10% PD-1 positive EGFR mutation was detected   Malignant neoplasm of corpus uteri, except isthmus ([place])  12/22/2007 Surgery   Staging for IA UPSC    - 05/28/2008 Chemotherapy   6 cycles of paclitaxel and carboplatin with vaginal cuff brachytherapy   03/22/2011 Relapse/Recurrence   Left pelvic node recurrence    04/25/2011 Surgery   L/S evaluation, not resectable.   05/16/2011 - 09/25/2011 Chemotherapy   6 cyclces paclitaxel and carboplatin    - 12/27/2011 Radiation Therapy   radiation completed   03/10/2013 Progression   progression left pelvic mass   04/02/2013 Surgery   Resection and IORT   06/13/2014 Relapse/Recurrence   soft tissue mass at L3-4   07/11/2014 - 08/12/2014 Radiation Therapy   IMRT + zometa   02/12/2016 Progression      - 01/18/2016 Chemotherapy   The patient had ondansetron (ZOFRAN) IVPB 16 mg, 16 mg (100 % of original dose 16 mg), Intravenous,  Once, 5 of 5 cycles Dose modification: 16 mg (original dose 16 mg, Cycle 2)  CARBOplatin (PARAPLATIN) 390 mg in sodium chloride 0.9 % 100 mL chemo infusion, 390 mg (100 % of original dose 392 mg), Intravenous,  Once, 5 of 5 cycles Dose modification: 392 mg (original dose 392 mg, Cycle 1), 446.5 mg (original dose 392 mg, Cycle 2), 390 mg (original dose 392 mg, Cycle 2), 342 mg (original dose 392 mg, Cycle 6)  CARBOplatin chemo intradermal Test Dose 100 mcg, 0.1 mg, Intradermal,  Once, 5 of 5 cycles  PACLitaxel (TAXOL) 252 mg in dextrose 5 % 250 mL chemo infusion (> '80mg'$ /m2), 175 mg/m2 = 252 mg, Intravenous,  Once, 6 of 6 cycles Dose modification: 135 mg/m2 (original dose 175 mg/m2, Cycle 5)  CARBOplatin (PARAPLATIN) 250 mg in sodium chloride 0.9 % 100 mL chemo infusion, 250 mg (100 % of original dose 254.4 mg), Intravenous,  Once, 6 of 6 cycles Dose modification:   (original dose 254.4 mg, Cycle 1)  PACLitaxel (TAXOL) 114 mg in dextrose 5 %  250 mL chemo infusion ( ondansetron (ZOFRAN) 16 mg, dexamethasone (DECADRON) 20 mg in sodium chloride 0.9 % 50 mL IVPB, , Intravenous,  Once, 6 of 6 cycles  for chemotherapy treatment.     01/07/2019 -  Chemotherapy   The patient had palonosetron (ALOXI) injection 0.25 mg, 0.25 mg, Intravenous,  Once, 1 of 4 cycles PEMEtrexed (ALIMTA) 700 mg in sodium chloride 0.9 % 100 mL chemo infusion, 500  mg/m2 = 700 mg, Intravenous,  Once, 1 of 4 cycles CARBOplatin (PARAPLATIN) 300 mg in sodium chloride 0.9 % 100 mL chemo infusion, 300 mg (100 % of original dose 304.5 mg), Intravenous,  Once, 1 of 4 cycles Dose modification:   (original dose 304.5 mg, Cycle 1) fosaprepitant (EMEND) 150 mg, dexamethasone (DECADRON) 12 mg in sodium chloride 0.9 % 145 mL IVPB, , Intravenous,  Once, 1 of 4 cycles  for chemotherapy treatment.    Endometrial cancer ([place])  12/01/2007 Initial Diagnosis   She has recurrent papillary serous endometrial carcinoma. Briefly she was diagnosed in 2009 with a FIGO IB pappilary serous carcinoma treated with hysterectomy followed by adjuvant chemo and vaginal brachytherapy. She then had a recurrence diagnosed in late 2012 that was deemed not to be surgically resectable, and was treated with 6 cycles of carbo/taxol followed by 5040 cGy of EBRT to the pelvic mass. She was then followed with serial imaging and was noted in June of this year to have slight increase in the size of the mass, and again in September there was small enlargement of the mass. She had a re-staging PET scan on 03/10/13 that showed FDG activity in the pelvic mass, with no other areas of disease   12/01/2007 Imaging   CT scan of chest, abdomen and pelvis: 1.  Mass like area of decreased attenuation in the central uterus, consistent with the known endometrial carcinoma.  Deep myometrial invasion is suspected.  Pelvic MRI without and with contrast could be performed for further staging workup if clinically warranted. 2.  No evidence of extrauterine extension or lymphadenopathy. 3.  Sigmoid diverticulosis incidentally noted.   12/15/2007 Surgery   --12/2007 laparoscopic hysterectomy and PLND --05/2008 complted 6 cycles adjuvant carbo/taxol followed by vaginal cuff brachy --03/2011 exploratory lararoscopy, ureterolysis --4/13 completed 6 cycels carbotaxol --8/13 completed EBRT to the left pelvic sidewall    06/10/2008  Imaging   CT scan abdomen and pelvis 1.  Nonspecific mildly prominent inguinal lymph nodes, right greater than left. 2.  Interval hysterectomy without evidence of recurrent pelvic mass or fluid collection.   03/22/2011 Imaging   Ct scan abdomen and pelvis 1.  Local uterine cancer recurrence with two large nodes along the left pelvic sidewall. 2.  No evidence of bowel obstruction, urinary obstruction, or more distant metastasis.   03/31/2011 Relapse/Recurrence   chemotherapy and external beam radiation   05/16/2011 - 09/17/2011 Chemotherapy   She had 6 cycles of carboplatin and Taxol    07/17/2011 Imaging   Ct scan abdomen and pelvis 1.  Interval improvement in the previously demonstrated left pelvic local recurrence of tumor. 2.  No disease progression or complication identified. 3.  Mild bladder wall thickening on the right, nonspecific and possibly related to incomplete distension and/or radiation therapy. 4.  Cholelithiasis   10/11/2011 Imaging   CT scan abdomen and pelvis 1.  Interval enlargement of the left pelvic sidewall masses. 2.  No new nodular disease in the pelvis or lymphadenopathy. 3.  No evidence  of distant metastasis. 4.  Mild hydronephrosis  on the right is similar to prior. 5.  Focal thickening of the right aspect the bladder is stable compared to prior.    10/28/2011 - 12/05/2011 Radiation Therapy   radiation for pelvic sidewall recurrence   10/28/2011 - 12/05/2011 Radiation Therapy   10/28/11-12/05/11: Radiotherapy to the left pelvic sidewall   03/18/2012 Imaging   CT abdomen 1.  Today's study demonstrates a positive response to therapy with decreased size of left pelvic sidewall nodal masses, as detailed above.  No new soft tissue masses or new lymphadenopathy is identified within the abdomen or pelvis. Continue attention on follow-up studies is recommended. 2.  Cholelithiasis without findings to suggest acute cholecystitis. 3.  Colonic diverticulosis without  findings to suggest acute diverticulitis at this time. 4.  Mild asymmetric urinary bladder wall thickening is unchanged compared to prior examinations and is nonspecific.  No definite bladder wall mass is identified at this time. 5.  Additional findings, similar to prior examinations, as above   06/22/2012 Imaging   CT abdomen 1.  Further decreased size of the left pelvic peripherally enhancing lesion. 2.  No new sites of new or progressive disease. 3. Esophageal air fluid level suggests dysmotility or gastroesophageal reflux. 4.  Cholelithiasis   11/02/2012 Imaging   CT abdomen 1.  The left pelvic side wall lesion demonstrates mild increase in size from previous exam. 2.  No new sites of new or progressive disease. 3.  Cholelithiasis.    01/28/2013 Imaging   CT abdomen 1. Interval small increase in volume of enhancing mass adjacent to the left pelvic sidewall. 2. No evidence of abdominal or pelvic lymphadenopathy   04/02/2013 Surgery   pelvic mass resection with IORT   04/02/2013 - 04/02/2013 Radiation Therapy   04/02/13: Intraoperative radiotherapy to the left pelvis   05/10/2013 PET scan   1. Hypermetabolic mass in the deep left pelvis consistent with metastasis. 2. No additional evidence of local metastasis. No evidence of distant metastasis. 3. Small right lower lobe pulmonary nodule is not hypermetabolic.   07/27/2013 Imaging   No evidence of recurrent or metastatic disease. Apparent prior resection of the deep left pelvic metastasis.   02/08/2014 Imaging   No CT findings for recurrent or metastatic disease involving the abdomen/ pelvis   06/13/2014 Relapse/Recurrence   + recurrence paraspinous muscle   06/21/2014 Procedure   There is a soft tissue lesion along the left side of L3-L4. This lesion roughly measures up to 2.2 cm. Needle was positioned along the posterior aspect of the lesion. Small amount of air in the paraspinal tissues following needle removal   06/21/2014  Pathology Results   Bone, biopsy, left lumbar paraspinal - METASTATIC POORLY DIFFERENTIATED CARCINOMA, CONSISTENT WITH HIGH GRADE SEROUS CARCINOMA SEE COMMENT. Microscopic Comment The carcinoma demonstrates the following immunophenotype: Cytokeratin 7 - patchy moderate to strong expression Estrogen receptor - negative expression P53 - strong diffuse expression TTF-1 - negative expression WT-1 - negative expression CD56 - focal moderate strong expression Synaptophysin - negative expression GCDFP - negative expression The history of primary endometrial papillary serous carcinoma and primary mammary carcinoma is noted. In the current case, the overall morphologic and immunophenotype are that of poorly differentiated carcinoma, consistent with high grade serous carcinoma.    07/11/2014 - 08/12/2014 Radiation Therapy   07/11/14-08/12/14: 55 Gy in 25 fractions to the Lumbar spine   08/15/2014 Imaging   CT scan of chest, abdomen and pelvis 1. Since the biopsy study of 06/21/2014, similar to slight decrease in size of a  left paraspinous lesion at L3-4. 2. No new sites of metastatic disease identified. 3. 8 mm ground-glass nodule in the right lower lobe is grossly similar to 03/10/2013, suggesting a benign etiology. 4. Cholelithiasis. 5. Apparent sigmoid colonic wall thickening which could be due to underdistention. Colitis felt less likely. 6.  Atherosclerosis, including within the coronary arteries. 7. Pelvic floor laxity.   10/07/2014 Imaging   CT scan of chest, abdomen and pelvis: Stable to slight interval decrease in size of left paraspinous lesion at L3-4. No new sites of metastatic disease. Unchanged ground-glass nodule within the right lower lobe, potentially benign in etiology. Recommend attention on followup. Cholelithiasis. Sigmoid colonic diverticulosis. No CT evidence to suggest acute diverticulitis.   01/19/2015 Imaging   Ct scan of abdomen and pelvis 1. Decrease in size of left  paraspinous metastasis. 2. No new sites of disease. 3. Stable ground-glass attenuating nodule within the superior segment of right lower lobe. 4. Gallstones   05/02/2015 Imaging   Ct abdomen and pelvis: Slight interval increase in size of left lumbar paraspinal soft tissue metastasis. No new sites of metastatic disease identified within the abdomen or pelvis.    05/11/2015 PET scan   1. The small soft tissue mass just lateral to the left L3 for neural foramen is hypermetabolic, favoring malignancy. 2. There is also a small focus of hypermetabolic activity between the left L1 and L2 transverse processes, but without a CT correlate. 3. The 13 mm sub solid nodule in the superior segment right lower lobe is stable from recent exams but has slowly increased in size over the last 7 years. Although not hypermetabolic, the appearance is concerning for the possibility of a low grade adenocarcinoma. 4. Other imaging findings of potential clinical significance: Chronic bilateral maxillary sinusitis. Coronary, aortic arch, and branch vessel atherosclerotic vascular disease. Aortoiliac atherosclerotic vascular disease. Cholelithiasis. Sigmoid colon diverticulosis. Lumbar scoliosis.   07/11/2015 Imaging   Ct scan of chest, abdomen and pelvis: 1. 13 mm mixed solid and sub solid nodule in the posterior right lower lobe was present in 2009 and has clearly progressed in the interval since that study. Imaging features remain highly concerning for low-grade or well differentiated adenocarcinoma. 2. Slight increase size of in the hypermetabolic, rim enhancing nodule identified adjacent to the left L3-4 neural foramen. Metastatic disease remains a concern. 3. No evidence for discrete soft tissue lesion between the left L1 and 2 transverse processes, the site of focal FDG uptake on the recent PET-CT. 4. Cholelithiasis. 5. Abdominal aortic atherosclerosis   08/03/2015 - 01/16/2016 Chemotherapy   She received  carboplatin and Taxol   11/06/2015 PET scan   1. Hypermetabolic left D4-2 paraspinous metastasis (biopsy-proven) is stable in size and slightly decreased in metabolism. 2. Previously described small focus of hypermetabolism between the left L2 and L3 spinous processes has resolved. 3. New mild linear hypermetabolism to the left of the T11-12 spinous processes without discrete mass on the CT images, favor benign activity related activity, recommend attention on follow-up PET-CT.  4. No definite new sites of hypermetabolic metastatic disease. No recurrent hypermetabolic metastatic disease in the pelvis. 5. Interval stability of subsolid 1.3 cm superior segment right lower lobe pulmonary nodule without associated significant metabolism, which has grown compared to the 2009 chest CT study, and remain suspicious for low grade adenocarcinoma. 6. Additional findings include aortic atherosclerosis, coronary atherosclerosis, mild multinodular goiter with no hypermetabolic thyroid nodules, cholelithiasis and moderate sigmoid diverticulosis.   02/12/2016 PET scan   1. Interval increase  in size and metabolic activity of the LEFT paraspinal soft tissue metastasis. 2. New activity within the musculature of the LEFT chest wall. Given the unusual location of the paraspinal metastasis cannot exclude a second soft tissue metastasis to the musculature however favor benign physiologic activity. 3. Stable RIGHT lower lobe pulmonary nodule. 4. No evidence of local recurrence.     04/08/2016 Imaging   Ct scan of chest, abdomen and pelvis: 1. Similar size of a  left paravertebral abdominal lesion. 2. No new or progressive metastatic disease. 3. No correlate for the muscular activity about the lateral left chest wall. 4.  Coronary artery atherosclerosis. Aortic atherosclerosis. 5. Cholelithiasis. 6. Similar right lower lobe pulmonary nodule.   06/03/2016 Imaging   CT abdomen and pelvis 1. Similar to mild  enlargement of a paravertebral soft tissue lesion at the L3-4 level. 2. No new sites of disease identified. 3. Cholelithiasis. 4. Hysterectomy. 5.  Aortic atherosclerosis.    09/02/2016 Imaging   CT abdomen and pelvis 1. Continue mild interval increase in size of LEFT paraspinal mass (1-2 mm). 2. No evidence of new metastatic disease in the abdomen pelvis. 3. Post hysterectomy anatomy   09/26/2016 Imaging   MR lumbar spine 1. Left paraspinal metastasis with epicenter adjacent to the left L3 neural foramen does extend into the foramen to the level of the left lateral epidural space (series 9, image 31), but also tracks cephalad and caudal along the L2-L4 lumbar plexus (series 10, image 15). No extension into the left L2 or L4 neural foramina. And no more distal extension of tumor. 2. Abnormal signal in the medial left psoas and ventral left erector spinae muscles at L2 and L3 is probably denervation related. 3. No bone invasion or osseous metastatic disease. Suspect previous radiation of the L2 through L5 spinal levels. 4. No dural or intradural metastatic disease.    10/22/2016 Procedure   She underwent stereotactic Radiosurgery   10/30/2016 Genetic Testing   Patient has genetic testing done for Inheritable genetic mutation panel. Results revealed patient has no actionable mutation.   01/21/2017 Imaging   1. Reduced size and conspicuity of the left paraspinal mass at the L3-4 level. The mass still present but has enhancement similar to that of adjacent psoas musculature. 2. No new metastatic disease is identified. 3. Other imaging findings of potential clinical significance: Cholelithiasis. Aortic Atherosclerosis (ICD10-I70.0). Sigmoid diverticulosis. Lumbar scoliosis.   05/09/2017 Imaging   Ct scan of abdomen and pelvis 1. Paraspinal mass adjacent to the left side of L3-L4 is less distinct than prior examinations, and is therefore difficult to discretely measure, however, the overall  appearance suggests continued positive response to therapy. 2. No new signs of metastatic disease elsewhere in the abdomen or pelvis. 3. Aortic atherosclerosis, in addition to at least right coronary artery disease. Assessment for potential risk factor modification, dietary therapy or pharmacologic therapy may be warranted, if clinically indicated. 4. Colonic diverticulosis without evidence of acute diverticulitis at this time. 5. Additional incidental findings, as above. Aortic Atherosclerosis (ICD10-I70.0).   08/14/2017 Imaging   1. Treated left paraspinal mass centered at L3-4. Mass size is stable but there has been some evolution of tumor characteristics with less central fluid seen today. Recommend continued surveillance. 2. No evidence of untreated metastasis. 3. Degenerative changes and related impingement are described above.   10/28/2017 Imaging   Stable small left paraspinal soft tissue mass. No new or progressive disease within the abdomen or pelvis.  Cholelithiasis.  No radiographic evidence of  cholecystitis.  Colonic diverticulosis, without radiographic evidence of diverticulitis.   04/30/2018 Imaging   Stable post treatment change of the partially necrotic LEFT paravertebral mass at L3-L4. 18 x 21 mm cross-section. Mass effect on the LEFT L3 nerve root redemonstrated. Enhancing and atrophic LEFT psoas is inseparable.   10/15/2018 Imaging   1. Stable post treatment size and appearance of partially necrotic left paravertebral mass at L3-4, measuring 19 x 25 mm on current exam (unchanged when measured at similar level on previous study). Mass effect on the adjacent left L3 nerve root is unchanged. Adjacent enhancing and atrophic left psoas muscle. 2. Left convex scoliosis with associated multilevel degenerative spondylolysis and facet arthrosis, unchanged   10/29/2018 Imaging   Stable small left L3 paraspinal soft tissue mass. No evidence of new or progressive metastatic disease, or  other acute findings.   Cholelithiasis.  No radiographic evidence of cholecystitis.   Colonic diverticulosis, without radiographic evidence of diverticulitis.   11/11/2018 PET scan   1. Low level metabolism (max SUV 2.0) associated with the irregular subsolid superior segment right lower lobe 2.1 cm pulmonary nodule. As better depicted on the recent diagnostic chest CT study, this nodule crosses the major fissure into the right upper lobe and has increased in size and density on multiple imaging studies back to 2009. Findings are most suggestive of primary bronchogenic adenocarcinoma. 2. No hypermetabolic thoracic adenopathy. 3. Persistent hypermetabolism (max SUV 8.0) associated with the known left L3-4 paraspinous metastasis, mildly decreased in metabolism since 02/12/2016 PET-CT. 4. No additional sites of hypermetabolic metastatic disease in the abdomen or pelvis. No metabolic evidence of peritoneal recurrence. No ascites. 5. Chronic findings include: Aortic Atherosclerosis (ICD10-I70.0). Coronary atherosclerosis. Chronic bilateral maxillary sinusitis. Cholelithiasis. Moderate sigmoid diverticulosis.   01/07/2019 -  Chemotherapy   The patient had palonosetron (ALOXI) injection 0.25 mg, 0.25 mg, Intravenous,  Once, 1 of 4 cycles PEMEtrexed (ALIMTA) 700 mg in sodium chloride 0.9 % 100 mL chemo infusion, 500 mg/m2 = 700 mg, Intravenous,  Once, 1 of 4 cycles CARBOplatin (PARAPLATIN) 300 mg in sodium chloride 0.9 % 100 mL chemo infusion, 300 mg (100 % of original dose 304.5 mg), Intravenous,  Once, 1 of 4 cycles Dose modification:   (original dose 304.5 mg, Cycle 1) fosaprepitant (EMEND) 150 mg, dexamethasone (DECADRON) 12 mg in sodium chloride 0.9 % 145 mL IVPB, , Intravenous,  Once, 1 of 4 cycles  for chemotherapy treatment.    Metastatic cancer to bone ([place])  06/27/2014 Initial Diagnosis   Metastatic cancer to bone   Primary lung cancer, right ([place])  11/04/2018 Initial Diagnosis   Primary  lung cancer, right ([place])   11/11/2018 PET scan   1. Low level metabolism (max SUV 2.0) associated with the irregular subsolid superior segment right lower lobe 2.1 cm pulmonary nodule. As better depicted on the recent diagnostic chest CT study, this nodule crosses the major fissure into the right upper lobe and has increased in size and density on multiple imaging studies back to 2009. Findings are most suggestive of primary bronchogenic adenocarcinoma. 2. No hypermetabolic thoracic adenopathy. 3. Persistent hypermetabolism (max SUV 8.0) associated with the known left L3-4 paraspinous metastasis, mildly decreased in metabolism since 02/12/2016 PET-CT. 4. No additional sites of hypermetabolic metastatic disease in the abdomen or pelvis. No metabolic evidence of peritoneal recurrence. No ascites. 5. Chronic findings include: Aortic Atherosclerosis (ICD10-I70.0). Coronary atherosclerosis. Chronic bilateral maxillary sinusitis. Cholelithiasis. Moderate sigmoid diverticulosis.   12/07/2018 Pathology Results   1. Lung, resection (segmental or lobe),  Right Lobe Superior with endblock wedge of right upper lobe - INVASIVE ADENOCARCINOMA, MODERATELY DIFFERENTIATED, SPANNING 1.6 CM. - THE SURGICAL RESECTION MARGINS ARE NEGATIVE FOR CARCINOMA. - SEE ONCOLOGY TABLE BELOW. 2. Lymph node, biopsy, Level 9 #1 - THERE IS NO EVIDENCE OF CARCINOMA IN 1 OF 1 LYMPH NODE (0/1). 3. Lymph node, biopsy, Level 9 #2 - THERE IS NO EVIDENCE OF CARCINOMA IN 1 OF 1 LYMPH NODE (0/1). 4. Lymph node, biopsy, Level 7 #1 - METASTATIC CARCINOMA IN 1 OF 1 LYMPH NODE (1/1). 5. Lymph node, biopsy, Level 7 #2 - THERE IS NO EVIDENCE OF CARCINOMA IN 1 OF 1 LYMPH NODE (0/1). 6. Lymph node, biopsy, Level 12 #1 - THERE IS NO EVIDENCE OF CARCINOMA IN 1 OF 1 LYMPH NODE (0/1). 7. Lymph node, biopsy, Level 12 #2 - THERE IS NO EVIDENCE OF CARCINOMA IN 1 OF 1 LYMPH NODE (0/1). 8. Lymph node, biopsy, Level 10 #1 - THERE IS NO EVIDENCE OF  CARCINOMA IN 1 OF 1 LYMPH NODE (0/1). 9. Lymph node, biopsy, Level 4R #1 - THERE IS NO EVIDENCE OF CARCINOMA IN 1 OF 1 LYMPH NODE (0/1). 10. Lymph node, biopsy, Level 4R #2 - THERE IS NO EVIDENCE OF CARCINOMA IN 1 OF 1 LYMPH NODE (0/1). 11. Lymph node, biopsy, Level 2R #1 - THERE IS NO EVIDENCE OF CARCINOMA IN 1 OF 1 LYMPH NODE (0/1). 12. Lung, resection (segmental or lobe), Right Lobe Superior - BENIGN LUNG PARENCHYMA. - THERE IS NO EVIDENCE OF MALIGNANCY. Procedure: Segmentectomy. Specimen Laterality: Right. Tumor Site: Right superior lobe. Tumor Size: 1.6 cm (gross measurement). Tumor Focality: Unifocal. Histologic Type: Adenocarcinoma, moderately differentiated. Visceral Pleura Invasion: Not identified. Lymphovascular Invasion: Not identified. Direct Invasion of Adjacent Structures: Not identified. Margins: Negative for carcinoma. Treatment Effect: N/A Regional Lymph Nodes: Number of Lymph Nodes Involved: 1 (level 7) Number of Lymph Nodes Examined: 10 Pathologic Stage Classification (pTNM, AJCC 8th Edition): pT1b, pN2 Ancillary Studies: The tumor cells are positive for Napsin-A, TTF-1, and p53. They are essentially negative for estrogen receptor, PAX-8 and progesterone receptor. Additional studies can be performed upon clinician request. Representative Tumor Block: 1D-1E. (JBK:gt, 12/09/18)   12/07/2018 Surgery   PREOPERATIVE DIAGNOSIS:  Right lung nodule.   POSTOPERATIVE DIAGNOSIS:  Non-small cell carcinoma- suspected primary lung carcinoma, clinical stage T2N01b.   PROCEDURE:   Right video-assisted thoracoscopy, Right lower lobe superior segmentectomy with en bloc wedge resection of right upper lobe, Lymph node dissection, Intercostal nerve block levels 3-9.   SURGEON:  Modesto Charon, MD   FINDINGS:  Mass palpable in posterior aspect of superior segment of right lower lobe, likely involvement across fissure into the upper lobe.  Frozen section revealed non-small cell  carcinoma. upper and lower lobe margins clear.   12/23/2018 Cancer Staging   Staging form: Lung, AJCC 8th Edition - Pathologic: Stage IIIA (pT1b, pN2, cM0) - Signed by Heath Lark, MD on 12/23/2018   01/05/2019 Imaging   MRI brain 1. No metastatic disease or acute intracranial abnormality identified. 2. Small chronic parafalcine meningioma size is stable since that described in 2006 (12 x 15 mm). 3. Advanced signal changes in the cerebral white matter and to a lesser extent pons, nonspecific but most commonly due to chronic small vessel disease. 4. Partially visible degenerative cervical spinal stenosis.       REVIEW OF SYSTEMS:   Constitutional: Denies fevers, chills or abnormal weight loss Eyes: Denies blurriness of vision Ears, nose, mouth, throat, and face: Denies mucositis or sore throat  Respiratory: Denies cough, dyspnea or wheezes Cardiovascular: Denies palpitation, chest discomfort or lower extremity swelling Gastrointestinal:  Denies nausea, heartburn or change in bowel habits Skin: Denies abnormal skin rashes Lymphatics: Denies new lymphadenopathy or easy bruising Neurological:Denies numbness, tingling or new weaknesses Behavioral/Psych: Mood is stable, no new changes  All other systems were reviewed with the patient and are negative.  I have reviewed the past medical history, past surgical history, social history and family history with the patient and they are unchanged from previous note.  ALLERGIES:  is allergic to ace inhibitors; codeine; demerol  [meperidine hcl]; and dilaudid [hydromorphone hcl].  MEDICATIONS:  Current Outpatient Medications  Medication Sig Dispense Refill  . amLODipine (NORVASC) 5 MG tablet Take 5 mg by mouth daily.    Marland Kitchen atenolol (TENORMIN) 25 MG tablet Take 25 mg by mouth 2 (two) times daily.     . Biotin 5000 MCG TABS Take 5,000 mcg by mouth daily.    . folic acid (FOLVITE) 1 MG tablet Take 1 tablet (1 mg total) by mouth daily. Start 5-7 days  before Alimta chemotherapy. Continue until 21 days after Alimta completed. 100 tablet 3  . lidocaine-prilocaine (EMLA) cream Apply to [facility] cath 1-2 hours prior to access as directed (Patient not taking: Reported on 12/01/2018) 30 g 1  . LORazepam (ATIVAN) 0.5 MG tablet 1 tablet po 30 minutes prior to radiation or MRI 30 tablet 0  . Magnesium 250 MG TABS Take 250 mg by mouth daily.     . Multiple Vitamin (MULTIVITAMIN WITH MINERALS) TABS tablet Take 1 tablet by mouth daily.    . ondansetron (ZOFRAN) 8 MG tablet Take 1 tablet (8 mg total) by mouth every 8 (eight) hours as needed (Nausea or vomiting). Start if needed on the third day after chemotherapy. 30 tablet 1  . potassium gluconate 595 MG TABS tablet Take 595 mg by mouth daily.     . prochlorperazine (COMPAZINE) 10 MG tablet Take 1 tablet (10 mg total) by mouth every 6 (six) hours as needed (Nausea or vomiting). 30 tablet 1  . simvastatin (ZOCOR) 20 MG tablet Take 20 mg by mouth every evening.     . traMADol (ULTRAM) 50 MG tablet Take 1 tablet (50 mg total) by mouth every 6 (six) hours as needed (mild pain). 30 tablet 0  . TURMERIC PO Take 1 capsule by mouth daily.     No current facility-administered medications for this visit.    Facility-Administered Medications Ordered in Other Visits  Medication Dose Route Frequency Provider Last Rate Last Dose  . heparin lock flush 100 unit/mL  500 Units Intracatheter Once Alvy Bimler, Akari Defelice, MD      . sodium chloride flush (NS) 0.9 % injection 10 mL  10 mL Intracatheter Once Alvy Bimler, Brysen Shankman, MD      . sodium chloride flush (NS) 0.9 % injection 10 mL  10 mL Intracatheter PRN Alvy Bimler, Aleksa Catterton, MD   10 mL at 01/07/19 1206    PHYSICAL EXAMINATION: ECOG PERFORMANCE STATUS: 1 - Symptomatic but completely ambulatory  Vitals:   01/07/19 0840  BP: (!) 158/80  Pulse: 68  Resp: 18  Temp: 98.9 F (37.2 C)  SpO2: 100%   Filed Weights   01/07/19 0840  Weight: 109 lb (49.4 kg)    GENERAL:alert, no distress and  comfortable SKIN: skin color, texture, turgor are normal, no rashes or significant lesions EYES: normal, Conjunctiva are pink and non-injected, sclera clear OROPHARYNX:no exudate, no erythema and lips, buccal mucosa, and tongue normal  NECK: supple, thyroid normal size, non-tender, without nodularity LYMPH:  no palpable lymphadenopathy in the cervical, axillary or inguinal LUNGS: clear to auscultation and percussion with normal breathing effort HEART: regular rate & rhythm and no murmurs and no lower extremity edema ABDOMEN:abdomen soft, non-tender and normal bowel sounds Musculoskeletal:no cyanosis of digits and no clubbing  NEURO: alert & oriented x 3 with fluent speech, no focal motor/sensory deficits  LABORATORY DATA:  I have reviewed the data as listed    Component Value Date/Time   NA 141 01/07/2019 0840   NA 140 05/09/2017 0844   K 3.8 01/07/2019 0840   K 4.1 05/09/2017 0844   CL 108 01/07/2019 0840   CO2 24 01/07/2019 0840   CO2 26 05/09/2017 0844   GLUCOSE 112 (H) 01/07/2019 0840   GLUCOSE 82 05/09/2017 0844   BUN 21 01/07/2019 0840   BUN 20.7 05/09/2017 0844   CREATININE 0.79 01/07/2019 0840   CREATININE 0.80 12/30/2018 0955   CREATININE 0.7 05/09/2017 0844   CALCIUM 9.5 01/07/2019 0840   CALCIUM 9.3 05/09/2017 0844   PROT 6.9 01/07/2019 0840   PROT 6.8 05/09/2017 0844   ALBUMIN 3.5 01/07/2019 0840   ALBUMIN 3.6 05/09/2017 0844   AST 24 01/07/2019 0840   AST 24 12/30/2018 0955   AST 27 05/09/2017 0844   ALT 14 01/07/2019 0840   ALT 15 12/30/2018 0955   ALT 17 05/09/2017 0844   ALKPHOS 60 01/07/2019 0840   ALKPHOS 43 05/09/2017 0844   BILITOT 0.6 01/07/2019 0840   BILITOT 0.4 12/30/2018 0955   BILITOT 0.81 05/09/2017 0844   GFRNONAA >60 01/07/2019 0840   GFRNONAA >60 12/30/2018 0955   GFRAA >60 01/07/2019 0840   GFRAA >60 12/30/2018 0955    No results found for: SPEP, UPEP  Lab Results  Component Value Date   WBC 7.4 01/07/2019   NEUTROABS 4.7  01/07/2019   HGB 11.1 (L) 01/07/2019   HCT 34.1 (L) 01/07/2019   MCV 93.4 01/07/2019   PLT 218 01/07/2019      Chemistry      Component Value Date/Time   NA 141 01/07/2019 0840   NA 140 05/09/2017 0844   K 3.8 01/07/2019 0840   K 4.1 05/09/2017 0844   CL 108 01/07/2019 0840   CO2 24 01/07/2019 0840   CO2 26 05/09/2017 0844   BUN 21 01/07/2019 0840   BUN 20.7 05/09/2017 0844   CREATININE 0.79 01/07/2019 0840   CREATININE 0.80 12/30/2018 0955   CREATININE 0.7 05/09/2017 0844      Component Value Date/Time   CALCIUM 9.5 01/07/2019 0840   CALCIUM 9.3 05/09/2017 0844   ALKPHOS 60 01/07/2019 0840   ALKPHOS 43 05/09/2017 0844   AST 24 01/07/2019 0840   AST 24 12/30/2018 0955   AST 27 05/09/2017 0844   ALT 14 01/07/2019 0840   ALT 15 12/30/2018 0955   ALT 17 05/09/2017 0844   BILITOT 0.6 01/07/2019 0840   BILITOT 0.4 12/30/2018 0955   BILITOT 0.81 05/09/2017 0844       RADIOGRAPHIC STUDIES: I have personally reviewed the radiological images as listed and agreed with the findings in the report. Dg Chest 2 View  Result Date: 12/21/2018 CLINICAL DATA:  Lung cancer EXAM: CHEST - 2 VIEW COMPARISON:  December 11, 2018 FINDINGS: The left-sided Port-A-Cath is unchanged in positioning. There is a small to moderate-sized right-sided pleural effusion which appears to have increased in size from prior study. There is an opacity in the  right mid lung zone which is similar to prior study. The lungs are somewhat hyperexpanded. There are emphysematous changes bilaterally. There may be a trace residual right apical pneumothorax. Surgical clips project over the patient's right chest wall. There is no acute osseous abnormality. IMPRESSION: 1. Probable trace residual right apical pneumothorax. 2. Small to moderate size right-sided pleural effusion. 3. Stable appearance of the right mid lung zone. 4. Stable positioning of the left-sided Port-A-Cath. These results will be called to the ordering clinician  or representative by the Radiologist Assistant, and communication documented in the PACS or zVision Dashboard. Electronically Signed   By: Constance Holster M.D.   On: 12/21/2018 15:07   Dg Chest 2 View  Result Date: 12/11/2018 CLINICAL DATA:  Follow-up history of lung surgery. EXAM: CHEST - 2 VIEW COMPARISON:  Chest radiograph 12/10/2018 FINDINGS: Left anterior chest wall Port-A-Cath is present with tip projecting over the superior vena cava. Monitoring leads overlie the patient. Stable cardiac and mediastinal contours. Similar-appearing consolidation right lower hemithorax. Mild interval decrease in size of right apical pneumothorax. No pleural effusion. Thoracic spine degenerative changes. IMPRESSION: Mild interval decrease in size of right apical pneumothorax. Electronically Signed   By: Lovey Newcomer M.D.   On: 12/11/2018 09:13   Dg Chest 2 View  Result Date: 12/10/2018 CLINICAL DATA:  Pneumothorax on the right EXAM: CHEST - 2 VIEW COMPARISON:  Yesterday FINDINGS: Mild increase in the right pneumothorax at the apex, now seen below the fourth rib. Increased right perihilar atelectasis. Hyperinflation. Heart size is normal. Aortic tortuosity. Unremarkable porta catheter on the left. IMPRESSION: Mild increase in the small right apical pneumothorax. Electronically Signed   By: Monte Fantasia M.D.   On: 12/10/2018 09:19   Mr Jeri Cos IR Contrast  Result Date: 01/05/2019 CLINICAL DATA:  78 year old female with recently diagnosed lung cancer, non-small cell. Staging. History of meningioma. History also of metastatic endometrial carcinoma. EXAM: MRI HEAD WITHOUT AND WITH CONTRAST TECHNIQUE: Multiplanar, multiecho pulse sequences of the brain and surrounding structures were obtained without and with intravenous contrast. CONTRAST:  5 milliliters Gadavist COMPARISON:  PET-CT 11/11/2018. Report of brain MRI 12/12/2004 (no images available). FINDINGS: Brain: In the midline attached to the falx located below the  superior sagittal sinus there is a 12 x 15 millimeter enhancing extra-axial mass compatible with the chronic meningioma described in 2006 (size unchanged since that time). No significant mass effect. No regional cerebral edema. No other abnormal intracranial enhancement or dural thickening identified. There is a normal sulcal variation suspected along the right superior frontal gyrus (series 6, image 22). No restricted diffusion to suggest acute infarction. No midline shift, mass effect, ventriculomegaly, extra-axial fluid collection or acute intracranial hemorrhage. Cervicomedullary junction and pituitary are within normal limits. There is patchy and widespread bilateral cerebral white matter T2 and FLAIR hyperintensity in both hemispheres. Mild to moderate patchy T2 heterogeneity in the pons. No cortical encephalomalacia. No chronic blood products. The deep gray nuclei and cerebellum are within normal limits for age. Vascular: Major intracranial vascular flow voids are preserved, with generalized intracranial artery tortuosity. The major dural venous sinuses are enhancing and appear to be patent. Skull and upper cervical spine: Partially visible cervical spine disc and endplate degeneration. Mild degenerative spinal stenosis probably with mild spinal cord mass effect at C4-C5. Otherwise negative visible spinal cord. Visualized bone marrow signal is within normal limits. Sinuses/Orbits: Negative orbits. Mild or trace paranasal sinus mucosal thickening. Other: Visible internal auditory structures appear normal. Mastoids are clear. Scalp  and face soft tissues appear negative. IMPRESSION: 1. No metastatic disease or acute intracranial abnormality identified. 2. Small chronic parafalcine meningioma size is stable since that described in 2006 (12 x 15 mm). 3. Advanced signal changes in the cerebral white matter and to a lesser extent pons, nonspecific but most commonly due to chronic small vessel disease. 4. Partially  visible degenerative cervical spinal stenosis. Electronically Signed   By: Genevie Ann M.D.   On: 01/05/2019 16:36   Dg Chest 1v Repeat Same Day  Result Date: 12/09/2018 CLINICAL DATA:  Chest tube removal EXAM: CHEST - 1 VIEW SAME DAY COMPARISON:  12/09/2018 FINDINGS: Interval removal of the right-sided chest tube. Small right apical pneumothorax measuring less than 10%. Right basilar atelectasis. Stable cardiomediastinal silhouette. Left-sided Port-A-Cath in satisfactory position. IMPRESSION: Interval removal of the right-sided chest tube. Small right apical pneumothorax measuring less than 10%. Electronically Signed   By: Kathreen Devoid   On: 12/09/2018 13:04   Dg Chest Port 1 View  Result Date: 12/09/2018 CLINICAL DATA:  Patient status post right thoracoscopy 12/07/2018 4 right lower lobe superior segmentectomy and wedge resection of the right upper lobe. EXAM: PORTABLE CHEST 1 VIEW COMPARISON:  Single-view of the chest 12/08/2018 and 12/07/2018. FINDINGS: Port-A-Cath and right chest tube remain in place. Small right apical pneumothorax estimated at 10% is new since yesterday's examination. The left lung is expanded and clear. Volume loss in the right chest and mild right basilar atelectasis noted. Heart size is enlarged. Aortic atherosclerosis. IMPRESSION: Small right pneumothorax estimated at 10% is new since yesterday's examination. No other change. Electronically Signed   By: Inge Rise M.D.   On: 12/09/2018 08:38    All questions were answered. The patient knows to call the clinic with any problems, questions or concerns. No barriers to learning was detected.  I spent 15 minutes counseling the patient face to face. The total time spent in the appointment was 20 minutes and more than 50% was on counseling and review of test results  Heath Lark, MD 01/07/2019 12:33 PM

## 2019-01-07 NOTE — Progress Notes (Signed)
Pt c/o increased lightheadedness at start of Alimta, vss. No other complaints Sandi Mealy, PA at chair side. Per Lucianne Lei continue with Alimta

## 2019-01-07 NOTE — Patient Instructions (Signed)

## 2019-01-08 ENCOUNTER — Telehealth: Payer: Self-pay | Admitting: *Deleted

## 2019-01-08 NOTE — Telephone Encounter (Signed)
-----   Message from Royston Bake, RN sent at 01/07/2019 11:44 AM EDT ----- Regarding: Dr. Alvy Bimler first time Alimta Carbo Dr. Alvy Bimler first time Alimta carbo.  She had [place] in 2013 but not alimta

## 2019-01-08 NOTE — Telephone Encounter (Signed)
Called & spoke with pt regarding recent first treatment with Carboplatin & Alimta.  She denies any problems & states she feels good.  She reports that she will work on oral fluids today & understands side effects & knows to call with questions/concerns.

## 2019-01-13 ENCOUNTER — Telehealth: Payer: Self-pay | Admitting: *Deleted

## 2019-01-13 ENCOUNTER — Encounter: Payer: Self-pay | Admitting: *Deleted

## 2019-01-13 NOTE — Progress Notes (Signed)
Oncology Nurse Navigator Documentation  Oncology Nurse Navigator Flowsheets 01/13/2019  Diagnosis Status -  Navigator Follow Up Date: -  Navigator Follow Up Reason: -  Navigator Location [facility]  Navigator Encounter Type Other/I followed up on Ms. Garzon[facility] report.  I printed report and gave it to Dr. Alvy Bimler nurse.    Telephone -  Abnormal Finding Date -  Confirmed Diagnosis Date -  Treatment Phase Pre-Tx/Tx Discussion  Barriers/Navigation Needs Coordination of Care  Interventions Coordination of Care  Coordination of Care Other  Acuity Level 2  Acuity Level 1 -  Time Spent with Patient 30

## 2019-01-13 NOTE — Telephone Encounter (Signed)
Patient called with concerns of continued lightheadedness. Patient reports she felt it while she was getting the Alimta. It has gotten better-today has been the best day, but there are times it is very strong. Patient reports her blood pressure has been very low for her the past few days. She discontinued her BP medication yesterday due to the readings and advise from her husband (a former Software engineer). She states her reading today without medication is 113/70's. She will continue to monitor her BP and keep a log for reference at her next appt. Patient understands to call this office with any further concerns and will obtain emergency medical assistance with any acute cardiac concerns.

## 2019-01-14 ENCOUNTER — Other Ambulatory Visit: Payer: Self-pay | Admitting: Hematology and Oncology

## 2019-01-14 ENCOUNTER — Encounter: Payer: Self-pay | Admitting: Hematology and Oncology

## 2019-01-14 ENCOUNTER — Inpatient Hospital Stay: Payer: Medicare Other | Attending: Hematology and Oncology | Admitting: Hematology and Oncology

## 2019-01-14 ENCOUNTER — Inpatient Hospital Stay: Payer: Medicare Other

## 2019-01-14 ENCOUNTER — Other Ambulatory Visit: Payer: Self-pay

## 2019-01-14 ENCOUNTER — Other Ambulatory Visit: Payer: Medicare Other

## 2019-01-14 VITALS — BP 150/69 | HR 91

## 2019-01-14 DIAGNOSIS — I959 Hypotension, unspecified: Secondary | ICD-10-CM | POA: Diagnosis not present

## 2019-01-14 DIAGNOSIS — Z8542 Personal history of malignant neoplasm of other parts of uterus: Secondary | ICD-10-CM | POA: Insufficient documentation

## 2019-01-14 DIAGNOSIS — J320 Chronic maxillary sinusitis: Secondary | ICD-10-CM | POA: Insufficient documentation

## 2019-01-14 DIAGNOSIS — D701 Agranulocytosis secondary to cancer chemotherapy: Secondary | ICD-10-CM

## 2019-01-14 DIAGNOSIS — K5730 Diverticulosis of large intestine without perforation or abscess without bleeding: Secondary | ICD-10-CM | POA: Diagnosis not present

## 2019-01-14 DIAGNOSIS — C3431 Malignant neoplasm of lower lobe, right bronchus or lung: Secondary | ICD-10-CM | POA: Diagnosis not present

## 2019-01-14 DIAGNOSIS — R42 Dizziness and giddiness: Secondary | ICD-10-CM | POA: Insufficient documentation

## 2019-01-14 DIAGNOSIS — M4802 Spinal stenosis, cervical region: Secondary | ICD-10-CM | POA: Insufficient documentation

## 2019-01-14 DIAGNOSIS — C7951 Secondary malignant neoplasm of bone: Secondary | ICD-10-CM

## 2019-01-14 DIAGNOSIS — I700 Atherosclerosis of aorta: Secondary | ICD-10-CM | POA: Insufficient documentation

## 2019-01-14 DIAGNOSIS — D61818 Other pancytopenia: Secondary | ICD-10-CM | POA: Insufficient documentation

## 2019-01-14 DIAGNOSIS — C3491 Malignant neoplasm of unspecified part of right bronchus or lung: Secondary | ICD-10-CM

## 2019-01-14 DIAGNOSIS — D6481 Anemia due to antineoplastic chemotherapy: Secondary | ICD-10-CM

## 2019-01-14 DIAGNOSIS — I2510 Atherosclerotic heart disease of native coronary artery without angina pectoris: Secondary | ICD-10-CM | POA: Insufficient documentation

## 2019-01-14 DIAGNOSIS — Z23 Encounter for immunization: Secondary | ICD-10-CM | POA: Insufficient documentation

## 2019-01-14 DIAGNOSIS — T451X5A Adverse effect of antineoplastic and immunosuppressive drugs, initial encounter: Secondary | ICD-10-CM

## 2019-01-14 DIAGNOSIS — K8020 Calculus of gallbladder without cholecystitis without obstruction: Secondary | ICD-10-CM | POA: Insufficient documentation

## 2019-01-14 DIAGNOSIS — E860 Dehydration: Secondary | ICD-10-CM | POA: Insufficient documentation

## 2019-01-14 DIAGNOSIS — Z95828 Presence of other vascular implants and grafts: Secondary | ICD-10-CM

## 2019-01-14 DIAGNOSIS — I10 Essential (primary) hypertension: Secondary | ICD-10-CM | POA: Diagnosis not present

## 2019-01-14 DIAGNOSIS — Z9221 Personal history of antineoplastic chemotherapy: Secondary | ICD-10-CM | POA: Insufficient documentation

## 2019-01-14 DIAGNOSIS — Z79899 Other long term (current) drug therapy: Secondary | ICD-10-CM | POA: Diagnosis not present

## 2019-01-14 DIAGNOSIS — C541 Malignant neoplasm of endometrium: Secondary | ICD-10-CM | POA: Diagnosis not present

## 2019-01-14 DIAGNOSIS — Z853 Personal history of malignant neoplasm of breast: Secondary | ICD-10-CM

## 2019-01-14 DIAGNOSIS — Z923 Personal history of irradiation: Secondary | ICD-10-CM | POA: Diagnosis not present

## 2019-01-14 DIAGNOSIS — C549 Malignant neoplasm of corpus uteri, unspecified: Secondary | ICD-10-CM

## 2019-01-14 DIAGNOSIS — D320 Benign neoplasm of cerebral meninges: Secondary | ICD-10-CM | POA: Insufficient documentation

## 2019-01-14 LAB — CBC WITH DIFFERENTIAL (CANCER CENTER ONLY)
Abs Immature Granulocytes: 0.01 10*3/uL (ref 0.00–0.07)
Basophils Absolute: 0 10*3/uL (ref 0.0–0.1)
Basophils Relative: 1 %
Eosinophils Absolute: 0.1 10*3/uL (ref 0.0–0.5)
Eosinophils Relative: 2 %
HCT: 31.3 % — ABNORMAL LOW (ref 36.0–46.0)
Hemoglobin: 10.2 g/dL — ABNORMAL LOW (ref 12.0–15.0)
Immature Granulocytes: 0 %
Lymphocytes Relative: 23 %
Lymphs Abs: 0.8 10*3/uL (ref 0.7–4.0)
MCH: 31 pg (ref 26.0–34.0)
MCHC: 32.6 g/dL (ref 30.0–36.0)
MCV: 95.1 fL (ref 80.0–100.0)
Monocytes Absolute: 0.3 10*3/uL (ref 0.1–1.0)
Monocytes Relative: 10 %
Neutro Abs: 2.1 10*3/uL (ref 1.7–7.7)
Neutrophils Relative %: 64 %
Platelet Count: 167 10*3/uL (ref 150–400)
RBC: 3.29 MIL/uL — ABNORMAL LOW (ref 3.87–5.11)
RDW: 12 % (ref 11.5–15.5)
WBC Count: 3.3 10*3/uL — ABNORMAL LOW (ref 4.0–10.5)
nRBC: 0 % (ref 0.0–0.2)

## 2019-01-14 LAB — CMP (CANCER CENTER ONLY)
ALT: 18 U/L (ref 0–44)
AST: 24 U/L (ref 15–41)
Albumin: 3.6 g/dL (ref 3.5–5.0)
Alkaline Phosphatase: 58 U/L (ref 38–126)
Anion gap: 10 (ref 5–15)
BUN: 20 mg/dL (ref 8–23)
CO2: 25 mmol/L (ref 22–32)
Calcium: 9.6 mg/dL (ref 8.9–10.3)
Chloride: 105 mmol/L (ref 98–111)
Creatinine: 0.77 mg/dL (ref 0.44–1.00)
GFR, Est AFR Am: >60 mL/min (ref >60)
GFR, Estimated: >60 mL/min (ref >60)
Glucose, Bld: 118 mg/dL — ABNORMAL HIGH (ref 70–99)
Potassium: 4.4 mmol/L (ref 3.5–5.1)
Sodium: 140 mmol/L (ref 135–145)
Total Bilirubin: 0.7 mg/dL (ref 0.3–1.2)
Total Protein: 6.9 g/dL (ref 6.5–8.1)

## 2019-01-14 LAB — SAMPLE TO BLOOD BANK

## 2019-01-14 MED ORDER — SODIUM CHLORIDE 0.9 % IV SOLN
Freq: Once | INTRAVENOUS | Status: AC
  Administered 2019-01-14: 12:00:00 via INTRAVENOUS
  Filled 2019-01-14: qty 250

## 2019-01-14 MED ORDER — HEPARIN SOD (PORK) LOCK FLUSH 100 UNIT/ML IV SOLN
500.0000 [IU] | Freq: Once | INTRAVENOUS | Status: AC
  Administered 2019-01-14: 500 [IU]
  Filled 2019-01-14: qty 5

## 2019-01-14 MED ORDER — SODIUM CHLORIDE 0.9% FLUSH
10.0000 mL | Freq: Once | INTRAVENOUS | Status: AC
  Administered 2019-01-14: 11:00:00 10 mL
  Filled 2019-01-14: qty 10

## 2019-01-14 MED ORDER — SODIUM CHLORIDE 0.9% FLUSH
10.0000 mL | Freq: Once | INTRAVENOUS | Status: AC
  Administered 2019-01-14: 10 mL
  Filled 2019-01-14: qty 10

## 2019-01-14 NOTE — Assessment & Plan Note (Signed)
The patient did not feel well after treatment Clinically, she appears somewhat dehydrated We will proceed with IV fluid resuscitation and supportive care

## 2019-01-14 NOTE — Assessment & Plan Note (Signed)
She has mild hypertension today but at home, she was borderline hypotension She has stopped all her blood pressure medication several days ago I recommend resumption of beta-blocker but to hold amlodipine

## 2019-01-14 NOTE — Telephone Encounter (Signed)
She is on 2 different BP medications I suggest holding Norvasc

## 2019-01-14 NOTE — Telephone Encounter (Signed)
Called and given below message. She verbalized understanding. She is requesting appt. She is exhausted and gets short of breath just walking in her house. Appts given for today. She verbalized understanding.

## 2019-01-14 NOTE — Patient Instructions (Signed)

## 2019-01-14 NOTE — Progress Notes (Signed)
Pt received 1L IVF NS, tolerated well.  VSS.  Reports feeling better at end of infusion.  Ate and drank during without any issues.  Encouraged oral hydration at home, pt verbalized understanding.  Denies any questions or concerns at time of d/c, aware to f/u as needed.

## 2019-01-14 NOTE — Progress Notes (Signed)
[place] OFFICE PROGRESS NOTE  Patient Care Team: Shon Baton, MD as PCP - General (Internal Medicine) Heath Lark, MD as Consulting Physician (Hematology and Oncology) Heath Lark, MD as Consulting Physician (Hematology and Oncology) Nancy Marus, MD as Attending Physician (Gynecologic Oncology)  ASSESSMENT & PLAN:  Primary lung cancer, right [facility]) The patient did not feel well after treatment Clinically, she appears somewhat dehydrated We will proceed with IV fluid resuscitation and supportive care  Arterial hypotension She has mild hypertension today but at home, she was borderline hypotension She has stopped all her blood pressure medication several days ago I recommend resumption of beta-blocker but to hold amlodipine   Dehydration Clinically, she appears somewhat dehydrated We will proceed with fluid resuscitation I encouraged her to increase oral intake as tolerated  Pancytopenia, acquired ([place]) She has mild pancytopenia due to treatment Observe only.  She does not need transfusion support   No orders of the defined types were placed in this encounter.   INTERVAL HISTORY: Please see below for problem oriented charting. She is seen urgently today due to feeling unwell Her blood pressure at home has been borderline low over the past few days She felt somewhat dizzy Denies recent nausea or vomiting No diarrhea Her appetite was stable No recent infection, fever or chills  SUMMARY OF ONCOLOGIC HISTORY: Oncology History Overview Note  Biopsy of recurrence 06-2014 ER negative. PDL1 testing low at 2% on the uterine cancer HER 2 negative on pathology [id number] Negative genetic testing  On her lung cancer sample, [employer] [facility] testing revealed 10% PD-1 positive EGFR mutation was detected   Malignant neoplasm of corpus uteri, except isthmus ([place])  12/22/2007 Surgery   Staging for IA UPSC    - 05/28/2008 Chemotherapy   6 cycles of paclitaxel and  carboplatin with vaginal cuff brachytherapy   03/22/2011 Relapse/Recurrence   Left pelvic node recurrence   04/25/2011 Surgery   L/S evaluation, not resectable.   05/16/2011 - 09/25/2011 Chemotherapy   6 cyclces paclitaxel and carboplatin    - 12/27/2011 Radiation Therapy   radiation completed   03/10/2013 Progression   progression left pelvic mass   04/02/2013 Surgery   Resection and IORT   06/13/2014 Relapse/Recurrence   soft tissue mass at L3-4   07/11/2014 - 08/12/2014 Radiation Therapy   IMRT + zometa   02/12/2016 Progression      - 01/18/2016 Chemotherapy   The patient had ondansetron (ZOFRAN) IVPB 16 mg, 16 mg (100 % of original dose 16 mg), Intravenous,  Once, 5 of 5 cycles Dose modification: 16 mg (original dose 16 mg, Cycle 2)  CARBOplatin (PARAPLATIN) 390 mg in sodium chloride 0.9 % 100 mL chemo infusion, 390 mg (100 % of original dose 392 mg), Intravenous,  Once, 5 of 5 cycles Dose modification: 392 mg (original dose 392 mg, Cycle 1), 446.5 mg (original dose 392 mg, Cycle 2), 390 mg (original dose 392 mg, Cycle 2), 342 mg (original dose 392 mg, Cycle 6)  CARBOplatin chemo intradermal Test Dose 100 mcg, 0.1 mg, Intradermal,  Once, 5 of 5 cycles  PACLitaxel (TAXOL) 252 mg in dextrose 5 % 250 mL chemo infusion (> 70m/m2), 175 mg/m2 = 252 mg, Intravenous,  Once, 6 of 6 cycles Dose modification: 135 mg/m2 (original dose 175 mg/m2, Cycle 5)  CARBOplatin (PARAPLATIN) 250 mg in sodium chloride 0.9 % 100 mL chemo infusion, 250 mg (100 % of original dose 254.4 mg), Intravenous,  Once, 6 of 6 cycles Dose modification:   (  original dose 254.4 mg, Cycle 1)  PACLitaxel (TAXOL) 114 mg in dextrose 5 % 250 mL chemo infusion ( ondansetron (ZOFRAN) 16 mg, dexamethasone (DECADRON) 20 mg in sodium chloride 0.9 % 50 mL IVPB, , Intravenous,  Once, 6 of 6 cycles  for chemotherapy treatment.     01/07/2019 -  Chemotherapy   The patient had palonosetron (ALOXI) injection 0.25 mg, 0.25 mg,  Intravenous,  Once, 1 of 4 cycles Administration: 0.25 mg (01/07/2019) PEMEtrexed (ALIMTA) 700 mg in sodium chloride 0.9 % 100 mL chemo infusion, 500 mg/m2 = 700 mg, Intravenous,  Once, 1 of 4 cycles Administration: 700 mg (01/07/2019) CARBOplatin (PARAPLATIN) 300 mg in sodium chloride 0.9 % 250 mL chemo infusion, 300 mg (100 % of original dose 304.5 mg), Intravenous,  Once, 1 of 4 cycles Dose modification:   (original dose 304.5 mg, Cycle 1) Administration: 300 mg (01/07/2019) fosaprepitant (EMEND) 150 mg, dexamethasone (DECADRON) 12 mg in sodium chloride 0.9 % 145 mL IVPB, , Intravenous,  Once, 1 of 4 cycles Administration:  (01/07/2019)  for chemotherapy treatment.    Endometrial cancer ([place])  12/01/2007 Initial Diagnosis   She has recurrent papillary serous endometrial carcinoma. Briefly she was diagnosed in 2009 with a FIGO IB pappilary serous carcinoma treated with hysterectomy followed by adjuvant chemo and vaginal brachytherapy. She then had a recurrence diagnosed in late 2012 that was deemed not to be surgically resectable, and was treated with 6 cycles of carbo/taxol followed by 5040 cGy of EBRT to the pelvic mass. She was then followed with serial imaging and was noted in June of this year to have slight increase in the size of the mass, and again in September there was small enlargement of the mass. She had a re-staging PET scan on 03/10/13 that showed FDG activity in the pelvic mass, with no other areas of disease   12/01/2007 Imaging   CT scan of chest, abdomen and pelvis: 1.  Mass like area of decreased attenuation in the central uterus, consistent with the known endometrial carcinoma.  Deep myometrial invasion is suspected.  Pelvic MRI without and with contrast could be performed for further staging workup if clinically warranted. 2.  No evidence of extrauterine extension or lymphadenopathy. 3.  Sigmoid diverticulosis incidentally noted.   12/15/2007 Surgery   --12/2007 laparoscopic  hysterectomy and PLND --05/2008 complted 6 cycles adjuvant carbo/taxol followed by vaginal cuff brachy --03/2011 exploratory lararoscopy, ureterolysis --4/13 completed 6 cycels carbotaxol --8/13 completed EBRT to the left pelvic sidewall    06/10/2008 Imaging   CT scan abdomen and pelvis 1.  Nonspecific mildly prominent inguinal lymph nodes, right greater than left. 2.  Interval hysterectomy without evidence of recurrent pelvic mass or fluid collection.   03/22/2011 Imaging   Ct scan abdomen and pelvis 1.  Local uterine cancer recurrence with two large nodes along the left pelvic sidewall. 2.  No evidence of bowel obstruction, urinary obstruction, or more distant metastasis.   03/31/2011 Relapse/Recurrence   chemotherapy and external beam radiation   05/16/2011 - 09/17/2011 Chemotherapy   She had 6 cycles of carboplatin and Taxol    07/17/2011 Imaging   Ct scan abdomen and pelvis 1.  Interval improvement in the previously demonstrated left pelvic local recurrence of tumor. 2.  No disease progression or complication identified. 3.  Mild bladder wall thickening on the right, nonspecific and possibly related to incomplete distension and/or radiation therapy. 4.  Cholelithiasis   10/11/2011 Imaging   CT scan abdomen and pelvis 1.  Interval  enlargement of the left pelvic sidewall masses. 2.  No new nodular disease in the pelvis or lymphadenopathy. 3.  No evidence  of distant metastasis. 4.  Mild hydronephrosis on the right is similar to prior. 5.  Focal thickening of the right aspect the bladder is stable compared to prior.    10/28/2011 - 12/05/2011 Radiation Therapy   radiation for pelvic sidewall recurrence   10/28/2011 - 12/05/2011 Radiation Therapy   10/28/11-12/05/11: Radiotherapy to the left pelvic sidewall   03/18/2012 Imaging   CT abdomen 1.  Today's study demonstrates a positive response to therapy with decreased size of left pelvic sidewall nodal masses, as detailed above.  No  new soft tissue masses or new lymphadenopathy is identified within the abdomen or pelvis. Continue attention on follow-up studies is recommended. 2.  Cholelithiasis without findings to suggest acute cholecystitis. 3.  Colonic diverticulosis without findings to suggest acute diverticulitis at this time. 4.  Mild asymmetric urinary bladder wall thickening is unchanged compared to prior examinations and is nonspecific.  No definite bladder wall mass is identified at this time. 5.  Additional findings, similar to prior examinations, as above   06/22/2012 Imaging   CT abdomen 1.  Further decreased size of the left pelvic peripherally enhancing lesion. 2.  No new sites of new or progressive disease. 3. Esophageal air fluid level suggests dysmotility or gastroesophageal reflux. 4.  Cholelithiasis   11/02/2012 Imaging   CT abdomen 1.  The left pelvic side wall lesion demonstrates mild increase in size from previous exam. 2.  No new sites of new or progressive disease. 3.  Cholelithiasis.    01/28/2013 Imaging   CT abdomen 1. Interval small increase in volume of enhancing mass adjacent to the left pelvic sidewall. 2. No evidence of abdominal or pelvic lymphadenopathy   04/02/2013 Surgery   pelvic mass resection with IORT   04/02/2013 - 04/02/2013 Radiation Therapy   04/02/13: Intraoperative radiotherapy to the left pelvis   05/10/2013 PET scan   1. Hypermetabolic mass in the deep left pelvis consistent with metastasis. 2. No additional evidence of local metastasis. No evidence of distant metastasis. 3. Small right lower lobe pulmonary nodule is not hypermetabolic.   07/27/2013 Imaging   No evidence of recurrent or metastatic disease. Apparent prior resection of the deep left pelvic metastasis.   02/08/2014 Imaging   No CT findings for recurrent or metastatic disease involving the abdomen/ pelvis   06/13/2014 Relapse/Recurrence   + recurrence paraspinous muscle   06/21/2014 Procedure    There is a soft tissue lesion along the left side of L3-L4. This lesion roughly measures up to 2.2 cm. Needle was positioned along the posterior aspect of the lesion. Small amount of air in the paraspinal tissues following needle removal   06/21/2014 Pathology Results   Bone, biopsy, left lumbar paraspinal - METASTATIC POORLY DIFFERENTIATED CARCINOMA, CONSISTENT WITH HIGH GRADE SEROUS CARCINOMA SEE COMMENT. Microscopic Comment The carcinoma demonstrates the following immunophenotype: Cytokeratin 7 - patchy moderate to strong expression Estrogen receptor - negative expression P53 - strong diffuse expression TTF-1 - negative expression WT-1 - negative expression CD56 - focal moderate strong expression Synaptophysin - negative expression GCDFP - negative expression The history of primary endometrial papillary serous carcinoma and primary mammary carcinoma is noted. In the current case, the overall morphologic and immunophenotype are that of poorly differentiated carcinoma, consistent with high grade serous carcinoma.    07/11/2014 - 08/12/2014 Radiation Therapy   07/11/14-08/12/14: 55 Gy in 25 fractions to the  Lumbar spine   08/15/2014 Imaging   CT scan of chest, abdomen and pelvis 1. Since the biopsy study of 06/21/2014, similar to slight decrease in size of a left paraspinous lesion at L3-4. 2. No new sites of metastatic disease identified. 3. 8 mm ground-glass nodule in the right lower lobe is grossly similar to 03/10/2013, suggesting a benign etiology. 4. Cholelithiasis. 5. Apparent sigmoid colonic wall thickening which could be due to underdistention. Colitis felt less likely. 6.  Atherosclerosis, including within the coronary arteries. 7. Pelvic floor laxity.   10/07/2014 Imaging   CT scan of chest, abdomen and pelvis: Stable to slight interval decrease in size of left paraspinous lesion at L3-4. No new sites of metastatic disease. Unchanged ground-glass nodule within the right lower lobe,  potentially benign in etiology. Recommend attention on followup. Cholelithiasis. Sigmoid colonic diverticulosis. No CT evidence to suggest acute diverticulitis.   01/19/2015 Imaging   Ct scan of abdomen and pelvis 1. Decrease in size of left paraspinous metastasis. 2. No new sites of disease. 3. Stable ground-glass attenuating nodule within the superior segment of right lower lobe. 4. Gallstones   05/02/2015 Imaging   Ct abdomen and pelvis: Slight interval increase in size of left lumbar paraspinal soft tissue metastasis. No new sites of metastatic disease identified within the abdomen or pelvis.    05/11/2015 PET scan   1. The small soft tissue mass just lateral to the left L3 for neural foramen is hypermetabolic, favoring malignancy. 2. There is also a small focus of hypermetabolic activity between the left L1 and L2 transverse processes, but without a CT correlate. 3. The 13 mm sub solid nodule in the superior segment right lower lobe is stable from recent exams but has slowly increased in size over the last 7 years. Although not hypermetabolic, the appearance is concerning for the possibility of a low grade adenocarcinoma. 4. Other imaging findings of potential clinical significance: Chronic bilateral maxillary sinusitis. Coronary, aortic arch, and branch vessel atherosclerotic vascular disease. Aortoiliac atherosclerotic vascular disease. Cholelithiasis. Sigmoid colon diverticulosis. Lumbar scoliosis.   07/11/2015 Imaging   Ct scan of chest, abdomen and pelvis: 1. 13 mm mixed solid and sub solid nodule in the posterior right lower lobe was present in 2009 and has clearly progressed in the interval since that study. Imaging features remain highly concerning for low-grade or well differentiated adenocarcinoma. 2. Slight increase size of in the hypermetabolic, rim enhancing nodule identified adjacent to the left L3-4 neural foramen. Metastatic disease remains a concern. 3. No evidence for  discrete soft tissue lesion between the left L1 and 2 transverse processes, the site of focal FDG uptake on the recent PET-CT. 4. Cholelithiasis. 5. Abdominal aortic atherosclerosis   08/03/2015 - 01/16/2016 Chemotherapy   She received carboplatin and Taxol   11/06/2015 PET scan   1. Hypermetabolic left F7-9 paraspinous metastasis (biopsy-proven) is stable in size and slightly decreased in metabolism. 2. Previously described small focus of hypermetabolism between the left L2 and L3 spinous processes has resolved. 3. New mild linear hypermetabolism to the left of the T11-12 spinous processes without discrete mass on the CT images, favor benign activity related activity, recommend attention on follow-up PET-CT.  4. No definite new sites of hypermetabolic metastatic disease. No recurrent hypermetabolic metastatic disease in the pelvis. 5. Interval stability of subsolid 1.3 cm superior segment right lower lobe pulmonary nodule without associated significant metabolism, which has grown compared to the 2009 chest CT study, and remain suspicious for low grade adenocarcinoma. 6.  Additional findings include aortic atherosclerosis, coronary atherosclerosis, mild multinodular goiter with no hypermetabolic thyroid nodules, cholelithiasis and moderate sigmoid diverticulosis.   02/12/2016 PET scan   1. Interval increase in size and metabolic activity of the LEFT paraspinal soft tissue metastasis. 2. New activity within the musculature of the LEFT chest wall. Given the unusual location of the paraspinal metastasis cannot exclude a second soft tissue metastasis to the musculature however favor benign physiologic activity. 3. Stable RIGHT lower lobe pulmonary nodule. 4. No evidence of local recurrence.     04/08/2016 Imaging   Ct scan of chest, abdomen and pelvis: 1. Similar size of a  left paravertebral abdominal lesion. 2. No new or progressive metastatic disease. 3. No correlate for the muscular activity  about the lateral left chest wall. 4.  Coronary artery atherosclerosis. Aortic atherosclerosis. 5. Cholelithiasis. 6. Similar right lower lobe pulmonary nodule.   06/03/2016 Imaging   CT abdomen and pelvis 1. Similar to mild enlargement of a paravertebral soft tissue lesion at the L3-4 level. 2. No new sites of disease identified. 3. Cholelithiasis. 4. Hysterectomy. 5.  Aortic atherosclerosis.    09/02/2016 Imaging   CT abdomen and pelvis 1. Continue mild interval increase in size of LEFT paraspinal mass (1-2 mm). 2. No evidence of new metastatic disease in the abdomen pelvis. 3. Post hysterectomy anatomy   09/26/2016 Imaging   MR lumbar spine 1. Left paraspinal metastasis with epicenter adjacent to the left L3 neural foramen does extend into the foramen to the level of the left lateral epidural space (series 9, image 31), but also tracks cephalad and caudal along the L2-L4 lumbar plexus (series 10, image 15). No extension into the left L2 or L4 neural foramina. And no more distal extension of tumor. 2. Abnormal signal in the medial left psoas and ventral left erector spinae muscles at L2 and L3 is probably denervation related. 3. No bone invasion or osseous metastatic disease. Suspect previous radiation of the L2 through L5 spinal levels. 4. No dural or intradural metastatic disease.    10/22/2016 Procedure   She underwent stereotactic Radiosurgery   10/30/2016 Genetic Testing   Patient has genetic testing done for Inheritable genetic mutation panel. Results revealed patient has no actionable mutation.   01/21/2017 Imaging   1. Reduced size and conspicuity of the left paraspinal mass at the L3-4 level. The mass still present but has enhancement similar to that of adjacent psoas musculature. 2. No new metastatic disease is identified. 3. Other imaging findings of potential clinical significance: Cholelithiasis. Aortic Atherosclerosis (ICD10-I70.0). Sigmoid diverticulosis. Lumbar  scoliosis.   05/09/2017 Imaging   Ct scan of abdomen and pelvis 1. Paraspinal mass adjacent to the left side of L3-L4 is less distinct than prior examinations, and is therefore difficult to discretely measure, however, the overall appearance suggests continued positive response to therapy. 2. No new signs of metastatic disease elsewhere in the abdomen or pelvis. 3. Aortic atherosclerosis, in addition to at least right coronary artery disease. Assessment for potential risk factor modification, dietary therapy or pharmacologic therapy may be warranted, if clinically indicated. 4. Colonic diverticulosis without evidence of acute diverticulitis at this time. 5. Additional incidental findings, as above. Aortic Atherosclerosis (ICD10-I70.0).   08/14/2017 Imaging   1. Treated left paraspinal mass centered at L3-4. Mass size is stable but there has been some evolution of tumor characteristics with less central fluid seen today. Recommend continued surveillance. 2. No evidence of untreated metastasis. 3. Degenerative changes and related impingement are described above.  10/28/2017 Imaging   Stable small left paraspinal soft tissue mass. No new or progressive disease within the abdomen or pelvis.  Cholelithiasis.  No radiographic evidence of cholecystitis.  Colonic diverticulosis, without radiographic evidence of diverticulitis.   04/30/2018 Imaging   Stable post treatment change of the partially necrotic LEFT paravertebral mass at L3-L4. 18 x 21 mm cross-section. Mass effect on the LEFT L3 nerve root redemonstrated. Enhancing and atrophic LEFT psoas is inseparable.   10/15/2018 Imaging   1. Stable post treatment size and appearance of partially necrotic left paravertebral mass at L3-4, measuring 19 x 25 mm on current exam (unchanged when measured at similar level on previous study). Mass effect on the adjacent left L3 nerve root is unchanged. Adjacent enhancing and atrophic left psoas muscle. 2.  Left convex scoliosis with associated multilevel degenerative spondylolysis and facet arthrosis, unchanged   10/29/2018 Imaging   Stable small left L3 paraspinal soft tissue mass. No evidence of new or progressive metastatic disease, or other acute findings.   Cholelithiasis.  No radiographic evidence of cholecystitis.   Colonic diverticulosis, without radiographic evidence of diverticulitis.   11/11/2018 PET scan   1. Low level metabolism (max SUV 2.0) associated with the irregular subsolid superior segment right lower lobe 2.1 cm pulmonary nodule. As better depicted on the recent diagnostic chest CT study, this nodule crosses the major fissure into the right upper lobe and has increased in size and density on multiple imaging studies back to 2009. Findings are most suggestive of primary bronchogenic adenocarcinoma. 2. No hypermetabolic thoracic adenopathy. 3. Persistent hypermetabolism (max SUV 8.0) associated with the known left L3-4 paraspinous metastasis, mildly decreased in metabolism since 02/12/2016 PET-CT. 4. No additional sites of hypermetabolic metastatic disease in the abdomen or pelvis. No metabolic evidence of peritoneal recurrence. No ascites. 5. Chronic findings include: Aortic Atherosclerosis (ICD10-I70.0). Coronary atherosclerosis. Chronic bilateral maxillary sinusitis. Cholelithiasis. Moderate sigmoid diverticulosis.   01/07/2019 -  Chemotherapy   The patient had palonosetron (ALOXI) injection 0.25 mg, 0.25 mg, Intravenous,  Once, 1 of 4 cycles Administration: 0.25 mg (01/07/2019) PEMEtrexed (ALIMTA) 700 mg in sodium chloride 0.9 % 100 mL chemo infusion, 500 mg/m2 = 700 mg, Intravenous,  Once, 1 of 4 cycles Administration: 700 mg (01/07/2019) CARBOplatin (PARAPLATIN) 300 mg in sodium chloride 0.9 % 250 mL chemo infusion, 300 mg (100 % of original dose 304.5 mg), Intravenous,  Once, 1 of 4 cycles Dose modification:   (original dose 304.5 mg, Cycle 1) Administration: 300 mg  (01/07/2019) fosaprepitant (EMEND) 150 mg, dexamethasone (DECADRON) 12 mg in sodium chloride 0.9 % 145 mL IVPB, , Intravenous,  Once, 1 of 4 cycles Administration:  (01/07/2019)  for chemotherapy treatment.    Metastatic cancer to bone ([place])  06/27/2014 Initial Diagnosis   Metastatic cancer to bone   Primary lung cancer, right ([place])  11/04/2018 Initial Diagnosis   Primary lung cancer, right ([place])   11/11/2018 PET scan   1. Low level metabolism (max SUV 2.0) associated with the irregular subsolid superior segment right lower lobe 2.1 cm pulmonary nodule. As better depicted on the recent diagnostic chest CT study, this nodule crosses the major fissure into the right upper lobe and has increased in size and density on multiple imaging studies back to 2009. Findings are most suggestive of primary bronchogenic adenocarcinoma. 2. No hypermetabolic thoracic adenopathy. 3. Persistent hypermetabolism (max SUV 8.0) associated with the known left L3-4 paraspinous metastasis, mildly decreased in metabolism since 02/12/2016 PET-CT. 4. No additional sites of hypermetabolic metastatic disease  in the abdomen or pelvis. No metabolic evidence of peritoneal recurrence. No ascites. 5. Chronic findings include: Aortic Atherosclerosis (ICD10-I70.0). Coronary atherosclerosis. Chronic bilateral maxillary sinusitis. Cholelithiasis. Moderate sigmoid diverticulosis.   12/07/2018 Pathology Results   1. Lung, resection (segmental or lobe), Right Lobe Superior with endblock wedge of right upper lobe - INVASIVE ADENOCARCINOMA, MODERATELY DIFFERENTIATED, SPANNING 1.6 CM. - THE SURGICAL RESECTION MARGINS ARE NEGATIVE FOR CARCINOMA. - SEE ONCOLOGY TABLE BELOW. 2. Lymph node, biopsy, Level 9 #1 - THERE IS NO EVIDENCE OF CARCINOMA IN 1 OF 1 LYMPH NODE (0/1). 3. Lymph node, biopsy, Level 9 #2 - THERE IS NO EVIDENCE OF CARCINOMA IN 1 OF 1 LYMPH NODE (0/1). 4. Lymph node, biopsy, Level 7 #1 - METASTATIC CARCINOMA IN 1 OF 1 LYMPH  NODE (1/1). 5. Lymph node, biopsy, Level 7 #2 - THERE IS NO EVIDENCE OF CARCINOMA IN 1 OF 1 LYMPH NODE (0/1). 6. Lymph node, biopsy, Level 12 #1 - THERE IS NO EVIDENCE OF CARCINOMA IN 1 OF 1 LYMPH NODE (0/1). 7. Lymph node, biopsy, Level 12 #2 - THERE IS NO EVIDENCE OF CARCINOMA IN 1 OF 1 LYMPH NODE (0/1). 8. Lymph node, biopsy, Level 10 #1 - THERE IS NO EVIDENCE OF CARCINOMA IN 1 OF 1 LYMPH NODE (0/1). 9. Lymph node, biopsy, Level 4R #1 - THERE IS NO EVIDENCE OF CARCINOMA IN 1 OF 1 LYMPH NODE (0/1). 10. Lymph node, biopsy, Level 4R #2 - THERE IS NO EVIDENCE OF CARCINOMA IN 1 OF 1 LYMPH NODE (0/1). 11. Lymph node, biopsy, Level 2R #1 - THERE IS NO EVIDENCE OF CARCINOMA IN 1 OF 1 LYMPH NODE (0/1). 12. Lung, resection (segmental or lobe), Right Lobe Superior - BENIGN LUNG PARENCHYMA. - THERE IS NO EVIDENCE OF MALIGNANCY. Procedure: Segmentectomy. Specimen Laterality: Right. Tumor Site: Right superior lobe. Tumor Size: 1.6 cm (gross measurement). Tumor Focality: Unifocal. Histologic Type: Adenocarcinoma, moderately differentiated. Visceral Pleura Invasion: Not identified. Lymphovascular Invasion: Not identified. Direct Invasion of Adjacent Structures: Not identified. Margins: Negative for carcinoma. Treatment Effect: N/A Regional Lymph Nodes: Number of Lymph Nodes Involved: 1 (level 7) Number of Lymph Nodes Examined: 10 Pathologic Stage Classification (pTNM, AJCC 8th Edition): pT1b, pN2 Ancillary Studies: The tumor cells are positive for Napsin-A, TTF-1, and p53. They are essentially negative for estrogen receptor, PAX-8 and progesterone receptor. Additional studies can be performed upon clinician request. Representative Tumor Block: 1D-1E. (JBK:gt, 12/09/18)   12/07/2018 Surgery   PREOPERATIVE DIAGNOSIS:  Right lung nodule.   POSTOPERATIVE DIAGNOSIS:  Non-small cell carcinoma- suspected primary lung carcinoma, clinical stage T2N01b.   PROCEDURE:   Right video-assisted  thoracoscopy, Right lower lobe superior segmentectomy with en bloc wedge resection of right upper lobe, Lymph node dissection, Intercostal nerve block levels 3-9.   SURGEON:  Modesto Charon, MD   FINDINGS:  Mass palpable in posterior aspect of superior segment of right lower lobe, likely involvement across fissure into the upper lobe.  Frozen section revealed non-small cell carcinoma. upper and lower lobe margins clear.   12/23/2018 Cancer Staging   Staging form: Lung, AJCC 8th Edition - Pathologic: Stage IIIA (pT1b, pN2, cM0) - Signed by Heath Lark, MD on 12/23/2018   01/05/2019 Imaging   MRI brain 1. No metastatic disease or acute intracranial abnormality identified. 2. Small chronic parafalcine meningioma size is stable since that described in 2006 (12 x 15 mm). 3. Advanced signal changes in the cerebral white matter and to a lesser extent pons, nonspecific but most commonly due to chronic small vessel  disease. 4. Partially visible degenerative cervical spinal stenosis.     01/07/2019 -  Chemotherapy   The patient had carboplatin and Alimta for chemotherapy treatment.       REVIEW OF SYSTEMS:   Constitutional: Denies fevers, chills or abnormal weight loss Eyes: Denies blurriness of vision Ears, nose, mouth, throat, and face: Denies mucositis or sore throat Respiratory: Denies cough, dyspnea or wheezes Cardiovascular: Denies palpitation, chest discomfort or lower extremity swelling Gastrointestinal:  Denies nausea, heartburn or change in bowel habits Skin: Denies abnormal skin rashes Lymphatics: Denies new lymphadenopathy or easy bruising Neurological:Denies numbness, tingling or new weaknesses Behavioral/Psych: Mood is stable, no new changes  All other systems were reviewed with the patient and are negative.  I have reviewed the past medical history, past surgical history, social history and family history with the patient and they are unchanged from previous  note.  ALLERGIES:  is allergic to ace inhibitors; codeine; demerol  [meperidine hcl]; and dilaudid [hydromorphone hcl].  MEDICATIONS:  Current Outpatient Medications  Medication Sig Dispense Refill  . amLODipine (NORVASC) 5 MG tablet Take 5 mg by mouth daily.    Marland Kitchen atenolol (TENORMIN) 25 MG tablet Take 25 mg by mouth 2 (two) times daily.     . Biotin 5000 MCG TABS Take 5,000 mcg by mouth daily.    . folic acid (FOLVITE) 1 MG tablet Take 1 tablet (1 mg total) by mouth daily. Start 5-7 days before Alimta chemotherapy. Continue until 21 days after Alimta completed. 100 tablet 3  . gabapentin (NEURONTIN) 100 MG capsule TAKE 2 3 CAPSULES AT NIGHT    . lidocaine-prilocaine (EMLA) cream Apply to [facility] cath 1-2 hours prior to access as directed (Patient not taking: Reported on 12/01/2018) 30 g 1  . LORazepam (ATIVAN) 0.5 MG tablet 1 tablet po 30 minutes prior to radiation or MRI 30 tablet 0  . Magnesium 250 MG TABS Take 250 mg by mouth daily.     . Multiple Vitamin (MULTIVITAMIN WITH MINERALS) TABS tablet Take 1 tablet by mouth daily.    . ondansetron (ZOFRAN) 8 MG tablet Take 1 tablet (8 mg total) by mouth every 8 (eight) hours as needed (Nausea or vomiting). Start if needed on the third day after chemotherapy. 30 tablet 1  . potassium gluconate 595 MG TABS tablet Take 595 mg by mouth daily.     . prochlorperazine (COMPAZINE) 10 MG tablet Take 1 tablet (10 mg total) by mouth every 6 (six) hours as needed (Nausea or vomiting). 30 tablet 1  . simvastatin (ZOCOR) 20 MG tablet Take 20 mg by mouth every evening.     . traMADol (ULTRAM) 50 MG tablet Take 1 tablet (50 mg total) by mouth every 6 (six) hours as needed (mild pain). 30 tablet 0  . TURMERIC PO Take 1 capsule by mouth daily.     No current facility-administered medications for this visit.    Facility-Administered Medications Ordered in Other Visits  Medication Dose Route Frequency Provider Last Rate Last Dose  . heparin lock flush 100 unit/mL   500 Units Intracatheter Once Alvy Bimler, Sussan Meter, MD      . heparin lock flush 100 unit/mL  500 Units Intracatheter Once Alvy Bimler, Joh Rao, MD      . sodium chloride flush (NS) 0.9 % injection 10 mL  10 mL Intracatheter Once Sanai Frick, MD      . sodium chloride flush (NS) 0.9 % injection 10 mL  10 mL Intracatheter Once Heath Lark, MD  PHYSICAL EXAMINATION: ECOG PERFORMANCE STATUS: 1 - Symptomatic but completely ambulatory  Vitals:   01/14/19 1126  BP: (!) 171/99  Pulse: (!) 130  Resp: 18  Temp: 99.1 F (37.3 C)  SpO2: 100%   Filed Weights   01/14/19 1126  Weight: 107 lb 12.8 oz (48.9 kg)    GENERAL:alert, no distress and comfortable SKIN: skin color, texture, turgor are normal, no rashes or significant lesions EYES: normal, Conjunctiva are pink and non-injected, sclera clear OROPHARYNX:no exudate, no erythema and lips, buccal mucosa, and tongue normal  NECK: supple, thyroid normal size, non-tender, without nodularity LYMPH:  no palpable lymphadenopathy in the cervical, axillary or inguinal LUNGS: clear to auscultation and percussion with normal breathing effort HEART: She is tachycardic with mild murmurs and no lower extremity edema ABDOMEN:abdomen soft, non-tender and normal bowel sounds Musculoskeletal:no cyanosis of digits and no clubbing  NEURO: alert & oriented x 3 with fluent speech, no focal motor/sensory deficits  LABORATORY DATA:  I have reviewed the data as listed    Component Value Date/Time   NA 140 01/14/2019 1042   NA 140 05/09/2017 0844   K 4.4 01/14/2019 1042   K 4.1 05/09/2017 0844   CL 105 01/14/2019 1042   CO2 25 01/14/2019 1042   CO2 26 05/09/2017 0844   GLUCOSE 118 (H) 01/14/2019 1042   GLUCOSE 82 05/09/2017 0844   BUN 20 01/14/2019 1042   BUN 20.7 05/09/2017 0844   CREATININE 0.77 01/14/2019 1042   CREATININE 0.7 05/09/2017 0844   CALCIUM 9.6 01/14/2019 1042   CALCIUM 9.3 05/09/2017 0844   PROT 6.9 01/14/2019 1042   PROT 6.8 05/09/2017 0844    ALBUMIN 3.6 01/14/2019 1042   ALBUMIN 3.6 05/09/2017 0844   AST 24 01/14/2019 1042   AST 27 05/09/2017 0844   ALT 18 01/14/2019 1042   ALT 17 05/09/2017 0844   ALKPHOS 58 01/14/2019 1042   ALKPHOS 43 05/09/2017 0844   BILITOT 0.7 01/14/2019 1042   BILITOT 0.81 05/09/2017 0844   GFRNONAA >60 01/14/2019 1042   GFRAA >60 01/14/2019 1042    No results found for: SPEP, UPEP  Lab Results  Component Value Date   WBC 3.3 (L) 01/14/2019   NEUTROABS 2.1 01/14/2019   HGB 10.2 (L) 01/14/2019   HCT 31.3 (L) 01/14/2019   MCV 95.1 01/14/2019   PLT 167 01/14/2019      Chemistry      Component Value Date/Time   NA 140 01/14/2019 1042   NA 140 05/09/2017 0844   K 4.4 01/14/2019 1042   K 4.1 05/09/2017 0844   CL 105 01/14/2019 1042   CO2 25 01/14/2019 1042   CO2 26 05/09/2017 0844   BUN 20 01/14/2019 1042   BUN 20.7 05/09/2017 0844   CREATININE 0.77 01/14/2019 1042   CREATININE 0.7 05/09/2017 0844      Component Value Date/Time   CALCIUM 9.6 01/14/2019 1042   CALCIUM 9.3 05/09/2017 0844   ALKPHOS 58 01/14/2019 1042   ALKPHOS 43 05/09/2017 0844   AST 24 01/14/2019 1042   AST 27 05/09/2017 0844   ALT 18 01/14/2019 1042   ALT 17 05/09/2017 0844   BILITOT 0.7 01/14/2019 1042   BILITOT 0.81 05/09/2017 0844       RADIOGRAPHIC STUDIES: I have personally reviewed the radiological images as listed and agreed with the findings in the report. Dg Chest 2 View  Result Date: 12/21/2018 CLINICAL DATA:  Lung cancer EXAM: CHEST - 2 VIEW COMPARISON:  December 11, 2018 FINDINGS: The left-sided Port-A-Cath is unchanged in positioning. There is a small to moderate-sized right-sided pleural effusion which appears to have increased in size from prior study. There is an opacity in the right mid lung zone which is similar to prior study. The lungs are somewhat hyperexpanded. There are emphysematous changes bilaterally. There may be a trace residual right apical pneumothorax. Surgical clips project over  the patient's right chest wall. There is no acute osseous abnormality. IMPRESSION: 1. Probable trace residual right apical pneumothorax. 2. Small to moderate size right-sided pleural effusion. 3. Stable appearance of the right mid lung zone. 4. Stable positioning of the left-sided Port-A-Cath. These results will be called to the ordering clinician or representative by the Radiologist Assistant, and communication documented in the PACS or zVision Dashboard. Electronically Signed   By: Constance Holster M.D.   On: 12/21/2018 15:07   Mr Jeri Cos RC Contrast  Result Date: 01/05/2019 CLINICAL DATA:  78 year old female with recently diagnosed lung cancer, non-small cell. Staging. History of meningioma. History also of metastatic endometrial carcinoma. EXAM: MRI HEAD WITHOUT AND WITH CONTRAST TECHNIQUE: Multiplanar, multiecho pulse sequences of the brain and surrounding structures were obtained without and with intravenous contrast. CONTRAST:  5 milliliters Gadavist COMPARISON:  PET-CT 11/11/2018. Report of brain MRI 12/12/2004 (no images available). FINDINGS: Brain: In the midline attached to the falx located below the superior sagittal sinus there is a 12 x 15 millimeter enhancing extra-axial mass compatible with the chronic meningioma described in 2006 (size unchanged since that time). No significant mass effect. No regional cerebral edema. No other abnormal intracranial enhancement or dural thickening identified. There is a normal sulcal variation suspected along the right superior frontal gyrus (series 6, image 22). No restricted diffusion to suggest acute infarction. No midline shift, mass effect, ventriculomegaly, extra-axial fluid collection or acute intracranial hemorrhage. Cervicomedullary junction and pituitary are within normal limits. There is patchy and widespread bilateral cerebral white matter T2 and FLAIR hyperintensity in both hemispheres. Mild to moderate patchy T2 heterogeneity in the pons. No  cortical encephalomalacia. No chronic blood products. The deep gray nuclei and cerebellum are within normal limits for age. Vascular: Major intracranial vascular flow voids are preserved, with generalized intracranial artery tortuosity. The major dural venous sinuses are enhancing and appear to be patent. Skull and upper cervical spine: Partially visible cervical spine disc and endplate degeneration. Mild degenerative spinal stenosis probably with mild spinal cord mass effect at C4-C5. Otherwise negative visible spinal cord. Visualized bone marrow signal is within normal limits. Sinuses/Orbits: Negative orbits. Mild or trace paranasal sinus mucosal thickening. Other: Visible internal auditory structures appear normal. Mastoids are clear. Scalp and face soft tissues appear negative. IMPRESSION: 1. No metastatic disease or acute intracranial abnormality identified. 2. Small chronic parafalcine meningioma size is stable since that described in 2006 (12 x 15 mm). 3. Advanced signal changes in the cerebral white matter and to a lesser extent pons, nonspecific but most commonly due to chronic small vessel disease. 4. Partially visible degenerative cervical spinal stenosis. Electronically Signed   By: Genevie Ann M.D.   On: 01/05/2019 16:36    All questions were answered. The patient knows to call the clinic with any problems, questions or concerns. No barriers to learning was detected.  I spent 15 minutes counseling the patient face to face. The total time spent in the appointment was 20 minutes and more than 50% was on counseling and review of test results  Heath Lark, MD 01/14/2019 12:53 PM

## 2019-01-14 NOTE — Assessment & Plan Note (Signed)
Clinically, she appears somewhat dehydrated We will proceed with fluid resuscitation I encouraged her to increase oral intake as tolerated

## 2019-01-14 NOTE — Assessment & Plan Note (Signed)
She has mild pancytopenia due to treatment Observe only.  She does not need transfusion support

## 2019-01-14 NOTE — Patient Instructions (Signed)

## 2019-01-19 ENCOUNTER — Ambulatory Visit (HOSPITAL_COMMUNITY): Payer: Medicare Other

## 2019-01-29 ENCOUNTER — Ambulatory Visit: Payer: Medicare Other

## 2019-01-29 ENCOUNTER — Other Ambulatory Visit: Payer: Medicare Other

## 2019-01-29 ENCOUNTER — Ambulatory Visit: Payer: Medicare Other | Admitting: Hematology and Oncology

## 2019-01-29 ENCOUNTER — Other Ambulatory Visit: Payer: Self-pay | Admitting: Hematology and Oncology

## 2019-02-01 ENCOUNTER — Inpatient Hospital Stay: Payer: Medicare Other

## 2019-02-01 ENCOUNTER — Encounter: Payer: Self-pay | Admitting: Hematology and Oncology

## 2019-02-01 ENCOUNTER — Other Ambulatory Visit: Payer: Self-pay

## 2019-02-01 ENCOUNTER — Telehealth: Payer: Self-pay | Admitting: Hematology and Oncology

## 2019-02-01 ENCOUNTER — Inpatient Hospital Stay (HOSPITAL_BASED_OUTPATIENT_CLINIC_OR_DEPARTMENT_OTHER): Payer: Medicare Other | Admitting: Hematology and Oncology

## 2019-02-01 DIAGNOSIS — E860 Dehydration: Secondary | ICD-10-CM | POA: Diagnosis not present

## 2019-02-01 DIAGNOSIS — Z23 Encounter for immunization: Secondary | ICD-10-CM | POA: Diagnosis not present

## 2019-02-01 DIAGNOSIS — D61818 Other pancytopenia: Secondary | ICD-10-CM | POA: Diagnosis not present

## 2019-02-01 DIAGNOSIS — C541 Malignant neoplasm of endometrium: Secondary | ICD-10-CM | POA: Diagnosis not present

## 2019-02-01 DIAGNOSIS — Z853 Personal history of malignant neoplasm of breast: Secondary | ICD-10-CM

## 2019-02-01 DIAGNOSIS — C549 Malignant neoplasm of corpus uteri, unspecified: Secondary | ICD-10-CM

## 2019-02-01 DIAGNOSIS — C7951 Secondary malignant neoplasm of bone: Secondary | ICD-10-CM

## 2019-02-01 DIAGNOSIS — C3491 Malignant neoplasm of unspecified part of right bronchus or lung: Secondary | ICD-10-CM

## 2019-02-01 DIAGNOSIS — Z95828 Presence of other vascular implants and grafts: Secondary | ICD-10-CM

## 2019-02-01 DIAGNOSIS — I10 Essential (primary) hypertension: Secondary | ICD-10-CM | POA: Diagnosis not present

## 2019-02-01 DIAGNOSIS — C3431 Malignant neoplasm of lower lobe, right bronchus or lung: Secondary | ICD-10-CM | POA: Diagnosis not present

## 2019-02-01 DIAGNOSIS — D6481 Anemia due to antineoplastic chemotherapy: Secondary | ICD-10-CM

## 2019-02-01 DIAGNOSIS — D701 Agranulocytosis secondary to cancer chemotherapy: Secondary | ICD-10-CM

## 2019-02-01 DIAGNOSIS — T451X5A Adverse effect of antineoplastic and immunosuppressive drugs, initial encounter: Secondary | ICD-10-CM

## 2019-02-01 LAB — CMP (CANCER CENTER ONLY)
ALT: 16 U/L (ref 0–44)
AST: 25 U/L (ref 15–41)
Albumin: 3.7 g/dL (ref 3.5–5.0)
Alkaline Phosphatase: 58 U/L (ref 38–126)
Anion gap: 9 (ref 5–15)
BUN: 21 mg/dL (ref 8–23)
CO2: 24 mmol/L (ref 22–32)
Calcium: 9.3 mg/dL (ref 8.9–10.3)
Chloride: 108 mmol/L (ref 98–111)
Creatinine: 0.83 mg/dL (ref 0.44–1.00)
GFR, Est AFR Am: >60 mL/min (ref >60)
GFR, Estimated: >60 mL/min (ref >60)
Glucose, Bld: 93 mg/dL (ref 70–99)
Potassium: 4.1 mmol/L (ref 3.5–5.1)
Sodium: 141 mmol/L (ref 135–145)
Total Bilirubin: 0.5 mg/dL (ref 0.3–1.2)
Total Protein: 6.9 g/dL (ref 6.5–8.1)

## 2019-02-01 LAB — CBC WITH DIFFERENTIAL (CANCER CENTER ONLY)
Abs Immature Granulocytes: 0.05 10*3/uL (ref 0.00–0.07)
Basophils Absolute: 0 10*3/uL (ref 0.0–0.1)
Basophils Relative: 1 %
Eosinophils Absolute: 0.1 10*3/uL (ref 0.0–0.5)
Eosinophils Relative: 1 %
HCT: 33.5 % — ABNORMAL LOW (ref 36.0–46.0)
Hemoglobin: 10.8 g/dL — ABNORMAL LOW (ref 12.0–15.0)
Immature Granulocytes: 1 %
Lymphocytes Relative: 19 %
Lymphs Abs: 1.4 10*3/uL (ref 0.7–4.0)
MCH: 30.8 pg (ref 26.0–34.0)
MCHC: 32.2 g/dL (ref 30.0–36.0)
MCV: 95.4 fL (ref 80.0–100.0)
Monocytes Absolute: 1.1 10*3/uL — ABNORMAL HIGH (ref 0.1–1.0)
Monocytes Relative: 14 %
Neutro Abs: 4.7 10*3/uL (ref 1.7–7.7)
Neutrophils Relative %: 64 %
Platelet Count: 208 10*3/uL (ref 150–400)
RBC: 3.51 MIL/uL — ABNORMAL LOW (ref 3.87–5.11)
RDW: 14.5 % (ref 11.5–15.5)
WBC Count: 7.3 10*3/uL (ref 4.0–10.5)
nRBC: 0 % (ref 0.0–0.2)

## 2019-02-01 LAB — SAMPLE TO BLOOD BANK

## 2019-02-01 MED ORDER — PALONOSETRON HCL INJECTION 0.25 MG/5ML
INTRAVENOUS | Status: AC
  Filled 2019-02-01: qty 5

## 2019-02-01 MED ORDER — PALONOSETRON HCL INJECTION 0.25 MG/5ML
0.2500 mg | Freq: Once | INTRAVENOUS | Status: AC
  Administered 2019-02-01: 0.25 mg via INTRAVENOUS

## 2019-02-01 MED ORDER — SODIUM CHLORIDE 0.9 % IV SOLN
Freq: Once | INTRAVENOUS | Status: AC
  Administered 2019-02-01: 12:00:00 via INTRAVENOUS
  Filled 2019-02-01: qty 250

## 2019-02-01 MED ORDER — INFLUENZA VAC A&B SA ADJ QUAD 0.5 ML IM PRSY
0.5000 mL | PREFILLED_SYRINGE | Freq: Once | INTRAMUSCULAR | Status: AC
  Administered 2019-02-01: 0.5 mL via INTRAMUSCULAR

## 2019-02-01 MED ORDER — SODIUM CHLORIDE 0.9 % IV SOLN
500.0000 mg/m2 | Freq: Once | INTRAVENOUS | Status: AC
  Administered 2019-02-01: 700 mg via INTRAVENOUS
  Filled 2019-02-01: qty 20

## 2019-02-01 MED ORDER — DIPHENHYDRAMINE HCL 50 MG/ML IJ SOLN
INTRAMUSCULAR | Status: AC
  Filled 2019-02-01: qty 1

## 2019-02-01 MED ORDER — DIPHENHYDRAMINE HCL 50 MG/ML IJ SOLN
25.0000 mg | Freq: Once | INTRAMUSCULAR | Status: AC
  Administered 2019-02-01: 25 mg via INTRAVENOUS

## 2019-02-01 MED ORDER — FAMOTIDINE IN NACL 20-0.9 MG/50ML-% IV SOLN
20.0000 mg | Freq: Once | INTRAVENOUS | Status: AC
  Administered 2019-02-01: 20 mg via INTRAVENOUS

## 2019-02-01 MED ORDER — INFLUENZA VAC A&B SA ADJ QUAD 0.5 ML IM PRSY
PREFILLED_SYRINGE | INTRAMUSCULAR | Status: AC
  Filled 2019-02-01: qty 0.5

## 2019-02-01 MED ORDER — SODIUM CHLORIDE 0.9% FLUSH
10.0000 mL | INTRAVENOUS | Status: DC | PRN
  Administered 2019-02-01: 15:00:00 10 mL
  Filled 2019-02-01: qty 10

## 2019-02-01 MED ORDER — FAMOTIDINE IN NACL 20-0.9 MG/50ML-% IV SOLN
INTRAVENOUS | Status: AC
  Filled 2019-02-01: qty 50

## 2019-02-01 MED ORDER — SODIUM CHLORIDE 0.9 % IV SOLN
Freq: Once | INTRAVENOUS | Status: AC
  Administered 2019-02-01: 12:00:00 via INTRAVENOUS
  Filled 2019-02-01: qty 5

## 2019-02-01 MED ORDER — SODIUM CHLORIDE 0.9% FLUSH
10.0000 mL | Freq: Once | INTRAVENOUS | Status: AC
  Administered 2019-02-01: 10 mL
  Filled 2019-02-01: qty 10

## 2019-02-01 MED ORDER — HEPARIN SOD (PORK) LOCK FLUSH 100 UNIT/ML IV SOLN
500.0000 [IU] | Freq: Once | INTRAVENOUS | Status: AC | PRN
  Administered 2019-02-01: 500 [IU]
  Filled 2019-02-01: qty 5

## 2019-02-01 MED ORDER — SODIUM CHLORIDE 0.9 % IV SOLN
302.0000 mg | Freq: Once | INTRAVENOUS | Status: AC
  Administered 2019-02-01: 300 mg via INTRAVENOUS
  Filled 2019-02-01: qty 30

## 2019-02-01 NOTE — Assessment & Plan Note (Signed)
She tolerated cycle 1 of treatment well without major side effects We will proceed with cycle 2 without dose adjustment

## 2019-02-01 NOTE — Telephone Encounter (Signed)
No los,sch msg

## 2019-02-01 NOTE — Assessment & Plan Note (Signed)
Her blood pressure is significantly elevated today due to anxiety She is not symptomatic Observe only for now

## 2019-02-01 NOTE — Progress Notes (Signed)
[place] OFFICE PROGRESS NOTE  Patient Care Team: Shon Baton, MD as PCP - General (Internal Medicine) Heath Lark, MD as Consulting Physician (Hematology and Oncology) Heath Lark, MD as Consulting Physician (Hematology and Oncology) Nancy Marus, MD as Attending Physician (Gynecologic Oncology)  ASSESSMENT & PLAN:  Primary lung cancer, right [facility]) She tolerated cycle 1 of treatment well without major side effects We will proceed with cycle 2 without dose adjustment   Pancytopenia, acquired [facility]) She has mild anemia She is not symptomatic She will continue high-dose folic acid and vitamin B12 per protocol  White coat syndrome with diagnosis of hypertension Her blood pressure is significantly elevated today due to anxiety She is not symptomatic Observe only for now   No orders of the defined types were placed in this encounter.   INTERVAL HISTORY: Please see below for problem oriented charting. She is seen prior to cycle 2 of treatment She feels well Denies side effects from treatment such as nausea or changes in bowel habits Her energy level is fair Denies recent cough, chest pain or shortness of breath  SUMMARY OF ONCOLOGIC HISTORY: Oncology History Overview Note  Biopsy of recurrence 06-2014 ER negative. PDL1 testing low at 2% on the uterine cancer HER 2 negative on pathology [id number] Negative genetic testing  On her lung cancer sample, [employer] testing revealed 10% PD-1 positive EGFR mutation was detected, exon 19 deletion   Malignant neoplasm of corpus uteri, except isthmus ([place])  12/22/2007 Surgery   Staging for IA UPSC    - 05/28/2008 Chemotherapy   6 cycles of paclitaxel and carboplatin with vaginal cuff brachytherapy   03/22/2011 Relapse/Recurrence   Left pelvic node recurrence   04/25/2011 Surgery   L/S evaluation, not resectable.   05/16/2011 - 09/25/2011 Chemotherapy   6 cyclces paclitaxel and carboplatin    - 12/27/2011  Radiation Therapy   radiation completed   03/10/2013 Progression   progression left pelvic mass   04/02/2013 Surgery   Resection and IORT   06/13/2014 Relapse/Recurrence   soft tissue mass at L3-4   07/11/2014 - 08/12/2014 Radiation Therapy   IMRT + zometa   02/12/2016 Progression      - 01/18/2016 Chemotherapy   The patient had ondansetron (ZOFRAN) IVPB 16 mg, 16 mg (100 % of original dose 16 mg), Intravenous,  Once, 5 of 5 cycles Dose modification: 16 mg (original dose 16 mg, Cycle 2)  CARBOplatin (PARAPLATIN) 390 mg in sodium chloride 0.9 % 100 mL chemo infusion, 390 mg (100 % of original dose 392 mg), Intravenous,  Once, 5 of 5 cycles Dose modification: 392 mg (original dose 392 mg, Cycle 1), 446.5 mg (original dose 392 mg, Cycle 2), 390 mg (original dose 392 mg, Cycle 2), 342 mg (original dose 392 mg, Cycle 6)  CARBOplatin chemo intradermal Test Dose 100 mcg, 0.1 mg, Intradermal,  Once, 5 of 5 cycles  PACLitaxel (TAXOL) 252 mg in dextrose 5 % 250 mL chemo infusion (> '80mg'$ /m2), 175 mg/m2 = 252 mg, Intravenous,  Once, 6 of 6 cycles Dose modification: 135 mg/m2 (original dose 175 mg/m2, Cycle 5)  CARBOplatin (PARAPLATIN) 250 mg in sodium chloride 0.9 % 100 mL chemo infusion, 250 mg (100 % of original dose 254.4 mg), Intravenous,  Once, 6 of 6 cycles Dose modification:   (original dose 254.4 mg, Cycle 1)  PACLitaxel (TAXOL) 114 mg in dextrose 5 % 250 mL chemo infusion ( ondansetron (ZOFRAN) 16 mg, dexamethasone (DECADRON) 20 mg in sodium chloride 0.9 %  50 mL IVPB, , Intravenous,  Once, 6 of 6 cycles  for chemotherapy treatment.     01/07/2019 -  Chemotherapy   The patient had palonosetron (ALOXI) injection 0.25 mg, 0.25 mg, Intravenous,  Once, 2 of 4 cycles Administration: 0.25 mg (01/07/2019) PEMEtrexed (ALIMTA) 700 mg in sodium chloride 0.9 % 100 mL chemo infusion, 500 mg/m2 = 700 mg, Intravenous,  Once, 2 of 4 cycles Administration: 700 mg (01/07/2019) CARBOplatin (PARAPLATIN) 300  mg in sodium chloride 0.9 % 250 mL chemo infusion, 300 mg (100 % of original dose 304.5 mg), Intravenous,  Once, 2 of 4 cycles Dose modification:   (original dose 304.5 mg, Cycle 1) Administration: 300 mg (01/07/2019) fosaprepitant (EMEND) 150 mg, dexamethasone (DECADRON) 12 mg in sodium chloride 0.9 % 145 mL IVPB, , Intravenous,  Once, 2 of 4 cycles Administration:  (01/07/2019)  for chemotherapy treatment.    Endometrial cancer ([place])  12/01/2007 Initial Diagnosis   She has recurrent papillary serous endometrial carcinoma. Briefly she was diagnosed in 2009 with a FIGO IB pappilary serous carcinoma treated with hysterectomy followed by adjuvant chemo and vaginal brachytherapy. She then had a recurrence diagnosed in late 2012 that was deemed not to be surgically resectable, and was treated with 6 cycles of carbo/taxol followed by 5040 cGy of EBRT to the pelvic mass. She was then followed with serial imaging and was noted in June of this year to have slight increase in the size of the mass, and again in September there was small enlargement of the mass. She had a re-staging PET scan on 03/10/13 that showed FDG activity in the pelvic mass, with no other areas of disease   12/01/2007 Imaging   CT scan of chest, abdomen and pelvis: 1.  Mass like area of decreased attenuation in the central uterus, consistent with the known endometrial carcinoma.  Deep myometrial invasion is suspected.  Pelvic MRI without and with contrast could be performed for further staging workup if clinically warranted. 2.  No evidence of extrauterine extension or lymphadenopathy. 3.  Sigmoid diverticulosis incidentally noted.   12/15/2007 Surgery   --12/2007 laparoscopic hysterectomy and PLND --05/2008 complted 6 cycles adjuvant carbo/taxol followed by vaginal cuff brachy --03/2011 exploratory lararoscopy, ureterolysis --4/13 completed 6 cycels carbotaxol --8/13 completed EBRT to the left pelvic sidewall    06/10/2008 Imaging   CT  scan abdomen and pelvis 1.  Nonspecific mildly prominent inguinal lymph nodes, right greater than left. 2.  Interval hysterectomy without evidence of recurrent pelvic mass or fluid collection.   03/22/2011 Imaging   Ct scan abdomen and pelvis 1.  Local uterine cancer recurrence with two large nodes along the left pelvic sidewall. 2.  No evidence of bowel obstruction, urinary obstruction, or more distant metastasis.   03/31/2011 Relapse/Recurrence   chemotherapy and external beam radiation   05/16/2011 - 09/17/2011 Chemotherapy   She had 6 cycles of carboplatin and Taxol    07/17/2011 Imaging   Ct scan abdomen and pelvis 1.  Interval improvement in the previously demonstrated left pelvic local recurrence of tumor. 2.  No disease progression or complication identified. 3.  Mild bladder wall thickening on the right, nonspecific and possibly related to incomplete distension and/or radiation therapy. 4.  Cholelithiasis   10/11/2011 Imaging   CT scan abdomen and pelvis 1.  Interval enlargement of the left pelvic sidewall masses. 2.  No new nodular disease in the pelvis or lymphadenopathy. 3.  No evidence  of distant metastasis. 4.  Mild hydronephrosis on the right  is similar to prior. 5.  Focal thickening of the right aspect the bladder is stable compared to prior.    10/28/2011 - 12/05/2011 Radiation Therapy   radiation for pelvic sidewall recurrence   10/28/2011 - 12/05/2011 Radiation Therapy   10/28/11-12/05/11: Radiotherapy to the left pelvic sidewall   03/18/2012 Imaging   CT abdomen 1.  Today's study demonstrates a positive response to therapy with decreased size of left pelvic sidewall nodal masses, as detailed above.  No new soft tissue masses or new lymphadenopathy is identified within the abdomen or pelvis. Continue attention on follow-up studies is recommended. 2.  Cholelithiasis without findings to suggest acute cholecystitis. 3.  Colonic diverticulosis without findings to suggest  acute diverticulitis at this time. 4.  Mild asymmetric urinary bladder wall thickening is unchanged compared to prior examinations and is nonspecific.  No definite bladder wall mass is identified at this time. 5.  Additional findings, similar to prior examinations, as above   06/22/2012 Imaging   CT abdomen 1.  Further decreased size of the left pelvic peripherally enhancing lesion. 2.  No new sites of new or progressive disease. 3. Esophageal air fluid level suggests dysmotility or gastroesophageal reflux. 4.  Cholelithiasis   11/02/2012 Imaging   CT abdomen 1.  The left pelvic side wall lesion demonstrates mild increase in size from previous exam. 2.  No new sites of new or progressive disease. 3.  Cholelithiasis.    01/28/2013 Imaging   CT abdomen 1. Interval small increase in volume of enhancing mass adjacent to the left pelvic sidewall. 2. No evidence of abdominal or pelvic lymphadenopathy   04/02/2013 Surgery   pelvic mass resection with IORT   04/02/2013 - 04/02/2013 Radiation Therapy   04/02/13: Intraoperative radiotherapy to the left pelvis   05/10/2013 PET scan   1. Hypermetabolic mass in the deep left pelvis consistent with metastasis. 2. No additional evidence of local metastasis. No evidence of distant metastasis. 3. Small right lower lobe pulmonary nodule is not hypermetabolic.   07/27/2013 Imaging   No evidence of recurrent or metastatic disease. Apparent prior resection of the deep left pelvic metastasis.   02/08/2014 Imaging   No CT findings for recurrent or metastatic disease involving the abdomen/ pelvis   06/13/2014 Relapse/Recurrence   + recurrence paraspinous muscle   06/21/2014 Procedure   There is a soft tissue lesion along the left side of L3-L4. This lesion roughly measures up to 2.2 cm. Needle was positioned along the posterior aspect of the lesion. Small amount of air in the paraspinal tissues following needle removal   06/21/2014 Pathology Results    Bone, biopsy, left lumbar paraspinal - METASTATIC POORLY DIFFERENTIATED CARCINOMA, CONSISTENT WITH HIGH GRADE SEROUS CARCINOMA SEE COMMENT. Microscopic Comment The carcinoma demonstrates the following immunophenotype: Cytokeratin 7 - patchy moderate to strong expression Estrogen receptor - negative expression P53 - strong diffuse expression TTF-1 - negative expression WT-1 - negative expression CD56 - focal moderate strong expression Synaptophysin - negative expression GCDFP - negative expression The history of primary endometrial papillary serous carcinoma and primary mammary carcinoma is noted. In the current case, the overall morphologic and immunophenotype are that of poorly differentiated carcinoma, consistent with high grade serous carcinoma.    07/11/2014 - 08/12/2014 Radiation Therapy   07/11/14-08/12/14: 55 Gy in 25 fractions to the Lumbar spine   08/15/2014 Imaging   CT scan of chest, abdomen and pelvis 1. Since the biopsy study of 06/21/2014, similar to slight decrease in size of a left paraspinous lesion  at L3-4. 2. No new sites of metastatic disease identified. 3. 8 mm ground-glass nodule in the right lower lobe is grossly similar to 03/10/2013, suggesting a benign etiology. 4. Cholelithiasis. 5. Apparent sigmoid colonic wall thickening which could be due to underdistention. Colitis felt less likely. 6.  Atherosclerosis, including within the coronary arteries. 7. Pelvic floor laxity.   10/07/2014 Imaging   CT scan of chest, abdomen and pelvis: Stable to slight interval decrease in size of left paraspinous lesion at L3-4. No new sites of metastatic disease. Unchanged ground-glass nodule within the right lower lobe, potentially benign in etiology. Recommend attention on followup. Cholelithiasis. Sigmoid colonic diverticulosis. No CT evidence to suggest acute diverticulitis.   01/19/2015 Imaging   Ct scan of abdomen and pelvis 1. Decrease in size of left paraspinous metastasis. 2.  No new sites of disease. 3. Stable ground-glass attenuating nodule within the superior segment of right lower lobe. 4. Gallstones   05/02/2015 Imaging   Ct abdomen and pelvis: Slight interval increase in size of left lumbar paraspinal soft tissue metastasis. No new sites of metastatic disease identified within the abdomen or pelvis.    05/11/2015 PET scan   1. The small soft tissue mass just lateral to the left L3 for neural foramen is hypermetabolic, favoring malignancy. 2. There is also a small focus of hypermetabolic activity between the left L1 and L2 transverse processes, but without a CT correlate. 3. The 13 mm sub solid nodule in the superior segment right lower lobe is stable from recent exams but has slowly increased in size over the last 7 years. Although not hypermetabolic, the appearance is concerning for the possibility of a low grade adenocarcinoma. 4. Other imaging findings of potential clinical significance: Chronic bilateral maxillary sinusitis. Coronary, aortic arch, and branch vessel atherosclerotic vascular disease. Aortoiliac atherosclerotic vascular disease. Cholelithiasis. Sigmoid colon diverticulosis. Lumbar scoliosis.   07/11/2015 Imaging   Ct scan of chest, abdomen and pelvis: 1. 13 mm mixed solid and sub solid nodule in the posterior right lower lobe was present in 2009 and has clearly progressed in the interval since that study. Imaging features remain highly concerning for low-grade or well differentiated adenocarcinoma. 2. Slight increase size of in the hypermetabolic, rim enhancing nodule identified adjacent to the left L3-4 neural foramen. Metastatic disease remains a concern. 3. No evidence for discrete soft tissue lesion between the left L1 and 2 transverse processes, the site of focal FDG uptake on the recent PET-CT. 4. Cholelithiasis. 5. Abdominal aortic atherosclerosis   08/03/2015 - 01/16/2016 Chemotherapy   She received carboplatin and Taxol   11/06/2015  PET scan   1. Hypermetabolic left V0-3 paraspinous metastasis (biopsy-proven) is stable in size and slightly decreased in metabolism. 2. Previously described small focus of hypermetabolism between the left L2 and L3 spinous processes has resolved. 3. New mild linear hypermetabolism to the left of the T11-12 spinous processes without discrete mass on the CT images, favor benign activity related activity, recommend attention on follow-up PET-CT.  4. No definite new sites of hypermetabolic metastatic disease. No recurrent hypermetabolic metastatic disease in the pelvis. 5. Interval stability of subsolid 1.3 cm superior segment right lower lobe pulmonary nodule without associated significant metabolism, which has grown compared to the 2009 chest CT study, and remain suspicious for low grade adenocarcinoma. 6. Additional findings include aortic atherosclerosis, coronary atherosclerosis, mild multinodular goiter with no hypermetabolic thyroid nodules, cholelithiasis and moderate sigmoid diverticulosis.   02/12/2016 PET scan   1. Interval increase in size and  metabolic activity of the LEFT paraspinal soft tissue metastasis. 2. New activity within the musculature of the LEFT chest wall. Given the unusual location of the paraspinal metastasis cannot exclude a second soft tissue metastasis to the musculature however favor benign physiologic activity. 3. Stable RIGHT lower lobe pulmonary nodule. 4. No evidence of local recurrence.     04/08/2016 Imaging   Ct scan of chest, abdomen and pelvis: 1. Similar size of a  left paravertebral abdominal lesion. 2. No new or progressive metastatic disease. 3. No correlate for the muscular activity about the lateral left chest wall. 4.  Coronary artery atherosclerosis. Aortic atherosclerosis. 5. Cholelithiasis. 6. Similar right lower lobe pulmonary nodule.   06/03/2016 Imaging   CT abdomen and pelvis 1. Similar to mild enlargement of a paravertebral soft tissue  lesion at the L3-4 level. 2. No new sites of disease identified. 3. Cholelithiasis. 4. Hysterectomy. 5.  Aortic atherosclerosis.    09/02/2016 Imaging   CT abdomen and pelvis 1. Continue mild interval increase in size of LEFT paraspinal mass (1-2 mm). 2. No evidence of new metastatic disease in the abdomen pelvis. 3. Post hysterectomy anatomy   09/26/2016 Imaging   MR lumbar spine 1. Left paraspinal metastasis with epicenter adjacent to the left L3 neural foramen does extend into the foramen to the level of the left lateral epidural space (series 9, image 31), but also tracks cephalad and caudal along the L2-L4 lumbar plexus (series 10, image 15). No extension into the left L2 or L4 neural foramina. And no more distal extension of tumor. 2. Abnormal signal in the medial left psoas and ventral left erector spinae muscles at L2 and L3 is probably denervation related. 3. No bone invasion or osseous metastatic disease. Suspect previous radiation of the L2 through L5 spinal levels. 4. No dural or intradural metastatic disease.    10/22/2016 Procedure   She underwent stereotactic Radiosurgery   10/30/2016 Genetic Testing   Patient has genetic testing done for Inheritable genetic mutation panel. Results revealed patient has no actionable mutation.   01/21/2017 Imaging   1. Reduced size and conspicuity of the left paraspinal mass at the L3-4 level. The mass still present but has enhancement similar to that of adjacent psoas musculature. 2. No new metastatic disease is identified. 3. Other imaging findings of potential clinical significance: Cholelithiasis. Aortic Atherosclerosis (ICD10-I70.0). Sigmoid diverticulosis. Lumbar scoliosis.   05/09/2017 Imaging   Ct scan of abdomen and pelvis 1. Paraspinal mass adjacent to the left side of L3-L4 is less distinct than prior examinations, and is therefore difficult to discretely measure, however, the overall appearance suggests continued positive  response to therapy. 2. No new signs of metastatic disease elsewhere in the abdomen or pelvis. 3. Aortic atherosclerosis, in addition to at least right coronary artery disease. Assessment for potential risk factor modification, dietary therapy or pharmacologic therapy may be warranted, if clinically indicated. 4. Colonic diverticulosis without evidence of acute diverticulitis at this time. 5. Additional incidental findings, as above. Aortic Atherosclerosis (ICD10-I70.0).   08/14/2017 Imaging   1. Treated left paraspinal mass centered at L3-4. Mass size is stable but there has been some evolution of tumor characteristics with less central fluid seen today. Recommend continued surveillance. 2. No evidence of untreated metastasis. 3. Degenerative changes and related impingement are described above.   10/28/2017 Imaging   Stable small left paraspinal soft tissue mass. No new or progressive disease within the abdomen or pelvis.  Cholelithiasis.  No radiographic evidence of cholecystitis.  Colonic  diverticulosis, without radiographic evidence of diverticulitis.   04/30/2018 Imaging   Stable post treatment change of the partially necrotic LEFT paravertebral mass at L3-L4. 18 x 21 mm cross-section. Mass effect on the LEFT L3 nerve root redemonstrated. Enhancing and atrophic LEFT psoas is inseparable.   10/15/2018 Imaging   1. Stable post treatment size and appearance of partially necrotic left paravertebral mass at L3-4, measuring 19 x 25 mm on current exam (unchanged when measured at similar level on previous study). Mass effect on the adjacent left L3 nerve root is unchanged. Adjacent enhancing and atrophic left psoas muscle. 2. Left convex scoliosis with associated multilevel degenerative spondylolysis and facet arthrosis, unchanged   10/29/2018 Imaging   Stable small left L3 paraspinal soft tissue mass. No evidence of new or progressive metastatic disease, or other acute findings.    Cholelithiasis.  No radiographic evidence of cholecystitis.   Colonic diverticulosis, without radiographic evidence of diverticulitis.   11/11/2018 PET scan   1. Low level metabolism (max SUV 2.0) associated with the irregular subsolid superior segment right lower lobe 2.1 cm pulmonary nodule. As better depicted on the recent diagnostic chest CT study, this nodule crosses the major fissure into the right upper lobe and has increased in size and density on multiple imaging studies back to 2009. Findings are most suggestive of primary bronchogenic adenocarcinoma. 2. No hypermetabolic thoracic adenopathy. 3. Persistent hypermetabolism (max SUV 8.0) associated with the known left L3-4 paraspinous metastasis, mildly decreased in metabolism since 02/12/2016 PET-CT. 4. No additional sites of hypermetabolic metastatic disease in the abdomen or pelvis. No metabolic evidence of peritoneal recurrence. No ascites. 5. Chronic findings include: Aortic Atherosclerosis (ICD10-I70.0). Coronary atherosclerosis. Chronic bilateral maxillary sinusitis. Cholelithiasis. Moderate sigmoid diverticulosis.   01/07/2019 -  Chemotherapy   The patient had palonosetron (ALOXI) injection 0.25 mg, 0.25 mg, Intravenous,  Once, 2 of 4 cycles Administration: 0.25 mg (01/07/2019) PEMEtrexed (ALIMTA) 700 mg in sodium chloride 0.9 % 100 mL chemo infusion, 500 mg/m2 = 700 mg, Intravenous,  Once, 2 of 4 cycles Administration: 700 mg (01/07/2019) CARBOplatin (PARAPLATIN) 300 mg in sodium chloride 0.9 % 250 mL chemo infusion, 300 mg (100 % of original dose 304.5 mg), Intravenous,  Once, 2 of 4 cycles Dose modification:   (original dose 304.5 mg, Cycle 1) Administration: 300 mg (01/07/2019) fosaprepitant (EMEND) 150 mg, dexamethasone (DECADRON) 12 mg in sodium chloride 0.9 % 145 mL IVPB, , Intravenous,  Once, 2 of 4 cycles Administration:  (01/07/2019)  for chemotherapy treatment.    Metastatic cancer to bone ([place])  06/27/2014 Initial  Diagnosis   Metastatic cancer to bone   Primary lung cancer, right ([place])  11/04/2018 Initial Diagnosis   Primary lung cancer, right ([place])   11/11/2018 PET scan   1. Low level metabolism (max SUV 2.0) associated with the irregular subsolid superior segment right lower lobe 2.1 cm pulmonary nodule. As better depicted on the recent diagnostic chest CT study, this nodule crosses the major fissure into the right upper lobe and has increased in size and density on multiple imaging studies back to 2009. Findings are most suggestive of primary bronchogenic adenocarcinoma. 2. No hypermetabolic thoracic adenopathy. 3. Persistent hypermetabolism (max SUV 8.0) associated with the known left L3-4 paraspinous metastasis, mildly decreased in metabolism since 02/12/2016 PET-CT. 4. No additional sites of hypermetabolic metastatic disease in the abdomen or pelvis. No metabolic evidence of peritoneal recurrence. No ascites. 5. Chronic findings include: Aortic Atherosclerosis (ICD10-I70.0). Coronary atherosclerosis. Chronic bilateral maxillary sinusitis. Cholelithiasis. Moderate sigmoid diverticulosis.  12/07/2018 Pathology Results   1. Lung, resection (segmental or lobe), Right Lobe Superior with endblock wedge of right upper lobe - INVASIVE ADENOCARCINOMA, MODERATELY DIFFERENTIATED, SPANNING 1.6 CM. - THE SURGICAL RESECTION MARGINS ARE NEGATIVE FOR CARCINOMA. - SEE ONCOLOGY TABLE BELOW. 2. Lymph node, biopsy, Level 9 #1 - THERE IS NO EVIDENCE OF CARCINOMA IN 1 OF 1 LYMPH NODE (0/1). 3. Lymph node, biopsy, Level 9 #2 - THERE IS NO EVIDENCE OF CARCINOMA IN 1 OF 1 LYMPH NODE (0/1). 4. Lymph node, biopsy, Level 7 #1 - METASTATIC CARCINOMA IN 1 OF 1 LYMPH NODE (1/1). 5. Lymph node, biopsy, Level 7 #2 - THERE IS NO EVIDENCE OF CARCINOMA IN 1 OF 1 LYMPH NODE (0/1). 6. Lymph node, biopsy, Level 12 #1 - THERE IS NO EVIDENCE OF CARCINOMA IN 1 OF 1 LYMPH NODE (0/1). 7. Lymph node, biopsy, Level 12 #2 - THERE IS NO  EVIDENCE OF CARCINOMA IN 1 OF 1 LYMPH NODE (0/1). 8. Lymph node, biopsy, Level 10 #1 - THERE IS NO EVIDENCE OF CARCINOMA IN 1 OF 1 LYMPH NODE (0/1). 9. Lymph node, biopsy, Level 4R #1 - THERE IS NO EVIDENCE OF CARCINOMA IN 1 OF 1 LYMPH NODE (0/1). 10. Lymph node, biopsy, Level 4R #2 - THERE IS NO EVIDENCE OF CARCINOMA IN 1 OF 1 LYMPH NODE (0/1). 11. Lymph node, biopsy, Level 2R #1 - THERE IS NO EVIDENCE OF CARCINOMA IN 1 OF 1 LYMPH NODE (0/1). 12. Lung, resection (segmental or lobe), Right Lobe Superior - BENIGN LUNG PARENCHYMA. - THERE IS NO EVIDENCE OF MALIGNANCY. Procedure: Segmentectomy. Specimen Laterality: Right. Tumor Site: Right superior lobe. Tumor Size: 1.6 cm (gross measurement). Tumor Focality: Unifocal. Histologic Type: Adenocarcinoma, moderately differentiated. Visceral Pleura Invasion: Not identified. Lymphovascular Invasion: Not identified. Direct Invasion of Adjacent Structures: Not identified. Margins: Negative for carcinoma. Treatment Effect: N/A Regional Lymph Nodes: Number of Lymph Nodes Involved: 1 (level 7) Number of Lymph Nodes Examined: 10 Pathologic Stage Classification (pTNM, AJCC 8th Edition): pT1b, pN2 Ancillary Studies: The tumor cells are positive for Napsin-A, TTF-1, and p53. They are essentially negative for estrogen receptor, PAX-8 and progesterone receptor. Additional studies can be performed upon clinician request. Representative Tumor Block: 1D-1E. (JBK:gt, 12/09/18)   12/07/2018 Surgery   PREOPERATIVE DIAGNOSIS:  Right lung nodule.   POSTOPERATIVE DIAGNOSIS:  Non-small cell carcinoma- suspected primary lung carcinoma, clinical stage T2N01b.   PROCEDURE:   Right video-assisted thoracoscopy, Right lower lobe superior segmentectomy with en bloc wedge resection of right upper lobe, Lymph node dissection, Intercostal nerve block levels 3-9.   SURGEON:  Modesto Charon, MD   FINDINGS:  Mass palpable in posterior aspect of superior segment of  right lower lobe, likely involvement across fissure into the upper lobe.  Frozen section revealed non-small cell carcinoma. upper and lower lobe margins clear.   12/23/2018 Cancer Staging   Staging form: Lung, AJCC 8th Edition - Pathologic: Stage IIIA (pT1b, pN2, cM0) - Signed by Heath Lark, MD on 12/23/2018   01/05/2019 Imaging   MRI brain 1. No metastatic disease or acute intracranial abnormality identified. 2. Small chronic parafalcine meningioma size is stable since that described in 2006 (12 x 15 mm). 3. Advanced signal changes in the cerebral white matter and to a lesser extent pons, nonspecific but most commonly due to chronic small vessel disease. 4. Partially visible degenerative cervical spinal stenosis.     01/07/2019 -  Chemotherapy   The patient had carboplatin and Alimta for chemotherapy treatment.  REVIEW OF SYSTEMS:   Constitutional: Denies fevers, chills or abnormal weight loss Eyes: Denies blurriness of vision Ears, nose, mouth, throat, and face: Denies mucositis or sore throat Respiratory: Denies cough, dyspnea or wheezes Cardiovascular: Denies palpitation, chest discomfort or lower extremity swelling Gastrointestinal:  Denies nausea, heartburn or change in bowel habits Skin: Denies abnormal skin rashes Lymphatics: Denies new lymphadenopathy or easy bruising Neurological:Denies numbness, tingling or new weaknesses Behavioral/Psych: Mood is stable, no new changes  All other systems were reviewed with the patient and are negative.  I have reviewed the past medical history, past surgical history, social history and family history with the patient and they are unchanged from previous note.  ALLERGIES:  is allergic to ace inhibitors; codeine; demerol  [meperidine hcl]; and dilaudid [hydromorphone hcl].  MEDICATIONS:  Current Outpatient Medications  Medication Sig Dispense Refill  . amLODipine (NORVASC) 5 MG tablet Take 5 mg by mouth daily.    Marland Kitchen atenolol  (TENORMIN) 25 MG tablet Take 25 mg by mouth 2 (two) times daily.     . Biotin 5000 MCG TABS Take 5,000 mcg by mouth daily.    . folic acid (FOLVITE) 1 MG tablet Take 1 tablet (1 mg total) by mouth daily. Start 5-7 days before Alimta chemotherapy. Continue until 21 days after Alimta completed. 100 tablet 3  . gabapentin (NEURONTIN) 100 MG capsule TAKE 2 3 CAPSULES AT NIGHT    . lidocaine-prilocaine (EMLA) cream Apply to [facility] cath 1-2 hours prior to access as directed (Patient not taking: Reported on 12/01/2018) 30 g 1  . LORazepam (ATIVAN) 0.5 MG tablet 1 tablet po 30 minutes prior to radiation or MRI 30 tablet 0  . Magnesium 250 MG TABS Take 250 mg by mouth daily.     . Multiple Vitamin (MULTIVITAMIN WITH MINERALS) TABS tablet Take 1 tablet by mouth daily.    . ondansetron (ZOFRAN) 8 MG tablet Take 1 tablet (8 mg total) by mouth every 8 (eight) hours as needed (Nausea or vomiting). Start if needed on the third day after chemotherapy. 30 tablet 1  . potassium gluconate 595 MG TABS tablet Take 595 mg by mouth daily.     . prochlorperazine (COMPAZINE) 10 MG tablet Take 1 tablet (10 mg total) by mouth every 6 (six) hours as needed (Nausea or vomiting). 30 tablet 1  . simvastatin (ZOCOR) 20 MG tablet Take 20 mg by mouth every evening.     . traMADol (ULTRAM) 50 MG tablet Take 1 tablet (50 mg total) by mouth every 6 (six) hours as needed (mild pain). 30 tablet 0  . TURMERIC PO Take 1 capsule by mouth daily.     No current facility-administered medications for this visit.    Facility-Administered Medications Ordered in Other Visits  Medication Dose Route Frequency Provider Last Rate Last Dose  . CARBOplatin (PARAPLATIN) 300 mg in sodium chloride 0.9 % 250 mL chemo infusion  300 mg Intravenous Once Alvy Bimler, Bryon Parker, MD      . diphenhydrAMINE (BENADRYL) injection 25 mg  25 mg Intravenous Once Alvy Bimler, Iria Jamerson, MD      . famotidine (PEPCID) IVPB 20 mg premix  20 mg Intravenous Once Alvy Bimler, Garrin Kirwan, MD      .  fosaprepitant (EMEND) 150 mg, dexamethasone (DECADRON) 12 mg in sodium chloride 0.9 % 145 mL IVPB   Intravenous Once Alvy Bimler, Deyja Sochacki, MD      . heparin lock flush 100 unit/mL  500 Units Intracatheter Once Heath Lark, MD      . heparin  lock flush 100 unit/mL  500 Units Intracatheter Once PRN Alvy Bimler, Adonijah Baena, MD      . influenza vaccine adjuvanted (FLUAD) injection 0.5 mL  0.5 mL Intramuscular Once Alvy Bimler, Laylonie Marzec, MD      . palonosetron (ALOXI) injection 0.25 mg  0.25 mg Intravenous Once Alvy Bimler, Barbera Perritt, MD      . PEMEtrexed (ALIMTA) 700 mg in sodium chloride 0.9 % 100 mL chemo infusion  500 mg/m2 (Treatment Plan Recorded) Intravenous Once Brandace Cargle, MD      . sodium chloride flush (NS) 0.9 % injection 10 mL  10 mL Intracatheter Once Alvy Bimler, Mihran Lebarron, MD      . sodium chloride flush (NS) 0.9 % injection 10 mL  10 mL Intracatheter PRN Alvy Bimler, Teo Moede, MD        PHYSICAL EXAMINATION: ECOG PERFORMANCE STATUS: 1 - Symptomatic but completely ambulatory  Vitals:   02/01/19 1103  BP: (!) 169/72  Pulse: 69  Resp: 17  Temp: 98.3 F (36.8 C)  SpO2: 100%   Filed Weights   02/01/19 1103  Weight: 108 lb 12.8 oz (49.4 kg)    GENERAL:alert, no distress and comfortable SKIN: skin color, texture, turgor are normal, no rashes or significant lesions EYES: normal, Conjunctiva are pink and non-injected, sclera clear OROPHARYNX:no exudate, no erythema and lips, buccal mucosa, and tongue normal  NECK: supple, thyroid normal size, non-tender, without nodularity LYMPH:  no palpable lymphadenopathy in the cervical, axillary or inguinal LUNGS: clear to auscultation and percussion with normal breathing effort HEART: regular rate & rhythm and no murmurs and no lower extremity edema ABDOMEN:abdomen soft, non-tender and normal bowel sounds Musculoskeletal:no cyanosis of digits and no clubbing  NEURO: alert & oriented x 3 with fluent speech, no focal motor/sensory deficits  LABORATORY DATA:  I have reviewed the data as listed     Component Value Date/Time   NA 141 02/01/2019 1042   NA 140 05/09/2017 0844   K 4.1 02/01/2019 1042   K 4.1 05/09/2017 0844   CL 108 02/01/2019 1042   CO2 24 02/01/2019 1042   CO2 26 05/09/2017 0844   GLUCOSE 93 02/01/2019 1042   GLUCOSE 82 05/09/2017 0844   BUN 21 02/01/2019 1042   BUN 20.7 05/09/2017 0844   CREATININE 0.83 02/01/2019 1042   CREATININE 0.7 05/09/2017 0844   CALCIUM 9.3 02/01/2019 1042   CALCIUM 9.3 05/09/2017 0844   PROT 6.9 02/01/2019 1042   PROT 6.8 05/09/2017 0844   ALBUMIN 3.7 02/01/2019 1042   ALBUMIN 3.6 05/09/2017 0844   AST 25 02/01/2019 1042   AST 27 05/09/2017 0844   ALT 16 02/01/2019 1042   ALT 17 05/09/2017 0844   ALKPHOS 58 02/01/2019 1042   ALKPHOS 43 05/09/2017 0844   BILITOT 0.5 02/01/2019 1042   BILITOT 0.81 05/09/2017 0844   GFRNONAA >60 02/01/2019 1042   GFRAA >60 02/01/2019 1042    No results found for: SPEP, UPEP  Lab Results  Component Value Date   WBC 7.3 02/01/2019   NEUTROABS 4.7 02/01/2019   HGB 10.8 (L) 02/01/2019   HCT 33.5 (L) 02/01/2019   MCV 95.4 02/01/2019   PLT 208 02/01/2019      Chemistry      Component Value Date/Time   NA 141 02/01/2019 1042   NA 140 05/09/2017 0844   K 4.1 02/01/2019 1042   K 4.1 05/09/2017 0844   CL 108 02/01/2019 1042   CO2 24 02/01/2019 1042   CO2 26 05/09/2017 0844   BUN 21 02/01/2019  1042   BUN 20.7 05/09/2017 0844   CREATININE 0.83 02/01/2019 1042   CREATININE 0.7 05/09/2017 0844      Component Value Date/Time   CALCIUM 9.3 02/01/2019 1042   CALCIUM 9.3 05/09/2017 0844   ALKPHOS 58 02/01/2019 1042   ALKPHOS 43 05/09/2017 0844   AST 25 02/01/2019 1042   AST 27 05/09/2017 0844   ALT 16 02/01/2019 1042   ALT 17 05/09/2017 0844   BILITOT 0.5 02/01/2019 1042   BILITOT 0.81 05/09/2017 0844       RADIOGRAPHIC STUDIES: I have personally reviewed the radiological images as listed and agreed with the findings in the report. Mr Jeri Cos Wo Contrast  Result Date:  01/05/2019 CLINICAL DATA:  78 year old female with recently diagnosed lung cancer, non-small cell. Staging. History of meningioma. History also of metastatic endometrial carcinoma. EXAM: MRI HEAD WITHOUT AND WITH CONTRAST TECHNIQUE: Multiplanar, multiecho pulse sequences of the brain and surrounding structures were obtained without and with intravenous contrast. CONTRAST:  5 milliliters Gadavist COMPARISON:  PET-CT 11/11/2018. Report of brain MRI 12/12/2004 (no images available). FINDINGS: Brain: In the midline attached to the falx located below the superior sagittal sinus there is a 12 x 15 millimeter enhancing extra-axial mass compatible with the chronic meningioma described in 2006 (size unchanged since that time). No significant mass effect. No regional cerebral edema. No other abnormal intracranial enhancement or dural thickening identified. There is a normal sulcal variation suspected along the right superior frontal gyrus (series 6, image 22). No restricted diffusion to suggest acute infarction. No midline shift, mass effect, ventriculomegaly, extra-axial fluid collection or acute intracranial hemorrhage. Cervicomedullary junction and pituitary are within normal limits. There is patchy and widespread bilateral cerebral white matter T2 and FLAIR hyperintensity in both hemispheres. Mild to moderate patchy T2 heterogeneity in the pons. No cortical encephalomalacia. No chronic blood products. The deep gray nuclei and cerebellum are within normal limits for age. Vascular: Major intracranial vascular flow voids are preserved, with generalized intracranial artery tortuosity. The major dural venous sinuses are enhancing and appear to be patent. Skull and upper cervical spine: Partially visible cervical spine disc and endplate degeneration. Mild degenerative spinal stenosis probably with mild spinal cord mass effect at C4-C5. Otherwise negative visible spinal cord. Visualized bone marrow signal is within normal  limits. Sinuses/Orbits: Negative orbits. Mild or trace paranasal sinus mucosal thickening. Other: Visible internal auditory structures appear normal. Mastoids are clear. Scalp and face soft tissues appear negative. IMPRESSION: 1. No metastatic disease or acute intracranial abnormality identified. 2. Small chronic parafalcine meningioma size is stable since that described in 2006 (12 x 15 mm). 3. Advanced signal changes in the cerebral white matter and to a lesser extent pons, nonspecific but most commonly due to chronic small vessel disease. 4. Partially visible degenerative cervical spinal stenosis. Electronically Signed   By: Genevie Ann M.D.   On: 01/05/2019 16:36    All questions were answered. The patient knows to call the clinic with any problems, questions or concerns. No barriers to learning was detected.  I spent 15 minutes counseling the patient face to face. The total time spent in the appointment was 20 minutes and more than 50% was on counseling and review of test results  Heath Lark, MD 02/01/2019 11:51 AM

## 2019-02-01 NOTE — Assessment & Plan Note (Signed)
She has mild anemia She is not symptomatic She will continue high-dose folic acid and vitamin B12 per protocol

## 2019-02-01 NOTE — Patient Instructions (Signed)
[facility] Discharge Instructions for Patients Receiving Chemotherapy  Today you received the following chemotherapy agents Alimta and Carboplatin  To help prevent nausea and vomiting after your treatment, we encourage you to take your nausea medication as directed.   If you develop nausea and vomiting that is not controlled by your nausea medication, call the clinic.   BELOW ARE SYMPTOMS THAT SHOULD BE REPORTED IMMEDIATELY:  *FEVER GREATER THAN 100.5 F  *CHILLS WITH OR WITHOUT FEVER  NAUSEA AND VOMITING THAT IS NOT CONTROLLED WITH YOUR NAUSEA MEDICATION  *UNUSUAL SHORTNESS OF BREATH  *UNUSUAL BRUISING OR BLEEDING  TENDERNESS IN MOUTH AND THROAT WITH OR WITHOUT PRESENCE OF ULCERS  *URINARY PROBLEMS  *BOWEL PROBLEMS  UNUSUAL RASH Items with * indicate a potential emergency and should be followed up as soon as possible.  Feel free to call the clinic should you have any questions or concerns. The clinic phone number is [phone].  Please show the [facility] at check-in to the Emergency Department and triage nurse.   Pemetrexed injection (Alimta) What is this medicine? PEMETREXED (PEM e TREX ed) is a chemotherapy drug used to treat lung cancers like non-small cell lung cancer and mesothelioma. It may also be used to treat other cancers. This medicine may be used for other purposes; ask your health care provider or pharmacist if you have questions. COMMON BRAND NAME(S): Alimta What should I tell my health care provider before I take this medicine? They need to know if you have any of these conditions:  infection (especially a virus infection such as chickenpox, cold sores, or herpes)  kidney disease  low blood counts, like low white cell, platelet, or red cell counts  lung or breathing disease, like asthma  radiation therapy  an unusual or allergic reaction to pemetrexed, other medicines, foods, dyes, or preservative  pregnant or trying to  get pregnant  breast-feeding How should I use this medicine? This drug is given as an infusion into a vein. It is administered in a hospital or clinic by a specially trained health care professional. Talk to your pediatrician regarding the use of this medicine in children. Special care may be needed. Overdosage: If you think you have taken too much of this medicine contact a poison control center or emergency room at once. NOTE: This medicine is only for you. Do not share this medicine with others. What if I miss a dose? It is important not to miss your dose. Call your doctor or health care professional if you are unable to keep an appointment. What may interact with this medicine? This medicine may interact with the following medications:  Ibuprofen This list may not describe all possible interactions. Give your health care provider a list of all the medicines, herbs, non-prescription drugs, or dietary supplements you use. Also tell them if you smoke, drink alcohol, or use illegal drugs. Some items may interact with your medicine. What should I watch for while using this medicine? Visit your doctor for checks on your progress. This drug may make you feel generally unwell. This is not uncommon, as chemotherapy can affect healthy cells as well as cancer cells. Report any side effects. Continue your course of treatment even though you feel ill unless your doctor tells you to stop. In some cases, you may be given additional medicines to help with side effects. Follow all directions for their use. Call your doctor or health care professional for advice if you get a fever, chills or sore  throat, or other symptoms of a cold or flu. Do not treat yourself. This drug decreases your body's ability to fight infections. Try to avoid being around people who are sick. This medicine may increase your risk to bruise or bleed. Call your doctor or health care professional if you notice any unusual bleeding. Be  careful brushing and flossing your teeth or using a toothpick because you may get an infection or bleed more easily. If you have any dental work done, tell your dentist you are receiving this medicine. Avoid taking products that contain aspirin, acetaminophen, ibuprofen, naproxen, or ketoprofen unless instructed by your doctor. These medicines may hide a fever. Call your doctor or health care professional if you get diarrhea or mouth sores. Do not treat yourself. To protect your kidneys, drink water or other fluids as directed while you are taking this medicine. Do not become pregnant while taking this medicine or for 6 months after stopping it. Women should inform their doctor if they wish to become pregnant or think they might be pregnant. Men should not father a child while taking this medicine and for 3 months after stopping it. This may interfere with the ability to father a child. You should talk to your doctor or health care professional if you are concerned about your fertility. There is a potential for serious side effects to an unborn child. Talk to your health care professional or pharmacist for more information. Do not breast-feed an infant while taking this medicine or for 1 week after stopping it. What side effects may I notice from receiving this medicine? Side effects that you should report to your doctor or health care professional as soon as possible:  allergic reactions like skin rash, itching or hives, swelling of the face, lips, or tongue  breathing problems  redness, blistering, peeling or loosening of the skin, including inside the mouth  signs and symptoms of bleeding such as bloody or black, tarry stools; red or dark-brown urine; spitting up blood or brown material that looks like coffee grounds; red spots on the skin; unusual bruising or bleeding from the eye, gums, or nose  signs and symptoms of infection like fever or chills; cough; sore throat; pain or trouble passing  urine  signs and symptoms of kidney injury like trouble passing urine or change in the amount of urine  signs and symptoms of liver injury like dark yellow or brown urine; general ill feeling or flu-like symptoms; light-colored stools; loss of appetite; nausea; right upper belly pain; unusually weak or tired; yellowing of the eyes or skin Side effects that usually do not require medical attention (report to your doctor or health care professional if they continue or are bothersome):  constipation  mouth sores  nausea, vomiting  unusually weak or tired This list may not describe all possible side effects. Call your doctor for medical advice about side effects. You may report side effects to FDA at 1-800-FDA-1088. Where should I keep my medicine? This drug is given in a hospital or clinic and will not be stored at home. NOTE: This sheet is a summary. It may not cover all possible information. If you have questions about this medicine, talk to your doctor, pharmacist, or health care provider.  2020 Elsevier/Gold Standard (2017-06-18 16:11:33)   Carboplatin injection What is this medicine? CARBOPLATIN (KAR boe pla tin) is a chemotherapy drug. It targets fast dividing cells, like cancer cells, and causes these cells to die. This medicine is used to treat ovarian cancer  and many other cancers. This medicine may be used for other purposes; ask your health care provider or pharmacist if you have questions. COMMON BRAND NAME(S): Paraplatin What should I tell my health care provider before I take this medicine? They need to know if you have any of these conditions:  blood disorders  hearing problems  kidney disease  recent or ongoing radiation therapy  an unusual or allergic reaction to carboplatin, cisplatin, other chemotherapy, other medicines, foods, dyes, or preservatives  pregnant or trying to get pregnant  breast-feeding How should I use this medicine? This drug is usually  given as an infusion into a vein. It is administered in a hospital or clinic by a specially trained health care professional. Talk to your pediatrician regarding the use of this medicine in children. Special care may be needed. Overdosage: If you think you have taken too much of this medicine contact a poison control center or emergency room at once. NOTE: This medicine is only for you. Do not share this medicine with others. What if I miss a dose? It is important not to miss a dose. Call your doctor or health care professional if you are unable to keep an appointment. What may interact with this medicine?  medicines for seizures  medicines to increase blood counts like filgrastim, pegfilgrastim, sargramostim  some antibiotics like amikacin, gentamicin, neomycin, streptomycin, tobramycin  vaccines Talk to your doctor or health care professional before taking any of these medicines:  acetaminophen  aspirin  ibuprofen  ketoprofen  naproxen This list may not describe all possible interactions. Give your health care provider a list of all the medicines, herbs, non-prescription drugs, or dietary supplements you use. Also tell them if you smoke, drink alcohol, or use illegal drugs. Some items may interact with your medicine. What should I watch for while using this medicine? Your condition will be monitored carefully while you are receiving this medicine. You will need important blood work done while you are taking this medicine. This drug may make you feel generally unwell. This is not uncommon, as chemotherapy can affect healthy cells as well as cancer cells. Report any side effects. Continue your course of treatment even though you feel ill unless your doctor tells you to stop. In some cases, you may be given additional medicines to help with side effects. Follow all directions for their use. Call your doctor or health care professional for advice if you get a fever, chills or sore  throat, or other symptoms of a cold or flu. Do not treat yourself. This drug decreases your body's ability to fight infections. Try to avoid being around people who are sick. This medicine may increase your risk to bruise or bleed. Call your doctor or health care professional if you notice any unusual bleeding. Be careful brushing and flossing your teeth or using a toothpick because you may get an infection or bleed more easily. If you have any dental work done, tell your dentist you are receiving this medicine. Avoid taking products that contain aspirin, acetaminophen, ibuprofen, naproxen, or ketoprofen unless instructed by your doctor. These medicines may hide a fever. Do not become pregnant while taking this medicine. Women should inform their doctor if they wish to become pregnant or think they might be pregnant. There is a potential for serious side effects to an unborn child. Talk to your health care professional or pharmacist for more information. Do not breast-feed an infant while taking this medicine. What side effects may I notice from  receiving this medicine? Side effects that you should report to your doctor or health care professional as soon as possible:  allergic reactions like skin rash, itching or hives, swelling of the face, lips, or tongue  signs of infection - fever or chills, cough, sore throat, pain or difficulty passing urine  signs of decreased platelets or bleeding - bruising, pinpoint red spots on the skin, black, tarry stools, nosebleeds  signs of decreased red blood cells - unusually weak or tired, fainting spells, lightheadedness  breathing problems  changes in hearing  changes in vision  chest pain  high blood pressure  low blood counts - This drug may decrease the number of white blood cells, red blood cells and platelets. You may be at increased risk for infections and bleeding.  nausea and vomiting  pain, swelling, redness or irritation at the injection  site  pain, tingling, numbness in the hands or feet  problems with balance, talking, walking  trouble passing urine or change in the amount of urine Side effects that usually do not require medical attention (report to your doctor or health care professional if they continue or are bothersome):  hair loss  loss of appetite  metallic taste in the mouth or changes in taste This list may not describe all possible side effects. Call your doctor for medical advice about side effects. You may report side effects to FDA at 1-800-FDA-1088. Where should I keep my medicine? This drug is given in a hospital or clinic and will not be stored at home. NOTE: This sheet is a summary. It may not cover all possible information. If you have questions about this medicine, talk to your doctor, pharmacist, or health care provider.  2020 Elsevier/Gold Standard (2007-08-04 14:38:05)  Influenza (Flu) Vaccine (Inactivated or Recombinant): What You Need to Know 1. Why get vaccinated? Influenza vaccine can prevent influenza (flu). Flu is a contagious disease that spreads around the [place] every year, usually between October and May. Anyone can get the flu, but it is more dangerous for some people. Infants and young children, people 73 years of age and older, pregnant women, and people with certain health conditions or a weakened immune system are at greatest risk of flu complications. Pneumonia, bronchitis, sinus infections and ear infections are examples of flu-related complications. If you have a medical condition, such as heart disease, cancer or diabetes, flu can make it worse. Flu can cause fever and chills, sore throat, muscle aches, fatigue, cough, headache, and runny or stuffy nose. Some people may have vomiting and diarrhea, though this is more common in children than adults. Each year thousands of people in the [place] die from flu, and many more are hospitalized. Flu vaccine prevents millions  of illnesses and flu-related visits to the doctor each year. 2. Influenza vaccine CDC recommends everyone 63 months of age and older get vaccinated every flu season. Children 6 months through 35 years of age may need 2 doses during a single flu season. Everyone else needs only 1 dose each flu season. It takes about 2 weeks for protection to develop after vaccination. There are many flu viruses, and they are always changing. Each year a new flu vaccine is made to protect against three or four viruses that are likely to cause disease in the upcoming flu season. Even when the vaccine doesn't exactly match these viruses, it may still provide some protection. Influenza vaccine does not cause flu. Influenza vaccine may be given at the same time as  other vaccines. 3. Talk with your health care provider Tell your vaccine provider if the person getting the vaccine:  Has had an allergic reaction after a previous dose of influenza vaccine, or has any severe, life-threatening allergies.  Has ever had Guillain-Barr Syndrome (also called GBS). In some cases, your health care provider may decide to postpone influenza vaccination to a future visit. People with minor illnesses, such as a cold, may be vaccinated. People who are moderately or severely ill should usually wait until they recover before getting influenza vaccine. Your health care provider can give you more information. 4. Risks of a vaccine reaction  Soreness, redness, and swelling where shot is given, fever, muscle aches, and headache can happen after influenza vaccine.  There may be a very small increased risk of Guillain-Barr Syndrome (GBS) after inactivated influenza vaccine (the flu shot). Young children who get the flu shot along with pneumococcal vaccine (PCV13), and/or DTaP vaccine at the same time might be slightly more likely to have a seizure caused by fever. Tell your health care provider if a child who is getting flu vaccine has ever had  a seizure. People sometimes faint after medical procedures, including vaccination. Tell your provider if you feel dizzy or have vision changes or ringing in the ears. As with any medicine, there is a very remote chance of a vaccine causing a severe allergic reaction, other serious injury, or death. 5. What if there is a serious problem? An allergic reaction could occur after the vaccinated person leaves the clinic. If you see signs of a severe allergic reaction (hives, swelling of the face and throat, difficulty breathing, a fast heartbeat, dizziness, or weakness), call 9-1-1 and get the person to the nearest hospital. For other signs that concern you, call your health care provider. Adverse reactions should be reported to the Vaccine Adverse Event Reporting System (VAERS). Your health care provider will usually file this report, or you can do it yourself. Visit the VAERS website at www.vaers.SamedayNews.es or call 4142149853.VAERS is only for reporting reactions, and VAERS staff do not give medical advice. 6. The National Vaccine Injury Compensation Program The [employer] Vaccine Injury Compensation Program (VICP) is a federal program that was created to compensate people who may have been injured by certain vaccines. Visit the VICP website at GoldCloset.com.ee or call 519-227-1141 to learn about the program and about filing a claim. There is a time limit to file a claim for compensation. 7. How can I learn more?  Ask your healthcare provider.  Call your local or state health department.  Contact the Centers for Disease Control and Prevention (CDC): ? Call 240-865-6988 (1-800-CDC-INFO) or ? Visit CDC's https://gibson.com/ Vaccine Information Statement (Interim) Inactivated Influenza Vaccine (12/25/2017) This information is not intended to replace advice given to you by your health care provider. Make sure you discuss any questions you have with your health care provider. Document  Released: 02/21/2006 Document Revised: 08/18/2018 Document Reviewed: 12/29/2017 Elsevier Patient Education  2020 [employer].

## 2019-02-02 ENCOUNTER — Ambulatory Visit: Payer: Medicare Other | Admitting: Thoracic Surgery (Cardiothoracic Vascular Surgery)

## 2019-02-05 DIAGNOSIS — E7849 Other hyperlipidemia: Secondary | ICD-10-CM | POA: Diagnosis not present

## 2019-02-05 DIAGNOSIS — M8580 Other specified disorders of bone density and structure, unspecified site: Secondary | ICD-10-CM | POA: Diagnosis not present

## 2019-02-05 DIAGNOSIS — I10 Essential (primary) hypertension: Secondary | ICD-10-CM | POA: Diagnosis not present

## 2019-02-05 DIAGNOSIS — M859 Disorder of bone density and structure, unspecified: Secondary | ICD-10-CM | POA: Diagnosis not present

## 2019-02-05 DIAGNOSIS — R82998 Other abnormal findings in urine: Secondary | ICD-10-CM | POA: Diagnosis not present

## 2019-02-08 ENCOUNTER — Other Ambulatory Visit: Payer: Self-pay | Admitting: Thoracic Surgery (Cardiothoracic Vascular Surgery)

## 2019-02-08 DIAGNOSIS — C3491 Malignant neoplasm of unspecified part of right bronchus or lung: Secondary | ICD-10-CM

## 2019-02-09 ENCOUNTER — Encounter: Payer: Self-pay | Admitting: Thoracic Surgery (Cardiothoracic Vascular Surgery)

## 2019-02-09 ENCOUNTER — Ambulatory Visit (INDEPENDENT_AMBULATORY_CARE_PROVIDER_SITE_OTHER): Payer: Self-pay | Admitting: Thoracic Surgery (Cardiothoracic Vascular Surgery)

## 2019-02-09 ENCOUNTER — Ambulatory Visit
Admission: RE | Admit: 2019-02-09 | Discharge: 2019-02-09 | Disposition: A | Discharge status: Home / Self Care | Payer: Medicare Other | Source: Ambulatory Visit | Attending: Thoracic Surgery (Cardiothoracic Vascular Surgery) | Admitting: Thoracic Surgery (Cardiothoracic Vascular Surgery)

## 2019-02-09 ENCOUNTER — Other Ambulatory Visit: Payer: Self-pay

## 2019-02-09 VITALS — BP 170/80 | HR 81 | Temp 97.7°F | Resp 20 | Ht 59.0 in | Wt 110.0 lb

## 2019-02-09 DIAGNOSIS — M545 Low back pain: Secondary | ICD-10-CM | POA: Diagnosis not present

## 2019-02-09 DIAGNOSIS — D696 Thrombocytopenia, unspecified: Secondary | ICD-10-CM | POA: Diagnosis not present

## 2019-02-09 DIAGNOSIS — I700 Atherosclerosis of aorta: Secondary | ICD-10-CM | POA: Diagnosis not present

## 2019-02-09 DIAGNOSIS — R911 Solitary pulmonary nodule: Secondary | ICD-10-CM

## 2019-02-09 DIAGNOSIS — C3490 Malignant neoplasm of unspecified part of unspecified bronchus or lung: Secondary | ICD-10-CM | POA: Diagnosis not present

## 2019-02-09 DIAGNOSIS — C7951 Secondary malignant neoplasm of bone: Secondary | ICD-10-CM | POA: Diagnosis not present

## 2019-02-09 DIAGNOSIS — M8580 Other specified disorders of bone density and structure, unspecified site: Secondary | ICD-10-CM | POA: Diagnosis not present

## 2019-02-09 DIAGNOSIS — D6481 Anemia due to antineoplastic chemotherapy: Secondary | ICD-10-CM | POA: Diagnosis not present

## 2019-02-09 DIAGNOSIS — C3491 Malignant neoplasm of unspecified part of right bronchus or lung: Secondary | ICD-10-CM

## 2019-02-09 DIAGNOSIS — Z0000 Encounter for general adult medical examination without abnormal findings: Secondary | ICD-10-CM | POA: Diagnosis not present

## 2019-02-09 DIAGNOSIS — C50919 Malignant neoplasm of unspecified site of unspecified female breast: Secondary | ICD-10-CM | POA: Diagnosis not present

## 2019-02-09 DIAGNOSIS — J309 Allergic rhinitis, unspecified: Secondary | ICD-10-CM | POA: Diagnosis not present

## 2019-02-09 DIAGNOSIS — Z452 Encounter for adjustment and management of vascular access device: Secondary | ICD-10-CM | POA: Diagnosis not present

## 2019-02-09 DIAGNOSIS — C55 Malignant neoplasm of uterus, part unspecified: Secondary | ICD-10-CM | POA: Diagnosis not present

## 2019-02-09 DIAGNOSIS — C3431 Malignant neoplasm of lower lobe, right bronchus or lung: Secondary | ICD-10-CM | POA: Diagnosis not present

## 2019-02-09 NOTE — Progress Notes (Signed)
[address]       [place] [address]             [phone]    HPI: [name] returns for scheduled follow-up visit  [name] is a 78 year old woman with a history of breast cancer, endometrial cancer, hypertension, hyperlipidemia, and palpitations.  She recently had a CT of the chest which showed an increased size of a groundglass nodule in the superior segment of the right lower lobe which extended across the fissure and involve the inferior aspect of the right upper lobe.  I did a thoracoscopic resection with a right lower lobe superior segmentectomy with en bloc resection of a portion of the right upper lobe along with a lymph node dissection on 12/07/2018.  The nodule was a primary lung adenocarcinoma.  Stage was 3A (T1, N2).  She did well postoperatively.  She is currently undergoing chemotherapy with cis-platinum and pemetrexed.  Dr. Alvy Bimler is her oncologist.  She is felt fatigued particularly since the second infusion of chemotherapy.  She is not having any shortness of breath.  She has some discomfort if she has physical contact in the area of the incision but is not having pain otherwise.  Past Medical History:  Diagnosis Date  . Endometrial cancer ([place]) 12/2007   s/p total abdominal hysterectomy at [facility] - ovaries were also removed   . Family history of breast cancer   . History of breast cancer    right breast -- treated with lumpectomy and radiation therarpy, postoperatively with tamoxifen   . History of radiation therapy 10/22/2016 to 10/28/2016  . Hypercholesterolemia   . Hyperlipidemia   . Hypertension   . Palpitations   . Personal history of radiation therapy 2006  . PVC's (premature ventricular contractions)   . Radiation 07/11/14-08/12/14   left lumbar paraspinal area 55 gray  . S/P radiation therapy 10/28/11-12/05/11   5040 cGy left pelvis  . S/P radiation therapy    Intracavitary brachytherapy of uterus    Current Outpatient  Medications  Medication Sig Dispense Refill  . amLODipine (NORVASC) 5 MG tablet Take 5 mg by mouth daily.    Marland Kitchen atenolol (TENORMIN) 25 MG tablet Take 25 mg by mouth 2 (two) times daily.     . Biotin 5000 MCG TABS Take 5,000 mcg by mouth daily.    . folic acid (FOLVITE) 1 MG tablet Take 1 tablet (1 mg total) by mouth daily. Start 5-7 days before Alimta chemotherapy. Continue until 21 days after Alimta completed. 100 tablet 3  . gabapentin (NEURONTIN) 100 MG capsule TAKE 2 3 CAPSULES AT NIGHT    . lidocaine-prilocaine (EMLA) cream Apply to [facility] cath 1-2 hours prior to access as directed 30 g 1  . LORazepam (ATIVAN) 0.5 MG tablet 1 tablet po 30 minutes prior to radiation or MRI 30 tablet 0  . Magnesium 250 MG TABS Take 250 mg by mouth daily.     . Multiple Vitamin (MULTIVITAMIN WITH MINERALS) TABS tablet Take 1 tablet by mouth daily.    . ondansetron (ZOFRAN) 8 MG tablet Take 1 tablet (8 mg total) by mouth every 8 (eight) hours as needed (Nausea or vomiting). Start if needed on the third day after chemotherapy. 30 tablet 1  . potassium gluconate 595 MG TABS tablet Take 595 mg by mouth daily.     . prochlorperazine (COMPAZINE) 10 MG tablet Take 1 tablet (10 mg total) by mouth every 6 (six) hours as needed (  Nausea or vomiting). 30 tablet 1  . simvastatin (ZOCOR) 20 MG tablet Take 20 mg by mouth every evening.     . traMADol (ULTRAM) 50 MG tablet Take 1 tablet (50 mg total) by mouth every 6 (six) hours as needed (mild pain). 30 tablet 0  . TURMERIC PO Take 1 capsule by mouth daily.     No current facility-administered medications for this visit.    Facility-Administered Medications Ordered in Other Visits  Medication Dose Route Frequency Provider Last Rate Last Dose  . heparin lock flush 100 unit/mL  500 Units Intracatheter Once Alvy Bimler, Ni, MD      . sodium chloride flush (NS) 0.9 % injection 10 mL  10 mL Intracatheter Once Heath Lark, MD        Physical Exam BP (!) 170/80   Pulse 81   Temp  97.7 F (36.5 C) (Skin)   Resp 20   Ht 4\' 11"  (1.499 m)   Wt 110 lb (49.9 kg)   SpO2 97% Comment: RA  BMI 22.62 kg/m  78 year old woman in no acute distress Alert and oriented x3 with no focal deficits Lungs clear with equal breath sounds bilaterally Cardiac regular rate and rhythm normal S1 and S2  Diagnostic Tests: CHEST - 2 VIEW  COMPARISON:  12/21/2018  FINDINGS: Left chest wall port a catheter is noted with tip in the projection of the SVC. Normal heart size. Resolution of previous small right apex pneumothorax and right pleural effusion. No airspace densities. Previous right axillary nodal dissection.  IMPRESSION: No active cardiopulmonary disease. Resolution of right apex pneumothorax and right pleural effusion.   Electronically Signed   By: Kerby Moors M.D.   On: 02/09/2019 13:49 I personally reviewed the chest x-ray images and concur with the findings noted above  Impression: [name] is a 78 year old woman with a history of breast cancer, endometrial cancer, hypertension, hyperlipidemia, and palpitations.  She had a groundglass opacity in the superior segment of the right lower lobe which appeared to cross the fissure and involve the inferior aspect of the upper lobe.  This got larger on serial CT scans.  I did a right lower lobe superior segmentectomy and removed a small part of the right upper lobe en bloc with the mass.  Lesion turned out to be a T1, N2, stage III a primary lung adenocarcinoma.  Post right VATS-she has some mild discomfort with physical contact in the area the incision but otherwise is not having pain.  She is not having any respiratory issues.  Her chest x-ray shows a good postoperative appearance.  She currently is having some issues with fatigue after her second infusion of chemotherapy.  She has a plan of 4 cycles.  Hypertension-blood pressure elevated.  Plan: Return in 9 months for 1 year follow-up  Melrose Nakayama, MD  [facility] [phone]

## 2019-02-18 ENCOUNTER — Ambulatory Visit (HOSPITAL_COMMUNITY): Payer: Medicare Other

## 2019-02-19 ENCOUNTER — Inpatient Hospital Stay: Payer: Medicare Other

## 2019-02-19 ENCOUNTER — Inpatient Hospital Stay: Payer: Medicare Other | Attending: Hematology and Oncology

## 2019-02-19 ENCOUNTER — Inpatient Hospital Stay (HOSPITAL_BASED_OUTPATIENT_CLINIC_OR_DEPARTMENT_OTHER): Payer: Medicare Other | Admitting: Hematology and Oncology

## 2019-02-19 ENCOUNTER — Encounter: Payer: Self-pay | Admitting: Hematology and Oncology

## 2019-02-19 ENCOUNTER — Other Ambulatory Visit: Payer: Self-pay

## 2019-02-19 DIAGNOSIS — C3491 Malignant neoplasm of unspecified part of right bronchus or lung: Secondary | ICD-10-CM | POA: Diagnosis not present

## 2019-02-19 DIAGNOSIS — I2510 Atherosclerotic heart disease of native coronary artery without angina pectoris: Secondary | ICD-10-CM | POA: Insufficient documentation

## 2019-02-19 DIAGNOSIS — Z853 Personal history of malignant neoplasm of breast: Secondary | ICD-10-CM | POA: Diagnosis not present

## 2019-02-19 DIAGNOSIS — Z79899 Other long term (current) drug therapy: Secondary | ICD-10-CM | POA: Insufficient documentation

## 2019-02-19 DIAGNOSIS — Z87891 Personal history of nicotine dependence: Secondary | ICD-10-CM | POA: Insufficient documentation

## 2019-02-19 DIAGNOSIS — Z803 Family history of malignant neoplasm of breast: Secondary | ICD-10-CM | POA: Insufficient documentation

## 2019-02-19 DIAGNOSIS — D61818 Other pancytopenia: Secondary | ICD-10-CM

## 2019-02-19 DIAGNOSIS — J320 Chronic maxillary sinusitis: Secondary | ICD-10-CM | POA: Insufficient documentation

## 2019-02-19 DIAGNOSIS — R222 Localized swelling, mass and lump, trunk: Secondary | ICD-10-CM | POA: Diagnosis not present

## 2019-02-19 DIAGNOSIS — F419 Anxiety disorder, unspecified: Secondary | ICD-10-CM | POA: Diagnosis not present

## 2019-02-19 DIAGNOSIS — I10 Essential (primary) hypertension: Secondary | ICD-10-CM | POA: Insufficient documentation

## 2019-02-19 DIAGNOSIS — C549 Malignant neoplasm of corpus uteri, unspecified: Secondary | ICD-10-CM

## 2019-02-19 DIAGNOSIS — Z90722 Acquired absence of ovaries, bilateral: Secondary | ICD-10-CM | POA: Insufficient documentation

## 2019-02-19 DIAGNOSIS — Z8249 Family history of ischemic heart disease and other diseases of the circulatory system: Secondary | ICD-10-CM | POA: Insufficient documentation

## 2019-02-19 DIAGNOSIS — R5383 Other fatigue: Secondary | ICD-10-CM | POA: Insufficient documentation

## 2019-02-19 DIAGNOSIS — R0609 Other forms of dyspnea: Secondary | ICD-10-CM | POA: Diagnosis not present

## 2019-02-19 DIAGNOSIS — R0602 Shortness of breath: Secondary | ICD-10-CM | POA: Insufficient documentation

## 2019-02-19 DIAGNOSIS — C3431 Malignant neoplasm of lower lobe, right bronchus or lung: Secondary | ICD-10-CM | POA: Insufficient documentation

## 2019-02-19 DIAGNOSIS — D701 Agranulocytosis secondary to cancer chemotherapy: Secondary | ICD-10-CM

## 2019-02-19 DIAGNOSIS — Z823 Family history of stroke: Secondary | ICD-10-CM | POA: Insufficient documentation

## 2019-02-19 DIAGNOSIS — I700 Atherosclerosis of aorta: Secondary | ICD-10-CM | POA: Diagnosis not present

## 2019-02-19 DIAGNOSIS — Z95828 Presence of other vascular implants and grafts: Secondary | ICD-10-CM

## 2019-02-19 DIAGNOSIS — Z923 Personal history of irradiation: Secondary | ICD-10-CM | POA: Diagnosis not present

## 2019-02-19 DIAGNOSIS — Z5111 Encounter for antineoplastic chemotherapy: Secondary | ICD-10-CM | POA: Insufficient documentation

## 2019-02-19 DIAGNOSIS — Z885 Allergy status to narcotic agent status: Secondary | ICD-10-CM | POA: Diagnosis not present

## 2019-02-19 DIAGNOSIS — C7951 Secondary malignant neoplasm of bone: Secondary | ICD-10-CM | POA: Insufficient documentation

## 2019-02-19 DIAGNOSIS — Z1212 Encounter for screening for malignant neoplasm of rectum: Secondary | ICD-10-CM | POA: Diagnosis not present

## 2019-02-19 DIAGNOSIS — C541 Malignant neoplasm of endometrium: Secondary | ICD-10-CM | POA: Insufficient documentation

## 2019-02-19 DIAGNOSIS — Z9221 Personal history of antineoplastic chemotherapy: Secondary | ICD-10-CM | POA: Diagnosis not present

## 2019-02-19 LAB — CMP (CANCER CENTER ONLY)
ALT: 18 U/L (ref 0–44)
AST: 27 U/L (ref 15–41)
Albumin: 3.5 g/dL (ref 3.5–5.0)
Alkaline Phosphatase: 53 U/L (ref 38–126)
Anion gap: 12 (ref 5–15)
BUN: 15 mg/dL (ref 8–23)
CO2: 24 mmol/L (ref 22–32)
Calcium: 9.4 mg/dL (ref 8.9–10.3)
Chloride: 108 mmol/L (ref 98–111)
Creatinine: 0.77 mg/dL (ref 0.44–1.00)
GFR, Est AFR Am: >60 mL/min (ref >60)
GFR, Estimated: >60 mL/min (ref >60)
Glucose, Bld: 124 mg/dL — ABNORMAL HIGH (ref 70–99)
Potassium: 3.7 mmol/L (ref 3.5–5.1)
Sodium: 144 mmol/L (ref 135–145)
Total Bilirubin: 0.3 mg/dL (ref 0.3–1.2)
Total Protein: 6.6 g/dL (ref 6.5–8.1)

## 2019-02-19 LAB — CBC WITH DIFFERENTIAL (CANCER CENTER ONLY)
Abs Immature Granulocytes: 0.03 10*3/uL (ref 0.00–0.07)
Basophils Absolute: 0 10*3/uL (ref 0.0–0.1)
Basophils Relative: 1 %
Eosinophils Absolute: 0.1 10*3/uL (ref 0.0–0.5)
Eosinophils Relative: 2 %
HCT: 30.3 % — ABNORMAL LOW (ref 36.0–46.0)
Hemoglobin: 10 g/dL — ABNORMAL LOW (ref 12.0–15.0)
Immature Granulocytes: 1 %
Lymphocytes Relative: 33 %
Lymphs Abs: 1.1 10*3/uL (ref 0.7–4.0)
MCH: 30.7 pg (ref 26.0–34.0)
MCHC: 33 g/dL (ref 30.0–36.0)
MCV: 92.9 fL (ref 80.0–100.0)
Monocytes Absolute: 0.7 10*3/uL (ref 0.1–1.0)
Monocytes Relative: 19 %
Neutro Abs: 1.6 10*3/uL — ABNORMAL LOW (ref 1.7–7.7)
Neutrophils Relative %: 44 %
Platelet Count: 159 10*3/uL (ref 150–400)
RBC: 3.26 MIL/uL — ABNORMAL LOW (ref 3.87–5.11)
RDW: 15.8 % — ABNORMAL HIGH (ref 11.5–15.5)
WBC Count: 3.5 10*3/uL — ABNORMAL LOW (ref 4.0–10.5)
nRBC: 0 % (ref 0.0–0.2)

## 2019-02-19 MED ORDER — CYANOCOBALAMIN 1000 MCG/ML IJ SOLN: 1000.0000 ug | Freq: Once | INTRAMUSCULAR | Status: DC

## 2019-02-19 MED ORDER — SODIUM CHLORIDE 0.9 % IV SOLN
500.0000 mg/m2 | Freq: Once | INTRAVENOUS | Status: AC
  Administered 2019-02-19: 10:00:00 700 mg via INTRAVENOUS
  Filled 2019-02-19: qty 20

## 2019-02-19 MED ORDER — HEPARIN SOD (PORK) LOCK FLUSH 100 UNIT/ML IV SOLN
500.0000 [IU] | Freq: Once | INTRAVENOUS | Status: AC | PRN
  Administered 2019-02-19: 500 [IU]
  Filled 2019-02-19: qty 5

## 2019-02-19 MED ORDER — SODIUM CHLORIDE 0.9% FLUSH
10.0000 mL | Freq: Once | INTRAVENOUS | Status: AC
  Administered 2019-02-19: 08:00:00 10 mL
  Filled 2019-02-19: qty 10

## 2019-02-19 MED ORDER — SODIUM CHLORIDE 0.9 % IV SOLN
302.0000 mg | Freq: Once | INTRAVENOUS | Status: AC
  Administered 2019-02-19: 300 mg via INTRAVENOUS
  Filled 2019-02-19: qty 30

## 2019-02-19 MED ORDER — FAMOTIDINE IN NACL 20-0.9 MG/50ML-% IV SOLN
20.0000 mg | Freq: Once | INTRAVENOUS | Status: AC
  Administered 2019-02-19: 09:00:00 20 mg via INTRAVENOUS

## 2019-02-19 MED ORDER — SODIUM CHLORIDE 0.9 % IV SOLN
Freq: Once | INTRAVENOUS | Status: AC
  Administered 2019-02-19: 09:00:00 via INTRAVENOUS
  Filled 2019-02-19: qty 250

## 2019-02-19 MED ORDER — PALONOSETRON HCL INJECTION 0.25 MG/5ML
INTRAVENOUS | Status: AC
  Filled 2019-02-19: qty 5

## 2019-02-19 MED ORDER — SODIUM CHLORIDE 0.9% FLUSH
10.0000 mL | INTRAVENOUS | Status: DC | PRN
  Administered 2019-02-19: 10 mL
  Filled 2019-02-19: qty 10

## 2019-02-19 MED ORDER — DIPHENHYDRAMINE HCL 50 MG/ML IJ SOLN
INTRAMUSCULAR | Status: AC
  Filled 2019-02-19: qty 1

## 2019-02-19 MED ORDER — PALONOSETRON HCL INJECTION 0.25 MG/5ML
0.2500 mg | Freq: Once | INTRAVENOUS | Status: AC
  Administered 2019-02-19: 0.25 mg via INTRAVENOUS

## 2019-02-19 MED ORDER — SODIUM CHLORIDE 0.9 % IV SOLN
Freq: Once | INTRAVENOUS | Status: AC
  Administered 2019-02-19: 09:00:00 via INTRAVENOUS
  Filled 2019-02-19: qty 5

## 2019-02-19 MED ORDER — FAMOTIDINE IN NACL 20-0.9 MG/50ML-% IV SOLN
INTRAVENOUS | Status: AC
  Filled 2019-02-19: qty 50

## 2019-02-19 MED ORDER — DIPHENHYDRAMINE HCL 50 MG/ML IJ SOLN
25.0000 mg | Freq: Once | INTRAMUSCULAR | Status: AC
  Administered 2019-02-19: 25 mg via INTRAVENOUS

## 2019-02-19 NOTE — Assessment & Plan Note (Signed)
Her blood pressure is significantly elevated today due to anxiety She is not symptomatic Observe only for now

## 2019-02-19 NOTE — Assessment & Plan Note (Signed)
She is mildly symptomatic but not at the threshold for transfusion We will proceed with treatment as scheduled but to delay her next treatment for a few days to allow recovery

## 2019-02-19 NOTE — Progress Notes (Signed)
[place] OFFICE PROGRESS NOTE  Patient Care Team: Shon Baton, MD as PCP - General (Internal Medicine) Heath Lark, MD as Consulting Physician (Hematology and Oncology) Heath Lark, MD as Consulting Physician (Hematology and Oncology) Nancy Marus, MD as Attending Physician (Gynecologic Oncology)  ASSESSMENT & PLAN:  Primary lung cancer, right [facility]) She tolerated treatment well except for fatigue She is developing more anemic I recommend delaying her next dose for a few days to allow some blood count recovery and she agreed The plan would be to complete 4 cycles of treatment followed by repeat imaging study and then radiation treatment  Pancytopenia, acquired [facility]) She is mildly symptomatic but not at the threshold for transfusion We will proceed with treatment as scheduled but to delay her next treatment for a few days to allow recovery  White coat syndrome with diagnosis of hypertension Her blood pressure is significantly elevated today due to anxiety She is not symptomatic Observe only for now   No orders of the defined types were placed in this encounter.   INTERVAL HISTORY: Please see below for problem oriented charting. She returns for cycle 3 of chemotherapy She complains of fatigue No recent bleeding Denies recent infection, fever or chills No recent cough  SUMMARY OF ONCOLOGIC HISTORY: Oncology History Overview Note  Biopsy of recurrence 06-2014 ER negative. PDL1 testing low at 2% on the uterine cancer HER 2 negative on pathology [id number] Negative genetic testing  On her lung cancer sample, [employer] [facility] testing revealed 10% PD-1 positive EGFR mutation was detected, exon 19 deletion   Malignant neoplasm of corpus uteri, except isthmus ([place])  12/22/2007 Surgery   Staging for IA UPSC    - 05/28/2008 Chemotherapy   6 cycles of paclitaxel and carboplatin with vaginal cuff brachytherapy   03/22/2011 Relapse/Recurrence   Left pelvic node  recurrence   04/25/2011 Surgery   L/S evaluation, not resectable.   05/16/2011 - 09/25/2011 Chemotherapy   6 cyclces paclitaxel and carboplatin    - 12/27/2011 Radiation Therapy   radiation completed   03/10/2013 Progression   progression left pelvic mass   04/02/2013 Surgery   Resection and IORT   06/13/2014 Relapse/Recurrence   soft tissue mass at L3-4   07/11/2014 - 08/12/2014 Radiation Therapy   IMRT + zometa   02/12/2016 Progression      - 01/18/2016 Chemotherapy   The patient had ondansetron (ZOFRAN) IVPB 16 mg, 16 mg (100 % of original dose 16 mg), Intravenous,  Once, 5 of 5 cycles Dose modification: 16 mg (original dose 16 mg, Cycle 2)  CARBOplatin (PARAPLATIN) 390 mg in sodium chloride 0.9 % 100 mL chemo infusion, 390 mg (100 % of original dose 392 mg), Intravenous,  Once, 5 of 5 cycles Dose modification: 392 mg (original dose 392 mg, Cycle 1), 446.5 mg (original dose 392 mg, Cycle 2), 390 mg (original dose 392 mg, Cycle 2), 342 mg (original dose 392 mg, Cycle 6)  CARBOplatin chemo intradermal Test Dose 100 mcg, 0.1 mg, Intradermal,  Once, 5 of 5 cycles  PACLitaxel (TAXOL) 252 mg in dextrose 5 % 250 mL chemo infusion (> '80mg'$ /m2), 175 mg/m2 = 252 mg, Intravenous,  Once, 6 of 6 cycles Dose modification: 135 mg/m2 (original dose 175 mg/m2, Cycle 5)  CARBOplatin (PARAPLATIN) 250 mg in sodium chloride 0.9 % 100 mL chemo infusion, 250 mg (100 % of original dose 254.4 mg), Intravenous,  Once, 6 of 6 cycles Dose modification:   (original dose 254.4 mg, Cycle 1)  PACLitaxel (  TAXOL) 114 mg in dextrose 5 % 250 mL chemo infusion ( ondansetron (ZOFRAN) 16 mg, dexamethasone (DECADRON) 20 mg in sodium chloride 0.9 % 50 mL IVPB, , Intravenous,  Once, 6 of 6 cycles  for chemotherapy treatment.     01/07/2019 -  Chemotherapy   The patient had palonosetron (ALOXI) injection 0.25 mg, 0.25 mg, Intravenous,  Once, 2 of 4 cycles Administration: 0.25 mg (01/07/2019), 0.25 mg (02/01/2019) PEMEtrexed  (ALIMTA) 700 mg in sodium chloride 0.9 % 100 mL chemo infusion, 500 mg/m2 = 700 mg, Intravenous,  Once, 2 of 4 cycles Administration: 700 mg (01/07/2019), 700 mg (02/01/2019) CARBOplatin (PARAPLATIN) 300 mg in sodium chloride 0.9 % 250 mL chemo infusion, 300 mg (100 % of original dose 304.5 mg), Intravenous,  Once, 2 of 4 cycles Dose modification:   (original dose 304.5 mg, Cycle 1) Administration: 300 mg (01/07/2019), 300 mg (02/01/2019) fosaprepitant (EMEND) 150 mg, dexamethasone (DECADRON) 12 mg in sodium chloride 0.9 % 145 mL IVPB, , Intravenous,  Once, 2 of 4 cycles Administration:  (01/07/2019),  (02/01/2019)  for chemotherapy treatment.    Endometrial cancer ([place])  12/01/2007 Initial Diagnosis   She has recurrent papillary serous endometrial carcinoma. Briefly she was diagnosed in 2009 with a FIGO IB pappilary serous carcinoma treated with hysterectomy followed by adjuvant chemo and vaginal brachytherapy. She then had a recurrence diagnosed in late 2012 that was deemed not to be surgically resectable, and was treated with 6 cycles of carbo/taxol followed by 5040 cGy of EBRT to the pelvic mass. She was then followed with serial imaging and was noted in June of this year to have slight increase in the size of the mass, and again in September there was small enlargement of the mass. She had a re-staging PET scan on 03/10/13 that showed FDG activity in the pelvic mass, with no other areas of disease   12/01/2007 Imaging   CT scan of chest, abdomen and pelvis: 1.  Mass like area of decreased attenuation in the central uterus, consistent with the known endometrial carcinoma.  Deep myometrial invasion is suspected.  Pelvic MRI without and with contrast could be performed for further staging workup if clinically warranted. 2.  No evidence of extrauterine extension or lymphadenopathy. 3.  Sigmoid diverticulosis incidentally noted.   12/15/2007 Surgery   --12/2007 laparoscopic hysterectomy and PLND --05/2008  complted 6 cycles adjuvant carbo/taxol followed by vaginal cuff brachy --03/2011 exploratory lararoscopy, ureterolysis --4/13 completed 6 cycels carbotaxol --8/13 completed EBRT to the left pelvic sidewall    06/10/2008 Imaging   CT scan abdomen and pelvis 1.  Nonspecific mildly prominent inguinal lymph nodes, right greater than left. 2.  Interval hysterectomy without evidence of recurrent pelvic mass or fluid collection.   03/22/2011 Imaging   Ct scan abdomen and pelvis 1.  Local uterine cancer recurrence with two large nodes along the left pelvic sidewall. 2.  No evidence of bowel obstruction, urinary obstruction, or more distant metastasis.   03/31/2011 Relapse/Recurrence   chemotherapy and external beam radiation   05/16/2011 - 09/17/2011 Chemotherapy   She had 6 cycles of carboplatin and Taxol    07/17/2011 Imaging   Ct scan abdomen and pelvis 1.  Interval improvement in the previously demonstrated left pelvic local recurrence of tumor. 2.  No disease progression or complication identified. 3.  Mild bladder wall thickening on the right, nonspecific and possibly related to incomplete distension and/or radiation therapy. 4.  Cholelithiasis   10/11/2011 Imaging   CT scan abdomen and pelvis  1.  Interval enlargement of the left pelvic sidewall masses. 2.  No new nodular disease in the pelvis or lymphadenopathy. 3.  No evidence  of distant metastasis. 4.  Mild hydronephrosis on the right is similar to prior. 5.  Focal thickening of the right aspect the bladder is stable compared to prior.    10/28/2011 - 12/05/2011 Radiation Therapy   radiation for pelvic sidewall recurrence   10/28/2011 - 12/05/2011 Radiation Therapy   10/28/11-12/05/11: Radiotherapy to the left pelvic sidewall   03/18/2012 Imaging   CT abdomen 1.  Today's study demonstrates a positive response to therapy with decreased size of left pelvic sidewall nodal masses, as detailed above.  No new soft tissue masses or new  lymphadenopathy is identified within the abdomen or pelvis. Continue attention on follow-up studies is recommended. 2.  Cholelithiasis without findings to suggest acute cholecystitis. 3.  Colonic diverticulosis without findings to suggest acute diverticulitis at this time. 4.  Mild asymmetric urinary bladder wall thickening is unchanged compared to prior examinations and is nonspecific.  No definite bladder wall mass is identified at this time. 5.  Additional findings, similar to prior examinations, as above   06/22/2012 Imaging   CT abdomen 1.  Further decreased size of the left pelvic peripherally enhancing lesion. 2.  No new sites of new or progressive disease. 3. Esophageal air fluid level suggests dysmotility or gastroesophageal reflux. 4.  Cholelithiasis   11/02/2012 Imaging   CT abdomen 1.  The left pelvic side wall lesion demonstrates mild increase in size from previous exam. 2.  No new sites of new or progressive disease. 3.  Cholelithiasis.    01/28/2013 Imaging   CT abdomen 1. Interval small increase in volume of enhancing mass adjacent to the left pelvic sidewall. 2. No evidence of abdominal or pelvic lymphadenopathy   04/02/2013 Surgery   pelvic mass resection with IORT   04/02/2013 - 04/02/2013 Radiation Therapy   04/02/13: Intraoperative radiotherapy to the left pelvis   05/10/2013 PET scan   1. Hypermetabolic mass in the deep left pelvis consistent with metastasis. 2. No additional evidence of local metastasis. No evidence of distant metastasis. 3. Small right lower lobe pulmonary nodule is not hypermetabolic.   07/27/2013 Imaging   No evidence of recurrent or metastatic disease. Apparent prior resection of the deep left pelvic metastasis.   02/08/2014 Imaging   No CT findings for recurrent or metastatic disease involving the abdomen/ pelvis   06/13/2014 Relapse/Recurrence   + recurrence paraspinous muscle   06/21/2014 Procedure   There is a soft tissue lesion  along the left side of L3-L4. This lesion roughly measures up to 2.2 cm. Needle was positioned along the posterior aspect of the lesion. Small amount of air in the paraspinal tissues following needle removal   06/21/2014 Pathology Results   Bone, biopsy, left lumbar paraspinal - METASTATIC POORLY DIFFERENTIATED CARCINOMA, CONSISTENT WITH HIGH GRADE SEROUS CARCINOMA SEE COMMENT. Microscopic Comment The carcinoma demonstrates the following immunophenotype: Cytokeratin 7 - patchy moderate to strong expression Estrogen receptor - negative expression P53 - strong diffuse expression TTF-1 - negative expression WT-1 - negative expression CD56 - focal moderate strong expression Synaptophysin - negative expression GCDFP - negative expression The history of primary endometrial papillary serous carcinoma and primary mammary carcinoma is noted. In the current case, the overall morphologic and immunophenotype are that of poorly differentiated carcinoma, consistent with high grade serous carcinoma.    07/11/2014 - 08/12/2014 Radiation Therapy   07/11/14-08/12/14: 55 Gy in 25  fractions to the Lumbar spine   08/15/2014 Imaging   CT scan of chest, abdomen and pelvis 1. Since the biopsy study of 06/21/2014, similar to slight decrease in size of a left paraspinous lesion at L3-4. 2. No new sites of metastatic disease identified. 3. 8 mm ground-glass nodule in the right lower lobe is grossly similar to 03/10/2013, suggesting a benign etiology. 4. Cholelithiasis. 5. Apparent sigmoid colonic wall thickening which could be due to underdistention. Colitis felt less likely. 6.  Atherosclerosis, including within the coronary arteries. 7. Pelvic floor laxity.   10/07/2014 Imaging   CT scan of chest, abdomen and pelvis: Stable to slight interval decrease in size of left paraspinous lesion at L3-4. No new sites of metastatic disease. Unchanged ground-glass nodule within the right lower lobe, potentially benign in etiology.  Recommend attention on followup. Cholelithiasis. Sigmoid colonic diverticulosis. No CT evidence to suggest acute diverticulitis.   01/19/2015 Imaging   Ct scan of abdomen and pelvis 1. Decrease in size of left paraspinous metastasis. 2. No new sites of disease. 3. Stable ground-glass attenuating nodule within the superior segment of right lower lobe. 4. Gallstones   05/02/2015 Imaging   Ct abdomen and pelvis: Slight interval increase in size of left lumbar paraspinal soft tissue metastasis. No new sites of metastatic disease identified within the abdomen or pelvis.    05/11/2015 PET scan   1. The small soft tissue mass just lateral to the left L3 for neural foramen is hypermetabolic, favoring malignancy. 2. There is also a small focus of hypermetabolic activity between the left L1 and L2 transverse processes, but without a CT correlate. 3. The 13 mm sub solid nodule in the superior segment right lower lobe is stable from recent exams but has slowly increased in size over the last 7 years. Although not hypermetabolic, the appearance is concerning for the possibility of a low grade adenocarcinoma. 4. Other imaging findings of potential clinical significance: Chronic bilateral maxillary sinusitis. Coronary, aortic arch, and branch vessel atherosclerotic vascular disease. Aortoiliac atherosclerotic vascular disease. Cholelithiasis. Sigmoid colon diverticulosis. Lumbar scoliosis.   07/11/2015 Imaging   Ct scan of chest, abdomen and pelvis: 1. 13 mm mixed solid and sub solid nodule in the posterior right lower lobe was present in 2009 and has clearly progressed in the interval since that study. Imaging features remain highly concerning for low-grade or well differentiated adenocarcinoma. 2. Slight increase size of in the hypermetabolic, rim enhancing nodule identified adjacent to the left L3-4 neural foramen. Metastatic disease remains a concern. 3. No evidence for discrete soft tissue lesion between  the left L1 and 2 transverse processes, the site of focal FDG uptake on the recent PET-CT. 4. Cholelithiasis. 5. Abdominal aortic atherosclerosis   08/03/2015 - 01/16/2016 Chemotherapy   She received carboplatin and Taxol   11/06/2015 PET scan   1. Hypermetabolic left I2-0 paraspinous metastasis (biopsy-proven) is stable in size and slightly decreased in metabolism. 2. Previously described small focus of hypermetabolism between the left L2 and L3 spinous processes has resolved. 3. New mild linear hypermetabolism to the left of the T11-12 spinous processes without discrete mass on the CT images, favor benign activity related activity, recommend attention on follow-up PET-CT.  4. No definite new sites of hypermetabolic metastatic disease. No recurrent hypermetabolic metastatic disease in the pelvis. 5. Interval stability of subsolid 1.3 cm superior segment right lower lobe pulmonary nodule without associated significant metabolism, which has grown compared to the 2009 chest CT study, and remain suspicious for low  grade adenocarcinoma. 6. Additional findings include aortic atherosclerosis, coronary atherosclerosis, mild multinodular goiter with no hypermetabolic thyroid nodules, cholelithiasis and moderate sigmoid diverticulosis.   02/12/2016 PET scan   1. Interval increase in size and metabolic activity of the LEFT paraspinal soft tissue metastasis. 2. New activity within the musculature of the LEFT chest wall. Given the unusual location of the paraspinal metastasis cannot exclude a second soft tissue metastasis to the musculature however favor benign physiologic activity. 3. Stable RIGHT lower lobe pulmonary nodule. 4. No evidence of local recurrence.     04/08/2016 Imaging   Ct scan of chest, abdomen and pelvis: 1. Similar size of a  left paravertebral abdominal lesion. 2. No new or progressive metastatic disease. 3. No correlate for the muscular activity about the lateral left chest  wall. 4.  Coronary artery atherosclerosis. Aortic atherosclerosis. 5. Cholelithiasis. 6. Similar right lower lobe pulmonary nodule.   06/03/2016 Imaging   CT abdomen and pelvis 1. Similar to mild enlargement of a paravertebral soft tissue lesion at the L3-4 level. 2. No new sites of disease identified. 3. Cholelithiasis. 4. Hysterectomy. 5.  Aortic atherosclerosis.    09/02/2016 Imaging   CT abdomen and pelvis 1. Continue mild interval increase in size of LEFT paraspinal mass (1-2 mm). 2. No evidence of new metastatic disease in the abdomen pelvis. 3. Post hysterectomy anatomy   09/26/2016 Imaging   MR lumbar spine 1. Left paraspinal metastasis with epicenter adjacent to the left L3 neural foramen does extend into the foramen to the level of the left lateral epidural space (series 9, image 31), but also tracks cephalad and caudal along the L2-L4 lumbar plexus (series 10, image 15). No extension into the left L2 or L4 neural foramina. And no more distal extension of tumor. 2. Abnormal signal in the medial left psoas and ventral left erector spinae muscles at L2 and L3 is probably denervation related. 3. No bone invasion or osseous metastatic disease. Suspect previous radiation of the L2 through L5 spinal levels. 4. No dural or intradural metastatic disease.    10/22/2016 Procedure   She underwent stereotactic Radiosurgery   10/30/2016 Genetic Testing   Patient has genetic testing done for Inheritable genetic mutation panel. Results revealed patient has no actionable mutation.   01/21/2017 Imaging   1. Reduced size and conspicuity of the left paraspinal mass at the L3-4 level. The mass still present but has enhancement similar to that of adjacent psoas musculature. 2. No new metastatic disease is identified. 3. Other imaging findings of potential clinical significance: Cholelithiasis. Aortic Atherosclerosis (ICD10-I70.0). Sigmoid diverticulosis. Lumbar scoliosis.   05/09/2017 Imaging    Ct scan of abdomen and pelvis 1. Paraspinal mass adjacent to the left side of L3-L4 is less distinct than prior examinations, and is therefore difficult to discretely measure, however, the overall appearance suggests continued positive response to therapy. 2. No new signs of metastatic disease elsewhere in the abdomen or pelvis. 3. Aortic atherosclerosis, in addition to at least right coronary artery disease. Assessment for potential risk factor modification, dietary therapy or pharmacologic therapy may be warranted, if clinically indicated. 4. Colonic diverticulosis without evidence of acute diverticulitis at this time. 5. Additional incidental findings, as above. Aortic Atherosclerosis (ICD10-I70.0).   08/14/2017 Imaging   1. Treated left paraspinal mass centered at L3-4. Mass size is stable but there has been some evolution of tumor characteristics with less central fluid seen today. Recommend continued surveillance. 2. No evidence of untreated metastasis. 3. Degenerative changes and related impingement  are described above.   10/28/2017 Imaging   Stable small left paraspinal soft tissue mass. No new or progressive disease within the abdomen or pelvis.  Cholelithiasis.  No radiographic evidence of cholecystitis.  Colonic diverticulosis, without radiographic evidence of diverticulitis.   04/30/2018 Imaging   Stable post treatment change of the partially necrotic LEFT paravertebral mass at L3-L4. 18 x 21 mm cross-section. Mass effect on the LEFT L3 nerve root redemonstrated. Enhancing and atrophic LEFT psoas is inseparable.   10/15/2018 Imaging   1. Stable post treatment size and appearance of partially necrotic left paravertebral mass at L3-4, measuring 19 x 25 mm on current exam (unchanged when measured at similar level on previous study). Mass effect on the adjacent left L3 nerve root is unchanged. Adjacent enhancing and atrophic left psoas muscle. 2. Left convex scoliosis with associated  multilevel degenerative spondylolysis and facet arthrosis, unchanged   10/29/2018 Imaging   Stable small left L3 paraspinal soft tissue mass. No evidence of new or progressive metastatic disease, or other acute findings.   Cholelithiasis.  No radiographic evidence of cholecystitis.   Colonic diverticulosis, without radiographic evidence of diverticulitis.   11/11/2018 PET scan   1. Low level metabolism (max SUV 2.0) associated with the irregular subsolid superior segment right lower lobe 2.1 cm pulmonary nodule. As better depicted on the recent diagnostic chest CT study, this nodule crosses the major fissure into the right upper lobe and has increased in size and density on multiple imaging studies back to 2009. Findings are most suggestive of primary bronchogenic adenocarcinoma. 2. No hypermetabolic thoracic adenopathy. 3. Persistent hypermetabolism (max SUV 8.0) associated with the known left L3-4 paraspinous metastasis, mildly decreased in metabolism since 02/12/2016 PET-CT. 4. No additional sites of hypermetabolic metastatic disease in the abdomen or pelvis. No metabolic evidence of peritoneal recurrence. No ascites. 5. Chronic findings include: Aortic Atherosclerosis (ICD10-I70.0). Coronary atherosclerosis. Chronic bilateral maxillary sinusitis. Cholelithiasis. Moderate sigmoid diverticulosis.   01/07/2019 -  Chemotherapy   The patient had palonosetron (ALOXI) injection 0.25 mg, 0.25 mg, Intravenous,  Once, 2 of 4 cycles Administration: 0.25 mg (01/07/2019), 0.25 mg (02/01/2019) PEMEtrexed (ALIMTA) 700 mg in sodium chloride 0.9 % 100 mL chemo infusion, 500 mg/m2 = 700 mg, Intravenous,  Once, 2 of 4 cycles Administration: 700 mg (01/07/2019), 700 mg (02/01/2019) CARBOplatin (PARAPLATIN) 300 mg in sodium chloride 0.9 % 250 mL chemo infusion, 300 mg (100 % of original dose 304.5 mg), Intravenous,  Once, 2 of 4 cycles Dose modification:   (original dose 304.5 mg, Cycle 1) Administration: 300 mg  (01/07/2019), 300 mg (02/01/2019) fosaprepitant (EMEND) 150 mg, dexamethasone (DECADRON) 12 mg in sodium chloride 0.9 % 145 mL IVPB, , Intravenous,  Once, 2 of 4 cycles Administration:  (01/07/2019),  (02/01/2019)  for chemotherapy treatment.    Metastatic cancer to bone ([place])  06/27/2014 Initial Diagnosis   Metastatic cancer to bone   Primary lung cancer, right ([place])  11/04/2018 Initial Diagnosis   Primary lung cancer, right ([place])   11/11/2018 PET scan   1. Low level metabolism (max SUV 2.0) associated with the irregular subsolid superior segment right lower lobe 2.1 cm pulmonary nodule. As better depicted on the recent diagnostic chest CT study, this nodule crosses the major fissure into the right upper lobe and has increased in size and density on multiple imaging studies back to 2009. Findings are most suggestive of primary bronchogenic adenocarcinoma. 2. No hypermetabolic thoracic adenopathy. 3. Persistent hypermetabolism (max SUV 8.0) associated with the known left L3-4 paraspinous  metastasis, mildly decreased in metabolism since 02/12/2016 PET-CT. 4. No additional sites of hypermetabolic metastatic disease in the abdomen or pelvis. No metabolic evidence of peritoneal recurrence. No ascites. 5. Chronic findings include: Aortic Atherosclerosis (ICD10-I70.0). Coronary atherosclerosis. Chronic bilateral maxillary sinusitis. Cholelithiasis. Moderate sigmoid diverticulosis.   12/07/2018 Pathology Results   1. Lung, resection (segmental or lobe), Right Lobe Superior with endblock wedge of right upper lobe - INVASIVE ADENOCARCINOMA, MODERATELY DIFFERENTIATED, SPANNING 1.6 CM. - THE SURGICAL RESECTION MARGINS ARE NEGATIVE FOR CARCINOMA. - SEE ONCOLOGY TABLE BELOW. 2. Lymph node, biopsy, Level 9 #1 - THERE IS NO EVIDENCE OF CARCINOMA IN 1 OF 1 LYMPH NODE (0/1). 3. Lymph node, biopsy, Level 9 #2 - THERE IS NO EVIDENCE OF CARCINOMA IN 1 OF 1 LYMPH NODE (0/1). 4. Lymph node, biopsy, Level 7 #1 -  METASTATIC CARCINOMA IN 1 OF 1 LYMPH NODE (1/1). 5. Lymph node, biopsy, Level 7 #2 - THERE IS NO EVIDENCE OF CARCINOMA IN 1 OF 1 LYMPH NODE (0/1). 6. Lymph node, biopsy, Level 12 #1 - THERE IS NO EVIDENCE OF CARCINOMA IN 1 OF 1 LYMPH NODE (0/1). 7. Lymph node, biopsy, Level 12 #2 - THERE IS NO EVIDENCE OF CARCINOMA IN 1 OF 1 LYMPH NODE (0/1). 8. Lymph node, biopsy, Level 10 #1 - THERE IS NO EVIDENCE OF CARCINOMA IN 1 OF 1 LYMPH NODE (0/1). 9. Lymph node, biopsy, Level 4R #1 - THERE IS NO EVIDENCE OF CARCINOMA IN 1 OF 1 LYMPH NODE (0/1). 10. Lymph node, biopsy, Level 4R #2 - THERE IS NO EVIDENCE OF CARCINOMA IN 1 OF 1 LYMPH NODE (0/1). 11. Lymph node, biopsy, Level 2R #1 - THERE IS NO EVIDENCE OF CARCINOMA IN 1 OF 1 LYMPH NODE (0/1). 12. Lung, resection (segmental or lobe), Right Lobe Superior - BENIGN LUNG PARENCHYMA. - THERE IS NO EVIDENCE OF MALIGNANCY. Procedure: Segmentectomy. Specimen Laterality: Right. Tumor Site: Right superior lobe. Tumor Size: 1.6 cm (gross measurement). Tumor Focality: Unifocal. Histologic Type: Adenocarcinoma, moderately differentiated. Visceral Pleura Invasion: Not identified. Lymphovascular Invasion: Not identified. Direct Invasion of Adjacent Structures: Not identified. Margins: Negative for carcinoma. Treatment Effect: N/A Regional Lymph Nodes: Number of Lymph Nodes Involved: 1 (level 7) Number of Lymph Nodes Examined: 10 Pathologic Stage Classification (pTNM, AJCC 8th Edition): pT1b, pN2 Ancillary Studies: The tumor cells are positive for Napsin-A, TTF-1, and p53. They are essentially negative for estrogen receptor, PAX-8 and progesterone receptor. Additional studies can be performed upon clinician request. Representative Tumor Block: 1D-1E. (JBK:gt, 12/09/18)   12/07/2018 Surgery   PREOPERATIVE DIAGNOSIS:  Right lung nodule.   POSTOPERATIVE DIAGNOSIS:  Non-small cell carcinoma- suspected primary lung carcinoma, clinical stage T2N01b.    PROCEDURE:   Right video-assisted thoracoscopy, Right lower lobe superior segmentectomy with en bloc wedge resection of right upper lobe, Lymph node dissection, Intercostal nerve block levels 3-9.   SURGEON:  Modesto Charon, MD   FINDINGS:  Mass palpable in posterior aspect of superior segment of right lower lobe, likely involvement across fissure into the upper lobe.  Frozen section revealed non-small cell carcinoma. upper and lower lobe margins clear.   12/23/2018 Cancer Staging   Staging form: Lung, AJCC 8th Edition - Pathologic: Stage IIIA (pT1b, pN2, cM0) - Signed by Heath Lark, MD on 12/23/2018   01/05/2019 Imaging   MRI brain 1. No metastatic disease or acute intracranial abnormality identified. 2. Small chronic parafalcine meningioma size is stable since that described in 2006 (12 x 15 mm). 3. Advanced signal changes in the cerebral white  matter and to a lesser extent pons, nonspecific but most commonly due to chronic small vessel disease. 4. Partially visible degenerative cervical spinal stenosis.     01/07/2019 -  Chemotherapy   The patient had carboplatin and Alimta for chemotherapy treatment.       REVIEW OF SYSTEMS:   Constitutional: Denies fevers, chills or abnormal weight loss Eyes: Denies blurriness of vision Ears, nose, mouth, throat, and face: Denies mucositis or sore throat Respiratory: Denies cough, dyspnea or wheezes Cardiovascular: Denies palpitation, chest discomfort or lower extremity swelling Gastrointestinal:  Denies nausea, heartburn or change in bowel habits Skin: Denies abnormal skin rashes Lymphatics: Denies new lymphadenopathy or easy bruising Neurological:Denies numbness, tingling or new weaknesses Behavioral/Psych: Mood is stable, no new changes  All other systems were reviewed with the patient and are negative.  I have reviewed the past medical history, past surgical history, social history and family history with the patient and they are  unchanged from previous note.  ALLERGIES:  is allergic to ace inhibitors; codeine; demerol  [meperidine hcl]; and dilaudid [hydromorphone hcl].  MEDICATIONS:  Current Outpatient Medications  Medication Sig Dispense Refill  . amLODipine (NORVASC) 5 MG tablet Take 5 mg by mouth daily.    Marland Kitchen atenolol (TENORMIN) 25 MG tablet Take 25 mg by mouth 2 (two) times daily.     . Biotin 5000 MCG TABS Take 5,000 mcg by mouth daily.    . folic acid (FOLVITE) 1 MG tablet Take 1 tablet (1 mg total) by mouth daily. Start 5-7 days before Alimta chemotherapy. Continue until 21 days after Alimta completed. 100 tablet 3  . gabapentin (NEURONTIN) 100 MG capsule TAKE 2 3 CAPSULES AT NIGHT    . lidocaine-prilocaine (EMLA) cream Apply to [facility] cath 1-2 hours prior to access as directed 30 g 1  . LORazepam (ATIVAN) 0.5 MG tablet 1 tablet po 30 minutes prior to radiation or MRI 30 tablet 0  . Magnesium 250 MG TABS Take 250 mg by mouth daily.     . Multiple Vitamin (MULTIVITAMIN WITH MINERALS) TABS tablet Take 1 tablet by mouth daily.    . ondansetron (ZOFRAN) 8 MG tablet Take 1 tablet (8 mg total) by mouth every 8 (eight) hours as needed (Nausea or vomiting). Start if needed on the third day after chemotherapy. 30 tablet 1  . potassium gluconate 595 MG TABS tablet Take 595 mg by mouth daily.     . prochlorperazine (COMPAZINE) 10 MG tablet Take 1 tablet (10 mg total) by mouth every 6 (six) hours as needed (Nausea or vomiting). 30 tablet 1  . simvastatin (ZOCOR) 20 MG tablet Take 20 mg by mouth every evening.     . traMADol (ULTRAM) 50 MG tablet Take 1 tablet (50 mg total) by mouth every 6 (six) hours as needed (mild pain). 30 tablet 0  . TURMERIC PO Take 1 capsule by mouth daily.     No current facility-administered medications for this visit.    Facility-Administered Medications Ordered in Other Visits  Medication Dose Route Frequency Provider Last Rate Last Dose  . heparin lock flush 100 unit/mL  500 Units  Intracatheter Once Natahlia Hoggard, MD      . sodium chloride flush (NS) 0.9 % injection 10 mL  10 mL Intracatheter Once Alvy Bimler, Yomaira Solar, MD        PHYSICAL EXAMINATION: ECOG PERFORMANCE STATUS: 1 - Symptomatic but completely ambulatory  Vitals:   02/19/19 0821  BP: (!) 158/70  Pulse: 70  Resp: 18  Temp:  97.8 F (36.6 C)  SpO2: 100%   Filed Weights   02/19/19 0821  Weight: 111 lb 6.4 oz (50.5 kg)    GENERAL:alert, no distress and comfortable SKIN: skin color, texture, turgor are normal, no rashes or significant lesions EYES: normal, Conjunctiva are pink and non-injected, sclera clear OROPHARYNX:no exudate, no erythema and lips, buccal mucosa, and tongue normal  NECK: supple, thyroid normal size, non-tender, without nodularity LYMPH:  no palpable lymphadenopathy in the cervical, axillary or inguinal LUNGS: clear to auscultation and percussion with normal breathing effort HEART: regular rate & rhythm and no murmurs and no lower extremity edema ABDOMEN:abdomen soft, non-tender and normal bowel sounds Musculoskeletal:no cyanosis of digits and no clubbing  NEURO: alert & oriented x 3 with fluent speech, no focal motor/sensory deficits  LABORATORY DATA:  I have reviewed the data as listed    Component Value Date/Time   NA 141 02/01/2019 1042   NA 140 05/09/2017 0844   K 4.1 02/01/2019 1042   K 4.1 05/09/2017 0844   CL 108 02/01/2019 1042   CO2 24 02/01/2019 1042   CO2 26 05/09/2017 0844   GLUCOSE 93 02/01/2019 1042   GLUCOSE 82 05/09/2017 0844   BUN 21 02/01/2019 1042   BUN 20.7 05/09/2017 0844   CREATININE 0.83 02/01/2019 1042   CREATININE 0.7 05/09/2017 0844   CALCIUM 9.3 02/01/2019 1042   CALCIUM 9.3 05/09/2017 0844   PROT 6.9 02/01/2019 1042   PROT 6.8 05/09/2017 0844   ALBUMIN 3.7 02/01/2019 1042   ALBUMIN 3.6 05/09/2017 0844   AST 25 02/01/2019 1042   AST 27 05/09/2017 0844   ALT 16 02/01/2019 1042   ALT 17 05/09/2017 0844   ALKPHOS 58 02/01/2019 1042   ALKPHOS 43  05/09/2017 0844   BILITOT 0.5 02/01/2019 1042   BILITOT 0.81 05/09/2017 0844   GFRNONAA >60 02/01/2019 1042   GFRAA >60 02/01/2019 1042    No results found for: SPEP, UPEP  Lab Results  Component Value Date   WBC 3.5 (L) 02/19/2019   NEUTROABS 1.6 (L) 02/19/2019   HGB 10.0 (L) 02/19/2019   HCT 30.3 (L) 02/19/2019   MCV 92.9 02/19/2019   PLT 159 02/19/2019      Chemistry      Component Value Date/Time   NA 141 02/01/2019 1042   NA 140 05/09/2017 0844   K 4.1 02/01/2019 1042   K 4.1 05/09/2017 0844   CL 108 02/01/2019 1042   CO2 24 02/01/2019 1042   CO2 26 05/09/2017 0844   BUN 21 02/01/2019 1042   BUN 20.7 05/09/2017 0844   CREATININE 0.83 02/01/2019 1042   CREATININE 0.7 05/09/2017 0844      Component Value Date/Time   CALCIUM 9.3 02/01/2019 1042   CALCIUM 9.3 05/09/2017 0844   ALKPHOS 58 02/01/2019 1042   ALKPHOS 43 05/09/2017 0844   AST 25 02/01/2019 1042   AST 27 05/09/2017 0844   ALT 16 02/01/2019 1042   ALT 17 05/09/2017 0844   BILITOT 0.5 02/01/2019 1042   BILITOT 0.81 05/09/2017 0844       RADIOGRAPHIC STUDIES: I have personally reviewed the radiological images as listed and agreed with the findings in the report. Dg Chest 2 View  Result Date: 02/09/2019 CLINICAL DATA:  Lung cancer.  Status post VATS EXAM: CHEST - 2 VIEW COMPARISON:  12/21/2018 FINDINGS: Left chest wall port a catheter is noted with tip in the projection of the SVC. Normal heart size. Resolution of previous small right apex  pneumothorax and right pleural effusion. No airspace densities. Previous right axillary nodal dissection. IMPRESSION: No active cardiopulmonary disease. Resolution of right apex pneumothorax and right pleural effusion. Electronically Signed   By: Kerby Moors M.D.   On: 02/09/2019 13:49    All questions were answered. The patient knows to call the clinic with any problems, questions or concerns. No barriers to learning was detected.  I spent 15 minutes counseling  the patient face to face. The total time spent in the appointment was 20 minutes and more than 50% was on counseling and review of test results  Heath Lark, MD 02/19/2019 8:36 AM

## 2019-02-19 NOTE — Patient Instructions (Signed)
[facility] Discharge Instructions for Patients Receiving Chemotherapy  Today you received the following chemotherapy agents Alimta and Carboplatin  To help prevent nausea and vomiting after your treatment, we encourage you to take your nausea medication as directed.   If you develop nausea and vomiting that is not controlled by your nausea medication, call the clinic.   BELOW ARE SYMPTOMS THAT SHOULD BE REPORTED IMMEDIATELY:  *FEVER GREATER THAN 100.5 F  *CHILLS WITH OR WITHOUT FEVER  NAUSEA AND VOMITING THAT IS NOT CONTROLLED WITH YOUR NAUSEA MEDICATION  *UNUSUAL SHORTNESS OF BREATH  *UNUSUAL BRUISING OR BLEEDING  TENDERNESS IN MOUTH AND THROAT WITH OR WITHOUT PRESENCE OF ULCERS  *URINARY PROBLEMS  *BOWEL PROBLEMS  UNUSUAL RASH Items with * indicate a potential emergency and should be followed up as soon as possible.  Feel free to call the clinic should you have any questions or concerns. The clinic phone number is [phone].  Please show the [facility] at check-in to the Emergency Department and triage nurse.   Pemetrexed injection (Alimta) What is this medicine? PEMETREXED (PEM e TREX ed) is a chemotherapy drug used to treat lung cancers like non-small cell lung cancer and mesothelioma. It may also be used to treat other cancers. This medicine may be used for other purposes; ask your health care provider or pharmacist if you have questions. COMMON BRAND NAME(S): Alimta What should I tell my health care provider before I take this medicine? They need to know if you have any of these conditions:  infection (especially a virus infection such as chickenpox, cold sores, or herpes)  kidney disease  low blood counts, like low white cell, platelet, or red cell counts  lung or breathing disease, like asthma  radiation therapy  an unusual or allergic reaction to pemetrexed, other medicines, foods, dyes, or preservative  pregnant or trying to  get pregnant  breast-feeding How should I use this medicine? This drug is given as an infusion into a vein. It is administered in a hospital or clinic by a specially trained health care professional. Talk to your pediatrician regarding the use of this medicine in children. Special care may be needed. Overdosage: If you think you have taken too much of this medicine contact a poison control center or emergency room at once. NOTE: This medicine is only for you. Do not share this medicine with others. What if I miss a dose? It is important not to miss your dose. Call your doctor or health care professional if you are unable to keep an appointment. What may interact with this medicine? This medicine may interact with the following medications:  Ibuprofen This list may not describe all possible interactions. Give your health care provider a list of all the medicines, herbs, non-prescription drugs, or dietary supplements you use. Also tell them if you smoke, drink alcohol, or use illegal drugs. Some items may interact with your medicine. What should I watch for while using this medicine? Visit your doctor for checks on your progress. This drug may make you feel generally unwell. This is not uncommon, as chemotherapy can affect healthy cells as well as cancer cells. Report any side effects. Continue your course of treatment even though you feel ill unless your doctor tells you to stop. In some cases, you may be given additional medicines to help with side effects. Follow all directions for their use. Call your doctor or health care professional for advice if you get a fever, chills or sore  throat, or other symptoms of a cold or flu. Do not treat yourself. This drug decreases your body's ability to fight infections. Try to avoid being around people who are sick. This medicine may increase your risk to bruise or bleed. Call your doctor or health care professional if you notice any unusual bleeding. Be  careful brushing and flossing your teeth or using a toothpick because you may get an infection or bleed more easily. If you have any dental work done, tell your dentist you are receiving this medicine. Avoid taking products that contain aspirin, acetaminophen, ibuprofen, naproxen, or ketoprofen unless instructed by your doctor. These medicines may hide a fever. Call your doctor or health care professional if you get diarrhea or mouth sores. Do not treat yourself. To protect your kidneys, drink water or other fluids as directed while you are taking this medicine. Do not become pregnant while taking this medicine or for 6 months after stopping it. Women should inform their doctor if they wish to become pregnant or think they might be pregnant. Men should not father a child while taking this medicine and for 3 months after stopping it. This may interfere with the ability to father a child. You should talk to your doctor or health care professional if you are concerned about your fertility. There is a potential for serious side effects to an unborn child. Talk to your health care professional or pharmacist for more information. Do not breast-feed an infant while taking this medicine or for 1 week after stopping it. What side effects may I notice from receiving this medicine? Side effects that you should report to your doctor or health care professional as soon as possible:  allergic reactions like skin rash, itching or hives, swelling of the face, lips, or tongue  breathing problems  redness, blistering, peeling or loosening of the skin, including inside the mouth  signs and symptoms of bleeding such as bloody or black, tarry stools; red or dark-brown urine; spitting up blood or brown material that looks like coffee grounds; red spots on the skin; unusual bruising or bleeding from the eye, gums, or nose  signs and symptoms of infection like fever or chills; cough; sore throat; pain or trouble passing  urine  signs and symptoms of kidney injury like trouble passing urine or change in the amount of urine  signs and symptoms of liver injury like dark yellow or brown urine; general ill feeling or flu-like symptoms; light-colored stools; loss of appetite; nausea; right upper belly pain; unusually weak or tired; yellowing of the eyes or skin Side effects that usually do not require medical attention (report to your doctor or health care professional if they continue or are bothersome):  constipation  mouth sores  nausea, vomiting  unusually weak or tired This list may not describe all possible side effects. Call your doctor for medical advice about side effects. You may report side effects to FDA at 1-800-FDA-1088. Where should I keep my medicine? This drug is given in a hospital or clinic and will not be stored at home. NOTE: This sheet is a summary. It may not cover all possible information. If you have questions about this medicine, talk to your doctor, pharmacist, or health care provider.  2020 Elsevier/Gold Standard (2017-06-18 16:11:33)   Carboplatin injection What is this medicine? CARBOPLATIN (KAR boe pla tin) is a chemotherapy drug. It targets fast dividing cells, like cancer cells, and causes these cells to die. This medicine is used to treat ovarian cancer  and many other cancers. This medicine may be used for other purposes; ask your health care provider or pharmacist if you have questions. COMMON BRAND NAME(S): Paraplatin What should I tell my health care provider before I take this medicine? They need to know if you have any of these conditions:  blood disorders  hearing problems  kidney disease  recent or ongoing radiation therapy  an unusual or allergic reaction to carboplatin, cisplatin, other chemotherapy, other medicines, foods, dyes, or preservatives  pregnant or trying to get pregnant  breast-feeding How should I use this medicine? This drug is usually  given as an infusion into a vein. It is administered in a hospital or clinic by a specially trained health care professional. Talk to your pediatrician regarding the use of this medicine in children. Special care may be needed. Overdosage: If you think you have taken too much of this medicine contact a poison control center or emergency room at once. NOTE: This medicine is only for you. Do not share this medicine with others. What if I miss a dose? It is important not to miss a dose. Call your doctor or health care professional if you are unable to keep an appointment. What may interact with this medicine?  medicines for seizures  medicines to increase blood counts like filgrastim, pegfilgrastim, sargramostim  some antibiotics like amikacin, gentamicin, neomycin, streptomycin, tobramycin  vaccines Talk to your doctor or health care professional before taking any of these medicines:  acetaminophen  aspirin  ibuprofen  ketoprofen  naproxen This list may not describe all possible interactions. Give your health care provider a list of all the medicines, herbs, non-prescription drugs, or dietary supplements you use. Also tell them if you smoke, drink alcohol, or use illegal drugs. Some items may interact with your medicine. What should I watch for while using this medicine? Your condition will be monitored carefully while you are receiving this medicine. You will need important blood work done while you are taking this medicine. This drug may make you feel generally unwell. This is not uncommon, as chemotherapy can affect healthy cells as well as cancer cells. Report any side effects. Continue your course of treatment even though you feel ill unless your doctor tells you to stop. In some cases, you may be given additional medicines to help with side effects. Follow all directions for their use. Call your doctor or health care professional for advice if you get a fever, chills or sore  throat, or other symptoms of a cold or flu. Do not treat yourself. This drug decreases your body's ability to fight infections. Try to avoid being around people who are sick. This medicine may increase your risk to bruise or bleed. Call your doctor or health care professional if you notice any unusual bleeding. Be careful brushing and flossing your teeth or using a toothpick because you may get an infection or bleed more easily. If you have any dental work done, tell your dentist you are receiving this medicine. Avoid taking products that contain aspirin, acetaminophen, ibuprofen, naproxen, or ketoprofen unless instructed by your doctor. These medicines may hide a fever. Do not become pregnant while taking this medicine. Women should inform their doctor if they wish to become pregnant or think they might be pregnant. There is a potential for serious side effects to an unborn child. Talk to your health care professional or pharmacist for more information. Do not breast-feed an infant while taking this medicine. What side effects may I notice from  receiving this medicine? Side effects that you should report to your doctor or health care professional as soon as possible:  allergic reactions like skin rash, itching or hives, swelling of the face, lips, or tongue  signs of infection - fever or chills, cough, sore throat, pain or difficulty passing urine  signs of decreased platelets or bleeding - bruising, pinpoint red spots on the skin, black, tarry stools, nosebleeds  signs of decreased red blood cells - unusually weak or tired, fainting spells, lightheadedness  breathing problems  changes in hearing  changes in vision  chest pain  high blood pressure  low blood counts - This drug may decrease the number of white blood cells, red blood cells and platelets. You may be at increased risk for infections and bleeding.  nausea and vomiting  pain, swelling, redness or irritation at the injection  site  pain, tingling, numbness in the hands or feet  problems with balance, talking, walking  trouble passing urine or change in the amount of urine Side effects that usually do not require medical attention (report to your doctor or health care professional if they continue or are bothersome):  hair loss  loss of appetite  metallic taste in the mouth or changes in taste This list may not describe all possible side effects. Call your doctor for medical advice about side effects. You may report side effects to FDA at 1-800-FDA-1088. Where should I keep my medicine? This drug is given in a hospital or clinic and will not be stored at home. NOTE: This sheet is a summary. It may not cover all possible information. If you have questions about this medicine, talk to your doctor, pharmacist, or health care provider.  2020 Elsevier/Gold Standard (2007-08-04 14:38:05)  Influenza (Flu) Vaccine (Inactivated or Recombinant): What You Need to Know 1. Why get vaccinated? Influenza vaccine can prevent influenza (flu). Flu is a contagious disease that spreads around the [place] every year, usually between October and May. Anyone can get the flu, but it is more dangerous for some people. Infants and young children, people 78 years of age and older, pregnant women, and people with certain health conditions or a weakened immune system are at greatest risk of flu complications. Pneumonia, bronchitis, sinus infections and ear infections are examples of flu-related complications. If you have a medical condition, such as heart disease, cancer or diabetes, flu can make it worse. Flu can cause fever and chills, sore throat, muscle aches, fatigue, cough, headache, and runny or stuffy nose. Some people may have vomiting and diarrhea, though this is more common in children than adults. Each year thousands of people in the [place] die from flu, and many more are hospitalized. Flu vaccine prevents millions  of illnesses and flu-related visits to the doctor each year. 2. Influenza vaccine CDC recommends everyone 74 months of age and older get vaccinated every flu season. Children 6 months through 29 years of age may need 2 doses during a single flu season. Everyone else needs only 1 dose each flu season. It takes about 2 weeks for protection to develop after vaccination. There are many flu viruses, and they are always changing. Each year a new flu vaccine is made to protect against three or four viruses that are likely to cause disease in the upcoming flu season. Even when the vaccine doesn't exactly match these viruses, it may still provide some protection. Influenza vaccine does not cause flu. Influenza vaccine may be given at the same time as  other vaccines. 3. Talk with your health care provider Tell your vaccine provider if the person getting the vaccine:  Has had an allergic reaction after a previous dose of influenza vaccine, or has any severe, life-threatening allergies.  Has ever had Guillain-Barr Syndrome (also called GBS). In some cases, your health care provider may decide to postpone influenza vaccination to a future visit. People with minor illnesses, such as a cold, may be vaccinated. People who are moderately or severely ill should usually wait until they recover before getting influenza vaccine. Your health care provider can give you more information. 4. Risks of a vaccine reaction  Soreness, redness, and swelling where shot is given, fever, muscle aches, and headache can happen after influenza vaccine.  There may be a very small increased risk of Guillain-Barr Syndrome (GBS) after inactivated influenza vaccine (the flu shot). Young children who get the flu shot along with pneumococcal vaccine (PCV13), and/or DTaP vaccine at the same time might be slightly more likely to have a seizure caused by fever. Tell your health care provider if a child who is getting flu vaccine has ever had  a seizure. People sometimes faint after medical procedures, including vaccination. Tell your provider if you feel dizzy or have vision changes or ringing in the ears. As with any medicine, there is a very remote chance of a vaccine causing a severe allergic reaction, other serious injury, or death. 5. What if there is a serious problem? An allergic reaction could occur after the vaccinated person leaves the clinic. If you see signs of a severe allergic reaction (hives, swelling of the face and throat, difficulty breathing, a fast heartbeat, dizziness, or weakness), call 9-1-1 and get the person to the nearest hospital. For other signs that concern you, call your health care provider. Adverse reactions should be reported to the Vaccine Adverse Event Reporting System (VAERS). Your health care provider will usually file this report, or you can do it yourself. Visit the VAERS website at www.vaers.SamedayNews.es or call (980) 449-0875.VAERS is only for reporting reactions, and VAERS staff do not give medical advice. 6. The National Vaccine Injury Compensation Program The [employer] Vaccine Injury Compensation Program (VICP) is a federal program that was created to compensate people who may have been injured by certain vaccines. Visit the VICP website at GoldCloset.com.ee or call 223-884-5302 to learn about the program and about filing a claim. There is a time limit to file a claim for compensation. 7. How can I learn more?  Ask your healthcare provider.  Call your local or state health department.  Contact the Centers for Disease Control and Prevention (CDC): ? Call (949)274-6328 (1-800-CDC-INFO) or ? Visit CDC's https://gibson.com/ Vaccine Information Statement (Interim) Inactivated Influenza Vaccine (12/25/2017) This information is not intended to replace advice given to you by your health care provider. Make sure you discuss any questions you have with your health care provider. Document  Released: 02/21/2006 Document Revised: 08/18/2018 Document Reviewed: 12/29/2017 Elsevier Patient Education  2020 [employer].

## 2019-02-19 NOTE — Assessment & Plan Note (Signed)
She tolerated treatment well except for fatigue She is developing more anemic I recommend delaying her next dose for a few days to allow some blood count recovery and she agreed The plan would be to complete 4 cycles of treatment followed by repeat imaging study and then radiation treatment

## 2019-02-22 ENCOUNTER — Telehealth: Payer: Medicare Other | Admitting: Radiation Oncology

## 2019-02-25 ENCOUNTER — Other Ambulatory Visit: Payer: Self-pay | Admitting: Emergency Medicine

## 2019-02-25 ENCOUNTER — Telehealth: Payer: Self-pay | Admitting: Oncology

## 2019-02-25 DIAGNOSIS — R0602 Shortness of breath: Secondary | ICD-10-CM

## 2019-02-25 NOTE — Telephone Encounter (Signed)
[name] called and said she is feeling short of breath, weak and shaky.  She said it happened yesterday afternoon and then went away.  She felt fine this morning and was able to go on a walk but now the shortness of breath and shakiness is back.  She denies having any chest pain and does not feel dehydrated. She is wondering what to do.    Called Liza, RN in Symptom Management and patient can be seen tomorrow morning with chest x-ray at 9 am, labs at 9:30 and symptom management at 10.    Called Avneet back and discussed that she can go to the ER tonight or can be seen by symptom management tomorrow morning.  Also advised her that Dr. Alvy Bimler will be notified in the morning and that I will call her if Dr. Alvy Bimler has an opening.  She verbalized agreement and will plan on coming in for the chest x ray at 9 am.

## 2019-02-26 ENCOUNTER — Ambulatory Visit (HOSPITAL_COMMUNITY)
Admission: RE | Admit: 2019-02-26 | Discharge: 2019-02-26 | Disposition: A | Discharge status: Home / Self Care | Payer: Medicare Other | Source: Ambulatory Visit | Attending: Medical | Admitting: Medical

## 2019-02-26 ENCOUNTER — Other Ambulatory Visit: Payer: Self-pay

## 2019-02-26 ENCOUNTER — Inpatient Hospital Stay (HOSPITAL_BASED_OUTPATIENT_CLINIC_OR_DEPARTMENT_OTHER): Payer: Medicare Other | Admitting: Medical

## 2019-02-26 ENCOUNTER — Inpatient Hospital Stay: Payer: Medicare Other

## 2019-02-26 VITALS — BP 171/68 | HR 66 | Temp 97.8°F | Resp 18 | Ht 59.0 in | Wt 111.0 lb

## 2019-02-26 DIAGNOSIS — R0602 Shortness of breath: Secondary | ICD-10-CM | POA: Insufficient documentation

## 2019-02-26 DIAGNOSIS — C3431 Malignant neoplasm of lower lobe, right bronchus or lung: Secondary | ICD-10-CM | POA: Diagnosis not present

## 2019-02-26 DIAGNOSIS — C3491 Malignant neoplasm of unspecified part of right bronchus or lung: Secondary | ICD-10-CM | POA: Diagnosis not present

## 2019-02-26 DIAGNOSIS — R0609 Other forms of dyspnea: Secondary | ICD-10-CM

## 2019-02-26 DIAGNOSIS — C7951 Secondary malignant neoplasm of bone: Secondary | ICD-10-CM

## 2019-02-26 DIAGNOSIS — Z5111 Encounter for antineoplastic chemotherapy: Secondary | ICD-10-CM | POA: Diagnosis not present

## 2019-02-26 DIAGNOSIS — R0600 Dyspnea, unspecified: Secondary | ICD-10-CM

## 2019-02-26 DIAGNOSIS — C541 Malignant neoplasm of endometrium: Secondary | ICD-10-CM

## 2019-02-26 DIAGNOSIS — C549 Malignant neoplasm of corpus uteri, unspecified: Secondary | ICD-10-CM

## 2019-02-26 DIAGNOSIS — R5383 Other fatigue: Secondary | ICD-10-CM | POA: Diagnosis not present

## 2019-02-26 LAB — CBC WITH DIFFERENTIAL (CANCER CENTER ONLY)
Abs Immature Granulocytes: 0.01 10*3/uL (ref 0.00–0.07)
Basophils Absolute: 0 10*3/uL (ref 0.0–0.1)
Basophils Relative: 1 %
Eosinophils Absolute: 0 10*3/uL (ref 0.0–0.5)
Eosinophils Relative: 1 %
HCT: 29.1 % — ABNORMAL LOW (ref 36.0–46.0)
Hemoglobin: 9.5 g/dL — ABNORMAL LOW (ref 12.0–15.0)
Immature Granulocytes: 1 %
Lymphocytes Relative: 41 %
Lymphs Abs: 0.7 10*3/uL (ref 0.7–4.0)
MCH: 31.1 pg (ref 26.0–34.0)
MCHC: 32.6 g/dL (ref 30.0–36.0)
MCV: 95.4 fL (ref 80.0–100.0)
Monocytes Absolute: 0.2 10*3/uL (ref 0.1–1.0)
Monocytes Relative: 13 %
Neutro Abs: 0.7 10*3/uL — ABNORMAL LOW (ref 1.7–7.7)
Neutrophils Relative %: 43 %
Platelet Count: 117 10*3/uL — ABNORMAL LOW (ref 150–400)
RBC: 3.05 MIL/uL — ABNORMAL LOW (ref 3.87–5.11)
RDW: 15.7 % — ABNORMAL HIGH (ref 11.5–15.5)
WBC Count: 1.7 10*3/uL — ABNORMAL LOW (ref 4.0–10.5)
nRBC: 0 % (ref 0.0–0.2)

## 2019-02-26 LAB — CMP (CANCER CENTER ONLY)
ALT: 22 U/L (ref 0–44)
AST: 30 U/L (ref 15–41)
Albumin: 3.5 g/dL (ref 3.5–5.0)
Alkaline Phosphatase: 52 U/L (ref 38–126)
Anion gap: 10 (ref 5–15)
BUN: 21 mg/dL (ref 8–23)
CO2: 25 mmol/L (ref 22–32)
Calcium: 9.1 mg/dL (ref 8.9–10.3)
Chloride: 106 mmol/L (ref 98–111)
Creatinine: 0.8 mg/dL (ref 0.44–1.00)
GFR, Est AFR Am: >60 mL/min (ref >60)
GFR, Estimated: >60 mL/min (ref >60)
Glucose, Bld: 87 mg/dL (ref 70–99)
Potassium: 4.1 mmol/L (ref 3.5–5.1)
Sodium: 141 mmol/L (ref 135–145)
Total Bilirubin: 0.7 mg/dL (ref 0.3–1.2)
Total Protein: 6.6 g/dL (ref 6.5–8.1)

## 2019-02-26 NOTE — Telephone Encounter (Signed)
Agree with symptom management

## 2019-02-26 NOTE — Progress Notes (Signed)
[facility] Progress Note   [name] [id number] [birth year] 78 y.o.  [name] is managed by Dr. Heath Lark  Actively treated with chemotherapy/immunotherapy/hormonal therapy: yes  Current therapy: Carboplatin and Alimta  Last treated: 02/19/2019 (cycle 3, day 1)  Next scheduled appointment with provider: 03/15/2019  Assessment: Plan:    Primary lung cancer, right ([place])  Endometrial cancer ([place])  SOB (shortness of breath)  Dyspnea on exertion   Primary lung cancer: The patient continues to be managed by Dr. Heath Lark and is status post cycle 3, day 1 of carboplatin and Alimta which was dosed on 02/19/2019.  She is scheduled to be seen in follow-up on 03/29/2019.  Shortness of breath and dyspnea on exertion: Patient shortness of breath dyspnea on exertion developed since her last chemotherapy.  Her oxygen saturation was noted to be 100% today.  She was referred for a chest x-ray which returned negative.  A complete blood count also returned showing a progressive drop in her hemoglobin since the start of her treatment.  Her hemoglobin today is at 9.5.  The patient's oxygen level was measured while she was at rest and was ambulating.  It dropped to 86 at one time to 82 at another time.  Both of these were when the patient was walking and talking at the same return.  Her oxygen level immediately improved to 99% on room air while walking once she stopped talking.  She was offered home oxygen which she declined.  Please see After Visit Summary for patient specific instructions.  Future Appointments  Date Time Provider [place]  03/15/2019  8:15 AM [facility] LAB 2 [facility] None  03/15/2019  8:45 AM [facility] [place] FLUSH [facility] None  03/15/2019  9:15 AM Alvy Bimler, Ni, MD [facility] None  03/15/2019 10:00 AM [facility] INFUSION [facility] None  04/01/2019 12:00 PM [facility] 1 [facility] [place]  04/06/2019 10:30 AM Hayden Pedro, PA-C  [facility] None    No orders of the defined types were placed in this encounter.      Subjective:   Patient ID:  [name] is a 78 y.o. (DOB Jun 30, 1940) female.  Chief Complaint: No chief complaint on file.   HPI [name]  Is a 78 y.o. female with a diagnosis of a primary lung cancer.  She is managed by Dr. Heath Lark.  She is status post cycle 3, day 1 of carboplatin Alimta which she received on 02/19/2019.  The patient also has a history of and endometrial cancer.  She presents to the clinic today with shortness of breath and dyspnea on exertion which started on Wednesday.  Her last chemotherapy was Friday.  She also reports that she is having some shortness of breath at rest.  Her oxygen saturation today was noted to be 100% on room air.  She was referred for a chest x-ray which returned showing:  Left-sided Port-A-Cath remains in place, tip in the mid SVC.  Lungs are clear. No signs of effusion or consolidation.  Changes of right axillary dissection are present.  Spinal degenerative changes with moderate to marked curvature, levoconvexity centered about the thoracolumbar junction similar to prior study. No signs of acute bone finding.    Medications: I have reviewed the patient's current medications.  Allergies:  Allergies  Allergen Reactions  . Ace Inhibitors Anaphylaxis    Shortness of breath   . Codeine Nausea Only  . Demerol  [Meperidine Hcl]   . Dilaudid [Hydromorphone Hcl] Other (See  Comments)    "total loss of her mind"    Past Medical History:  Diagnosis Date  . Endometrial cancer ([place]) 12/2007   s/p total abdominal hysterectomy at [facility] - ovaries were also removed   . Family history of breast cancer   . History of breast cancer    right breast -- treated with lumpectomy and radiation therarpy, postoperatively with tamoxifen   . History of radiation therapy 10/22/2016 to 10/28/2016  . Hypercholesterolemia   . Hyperlipidemia   .  Hypertension   . Palpitations   . Personal history of radiation therapy 2006  . PVC's (premature ventricular contractions)   . Radiation 07/11/14-08/12/14   left lumbar paraspinal area 55 gray  . S/P radiation therapy 10/28/11-12/05/11   5040 cGy left pelvis  . S/P radiation therapy    Intracavitary brachytherapy of uterus    Past Surgical History:  Procedure Laterality Date  . APPENDECTOMY    . BREAST LUMPECTOMY Right 2000  . COLONOSCOPY     3X  . FOOT SURGERY     RIGHT  . SEGMENTECOMY Right 12/07/2018   Procedure: RIGHT LOWER LOBE SUPERIOR SEGMENTECTOMY;  Surgeon: Melrose Nakayama, MD;  Location: [facility];  Service: Thoracic;  Laterality: Right;  . TONSILLECTOMY    . TOTAL ABDOMINAL HYSTERECTOMY W/ BILATERAL SALPINGOOPHORECTOMY    . VIDEO ASSISTED THORACOSCOPY Right 12/07/2018   Procedure: VIDEO ASSISTED THORACOSCOPY;  Surgeon: Melrose Nakayama, MD;  Location: [facility] OR;  Service: Thoracic;  Laterality: Right;    Family History  Problem Relation Age of Onset  . Thrombosis Father        coronary thrombosis  . Hypertension Father   . Heart failure Mother   . Coronary artery disease Brother   . Breast cancer Maternal Aunt        dx in her 108s  . Stroke Maternal Uncle   . Stroke Maternal Grandfather     Social History   Socioeconomic History  . Marital status: Married    Spouse name: [name]  . Number of children: 2  . Years of education: Not on file  . Highest education level: Not on file  Occupational History  . Occupation: retired  Scientific laboratory technician  . Financial resource strain: Not on file  . Food insecurity    Worry: Not on file    Inability: Not on file  . Transportation needs    Medical: Not on file    Non-medical: Not on file  Tobacco Use  . Smoking status: Former Smoker    Types: Cigarettes    Quit date: 05/13/1962    Years since quitting: 56.8  . Smokeless tobacco: Never Used  Substance and Sexual Activity  . Alcohol use: Yes    Comment: ocass  . Drug  use: No  . Sexual activity: Never  Lifestyle  . Physical activity    Days per week: Not on file    Minutes per session: Not on file  . Stress: Not on file  Relationships  . Social Herbalist on phone: Not on file    Gets together: Not on file    Attends religious service: Not on file    Active member of club or organization: Not on file    Attends meetings of clubs or organizations: Not on file    Relationship status: Not on file  . Intimate partner violence    Fear of current or ex partner: Not on file    Emotionally abused: Not  on file    Physically abused: Not on file    Forced sexual activity: Not on file  Other Topics Concern  . Not on file  Social History Narrative  . Not on file    Past Medical History, Surgical history, Social history, and Family history were reviewed and updated as appropriate.   Please see review of systems for further details on the patient's review from today.   Review of Systems:  Review of Systems  Constitutional: Negative for chills, diaphoresis and fever.  HENT: Negative for trouble swallowing.   Respiratory: Positive for shortness of breath. Negative for cough, choking, chest tightness, wheezing and stridor.   Cardiovascular: Negative for chest pain and palpitations.    Objective:   Physical Exam:  BP (!) 171/68 (BP Location: Right Arm, Patient Position: Sitting) Comment: LPN Santiago Glad aware of BP  Pulse 66   Temp 97.8 F (36.6 C) (Temporal)   Resp 18   Ht 4\' 11"  (1.499 m)   Wt 111 lb (50.3 kg)   SpO2 100%   BMI 22.42 kg/m  ECOG: 0  Oxygen Saturation:  Room air at rest:    100 % Room air with ambulation:   99 % Room air with ambulation while talking: 82 %   Physical Exam Constitutional:      General: She is not in acute distress.    Appearance: She is not diaphoretic.  HENT:     Head: Normocephalic and atraumatic.  Cardiovascular:     Rate and Rhythm: Normal rate and regular rhythm.     Heart sounds: Normal  heart sounds. No murmur. No friction rub. No gallop.   Pulmonary:     Effort: Pulmonary effort is normal. No respiratory distress.     Breath sounds: Normal breath sounds. No stridor. No wheezing or rales.  Skin:    General: Skin is warm and dry.     Coloration: Skin is not pale.     Findings: No erythema.  Neurological:     Mental Status: She is alert.  Psychiatric:        Behavior: Behavior normal.        Thought Content: Thought content normal.        Judgment: Judgment normal.     Lab Review:     Component Value Date/Time   NA 141 02/26/2019 0935   NA 140 05/09/2017 0844   K 4.1 02/26/2019 0935   K 4.1 05/09/2017 0844   CL 106 02/26/2019 0935   CO2 25 02/26/2019 0935   CO2 26 05/09/2017 0844   GLUCOSE 87 02/26/2019 0935   GLUCOSE 82 05/09/2017 0844   BUN 21 02/26/2019 0935   BUN 20.7 05/09/2017 0844   CREATININE 0.80 02/26/2019 0935   CREATININE 0.7 05/09/2017 0844   CALCIUM 9.1 02/26/2019 0935   CALCIUM 9.3 05/09/2017 0844   PROT 6.6 02/26/2019 0935   PROT 6.8 05/09/2017 0844   ALBUMIN 3.5 02/26/2019 0935   ALBUMIN 3.6 05/09/2017 0844   AST 30 02/26/2019 0935   AST 27 05/09/2017 0844   ALT 22 02/26/2019 0935   ALT 17 05/09/2017 0844   ALKPHOS 52 02/26/2019 0935   ALKPHOS 43 05/09/2017 0844   BILITOT 0.7 02/26/2019 0935   BILITOT 0.81 05/09/2017 0844   GFRNONAA >60 02/26/2019 0935   GFRAA >60 02/26/2019 0935       Component Value Date/Time   WBC 1.7 (L) 02/26/2019 0935   WBC 7.4 01/07/2019 0840   RBC 3.05 (L) 02/26/2019 0935  HGB 9.5 (L) 02/26/2019 0935   HGB 12.2 05/09/2017 0844   HCT 29.1 (L) 02/26/2019 0935   HCT 36.9 05/09/2017 0844   PLT 117 (L) 02/26/2019 0935   PLT 180 05/09/2017 0844   MCV 95.4 02/26/2019 0935   MCV 96.7 05/09/2017 0844   MCH 31.1 02/26/2019 0935   MCHC 32.6 02/26/2019 0935   RDW 15.7 (H) 02/26/2019 0935   RDW 13.4 05/09/2017 0844   LYMPHSABS PENDING 02/26/2019 0935   LYMPHSABS 1.0 05/09/2017 0844   MONOABS PENDING  02/26/2019 0935   MONOABS 0.8 05/09/2017 0844   EOSABS PENDING 02/26/2019 0935   EOSABS 0.3 05/09/2017 0844   BASOSABS PENDING 02/26/2019 0935   BASOSABS 0.0 05/09/2017 0844   -------------------------------  Imaging from last 24 hours (if applicable):  Radiology interpretation: Dg Chest 2 View  Result Date: 02/26/2019 CLINICAL DATA:  Shortness of breath, gradual worsening. EXAM: CHEST - 2 VIEW COMPARISON:  02/09/2019 FINDINGS: Left-sided Port-A-Cath remains in place, tip in the mid SVC. Lungs are clear. No signs of effusion or consolidation. Changes of right axillary dissection are present. Spinal degenerative changes with moderate to marked curvature, levoconvexity centered about the thoracolumbar junction similar to prior study. No signs of acute bone finding. IMPRESSION: 1. No active cardiopulmonary disease. Electronically Signed   By: Zetta Bills M.D.   On: 02/26/2019 09:17   Dg Chest 2 View  Result Date: 02/09/2019 CLINICAL DATA:  Lung cancer.  Status post VATS EXAM: CHEST - 2 VIEW COMPARISON:  12/21/2018 FINDINGS: Left chest wall port a catheter is noted with tip in the projection of the SVC. Normal heart size. Resolution of previous small right apex pneumothorax and right pleural effusion. No airspace densities. Previous right axillary nodal dissection. IMPRESSION: No active cardiopulmonary disease. Resolution of right apex pneumothorax and right pleural effusion. Electronically Signed   By: Kerby Moors M.D.   On: 02/09/2019 13:49        This case was discussed with Dr. Alvy Bimler. She expresses agreement with my management of this patient.

## 2019-02-26 NOTE — Telephone Encounter (Signed)
Called [name] and confirmed that she should come in as we had discussed yesterday for chest x ray/labs/symptom mamangement this morning.  She verbalized agreement and said she feels better this morning but the shortness of breath, weakness comes and goes.

## 2019-03-12 ENCOUNTER — Ambulatory Visit: Payer: Medicare Other

## 2019-03-12 ENCOUNTER — Ambulatory Visit: Payer: Medicare Other | Admitting: Hematology and Oncology

## 2019-03-12 ENCOUNTER — Other Ambulatory Visit: Payer: Medicare Other

## 2019-03-15 ENCOUNTER — Inpatient Hospital Stay: Payer: Medicare Other

## 2019-03-15 ENCOUNTER — Other Ambulatory Visit: Payer: Self-pay

## 2019-03-15 ENCOUNTER — Inpatient Hospital Stay (HOSPITAL_BASED_OUTPATIENT_CLINIC_OR_DEPARTMENT_OTHER): Payer: Medicare Other | Admitting: Hematology and Oncology

## 2019-03-15 ENCOUNTER — Encounter: Payer: Self-pay | Admitting: Hematology and Oncology

## 2019-03-15 ENCOUNTER — Telehealth: Payer: Self-pay | Admitting: Hematology and Oncology

## 2019-03-15 ENCOUNTER — Inpatient Hospital Stay: Payer: Medicare Other | Attending: Hematology and Oncology

## 2019-03-15 VITALS — BP 147/63 | HR 61

## 2019-03-15 VITALS — BP 172/79 | HR 72 | Temp 97.8°F | Resp 18 | Ht 59.0 in | Wt 113.3 lb

## 2019-03-15 DIAGNOSIS — C3431 Malignant neoplasm of lower lobe, right bronchus or lung: Secondary | ICD-10-CM | POA: Insufficient documentation

## 2019-03-15 DIAGNOSIS — Z8249 Family history of ischemic heart disease and other diseases of the circulatory system: Secondary | ICD-10-CM | POA: Diagnosis not present

## 2019-03-15 DIAGNOSIS — Z90722 Acquired absence of ovaries, bilateral: Secondary | ICD-10-CM | POA: Diagnosis not present

## 2019-03-15 DIAGNOSIS — I10 Essential (primary) hypertension: Secondary | ICD-10-CM

## 2019-03-15 DIAGNOSIS — Z823 Family history of stroke: Secondary | ICD-10-CM | POA: Insufficient documentation

## 2019-03-15 DIAGNOSIS — C541 Malignant neoplasm of endometrium: Secondary | ICD-10-CM | POA: Insufficient documentation

## 2019-03-15 DIAGNOSIS — J320 Chronic maxillary sinusitis: Secondary | ICD-10-CM | POA: Diagnosis not present

## 2019-03-15 DIAGNOSIS — Z9221 Personal history of antineoplastic chemotherapy: Secondary | ICD-10-CM | POA: Diagnosis not present

## 2019-03-15 DIAGNOSIS — R42 Dizziness and giddiness: Secondary | ICD-10-CM | POA: Insufficient documentation

## 2019-03-15 DIAGNOSIS — Z95828 Presence of other vascular implants and grafts: Secondary | ICD-10-CM

## 2019-03-15 DIAGNOSIS — C3491 Malignant neoplasm of unspecified part of right bronchus or lung: Secondary | ICD-10-CM

## 2019-03-15 DIAGNOSIS — C549 Malignant neoplasm of corpus uteri, unspecified: Secondary | ICD-10-CM | POA: Diagnosis not present

## 2019-03-15 DIAGNOSIS — D701 Agranulocytosis secondary to cancer chemotherapy: Secondary | ICD-10-CM

## 2019-03-15 DIAGNOSIS — Z5111 Encounter for antineoplastic chemotherapy: Secondary | ICD-10-CM | POA: Diagnosis not present

## 2019-03-15 DIAGNOSIS — C3490 Malignant neoplasm of unspecified part of unspecified bronchus or lung: Secondary | ICD-10-CM

## 2019-03-15 DIAGNOSIS — Z923 Personal history of irradiation: Secondary | ICD-10-CM | POA: Diagnosis not present

## 2019-03-15 DIAGNOSIS — D61818 Other pancytopenia: Secondary | ICD-10-CM | POA: Diagnosis not present

## 2019-03-15 DIAGNOSIS — R0602 Shortness of breath: Secondary | ICD-10-CM | POA: Insufficient documentation

## 2019-03-15 DIAGNOSIS — I700 Atherosclerosis of aorta: Secondary | ICD-10-CM | POA: Diagnosis not present

## 2019-03-15 DIAGNOSIS — C775 Secondary and unspecified malignant neoplasm of intrapelvic lymph nodes: Secondary | ICD-10-CM | POA: Diagnosis not present

## 2019-03-15 DIAGNOSIS — Z803 Family history of malignant neoplasm of breast: Secondary | ICD-10-CM | POA: Insufficient documentation

## 2019-03-15 DIAGNOSIS — T451X5A Adverse effect of antineoplastic and immunosuppressive drugs, initial encounter: Secondary | ICD-10-CM

## 2019-03-15 DIAGNOSIS — M4802 Spinal stenosis, cervical region: Secondary | ICD-10-CM | POA: Insufficient documentation

## 2019-03-15 DIAGNOSIS — K5730 Diverticulosis of large intestine without perforation or abscess without bleeding: Secondary | ICD-10-CM | POA: Diagnosis not present

## 2019-03-15 DIAGNOSIS — Z87891 Personal history of nicotine dependence: Secondary | ICD-10-CM | POA: Insufficient documentation

## 2019-03-15 DIAGNOSIS — Z885 Allergy status to narcotic agent status: Secondary | ICD-10-CM | POA: Insufficient documentation

## 2019-03-15 DIAGNOSIS — C7951 Secondary malignant neoplasm of bone: Secondary | ICD-10-CM | POA: Diagnosis not present

## 2019-03-15 DIAGNOSIS — Z79899 Other long term (current) drug therapy: Secondary | ICD-10-CM | POA: Diagnosis not present

## 2019-03-15 DIAGNOSIS — R0609 Other forms of dyspnea: Secondary | ICD-10-CM | POA: Insufficient documentation

## 2019-03-15 DIAGNOSIS — I2510 Atherosclerotic heart disease of native coronary artery without angina pectoris: Secondary | ICD-10-CM | POA: Insufficient documentation

## 2019-03-15 DIAGNOSIS — K8020 Calculus of gallbladder without cholecystitis without obstruction: Secondary | ICD-10-CM | POA: Diagnosis not present

## 2019-03-15 DIAGNOSIS — Z853 Personal history of malignant neoplasm of breast: Secondary | ICD-10-CM

## 2019-03-15 LAB — CMP (CANCER CENTER ONLY)
ALT: 19 U/L (ref 0–44)
AST: 30 U/L (ref 15–41)
Albumin: 3.6 g/dL (ref 3.5–5.0)
Alkaline Phosphatase: 52 U/L (ref 38–126)
Anion gap: 10 (ref 5–15)
BUN: 19 mg/dL (ref 8–23)
CO2: 23 mmol/L (ref 22–32)
Calcium: 9.4 mg/dL (ref 8.9–10.3)
Chloride: 109 mmol/L (ref 98–111)
Creatinine: 0.86 mg/dL (ref 0.44–1.00)
GFR, Est AFR Am: >60 mL/min (ref >60)
GFR, Estimated: >60 mL/min (ref >60)
Glucose, Bld: 79 mg/dL (ref 70–99)
Potassium: 4 mmol/L (ref 3.5–5.1)
Sodium: 142 mmol/L (ref 135–145)
Total Bilirubin: 0.5 mg/dL (ref 0.3–1.2)
Total Protein: 6.9 g/dL (ref 6.5–8.1)

## 2019-03-15 LAB — CBC WITH DIFFERENTIAL (CANCER CENTER ONLY)
Abs Immature Granulocytes: 0.04 10*3/uL (ref 0.00–0.07)
Basophils Absolute: 0 10*3/uL (ref 0.0–0.1)
Basophils Relative: 1 %
Eosinophils Absolute: 0.1 10*3/uL (ref 0.0–0.5)
Eosinophils Relative: 1 %
HCT: 29.6 % — ABNORMAL LOW (ref 36.0–46.0)
Hemoglobin: 9.7 g/dL — ABNORMAL LOW (ref 12.0–15.0)
Immature Granulocytes: 1 %
Lymphocytes Relative: 20 %
Lymphs Abs: 1 10*3/uL (ref 0.7–4.0)
MCH: 31.5 pg (ref 26.0–34.0)
MCHC: 32.8 g/dL (ref 30.0–36.0)
MCV: 96.1 fL (ref 80.0–100.0)
Monocytes Absolute: 1.1 10*3/uL — ABNORMAL HIGH (ref 0.1–1.0)
Monocytes Relative: 21 %
Neutro Abs: 2.9 10*3/uL (ref 1.7–7.7)
Neutrophils Relative %: 56 %
Platelet Count: 174 10*3/uL (ref 150–400)
RBC: 3.08 MIL/uL — ABNORMAL LOW (ref 3.87–5.11)
RDW: 18.7 % — ABNORMAL HIGH (ref 11.5–15.5)
WBC Count: 5.1 10*3/uL (ref 4.0–10.5)
nRBC: 0 % (ref 0.0–0.2)

## 2019-03-15 MED ORDER — CYANOCOBALAMIN 1000 MCG/ML IJ SOLN
1000.0000 ug | Freq: Once | INTRAMUSCULAR | Status: AC
  Administered 2019-03-15: 10:00:00 1000 ug via INTRAMUSCULAR

## 2019-03-15 MED ORDER — SODIUM CHLORIDE 0.9 % IV SOLN
302.0000 mg | Freq: Once | INTRAVENOUS | Status: AC
  Administered 2019-03-15: 13:00:00 300 mg via INTRAVENOUS
  Filled 2019-03-15: qty 30

## 2019-03-15 MED ORDER — SODIUM CHLORIDE 0.9 % IV SOLN
500.0000 mg/m2 | Freq: Once | INTRAVENOUS | Status: AC
  Administered 2019-03-15: 700 mg via INTRAVENOUS
  Filled 2019-03-15: qty 8

## 2019-03-15 MED ORDER — FAMOTIDINE IN NACL 20-0.9 MG/50ML-% IV SOLN
20.0000 mg | Freq: Once | INTRAVENOUS | Status: AC
  Administered 2019-03-15: 20 mg via INTRAVENOUS

## 2019-03-15 MED ORDER — PALONOSETRON HCL INJECTION 0.25 MG/5ML
0.2500 mg | Freq: Once | INTRAVENOUS | Status: AC
  Administered 2019-03-15: 10:00:00 0.25 mg via INTRAVENOUS

## 2019-03-15 MED ORDER — SODIUM CHLORIDE 0.9 % IV SOLN
Freq: Once | INTRAVENOUS | Status: AC
  Administered 2019-03-15: 10:00:00 via INTRAVENOUS
  Filled 2019-03-15: qty 250

## 2019-03-15 MED ORDER — PALONOSETRON HCL INJECTION 0.25 MG/5ML
INTRAVENOUS | Status: AC
  Filled 2019-03-15: qty 5

## 2019-03-15 MED ORDER — SODIUM CHLORIDE 0.9% FLUSH
10.0000 mL | Freq: Once | INTRAVENOUS | Status: AC
  Administered 2019-03-15: 09:00:00 10 mL
  Filled 2019-03-15: qty 10

## 2019-03-15 MED ORDER — SODIUM CHLORIDE 0.9 % IV SOLN
Freq: Once | INTRAVENOUS | Status: AC
  Administered 2019-03-15: 11:00:00 via INTRAVENOUS
  Filled 2019-03-15: qty 5

## 2019-03-15 MED ORDER — DIPHENHYDRAMINE HCL 50 MG/ML IJ SOLN
25.0000 mg | Freq: Once | INTRAMUSCULAR | Status: AC
  Administered 2019-03-15: 25 mg via INTRAVENOUS

## 2019-03-15 MED ORDER — FAMOTIDINE IN NACL 20-0.9 MG/50ML-% IV SOLN
INTRAVENOUS | Status: AC
  Filled 2019-03-15: qty 50

## 2019-03-15 MED ORDER — SODIUM CHLORIDE 0.9% FLUSH
10.0000 mL | INTRAVENOUS | Status: DC | PRN
  Administered 2019-03-15: 13:00:00 10 mL
  Filled 2019-03-15: qty 10

## 2019-03-15 MED ORDER — HEPARIN SOD (PORK) LOCK FLUSH 100 UNIT/ML IV SOLN
500.0000 [IU] | Freq: Once | INTRAVENOUS | Status: AC | PRN
  Administered 2019-03-15: 500 [IU]
  Filled 2019-03-15: qty 5

## 2019-03-15 MED ORDER — DIPHENHYDRAMINE HCL 50 MG/ML IJ SOLN
INTRAMUSCULAR | Status: AC
  Filled 2019-03-15: qty 1

## 2019-03-15 MED ORDER — CYANOCOBALAMIN 1000 MCG/ML IJ SOLN
INTRAMUSCULAR | Status: AC
  Filled 2019-03-15: qty 1

## 2019-03-15 NOTE — Telephone Encounter (Signed)
Scheduled appt per 11/2 sch message - unable to reach pt . Left message with appt date and time

## 2019-03-15 NOTE — Patient Instructions (Signed)
[place] Discharge Instructions for Patients Receiving Chemotherapy  Today you received the following chemotherapy agents: Pemetrexed (Alimta) and Carboplatin  To help prevent nausea and vomiting after your treatment, we encourage you to take your nausea medication as directed by your MD.   If you develop nausea and vomiting that is not controlled by your nausea medication, call the clinic.   BELOW ARE SYMPTOMS THAT SHOULD BE REPORTED IMMEDIATELY:  *FEVER GREATER THAN 100.5 F  *CHILLS WITH OR WITHOUT FEVER  NAUSEA AND VOMITING THAT IS NOT CONTROLLED WITH YOUR NAUSEA MEDICATION  *UNUSUAL SHORTNESS OF BREATH  *UNUSUAL BRUISING OR BLEEDING  TENDERNESS IN MOUTH AND THROAT WITH OR WITHOUT PRESENCE OF ULCERS  *URINARY PROBLEMS  *BOWEL PROBLEMS  UNUSUAL RASH Items with * indicate a potential emergency and should be followed up as soon as possible.  Feel free to call the clinic should you have any questions or concerns. The clinic phone number is [phone].  Please show the [facility] at check-in to the Emergency Department and triage nurse. Coronavirus (COVID-19) Are you at risk?  Are you at risk for the Coronavirus (COVID-19)?  To be considered HIGH RISK for Coronavirus (COVID-19), you have to meet the following criteria:  . Traveled to [place], [place], [place], [place] or [place]; or in the [place] to [place], [place], [place], or [place]; and have fever, cough, and shortness of breath within the last 2 weeks of travel OR . Been in close contact with a person diagnosed with COVID-19 within the last 2 weeks and have fever, cough, and shortness of breath . IF YOU DO NOT MEET THESE CRITERIA, YOU ARE CONSIDERED LOW RISK FOR COVID-19.  What to do if you are HIGH RISK for COVID-19?  Marland Kitchen If you are having a medical emergency, call 911. . Seek medical care right away. Before you go to a doctor's office, urgent care or emergency department, call  ahead and tell them about your recent travel, contact with someone diagnosed with COVID-19, and your symptoms. You should receive instructions from your physician's office regarding next steps of care.  . When you arrive at healthcare provider, tell the healthcare staff immediately you have returned from visiting [place], [place], [place], [place] or [place]; or traveled in the [place] to [place], [place], [place], or [place]; in the last two weeks or you have been in close contact with a person diagnosed with COVID-19 in the last 2 weeks.   . Tell the health care staff about your symptoms: fever, cough and shortness of breath. . After you have been seen by a medical provider, you will be either: o Tested for (COVID-19) and discharged home on quarantine except to seek medical care if symptoms worsen, and asked to  - Stay home and avoid contact with others until you get your results (4-5 days)  - Avoid travel on public transportation if possible (such as bus, train, or airplane) or o Sent to the Emergency Department by EMS for evaluation, COVID-19 testing, and possible admission depending on your condition and test results.  What to do if you are LOW RISK for COVID-19?  Reduce your risk of any infection by using the same precautions used for avoiding the common cold or flu:  Marland Kitchen Wash your hands often with soap and warm water for at least 20 seconds.  If soap and water are not readily available, use an alcohol-based hand sanitizer with at least 60% alcohol.  Marland Kitchen  If coughing or sneezing, cover your mouth and nose by coughing or sneezing into the elbow areas of your shirt or coat, into a tissue or into your sleeve (not your hands). . Avoid shaking hands with others and consider head nods or verbal greetings only. . Avoid touching your eyes, nose, or mouth with unwashed hands.  . Avoid close contact with people who are sick. . Avoid places or events with large numbers of people in one location,  like concerts or sporting events. . Carefully consider travel plans you have or are making. . If you are planning any travel outside or inside the [place], visit the CDC's Travelers' Health webpage for the latest health notices. . If you have some symptoms but not all symptoms, continue to monitor at home and seek medical attention if your symptoms worsen. . If you are having a medical emergency, call 911.   [employer] / e-Visit: [email or web address]         [facility]: [facility]: [phone]                   [facility]: [phone]

## 2019-03-15 NOTE — Progress Notes (Signed)
[place] OFFICE PROGRESS NOTE  Patient Care Team: Shon Baton, MD as PCP - General (Internal Medicine) Heath Lark, MD as Consulting Physician (Hematology and Oncology) Heath Lark, MD as Consulting Physician (Hematology and Oncology) Nancy Marus, MD as Attending Physician (Gynecologic Oncology)  ASSESSMENT & PLAN:  Primary lung cancer, right [facility]) She will complete cycle 4 of treatment today I plan to repeat PET CT scan at the end of the month She has appointment next month to discuss adjuvant radiation therapy with radiation oncologist I plan to see her back in January for further follow-up  Pancytopenia, acquired [facility]) She has intermittent pancytopenia due to treatment She is mildly symptomatic from recent anemia She is taking oral iron supplement  White coat syndrome with diagnosis of hypertension Her blood pressure is significantly elevated today due to anxiety She is not symptomatic Observe only for now   Orders Placed This Encounter  Procedures  . NM PET Image Restag (PS) Skull Base To Thigh    Standing Status:   Future    Standing Expiration Date:   03/14/2020    Order Specific Question:   If indicated for the ordered procedure, I authorize the administration of a radiopharmaceutical per Radiology protocol    Answer:   Yes    Order Specific Question:   Preferred imaging location?    Answer:   [facility]    Order Specific Question:   Radiology Contrast Protocol - do NOT remove file path    Answer:   [email or web address]    Order Specific Question:   ** REASON FOR EXAM (FREE TEXT)    Answer:   s/p surgery and chemo, exclude recurrence    INTERVAL HISTORY: Please see below for problem oriented charting. She is seen prior to cycle 4 of treatment She had recent symptomatic anemia with shortness of breath on exertion That is gradually getting better The patient denies any recent signs or symptoms of bleeding such as  spontaneous epistaxis, hematuria or hematochezia. She denies recent nausea or changes in bowel habits No recent infection, fever or chills  SUMMARY OF ONCOLOGIC HISTORY: Oncology History Overview Note  Biopsy of recurrence 06-2014 ER negative. PDL1 testing low at 2% on the uterine cancer HER 2 negative on pathology [id number] Negative genetic testing  On her lung cancer sample, [employer] [facility] testing revealed 10% PD-1 positive EGFR mutation was detected, exon 19 deletion   Malignant neoplasm of corpus uteri, except isthmus ([place])  12/22/2007 Surgery   Staging for IA UPSC    - 05/28/2008 Chemotherapy   6 cycles of paclitaxel and carboplatin with vaginal cuff brachytherapy   03/22/2011 Relapse/Recurrence   Left pelvic node recurrence   04/25/2011 Surgery   L/S evaluation, not resectable.   05/16/2011 - 09/25/2011 Chemotherapy   6 cyclces paclitaxel and carboplatin    - 12/27/2011 Radiation Therapy   radiation completed   03/10/2013 Progression   progression left pelvic mass   04/02/2013 Surgery   Resection and IORT   06/13/2014 Relapse/Recurrence   soft tissue mass at L3-4   07/11/2014 - 08/12/2014 Radiation Therapy   IMRT + zometa   02/12/2016 Progression      - 01/18/2016 Chemotherapy   The patient had ondansetron (ZOFRAN) IVPB 16 mg, 16 mg (100 % of original dose 16 mg), Intravenous,  Once, 5 of 5 cycles Dose modification: 16 mg (original dose 16 mg, Cycle 2)  CARBOplatin (PARAPLATIN) 390 mg in sodium chloride 0.9 % 100 mL chemo infusion, 390 mg (100 %  of original dose 392 mg), Intravenous,  Once, 5 of 5 cycles Dose modification: 392 mg (original dose 392 mg, Cycle 1), 446.5 mg (original dose 392 mg, Cycle 2), 390 mg (original dose 392 mg, Cycle 2), 342 mg (original dose 392 mg, Cycle 6)  CARBOplatin chemo intradermal Test Dose 100 mcg, 0.1 mg, Intradermal,  Once, 5 of 5 cycles  PACLitaxel (TAXOL) 252 mg in dextrose 5 % 250 mL chemo infusion (> 64m/m2), 175 mg/m2 = 252 mg,  Intravenous,  Once, 6 of 6 cycles Dose modification: 135 mg/m2 (original dose 175 mg/m2, Cycle 5)  CARBOplatin (PARAPLATIN) 250 mg in sodium chloride 0.9 % 100 mL chemo infusion, 250 mg (100 % of original dose 254.4 mg), Intravenous,  Once, 6 of 6 cycles Dose modification:   (original dose 254.4 mg, Cycle 1)  PACLitaxel (TAXOL) 114 mg in dextrose 5 % 250 mL chemo infusion ( ondansetron (ZOFRAN) 16 mg, dexamethasone (DECADRON) 20 mg in sodium chloride 0.9 % 50 mL IVPB, , Intravenous,  Once, 6 of 6 cycles  for chemotherapy treatment.     01/07/2019 -  Chemotherapy   The patient had palonosetron (ALOXI) injection 0.25 mg, 0.25 mg, Intravenous,  Once, 3 of 4 cycles Administration: 0.25 mg (01/07/2019), 0.25 mg (02/19/2019), 0.25 mg (02/01/2019) PEMEtrexed (ALIMTA) 700 mg in sodium chloride 0.9 % 100 mL chemo infusion, 500 mg/m2 = 700 mg, Intravenous,  Once, 3 of 4 cycles Administration: 700 mg (01/07/2019), 700 mg (02/19/2019), 700 mg (02/01/2019) CARBOplatin (PARAPLATIN) 300 mg in sodium chloride 0.9 % 250 mL chemo infusion, 300 mg (100 % of original dose 304.5 mg), Intravenous,  Once, 3 of 4 cycles Dose modification:   (original dose 304.5 mg, Cycle 1) Administration: 300 mg (01/07/2019), 300 mg (02/19/2019), 300 mg (02/01/2019) fosaprepitant (EMEND) 150 mg, dexamethasone (DECADRON) 12 mg in sodium chloride 0.9 % 145 mL IVPB, , Intravenous,  Once, 3 of 4 cycles Administration:  (01/07/2019),  (02/19/2019),  (02/01/2019)  for chemotherapy treatment.    Endometrial cancer ([place]  12/01/2007 Initial Diagnosis   She has recurrent papillary serous endometrial carcinoma. Briefly she was diagnosed in 2009 with a FIGO IB pappilary serous carcinoma treated with hysterectomy followed by adjuvant chemo and vaginal brachytherapy. She then had a recurrence diagnosed in late 2012 that was deemed not to be surgically resectable, and was treated with 6 cycles of carbo/taxol followed by 5040 cGy of EBRT to the pelvic mass.  She was then followed with serial imaging and was noted in June of this year to have slight increase in the size of the mass, and again in September there was small enlargement of the mass. She had a re-staging PET scan on 03/10/13 that showed FDG activity in the pelvic mass, with no other areas of disease   12/01/2007 Imaging   CT scan of chest, abdomen and pelvis: 1.  Mass like area of decreased attenuation in the central uterus, consistent with the known endometrial carcinoma.  Deep myometrial invasion is suspected.  Pelvic MRI without and with contrast could be performed for further staging workup if clinically warranted. 2.  No evidence of extrauterine extension or lymphadenopathy. 3.  Sigmoid diverticulosis incidentally noted.   12/15/2007 Surgery   --12/2007 laparoscopic hysterectomy and PLND --05/2008 complted 6 cycles adjuvant carbo/taxol followed by vaginal cuff brachy --03/2011 exploratory lararoscopy, ureterolysis --4/13 completed 6 cycels carbotaxol --8/13 completed EBRT to the left pelvic sidewall    06/10/2008 Imaging   CT scan abdomen and pelvis 1.  Nonspecific mildly prominent  inguinal lymph nodes, right greater than left. 2.  Interval hysterectomy without evidence of recurrent pelvic mass or fluid collection.   03/22/2011 Imaging   Ct scan abdomen and pelvis 1.  Local uterine cancer recurrence with two large nodes along the left pelvic sidewall. 2.  No evidence of bowel obstruction, urinary obstruction, or more distant metastasis.   03/31/2011 Relapse/Recurrence   chemotherapy and external beam radiation   05/16/2011 - 09/17/2011 Chemotherapy   She had 6 cycles of carboplatin and Taxol    07/17/2011 Imaging   Ct scan abdomen and pelvis 1.  Interval improvement in the previously demonstrated left pelvic local recurrence of tumor. 2.  No disease progression or complication identified. 3.  Mild bladder wall thickening on the right, nonspecific and possibly related to incomplete  distension and/or radiation therapy. 4.  Cholelithiasis   10/11/2011 Imaging   CT scan abdomen and pelvis 1.  Interval enlargement of the left pelvic sidewall masses. 2.  No new nodular disease in the pelvis or lymphadenopathy. 3.  No evidence  of distant metastasis. 4.  Mild hydronephrosis on the right is similar to prior. 5.  Focal thickening of the right aspect the bladder is stable compared to prior.    10/28/2011 - 12/05/2011 Radiation Therapy   radiation for pelvic sidewall recurrence   10/28/2011 - 12/05/2011 Radiation Therapy   10/28/11-12/05/11: Radiotherapy to the left pelvic sidewall   03/18/2012 Imaging   CT abdomen 1.  Today's study demonstrates a positive response to therapy with decreased size of left pelvic sidewall nodal masses, as detailed above.  No new soft tissue masses or new lymphadenopathy is identified within the abdomen or pelvis. Continue attention on follow-up studies is recommended. 2.  Cholelithiasis without findings to suggest acute cholecystitis. 3.  Colonic diverticulosis without findings to suggest acute diverticulitis at this time. 4.  Mild asymmetric urinary bladder wall thickening is unchanged compared to prior examinations and is nonspecific.  No definite bladder wall mass is identified at this time. 5.  Additional findings, similar to prior examinations, as above   06/22/2012 Imaging   CT abdomen 1.  Further decreased size of the left pelvic peripherally enhancing lesion. 2.  No new sites of new or progressive disease. 3. Esophageal air fluid level suggests dysmotility or gastroesophageal reflux. 4.  Cholelithiasis   11/02/2012 Imaging   CT abdomen 1.  The left pelvic side wall lesion demonstrates mild increase in size from previous exam. 2.  No new sites of new or progressive disease. 3.  Cholelithiasis.    01/28/2013 Imaging   CT abdomen 1. Interval small increase in volume of enhancing mass adjacent to the left pelvic sidewall. 2. No  evidence of abdominal or pelvic lymphadenopathy   04/02/2013 Surgery   pelvic mass resection with IORT   04/02/2013 - 04/02/2013 Radiation Therapy   04/02/13: Intraoperative radiotherapy to the left pelvis   05/10/2013 PET scan   1. Hypermetabolic mass in the deep left pelvis consistent with metastasis. 2. No additional evidence of local metastasis. No evidence of distant metastasis. 3. Small right lower lobe pulmonary nodule is not hypermetabolic.   07/27/2013 Imaging   No evidence of recurrent or metastatic disease. Apparent prior resection of the deep left pelvic metastasis.   02/08/2014 Imaging   No CT findings for recurrent or metastatic disease involving the abdomen/ pelvis   06/13/2014 Relapse/Recurrence   + recurrence paraspinous muscle   06/21/2014 Procedure   There is a soft tissue lesion along the left side of  L3-L4. This lesion roughly measures up to 2.2 cm. Needle was positioned along the posterior aspect of the lesion. Small amount of air in the paraspinal tissues following needle removal   06/21/2014 Pathology Results   Bone, biopsy, left lumbar paraspinal - METASTATIC POORLY DIFFERENTIATED CARCINOMA, CONSISTENT WITH HIGH GRADE SEROUS CARCINOMA SEE COMMENT. Microscopic Comment The carcinoma demonstrates the following immunophenotype: Cytokeratin 7 - patchy moderate to strong expression Estrogen receptor - negative expression P53 - strong diffuse expression TTF-1 - negative expression WT-1 - negative expression CD56 - focal moderate strong expression Synaptophysin - negative expression GCDFP - negative expression The history of primary endometrial papillary serous carcinoma and primary mammary carcinoma is noted. In the current case, the overall morphologic and immunophenotype are that of poorly differentiated carcinoma, consistent with high grade serous carcinoma.    07/11/2014 - 08/12/2014 Radiation Therapy   07/11/14-08/12/14: 55 Gy in 25 fractions to the Lumbar spine    08/15/2014 Imaging   CT scan of chest, abdomen and pelvis 1. Since the biopsy study of 06/21/2014, similar to slight decrease in size of a left paraspinous lesion at L3-4. 2. No new sites of metastatic disease identified. 3. 8 mm ground-glass nodule in the right lower lobe is grossly similar to 03/10/2013, suggesting a benign etiology. 4. Cholelithiasis. 5. Apparent sigmoid colonic wall thickening which could be due to underdistention. Colitis felt less likely. 6.  Atherosclerosis, including within the coronary arteries. 7. Pelvic floor laxity.   10/07/2014 Imaging   CT scan of chest, abdomen and pelvis: Stable to slight interval decrease in size of left paraspinous lesion at L3-4. No new sites of metastatic disease. Unchanged ground-glass nodule within the right lower lobe, potentially benign in etiology. Recommend attention on followup. Cholelithiasis. Sigmoid colonic diverticulosis. No CT evidence to suggest acute diverticulitis.   01/19/2015 Imaging   Ct scan of abdomen and pelvis 1. Decrease in size of left paraspinous metastasis. 2. No new sites of disease. 3. Stable ground-glass attenuating nodule within the superior segment of right lower lobe. 4. Gallstones   05/02/2015 Imaging   Ct abdomen and pelvis: Slight interval increase in size of left lumbar paraspinal soft tissue metastasis. No new sites of metastatic disease identified within the abdomen or pelvis.    05/11/2015 PET scan   1. The small soft tissue mass just lateral to the left L3 for neural foramen is hypermetabolic, favoring malignancy. 2. There is also a small focus of hypermetabolic activity between the left L1 and L2 transverse processes, but without a CT correlate. 3. The 13 mm sub solid nodule in the superior segment right lower lobe is stable from recent exams but has slowly increased in size over the last 7 years. Although not hypermetabolic, the appearance is concerning for the possibility of a low grade  adenocarcinoma. 4. Other imaging findings of potential clinical significance: Chronic bilateral maxillary sinusitis. Coronary, aortic arch, and branch vessel atherosclerotic vascular disease. Aortoiliac atherosclerotic vascular disease. Cholelithiasis. Sigmoid colon diverticulosis. Lumbar scoliosis.   07/11/2015 Imaging   Ct scan of chest, abdomen and pelvis: 1. 13 mm mixed solid and sub solid nodule in the posterior right lower lobe was present in 2009 and has clearly progressed in the interval since that study. Imaging features remain highly concerning for low-grade or well differentiated adenocarcinoma. 2. Slight increase size of in the hypermetabolic, rim enhancing nodule identified adjacent to the left L3-4 neural foramen. Metastatic disease remains a concern. 3. No evidence for discrete soft tissue lesion between the left L1 and  2 transverse processes, the site of focal FDG uptake on the recent PET-CT. 4. Cholelithiasis. 5. Abdominal aortic atherosclerosis   08/03/2015 - 01/16/2016 Chemotherapy   She received carboplatin and Taxol   11/06/2015 PET scan   1. Hypermetabolic left F7-9 paraspinous metastasis (biopsy-proven) is stable in size and slightly decreased in metabolism. 2. Previously described small focus of hypermetabolism between the left L2 and L3 spinous processes has resolved. 3. New mild linear hypermetabolism to the left of the T11-12 spinous processes without discrete mass on the CT images, favor benign activity related activity, recommend attention on follow-up PET-CT.  4. No definite new sites of hypermetabolic metastatic disease. No recurrent hypermetabolic metastatic disease in the pelvis. 5. Interval stability of subsolid 1.3 cm superior segment right lower lobe pulmonary nodule without associated significant metabolism, which has grown compared to the 2009 chest CT study, and remain suspicious for low grade adenocarcinoma. 6. Additional findings include aortic atherosclerosis,  coronary atherosclerosis, mild multinodular goiter with no hypermetabolic thyroid nodules, cholelithiasis and moderate sigmoid diverticulosis.   02/12/2016 PET scan   1. Interval increase in size and metabolic activity of the LEFT paraspinal soft tissue metastasis. 2. New activity within the musculature of the LEFT chest wall. Given the unusual location of the paraspinal metastasis cannot exclude a second soft tissue metastasis to the musculature however favor benign physiologic activity. 3. Stable RIGHT lower lobe pulmonary nodule. 4. No evidence of local recurrence.     04/08/2016 Imaging   Ct scan of chest, abdomen and pelvis: 1. Similar size of a  left paravertebral abdominal lesion. 2. No new or progressive metastatic disease. 3. No correlate for the muscular activity about the lateral left chest wall. 4.  Coronary artery atherosclerosis. Aortic atherosclerosis. 5. Cholelithiasis. 6. Similar right lower lobe pulmonary nodule.   06/03/2016 Imaging   CT abdomen and pelvis 1. Similar to mild enlargement of a paravertebral soft tissue lesion at the L3-4 level. 2. No new sites of disease identified. 3. Cholelithiasis. 4. Hysterectomy. 5.  Aortic atherosclerosis.    09/02/2016 Imaging   CT abdomen and pelvis 1. Continue mild interval increase in size of LEFT paraspinal mass (1-2 mm). 2. No evidence of new metastatic disease in the abdomen pelvis. 3. Post hysterectomy anatomy   09/26/2016 Imaging   MR lumbar spine 1. Left paraspinal metastasis with epicenter adjacent to the left L3 neural foramen does extend into the foramen to the level of the left lateral epidural space (series 9, image 31), but also tracks cephalad and caudal along the L2-L4 lumbar plexus (series 10, image 15). No extension into the left L2 or L4 neural foramina. And no more distal extension of tumor. 2. Abnormal signal in the medial left psoas and ventral left erector spinae muscles at L2 and L3 is probably  denervation related. 3. No bone invasion or osseous metastatic disease. Suspect previous radiation of the L2 through L5 spinal levels. 4. No dural or intradural metastatic disease.    10/22/2016 Procedure   She underwent stereotactic Radiosurgery   10/30/2016 Genetic Testing   Patient has genetic testing done for Inheritable genetic mutation panel. Results revealed patient has no actionable mutation.   01/21/2017 Imaging   1. Reduced size and conspicuity of the left paraspinal mass at the L3-4 level. The mass still present but has enhancement similar to that of adjacent psoas musculature. 2. No new metastatic disease is identified. 3. Other imaging findings of potential clinical significance: Cholelithiasis. Aortic Atherosclerosis (ICD10-I70.0). Sigmoid diverticulosis. Lumbar scoliosis.  05/09/2017 Imaging   Ct scan of abdomen and pelvis 1. Paraspinal mass adjacent to the left side of L3-L4 is less distinct than prior examinations, and is therefore difficult to discretely measure, however, the overall appearance suggests continued positive response to therapy. 2. No new signs of metastatic disease elsewhere in the abdomen or pelvis. 3. Aortic atherosclerosis, in addition to at least right coronary artery disease. Assessment for potential risk factor modification, dietary therapy or pharmacologic therapy may be warranted, if clinically indicated. 4. Colonic diverticulosis without evidence of acute diverticulitis at this time. 5. Additional incidental findings, as above. Aortic Atherosclerosis (ICD10-I70.0).   08/14/2017 Imaging   1. Treated left paraspinal mass centered at L3-4. Mass size is stable but there has been some evolution of tumor characteristics with less central fluid seen today. Recommend continued surveillance. 2. No evidence of untreated metastasis. 3. Degenerative changes and related impingement are described above.   10/28/2017 Imaging   Stable small left paraspinal soft  tissue mass. No new or progressive disease within the abdomen or pelvis.  Cholelithiasis.  No radiographic evidence of cholecystitis.  Colonic diverticulosis, without radiographic evidence of diverticulitis.   04/30/2018 Imaging   Stable post treatment change of the partially necrotic LEFT paravertebral mass at L3-L4. 18 x 21 mm cross-section. Mass effect on the LEFT L3 nerve root redemonstrated. Enhancing and atrophic LEFT psoas is inseparable.   10/15/2018 Imaging   1. Stable post treatment size and appearance of partially necrotic left paravertebral mass at L3-4, measuring 19 x 25 mm on current exam (unchanged when measured at similar level on previous study). Mass effect on the adjacent left L3 nerve root is unchanged. Adjacent enhancing and atrophic left psoas muscle. 2. Left convex scoliosis with associated multilevel degenerative spondylolysis and facet arthrosis, unchanged   10/29/2018 Imaging   Stable small left L3 paraspinal soft tissue mass. No evidence of new or progressive metastatic disease, or other acute findings.   Cholelithiasis.  No radiographic evidence of cholecystitis.   Colonic diverticulosis, without radiographic evidence of diverticulitis.   11/11/2018 PET scan   1. Low level metabolism (max SUV 2.0) associated with the irregular subsolid superior segment right lower lobe 2.1 cm pulmonary nodule. As better depicted on the recent diagnostic chest CT study, this nodule crosses the major fissure into the right upper lobe and has increased in size and density on multiple imaging studies back to 2009. Findings are most suggestive of primary bronchogenic adenocarcinoma. 2. No hypermetabolic thoracic adenopathy. 3. Persistent hypermetabolism (max SUV 8.0) associated with the known left L3-4 paraspinous metastasis, mildly decreased in metabolism since 02/12/2016 PET-CT. 4. No additional sites of hypermetabolic metastatic disease in the abdomen or pelvis. No metabolic evidence  of peritoneal recurrence. No ascites. 5. Chronic findings include: Aortic Atherosclerosis (ICD10-I70.0). Coronary atherosclerosis. Chronic bilateral maxillary sinusitis. Cholelithiasis. Moderate sigmoid diverticulosis.   01/07/2019 -  Chemotherapy   The patient had palonosetron (ALOXI) injection 0.25 mg, 0.25 mg, Intravenous,  Once, 3 of 4 cycles Administration: 0.25 mg (01/07/2019), 0.25 mg (02/19/2019), 0.25 mg (02/01/2019) PEMEtrexed (ALIMTA) 700 mg in sodium chloride 0.9 % 100 mL chemo infusion, 500 mg/m2 = 700 mg, Intravenous,  Once, 3 of 4 cycles Administration: 700 mg (01/07/2019), 700 mg (02/19/2019), 700 mg (02/01/2019) CARBOplatin (PARAPLATIN) 300 mg in sodium chloride 0.9 % 250 mL chemo infusion, 300 mg (100 % of original dose 304.5 mg), Intravenous,  Once, 3 of 4 cycles Dose modification:   (original dose 304.5 mg, Cycle 1) Administration: 300 mg (01/07/2019), 300 mg (  02/19/2019), 300 mg (02/01/2019) fosaprepitant (EMEND) 150 mg, dexamethasone (DECADRON) 12 mg in sodium chloride 0.9 % 145 mL IVPB, , Intravenous,  Once, 3 of 4 cycles Administration:  (01/07/2019),  (02/19/2019),  (02/01/2019)  for chemotherapy treatment.    Metastatic cancer to bone ([place])  06/27/2014 Initial Diagnosis   Metastatic cancer to bone   Primary lung cancer, right ([place])  11/04/2018 Initial Diagnosis   Primary lung cancer, right ([place])   11/11/2018 PET scan   1. Low level metabolism (max SUV 2.0) associated with the irregular subsolid superior segment right lower lobe 2.1 cm pulmonary nodule. As better depicted on the recent diagnostic chest CT study, this nodule crosses the major fissure into the right upper lobe and has increased in size and density on multiple imaging studies back to 2009. Findings are most suggestive of primary bronchogenic adenocarcinoma. 2. No hypermetabolic thoracic adenopathy. 3. Persistent hypermetabolism (max SUV 8.0) associated with the known left L3-4 paraspinous metastasis, mildly decreased  in metabolism since 02/12/2016 PET-CT. 4. No additional sites of hypermetabolic metastatic disease in the abdomen or pelvis. No metabolic evidence of peritoneal recurrence. No ascites. 5. Chronic findings include: Aortic Atherosclerosis (ICD10-I70.0). Coronary atherosclerosis. Chronic bilateral maxillary sinusitis. Cholelithiasis. Moderate sigmoid diverticulosis.   12/07/2018 Pathology Results   1. Lung, resection (segmental or lobe), Right Lobe Superior with endblock wedge of right upper lobe - INVASIVE ADENOCARCINOMA, MODERATELY DIFFERENTIATED, SPANNING 1.6 CM. - THE SURGICAL RESECTION MARGINS ARE NEGATIVE FOR CARCINOMA. - SEE ONCOLOGY TABLE BELOW. 2. Lymph node, biopsy, Level 9 #1 - THERE IS NO EVIDENCE OF CARCINOMA IN 1 OF 1 LYMPH NODE (0/1). 3. Lymph node, biopsy, Level 9 #2 - THERE IS NO EVIDENCE OF CARCINOMA IN 1 OF 1 LYMPH NODE (0/1). 4. Lymph node, biopsy, Level 7 #1 - METASTATIC CARCINOMA IN 1 OF 1 LYMPH NODE (1/1). 5. Lymph node, biopsy, Level 7 #2 - THERE IS NO EVIDENCE OF CARCINOMA IN 1 OF 1 LYMPH NODE (0/1). 6. Lymph node, biopsy, Level 12 #1 - THERE IS NO EVIDENCE OF CARCINOMA IN 1 OF 1 LYMPH NODE (0/1). 7. Lymph node, biopsy, Level 12 #2 - THERE IS NO EVIDENCE OF CARCINOMA IN 1 OF 1 LYMPH NODE (0/1). 8. Lymph node, biopsy, Level 10 #1 - THERE IS NO EVIDENCE OF CARCINOMA IN 1 OF 1 LYMPH NODE (0/1). 9. Lymph node, biopsy, Level 4R #1 - THERE IS NO EVIDENCE OF CARCINOMA IN 1 OF 1 LYMPH NODE (0/1). 10. Lymph node, biopsy, Level 4R #2 - THERE IS NO EVIDENCE OF CARCINOMA IN 1 OF 1 LYMPH NODE (0/1). 11. Lymph node, biopsy, Level 2R #1 - THERE IS NO EVIDENCE OF CARCINOMA IN 1 OF 1 LYMPH NODE (0/1). 12. Lung, resection (segmental or lobe), Right Lobe Superior - BENIGN LUNG PARENCHYMA. - THERE IS NO EVIDENCE OF MALIGNANCY. Procedure: Segmentectomy. Specimen Laterality: Right. Tumor Site: Right superior lobe. Tumor Size: 1.6 cm (gross measurement). Tumor Focality:  Unifocal. Histologic Type: Adenocarcinoma, moderately differentiated. Visceral Pleura Invasion: Not identified. Lymphovascular Invasion: Not identified. Direct Invasion of Adjacent Structures: Not identified. Margins: Negative for carcinoma. Treatment Effect: N/A Regional Lymph Nodes: Number of Lymph Nodes Involved: 1 (level 7) Number of Lymph Nodes Examined: 10 Pathologic Stage Classification (pTNM, AJCC 8th Edition): pT1b, pN2 Ancillary Studies: The tumor cells are positive for Napsin-A, TTF-1, and p53. They are essentially negative for estrogen receptor, PAX-8 and progesterone receptor. Additional studies can be performed upon clinician request. Representative Tumor Block: 1D-1E. (JBK:gt, 12/09/18)   12/07/2018 Surgery   PREOPERATIVE  DIAGNOSIS:  Right lung nodule.   POSTOPERATIVE DIAGNOSIS:  Non-small cell carcinoma- suspected primary lung carcinoma, clinical stage T2N01b.   PROCEDURE:   Right video-assisted thoracoscopy, Right lower lobe superior segmentectomy with en bloc wedge resection of right upper lobe, Lymph node dissection, Intercostal nerve block levels 3-9.   SURGEON:  Modesto Charon, MD   FINDINGS:  Mass palpable in posterior aspect of superior segment of right lower lobe, likely involvement across fissure into the upper lobe.  Frozen section revealed non-small cell carcinoma. upper and lower lobe margins clear.   12/23/2018 Cancer Staging   Staging form: Lung, AJCC 8th Edition - Pathologic: Stage IIIA (pT1b, pN2, cM0) - Signed by Heath Lark, MD on 12/23/2018   01/05/2019 Imaging   MRI brain 1. No metastatic disease or acute intracranial abnormality identified. 2. Small chronic parafalcine meningioma size is stable since that described in 2006 (12 x 15 mm). 3. Advanced signal changes in the cerebral white matter and to a lesser extent pons, nonspecific but most commonly due to chronic small vessel disease. 4. Partially visible degenerative cervical spinal  stenosis.     01/07/2019 -  Chemotherapy   The patient had carboplatin and Alimta for chemotherapy treatment.       REVIEW OF SYSTEMS:   Constitutional: Denies fevers, chills or abnormal weight loss Eyes: Denies blurriness of vision Ears, nose, mouth, throat, and face: Denies mucositis or sore throat Respiratory: Denies cough, dyspnea or wheezes Cardiovascular: Denies palpitation, chest discomfort or lower extremity swelling Gastrointestinal:  Denies nausea, heartburn or change in bowel habits Skin: Denies abnormal skin rashes Lymphatics: Denies new lymphadenopathy or easy bruising Neurological:Denies numbness, tingling or new weaknesses Behavioral/Psych: Mood is stable, no new changes  All other systems were reviewed with the patient and are negative.  I have reviewed the past medical history, past surgical history, social history and family history with the patient and they are unchanged from previous note.  ALLERGIES:  is allergic to ace inhibitors; codeine; demerol  [meperidine hcl]; and dilaudid [hydromorphone hcl].  MEDICATIONS:  Current Outpatient Medications  Medication Sig Dispense Refill  . amLODipine (NORVASC) 5 MG tablet Take 5 mg by mouth daily.    Marland Kitchen atenolol (TENORMIN) 25 MG tablet Take 25 mg by mouth 2 (two) times daily.     . Biotin 5000 MCG TABS Take 5,000 mcg by mouth daily.    . folic acid (FOLVITE) 1 MG tablet Take 1 tablet (1 mg total) by mouth daily. Start 5-7 days before Alimta chemotherapy. Continue until 21 days after Alimta completed. 100 tablet 3  . gabapentin (NEURONTIN) 100 MG capsule TAKE 2 3 CAPSULES AT NIGHT    . lidocaine-prilocaine (EMLA) cream Apply to [facility] cath 1-2 hours prior to access as directed 30 g 1  . LORazepam (ATIVAN) 0.5 MG tablet 1 tablet po 30 minutes prior to radiation or MRI 30 tablet 0  . Magnesium 250 MG TABS Take 250 mg by mouth daily.     . Multiple Vitamin (MULTIVITAMIN WITH MINERALS) TABS tablet Take 1 tablet by mouth daily.     . ondansetron (ZOFRAN) 8 MG tablet Take 1 tablet (8 mg total) by mouth every 8 (eight) hours as needed (Nausea or vomiting). Start if needed on the third day after chemotherapy. 30 tablet 1  . potassium gluconate 595 MG TABS tablet Take 595 mg by mouth daily.     . prochlorperazine (COMPAZINE) 10 MG tablet Take 1 tablet (10 mg total) by mouth every 6 (six) hours as  needed (Nausea or vomiting). 30 tablet 1  . simvastatin (ZOCOR) 20 MG tablet Take 20 mg by mouth every evening.     . traMADol (ULTRAM) 50 MG tablet Take 1 tablet (50 mg total) by mouth every 6 (six) hours as needed (mild pain). 30 tablet 0  . TURMERIC PO Take 1 capsule by mouth daily.     No current facility-administered medications for this visit.    Facility-Administered Medications Ordered in Other Visits  Medication Dose Route Frequency Provider Last Rate Last Dose  . heparin lock flush 100 unit/mL  500 Units Intracatheter Once Marticia Reifschneider, MD      . sodium chloride flush (NS) 0.9 % injection 10 mL  10 mL Intracatheter Once Alvy Bimler, Kiwan Gadsden, MD        PHYSICAL EXAMINATION: ECOG PERFORMANCE STATUS: 1 - Symptomatic but completely ambulatory  Vitals:   03/15/19 0902  BP: (!) 172/79  Pulse: 72  Resp: 18  Temp: 97.8 F (36.6 C)  SpO2: 100%   Filed Weights   03/15/19 0902  Weight: 113 lb 4.8 oz (51.4 kg)    GENERAL:alert, no distress and comfortable SKIN: skin color, texture, turgor are normal, no rashes or significant lesions EYES: normal, Conjunctiva are pink and non-injected, sclera clear OROPHARYNX:no exudate, no erythema and lips, buccal mucosa, and tongue normal  NECK: supple, thyroid normal size, non-tender, without nodularity LYMPH:  no palpable lymphadenopathy in the cervical, axillary or inguinal LUNGS: clear to auscultation and percussion with normal breathing effort HEART: regular rate & rhythm and no murmurs and no lower extremity edema ABDOMEN:abdomen soft, non-tender and normal bowel  sounds Musculoskeletal:no cyanosis of digits and no clubbing  NEURO: alert & oriented x 3 with fluent speech, no focal motor/sensory deficits  LABORATORY DATA:  I have reviewed the data as listed    Component Value Date/Time   NA 141 02/26/2019 0935   NA 140 05/09/2017 0844   K 4.1 02/26/2019 0935   K 4.1 05/09/2017 0844   CL 106 02/26/2019 0935   CO2 25 02/26/2019 0935   CO2 26 05/09/2017 0844   GLUCOSE 87 02/26/2019 0935   GLUCOSE 82 05/09/2017 0844   BUN 21 02/26/2019 0935   BUN 20.7 05/09/2017 0844   CREATININE 0.80 02/26/2019 0935   CREATININE 0.7 05/09/2017 0844   CALCIUM 9.1 02/26/2019 0935   CALCIUM 9.3 05/09/2017 0844   PROT 6.6 02/26/2019 0935   PROT 6.8 05/09/2017 0844   ALBUMIN 3.5 02/26/2019 0935   ALBUMIN 3.6 05/09/2017 0844   AST 30 02/26/2019 0935   AST 27 05/09/2017 0844   ALT 22 02/26/2019 0935   ALT 17 05/09/2017 0844   ALKPHOS 52 02/26/2019 0935   ALKPHOS 43 05/09/2017 0844   BILITOT 0.7 02/26/2019 0935   BILITOT 0.81 05/09/2017 0844   GFRNONAA >60 02/26/2019 0935   GFRAA >60 02/26/2019 0935    No results found for: SPEP, UPEP  Lab Results  Component Value Date   WBC 5.1 03/15/2019   NEUTROABS 2.9 03/15/2019   HGB 9.7 (L) 03/15/2019   HCT 29.6 (L) 03/15/2019   MCV 96.1 03/15/2019   PLT 174 03/15/2019      Chemistry      Component Value Date/Time   NA 141 02/26/2019 0935   NA 140 05/09/2017 0844   K 4.1 02/26/2019 0935   K 4.1 05/09/2017 0844   CL 106 02/26/2019 0935   CO2 25 02/26/2019 0935   CO2 26 05/09/2017 0844   BUN 21 02/26/2019 0935  BUN 20.7 05/09/2017 0844   CREATININE 0.80 02/26/2019 0935   CREATININE 0.7 05/09/2017 0844      Component Value Date/Time   CALCIUM 9.1 02/26/2019 0935   CALCIUM 9.3 05/09/2017 0844   ALKPHOS 52 02/26/2019 0935   ALKPHOS 43 05/09/2017 0844   AST 30 02/26/2019 0935   AST 27 05/09/2017 0844   ALT 22 02/26/2019 0935   ALT 17 05/09/2017 0844   BILITOT 0.7 02/26/2019 0935   BILITOT 0.81  05/09/2017 0844       RADIOGRAPHIC STUDIES: I have personally reviewed the radiological images as listed and agreed with the findings in the report. Dg Chest 2 View  Result Date: 02/26/2019 CLINICAL DATA:  Shortness of breath, gradual worsening. EXAM: CHEST - 2 VIEW COMPARISON:  02/09/2019 FINDINGS: Left-sided Port-A-Cath remains in place, tip in the mid SVC. Lungs are clear. No signs of effusion or consolidation. Changes of right axillary dissection are present. Spinal degenerative changes with moderate to marked curvature, levoconvexity centered about the thoracolumbar junction similar to prior study. No signs of acute bone finding. IMPRESSION: 1. No active cardiopulmonary disease. Electronically Signed   By: Zetta Bills M.D.   On: 02/26/2019 09:17    All questions were answered. The patient knows to call the clinic with any problems, questions or concerns. No barriers to learning was detected.  I spent 15 minutes counseling the patient face to face. The total time spent in the appointment was 20 minutes and more than 50% was on counseling and review of test results  Heath Lark, MD 03/15/2019 9:21 AM

## 2019-03-15 NOTE — Assessment & Plan Note (Signed)
She has intermittent pancytopenia due to treatment She is mildly symptomatic from recent anemia She is taking oral iron supplement

## 2019-03-15 NOTE — Assessment & Plan Note (Signed)
Her blood pressure is significantly elevated today due to anxiety She is not symptomatic Observe only for now

## 2019-03-15 NOTE — Patient Instructions (Signed)

## 2019-03-15 NOTE — Assessment & Plan Note (Signed)
She will complete cycle 4 of treatment today I plan to repeat PET CT scan at the end of the month She has appointment next month to discuss adjuvant radiation therapy with radiation oncologist I plan to see her back in January for further follow-up

## 2019-03-23 ENCOUNTER — Other Ambulatory Visit: Payer: Self-pay

## 2019-03-23 ENCOUNTER — Ambulatory Visit (HOSPITAL_COMMUNITY)
Admission: RE | Admit: 2019-03-23 | Discharge: 2019-03-23 | Disposition: A | Discharge status: Home / Self Care | Payer: Medicare Other | Source: Ambulatory Visit | Attending: Medical | Admitting: Medical

## 2019-03-23 ENCOUNTER — Other Ambulatory Visit: Payer: Self-pay | Admitting: Emergency Medicine

## 2019-03-23 ENCOUNTER — Inpatient Hospital Stay: Payer: Medicare Other

## 2019-03-23 ENCOUNTER — Inpatient Hospital Stay (HOSPITAL_BASED_OUTPATIENT_CLINIC_OR_DEPARTMENT_OTHER): Payer: Medicare Other | Admitting: Medical

## 2019-03-23 ENCOUNTER — Telehealth: Payer: Self-pay | Admitting: *Deleted

## 2019-03-23 ENCOUNTER — Encounter: Payer: Self-pay | Admitting: Medical

## 2019-03-23 VITALS — BP 151/78 | HR 80 | Temp 98.5°F | Resp 18 | Ht 59.0 in | Wt 113.1 lb

## 2019-03-23 DIAGNOSIS — R0600 Dyspnea, unspecified: Secondary | ICD-10-CM

## 2019-03-23 DIAGNOSIS — C775 Secondary and unspecified malignant neoplasm of intrapelvic lymph nodes: Secondary | ICD-10-CM

## 2019-03-23 DIAGNOSIS — D701 Agranulocytosis secondary to cancer chemotherapy: Secondary | ICD-10-CM

## 2019-03-23 DIAGNOSIS — R0609 Other forms of dyspnea: Secondary | ICD-10-CM

## 2019-03-23 DIAGNOSIS — C3491 Malignant neoplasm of unspecified part of right bronchus or lung: Secondary | ICD-10-CM | POA: Diagnosis not present

## 2019-03-23 DIAGNOSIS — Z853 Personal history of malignant neoplasm of breast: Secondary | ICD-10-CM | POA: Diagnosis not present

## 2019-03-23 DIAGNOSIS — Z95828 Presence of other vascular implants and grafts: Secondary | ICD-10-CM

## 2019-03-23 DIAGNOSIS — C541 Malignant neoplasm of endometrium: Secondary | ICD-10-CM

## 2019-03-23 DIAGNOSIS — C7951 Secondary malignant neoplasm of bone: Secondary | ICD-10-CM | POA: Diagnosis not present

## 2019-03-23 DIAGNOSIS — Z5111 Encounter for antineoplastic chemotherapy: Secondary | ICD-10-CM | POA: Diagnosis not present

## 2019-03-23 DIAGNOSIS — C3431 Malignant neoplasm of lower lobe, right bronchus or lung: Secondary | ICD-10-CM | POA: Diagnosis not present

## 2019-03-23 DIAGNOSIS — T451X5A Adverse effect of antineoplastic and immunosuppressive drugs, initial encounter: Secondary | ICD-10-CM | POA: Diagnosis not present

## 2019-03-23 DIAGNOSIS — C549 Malignant neoplasm of corpus uteri, unspecified: Secondary | ICD-10-CM | POA: Diagnosis not present

## 2019-03-23 DIAGNOSIS — R0602 Shortness of breath: Secondary | ICD-10-CM | POA: Diagnosis not present

## 2019-03-23 LAB — CMP (CANCER CENTER ONLY)
ALT: 17 U/L (ref 0–44)
AST: 27 U/L (ref 15–41)
Albumin: 3.5 g/dL (ref 3.5–5.0)
Alkaline Phosphatase: 52 U/L (ref 38–126)
Anion gap: 9 (ref 5–15)
BUN: 20 mg/dL (ref 8–23)
CO2: 25 mmol/L (ref 22–32)
Calcium: 8.7 mg/dL — ABNORMAL LOW (ref 8.9–10.3)
Chloride: 106 mmol/L (ref 98–111)
Creatinine: 0.85 mg/dL (ref 0.44–1.00)
GFR, Est AFR Am: >60 mL/min (ref >60)
GFR, Estimated: >60 mL/min (ref >60)
Glucose, Bld: 114 mg/dL — ABNORMAL HIGH (ref 70–99)
Potassium: 4.2 mmol/L (ref 3.5–5.1)
Sodium: 140 mmol/L (ref 135–145)
Total Bilirubin: 0.3 mg/dL (ref 0.3–1.2)
Total Protein: 6.4 g/dL — ABNORMAL LOW (ref 6.5–8.1)

## 2019-03-23 LAB — CBC WITH DIFFERENTIAL (CANCER CENTER ONLY)
Abs Immature Granulocytes: 0.01 10*3/uL (ref 0.00–0.07)
Basophils Absolute: 0 10*3/uL (ref 0.0–0.1)
Basophils Relative: 0 %
Eosinophils Absolute: 0 10*3/uL (ref 0.0–0.5)
Eosinophils Relative: 0 %
HCT: 25.5 % — ABNORMAL LOW (ref 36.0–46.0)
Hemoglobin: 8.4 g/dL — ABNORMAL LOW (ref 12.0–15.0)
Immature Granulocytes: 0 %
Lymphocytes Relative: 28 %
Lymphs Abs: 0.6 10*3/uL — ABNORMAL LOW (ref 0.7–4.0)
MCH: 31.9 pg (ref 26.0–34.0)
MCHC: 32.9 g/dL (ref 30.0–36.0)
MCV: 97 fL (ref 80.0–100.0)
Monocytes Absolute: 0.5 10*3/uL (ref 0.1–1.0)
Monocytes Relative: 24 %
Neutro Abs: 1.1 10*3/uL — ABNORMAL LOW (ref 1.7–7.7)
Neutrophils Relative %: 48 %
Platelet Count: 94 10*3/uL — ABNORMAL LOW (ref 150–400)
RBC: 2.63 MIL/uL — ABNORMAL LOW (ref 3.87–5.11)
RDW: 17.8 % — ABNORMAL HIGH (ref 11.5–15.5)
WBC Count: 2.2 10*3/uL — ABNORMAL LOW (ref 4.0–10.5)
nRBC: 0 % (ref 0.0–0.2)

## 2019-03-23 LAB — SAMPLE TO BLOOD BANK

## 2019-03-23 MED ORDER — SODIUM CHLORIDE 0.9% FLUSH
10.0000 mL | Freq: Once | INTRAVENOUS | Status: AC
  Administered 2019-03-23: 13:00:00 10 mL
  Filled 2019-03-23: qty 10

## 2019-03-23 MED ORDER — HEPARIN SOD (PORK) LOCK FLUSH 100 UNIT/ML IV SOLN
500.0000 [IU] | Freq: Once | INTRAVENOUS | Status: AC
  Administered 2019-03-23: 13:00:00 500 [IU]
  Filled 2019-03-23: qty 5

## 2019-03-23 NOTE — Progress Notes (Signed)
These results were called to [name] and were reviewed with her . Her questions were answered. She expressed understanding.

## 2019-03-23 NOTE — Patient Instructions (Signed)
Chest X-Ray A chest X-ray is a painless test that uses radiation to create images of the structures inside of your chest. Chest X-rays are used to look for many health conditions, including heart failure, pneumonia, tuberculosis, rib fractures, breathing disorders, and cancer. They may be used to diagnose chest pain, constant coughing, or trouble breathing. Tell a health care provider about:  Any allergies you have.  All medicines you are taking, including vitamins, herbs, eye drops, creams, and over-the-counter medicines.  Any surgeries you have had.  Any medical conditions you have.  Whether you are pregnant or may be pregnant. What are the risks? Getting a chest X-ray is a safe procedure. However, you will be exposed to a small amount of radiation. Being exposed to too much radiation over a lifetime can increase the risk of cancer. This risk is small, but it may occur if you have many X-rays throughout your life. What happens before the procedure?  You may be asked to remove glasses, jewelry, and any other metal objects.  You will be asked to undress from the waist up. You may be given a hospital gown to wear.  You may be asked to wear a protective lead apron to protect parts of your body from radiation. What happens during the procedure?   You will be asked to stand still as each picture is taken to get the best possible images.  You will be asked to take a deep breath and hold your breath for a few seconds.  The X-ray machine will create a picture of your chest using a tiny burst of radiation. This is painless.  More pictures may be taken from other angles. Typically, one picture will be taken while you face the X-ray camera, and another picture will be taken from the side while you stand. If you cannot stand, you may be asked to lie down. The procedure may vary among health care providers and hospitals. What happens after the procedure?  The X-ray(s) will be reviewed by your  health care provider or an X-ray (radiology) specialist.  It is up to you to get your test results. Ask your health care provider, or the department that is doing the test, when your results will be ready.  Your health care provider will tell you if you need more tests or a follow-up exam. Keep all follow-up visits as told by your health care provider. This is important. Summary  A chest X-ray is a safe, painless test that is used to examine the inside of the chest, heart, and lungs.  You will need to undress from the waist up and remove jewelry and metal objects before the procedure.  You will be exposed to a small amount of radiation during the procedure.  The X-ray machine will take one or more pictures of your chest while you remain as still as possible.  Later, a health care provider or specialist will review the test results with you. This information is not intended to replace advice given to you by your health care provider. Make sure you discuss any questions you have with your health care provider. Document Released: 06/25/2016 Document Revised: 08/19/2018 Document Reviewed: 06/25/2016 Elsevier Patient Education  2020 [employer]  

## 2019-03-23 NOTE — Telephone Encounter (Signed)
pls call symptom management to see if she can be seen

## 2019-03-23 NOTE — Telephone Encounter (Signed)
Telephone call to patient- she would like to come in today asap. Per [facility] send her right in. Notified [facility] patient is on the way.

## 2019-03-23 NOTE — Progress Notes (Signed)
[facility] Progress Note   [name] [id number] 03/27/1941 78 y.o.  [name] is managed by is managed by Dr. Heath Lark  Actively treated with chemotherapy/immunotherapy/hormonal therapy: yes  Current therapy: Carboplatin and Alimta  Last treated: 03/15/2019 (cycle 4, day 1)  Next scheduled appointment with provider: 06/07/2019 with a repeat PET CT scan prior to her return.  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 Heath Lark and is status post cycle 4, day 1 of carboplatin and Alimta which was dosed on 03/15/2019.  She is scheduled to return for follow-up on 06/07/2019 with a repeat CT scan completed prior to her return.  Dyspnea on exertion: The patient presents to clinic today after she called stating that she was weaker, dizzy and had lightheadedness and trembling.  She also has increased shortness of breath.  [name] was noted to desaturate briefly when she ambulated and was talking.  Her oxygen saturation recovered immediately after she stopped talking and continue to ambulate.  She was given a trial of supplemental oxygen but stated that it did not make her dizziness any better.  She defers having home oxygen at this time.  We will continue to monitor her.  She was told to return or call immediately should she note decompensation of her breathing.  She was referred for a chest x-ray which returned showing:  The heart size and mediastinal contours are within normal limits. No pneumothorax or pleural effusion is noted. Surgical clip seen in right axillary region. Scarring and postoperative changes noted in the right midlung. No acute abnormality is noted. The  visualized skeletal structures are unremarkable.  Please see After Visit Summary for patient specific instructions.  Future Appointments  Date Time Provider [place]  03/23/2019 11:30 AM Harle Stanford., PA-C [facility] None  04/01/2019 12:00 PM [facility]MR 1 [facility] [place]  04/13/2019 10:30 AM Hayden Pedro, PA-C [facility] None  04/14/2019 10:00 AM [facility]-NM PET CT 1 [facility]NM [place]  06/07/2019  9:00 AM [facility] LAB 4 [facility] None  06/07/2019  9:15 AM [facility] [place] FLUSH [facility] None  06/07/2019  9:45 AM Gorsuch, Ni, MD [facility] None    No orders of the defined types were placed in this encounter.      Subjective:   Patient ID:  [name] is a 78 y.o. (DOB 1941-04-24) female.  Chief Complaint: No chief complaint on file.   HPI [name]  Is a 78 y.o. female with a diagnosis of a primary lung cancer with a history of a metastatic endometrial cancer. She continues to be followed by Dr. Heath Lark and is status post cycle 4, day 1 of carboplatin and Alimta which was dosed on 03/15/2019.  She is scheduled to return for follow-up on 06/07/2019 with a repeat CT scan completed prior to her return. [name] called our office earlier today stating that she was weaker, dizzy and had lightheadedness and trembling.  She also has increased shortness of breath.  She denies nausea, vomiting, diarrhea, fevers, or chills.  Medications: I have reviewed the patient's current medications.  Allergies:  Allergies  Allergen Reactions  . Ace Inhibitors Anaphylaxis    Shortness of breath   .  Codeine Nausea Only  . Demerol  [Meperidine Hcl]   . Dilaudid [Hydromorphone Hcl] Other (See Comments)    "total loss of her mind"    Past Medical History:  Diagnosis Date  . Endometrial cancer ([place]) 12/2007   s/p total abdominal hysterectomy at [facility] - ovaries were also removed   . Family history of breast cancer   . History of breast cancer    right breast --  treated with lumpectomy and radiation therarpy, postoperatively with tamoxifen   . History of radiation therapy 10/22/2016 to 10/28/2016  . Hypercholesterolemia   . Hyperlipidemia   . Hypertension   . Palpitations   . Personal history of radiation therapy 2006  . PVC's (premature ventricular contractions)   . Radiation 07/11/14-08/12/14   left lumbar paraspinal area 55 gray  . S/P radiation therapy 10/28/11-12/05/11   5040 cGy left pelvis  . S/P radiation therapy    Intracavitary brachytherapy of uterus    Past Surgical History:  Procedure Laterality Date  . APPENDECTOMY    . BREAST LUMPECTOMY Right 2000  . COLONOSCOPY     3X  . FOOT SURGERY     RIGHT  . SEGMENTECOMY Right 12/07/2018   Procedure: RIGHT LOWER LOBE SUPERIOR SEGMENTECTOMY;  Surgeon: Melrose Nakayama, MD;  Location: [place];  Service: Thoracic;  Laterality: Right;  . TONSILLECTOMY    . TOTAL ABDOMINAL HYSTERECTOMY W/ BILATERAL SALPINGOOPHORECTOMY    . VIDEO ASSISTED THORACOSCOPY Right 12/07/2018   Procedure: VIDEO ASSISTED THORACOSCOPY;  Surgeon: Melrose Nakayama, MD;  Location: [facility];  Service: Thoracic;  Laterality: Right;    Family History  Problem Relation Age of Onset  . Thrombosis Father        coronary thrombosis  . Hypertension Father   . Heart failure Mother   . Coronary artery disease Brother   . Breast cancer Maternal Aunt        dx in her 40s  . Stroke Maternal Uncle   . Stroke Maternal Grandfather     Social History   Socioeconomic History  . Marital status: Married    Spouse name: [name]  . Number of children: 2  . Years of education: Not on file  . Highest education level: Not on file  Occupational History  . Occupation: retired  Scientific laboratory technician  . Financial resource strain: Not on file  . Food insecurity    Worry: Not on file    Inability: Not on file  . Transportation needs    Medical: Not on file    Non-medical: Not on file  Tobacco Use  . Smoking status: Former Smoker     Types: Cigarettes    Quit date: 05/13/1962    Years since quitting: 56.8  . Smokeless tobacco: Never Used  Substance and Sexual Activity  . Alcohol use: Yes    Comment: ocass  . Drug use: No  . Sexual activity: Never  Lifestyle  . Physical activity    Days per week: Not on file    Minutes per session: Not on file  . Stress: Not on file  Relationships  . Social Herbalist on phone: Not on file    Gets together: Not on file    Attends religious service: Not on file    Active member of club or organization: Not on file    Attends meetings of clubs or organizations: Not on file    Relationship status: Not on file  . Intimate partner violence  Fear of current or ex partner: Not on file    Emotionally abused: Not on file    Physically abused: Not on file    Forced sexual activity: Not on file  Other Topics Concern  . Not on file  Social History Narrative  . Not on file    Past Medical History, Surgical history, Social history, and Family history were reviewed and updated as appropriate.   Please see review of systems for further details on the patient's review from today.   Review of Systems:  Review of Systems  Constitutional: Negative for appetite change, chills, diaphoresis and fever.  HENT: Negative for dental problem, mouth sores and trouble swallowing.   Respiratory: Positive for shortness of breath. Negative for cough and chest tightness.   Cardiovascular: Negative for chest pain and palpitations.  Gastrointestinal: Negative for constipation, diarrhea, nausea and vomiting.  Neurological: Positive for dizziness, tremors and weakness. Negative for syncope and headaches.    Objective:   Physical Exam:  There were no vitals taken for this visit. ECOG: 1  Oxygen Saturation:  Room air at rest:    100 % Room air with ambulation:   86 % (Lasted momentarily and was only noted when the patient was ambulating and talking at the same time.) At rest with oxygen  at 2LPM via an C: 100 %   Physical Exam Constitutional:      General: She is not in acute distress.    Appearance: She is not diaphoretic.  HENT:     Head: Normocephalic and atraumatic.     Right Ear: External ear normal.     Left Ear: External ear normal.     Mouth/Throat:     Pharynx: No oropharyngeal exudate.  Neck:     Musculoskeletal: Normal range of motion and neck supple.  Cardiovascular:     Rate and Rhythm: Normal rate and regular rhythm.     Heart sounds: Normal heart sounds. No murmur. No friction rub. No gallop.   Pulmonary:     Effort: Pulmonary effort is normal. No respiratory distress.     Breath sounds: Examination of the left-upper field reveals decreased breath sounds. Examination of the left-lower field reveals decreased breath sounds. Decreased breath sounds present. No wheezing or rales.    Lymphadenopathy:     Cervical: No cervical adenopathy.  Skin:    General: Skin is warm and dry.     Findings: No erythema or rash.  Neurological:     Mental Status: She is alert.     Coordination: Coordination normal.  Psychiatric:        Behavior: Behavior normal.        Thought Content: Thought content normal.        Judgment: Judgment normal.     Lab Review:     Component Value Date/Time   NA 142 03/15/2019 0852   NA 140 05/09/2017 0844   K 4.0 03/15/2019 0852   K 4.1 05/09/2017 0844   CL 109 03/15/2019 0852   CO2 23 03/15/2019 0852   CO2 26 05/09/2017 0844   GLUCOSE 79 03/15/2019 0852   GLUCOSE 82 05/09/2017 0844   BUN 19 03/15/2019 0852   BUN 20.7 05/09/2017 0844   CREATININE 0.86 03/15/2019 0852   CREATININE 0.7 05/09/2017 0844   CALCIUM 9.4 03/15/2019 0852   CALCIUM 9.3 05/09/2017 0844   PROT 6.9 03/15/2019 0852   PROT 6.8 05/09/2017 0844   ALBUMIN 3.6 03/15/2019 0852   ALBUMIN 3.6 05/09/2017 0844  AST 30 03/15/2019 0852   AST 27 05/09/2017 0844   ALT 19 03/15/2019 0852   ALT 17 05/09/2017 0844   ALKPHOS 52 03/15/2019 0852   ALKPHOS 43  05/09/2017 0844   BILITOT 0.5 03/15/2019 0852   BILITOT 0.81 05/09/2017 0844   GFRNONAA >60 03/15/2019 0852   GFRAA >60 03/15/2019 0852       Component Value Date/Time   WBC 5.1 03/15/2019 0852   WBC 7.4 01/07/2019 0840   RBC 3.08 (L) 03/15/2019 0852   HGB 9.7 (L) 03/15/2019 0852   HGB 12.2 05/09/2017 0844   HCT 29.6 (L) 03/15/2019 0852   HCT 36.9 05/09/2017 0844   PLT 174 03/15/2019 0852   PLT 180 05/09/2017 0844   MCV 96.1 03/15/2019 0852   MCV 96.7 05/09/2017 0844   MCH 31.5 03/15/2019 0852   MCHC 32.8 03/15/2019 0852   RDW 18.7 (H) 03/15/2019 0852   RDW 13.4 05/09/2017 0844   LYMPHSABS 1.0 03/15/2019 0852   LYMPHSABS 1.0 05/09/2017 0844   MONOABS 1.1 (H) 03/15/2019 0852   MONOABS 0.8 05/09/2017 0844   EOSABS 0.1 03/15/2019 0852   EOSABS 0.3 05/09/2017 0844   BASOSABS 0.0 03/15/2019 0852   BASOSABS 0.0 05/09/2017 0844   -------------------------------  Imaging from last 24 hours (if applicable):  Radiology interpretation: Dg Chest 2 View  Result Date: 02/26/2019 CLINICAL DATA:  Shortness of breath, gradual worsening. EXAM: CHEST - 2 VIEW COMPARISON:  02/09/2019 FINDINGS: Left-sided Port-A-Cath remains in place, tip in the mid SVC. Lungs are clear. No signs of effusion or consolidation. Changes of right axillary dissection are present. Spinal degenerative changes with moderate to marked curvature, levoconvexity centered about the thoracolumbar junction similar to prior study. No signs of acute bone finding. IMPRESSION: 1. No active cardiopulmonary disease. Electronically Signed   By: Zetta Bills M.D.   On: 02/26/2019 09:17

## 2019-03-23 NOTE — Telephone Encounter (Signed)
Patient called with concerns the side effects of treatment are lasting longer this cycle. She feels weaker each day and is not improving. She is very dizzy, light headed. She feels like she is trembling. Patient is SOB just like the last cycle.   She would like to see Lucianne Lei again if this is possible. She feels she needs medical intervention before it gets too serious.

## 2019-03-23 NOTE — Progress Notes (Signed)
Pt transported to radiology for CXR via w/c with belongings, will be called later with results.  PA Lucianne Lei called pt with negative results of CXR.

## 2019-03-30 ENCOUNTER — Other Ambulatory Visit: Payer: Self-pay | Admitting: Radiation Therapy

## 2019-04-01 ENCOUNTER — Ambulatory Visit (HOSPITAL_COMMUNITY)
Admission: RE | Admit: 2019-04-01 | Discharge: 2019-04-01 | Disposition: A | Discharge status: Home / Self Care | Payer: Medicare Other | Source: Ambulatory Visit | Attending: Radiation Oncology | Admitting: Radiation Oncology

## 2019-04-01 ENCOUNTER — Encounter: Payer: Self-pay | Admitting: Radiation Oncology

## 2019-04-01 ENCOUNTER — Ambulatory Visit
Admission: RE | Admit: 2019-04-01 | Discharge: 2019-04-01 | Disposition: A | Discharge status: Home / Self Care | Payer: Medicare Other | Source: Ambulatory Visit | Attending: Radiation Oncology | Admitting: Radiation Oncology

## 2019-04-01 ENCOUNTER — Other Ambulatory Visit: Payer: Self-pay

## 2019-04-01 DIAGNOSIS — Z8542 Personal history of malignant neoplasm of other parts of uterus: Secondary | ICD-10-CM | POA: Diagnosis not present

## 2019-04-01 DIAGNOSIS — C7951 Secondary malignant neoplasm of bone: Secondary | ICD-10-CM | POA: Diagnosis not present

## 2019-04-01 DIAGNOSIS — Z08 Encounter for follow-up examination after completed treatment for malignant neoplasm: Secondary | ICD-10-CM | POA: Diagnosis not present

## 2019-04-01 DIAGNOSIS — C549 Malignant neoplasm of corpus uteri, unspecified: Secondary | ICD-10-CM

## 2019-04-01 DIAGNOSIS — Z853 Personal history of malignant neoplasm of breast: Secondary | ICD-10-CM | POA: Diagnosis not present

## 2019-04-01 DIAGNOSIS — M48061 Spinal stenosis, lumbar region without neurogenic claudication: Secondary | ICD-10-CM | POA: Diagnosis not present

## 2019-04-01 DIAGNOSIS — R9389 Abnormal findings on diagnostic imaging of other specified body structures: Secondary | ICD-10-CM | POA: Diagnosis not present

## 2019-04-01 DIAGNOSIS — C775 Secondary and unspecified malignant neoplasm of intrapelvic lymph nodes: Secondary | ICD-10-CM

## 2019-04-01 MED ORDER — GADOBUTROL 1 MMOL/ML IV SOLN
5.0000 mL | Freq: Once | INTRAVENOUS | Status: AC | PRN
  Administered 2019-04-01: 5 mL via INTRAVENOUS

## 2019-04-04 NOTE — Progress Notes (Signed)
Radiation Oncology         [phone] ________________________________  Outpatient Follow Up - Conducted via telephone due to current COVID-19 concerns for limiting patient exposure  I spoke with the patient to conduct this consult visit via telephone to spare the patient unnecessary potential exposure in the healthcare setting during the current COVID-19 pandemic. The patient was notified in advance and was offered a [facility] meeting to allow for face to face communication but unfortunately reported that they did not have the appropriate resources/technology to support such a visit and instead preferred to proceed with a telephone visit. ________________________________  Name: [name] MRN: [id number]  Date: 04/01/2019  DOB: May 29, 1940  Follow Up Note  CC: Shon Baton, MD    Diagnosis:   Recurrent metastatic Stage I, pT1N0 papillary serous carcinoma of the uterus with disease in a paraspinal mass with history of retroperitoneal adenopathy.     Interval Since Last Radiation: 2 years, 5 months  10/22/2016 to 10/28/2016:  The L-spine, specifically L3 paraspinal tumor was treated to 27 Gy in 3 fractions at 9 Gy per fraction. To be more clear though, the L3 vertebral body was not treated.  07/11/14-08/12/14:  55 Gy in 25 fractions to the Lumbar spine  04/02/13:  Intraoperative radiotherapy to the left pelvis.  10/28/11-12/05/11: Radiotherapy to the left pelvic sidewall, details not available at time of dictation  2010:  Vaginal cuff brachytherapy, details of her dose are unknown  2000:  Adjuvant radiotherapy to the right breast, details are unknown  Narrative:   Kelly TORRE is a 78 y.o.  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 The patient has a history of Stage I, T1N0, ER/PR positive, HER2 2+ ductal right breast cancer in 2000 which was treated with lumpectomy, sentinel lymph node assessment and tamoxifen. She was diagnosed with  stage IB papillary serous endometrial carcinoma in August 2009, underwent radical hysterectomy with 15 negative lymph nodes however LVSI was noted. She completed 6 cycles of Taxol carboplatin in January 2010, followed by a vaginal cuff brachytherapy. Her uterine cancer recurred in the fall of 2012 in the pelvic sidewall. She continued chemotherapy, and proceeded with radiotherapy in 2013 to the site. She also developed disease in the pelvis, she subsequently underwent radical resection at [facility] in November 2014 with resection of pelvic mass, left ureteral lysis, intraoperative radiotherapy, omental J flap and left ureteral stent placement. She did well until 2016 when she had lumbar pain and an MRI in an orthopedic office revealed a 2.7 x 2.4 x 1.9 cm mass at the L3-L4 location with left neural foramen involvement, and focal destruction of L3. A CT guided biopsy confirmed high grade serous carcinoma consistent with her uterine cancer. She received Radiotherapy to the lumbar spine, and began Zometa along with her systemic therapy. She had new pulmonary disease identified in the right lower lobe in January 2017, this was stabilized with chemotherapy, and her paraspinal mass was also relatively stable until her scan on 09/02/16 measured this at 27 x 21 mm, previously 25 x 18 mm in January 2018. She did not have any other surgical options available, and underwent SRS to the paraspinal disease in June 2018. She was found to have progressive disease earlier this year and started systemic chemotherapy with Carbolplatin/Alimta under the direction of Dr. Alvy Bimler. She has plans for repeat PET on 04/14/2019 to determine response. She continues in surveillance in the brain and spine oncology conference. Her last  MRI on 04/01/19 revealed questionable infiltrative changes at the level of L3 vertebral body, though her paraspinal region where she had prior treatment was stable. She is contacted by phone to discuss these  findings.   On review of systems, the patient reports that she is doing well overall. She reports stable intermittent discomfort in the left thigh into the knee. She denies any new back pain or radiculopathy, weakness, or numbness.  She denies any chest pain, shortness of breath, cough, fevers, chills, night sweats, unintended weight changes. She denies any bowel or bladder disturbances, and denies abdominal pain, nausea or vomiting. She denies any new musculoskeletal or joint aches or pains, new skin lesions or concerns. A complete review of systems is obtained and is otherwise negative.    Past Medical History:  Past Medical History:  Diagnosis Date   Endometrial cancer ([place]) 12/2007   s/p total abdominal hysterectomy at [facility] - ovaries were also removed    Family history of breast cancer    History of breast cancer    right breast -- treated with lumpectomy and radiation therarpy, postoperatively with tamoxifen    History of radiation therapy 10/22/2016 to 10/28/2016   Hypercholesterolemia    Hyperlipidemia    Hypertension    Palpitations    Personal history of radiation therapy 2006   PVC's (premature ventricular contractions)    Radiation 07/11/14-08/12/14   left lumbar paraspinal area 55 gray   S/P radiation therapy 10/28/11-12/05/11   5040 cGy left pelvis   S/P radiation therapy    Intracavitary brachytherapy of uterus    Past Surgical History: Past Surgical History:  Procedure Laterality Date   APPENDECTOMY     BREAST LUMPECTOMY Right 2000   COLONOSCOPY     3X   FOOT SURGERY     RIGHT   SEGMENTECOMY Right 12/07/2018   Procedure: RIGHT LOWER LOBE SUPERIOR SEGMENTECTOMY;  Surgeon: Melrose Nakayama, MD;  Location: [facility] OR;  Service: Thoracic;  Laterality: Right;   TONSILLECTOMY     TOTAL ABDOMINAL HYSTERECTOMY W/ BILATERAL SALPINGOOPHORECTOMY     VIDEO ASSISTED THORACOSCOPY Right 12/07/2018   Procedure: VIDEO ASSISTED THORACOSCOPY;  Surgeon:  Melrose Nakayama, MD;  Location: [facility] OR;  Service: Thoracic;  Laterality: Right;    Social History:  Social History   Socioeconomic History   Marital status: Married    Spouse name: [name]   Number of children: 2   Years of education: Not on file   Highest education level: Not on file  Occupational History   Occupation: retired  Scientist, product/process development strain: Not hard at all   Food insecurity    Worry: Never true    Inability: Never true   Transportation needs    Medical: No    Non-medical: No  Tobacco Use   Smoking status: Former Smoker    Types: Cigarettes    Quit date: 05/13/1962    Years since quitting: 56.9   Smokeless tobacco: Never Used  Substance and Sexual Activity   Alcohol use: Yes    Comment: occasionally   Drug use: No   Sexual activity: Not Currently    Birth control/protection: Post-menopausal  Lifestyle   Physical activity    Days per week: Not on file    Minutes per session: Not on file   Stress: Not on file  Relationships   Social connections    Talks on phone: Not on file    Gets together: Not on file  Attends religious service: Not on file    Active member of club or organization: Not on file    Attends meetings of clubs or organizations: Not on file    Relationship status: Not on file   Intimate partner violence    Fear of current or ex partner: Not on file    Emotionally abused: Not on file    Physically abused: Not on file    Forced sexual activity: Not on file  Other Topics Concern   Not on file  Social History Narrative   Not on file  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  Family History: Family History  Problem Relation Age of Onset   Thrombosis Father        coronary thrombosis   Hypertension Father    Heart failure Mother    Coronary artery disease Brother    Breast cancer Maternal Aunt         dx in her 69s   Stroke Maternal Uncle    Stroke Maternal Grandfather      ALLERGIES:  is allergic to ace inhibitors; dilaudid [hydromorphone hcl]; codeine; and demerol [meperidine hcl].  Meds: Current Outpatient Medications  Medication Sig Dispense Refill   amLODipine (NORVASC) 5 MG tablet Take 5 mg by mouth daily.     atenolol (TENORMIN) 25 MG tablet Take 25 mg by mouth 2 (two) times daily.      Biotin 5000 MCG TABS Take 5,000 mcg by mouth daily.     folic acid (FOLVITE) 1 MG tablet Take 1 tablet (1 mg total) by mouth daily. Start 5-7 days before Alimta chemotherapy. Continue until 21 days after Alimta completed. 100 tablet 3   lidocaine-prilocaine (EMLA) cream Apply to [facility] cath 1-2 hours prior to access as directed 30 g 1   LORazepam (ATIVAN) 0.5 MG tablet 1 tablet po 30 minutes prior to radiation or MRI 30 tablet 0   Magnesium 250 MG TABS Take 250 mg by mouth daily.      Multiple Vitamin (MULTIVITAMIN WITH MINERALS) TABS tablet Take 1 tablet by mouth daily.     potassium gluconate 595 MG TABS tablet Take 595 mg by mouth daily.      simvastatin (ZOCOR) 20 MG tablet Take 20 mg by mouth every evening.      TURMERIC PO Take 1 capsule by mouth daily.     No current facility-administered medications for this encounter.    Facility-Administered Medications Ordered in Other Encounters  Medication Dose Route Frequency Provider Last Rate Last Dose   heparin lock flush 100 unit/mL  500 Units Intracatheter Once Gorsuch, Ni, MD       sodium chloride flush (NS) 0.9 % injection 10 mL  10 mL Intracatheter Once Heath Lark, MD        Physical Findings: Unable to assess due to encounter type.  Lab Findings: Lab Results  Component Value Date   WBC 2.2 (L) 03/23/2019   HGB 8.4 (L) 03/23/2019   HCT 25.5 (L) 03/23/2019   MCV 97.0 03/23/2019   PLT 94 (L) 03/23/2019     Radiographic Findings: Dg Chest 2 View  Result Date: 03/23/2019 CLINICAL DATA:  Shortness of breath,  history of lung cancer. EXAM: CHEST - 2 VIEW COMPARISON:  February 26, 2019. FINDINGS: The heart size and mediastinal contours are within normal limits. No pneumothorax or pleural effusion is noted. Surgical clip seen in right axillary  region. Scarring and postoperative changes noted in the right midlung. No acute abnormality is noted. The visualized skeletal structures are unremarkable. IMPRESSION: No active cardiopulmonary disease. Electronically Signed   By: Marijo Conception M.D.   On: 03/23/2019 13:48   Mr Lumbar Spine W Wo Contrast  Result Date: 04/01/2019 CLINICAL DATA:  Follow-up left paravertebral mass at L3. Status post RT. EXAM: MRI LUMBAR SPINE WITHOUT AND WITH CONTRAST TECHNIQUE: Multiplanar and multiecho pulse sequences of the lumbar spine were obtained without and with intravenous contrast. CONTRAST:  55m GADAVIST GADOBUTROL 1 MMOL/ML IV SOLN COMPARISON:  MRI lumbar spine 10/15/2018 and PET-CT 11/11/2018 FINDINGS: Segmentation: There are five lumbar type vertebral bodies. The last full intervertebral disc space is labeled L5-S1. This correlates with the prior MRI. Alignment: Stable scoliosis and advanced degenerative lumbar spondylosis. Stable anterolisthesis of L4. Vertebrae: New fairly extensive heterogeneous signal abnormality in the L3 vertebral body and subsequent contrast enhancement worrisome for tumor. Radiation necrosis could be possible but the pattern is not typical for that entity. The other vertebral bodies are unremarkable. I do not see any evidence for osseous metastatic disease elsewhere. Conus medullaris and cauda equina: Conus extends to the L1-2 level. Conus and cauda equina appear normal. Paraspinal and other soft tissues: The left paraspinal mass at L3-4 is relatively stable in size. It measures approximately 22 x 16.5 mm on image 20/6 and previously measured 24.5 x 17.5 mm. There is rim enhancement around a more cystic or necrotic appearing area. There is also persistent  abnormal increased T2 signal intensity and enhancement in the left psoas muscle. No obvious retroperitoneal adenopathy. Disc levels: Stable advanced degenerative disc disease and facet disease at L1-2, L2-3, L4-5 and L5-S1. Persistent spinal, left lateral recess and left foraminal stenosis at L4-5. L3-4: Persistent broad-based disc protrusion with mass effect on the left side of the thecal sac, significant left lateral recess stenosis and moderate left foraminal stenosis. The left L3 nerve root is likely compressed between the disc protrusion and the tumor. Stable severe facet disease at this level also. IMPRESSION: 1. New fairly extensive heterogeneous signal abnormality in the L3 vertebral body along with fairly marked enhancement worrisome for direct invasion of tumor. Radiation necrosis is felt to be unlikely. 2. Stable appearance of the left paraspinal tumor and abnormal left psoas muscle. 3. Scoliosis and degenerative lumbar spondylosis with multilevel disc disease and facet disease. 4. Stable spinal, left lateral recess and left foraminal stenosis at L4-5. 5. Stable appearing significant left lateral recess and foraminal stenosis at L3-4. Electronically Signed   By: PMarijo SanesM.D.   On: 04/01/2019 16:17    Impression/Plan: 1. 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  The patient appears to be doing very well and radiographically stable within  paraspinal region, we did discuss that her MRI did show some questionable change within the vertebral body which was not seen previously, but may be reflective of progressive fibrotic changes from prior radiotherapy. Dr. MLisbeth Renshawhas personally reviewed her imaging in comparison with her prior treatment planning images. We will await her PET results on 04/14/2019 to see the level of activity in that region. She is aware there may be a role for  additional therapy if this area in the bone was metabolically active, versus continued surveillance if there is a low level of suspicion for malignancy. She is in agreement with this plan. We will follow up with  her after her PET scan. She will otherwise continue with Dr. Alvy Bimler and with systemic therapy at her direction.  2. Left thigh/knee neuropathy. The paitent has been evaluated by Dr. Maryjean Ka in the past and since her symptoms are stable and don't interfere with her quality of life, this will be followed. Of note she has not tolerated gabapentin in the past for nerve pain due to degenerative changes.    Given current concerns for patient exposure during the COVID-19 pandemic, this encounter was conducted via telephone.  The patient has given verbal consent for this type of encounter. The time spent during this encounter was 10 minutes and 50% of that time was spent in the coordination of her care. The attendants for this meeting include Shona Simpson, [facility] and [name]  During the encounter, Shona Simpson [facility] was located at [facility] Radiation Oncology Department.  [name]  was located at home.     Carola Rhine, PAC

## 2019-04-06 ENCOUNTER — Telehealth: Payer: Medicare Other | Admitting: Radiation Oncology

## 2019-04-13 ENCOUNTER — Ambulatory Visit: Payer: Medicare Other | Admitting: Radiation Oncology

## 2019-04-14 ENCOUNTER — Other Ambulatory Visit: Payer: Self-pay

## 2019-04-14 ENCOUNTER — Ambulatory Visit (HOSPITAL_COMMUNITY)
Admission: RE | Admit: 2019-04-14 | Discharge: 2019-04-14 | Disposition: A | Discharge status: Home / Self Care | Payer: Medicare Other | Source: Ambulatory Visit | Attending: Hematology and Oncology | Admitting: Hematology and Oncology

## 2019-04-14 DIAGNOSIS — C3490 Malignant neoplasm of unspecified part of unspecified bronchus or lung: Secondary | ICD-10-CM | POA: Diagnosis not present

## 2019-04-14 DIAGNOSIS — I2510 Atherosclerotic heart disease of native coronary artery without angina pectoris: Secondary | ICD-10-CM | POA: Insufficient documentation

## 2019-04-14 DIAGNOSIS — K8020 Calculus of gallbladder without cholecystitis without obstruction: Secondary | ICD-10-CM | POA: Diagnosis not present

## 2019-04-14 DIAGNOSIS — K5730 Diverticulosis of large intestine without perforation or abscess without bleeding: Secondary | ICD-10-CM | POA: Diagnosis not present

## 2019-04-14 LAB — GLUCOSE, CAPILLARY: Glucose-Capillary: 87 mg/dL (ref 70–99)

## 2019-04-14 MED ORDER — FLUDEOXYGLUCOSE F - 18 (FDG) INJECTION
5.6700 | Freq: Once | INTRAVENOUS | Status: AC
  Administered 2019-04-14: 10:00:00 5.67 via INTRAVENOUS

## 2019-04-16 ENCOUNTER — Telehealth: Payer: Self-pay | Admitting: *Deleted

## 2019-04-16 NOTE — Telephone Encounter (Signed)
Patient called asking for PET Scan results from scan on Wednesday. She has an appt in January but was hoping to see or talk to Dr. Alvy Bimler before this appt.

## 2019-04-19 ENCOUNTER — Inpatient Hospital Stay (HOSPITAL_BASED_OUTPATIENT_CLINIC_OR_DEPARTMENT_OTHER): Payer: Medicare Other | Admitting: Hematology and Oncology

## 2019-04-19 ENCOUNTER — Telehealth: Payer: Self-pay | Admitting: Pharmacist

## 2019-04-19 ENCOUNTER — Telehealth: Payer: Self-pay | Admitting: Radiation Oncology

## 2019-04-19 ENCOUNTER — Other Ambulatory Visit: Payer: Self-pay | Admitting: Hematology and Oncology

## 2019-04-19 ENCOUNTER — Inpatient Hospital Stay: Payer: Medicare Other | Attending: Hematology and Oncology

## 2019-04-19 DIAGNOSIS — M419 Scoliosis, unspecified: Secondary | ICD-10-CM | POA: Insufficient documentation

## 2019-04-19 DIAGNOSIS — I700 Atherosclerosis of aorta: Secondary | ICD-10-CM | POA: Diagnosis not present

## 2019-04-19 DIAGNOSIS — I2510 Atherosclerotic heart disease of native coronary artery without angina pectoris: Secondary | ICD-10-CM | POA: Diagnosis not present

## 2019-04-19 DIAGNOSIS — Z7189 Other specified counseling: Secondary | ICD-10-CM | POA: Insufficient documentation

## 2019-04-19 DIAGNOSIS — M5137 Other intervertebral disc degeneration, lumbosacral region: Secondary | ICD-10-CM | POA: Insufficient documentation

## 2019-04-19 DIAGNOSIS — C549 Malignant neoplasm of corpus uteri, unspecified: Secondary | ICD-10-CM | POA: Diagnosis not present

## 2019-04-19 DIAGNOSIS — J984 Other disorders of lung: Secondary | ICD-10-CM | POA: Diagnosis not present

## 2019-04-19 DIAGNOSIS — M48061 Spinal stenosis, lumbar region without neurogenic claudication: Secondary | ICD-10-CM | POA: Diagnosis not present

## 2019-04-19 DIAGNOSIS — R590 Localized enlarged lymph nodes: Secondary | ICD-10-CM | POA: Diagnosis not present

## 2019-04-19 DIAGNOSIS — C3491 Malignant neoplasm of unspecified part of right bronchus or lung: Secondary | ICD-10-CM

## 2019-04-19 DIAGNOSIS — K5730 Diverticulosis of large intestine without perforation or abscess without bleeding: Secondary | ICD-10-CM | POA: Diagnosis not present

## 2019-04-19 DIAGNOSIS — C3431 Malignant neoplasm of lower lobe, right bronchus or lung: Secondary | ICD-10-CM | POA: Insufficient documentation

## 2019-04-19 DIAGNOSIS — C7951 Secondary malignant neoplasm of bone: Secondary | ICD-10-CM | POA: Insufficient documentation

## 2019-04-19 DIAGNOSIS — C541 Malignant neoplasm of endometrium: Secondary | ICD-10-CM

## 2019-04-19 DIAGNOSIS — R0602 Shortness of breath: Secondary | ICD-10-CM | POA: Insufficient documentation

## 2019-04-19 DIAGNOSIS — Z885 Allergy status to narcotic agent status: Secondary | ICD-10-CM | POA: Insufficient documentation

## 2019-04-19 DIAGNOSIS — Z79899 Other long term (current) drug therapy: Secondary | ICD-10-CM | POA: Insufficient documentation

## 2019-04-19 DIAGNOSIS — Z9221 Personal history of antineoplastic chemotherapy: Secondary | ICD-10-CM | POA: Insufficient documentation

## 2019-04-19 DIAGNOSIS — K8020 Calculus of gallbladder without cholecystitis without obstruction: Secondary | ICD-10-CM | POA: Diagnosis not present

## 2019-04-19 DIAGNOSIS — Z923 Personal history of irradiation: Secondary | ICD-10-CM | POA: Insufficient documentation

## 2019-04-19 MED ORDER — LENVIMA (10 MG DAILY DOSE) 10 MG PO CPPK: 10.0000 mg | ORAL_CAPSULE | Freq: Every day | ORAL | 11 refills | Status: DC

## 2019-04-19 NOTE — Telephone Encounter (Signed)
I called and LM for the patient to share the recommendations from conference which were to forgo radiotherapy due to prior therapies in that region, and pursue systemic therapy but reimage in the next 3-4 months. I encouraged her to call back so we could discuss further.

## 2019-04-19 NOTE — Telephone Encounter (Signed)
Spoke with her I will set up virtual meeting today since she is out of town to discuss options

## 2019-04-19 NOTE — Telephone Encounter (Signed)
Oral Oncology Pharmacist Encounter  Received new prescription for Lenvima (lenvatinib) for the treatment of metastatic endometrial cancer in conjunction with pembrolizumab, planned duration until disease progression or unacceptable drug toxicity. Planned start 05/03/2019.  CMP from 03/23/2019 assessed, no relevant lab abnormalities. TSH and UA labs ordered, CMP will be repeated. Prescription dose and frequency assessed. Md starting patient on a reduced dose of 10 mg.  Current medication list in Epic reviewed, no relevant DDIs with lenvatinib identified.  Prescription has been e-scribed to the [facility] for benefits analysis and approval.  Oral Oncology Clinic will continue to follow for insurance authorization, copayment issues, initial counseling and start date.  Darl Pikes, PharmD, BCPS, [facility] Hematology/Oncology Clinical Pharmacist [facility] [phone]  04/19/2019 2:13 PM

## 2019-04-19 NOTE — Progress Notes (Signed)
DISCONTINUE ON PATHWAY REGIMEN - Non-Small Cell Lung     A cycle is every 21 days:     Pemetrexed      Carboplatin   **Always confirm dose/schedule in your pharmacy ordering system**  REASON: Adjuvant Therapy Completed PRIOR TREATMENT: [id number]: Carboplatin AUC=5 + Pemetrexed 500 mg/m2 q21 Days x 4 Cycles TREATMENT RESPONSE: Complete Response (CR)  Non-Small Cell Lung - No Medical Intervention - Off Treatment.  Patient Characteristics: Stage IIB - III - Resected (Adjuvant), No Prior Chemotherapy, Nonsquamous Cell AJCC T Category: T1 Current Disease Status: No Distant Mets or Local Recurrence AJCC N Category: N2 AJCC M Category: M0 AJCC 8 Stage Grouping: Unknown Histology: Nonsquamous Cell

## 2019-04-19 NOTE — Progress Notes (Signed)
START OFF PATHWAY REGIMEN - Uterine   [id number]:Lenvatinib 20 mg PO Daily D1-21 + Pembrolizumab 200 mg IV D1 q21 Days:   A cycle is every 21 days:     Lenvatinib      Pembrolizumab   **Always confirm dose/schedule in your pharmacy ordering system**  Patient Characteristics: Papillary Serous, Recurrent/Progressive Disease, Third Line and Beyond, HER2 Negative/Unknown Histology: Papillary Serous Therapeutic Status: Recurrent or Progressive Disease AJCC T Category: T3 AJCC N Category: N0 AJCC M Category: M1 AJCC 8 Stage Grouping: IVB HER2 Status: Negative Line of Therapy: Third Line and Beyond Intent of Therapy: Non-Curative / Palliative Intent, Discussed with Patient

## 2019-04-20 ENCOUNTER — Telehealth: Payer: Self-pay

## 2019-04-20 ENCOUNTER — Other Ambulatory Visit: Payer: Self-pay | Admitting: Hematology and Oncology

## 2019-04-20 ENCOUNTER — Encounter: Payer: Self-pay | Admitting: Hematology and Oncology

## 2019-04-20 ENCOUNTER — Telehealth: Payer: Self-pay | Admitting: Hematology and Oncology

## 2019-04-20 NOTE — Telephone Encounter (Signed)
Oral Oncology Patient Advocate Encounter  Prior Authorization for [name] has been approved.    PA# [id number]  Effective dates: 04/19/19 through further notice  Patients co-pay is $3030.81  Oral Oncology Clinic will continue to follow.   [place] [facility] [place] Phone [phone] Fax [phone] 04/20/2019 4:22 PM

## 2019-04-20 NOTE — Telephone Encounter (Signed)
Erroneous encounter

## 2019-04-20 NOTE — Assessment & Plan Note (Signed)
I have reviewed PET CT scan finding with the patient and her husband Her case was discussed at the neuro-oncology tumor board She has signs of cancer relapse at the paraspinal region, most consistent with recurrence of her high-grade serous endometrial cancer that was previously biopsied and treated with radiation The patient is not candidate for repeat surgery or radiation and she understood the rationale behind this I do not recommend repeat biopsy We discussed systemic treatment options  We reviewed the current guidelines Goal is palliative I recommend switching her treatment with Lenvima and pembrolizumab.   The combination was approved following review conducted under [employer], an intiative of the [employer] which provides a framework for concurrent submission and review of oncology drugs among international partners. The FDA approved the combination with the [place] Therapeutic Goods Administration and [facility] [place].   Efficacy of the drugs together was investigated in Study 111/KEYNOTE-146 ([id number]), a single-arm, multicenter, open-label, multi-cohort trial that enrolled 108 patients with metastatic endometrial cancer that had progressed following at least one prior systemic therapy in any setting.  Patients took 20 mg of lenvatinib orally once daily in combination with 200 mg of pembrolizumab administered intravenously every 3 weeks until unacceptable toxicity or disease progression.  Among the 108 patients, 94 had tumors that were not MSI-H or dMMR, 11 had tumors that were MSI-H or dMMR, and in 3 patients the tumor MSI-H or dMMR status was not known.  Results  The major efficacy outcome measures were objective response rate (ORR) and duration of response (DOR) by independent radiologic review committee using RECIST 1.1. The ORR in the 94 patients whose tumors were not MSI-H or dMMR was 38.3% (95% CI: 29%, 49%) with 10 complete responses (10.6%) and 26  partial responses (27.7%). Median DOR was not reached at the time of data cutoff and 25 patients (69% of responders) had response durations ?6 months.  In another updated publication published on JCO: DOI: 10.1200/JCO.19.02627 Journal of Clinical Oncology, Published online July 24, 2018. Lenvatinib Plus Pembrolizumab in Patients With Advanced Endometrial Cancer Abstract PURPOSE  Patients with advanced endometrial carcinoma have limited treatment options. We report final primary efficacy analysis results for a patient cohort with advanced endometrial carcinoma receiving lenvatinib plus pembrolizumab in an ongoing phase Ib/II study of selected solid tumors. METHODS  Patients took lenvatinib 20 mg once daily orally plus pembrolizumab 200 mg intravenously once every 3 weeks, in 3-week cycles. The primary end point was objective response rate (ORR) at 24 weeks ([id number]); secondary efficacy end points included duration of response (DOR), progression-free survival (PFS), and overall survival (OS). Tumor assessments were evaluated by investigators per immune-related RECIST.  RESULTS  At data cutoff, 108 patients with previously treated endometrial carcinoma were enrolled, with a median follow-up of 18.7 months. The [id number] was 38.0% (95% CI, 28.8% to 47.8%). Among subgroups, the KZSWF09 (95% CI) was 63.6% (30.8% to 89.1%) in patients with microsatellite instability (MSI)-high tumors (n = 11) and 36.2% (26.5% to 46.7%) in patients with microsatellite-stable tumors (n = 94). For previously treated patients, regardless of tumor MSI status, the median DOR was 21.2 months (95% CI, 7.6 months to not estimable), median PFS was 7.4 months (95% CI, 5.3 to 8.7 months), and median OS was 16.7 months (15.0 months to not estimable). Grade 3 or 4 treatment-related adverse events occurred in 83/124 (66.9%) patients. CONCLUSION  Lenvatinib plus pembrolizumab showed promising antitumor activity in patients with advanced  endometrial carcinoma who have experienced disease progression after prior  systemic therapy, regardless of tumor MSI status. The combination therapy had a manageable toxicity profile. The most common adverse reactions for endometrial cancer were fatigue, hypertension, musculoskeletal pain, diarrhea, decreased appetite, hypothyroidism, nausea, stomatitis, vomiting, decreased weight, abdominal pain, headache, constipation, urinary tract infection, dysphonia, hemorrhagic events, hypomagnesemia, palmar-plantar erythrodysesthesia, dyspnea, cough, and rash.   The most common adverse reactions for endometrial cancer were fatigue, hypertension, musculoskeletal pain, diarrhea, decreased appetite, hypothyroidism, nausea, stomatitis, vomiting, decreased weight, abdominal pain, headache, constipation, urinary tract infection, dysphonia, hemorrhagic events, hypomagnesemia, palmar-plantar erythrodysesthesia, dyspnea, cough, and rash.   After much discussion, the patient would like to proceed with the plan of care. Given her body habitus, her age and previous severe pancytopenia, I recommend we proceed with upfront dose reduction of Lenvima at 10 mg daily The patient is instructed to check her blood pressure twice a day while on Lenvima Due to the patient out of town currently, we will start and plan her treatment to begin on December 21 I will call her the next week to see how she tolerate treatment I will see her in January for further follow-up

## 2019-04-20 NOTE — Progress Notes (Signed)
HEMATOLOGY-ONCOLOGY ELECTRONIC VISIT PROGRESS NOTE  Patient Care Team: Shon Baton, MD as PCP - General (Internal Medicine) Heath Lark, MD as Consulting Physician (Hematology and Oncology)  I connected with by [facility] video conference and verified that I am speaking with the correct person using two identifiers.  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   The combination was approved following review conducted under [employer], an intiative of the [place] which provides a framework for concurrent submission and review of oncology drugs among international partners. The FDA approved the combination with the [place] Therapeutic Goods Administration and [facility] [place].   Efficacy of the drugs together was investigated in Study 111/KEYNOTE-146 ([id number]), a single-arm, multicenter, open-label, multi-cohort trial that enrolled 108 patients with metastatic endometrial cancer that had progressed following at least one prior systemic therapy in any setting.  Patients took 20 mg of lenvatinib orally once daily in combination with 200 mg of pembrolizumab administered intravenously every 3 weeks until  unacceptable toxicity or disease progression.  Among the 108 patients, 94 had tumors that were not MSI-H or dMMR, 11 had tumors that were MSI-H or dMMR, and in 3 patients the tumor MSI-H or dMMR status was not known.  Results  The major efficacy outcome measures were objective response rate (ORR) and duration of response (DOR) by independent radiologic review committee using RECIST 1.1. The ORR in the 94 patients whose tumors were not MSI-H or dMMR was 38.3% (95% CI: 29%, 49%) with 10 complete responses (10.6%) and 26 partial responses (27.7%). Median DOR was not reached at the time of data cutoff and 25 patients (69% of responders) had response durations ?6 months.  In another updated publication published on JCO: DOI: 10.1200/JCO.19.02627 Journal of Clinical Oncology, Published online July 24, 2018. Lenvatinib Plus Pembrolizumab in Patients With Advanced Endometrial Cancer Abstract PURPOSE  Patients with advanced endometrial carcinoma have limited treatment options. We report final primary efficacy analysis results for a patient cohort with advanced endometrial carcinoma receiving lenvatinib plus pembrolizumab in an ongoing phase Ib/II study of selected solid tumors. METHODS  Patients took lenvatinib 20 mg once daily orally plus pembrolizumab 200 mg intravenously once every 3 weeks, in 3-week cycles. The primary end point was objective response rate (ORR) at 24 weeks (ACZYS06); secondary efficacy end points included duration of response (DOR), progression-free survival (PFS), and overall survival (OS). Tumor assessments were evaluated by investigators per immune-related RECIST.  RESULTS  At data cutoff, 108 patients with previously treated endometrial carcinoma were enrolled, with a median follow-up of 18.7 months. The TKZSW10 was 38.0% (95% CI, 28.8% to 47.8%). Among subgroups, the XNATF57 (95% CI) was 63.6% (30.8% to 89.1%) in patients with microsatellite instability (MSI)-high tumors (n = 11) and  36.2% (26.5% to 46.7%) in patients with  microsatellite-stable tumors (n = 94). For previously treated patients, regardless of tumor MSI status, the median DOR was 21.2 months (95% CI, 7.6 months to not estimable), median PFS was 7.4 months (95% CI, 5.3 to 8.7 months), and median OS was 16.7 months (15.0 months to not estimable). Grade 3 or 4 treatment-related adverse events occurred in 83/124 (66.9%) patients. CONCLUSION  Lenvatinib plus pembrolizumab showed promising antitumor activity in patients with advanced endometrial carcinoma who have experienced disease progression after prior systemic therapy, regardless of tumor MSI status. The combination therapy had a manageable toxicity profile. The most common adverse reactions for endometrial cancer were fatigue, hypertension, musculoskeletal pain, diarrhea, decreased appetite, hypothyroidism, nausea, stomatitis, vomiting, decreased weight, abdominal pain, headache, constipation, urinary tract infection, dysphonia, hemorrhagic events, hypomagnesemia, palmar-plantar erythrodysesthesia, dyspnea, cough, and rash.   The most common adverse reactions for endometrial cancer were fatigue, hypertension, musculoskeletal pain, diarrhea, decreased appetite, hypothyroidism, nausea, stomatitis, vomiting, decreased weight, abdominal pain, headache, constipation, urinary tract infection, dysphonia, hemorrhagic events, hypomagnesemia, palmar-plantar erythrodysesthesia, dyspnea, cough, and rash.   After much discussion, the patient would like to proceed with the plan of care. Given her body habitus, her age and previous severe pancytopenia, I recommend we proceed with upfront dose reduction of Lenvima at 10 mg daily The patient is instructed to check her blood pressure twice a day while on Lenvima Due to the patient out of town currently, we will start and plan her treatment to begin on December 21 I will call her the next week to see how she tolerate treatment I will see  her in January for further follow-up  Primary lung cancer, right [facility]) She has completed primary surgery and chemotherapy She will not complete adjuvant radiation therapy due to her cancer recurrence I am hopeful, pembrolizumab will also help reduce risk of cancer recurrence from the lung standpoint  Goals of care, counseling/discussion We had previous goals of care discussion in the past She understood that treatment goal is strictly palliative in nature   Orders Placed This Encounter  Procedures   CBC with Differential ([place] Only)    Standing Status:   Standing    Number of Occurrences:   20    Standing Expiration Date:   04/18/2020   CMP ([place] only)    Standing Status:   Standing    Number of Occurrences:   20    Standing Expiration Date:   04/18/2020   TSH    Standing Status:   Standing    Number of Occurrences:   20    Standing Expiration Date:   04/18/2020   Total Protein, Urine dipstick    Standing Status:   Standing    Number of Occurrences:   20    Standing Expiration Date:   04/18/2020    INTERVAL HISTORY: Please see below for problem oriented charting. Since last time I saw her, she feels well She is currently out of town She is not symptomatic; specifically, she have no back pain or neurological deficit She seems to be recovering well from recent side effects of chemotherapy SUMMARY OF ONCOLOGIC HISTORY: Oncology History Overview Note  Biopsy of recurrence 06-2014 ER negative. PDL1 testing low at 2% on the uterine cancer She progressed on carboplatin and Paclitaxel for uterine cancer HER 2 negative on pathology [id number] Negative genetic testing  On her lung cancer sample, [facility] testing revealed 10% PD-1 positive EGFR mutation was detected, exon 19 deletion   Malignant neoplasm of corpus uteri, except isthmus (  [place])  12/22/2007 Surgery   Staging for IA UPSC    - 05/28/2008 Chemotherapy   6 cycles of paclitaxel and carboplatin  with vaginal cuff brachytherapy   03/22/2011 Relapse/Recurrence   Left pelvic node recurrence   04/25/2011 Surgery   L/S evaluation, not resectable.   05/16/2011 - 09/25/2011 Chemotherapy   6 cyclces paclitaxel and carboplatin    - 12/27/2011 Radiation Therapy   radiation completed   03/10/2013 Progression   progression left pelvic mass   04/02/2013 Surgery   Resection and IORT   06/13/2014 Relapse/Recurrence   soft tissue mass at L3-4   07/11/2014 - 08/12/2014 Radiation Therapy   IMRT + zometa   02/12/2016 Progression      - 01/18/2016 Chemotherapy   The patient had ondansetron (ZOFRAN) IVPB 16 mg, 16 mg (100 % of original dose 16 mg), Intravenous,  Once, 5 of 5 cycles Dose modification: 16 mg (original dose 16 mg, Cycle 2)  CARBOplatin (PARAPLATIN) 390 mg in sodium chloride 0.9 % 100 mL chemo infusion, 390 mg (100 % of original dose 392 mg), Intravenous,  Once, 5 of 5 cycles Dose modification: 392 mg (original dose 392 mg, Cycle 1), 446.5 mg (original dose 392 mg, Cycle 2), 390 mg (original dose 392 mg, Cycle 2), 342 mg (original dose 392 mg, Cycle 6)  CARBOplatin chemo intradermal Test Dose 100 mcg, 0.1 mg, Intradermal,  Once, 5 of 5 cycles  PACLitaxel (TAXOL) 252 mg in dextrose 5 % 250 mL chemo infusion (> 25m/m2), 175 mg/m2 = 252 mg, Intravenous,  Once, 6 of 6 cycles Dose modification: 135 mg/m2 (original dose 175 mg/m2, Cycle 5)  CARBOplatin (PARAPLATIN) 250 mg in sodium chloride 0.9 % 100 mL chemo infusion, 250 mg (100 % of original dose 254.4 mg), Intravenous,  Once, 6 of 6 cycles Dose modification:   (original dose 254.4 mg, Cycle 1)  PACLitaxel (TAXOL) 114 mg in dextrose 5 % 250 mL chemo infusion ( ondansetron (ZOFRAN) 16 mg, dexamethasone (DECADRON) 20 mg in sodium chloride 0.9 % 50 mL IVPB, , Intravenous,  Once, 6 of 6 cycles  for chemotherapy treatment.     01/07/2019 - 04/04/2019 Chemotherapy   The patient had palonosetron (ALOXI) injection 0.25 mg, 0.25 mg,  Intravenous,  Once, 4 of 4 cycles Administration: 0.25 mg (01/07/2019), 0.25 mg (02/19/2019), 0.25 mg (03/15/2019), 0.25 mg (02/01/2019) PEMEtrexed (ALIMTA) 700 mg in sodium chloride 0.9 % 100 mL chemo infusion, 500 mg/m2 = 700 mg, Intravenous,  Once, 4 of 4 cycles Administration: 700 mg (01/07/2019), 700 mg (02/19/2019), 700 mg (03/15/2019), 700 mg (02/01/2019) CARBOplatin (PARAPLATIN) 300 mg in sodium chloride 0.9 % 250 mL chemo infusion, 300 mg (100 % of original dose 304.5 mg), Intravenous,  Once, 4 of 4 cycles Dose modification:   (original dose 304.5 mg, Cycle 1) Administration: 300 mg (01/07/2019), 300 mg (02/19/2019), 300 mg (03/15/2019), 300 mg (02/01/2019) fosaprepitant (EMEND) 150 mg, dexamethasone (DECADRON) 12 mg in sodium chloride 0.9 % 145 mL IVPB, , Intravenous,  Once, 4 of 4 cycles Administration:  (01/07/2019),  (02/19/2019),  (03/15/2019),  (02/01/2019)  for chemotherapy treatment.    05/03/2019 -  Chemotherapy   The patient had pembrolizumab (KEYTRUDA) 200 mg in sodium chloride 0.9 % 50 mL chemo infusion, 200 mg, Intravenous, Once, 0 of 6 cycles  for chemotherapy treatment.    [place]ometrial [place]  12/[place]/2007 Initial Diagnosis   She has recurrent papillary serous endometrial carcinoma. Briefly she was diagnosed in 2009 with a FIGO IB pappilary serous carcinoma  treated with hysterectomy followed by adjuvant chemo and vaginal brachytherapy. She then had a recurrence diagnosed in late 2012 that was deemed not to be surgically resectable, and was treated with 6 cycles of carbo/taxol followed by 5040 cGy of EBRT to the pelvic mass. She was then followed with serial imaging and was noted in June of this year to have slight increase in the size of the mass, and again in September there was small enlargement of the mass. She had a re-staging PET scan on 03/10/13 that showed FDG activity in the pelvic mass, with no other areas of disease   12/01/2007 Imaging   CT scan of chest, abdomen and  pelvis: 1.  Mass like area of decreased attenuation in the central uterus, consistent with the known endometrial carcinoma.  Deep myometrial invasion is suspected.  Pelvic MRI without and with contrast could be performed for further staging workup if clinically warranted. 2.  No evidence of extrauterine extension or lymphadenopathy. 3.  Sigmoid diverticulosis incidentally noted.   12/15/2007 Surgery   --12/2007 laparoscopic hysterectomy and PLND --05/2008 complted 6 cycles adjuvant carbo/taxol followed by vaginal cuff brachy --03/2011 exploratory lararoscopy, ureterolysis --4/13 completed 6 cycels carbotaxol --8/13 completed EBRT to the left pelvic sidewall    06/10/2008 Imaging   CT scan abdomen and pelvis 1.  Nonspecific mildly prominent inguinal lymph nodes, right greater than left. 2.  Interval hysterectomy without evidence of recurrent pelvic mass or fluid collection.   03/22/2011 Imaging   Ct scan abdomen and pelvis 1.  Local uterine cancer recurrence with two large nodes along the left pelvic sidewall. 2.  No evidence of bowel obstruction, urinary obstruction, or more distant metastasis.   03/31/2011 Relapse/Recurrence   chemotherapy and external beam radiation   05/16/2011 - 09/17/2011 Chemotherapy   She had 6 cycles of carboplatin and Taxol    07/17/2011 Imaging   Ct scan abdomen and pelvis 1.  Interval improvement in the previously demonstrated left pelvic local recurrence of tumor. 2.  No disease progression or complication identified. 3.  Mild bladder wall thickening on the right, nonspecific and possibly related to incomplete distension and/or radiation therapy. 4.  Cholelithiasis   10/11/2011 Imaging   CT scan abdomen and pelvis 1.  Interval enlargement of the left pelvic sidewall masses. 2.  No new nodular disease in the pelvis or lymphadenopathy. 3.  No evidence  of distant metastasis. 4.  Mild hydronephrosis on the right is similar to prior. 5.  Focal thickening of the  right aspect the bladder is stable compared to prior.    10/28/2011 - 12/05/2011 Radiation Therapy   radiation for pelvic sidewall recurrence   10/28/2011 - 12/05/2011 Radiation Therapy   10/28/11-12/05/11: Radiotherapy to the left pelvic sidewall   03/18/2012 Imaging   CT abdomen 1.  Today's study demonstrates a positive response to therapy with decreased size of left pelvic sidewall nodal masses, as detailed above.  No new soft tissue masses or new lymphadenopathy is identified within the abdomen or pelvis. Continue attention on follow-up studies is recommended. 2.  Cholelithiasis without findings to suggest acute cholecystitis. 3.  Colonic diverticulosis without findings to suggest acute diverticulitis at this time. 4.  Mild asymmetric urinary bladder wall thickening is unchanged compared to prior examinations and is nonspecific.  No definite bladder wall mass is identified at this time. 5.  Additional findings, similar to prior examinations, as above   06/22/2012 Imaging   CT abdomen 1.  Further decreased size of the left pelvic peripherally  enhancing lesion. 2.  No new sites of new or progressive disease. 3. Esophageal air fluid level suggests dysmotility or gastroesophageal reflux. 4.  Cholelithiasis   11/02/2012 Imaging   CT abdomen 1.  The left pelvic side wall lesion demonstrates mild increase in size from previous exam. 2.  No new sites of new or progressive disease. 3.  Cholelithiasis.    01/28/2013 Imaging   CT abdomen 1. Interval small increase in volume of enhancing mass adjacent to the left pelvic sidewall. 2. No evidence of abdominal or pelvic lymphadenopathy   04/02/2013 Surgery   pelvic mass resection with IORT   04/02/2013 - 04/02/2013 Radiation Therapy   04/02/13: Intraoperative radiotherapy to the left pelvis   05/10/2013 PET scan   1. Hypermetabolic mass in the deep left pelvis consistent with metastasis. 2. No additional evidence of local metastasis. No  evidence of distant metastasis. 3. Small right lower lobe pulmonary nodule is not hypermetabolic.   07/27/2013 Imaging   No evidence of recurrent or metastatic disease. Apparent prior resection of the deep left pelvic metastasis.   02/08/2014 Imaging   No CT findings for recurrent or metastatic disease involving the abdomen/ pelvis   06/13/2014 Relapse/Recurrence   + recurrence paraspinous muscle   06/21/2014 Procedure   There is a soft tissue lesion along the left side of L3-L4. This lesion roughly measures up to 2.2 cm. Needle was positioned along the posterior aspect of the lesion. Small amount of air in the paraspinal tissues following needle removal   06/21/2014 Pathology Results   Bone, biopsy, left lumbar paraspinal - METASTATIC POORLY DIFFERENTIATED CARCINOMA, CONSISTENT WITH HIGH GRADE SEROUS CARCINOMA SEE COMMENT. Microscopic Comment The carcinoma demonstrates the following immunophenotype: Cytokeratin 7 - patchy moderate to strong expression Estrogen receptor - negative expression P53 - strong diffuse expression TTF-1 - negative expression WT-1 - negative expression CD56 - focal moderate strong expression Synaptophysin - negative expression GCDFP - negative expression The history of primary endometrial papillary serous carcinoma and primary mammary carcinoma is noted. In the current case, the overall morphologic and immunophenotype are that of poorly differentiated carcinoma, consistent with high grade serous carcinoma.    07/11/2014 - 08/12/2014 Radiation Therapy   07/11/14-08/12/14: 55 Gy in 25 fractions to the Lumbar spine   08/15/2014 Imaging   CT scan of chest, abdomen and pelvis 1. Since the biopsy study of 06/21/2014, similar to slight decrease in size of a left paraspinous lesion at L3-4. 2. No new sites of metastatic disease identified. 3. 8 mm ground-glass nodule in the right lower lobe is grossly similar to 03/10/2013, suggesting a benign etiology. 4. Cholelithiasis. 5.  Apparent sigmoid colonic wall thickening which could be due to underdistention. Colitis felt less likely. 6.  Atherosclerosis, including within the coronary arteries. 7. Pelvic floor laxity.   10/07/2014 Imaging   CT scan of chest, abdomen and pelvis: Stable to slight interval decrease in size of left paraspinous lesion at L3-4. No new sites of metastatic disease. Unchanged ground-glass nodule within the right lower lobe, potentially benign in etiology. Recommend attention on followup. Cholelithiasis. Sigmoid colonic diverticulosis. No CT evidence to suggest acute diverticulitis.   01/19/2015 Imaging   Ct scan of abdomen and pelvis 1. Decrease in size of left paraspinous metastasis. 2. No new sites of disease. 3. Stable ground-glass attenuating nodule within the superior segment of right lower lobe. 4. Gallstones   05/02/2015 Imaging   Ct abdomen and pelvis: Slight interval increase in size of left lumbar paraspinal soft tissue metastasis.  No new sites of metastatic disease identified within the abdomen or pelvis.    05/11/2015 PET scan   1. The small soft tissue mass just lateral to the left L3 for neural foramen is hypermetabolic, favoring malignancy. 2. There is also a small focus of hypermetabolic activity between the left L1 and L2 transverse processes, but without a CT correlate. 3. The 13 mm sub solid nodule in the superior segment right lower lobe is stable from recent exams but has slowly increased in size over the last 7 years. Although not hypermetabolic, the appearance is concerning for the possibility of a low grade adenocarcinoma. 4. Other imaging findings of potential clinical significance: Chronic bilateral maxillary sinusitis. Coronary, aortic arch, and branch vessel atherosclerotic vascular disease. Aortoiliac atherosclerotic vascular disease. Cholelithiasis. Sigmoid colon diverticulosis. Lumbar scoliosis.   07/11/2015 Imaging   Ct scan of chest, abdomen and pelvis: 1. 13 mm  mixed solid and sub solid nodule in the posterior right lower lobe was present in 2009 and has clearly progressed in the interval since that study. Imaging features remain highly concerning for low-grade or well differentiated adenocarcinoma. 2. Slight increase size of in the hypermetabolic, rim enhancing nodule identified adjacent to the left L3-4 neural foramen. Metastatic disease remains a concern. 3. No evidence for discrete soft tissue lesion between the left L1 and 2 transverse processes, the site of focal FDG uptake on the recent PET-CT. 4. Cholelithiasis. 5. Abdominal aortic atherosclerosis   08/03/2015 - 01/16/2016 Chemotherapy   She received carboplatin and Taxol   11/06/2015 PET scan   1. Hypermetabolic left W2-9 paraspinous metastasis (biopsy-proven) is stable in size and slightly decreased in metabolism. 2. Previously described small focus of hypermetabolism between the left L2 and L3 spinous processes has resolved. 3. New mild linear hypermetabolism to the left of the T11-12 spinous processes without discrete mass on the CT images, favor benign activity related activity, recommend attention on follow-up PET-CT.  4. No definite new sites of hypermetabolic metastatic disease. No recurrent hypermetabolic metastatic disease in the pelvis. 5. Interval stability of subsolid 1.3 cm superior segment right lower lobe pulmonary nodule without associated significant metabolism, which has grown compared to the 2009 chest CT study, and remain suspicious for low grade adenocarcinoma. 6. Additional findings include aortic atherosclerosis, coronary atherosclerosis, mild multinodular goiter with no hypermetabolic thyroid nodules, cholelithiasis and moderate sigmoid diverticulosis.   02/12/2016 PET scan   1. Interval increase in size and metabolic activity of the LEFT paraspinal soft tissue metastasis. 2. New activity within the musculature of the LEFT chest wall. Given the unusual location of the  paraspinal metastasis cannot exclude a second soft tissue metastasis to the musculature however favor benign physiologic activity. 3. Stable RIGHT lower lobe pulmonary nodule. 4. No evidence of local recurrence.     04/08/2016 Imaging   Ct scan of chest, abdomen and pelvis: 1. Similar size of a  left paravertebral abdominal lesion. 2. No new or progressive metastatic disease. 3. No correlate for the muscular activity about the lateral left chest wall. 4.  Coronary artery atherosclerosis. Aortic atherosclerosis. 5. Cholelithiasis. 6. Similar right lower lobe pulmonary nodule.   06/03/2016 Imaging   CT abdomen and pelvis 1. Similar to mild enlargement of a paravertebral soft tissue lesion at the L3-4 level. 2. No new sites of disease identified. 3. Cholelithiasis. 4. Hysterectomy. 5.  Aortic atherosclerosis.    09/02/2016 Imaging   CT abdomen and pelvis 1. Continue mild interval increase in size of LEFT paraspinal mass (1-2 mm).  2. No evidence of new metastatic disease in the abdomen pelvis. 3. Post hysterectomy anatomy   09/26/2016 Imaging   MR lumbar spine 1. Left paraspinal metastasis with epicenter adjacent to the left L3 neural foramen does extend into the foramen to the level of the left lateral epidural space (series 9, image 31), but also tracks cephalad and caudal along the L2-L4 lumbar plexus (series 10, image 15). No extension into the left L2 or L4 neural foramina. And no more distal extension of tumor. 2. Abnormal signal in the medial left psoas and ventral left erector spinae muscles at L2 and L3 is probably denervation related. 3. No bone invasion or osseous metastatic disease. Suspect previous radiation of the L2 through L5 spinal levels. 4. No dural or intradural metastatic disease.    10/22/2016 Procedure   She underwent stereotactic Radiosurgery   10/30/2016 Genetic Testing   Patient has genetic testing done for Inheritable genetic mutation panel. Results  revealed patient has no actionable mutation.   01/21/2017 Imaging   1. Reduced size and conspicuity of the left paraspinal mass at the L3-4 level. The mass still present but has enhancement similar to that of adjacent psoas musculature. 2. No new metastatic disease is identified. 3. Other imaging findings of potential clinical significance: Cholelithiasis. Aortic Atherosclerosis (ICD10-I70.0). Sigmoid diverticulosis. Lumbar scoliosis.   05/09/2017 Imaging   Ct scan of abdomen and pelvis 1. Paraspinal mass adjacent to the left side of L3-L4 is less distinct than prior examinations, and is therefore difficult to discretely measure, however, the overall appearance suggests continued positive response to therapy. 2. No new signs of metastatic disease elsewhere in the abdomen or pelvis. 3. Aortic atherosclerosis, in addition to at least right coronary artery disease. Assessment for potential risk factor modification, dietary therapy or pharmacologic therapy may be warranted, if clinically indicated. 4. Colonic diverticulosis without evidence of acute diverticulitis at this time. 5. Additional incidental findings, as above. Aortic Atherosclerosis (ICD10-I70.0).   08/14/2017 Imaging   1. Treated left paraspinal mass centered at L3-4. Mass size is stable but there has been some evolution of tumor characteristics with less central fluid seen today. Recommend continued surveillance. 2. No evidence of untreated metastasis. 3. Degenerative changes and related impingement are described above.   10/28/2017 Imaging   Stable small left paraspinal soft tissue mass. No new or progressive disease within the abdomen or pelvis.  Cholelithiasis.  No radiographic evidence of cholecystitis.  Colonic diverticulosis, without radiographic evidence of diverticulitis.   04/30/2018 Imaging   Stable post treatment change of the partially necrotic LEFT paravertebral mass at L3-L4. 18 x 21 mm cross-section. Mass effect on  the LEFT L3 nerve root redemonstrated. Enhancing and atrophic LEFT psoas is inseparable.   10/15/2018 Imaging   1. Stable post treatment size and appearance of partially necrotic left paravertebral mass at L3-4, measuring 19 x 25 mm on current exam (unchanged when measured at similar level on previous study). Mass effect on the adjacent left L3 nerve root is unchanged. Adjacent enhancing and atrophic left psoas muscle. 2. Left convex scoliosis with associated multilevel degenerative spondylolysis and facet arthrosis, unchanged   10/29/2018 Imaging   Stable small left L3 paraspinal soft tissue mass. No evidence of new or progressive metastatic disease, or other acute findings.   Cholelithiasis.  No radiographic evidence of cholecystitis.   Colonic diverticulosis, without radiographic evidence of diverticulitis.   11/11/2018 PET scan   1. Low level metabolism (max SUV 2.0) associated with the irregular subsolid superior segment right  lower lobe 2.1 cm pulmonary nodule. As better depicted on the recent diagnostic chest CT study, this nodule crosses the major fissure into the right upper lobe and has increased in size and density on multiple imaging studies back to 2009. Findings are most suggestive of primary bronchogenic adenocarcinoma. 2. No hypermetabolic thoracic adenopathy. 3. Persistent hypermetabolism (max SUV 8.0) associated with the known left L3-4 paraspinous metastasis, mildly decreased in metabolism since 02/12/2016 PET-CT. 4. No additional sites of hypermetabolic metastatic disease in the abdomen or pelvis. No metabolic evidence of peritoneal recurrence. No ascites. 5. Chronic findings include: Aortic Atherosclerosis (ICD10-I70.0). Coronary atherosclerosis. Chronic bilateral maxillary sinusitis. Cholelithiasis. Moderate sigmoid diverticulosis.   01/07/2019 - 04/04/2019 Chemotherapy   The patient had palonosetron (ALOXI) injection 0.25 mg, 0.25 mg, Intravenous,  Once, 4 of 4  cycles Administration: 0.25 mg (01/07/2019), 0.25 mg (02/19/2019), 0.25 mg (03/15/2019), 0.25 mg (02/01/2019) PEMEtrexed (ALIMTA) 700 mg in sodium chloride 0.9 % 100 mL chemo infusion, 500 mg/m2 = 700 mg, Intravenous,  Once, 4 of 4 cycles Administration: 700 mg (01/07/2019), 700 mg (02/19/2019), 700 mg (03/15/2019), 700 mg (02/01/2019) CARBOplatin (PARAPLATIN) 300 mg in sodium chloride 0.9 % 250 mL chemo infusion, 300 mg (100 % of original dose 304.5 mg), Intravenous,  Once, 4 of 4 cycles Dose modification:   (original dose 304.5 mg, Cycle 1) Administration: 300 mg (01/07/2019), 300 mg (02/19/2019), 300 mg (03/15/2019), 300 mg (02/01/2019) fosaprepitant (EMEND) 150 mg, dexamethasone (DECADRON) 12 mg in sodium chloride 0.9 % 145 mL IVPB, , Intravenous,  Once, 4 of 4 cycles Administration:  (01/07/2019),  (02/19/2019),  (03/15/2019),  (02/01/2019)  for chemotherapy treatment.    05/03/2019 -  Chemotherapy   The patient had pembrolizumab (KEYTRUDA) 200 mg in sodium chloride 0.9 % 50 mL chemo infusion, 200 mg, Intravenous, Once, 0 of 6 cycles  for chemotherapy treatment.    Metastatic cancer to bone ([place])  06/27/2014 Initial Diagnosis   Metastatic cancer to bone   Primary lung cancer, right ([place])  11/04/2018 Initial Diagnosis   Primary lung cancer, right ([place])   11/11/2018 PET scan   1. Low level metabolism (max SUV 2.0) associated with the irregular subsolid superior segment right lower lobe 2.1 cm pulmonary nodule. As better depicted on the recent diagnostic chest CT study, this nodule crosses the major fissure into the right upper lobe and has increased in size and density on multiple imaging studies back to 2009. Findings are most suggestive of primary bronchogenic adenocarcinoma. 2. No hypermetabolic thoracic adenopathy. 3. Persistent hypermetabolism (max SUV 8.0) associated with the known left L3-4 paraspinous metastasis, mildly decreased in metabolism since 02/12/2016 PET-CT. 4. No additional sites of  hypermetabolic metastatic disease in the abdomen or pelvis. No metabolic evidence of peritoneal recurrence. No ascites. 5. Chronic findings include: Aortic Atherosclerosis (ICD10-I70.0). Coronary atherosclerosis. Chronic bilateral maxillary sinusitis. Cholelithiasis. Moderate sigmoid diverticulosis.   12/07/2018 Pathology Results   1. Lung, resection (segmental or lobe), Right Lobe Superior with endblock wedge of right upper lobe - INVASIVE ADENOCARCINOMA, MODERATELY DIFFERENTIATED, SPANNING 1.6 CM. - THE SURGICAL RESECTION MARGINS ARE NEGATIVE FOR CARCINOMA. - SEE ONCOLOGY TABLE BELOW. 2. Lymph node, biopsy, Level 9 #1 - THERE IS NO EVIDENCE OF CARCINOMA IN 1 OF 1 LYMPH NODE (0/1). 3. Lymph node, biopsy, Level 9 #2 - THERE IS NO EVIDENCE OF CARCINOMA IN 1 OF 1 LYMPH NODE (0/1). 4. Lymph node, biopsy, Level 7 #1 - METASTATIC CARCINOMA IN 1 OF 1 LYMPH NODE (1/1). 5. Lymph node, biopsy, Level 7 #2 -  THERE IS NO EVIDENCE OF CARCINOMA IN 1 OF 1 LYMPH NODE (0/1). 6. Lymph node, biopsy, Level 12 #1 - THERE IS NO EVIDENCE OF CARCINOMA IN 1 OF 1 LYMPH NODE (0/1). 7. Lymph node, biopsy, Level 12 #2 - THERE IS NO EVIDENCE OF CARCINOMA IN 1 OF 1 LYMPH NODE (0/1). 8. Lymph node, biopsy, Level 10 #1 - THERE IS NO EVIDENCE OF CARCINOMA IN 1 OF 1 LYMPH NODE (0/1). 9. Lymph node, biopsy, Level 4R #1 - THERE IS NO EVIDENCE OF CARCINOMA IN 1 OF 1 LYMPH NODE (0/1). 10. Lymph node, biopsy, Level 4R #2 - THERE IS NO EVIDENCE OF CARCINOMA IN 1 OF 1 LYMPH NODE (0/1). 11. Lymph node, biopsy, Level 2R #1 - THERE IS NO EVIDENCE OF CARCINOMA IN 1 OF 1 LYMPH NODE (0/1). 12. Lung, resection (segmental or lobe), Right Lobe Superior - BENIGN LUNG PARENCHYMA. - THERE IS NO EVIDENCE OF MALIGNANCY. Procedure: Segmentectomy. Specimen Laterality: Right. Tumor Site: Right superior lobe. Tumor Size: 1.6 cm (gross measurement). Tumor Focality: Unifocal. Histologic Type: Adenocarcinoma, moderately  differentiated. Visceral Pleura Invasion: Not identified. Lymphovascular Invasion: Not identified. Direct Invasion of Adjacent Structures: Not identified. Margins: Negative for carcinoma. Treatment Effect: N/A Regional Lymph Nodes: Number of Lymph Nodes Involved: 1 (level 7) Number of Lymph Nodes Examined: 10 Pathologic Stage Classification (pTNM, AJCC 8th Edition): pT1b, pN2 Ancillary Studies: The tumor cells are positive for Napsin-A, TTF-1, and p53. They are essentially negative for estrogen receptor, PAX-8 and progesterone receptor. Additional studies can be performed upon clinician request. Representative Tumor Block: 1D-1E. (JBK:gt, 12/09/18)   12/07/2018 Surgery   PREOPERATIVE DIAGNOSIS:  Right lung nodule.   POSTOPERATIVE DIAGNOSIS:  Non-small cell carcinoma- suspected primary lung carcinoma, clinical stage T2N01b.   PROCEDURE:   Right video-assisted thoracoscopy, Right lower lobe superior segmentectomy with en bloc wedge resection of right upper lobe, Lymph node dissection, Intercostal nerve block levels 3-9.   SURGEON:  Modesto Charon, MD   FINDINGS:  Mass palpable in posterior aspect of superior segment of right lower lobe, likely involvement across fissure into the upper lobe.  Frozen section revealed non-small cell carcinoma. upper and lower lobe margins clear.   12/23/2018 Cancer Staging   Staging form: Lung, AJCC 8th Edition - Pathologic: Stage IIIA (pT1b, pN2, cM0) - Signed by Heath Lark, MD on 12/23/2018   01/05/2019 Imaging   MRI brain 1. No metastatic disease or acute intracranial abnormality identified. 2. Small chronic parafalcine meningioma size is stable since that described in 2006 (12 x 15 mm). 3. Advanced signal changes in the cerebral white matter and to a lesser extent pons, nonspecific but most commonly due to chronic small vessel disease. 4. Partially visible degenerative cervical spinal stenosis.     01/07/2019 -  Chemotherapy   The patient had  carboplatin and Alimta for chemotherapy treatment.     04/14/2019 PET scan   1. 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 As shown on recent MRI of 04/01/2019, there is a new component of invasion into the left lower L3 vertebral body. This new component has a maximum SUV of 10.0, compatible with active malignancy. 2. Interval wedge resection of portions of the right upper lobe and right lower lobe at the site of the prior nodule. There is only minimal low-grade activity along the wedge resection clips, maximum SUV of 1.5. Surveillance is likely warranted. No new nodule identified. 3. Other imaging findings of potential clinical significance: Aortic Atherosclerosis (ICD10-I70.0). Coronary atherosclerosis. Thoracolumbar scoliosis. Cholelithiasis.  Sigmoid colon diverticulosis.      Procedure       REVIEW OF SYSTEMS:   Constitutional: Denies fevers, chills or abnormal weight loss Eyes: Denies blurriness of vision Ears, nose, mouth, throat, and face: Denies mucositis or sore throat Respiratory: Denies cough, dyspnea or wheezes Cardiovascular: Denies palpitation, chest discomfort Gastrointestinal:  Denies nausea, heartburn or change in bowel habits Skin: Denies abnormal skin rashes Lymphatics: Denies new lymphadenopathy or easy bruising Neurological:Denies numbness, tingling or new weaknesses Behavioral/Psych: Mood is stable, no new changes  Extremities: No lower extremity edema All other systems were reviewed with the patient and are negative.  I have reviewed the past medical history, past surgical history, social history and family history with the patient and they are unchanged from previous note.  ALLERGIES:  is allergic to ace inhibitors; dilaudid [hydromorphone hcl]; codeine; and demerol [meperidine hcl].  MEDICATIONS:  Current Outpatient Medications  Medication Sig Dispense Refill   amLODipine (NORVASC) 5 MG tablet Take 5 mg by mouth daily.      atenolol (TENORMIN) 25 MG tablet Take 25 mg by mouth 2 (two) times daily.      Biotin 5000 MCG TABS Take 5,000 mcg by mouth daily.     lenvatinib 10 mg daily dose (LENVIMA, 10 MG DAILY DOSE,) capsule Take 1 capsule (10 mg total) by mouth daily. 30 capsule 11   lidocaine-prilocaine (EMLA) cream Apply to [facility] cath 1-2 hours prior to access as directed 30 g 1   LORazepam (ATIVAN) 0.5 MG tablet 1 tablet po 30 minutes prior to radiation or MRI 30 tablet 0   Magnesium 250 MG TABS Take 250 mg by mouth daily.      Multiple Vitamin (MULTIVITAMIN WITH MINERALS) TABS tablet Take 1 tablet by mouth daily.     potassium gluconate 595 MG TABS tablet Take 595 mg by mouth daily.      simvastatin (ZOCOR) 20 MG tablet Take 20 mg by mouth every evening.      No current facility-administered medications for this visit.    Facility-Administered Medications Ordered in Other Visits  Medication Dose Route Frequency Provider Last Rate Last Dose   heparin lock flush 100 unit/mL  500 Units Intracatheter Once Alvy Bimler, Shiro Ellerman, MD       sodium chloride flush (NS) 0.9 % injection 10 mL  10 mL Intracatheter Once Heath Lark, MD        PHYSICAL EXAMINATION: ECOG PERFORMANCE STATUS: 1 - Symptomatic but completely ambulatory  LABORATORY DATA:  I have reviewed the data as listed CMP Latest Ref Rng & Units 03/23/2019 03/15/2019 02/26/2019  Glucose 70 - 99 mg/dL 114(H) 79 87  BUN 8 - 23 mg/dL _0 Creatinine 0.44 - 1.00 mg/dL 0.85 0.86 0.80  Sodium 135 - 145 mmol/L 140 142 141  Potassium 3.5 - 5.1 mmol/L 4.2 4.0 4.1  Chloride 98 - 111 mmol/L 106 109 106  CO2 22 - 32 mmol/L _1 Calcium 8.9 - 10.3 mg/dL 8.7(L) 9.4 9.1  Total Protein 6.5 - 8.1 g/dL 6.4(L) 6.9 6.6  Total Bilirubin 0.3 - 1.2 mg/dL 0.3 0.5 0.7  Alkaline Phos 38 - 126 U/L 52 52 52  AST 15 - 41 U/L _2 ALT 0 - 44 U/L _3 Lab Results  Component Value Date   WBC 2.2 (L) 03/23/2019   HGB 8.4 (L) 03/23/2019   HCT 25.5 (L)  03/23/2019   MCV 97.0 03/23/2019   PLT 94 (  L) 03/23/2019   NEUTROABS 1.1 (L) 03/23/2019     RADIOGRAPHIC STUDIES: I have personally reviewed the radiological images as listed and agreed with the findings in the report. Dg Chest 2 View  Result Date: 03/23/2019 CLINICAL DATA:  Shortness of breath, history of lung cancer. EXAM: CHEST - 2 VIEW COMPARISON:  February 26, 2019. FINDINGS: The heart size and mediastinal contours are within normal limits. No pneumothorax or pleural effusion is noted. Surgical clip seen in right axillary region. Scarring and postoperative changes noted in the right midlung. No acute abnormality is noted. The visualized skeletal structures are unremarkable. IMPRESSION: No active cardiopulmonary disease. Electronically Signed   By: Marijo Conception M.D.   On: 03/23/2019 13:48   Mr Lumbar Spine W Wo Contrast  Result Date: 04/01/2019 CLINICAL DATA:  Follow-up left paravertebral mass at L3. Status post RT. EXAM: MRI LUMBAR SPINE WITHOUT AND WITH CONTRAST TECHNIQUE: Multiplanar and multiecho pulse sequences of the lumbar spine were obtained without and with intravenous contrast. CONTRAST:  96m GADAVIST GADOBUTROL 1 MMOL/ML IV SOLN COMPARISON:  MRI lumbar spine 10/15/2018 and PET-CT 11/11/2018 FINDINGS: Segmentation: There are five lumbar type vertebral bodies. The last full intervertebral disc space is labeled L5-S1. This correlates with the prior MRI. Alignment: Stable scoliosis and advanced degenerative lumbar spondylosis. Stable anterolisthesis of L4. Vertebrae: New fairly extensive heterogeneous signal abnormality in the L3 vertebral body and subsequent contrast enhancement worrisome for tumor. Radiation necrosis could be possible but the pattern is not typical for that entity. The other vertebral bodies are unremarkable. I do not see any evidence for osseous metastatic disease elsewhere. Conus medullaris and cauda equina: Conus extends to the L1-2 level. Conus and cauda equina  appear normal. Paraspinal and other soft tissues: The left paraspinal mass at L3-4 is relatively stable in size. It measures approximately 22 x 16.5 mm on image 20/6 and previously measured 24.5 x 17.5 mm. There is rim enhancement around a more cystic or necrotic appearing area. There is also persistent abnormal increased T2 signal intensity and enhancement in the left psoas muscle. No obvious retroperitoneal adenopathy. Disc levels: Stable advanced degenerative disc disease and facet disease at L1-2, L2-3, L4-5 and L5-S1. Persistent spinal, left lateral recess and left foraminal stenosis at L4-5. L3-4: Persistent broad-based disc protrusion with mass effect on the left side of the thecal sac, significant left lateral recess stenosis and moderate left foraminal stenosis. The left L3 nerve root is likely compressed between the disc protrusion and the tumor. Stable severe facet disease at this level also. IMPRESSION: 1. New fairly extensive heterogeneous signal abnormality in the L3 vertebral body along with fairly marked enhancement worrisome for direct invasion of tumor. Radiation necrosis is felt to be unlikely. 2. Stable appearance of the left paraspinal tumor and abnormal left psoas muscle. 3. Scoliosis and degenerative lumbar spondylosis with multilevel disc disease and facet disease. 4. Stable spinal, left lateral recess and left foraminal stenosis at L4-5. 5. Stable appearing significant left lateral recess and foraminal stenosis at L3-4. Electronically Signed   By: PMarijo SanesM.D.   On: 04/01/2019 16:17   Nm Pet Image Restag (ps) Skull Base To Thigh  Result Date: 04/14/2019 CLINICAL DATA:  Subsequent treatment strategy for non-small cell lung cancer. EXAM: NUCLEAR MEDICINE PET SKULL BASE TO THIGH TECHNIQUE: 5.6 mCi F-18 FDG was injected intravenously. Full-ring PET imaging was performed from the skull base to thigh after the radiotracer. CT data was obtained and used for attenuation correction and  anatomic localization.  Fasting blood glucose: 87 mg/dl COMPARISON:  Multiple exams, including 11/11/2018 FINDINGS: Mediastinal blood pool activity: SUV max 2.1 Liver activity: SUV max NA NECK: No significant abnormal hypermetabolic activity in this region. Incidental CT findings: Bilateral common carotid atherosclerotic calcification. CHEST: Wedge resection clips at the site of the prior superior segment right lower lobe pulmonary nodule spanning the major fissure, maximum SUV associated with the scarring along the wedge resection clips is 1.5. No hypermetabolic adenopathy in the chest. Stable mild scarring in the left lower lobe. Incidental CT findings: Left Port-A-Cath tip: SVC. Coronary, aortic arch, and branch vessel atherosclerotic vascular disease. Right axillary clips noted. ABDOMEN/PELVIS: Bowel activity, likely physiologic. Incidental CT findings: Aortoiliac atherosclerotic vascular disease. Cholelithiasis with multiple gallstones observed in the gallbladder. Sigmoid colon diverticulosis. SKELETON: The left eccentric at the L3-4 level is probably invading into the left side of the left L3 vertebral body. The vertebral component of tumor has a maximum SUV of 10.0. The paired vertebral component has maximum SUV of 7.9 (formerly 8.0). Incidental CT findings: Degenerative anterolisthesis at L4-5. Thoracolumbar scoliosis. IMPRESSION: 1. 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 As shown on recent MRI of 04/01/2019, there is a new component of invasion into the left lower L3 vertebral body. This new component has a maximum SUV of 10.0, compatible with active malignancy. 2. Interval wedge resection of portions of the right upper lobe and right lower lobe at the site of the prior nodule. There is only minimal low-grade activity along the wedge resection clips, maximum SUV of 1.5. Surveillance is likely warranted. No new nodule identified. 3. Other imaging findings of potential clinical  significance: Aortic Atherosclerosis (ICD10-I70.0). Coronary atherosclerosis. Thoracolumbar scoliosis. Cholelithiasis. Sigmoid colon diverticulosis. Electronically Signed   By: Van Clines M.D.   On: 04/14/2019 13:24    I discussed the assessment and treatment plan with the patient. The patient was provided an opportunity to ask questions and all were answered. The patient agreed with the plan and demonstrated an understanding of the instructions. The patient was advised to call back or seek an in-person evaluation if the symptoms worsen or if the condition fails to improve as anticipated.    I spent 31 minutes counseling the patient. The total time spent in the appointment was 55 minutes and more than 50% was on counseling and review of test results  Heath Lark, MD 04/20/2019 9:02 AM

## 2019-04-20 NOTE — Telephone Encounter (Signed)
Oral Oncology Patient Advocate Encounter  Received notification from [facility] that prior authorization for Lenvima is required.  PA submitted on CoverMyMeds Key [id number] Status is pending  Oral Oncology Clinic will continue to follow.  [place] [facility] Phone [phone] Fax [phone] 04/20/2019 8:50 AM

## 2019-04-20 NOTE — Assessment & Plan Note (Signed)
She has completed primary surgery and chemotherapy She will not complete adjuvant radiation therapy due to her cancer recurrence I am hopeful, pembrolizumab will also help reduce risk of cancer recurrence from the lung standpoint

## 2019-04-20 NOTE — Telephone Encounter (Signed)
Scheduled appt per 12/7 sch message - unable to reach pt . Left message with appt date and time

## 2019-04-20 NOTE — Assessment & Plan Note (Signed)
We had previous goals of care discussion in the past She understood that treatment goal is strictly palliative in nature

## 2019-04-21 ENCOUNTER — Encounter: Payer: Self-pay | Admitting: *Deleted

## 2019-04-23 ENCOUNTER — Telehealth: Payer: Self-pay | Admitting: Pharmacist

## 2019-04-23 NOTE — Telephone Encounter (Signed)
Oral Chemotherapy Pharmacist Encounter   Due to a high copay for Lenvima, Ms. Pocius will need to be signed up for manufacturer assistance to obtain free medication. She will need to come in and sign the assistance application and bring in her income documents. Called patient, no answer, LVM for the patient to call me back.   Darl Pikes, PharmD, BCPS, [facility] Hematology/Oncology Clinical Pharmacist [facility] [phone]  04/23/2019 10:40 AM

## 2019-04-27 ENCOUNTER — Other Ambulatory Visit: Payer: Self-pay

## 2019-05-03 ENCOUNTER — Inpatient Hospital Stay: Payer: Medicare Other

## 2019-05-03 ENCOUNTER — Telehealth: Payer: Self-pay

## 2019-05-03 ENCOUNTER — Other Ambulatory Visit: Payer: Self-pay

## 2019-05-03 VITALS — BP 157/74 | HR 63 | Temp 98.7°F | Resp 18

## 2019-05-03 DIAGNOSIS — C549 Malignant neoplasm of corpus uteri, unspecified: Secondary | ICD-10-CM | POA: Diagnosis not present

## 2019-05-03 DIAGNOSIS — Z95828 Presence of other vascular implants and grafts: Secondary | ICD-10-CM

## 2019-05-03 DIAGNOSIS — Z7189 Other specified counseling: Secondary | ICD-10-CM

## 2019-05-03 DIAGNOSIS — C7951 Secondary malignant neoplasm of bone: Secondary | ICD-10-CM

## 2019-05-03 DIAGNOSIS — Z853 Personal history of malignant neoplasm of breast: Secondary | ICD-10-CM

## 2019-05-03 DIAGNOSIS — C541 Malignant neoplasm of endometrium: Secondary | ICD-10-CM

## 2019-05-03 DIAGNOSIS — D701 Agranulocytosis secondary to cancer chemotherapy: Secondary | ICD-10-CM

## 2019-05-03 DIAGNOSIS — T451X5A Adverse effect of antineoplastic and immunosuppressive drugs, initial encounter: Secondary | ICD-10-CM

## 2019-05-03 LAB — CBC WITH DIFFERENTIAL (CANCER CENTER ONLY)
Abs Immature Granulocytes: 0.02 10*3/uL (ref 0.00–0.07)
Basophils Absolute: 0.1 10*3/uL (ref 0.0–0.1)
Basophils Relative: 1 %
Eosinophils Absolute: 0.2 10*3/uL (ref 0.0–0.5)
Eosinophils Relative: 3 %
HCT: 32.8 % — ABNORMAL LOW (ref 36.0–46.0)
Hemoglobin: 10.5 g/dL — ABNORMAL LOW (ref 12.0–15.0)
Immature Granulocytes: 0 %
Lymphocytes Relative: 19 %
Lymphs Abs: 1.2 10*3/uL (ref 0.7–4.0)
MCH: 33.2 pg (ref 26.0–34.0)
MCHC: 32 g/dL (ref 30.0–36.0)
MCV: 103.8 fL — ABNORMAL HIGH (ref 80.0–100.0)
Monocytes Absolute: 1 10*3/uL (ref 0.1–1.0)
Monocytes Relative: 16 %
Neutro Abs: 4 10*3/uL (ref 1.7–7.7)
Neutrophils Relative %: 61 %
Platelet Count: 173 10*3/uL (ref 150–400)
RBC: 3.16 MIL/uL — ABNORMAL LOW (ref 3.87–5.11)
RDW: 16.5 % — ABNORMAL HIGH (ref 11.5–15.5)
WBC Count: 6.4 10*3/uL (ref 4.0–10.5)
nRBC: 0 % (ref 0.0–0.2)

## 2019-05-03 LAB — CMP (CANCER CENTER ONLY)
ALT: 19 U/L (ref 0–44)
AST: 33 U/L (ref 15–41)
Albumin: 3.9 g/dL (ref 3.5–5.0)
Alkaline Phosphatase: 45 U/L (ref 38–126)
Anion gap: 11 (ref 5–15)
BUN: 22 mg/dL (ref 8–23)
CO2: 25 mmol/L (ref 22–32)
Calcium: 9.5 mg/dL (ref 8.9–10.3)
Chloride: 109 mmol/L (ref 98–111)
Creatinine: 0.8 mg/dL (ref 0.44–1.00)
GFR, Est AFR Am: >60 mL/min (ref >60)
GFR, Estimated: >60 mL/min (ref >60)
Glucose, Bld: 87 mg/dL (ref 70–99)
Potassium: 3.9 mmol/L (ref 3.5–5.1)
Sodium: 145 mmol/L (ref 135–145)
Total Bilirubin: 0.7 mg/dL (ref 0.3–1.2)
Total Protein: 7.2 g/dL (ref 6.5–8.1)

## 2019-05-03 LAB — TOTAL PROTEIN, URINE DIPSTICK: Protein, ur: <30 mg/dL — AB

## 2019-05-03 LAB — TSH: TSH: 1.201 u[IU]/mL (ref 0.308–3.960)

## 2019-05-03 MED ORDER — SODIUM CHLORIDE 0.9 % IV SOLN
200.0000 mg | Freq: Once | INTRAVENOUS | Status: AC
  Administered 2019-05-03: 200 mg via INTRAVENOUS
  Filled 2019-05-03: qty 8

## 2019-05-03 MED ORDER — HEPARIN SOD (PORK) LOCK FLUSH 100 UNIT/ML IV SOLN
500.0000 [IU] | Freq: Once | INTRAVENOUS | Status: AC | PRN
  Administered 2019-05-03: 500 [IU]
  Filled 2019-05-03: qty 5

## 2019-05-03 MED ORDER — SODIUM CHLORIDE 0.9% FLUSH
10.0000 mL | Freq: Once | INTRAVENOUS | Status: AC
  Administered 2019-05-03: 10 mL
  Filled 2019-05-03: qty 10

## 2019-05-03 MED ORDER — SODIUM CHLORIDE 0.9 % IV SOLN
Freq: Once | INTRAVENOUS | Status: AC
  Filled 2019-05-03: qty 250

## 2019-05-03 MED ORDER — SODIUM CHLORIDE 0.9% FLUSH
10.0000 mL | INTRAVENOUS | Status: DC | PRN
  Administered 2019-05-03: 16:00:00 10 mL
  Filled 2019-05-03: qty 10

## 2019-05-03 NOTE — Patient Instructions (Addendum)
[place] Discharge Instructions for Patients Receiving Chemotherapy  Today you received the following agents Pembrolizumab [facility])    If you develop nausea and vomiting that is not controlled by your nausea medication, call the clinic.   BELOW ARE SYMPTOMS THAT SHOULD BE REPORTED IMMEDIATELY:  *FEVER GREATER THAN 100.5 F  *CHILLS WITH OR WITHOUT FEVER  NAUSEA AND VOMITING THAT IS NOT CONTROLLED WITH YOUR NAUSEA MEDICATION  *UNUSUAL SHORTNESS OF BREATH  *UNUSUAL BRUISING OR BLEEDING  TENDERNESS IN MOUTH AND THROAT WITH OR WITHOUT PRESENCE OF ULCERS  *URINARY PROBLEMS  *BOWEL PROBLEMS  UNUSUAL RASH Items with * indicate a potential emergency and should be followed up as soon as possible.  Feel free to call the clinic should you have any questions or concerns. The clinic phone number is [phone].  Please show the [place] at check-in to the Emergency Department and triage nurse.  Pembrolizumab injection What is this medicine? PEMBROLIZUMAB (pem broe liz ue mab) is a monoclonal antibody. It is used to treat bladder cancer, cervical cancer, endometrial cancer, esophageal cancer, head and neck cancer, hepatocellular cancer, Hodgkin lymphoma, kidney cancer, lymphoma, melanoma, Merkel cell carcinoma, lung cancer, stomach cancer, urothelial cancer, and cancers that have a certain genetic condition. This medicine may be used for other purposes; ask your health care provider or pharmacist if you have questions. COMMON BRAND NAME(S): Keytruda What should I tell my health care provider before I take this medicine? They need to know if you have any of these conditions:  diabetes  immune system problems  inflammatory bowel disease  liver disease  lung or breathing disease  lupus  received or scheduled to receive an organ transplant or a stem-cell transplant that uses donor stem cells  an unusual or allergic reaction to pembrolizumab, other  medicines, foods, dyes, or preservatives  pregnant or trying to get pregnant  breast-feeding How should I use this medicine? This medicine is for infusion into a vein. It is given by a health care professional in a hospital or clinic setting. A special MedGuide will be given to you before each treatment. Be sure to read this information carefully each time. Talk to your pediatrician regarding the use of this medicine in children. While this drug may be prescribed for selected conditions, precautions do apply. Overdosage: If you think you have taken too much of this medicine contact a poison control center or emergency room at once. NOTE: This medicine is only for you. Do not share this medicine with others. What if I miss a dose? It is important not to miss your dose. Call your doctor or health care professional if you are unable to keep an appointment. What may interact with this medicine? Interactions have not been studied. Give your health care provider a list of all the medicines, herbs, non-prescription drugs, or dietary supplements you use. Also tell them if you smoke, drink alcohol, or use illegal drugs. Some items may interact with your medicine. This list may not describe all possible interactions. Give your health care provider a list of all the medicines, herbs, non-prescription drugs, or dietary supplements you use. Also tell them if you smoke, drink alcohol, or use illegal drugs. Some items may interact with your medicine. What should I watch for while using this medicine? Your condition will be monitored carefully while you are receiving this medicine. You may need blood work done while you are taking this medicine. Do not become pregnant while taking this medicine or for  4 months after stopping it. Women should inform their doctor if they wish to become pregnant or think they might be pregnant. There is a potential for serious side effects to an unborn child. Talk to your health  care professional or pharmacist for more information. Do not breast-feed an infant while taking this medicine or for 4 months after the last dose. What side effects may I notice from receiving this medicine? Side effects that you should report to your doctor or health care professional as soon as possible:  allergic reactions like skin rash, itching or hives, swelling of the face, lips, or tongue  bloody or black, tarry  breathing problems  changes in vision  chest pain  chills  confusion  constipation  cough  diarrhea  dizziness or feeling faint or lightheaded  fast or irregular heartbeat  fever  flushing  hair loss  joint pain  low blood counts - this medicine may decrease the number of white blood cells, red blood cells and platelets. You may be at increased risk for infections and bleeding.  muscle pain  muscle weakness  persistent headache  redness, blistering, peeling or loosening of the skin, including inside the mouth  signs and symptoms of high blood sugar such as dizziness; dry mouth; dry skin; fruity breath; nausea; stomach pain; increased hunger or thirst; increased urination  signs and symptoms of kidney injury like trouble passing urine or change in the amount of urine  signs and symptoms of liver injury like dark urine, light-colored stools, loss of appetite, nausea, right upper belly pain, yellowing of the eyes or skin  sweating  swollen lymph nodes  weight loss Side effects that usually do not require medical attention (report to your doctor or health care professional if they continue or are bothersome):  decreased appetite  muscle pain  tiredness This list may not describe all possible side effects. Call your doctor for medical advice about side effects. You may report side effects to FDA at 1-800-FDA-1088. Where should I keep my medicine? This drug is given in a hospital or clinic and will not be stored at home. NOTE: This sheet  is a summary. It may not cover all possible information. If you have questions about this medicine, talk to your doctor, pharmacist, or health care provider.  2020 Elsevier/Gold Standard (2018-05-26 13:46:58) Coronavirus (COVID-19) Are you at risk?  Are you at risk for the Coronavirus (COVID-19)?  To be considered HIGH RISK for Coronavirus (COVID-19), you have to meet the following criteria:  . Traveled to [place], [place], [place], [place] or [place]; or in the [place] to [place], [place], [place], or [place]; and have fever, cough, and shortness of breath within the last 2 weeks of travel OR . Been in close contact with a person diagnosed with COVID-19 within the last 2 weeks and have fever, cough, and shortness of breath . IF YOU DO NOT MEET THESE CRITERIA, YOU ARE CONSIDERED LOW RISK FOR COVID-19.  What to do if you are HIGH RISK for COVID-19?  Marland Kitchen If you are having a medical emergency, call 911. . Seek medical care right away. Before you go to a doctor's office, urgent care or emergency department, call ahead and tell them about your recent travel, contact with someone diagnosed with COVID-19, and your symptoms. You should receive instructions from your physician's office regarding next steps of care.  . When you arrive at healthcare provider, tell the healthcare staff immediately you have returned from visiting  [place], [place], [place], [place] or [place]; or traveled in the [place] to [place], [place], [place], or [place]; in the last two weeks or you have been in close contact with a person diagnosed with COVID-19 in the last 2 weeks.   . Tell the health care staff about your symptoms: fever, cough and shortness of breath. . After you have been seen by a medical provider, you will be either: o Tested for (COVID-19) and discharged home on quarantine except to seek medical care if symptoms worsen, and asked to  - Stay home and avoid contact with others until you get  your results (4-5 days)  - Avoid travel on public transportation if possible (such as bus, train, or airplane) or o Sent to the Emergency Department by EMS for evaluation, COVID-19 testing, and possible admission depending on your condition and test results.  What to do if you are LOW RISK for COVID-19?  Reduce your risk of any infection by using the same precautions used for avoiding the common cold or flu:  Marland Kitchen Wash your hands often with soap and warm water for at least 20 seconds.  If soap and water are not readily available, use an alcohol-based hand sanitizer with at least 60% alcohol.  . If coughing or sneezing, cover your mouth and nose by coughing or sneezing into the elbow areas of your shirt or coat, into a tissue or into your sleeve (not your hands). . Avoid shaking hands with others and consider head nods or verbal greetings only. . Avoid touching your eyes, nose, or mouth with unwashed hands.  . Avoid close contact with people who are sick. . Avoid places or events with large numbers of people in one location, like concerts or sporting events. . Carefully consider travel plans you have or are making. . If you are planning any travel outside or inside the [place], visit the CDC's Travelers' Health webpage for the latest health notices. . If you have some symptoms but not all symptoms, continue to monitor at home and seek medical attention if your symptoms worsen. . If you are having a medical emergency, call 911.   [place] / e-Visit: [email or web address]         [facility]: [facility]: [phone]                   [facility]: [phone]

## 2019-05-03 NOTE — Telephone Encounter (Signed)
Met patient in lobby to sign Armed forces logistics/support/administrative officer for [employer] Also received financial documents from patient.  Faxed completed application and financial documents to [facility].  [place] phone: [phone].  [facility] [place] Phone [phone] Fax [phone] 05/03/2019 1:02 PM

## 2019-05-03 NOTE — Patient Instructions (Signed)

## 2019-05-04 ENCOUNTER — Telehealth: Payer: Self-pay

## 2019-05-04 ENCOUNTER — Telehealth: Payer: Self-pay | Admitting: Pharmacist

## 2019-05-04 NOTE — Telephone Encounter (Signed)
Called to follow up after new treatment yesterday. She is doing well with no problems. Instructed to call the office if needed. She verbalized understanding.

## 2019-05-04 NOTE — Telephone Encounter (Signed)
Oral Chemotherapy Pharmacist Encounter  Provided patient with 3 week supply of Lenvima to get her started while we await manufacturer assistance approval.  Patient Education I spoke with patient in lobby ahead of her appointment for overview of new oral chemotherapy medication: Lenvima (lenvatinib) for the treatment of metastatic endometrial cancer in conjunction with pembrolizumab, planned duration until disease progression or unacceptable drug toxicity.    Counseled patient on administration, dosing, side effects, monitoring, drug-food interactions, safe handling, storage, and disposal. Patient will take 1 capsule (10 mg total) by mouth daily.  Side effects include but not limited to: HTN, diarrhea, fatigue, N/V, hand-foot syndrome.    Reviewed with patient importance of keeping a medication schedule and plan for any missed doses.  [name] voiced understanding and appreciation. All questions answered. Medication handout provided to patient.  Provided patient with [facility] phone number. Patient knows to call the office with questions or concerns. [facility] Chemotherapy [facility] will continue to follow.  Darl Pikes, PharmD, BCPS, BCOP, CPP Hematology/Oncology Clinical Pharmacist [facility]/HP/AP [facility] [phone]  05/04/2019 9:08 AM

## 2019-05-11 ENCOUNTER — Other Ambulatory Visit: Payer: Self-pay | Admitting: *Deleted

## 2019-05-11 DIAGNOSIS — C7951 Secondary malignant neoplasm of bone: Secondary | ICD-10-CM

## 2019-05-17 ENCOUNTER — Telehealth: Payer: Self-pay | Admitting: *Deleted

## 2019-05-17 NOTE — Telephone Encounter (Signed)
-----   Message from Heath Lark, MD sent at 05/17/2019  7:13 AM EST ----- Regarding: can you call and ask how is her BP doing?

## 2019-05-17 NOTE — Telephone Encounter (Signed)
Telephone call to patient to discuss blood pressure. She states she has failed to keep a log. She recalls her highest was 151/70. This was last week sometime.   Educated patient to start keeping a journal with daily pressure readings. Patient verbalized an understanding and will start doing this so she can better recall and report readings.

## 2019-05-22 ENCOUNTER — Encounter (HOSPITAL_COMMUNITY): Payer: Self-pay | Admitting: Emergency Medicine

## 2019-05-22 ENCOUNTER — Emergency Department (HOSPITAL_COMMUNITY)
Admission: EM | Admit: 2019-05-22 | Discharge: 2019-05-22 | Disposition: A | Discharge status: Home / Self Care | Payer: Medicare Other | Attending: Emergency Medicine | Admitting: Emergency Medicine

## 2019-05-22 ENCOUNTER — Other Ambulatory Visit: Payer: Self-pay

## 2019-05-22 ENCOUNTER — Emergency Department (HOSPITAL_COMMUNITY): Payer: Medicare Other

## 2019-05-22 DIAGNOSIS — Z87891 Personal history of nicotine dependence: Secondary | ICD-10-CM | POA: Insufficient documentation

## 2019-05-22 DIAGNOSIS — I10 Essential (primary) hypertension: Secondary | ICD-10-CM | POA: Diagnosis not present

## 2019-05-22 DIAGNOSIS — Z90710 Acquired absence of both cervix and uterus: Secondary | ICD-10-CM | POA: Insufficient documentation

## 2019-05-22 DIAGNOSIS — G459 Transient cerebral ischemic attack, unspecified: Secondary | ICD-10-CM | POA: Diagnosis not present

## 2019-05-22 DIAGNOSIS — R202 Paresthesia of skin: Secondary | ICD-10-CM | POA: Insufficient documentation

## 2019-05-22 DIAGNOSIS — C541 Malignant neoplasm of endometrium: Secondary | ICD-10-CM | POA: Insufficient documentation

## 2019-05-22 DIAGNOSIS — Z79899 Other long term (current) drug therapy: Secondary | ICD-10-CM | POA: Diagnosis not present

## 2019-05-22 DIAGNOSIS — T451X5A Adverse effect of antineoplastic and immunosuppressive drugs, initial encounter: Secondary | ICD-10-CM | POA: Diagnosis not present

## 2019-05-22 DIAGNOSIS — T887XXA Unspecified adverse effect of drug or medicament, initial encounter: Secondary | ICD-10-CM

## 2019-05-22 LAB — CBC WITH DIFFERENTIAL/PLATELET
Abs Immature Granulocytes: 0.05 10*3/uL (ref 0.00–0.07)
Basophils Absolute: 0 10*3/uL (ref 0.0–0.1)
Basophils Relative: 1 %
Eosinophils Absolute: 0.1 10*3/uL (ref 0.0–0.5)
Eosinophils Relative: 2 %
HCT: 35.1 % — ABNORMAL LOW (ref 36.0–46.0)
Hemoglobin: 11.8 g/dL — ABNORMAL LOW (ref 12.0–15.0)
Immature Granulocytes: 1 %
Lymphocytes Relative: 13 %
Lymphs Abs: 1.1 10*3/uL (ref 0.7–4.0)
MCH: 34.2 pg — ABNORMAL HIGH (ref 26.0–34.0)
MCHC: 33.6 g/dL (ref 30.0–36.0)
MCV: 101.7 fL — ABNORMAL HIGH (ref 80.0–100.0)
Monocytes Absolute: 1.1 10*3/uL — ABNORMAL HIGH (ref 0.1–1.0)
Monocytes Relative: 13 %
Neutro Abs: 6.1 10*3/uL (ref 1.7–7.7)
Neutrophils Relative %: 70 %
Platelets: 129 10*3/uL — ABNORMAL LOW (ref 150–400)
RBC: 3.45 MIL/uL — ABNORMAL LOW (ref 3.87–5.11)
RDW: 13.5 % (ref 11.5–15.5)
WBC: 8.5 10*3/uL (ref 4.0–10.5)
nRBC: 0 % (ref 0.0–0.2)

## 2019-05-22 LAB — RAPID URINE DRUG SCREEN, HOSP PERFORMED
Amphetamines: NOT DETECTED
Barbiturates: NOT DETECTED
Benzodiazepines: NOT DETECTED
Cocaine: NOT DETECTED
Opiates: NOT DETECTED
Tetrahydrocannabinol: NOT DETECTED

## 2019-05-22 LAB — BASIC METABOLIC PANEL
Anion gap: 13 (ref 5–15)
BUN: 23 mg/dL (ref 8–23)
CO2: 26 mmol/L (ref 22–32)
Calcium: 9.9 mg/dL (ref 8.9–10.3)
Chloride: 102 mmol/L (ref 98–111)
Creatinine, Ser: 0.85 mg/dL (ref 0.44–1.00)
GFR calc Af Amer: >60 mL/min (ref >60)
GFR calc non Af Amer: >60 mL/min (ref >60)
Glucose, Bld: 124 mg/dL — ABNORMAL HIGH (ref 70–99)
Potassium: 3.6 mmol/L (ref 3.5–5.1)
Sodium: 141 mmol/L (ref 135–145)

## 2019-05-22 LAB — CBG MONITORING, ED: Glucose-Capillary: 106 mg/dL — ABNORMAL HIGH (ref 70–99)

## 2019-05-22 MED ORDER — ASPIRIN 81 MG PO CHEW
324.0000 mg | CHEWABLE_TABLET | Freq: Once | ORAL | Status: AC
  Administered 2019-05-22: 16:00:00 324 mg via ORAL
  Filled 2019-05-22: qty 4

## 2019-05-22 NOTE — ED Provider Notes (Signed)
[facility] DEPT Provider Note   CSN: [id number] Arrival date & time: 05/22/19  1422     History Chief Complaint  Patient presents with  . Medication Reaction    [name] is a 79 y.o. female.  The history is provided by the patient and medical records. No language interpreter was used.     79 year old female with history of endometrial cancer status post total abdominal hysterectomy, hypertension, hypercholesterolemia, breast cancer status post lumpectomy and radiation therapy presenting with concern of potential side effect from recent medication.  Patient report 3 weeks ago she was started on Lenivma (lenvatinib), a cancer treatment.  Today approximately 4 hours ago she experienced sensation of tingling numbness sensation around her mouth lasting for approximately 10 minutes.  She also noticed some pain in sensation to the tip of her left hand.  No weakness.  Symptoms subsequently subside.  She then became worried about her symptoms, and call discussed with her cancer staff who was recommended to come to the ER for further evaluation.  Patient denies any associated headache, vision changes, confusion, lightheadedness, dizziness, chest pain, trouble breathing, abdominal pain, arm numbness or weakness.  She took her usual blood pressure medication this morning.  She denies any increase in stress but states that after the incident she was worried.  She mention she is back to her normal baseline.  She denies any prior history of stroke.  No cold symptoms and no recent exposure to anyone with COVID-19.  She has been taking the new medication for the past 2 weeks without any adverse event.  Denies any throat swelling, itchiness, rash, or abdominal cramping.  Past Medical History:  Diagnosis Date  . Endometrial cancer ([place]) 12/2007   s/p total abdominal hysterectomy at [facility] - ovaries were also removed   . Family history of breast cancer   . History of  breast cancer    right breast -- treated with lumpectomy and radiation therarpy, postoperatively with tamoxifen   . History of radiation therapy 10/22/2016 to 10/28/2016  . Hypercholesterolemia   . Hyperlipidemia   . Hypertension   . Palpitations   . Personal history of radiation therapy 2006  . PVC's (premature ventricular contractions)   . Radiation 07/11/14-08/12/14   left lumbar paraspinal area 55 gray  . S/P radiation therapy 10/28/11-12/05/11   5040 cGy left pelvis  . S/P radiation therapy    Intracavitary brachytherapy of uterus    Patient Active Problem List   Diagnosis Date Noted  . Goals of care, counseling/discussion 04/19/2019  . Dehydration 01/14/2019  . Pancytopenia, acquired ([place]) 01/14/2019  . Dysuria 12/29/2018  . Right lower lobe pulmonary nodule 12/07/2018  . Primary lung cancer, right ([place]) 11/04/2018  . White coat syndrome with diagnosis of hypertension 01/24/2017  . Genetic testing 10/31/2016  . History of breast cancer   . Family history of breast cancer   . Aortic atherosclerosis ([place]) 06/08/2016  . Plantar fasciitis of left foot 12/13/2015  . Anemia due to antineoplastic chemotherapy 11/14/2015  . Lactose intolerance in adult 11/14/2015  . Chemotherapy-induced neuropathy ([place]) 10/08/2015  . Dense breast tissue 10/08/2015  . Port catheter in place 09/28/2015  . Central line complication 94/17/4081  . Chemotherapy induced neutropenia ([place]) 08/22/2015  . Arterial hypotension 08/12/2015  . HX: breast cancer 02/07/2015  . Metastatic cancer to bone ([place]) 06/27/2014  . Hypotension 06/27/2011  . Endometrial cancer ([place]) 05/15/2011  . Hypercholesterolemia 12/28/2010  . Palpitations 12/28/2010    Past  Surgical History:  Procedure Laterality Date  . APPENDECTOMY    . BREAST LUMPECTOMY Right 2000  . COLONOSCOPY     3X  . FOOT SURGERY     RIGHT  . SEGMENTECOMY Right 12/07/2018   Procedure: RIGHT LOWER LOBE SUPERIOR SEGMENTECTOMY;  Surgeon: Melrose Nakayama, MD;  Location: [facility];  Service: Thoracic;  Laterality: Right;  . TONSILLECTOMY    . TOTAL ABDOMINAL HYSTERECTOMY W/ BILATERAL SALPINGOOPHORECTOMY    . VIDEO ASSISTED THORACOSCOPY Right 12/07/2018   Procedure: VIDEO ASSISTED THORACOSCOPY;  Surgeon: Melrose Nakayama, MD;  Location: [facility];  Service: Thoracic;  Laterality: Right;     OB History   No obstetric history on file.     Family History  Problem Relation Age of Onset  . Thrombosis Father        coronary thrombosis  . Hypertension Father   . Heart failure Mother   . Coronary artery disease Brother   . Breast cancer Maternal Aunt        dx in her 57s  . Stroke Maternal Uncle   . Stroke Maternal Grandfather     Social History   Tobacco Use  . Smoking status: Former Smoker    Types: Cigarettes    Quit date: 05/13/1962    Years since quitting: 57.0  . Smokeless tobacco: Never Used  Substance Use Topics  . Alcohol use: Yes    Comment: occasionally  . Drug use: No    Home Medications Prior to Admission medications   Medication Sig Start Date End Date Taking? Authorizing Provider  amLODipine (NORVASC) 5 MG tablet Take 5 mg by mouth daily.    [provider]  atenolol (TENORMIN) 25 MG tablet Take 25 mg by mouth 2 (two) times daily.  01/08/11   Darlin Coco, MD  Biotin 5000 MCG TABS Take 5,000 mcg by mouth daily.    [provider]  lenvatinib 10 mg daily dose (LENVIMA, 10 MG DAILY DOSE,) capsule Take 1 capsule (10 mg total) by mouth daily. 04/19/19   Heath Lark, MD  lidocaine-prilocaine (EMLA) cream Apply to [facility] cath 1-2 hours prior to access as directed 09/12/15   Gordy Levan, MD  LORazepam (ATIVAN) 0.5 MG tablet 1 tablet po 30 minutes prior to radiation or MRI 10/08/16   Hayden Pedro, PA-C  Magnesium 250 MG TABS Take 250 mg by mouth daily.     [provider]  Multiple Vitamin (MULTIVITAMIN WITH MINERALS) TABS tablet Take 1 tablet by mouth daily.    [provider]  potassium gluconate 595 MG TABS tablet Take 595 mg by mouth daily.     [provider]  simvastatin (ZOCOR) 20 MG tablet Take 20 mg by mouth every evening.     [provider]    Allergies    Ace inhibitors, Dilaudid [hydromorphone hcl], Codeine, and Demerol [meperidine hcl]  Review of Systems   Review of Systems  All other systems reviewed and are negative.   Physical Exam Updated Vital Signs BP (!) 197/100 (BP Location: Right Arm)   Pulse 88   Temp 98.8 F (37.1 C) (Oral)   Resp 15   SpO2 97%   Physical Exam Vitals and nursing note reviewed.  Constitutional:      General: She is not in acute distress.    Appearance: She is well-developed.  HENT:     Head: Atraumatic.  Eyes:     Extraocular Movements: Extraocular movements intact.  Conjunctiva/sclera: Conjunctivae normal.     Pupils: Pupils are equal, round, and reactive to light.  Cardiovascular:     Rate and Rhythm: Normal rate and regular rhythm.     Pulses: Normal pulses.     Heart sounds: Normal heart sounds.  Pulmonary:     Effort: Pulmonary effort is normal.     Breath sounds: Normal breath sounds.  Abdominal:     Palpations: Abdomen is soft.     Tenderness: There is no abdominal tenderness.  Musculoskeletal:     Cervical back: Neck supple.  Skin:    Findings: No rash.  Neurological:     Mental Status: She is alert and oriented to person, place, and time.     GCS: GCS eye subscore is 4. GCS verbal subscore is 5. GCS motor subscore is 6.     Cranial Nerves: Cranial nerves are intact.     Comments: Neurologic exam:  Speech clear, pupils equal round reactive to light, extraocular movements intact  Normal peripheral visual fields Cranial nerves III through XII normal including no facial droop Follows commands, moves all extremities x4, normal strength to bilateral upper and lower extremities at all major muscle groups including grip Sensation normal to light touch    Coordination intact, no limb ataxia, finger-nose-finger normal Rapid alternating movements normal No pronator drift Gait normal   Psychiatric:        Mood and Affect: Mood normal.     ED Results / Procedures / Treatments   Labs (all labs ordered are listed, but only abnormal results are displayed) Labs Reviewed  BASIC METABOLIC PANEL - Abnormal; Notable for the following components:      Result Value   Glucose, Bld 124 (*)    All other components within normal limits  CBC WITH DIFFERENTIAL/PLATELET - Abnormal; Notable for the following components:   RBC 3.45 (*)    Hemoglobin 11.8 (*)    HCT 35.1 (*)    MCV 101.7 (*)    MCH 34.2 (*)    Platelets 129 (*)    Monocytes Absolute 1.1 (*)    All other components within normal limits  CBG MONITORING, ED - Abnormal; Notable for the following components:   Glucose-Capillary 106 (*)    All other components within normal limits  RAPID URINE DRUG SCREEN, HOSP PERFORMED    EKG None  Radiology CT HEAD WO CONTRAST  Result Date: 05/22/2019 CLINICAL DATA:  TIA, lip numbness EXAM: CT HEAD WITHOUT CONTRAST TECHNIQUE: Contiguous axial images were obtained from the base of the skull through the vertex without intravenous contrast. COMPARISON:  MRI 01/05/2019 FINDINGS: Brain: There is atrophy and chronic small vessel disease changes. No acute intracranial abnormality. Specifically, no hemorrhage, hydrocephalus, mass lesion, acute infarction, or significant intracranial injury. Vascular: No hyperdense vessel or unexpected calcification. Skull: No acute calvarial abnormality. Sinuses/Orbits: No acute finding Other: None IMPRESSION: Atrophy, chronic microvascular disease. No acute intracranial abnormality. Electronically Signed   By: Rolm Baptise M.D.   On: 05/22/2019 16:32    Procedures Procedures (including critical care time)  Medications Ordered in ED Medications  aspirin chewable tablet 324 mg (324 mg Oral Given 05/22/19 1554)    ED  Course  I have reviewed the triage vital signs and the nursing notes.  Pertinent labs & imaging results that were available during my care of the patient were reviewed by me and considered in my medical decision making (see chart for details).    MDM Rules/Calculators/A&P  BP (!) 183/85   Pulse 79   Temp 98.8 F (37.1 C) (Oral)   Resp 17   SpO2 100%   Final Clinical Impression(s) / ED Diagnoses Final diagnoses:  Medication side effect    Rx / DC Orders ED Discharge Orders    None     3:44 PM Patient developed tingling numbness sensation around her lips, right side of lip, and left hand for approximately 10 minutes earlier today that has since resolved.  She voiced concern for potential adverse reaction from her new cancer medication Lenivma.  Patient was found to be hypertensive with an initial blood pressure of 197/100.  Elevated blood pressure may be a side effect from the medication.  Another concern is potential stroke as this medication does increased risk of blood clot.    6:03 PM Head CT scan unremarkable, labs are reassuring, I discussed care with Dr. Sedonia Small who felt sxs not suggestive of acute stroke.  I discuss finding and plan with patient.  Through our shared decision making, we opted not to obtain brain MRI at this time.  Pt will f/u with her cancer doctor in 2 days.  She should continue with the cancer medication.  Doubt allergic reaction. BP did improves to 494W systolic without treatment.  Pt was given ASA 325mg .     Domenic Moras, PA-C 05/22/19 1808    Maudie Flakes, MD 05/23/19 [phone]

## 2019-05-22 NOTE — ED Triage Notes (Signed)
Pt reports that 3 weeks ago started [place]. Today when took it around 1030am, had numbness around her mouth and BP went up. Pt states that took her HTN meds and numbness lasted about 10-15 minutes. Pt having trouble thinking clearly in triage and is anxious. Pt states that her cancer started as uterine and has spread.

## 2019-05-22 NOTE — ED Notes (Signed)
Pt verbalizes understanding of DC instructions. Pt belongings returned and is ambulatory out of ED.  

## 2019-05-22 NOTE — Discharge Instructions (Signed)
Please continue taking your new cancer medication and follow up with your oncologist in 2 days for reassessment.  If you develop worsening symptoms do not hesitate to return to the ER for further evaluation.

## 2019-05-24 ENCOUNTER — Inpatient Hospital Stay: Payer: Medicare Other | Attending: Hematology and Oncology

## 2019-05-24 ENCOUNTER — Inpatient Hospital Stay (HOSPITAL_BASED_OUTPATIENT_CLINIC_OR_DEPARTMENT_OTHER): Payer: Medicare Other | Admitting: Hematology and Oncology

## 2019-05-24 ENCOUNTER — Inpatient Hospital Stay: Payer: Medicare Other

## 2019-05-24 ENCOUNTER — Telehealth: Payer: Self-pay | Admitting: Hematology and Oncology

## 2019-05-24 ENCOUNTER — Other Ambulatory Visit: Payer: Self-pay

## 2019-05-24 DIAGNOSIS — Z8673 Personal history of transient ischemic attack (TIA), and cerebral infarction without residual deficits: Secondary | ICD-10-CM | POA: Diagnosis not present

## 2019-05-24 DIAGNOSIS — R200 Anesthesia of skin: Secondary | ICD-10-CM | POA: Insufficient documentation

## 2019-05-24 DIAGNOSIS — C7951 Secondary malignant neoplasm of bone: Secondary | ICD-10-CM | POA: Insufficient documentation

## 2019-05-24 DIAGNOSIS — I6782 Cerebral ischemia: Secondary | ICD-10-CM | POA: Diagnosis not present

## 2019-05-24 DIAGNOSIS — C3431 Malignant neoplasm of lower lobe, right bronchus or lung: Secondary | ICD-10-CM | POA: Insufficient documentation

## 2019-05-24 DIAGNOSIS — Z885 Allergy status to narcotic agent status: Secondary | ICD-10-CM | POA: Diagnosis not present

## 2019-05-24 DIAGNOSIS — I2510 Atherosclerotic heart disease of native coronary artery without angina pectoris: Secondary | ICD-10-CM | POA: Diagnosis not present

## 2019-05-24 DIAGNOSIS — Z7189 Other specified counseling: Secondary | ICD-10-CM

## 2019-05-24 DIAGNOSIS — D61818 Other pancytopenia: Secondary | ICD-10-CM | POA: Insufficient documentation

## 2019-05-24 DIAGNOSIS — C541 Malignant neoplasm of endometrium: Secondary | ICD-10-CM | POA: Diagnosis not present

## 2019-05-24 DIAGNOSIS — I700 Atherosclerosis of aorta: Secondary | ICD-10-CM | POA: Insufficient documentation

## 2019-05-24 DIAGNOSIS — Z5112 Encounter for antineoplastic immunotherapy: Secondary | ICD-10-CM | POA: Diagnosis not present

## 2019-05-24 DIAGNOSIS — G459 Transient cerebral ischemic attack, unspecified: Secondary | ICD-10-CM

## 2019-05-24 DIAGNOSIS — I959 Hypotension, unspecified: Secondary | ICD-10-CM | POA: Diagnosis not present

## 2019-05-24 DIAGNOSIS — J320 Chronic maxillary sinusitis: Secondary | ICD-10-CM | POA: Insufficient documentation

## 2019-05-24 DIAGNOSIS — Z923 Personal history of irradiation: Secondary | ICD-10-CM | POA: Diagnosis not present

## 2019-05-24 DIAGNOSIS — I10 Essential (primary) hypertension: Secondary | ICD-10-CM | POA: Diagnosis not present

## 2019-05-24 DIAGNOSIS — Z95828 Presence of other vascular implants and grafts: Secondary | ICD-10-CM

## 2019-05-24 DIAGNOSIS — C3491 Malignant neoplasm of unspecified part of right bronchus or lung: Secondary | ICD-10-CM

## 2019-05-24 DIAGNOSIS — Z9221 Personal history of antineoplastic chemotherapy: Secondary | ICD-10-CM | POA: Diagnosis not present

## 2019-05-24 DIAGNOSIS — Z79899 Other long term (current) drug therapy: Secondary | ICD-10-CM | POA: Diagnosis not present

## 2019-05-24 DIAGNOSIS — C549 Malignant neoplasm of corpus uteri, unspecified: Secondary | ICD-10-CM

## 2019-05-24 LAB — CBC WITH DIFFERENTIAL (CANCER CENTER ONLY)
Abs Immature Granulocytes: 0.03 10*3/uL (ref 0.00–0.07)
Basophils Absolute: 0.1 10*3/uL (ref 0.0–0.1)
Basophils Relative: 1 %
Eosinophils Absolute: 0.3 10*3/uL (ref 0.0–0.5)
Eosinophils Relative: 4 %
HCT: 34.4 % — ABNORMAL LOW (ref 36.0–46.0)
Hemoglobin: 11.8 g/dL — ABNORMAL LOW (ref 12.0–15.0)
Immature Granulocytes: 0 %
Lymphocytes Relative: 18 %
Lymphs Abs: 1.2 10*3/uL (ref 0.7–4.0)
MCH: 33.9 pg (ref 26.0–34.0)
MCHC: 34.3 g/dL (ref 30.0–36.0)
MCV: 98.9 fL (ref 80.0–100.0)
Monocytes Absolute: 1 10*3/uL (ref 0.1–1.0)
Monocytes Relative: 15 %
Neutro Abs: 4.2 10*3/uL (ref 1.7–7.7)
Neutrophils Relative %: 62 %
Platelet Count: 127 10*3/uL — ABNORMAL LOW (ref 150–400)
RBC: 3.48 MIL/uL — ABNORMAL LOW (ref 3.87–5.11)
RDW: 13.1 % (ref 11.5–15.5)
WBC Count: 6.8 10*3/uL (ref 4.0–10.5)
nRBC: 0 % (ref 0.0–0.2)

## 2019-05-24 LAB — TSH: TSH: 1.501 u[IU]/mL (ref 0.308–3.960)

## 2019-05-24 LAB — CMP (CANCER CENTER ONLY)
ALT: 23 U/L (ref 0–44)
AST: 36 U/L (ref 15–41)
Albumin: 4 g/dL (ref 3.5–5.0)
Alkaline Phosphatase: 62 U/L (ref 38–126)
Anion gap: 12 (ref 5–15)
BUN: 25 mg/dL — ABNORMAL HIGH (ref 8–23)
CO2: 26 mmol/L (ref 22–32)
Calcium: 9.7 mg/dL (ref 8.9–10.3)
Chloride: 104 mmol/L (ref 98–111)
Creatinine: 1.05 mg/dL — ABNORMAL HIGH (ref 0.44–1.00)
GFR, Est AFR Am: 58 mL/min — ABNORMAL LOW (ref >60)
GFR, Estimated: 50 mL/min — ABNORMAL LOW (ref >60)
Glucose, Bld: 83 mg/dL (ref 70–99)
Potassium: 4.3 mmol/L (ref 3.5–5.1)
Sodium: 142 mmol/L (ref 135–145)
Total Bilirubin: 0.8 mg/dL (ref 0.3–1.2)
Total Protein: 7.5 g/dL (ref 6.5–8.1)

## 2019-05-24 LAB — TOTAL PROTEIN, URINE DIPSTICK: Protein, ur: NEGATIVE mg/dL

## 2019-05-24 MED ORDER — SODIUM CHLORIDE 0.9% FLUSH
10.0000 mL | Freq: Once | INTRAVENOUS | Status: AC
  Administered 2019-05-24: 10 mL
  Filled 2019-05-24: qty 10

## 2019-05-24 MED ORDER — SODIUM CHLORIDE 0.9% FLUSH
10.0000 mL | INTRAVENOUS | Status: DC | PRN
  Administered 2019-05-24: 13:00:00 10 mL
  Filled 2019-05-24: qty 10

## 2019-05-24 MED ORDER — SODIUM CHLORIDE 0.9 % IV SOLN
Freq: Once | INTRAVENOUS | Status: AC
  Filled 2019-05-24: qty 250

## 2019-05-24 MED ORDER — HEPARIN SOD (PORK) LOCK FLUSH 100 UNIT/ML IV SOLN
500.0000 [IU] | Freq: Once | INTRAVENOUS | Status: AC | PRN
  Administered 2019-05-24: 13:00:00 500 [IU]
  Filled 2019-05-24: qty 5

## 2019-05-24 MED ORDER — SODIUM CHLORIDE 0.9 % IV SOLN
200.0000 mg | Freq: Once | INTRAVENOUS | Status: AC
  Administered 2019-05-24: 13:00:00 200 mg via INTRAVENOUS
  Filled 2019-05-24: qty 8

## 2019-05-24 NOTE — Telephone Encounter (Signed)
Scheduled per 1/11 sch msg. Called and spoke with pt, confirmed 2/1 and 2/22 appt

## 2019-05-24 NOTE — Patient Instructions (Signed)
Pembrolizumab injection What is this medicine? PEMBROLIZUMAB (pem broe liz ue mab) is a monoclonal antibody. It is used to treat certain types of cancer. This medicine may be used for other purposes; ask your health care provider or pharmacist if you have questions. COMMON BRAND NAME(S): Keytruda What should I tell my health care provider before I take this medicine? They need to know if you have any of these conditions:  diabetes  immune system problems  inflammatory bowel disease  liver disease  lung or breathing disease  lupus  received or scheduled to receive an organ transplant or a stem-cell transplant that uses donor stem cells  an unusual or allergic reaction to pembrolizumab, other medicines, foods, dyes, or preservatives  pregnant or trying to get pregnant  breast-feeding How should I use this medicine? This medicine is for infusion into a vein. It is given by a health care professional in a hospital or clinic setting. A special MedGuide will be given to you before each treatment. Be sure to read this information carefully each time. Talk to your pediatrician regarding the use of this medicine in children. While this drug may be prescribed for children as young as 6 months for selected conditions, precautions do apply. Overdosage: If you think you have taken too much of this medicine contact a poison control center or emergency room at once. NOTE: This medicine is only for you. Do not share this medicine with others. What if I miss a dose? It is important not to miss your dose. Call your doctor or health care professional if you are unable to keep an appointment. What may interact with this medicine? Interactions have not been studied. Give your health care provider a list of all the medicines, herbs, non-prescription drugs, or dietary supplements you use. Also tell them if you smoke, drink alcohol, or use illegal drugs. Some items may interact with your medicine. This  list may not describe all possible interactions. Give your health care provider a list of all the medicines, herbs, non-prescription drugs, or dietary supplements you use. Also tell them if you smoke, drink alcohol, or use illegal drugs. Some items may interact with your medicine. What should I watch for while using this medicine? Your condition will be monitored carefully while you are receiving this medicine. You may need blood work done while you are taking this medicine. Do not become pregnant while taking this medicine or for 4 months after stopping it. Women should inform their doctor if they wish to become pregnant or think they might be pregnant. There is a potential for serious side effects to an unborn child. Talk to your health care professional or pharmacist for more information. Do not breast-feed an infant while taking this medicine or for 4 months after the last dose. What side effects may I notice from receiving this medicine? Side effects that you should report to your doctor or health care professional as soon as possible:  allergic reactions like skin rash, itching or hives, swelling of the face, lips, or tongue  bloody or black, tarry  breathing problems  changes in vision  chest pain  chills  confusion  constipation  cough  diarrhea  dizziness or feeling faint or lightheaded  fast or irregular heartbeat  fever  flushing  joint pain  low blood counts - this medicine may decrease the number of white blood cells, red blood cells and platelets. You may be at increased risk for infections and bleeding.  muscle pain  muscle  weakness  pain, tingling, numbness in the hands or feet  persistent headache  redness, blistering, peeling or loosening of the skin, including inside the mouth  signs and symptoms of high blood sugar such as dizziness; dry mouth; dry skin; fruity breath; nausea; stomach pain; increased hunger or thirst; increased urination  signs  and symptoms of kidney injury like trouble passing urine or change in the amount of urine  signs and symptoms of liver injury like dark urine, light-colored stools, loss of appetite, nausea, right upper belly pain, yellowing of the eyes or skin  sweating  swollen lymph nodes  weight loss Side effects that usually do not require medical attention (report to your doctor or health care professional if they continue or are bothersome):  decreased appetite  hair loss  muscle pain  tiredness This list may not describe all possible side effects. Call your doctor for medical advice about side effects. You may report side effects to FDA at 1-800-FDA-1088. Where should I keep my medicine? This drug is given in a hospital or clinic and will not be stored at home. NOTE: This sheet is a summary. It may not cover all possible information. If you have questions about this medicine, talk to your doctor, pharmacist, or health care provider.  2020 Elsevier/Gold Standard (2019-03-05 18:07:58)  Coronavirus (COVID-19) Are you at risk?  Are you at risk for the Coronavirus (COVID-19)?  To be considered HIGH RISK for Coronavirus (COVID-19), you have to meet the following criteria:  . Traveled to [place], [place], [place], [place] or [place]; or in the [place] to [place], [place], [place], or [place]; and have fever, cough, and shortness of breath within the last 2 weeks of travel OR . Been in close contact with a person diagnosed with COVID-19 within the last 2 weeks and have fever, cough, and shortness of breath . IF YOU DO NOT MEET THESE CRITERIA, YOU ARE CONSIDERED LOW RISK FOR COVID-19.  What to do if you are HIGH RISK for COVID-19?  Marland Kitchen If you are having a medical emergency, call 911. . Seek medical care right away. Before you go to a doctor's office, urgent care or emergency department, call ahead and tell them about your recent travel, contact with someone diagnosed with COVID-19, and  your symptoms. You should receive instructions from your physician's office regarding next steps of care.  . When you arrive at healthcare provider, tell the healthcare staff immediately you have returned from visiting [place], [place], [place], [place] or [place]; or traveled in the [place] to [place], [place], [place], or [place]; in the last two weeks or you have been in close contact with a person diagnosed with COVID-19 in the last 2 weeks.   . Tell the health care staff about your symptoms: fever, cough and shortness of breath. . After you have been seen by a medical provider, you will be either: o Tested for (COVID-19) and discharged home on quarantine except to seek medical care if symptoms worsen, and asked to  - Stay home and avoid contact with others until you get your results (4-5 days)  - Avoid travel on public transportation if possible (such as bus, train, or airplane) or o Sent to the Emergency Department by EMS for evaluation, COVID-19 testing, and possible admission depending on your condition and test results.  What to do if you are LOW RISK for COVID-19?  Reduce your risk of any infection by using the same precautions used  for avoiding the common cold or flu:  Marland Kitchen Wash your hands often with soap and warm water for at least 20 seconds.  If soap and water are not readily available, use an alcohol-based hand sanitizer with at least 60% alcohol.  . If coughing or sneezing, cover your mouth and nose by coughing or sneezing into the elbow areas of your shirt or coat, into a tissue or into your sleeve (not your hands). . Avoid shaking hands with others and consider head nods or verbal greetings only. . Avoid touching your eyes, nose, or mouth with unwashed hands.  . Avoid close contact with people who are sick. . Avoid places or events with large numbers of people in one location, like concerts or sporting events. . Carefully consider travel plans you have or are  making. . If you are planning any travel outside or inside the [place], visit the CDC's Travelers' Health webpage for the latest health notices. . If you have some symptoms but not all symptoms, continue to monitor at home and seek medical attention if your symptoms worsen. . If you are having a medical emergency, call 911.   [place] / e-Visit: [email or web address]         [facility]: [facility]: [phone]                   [facility]: [phone]

## 2019-05-25 ENCOUNTER — Encounter: Payer: Self-pay | Admitting: Hematology and Oncology

## 2019-05-25 DIAGNOSIS — G459 Transient cerebral ischemic attack, unspecified: Secondary | ICD-10-CM | POA: Insufficient documentation

## 2019-05-25 DIAGNOSIS — I679 Cerebrovascular disease, unspecified: Secondary | ICD-10-CM | POA: Insufficient documentation

## 2019-05-25 NOTE — Assessment & Plan Note (Signed)
I have reviewed recent documentation at her ER visit She has multiple risk factors for possible TIA/stroke CT imaging was negative Recommend aspirin therapy and aggressive risk factor modification such as close blood pressure monitoring and aggressive blood pressure control

## 2019-05-25 NOTE — Assessment & Plan Note (Signed)
She has history of whitecoat hypertension and likely will have exacerbated hypertension while on [place] We discussed the importance of close monitoring of blood pressure twice a day She is able to secure a new blood pressure machine recently I will get my nurse to call her at the end of the week to get her blood pressure readings at home

## 2019-05-25 NOTE — Assessment & Plan Note (Signed)
She has intermittent pancytopenia due to treatment Currently she is not symptomatic Observe closely

## 2019-05-25 NOTE — Assessment & Plan Note (Signed)
So far, she tolerated treatment well without major side effects I am very concerned about her elevated blood pressure seen She does not check her blood pressure on a regular basis I reinforced the importance of that We will continue minimum 3 months of treatment before repeat imaging study So far, she is not symptomatic from her metastatic disease

## 2019-05-25 NOTE — Assessment & Plan Note (Signed)
She did not complete adjuvant radiation therapy due to recurrent metastatic disease of endometrial cancer We will continue surveillance imaging study to follow

## 2019-05-25 NOTE — Progress Notes (Signed)
[place] OFFICE PROGRESS NOTE  Patient Care Team: Shon Baton, MD as PCP - General (Internal Medicine) Heath Lark, MD as Consulting Physician (Hematology and Oncology)  ASSESSMENT & PLAN:  Endometrial cancer [facility]) So far, she tolerated treatment well without major side effects I am very concerned about her elevated blood pressure seen She does not check her blood pressure on a regular basis I reinforced the importance of that We will continue minimum 3 months of treatment before repeat imaging study So far, she is not symptomatic from her metastatic disease  Primary lung cancer, right [facility]) She did not complete adjuvant radiation therapy due to recurrent metastatic disease of endometrial cancer We will continue surveillance imaging study to follow  Arterial hypotension She has history of whitecoat hypertension and likely will have exacerbated hypertension while on Lenvima We discussed the importance of close monitoring of blood pressure twice a day She is able to secure a new blood pressure machine recently I will get my nurse to call her at the end of the week to get her blood pressure readings at home  Pancytopenia, acquired [facility]) She has intermittent pancytopenia due to treatment Currently she is not symptomatic Observe closely  TIA (transient ischemic attack) I have reviewed recent documentation at her ER visit She has multiple risk factors for possible TIA/stroke CT imaging was negative Recommend aspirin therapy and aggressive risk factor modification such as close blood pressure monitoring and aggressive blood pressure control   No orders of the defined types were placed in this encounter.   All questions were answered. The patient knows to call the clinic with any problems, questions or concerns. The total time spent in the appointment was 30 minutes encounter with patients including review of chart and various tests results, discussions about plan  of care and coordination of care plan   Heath Lark, MD 05/25/2019 7:55 AM  INTERVAL HISTORY: Please see below for problem oriented charting. She returns for treatment and follow-up She was not able to tell me her blood pressure readings recently According to her, she has to get a new blood pressure machine She went to the hospital/emergency room due to recent findings of TIA She complained of some jaw numbness but that resolved She underwent extensive evaluation of which I have reviewed the electronic records extensively She feels well now No recent infusion reaction No recent back pain Denies recent cough, chest pain or shortness of breath  SUMMARY OF ONCOLOGIC HISTORY: Oncology History Overview Note  Biopsy of recurrence 06-2014 ER negative. PDL1 testing low at 2% on the uterine cancer She progressed on carboplatin and Paclitaxel for uterine cancer HER 2 negative on pathology [id number] Negative genetic testing  On her lung cancer sample, [employer] testing revealed 10% PD-1 positive EGFR mutation was detected, exon 19 deletion   Endometrial cancer ([place])  12/01/2007 Initial Diagnosis   She has recurrent papillary serous endometrial carcinoma. Briefly she was diagnosed in 2009 with a FIGO IB pappilary serous carcinoma treated with hysterectomy followed by adjuvant chemo and vaginal brachytherapy. She then had a recurrence diagnosed in late 2012 that was deemed not to be surgically resectable, and was treated with 6 cycles of carbo/taxol followed by 5040 cGy of EBRT to the pelvic mass. She was then followed with serial imaging and was noted in June of this year to have slight increase in the size of the mass, and again in September there was small enlargement of the mass. She had a re-staging PET scan  on 03/10/13 that showed FDG activity in the pelvic mass, with no other areas of disease   12/01/2007 Imaging   CT scan of chest, abdomen and pelvis: 1.  Mass like area of decreased  attenuation in the central uterus, consistent with the known endometrial carcinoma.  Deep myometrial invasion is suspected.  Pelvic MRI without and with contrast could be performed for further staging workup if clinically warranted. 2.  No evidence of extrauterine extension or lymphadenopathy. 3.  Sigmoid diverticulosis incidentally noted.   12/15/2007 Surgery   --12/2007 laparoscopic hysterectomy and PLND --05/2008 complted 6 cycles adjuvant carbo/taxol followed by vaginal cuff brachy --03/2011 exploratory lararoscopy, ureterolysis --4/13 completed 6 cycels carbotaxol --8/13 completed EBRT to the left pelvic sidewall    06/10/2008 Imaging   CT scan abdomen and pelvis 1.  Nonspecific mildly prominent inguinal lymph nodes, right greater than left. 2.  Interval hysterectomy without evidence of recurrent pelvic mass or fluid collection.   03/22/2011 Imaging   Ct scan abdomen and pelvis 1.  Local uterine cancer recurrence with two large nodes along the left pelvic sidewall. 2.  No evidence of bowel obstruction, urinary obstruction, or more distant metastasis.   03/31/2011 Relapse/Recurrence   chemotherapy and external beam radiation   05/16/2011 - 09/17/2011 Chemotherapy   She had 6 cycles of carboplatin and Taxol    07/17/2011 Imaging   Ct scan abdomen and pelvis 1.  Interval improvement in the previously demonstrated left pelvic local recurrence of tumor. 2.  No disease progression or complication identified. 3.  Mild bladder wall thickening on the right, nonspecific and possibly related to incomplete distension and/or radiation therapy. 4.  Cholelithiasis   10/11/2011 Imaging   CT scan abdomen and pelvis 1.  Interval enlargement of the left pelvic sidewall masses. 2.  No new nodular disease in the pelvis or lymphadenopathy. 3.  No evidence  of distant metastasis. 4.  Mild hydronephrosis on the right is similar to prior. 5.  Focal thickening of the right aspect the bladder is stable  compared to prior.    10/28/2011 - 12/05/2011 Radiation Therapy   radiation for pelvic sidewall recurrence   10/28/2011 - 12/05/2011 Radiation Therapy   10/28/11-12/05/11: Radiotherapy to the left pelvic sidewall   03/18/2012 Imaging   CT abdomen 1.  Today's study demonstrates a positive response to therapy with decreased size of left pelvic sidewall nodal masses, as detailed above.  No new soft tissue masses or new lymphadenopathy is identified within the abdomen or pelvis. Continue attention on follow-up studies is recommended. 2.  Cholelithiasis without findings to suggest acute cholecystitis. 3.  Colonic diverticulosis without findings to suggest acute diverticulitis at this time. 4.  Mild asymmetric urinary bladder wall thickening is unchanged compared to prior examinations and is nonspecific.  No definite bladder wall mass is identified at this time. 5.  Additional findings, similar to prior examinations, as above   06/22/2012 Imaging   CT abdomen 1.  Further decreased size of the left pelvic peripherally enhancing lesion. 2.  No new sites of new or progressive disease. 3. Esophageal air fluid level suggests dysmotility or gastroesophageal reflux. 4.  Cholelithiasis   11/02/2012 Imaging   CT abdomen 1.  The left pelvic side wall lesion demonstrates mild increase in size from previous exam. 2.  No new sites of new or progressive disease. 3.  Cholelithiasis.    01/28/2013 Imaging   CT abdomen 1. Interval small increase in volume of enhancing mass adjacent to the left pelvic sidewall. 2.  No evidence of abdominal or pelvic lymphadenopathy   04/02/2013 Surgery   pelvic mass resection with IORT   04/02/2013 - 04/02/2013 Radiation Therapy   04/02/13: Intraoperative radiotherapy to the left pelvis   05/10/2013 PET scan   1. Hypermetabolic mass in the deep left pelvis consistent with metastasis. 2. No additional evidence of local metastasis. No evidence of distant metastasis. 3.  Small right lower lobe pulmonary nodule is not hypermetabolic.   07/27/2013 Imaging   No evidence of recurrent or metastatic disease. Apparent prior resection of the deep left pelvic metastasis.   02/08/2014 Imaging   No CT findings for recurrent or metastatic disease involving the abdomen/ pelvis   06/13/2014 Relapse/Recurrence   + recurrence paraspinous muscle   06/21/2014 Procedure   There is a soft tissue lesion along the left side of L3-L4. This lesion roughly measures up to 2.2 cm. Needle was positioned along the posterior aspect of the lesion. Small amount of air in the paraspinal tissues following needle removal   06/21/2014 Pathology Results   Bone, biopsy, left lumbar paraspinal - METASTATIC POORLY DIFFERENTIATED CARCINOMA, CONSISTENT WITH HIGH GRADE SEROUS CARCINOMA SEE COMMENT. Microscopic Comment The carcinoma demonstrates the following immunophenotype: Cytokeratin 7 - patchy moderate to strong expression Estrogen receptor - negative expression P53 - strong diffuse expression TTF-1 - negative expression WT-1 - negative expression CD56 - focal moderate strong expression Synaptophysin - negative expression GCDFP - negative expression The history of primary endometrial papillary serous carcinoma and primary mammary carcinoma is noted. In the current case, the overall morphologic and immunophenotype are that of poorly differentiated carcinoma, consistent with high grade serous carcinoma.    07/11/2014 - 08/12/2014 Radiation Therapy   07/11/14-08/12/14: 55 Gy in 25 fractions to the Lumbar spine   08/15/2014 Imaging   CT scan of chest, abdomen and pelvis 1. Since the biopsy study of 06/21/2014, similar to slight decrease in size of a left paraspinous lesion at L3-4. 2. No new sites of metastatic disease identified. 3. 8 mm ground-glass nodule in the right lower lobe is grossly similar to 03/10/2013, suggesting a benign etiology. 4. Cholelithiasis. 5. Apparent sigmoid colonic wall  thickening which could be due to underdistention. Colitis felt less likely. 6.  Atherosclerosis, including within the coronary arteries. 7. Pelvic floor laxity.   10/07/2014 Imaging   CT scan of chest, abdomen and pelvis: Stable to slight interval decrease in size of left paraspinous lesion at L3-4. No new sites of metastatic disease. Unchanged ground-glass nodule within the right lower lobe, potentially benign in etiology. Recommend attention on followup. Cholelithiasis. Sigmoid colonic diverticulosis. No CT evidence to suggest acute diverticulitis.   01/19/2015 Imaging   Ct scan of abdomen and pelvis 1. Decrease in size of left paraspinous metastasis. 2. No new sites of disease. 3. Stable ground-glass attenuating nodule within the superior segment of right lower lobe. 4. Gallstones   05/02/2015 Imaging   Ct abdomen and pelvis: Slight interval increase in size of left lumbar paraspinal soft tissue metastasis. No new sites of metastatic disease identified within the abdomen or pelvis.    05/11/2015 PET scan   1. The small soft tissue mass just lateral to the left L3 for neural foramen is hypermetabolic, favoring malignancy. 2. There is also a small focus of hypermetabolic activity between the left L1 and L2 transverse processes, but without a CT correlate. 3. The 13 mm sub solid nodule in the superior segment right lower lobe is stable from recent exams but has slowly increased in size  over the last 7 years. Although not hypermetabolic, the appearance is concerning for the possibility of a low grade adenocarcinoma. 4. Other imaging findings of potential clinical significance: Chronic bilateral maxillary sinusitis. Coronary, aortic arch, and branch vessel atherosclerotic vascular disease. Aortoiliac atherosclerotic vascular disease. Cholelithiasis. Sigmoid colon diverticulosis. Lumbar scoliosis.   07/11/2015 Imaging   Ct scan of chest, abdomen and pelvis: 1. 13 mm mixed solid and sub solid  nodule in the posterior right lower lobe was present in 2009 and has clearly progressed in the interval since that study. Imaging features remain highly concerning for low-grade or well differentiated adenocarcinoma. 2. Slight increase size of in the hypermetabolic, rim enhancing nodule identified adjacent to the left L3-4 neural foramen. Metastatic disease remains a concern. 3. No evidence for discrete soft tissue lesion between the left L1 and 2 transverse processes, the site of focal FDG uptake on the recent PET-CT. 4. Cholelithiasis. 5. Abdominal aortic atherosclerosis   08/03/2015 - 01/16/2016 Chemotherapy   She received carboplatin and Taxol   11/06/2015 PET scan   1. Hypermetabolic left F2-9 paraspinous metastasis (biopsy-proven) is stable in size and slightly decreased in metabolism. 2. Previously described small focus of hypermetabolism between the left L2 and L3 spinous processes has resolved. 3. New mild linear hypermetabolism to the left of the T11-12 spinous processes without discrete mass on the CT images, favor benign activity related activity, recommend attention on follow-up PET-CT.  4. No definite new sites of hypermetabolic metastatic disease. No recurrent hypermetabolic metastatic disease in the pelvis. 5. Interval stability of subsolid 1.3 cm superior segment right lower lobe pulmonary nodule without associated significant metabolism, which has grown compared to the 2009 chest CT study, and remain suspicious for low grade adenocarcinoma. 6. Additional findings include aortic atherosclerosis, coronary atherosclerosis, mild multinodular goiter with no hypermetabolic thyroid nodules, cholelithiasis and moderate sigmoid diverticulosis.   02/12/2016 PET scan   1. Interval increase in size and metabolic activity of the LEFT paraspinal soft tissue metastasis. 2. New activity within the musculature of the LEFT chest wall. Given the unusual location of the paraspinal metastasis cannot  exclude a second soft tissue metastasis to the musculature however favor benign physiologic activity. 3. Stable RIGHT lower lobe pulmonary nodule. 4. No evidence of local recurrence.     04/08/2016 Imaging   Ct scan of chest, abdomen and pelvis: 1. Similar size of a  left paravertebral abdominal lesion. 2. No new or progressive metastatic disease. 3. No correlate for the muscular activity about the lateral left chest wall. 4.  Coronary artery atherosclerosis. Aortic atherosclerosis. 5. Cholelithiasis. 6. Similar right lower lobe pulmonary nodule.   06/03/2016 Imaging   CT abdomen and pelvis 1. Similar to mild enlargement of a paravertebral soft tissue lesion at the L3-4 level. 2. No new sites of disease identified. 3. Cholelithiasis. 4. Hysterectomy. 5.  Aortic atherosclerosis.    09/02/2016 Imaging   CT abdomen and pelvis 1. Continue mild interval increase in size of LEFT paraspinal mass (1-2 mm). 2. No evidence of new metastatic disease in the abdomen pelvis. 3. Post hysterectomy anatomy   09/26/2016 Imaging   MR lumbar spine 1. Left paraspinal metastasis with epicenter adjacent to the left L3 neural foramen does extend into the foramen to the level of the left lateral epidural space (series 9, image 31), but also tracks cephalad and caudal along the L2-L4 lumbar plexus (series 10, image 15). No extension into the left L2 or L4 neural foramina. And no more distal extension of tumor.  2. Abnormal signal in the medial left psoas and ventral left erector spinae muscles at L2 and L3 is probably denervation related. 3. No bone invasion or osseous metastatic disease. Suspect previous radiation of the L2 through L5 spinal levels. 4. No dural or intradural metastatic disease.    10/22/2016 Procedure   She underwent stereotactic Radiosurgery   10/30/2016 Genetic Testing   Patient has genetic testing done for Inheritable genetic mutation panel. Results revealed patient has no  actionable mutation.   01/21/2017 Imaging   1. Reduced size and conspicuity of the left paraspinal mass at the L3-4 level. The mass still present but has enhancement similar to that of adjacent psoas musculature. 2. No new metastatic disease is identified. 3. Other imaging findings of potential clinical significance: Cholelithiasis. Aortic Atherosclerosis (ICD10-I70.0). Sigmoid diverticulosis. Lumbar scoliosis.   05/09/2017 Imaging   Ct scan of abdomen and pelvis 1. Paraspinal mass adjacent to the left side of L3-L4 is less distinct than prior examinations, and is therefore difficult to discretely measure, however, the overall appearance suggests continued positive response to therapy. 2. No new signs of metastatic disease elsewhere in the abdomen or pelvis. 3. Aortic atherosclerosis, in addition to at least right coronary artery disease. Assessment for potential risk factor modification, dietary therapy or pharmacologic therapy may be warranted, if clinically indicated. 4. Colonic diverticulosis without evidence of acute diverticulitis at this time. 5. Additional incidental findings, as above. Aortic Atherosclerosis (ICD10-I70.0).   08/14/2017 Imaging   1. Treated left paraspinal mass centered at L3-4. Mass size is stable but there has been some evolution of tumor characteristics with less central fluid seen today. Recommend continued surveillance. 2. No evidence of untreated metastasis. 3. Degenerative changes and related impingement are described above.   10/28/2017 Imaging   Stable small left paraspinal soft tissue mass. No new or progressive disease within the abdomen or pelvis.  Cholelithiasis.  No radiographic evidence of cholecystitis.  Colonic diverticulosis, without radiographic evidence of diverticulitis.   04/30/2018 Imaging   Stable post treatment change of the partially necrotic LEFT paravertebral mass at L3-L4. 18 x 21 mm cross-section. Mass effect on the LEFT L3 nerve root  redemonstrated. Enhancing and atrophic LEFT psoas is inseparable.   10/15/2018 Imaging   1. Stable post treatment size and appearance of partially necrotic left paravertebral mass at L3-4, measuring 19 x 25 mm on current exam (unchanged when measured at similar level on previous study). Mass effect on the adjacent left L3 nerve root is unchanged. Adjacent enhancing and atrophic left psoas muscle. 2. Left convex scoliosis with associated multilevel degenerative spondylolysis and facet arthrosis, unchanged   10/29/2018 Imaging   Stable small left L3 paraspinal soft tissue mass. No evidence of new or progressive metastatic disease, or other acute findings.   Cholelithiasis.  No radiographic evidence of cholecystitis.   Colonic diverticulosis, without radiographic evidence of diverticulitis.   11/11/2018 PET scan   1. Low level metabolism (max SUV 2.0) associated with the irregular subsolid superior segment right lower lobe 2.1 cm pulmonary nodule. As better depicted on the recent diagnostic chest CT study, this nodule crosses the major fissure into the right upper lobe and has increased in size and density on multiple imaging studies back to 2009. Findings are most suggestive of primary bronchogenic adenocarcinoma. 2. No hypermetabolic thoracic adenopathy. 3. Persistent hypermetabolism (max SUV 8.0) associated with the known left L3-4 paraspinous metastasis, mildly decreased in metabolism since 02/12/2016 PET-CT. 4. No additional sites of hypermetabolic metastatic disease in the abdomen or  pelvis. No metabolic evidence of peritoneal recurrence. No ascites. 5. Chronic findings include: Aortic Atherosclerosis (ICD10-I70.0). Coronary atherosclerosis. Chronic bilateral maxillary sinusitis. Cholelithiasis. Moderate sigmoid diverticulosis.   01/07/2019 - 04/04/2019 Chemotherapy   The patient had carboplatin and Alimta for chemotherapy treatment.     05/03/2019 -  Chemotherapy   The patient had  Pembrolizumab and Lenvima for chemotherapy treatment.     Metastatic cancer to bone ([place])  06/27/2014 Initial Diagnosis   Metastatic cancer to bone   Primary lung cancer, right ([place])  11/04/2018 Initial Diagnosis   Primary lung cancer, right ([place])   11/11/2018 PET scan   1. Low level metabolism (max SUV 2.0) associated with the irregular subsolid superior segment right lower lobe 2.1 cm pulmonary nodule. As better depicted on the recent diagnostic chest CT study, this nodule crosses the major fissure into the right upper lobe and has increased in size and density on multiple imaging studies back to 2009. Findings are most suggestive of primary bronchogenic adenocarcinoma. 2. No hypermetabolic thoracic adenopathy. 3. Persistent hypermetabolism (max SUV 8.0) associated with the known left L3-4 paraspinous metastasis, mildly decreased in metabolism since 02/12/2016 PET-CT. 4. No additional sites of hypermetabolic metastatic disease in the abdomen or pelvis. No metabolic evidence of peritoneal recurrence. No ascites. 5. Chronic findings include: Aortic Atherosclerosis (ICD10-I70.0). Coronary atherosclerosis. Chronic bilateral maxillary sinusitis. Cholelithiasis. Moderate sigmoid diverticulosis.   12/07/2018 Pathology Results   1. Lung, resection (segmental or lobe), Right Lobe Superior with endblock wedge of right upper lobe - INVASIVE ADENOCARCINOMA, MODERATELY DIFFERENTIATED, SPANNING 1.6 CM. - THE SURGICAL RESECTION MARGINS ARE NEGATIVE FOR CARCINOMA. - SEE ONCOLOGY TABLE BELOW. 2. Lymph node, biopsy, Level 9 #1 - THERE IS NO EVIDENCE OF CARCINOMA IN 1 OF 1 LYMPH NODE (0/1). 3. Lymph node, biopsy, Level 9 #2 - THERE IS NO EVIDENCE OF CARCINOMA IN 1 OF 1 LYMPH NODE (0/1). 4. Lymph node, biopsy, Level 7 #1 - METASTATIC CARCINOMA IN 1 OF 1 LYMPH NODE (1/1). 5. Lymph node, biopsy, Level 7 #2 - THERE IS NO EVIDENCE OF CARCINOMA IN 1 OF 1 LYMPH NODE (0/1). 6. Lymph node, biopsy, Level 12 #1 -  THERE IS NO EVIDENCE OF CARCINOMA IN 1 OF 1 LYMPH NODE (0/1). 7. Lymph node, biopsy, Level 12 #2 - THERE IS NO EVIDENCE OF CARCINOMA IN 1 OF 1 LYMPH NODE (0/1). 8. Lymph node, biopsy, Level 10 #1 - THERE IS NO EVIDENCE OF CARCINOMA IN 1 OF 1 LYMPH NODE (0/1). 9. Lymph node, biopsy, Level 4R #1 - THERE IS NO EVIDENCE OF CARCINOMA IN 1 OF 1 LYMPH NODE (0/1). 10. Lymph node, biopsy, Level 4R #2 - THERE IS NO EVIDENCE OF CARCINOMA IN 1 OF 1 LYMPH NODE (0/1). 11. Lymph node, biopsy, Level 2R #1 - THERE IS NO EVIDENCE OF CARCINOMA IN 1 OF 1 LYMPH NODE (0/1). 12. Lung, resection (segmental or lobe), Right Lobe Superior - BENIGN LUNG PARENCHYMA. - THERE IS NO EVIDENCE OF MALIGNANCY. Procedure: Segmentectomy. Specimen Laterality: Right. Tumor Site: Right superior lobe. Tumor Size: 1.6 cm (gross measurement). Tumor Focality: Unifocal. Histologic Type: Adenocarcinoma, moderately differentiated. Visceral Pleura Invasion: Not identified. Lymphovascular Invasion: Not identified. Direct Invasion of Adjacent Structures: Not identified. Margins: Negative for carcinoma. Treatment Effect: N/A Regional Lymph Nodes: Number of Lymph Nodes Involved: 1 (level 7) Number of Lymph Nodes Examined: 10 Pathologic Stage Classification (pTNM, AJCC 8th Edition): pT1b, pN2 Ancillary Studies: The tumor cells are positive for Napsin-A, TTF-1, and p53. They are essentially negative for estrogen receptor, PAX-8  and progesterone receptor. Additional studies can be performed upon clinician request. Representative Tumor Block: 1D-1E. (JBK:gt, 12/09/18)   12/07/2018 Surgery   PREOPERATIVE DIAGNOSIS:  Right lung nodule.   POSTOPERATIVE DIAGNOSIS:  Non-small cell carcinoma- suspected primary lung carcinoma, clinical stage T2N01b.   PROCEDURE:   Right video-assisted thoracoscopy, Right lower lobe superior segmentectomy with en bloc wedge resection of right upper lobe, Lymph node dissection, Intercostal nerve block levels  3-9.   SURGEON:  Modesto Charon, MD   FINDINGS:  Mass palpable in posterior aspect of superior segment of right lower lobe, likely involvement across fissure into the upper lobe.  Frozen section revealed non-small cell carcinoma. upper and lower lobe margins clear.   12/23/2018 Cancer Staging   Staging form: Lung, AJCC 8th Edition - Pathologic: Stage IIIA (pT1b, pN2, cM0) - Signed by Heath Lark, MD on 12/23/2018   01/05/2019 Imaging   MRI brain 1. No metastatic disease or acute intracranial abnormality identified. 2. Small chronic parafalcine meningioma size is stable since that described in 2006 (12 x 15 mm). 3. Advanced signal changes in the cerebral white matter and to a lesser extent pons, nonspecific but most commonly due to chronic small vessel disease. 4. Partially visible degenerative cervical spinal stenosis.     01/07/2019 -  Chemotherapy   The patient had carboplatin and Alimta for chemotherapy treatment.     04/14/2019 PET scan   1. 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 As shown on recent MRI of 04/01/2019, there is a new component of invasion into the left lower L3 vertebral body. This new component has a maximum SUV of 10.0, compatible with active malignancy. 2. Interval wedge resection of portions of the right upper lobe and right lower lobe at the site of the prior nodule. There is only minimal low-grade activity along the wedge resection clips, maximum SUV of 1.5. Surveillance is likely warranted. No new nodule identified. 3. Other imaging findings of potential clinical significance: Aortic Atherosclerosis (ICD10-I70.0). Coronary atherosclerosis. Thoracolumbar scoliosis. Cholelithiasis. Sigmoid colon diverticulosis.     05/03/2019 -  Chemotherapy   The patient had Pembrolizumab and Lenvima for chemotherapy treatment.     05/22/2019 Imaging   Ct head Atrophy, chronic microvascular disease.   No acute intracranial abnormality.        REVIEW OF SYSTEMS:   Constitutional: Denies fevers, chills or abnormal weight loss Eyes: Denies blurriness of vision Ears, nose, mouth, throat, and face: Denies mucositis or sore throat Respiratory: Denies cough, dyspnea or wheezes Cardiovascular: Denies palpitation, chest discomfort or lower extremity swelling Gastrointestinal:  Denies nausea, heartburn or change in bowel habits Skin: Denies abnormal skin rashes Lymphatics: Denies new lymphadenopathy or easy bruising Behavioral/Psych: Mood is stable, no new changes  All other systems were reviewed with the patient and are negative.  I have reviewed the past medical history, past surgical history, social history and family history with the patient and they are unchanged from previous note.  ALLERGIES:  is allergic to ace inhibitors; dilaudid [hydromorphone hcl]; and codeine.  MEDICATIONS:  Current Outpatient Medications  Medication Sig Dispense Refill  . aspirin EC 81 MG tablet Take 81 mg by mouth daily.    Marland Kitchen amLODipine (NORVASC) 5 MG tablet Take 5 mg by mouth daily.    Marland Kitchen atenolol (TENORMIN) 25 MG tablet Take 25 mg by mouth 2 (two) times daily.     Marland Kitchen gabapentin (NEURONTIN) 100 MG capsule Take 100 mg by mouth at bedtime.    Marland Kitchen lenvatinib 10  mg daily dose (LENVIMA, 10 MG DAILY DOSE,) capsule Take 1 capsule (10 mg total) by mouth daily. 30 capsule 11  . lidocaine-prilocaine (EMLA) cream Apply to [facility] cath 1-2 hours prior to access as directed 30 g 1  . LORazepam (ATIVAN) 0.5 MG tablet 1 tablet po 30 minutes prior to radiation or MRI 30 tablet 0  . Magnesium 250 MG TABS Take 250 mg by mouth daily.     . Multiple Vitamin (MULTIVITAMIN WITH MINERALS) TABS tablet Take 1 tablet by mouth daily.    . simvastatin (ZOCOR) 20 MG tablet Take 20 mg by mouth every evening.      No current facility-administered medications for this visit.   Facility-Administered Medications Ordered in Other Visits  Medication Dose Route Frequency Provider Last  Rate Last Admin  . heparin lock flush 100 unit/mL  500 Units Intracatheter Once Jarron Curley, MD      . sodium chloride flush (NS) 0.9 % injection 10 mL  10 mL Intracatheter Once Alvy Bimler, Cariana Karge, MD        PHYSICAL EXAMINATION: ECOG PERFORMANCE STATUS: 1 - Symptomatic but completely ambulatory  Vitals:   05/24/19 1120  BP: (!) 163/87  Pulse: 82  Resp: 18  Temp: 98.2 F (36.8 C)  SpO2: 94%   Filed Weights   05/24/19 1120  Weight: 109 lb 9.6 oz (49.7 kg)    GENERAL:alert, no distress and comfortable SKIN: skin color, texture, turgor are normal, no rashes or significant lesions EYES: normal, Conjunctiva are pink and non-injected, sclera clear OROPHARYNX:no exudate, no erythema and lips, buccal mucosa, and tongue normal  NECK: supple, thyroid normal size, non-tender, without nodularity LYMPH:  no palpable lymphadenopathy in the cervical, axillary or inguinal LUNGS: clear to auscultation and percussion with normal breathing effort HEART: regular rate & rhythm and no murmurs and no lower extremity edema ABDOMEN:abdomen soft, non-tender and normal bowel sounds Musculoskeletal:no cyanosis of digits and no clubbing  NEURO: alert & oriented x 3 with fluent speech, no focal motor/sensory deficits  LABORATORY DATA:  I have reviewed the data as listed    Component Value Date/Time   NA 142 05/24/2019 1055   NA 140 05/09/2017 0844   K 4.3 05/24/2019 1055   K 4.1 05/09/2017 0844   CL 104 05/24/2019 1055   CO2 26 05/24/2019 1055   CO2 26 05/09/2017 0844   GLUCOSE 83 05/24/2019 1055   GLUCOSE 82 05/09/2017 0844   BUN 25 (H) 05/24/2019 1055   BUN 20.7 05/09/2017 0844   CREATININE 1.05 (H) 05/24/2019 1055   CREATININE 0.7 05/09/2017 0844   CALCIUM 9.7 05/24/2019 1055   CALCIUM 9.3 05/09/2017 0844   PROT 7.5 05/24/2019 1055   PROT 6.8 05/09/2017 0844   ALBUMIN 4.0 05/24/2019 1055   ALBUMIN 3.6 05/09/2017 0844   AST 36 05/24/2019 1055   AST 27 05/09/2017 0844   ALT 23 05/24/2019 1055    ALT 17 05/09/2017 0844   ALKPHOS 62 05/24/2019 1055   ALKPHOS 43 05/09/2017 0844   BILITOT 0.8 05/24/2019 1055   BILITOT 0.81 05/09/2017 0844   GFRNONAA 50 (L) 05/24/2019 1055   GFRAA 58 (L) 05/24/2019 1055    No results found for: SPEP, UPEP  Lab Results  Component Value Date   WBC 6.8 05/24/2019   NEUTROABS 4.2 05/24/2019   HGB 11.8 (L) 05/24/2019   HCT 34.4 (L) 05/24/2019   MCV 98.9 05/24/2019   PLT 127 (L) 05/24/2019      Chemistry  Component Value Date/Time   NA 142 05/24/2019 1055   NA 140 05/09/2017 0844   K 4.3 05/24/2019 1055   K 4.1 05/09/2017 0844   CL 104 05/24/2019 1055   CO2 26 05/24/2019 1055   CO2 26 05/09/2017 0844   BUN 25 (H) 05/24/2019 1055   BUN 20.7 05/09/2017 0844   CREATININE 1.05 (H) 05/24/2019 1055   CREATININE 0.7 05/09/2017 0844      Component Value Date/Time   CALCIUM 9.7 05/24/2019 1055   CALCIUM 9.3 05/09/2017 0844   ALKPHOS 62 05/24/2019 1055   ALKPHOS 43 05/09/2017 0844   AST 36 05/24/2019 1055   AST 27 05/09/2017 0844   ALT 23 05/24/2019 1055   ALT 17 05/09/2017 0844   BILITOT 0.8 05/24/2019 1055   BILITOT 0.81 05/09/2017 0844       RADIOGRAPHIC STUDIES: I have personally reviewed the radiological images as listed and agreed with the findings in the report. CT HEAD WO CONTRAST  Result Date: 05/22/2019 CLINICAL DATA:  TIA, lip numbness EXAM: CT HEAD WITHOUT CONTRAST TECHNIQUE: Contiguous axial images were obtained from the base of the skull through the vertex without intravenous contrast. COMPARISON:  MRI 01/05/2019 FINDINGS: Brain: There is atrophy and chronic small vessel disease changes. No acute intracranial abnormality. Specifically, no hemorrhage, hydrocephalus, mass lesion, acute infarction, or significant intracranial injury. Vascular: No hyperdense vessel or unexpected calcification. Skull: No acute calvarial abnormality. Sinuses/Orbits: No acute finding Other: None IMPRESSION: Atrophy, chronic microvascular  disease. No acute intracranial abnormality. Electronically Signed   By: Rolm Baptise M.D.   On: 05/22/2019 16:32

## 2019-05-26 ENCOUNTER — Other Ambulatory Visit: Payer: Self-pay | Admitting: Radiation Therapy

## 2019-05-27 ENCOUNTER — Other Ambulatory Visit: Payer: Self-pay | Admitting: Hematology and Oncology

## 2019-05-27 ENCOUNTER — Telehealth: Payer: Self-pay | Admitting: *Deleted

## 2019-05-27 MED ORDER — AMLODIPINE BESYLATE 10 MG PO TABS: 5.0000 mg | ORAL_TABLET | Freq: Every day | ORAL | 6 refills | Status: DC

## 2019-05-27 NOTE — Telephone Encounter (Signed)
Patient called very concerned about her BP. She states it has been running very high, . She asked if she could stop by today to have an RN check it. I advised her to come in as soon as she can and this RN would check her BP. She does not report any chest pain or SOB.

## 2019-05-27 NOTE — Telephone Encounter (Signed)
Patient came to have her BP checked by RN. The reading was 179/87. Patient states she has failed to keep a log and reports her BP has been "very high" for the past few days. She was unable to recall any readings. She denies any SOB or chest pain, no lightheadedness or tinnitus. Reported this reading to Dr. Alvy Bimler. New orders given to increase amlodipine to 10mg  and stop Lenvima. Patient wrote down directions and repeated back new prescription information. She states she has been a "little foggy headed" and has been forgetting things frequently. She understands to call this office with any questions or concerns. Advised patient this RN would call her on Monday.

## 2019-05-27 NOTE — Telephone Encounter (Signed)
Let's increase to 10 mg amlodipine and stop Lenvima

## 2019-05-31 ENCOUNTER — Telehealth: Payer: Self-pay | Admitting: *Deleted

## 2019-05-31 NOTE — Telephone Encounter (Signed)
-----   Message from Heath Lark, MD sent at 05/31/2019  8:35 AM EST ----- Regarding: can you call and ask how is her BP?

## 2019-05-31 NOTE — Telephone Encounter (Signed)
Telephone call to patient- she is feeling much better. She has stopped the Lenvima. Patient reports she misunderstood that she is suppose to take 10mg  of amlodipine. She will start this today. She said she needs to write this down.   BP Log- Patient reports she has failed to keep a log. She recalls our conversations about this and she has not complied. She said she needs to write this down so she will remember. I inquired about the instructions written down when she was here for the BP check. She does not recall [place] witting these directions together.    Patient agrees to come in tomorrow at noon with her medications and husband. She repeated the appt date/time.

## 2019-06-01 ENCOUNTER — Other Ambulatory Visit: Payer: Self-pay

## 2019-06-01 ENCOUNTER — Inpatient Hospital Stay (HOSPITAL_BASED_OUTPATIENT_CLINIC_OR_DEPARTMENT_OTHER): Payer: Medicare Other | Admitting: Hematology and Oncology

## 2019-06-01 DIAGNOSIS — C7951 Secondary malignant neoplasm of bone: Secondary | ICD-10-CM | POA: Diagnosis not present

## 2019-06-01 DIAGNOSIS — G459 Transient cerebral ischemic attack, unspecified: Secondary | ICD-10-CM

## 2019-06-01 DIAGNOSIS — I10 Essential (primary) hypertension: Secondary | ICD-10-CM | POA: Diagnosis not present

## 2019-06-01 DIAGNOSIS — C541 Malignant neoplasm of endometrium: Secondary | ICD-10-CM

## 2019-06-01 DIAGNOSIS — Z5112 Encounter for antineoplastic immunotherapy: Secondary | ICD-10-CM | POA: Diagnosis not present

## 2019-06-01 DIAGNOSIS — C3431 Malignant neoplasm of lower lobe, right bronchus or lung: Secondary | ICD-10-CM | POA: Diagnosis not present

## 2019-06-01 DIAGNOSIS — R200 Anesthesia of skin: Secondary | ICD-10-CM | POA: Diagnosis not present

## 2019-06-01 MED ORDER — AMLODIPINE BESYLATE 10 MG PO TABS: 10.0000 mg | ORAL_TABLET | Freq: Every day | ORAL | 6 refills | Status: DC

## 2019-06-02 ENCOUNTER — Encounter: Payer: Self-pay | Admitting: Hematology and Oncology

## 2019-06-02 NOTE — Progress Notes (Signed)
[place] OFFICE PROGRESS NOTE  Patient Care Team: Shon Baton, MD as PCP - General (Internal Medicine) Heath Lark, MD as Consulting Physician (Hematology and Oncology)  ASSESSMENT & PLAN:  Endometrial cancer [facility]) I have reviewed the plan of care with the patient and her husband extensively The patient have intermittently stop Lenvima due to perceived side effects from treatment and was not able to get her blood pressure checked as directed I told the patient and her husband that [place] alone is not adequate to treat her metastatic uterine cancer After much discussion, she is in agreement to restart Lenvima and to get her blood pressure monitor closely  If the patient have difficulties following instructions or tolerating Lenvima, we will consider switching her treatment to something else   TIA (transient ischemic attack) She has intermittent jaw numbness that comes and goes which she attributed to side effects of Lenvima She went to the emergency department recently and CT imaging showed no evidence of stroke From our previous meeting, I recommend the patient to take aspirin The patient did not recall such conversation I reinforced the importance of her taking aspirin in the presence of her husband  Essential hypertension I have reviewed her medications with the patient and her husband She is able to produce 2 sets of blood pressure monitoring over the last 2 days, taken in the morning Per previous discussion, I have advised her to take her blood pressure twice a day and not just once a day I also recommend she document her heart rate with each blood pressure result Her blood pressure over the last few days is borderline satisfactory but we just increase the dose of amlodipine recently in for now, I do not plan to change her medications further I have given the patient and her husband instruction to call me if her systolic blood pressure is more than 607 or her  diastolic blood pressure more than 100   No orders of the defined types were placed in this encounter.   All questions were answered. The patient knows to call the clinic with any problems, questions or concerns. The total time spent in the appointment was 20 minutes encounter with patients including review of chart and various tests results, discussions about plan of care and coordination of care plan   Heath Lark, MD 06/02/2019 10:27 AM  INTERVAL HISTORY: Please see below for problem oriented charting. She returns for clinic visit today with her husband due to concerns about her blood pressure From my last visit, I was made aware of her emergency room visit I recommend aspirin therapy and for her to check her blood pressure twice a day When my nurse call her recently, the patient stopped taking her Lenvima due to recurrent jaw numbness sensation She also failed to produce her blood pressure readings from home She brought with her all her medications today She was able to increase the dose of amlodipine the pain as instructed She have 2 blood pressure readings from the last 2 morning, with systolic blood pressure slightly over 140 I did not see her heart rate monitoring When she started to have recurrent numbness on her face, she immediately thought there might be a episode of TIA and she stopped taking Lenvima She had no further recurrence of symptoms since then  SUMMARY OF ONCOLOGIC HISTORY: Oncology History Overview Note  Biopsy of recurrence 06-2014 ER negative. PDL1 testing low at 2% on the uterine cancer She progressed on carboplatin and Paclitaxel for  uterine cancer HER 2 negative on pathology [id number] Negative genetic testing  On her lung cancer sample, [employer] testing revealed 10% PD-1 positive EGFR mutation was detected, exon 19 deletion   Endometrial cancer ([place])  12/01/2007 Initial Diagnosis   She has recurrent papillary serous endometrial carcinoma. Briefly  she was diagnosed in 2009 with a FIGO IB pappilary serous carcinoma treated with hysterectomy followed by adjuvant chemo and vaginal brachytherapy. She then had a recurrence diagnosed in late 2012 that was deemed not to be surgically resectable, and was treated with 6 cycles of carbo/taxol followed by 5040 cGy of EBRT to the pelvic mass. She was then followed with serial imaging and was noted in June of this year to have slight increase in the size of the mass, and again in September there was small enlargement of the mass. She had a re-staging PET scan on 03/10/13 that showed FDG activity in the pelvic mass, with no other areas of disease   12/01/2007 Imaging   CT scan of chest, abdomen and pelvis: 1.  Mass like area of decreased attenuation in the central uterus, consistent with the known endometrial carcinoma.  Deep myometrial invasion is suspected.  Pelvic MRI without and with contrast could be performed for further staging workup if clinically warranted. 2.  No evidence of extrauterine extension or lymphadenopathy. 3.  Sigmoid diverticulosis incidentally noted.   12/15/2007 Surgery   --12/2007 laparoscopic hysterectomy and PLND --05/2008 complted 6 cycles adjuvant carbo/taxol followed by vaginal cuff brachy --03/2011 exploratory lararoscopy, ureterolysis --4/13 completed 6 cycels carbotaxol --8/13 completed EBRT to the left pelvic sidewall    06/10/2008 Imaging   CT scan abdomen and pelvis 1.  Nonspecific mildly prominent inguinal lymph nodes, right greater than left. 2.  Interval hysterectomy without evidence of recurrent pelvic mass or fluid collection.   03/22/2011 Imaging   Ct scan abdomen and pelvis 1.  Local uterine cancer recurrence with two large nodes along the left pelvic sidewall. 2.  No evidence of bowel obstruction, urinary obstruction, or more distant metastasis.   03/31/2011 Relapse/Recurrence   chemotherapy and external beam radiation   05/16/2011 - 09/17/2011 Chemotherapy    She had 6 cycles of carboplatin and Taxol    07/17/2011 Imaging   Ct scan abdomen and pelvis 1.  Interval improvement in the previously demonstrated left pelvic local recurrence of tumor. 2.  No disease progression or complication identified. 3.  Mild bladder wall thickening on the right, nonspecific and possibly related to incomplete distension and/or radiation therapy. 4.  Cholelithiasis   10/11/2011 Imaging   CT scan abdomen and pelvis 1.  Interval enlargement of the left pelvic sidewall masses. 2.  No new nodular disease in the pelvis or lymphadenopathy. 3.  No evidence  of distant metastasis. 4.  Mild hydronephrosis on the right is similar to prior. 5.  Focal thickening of the right aspect the bladder is stable compared to prior.    10/28/2011 - 12/05/2011 Radiation Therapy   radiation for pelvic sidewall recurrence   10/28/2011 - 12/05/2011 Radiation Therapy   10/28/11-12/05/11: Radiotherapy to the left pelvic sidewall   03/18/2012 Imaging   CT abdomen 1.  Today's study demonstrates a positive response to therapy with decreased size of left pelvic sidewall nodal masses, as detailed above.  No new soft tissue masses or new lymphadenopathy is identified within the abdomen or pelvis. Continue attention on follow-up studies is recommended. 2.  Cholelithiasis without findings to suggest acute cholecystitis. 3.  Colonic diverticulosis without findings to  suggest acute diverticulitis at this time. 4.  Mild asymmetric urinary bladder wall thickening is unchanged compared to prior examinations and is nonspecific.  No definite bladder wall mass is identified at this time. 5.  Additional findings, similar to prior examinations, as above   06/22/2012 Imaging   CT abdomen 1.  Further decreased size of the left pelvic peripherally enhancing lesion. 2.  No new sites of new or progressive disease. 3. Esophageal air fluid level suggests dysmotility or gastroesophageal reflux. 4.  Cholelithiasis    11/02/2012 Imaging   CT abdomen 1.  The left pelvic side wall lesion demonstrates mild increase in size from previous exam. 2.  No new sites of new or progressive disease. 3.  Cholelithiasis.    01/28/2013 Imaging   CT abdomen 1. Interval small increase in volume of enhancing mass adjacent to the left pelvic sidewall. 2. No evidence of abdominal or pelvic lymphadenopathy   04/02/2013 Surgery   pelvic mass resection with IORT   04/02/2013 - 04/02/2013 Radiation Therapy   04/02/13: Intraoperative radiotherapy to the left pelvis   05/10/2013 PET scan   1. Hypermetabolic mass in the deep left pelvis consistent with metastasis. 2. No additional evidence of local metastasis. No evidence of distant metastasis. 3. Small right lower lobe pulmonary nodule is not hypermetabolic.   07/27/2013 Imaging   No evidence of recurrent or metastatic disease. Apparent prior resection of the deep left pelvic metastasis.   02/08/2014 Imaging   No CT findings for recurrent or metastatic disease involving the abdomen/ pelvis   06/13/2014 Relapse/Recurrence   + recurrence paraspinous muscle   06/21/2014 Procedure   There is a soft tissue lesion along the left side of L3-L4. This lesion roughly measures up to 2.2 cm. Needle was positioned along the posterior aspect of the lesion. Small amount of air in the paraspinal tissues following needle removal   06/21/2014 Pathology Results   Bone, biopsy, left lumbar paraspinal - METASTATIC POORLY DIFFERENTIATED CARCINOMA, CONSISTENT WITH HIGH GRADE SEROUS CARCINOMA SEE COMMENT. Microscopic Comment The carcinoma demonstrates the following immunophenotype: Cytokeratin 7 - patchy moderate to strong expression Estrogen receptor - negative expression P53 - strong diffuse expression TTF-1 - negative expression WT-1 - negative expression CD56 - focal moderate strong expression Synaptophysin - negative expression GCDFP - negative expression The history of primary  endometrial papillary serous carcinoma and primary mammary carcinoma is noted. In the current case, the overall morphologic and immunophenotype are that of poorly differentiated carcinoma, consistent with high grade serous carcinoma.    07/11/2014 - 08/12/2014 Radiation Therapy   07/11/14-08/12/14: 55 Gy in 25 fractions to the Lumbar spine   08/15/2014 Imaging   CT scan of chest, abdomen and pelvis 1. Since the biopsy study of 06/21/2014, similar to slight decrease in size of a left paraspinous lesion at L3-4. 2. No new sites of metastatic disease identified. 3. 8 mm ground-glass nodule in the right lower lobe is grossly similar to 03/10/2013, suggesting a benign etiology. 4. Cholelithiasis. 5. Apparent sigmoid colonic wall thickening which could be due to underdistention. Colitis felt less likely. 6.  Atherosclerosis, including within the coronary arteries. 7. Pelvic floor laxity.   10/07/2014 Imaging   CT scan of chest, abdomen and pelvis: Stable to slight interval decrease in size of left paraspinous lesion at L3-4. No new sites of metastatic disease. Unchanged ground-glass nodule within the right lower lobe, potentially benign in etiology. Recommend attention on followup. Cholelithiasis. Sigmoid colonic diverticulosis. No CT evidence to suggest acute diverticulitis.  01/19/2015 Imaging   Ct scan of abdomen and pelvis 1. Decrease in size of left paraspinous metastasis. 2. No new sites of disease. 3. Stable ground-glass attenuating nodule within the superior segment of right lower lobe. 4. Gallstones   05/02/2015 Imaging   Ct abdomen and pelvis: Slight interval increase in size of left lumbar paraspinal soft tissue metastasis. No new sites of metastatic disease identified within the abdomen or pelvis.    05/11/2015 PET scan   1. The small soft tissue mass just lateral to the left L3 for neural foramen is hypermetabolic, favoring malignancy. 2. There is also a small focus of hypermetabolic  activity between the left L1 and L2 transverse processes, but without a CT correlate. 3. The 13 mm sub solid nodule in the superior segment right lower lobe is stable from recent exams but has slowly increased in size over the last 7 years. Although not hypermetabolic, the appearance is concerning for the possibility of a low grade adenocarcinoma. 4. Other imaging findings of potential clinical significance: Chronic bilateral maxillary sinusitis. Coronary, aortic arch, and branch vessel atherosclerotic vascular disease. Aortoiliac atherosclerotic vascular disease. Cholelithiasis. Sigmoid colon diverticulosis. Lumbar scoliosis.   07/11/2015 Imaging   Ct scan of chest, abdomen and pelvis: 1. 13 mm mixed solid and sub solid nodule in the posterior right lower lobe was present in 2009 and has clearly progressed in the interval since that study. Imaging features remain highly concerning for low-grade or well differentiated adenocarcinoma. 2. Slight increase size of in the hypermetabolic, rim enhancing nodule identified adjacent to the left L3-4 neural foramen. Metastatic disease remains a concern. 3. No evidence for discrete soft tissue lesion between the left L1 and 2 transverse processes, the site of focal FDG uptake on the recent PET-CT. 4. Cholelithiasis. 5. Abdominal aortic atherosclerosis   08/03/2015 - 01/16/2016 Chemotherapy   She received carboplatin and Taxol   11/06/2015 PET scan   1. Hypermetabolic left Q2-5 paraspinous metastasis (biopsy-proven) is stable in size and slightly decreased in metabolism. 2. Previously described small focus of hypermetabolism between the left L2 and L3 spinous processes has resolved. 3. New mild linear hypermetabolism to the left of the T11-12 spinous processes without discrete mass on the CT images, favor benign activity related activity, recommend attention on follow-up PET-CT.  4. No definite new sites of hypermetabolic metastatic disease. No recurrent  hypermetabolic metastatic disease in the pelvis. 5. Interval stability of subsolid 1.3 cm superior segment right lower lobe pulmonary nodule without associated significant metabolism, which has grown compared to the 2009 chest CT study, and remain suspicious for low grade adenocarcinoma. 6. Additional findings include aortic atherosclerosis, coronary atherosclerosis, mild multinodular goiter with no hypermetabolic thyroid nodules, cholelithiasis and moderate sigmoid diverticulosis.   02/12/2016 PET scan   1. Interval increase in size and metabolic activity of the LEFT paraspinal soft tissue metastasis. 2. New activity within the musculature of the LEFT chest wall. Given the unusual location of the paraspinal metastasis cannot exclude a second soft tissue metastasis to the musculature however favor benign physiologic activity. 3. Stable RIGHT lower lobe pulmonary nodule. 4. No evidence of local recurrence.     04/08/2016 Imaging   Ct scan of chest, abdomen and pelvis: 1. Similar size of a  left paravertebral abdominal lesion. 2. No new or progressive metastatic disease. 3. No correlate for the muscular activity about the lateral left chest wall. 4.  Coronary artery atherosclerosis. Aortic atherosclerosis. 5. Cholelithiasis. 6. Similar right lower lobe pulmonary nodule.   06/03/2016  Imaging   CT abdomen and pelvis 1. Similar to mild enlargement of a paravertebral soft tissue lesion at the L3-4 level. 2. No new sites of disease identified. 3. Cholelithiasis. 4. Hysterectomy. 5.  Aortic atherosclerosis.    09/02/2016 Imaging   CT abdomen and pelvis 1. Continue mild interval increase in size of LEFT paraspinal mass (1-2 mm). 2. No evidence of new metastatic disease in the abdomen pelvis. 3. Post hysterectomy anatomy   09/26/2016 Imaging   MR lumbar spine 1. Left paraspinal metastasis with epicenter adjacent to the left L3 neural foramen does extend into the foramen to the level of the  left lateral epidural space (series 9, image 31), but also tracks cephalad and caudal along the L2-L4 lumbar plexus (series 10, image 15). No extension into the left L2 or L4 neural foramina. And no more distal extension of tumor. 2. Abnormal signal in the medial left psoas and ventral left erector spinae muscles at L2 and L3 is probably denervation related. 3. No bone invasion or osseous metastatic disease. Suspect previous radiation of the L2 through L5 spinal levels. 4. No dural or intradural metastatic disease.    10/22/2016 Procedure   She underwent stereotactic Radiosurgery   10/30/2016 Genetic Testing   Patient has genetic testing done for Inheritable genetic mutation panel. Results revealed patient has no actionable mutation.   01/21/2017 Imaging   1. Reduced size and conspicuity of the left paraspinal mass at the L3-4 level. The mass still present but has enhancement similar to that of adjacent psoas musculature. 2. No new metastatic disease is identified. 3. Other imaging findings of potential clinical significance: Cholelithiasis. Aortic Atherosclerosis (ICD10-I70.0). Sigmoid diverticulosis. Lumbar scoliosis.   05/09/2017 Imaging   Ct scan of abdomen and pelvis 1. Paraspinal mass adjacent to the left side of L3-L4 is less distinct than prior examinations, and is therefore difficult to discretely measure, however, the overall appearance suggests continued positive response to therapy. 2. No new signs of metastatic disease elsewhere in the abdomen or pelvis. 3. Aortic atherosclerosis, in addition to at least right coronary artery disease. Assessment for potential risk factor modification, dietary therapy or pharmacologic therapy may be warranted, if clinically indicated. 4. Colonic diverticulosis without evidence of acute diverticulitis at this time. 5. Additional incidental findings, as above. Aortic Atherosclerosis (ICD10-I70.0).   08/14/2017 Imaging   1. Treated left paraspinal  mass centered at L3-4. Mass size is stable but there has been some evolution of tumor characteristics with less central fluid seen today. Recommend continued surveillance. 2. No evidence of untreated metastasis. 3. Degenerative changes and related impingement are described above.   10/28/2017 Imaging   Stable small left paraspinal soft tissue mass. No new or progressive disease within the abdomen or pelvis.  Cholelithiasis.  No radiographic evidence of cholecystitis.  Colonic diverticulosis, without radiographic evidence of diverticulitis.   04/30/2018 Imaging   Stable post treatment change of the partially necrotic LEFT paravertebral mass at L3-L4. 18 x 21 mm cross-section. Mass effect on the LEFT L3 nerve root redemonstrated. Enhancing and atrophic LEFT psoas is inseparable.   10/15/2018 Imaging   1. Stable post treatment size and appearance of partially necrotic left paravertebral mass at L3-4, measuring 19 x 25 mm on current exam (unchanged when measured at similar level on previous study). Mass effect on the adjacent left L3 nerve root is unchanged. Adjacent enhancing and atrophic left psoas muscle. 2. Left convex scoliosis with associated multilevel degenerative spondylolysis and facet arthrosis, unchanged   10/29/2018 Imaging  Stable small left L3 paraspinal soft tissue mass. No evidence of new or progressive metastatic disease, or other acute findings.   Cholelithiasis.  No radiographic evidence of cholecystitis.   Colonic diverticulosis, without radiographic evidence of diverticulitis.   11/11/2018 PET scan   1. Low level metabolism (max SUV 2.0) associated with the irregular subsolid superior segment right lower lobe 2.1 cm pulmonary nodule. As better depicted on the recent diagnostic chest CT study, this nodule crosses the major fissure into the right upper lobe and has increased in size and density on multiple imaging studies back to 2009. Findings are most suggestive of primary  bronchogenic adenocarcinoma. 2. No hypermetabolic thoracic adenopathy. 3. Persistent hypermetabolism (max SUV 8.0) associated with the known left L3-4 paraspinous metastasis, mildly decreased in metabolism since 02/12/2016 PET-CT. 4. No additional sites of hypermetabolic metastatic disease in the abdomen or pelvis. No metabolic evidence of peritoneal recurrence. No ascites. 5. Chronic findings include: Aortic Atherosclerosis (ICD10-I70.0). Coronary atherosclerosis. Chronic bilateral maxillary sinusitis. Cholelithiasis. Moderate sigmoid diverticulosis.   01/07/2019 - 04/04/2019 Chemotherapy   The patient had carboplatin and Alimta for chemotherapy treatment.     05/03/2019 -  Chemotherapy   The patient had Pembrolizumab and Lenvima for chemotherapy treatment.     Metastatic cancer to bone ([place])  06/27/2014 Initial Diagnosis   Metastatic cancer to bone   Primary lung cancer, right ([place])  11/04/2018 Initial Diagnosis   Primary lung cancer, right ([place])   11/11/2018 PET scan   1. Low level metabolism (max SUV 2.0) associated with the irregular subsolid superior segment right lower lobe 2.1 cm pulmonary nodule. As better depicted on the recent diagnostic chest CT study, this nodule crosses the major fissure into the right upper lobe and has increased in size and density on multiple imaging studies back to 2009. Findings are most suggestive of primary bronchogenic adenocarcinoma. 2. No hypermetabolic thoracic adenopathy. 3. Persistent hypermetabolism (max SUV 8.0) associated with the known left L3-4 paraspinous metastasis, mildly decreased in metabolism since 02/12/2016 PET-CT. 4. No additional sites of hypermetabolic metastatic disease in the abdomen or pelvis. No metabolic evidence of peritoneal recurrence. No ascites. 5. Chronic findings include: Aortic Atherosclerosis (ICD10-I70.0). Coronary atherosclerosis. Chronic bilateral maxillary sinusitis. Cholelithiasis. Moderate sigmoid diverticulosis.    12/07/2018 Pathology Results   1. Lung, resection (segmental or lobe), Right Lobe Superior with endblock wedge of right upper lobe - INVASIVE ADENOCARCINOMA, MODERATELY DIFFERENTIATED, SPANNING 1.6 CM. - THE SURGICAL RESECTION MARGINS ARE NEGATIVE FOR CARCINOMA. - SEE ONCOLOGY TABLE BELOW. 2. Lymph node, biopsy, Level 9 #1 - THERE IS NO EVIDENCE OF CARCINOMA IN 1 OF 1 LYMPH NODE (0/1). 3. Lymph node, biopsy, Level 9 #2 - THERE IS NO EVIDENCE OF CARCINOMA IN 1 OF 1 LYMPH NODE (0/1). 4. Lymph node, biopsy, Level 7 #1 - METASTATIC CARCINOMA IN 1 OF 1 LYMPH NODE (1/1). 5. Lymph node, biopsy, Level 7 #2 - THERE IS NO EVIDENCE OF CARCINOMA IN 1 OF 1 LYMPH NODE (0/1). 6. Lymph node, biopsy, Level 12 #1 - THERE IS NO EVIDENCE OF CARCINOMA IN 1 OF 1 LYMPH NODE (0/1). 7. Lymph node, biopsy, Level 12 #2 - THERE IS NO EVIDENCE OF CARCINOMA IN 1 OF 1 LYMPH NODE (0/1). 8. Lymph node, biopsy, Level 10 #1 - THERE IS NO EVIDENCE OF CARCINOMA IN 1 OF 1 LYMPH NODE (0/1). 9. Lymph node, biopsy, Level 4R #1 - THERE IS NO EVIDENCE OF CARCINOMA IN 1 OF 1 LYMPH NODE (0/1). 10. Lymph node, biopsy, Level 4R #2 -  THERE IS NO EVIDENCE OF CARCINOMA IN 1 OF 1 LYMPH NODE (0/1). 11. Lymph node, biopsy, Level 2R #1 - THERE IS NO EVIDENCE OF CARCINOMA IN 1 OF 1 LYMPH NODE (0/1). 12. Lung, resection (segmental or lobe), Right Lobe Superior - BENIGN LUNG PARENCHYMA. - THERE IS NO EVIDENCE OF MALIGNANCY. Procedure: Segmentectomy. Specimen Laterality: Right. Tumor Site: Right superior lobe. Tumor Size: 1.6 cm (gross measurement). Tumor Focality: Unifocal. Histologic Type: Adenocarcinoma, moderately differentiated. Visceral Pleura Invasion: Not identified. Lymphovascular Invasion: Not identified. Direct Invasion of Adjacent Structures: Not identified. Margins: Negative for carcinoma. Treatment Effect: N/A Regional Lymph Nodes: Number of Lymph Nodes Involved: 1 (level 7) Number of Lymph Nodes Examined:  10 Pathologic Stage Classification (pTNM, AJCC 8th Edition): pT1b, pN2 Ancillary Studies: The tumor cells are positive for Napsin-A, TTF-1, and p53. They are essentially negative for estrogen receptor, PAX-8 and progesterone receptor. Additional studies can be performed upon clinician request. Representative Tumor Block: 1D-1E. (JBK:gt, 12/09/18)   12/07/2018 Surgery   PREOPERATIVE DIAGNOSIS:  Right lung nodule.   POSTOPERATIVE DIAGNOSIS:  Non-small cell carcinoma- suspected primary lung carcinoma, clinical stage T2N01b.   PROCEDURE:   Right video-assisted thoracoscopy, Right lower lobe superior segmentectomy with en bloc wedge resection of right upper lobe, Lymph node dissection, Intercostal nerve block levels 3-9.   SURGEON:  [place] Charon, MD   FINDINGS:  Mass palpable in posterior aspect of superior segment of right lower lobe, likely involvement across fissure into the upper lobe.  Frozen section revealed non-small cell carcinoma. upper and lower lobe margins clear.   12/23/2018 Cancer Staging   Staging form: Lung, AJCC 8th Edition - Pathologic: Stage IIIA (pT1b, pN2, cM0) - Signed by Heath Lark, MD on 12/23/2018   01/05/2019 Imaging   MRI brain 1. No metastatic disease or acute intracranial abnormality identified. 2. Small chronic parafalcine meningioma size is stable since that described in 2006 (12 x 15 mm). 3. Advanced signal changes in the cerebral white matter and to a lesser extent pons, nonspecific but most commonly due to chronic small vessel disease. 4. Partially visible degenerative cervical spinal stenosis.     01/07/2019 -  Chemotherapy   The patient had carboplatin and Alimta for chemotherapy treatment.     04/14/2019 PET scan   1. 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 As shown on recent MRI of 04/01/2019, there is a new component of invasion into the left lower L3 vertebral body. This new component has a maximum SUV of 10.0,  compatible with active malignancy. 2. Interval wedge resection of portions of the right upper lobe and right lower lobe at the site of the prior nodule. There is only minimal low-grade activity along the wedge resection clips, maximum SUV of 1.5. Surveillance is likely warranted. No new nodule identified. 3. Other imaging findings of potential clinical significance: Aortic Atherosclerosis (ICD10-I70.0). Coronary atherosclerosis. Thoracolumbar scoliosis. Cholelithiasis. Sigmoid colon diverticulosis.     05/03/2019 -  Chemotherapy   The patient had Pembrolizumab and Lenvima for chemotherapy treatment.     05/22/2019 Imaging   Ct head Atrophy, chronic microvascular disease.   No acute intracranial abnormality.       REVIEW OF SYSTEMS:   Constitutional: Denies fevers, chills or abnormal weight loss Eyes: Denies blurriness of vision Ears, nose, mouth, throat, and face: Denies mucositis or sore throat Respiratory: Denies cough, dyspnea or wheezes Cardiovascular: Denies palpitation, chest discomfort or lower extremity swelling Gastrointestinal:  Denies nausea, heartburn or change in bowel habits Skin: Denies abnormal  skin rashes Lymphatics: Denies new lymphadenopathy or easy bruising Behavioral/Psych: Mood is stable, no new changes  All other systems were reviewed with the patient and are negative.  I have reviewed the past medical history, past surgical history, social history and family history with the patient and they are unchanged from previous note.  ALLERGIES:  is allergic to ace inhibitors; dilaudid [hydromorphone hcl]; and codeine.  MEDICATIONS:  Current Outpatient Medications  Medication Sig Dispense Refill  . amLODipine (NORVASC) 10 MG tablet Take 1 tablet (10 mg total) by mouth daily. 30 tablet 6  . aspirin EC 81 MG tablet Take 81 mg by mouth daily.    Marland Kitchen atenolol (TENORMIN) 25 MG tablet Take 25 mg by mouth 2 (two) times daily.     Marland Kitchen lenvatinib 10 mg daily dose (LENVIMA, 10  MG DAILY DOSE,) capsule Take 1 capsule (10 mg total) by mouth daily. 30 capsule 11  . lidocaine-prilocaine (EMLA) cream Apply to [facility] cath 1-2 hours prior to access as directed 30 g 1  . LORazepam (ATIVAN) 0.5 MG tablet 1 tablet po 30 minutes prior to radiation or MRI 30 tablet 0  . Multiple Vitamin (MULTIVITAMIN WITH MINERALS) TABS tablet Take 1 tablet by mouth daily.    . simvastatin (ZOCOR) 20 MG tablet Take 20 mg by mouth every evening.      No current facility-administered medications for this visit.   Facility-Administered Medications Ordered in Other Visits  Medication Dose Route Frequency Provider Last Rate Last Admin  . heparin lock flush 100 unit/mL  500 Units Intracatheter Once Kiwana Deblasi, MD      . sodium chloride flush (NS) 0.9 % injection 10 mL  10 mL Intracatheter Once Alvy Bimler, Frannie Shedrick, MD        PHYSICAL EXAMINATION: ECOG PERFORMANCE STATUS: 1 - Symptomatic but completely ambulatory  Vitals:   06/01/19 1219  BP: 133/64  Pulse: 86  Resp: 18  Temp: 98.3 F (36.8 C)  SpO2: 100%   Filed Weights   06/01/19 1219  Weight: 112 lb (50.8 kg)    GENERAL:alert, no distress and comfortable NEURO: alert & oriented x 3 with fluent speech, no focal motor/sensory deficits  LABORATORY DATA:  I have reviewed the data as listed    Component Value Date/Time   NA 142 05/24/2019 1055   NA 140 05/09/2017 0844   K 4.3 05/24/2019 1055   K 4.1 05/09/2017 0844   CL 104 05/24/2019 1055   CO2 26 05/24/2019 1055   CO2 26 05/09/2017 0844   GLUCOSE 83 05/24/2019 1055   GLUCOSE 82 05/09/2017 0844   BUN 25 (H) 05/24/2019 1055   BUN 20.7 05/09/2017 0844   CREATININE 1.05 (H) 05/24/2019 1055   CREATININE 0.7 05/09/2017 0844   CALCIUM 9.7 05/24/2019 1055   CALCIUM 9.3 05/09/2017 0844   PROT 7.5 05/24/2019 1055   PROT 6.8 05/09/2017 0844   ALBUMIN 4.0 05/24/2019 1055   ALBUMIN 3.6 05/09/2017 0844   AST 36 05/24/2019 1055   AST 27 05/09/2017 0844   ALT 23 05/24/2019 1055   ALT 17  05/09/2017 0844   ALKPHOS 62 05/24/2019 1055   ALKPHOS 43 05/09/2017 0844   BILITOT 0.8 05/24/2019 1055   BILITOT 0.81 05/09/2017 0844   GFRNONAA 50 (L) 05/24/2019 1055   GFRAA 58 (L) 05/24/2019 1055    No results found for: SPEP, UPEP  Lab Results  Component Value Date   WBC 6.8 05/24/2019   NEUTROABS 4.2 05/24/2019   HGB 11.8 (L) 05/24/2019  HCT 34.4 (L) 05/24/2019   MCV 98.9 05/24/2019   PLT 127 (L) 05/24/2019      Chemistry      Component Value Date/Time   NA 142 05/24/2019 1055   NA 140 05/09/2017 0844   K 4.3 05/24/2019 1055   K 4.1 05/09/2017 0844   CL 104 05/24/2019 1055   CO2 26 05/24/2019 1055   CO2 26 05/09/2017 0844   BUN 25 (H) 05/24/2019 1055   BUN 20.7 05/09/2017 0844   CREATININE 1.05 (H) 05/24/2019 1055   CREATININE 0.7 05/09/2017 0844      Component Value Date/Time   CALCIUM 9.7 05/24/2019 1055   CALCIUM 9.3 05/09/2017 0844   ALKPHOS 62 05/24/2019 1055   ALKPHOS 43 05/09/2017 0844   AST 36 05/24/2019 1055   AST 27 05/09/2017 0844   ALT 23 05/24/2019 1055   ALT 17 05/09/2017 0844   BILITOT 0.8 05/24/2019 1055   BILITOT 0.81 05/09/2017 0844       RADIOGRAPHIC STUDIES: I have personally reviewed the radiological images as listed and agreed with the findings in the report. CT HEAD WO CONTRAST  Result Date: 05/22/2019 CLINICAL DATA:  TIA, lip numbness EXAM: CT HEAD WITHOUT CONTRAST TECHNIQUE: Contiguous axial images were obtained from the base of the skull through the vertex without intravenous contrast. COMPARISON:  MRI 01/05/2019 FINDINGS: Brain: There is atrophy and chronic small vessel disease changes. No acute intracranial abnormality. Specifically, no hemorrhage, hydrocephalus, mass lesion, acute infarction, or significant intracranial injury. Vascular: No hyperdense vessel or unexpected calcification. Skull: No acute calvarial abnormality. Sinuses/Orbits: No acute finding Other: None IMPRESSION: Atrophy, chronic microvascular disease. No  acute intracranial abnormality. Electronically Signed   By: Rolm Baptise M.D.   On: 05/22/2019 16:32

## 2019-06-02 NOTE — Assessment & Plan Note (Signed)
I have reviewed her medications with the patient and her husband She is able to produce 2 sets of blood pressure monitoring over the last 2 days, taken in the morning Per previous discussion, I have advised her to take her blood pressure twice a day and not just once a day I also recommend she document her heart rate with each blood pressure result Her blood pressure over the last few days is borderline satisfactory but we just increase the dose of amlodipine recently in for now, I do not plan to change her medications further I have given the patient and her husband instruction to call me if her systolic blood pressure is more than 508 or her diastolic blood pressure more than 100

## 2019-06-02 NOTE — Assessment & Plan Note (Signed)
She has intermittent jaw numbness that comes and goes which she attributed to side effects of Lenvima She went to the emergency department recently and CT imaging showed no evidence of stroke From our previous meeting, I recommend the patient to take aspirin The patient did not recall such conversation I reinforced the importance of her taking aspirin in the presence of her husband

## 2019-06-02 NOTE — Assessment & Plan Note (Addendum)
I have reviewed the plan of care with the patient and her husband extensively The patient have intermittently stop Lenvima due to perceived side effects from treatment and was not able to get her blood pressure checked as directed I told the patient and her husband that [place] alone is not adequate to treat her metastatic uterine cancer After much discussion, she is in agreement to restart Lenvima and to get her blood pressure monitor closely  If the patient have difficulties following instructions or tolerating Lenvima, we will consider switching her treatment to something else

## 2019-06-07 ENCOUNTER — Ambulatory Visit: Payer: Medicare Other | Admitting: Hematology and Oncology

## 2019-06-07 ENCOUNTER — Other Ambulatory Visit: Payer: Medicare Other

## 2019-06-09 ENCOUNTER — Telehealth: Payer: Self-pay

## 2019-06-09 NOTE — Telephone Encounter (Signed)
Looks great No change in BP management

## 2019-06-09 NOTE — Telephone Encounter (Signed)
Called and given below message. She verbalized understanding. 

## 2019-06-09 NOTE — Telephone Encounter (Signed)
Called and given below message. She verbalized understanding.  Blood pressure readings. 1/20 am 143/82, pulse 75, pm- 134/82 and pulse 75. 1/21 am 121/78 and pm 154/76, pulse 80 1/22 am 120/82, rechecked am 120/75, pm 120/69 1/23 am 123/79, pulse 73, mid-day 130/83 and pm 130/84 1/24 am 111/71, mid-day 119/78 and pm 136/80 1/25 am only 136/84, she got busy and did check pm. 1/26 am only 138/81 1/27 she has not checked bp today. She will check after she eats breakfast.

## 2019-06-09 NOTE — Telephone Encounter (Signed)
-----   Message from Heath Lark, MD sent at 06/09/2019  7:36 AM EST ----- Regarding: can you ask how is BP readings are doing? Make sure you get actual readings

## 2019-06-14 ENCOUNTER — Other Ambulatory Visit: Payer: Self-pay

## 2019-06-14 ENCOUNTER — Other Ambulatory Visit: Payer: Self-pay | Admitting: *Deleted

## 2019-06-14 ENCOUNTER — Inpatient Hospital Stay: Payer: Medicare Other | Attending: Hematology and Oncology

## 2019-06-14 ENCOUNTER — Inpatient Hospital Stay (HOSPITAL_BASED_OUTPATIENT_CLINIC_OR_DEPARTMENT_OTHER): Payer: Medicare Other | Admitting: Hematology and Oncology

## 2019-06-14 ENCOUNTER — Inpatient Hospital Stay: Payer: Medicare Other

## 2019-06-14 ENCOUNTER — Encounter: Payer: Self-pay | Admitting: Hematology and Oncology

## 2019-06-14 DIAGNOSIS — Z8673 Personal history of transient ischemic attack (TIA), and cerebral infarction without residual deficits: Secondary | ICD-10-CM | POA: Insufficient documentation

## 2019-06-14 DIAGNOSIS — Z5112 Encounter for antineoplastic immunotherapy: Secondary | ICD-10-CM | POA: Insufficient documentation

## 2019-06-14 DIAGNOSIS — J984 Other disorders of lung: Secondary | ICD-10-CM | POA: Insufficient documentation

## 2019-06-14 DIAGNOSIS — Z7189 Other specified counseling: Secondary | ICD-10-CM

## 2019-06-14 DIAGNOSIS — Z923 Personal history of irradiation: Secondary | ICD-10-CM | POA: Insufficient documentation

## 2019-06-14 DIAGNOSIS — G459 Transient cerebral ischemic attack, unspecified: Secondary | ICD-10-CM | POA: Diagnosis not present

## 2019-06-14 DIAGNOSIS — Z95828 Presence of other vascular implants and grafts: Secondary | ICD-10-CM

## 2019-06-14 DIAGNOSIS — I2510 Atherosclerotic heart disease of native coronary artery without angina pectoris: Secondary | ICD-10-CM | POA: Insufficient documentation

## 2019-06-14 DIAGNOSIS — Z885 Allergy status to narcotic agent status: Secondary | ICD-10-CM | POA: Insufficient documentation

## 2019-06-14 DIAGNOSIS — J320 Chronic maxillary sinusitis: Secondary | ICD-10-CM | POA: Diagnosis not present

## 2019-06-14 DIAGNOSIS — Z79899 Other long term (current) drug therapy: Secondary | ICD-10-CM | POA: Insufficient documentation

## 2019-06-14 DIAGNOSIS — I6782 Cerebral ischemia: Secondary | ICD-10-CM | POA: Insufficient documentation

## 2019-06-14 DIAGNOSIS — C7951 Secondary malignant neoplasm of bone: Secondary | ICD-10-CM | POA: Diagnosis not present

## 2019-06-14 DIAGNOSIS — T451X5A Adverse effect of antineoplastic and immunosuppressive drugs, initial encounter: Secondary | ICD-10-CM

## 2019-06-14 DIAGNOSIS — K8020 Calculus of gallbladder without cholecystitis without obstruction: Secondary | ICD-10-CM | POA: Insufficient documentation

## 2019-06-14 DIAGNOSIS — R222 Localized swelling, mass and lump, trunk: Secondary | ICD-10-CM | POA: Diagnosis not present

## 2019-06-14 DIAGNOSIS — C541 Malignant neoplasm of endometrium: Secondary | ICD-10-CM

## 2019-06-14 DIAGNOSIS — C3431 Malignant neoplasm of lower lobe, right bronchus or lung: Secondary | ICD-10-CM | POA: Diagnosis not present

## 2019-06-14 DIAGNOSIS — Z853 Personal history of malignant neoplasm of breast: Secondary | ICD-10-CM

## 2019-06-14 DIAGNOSIS — K5730 Diverticulosis of large intestine without perforation or abscess without bleeding: Secondary | ICD-10-CM | POA: Diagnosis not present

## 2019-06-14 DIAGNOSIS — D701 Agranulocytosis secondary to cancer chemotherapy: Secondary | ICD-10-CM

## 2019-06-14 DIAGNOSIS — I10 Essential (primary) hypertension: Secondary | ICD-10-CM

## 2019-06-14 DIAGNOSIS — C549 Malignant neoplasm of corpus uteri, unspecified: Secondary | ICD-10-CM

## 2019-06-14 DIAGNOSIS — Z171 Estrogen receptor negative status [ER-]: Secondary | ICD-10-CM | POA: Diagnosis not present

## 2019-06-14 LAB — CMP (CANCER CENTER ONLY)
ALT: 28 U/L (ref 0–44)
AST: 32 U/L (ref 15–41)
Albumin: 3.8 g/dL (ref 3.5–5.0)
Alkaline Phosphatase: 63 U/L (ref 38–126)
Anion gap: 11 (ref 5–15)
BUN: 24 mg/dL — ABNORMAL HIGH (ref 8–23)
CO2: 26 mmol/L (ref 22–32)
Calcium: 9.3 mg/dL (ref 8.9–10.3)
Chloride: 106 mmol/L (ref 98–111)
Creatinine: 0.91 mg/dL (ref 0.44–1.00)
GFR, Est AFR Am: >60 mL/min (ref >60)
GFR, Estimated: >60 mL/min (ref >60)
Glucose, Bld: 91 mg/dL (ref 70–99)
Potassium: 3.7 mmol/L (ref 3.5–5.1)
Sodium: 143 mmol/L (ref 135–145)
Total Bilirubin: 0.5 mg/dL (ref 0.3–1.2)
Total Protein: 7.2 g/dL (ref 6.5–8.1)

## 2019-06-14 LAB — CBC WITH DIFFERENTIAL (CANCER CENTER ONLY)
Abs Immature Granulocytes: 0.03 10*3/uL (ref 0.00–0.07)
Basophils Absolute: 0.1 10*3/uL (ref 0.0–0.1)
Basophils Relative: 1 %
Eosinophils Absolute: 0.5 10*3/uL (ref 0.0–0.5)
Eosinophils Relative: 8 %
HCT: 36.3 % (ref 36.0–46.0)
Hemoglobin: 12.6 g/dL (ref 12.0–15.0)
Immature Granulocytes: 1 %
Lymphocytes Relative: 20 %
Lymphs Abs: 1.2 10*3/uL (ref 0.7–4.0)
MCH: 33.9 pg (ref 26.0–34.0)
MCHC: 34.7 g/dL (ref 30.0–36.0)
MCV: 97.6 fL (ref 80.0–100.0)
Monocytes Absolute: 1 10*3/uL (ref 0.1–1.0)
Monocytes Relative: 15 %
Neutro Abs: 3.5 10*3/uL (ref 1.7–7.7)
Neutrophils Relative %: 55 %
Platelet Count: 165 10*3/uL (ref 150–400)
RBC: 3.72 MIL/uL — ABNORMAL LOW (ref 3.87–5.11)
RDW: 12.4 % (ref 11.5–15.5)
WBC Count: 6.3 10*3/uL (ref 4.0–10.5)
nRBC: 0 % (ref 0.0–0.2)

## 2019-06-14 LAB — TSH: TSH: 3.092 u[IU]/mL (ref 0.308–3.960)

## 2019-06-14 LAB — TOTAL PROTEIN, URINE DIPSTICK: Protein, ur: NEGATIVE mg/dL

## 2019-06-14 MED ORDER — HEPARIN SOD (PORK) LOCK FLUSH 100 UNIT/ML IV SOLN
500.0000 [IU] | Freq: Once | INTRAVENOUS | Status: AC | PRN
  Administered 2019-06-14: 10:00:00 500 [IU]
  Filled 2019-06-14: qty 5

## 2019-06-14 MED ORDER — SODIUM CHLORIDE 0.9 % IV SOLN
200.0000 mg | Freq: Once | INTRAVENOUS | Status: AC
  Administered 2019-06-14: 200 mg via INTRAVENOUS
  Filled 2019-06-14: qty 8

## 2019-06-14 MED ORDER — SODIUM CHLORIDE 0.9% FLUSH
10.0000 mL | INTRAVENOUS | Status: DC | PRN
  Administered 2019-06-14: 10:00:00 10 mL
  Filled 2019-06-14: qty 10

## 2019-06-14 MED ORDER — SODIUM CHLORIDE 0.9 % IV SOLN
Freq: Once | INTRAVENOUS | Status: AC
  Filled 2019-06-14: qty 250

## 2019-06-14 MED ORDER — SODIUM CHLORIDE 0.9% FLUSH
10.0000 mL | Freq: Once | INTRAVENOUS | Status: AC
  Administered 2019-06-14: 10 mL
  Filled 2019-06-14: qty 10

## 2019-06-14 NOTE — Assessment & Plan Note (Signed)
She have no further symptoms or reoccurrence We will aggressively manage her blood pressure

## 2019-06-14 NOTE — Patient Instructions (Signed)
[facility] Discharge Instructions for Patients Receiving Chemotherapy  Today you received the following chemotherapy agents: pembrolizumab.  To help prevent nausea and vomiting after your treatment, we encourage you to take your nausea medication as directed.   If you develop nausea and vomiting that is not controlled by your nausea medication, call the clinic.   BELOW ARE SYMPTOMS THAT SHOULD BE REPORTED IMMEDIATELY:  *FEVER GREATER THAN 100.5 F  *CHILLS WITH OR WITHOUT FEVER  NAUSEA AND VOMITING THAT IS NOT CONTROLLED WITH YOUR NAUSEA MEDICATION  *UNUSUAL SHORTNESS OF BREATH  *UNUSUAL BRUISING OR BLEEDING  TENDERNESS IN MOUTH AND THROAT WITH OR WITHOUT PRESENCE OF ULCERS  *URINARY PROBLEMS  *BOWEL PROBLEMS  UNUSUAL RASH Items with * indicate a potential emergency and should be followed up as soon as possible.  Feel free to call the clinic should you have any questions or concerns. The clinic phone number is [phone].  Please show the CHEMO ALERT CARD at check-in to the Emergency Department and triage nurse.   

## 2019-06-14 NOTE — Assessment & Plan Note (Signed)
Her blood pressure control at home is satisfactory Urine protein is pending She will continue current blood pressure prescription for now

## 2019-06-14 NOTE — Assessment & Plan Note (Signed)
So far, she tolerated treatment well without side effects She tolerated pembrolizumab without infusion reaction She tolerated lenvatinib better with aggressive blood pressure control We will proceed with treatment as scheduled I will see her again in 3 weeks of which at the time we will plan CT imaging in March

## 2019-06-14 NOTE — Progress Notes (Signed)
[place] OFFICE PROGRESS NOTE  Patient Care Team: Shon Baton, MD as PCP - General (Internal Medicine) Heath Lark, MD as Consulting Physician (Hematology and Oncology)  ASSESSMENT & PLAN:  Endometrial cancer [facility]) So far, she tolerated treatment well without side effects She tolerated pembrolizumab without infusion reaction She tolerated lenvatinib better with aggressive blood pressure control We will proceed with treatment as scheduled I will see her again in 3 weeks of which at the time we will plan CT imaging in March  TIA (transient ischemic attack) She have no further symptoms or reoccurrence We will aggressively manage her blood pressure  Essential hypertension Her blood pressure control at home is satisfactory Urine protein is pending She will continue current blood pressure prescription for now   No orders of the defined types were placed in this encounter.   All questions were answered. The patient knows to call the clinic with any problems, questions or concerns. The total time spent in the appointment was 20 minutes encounter with patients including review of chart and various tests results, discussions about plan of care and coordination of care plan   Heath Lark, MD 06/14/2019 9:03 AM  INTERVAL HISTORY: Please see below for problem oriented charting. She returns for treatment and follow-up Her blood pressure control at home is satisfactory except for a few occasional systolic blood pressure over 140 Her heart rate ranges around the mid 70s She has no further TIA-like symptoms No recent infusion reaction No recent infection, fever or chills Her appetite is stable She denies pain  SUMMARY OF ONCOLOGIC HISTORY: Oncology History Overview Note  Biopsy of recurrence 06-2014 ER negative. PDL1 testing low at 2% on the uterine cancer She progressed on carboplatin and Paclitaxel for uterine cancer HER 2 negative on pathology [id number] Negative  genetic testing  On her lung cancer sample, [employer] [facility] testing revealed 10% PD-1 positive EGFR mutation was detected, exon 19 deletion   Endometrial cancer ([place])  12/01/2007 Initial Diagnosis   She has recurrent papillary serous endometrial carcinoma. Briefly she was diagnosed in 2009 with a FIGO IB pappilary serous carcinoma treated with hysterectomy followed by adjuvant chemo and vaginal brachytherapy. She then had a recurrence diagnosed in late 2012 that was deemed not to be surgically resectable, and was treated with 6 cycles of carbo/taxol followed by 5040 cGy of EBRT to the pelvic mass. She was then followed with serial imaging and was noted in June of this year to have slight increase in the size of the mass, and again in September there was small enlargement of the mass. She had a re-staging PET scan on 03/10/13 that showed FDG activity in the pelvic mass, with no other areas of disease   12/01/2007 Imaging   CT scan of chest, abdomen and pelvis: 1.  Mass like area of decreased attenuation in the central uterus, consistent with the known endometrial carcinoma.  Deep myometrial invasion is suspected.  Pelvic MRI without and with contrast could be performed for further staging workup if clinically warranted. 2.  No evidence of extrauterine extension or lymphadenopathy. 3.  Sigmoid diverticulosis incidentally noted.   12/15/2007 Surgery   --12/2007 laparoscopic hysterectomy and PLND --05/2008 complted 6 cycles adjuvant carbo/taxol followed by vaginal cuff brachy --03/2011 exploratory lararoscopy, ureterolysis --4/13 completed 6 cycels carbotaxol --8/13 completed EBRT to the left pelvic sidewall    06/10/2008 Imaging   CT scan abdomen and pelvis 1.  Nonspecific mildly prominent inguinal lymph nodes, right greater than left. 2.  Interval  hysterectomy without evidence of recurrent pelvic mass or fluid collection.   03/22/2011 Imaging   Ct scan abdomen and pelvis 1.  Local uterine cancer  recurrence with two large nodes along the left pelvic sidewall. 2.  No evidence of bowel obstruction, urinary obstruction, or more distant metastasis.   03/31/2011 Relapse/Recurrence   chemotherapy and external beam radiation   05/16/2011 - 09/17/2011 Chemotherapy   She had 6 cycles of carboplatin and Taxol    07/17/2011 Imaging   Ct scan abdomen and pelvis 1.  Interval improvement in the previously demonstrated left pelvic local recurrence of tumor. 2.  No disease progression or complication identified. 3.  Mild bladder wall thickening on the right, nonspecific and possibly related to incomplete distension and/or radiation therapy. 4.  Cholelithiasis   10/11/2011 Imaging   CT scan abdomen and pelvis 1.  Interval enlargement of the left pelvic sidewall masses. 2.  No new nodular disease in the pelvis or lymphadenopathy. 3.  No evidence  of distant metastasis. 4.  Mild hydronephrosis on the right is similar to prior. 5.  Focal thickening of the right aspect the bladder is stable compared to prior.    10/28/2011 - 12/05/2011 Radiation Therapy   radiation for pelvic sidewall recurrence   10/28/2011 - 12/05/2011 Radiation Therapy   10/28/11-12/05/11: Radiotherapy to the left pelvic sidewall   03/18/2012 Imaging   CT abdomen 1.  Today's study demonstrates a positive response to therapy with decreased size of left pelvic sidewall nodal masses, as detailed above.  No new soft tissue masses or new lymphadenopathy is identified within the abdomen or pelvis. Continue attention on follow-up studies is recommended. 2.  Cholelithiasis without findings to suggest acute cholecystitis. 3.  Colonic diverticulosis without findings to suggest acute diverticulitis at this time. 4.  Mild asymmetric urinary bladder wall thickening is unchanged compared to prior examinations and is nonspecific.  No definite bladder wall mass is identified at this time. 5.  Additional findings, similar to prior examinations, as  above   06/22/2012 Imaging   CT abdomen 1.  Further decreased size of the left pelvic peripherally enhancing lesion. 2.  No new sites of new or progressive disease. 3. Esophageal air fluid level suggests dysmotility or gastroesophageal reflux. 4.  Cholelithiasis   11/02/2012 Imaging   CT abdomen 1.  The left pelvic side wall lesion demonstrates mild increase in size from previous exam. 2.  No new sites of new or progressive disease. 3.  Cholelithiasis.    01/28/2013 Imaging   CT abdomen 1. Interval small increase in volume of enhancing mass adjacent to the left pelvic sidewall. 2. No evidence of abdominal or pelvic lymphadenopathy   04/02/2013 Surgery   pelvic mass resection with IORT   04/02/2013 - 04/02/2013 Radiation Therapy   04/02/13: Intraoperative radiotherapy to the left pelvis   05/10/2013 PET scan   1. Hypermetabolic mass in the deep left pelvis consistent with metastasis. 2. No additional evidence of local metastasis. No evidence of distant metastasis. 3. Small right lower lobe pulmonary nodule is not hypermetabolic.   07/27/2013 Imaging   No evidence of recurrent or metastatic disease. Apparent prior resection of the deep left pelvic metastasis.   02/08/2014 Imaging   No CT findings for recurrent or metastatic disease involving the abdomen/ pelvis   06/13/2014 Relapse/Recurrence   + recurrence paraspinous muscle   06/21/2014 Procedure   There is a soft tissue lesion along the left side of L3-L4. This lesion roughly measures up to 2.2 cm. Needle  was positioned along the posterior aspect of the lesion. Small amount of air in the paraspinal tissues following needle removal   06/21/2014 Pathology Results   Bone, biopsy, left lumbar paraspinal - METASTATIC POORLY DIFFERENTIATED CARCINOMA, CONSISTENT WITH HIGH GRADE SEROUS CARCINOMA SEE COMMENT. Microscopic Comment The carcinoma demonstrates the following immunophenotype: Cytokeratin 7 - patchy moderate to strong  expression Estrogen receptor - negative expression P53 - strong diffuse expression TTF-1 - negative expression WT-1 - negative expression CD56 - focal moderate strong expression Synaptophysin - negative expression GCDFP - negative expression The history of primary endometrial papillary serous carcinoma and primary mammary carcinoma is noted. In the current case, the overall morphologic and immunophenotype are that of poorly differentiated carcinoma, consistent with high grade serous carcinoma.    07/11/2014 - 08/12/2014 Radiation Therapy   07/11/14-08/12/14: 55 Gy in 25 fractions to the Lumbar spine   08/15/2014 Imaging   CT scan of chest, abdomen and pelvis 1. Since the biopsy study of 06/21/2014, similar to slight decrease in size of a left paraspinous lesion at L3-4. 2. No new sites of metastatic disease identified. 3. 8 mm ground-glass nodule in the right lower lobe is grossly similar to 03/10/2013, suggesting a benign etiology. 4. Cholelithiasis. 5. Apparent sigmoid colonic wall thickening which could be due to underdistention. Colitis felt less likely. 6.  Atherosclerosis, including within the coronary arteries. 7. Pelvic floor laxity.   10/07/2014 Imaging   CT scan of chest, abdomen and pelvis: Stable to slight interval decrease in size of left paraspinous lesion at L3-4. No new sites of metastatic disease. Unchanged ground-glass nodule within the right lower lobe, potentially benign in etiology. Recommend attention on followup. Cholelithiasis. Sigmoid colonic diverticulosis. No CT evidence to suggest acute diverticulitis.   01/19/2015 Imaging   Ct scan of abdomen and pelvis 1. Decrease in size of left paraspinous metastasis. 2. No new sites of disease. 3. Stable ground-glass attenuating nodule within the superior segment of right lower lobe. 4. Gallstones   05/02/2015 Imaging   Ct abdomen and pelvis: Slight interval increase in size of left lumbar paraspinal soft tissue metastasis. No  new sites of metastatic disease identified within the abdomen or pelvis.    05/11/2015 PET scan   1. The small soft tissue mass just lateral to the left L3 for neural foramen is hypermetabolic, favoring malignancy. 2. There is also a small focus of hypermetabolic activity between the left L1 and L2 transverse processes, but without a CT correlate. 3. The 13 mm sub solid nodule in the superior segment right lower lobe is stable from recent exams but has slowly increased in size over the last 7 years. Although not hypermetabolic, the appearance is concerning for the possibility of a low grade adenocarcinoma. 4. Other imaging findings of potential clinical significance: Chronic bilateral maxillary sinusitis. Coronary, aortic arch, and branch vessel atherosclerotic vascular disease. Aortoiliac atherosclerotic vascular disease. Cholelithiasis. Sigmoid colon diverticulosis. Lumbar scoliosis.   07/11/2015 Imaging   Ct scan of chest, abdomen and pelvis: 1. 13 mm mixed solid and sub solid nodule in the posterior right lower lobe was present in 2009 and has clearly progressed in the interval since that study. Imaging features remain highly concerning for low-grade or well differentiated adenocarcinoma. 2. Slight increase size of in the hypermetabolic, rim enhancing nodule identified adjacent to the left L3-4 neural foramen. Metastatic disease remains a concern. 3. No evidence for discrete soft tissue lesion between the left L1 and 2 transverse processes, the site of focal FDG uptake on  the recent PET-CT. 4. Cholelithiasis. 5. Abdominal aortic atherosclerosis   08/03/2015 - 01/16/2016 Chemotherapy   She received carboplatin and Taxol   11/06/2015 PET scan   1. Hypermetabolic left D6-3 paraspinous metastasis (biopsy-proven) is stable in size and slightly decreased in metabolism. 2. Previously described small focus of hypermetabolism between the left L2 and L3 spinous processes has resolved. 3. New mild linear  hypermetabolism to the left of the T11-12 spinous processes without discrete mass on the CT images, favor benign activity related activity, recommend attention on follow-up PET-CT.  4. No definite new sites of hypermetabolic metastatic disease. No recurrent hypermetabolic metastatic disease in the pelvis. 5. Interval stability of subsolid 1.3 cm superior segment right lower lobe pulmonary nodule without associated significant metabolism, which has grown compared to the 2009 chest CT study, and remain suspicious for low grade adenocarcinoma. 6. Additional findings include aortic atherosclerosis, coronary atherosclerosis, mild multinodular goiter with no hypermetabolic thyroid nodules, cholelithiasis and moderate sigmoid diverticulosis.   02/12/2016 PET scan   1. Interval increase in size and metabolic activity of the LEFT paraspinal soft tissue metastasis. 2. New activity within the musculature of the LEFT chest wall. Given the unusual location of the paraspinal metastasis cannot exclude a second soft tissue metastasis to the musculature however favor benign physiologic activity. 3. Stable RIGHT lower lobe pulmonary nodule. 4. No evidence of local recurrence.     04/08/2016 Imaging   Ct scan of chest, abdomen and pelvis: 1. Similar size of a  left paravertebral abdominal lesion. 2. No new or progressive metastatic disease. 3. No correlate for the muscular activity about the lateral left chest wall. 4.  Coronary artery atherosclerosis. Aortic atherosclerosis. 5. Cholelithiasis. 6. Similar right lower lobe pulmonary nodule.   06/03/2016 Imaging   CT abdomen and pelvis 1. Similar to mild enlargement of a paravertebral soft tissue lesion at the L3-4 level. 2. No new sites of disease identified. 3. Cholelithiasis. 4. Hysterectomy. 5.  Aortic atherosclerosis.    09/02/2016 Imaging   CT abdomen and pelvis 1. Continue mild interval increase in size of LEFT paraspinal mass (1-2 mm). 2. No  evidence of new metastatic disease in the abdomen pelvis. 3. Post hysterectomy anatomy   09/26/2016 Imaging   MR lumbar spine 1. Left paraspinal metastasis with epicenter adjacent to the left L3 neural foramen does extend into the foramen to the level of the left lateral epidural space (series 9, image 31), but also tracks cephalad and caudal along the L2-L4 lumbar plexus (series 10, image 15). No extension into the left L2 or L4 neural foramina. And no more distal extension of tumor. 2. Abnormal signal in the medial left psoas and ventral left erector spinae muscles at L2 and L3 is probably denervation related. 3. No bone invasion or osseous metastatic disease. Suspect previous radiation of the L2 through L5 spinal levels. 4. No dural or intradural metastatic disease.    10/22/2016 Procedure   She underwent stereotactic Radiosurgery   10/30/2016 Genetic Testing   Patient has genetic testing done for Inheritable genetic mutation panel. Results revealed patient has no actionable mutation.   01/21/2017 Imaging   1. Reduced size and conspicuity of the left paraspinal mass at the L3-4 level. The mass still present but has enhancement similar to that of adjacent psoas musculature. 2. No new metastatic disease is identified. 3. Other imaging findings of potential clinical significance: Cholelithiasis. Aortic Atherosclerosis (ICD10-I70.0). Sigmoid diverticulosis. Lumbar scoliosis.   05/09/2017 Imaging   Ct scan of abdomen and pelvis  1. Paraspinal mass adjacent to the left side of L3-L4 is less distinct than prior examinations, and is therefore difficult to discretely measure, however, the overall appearance suggests continued positive response to therapy. 2. No new signs of metastatic disease elsewhere in the abdomen or pelvis. 3. Aortic atherosclerosis, in addition to at least right coronary artery disease. Assessment for potential risk factor modification, dietary therapy or pharmacologic therapy  may be warranted, if clinically indicated. 4. Colonic diverticulosis without evidence of acute diverticulitis at this time. 5. Additional incidental findings, as above. Aortic Atherosclerosis (ICD10-I70.0).   08/14/2017 Imaging   1. Treated left paraspinal mass centered at L3-4. Mass size is stable but there has been some evolution of tumor characteristics with less central fluid seen today. Recommend continued surveillance. 2. No evidence of untreated metastasis. 3. Degenerative changes and related impingement are described above.   10/28/2017 Imaging   Stable small left paraspinal soft tissue mass. No new or progressive disease within the abdomen or pelvis.  Cholelithiasis.  No radiographic evidence of cholecystitis.  Colonic diverticulosis, without radiographic evidence of diverticulitis.   04/30/2018 Imaging   Stable post treatment change of the partially necrotic LEFT paravertebral mass at L3-L4. 18 x 21 mm cross-section. Mass effect on the LEFT L3 nerve root redemonstrated. Enhancing and atrophic LEFT psoas is inseparable.   10/15/2018 Imaging   1. Stable post treatment size and appearance of partially necrotic left paravertebral mass at L3-4, measuring 19 x 25 mm on current exam (unchanged when measured at similar level on previous study). Mass effect on the adjacent left L3 nerve root is unchanged. Adjacent enhancing and atrophic left psoas muscle. 2. Left convex scoliosis with associated multilevel degenerative spondylolysis and facet arthrosis, unchanged   10/29/2018 Imaging   Stable small left L3 paraspinal soft tissue mass. No evidence of new or progressive metastatic disease, or other acute findings.   Cholelithiasis.  No radiographic evidence of cholecystitis.   Colonic diverticulosis, without radiographic evidence of diverticulitis.   11/11/2018 PET scan   1. Low level metabolism (max SUV 2.0) associated with the irregular subsolid superior segment right lower lobe 2.1 cm  pulmonary nodule. As better depicted on the recent diagnostic chest CT study, this nodule crosses the major fissure into the right upper lobe and has increased in size and density on multiple imaging studies back to 2009. Findings are most suggestive of primary bronchogenic adenocarcinoma. 2. No hypermetabolic thoracic adenopathy. 3. Persistent hypermetabolism (max SUV 8.0) associated with the known left L3-4 paraspinous metastasis, mildly decreased in metabolism since 02/12/2016 PET-CT. 4. No additional sites of hypermetabolic metastatic disease in the abdomen or pelvis. No metabolic evidence of peritoneal recurrence. No ascites. 5. Chronic findings include: Aortic Atherosclerosis (ICD10-I70.0). Coronary atherosclerosis. Chronic bilateral maxillary sinusitis. Cholelithiasis. Moderate sigmoid diverticulosis.   01/07/2019 - 04/04/2019 Chemotherapy   The patient had carboplatin and Alimta for chemotherapy treatment.     05/03/2019 -  Chemotherapy   The patient had Pembrolizumab and Lenvima for chemotherapy treatment.     Metastatic cancer to bone ([place])  06/27/2014 Initial Diagnosis   Metastatic cancer to bone   Primary lung cancer, right ([place])  11/04/2018 Initial Diagnosis   Primary lung cancer, right ([place])   11/11/2018 PET scan   1. Low level metabolism (max SUV 2.0) associated with the irregular subsolid superior segment right lower lobe 2.1 cm pulmonary nodule. As better depicted on the recent diagnostic chest CT study, this nodule crosses the major fissure into the right upper lobe and has increased  in size and density on multiple imaging studies back to 2009. Findings are most suggestive of primary bronchogenic adenocarcinoma. 2. No hypermetabolic thoracic adenopathy. 3. Persistent hypermetabolism (max SUV 8.0) associated with the known left L3-4 paraspinous metastasis, mildly decreased in metabolism since 02/12/2016 PET-CT. 4. No additional sites of hypermetabolic metastatic disease in the  abdomen or pelvis. No metabolic evidence of peritoneal recurrence. No ascites. 5. Chronic findings include: Aortic Atherosclerosis (ICD10-I70.0). Coronary atherosclerosis. Chronic bilateral maxillary sinusitis. Cholelithiasis. Moderate sigmoid diverticulosis.   12/07/2018 Pathology Results   1. Lung, resection (segmental or lobe), Right Lobe Superior with endblock wedge of right upper lobe - INVASIVE ADENOCARCINOMA, MODERATELY DIFFERENTIATED, SPANNING 1.6 CM. - THE SURGICAL RESECTION MARGINS ARE NEGATIVE FOR CARCINOMA. - SEE ONCOLOGY TABLE BELOW. 2. Lymph node, biopsy, Level 9 #1 - THERE IS NO EVIDENCE OF CARCINOMA IN 1 OF 1 LYMPH NODE (0/1). 3. Lymph node, biopsy, Level 9 #2 - THERE IS NO EVIDENCE OF CARCINOMA IN 1 OF 1 LYMPH NODE (0/1). 4. Lymph node, biopsy, Level 7 #1 - METASTATIC CARCINOMA IN 1 OF 1 LYMPH NODE (1/1). 5. Lymph node, biopsy, Level 7 #2 - THERE IS NO EVIDENCE OF CARCINOMA IN 1 OF 1 LYMPH NODE (0/1). 6. Lymph node, biopsy, Level 12 #1 - THERE IS NO EVIDENCE OF CARCINOMA IN 1 OF 1 LYMPH NODE (0/1). 7. Lymph node, biopsy, Level 12 #2 - THERE IS NO EVIDENCE OF CARCINOMA IN 1 OF 1 LYMPH NODE (0/1). 8. Lymph node, biopsy, Level 10 #1 - THERE IS NO EVIDENCE OF CARCINOMA IN 1 OF 1 LYMPH NODE (0/1). 9. Lymph node, biopsy, Level 4R #1 - THERE IS NO EVIDENCE OF CARCINOMA IN 1 OF 1 LYMPH NODE (0/1). 10. Lymph node, biopsy, Level 4R #2 - THERE IS NO EVIDENCE OF CARCINOMA IN 1 OF 1 LYMPH NODE (0/1). 11. Lymph node, biopsy, Level 2R #1 - THERE IS NO EVIDENCE OF CARCINOMA IN 1 OF 1 LYMPH NODE (0/1). 12. Lung, resection (segmental or lobe), Right Lobe Superior - BENIGN LUNG PARENCHYMA. - THERE IS NO EVIDENCE OF MALIGNANCY. Procedure: Segmentectomy. Specimen Laterality: Right. Tumor Site: Right superior lobe. Tumor Size: 1.6 cm (gross measurement). Tumor Focality: Unifocal. Histologic Type: Adenocarcinoma, moderately differentiated. Visceral Pleura Invasion: Not  identified. Lymphovascular Invasion: Not identified. Direct Invasion of Adjacent Structures: Not identified. Margins: Negative for carcinoma. Treatment Effect: N/A Regional Lymph Nodes: Number of Lymph Nodes Involved: 1 (level 7) Number of Lymph Nodes Examined: 10 Pathologic Stage Classification (pTNM, AJCC 8th Edition): pT1b, pN2 Ancillary Studies: The tumor cells are positive for Napsin-A, TTF-1, and p53. They are essentially negative for estrogen receptor, PAX-8 and progesterone receptor. Additional studies can be performed upon clinician request. Representative Tumor Block: 1D-1E. (JBK:gt, 12/09/18)   12/07/2018 Surgery   PREOPERATIVE DIAGNOSIS:  Right lung nodule.   POSTOPERATIVE DIAGNOSIS:  Non-small cell carcinoma- suspected primary lung carcinoma, clinical stage T2N01b.   PROCEDURE:   Right video-assisted thoracoscopy, Right lower lobe superior segmentectomy with en bloc wedge resection of right upper lobe, Lymph node dissection, Intercostal nerve block levels 3-9.   SURGEON:  Modesto Charon, MD   FINDINGS:  Mass palpable in posterior aspect of superior segment of right lower lobe, likely involvement across fissure into the upper lobe.  Frozen section revealed non-small cell carcinoma. upper and lower lobe margins clear.   12/23/2018 Cancer Staging   Staging form: Lung, AJCC 8th Edition - Pathologic: Stage IIIA (pT1b, pN2, cM0) - Signed by Heath Lark, MD on 12/23/2018   01/05/2019 Imaging  MRI brain 1. No metastatic disease or acute intracranial abnormality identified. 2. Small chronic parafalcine meningioma size is stable since that described in 2006 (12 x 15 mm). 3. Advanced signal changes in the cerebral white matter and to a lesser extent pons, nonspecific but most commonly due to chronic small vessel disease. 4. Partially visible degenerative cervical spinal stenosis.     01/07/2019 -  Chemotherapy   The patient had carboplatin and Alimta for chemotherapy  treatment.     04/14/2019 PET scan   1. 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 As shown on recent MRI of 04/01/2019, there is a new component of invasion into the left lower L3 vertebral body. This new component has a maximum SUV of 10.0, compatible with active malignancy. 2. Interval wedge resection of portions of the right upper lobe and right lower lobe at the site of the prior nodule. There is only minimal low-grade activity along the wedge resection clips, maximum SUV of 1.5. Surveillance is likely warranted. No new nodule identified. 3. Other imaging findings of potential clinical significance: Aortic Atherosclerosis (ICD10-I70.0). Coronary atherosclerosis. Thoracolumbar scoliosis. Cholelithiasis. Sigmoid colon diverticulosis.     05/03/2019 -  Chemotherapy   The patient had Pembrolizumab and Lenvima for chemotherapy treatment.     05/22/2019 Imaging   Ct head Atrophy, chronic microvascular disease.   No acute intracranial abnormality.       REVIEW OF SYSTEMS:   Constitutional: Denies fevers, chills or abnormal weight loss Eyes: Denies blurriness of vision Ears, nose, mouth, throat, and face: Denies mucositis or sore throat Respiratory: Denies cough, dyspnea or wheezes Cardiovascular: Denies palpitation, chest discomfort or lower extremity swelling Gastrointestinal:  Denies nausea, heartburn or change in bowel habits Skin: Denies abnormal skin rashes Lymphatics: Denies new lymphadenopathy or easy bruising Neurological:Denies numbness, tingling or new weaknesses Behavioral/Psych: Mood is stable, no new changes  All other systems were reviewed with the patient and are negative.  I have reviewed the past medical history, past surgical history, social history and family history with the patient and they are unchanged from previous note.  ALLERGIES:  is allergic to ace inhibitors; dilaudid [hydromorphone hcl]; and codeine.  MEDICATIONS:  Current  Outpatient Medications  Medication Sig Dispense Refill  . amLODipine (NORVASC) 10 MG tablet Take 1 tablet (10 mg total) by mouth daily. 30 tablet 6  . aspirin EC 81 MG tablet Take 81 mg by mouth daily.    Marland Kitchen atenolol (TENORMIN) 25 MG tablet Take 25 mg by mouth 2 (two) times daily.     Marland Kitchen lenvatinib 10 mg daily dose (LENVIMA, 10 MG DAILY DOSE,) capsule Take 1 capsule (10 mg total) by mouth daily. 30 capsule 11  . lidocaine-prilocaine (EMLA) cream Apply to [facility] cath 1-2 hours prior to access as directed 30 g 1  . LORazepam (ATIVAN) 0.5 MG tablet 1 tablet po 30 minutes prior to radiation or MRI 30 tablet 0  . Multiple Vitamin (MULTIVITAMIN WITH MINERALS) TABS tablet Take 1 tablet by mouth daily.    . simvastatin (ZOCOR) 20 MG tablet Take 20 mg by mouth every evening.      No current facility-administered medications for this visit.   Facility-Administered Medications Ordered in Other Visits  Medication Dose Route Frequency Provider Last Rate Last Admin  . heparin lock flush 100 unit/mL  500 Units Intracatheter Once Liberato Stansbery, MD      . sodium chloride flush (NS) 0.9 % injection 10 mL  10 mL Intracatheter Once [place], Angelyna Henderson,  MD        PHYSICAL EXAMINATION: ECOG PERFORMANCE STATUS: 0 - Asymptomatic  Vitals:   06/14/19 0849  BP: (!) 156/69  Pulse: 77  Resp: 18  Temp: 97.8 F (36.6 C)  SpO2: 100%   Filed Weights   06/14/19 0849  Weight: 109 lb 6.4 oz (49.6 kg)    GENERAL:alert, no distress and comfortable SKIN: skin color, texture, turgor are normal, no rashes or significant lesions EYES: normal, Conjunctiva are pink and non-injected, sclera clear OROPHARYNX:no exudate, no erythema and lips, buccal mucosa, and tongue normal  NECK: supple, thyroid normal size, non-tender, without nodularity LYMPH:  no palpable lymphadenopathy in the cervical, axillary or inguinal LUNGS: clear to auscultation and percussion with normal breathing effort HEART: regular rate & rhythm and no murmurs and  no lower extremity edema ABDOMEN:abdomen soft, non-tender and normal bowel sounds Musculoskeletal:no cyanosis of digits and no clubbing  NEURO: alert & oriented x 3 with fluent speech, no focal motor/sensory deficits  LABORATORY DATA:  I have reviewed the data as listed    Component Value Date/Time   NA 142 05/24/2019 1055   NA 140 05/09/2017 0844   K 4.3 05/24/2019 1055   K 4.1 05/09/2017 0844   CL 104 05/24/2019 1055   CO2 26 05/24/2019 1055   CO2 26 05/09/2017 0844   GLUCOSE 83 05/24/2019 1055   GLUCOSE 82 05/09/2017 0844   BUN 25 (H) 05/24/2019 1055   BUN 20.7 05/09/2017 0844   CREATININE 1.05 (H) 05/24/2019 1055   CREATININE 0.7 05/09/2017 0844   CALCIUM 9.7 05/24/2019 1055   CALCIUM 9.3 05/09/2017 0844   PROT 7.5 05/24/2019 1055   PROT 6.8 05/09/2017 0844   ALBUMIN 4.0 05/24/2019 1055   ALBUMIN 3.6 05/09/2017 0844   AST 36 05/24/2019 1055   AST 27 05/09/2017 0844   ALT 23 05/24/2019 1055   ALT 17 05/09/2017 0844   ALKPHOS 62 05/24/2019 1055   ALKPHOS 43 05/09/2017 0844   BILITOT 0.8 05/24/2019 1055   BILITOT 0.81 05/09/2017 0844   GFRNONAA 50 (L) 05/24/2019 1055   GFRAA 58 (L) 05/24/2019 1055    No results found for: SPEP, UPEP  Lab Results  Component Value Date   WBC 6.3 06/14/2019   NEUTROABS 3.5 06/14/2019   HGB 12.6 06/14/2019   HCT 36.3 06/14/2019   MCV 97.6 06/14/2019   PLT 165 06/14/2019      Chemistry      Component Value Date/Time   NA 142 05/24/2019 1055   NA 140 05/09/2017 0844   K 4.3 05/24/2019 1055   K 4.1 05/09/2017 0844   CL 104 05/24/2019 1055   CO2 26 05/24/2019 1055   CO2 26 05/09/2017 0844   BUN 25 (H) 05/24/2019 1055   BUN 20.7 05/09/2017 0844   CREATININE 1.05 (H) 05/24/2019 1055   CREATININE 0.7 05/09/2017 0844      Component Value Date/Time   CALCIUM 9.7 05/24/2019 1055   CALCIUM 9.3 05/09/2017 0844   ALKPHOS 62 05/24/2019 1055   ALKPHOS 43 05/09/2017 0844   AST 36 05/24/2019 1055   AST 27 05/09/2017 0844   ALT 23  05/24/2019 1055   ALT 17 05/09/2017 0844   BILITOT 0.8 05/24/2019 1055   BILITOT 0.81 05/09/2017 0844       RADIOGRAPHIC STUDIES: I have personally reviewed the radiological images as listed and agreed with the findings in the report. CT HEAD WO CONTRAST  Result Date: 05/22/2019 CLINICAL DATA:  TIA, lip numbness  EXAM: CT HEAD WITHOUT CONTRAST TECHNIQUE: Contiguous axial images were obtained from the base of the skull through the vertex without intravenous contrast. COMPARISON:  MRI 01/05/2019 FINDINGS: Brain: There is atrophy and chronic small vessel disease changes. No acute intracranial abnormality. Specifically, no hemorrhage, hydrocephalus, mass lesion, acute infarction, or significant intracranial injury. Vascular: No hyperdense vessel or unexpected calcification. Skull: No acute calvarial abnormality. Sinuses/Orbits: No acute finding Other: None IMPRESSION: Atrophy, chronic microvascular disease. No acute intracranial abnormality. Electronically Signed   By: Rolm Baptise M.D.   On: 05/22/2019 16:32

## 2019-06-21 ENCOUNTER — Telehealth: Payer: Self-pay

## 2019-06-21 NOTE — Telephone Encounter (Signed)
Called for recent blood pressure readings.  She has not checked today's blood pressure. 2/7 am 124/70/ pulse 64 and pm 126/74 2/6 am only 138/70 pulse 60 2/5 am 143/90 and pm 114/71 2/4 am 139/78, pulse 58 only 2/3 am 124/67, pulse 64 and pm 126/74.

## 2019-06-21 NOTE — Telephone Encounter (Signed)
Looks good No change in treatment

## 2019-06-21 NOTE — Telephone Encounter (Signed)
Called and given below message. She verbalized understanding. 

## 2019-06-21 NOTE — Telephone Encounter (Signed)
Patient is approved for manufacturer assistance for Lenvima through [facility] 05/13/19-05/12/20.  First shipment was sent out to the patient 05/19/19.  [place] phone [phone]  [place] Patient [place] Phone [phone] Fax [phone] 06/21/2019 11:50 AM

## 2019-06-21 NOTE — Telephone Encounter (Signed)
She called and left a message to call her. She is not sure from the last visit in the office if she is supposed to continue [place]?  Called back and instructed to continue Lenvima indefinitely until Dr. Alvy Bimler tells her otherwise. Instructed to call the office if blood pressure is elevated before not taking the Lenvima. She verbalized understanding. Given [name] to call for a refill, she will take the last Lenvima Sunday. Ask her to call the office back if needed. She verbalized understanding.

## 2019-07-05 ENCOUNTER — Encounter: Payer: Self-pay | Admitting: Hematology and Oncology

## 2019-07-05 ENCOUNTER — Other Ambulatory Visit: Payer: Self-pay

## 2019-07-05 ENCOUNTER — Inpatient Hospital Stay: Payer: Medicare Other

## 2019-07-05 ENCOUNTER — Inpatient Hospital Stay (HOSPITAL_BASED_OUTPATIENT_CLINIC_OR_DEPARTMENT_OTHER): Payer: Medicare Other | Admitting: Hematology and Oncology

## 2019-07-05 VITALS — BP 162/71 | HR 66 | Temp 98.1°F | Resp 18 | Ht 59.0 in | Wt 109.8 lb

## 2019-07-05 DIAGNOSIS — C541 Malignant neoplasm of endometrium: Secondary | ICD-10-CM | POA: Diagnosis not present

## 2019-07-05 DIAGNOSIS — Z7189 Other specified counseling: Secondary | ICD-10-CM

## 2019-07-05 DIAGNOSIS — Z853 Personal history of malignant neoplasm of breast: Secondary | ICD-10-CM

## 2019-07-05 DIAGNOSIS — I10 Essential (primary) hypertension: Secondary | ICD-10-CM

## 2019-07-05 DIAGNOSIS — Z95828 Presence of other vascular implants and grafts: Secondary | ICD-10-CM

## 2019-07-05 DIAGNOSIS — Z5112 Encounter for antineoplastic immunotherapy: Secondary | ICD-10-CM | POA: Diagnosis not present

## 2019-07-05 DIAGNOSIS — C3431 Malignant neoplasm of lower lobe, right bronchus or lung: Secondary | ICD-10-CM | POA: Diagnosis not present

## 2019-07-05 DIAGNOSIS — Z171 Estrogen receptor negative status [ER-]: Secondary | ICD-10-CM | POA: Diagnosis not present

## 2019-07-05 DIAGNOSIS — C3491 Malignant neoplasm of unspecified part of right bronchus or lung: Secondary | ICD-10-CM | POA: Diagnosis not present

## 2019-07-05 DIAGNOSIS — C7951 Secondary malignant neoplasm of bone: Secondary | ICD-10-CM | POA: Diagnosis not present

## 2019-07-05 DIAGNOSIS — C549 Malignant neoplasm of corpus uteri, unspecified: Secondary | ICD-10-CM

## 2019-07-05 DIAGNOSIS — D701 Agranulocytosis secondary to cancer chemotherapy: Secondary | ICD-10-CM

## 2019-07-05 LAB — CMP (CANCER CENTER ONLY)
ALT: 40 U/L (ref 0–44)
AST: 42 U/L — ABNORMAL HIGH (ref 15–41)
Albumin: 3.7 g/dL (ref 3.5–5.0)
Alkaline Phosphatase: 56 U/L (ref 38–126)
Anion gap: 11 (ref 5–15)
BUN: 24 mg/dL — ABNORMAL HIGH (ref 8–23)
CO2: 26 mmol/L (ref 22–32)
Calcium: 9.4 mg/dL (ref 8.9–10.3)
Chloride: 107 mmol/L (ref 98–111)
Creatinine: 0.86 mg/dL (ref 0.44–1.00)
GFR, Est AFR Am: >60 mL/min (ref >60)
GFR, Estimated: >60 mL/min (ref >60)
Glucose, Bld: 97 mg/dL (ref 70–99)
Potassium: 4 mmol/L (ref 3.5–5.1)
Sodium: 144 mmol/L (ref 135–145)
Total Bilirubin: 0.6 mg/dL (ref 0.3–1.2)
Total Protein: 7.1 g/dL (ref 6.5–8.1)

## 2019-07-05 LAB — TSH: TSH: 2.844 u[IU]/mL (ref 0.308–3.960)

## 2019-07-05 LAB — CBC WITH DIFFERENTIAL (CANCER CENTER ONLY)
Abs Immature Granulocytes: 0.03 10*3/uL (ref 0.00–0.07)
Basophils Absolute: 0.1 10*3/uL (ref 0.0–0.1)
Basophils Relative: 1 %
Eosinophils Absolute: 0.3 10*3/uL (ref 0.0–0.5)
Eosinophils Relative: 6 %
HCT: 35.7 % — ABNORMAL LOW (ref 36.0–46.0)
Hemoglobin: 12.2 g/dL (ref 12.0–15.0)
Immature Granulocytes: 1 %
Lymphocytes Relative: 23 %
Lymphs Abs: 1.3 10*3/uL (ref 0.7–4.0)
MCH: 33.4 pg (ref 26.0–34.0)
MCHC: 34.2 g/dL (ref 30.0–36.0)
MCV: 97.8 fL (ref 80.0–100.0)
Monocytes Absolute: 0.9 10*3/uL (ref 0.1–1.0)
Monocytes Relative: 15 %
Neutro Abs: 3.2 10*3/uL (ref 1.7–7.7)
Neutrophils Relative %: 54 %
Platelet Count: 151 10*3/uL (ref 150–400)
RBC: 3.65 MIL/uL — ABNORMAL LOW (ref 3.87–5.11)
RDW: 12.3 % (ref 11.5–15.5)
WBC Count: 5.8 10*3/uL (ref 4.0–10.5)
nRBC: 0 % (ref 0.0–0.2)

## 2019-07-05 LAB — TOTAL PROTEIN, URINE DIPSTICK: Protein, ur: NEGATIVE mg/dL

## 2019-07-05 MED ORDER — SODIUM CHLORIDE 0.9 % IV SOLN
200.0000 mg | Freq: Once | INTRAVENOUS | Status: AC
  Administered 2019-07-05: 200 mg via INTRAVENOUS
  Filled 2019-07-05: qty 8

## 2019-07-05 MED ORDER — SODIUM CHLORIDE 0.9% FLUSH
10.0000 mL | INTRAVENOUS | Status: DC | PRN
  Administered 2019-07-05: 10 mL
  Filled 2019-07-05: qty 10

## 2019-07-05 MED ORDER — SODIUM CHLORIDE 0.9 % IV SOLN
Freq: Once | INTRAVENOUS | Status: AC
  Filled 2019-07-05: qty 250

## 2019-07-05 MED ORDER — SODIUM CHLORIDE 0.9% FLUSH
10.0000 mL | Freq: Once | INTRAVENOUS | Status: AC
  Administered 2019-07-05: 10 mL
  Filled 2019-07-05: qty 10

## 2019-07-05 MED ORDER — HEPARIN SOD (PORK) LOCK FLUSH 100 UNIT/ML IV SOLN
500.0000 [IU] | Freq: Once | INTRAVENOUS | Status: AC | PRN
  Administered 2019-07-05: 500 [IU]
  Filled 2019-07-05: qty 5

## 2019-07-05 NOTE — Assessment & Plan Note (Signed)
So far, she tolerated treatment well without side effects She tolerated pembrolizumab without infusion reaction She tolerated lenvatinib better with aggressive blood pressure control We will proceed with treatment as scheduled I will see her again in 3 weeks of which at the time we will plan PET CT scan in March

## 2019-07-05 NOTE — Progress Notes (Signed)
[facility] PROGRESS NOTE  Patient Care Team: Shon Baton, MD as PCP - General (Internal Medicine) Heath Lark, MD as Consulting Physician (Hematology and Oncology)  ASSESSMENT & PLAN:  Endometrial cancer [facility]) So far, she tolerated treatment well without side effects She tolerated pembrolizumab without infusion reaction She tolerated lenvatinib better with aggressive blood pressure control We will proceed with treatment as scheduled I will see her again in 3 weeks of which at the time we will plan PET CT scan in March  Primary lung cancer, right [facility]) The patient had multiple different cancer diagnosis I plan to order a PET CT scan for evaluation If insurance will not approve PET scan, then we will only switch to CT imaging Previously, her cancer was hard to diagnose and the treatment was based on abnormal PET activity noted on recent imaging  Essential hypertension Her blood pressure control at home is satisfactory Urine protein is negative The patient have significant whitecoat hypertension She will continue current blood pressure prescription for now   Orders Placed This Encounter  Procedures  . NM PET Image Restag (PS) Skull Base To Thigh    Standing Status:   Future    Standing Expiration Date:   07/04/2020    Order Specific Question:   If indicated for the ordered procedure, I authorize the administration of a radiopharmaceutical per Radiology protocol    Answer:   Yes    Order Specific Question:   Preferred imaging location?    Answer:   [facility]    Order Specific Question:   Radiology Contrast Protocol - do NOT remove file path    Answer:   [email or web address]    All questions were answered. The patient knows to call the clinic with any problems, questions or concerns. The total time spent in the appointment was 20 minutes encounter with patients including review of chart and various tests results, discussions  about plan of care and coordination of care plan   Heath Lark, MD 07/05/2019 3:11 PM  INTERVAL HISTORY: Please see below for problem oriented charting. She returns for treatment and follow-up According to the patient, her blood pressure control at home is satisfactory Systolic blood pressure is typically range from 107-1 43 She is not symptomatic She denies infusion reaction No recent back pain No recent shortness of breath, cough or wheezing with treatment  SUMMARY OF ONCOLOGIC HISTORY: Oncology History Overview Note  Biopsy of recurrence 06-2014 ER negative. PDL1 testing low at 2% on the uterine cancer She progressed on carboplatin and Paclitaxel for uterine cancer HER 2 negative on pathology [id number] Negative genetic testing  On her lung cancer sample, [employer] testing revealed 10% PD-1 positive EGFR mutation was detected, exon 19 deletion   Endometrial cancer ([place])  12/01/2007 Initial Diagnosis   She has recurrent papillary serous endometrial carcinoma. Briefly she was diagnosed in 2009 with a FIGO IB pappilary serous carcinoma treated with hysterectomy followed by adjuvant chemo and vaginal brachytherapy. She then had a recurrence diagnosed in late 2012 that was deemed not to be surgically resectable, and was treated with 6 cycles of carbo/taxol followed by 5040 cGy of EBRT to the pelvic mass. She was then followed with serial imaging and was noted in June of this year to have slight increase in the size of the mass, and again in September there was small enlargement of the mass. She had a re-staging PET scan on 03/10/13 that showed FDG activity in the pelvic mass, with no  other areas of disease   12/01/2007 Imaging   CT scan of chest, abdomen and pelvis: 1.  Mass like area of decreased attenuation in the central uterus, consistent with the known endometrial carcinoma.  Deep myometrial invasion is suspected.  Pelvic MRI without and with contrast could be performed for further  staging workup if clinically warranted. 2.  No evidence of extrauterine extension or lymphadenopathy. 3.  Sigmoid diverticulosis incidentally noted.   12/15/2007 Surgery   --12/2007 laparoscopic hysterectomy and PLND --05/2008 complted 6 cycles adjuvant carbo/taxol followed by vaginal cuff brachy --03/2011 exploratory lararoscopy, ureterolysis --4/13 completed 6 cycels carbotaxol --8/13 completed EBRT to the left pelvic sidewall    06/10/2008 Imaging   CT scan abdomen and pelvis 1.  Nonspecific mildly prominent inguinal lymph nodes, right greater than left. 2.  Interval hysterectomy without evidence of recurrent pelvic mass or fluid collection.   03/22/2011 Imaging   Ct scan abdomen and pelvis 1.  Local uterine cancer recurrence with two large nodes along the left pelvic sidewall. 2.  No evidence of bowel obstruction, urinary obstruction, or more distant metastasis.   03/31/2011 Relapse/Recurrence   chemotherapy and external beam radiation   05/16/2011 - 09/17/2011 Chemotherapy   She had 6 cycles of carboplatin and Taxol    07/17/2011 Imaging   Ct scan abdomen and pelvis 1.  Interval improvement in the previously demonstrated left pelvic local recurrence of tumor. 2.  No disease progression or complication identified. 3.  Mild bladder wall thickening on the right, nonspecific and possibly related to incomplete distension and/or radiation therapy. 4.  Cholelithiasis   10/11/2011 Imaging   CT scan abdomen and pelvis 1.  Interval enlargement of the left pelvic sidewall masses. 2.  No new nodular disease in the pelvis or lymphadenopathy. 3.  No evidence  of distant metastasis. 4.  Mild hydronephrosis on the right is similar to prior. 5.  Focal thickening of the right aspect the bladder is stable compared to prior.    10/28/2011 - 12/05/2011 Radiation Therapy   radiation for pelvic sidewall recurrence   10/28/2011 - 12/05/2011 Radiation Therapy   10/28/11-12/05/11: Radiotherapy to the left  pelvic sidewall   03/18/2012 Imaging   CT abdomen 1.  Today's study demonstrates a positive response to therapy with decreased size of left pelvic sidewall nodal masses, as detailed above.  No new soft tissue masses or new lymphadenopathy is identified within the abdomen or pelvis. Continue attention on follow-up studies is recommended. 2.  Cholelithiasis without findings to suggest acute cholecystitis. 3.  Colonic diverticulosis without findings to suggest acute diverticulitis at this time. 4.  Mild asymmetric urinary bladder wall thickening is unchanged compared to prior examinations and is nonspecific.  No definite bladder wall mass is identified at this time. 5.  Additional findings, similar to prior examinations, as above   06/22/2012 Imaging   CT abdomen 1.  Further decreased size of the left pelvic peripherally enhancing lesion. 2.  No new sites of new or progressive disease. 3. Esophageal air fluid level suggests dysmotility or gastroesophageal reflux. 4.  Cholelithiasis   11/02/2012 Imaging   CT abdomen 1.  The left pelvic side wall lesion demonstrates mild increase in size from previous exam. 2.  No new sites of new or progressive disease. 3.  Cholelithiasis.    01/28/2013 Imaging   CT abdomen 1. Interval small increase in volume of enhancing mass adjacent to the left pelvic sidewall. 2. No evidence of abdominal or pelvic lymphadenopathy   04/02/2013 Surgery  pelvic mass resection with IORT   04/02/2013 - 04/02/2013 Radiation Therapy   04/02/13: Intraoperative radiotherapy to the left pelvis   05/10/2013 PET scan   1. Hypermetabolic mass in the deep left pelvis consistent with metastasis. 2. No additional evidence of local metastasis. No evidence of distant metastasis. 3. Small right lower lobe pulmonary nodule is not hypermetabolic.   07/27/2013 Imaging   No evidence of recurrent or metastatic disease. Apparent prior resection of the deep left pelvic metastasis.    02/08/2014 Imaging   No CT findings for recurrent or metastatic disease involving the abdomen/ pelvis   06/13/2014 Relapse/Recurrence   + recurrence paraspinous muscle   06/21/2014 Procedure   There is a soft tissue lesion along the left side of L3-L4. This lesion roughly measures up to 2.2 cm. Needle was positioned along the posterior aspect of the lesion. Small amount of air in the paraspinal tissues following needle removal   06/21/2014 Pathology Results   Bone, biopsy, left lumbar paraspinal - METASTATIC POORLY DIFFERENTIATED CARCINOMA, CONSISTENT WITH HIGH GRADE SEROUS CARCINOMA SEE COMMENT. Microscopic Comment The carcinoma demonstrates the following immunophenotype: Cytokeratin 7 - patchy moderate to strong expression Estrogen receptor - negative expression P53 - strong diffuse expression TTF-1 - negative expression WT-1 - negative expression CD56 - focal moderate strong expression Synaptophysin - negative expression GCDFP - negative expression The history of primary endometrial papillary serous carcinoma and primary mammary carcinoma is noted. In the current case, the overall morphologic and immunophenotype are that of poorly differentiated carcinoma, consistent with high grade serous carcinoma.    07/11/2014 - 08/12/2014 Radiation Therapy   07/11/14-08/12/14: 55 Gy in 25 fractions to the Lumbar spine   08/15/2014 Imaging   CT scan of chest, abdomen and pelvis 1. Since the biopsy study of 06/21/2014, similar to slight decrease in size of a left paraspinous lesion at L3-4. 2. No new sites of metastatic disease identified. 3. 8 mm ground-glass nodule in the right lower lobe is grossly similar to 03/10/2013, suggesting a benign etiology. 4. Cholelithiasis. 5. Apparent sigmoid colonic wall thickening which could be due to underdistention. Colitis felt less likely. 6.  Atherosclerosis, including within the coronary arteries. 7. Pelvic floor laxity.   10/07/2014 Imaging   CT scan of chest,  abdomen and pelvis: Stable to slight interval decrease in size of left paraspinous lesion at L3-4. No new sites of metastatic disease. Unchanged ground-glass nodule within the right lower lobe, potentially benign in etiology. Recommend attention on followup. Cholelithiasis. Sigmoid colonic diverticulosis. No CT evidence to suggest acute diverticulitis.   01/19/2015 Imaging   Ct scan of abdomen and pelvis 1. Decrease in size of left paraspinous metastasis. 2. No new sites of disease. 3. Stable ground-glass attenuating nodule within the superior segment of right lower lobe. 4. Gallstones   05/02/2015 Imaging   Ct abdomen and pelvis: Slight interval increase in size of left lumbar paraspinal soft tissue metastasis. No new sites of metastatic disease identified within the abdomen or pelvis.    05/11/2015 PET scan   1. The small soft tissue mass just lateral to the left L3 for neural foramen is hypermetabolic, favoring malignancy. 2. There is also a small focus of hypermetabolic activity between the left L1 and L2 transverse processes, but without a CT correlate. 3. The 13 mm sub solid nodule in the superior segment right lower lobe is stable from recent exams but has slowly increased in size over the last 7 years. Although not hypermetabolic, the appearance is concerning for  the possibility of a low grade adenocarcinoma. 4. Other imaging findings of potential clinical significance: Chronic bilateral maxillary sinusitis. Coronary, aortic arch, and branch vessel atherosclerotic vascular disease. Aortoiliac atherosclerotic vascular disease. Cholelithiasis. Sigmoid colon diverticulosis. Lumbar scoliosis.   07/11/2015 Imaging   Ct scan of chest, abdomen and pelvis: 1. 13 mm mixed solid and sub solid nodule in the posterior right lower lobe was present in 2009 and has clearly progressed in the interval since that study. Imaging features remain highly concerning for low-grade or well differentiated  adenocarcinoma. 2. Slight increase size of in the hypermetabolic, rim enhancing nodule identified adjacent to the left L3-4 neural foramen. Metastatic disease remains a concern. 3. No evidence for discrete soft tissue lesion between the left L1 and 2 transverse processes, the site of focal FDG uptake on the recent PET-CT. 4. Cholelithiasis. 5. Abdominal aortic atherosclerosis   08/03/2015 - 01/16/2016 Chemotherapy   She received carboplatin and Taxol   11/06/2015 PET scan   1. Hypermetabolic left L7-9 paraspinous metastasis (biopsy-proven) is stable in size and slightly decreased in metabolism. 2. Previously described small focus of hypermetabolism between the left L2 and L3 spinous processes has resolved. 3. New mild linear hypermetabolism to the left of the T11-12 spinous processes without discrete mass on the CT images, favor benign activity related activity, recommend attention on follow-up PET-CT.  4. No definite new sites of hypermetabolic metastatic disease. No recurrent hypermetabolic metastatic disease in the pelvis. 5. Interval stability of subsolid 1.3 cm superior segment right lower lobe pulmonary nodule without associated significant metabolism, which has grown compared to the 2009 chest CT study, and remain suspicious for low grade adenocarcinoma. 6. Additional findings include aortic atherosclerosis, coronary atherosclerosis, mild multinodular goiter with no hypermetabolic thyroid nodules, cholelithiasis and moderate sigmoid diverticulosis.   02/12/2016 PET scan   1. Interval increase in size and metabolic activity of the LEFT paraspinal soft tissue metastasis. 2. New activity within the musculature of the LEFT chest wall. Given the unusual location of the paraspinal metastasis cannot exclude a second soft tissue metastasis to the musculature however favor benign physiologic activity. 3. Stable RIGHT lower lobe pulmonary nodule. 4. No evidence of local recurrence.     04/08/2016  Imaging   Ct scan of chest, abdomen and pelvis: 1. Similar size of a  left paravertebral abdominal lesion. 2. No new or progressive metastatic disease. 3. No correlate for the muscular activity about the lateral left chest wall. 4.  Coronary artery atherosclerosis. Aortic atherosclerosis. 5. Cholelithiasis. 6. Similar right lower lobe pulmonary nodule.   06/03/2016 Imaging   CT abdomen and pelvis 1. Similar to mild enlargement of a paravertebral soft tissue lesion at the L3-4 level. 2. No new sites of disease identified. 3. Cholelithiasis. 4. Hysterectomy. 5.  Aortic atherosclerosis.    09/02/2016 Imaging   CT abdomen and pelvis 1. Continue mild interval increase in size of LEFT paraspinal mass (1-2 mm). 2. No evidence of new metastatic disease in the abdomen pelvis. 3. Post hysterectomy anatomy   09/26/2016 Imaging   MR lumbar spine 1. Left paraspinal metastasis with epicenter adjacent to the left L3 neural foramen does extend into the foramen to the level of the left lateral epidural space (series 9, image 31), but also tracks cephalad and caudal along the L2-L4 lumbar plexus (series 10, image 15). No extension into the left L2 or L4 neural foramina. And no more distal extension of tumor. 2. Abnormal signal in the medial left psoas and ventral left erector spinae  muscles at L2 and L3 is probably denervation related. 3. No bone invasion or osseous metastatic disease. Suspect previous radiation of the L2 through L5 spinal levels. 4. No dural or intradural metastatic disease.    10/22/2016 Procedure   She underwent stereotactic Radiosurgery   10/30/2016 Genetic Testing   Patient has genetic testing done for Inheritable genetic mutation panel. Results revealed patient has no actionable mutation.   01/21/2017 Imaging   1. Reduced size and conspicuity of the left paraspinal mass at the L3-4 level. The mass still present but has enhancement similar to that of adjacent psoas  musculature. 2. No new metastatic disease is identified. 3. Other imaging findings of potential clinical significance: Cholelithiasis. Aortic Atherosclerosis (ICD10-I70.0). Sigmoid diverticulosis. Lumbar scoliosis.   05/09/2017 Imaging   Ct scan of abdomen and pelvis 1. Paraspinal mass adjacent to the left side of L3-L4 is less distinct than prior examinations, and is therefore difficult to discretely measure, however, the overall appearance suggests continued positive response to therapy. 2. No new signs of metastatic disease elsewhere in the abdomen or pelvis. 3. Aortic atherosclerosis, in addition to at least right coronary artery disease. Assessment for potential risk factor modification, dietary therapy or pharmacologic therapy may be warranted, if clinically indicated. 4. Colonic diverticulosis without evidence of acute diverticulitis at this time. 5. Additional incidental findings, as above. Aortic Atherosclerosis (ICD10-I70.0).   08/14/2017 Imaging   1. Treated left paraspinal mass centered at L3-4. Mass size is stable but there has been some evolution of tumor characteristics with less central fluid seen today. Recommend continued surveillance. 2. No evidence of untreated metastasis. 3. Degenerative changes and related impingement are described above.   10/28/2017 Imaging   Stable small left paraspinal soft tissue mass. No new or progressive disease within the abdomen or pelvis.  Cholelithiasis.  No radiographic evidence of cholecystitis.  Colonic diverticulosis, without radiographic evidence of diverticulitis.   04/30/2018 Imaging   Stable post treatment change of the partially necrotic LEFT paravertebral mass at L3-L4. 18 x 21 mm cross-section. Mass effect on the LEFT L3 nerve root redemonstrated. Enhancing and atrophic LEFT psoas is inseparable.   10/15/2018 Imaging   1. Stable post treatment size and appearance of partially necrotic left paravertebral mass at L3-4, measuring 19  x 25 mm on current exam (unchanged when measured at similar level on previous study). Mass effect on the adjacent left L3 nerve root is unchanged. Adjacent enhancing and atrophic left psoas muscle. 2. Left convex scoliosis with associated multilevel degenerative spondylolysis and facet arthrosis, unchanged   10/29/2018 Imaging   Stable small left L3 paraspinal soft tissue mass. No evidence of new or progressive metastatic disease, or other acute findings.   Cholelithiasis.  No radiographic evidence of cholecystitis.   Colonic diverticulosis, without radiographic evidence of diverticulitis.   11/11/2018 PET scan   1. Low level metabolism (max SUV 2.0) associated with the irregular subsolid superior segment right lower lobe 2.1 cm pulmonary nodule. As better depicted on the recent diagnostic chest CT study, this nodule crosses the major fissure into the right upper lobe and has increased in size and density on multiple imaging studies back to 2009. Findings are most suggestive of primary bronchogenic adenocarcinoma. 2. No hypermetabolic thoracic adenopathy. 3. Persistent hypermetabolism (max SUV 8.0) associated with the known left L3-4 paraspinous metastasis, mildly decreased in metabolism since 02/12/2016 PET-CT. 4. No additional sites of hypermetabolic metastatic disease in the abdomen or pelvis. No metabolic evidence of peritoneal recurrence. No ascites. 5. Chronic findings include:  Aortic Atherosclerosis (ICD10-I70.0). Coronary atherosclerosis. Chronic bilateral maxillary sinusitis. Cholelithiasis. Moderate sigmoid diverticulosis.   01/07/2019 - 04/04/2019 Chemotherapy   The patient had carboplatin and Alimta for chemotherapy treatment.     05/03/2019 -  Chemotherapy   The patient had Pembrolizumab and Lenvima for chemotherapy treatment.     Metastatic cancer to bone ([place])  06/27/2014 Initial Diagnosis   Metastatic cancer to bone   Primary lung cancer, right ([place])  11/04/2018 Initial  Diagnosis   Primary lung cancer, right ([place])   11/11/2018 PET scan   1. Low level metabolism (max SUV 2.0) associated with the irregular subsolid superior segment right lower lobe 2.1 cm pulmonary nodule. As better depicted on the recent diagnostic chest CT study, this nodule crosses the major fissure into the right upper lobe and has increased in size and density on multiple imaging studies back to 2009. Findings are most suggestive of primary bronchogenic adenocarcinoma. 2. No hypermetabolic thoracic adenopathy. 3. Persistent hypermetabolism (max SUV 8.0) associated with the known left L3-4 paraspinous metastasis, mildly decreased in metabolism since 02/12/2016 PET-CT. 4. No additional sites of hypermetabolic metastatic disease in the abdomen or pelvis. No metabolic evidence of peritoneal recurrence. No ascites. 5. Chronic findings include: Aortic Atherosclerosis (ICD10-I70.0). Coronary atherosclerosis. Chronic bilateral maxillary sinusitis. Cholelithiasis. Moderate sigmoid diverticulosis.   12/07/2018 Pathology Results   1. Lung, resection (segmental or lobe), Right Lobe Superior with endblock wedge of right upper lobe - INVASIVE ADENOCARCINOMA, MODERATELY DIFFERENTIATED, SPANNING 1.6 CM. - THE SURGICAL RESECTION MARGINS ARE NEGATIVE FOR CARCINOMA. - SEE ONCOLOGY TABLE BELOW. 2. Lymph node, biopsy, Level 9 #1 - THERE IS NO EVIDENCE OF CARCINOMA IN 1 OF 1 LYMPH NODE (0/1). 3. Lymph node, biopsy, Level 9 #2 - THERE IS NO EVIDENCE OF CARCINOMA IN 1 OF 1 LYMPH NODE (0/1). 4. Lymph node, biopsy, Level 7 #1 - METASTATIC CARCINOMA IN 1 OF 1 LYMPH NODE (1/1). 5. Lymph node, biopsy, Level 7 #2 - THERE IS NO EVIDENCE OF CARCINOMA IN 1 OF 1 LYMPH NODE (0/1). 6. Lymph node, biopsy, Level 12 #1 - THERE IS NO EVIDENCE OF CARCINOMA IN 1 OF 1 LYMPH NODE (0/1). 7. Lymph node, biopsy, Level 12 #2 - THERE IS NO EVIDENCE OF CARCINOMA IN 1 OF 1 LYMPH NODE (0/1). 8. Lymph node, biopsy, Level 10 #1 - THERE IS  NO EVIDENCE OF CARCINOMA IN 1 OF 1 LYMPH NODE (0/1). 9. Lymph node, biopsy, Level 4R #1 - THERE IS NO EVIDENCE OF CARCINOMA IN 1 OF 1 LYMPH NODE (0/1). 10. Lymph node, biopsy, Level 4R #2 - THERE IS NO EVIDENCE OF CARCINOMA IN 1 OF 1 LYMPH NODE (0/1). 11. Lymph node, biopsy, Level 2R #1 - THERE IS NO EVIDENCE OF CARCINOMA IN 1 OF 1 LYMPH NODE (0/1). 12. Lung, resection (segmental or lobe), Right Lobe Superior - BENIGN LUNG PARENCHYMA. - THERE IS NO EVIDENCE OF MALIGNANCY. Procedure: Segmentectomy. Specimen Laterality: Right. Tumor Site: Right superior lobe. Tumor Size: 1.6 cm (gross measurement). Tumor Focality: Unifocal. Histologic Type: Adenocarcinoma, moderately differentiated. Visceral Pleura Invasion: Not identified. Lymphovascular Invasion: Not identified. Direct Invasion of Adjacent Structures: Not identified. Margins: Negative for carcinoma. Treatment Effect: N/A Regional Lymph Nodes: Number of Lymph Nodes Involved: 1 (level 7) Number of Lymph Nodes Examined: 10 Pathologic Stage Classification (pTNM, AJCC 8th Edition): pT1b, pN2 Ancillary Studies: The tumor cells are positive for Napsin-A, TTF-1, and p53. They are essentially negative for estrogen receptor, PAX-8 and progesterone receptor. Additional studies can be performed upon clinician request. Representative Tumor  Block: 1D-1E. (JBK:gt, 12/09/18)   12/07/2018 Surgery   PREOPERATIVE DIAGNOSIS:  Right lung nodule.   POSTOPERATIVE DIAGNOSIS:  Non-small cell carcinoma- suspected primary lung carcinoma, clinical stage T2N01b.   PROCEDURE:   Right video-assisted thoracoscopy, Right lower lobe superior segmentectomy with en bloc wedge resection of right upper lobe, Lymph node dissection, Intercostal nerve block levels 3-9.   SURGEON:  Modesto Charon, MD   FINDINGS:  Mass palpable in posterior aspect of superior segment of right lower lobe, likely involvement across fissure into the upper lobe.  Frozen section revealed  non-small cell carcinoma. upper and lower lobe margins clear.   12/23/2018 Cancer Staging   Staging form: Lung, AJCC 8th Edition - Pathologic: Stage IIIA (pT1b, pN2, cM0) - Signed by Heath Lark, MD on 12/23/2018   01/05/2019 Imaging   MRI brain 1. No metastatic disease or acute intracranial abnormality identified. 2. Small chronic parafalcine meningioma size is stable since that described in 2006 (12 x 15 mm). 3. Advanced signal changes in the cerebral white matter and to a lesser extent pons, nonspecific but most commonly due to chronic small vessel disease. 4. Partially visible degenerative cervical spinal stenosis.     01/07/2019 -  Chemotherapy   The patient had carboplatin and Alimta for chemotherapy treatment.     04/14/2019 PET scan   1. 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 As shown on recent MRI of 04/01/2019, there is a new component of invasion into the left lower L3 vertebral body. This new component has a maximum SUV of 10.0, compatible with active malignancy. 2. Interval wedge resection of portions of the right upper lobe and right lower lobe at the site of the prior nodule. There is only minimal low-grade activity along the wedge resection clips, maximum SUV of 1.5. Surveillance is likely warranted. No new nodule identified. 3. Other imaging findings of potential clinical significance: Aortic Atherosclerosis (ICD10-I70.0). Coronary atherosclerosis. Thoracolumbar scoliosis. Cholelithiasis. Sigmoid colon diverticulosis.     05/03/2019 -  Chemotherapy   The patient had Pembrolizumab and Lenvima for chemotherapy treatment.     05/22/2019 Imaging   Ct head Atrophy, chronic microvascular disease.   No acute intracranial abnormality.       REVIEW OF SYSTEMS:   Constitutional: Denies fevers, chills or abnormal weight loss Eyes: Denies blurriness of vision Ears, nose, mouth, throat, and face: Denies mucositis or sore throat Respiratory: Denies  cough, dyspnea or wheezes Cardiovascular: Denies palpitation, chest discomfort or lower extremity swelling Gastrointestinal:  Denies nausea, heartburn or change in bowel habits Skin: Denies abnormal skin rashes Lymphatics: Denies new lymphadenopathy or easy bruising Neurological:Denies numbness, tingling or new weaknesses Behavioral/Psych: Mood is stable, no new changes  All other systems were reviewed with the patient and are negative.  I have reviewed the past medical history, past surgical history, social history and family history with the patient and they are unchanged from previous note.  ALLERGIES:  is allergic to ace inhibitors; dilaudid [hydromorphone hcl]; and codeine.  MEDICATIONS:  Current Outpatient Medications  Medication Sig Dispense Refill  . amLODipine (NORVASC) 10 MG tablet Take 1 tablet (10 mg total) by mouth daily. 30 tablet 6  . aspirin EC 81 MG tablet Take 81 mg by mouth daily.    Marland Kitchen atenolol (TENORMIN) 25 MG tablet Take 25 mg by mouth 2 (two) times daily.     Marland Kitchen lenvatinib 10 mg daily dose (LENVIMA, 10 MG DAILY DOSE,) capsule Take 1 capsule (10 mg total) by mouth daily. [place]  capsule 11  . lidocaine-prilocaine (EMLA) cream [facility] 1-2 hours prior to access as directed 30 g 1  . LORazepam (ATIVAN) 0.5 MG tablet 1 tablet po 30 minutes prior to radiation or MRI 30 tablet 0  . Multiple Vitamin (MULTIVITAMIN WITH MINERALS) TABS tablet Take 1 tablet by mouth daily.    . simvastatin (ZOCOR) 20 MG tablet Take 20 mg by mouth every evening.      No current facility-administered medications for this visit.   Facility-Administered Medications Ordered in Other Visits  Medication Dose Route Frequency Provider Last Rate Last Admin  . heparin lock flush 100 unit/mL  500 Units Intracatheter Once Alvy Bimler, Ariele Vidrio, MD      . sodium chloride flush (NS) 0.9 % injection 10 mL  10 mL Intracatheter Once Alvy Bimler, Eriko Economos, MD      . sodium chloride flush (NS) 0.9 % injection 10 mL  10 mL  Intracatheter PRN Alvy Bimler, Niko Jakel, MD   10 mL at 07/05/19 1103    PHYSICAL EXAMINATION: ECOG PERFORMANCE STATUS: 1 - Symptomatic but completely ambulatory  Vitals:   07/05/19 0936  BP: (!) 162/71  Pulse: 66  Resp: 18  Temp: 98.1 F (36.7 C)  SpO2: 100%   Filed Weights   07/05/19 0936  Weight: 109 lb 12.8 oz (49.8 kg)    GENERAL:alert, no distress and comfortable SKIN: skin color, texture, turgor are normal, no rashes or significant lesions EYES: normal, Conjunctiva are pink and non-injected, sclera clear OROPHARYNX:no exudate, no erythema and lips, buccal mucosa, and tongue normal  NECK: supple, thyroid normal size, non-tender, without nodularity LYMPH:  no palpable lymphadenopathy in the cervical, axillary or inguinal LUNGS: clear to auscultation and percussion with normal breathing effort HEART: regular rate & rhythm and no murmurs and no lower extremity edema ABDOMEN:abdomen soft, non-tender and normal bowel sounds Musculoskeletal:no cyanosis of digits and no clubbing  NEURO: alert & oriented x 3 with fluent speech, no focal motor/sensory deficits  LABORATORY DATA:  I have reviewed the data as listed    Component Value Date/Time   NA 144 07/05/2019 0905   NA 140 05/09/2017 0844   K 4.0 07/05/2019 0905   K 4.1 05/09/2017 0844   CL 107 07/05/2019 0905   CO2 26 07/05/2019 0905   CO2 26 05/09/2017 0844   GLUCOSE 97 07/05/2019 0905   GLUCOSE 82 05/09/2017 0844   BUN 24 (H) 07/05/2019 0905   BUN 20.7 05/09/2017 0844   CREATININE 0.86 07/05/2019 0905   CREATININE 0.7 05/09/2017 0844   CALCIUM 9.4 07/05/2019 0905   CALCIUM 9.3 05/09/2017 0844   PROT 7.1 07/05/2019 0905   PROT 6.8 05/09/2017 0844   ALBUMIN 3.7 07/05/2019 0905   ALBUMIN 3.6 05/09/2017 0844   AST 42 (H) 07/05/2019 0905   AST 27 05/09/2017 0844   ALT 40 07/05/2019 0905   ALT 17 05/09/2017 0844   ALKPHOS 56 07/05/2019 0905   ALKPHOS 43 05/09/2017 0844   BILITOT 0.6 07/05/2019 0905   BILITOT 0.81  05/09/2017 0844   GFRNONAA >60 07/05/2019 0905   GFRAA >60 07/05/2019 0905    No results found for: SPEP, UPEP  Lab Results  Component Value Date   WBC 5.8 07/05/2019   NEUTROABS 3.2 07/05/2019   HGB 12.2 07/05/2019   HCT 35.7 (L) 07/05/2019   MCV 97.8 07/05/2019   PLT 151 07/05/2019      Chemistry      Component Value Date/Time   NA 144 07/05/2019 0905  NA 140 05/09/2017 0844   K 4.0 07/05/2019 0905   K 4.1 05/09/2017 0844   CL 107 07/05/2019 0905   CO2 26 07/05/2019 0905   CO2 26 05/09/2017 0844   BUN 24 (H) 07/05/2019 0905   BUN 20.7 05/09/2017 0844   CREATININE 0.86 07/05/2019 0905   CREATININE 0.7 05/09/2017 0844      Component Value Date/Time   CALCIUM 9.4 07/05/2019 0905   CALCIUM 9.3 05/09/2017 0844   ALKPHOS 56 07/05/2019 0905   ALKPHOS 43 05/09/2017 0844   AST 42 (H) 07/05/2019 0905   AST 27 05/09/2017 0844   ALT 40 07/05/2019 0905   ALT 17 05/09/2017 0844   BILITOT 0.6 07/05/2019 0905   BILITOT 0.81 05/09/2017 0844

## 2019-07-05 NOTE — Patient Instructions (Signed)

## 2019-07-05 NOTE — Patient Instructions (Signed)
[facility] Discharge Instructions for Patients Receiving Chemotherapy  Today you received the following chemotherapy agents: pembrolizumab.  To help prevent nausea and vomiting after your treatment, we encourage you to take your nausea medication as directed.   If you develop nausea and vomiting that is not controlled by your nausea medication, call the clinic.   BELOW ARE SYMPTOMS THAT SHOULD BE REPORTED IMMEDIATELY:  *FEVER GREATER THAN 100.5 F  *CHILLS WITH OR WITHOUT FEVER  NAUSEA AND VOMITING THAT IS NOT CONTROLLED WITH YOUR NAUSEA MEDICATION  *UNUSUAL SHORTNESS OF BREATH  *UNUSUAL BRUISING OR BLEEDING  TENDERNESS IN MOUTH AND THROAT WITH OR WITHOUT PRESENCE OF ULCERS  *URINARY PROBLEMS  *BOWEL PROBLEMS  UNUSUAL RASH Items with * indicate a potential emergency and should be followed up as soon as possible.  Feel free to call the clinic should you have any questions or concerns. The clinic phone number is [phone].  Please show the CHEMO ALERT CARD at check-in to the Emergency Department and triage nurse.   

## 2019-07-05 NOTE — Assessment & Plan Note (Signed)
The patient had multiple different cancer diagnosis I plan to order a PET CT scan for evaluation If insurance will not approve PET scan, then we will only switch to CT imaging Previously, her cancer was hard to diagnose and the treatment was based on abnormal PET activity noted on recent imaging

## 2019-07-05 NOTE — Assessment & Plan Note (Signed)
Her blood pressure control at home is satisfactory Urine protein is negative The patient have significant whitecoat hypertension She will continue current blood pressure prescription for now

## 2019-07-06 ENCOUNTER — Telehealth: Payer: Self-pay | Admitting: Hematology and Oncology

## 2019-07-06 NOTE — Telephone Encounter (Signed)
Scheduled appt per 2/22 sch message - pt is aware of appt date and time    Wants to move appts from 3/15 to 3/12 -  Message sent to Dr. Alvy Bimler

## 2019-07-15 ENCOUNTER — Ambulatory Visit (HOSPITAL_COMMUNITY)
Admission: RE | Admit: 2019-07-15 | Discharge: 2019-07-15 | Disposition: A | Discharge status: Home / Self Care | Payer: Medicare Other | Source: Ambulatory Visit | Attending: Radiation Oncology | Admitting: Radiation Oncology

## 2019-07-15 ENCOUNTER — Other Ambulatory Visit: Payer: Self-pay

## 2019-07-15 DIAGNOSIS — C7951 Secondary malignant neoplasm of bone: Secondary | ICD-10-CM | POA: Diagnosis not present

## 2019-07-15 MED ORDER — GADOBUTROL 1 MMOL/ML IV SOLN
4.0000 mL | Freq: Once | INTRAVENOUS | Status: AC | PRN
  Administered 2019-07-15: 4 mL via INTRAVENOUS

## 2019-07-16 ENCOUNTER — Telehealth: Payer: Self-pay

## 2019-07-19 ENCOUNTER — Inpatient Hospital Stay: Payer: Medicare Other | Attending: Hematology and Oncology

## 2019-07-19 ENCOUNTER — Ambulatory Visit
Admission: RE | Admit: 2019-07-19 | Discharge: 2019-07-19 | Disposition: A | Discharge status: Home / Self Care | Payer: Medicare Other | Source: Ambulatory Visit | Attending: Radiation Oncology | Admitting: Radiation Oncology

## 2019-07-19 ENCOUNTER — Other Ambulatory Visit: Payer: Self-pay

## 2019-07-19 ENCOUNTER — Encounter: Payer: Self-pay | Admitting: Radiation Oncology

## 2019-07-19 DIAGNOSIS — C3431 Malignant neoplasm of lower lobe, right bronchus or lung: Secondary | ICD-10-CM | POA: Insufficient documentation

## 2019-07-19 DIAGNOSIS — M4316 Spondylolisthesis, lumbar region: Secondary | ICD-10-CM | POA: Insufficient documentation

## 2019-07-19 DIAGNOSIS — Z885 Allergy status to narcotic agent status: Secondary | ICD-10-CM | POA: Insufficient documentation

## 2019-07-19 DIAGNOSIS — Z85830 Personal history of malignant neoplasm of bone: Secondary | ICD-10-CM | POA: Diagnosis not present

## 2019-07-19 DIAGNOSIS — C541 Malignant neoplasm of endometrium: Secondary | ICD-10-CM | POA: Insufficient documentation

## 2019-07-19 DIAGNOSIS — I10 Essential (primary) hypertension: Secondary | ICD-10-CM | POA: Insufficient documentation

## 2019-07-19 DIAGNOSIS — C7951 Secondary malignant neoplasm of bone: Secondary | ICD-10-CM | POA: Insufficient documentation

## 2019-07-19 DIAGNOSIS — Z171 Estrogen receptor negative status [ER-]: Secondary | ICD-10-CM | POA: Insufficient documentation

## 2019-07-19 DIAGNOSIS — Z5112 Encounter for antineoplastic immunotherapy: Secondary | ICD-10-CM | POA: Insufficient documentation

## 2019-07-19 DIAGNOSIS — I700 Atherosclerosis of aorta: Secondary | ICD-10-CM | POA: Insufficient documentation

## 2019-07-19 DIAGNOSIS — E039 Hypothyroidism, unspecified: Secondary | ICD-10-CM | POA: Insufficient documentation

## 2019-07-19 DIAGNOSIS — Z923 Personal history of irradiation: Secondary | ICD-10-CM | POA: Insufficient documentation

## 2019-07-19 DIAGNOSIS — Z08 Encounter for follow-up examination after completed treatment for malignant neoplasm: Secondary | ICD-10-CM | POA: Diagnosis not present

## 2019-07-19 DIAGNOSIS — R300 Dysuria: Secondary | ICD-10-CM | POA: Insufficient documentation

## 2019-07-19 DIAGNOSIS — Z79899 Other long term (current) drug therapy: Secondary | ICD-10-CM | POA: Insufficient documentation

## 2019-07-19 DIAGNOSIS — K8020 Calculus of gallbladder without cholecystitis without obstruction: Secondary | ICD-10-CM | POA: Insufficient documentation

## 2019-07-19 DIAGNOSIS — K5730 Diverticulosis of large intestine without perforation or abscess without bleeding: Secondary | ICD-10-CM | POA: Insufficient documentation

## 2019-07-19 DIAGNOSIS — I2510 Atherosclerotic heart disease of native coronary artery without angina pectoris: Secondary | ICD-10-CM | POA: Insufficient documentation

## 2019-07-19 DIAGNOSIS — C3491 Malignant neoplasm of unspecified part of right bronchus or lung: Secondary | ICD-10-CM

## 2019-07-19 DIAGNOSIS — G5792 Unspecified mononeuropathy of left lower limb: Secondary | ICD-10-CM | POA: Diagnosis not present

## 2019-07-19 DIAGNOSIS — Z9221 Personal history of antineoplastic chemotherapy: Secondary | ICD-10-CM | POA: Insufficient documentation

## 2019-07-19 DIAGNOSIS — Z8542 Personal history of malignant neoplasm of other parts of uterus: Secondary | ICD-10-CM | POA: Diagnosis not present

## 2019-07-19 NOTE — Progress Notes (Signed)
Radiation Oncology         [phone] ________________________________  Outpatient Follow Up - Conducted via telephone due to current COVID-19 concerns for limiting patient exposure  I spoke with the patient to conduct this consult visit via telephone to spare the patient unnecessary potential exposure in the healthcare setting during the current COVID-19 pandemic. The patient was notified in advance and was offered a WebEX meeting to allow for face to face communication but unfortunately reported that they did not have the appropriate resources/technology to support such a visit and instead preferred to proceed with a telephone visit. 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     Interval Since Last Radiation: 2 years, 9 months  10/22/2016 to 10/28/2016:  The L-spine, specifically L3 paraspinal tumor was treated to 27 Gy in 3 fractions at 9 Gy per fraction. To be more clear though, the L3 vertebral body was not treated.  07/11/14-08/12/14:  55 Gy in 25 fractions to the Lumbar spine  04/02/13:  Intraoperative radiotherapy to the left pelvis.  10/28/11-12/05/11: Radiotherapy to the left pelvic sidewall, details not available at time of dictation  2010:  Vaginal cuff brachytherapy, details of her dose are unknown  2000:  Adjuvant radiotherapy to the right breast, details are unknown  Narrative:   [name] is a 79 y.o.  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 The patient has a history of Stage I, T1N0, ER/PR positive, HER2 2+ ductal right breast cancer in 2000 which was treated with lumpectomy, sentinel lymph node assessment and tamoxifen. She was diagnosed with  stage IB papillary serous endometrial carcinoma in August 2009, underwent radical hysterectomy with 15 negative lymph nodes however LVSI was noted. She completed 6 cycles of Taxol carboplatin in January 2010, followed by a vaginal cuff brachytherapy. Her uterine cancer recurred in the fall of 2012 in the pelvic sidewall. She continued chemotherapy, and proceeded with radiotherapy in 2013 to the site. She also developed disease in the pelvis, she subsequently underwent radical resection at [facility] in November 2014 with resection of pelvic mass, left ureteral lysis, intraoperative radiotherapy, omental J flap and left ureteral stent placement. She did well until 2016 when she had lumbar pain and an MRI in an orthopedic office revealed a 2.7 x 2.4 x 1.9 cm mass at the L3-L4 location with left neural foramen involvement, and focal destruction of L3. A CT guided biopsy confirmed high grade serous carcinoma consistent with her uterine cancer. She received Radiotherapy to the lumbar spine, and began Zometa along with her systemic therapy. She had new pulmonary disease identified in the right lower lobe in January 2017, this was stabilized with chemotherapy, and her paraspinal mass was also relatively stable until her scan on 09/02/16 measured this at 27 x 21 mm, previously 25 x 18 mm in January 2018. She did not have any other surgical options available, and underwent SRS to the paraspinal disease in June 2018. She was found to have progressive disease earlier in 2020 and started systemic chemotherapy with Carbolplatin/Alimta under the direction of Dr. Gorsuch. She also was found to have progressive changes in the lung on the RLL and on 12/07/18 underwent wedge resection of the RLL revealing   a Stage IIIA, pT1bN2 adenocarcinoma felt to be a primarily lung cancer. She is currently receiving immunotherapy based infusions with Keytruda. This change was made following imaging in December 2020. At our last discussion we did not feel  that there was a role for any further therapy with radiation, given her prior treatment locations. She remains in surveillance in the brain and spine oncology conference. A short interval scan on 07/15/19 revealed improvement in the paraspinous lesion about L3 and the edema and enhancement remains unchanged but without new disease.    On review of systems, the patient reports that she is doing well overall with [name] but has had some mild fatigue related to treatment. She reports some mild improvement just in the last few months in the discomfort she has noted in the left thigh into the knee for years. She denies any new back pain or radiculopathy, weakness, or numbness.  She denies any chest pain, shortness of breath, cough, fevers, chills, night sweats, unintended weight changes. She denies any bowel or bladder disturbances, and denies abdominal pain, nausea or vomiting. She denies any new musculoskeletal or joint aches or pains, new skin lesions or concerns. A complete review of systems is obtained and is otherwise negative.   Past Medical History:  Past Medical History:  Diagnosis Date  . Endometrial cancer ([place]) 12/2007   s/p total abdominal hysterectomy at [facility] - ovaries were also removed   . Family history of breast cancer   . History of breast cancer    right breast -- treated with lumpectomy and radiation therarpy, postoperatively with tamoxifen   . History of radiation therapy 10/22/2016 to 10/28/2016  . Hypercholesterolemia   . Hyperlipidemia   . Hypertension   . Palpitations   . Personal history of radiation therapy 2006  . PVC's (premature ventricular contractions)   . Radiation 07/11/14-08/12/14   left lumbar paraspinal area 55 gray  . S/P radiation therapy 10/28/11-12/05/11   5040 cGy left pelvis  . S/P radiation therapy    Intracavitary brachytherapy of uterus    Past Surgical History: Past Surgical History:  Procedure Laterality Date  . APPENDECTOMY    . BREAST  LUMPECTOMY Right 2000  . COLONOSCOPY     3X  . FOOT SURGERY     RIGHT  . SEGMENTECOMY Right 12/07/2018   Procedure: RIGHT LOWER LOBE SUPERIOR SEGMENTECTOMY;  Surgeon: Melrose Nakayama, MD;  Location: [facility];  Service: Thoracic;  Laterality: Right;  . TONSILLECTOMY    . TOTAL ABDOMINAL HYSTERECTOMY W/ BILATERAL SALPINGOOPHORECTOMY    . VIDEO ASSISTED THORACOSCOPY Right 12/07/2018   Procedure: VIDEO ASSISTED THORACOSCOPY;  Surgeon: Melrose Nakayama, MD;  Location: [facility];  Service: Thoracic;  Laterality: Right;    Social History:  Social History   Socioeconomic History  . Marital status: Married    Spouse name: [name]  . Number of children: 2  . Years of education: Not on file  . Highest education level: Not on file  Occupational History  . Occupation: retired  Tobacco Use  . Smoking status: Former Smoker    Types: Cigarettes    Quit date: 05/13/1962    Years since quitting: 57.2  . Smokeless tobacco: Never Used  Substance and Sexual Activity  . Alcohol use: Yes    Comment: occasionally  . Drug use: No  . Sexual activity: Not Currently    Birth control/protection: Post-menopausal  Other Topics Concern  . Not on file  Social History Narrative  . Not on  file   Social Determinants of Health   Financial Resource Strain: Low Sandoval   . Difficulty of Paying Living Expenses: Not hard at all  Food Insecurity: No Food Insecurity  . Worried About Running Out of Food in the Last Year: Never true  . Ran Out of Food in the Last Year: Never true  Transportation Needs: No Transportation Needs  . Lack of Transportation (Medical): No  . Lack of Transportation (Non-Medical): No  Physical Activity:   . Days of Exercise per Week: Not on file  . Minutes of Exercise per Session: Not on file  Stress:   . Feeling of Stress : Not on file  Social Connections:   . Frequency of Communication with Friends and Family: Not on file  . Frequency of Social Gatherings with Friends and Family:  Not on file  . Attends Religious Services: Not on file  . Active Member of Clubs or Organizations: Not on file  . Attends Club or Organization Meetings: Not on file  . Marital Status: Not on file  Intimate Partner Violence:   . Fear of Current or Ex-Partner: Not on file  . Emotionally Abused: Not on file  . Physically Abused: Not on file  . 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  Family History: Family History  Problem Relation Age of Onset  . Thrombosis Father        coronary thrombosis  . Hypertension Father   . Heart failure Mother   . Coronary artery disease Brother   . Breast cancer Maternal Aunt        dx in her 40s  . Stroke Maternal Uncle   . Stroke Maternal Grandfather      ALLERGIES:  is allergic to ace inhibitors; dilaudid [hydromorphone hcl]; and codeine.  Meds: Current Outpatient Medications  Medication Sig Dispense Refill  . amLODipine (NORVASC) 5 MG tablet Take 5 mg by mouth daily.    . aspirin EC 81 MG tablet Take 81 mg by mouth daily.    . atenolol (TENORMIN) 25 MG tablet Take 25 mg by mouth 2 (two) times daily.     . folic acid (FOLVITE) 1 MG tablet Take 1 mg by mouth once a week.    . lenvatinib 10 mg daily dose (LENVIMA, 10 MG DAILY DOSE,) capsule Take 1 capsule (10 mg total) by mouth daily. 30 capsule 11  . lidocaine-prilocaine (EMLA) cream Apply to [place] cath 1-2 hours prior to access as directed 30 g 1  . LORazepam (ATIVAN) 0.5 MG tablet 1 tablet po 30 minutes prior to radiation or MRI 30 tablet 0  . Multiple Vitamin (MULTIVITAMIN WITH MINERALS) TABS tablet Take 1 tablet by mouth daily.    . ondansetron (ZOFRAN) 8 MG tablet PLEASE SEE ATTACHED FOR DETAILED DIRECTIONS    . simvastatin (ZOCOR) 20 MG tablet Take 20 mg by mouth every evening.      No current facility-administered medications for this encounter.    Facility-Administered Medications Ordered in Other Encounters  Medication Dose Route Frequency Provider Last Rate Last Admin  . heparin lock flush 100 unit/mL  500 Units Intracatheter Once Gorsuch, Ni, MD      . sodium chloride flush (NS) 0.9 % injection 10 mL  10 mL Intracatheter Once Gorsuch, Ni, MD        Physical Findings: Unable to assess due to encounter type.    Lab Findings: Lab Results  Component Value Date   WBC 5.8 07/05/2019   HGB 12.2 07/05/2019   HCT 35.7 (L) 07/05/2019   MCV 97.8 07/05/2019   PLT 151 07/05/2019     Radiographic Findings: MR Lumbar Spine W Wo Contrast  Result Date: 07/15/2019 CLINICAL DATA:  History of metastatic non-small lung carcinoma and endometrial cancer. Paravertebral metastatic deposit at the level of L3. EXAM: MRI LUMBAR SPINE WITHOUT AND WITH CONTRAST TECHNIQUE: Multiplanar and multiecho pulse sequences of the lumbar spine were obtained without and with intravenous contrast. CONTRAST:  4 mL GADAVIST IV SOLN COMPARISON:  PET CT scan 04/14/2019. MRI lumbar spine 04/01/2019, 10/15/2018 and 04/30/2018. FINDINGS: Segmentation:  Standard. Alignment: 0.6 cm anterolisthesis L4 on L5 and convex right scoliosis. Alignment is unchanged. Vertebrae: Again seen is decreased T1, increased T2 signal and postcontrast enhancement in the L3 vertebral body inferiorly and to the left. The anterior margin of the vertebral body is spared. No fracture is identified. Increased T1 and T2 signal in the anterior aspect of L3, throughout L4 and L5 and into the sacrum is consistent with prior radiation therapy. No new lesion is identified. Conus medullaris and cauda equina: Conus extends to the L1-2 level. Conus and cauda equina appear normal. Paraspinal and other soft tissues: Paravertebral soft tissue mass lesion on the left at L3 which extends into the left foramen and vertebral body demonstrates decreased enhancement compared to the prior MRI. The lesion measures approximately  2 x 1.5 cm on image 20 of series 15 compared to 2.2 x 1.7 cm on the most recent MRI. The cystic component in the lesion seen on most recent MRI is not present today. Disc levels: Multilevel spondylosis, worst at L4-5 is unchanged. IMPRESSION: Left paraspinous metastatic deposit at L3 appears improved compared to the most recent MRI. Edema and enhancement in the L3 vertebral body consistent with metastatic disease is unchanged. No new abnormality. Electronically Signed   By: Thomas  Dalessio M.D.   On: 07/15/2019 12:03    Impression/Plan: 1. 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 Gorsuch following her PET scan later this week. Given that there had been concern of increased enhancement in her paraspinous lesion back in November, we proceeded with repeat at 4 months time. I think it would be wise to do her next scan at the same interval prior to switching back to a 6 month interval. With the covid pandemic as well her annual complete spine imaging was delayed and she could proceed with her next MRI to include the complete spine. If however she has another PET scan at that time, we could just do MRI lumber spine and the complete spine MRI at the following interval. She is in agreement with this plan. 2. Left thigh/knee neuropathy. The paitent has been evaluated by Dr. Harkins in the past. Thankfully her symptoms seem to be slightly improved, and don't interfere with her quality of life, this will be followed. Of note she has not tolerated gabapentin in the past for nerve pain due to degenerative changes.    Given current concerns for patient exposure during the COVID-19 pandemic, this encounter was conducted via  telephone.  The patient has given verbal consent for this type of encounter. In   a visit lasting 30 minutes, greater than 50% of the time was spent discussing the patient's condition, in preparation for the discussion, and coordinating the patient's care. The attendants for this meeting include Shona Simpson, [facility] and [name]  During the encounter, Shona Simpson [facility] was located at [facility] Radiation Oncology Department.  [name]  was located at home.     Carola Rhine, PAC

## 2019-07-22 NOTE — Telephone Encounter (Signed)
Spoke with patient to remind her of telephone encounter on Monday she verbalized she was aware

## 2019-07-23 ENCOUNTER — Other Ambulatory Visit: Payer: Self-pay

## 2019-07-23 ENCOUNTER — Ambulatory Visit (HOSPITAL_COMMUNITY)
Admission: RE | Admit: 2019-07-23 | Discharge: 2019-07-23 | Disposition: A | Discharge status: Home / Self Care | Payer: Medicare Other | Source: Ambulatory Visit | Attending: Hematology and Oncology | Admitting: Hematology and Oncology

## 2019-07-23 DIAGNOSIS — K8020 Calculus of gallbladder without cholecystitis without obstruction: Secondary | ICD-10-CM | POA: Insufficient documentation

## 2019-07-23 DIAGNOSIS — C3491 Malignant neoplasm of unspecified part of right bronchus or lung: Secondary | ICD-10-CM | POA: Diagnosis not present

## 2019-07-23 DIAGNOSIS — C541 Malignant neoplasm of endometrium: Secondary | ICD-10-CM | POA: Insufficient documentation

## 2019-07-23 DIAGNOSIS — I700 Atherosclerosis of aorta: Secondary | ICD-10-CM | POA: Insufficient documentation

## 2019-07-23 DIAGNOSIS — Z79899 Other long term (current) drug therapy: Secondary | ICD-10-CM | POA: Diagnosis not present

## 2019-07-23 LAB — GLUCOSE, CAPILLARY: Glucose-Capillary: 84 mg/dL (ref 70–99)

## 2019-07-23 MED ORDER — FLUDEOXYGLUCOSE F - 18 (FDG) INJECTION
5.0000 | Freq: Once | INTRAVENOUS | Status: AC | PRN
  Administered 2019-07-23: 5 via INTRAVENOUS

## 2019-07-26 ENCOUNTER — Other Ambulatory Visit: Payer: Self-pay

## 2019-07-26 ENCOUNTER — Telehealth: Payer: Self-pay

## 2019-07-26 ENCOUNTER — Inpatient Hospital Stay (HOSPITAL_BASED_OUTPATIENT_CLINIC_OR_DEPARTMENT_OTHER): Payer: Medicare Other | Admitting: Hematology and Oncology

## 2019-07-26 ENCOUNTER — Inpatient Hospital Stay: Payer: Medicare Other

## 2019-07-26 ENCOUNTER — Other Ambulatory Visit: Payer: Self-pay | Admitting: Hematology and Oncology

## 2019-07-26 VITALS — BP 140/60 | HR 78 | Temp 97.8°F | Resp 18 | Ht 59.0 in | Wt 107.6 lb

## 2019-07-26 DIAGNOSIS — K5730 Diverticulosis of large intestine without perforation or abscess without bleeding: Secondary | ICD-10-CM | POA: Diagnosis not present

## 2019-07-26 DIAGNOSIS — Z79899 Other long term (current) drug therapy: Secondary | ICD-10-CM | POA: Diagnosis not present

## 2019-07-26 DIAGNOSIS — C541 Malignant neoplasm of endometrium: Secondary | ICD-10-CM

## 2019-07-26 DIAGNOSIS — I10 Essential (primary) hypertension: Secondary | ICD-10-CM

## 2019-07-26 DIAGNOSIS — Z5112 Encounter for antineoplastic immunotherapy: Secondary | ICD-10-CM | POA: Diagnosis not present

## 2019-07-26 DIAGNOSIS — E039 Hypothyroidism, unspecified: Secondary | ICD-10-CM

## 2019-07-26 DIAGNOSIS — Z885 Allergy status to narcotic agent status: Secondary | ICD-10-CM | POA: Diagnosis not present

## 2019-07-26 DIAGNOSIS — I700 Atherosclerosis of aorta: Secondary | ICD-10-CM | POA: Diagnosis not present

## 2019-07-26 DIAGNOSIS — Z171 Estrogen receptor negative status [ER-]: Secondary | ICD-10-CM | POA: Diagnosis not present

## 2019-07-26 DIAGNOSIS — K8020 Calculus of gallbladder without cholecystitis without obstruction: Secondary | ICD-10-CM | POA: Diagnosis not present

## 2019-07-26 DIAGNOSIS — C7951 Secondary malignant neoplasm of bone: Secondary | ICD-10-CM | POA: Diagnosis not present

## 2019-07-26 DIAGNOSIS — M4316 Spondylolisthesis, lumbar region: Secondary | ICD-10-CM | POA: Diagnosis not present

## 2019-07-26 DIAGNOSIS — C3491 Malignant neoplasm of unspecified part of right bronchus or lung: Secondary | ICD-10-CM | POA: Diagnosis not present

## 2019-07-26 DIAGNOSIS — Z923 Personal history of irradiation: Secondary | ICD-10-CM | POA: Diagnosis not present

## 2019-07-26 DIAGNOSIS — D701 Agranulocytosis secondary to cancer chemotherapy: Secondary | ICD-10-CM

## 2019-07-26 DIAGNOSIS — Z7189 Other specified counseling: Secondary | ICD-10-CM

## 2019-07-26 DIAGNOSIS — R300 Dysuria: Secondary | ICD-10-CM

## 2019-07-26 DIAGNOSIS — C3431 Malignant neoplasm of lower lobe, right bronchus or lung: Secondary | ICD-10-CM | POA: Diagnosis not present

## 2019-07-26 DIAGNOSIS — Z9221 Personal history of antineoplastic chemotherapy: Secondary | ICD-10-CM | POA: Diagnosis not present

## 2019-07-26 DIAGNOSIS — C549 Malignant neoplasm of corpus uteri, unspecified: Secondary | ICD-10-CM

## 2019-07-26 DIAGNOSIS — Z95828 Presence of other vascular implants and grafts: Secondary | ICD-10-CM

## 2019-07-26 DIAGNOSIS — I2510 Atherosclerotic heart disease of native coronary artery without angina pectoris: Secondary | ICD-10-CM | POA: Diagnosis not present

## 2019-07-26 DIAGNOSIS — Z853 Personal history of malignant neoplasm of breast: Secondary | ICD-10-CM

## 2019-07-26 LAB — URINALYSIS, COMPLETE (UACMP) WITH MICROSCOPIC
Bilirubin Urine: NEGATIVE
Glucose, UA: NEGATIVE mg/dL
Ketones, ur: NEGATIVE mg/dL
Nitrite: NEGATIVE
Protein, ur: NEGATIVE mg/dL
Specific Gravity, Urine: 1.013 (ref 1.005–1.030)
pH: 5 (ref 5.0–8.0)

## 2019-07-26 LAB — CBC WITH DIFFERENTIAL (CANCER CENTER ONLY)
Abs Immature Granulocytes: 0.05 10*3/uL (ref 0.00–0.07)
Basophils Absolute: 0 10*3/uL (ref 0.0–0.1)
Basophils Relative: 1 %
Eosinophils Absolute: 0.5 10*3/uL (ref 0.0–0.5)
Eosinophils Relative: 9 %
HCT: 36.6 % (ref 36.0–46.0)
Hemoglobin: 12.7 g/dL (ref 12.0–15.0)
Immature Granulocytes: 1 %
Lymphocytes Relative: 25 %
Lymphs Abs: 1.4 10*3/uL (ref 0.7–4.0)
MCH: 33.4 pg (ref 26.0–34.0)
MCHC: 34.7 g/dL (ref 30.0–36.0)
MCV: 96.3 fL (ref 80.0–100.0)
Monocytes Absolute: 0.7 10*3/uL (ref 0.1–1.0)
Monocytes Relative: 13 %
Neutro Abs: 3 10*3/uL (ref 1.7–7.7)
Neutrophils Relative %: 51 %
Platelet Count: 144 10*3/uL — ABNORMAL LOW (ref 150–400)
RBC: 3.8 MIL/uL — ABNORMAL LOW (ref 3.87–5.11)
RDW: 12.8 % (ref 11.5–15.5)
WBC Count: 5.8 10*3/uL (ref 4.0–10.5)
nRBC: 0 % (ref 0.0–0.2)

## 2019-07-26 LAB — CMP (CANCER CENTER ONLY)
ALT: 33 U/L (ref 0–44)
AST: 39 U/L (ref 15–41)
Albumin: 3.9 g/dL (ref 3.5–5.0)
Alkaline Phosphatase: 49 U/L (ref 38–126)
Anion gap: 11 (ref 5–15)
BUN: 24 mg/dL — ABNORMAL HIGH (ref 8–23)
CO2: 25 mmol/L (ref 22–32)
Calcium: 9.6 mg/dL (ref 8.9–10.3)
Chloride: 107 mmol/L (ref 98–111)
Creatinine: 0.97 mg/dL (ref 0.44–1.00)
GFR, Est AFR Am: >60 mL/min (ref >60)
GFR, Estimated: 56 mL/min — ABNORMAL LOW (ref >60)
Glucose, Bld: 104 mg/dL — ABNORMAL HIGH (ref 70–99)
Potassium: 4 mmol/L (ref 3.5–5.1)
Sodium: 143 mmol/L (ref 135–145)
Total Bilirubin: 1 mg/dL (ref 0.3–1.2)
Total Protein: 7.1 g/dL (ref 6.5–8.1)

## 2019-07-26 LAB — TSH: TSH: 6.255 u[IU]/mL — ABNORMAL HIGH (ref 0.308–3.960)

## 2019-07-26 LAB — TOTAL PROTEIN, URINE DIPSTICK

## 2019-07-26 MED ORDER — HEPARIN SOD (PORK) LOCK FLUSH 100 UNIT/ML IV SOLN
500.0000 [IU] | Freq: Once | INTRAVENOUS | Status: AC | PRN
  Administered 2019-07-26: 500 [IU]
  Filled 2019-07-26: qty 5

## 2019-07-26 MED ORDER — SODIUM CHLORIDE 0.9% FLUSH
10.0000 mL | INTRAVENOUS | Status: DC | PRN
  Administered 2019-07-26: 10 mL
  Filled 2019-07-26: qty 10

## 2019-07-26 MED ORDER — LEVOTHYROXINE SODIUM 25 MCG PO TABS: 25.0000 ug | ORAL_TABLET | Freq: Every day | ORAL | 11 refills | Status: DC

## 2019-07-26 MED ORDER — SODIUM CHLORIDE 0.9% FLUSH
10.0000 mL | Freq: Once | INTRAVENOUS | Status: AC
  Administered 2019-07-26: 10 mL
  Filled 2019-07-26: qty 10

## 2019-07-26 MED ORDER — SODIUM CHLORIDE 0.9 % IV SOLN
Freq: Once | INTRAVENOUS | Status: AC
  Filled 2019-07-26: qty 250

## 2019-07-26 MED ORDER — SODIUM CHLORIDE 0.9 % IV SOLN
200.0000 mg | Freq: Once | INTRAVENOUS | Status: AC
  Administered 2019-07-26: 200 mg via INTRAVENOUS
  Filled 2019-07-26: qty 8

## 2019-07-26 NOTE — Patient Instructions (Signed)
[facility] Discharge Instructions for Patients Receiving Chemotherapy  Today you received the following chemotherapy agents: pembrolizumab.  To help prevent nausea and vomiting after your treatment, we encourage you to take your nausea medication as directed.   If you develop nausea and vomiting that is not controlled by your nausea medication, call the clinic.   BELOW ARE SYMPTOMS THAT SHOULD BE REPORTED IMMEDIATELY:  *FEVER GREATER THAN 100.5 F  *CHILLS WITH OR WITHOUT FEVER  NAUSEA AND VOMITING THAT IS NOT CONTROLLED WITH YOUR NAUSEA MEDICATION  *UNUSUAL SHORTNESS OF BREATH  *UNUSUAL BRUISING OR BLEEDING  TENDERNESS IN MOUTH AND THROAT WITH OR WITHOUT PRESENCE OF ULCERS  *URINARY PROBLEMS  *BOWEL PROBLEMS  UNUSUAL RASH Items with * indicate a potential emergency and should be followed up as soon as possible.  Feel free to call the clinic should you have any questions or concerns. The clinic phone number is [phone].  Please show the CHEMO ALERT CARD at check-in to the Emergency Department and triage nurse.   

## 2019-07-26 NOTE — Telephone Encounter (Signed)
Given results or TSH per Dr. Blair Promise. This could explain the low energy. Synthroid Rx sent to pharmacy, take first thing in the am, 30 mins before food. She verbalized understanding.

## 2019-07-27 ENCOUNTER — Telehealth: Payer: Self-pay

## 2019-07-27 ENCOUNTER — Encounter: Payer: Self-pay | Admitting: Hematology and Oncology

## 2019-07-27 LAB — URINE CULTURE: Culture: <10000 — AB

## 2019-07-27 NOTE — Assessment & Plan Note (Signed)
She has symptoms of dysuria Urinalysis and urine culture did not show significant growth Observe for now

## 2019-07-27 NOTE — Assessment & Plan Note (Signed)
Her PET scan showed excellent response to treatment She tolerated treatment well without major side effects The plan will be to continue treatment indefinitely Her next imaging study would be in September

## 2019-07-27 NOTE — Assessment & Plan Note (Signed)
Her blood pressure control at home is satisfactory Urine protein is negative She will continue current blood pressure prescription for now

## 2019-07-27 NOTE — Assessment & Plan Note (Signed)
She has no signs or symptoms of relapse in her lung We will continue serial imaging study to follow

## 2019-07-27 NOTE — Progress Notes (Signed)
[place] OFFICE PROGRESS NOTE  Patient Care Team: Shon Baton, MD as PCP - General (Internal Medicine) Heath Lark, MD as Consulting Physician (Hematology and Oncology)  ASSESSMENT & PLAN:  Endometrial cancer [facility]) Her PET scan showed excellent response to treatment She tolerated treatment well without major side effects The plan will be to continue treatment indefinitely Her next imaging study would be in September  Primary lung cancer, right [facility]) She has no signs or symptoms of relapse in her lung We will continue serial imaging study to follow  Acquired hypothyroidism She has fatigue TSH is elevated secondary to acquired hypothyroidism due to side effects of Keytruda I recommend low-dose thyroid replacement therapy We will monitor her thyroid function carefully  Dysuria She has symptoms of dysuria Urinalysis and urine culture did not show significant growth Observe for now  Essential hypertension Her blood pressure control at home is satisfactory Urine protein is negative She will continue current blood pressure prescription for now   Orders Placed This Encounter  Procedures  . Urine Culture    Standing Status:   Future    Number of Occurrences:   1    Standing Expiration Date:   08/29/2020  . Urinalysis, Complete w Microscopic    Standing Status:   Future    Number of Occurrences:   1    Standing Expiration Date:   07/25/2020    All questions were answered. The patient knows to call the clinic with any problems, questions or concerns. The total time spent in the appointment was 30 minutes encounter with patients including review of chart and various tests results, discussions about plan of care and coordination of care plan   Heath Lark, MD 07/27/2019 4:57 PM  INTERVAL HISTORY: Please see below for problem oriented charting. She returns to review test result She tolerated treatment well She complained of some mild dysuria Blood pressure  monitoring at home is satisfactory No infusion reaction She does complain of some fatigue No recent cough, chest pain or shortness of breath No new bone pain  SUMMARY OF ONCOLOGIC HISTORY: Oncology History Overview Note  Biopsy of recurrence 06-2014 ER negative. PDL1 testing low at 2% on the uterine cancer She progressed on carboplatin and Paclitaxel for uterine cancer HER 2 negative on pathology [id number] Negative genetic testing  On her lung cancer sample, [employer] [facility] testing revealed 10% PD-1 positive EGFR mutation was detected, exon 19 deletion   Endometrial cancer ([place])  12/01/2007 Initial Diagnosis   She has recurrent papillary serous endometrial carcinoma. Briefly she was diagnosed in 2009 with a FIGO IB pappilary serous carcinoma treated with hysterectomy followed by adjuvant chemo and vaginal brachytherapy. She then had a recurrence diagnosed in late 2012 that was deemed not to be surgically resectable, and was treated with 6 cycles of carbo/taxol followed by 5040 cGy of EBRT to the pelvic mass. She was then followed with serial imaging and was noted in June of this year to have slight increase in the size of the mass, and again in September there was small enlargement of the mass. She had a re-staging PET scan on 03/10/13 that showed FDG activity in the pelvic mass, with no other areas of disease   12/01/2007 Imaging   CT scan of chest, abdomen and pelvis: 1.  Mass like area of decreased attenuation in the central uterus, consistent with the known endometrial carcinoma.  Deep myometrial invasion is suspected.  Pelvic MRI without and with contrast could be performed for further  staging workup if clinically warranted. 2.  No evidence of extrauterine extension or lymphadenopathy. 3.  Sigmoid diverticulosis incidentally noted.   12/15/2007 Surgery   --12/2007 laparoscopic hysterectomy and PLND --05/2008 complted 6 cycles adjuvant carbo/taxol followed by vaginal cuff brachy --03/2011  exploratory lararoscopy, ureterolysis --4/13 completed 6 cycels carbotaxol --8/13 completed EBRT to the left pelvic sidewall    06/10/2008 Imaging   CT scan abdomen and pelvis 1.  Nonspecific mildly prominent inguinal lymph nodes, right greater than left. 2.  Interval hysterectomy without evidence of recurrent pelvic mass or fluid collection.   03/22/2011 Imaging   Ct scan abdomen and pelvis 1.  Local uterine cancer recurrence with two large nodes along the left pelvic sidewall. 2.  No evidence of bowel obstruction, urinary obstruction, or more distant metastasis.   03/31/2011 Relapse/Recurrence   chemotherapy and external beam radiation   05/16/2011 - 09/17/2011 Chemotherapy   She had 6 cycles of carboplatin and Taxol    07/17/2011 Imaging   Ct scan abdomen and pelvis 1.  Interval improvement in the previously demonstrated left pelvic local recurrence of tumor. 2.  No disease progression or complication identified. 3.  Mild bladder wall thickening on the right, nonspecific and possibly related to incomplete distension and/or radiation therapy. 4.  Cholelithiasis   10/11/2011 Imaging   CT scan abdomen and pelvis 1.  Interval enlargement of the left pelvic sidewall masses. 2.  No new nodular disease in the pelvis or lymphadenopathy. 3.  No evidence  of distant metastasis. 4.  Mild hydronephrosis on the right is similar to prior. 5.  Focal thickening of the right aspect the bladder is stable compared to prior.    10/28/2011 - 12/05/2011 Radiation Therapy   radiation for pelvic sidewall recurrence   10/28/2011 - 12/05/2011 Radiation Therapy   10/28/11-12/05/11: Radiotherapy to the left pelvic sidewall   03/18/2012 Imaging   CT abdomen 1.  Today's study demonstrates a positive response to therapy with decreased size of left pelvic sidewall nodal masses, as detailed above.  No new soft tissue masses or new lymphadenopathy is identified within the abdomen or pelvis. Continue attention on  follow-up studies is recommended. 2.  Cholelithiasis without findings to suggest acute cholecystitis. 3.  Colonic diverticulosis without findings to suggest acute diverticulitis at this time. 4.  Mild asymmetric urinary bladder wall thickening is unchanged compared to prior examinations and is nonspecific.  No definite bladder wall mass is identified at this time. 5.  Additional findings, similar to prior examinations, as above   06/22/2012 Imaging   CT abdomen 1.  Further decreased size of the left pelvic peripherally enhancing lesion. 2.  No new sites of new or progressive disease. 3. Esophageal air fluid level suggests dysmotility or gastroesophageal reflux. 4.  Cholelithiasis   11/02/2012 Imaging   CT abdomen 1.  The left pelvic side wall lesion demonstrates mild increase in size from previous exam. 2.  No new sites of new or progressive disease. 3.  Cholelithiasis.    01/28/2013 Imaging   CT abdomen 1. Interval small increase in volume of enhancing mass adjacent to the left pelvic sidewall. 2. No evidence of abdominal or pelvic lymphadenopathy   04/02/2013 Surgery   pelvic mass resection with IORT   04/02/2013 - 04/02/2013 Radiation Therapy   04/02/13: Intraoperative radiotherapy to the left pelvis   05/10/2013 PET scan   1. Hypermetabolic mass in the deep left pelvis consistent with metastasis. 2. No additional evidence of local metastasis. No evidence of distant metastasis. 3.  Small right lower lobe pulmonary nodule is not hypermetabolic.   07/27/2013 Imaging   No evidence of recurrent or metastatic disease. Apparent prior resection of the deep left pelvic metastasis.   02/08/2014 Imaging   No CT findings for recurrent or metastatic disease involving the abdomen/ pelvis   06/13/2014 Relapse/Recurrence   + recurrence paraspinous muscle   06/21/2014 Procedure   There is a soft tissue lesion along the left side of L3-L4. This lesion roughly measures up to 2.2 cm. Needle was  positioned along the posterior aspect of the lesion. Small amount of air in the paraspinal tissues following needle removal   06/21/2014 Pathology Results   Bone, biopsy, left lumbar paraspinal - METASTATIC POORLY DIFFERENTIATED CARCINOMA, CONSISTENT WITH HIGH GRADE SEROUS CARCINOMA SEE COMMENT. Microscopic Comment The carcinoma demonstrates the following immunophenotype: Cytokeratin 7 - patchy moderate to strong expression Estrogen receptor - negative expression P53 - strong diffuse expression TTF-1 - negative expression WT-1 - negative expression CD56 - focal moderate strong expression Synaptophysin - negative expression GCDFP - negative expression The history of primary endometrial papillary serous carcinoma and primary mammary carcinoma is noted. In the current case, the overall morphologic and immunophenotype are that of poorly differentiated carcinoma, consistent with high grade serous carcinoma.    07/11/2014 - 08/12/2014 Radiation Therapy   07/11/14-08/12/14: 55 Gy in 25 fractions to the Lumbar spine   08/15/2014 Imaging   CT scan of chest, abdomen and pelvis 1. Since the biopsy study of 06/21/2014, similar to slight decrease in size of a left paraspinous lesion at L3-4. 2. No new sites of metastatic disease identified. 3. 8 mm ground-glass nodule in the right lower lobe is grossly similar to 03/10/2013, suggesting a benign etiology. 4. Cholelithiasis. 5. Apparent sigmoid colonic wall thickening which could be due to underdistention. Colitis felt less likely. 6.  Atherosclerosis, including within the coronary arteries. 7. Pelvic floor laxity.   10/07/2014 Imaging   CT scan of chest, abdomen and pelvis: Stable to slight interval decrease in size of left paraspinous lesion at L3-4. No new sites of metastatic disease. Unchanged ground-glass nodule within the right lower lobe, potentially benign in etiology. Recommend attention on followup. Cholelithiasis. Sigmoid colonic diverticulosis. No  CT evidence to suggest acute diverticulitis.   01/19/2015 Imaging   Ct scan of abdomen and pelvis 1. Decrease in size of left paraspinous metastasis. 2. No new sites of disease. 3. Stable ground-glass attenuating nodule within the superior segment of right lower lobe. 4. Gallstones   05/02/2015 Imaging   Ct abdomen and pelvis: Slight interval increase in size of left lumbar paraspinal soft tissue metastasis. No new sites of metastatic disease identified within the abdomen or pelvis.    05/11/2015 PET scan   1. The small soft tissue mass just lateral to the left L3 for neural foramen is hypermetabolic, favoring malignancy. 2. There is also a small focus of hypermetabolic activity between the left L1 and L2 transverse processes, but without a CT correlate. 3. The 13 mm sub solid nodule in the superior segment right lower lobe is stable from recent exams but has slowly increased in size over the last 7 years. Although not hypermetabolic, the appearance is concerning for the possibility of a low grade adenocarcinoma. 4. Other imaging findings of potential clinical significance: Chronic bilateral maxillary sinusitis. Coronary, aortic arch, and branch vessel atherosclerotic vascular disease. Aortoiliac atherosclerotic vascular disease. Cholelithiasis. Sigmoid colon diverticulosis. Lumbar scoliosis.   07/11/2015 Imaging   Ct scan of chest, abdomen and pelvis: 1.  13 mm mixed solid and sub solid nodule in the posterior right lower lobe was present in 2009 and has clearly progressed in the interval since that study. Imaging features remain highly concerning for low-grade or well differentiated adenocarcinoma. 2. Slight increase size of in the hypermetabolic, rim enhancing nodule identified adjacent to the left L3-4 neural foramen. Metastatic disease remains a concern. 3. No evidence for discrete soft tissue lesion between the left L1 and 2 transverse processes, the site of focal FDG uptake on the recent  PET-CT. 4. Cholelithiasis. 5. Abdominal aortic atherosclerosis   08/03/2015 - 01/16/2016 Chemotherapy   She received carboplatin and Taxol   11/06/2015 PET scan   1. Hypermetabolic left N4-0 paraspinous metastasis (biopsy-proven) is stable in size and slightly decreased in metabolism. 2. Previously described small focus of hypermetabolism between the left L2 and L3 spinous processes has resolved. 3. New mild linear hypermetabolism to the left of the T11-12 spinous processes without discrete mass on the CT images, favor benign activity related activity, recommend attention on follow-up PET-CT.  4. No definite new sites of hypermetabolic metastatic disease. No recurrent hypermetabolic metastatic disease in the pelvis. 5. Interval stability of subsolid 1.3 cm superior segment right lower lobe pulmonary nodule without associated significant metabolism, which has grown compared to the 2009 chest CT study, and remain suspicious for low grade adenocarcinoma. 6. Additional findings include aortic atherosclerosis, coronary atherosclerosis, mild multinodular goiter with no hypermetabolic thyroid nodules, cholelithiasis and moderate sigmoid diverticulosis.   02/12/2016 PET scan   1. Interval increase in size and metabolic activity of the LEFT paraspinal soft tissue metastasis. 2. New activity within the musculature of the LEFT chest wall. Given the unusual location of the paraspinal metastasis cannot exclude a second soft tissue metastasis to the musculature however favor benign physiologic activity. 3. Stable RIGHT lower lobe pulmonary nodule. 4. No evidence of local recurrence.     04/08/2016 Imaging   Ct scan of chest, abdomen and pelvis: 1. Similar size of a  left paravertebral abdominal lesion. 2. No new or progressive metastatic disease. 3. No correlate for the muscular activity about the lateral left chest wall. 4.  Coronary artery atherosclerosis. Aortic atherosclerosis. 5. Cholelithiasis. 6.  Similar right lower lobe pulmonary nodule.   06/03/2016 Imaging   CT abdomen and pelvis 1. Similar to mild enlargement of a paravertebral soft tissue lesion at the L3-4 level. 2. No new sites of disease identified. 3. Cholelithiasis. 4. Hysterectomy. 5.  Aortic atherosclerosis.    09/02/2016 Imaging   CT abdomen and pelvis 1. Continue mild interval increase in size of LEFT paraspinal mass (1-2 mm). 2. No evidence of new metastatic disease in the abdomen pelvis. 3. Post hysterectomy anatomy   09/26/2016 Imaging   MR lumbar spine 1. Left paraspinal metastasis with epicenter adjacent to the left L3 neural foramen does extend into the foramen to the level of the left lateral epidural space (series 9, image 31), but also tracks cephalad and caudal along the L2-L4 lumbar plexus (series 10, image 15). No extension into the left L2 or L4 neural foramina. And no more distal extension of tumor. 2. Abnormal signal in the medial left psoas and ventral left erector spinae muscles at L2 and L3 is probably denervation related. 3. No bone invasion or osseous metastatic disease. Suspect previous radiation of the L2 through L5 spinal levels. 4. No dural or intradural metastatic disease.    10/22/2016 Procedure   She underwent stereotactic Radiosurgery   10/30/2016 Genetic Testing  Patient has genetic testing done for Inheritable genetic mutation panel. Results revealed patient has no actionable mutation.   01/21/2017 Imaging   1. Reduced size and conspicuity of the left paraspinal mass at the L3-4 level. The mass still present but has enhancement similar to that of adjacent psoas musculature. 2. No new metastatic disease is identified. 3. Other imaging findings of potential clinical significance: Cholelithiasis. Aortic Atherosclerosis (ICD10-I70.0). Sigmoid diverticulosis. Lumbar scoliosis.   05/09/2017 Imaging   Ct scan of abdomen and pelvis 1. Paraspinal mass adjacent to the left side of L3-L4 is  less distinct than prior examinations, and is therefore difficult to discretely measure, however, the overall appearance suggests continued positive response to therapy. 2. No new signs of metastatic disease elsewhere in the abdomen or pelvis. 3. Aortic atherosclerosis, in addition to at least right coronary artery disease. Assessment for potential risk factor modification, dietary therapy or pharmacologic therapy may be warranted, if clinically indicated. 4. Colonic diverticulosis without evidence of acute diverticulitis at this time. 5. Additional incidental findings, as above. Aortic Atherosclerosis (ICD10-I70.0).   08/14/2017 Imaging   1. Treated left paraspinal mass centered at L3-4. Mass size is stable but there has been some evolution of tumor characteristics with less central fluid seen today. Recommend continued surveillance. 2. No evidence of untreated metastasis. 3. Degenerative changes and related impingement are described above.   10/28/2017 Imaging   Stable small left paraspinal soft tissue mass. No new or progressive disease within the abdomen or pelvis.  Cholelithiasis.  No radiographic evidence of cholecystitis.  Colonic diverticulosis, without radiographic evidence of diverticulitis.   04/30/2018 Imaging   Stable post treatment change of the partially necrotic LEFT paravertebral mass at L3-L4. 18 x 21 mm cross-section. Mass effect on the LEFT L3 nerve root redemonstrated. Enhancing and atrophic LEFT psoas is inseparable.   10/15/2018 Imaging   1. Stable post treatment size and appearance of partially necrotic left paravertebral mass at L3-4, measuring 19 x 25 mm on current exam (unchanged when measured at similar level on previous study). Mass effect on the adjacent left L3 nerve root is unchanged. Adjacent enhancing and atrophic left psoas muscle. 2. Left convex scoliosis with associated multilevel degenerative spondylolysis and facet arthrosis, unchanged   10/29/2018 Imaging    Stable small left L3 paraspinal soft tissue mass. No evidence of new or progressive metastatic disease, or other acute findings.   Cholelithiasis.  No radiographic evidence of cholecystitis.   Colonic diverticulosis, without radiographic evidence of diverticulitis.   11/11/2018 PET scan   1. Low level metabolism (max SUV 2.0) associated with the irregular subsolid superior segment right lower lobe 2.1 cm pulmonary nodule. As better depicted on the recent diagnostic chest CT study, this nodule crosses the major fissure into the right upper lobe and has increased in size and density on multiple imaging studies back to 2009. Findings are most suggestive of primary bronchogenic adenocarcinoma. 2. No hypermetabolic thoracic adenopathy. 3. Persistent hypermetabolism (max SUV 8.0) associated with the known left L3-4 paraspinous metastasis, mildly decreased in metabolism since 02/12/2016 PET-CT. 4. No additional sites of hypermetabolic metastatic disease in the abdomen or pelvis. No metabolic evidence of peritoneal recurrence. No ascites. 5. Chronic findings include: Aortic Atherosclerosis (ICD10-I70.0). Coronary atherosclerosis. Chronic bilateral maxillary sinusitis. Cholelithiasis. Moderate sigmoid diverticulosis.   01/07/2019 - 04/04/2019 Chemotherapy   The patient had carboplatin and Alimta for chemotherapy treatment.     05/03/2019 -  Chemotherapy   The patient had Pembrolizumab and Lenvima for chemotherapy treatment.  07/23/2019 PET scan   1. 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 No new sites of hypermetabolic metastatic disease on today's study. 2. Stable appearance of surgical changes in the right lung without features to suggest local recurrence or metastatic lung cancer. 3. Cholelithiasis. 4.  Aortic Atherosclerois (ICD10-170.0)   Metastatic cancer to bone ([place])  06/27/2014 Initial Diagnosis   Metastatic cancer to bone    Primary lung cancer, right ([place])  11/04/2018 Initial Diagnosis   Primary lung cancer, right ([place])   11/11/2018 PET scan   1. Low level metabolism (max SUV 2.0) associated with the irregular subsolid superior segment right lower lobe 2.1 cm pulmonary nodule. As better depicted on the recent diagnostic chest CT study, this nodule crosses the major fissure into the right upper lobe and has increased in size and density on multiple imaging studies back to 2009. Findings are most suggestive of primary bronchogenic adenocarcinoma. 2. No hypermetabolic thoracic adenopathy. 3. Persistent hypermetabolism (max SUV 8.0) associated with the known left L3-4 paraspinous metastasis, mildly decreased in metabolism since 02/12/2016 PET-CT. 4. No additional sites of hypermetabolic metastatic disease in the abdomen or pelvis. No metabolic evidence of peritoneal recurrence. No ascites. 5. Chronic findings include: Aortic Atherosclerosis (ICD10-I70.0). Coronary atherosclerosis. Chronic bilateral maxillary sinusitis. Cholelithiasis. Moderate sigmoid diverticulosis.   12/07/2018 Pathology Results   1. Lung, resection (segmental or lobe), Right Lobe Superior with endblock wedge of right upper lobe - INVASIVE ADENOCARCINOMA, MODERATELY DIFFERENTIATED, SPANNING 1.6 CM. - THE SURGICAL RESECTION MARGINS ARE NEGATIVE FOR CARCINOMA. - SEE ONCOLOGY TABLE BELOW. 2. Lymph node, biopsy, Level 9 #1 - THERE IS NO EVIDENCE OF CARCINOMA IN 1 OF 1 LYMPH NODE (0/1). 3. Lymph node, biopsy, Level 9 #2 - THERE IS NO EVIDENCE OF CARCINOMA IN 1 OF 1 LYMPH NODE (0/1). 4. Lymph node, biopsy, Level 7 #1 - METASTATIC CARCINOMA IN 1 OF 1 LYMPH NODE (1/1). 5. Lymph node, biopsy, Level 7 #2 - THERE IS NO EVIDENCE OF CARCINOMA IN 1 OF 1 LYMPH NODE (0/1). 6. Lymph node, biopsy, Level 12 #1 - THERE IS NO EVIDENCE OF CARCINOMA IN 1 OF 1 LYMPH NODE (0/1). 7. Lymph node, biopsy, Level 12 #2 - THERE IS NO EVIDENCE OF CARCINOMA IN 1 OF 1 LYMPH NODE  (0/1). 8. Lymph node, biopsy, Level 10 #1 - THERE IS NO EVIDENCE OF CARCINOMA IN 1 OF 1 LYMPH NODE (0/1). 9. Lymph node, biopsy, Level 4R #1 - THERE IS NO EVIDENCE OF CARCINOMA IN 1 OF 1 LYMPH NODE (0/1). 10. Lymph node, biopsy, Level 4R #2 - THERE IS NO EVIDENCE OF CARCINOMA IN 1 OF 1 LYMPH NODE (0/1). 11. Lymph node, biopsy, Level 2R #1 - THERE IS NO EVIDENCE OF CARCINOMA IN 1 OF 1 LYMPH NODE (0/1). 12. Lung, resection (segmental or lobe), Right Lobe Superior - BENIGN LUNG PARENCHYMA. - THERE IS NO EVIDENCE OF MALIGNANCY. Procedure: Segmentectomy. Specimen Laterality: Right. Tumor Site: Right superior lobe. Tumor Size: 1.6 cm (gross measurement). Tumor Focality: Unifocal. Histologic Type: Adenocarcinoma, moderately differentiated. Visceral Pleura Invasion: Not identified. Lymphovascular Invasion: Not identified. Direct Invasion of Adjacent Structures: Not identified. Margins: Negative for carcinoma. Treatment Effect: N/A Regional Lymph Nodes: Number of Lymph Nodes Involved: 1 (level 7) Number of Lymph Nodes Examined: 10 Pathologic Stage Classification (pTNM, AJCC 8th Edition): pT1b, pN2 Ancillary Studies: The tumor cells are positive for Napsin-A, TTF-1, and p53. They are essentially negative for estrogen receptor, PAX-8 and progesterone receptor. Additional studies can be  performed upon clinician request. Representative Tumor Block: 1D-1E. (JBK:gt, 12/09/18)   12/07/2018 Surgery   PREOPERATIVE DIAGNOSIS:  Right lung nodule.   POSTOPERATIVE DIAGNOSIS:  Non-small cell carcinoma- suspected primary lung carcinoma, clinical stage T2N01b.   PROCEDURE:   Right video-assisted thoracoscopy, Right lower lobe superior segmentectomy with en bloc wedge resection of right upper lobe, Lymph node dissection, Intercostal nerve block levels 3-9.   SURGEON:  Modesto Charon, MD   FINDINGS:  Mass palpable in posterior aspect of superior segment of right lower lobe, likely involvement  across fissure into the upper lobe.  Frozen section revealed non-small cell carcinoma. upper and lower lobe margins clear.   12/23/2018 Cancer Staging   Staging form: Lung, AJCC 8th Edition - Pathologic: Stage IIIA (pT1b, pN2, cM0) - Signed by Heath Lark, MD on 12/23/2018   01/05/2019 Imaging   MRI brain 1. No metastatic disease or acute intracranial abnormality identified. 2. Small chronic parafalcine meningioma size is stable since that described in 2006 (12 x 15 mm). 3. Advanced signal changes in the cerebral white matter and to a lesser extent pons, nonspecific but most commonly due to chronic small vessel disease. 4. Partially visible degenerative cervical spinal stenosis.     01/07/2019 -  Chemotherapy   The patient had carboplatin and Alimta for chemotherapy treatment.     04/14/2019 PET scan   1. 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 As shown on recent MRI of 04/01/2019, there is a new component of invasion into the left lower L3 vertebral body. This new component has a maximum SUV of 10.0, compatible with active malignancy. 2. Interval wedge resection of portions of the right upper lobe and right lower lobe at the site of the prior nodule. There is only minimal low-grade activity along the wedge resection clips, maximum SUV of 1.5. Surveillance is likely warranted. No new nodule identified. 3. Other imaging findings of potential clinical significance: Aortic Atherosclerosis (ICD10-I70.0). Coronary atherosclerosis. Thoracolumbar scoliosis. Cholelithiasis. Sigmoid colon diverticulosis.     05/03/2019 -  Chemotherapy   The patient had Pembrolizumab and Lenvima for chemotherapy treatment.     05/22/2019 Imaging   Ct head Atrophy, chronic microvascular disease.   No acute intracranial abnormality.       REVIEW OF SYSTEMS:   Constitutional: Denies fevers, chills or abnormal weight loss Eyes: Denies blurriness of vision Ears, nose, mouth, throat,  and face: Denies mucositis or sore throat Respiratory: Denies cough, dyspnea or wheezes Cardiovascular: Denies palpitation, chest discomfort or lower extremity swelling Gastrointestinal:  Denies nausea, heartburn or change in bowel habits Skin: Denies abnormal skin rashes Lymphatics: Denies new lymphadenopathy or easy bruising Neurological:Denies numbness, tingling or new weaknesses Behavioral/Psych: Mood is stable, no new changes  All other systems were reviewed with the patient and are negative.  I have reviewed the past medical history, past surgical history, social history and family history with the patient and they are unchanged from previous note.  ALLERGIES:  is allergic to ace inhibitors; dilaudid [hydromorphone hcl]; and codeine.  MEDICATIONS:  Current Outpatient Medications  Medication Sig Dispense Refill  . amLODipine (NORVASC) 5 MG tablet Take 5 mg by mouth daily.    Marland Kitchen aspirin EC 81 MG tablet Take 81 mg by mouth daily.    Marland Kitchen atenolol (TENORMIN) 25 MG tablet Take 25 mg by mouth 2 (two) times daily.     . folic acid (FOLVITE) 1 MG tablet Take 1 mg by mouth once a week.    Marland Kitchen lenvatinib  10 mg daily dose (LENVIMA, 10 MG DAILY DOSE,) capsule Take 1 capsule (10 mg total) by mouth daily. 30 capsule 11  . levothyroxine (SYNTHROID) 25 MCG tablet Take 1 tablet (25 mcg total) by mouth daily before breakfast. 30 tablet 11  . lidocaine-prilocaine (EMLA) cream Apply to [facility] cath 1-2 hours prior to access as directed 30 g 1  . LORazepam (ATIVAN) 0.5 MG tablet 1 tablet po 30 minutes prior to radiation or MRI 30 tablet 0  . Multiple Vitamin (MULTIVITAMIN WITH MINERALS) TABS tablet Take 1 tablet by mouth daily.    . ondansetron (ZOFRAN) 8 MG tablet PLEASE SEE ATTACHED FOR DETAILED DIRECTIONS    . simvastatin (ZOCOR) 20 MG tablet Take 20 mg by mouth every evening.      No current facility-administered medications for this visit.   Facility-Administered Medications Ordered in Other Visits   Medication Dose Route Frequency Provider Last Rate Last Admin  . heparin lock flush 100 unit/mL  500 Units Intracatheter Once Alvy Bimler, Ramonte Mena, MD      . sodium chloride flush (NS) 0.9 % injection 10 mL  10 mL Intracatheter Once Alvy Bimler, Jamesia Linnen, MD        PHYSICAL EXAMINATION: ECOG PERFORMANCE STATUS: 1 - Symptomatic but completely ambulatory  Vitals:   07/26/19 0929  BP: 140/60  Pulse: 78  Resp: 18  Temp: 97.8 F (36.6 C)  SpO2: 100%   Filed Weights   07/26/19 0929  Weight: 107 lb 9.6 oz (48.8 kg)    GENERAL:alert, no distress and comfortable Musculoskeletal:no cyanosis of digits and no clubbing  NEURO: alert & oriented x 3 with fluent speech, no focal motor/sensory deficits  LABORATORY DATA:  I have reviewed the data as listed    Component Value Date/Time   NA 143 07/26/2019 0854   NA 140 05/09/2017 0844   K 4.0 07/26/2019 0854   K 4.1 05/09/2017 0844   CL 107 07/26/2019 0854   CO2 25 07/26/2019 0854   CO2 26 05/09/2017 0844   GLUCOSE 104 (H) 07/26/2019 0854   GLUCOSE 82 05/09/2017 0844   BUN 24 (H) 07/26/2019 0854   BUN 20.7 05/09/2017 0844   CREATININE 0.97 07/26/2019 0854   CREATININE 0.7 05/09/2017 0844   CALCIUM 9.6 07/26/2019 0854   CALCIUM 9.3 05/09/2017 0844   PROT 7.1 07/26/2019 0854   PROT 6.8 05/09/2017 0844   ALBUMIN 3.9 07/26/2019 0854   ALBUMIN 3.6 05/09/2017 0844   AST 39 07/26/2019 0854   AST 27 05/09/2017 0844   ALT 33 07/26/2019 0854   ALT 17 05/09/2017 0844   ALKPHOS 49 07/26/2019 0854   ALKPHOS 43 05/09/2017 0844   BILITOT 1.0 07/26/2019 0854   BILITOT 0.81 05/09/2017 0844   GFRNONAA 56 (L) 07/26/2019 0854   GFRAA >60 07/26/2019 0854    No results found for: SPEP, UPEP  Lab Results  Component Value Date   WBC 5.8 07/26/2019   NEUTROABS 3.0 07/26/2019   HGB 12.7 07/26/2019   HCT 36.6 07/26/2019   MCV 96.3 07/26/2019   PLT 144 (L) 07/26/2019      Chemistry      Component Value Date/Time   NA 143 07/26/2019 0854   NA 140  05/09/2017 0844   K 4.0 07/26/2019 0854   K 4.1 05/09/2017 0844   CL 107 07/26/2019 0854   CO2 25 07/26/2019 0854   CO2 26 05/09/2017 0844   BUN 24 (H) 07/26/2019 0854   BUN 20.7 05/09/2017 0844   CREATININE 0.97 07/26/2019  0854   CREATININE 0.7 05/09/2017 0844      Component Value Date/Time   CALCIUM 9.6 07/26/2019 0854   CALCIUM 9.3 05/09/2017 0844   ALKPHOS 49 07/26/2019 0854   ALKPHOS 43 05/09/2017 0844   AST 39 07/26/2019 0854   AST 27 05/09/2017 0844   ALT 33 07/26/2019 0854   ALT 17 05/09/2017 0844   BILITOT 1.0 07/26/2019 0854   BILITOT 0.81 05/09/2017 0844       RADIOGRAPHIC STUDIES: I have reviewed PET CT scan with the patient I have personally reviewed the radiological images as listed and agreed with the findings in the report. MR Lumbar Spine W Wo Contrast  Result Date: 07/15/2019 CLINICAL DATA:  History of metastatic non-small lung carcinoma and endometrial cancer. Paravertebral metastatic deposit at the level of L3. EXAM: MRI LUMBAR SPINE WITHOUT AND WITH CONTRAST TECHNIQUE: Multiplanar and multiecho pulse sequences of the lumbar spine were obtained without and with intravenous contrast. CONTRAST:  4 mL GADAVIST IV SOLN COMPARISON:  PET CT scan 04/14/2019. MRI lumbar spine 04/01/2019, 10/15/2018 and 04/30/2018. FINDINGS: Segmentation:  Standard. Alignment: 0.6 cm anterolisthesis L4 on L5 and convex right scoliosis. Alignment is unchanged. Vertebrae: Again seen is decreased T1, increased T2 signal and postcontrast enhancement in the L3 vertebral body inferiorly and to the left. The anterior margin of the vertebral body is spared. No fracture is identified. Increased T1 and T2 signal in the anterior aspect of L3, throughout L4 and L5 and into the sacrum is consistent with prior radiation therapy. No new lesion is identified. Conus medullaris and cauda equina: Conus extends to the L1-2 level. Conus and cauda equina appear normal. Paraspinal and other soft tissues:  Paravertebral soft tissue mass lesion on the left at L3 which extends into the left foramen and vertebral body demonstrates decreased enhancement compared to the prior MRI. The lesion measures approximately 2 x 1.5 cm on image 20 of series 15 compared to 2.2 x 1.7 cm on the most recent MRI. The cystic component in the lesion seen on most recent MRI is not present today. Disc levels: Multilevel spondylosis, worst at L4-5 is unchanged. IMPRESSION: Left paraspinous metastatic deposit at L3 appears improved compared to the most recent MRI. Edema and enhancement in the L3 vertebral body consistent with metastatic disease is unchanged. No new abnormality. Electronically Signed   By: Inge Rise M.D.   On: 07/15/2019 12:03   NM PET Image Restag (PS) Skull Base To Thigh  Result Date: 07/23/2019 CLINICAL DATA:  Subsequent treatment strategy for endometrial cancer. History of non-small-cell lung cancer. EXAM: NUCLEAR MEDICINE PET SKULL BASE TO THIGH TECHNIQUE: 5.0 mCi F-18 FDG was injected intravenously. Full-ring PET imaging was performed from the skull base to thigh after the radiotracer. CT data was obtained and used for attenuation correction and anatomic localization. Fasting blood glucose: 84 mg/dl COMPARISON:  04/14/2019 FINDINGS: Mediastinal blood pool activity: SUV max 1.9 Liver activity: SUV max NA NECK: No hypermetabolic lymph nodes in the neck. Incidental CT findings: none CHEST: No hypermetabolic mediastinal or hilar nodes. No suspicious pulmonary nodules on the CT scan. Muscular uptake in the proximal right arm may be related to movement after FDG injection. Incidental CT findings: Left Port-A-Cath tip is in the proximal to mid SVC. Atherosclerotic calcification is noted in the wall of the thoracic aorta. Surgical clips noted right axilla stable volume loss right hemithorax surgical changes parahilar right lung similar to prior. No pleural effusion. ABDOMEN/PELVIS: No abnormal hypermetabolic activity  within the liver,  pancreas, adrenal glands, or spleen. No hypermetabolic lymph nodes in the abdomen or pelvis. Incidental CT findings: Calcified gallstones again noted. There is abdominal aortic atherosclerosis without aneurysm. Diverticular changes noted in the left colon without features of diverticulitis. SKELETON: The Incidental CT findings: The seen previously on the left at L3-4 has decreased in the interval with SUV max = 4.8 today compared to 10.0 previously. Left paravertebral component measured with SUV max = 7.9 previously is now 2.7. No new sites of skeletal hypermetabolism on today's study. IMPRESSION: 1. 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 No new sites of hypermetabolic metastatic disease on today's study. 2. Stable appearance of surgical changes in the right lung without features to suggest local recurrence or metastatic lung cancer. 3. Cholelithiasis. 4.  Aortic Atherosclerois (ICD10-170.0) Electronically Signed   By: Misty Stanley M.D.   On: 07/23/2019 13:51

## 2019-07-27 NOTE — Assessment & Plan Note (Signed)
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

## 2019-07-27 NOTE — Telephone Encounter (Signed)
Called and left below message. Ask her to call the office for questions. ?

## 2019-07-27 NOTE — Telephone Encounter (Signed)
-----   Message from Heath Lark, MD sent at 07/27/2019  8:23 AM EDT ----- Regarding: urine test Pls call and let her know urine culture not consistent with UTI Drink more water for now

## 2019-07-28 ENCOUNTER — Telehealth: Payer: Self-pay | Admitting: Hematology and Oncology

## 2019-07-28 NOTE — Telephone Encounter (Signed)
Scheduled per 3/16 sch msg. Called and left a msg. Mailing printout

## 2019-08-10 NOTE — Progress Notes (Signed)
Pharmacist Chemotherapy Monitoring - Follow Up Assessment    I verify that I have reviewed each item in the below checklist:  . Regimen for the patient is scheduled for the appropriate day and plan matches scheduled date. Marland Kitchen Appropriate non-routine labs are ordered dependent on drug ordered. . If applicable, additional medications reviewed and ordered per protocol based on lifetime cumulative doses and/or treatment regimen.   Plan for follow-up and/or issues identified: No . I-vent associated with next due treatment: No . MD and/or nursing notified: No  Randon Somera D 08/10/2019 11:56 AM

## 2019-08-16 ENCOUNTER — Inpatient Hospital Stay: Payer: Medicare Other

## 2019-08-16 ENCOUNTER — Other Ambulatory Visit: Payer: Self-pay | Admitting: Radiation Oncology

## 2019-08-16 ENCOUNTER — Encounter: Payer: Self-pay | Admitting: Hematology and Oncology

## 2019-08-16 ENCOUNTER — Other Ambulatory Visit: Payer: Self-pay

## 2019-08-16 ENCOUNTER — Inpatient Hospital Stay: Payer: Medicare Other | Attending: Hematology and Oncology | Admitting: Hematology and Oncology

## 2019-08-16 DIAGNOSIS — C541 Malignant neoplasm of endometrium: Secondary | ICD-10-CM

## 2019-08-16 DIAGNOSIS — Z923 Personal history of irradiation: Secondary | ICD-10-CM | POA: Insufficient documentation

## 2019-08-16 DIAGNOSIS — K8020 Calculus of gallbladder without cholecystitis without obstruction: Secondary | ICD-10-CM | POA: Insufficient documentation

## 2019-08-16 DIAGNOSIS — R7989 Other specified abnormal findings of blood chemistry: Secondary | ICD-10-CM | POA: Diagnosis not present

## 2019-08-16 DIAGNOSIS — J320 Chronic maxillary sinusitis: Secondary | ICD-10-CM | POA: Insufficient documentation

## 2019-08-16 DIAGNOSIS — I700 Atherosclerosis of aorta: Secondary | ICD-10-CM | POA: Insufficient documentation

## 2019-08-16 DIAGNOSIS — Z5112 Encounter for antineoplastic immunotherapy: Secondary | ICD-10-CM | POA: Insufficient documentation

## 2019-08-16 DIAGNOSIS — C7951 Secondary malignant neoplasm of bone: Secondary | ICD-10-CM | POA: Diagnosis not present

## 2019-08-16 DIAGNOSIS — K5730 Diverticulosis of large intestine without perforation or abscess without bleeding: Secondary | ICD-10-CM | POA: Diagnosis not present

## 2019-08-16 DIAGNOSIS — R5383 Other fatigue: Secondary | ICD-10-CM | POA: Diagnosis not present

## 2019-08-16 DIAGNOSIS — I2510 Atherosclerotic heart disease of native coronary artery without angina pectoris: Secondary | ICD-10-CM | POA: Insufficient documentation

## 2019-08-16 DIAGNOSIS — D701 Agranulocytosis secondary to cancer chemotherapy: Secondary | ICD-10-CM

## 2019-08-16 DIAGNOSIS — Z7189 Other specified counseling: Secondary | ICD-10-CM

## 2019-08-16 DIAGNOSIS — Z885 Allergy status to narcotic agent status: Secondary | ICD-10-CM | POA: Diagnosis not present

## 2019-08-16 DIAGNOSIS — Z9221 Personal history of antineoplastic chemotherapy: Secondary | ICD-10-CM | POA: Diagnosis not present

## 2019-08-16 DIAGNOSIS — C3431 Malignant neoplasm of lower lobe, right bronchus or lung: Secondary | ICD-10-CM | POA: Diagnosis present

## 2019-08-16 DIAGNOSIS — C3411 Malignant neoplasm of upper lobe, right bronchus or lung: Secondary | ICD-10-CM | POA: Insufficient documentation

## 2019-08-16 DIAGNOSIS — Z79899 Other long term (current) drug therapy: Secondary | ICD-10-CM | POA: Insufficient documentation

## 2019-08-16 DIAGNOSIS — E039 Hypothyroidism, unspecified: Secondary | ICD-10-CM | POA: Diagnosis not present

## 2019-08-16 DIAGNOSIS — C549 Malignant neoplasm of corpus uteri, unspecified: Secondary | ICD-10-CM

## 2019-08-16 DIAGNOSIS — R0602 Shortness of breath: Secondary | ICD-10-CM | POA: Diagnosis not present

## 2019-08-16 DIAGNOSIS — Z853 Personal history of malignant neoplasm of breast: Secondary | ICD-10-CM

## 2019-08-16 DIAGNOSIS — I10 Essential (primary) hypertension: Secondary | ICD-10-CM

## 2019-08-16 DIAGNOSIS — Z95828 Presence of other vascular implants and grafts: Secondary | ICD-10-CM

## 2019-08-16 DIAGNOSIS — R590 Localized enlarged lymph nodes: Secondary | ICD-10-CM | POA: Insufficient documentation

## 2019-08-16 LAB — CBC WITH DIFFERENTIAL (CANCER CENTER ONLY)
Abs Immature Granulocytes: 0.03 10*3/uL (ref 0.00–0.07)
Basophils Absolute: 0.1 10*3/uL (ref 0.0–0.1)
Basophils Relative: 1 %
Eosinophils Absolute: 0.5 10*3/uL (ref 0.0–0.5)
Eosinophils Relative: 9 %
HCT: 38 % (ref 36.0–46.0)
Hemoglobin: 13 g/dL (ref 12.0–15.0)
Immature Granulocytes: 1 %
Lymphocytes Relative: 23 %
Lymphs Abs: 1.3 10*3/uL (ref 0.7–4.0)
MCH: 33.4 pg (ref 26.0–34.0)
MCHC: 34.2 g/dL (ref 30.0–36.0)
MCV: 97.7 fL (ref 80.0–100.0)
Monocytes Absolute: 0.8 10*3/uL (ref 0.1–1.0)
Monocytes Relative: 14 %
Neutro Abs: 3 10*3/uL (ref 1.7–7.7)
Neutrophils Relative %: 52 %
Platelet Count: 137 10*3/uL — ABNORMAL LOW (ref 150–400)
RBC: 3.89 MIL/uL (ref 3.87–5.11)
RDW: 13.7 % (ref 11.5–15.5)
WBC Count: 5.8 10*3/uL (ref 4.0–10.5)
nRBC: 0 % (ref 0.0–0.2)

## 2019-08-16 LAB — CMP (CANCER CENTER ONLY)
ALT: 29 U/L (ref 0–44)
AST: 35 U/L (ref 15–41)
Albumin: 3.7 g/dL (ref 3.5–5.0)
Alkaline Phosphatase: 55 U/L (ref 38–126)
Anion gap: 13 (ref 5–15)
BUN: 20 mg/dL (ref 8–23)
CO2: 24 mmol/L (ref 22–32)
Calcium: 9.6 mg/dL (ref 8.9–10.3)
Chloride: 104 mmol/L (ref 98–111)
Creatinine: 1.16 mg/dL — ABNORMAL HIGH (ref 0.44–1.00)
GFR, Est AFR Am: 52 mL/min — ABNORMAL LOW (ref >60)
GFR, Estimated: 45 mL/min — ABNORMAL LOW (ref >60)
Glucose, Bld: 118 mg/dL — ABNORMAL HIGH (ref 70–99)
Potassium: 4 mmol/L (ref 3.5–5.1)
Sodium: 141 mmol/L (ref 135–145)
Total Bilirubin: 1.5 mg/dL — ABNORMAL HIGH (ref 0.3–1.2)
Total Protein: 7 g/dL (ref 6.5–8.1)

## 2019-08-16 LAB — TOTAL PROTEIN, URINE DIPSTICK: Protein, ur: 30 mg/dL — AB

## 2019-08-16 LAB — TSH: TSH: 0.239 u[IU]/mL — ABNORMAL LOW (ref 0.308–3.960)

## 2019-08-16 MED ORDER — SODIUM CHLORIDE 0.9% FLUSH
10.0000 mL | INTRAVENOUS | Status: DC | PRN
  Administered 2019-08-16: 10 mL
  Filled 2019-08-16: qty 10

## 2019-08-16 MED ORDER — SODIUM CHLORIDE 0.9% FLUSH
10.0000 mL | Freq: Once | INTRAVENOUS | Status: AC
  Administered 2019-08-16: 10 mL
  Filled 2019-08-16: qty 10

## 2019-08-16 MED ORDER — SODIUM CHLORIDE 0.9 % IV SOLN
200.0000 mg | Freq: Once | INTRAVENOUS | Status: AC
  Administered 2019-08-16: 200 mg via INTRAVENOUS
  Filled 2019-08-16: qty 8

## 2019-08-16 MED ORDER — HEPARIN SOD (PORK) LOCK FLUSH 100 UNIT/ML IV SOLN
500.0000 [IU] | Freq: Once | INTRAVENOUS | Status: AC | PRN
  Administered 2019-08-16: 500 [IU]
  Filled 2019-08-16: qty 5

## 2019-08-16 MED ORDER — SODIUM CHLORIDE 0.9 % IV SOLN
Freq: Once | INTRAVENOUS | Status: AC
  Filled 2019-08-16: qty 250

## 2019-08-16 NOTE — Assessment & Plan Note (Signed)
Her last PET scan showed excellent response to treatment She tolerated treatment well without major side effects The plan will be to continue treatment indefinitely Her next imaging study would be in September

## 2019-08-16 NOTE — Assessment & Plan Note (Signed)
Her blood pressure control at home is satisfactory Urine protein is positive She will continue current blood pressure prescription for now She is reminded to call me if her blood pressure control at home is not good

## 2019-08-16 NOTE — Patient Instructions (Signed)
[facility] Discharge Instructions for Patients Receiving Chemotherapy  Today you received the following chemotherapy agents: pembrolizumab.  To help prevent nausea and vomiting after your treatment, we encourage you to take your nausea medication as directed.   If you develop nausea and vomiting that is not controlled by your nausea medication, call the clinic.   BELOW ARE SYMPTOMS THAT SHOULD BE REPORTED IMMEDIATELY:  *FEVER GREATER THAN 100.5 F  *CHILLS WITH OR WITHOUT FEVER  NAUSEA AND VOMITING THAT IS NOT CONTROLLED WITH YOUR NAUSEA MEDICATION  *UNUSUAL SHORTNESS OF BREATH  *UNUSUAL BRUISING OR BLEEDING  TENDERNESS IN MOUTH AND THROAT WITH OR WITHOUT PRESENCE OF ULCERS  *URINARY PROBLEMS  *BOWEL PROBLEMS  UNUSUAL RASH Items with * indicate a potential emergency and should be followed up as soon as possible.  Feel free to call the clinic should you have any questions or concerns. The clinic phone number is [phone].  Please show the CHEMO ALERT CARD at check-in to the Emergency Department and triage nurse.   

## 2019-08-16 NOTE — Assessment & Plan Note (Signed)
She has mild intermittent elevated serum creatinine We will monitor carefully I will continue to manage her blood pressure aggressively

## 2019-08-16 NOTE — Patient Instructions (Signed)

## 2019-08-16 NOTE — Assessment & Plan Note (Signed)
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

## 2019-08-16 NOTE — Progress Notes (Signed)
[place] OFFICE PROGRESS NOTE  Patient Care Team: Shon Baton, MD as PCP - General (Internal Medicine) Heath Lark, MD as Consulting Physician (Hematology and Oncology)  ASSESSMENT & PLAN:  Endometrial cancer [facility]) Her last PET scan showed excellent response to treatment She tolerated treatment well without major side effects The plan will be to continue treatment indefinitely Her next imaging study would be in September  Essential hypertension Her blood pressure control at home is satisfactory Urine protein is positive She will continue current blood pressure prescription for now She is reminded to call me if her blood pressure control at home is not good   Acquired hypothyroidism She has fatigue TSH is elevated secondary to acquired hypothyroidism due to side effects of Keytruda She was started on thyroid replacement therapy in March We will monitor her thyroid function carefully and adjust the dose accordingly  Elevated serum creatinine She has mild intermittent elevated serum creatinine We will monitor carefully I will continue to manage her blood pressure aggressively   No orders of the defined types were placed in this encounter.   All questions were answered. The patient knows to call the clinic with any problems, questions or concerns. The total time spent in the appointment was 20 minutes encounter with patients including review of chart and various tests results, discussions about plan of care and coordination of care plan   Heath Lark, MD 08/16/2019 12:20 PM  INTERVAL HISTORY: Please see below for problem oriented charting. She returns for treatment and follow-up According to the patient, her blood pressure control at home is within normal limits She has no recent side effects from treatment No infusion reaction Appetite is fair She denies recent back pain  SUMMARY OF ONCOLOGIC HISTORY: Oncology History Overview Note  Biopsy of recurrence  06-2014 ER negative. PDL1 testing low at 2% on the uterine cancer She progressed on carboplatin and Paclitaxel for uterine cancer HER 2 negative on pathology [id number] Negative genetic testing  On her lung cancer sample, [employer] testing revealed 10% PD-1 positive EGFR mutation was detected, exon 19 deletion   Endometrial cancer ([place])  12/01/2007 Initial Diagnosis   She has recurrent papillary serous endometrial carcinoma. Briefly she was diagnosed in 2009 with a FIGO IB pappilary serous carcinoma treated with hysterectomy followed by adjuvant chemo and vaginal brachytherapy. She then had a recurrence diagnosed in late 2012 that was deemed not to be surgically resectable, and was treated with 6 cycles of carbo/taxol followed by 5040 cGy of EBRT to the pelvic mass. She was then followed with serial imaging and was noted in June of this year to have slight increase in the size of the mass, and again in September there was small enlargement of the mass. She had a re-staging PET scan on 03/10/13 that showed FDG activity in the pelvic mass, with no other areas of disease   12/01/2007 Imaging   CT scan of chest, abdomen and pelvis: 1.  Mass like area of decreased attenuation in the central uterus, consistent with the known endometrial carcinoma.  Deep myometrial invasion is suspected.  Pelvic MRI without and with contrast could be performed for further staging workup if clinically warranted. 2.  No evidence of extrauterine extension or lymphadenopathy. 3.  Sigmoid diverticulosis incidentally noted.   12/15/2007 Surgery   --12/2007 laparoscopic hysterectomy and PLND --05/2008 complted 6 cycles adjuvant carbo/taxol followed by vaginal cuff brachy --03/2011 exploratory lararoscopy, ureterolysis --4/13 completed 6 cycels carbotaxol --8/13 completed EBRT to the left pelvic  sidewall    06/10/2008 Imaging   CT scan abdomen and pelvis 1.  Nonspecific mildly prominent inguinal lymph nodes, right greater  than left. 2.  Interval hysterectomy without evidence of recurrent pelvic mass or fluid collection.   03/22/2011 Imaging   Ct scan abdomen and pelvis 1.  Local uterine cancer recurrence with two large nodes along the left pelvic sidewall. 2.  No evidence of bowel obstruction, urinary obstruction, or more distant metastasis.   03/31/2011 Relapse/Recurrence   chemotherapy and external beam radiation   05/16/2011 - 09/17/2011 Chemotherapy   She had 6 cycles of carboplatin and Taxol    07/17/2011 Imaging   Ct scan abdomen and pelvis 1.  Interval improvement in the previously demonstrated left pelvic local recurrence of tumor. 2.  No disease progression or complication identified. 3.  Mild bladder wall thickening on the right, nonspecific and possibly related to incomplete distension and/or radiation therapy. 4.  Cholelithiasis   10/11/2011 Imaging   CT scan abdomen and pelvis 1.  Interval enlargement of the left pelvic sidewall masses. 2.  No new nodular disease in the pelvis or lymphadenopathy. 3.  No evidence  of distant metastasis. 4.  Mild hydronephrosis on the right is similar to prior. 5.  Focal thickening of the right aspect the bladder is stable compared to prior.    10/28/2011 - 12/05/2011 Radiation Therapy   radiation for pelvic sidewall recurrence   10/28/2011 - 12/05/2011 Radiation Therapy   10/28/11-12/05/11: Radiotherapy to the left pelvic sidewall   03/18/2012 Imaging   CT abdomen 1.  Today's study demonstrates a positive response to therapy with decreased size of left pelvic sidewall nodal masses, as detailed above.  No new soft tissue masses or new lymphadenopathy is identified within the abdomen or pelvis. Continue attention on follow-up studies is recommended. 2.  Cholelithiasis without findings to suggest acute cholecystitis. 3.  Colonic diverticulosis without findings to suggest acute diverticulitis at this time. 4.  Mild asymmetric urinary bladder wall thickening is  unchanged compared to prior examinations and is nonspecific.  No definite bladder wall mass is identified at this time. 5.  Additional findings, similar to prior examinations, as above   06/22/2012 Imaging   CT abdomen 1.  Further decreased size of the left pelvic peripherally enhancing lesion. 2.  No new sites of new or progressive disease. 3. Esophageal air fluid level suggests dysmotility or gastroesophageal reflux. 4.  Cholelithiasis   11/02/2012 Imaging   CT abdomen 1.  The left pelvic side wall lesion demonstrates mild increase in size from previous exam. 2.  No new sites of new or progressive disease. 3.  Cholelithiasis.    01/28/2013 Imaging   CT abdomen 1. Interval small increase in volume of enhancing mass adjacent to the left pelvic sidewall. 2. No evidence of abdominal or pelvic lymphadenopathy   04/02/2013 Surgery   pelvic mass resection with IORT   04/02/2013 - 04/02/2013 Radiation Therapy   04/02/13: Intraoperative radiotherapy to the left pelvis   05/10/2013 PET scan   1. Hypermetabolic mass in the deep left pelvis consistent with metastasis. 2. No additional evidence of local metastasis. No evidence of distant metastasis. 3. Small right lower lobe pulmonary nodule is not hypermetabolic.   07/27/2013 Imaging   No evidence of recurrent or metastatic disease. Apparent prior resection of the deep left pelvic metastasis.   02/08/2014 Imaging   No CT findings for recurrent or metastatic disease involving the abdomen/ pelvis   06/13/2014 Relapse/Recurrence   + recurrence paraspinous  muscle   06/21/2014 Procedure   There is a soft tissue lesion along the left side of L3-L4. This lesion roughly measures up to 2.2 cm. Needle was positioned along the posterior aspect of the lesion. Small amount of air in the paraspinal tissues following needle removal   06/21/2014 Pathology Results   Bone, biopsy, left lumbar paraspinal - METASTATIC POORLY DIFFERENTIATED CARCINOMA,  CONSISTENT WITH HIGH GRADE SEROUS CARCINOMA SEE COMMENT. Microscopic Comment The carcinoma demonstrates the following immunophenotype: Cytokeratin 7 - patchy moderate to strong expression Estrogen receptor - negative expression P53 - strong diffuse expression TTF-1 - negative expression WT-1 - negative expression CD56 - focal moderate strong expression Synaptophysin - negative expression GCDFP - negative expression The history of primary endometrial papillary serous carcinoma and primary mammary carcinoma is noted. In the current case, the overall morphologic and immunophenotype are that of poorly differentiated carcinoma, consistent with high grade serous carcinoma.    07/11/2014 - 08/12/2014 Radiation Therapy   07/11/14-08/12/14: 55 Gy in 25 fractions to the Lumbar spine   08/15/2014 Imaging   CT scan of chest, abdomen and pelvis 1. Since the biopsy study of 06/21/2014, similar to slight decrease in size of a left paraspinous lesion at L3-4. 2. No new sites of metastatic disease identified. 3. 8 mm ground-glass nodule in the right lower lobe is grossly similar to 03/10/2013, suggesting a benign etiology. 4. Cholelithiasis. 5. Apparent sigmoid colonic wall thickening which could be due to underdistention. Colitis felt less likely. 6.  Atherosclerosis, including within the coronary arteries. 7. Pelvic floor laxity.   10/07/2014 Imaging   CT scan of chest, abdomen and pelvis: Stable to slight interval decrease in size of left paraspinous lesion at L3-4. No new sites of metastatic disease. Unchanged ground-glass nodule within the right lower lobe, potentially benign in etiology. Recommend attention on followup. Cholelithiasis. Sigmoid colonic diverticulosis. No CT evidence to suggest acute diverticulitis.   01/19/2015 Imaging   Ct scan of abdomen and pelvis 1. Decrease in size of left paraspinous metastasis. 2. No new sites of disease. 3. Stable ground-glass attenuating nodule within the  superior segment of right lower lobe. 4. Gallstones   05/02/2015 Imaging   Ct abdomen and pelvis: Slight interval increase in size of left lumbar paraspinal soft tissue metastasis. No new sites of metastatic disease identified within the abdomen or pelvis.    05/11/2015 PET scan   1. The small soft tissue mass just lateral to the left L3 for neural foramen is hypermetabolic, favoring malignancy. 2. There is also a small focus of hypermetabolic activity between the left L1 and L2 transverse processes, but without a CT correlate. 3. The 13 mm sub solid nodule in the superior segment right lower lobe is stable from recent exams but has slowly increased in size over the last 7 years. Although not hypermetabolic, the appearance is concerning for the possibility of a low grade adenocarcinoma. 4. Other imaging findings of potential clinical significance: Chronic bilateral maxillary sinusitis. Coronary, aortic arch, and branch vessel atherosclerotic vascular disease. Aortoiliac atherosclerotic vascular disease. Cholelithiasis. Sigmoid colon diverticulosis. Lumbar scoliosis.   07/11/2015 Imaging   Ct scan of chest, abdomen and pelvis: 1. 13 mm mixed solid and sub solid nodule in the posterior right lower lobe was present in 2009 and has clearly progressed in the interval since that study. Imaging features remain highly concerning for low-grade or well differentiated adenocarcinoma. 2. Slight increase size of in the hypermetabolic, rim enhancing nodule identified adjacent to the left L3-4 neural foramen.  Metastatic disease remains a concern. 3. No evidence for discrete soft tissue lesion between the left L1 and 2 transverse processes, the site of focal FDG uptake on the recent PET-CT. 4. Cholelithiasis. 5. Abdominal aortic atherosclerosis   08/03/2015 - 01/16/2016 Chemotherapy   She received carboplatin and Taxol   11/06/2015 PET scan   1. Hypermetabolic left M5-7 paraspinous metastasis (biopsy-proven) is  stable in size and slightly decreased in metabolism. 2. Previously described small focus of hypermetabolism between the left L2 and L3 spinous processes has resolved. 3. New mild linear hypermetabolism to the left of the T11-12 spinous processes without discrete mass on the CT images, favor benign activity related activity, recommend attention on follow-up PET-CT.  4. No definite new sites of hypermetabolic metastatic disease. No recurrent hypermetabolic metastatic disease in the pelvis. 5. Interval stability of subsolid 1.3 cm superior segment right lower lobe pulmonary nodule without associated significant metabolism, which has grown compared to the 2009 chest CT study, and remain suspicious for low grade adenocarcinoma. 6. Additional findings include aortic atherosclerosis, coronary atherosclerosis, mild multinodular goiter with no hypermetabolic thyroid nodules, cholelithiasis and moderate sigmoid diverticulosis.   02/12/2016 PET scan   1. Interval increase in size and metabolic activity of the LEFT paraspinal soft tissue metastasis. 2. New activity within the musculature of the LEFT chest wall. Given the unusual location of the paraspinal metastasis cannot exclude a second soft tissue metastasis to the musculature however favor benign physiologic activity. 3. Stable RIGHT lower lobe pulmonary nodule. 4. No evidence of local recurrence.     04/08/2016 Imaging   Ct scan of chest, abdomen and pelvis: 1. Similar size of a  left paravertebral abdominal lesion. 2. No new or progressive metastatic disease. 3. No correlate for the muscular activity about the lateral left chest wall. 4.  Coronary artery atherosclerosis. Aortic atherosclerosis. 5. Cholelithiasis. 6. Similar right lower lobe pulmonary nodule.   06/03/2016 Imaging   CT abdomen and pelvis 1. Similar to mild enlargement of a paravertebral soft tissue lesion at the L3-4 level. 2. No new sites of disease identified. 3.  Cholelithiasis. 4. Hysterectomy. 5.  Aortic atherosclerosis.    09/02/2016 Imaging   CT abdomen and pelvis 1. Continue mild interval increase in size of LEFT paraspinal mass (1-2 mm). 2. No evidence of new metastatic disease in the abdomen pelvis. 3. Post hysterectomy anatomy   09/26/2016 Imaging   MR lumbar spine 1. Left paraspinal metastasis with epicenter adjacent to the left L3 neural foramen does extend into the foramen to the level of the left lateral epidural space (series 9, image 31), but also tracks cephalad and caudal along the L2-L4 lumbar plexus (series 10, image 15). No extension into the left L2 or L4 neural foramina. And no more distal extension of tumor. 2. Abnormal signal in the medial left psoas and ventral left erector spinae muscles at L2 and L3 is probably denervation related. 3. No bone invasion or osseous metastatic disease. Suspect previous radiation of the L2 through L5 spinal levels. 4. No dural or intradural metastatic disease.    10/22/2016 Procedure   She underwent stereotactic Radiosurgery   10/30/2016 Genetic Testing   Patient has genetic testing done for Inheritable genetic mutation panel. Results revealed patient has no actionable mutation.   01/21/2017 Imaging   1. Reduced size and conspicuity of the left paraspinal mass at the L3-4 level. The mass still present but has enhancement similar to that of adjacent psoas musculature. 2. No new metastatic disease is identified.  3. Other imaging findings of potential clinical significance: Cholelithiasis. Aortic Atherosclerosis (ICD10-I70.0). Sigmoid diverticulosis. Lumbar scoliosis.   05/09/2017 Imaging   Ct scan of abdomen and pelvis 1. Paraspinal mass adjacent to the left side of L3-L4 is less distinct than prior examinations, and is therefore difficult to discretely measure, however, the overall appearance suggests continued positive response to therapy. 2. No new signs of metastatic disease elsewhere in  the abdomen or pelvis. 3. Aortic atherosclerosis, in addition to at least right coronary artery disease. Assessment for potential risk factor modification, dietary therapy or pharmacologic therapy may be warranted, if clinically indicated. 4. Colonic diverticulosis without evidence of acute diverticulitis at this time. 5. Additional incidental findings, as above. Aortic Atherosclerosis (ICD10-I70.0).   08/14/2017 Imaging   1. Treated left paraspinal mass centered at L3-4. Mass size is stable but there has been some evolution of tumor characteristics with less central fluid seen today. Recommend continued surveillance. 2. No evidence of untreated metastasis. 3. Degenerative changes and related impingement are described above.   10/28/2017 Imaging   Stable small left paraspinal soft tissue mass. No new or progressive disease within the abdomen or pelvis.  Cholelithiasis.  No radiographic evidence of cholecystitis.  Colonic diverticulosis, without radiographic evidence of diverticulitis.   04/30/2018 Imaging   Stable post treatment change of the partially necrotic LEFT paravertebral mass at L3-L4. 18 x 21 mm cross-section. Mass effect on the LEFT L3 nerve root redemonstrated. Enhancing and atrophic LEFT psoas is inseparable.   10/15/2018 Imaging   1. Stable post treatment size and appearance of partially necrotic left paravertebral mass at L3-4, measuring 19 x 25 mm on current exam (unchanged when measured at similar level on previous study). Mass effect on the adjacent left L3 nerve root is unchanged. Adjacent enhancing and atrophic left psoas muscle. 2. Left convex scoliosis with associated multilevel degenerative spondylolysis and facet arthrosis, unchanged   10/29/2018 Imaging   Stable small left L3 paraspinal soft tissue mass. No evidence of new or progressive metastatic disease, or other acute findings.   Cholelithiasis.  No radiographic evidence of cholecystitis.   Colonic  diverticulosis, without radiographic evidence of diverticulitis.   11/11/2018 PET scan   1. Low level metabolism (max SUV 2.0) associated with the irregular subsolid superior segment right lower lobe 2.1 cm pulmonary nodule. As better depicted on the recent diagnostic chest CT study, this nodule crosses the major fissure into the right upper lobe and has increased in size and density on multiple imaging studies back to 2009. Findings are most suggestive of primary bronchogenic adenocarcinoma. 2. No hypermetabolic thoracic adenopathy. 3. Persistent hypermetabolism (max SUV 8.0) associated with the known left L3-4 paraspinous metastasis, mildly decreased in metabolism since 02/12/2016 PET-CT. 4. No additional sites of hypermetabolic metastatic disease in the abdomen or pelvis. No metabolic evidence of peritoneal recurrence. No ascites. 5. Chronic findings include: Aortic Atherosclerosis (ICD10-I70.0). Coronary atherosclerosis. Chronic bilateral maxillary sinusitis. Cholelithiasis. Moderate sigmoid diverticulosis.   01/07/2019 - 04/04/2019 Chemotherapy   The patient had carboplatin and Alimta for chemotherapy treatment.     05/03/2019 -  Chemotherapy   The patient had Pembrolizumab and Lenvima for chemotherapy treatment.     07/23/2019 PET scan   1. 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 No new sites of hypermetabolic metastatic disease on today's study. 2. Stable appearance of surgical changes in the right lung without features to suggest local recurrence or metastatic lung cancer. 3. Cholelithiasis. 4.  Aortic Atherosclerois (ICD10-170.0)  Metastatic cancer to bone ([place])  06/27/2014 Initial Diagnosis   Metastatic cancer to bone   Primary lung cancer, right ([place])  11/04/2018 Initial Diagnosis   Primary lung cancer, right ([place])   11/11/2018 PET scan   1. Low level metabolism (max SUV 2.0) associated with the irregular subsolid superior  segment right lower lobe 2.1 cm pulmonary nodule. As better depicted on the recent diagnostic chest CT study, this nodule crosses the major fissure into the right upper lobe and has increased in size and density on multiple imaging studies back to 2009. Findings are most suggestive of primary bronchogenic adenocarcinoma. 2. No hypermetabolic thoracic adenopathy. 3. Persistent hypermetabolism (max SUV 8.0) associated with the known left L3-4 paraspinous metastasis, mildly decreased in metabolism since 02/12/2016 PET-CT. 4. No additional sites of hypermetabolic metastatic disease in the abdomen or pelvis. No metabolic evidence of peritoneal recurrence. No ascites. 5. Chronic findings include: Aortic Atherosclerosis (ICD10-I70.0). Coronary atherosclerosis. Chronic bilateral maxillary sinusitis. Cholelithiasis. Moderate sigmoid diverticulosis.   12/07/2018 Pathology Results   1. Lung, resection (segmental or lobe), Right Lobe Superior with endblock wedge of right upper lobe - INVASIVE ADENOCARCINOMA, MODERATELY DIFFERENTIATED, SPANNING 1.6 CM. - THE SURGICAL RESECTION MARGINS ARE NEGATIVE FOR CARCINOMA. - SEE ONCOLOGY TABLE BELOW. 2. Lymph node, biopsy, Level 9 #1 - THERE IS NO EVIDENCE OF CARCINOMA IN 1 OF 1 LYMPH NODE (0/1). 3. Lymph node, biopsy, Level 9 #2 - THERE IS NO EVIDENCE OF CARCINOMA IN 1 OF 1 LYMPH NODE (0/1). 4. Lymph node, biopsy, Level 7 #1 - METASTATIC CARCINOMA IN 1 OF 1 LYMPH NODE (1/1). 5. Lymph node, biopsy, Level 7 #2 - THERE IS NO EVIDENCE OF CARCINOMA IN 1 OF 1 LYMPH NODE (0/1). 6. Lymph node, biopsy, Level 12 #1 - THERE IS NO EVIDENCE OF CARCINOMA IN 1 OF 1 LYMPH NODE (0/1). 7. Lymph node, biopsy, Level 12 #2 - THERE IS NO EVIDENCE OF CARCINOMA IN 1 OF 1 LYMPH NODE (0/1). 8. Lymph node, biopsy, Level 10 #1 - THERE IS NO EVIDENCE OF CARCINOMA IN 1 OF 1 LYMPH NODE (0/1). 9. Lymph node, biopsy, Level 4R #1 - THERE IS NO EVIDENCE OF CARCINOMA IN 1 OF 1 LYMPH NODE  (0/1). 10. Lymph node, biopsy, Level 4R #2 - THERE IS NO EVIDENCE OF CARCINOMA IN 1 OF 1 LYMPH NODE (0/1). 11. Lymph node, biopsy, Level 2R #1 - THERE IS NO EVIDENCE OF CARCINOMA IN 1 OF 1 LYMPH NODE (0/1). 12. Lung, resection (segmental or lobe), Right Lobe Superior - BENIGN LUNG PARENCHYMA. - THERE IS NO EVIDENCE OF MALIGNANCY. Procedure: Segmentectomy. Specimen Laterality: Right. Tumor Site: Right superior lobe. Tumor Size: 1.6 cm (gross measurement). Tumor Focality: Unifocal. Histologic Type: Adenocarcinoma, moderately differentiated. Visceral Pleura Invasion: Not identified. Lymphovascular Invasion: Not identified. Direct Invasion of Adjacent Structures: Not identified. Margins: Negative for carcinoma. Treatment Effect: N/A Regional Lymph Nodes: Number of Lymph Nodes Involved: 1 (level 7) Number of Lymph Nodes Examined: 10 Pathologic Stage Classification (pTNM, AJCC 8th Edition): pT1b, pN2 Ancillary Studies: The tumor cells are positive for Napsin-A, TTF-1, and p53. They are essentially negative for estrogen receptor, PAX-8 and progesterone receptor. Additional studies can be performed upon clinician request. Representative Tumor Block: 1D-1E. (JBK:gt, 12/09/18)   12/07/2018 Surgery   PREOPERATIVE DIAGNOSIS:  Right lung nodule.   POSTOPERATIVE DIAGNOSIS:  Non-small cell carcinoma- suspected primary lung carcinoma, clinical stage T2N01b.   PROCEDURE:   Right video-assisted thoracoscopy, Right lower lobe superior segmentectomy with en bloc wedge resection of right upper lobe,  Lymph node dissection, Intercostal nerve block levels 3-9.   SURGEON:  Modesto Charon, MD   FINDINGS:  Mass palpable in posterior aspect of superior segment of right lower lobe, likely involvement across fissure into the upper lobe.  Frozen section revealed non-small cell carcinoma. upper and lower lobe margins clear.   12/23/2018 Cancer Staging   Staging form: Lung, AJCC 8th Edition - Pathologic:  Stage IIIA (pT1b, pN2, cM0) - Signed by Heath Lark, MD on 12/23/2018   01/05/2019 Imaging   MRI brain 1. No metastatic disease or acute intracranial abnormality identified. 2. Small chronic parafalcine meningioma size is stable since that described in 2006 (12 x 15 mm). 3. Advanced signal changes in the cerebral white matter and to a lesser extent pons, nonspecific but most commonly due to chronic small vessel disease. 4. Partially visible degenerative cervical spinal stenosis.     01/07/2019 -  Chemotherapy   The patient had carboplatin and Alimta for chemotherapy treatment.     04/14/2019 PET scan   1. 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 As shown on recent MRI of 04/01/2019, there is a new component of invasion into the left lower L3 vertebral body. This new component has a maximum SUV of 10.0, compatible with active malignancy. 2. Interval wedge resection of portions of the right upper lobe and right lower lobe at the site of the prior nodule. There is only minimal low-grade activity along the wedge resection clips, maximum SUV of 1.5. Surveillance is likely warranted. No new nodule identified. 3. Other imaging findings of potential clinical significance: Aortic Atherosclerosis (ICD10-I70.0). Coronary atherosclerosis. Thoracolumbar scoliosis. Cholelithiasis. Sigmoid colon diverticulosis.     05/03/2019 -  Chemotherapy   The patient had Pembrolizumab and Lenvima for chemotherapy treatment.     05/22/2019 Imaging   Ct head Atrophy, chronic microvascular disease.   No acute intracranial abnormality.       REVIEW OF SYSTEMS:   Constitutional: Denies fevers, chills or abnormal weight loss Eyes: Denies blurriness of vision Ears, nose, mouth, throat, and face: Denies mucositis or sore throat Respiratory: Denies cough, dyspnea or wheezes Cardiovascular: Denies palpitation, chest discomfort or lower extremity swelling Gastrointestinal:  Denies nausea,  heartburn or change in bowel habits Skin: Denies abnormal skin rashes Lymphatics: Denies new lymphadenopathy or easy bruising Neurological:Denies numbness, tingling or new weaknesses Behavioral/Psych: Mood is stable, no new changes  All other systems were reviewed with the patient and are negative.  I have reviewed the past medical history, past surgical history, social history and family history with the patient and they are unchanged from previous note.  ALLERGIES:  is allergic to ace inhibitors; dilaudid [hydromorphone hcl]; and codeine.  MEDICATIONS:  Current Outpatient Medications  Medication Sig Dispense Refill  . amLODipine (NORVASC) 5 MG tablet Take 5 mg by mouth daily.    Marland Kitchen aspirin EC 81 MG tablet Take 81 mg by mouth daily.    Marland Kitchen atenolol (TENORMIN) 25 MG tablet Take 25 mg by mouth 2 (two) times daily.     . folic acid (FOLVITE) 1 MG tablet Take 1 mg by mouth once a week.    Marland Kitchen lenvatinib 10 mg daily dose (LENVIMA, 10 MG DAILY DOSE,) capsule Take 1 capsule (10 mg total) by mouth daily. 30 capsule 11  . levothyroxine (SYNTHROID) 25 MCG tablet Take 1 tablet (25 mcg total) by mouth daily before breakfast. 30 tablet 11  . lidocaine-prilocaine (EMLA) cream Apply to [facility] cath 1-2 hours prior to access as directed  30 g 1  . LORazepam (ATIVAN) 0.5 MG tablet 1 tablet po 30 minutes prior to radiation or MRI 30 tablet 0  . Multiple Vitamin (MULTIVITAMIN WITH MINERALS) TABS tablet Take 1 tablet by mouth daily.    . ondansetron (ZOFRAN) 8 MG tablet PLEASE SEE ATTACHED FOR DETAILED DIRECTIONS    . simvastatin (ZOCOR) 20 MG tablet Take 20 mg by mouth every evening.      No current facility-administered medications for this visit.   Facility-Administered Medications Ordered in Other Visits  Medication Dose Route Frequency Provider Last Rate Last Admin  . heparin lock flush 100 unit/mL  500 Units Intracatheter Once Alvy Bimler, Bon Dowis, MD      . heparin lock flush 100 unit/mL  500 Units Intracatheter  Once PRN Alvy Bimler, Kiwan Gadsden, MD      . pembrolizumab (KEYTRUDA) 200 mg in sodium chloride 0.9 % 50 mL chemo infusion  200 mg Intravenous Once Jedadiah Abdallah, MD      . sodium chloride flush (NS) 0.9 % injection 10 mL  10 mL Intracatheter Once Alvy Bimler, Atom Solivan, MD      . sodium chloride flush (NS) 0.9 % injection 10 mL  10 mL Intracatheter PRN Alvy Bimler, Markanthony Gedney, MD        PHYSICAL EXAMINATION: ECOG PERFORMANCE STATUS: 1 - Symptomatic but completely ambulatory  Vitals:   08/16/19 1130  BP: (!) 159/84  Pulse: 70  Resp: 18  Temp: 98 F (36.7 C)  SpO2: 98%   Filed Weights   08/16/19 1130  Weight: 105 lb 4.8 oz (47.8 kg)    GENERAL:alert, no distress and comfortable SKIN: skin color, texture, turgor are normal, no rashes or significant lesions EYES: normal, Conjunctiva are pink and non-injected, sclera clear OROPHARYNX:no exudate, no erythema and lips, buccal mucosa, and tongue normal  NECK: supple, thyroid normal size, non-tender, without nodularity LYMPH:  no palpable lymphadenopathy in the cervical, axillary or inguinal LUNGS: clear to auscultation and percussion with normal breathing effort HEART: regular rate & rhythm and no murmurs and no lower extremity edema ABDOMEN:abdomen soft, non-tender and normal bowel sounds Musculoskeletal:no cyanosis of digits and no clubbing  NEURO: alert & oriented x 3 with fluent speech, no focal motor/sensory deficits  LABORATORY DATA:  I have reviewed the data as listed    Component Value Date/Time   NA 141 08/16/2019 1110   NA 140 05/09/2017 0844   K 4.0 08/16/2019 1110   K 4.1 05/09/2017 0844   CL 104 08/16/2019 1110   CO2 24 08/16/2019 1110   CO2 26 05/09/2017 0844   GLUCOSE 118 (H) 08/16/2019 1110   GLUCOSE 82 05/09/2017 0844   BUN 20 08/16/2019 1110   BUN 20.7 05/09/2017 0844   CREATININE 1.16 (H) 08/16/2019 1110   CREATININE 0.7 05/09/2017 0844   CALCIUM 9.6 08/16/2019 1110   CALCIUM 9.3 05/09/2017 0844   PROT 7.0 08/16/2019 1110   PROT 6.8  05/09/2017 0844   ALBUMIN 3.7 08/16/2019 1110   ALBUMIN 3.6 05/09/2017 0844   AST 35 08/16/2019 1110   AST 27 05/09/2017 0844   ALT 29 08/16/2019 1110   ALT 17 05/09/2017 0844   ALKPHOS 55 08/16/2019 1110   ALKPHOS 43 05/09/2017 0844   BILITOT 1.5 (H) 08/16/2019 1110   BILITOT 0.81 05/09/2017 0844   GFRNONAA 45 (L) 08/16/2019 1110   GFRAA 52 (L) 08/16/2019 1110    No results found for: SPEP, UPEP  Lab Results  Component Value Date   WBC 5.8 08/16/2019   NEUTROABS 3.0  08/16/2019   HGB 13.0 08/16/2019   HCT 38.0 08/16/2019   MCV 97.7 08/16/2019   PLT 137 (L) 08/16/2019      Chemistry      Component Value Date/Time   NA 141 08/16/2019 1110   NA 140 05/09/2017 0844   K 4.0 08/16/2019 1110   K 4.1 05/09/2017 0844   CL 104 08/16/2019 1110   CO2 24 08/16/2019 1110   CO2 26 05/09/2017 0844   BUN 20 08/16/2019 1110   BUN 20.7 05/09/2017 0844   CREATININE 1.16 (H) 08/16/2019 1110   CREATININE 0.7 05/09/2017 0844      Component Value Date/Time   CALCIUM 9.6 08/16/2019 1110   CALCIUM 9.3 05/09/2017 0844   ALKPHOS 55 08/16/2019 1110   ALKPHOS 43 05/09/2017 0844   AST 35 08/16/2019 1110   AST 27 05/09/2017 0844   ALT 29 08/16/2019 1110   ALT 17 05/09/2017 0844   BILITOT 1.5 (H) 08/16/2019 1110   BILITOT 0.81 05/09/2017 0844       RADIOGRAPHIC STUDIES: I have personally reviewed the radiological images as listed and agreed with the findings in the report. NM PET Image Restag (PS) Skull Base To Thigh  Result Date: 07/23/2019 CLINICAL DATA:  Subsequent treatment strategy for endometrial cancer. History of non-small-cell lung cancer. EXAM: NUCLEAR MEDICINE PET SKULL BASE TO THIGH TECHNIQUE: 5.0 mCi F-18 FDG was injected intravenously. Full-ring PET imaging was performed from the skull base to thigh after the radiotracer. CT data was obtained and used for attenuation correction and anatomic localization. Fasting blood glucose: 84 mg/dl COMPARISON:  04/14/2019 FINDINGS:  Mediastinal blood pool activity: SUV max 1.9 Liver activity: SUV max NA NECK: No hypermetabolic lymph nodes in the neck. Incidental CT findings: none CHEST: No hypermetabolic mediastinal or hilar nodes. No suspicious pulmonary nodules on the CT scan. Muscular uptake in the proximal right arm may be related to movement after FDG injection. Incidental CT findings: Left Port-A-Cath tip is in the proximal to mid SVC. Atherosclerotic calcification is noted in the wall of the thoracic aorta. Surgical clips noted right axilla stable volume loss right hemithorax surgical changes parahilar right lung similar to prior. No pleural effusion. ABDOMEN/PELVIS: No abnormal hypermetabolic activity within the liver, pancreas, adrenal glands, or spleen. No hypermetabolic lymph nodes in the abdomen or pelvis. Incidental CT findings: Calcified gallstones again noted. There is abdominal aortic atherosclerosis without aneurysm. Diverticular changes noted in the left colon without features of diverticulitis. SKELETON: The Incidental CT findings: The seen previously on the left at L3-4 has decreased in the interval with SUV max = 4.8 today compared to 10.0 previously. Left paravertebral component measured with SUV max = 7.9 previously is now 2.7. No new sites of skeletal hypermetabolism on today's study. IMPRESSION: 1. 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 No new sites of hypermetabolic metastatic disease on today's study. 2. Stable appearance of surgical changes in the right lung without features to suggest local recurrence or metastatic lung cancer. 3. Cholelithiasis. 4.  Aortic Atherosclerois (ICD10-170.0) Electronically Signed   By: Misty Stanley M.D.   On: 07/23/2019 13:51

## 2019-08-31 ENCOUNTER — Telehealth: Payer: Self-pay | Admitting: Radiation Therapy

## 2019-08-31 ENCOUNTER — Other Ambulatory Visit: Payer: Self-pay | Admitting: Radiation Therapy

## 2019-08-31 DIAGNOSIS — C7951 Secondary malignant neoplasm of bone: Secondary | ICD-10-CM

## 2019-08-31 NOTE — Telephone Encounter (Signed)
Spoke with [name] about her upcoming Lumbar MRI and virtual follow-up with [name] to review the results. She has the July scan appointment documented and knows that [name] will call her after the results have been reviewed in our [facility] and spine [facility].      Mont Dutton R.T.(R)(T) [facility]   Radiation Oncology  Radiation Special Procedures Navigator Office: [phone]   Pager: [phone]  [place] Website: [email or web address]

## 2019-09-06 ENCOUNTER — Other Ambulatory Visit: Payer: Self-pay

## 2019-09-06 ENCOUNTER — Inpatient Hospital Stay: Payer: Medicare Other

## 2019-09-06 ENCOUNTER — Other Ambulatory Visit: Payer: Self-pay | Admitting: Hematology and Oncology

## 2019-09-06 ENCOUNTER — Telehealth: Payer: Self-pay | Admitting: Hematology and Oncology

## 2019-09-06 ENCOUNTER — Inpatient Hospital Stay (HOSPITAL_BASED_OUTPATIENT_CLINIC_OR_DEPARTMENT_OTHER): Payer: Medicare Other | Admitting: Hematology and Oncology

## 2019-09-06 ENCOUNTER — Encounter: Payer: Self-pay | Admitting: Hematology and Oncology

## 2019-09-06 DIAGNOSIS — I10 Essential (primary) hypertension: Secondary | ICD-10-CM

## 2019-09-06 DIAGNOSIS — Z7189 Other specified counseling: Secondary | ICD-10-CM

## 2019-09-06 DIAGNOSIS — C7951 Secondary malignant neoplasm of bone: Secondary | ICD-10-CM

## 2019-09-06 DIAGNOSIS — C541 Malignant neoplasm of endometrium: Secondary | ICD-10-CM

## 2019-09-06 DIAGNOSIS — Z95828 Presence of other vascular implants and grafts: Secondary | ICD-10-CM

## 2019-09-06 DIAGNOSIS — E039 Hypothyroidism, unspecified: Secondary | ICD-10-CM

## 2019-09-06 DIAGNOSIS — Z5112 Encounter for antineoplastic immunotherapy: Secondary | ICD-10-CM | POA: Diagnosis not present

## 2019-09-06 DIAGNOSIS — C3411 Malignant neoplasm of upper lobe, right bronchus or lung: Secondary | ICD-10-CM | POA: Diagnosis not present

## 2019-09-06 DIAGNOSIS — Z853 Personal history of malignant neoplasm of breast: Secondary | ICD-10-CM

## 2019-09-06 DIAGNOSIS — D701 Agranulocytosis secondary to cancer chemotherapy: Secondary | ICD-10-CM

## 2019-09-06 DIAGNOSIS — C549 Malignant neoplasm of corpus uteri, unspecified: Secondary | ICD-10-CM

## 2019-09-06 LAB — CBC WITH DIFFERENTIAL (CANCER CENTER ONLY)
Abs Immature Granulocytes: 0.03 10*3/uL (ref 0.00–0.07)
Basophils Absolute: 0 10*3/uL (ref 0.0–0.1)
Basophils Relative: 0 %
Eosinophils Absolute: 0.4 10*3/uL (ref 0.0–0.5)
Eosinophils Relative: 6 %
HCT: 34.4 % — ABNORMAL LOW (ref 36.0–46.0)
Hemoglobin: 11.3 g/dL — ABNORMAL LOW (ref 12.0–15.0)
Immature Granulocytes: 0 %
Lymphocytes Relative: 15 %
Lymphs Abs: 1.1 10*3/uL (ref 0.7–4.0)
MCH: 32.8 pg (ref 26.0–34.0)
MCHC: 32.8 g/dL (ref 30.0–36.0)
MCV: 100 fL (ref 80.0–100.0)
Monocytes Absolute: 1 10*3/uL (ref 0.1–1.0)
Monocytes Relative: 13 %
Neutro Abs: 4.9 10*3/uL (ref 1.7–7.7)
Neutrophils Relative %: 66 %
Platelet Count: 197 10*3/uL (ref 150–400)
RBC: 3.44 MIL/uL — ABNORMAL LOW (ref 3.87–5.11)
RDW: 14.2 % (ref 11.5–15.5)
WBC Count: 7.4 10*3/uL (ref 4.0–10.5)
nRBC: 0 % (ref 0.0–0.2)

## 2019-09-06 LAB — TSH: TSH: <0.08 u[IU]/mL — ABNORMAL LOW (ref 0.308–3.960)

## 2019-09-06 LAB — CMP (CANCER CENTER ONLY)
ALT: 22 U/L (ref 0–44)
AST: 27 U/L (ref 15–41)
Albumin: 3 g/dL — ABNORMAL LOW (ref 3.5–5.0)
Alkaline Phosphatase: 66 U/L (ref 38–126)
Anion gap: 12 (ref 5–15)
BUN: 26 mg/dL — ABNORMAL HIGH (ref 8–23)
CO2: 20 mmol/L — ABNORMAL LOW (ref 22–32)
Calcium: 9.1 mg/dL (ref 8.9–10.3)
Chloride: 111 mmol/L (ref 98–111)
Creatinine: 1.09 mg/dL — ABNORMAL HIGH (ref 0.44–1.00)
GFR, Est AFR Am: 56 mL/min — ABNORMAL LOW (ref >60)
GFR, Estimated: 48 mL/min — ABNORMAL LOW (ref >60)
Glucose, Bld: 110 mg/dL — ABNORMAL HIGH (ref 70–99)
Potassium: 3.8 mmol/L (ref 3.5–5.1)
Sodium: 143 mmol/L (ref 135–145)
Total Bilirubin: 0.8 mg/dL (ref 0.3–1.2)
Total Protein: 6.5 g/dL (ref 6.5–8.1)

## 2019-09-06 LAB — T4, FREE: Free T4: 1.32 ng/dL — ABNORMAL HIGH (ref 0.61–1.12)

## 2019-09-06 LAB — TOTAL PROTEIN, URINE DIPSTICK: Protein, ur: NEGATIVE mg/dL

## 2019-09-06 MED ORDER — SODIUM CHLORIDE 0.9 % IV SOLN
200.0000 mg | Freq: Once | INTRAVENOUS | Status: AC
  Administered 2019-09-06: 12:00:00 200 mg via INTRAVENOUS
  Filled 2019-09-06: qty 8

## 2019-09-06 MED ORDER — SODIUM CHLORIDE 0.9 % IV SOLN
Freq: Once | INTRAVENOUS | Status: AC
  Filled 2019-09-06: qty 250

## 2019-09-06 MED ORDER — SODIUM CHLORIDE 0.9% FLUSH
10.0000 mL | Freq: Once | INTRAVENOUS | Status: AC
  Administered 2019-09-06: 10 mL
  Filled 2019-09-06: qty 10

## 2019-09-06 MED ORDER — HEPARIN SOD (PORK) LOCK FLUSH 100 UNIT/ML IV SOLN
500.0000 [IU] | Freq: Once | INTRAVENOUS | Status: AC | PRN
  Administered 2019-09-06: 500 [IU]
  Filled 2019-09-06: qty 5

## 2019-09-06 MED ORDER — SODIUM CHLORIDE 0.9% FLUSH
10.0000 mL | INTRAVENOUS | Status: DC | PRN
  Administered 2019-09-06: 10 mL
  Filled 2019-09-06: qty 10

## 2019-09-06 NOTE — Assessment & Plan Note (Signed)
Her last PET scan showed excellent response to treatment She tolerated treatment well without major side effects The plan will be to continue treatment indefinitely Her next imaging study would be in September

## 2019-09-06 NOTE — Progress Notes (Signed)
[place] OFFICE PROGRESS NOTE  Patient Care Team: Shon Baton, MD as PCP - General (Internal Medicine) Heath Lark, MD as Consulting Physician (Hematology and Oncology)  ASSESSMENT & PLAN:  Endometrial cancer [facility]) Her last PET scan showed excellent response to treatment She tolerated treatment well without major side effects The plan will be to continue treatment indefinitely Her next imaging study would be in September  Acquired hypothyroidism She continues to complain of fatigue Her recent TSH was abnormal and she was started on Synthroid I will continue to adjust the dose of Synthroid as needed  Essential hypertension Her blood pressure control is good She will continue current prescription antihypertensives   No orders of the defined types were placed in this encounter.   All questions were answered. The patient knows to call the clinic with any problems, questions or concerns. The total time spent in the appointment was 20 minutes encounter with patients including review of chart and various tests results, discussions about plan of care and coordination of care plan   Heath Lark, MD 09/06/2019 11:17 AM  INTERVAL HISTORY: Please see below for problem oriented charting. She returns for treatment and follow-up She complained of fatigue Appetite is fair She has not lost any weight She denies chest pain She has occasional shortness of breath on exertion No infusion reaction Her blood pressure control at home is satisfactory  SUMMARY OF ONCOLOGIC HISTORY: Oncology History Overview Note  Biopsy of recurrence 06-2014 ER negative. PDL1 testing low at 2% on the uterine cancer She progressed on carboplatin and Paclitaxel for uterine cancer HER 2 negative on pathology [id number] Negative genetic testing  On her lung cancer sample, [employer] testing revealed 10% PD-1 positive EGFR mutation was detected, exon 19 deletion   Endometrial cancer ([place])   12/01/2007 Initial Diagnosis   She has recurrent papillary serous endometrial carcinoma. Briefly she was diagnosed in 2009 with a FIGO IB pappilary serous carcinoma treated with hysterectomy followed by adjuvant chemo and vaginal brachytherapy. She then had a recurrence diagnosed in late 2012 that was deemed not to be surgically resectable, and was treated with 6 cycles of carbo/taxol followed by 5040 cGy of EBRT to the pelvic mass. She was then followed with serial imaging and was noted in June of this year to have slight increase in the size of the mass, and again in September there was small enlargement of the mass. She had a re-staging PET scan on 03/10/13 that showed FDG activity in the pelvic mass, with no other areas of disease   12/01/2007 Imaging   CT scan of chest, abdomen and pelvis: 1.  Mass like area of decreased attenuation in the central uterus, consistent with the known endometrial carcinoma.  Deep myometrial invasion is suspected.  Pelvic MRI without and with contrast could be performed for further staging workup if clinically warranted. 2.  No evidence of extrauterine extension or lymphadenopathy. 3.  Sigmoid diverticulosis incidentally noted.   12/15/2007 Surgery   --12/2007 laparoscopic hysterectomy and PLND --05/2008 complted 6 cycles adjuvant carbo/taxol followed by vaginal cuff brachy --03/2011 exploratory lararoscopy, ureterolysis --4/13 completed 6 cycels carbotaxol --8/13 completed EBRT to the left pelvic sidewall    06/10/2008 Imaging   CT scan abdomen and pelvis 1.  Nonspecific mildly prominent inguinal lymph nodes, right greater than left. 2.  Interval hysterectomy without evidence of recurrent pelvic mass or fluid collection.   03/22/2011 Imaging   Ct scan abdomen and pelvis 1.  Local uterine cancer recurrence with  two large nodes along the left pelvic sidewall. 2.  No evidence of bowel obstruction, urinary obstruction, or more distant metastasis.   03/31/2011  Relapse/Recurrence   chemotherapy and external beam radiation   05/16/2011 - 09/17/2011 Chemotherapy   She had 6 cycles of carboplatin and Taxol    07/17/2011 Imaging   Ct scan abdomen and pelvis 1.  Interval improvement in the previously demonstrated left pelvic local recurrence of tumor. 2.  No disease progression or complication identified. 3.  Mild bladder wall thickening on the right, nonspecific and possibly related to incomplete distension and/or radiation therapy. 4.  Cholelithiasis   10/11/2011 Imaging   CT scan abdomen and pelvis 1.  Interval enlargement of the left pelvic sidewall masses. 2.  No new nodular disease in the pelvis or lymphadenopathy. 3.  No evidence  of distant metastasis. 4.  Mild hydronephrosis on the right is similar to prior. 5.  Focal thickening of the right aspect the bladder is stable compared to prior.    10/28/2011 - 12/05/2011 Radiation Therapy   radiation for pelvic sidewall recurrence   10/28/2011 - 12/05/2011 Radiation Therapy   10/28/11-12/05/11: Radiotherapy to the left pelvic sidewall   03/18/2012 Imaging   CT abdomen 1.  Today's study demonstrates a positive response to therapy with decreased size of left pelvic sidewall nodal masses, as detailed above.  No new soft tissue masses or new lymphadenopathy is identified within the abdomen or pelvis. Continue attention on follow-up studies is recommended. 2.  Cholelithiasis without findings to suggest acute cholecystitis. 3.  Colonic diverticulosis without findings to suggest acute diverticulitis at this time. 4.  Mild asymmetric urinary bladder wall thickening is unchanged compared to prior examinations and is nonspecific.  No definite bladder wall mass is identified at this time. 5.  Additional findings, similar to prior examinations, as above   06/22/2012 Imaging   CT abdomen 1.  Further decreased size of the left pelvic peripherally enhancing lesion. 2.  No new sites of new or progressive  disease. 3. Esophageal air fluid level suggests dysmotility or gastroesophageal reflux. 4.  Cholelithiasis   11/02/2012 Imaging   CT abdomen 1.  The left pelvic side wall lesion demonstrates mild increase in size from previous exam. 2.  No new sites of new or progressive disease. 3.  Cholelithiasis.    01/28/2013 Imaging   CT abdomen 1. Interval small increase in volume of enhancing mass adjacent to the left pelvic sidewall. 2. No evidence of abdominal or pelvic lymphadenopathy   04/02/2013 Surgery   pelvic mass resection with IORT   04/02/2013 - 04/02/2013 Radiation Therapy   04/02/13: Intraoperative radiotherapy to the left pelvis   05/10/2013 PET scan   1. Hypermetabolic mass in the deep left pelvis consistent with metastasis. 2. No additional evidence of local metastasis. No evidence of distant metastasis. 3. Small right lower lobe pulmonary nodule is not hypermetabolic.   07/27/2013 Imaging   No evidence of recurrent or metastatic disease. Apparent prior resection of the deep left pelvic metastasis.   02/08/2014 Imaging   No CT findings for recurrent or metastatic disease involving the abdomen/ pelvis   06/13/2014 Relapse/Recurrence   + recurrence paraspinous muscle   06/21/2014 Procedure   There is a soft tissue lesion along the left side of L3-L4. This lesion roughly measures up to 2.2 cm. Needle was positioned along the posterior aspect of the lesion. Small amount of air in the paraspinal tissues following needle removal   06/21/2014 Pathology Results   Bone,  biopsy, left lumbar paraspinal - METASTATIC POORLY DIFFERENTIATED CARCINOMA, CONSISTENT WITH HIGH GRADE SEROUS CARCINOMA SEE COMMENT. Microscopic Comment The carcinoma demonstrates the following immunophenotype: Cytokeratin 7 - patchy moderate to strong expression Estrogen receptor - negative expression P53 - strong diffuse expression TTF-1 - negative expression WT-1 - negative expression CD56 - focal moderate  strong expression Synaptophysin - negative expression GCDFP - negative expression The history of primary endometrial papillary serous carcinoma and primary mammary carcinoma is noted. In the current case, the overall morphologic and immunophenotype are that of poorly differentiated carcinoma, consistent with high grade serous carcinoma.    07/11/2014 - 08/12/2014 Radiation Therapy   07/11/14-08/12/14: 55 Gy in 25 fractions to the Lumbar spine   08/15/2014 Imaging   CT scan of chest, abdomen and pelvis 1. Since the biopsy study of 06/21/2014, similar to slight decrease in size of a left paraspinous lesion at L3-4. 2. No new sites of metastatic disease identified. 3. 8 mm ground-glass nodule in the right lower lobe is grossly similar to 03/10/2013, suggesting a benign etiology. 4. Cholelithiasis. 5. Apparent sigmoid colonic wall thickening which could be due to underdistention. Colitis felt less likely. 6.  Atherosclerosis, including within the coronary arteries. 7. Pelvic floor laxity.   10/07/2014 Imaging   CT scan of chest, abdomen and pelvis: Stable to slight interval decrease in size of left paraspinous lesion at L3-4. No new sites of metastatic disease. Unchanged ground-glass nodule within the right lower lobe, potentially benign in etiology. Recommend attention on followup. Cholelithiasis. Sigmoid colonic diverticulosis. No CT evidence to suggest acute diverticulitis.   01/19/2015 Imaging   Ct scan of abdomen and pelvis 1. Decrease in size of left paraspinous metastasis. 2. No new sites of disease. 3. Stable ground-glass attenuating nodule within the superior segment of right lower lobe. 4. Gallstones   05/02/2015 Imaging   Ct abdomen and pelvis: Slight interval increase in size of left lumbar paraspinal soft tissue metastasis. No new sites of metastatic disease identified within the abdomen or pelvis.    05/11/2015 PET scan   1. The small soft tissue mass just lateral to the left L3 for  neural foramen is hypermetabolic, favoring malignancy. 2. There is also a small focus of hypermetabolic activity between the left L1 and L2 transverse processes, but without a CT correlate. 3. The 13 mm sub solid nodule in the superior segment right lower lobe is stable from recent exams but has slowly increased in size over the last 7 years. Although not hypermetabolic, the appearance is concerning for the possibility of a low grade adenocarcinoma. 4. Other imaging findings of potential clinical significance: Chronic bilateral maxillary sinusitis. Coronary, aortic arch, and branch vessel atherosclerotic vascular disease. Aortoiliac atherosclerotic vascular disease. Cholelithiasis. Sigmoid colon diverticulosis. Lumbar scoliosis.   07/11/2015 Imaging   Ct scan of chest, abdomen and pelvis: 1. 13 mm mixed solid and sub solid nodule in the posterior right lower lobe was present in 2009 and has clearly progressed in the interval since that study. Imaging features remain highly concerning for low-grade or well differentiated adenocarcinoma. 2. Slight increase size of in the hypermetabolic, rim enhancing nodule identified adjacent to the left L3-4 neural foramen. Metastatic disease remains a concern. 3. No evidence for discrete soft tissue lesion between the left L1 and 2 transverse processes, the site of focal FDG uptake on the recent PET-CT. 4. Cholelithiasis. 5. Abdominal aortic atherosclerosis   08/03/2015 - 01/16/2016 Chemotherapy   She received carboplatin and Taxol   11/06/2015 PET scan  1. Hypermetabolic left N9-8 paraspinous metastasis (biopsy-proven) is stable in size and slightly decreased in metabolism. 2. Previously described small focus of hypermetabolism between the left L2 and L3 spinous processes has resolved. 3. New mild linear hypermetabolism to the left of the T11-12 spinous processes without discrete mass on the CT images, favor benign activity related activity, recommend attention on  follow-up PET-CT.  4. No definite new sites of hypermetabolic metastatic disease. No recurrent hypermetabolic metastatic disease in the pelvis. 5. Interval stability of subsolid 1.3 cm superior segment right lower lobe pulmonary nodule without associated significant metabolism, which has grown compared to the 2009 chest CT study, and remain suspicious for low grade adenocarcinoma. 6. Additional findings include aortic atherosclerosis, coronary atherosclerosis, mild multinodular goiter with no hypermetabolic thyroid nodules, cholelithiasis and moderate sigmoid diverticulosis.   02/12/2016 PET scan   1. Interval increase in size and metabolic activity of the LEFT paraspinal soft tissue metastasis. 2. New activity within the musculature of the LEFT chest wall. Given the unusual location of the paraspinal metastasis cannot exclude a second soft tissue metastasis to the musculature however favor benign physiologic activity. 3. Stable RIGHT lower lobe pulmonary nodule. 4. No evidence of local recurrence.     04/08/2016 Imaging   Ct scan of chest, abdomen and pelvis: 1. Similar size of a  left paravertebral abdominal lesion. 2. No new or progressive metastatic disease. 3. No correlate for the muscular activity about the lateral left chest wall. 4.  Coronary artery atherosclerosis. Aortic atherosclerosis. 5. Cholelithiasis. 6. Similar right lower lobe pulmonary nodule.   06/03/2016 Imaging   CT abdomen and pelvis 1. Similar to mild enlargement of a paravertebral soft tissue lesion at the L3-4 level. 2. No new sites of disease identified. 3. Cholelithiasis. 4. Hysterectomy. 5.  Aortic atherosclerosis.    09/02/2016 Imaging   CT abdomen and pelvis 1. Continue mild interval increase in size of LEFT paraspinal mass (1-2 mm). 2. No evidence of new metastatic disease in the abdomen pelvis. 3. Post hysterectomy anatomy   09/26/2016 Imaging   MR lumbar spine 1. Left paraspinal metastasis with  epicenter adjacent to the left L3 neural foramen does extend into the foramen to the level of the left lateral epidural space (series 9, image 31), but also tracks cephalad and caudal along the L2-L4 lumbar plexus (series 10, image 15). No extension into the left L2 or L4 neural foramina. And no more distal extension of tumor. 2. Abnormal signal in the medial left psoas and ventral left erector spinae muscles at L2 and L3 is probably denervation related. 3. No bone invasion or osseous metastatic disease. Suspect previous radiation of the L2 through L5 spinal levels. 4. No dural or intradural metastatic disease.    10/22/2016 Procedure   She underwent stereotactic Radiosurgery   10/30/2016 Genetic Testing   Patient has genetic testing done for Inheritable genetic mutation panel. Results revealed patient has no actionable mutation.   01/21/2017 Imaging   1. Reduced size and conspicuity of the left paraspinal mass at the L3-4 level. The mass still present but has enhancement similar to that of adjacent psoas musculature. 2. No new metastatic disease is identified. 3. Other imaging findings of potential clinical significance: Cholelithiasis. Aortic Atherosclerosis (ICD10-I70.0). Sigmoid diverticulosis. Lumbar scoliosis.   05/09/2017 Imaging   Ct scan of abdomen and pelvis 1. Paraspinal mass adjacent to the left side of L3-L4 is less distinct than prior examinations, and is therefore difficult to discretely measure, however, the overall appearance suggests continued  positive response to therapy. 2. No new signs of metastatic disease elsewhere in the abdomen or pelvis. 3. Aortic atherosclerosis, in addition to at least right coronary artery disease. Assessment for potential risk factor modification, dietary therapy or pharmacologic therapy may be warranted, if clinically indicated. 4. Colonic diverticulosis without evidence of acute diverticulitis at this time. 5. Additional incidental findings, as  above. Aortic Atherosclerosis (ICD10-I70.0).   08/14/2017 Imaging   1. Treated left paraspinal mass centered at L3-4. Mass size is stable but there has been some evolution of tumor characteristics with less central fluid seen today. Recommend continued surveillance. 2. No evidence of untreated metastasis. 3. Degenerative changes and related impingement are described above.   10/28/2017 Imaging   Stable small left paraspinal soft tissue mass. No new or progressive disease within the abdomen or pelvis.  Cholelithiasis.  No radiographic evidence of cholecystitis.  Colonic diverticulosis, without radiographic evidence of diverticulitis.   04/30/2018 Imaging   Stable post treatment change of the partially necrotic LEFT paravertebral mass at L3-L4. 18 x 21 mm cross-section. Mass effect on the LEFT L3 nerve root redemonstrated. Enhancing and atrophic LEFT psoas is inseparable.   10/15/2018 Imaging   1. Stable post treatment size and appearance of partially necrotic left paravertebral mass at L3-4, measuring 19 x 25 mm on current exam (unchanged when measured at similar level on previous study). Mass effect on the adjacent left L3 nerve root is unchanged. Adjacent enhancing and atrophic left psoas muscle. 2. Left convex scoliosis with associated multilevel degenerative spondylolysis and facet arthrosis, unchanged   10/29/2018 Imaging   Stable small left L3 paraspinal soft tissue mass. No evidence of new or progressive metastatic disease, or other acute findings.   Cholelithiasis.  No radiographic evidence of cholecystitis.   Colonic diverticulosis, without radiographic evidence of diverticulitis.   11/11/2018 PET scan   1. Low level metabolism (max SUV 2.0) associated with the irregular subsolid superior segment right lower lobe 2.1 cm pulmonary nodule. As better depicted on the recent diagnostic chest CT study, this nodule crosses the major fissure into the right upper lobe and has increased in  size and density on multiple imaging studies back to 2009. Findings are most suggestive of primary bronchogenic adenocarcinoma. 2. No hypermetabolic thoracic adenopathy. 3. Persistent hypermetabolism (max SUV 8.0) associated with the known left L3-4 paraspinous metastasis, mildly decreased in metabolism since 02/12/2016 PET-CT. 4. No additional sites of hypermetabolic metastatic disease in the abdomen or pelvis. No metabolic evidence of peritoneal recurrence. No ascites. 5. Chronic findings include: Aortic Atherosclerosis (ICD10-I70.0). Coronary atherosclerosis. Chronic bilateral maxillary sinusitis. Cholelithiasis. Moderate sigmoid diverticulosis.   01/07/2019 - 04/04/2019 Chemotherapy   The patient had carboplatin and Alimta for chemotherapy treatment.     05/03/2019 -  Chemotherapy   The patient had Pembrolizumab and Lenvima for chemotherapy treatment.     07/23/2019 PET scan   1. 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 No new sites of hypermetabolic metastatic disease on today's study. 2. Stable appearance of surgical changes in the right lung without features to suggest local recurrence or metastatic lung cancer. 3. Cholelithiasis. 4.  Aortic Atherosclerois (ICD10-170.0)   Metastatic cancer to bone ([place])  06/27/2014 Initial Diagnosis   Metastatic cancer to bone   Primary lung cancer, right ([place])  11/04/2018 Initial Diagnosis   Primary lung cancer, right ([place])   11/11/2018 PET scan   1. Low level metabolism (max SUV 2.0) associated with the irregular subsolid superior segment right  lower lobe 2.1 cm pulmonary nodule. As better depicted on the recent diagnostic chest CT study, this nodule crosses the major fissure into the right upper lobe and has increased in size and density on multiple imaging studies back to 2009. Findings are most suggestive of primary bronchogenic adenocarcinoma. 2. No hypermetabolic thoracic adenopathy. 3.  Persistent hypermetabolism (max SUV 8.0) associated with the known left L3-4 paraspinous metastasis, mildly decreased in metabolism since 02/12/2016 PET-CT. 4. No additional sites of hypermetabolic metastatic disease in the abdomen or pelvis. No metabolic evidence of peritoneal recurrence. No ascites. 5. Chronic findings include: Aortic Atherosclerosis (ICD10-I70.0). Coronary atherosclerosis. Chronic bilateral maxillary sinusitis. Cholelithiasis. Moderate sigmoid diverticulosis.   12/07/2018 Pathology Results   1. Lung, resection (segmental or lobe), Right Lobe Superior with endblock wedge of right upper lobe - INVASIVE ADENOCARCINOMA, MODERATELY DIFFERENTIATED, SPANNING 1.6 CM. - THE SURGICAL RESECTION MARGINS ARE NEGATIVE FOR CARCINOMA. - SEE ONCOLOGY TABLE BELOW. 2. Lymph node, biopsy, Level 9 #1 - THERE IS NO EVIDENCE OF CARCINOMA IN 1 OF 1 LYMPH NODE (0/1). 3. Lymph node, biopsy, Level 9 #2 - THERE IS NO EVIDENCE OF CARCINOMA IN 1 OF 1 LYMPH NODE (0/1). 4. Lymph node, biopsy, Level 7 #1 - METASTATIC CARCINOMA IN 1 OF 1 LYMPH NODE (1/1). 5. Lymph node, biopsy, Level 7 #2 - THERE IS NO EVIDENCE OF CARCINOMA IN 1 OF 1 LYMPH NODE (0/1). 6. Lymph node, biopsy, Level 12 #1 - THERE IS NO EVIDENCE OF CARCINOMA IN 1 OF 1 LYMPH NODE (0/1). 7. Lymph node, biopsy, Level 12 #2 - THERE IS NO EVIDENCE OF CARCINOMA IN 1 OF 1 LYMPH NODE (0/1). 8. Lymph node, biopsy, Level 10 #1 - THERE IS NO EVIDENCE OF CARCINOMA IN 1 OF 1 LYMPH NODE (0/1). 9. Lymph node, biopsy, Level 4R #1 - THERE IS NO EVIDENCE OF CARCINOMA IN 1 OF 1 LYMPH NODE (0/1). 10. Lymph node, biopsy, Level 4R #2 - THERE IS NO EVIDENCE OF CARCINOMA IN 1 OF 1 LYMPH NODE (0/1). 11. Lymph node, biopsy, Level 2R #1 - THERE IS NO EVIDENCE OF CARCINOMA IN 1 OF 1 LYMPH NODE (0/1). 12. Lung, resection (segmental or lobe), Right Lobe Superior - BENIGN LUNG PARENCHYMA. - THERE IS NO EVIDENCE OF MALIGNANCY. Procedure: Segmentectomy. Specimen  Laterality: Right. Tumor Site: Right superior lobe. Tumor Size: 1.6 cm (gross measurement). Tumor Focality: Unifocal. Histologic Type: Adenocarcinoma, moderately differentiated. Visceral Pleura Invasion: Not identified. Lymphovascular Invasion: Not identified. Direct Invasion of Adjacent Structures: Not identified. Margins: Negative for carcinoma. Treatment Effect: N/A Regional Lymph Nodes: Number of Lymph Nodes Involved: 1 (level 7) Number of Lymph Nodes Examined: 10 Pathologic Stage Classification (pTNM, AJCC 8th Edition): pT1b, pN2 Ancillary Studies: The tumor cells are positive for Napsin-A, TTF-1, and p53. They are essentially negative for estrogen receptor, PAX-8 and progesterone receptor. Additional studies can be performed upon clinician request. Representative Tumor Block: 1D-1E. (JBK:gt, 12/09/18)   12/07/2018 Surgery   PREOPERATIVE DIAGNOSIS:  Right lung nodule.   POSTOPERATIVE DIAGNOSIS:  Non-small cell carcinoma- suspected primary lung carcinoma, clinical stage T2N01b.   PROCEDURE:   Right video-assisted thoracoscopy, Right lower lobe superior segmentectomy with en bloc wedge resection of right upper lobe, Lymph node dissection, Intercostal nerve block levels 3-9.   SURGEON:  Modesto Charon, MD   FINDINGS:  Mass palpable in posterior aspect of superior segment of right lower lobe, likely involvement across fissure into the upper lobe.  Frozen section revealed non-small cell carcinoma. upper and lower lobe margins clear.   12/23/2018  Cancer Staging   Staging form: Lung, AJCC 8th Edition - Pathologic: Stage IIIA (pT1b, pN2, cM0) - Signed by Heath Lark, MD on 12/23/2018   01/05/2019 Imaging   MRI brain 1. No metastatic disease or acute intracranial abnormality identified. 2. Small chronic parafalcine meningioma size is stable since that described in 2006 (12 x 15 mm). 3. Advanced signal changes in the cerebral white matter and to a lesser extent pons, nonspecific but  most commonly due to chronic small vessel disease. 4. Partially visible degenerative cervical spinal stenosis.     01/07/2019 -  Chemotherapy   The patient had carboplatin and Alimta for chemotherapy treatment.     04/14/2019 PET scan   1. 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 As shown on recent MRI of 04/01/2019, there is a new component of invasion into the left lower L3 vertebral body. This new component has a maximum SUV of 10.0, compatible with active malignancy. 2. Interval wedge resection of portions of the right upper lobe and right lower lobe at the site of the prior nodule. There is only minimal low-grade activity along the wedge resection clips, maximum SUV of 1.5. Surveillance is likely warranted. No new nodule identified. 3. Other imaging findings of potential clinical significance: Aortic Atherosclerosis (ICD10-I70.0). Coronary atherosclerosis. Thoracolumbar scoliosis. Cholelithiasis. Sigmoid colon diverticulosis.     05/03/2019 -  Chemotherapy   The patient had Pembrolizumab and Lenvima for chemotherapy treatment.     05/22/2019 Imaging   Ct head Atrophy, chronic microvascular disease.   No acute intracranial abnormality.       REVIEW OF SYSTEMS:   Constitutional: Denies fevers, chills or abnormal weight loss Eyes: Denies blurriness of vision Ears, nose, mouth, throat, and face: Denies mucositis or sore throat Cardiovascular: Denies palpitation, chest discomfort or lower extremity swelling Gastrointestinal:  Denies nausea, heartburn or change in bowel habits Skin: Denies abnormal skin rashes Lymphatics: Denies new lymphadenopathy or easy bruising Neurological:Denies numbness, tingling or new weaknesses Behavioral/Psych: Mood is stable, no new changes  All other systems were reviewed with the patient and are negative.  I have reviewed the past medical history, past surgical history, social history and family history with the patient and  they are unchanged from previous note.  ALLERGIES:  is allergic to ace inhibitors; dilaudid [hydromorphone hcl]; and codeine.  MEDICATIONS:  Current Outpatient Medications  Medication Sig Dispense Refill  . amLODipine (NORVASC) 5 MG tablet Take 5 mg by mouth daily.    Marland Kitchen aspirin EC 81 MG tablet Take 81 mg by mouth daily.    Marland Kitchen atenolol (TENORMIN) 25 MG tablet Take 25 mg by mouth 2 (two) times daily.     . folic acid (FOLVITE) 1 MG tablet Take 1 mg by mouth once a week.    Marland Kitchen lenvatinib 10 mg daily dose (LENVIMA, 10 MG DAILY DOSE,) capsule Take 1 capsule (10 mg total) by mouth daily. 30 capsule 11  . levothyroxine (SYNTHROID) 25 MCG tablet Take 1 tablet (25 mcg total) by mouth daily before breakfast. 30 tablet 11  . lidocaine-prilocaine (EMLA) cream Apply to [facility] cath 1-2 hours prior to access as directed 30 g 1  . LORazepam (ATIVAN) 0.5 MG tablet 1 tablet po 30 minutes prior to radiation or MRI 30 tablet 0  . Multiple Vitamin (MULTIVITAMIN WITH MINERALS) TABS tablet Take 1 tablet by mouth daily.    . ondansetron (ZOFRAN) 8 MG tablet PLEASE SEE ATTACHED FOR DETAILED DIRECTIONS    . simvastatin (ZOCOR) 20 MG  tablet Take 20 mg by mouth every evening.      No current facility-administered medications for this visit.   Facility-Administered Medications Ordered in Other Visits  Medication Dose Route Frequency Provider Last Rate Last Admin  . heparin lock flush 100 unit/mL  500 Units Intracatheter Once Alvy Bimler, Javarious Elsayed, MD      . sodium chloride flush (NS) 0.9 % injection 10 mL  10 mL Intracatheter Once Alvy Bimler, Torsha Lemus, MD        PHYSICAL EXAMINATION: ECOG PERFORMANCE STATUS: 1 - Symptomatic but completely ambulatory  Vitals:   09/06/19 1103  BP: 139/70  Pulse: 72  Resp: 18  Temp: 97.8 F (36.6 C)  SpO2: 100%   Filed Weights   09/06/19 1103  Weight: 104 lb 12.8 oz (47.5 kg)    GENERAL:alert, no distress and comfortable SKIN: skin color, texture, turgor are normal, no rashes or significant  lesions EYES: normal, Conjunctiva are pink and non-injected, sclera clear OROPHARYNX:no exudate, no erythema and lips, buccal mucosa, and tongue normal  NECK: supple, thyroid normal size, non-tender, without nodularity LYMPH:  no palpable lymphadenopathy in the cervical, axillary or inguinal LUNGS: clear to auscultation and percussion with normal breathing effort HEART: regular rate & rhythm and no murmurs and no lower extremity edema ABDOMEN:abdomen soft, non-tender and normal bowel sounds Musculoskeletal:no cyanosis of digits and no clubbing  NEURO: alert & oriented x 3 with fluent speech, no focal motor/sensory deficits  LABORATORY DATA:  I have reviewed the data as listed    Component Value Date/Time   NA 143 09/06/2019 1030   NA 140 05/09/2017 0844   K 3.8 09/06/2019 1030   K 4.1 05/09/2017 0844   CL 111 09/06/2019 1030   CO2 20 (L) 09/06/2019 1030   CO2 26 05/09/2017 0844   GLUCOSE 110 (H) 09/06/2019 1030   GLUCOSE 82 05/09/2017 0844   BUN 26 (H) 09/06/2019 1030   BUN 20.7 05/09/2017 0844   CREATININE 1.09 (H) 09/06/2019 1030   CREATININE 0.7 05/09/2017 0844   CALCIUM 9.1 09/06/2019 1030   CALCIUM 9.3 05/09/2017 0844   PROT 6.5 09/06/2019 1030   PROT 6.8 05/09/2017 0844   ALBUMIN 3.0 (L) 09/06/2019 1030   ALBUMIN 3.6 05/09/2017 0844   AST 27 09/06/2019 1030   AST 27 05/09/2017 0844   ALT 22 09/06/2019 1030   ALT 17 05/09/2017 0844   ALKPHOS 66 09/06/2019 1030   ALKPHOS 43 05/09/2017 0844   BILITOT 0.8 09/06/2019 1030   BILITOT 0.81 05/09/2017 0844   GFRNONAA 48 (L) 09/06/2019 1030   GFRAA 56 (L) 09/06/2019 1030    No results found for: SPEP, UPEP  Lab Results  Component Value Date   WBC 7.4 09/06/2019   NEUTROABS 4.9 09/06/2019   HGB 11.3 (L) 09/06/2019   HCT 34.4 (L) 09/06/2019   MCV 100.0 09/06/2019   PLT 197 09/06/2019      Chemistry      Component Value Date/Time   NA 143 09/06/2019 1030   NA 140 05/09/2017 0844   K 3.8 09/06/2019 1030   K 4.1  05/09/2017 0844   CL 111 09/06/2019 1030   CO2 20 (L) 09/06/2019 1030   CO2 26 05/09/2017 0844   BUN 26 (H) 09/06/2019 1030   BUN 20.7 05/09/2017 0844   CREATININE 1.09 (H) 09/06/2019 1030   CREATININE 0.7 05/09/2017 0844      Component Value Date/Time   CALCIUM 9.1 09/06/2019 1030   CALCIUM 9.3 05/09/2017 0844   ALKPHOS 66  09/06/2019 1030   ALKPHOS 43 05/09/2017 0844   AST 27 09/06/2019 1030   AST 27 05/09/2017 0844   ALT 22 09/06/2019 1030   ALT 17 05/09/2017 0844   BILITOT 0.8 09/06/2019 1030   BILITOT 0.81 05/09/2017 0844

## 2019-09-06 NOTE — Patient Instructions (Signed)

## 2019-09-06 NOTE — Assessment & Plan Note (Signed)
Her blood pressure control is good She will continue current prescription antihypertensives

## 2019-09-06 NOTE — Patient Instructions (Signed)
[facility] Discharge Instructions for Patients Receiving Chemotherapy  Today you received the following chemotherapy agents:  Keytruda.  To help prevent nausea and vomiting after your treatment, we encourage you to take your nausea medication as directed.   If you develop nausea and vomiting that is not controlled by your nausea medication, call the clinic.   BELOW ARE SYMPTOMS THAT SHOULD BE REPORTED IMMEDIATELY:  *FEVER GREATER THAN 100.5 F  *CHILLS WITH OR WITHOUT FEVER  NAUSEA AND VOMITING THAT IS NOT CONTROLLED WITH YOUR NAUSEA MEDICATION  *UNUSUAL SHORTNESS OF BREATH  *UNUSUAL BRUISING OR BLEEDING  TENDERNESS IN MOUTH AND THROAT WITH OR WITHOUT PRESENCE OF ULCERS  *URINARY PROBLEMS  *BOWEL PROBLEMS  UNUSUAL RASH Items with * indicate a potential emergency and should be followed up as soon as possible.  Feel free to call the clinic should you have any questions or concerns. The clinic phone number is [phone].  Please show the CHEMO ALERT CARD at check-in to the Emergency Department and triage nurse.    

## 2019-09-06 NOTE — Telephone Encounter (Signed)
Scheduled appts per 4/26 sch msg. Pt confirmed appt dates and times.

## 2019-09-06 NOTE — Assessment & Plan Note (Signed)
She continues to complain of fatigue Her recent TSH was abnormal and she was started on Synthroid I will continue to adjust the dose of Synthroid as needed

## 2019-09-07 ENCOUNTER — Telehealth: Payer: Self-pay

## 2019-09-07 NOTE — Telephone Encounter (Signed)
-----   Message from Heath Lark, MD sent at 09/07/2019  7:48 AM EDT ----- Regarding: tell her to stop synthroid. Her labs are now showing synthroid is not needed

## 2019-09-07 NOTE — Telephone Encounter (Signed)
Called and given below message. She verbalized understanding. 

## 2019-09-09 DIAGNOSIS — C7951 Secondary malignant neoplasm of bone: Secondary | ICD-10-CM | POA: Diagnosis not present

## 2019-09-09 DIAGNOSIS — R600 Localized edema: Secondary | ICD-10-CM | POA: Diagnosis not present

## 2019-09-09 DIAGNOSIS — E039 Hypothyroidism, unspecified: Secondary | ICD-10-CM | POA: Diagnosis not present

## 2019-09-09 DIAGNOSIS — G609 Hereditary and idiopathic neuropathy, unspecified: Secondary | ICD-10-CM | POA: Diagnosis not present

## 2019-09-09 DIAGNOSIS — M79673 Pain in unspecified foot: Secondary | ICD-10-CM | POA: Diagnosis not present

## 2019-09-21 NOTE — Progress Notes (Signed)
Pharmacist Chemotherapy Monitoring - Follow Up Assessment    I verify that I have reviewed each item in the below checklist:  . Regimen for the patient is scheduled for the appropriate day and plan matches scheduled date. Marland Kitchen Appropriate non-routine labs are ordered dependent on drug ordered. . If applicable, additional medications reviewed and ordered per protocol based on lifetime cumulative doses and/or treatment regimen.   Plan for follow-up and/or issues identified: No . I-vent associated with next due treatment: No . MD and/or nursing notified: No  [name] D 09/21/2019 12:27 PM

## 2019-09-27 ENCOUNTER — Inpatient Hospital Stay (HOSPITAL_BASED_OUTPATIENT_CLINIC_OR_DEPARTMENT_OTHER): Payer: Medicare Other | Admitting: Hematology and Oncology

## 2019-09-27 ENCOUNTER — Inpatient Hospital Stay: Payer: Medicare Other

## 2019-09-27 ENCOUNTER — Inpatient Hospital Stay: Payer: Medicare Other | Attending: Hematology and Oncology

## 2019-09-27 ENCOUNTER — Other Ambulatory Visit: Payer: Self-pay

## 2019-09-27 ENCOUNTER — Encounter: Payer: Self-pay | Admitting: Hematology and Oncology

## 2019-09-27 DIAGNOSIS — K8020 Calculus of gallbladder without cholecystitis without obstruction: Secondary | ICD-10-CM | POA: Diagnosis not present

## 2019-09-27 DIAGNOSIS — E039 Hypothyroidism, unspecified: Secondary | ICD-10-CM | POA: Insufficient documentation

## 2019-09-27 DIAGNOSIS — C7951 Secondary malignant neoplasm of bone: Secondary | ICD-10-CM | POA: Insufficient documentation

## 2019-09-27 DIAGNOSIS — I2510 Atherosclerotic heart disease of native coronary artery without angina pectoris: Secondary | ICD-10-CM | POA: Diagnosis not present

## 2019-09-27 DIAGNOSIS — Z853 Personal history of malignant neoplasm of breast: Secondary | ICD-10-CM

## 2019-09-27 DIAGNOSIS — Z885 Allergy status to narcotic agent status: Secondary | ICD-10-CM | POA: Diagnosis not present

## 2019-09-27 DIAGNOSIS — Z5112 Encounter for antineoplastic immunotherapy: Secondary | ICD-10-CM | POA: Insufficient documentation

## 2019-09-27 DIAGNOSIS — Z7189 Other specified counseling: Secondary | ICD-10-CM

## 2019-09-27 DIAGNOSIS — C549 Malignant neoplasm of corpus uteri, unspecified: Secondary | ICD-10-CM

## 2019-09-27 DIAGNOSIS — C541 Malignant neoplasm of endometrium: Secondary | ICD-10-CM | POA: Insufficient documentation

## 2019-09-27 DIAGNOSIS — I700 Atherosclerosis of aorta: Secondary | ICD-10-CM | POA: Insufficient documentation

## 2019-09-27 DIAGNOSIS — R7989 Other specified abnormal findings of blood chemistry: Secondary | ICD-10-CM | POA: Diagnosis not present

## 2019-09-27 DIAGNOSIS — I959 Hypotension, unspecified: Secondary | ICD-10-CM | POA: Insufficient documentation

## 2019-09-27 DIAGNOSIS — Z79899 Other long term (current) drug therapy: Secondary | ICD-10-CM | POA: Insufficient documentation

## 2019-09-27 DIAGNOSIS — C3431 Malignant neoplasm of lower lobe, right bronchus or lung: Secondary | ICD-10-CM | POA: Insufficient documentation

## 2019-09-27 DIAGNOSIS — Z95828 Presence of other vascular implants and grafts: Secondary | ICD-10-CM

## 2019-09-27 DIAGNOSIS — D701 Agranulocytosis secondary to cancer chemotherapy: Secondary | ICD-10-CM

## 2019-09-27 LAB — CMP (CANCER CENTER ONLY)
ALT: 35 U/L (ref 0–44)
AST: 39 U/L (ref 15–41)
Albumin: 3.7 g/dL (ref 3.5–5.0)
Alkaline Phosphatase: 58 U/L (ref 38–126)
Anion gap: 15 (ref 5–15)
BUN: 33 mg/dL — ABNORMAL HIGH (ref 8–23)
CO2: 17 mmol/L — ABNORMAL LOW (ref 22–32)
Calcium: 9.9 mg/dL (ref 8.9–10.3)
Chloride: 108 mmol/L (ref 98–111)
Creatinine: 1.2 mg/dL — ABNORMAL HIGH (ref 0.44–1.00)
GFR, Est AFR Am: 50 mL/min — ABNORMAL LOW (ref >60)
GFR, Estimated: 43 mL/min — ABNORMAL LOW (ref >60)
Glucose, Bld: 110 mg/dL — ABNORMAL HIGH (ref 70–99)
Potassium: 3.8 mmol/L (ref 3.5–5.1)
Sodium: 140 mmol/L (ref 135–145)
Total Bilirubin: 1 mg/dL (ref 0.3–1.2)
Total Protein: 7.3 g/dL (ref 6.5–8.1)

## 2019-09-27 LAB — CBC WITH DIFFERENTIAL (CANCER CENTER ONLY)
Abs Immature Granulocytes: 0.02 10*3/uL (ref 0.00–0.07)
Basophils Absolute: 0.1 10*3/uL (ref 0.0–0.1)
Basophils Relative: 1 %
Eosinophils Absolute: 0.3 10*3/uL (ref 0.0–0.5)
Eosinophils Relative: 5 %
HCT: 39.5 % (ref 36.0–46.0)
Hemoglobin: 13.2 g/dL (ref 12.0–15.0)
Immature Granulocytes: 0 %
Lymphocytes Relative: 27 %
Lymphs Abs: 1.9 10*3/uL (ref 0.7–4.0)
MCH: 33 pg (ref 26.0–34.0)
MCHC: 33.4 g/dL (ref 30.0–36.0)
MCV: 98.8 fL (ref 80.0–100.0)
Monocytes Absolute: 1.1 10*3/uL — ABNORMAL HIGH (ref 0.1–1.0)
Monocytes Relative: 15 %
Neutro Abs: 3.8 10*3/uL (ref 1.7–7.7)
Neutrophils Relative %: 52 %
Platelet Count: 157 10*3/uL (ref 150–400)
RBC: 4 MIL/uL (ref 3.87–5.11)
RDW: 13.7 % (ref 11.5–15.5)
WBC Count: 7.2 10*3/uL (ref 4.0–10.5)
nRBC: 0 % (ref 0.0–0.2)

## 2019-09-27 LAB — TSH: TSH: <0.08 u[IU]/mL — ABNORMAL LOW (ref 0.308–3.960)

## 2019-09-27 LAB — T4, FREE: Free T4: 1.83 ng/dL — ABNORMAL HIGH (ref 0.61–1.12)

## 2019-09-27 MED ORDER — SODIUM CHLORIDE 0.9 % IV SOLN
Freq: Once | INTRAVENOUS | Status: AC
  Filled 2019-09-27: qty 250

## 2019-09-27 MED ORDER — PALONOSETRON HCL INJECTION 0.25 MG/5ML
INTRAVENOUS | Status: AC
  Filled 2019-09-27: qty 5

## 2019-09-27 MED ORDER — SODIUM CHLORIDE 0.9 % IV SOLN
200.0000 mg | Freq: Once | INTRAVENOUS | Status: AC
  Administered 2019-09-27: 200 mg via INTRAVENOUS
  Filled 2019-09-27: qty 8

## 2019-09-27 MED ORDER — FAMOTIDINE IN NACL 20-0.9 MG/50ML-% IV SOLN
INTRAVENOUS | Status: AC
  Filled 2019-09-27: qty 50

## 2019-09-27 MED ORDER — HEPARIN SOD (PORK) LOCK FLUSH 100 UNIT/ML IV SOLN
500.0000 [IU] | Freq: Once | INTRAVENOUS | Status: AC | PRN
  Administered 2019-09-27: 500 [IU]
  Filled 2019-09-27: qty 5

## 2019-09-27 MED ORDER — DIPHENHYDRAMINE HCL 50 MG/ML IJ SOLN
INTRAMUSCULAR | Status: AC
  Filled 2019-09-27: qty 1

## 2019-09-27 MED ORDER — SODIUM CHLORIDE 0.9% FLUSH
10.0000 mL | Freq: Once | INTRAVENOUS | Status: AC
  Administered 2019-09-27: 10 mL
  Filled 2019-09-27: qty 10

## 2019-09-27 MED ORDER — SODIUM CHLORIDE 0.9% FLUSH
10.0000 mL | INTRAVENOUS | Status: DC | PRN
  Administered 2019-09-27: 10 mL
  Filled 2019-09-27: qty 10

## 2019-09-27 MED ORDER — ACETAMINOPHEN 325 MG PO TABS
ORAL_TABLET | ORAL | Status: AC
  Filled 2019-09-27: qty 2

## 2019-09-27 NOTE — Progress Notes (Signed)
[place] OFFICE PROGRESS NOTE  Patient Care Team: Shon Baton, MD as PCP - General (Internal Medicine) Heath Lark, MD as Consulting Physician (Hematology and Oncology)  ASSESSMENT & PLAN:  Endometrial cancer [facility]) Her last PET scan showed excellent response to treatment She tolerated treatment well without major side effects The plan will be to continue treatment indefinitely Her next imaging study would be in September  Arterial hypotension Her blood pressure control is good She will continue current prescription antihypertensives  Acquired hypothyroidism She was started on thyroid replacement therapy in March Subsequently, or her TSH remains suppressed She is instructed to stop taking levothyroxine/Synthroid for now I will continue to check a TSH/free thyroxine level with each visit  Elevated serum creatinine She has mild intermittent elevated serum creatinine We will monitor carefully   No orders of the defined types were placed in this encounter.   All questions were answered. The patient knows to call the clinic with any problems, questions or concerns. The total time spent in the appointment was 20 minutes encounter with patients including review of chart and various tests results, discussions about plan of care and coordination of care plan   Heath Lark, MD 09/27/2019 11:19 AM  INTERVAL HISTORY: Please see below for problem oriented charting. She returns for treatment and follow-up She tolerated pembrolizumab well She has not brought blood pressure readings from home but according to the patient, her blood pressure at home is fine No recent infection, fever or chills Denies any infusion reaction She denies back pain She has appointment to follow-up with her thoracic surgeon next week for history of lung cancer.  SUMMARY OF ONCOLOGIC HISTORY: Oncology History Overview Note  Biopsy of recurrence 06-2014 ER negative. PDL1 testing low at 2% on the  uterine cancer She progressed on carboplatin and Paclitaxel for uterine cancer HER 2 negative on pathology [id number] Negative genetic testing  On her lung cancer sample, [employer] [facility] testing revealed 10% PD-1 positive EGFR mutation was detected, exon 19 deletion   Endometrial cancer ([place])  12/01/2007 Initial Diagnosis   She has recurrent papillary serous endometrial carcinoma. Briefly she was diagnosed in 2009 with a FIGO IB pappilary serous carcinoma treated with hysterectomy followed by adjuvant chemo and vaginal brachytherapy. She then had a recurrence diagnosed in late 2012 that was deemed not to be surgically resectable, and was treated with 6 cycles of carbo/taxol followed by 5040 cGy of EBRT to the pelvic mass. She was then followed with serial imaging and was noted in June of this year to have slight increase in the size of the mass, and again in September there was small enlargement of the mass. She had a re-staging PET scan on 03/10/13 that showed FDG activity in the pelvic mass, with no other areas of disease   12/01/2007 Imaging   CT scan of chest, abdomen and pelvis: 1.  Mass like area of decreased attenuation in the central uterus, consistent with the known endometrial carcinoma.  Deep myometrial invasion is suspected.  Pelvic MRI without and with contrast could be performed for further staging workup if clinically warranted. 2.  No evidence of extrauterine extension or lymphadenopathy. 3.  Sigmoid diverticulosis incidentally noted.   12/15/2007 Surgery   --12/2007 laparoscopic hysterectomy and PLND --05/2008 complted 6 cycles adjuvant carbo/taxol followed by vaginal cuff brachy --03/2011 exploratory lararoscopy, ureterolysis --4/13 completed 6 cycels carbotaxol --8/13 completed EBRT to the left pelvic sidewall    06/10/2008 Imaging   CT scan abdomen and pelvis 1.  Nonspecific mildly prominent inguinal lymph nodes, right greater than left. 2.  Interval hysterectomy without  evidence of recurrent pelvic mass or fluid collection.   03/22/2011 Imaging   Ct scan abdomen and pelvis 1.  Local uterine cancer recurrence with two large nodes along the left pelvic sidewall. 2.  No evidence of bowel obstruction, urinary obstruction, or more distant metastasis.   03/31/2011 Relapse/Recurrence   chemotherapy and external beam radiation   05/16/2011 - 09/17/2011 Chemotherapy   She had 6 cycles of carboplatin and Taxol    07/17/2011 Imaging   Ct scan abdomen and pelvis 1.  Interval improvement in the previously demonstrated left pelvic local recurrence of tumor. 2.  No disease progression or complication identified. 3.  Mild bladder wall thickening on the right, nonspecific and possibly related to incomplete distension and/or radiation therapy. 4.  Cholelithiasis   10/11/2011 Imaging   CT scan abdomen and pelvis 1.  Interval enlargement of the left pelvic sidewall masses. 2.  No new nodular disease in the pelvis or lymphadenopathy. 3.  No evidence  of distant metastasis. 4.  Mild hydronephrosis on the right is similar to prior. 5.  Focal thickening of the right aspect the bladder is stable compared to prior.    10/28/2011 - 12/05/2011 Radiation Therapy   radiation for pelvic sidewall recurrence   10/28/2011 - 12/05/2011 Radiation Therapy   10/28/11-12/05/11: Radiotherapy to the left pelvic sidewall   03/18/2012 Imaging   CT abdomen 1.  Today's study demonstrates a positive response to therapy with decreased size of left pelvic sidewall nodal masses, as detailed above.  No new soft tissue masses or new lymphadenopathy is identified within the abdomen or pelvis. Continue attention on follow-up studies is recommended. 2.  Cholelithiasis without findings to suggest acute cholecystitis. 3.  Colonic diverticulosis without findings to suggest acute diverticulitis at this time. 4.  Mild asymmetric urinary bladder wall thickening is unchanged compared to prior examinations and is  nonspecific.  No definite bladder wall mass is identified at this time. 5.  Additional findings, similar to prior examinations, as above   06/22/2012 Imaging   CT abdomen 1.  Further decreased size of the left pelvic peripherally enhancing lesion. 2.  No new sites of new or progressive disease. 3. Esophageal air fluid level suggests dysmotility or gastroesophageal reflux. 4.  Cholelithiasis   11/02/2012 Imaging   CT abdomen 1.  The left pelvic side wall lesion demonstrates mild increase in size from previous exam. 2.  No new sites of new or progressive disease. 3.  Cholelithiasis.    01/28/2013 Imaging   CT abdomen 1. Interval small increase in volume of enhancing mass adjacent to the left pelvic sidewall. 2. No evidence of abdominal or pelvic lymphadenopathy   04/02/2013 Surgery   pelvic mass resection with IORT   04/02/2013 - 04/02/2013 Radiation Therapy   04/02/13: Intraoperative radiotherapy to the left pelvis   05/10/2013 PET scan   1. Hypermetabolic mass in the deep left pelvis consistent with metastasis. 2. No additional evidence of local metastasis. No evidence of distant metastasis. 3. Small right lower lobe pulmonary nodule is not hypermetabolic.   07/27/2013 Imaging   No evidence of recurrent or metastatic disease. Apparent prior resection of the deep left pelvic metastasis.   02/08/2014 Imaging   No CT findings for recurrent or metastatic disease involving the abdomen/ pelvis   06/13/2014 Relapse/Recurrence   + recurrence paraspinous muscle   06/21/2014 Procedure   There is a soft tissue lesion along the  left side of L3-L4. This lesion roughly measures up to 2.2 cm. Needle was positioned along the posterior aspect of the lesion. Small amount of air in the paraspinal tissues following needle removal   06/21/2014 Pathology Results   Bone, biopsy, left lumbar paraspinal - METASTATIC POORLY DIFFERENTIATED CARCINOMA, CONSISTENT WITH HIGH GRADE SEROUS CARCINOMA  SEE COMMENT. Microscopic Comment The carcinoma demonstrates the following immunophenotype: Cytokeratin 7 - patchy moderate to strong expression Estrogen receptor - negative expression P53 - strong diffuse expression TTF-1 - negative expression WT-1 - negative expression CD56 - focal moderate strong expression Synaptophysin - negative expression GCDFP - negative expression The history of primary endometrial papillary serous carcinoma and primary mammary carcinoma is noted. In the current case, the overall morphologic and immunophenotype are that of poorly differentiated carcinoma, consistent with high grade serous carcinoma.    07/11/2014 - 08/12/2014 Radiation Therapy   07/11/14-08/12/14: 55 Gy in 25 fractions to the Lumbar spine   08/15/2014 Imaging   CT scan of chest, abdomen and pelvis 1. Since the biopsy study of 06/21/2014, similar to slight decrease in size of a left paraspinous lesion at L3-4. 2. No new sites of metastatic disease identified. 3. 8 mm ground-glass nodule in the right lower lobe is grossly similar to 03/10/2013, suggesting a benign etiology. 4. Cholelithiasis. 5. Apparent sigmoid colonic wall thickening which could be due to underdistention. Colitis felt less likely. 6.  Atherosclerosis, including within the coronary arteries. 7. Pelvic floor laxity.   10/07/2014 Imaging   CT scan of chest, abdomen and pelvis: Stable to slight interval decrease in size of left paraspinous lesion at L3-4. No new sites of metastatic disease. Unchanged ground-glass nodule within the right lower lobe, potentially benign in etiology. Recommend attention on followup. Cholelithiasis. Sigmoid colonic diverticulosis. No CT evidence to suggest acute diverticulitis.   01/19/2015 Imaging   Ct scan of abdomen and pelvis 1. Decrease in size of left paraspinous metastasis. 2. No new sites of disease. 3. Stable ground-glass attenuating nodule within the superior segment of right lower lobe. 4.  Gallstones   05/02/2015 Imaging   Ct abdomen and pelvis: Slight interval increase in size of left lumbar paraspinal soft tissue metastasis. No new sites of metastatic disease identified within the abdomen or pelvis.    05/11/2015 PET scan   1. The small soft tissue mass just lateral to the left L3 for neural foramen is hypermetabolic, favoring malignancy. 2. There is also a small focus of hypermetabolic activity between the left L1 and L2 transverse processes, but without a CT correlate. 3. The 13 mm sub solid nodule in the superior segment right lower lobe is stable from recent exams but has slowly increased in size over the last 7 years. Although not hypermetabolic, the appearance is concerning for the possibility of a low grade adenocarcinoma. 4. Other imaging findings of potential clinical significance: Chronic bilateral maxillary sinusitis. Coronary, aortic arch, and branch vessel atherosclerotic vascular disease. Aortoiliac atherosclerotic vascular disease. Cholelithiasis. Sigmoid colon diverticulosis. Lumbar scoliosis.   07/11/2015 Imaging   Ct scan of chest, abdomen and pelvis: 1. 13 mm mixed solid and sub solid nodule in the posterior right lower lobe was present in 2009 and has clearly progressed in the interval since that study. Imaging features remain highly concerning for low-grade or well differentiated adenocarcinoma. 2. Slight increase size of in the hypermetabolic, rim enhancing nodule identified adjacent to the left L3-4 neural foramen. Metastatic disease remains a concern. 3. No evidence for discrete soft tissue lesion between the  left L1 and 2 transverse processes, the site of focal FDG uptake on the recent PET-CT. 4. Cholelithiasis. 5. Abdominal aortic atherosclerosis   08/03/2015 - 01/16/2016 Chemotherapy   She received carboplatin and Taxol   11/06/2015 PET scan   1. Hypermetabolic left N4-6 paraspinous metastasis (biopsy-proven) is stable in size and slightly decreased in  metabolism. 2. Previously described small focus of hypermetabolism between the left L2 and L3 spinous processes has resolved. 3. New mild linear hypermetabolism to the left of the T11-12 spinous processes without discrete mass on the CT images, favor benign activity related activity, recommend attention on follow-up PET-CT.  4. No definite new sites of hypermetabolic metastatic disease. No recurrent hypermetabolic metastatic disease in the pelvis. 5. Interval stability of subsolid 1.3 cm superior segment right lower lobe pulmonary nodule without associated significant metabolism, which has grown compared to the 2009 chest CT study, and remain suspicious for low grade adenocarcinoma. 6. Additional findings include aortic atherosclerosis, coronary atherosclerosis, mild multinodular goiter with no hypermetabolic thyroid nodules, cholelithiasis and moderate sigmoid diverticulosis.   02/12/2016 PET scan   1. Interval increase in size and metabolic activity of the LEFT paraspinal soft tissue metastasis. 2. New activity within the musculature of the LEFT chest wall. Given the unusual location of the paraspinal metastasis cannot exclude a second soft tissue metastasis to the musculature however favor benign physiologic activity. 3. Stable RIGHT lower lobe pulmonary nodule. 4. No evidence of local recurrence.     04/08/2016 Imaging   Ct scan of chest, abdomen and pelvis: 1. Similar size of a  left paravertebral abdominal lesion. 2. No new or progressive metastatic disease. 3. No correlate for the muscular activity about the lateral left chest wall. 4.  Coronary artery atherosclerosis. Aortic atherosclerosis. 5. Cholelithiasis. 6. Similar right lower lobe pulmonary nodule.   06/03/2016 Imaging   CT abdomen and pelvis 1. Similar to mild enlargement of a paravertebral soft tissue lesion at the L3-4 level. 2. No new sites of disease identified. 3. Cholelithiasis. 4. Hysterectomy. 5.  Aortic  atherosclerosis.    09/02/2016 Imaging   CT abdomen and pelvis 1. Continue mild interval increase in size of LEFT paraspinal mass (1-2 mm). 2. No evidence of new metastatic disease in the abdomen pelvis. 3. Post hysterectomy anatomy   09/26/2016 Imaging   MR lumbar spine 1. Left paraspinal metastasis with epicenter adjacent to the left L3 neural foramen does extend into the foramen to the level of the left lateral epidural space (series 9, image 31), but also tracks cephalad and caudal along the L2-L4 lumbar plexus (series 10, image 15). No extension into the left L2 or L4 neural foramina. And no more distal extension of tumor. 2. Abnormal signal in the medial left psoas and ventral left erector spinae muscles at L2 and L3 is probably denervation related. 3. No bone invasion or osseous metastatic disease. Suspect previous radiation of the L2 through L5 spinal levels. 4. No dural or intradural metastatic disease.    10/22/2016 Procedure   She underwent stereotactic Radiosurgery   10/30/2016 Genetic Testing   Patient has genetic testing done for Inheritable genetic mutation panel. Results revealed patient has no actionable mutation.   01/21/2017 Imaging   1. Reduced size and conspicuity of the left paraspinal mass at the L3-4 level. The mass still present but has enhancement similar to that of adjacent psoas musculature. 2. No new metastatic disease is identified. 3. Other imaging findings of potential clinical significance: Cholelithiasis. Aortic Atherosclerosis (ICD10-I70.0). Sigmoid diverticulosis. Lumbar  scoliosis.   05/09/2017 Imaging   Ct scan of abdomen and pelvis 1. Paraspinal mass adjacent to the left side of L3-L4 is less distinct than prior examinations, and is therefore difficult to discretely measure, however, the overall appearance suggests continued positive response to therapy. 2. No new signs of metastatic disease elsewhere in the abdomen or pelvis. 3. Aortic  atherosclerosis, in addition to at least right coronary artery disease. Assessment for potential risk factor modification, dietary therapy or pharmacologic therapy may be warranted, if clinically indicated. 4. Colonic diverticulosis without evidence of acute diverticulitis at this time. 5. Additional incidental findings, as above. Aortic Atherosclerosis (ICD10-I70.0).   08/14/2017 Imaging   1. Treated left paraspinal mass centered at L3-4. Mass size is stable but there has been some evolution of tumor characteristics with less central fluid seen today. Recommend continued surveillance. 2. No evidence of untreated metastasis. 3. Degenerative changes and related impingement are described above.   10/28/2017 Imaging   Stable small left paraspinal soft tissue mass. No new or progressive disease within the abdomen or pelvis.  Cholelithiasis.  No radiographic evidence of cholecystitis.  Colonic diverticulosis, without radiographic evidence of diverticulitis.   04/30/2018 Imaging   Stable post treatment change of the partially necrotic LEFT paravertebral mass at L3-L4. 18 x 21 mm cross-section. Mass effect on the LEFT L3 nerve root redemonstrated. Enhancing and atrophic LEFT psoas is inseparable.   10/15/2018 Imaging   1. Stable post treatment size and appearance of partially necrotic left paravertebral mass at L3-4, measuring 19 x 25 mm on current exam (unchanged when measured at similar level on previous study). Mass effect on the adjacent left L3 nerve root is unchanged. Adjacent enhancing and atrophic left psoas muscle. 2. Left convex scoliosis with associated multilevel degenerative spondylolysis and facet arthrosis, unchanged   10/29/2018 Imaging   Stable small left L3 paraspinal soft tissue mass. No evidence of new or progressive metastatic disease, or other acute findings.   Cholelithiasis.  No radiographic evidence of cholecystitis.   Colonic diverticulosis, without radiographic evidence  of diverticulitis.   11/11/2018 PET scan   1. Low level metabolism (max SUV 2.0) associated with the irregular subsolid superior segment right lower lobe 2.1 cm pulmonary nodule. As better depicted on the recent diagnostic chest CT study, this nodule crosses the major fissure into the right upper lobe and has increased in size and density on multiple imaging studies back to 2009. Findings are most suggestive of primary bronchogenic adenocarcinoma. 2. No hypermetabolic thoracic adenopathy. 3. Persistent hypermetabolism (max SUV 8.0) associated with the known left L3-4 paraspinous metastasis, mildly decreased in metabolism since 02/12/2016 PET-CT. 4. No additional sites of hypermetabolic metastatic disease in the abdomen or pelvis. No metabolic evidence of peritoneal recurrence. No ascites. 5. Chronic findings include: Aortic Atherosclerosis (ICD10-I70.0). Coronary atherosclerosis. Chronic bilateral maxillary sinusitis. Cholelithiasis. Moderate sigmoid diverticulosis.   01/07/2019 - 04/04/2019 Chemotherapy   The patient had carboplatin and Alimta for chemotherapy treatment.     05/03/2019 -  Chemotherapy   The patient had Pembrolizumab and Lenvima for chemotherapy treatment.     07/23/2019 PET scan   1. 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 No new sites of hypermetabolic metastatic disease on today's study. 2. Stable appearance of surgical changes in the right lung without features to suggest local recurrence or metastatic lung cancer. 3. Cholelithiasis. 4.  Aortic Atherosclerois (ICD10-170.0)   Metastatic cancer to bone ([place])  06/27/2014 Initial Diagnosis   Metastatic cancer  to bone   Primary lung cancer, right ([place])  11/04/2018 Initial Diagnosis   Primary lung cancer, right ([place])   11/11/2018 PET scan   1. Low level metabolism (max SUV 2.0) associated with the irregular subsolid superior segment right lower lobe 2.1 cm pulmonary  nodule. As better depicted on the recent diagnostic chest CT study, this nodule crosses the major fissure into the right upper lobe and has increased in size and density on multiple imaging studies back to 2009. Findings are most suggestive of primary bronchogenic adenocarcinoma. 2. No hypermetabolic thoracic adenopathy. 3. Persistent hypermetabolism (max SUV 8.0) associated with the known left L3-4 paraspinous metastasis, mildly decreased in metabolism since 02/12/2016 PET-CT. 4. No additional sites of hypermetabolic metastatic disease in the abdomen or pelvis. No metabolic evidence of peritoneal recurrence. No ascites. 5. Chronic findings include: Aortic Atherosclerosis (ICD10-I70.0). Coronary atherosclerosis. Chronic bilateral maxillary sinusitis. Cholelithiasis. Moderate sigmoid diverticulosis.   12/07/2018 Pathology Results   1. Lung, resection (segmental or lobe), Right Lobe Superior with endblock wedge of right upper lobe - INVASIVE ADENOCARCINOMA, MODERATELY DIFFERENTIATED, SPANNING 1.6 CM. - THE SURGICAL RESECTION MARGINS ARE NEGATIVE FOR CARCINOMA. - SEE ONCOLOGY TABLE BELOW. 2. Lymph node, biopsy, Level 9 #1 - THERE IS NO EVIDENCE OF CARCINOMA IN 1 OF 1 LYMPH NODE (0/1). 3. Lymph node, biopsy, Level 9 #2 - THERE IS NO EVIDENCE OF CARCINOMA IN 1 OF 1 LYMPH NODE (0/1). 4. Lymph node, biopsy, Level 7 #1 - METASTATIC CARCINOMA IN 1 OF 1 LYMPH NODE (1/1). 5. Lymph node, biopsy, Level 7 #2 - THERE IS NO EVIDENCE OF CARCINOMA IN 1 OF 1 LYMPH NODE (0/1). 6. Lymph node, biopsy, Level 12 #1 - THERE IS NO EVIDENCE OF CARCINOMA IN 1 OF 1 LYMPH NODE (0/1). 7. Lymph node, biopsy, Level 12 #2 - THERE IS NO EVIDENCE OF CARCINOMA IN 1 OF 1 LYMPH NODE (0/1). 8. Lymph node, biopsy, Level 10 #1 - THERE IS NO EVIDENCE OF CARCINOMA IN 1 OF 1 LYMPH NODE (0/1). 9. Lymph node, biopsy, Level 4R #1 - THERE IS NO EVIDENCE OF CARCINOMA IN 1 OF 1 LYMPH NODE (0/1). 10. Lymph node, biopsy, Level 4R #2 - THERE  IS NO EVIDENCE OF CARCINOMA IN 1 OF 1 LYMPH NODE (0/1). 11. Lymph node, biopsy, Level 2R #1 - THERE IS NO EVIDENCE OF CARCINOMA IN 1 OF 1 LYMPH NODE (0/1). 12. Lung, resection (segmental or lobe), Right Lobe Superior - BENIGN LUNG PARENCHYMA. - THERE IS NO EVIDENCE OF MALIGNANCY. Procedure: Segmentectomy. Specimen Laterality: Right. Tumor Site: Right superior lobe. Tumor Size: 1.6 cm (gross measurement). Tumor Focality: Unifocal. Histologic Type: Adenocarcinoma, moderately differentiated. Visceral Pleura Invasion: Not identified. Lymphovascular Invasion: Not identified. Direct Invasion of Adjacent Structures: Not identified. Margins: Negative for carcinoma. Treatment Effect: N/A Regional Lymph Nodes: Number of Lymph Nodes Involved: 1 (level 7) Number of Lymph Nodes Examined: 10 Pathologic Stage Classification (pTNM, AJCC 8th Edition): pT1b, pN2 Ancillary Studies: The tumor cells are positive for Napsin-A, TTF-1, and p53. They are essentially negative for estrogen receptor, PAX-8 and progesterone receptor. Additional studies can be performed upon clinician request. Representative Tumor Block: 1D-1E. (JBK:gt, 12/09/18)   12/07/2018 Surgery   PREOPERATIVE DIAGNOSIS:  Right lung nodule.   POSTOPERATIVE DIAGNOSIS:  Non-small cell carcinoma- suspected primary lung carcinoma, clinical stage T2N01b.   PROCEDURE:   Right video-assisted thoracoscopy, Right lower lobe superior segmentectomy with en bloc wedge resection of right upper lobe, Lymph node dissection, Intercostal nerve block levels 3-9.   SURGEON:  Remo Lipps  Roxan Hockey, MD   FINDINGS:  Mass palpable in posterior aspect of superior segment of right lower lobe, likely involvement across fissure into the upper lobe.  Frozen section revealed non-small cell carcinoma. upper and lower lobe margins clear.   12/23/2018 Cancer Staging   Staging form: Lung, AJCC 8th Edition - Pathologic: Stage IIIA (pT1b, pN2, cM0) - Signed by Heath Lark,  MD on 12/23/2018   01/05/2019 Imaging   MRI brain 1. No metastatic disease or acute intracranial abnormality identified. 2. Small chronic parafalcine meningioma size is stable since that described in 2006 (12 x 15 mm). 3. Advanced signal changes in the cerebral white matter and to a lesser extent pons, nonspecific but most commonly due to chronic small vessel disease. 4. Partially visible degenerative cervical spinal stenosis.     01/07/2019 -  Chemotherapy   The patient had carboplatin and Alimta for chemotherapy treatment.     04/14/2019 PET scan   1. 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 As shown on recent MRI of 04/01/2019, there is a new component of invasion into the left lower L3 vertebral body. This new component has a maximum SUV of 10.0, compatible with active malignancy. 2. Interval wedge resection of portions of the right upper lobe and right lower lobe at the site of the prior nodule. There is only minimal low-grade activity along the wedge resection clips, maximum SUV of 1.5. Surveillance is likely warranted. No new nodule identified. 3. Other imaging findings of potential clinical significance: Aortic Atherosclerosis (ICD10-I70.0). Coronary atherosclerosis. Thoracolumbar scoliosis. Cholelithiasis. Sigmoid colon diverticulosis.     05/03/2019 -  Chemotherapy   The patient had Pembrolizumab and Lenvima for chemotherapy treatment.     05/22/2019 Imaging   Ct head Atrophy, chronic microvascular disease.   No acute intracranial abnormality.       REVIEW OF SYSTEMS:   Constitutional: Denies fevers, chills or abnormal weight loss Eyes: Denies blurriness of vision Ears, nose, mouth, throat, and face: Denies mucositis or sore throat Respiratory: Denies cough, dyspnea or wheezes Cardiovascular: Denies palpitation, chest discomfort or lower extremity swelling Gastrointestinal:  Denies nausea, heartburn or change in bowel habits Skin: Denies  abnormal skin rashes Lymphatics: Denies new lymphadenopathy or easy bruising Neurological:Denies numbness, tingling or new weaknesses Behavioral/Psych: Mood is stable, no new changes  All other systems were reviewed with the patient and are negative.  I have reviewed the past medical history, past surgical history, social history and family history with the patient and they are unchanged from previous note.  ALLERGIES:  is allergic to ace inhibitors; dilaudid [hydromorphone hcl]; and codeine.  MEDICATIONS:  Current Outpatient Medications  Medication Sig Dispense Refill  . amLODipine (NORVASC) 5 MG tablet Take 5 mg by mouth daily.    Marland Kitchen aspirin EC 81 MG tablet Take 81 mg by mouth daily.    Marland Kitchen atenolol (TENORMIN) 25 MG tablet Take 25 mg by mouth 2 (two) times daily.     Marland Kitchen lenvatinib 10 mg daily dose (LENVIMA, 10 MG DAILY DOSE,) capsule Take 1 capsule (10 mg total) by mouth daily. 30 capsule 11  . lidocaine-prilocaine (EMLA) cream Apply to [facility] cath 1-2 hours prior to access as directed 30 g 1  . LORazepam (ATIVAN) 0.5 MG tablet 1 tablet po 30 minutes prior to radiation or MRI 30 tablet 0  . Multiple Vitamin (MULTIVITAMIN WITH MINERALS) TABS tablet Take 1 tablet by mouth daily.    . ondansetron (ZOFRAN) 8 MG tablet PLEASE SEE ATTACHED FOR DETAILED  DIRECTIONS    . simvastatin (ZOCOR) 20 MG tablet Take 20 mg by mouth every evening.      No current facility-administered medications for this visit.   Facility-Administered Medications Ordered in Other Visits  Medication Dose Route Frequency Provider Last Rate Last Admin  . heparin lock flush 100 unit/mL  500 Units Intracatheter Once Alvy Bimler, , MD      . sodium chloride flush (NS) 0.9 % injection 10 mL  10 mL Intracatheter Once Alvy Bimler, , MD      . sodium chloride flush (NS) 0.9 % injection 10 mL  10 mL Intracatheter PRN Alvy Bimler, , MD   10 mL at 09/27/19 1110    PHYSICAL EXAMINATION: ECOG PERFORMANCE STATUS: 1 - Symptomatic but  completely ambulatory  Vitals:   09/27/19 0914  BP: 131/79  Pulse: 100  Resp: 18  Temp: 98.2 F (36.8 C)  SpO2: 99%   Filed Weights   09/27/19 0914  Weight: 97 lb 6.4 oz (44.2 kg)    GENERAL:alert, no distress and comfortable SKIN: skin color, texture, turgor are normal, no rashes or significant lesions EYES: normal, Conjunctiva are pink and non-injected, sclera clear OROPHARYNX:no exudate, no erythema and lips, buccal mucosa, and tongue normal  NECK: supple, thyroid normal size, non-tender, without nodularity LYMPH:  no palpable lymphadenopathy in the cervical, axillary or inguinal LUNGS: clear to auscultation and percussion with normal breathing effort HEART: regular rate & rhythm and no murmurs and no lower extremity edema ABDOMEN:abdomen soft, non-tender and normal bowel sounds Musculoskeletal:no cyanosis of digits and no clubbing  NEURO: alert & oriented x 3 with fluent speech, no focal motor/sensory deficits  LABORATORY DATA:  I have reviewed the data as listed    Component Value Date/Time   NA 140 09/27/2019 0905   NA 140 05/09/2017 0844   K 3.8 09/27/2019 0905   K 4.1 05/09/2017 0844   CL 108 09/27/2019 0905   CO2 17 (L) 09/27/2019 0905   CO2 26 05/09/2017 0844   GLUCOSE 110 (H) 09/27/2019 0905   GLUCOSE 82 05/09/2017 0844   BUN 33 (H) 09/27/2019 0905   BUN 20.7 05/09/2017 0844   CREATININE 1.20 (H) 09/27/2019 0905   CREATININE 0.7 05/09/2017 0844   CALCIUM 9.9 09/27/2019 0905   CALCIUM 9.3 05/09/2017 0844   PROT 7.3 09/27/2019 0905   PROT 6.8 05/09/2017 0844   ALBUMIN 3.7 09/27/2019 0905   ALBUMIN 3.6 05/09/2017 0844   AST 39 09/27/2019 0905   AST 27 05/09/2017 0844   ALT 35 09/27/2019 0905   ALT 17 05/09/2017 0844   ALKPHOS 58 09/27/2019 0905   ALKPHOS 43 05/09/2017 0844   BILITOT 1.0 09/27/2019 0905   BILITOT 0.81 05/09/2017 0844   GFRNONAA 43 (L) 09/27/2019 0905   GFRAA 50 (L) 09/27/2019 0905    No results found for: SPEP, UPEP  Lab Results   Component Value Date   WBC 7.2 09/27/2019   NEUTROABS 3.8 09/27/2019   HGB 13.2 09/27/2019   HCT 39.5 09/27/2019   MCV 98.8 09/27/2019   PLT 157 09/27/2019      Chemistry      Component Value Date/Time   NA 140 09/27/2019 0905   NA 140 05/09/2017 0844   K 3.8 09/27/2019 0905   K 4.1 05/09/2017 0844   CL 108 09/27/2019 0905   CO2 17 (L) 09/27/2019 0905   CO2 26 05/09/2017 0844   BUN 33 (H) 09/27/2019 0905   BUN 20.7 05/09/2017 0844   CREATININE 1.20 (H)  09/27/2019 0905   CREATININE 0.7 05/09/2017 0844      Component Value Date/Time   CALCIUM 9.9 09/27/2019 0905   CALCIUM 9.3 05/09/2017 0844   ALKPHOS 58 09/27/2019 0905   ALKPHOS 43 05/09/2017 0844   AST 39 09/27/2019 0905   AST 27 05/09/2017 0844   ALT 35 09/27/2019 0905   ALT 17 05/09/2017 0844   BILITOT 1.0 09/27/2019 0905   BILITOT 0.81 05/09/2017 0844

## 2019-09-27 NOTE — Assessment & Plan Note (Signed)
Her last PET scan showed excellent response to treatment She tolerated treatment well without major side effects The plan will be to continue treatment indefinitely Her next imaging study would be in September

## 2019-09-27 NOTE — Assessment & Plan Note (Signed)
Her blood pressure control is good She will continue current prescription antihypertensives

## 2019-09-27 NOTE — Assessment & Plan Note (Signed)
She has mild intermittent elevated serum creatinine We will monitor carefully

## 2019-09-27 NOTE — Assessment & Plan Note (Signed)
She was started on thyroid replacement therapy in March Subsequently, or her TSH remains suppressed She is instructed to stop taking levothyroxine/Synthroid for now I will continue to check a TSH/free thyroxine level with each visit

## 2019-09-27 NOTE — Patient Instructions (Signed)
[facility] Discharge Instructions for Patients Receiving Chemotherapy  Today you received the following chemotherapy agents:  Keytruda.  To help prevent nausea and vomiting after your treatment, we encourage you to take your nausea medication as directed.   If you develop nausea and vomiting that is not controlled by your nausea medication, call the clinic.   BELOW ARE SYMPTOMS THAT SHOULD BE REPORTED IMMEDIATELY:  *FEVER GREATER THAN 100.5 F  *CHILLS WITH OR WITHOUT FEVER  NAUSEA AND VOMITING THAT IS NOT CONTROLLED WITH YOUR NAUSEA MEDICATION  *UNUSUAL SHORTNESS OF BREATH  *UNUSUAL BRUISING OR BLEEDING  TENDERNESS IN MOUTH AND THROAT WITH OR WITHOUT PRESENCE OF ULCERS  *URINARY PROBLEMS  *BOWEL PROBLEMS  UNUSUAL RASH Items with * indicate a potential emergency and should be followed up as soon as possible.  Feel free to call the clinic should you have any questions or concerns. The clinic phone number is [phone].  Please show the CHEMO ALERT CARD at check-in to the Emergency Department and triage nurse.    

## 2019-10-04 ENCOUNTER — Other Ambulatory Visit: Payer: Self-pay | Admitting: Thoracic Surgery (Cardiothoracic Vascular Surgery)

## 2019-10-04 ENCOUNTER — Other Ambulatory Visit: Payer: Self-pay | Admitting: Hematology and Oncology

## 2019-10-04 DIAGNOSIS — C3491 Malignant neoplasm of unspecified part of right bronchus or lung: Secondary | ICD-10-CM

## 2019-10-04 MED ORDER — AMLODIPINE BESYLATE 5 MG PO TABS: 5.0000 mg | ORAL_TABLET | Freq: Every day | ORAL | 3 refills | Status: DC

## 2019-10-05 ENCOUNTER — Encounter: Payer: Medicare Other | Admitting: Thoracic Surgery (Cardiothoracic Vascular Surgery)

## 2019-10-15 DIAGNOSIS — R5383 Other fatigue: Secondary | ICD-10-CM | POA: Diagnosis not present

## 2019-10-15 DIAGNOSIS — C3431 Malignant neoplasm of lower lobe, right bronchus or lung: Secondary | ICD-10-CM | POA: Diagnosis not present

## 2019-10-15 DIAGNOSIS — F4321 Adjustment disorder with depressed mood: Secondary | ICD-10-CM | POA: Diagnosis not present

## 2019-10-15 DIAGNOSIS — N1831 Chronic kidney disease, stage 3a: Secondary | ICD-10-CM | POA: Diagnosis not present

## 2019-10-15 DIAGNOSIS — R634 Abnormal weight loss: Secondary | ICD-10-CM | POA: Diagnosis not present

## 2019-10-15 DIAGNOSIS — R600 Localized edema: Secondary | ICD-10-CM | POA: Diagnosis not present

## 2019-10-15 DIAGNOSIS — C55 Malignant neoplasm of uterus, part unspecified: Secondary | ICD-10-CM | POA: Diagnosis not present

## 2019-10-15 DIAGNOSIS — J309 Allergic rhinitis, unspecified: Secondary | ICD-10-CM | POA: Diagnosis not present

## 2019-10-15 DIAGNOSIS — E039 Hypothyroidism, unspecified: Secondary | ICD-10-CM | POA: Diagnosis not present

## 2019-10-15 DIAGNOSIS — M79673 Pain in unspecified foot: Secondary | ICD-10-CM | POA: Diagnosis not present

## 2019-10-15 DIAGNOSIS — E46 Unspecified protein-calorie malnutrition: Secondary | ICD-10-CM | POA: Diagnosis not present

## 2019-10-15 DIAGNOSIS — I129 Hypertensive chronic kidney disease with stage 1 through stage 4 chronic kidney disease, or unspecified chronic kidney disease: Secondary | ICD-10-CM | POA: Diagnosis not present

## 2019-10-18 ENCOUNTER — Inpatient Hospital Stay: Payer: Medicare Other

## 2019-10-18 ENCOUNTER — Encounter: Payer: Self-pay | Admitting: Hematology and Oncology

## 2019-10-18 ENCOUNTER — Inpatient Hospital Stay: Payer: Medicare Other | Attending: Hematology and Oncology

## 2019-10-18 ENCOUNTER — Inpatient Hospital Stay (HOSPITAL_BASED_OUTPATIENT_CLINIC_OR_DEPARTMENT_OTHER): Payer: Medicare Other | Admitting: Hematology and Oncology

## 2019-10-18 ENCOUNTER — Other Ambulatory Visit: Payer: Self-pay

## 2019-10-18 VITALS — BP 139/79 | HR 82 | Temp 98.4°F | Resp 18 | Ht 59.0 in | Wt 95.6 lb

## 2019-10-18 DIAGNOSIS — I700 Atherosclerosis of aorta: Secondary | ICD-10-CM | POA: Diagnosis not present

## 2019-10-18 DIAGNOSIS — K5730 Diverticulosis of large intestine without perforation or abscess without bleeding: Secondary | ICD-10-CM | POA: Insufficient documentation

## 2019-10-18 DIAGNOSIS — Z7901 Long term (current) use of anticoagulants: Secondary | ICD-10-CM | POA: Diagnosis not present

## 2019-10-18 DIAGNOSIS — R64 Cachexia: Secondary | ICD-10-CM | POA: Diagnosis not present

## 2019-10-18 DIAGNOSIS — C7951 Secondary malignant neoplasm of bone: Secondary | ICD-10-CM

## 2019-10-18 DIAGNOSIS — Z5112 Encounter for antineoplastic immunotherapy: Secondary | ICD-10-CM | POA: Insufficient documentation

## 2019-10-18 DIAGNOSIS — C541 Malignant neoplasm of endometrium: Secondary | ICD-10-CM | POA: Diagnosis not present

## 2019-10-18 DIAGNOSIS — E039 Hypothyroidism, unspecified: Secondary | ICD-10-CM

## 2019-10-18 DIAGNOSIS — J984 Other disorders of lung: Secondary | ICD-10-CM | POA: Insufficient documentation

## 2019-10-18 DIAGNOSIS — C3491 Malignant neoplasm of unspecified part of right bronchus or lung: Secondary | ICD-10-CM | POA: Diagnosis not present

## 2019-10-18 DIAGNOSIS — Z7189 Other specified counseling: Secondary | ICD-10-CM

## 2019-10-18 DIAGNOSIS — I2510 Atherosclerotic heart disease of native coronary artery without angina pectoris: Secondary | ICD-10-CM | POA: Diagnosis not present

## 2019-10-18 DIAGNOSIS — R040 Epistaxis: Secondary | ICD-10-CM | POA: Diagnosis not present

## 2019-10-18 DIAGNOSIS — C549 Malignant neoplasm of corpus uteri, unspecified: Secondary | ICD-10-CM

## 2019-10-18 DIAGNOSIS — Z171 Estrogen receptor negative status [ER-]: Secondary | ICD-10-CM | POA: Insufficient documentation

## 2019-10-18 DIAGNOSIS — I10 Essential (primary) hypertension: Secondary | ICD-10-CM | POA: Diagnosis not present

## 2019-10-18 DIAGNOSIS — K8020 Calculus of gallbladder without cholecystitis without obstruction: Secondary | ICD-10-CM | POA: Diagnosis not present

## 2019-10-18 DIAGNOSIS — Z923 Personal history of irradiation: Secondary | ICD-10-CM | POA: Insufficient documentation

## 2019-10-18 DIAGNOSIS — J320 Chronic maxillary sinusitis: Secondary | ICD-10-CM | POA: Insufficient documentation

## 2019-10-18 DIAGNOSIS — Z79899 Other long term (current) drug therapy: Secondary | ICD-10-CM | POA: Insufficient documentation

## 2019-10-18 DIAGNOSIS — Z885 Allergy status to narcotic agent status: Secondary | ICD-10-CM | POA: Diagnosis not present

## 2019-10-18 LAB — CBC WITH DIFFERENTIAL (CANCER CENTER ONLY)
Abs Immature Granulocytes: 0.02 10*3/uL (ref 0.00–0.07)
Basophils Absolute: 0 10*3/uL (ref 0.0–0.1)
Basophils Relative: 1 %
Eosinophils Absolute: 0.2 10*3/uL (ref 0.0–0.5)
Eosinophils Relative: 3 %
HCT: 37.2 % (ref 36.0–46.0)
Hemoglobin: 12.2 g/dL (ref 12.0–15.0)
Immature Granulocytes: 0 %
Lymphocytes Relative: 15 %
Lymphs Abs: 0.9 10*3/uL (ref 0.7–4.0)
MCH: 32.8 pg (ref 26.0–34.0)
MCHC: 32.8 g/dL (ref 30.0–36.0)
MCV: 100 fL (ref 80.0–100.0)
Monocytes Absolute: 0.9 10*3/uL (ref 0.1–1.0)
Monocytes Relative: 15 %
Neutro Abs: 4.2 10*3/uL (ref 1.7–7.7)
Neutrophils Relative %: 66 %
Platelet Count: 172 10*3/uL (ref 150–400)
RBC: 3.72 MIL/uL — ABNORMAL LOW (ref 3.87–5.11)
RDW: 13.3 % (ref 11.5–15.5)
WBC Count: 6.3 10*3/uL (ref 4.0–10.5)
nRBC: 0 % (ref 0.0–0.2)

## 2019-10-18 LAB — CMP (CANCER CENTER ONLY)
ALT: 25 U/L (ref 0–44)
AST: 28 U/L (ref 15–41)
Albumin: 3.1 g/dL — ABNORMAL LOW (ref 3.5–5.0)
Alkaline Phosphatase: 67 U/L (ref 38–126)
Anion gap: 14 (ref 5–15)
BUN: 21 mg/dL (ref 8–23)
CO2: 19 mmol/L — ABNORMAL LOW (ref 22–32)
Calcium: 9.5 mg/dL (ref 8.9–10.3)
Chloride: 110 mmol/L (ref 98–111)
Creatinine: 0.95 mg/dL (ref 0.44–1.00)
GFR, Est AFR Am: >60 mL/min (ref >60)
GFR, Estimated: 57 mL/min — ABNORMAL LOW (ref >60)
Glucose, Bld: 114 mg/dL — ABNORMAL HIGH (ref 70–99)
Potassium: 4.2 mmol/L (ref 3.5–5.1)
Sodium: 143 mmol/L (ref 135–145)
Total Bilirubin: 0.7 mg/dL (ref 0.3–1.2)
Total Protein: 6.8 g/dL (ref 6.5–8.1)

## 2019-10-18 LAB — T4, FREE: Free T4: 1.06 ng/dL (ref 0.61–1.12)

## 2019-10-18 LAB — TSH: TSH: <0.08 u[IU]/mL — ABNORMAL LOW (ref 0.308–3.960)

## 2019-10-18 MED ORDER — SODIUM CHLORIDE 0.9 % IV SOLN
200.0000 mg | Freq: Once | INTRAVENOUS | Status: AC
  Administered 2019-10-18: 200 mg via INTRAVENOUS
  Filled 2019-10-18: qty 8

## 2019-10-18 MED ORDER — SODIUM CHLORIDE 0.9 % IV SOLN
Freq: Once | INTRAVENOUS | Status: AC
  Filled 2019-10-18: qty 250

## 2019-10-18 MED ORDER — HEPARIN SOD (PORK) LOCK FLUSH 100 UNIT/ML IV SOLN
500.0000 [IU] | Freq: Once | INTRAVENOUS | Status: AC | PRN
  Administered 2019-10-18: 500 [IU]
  Filled 2019-10-18: qty 5

## 2019-10-18 MED ORDER — SODIUM CHLORIDE 0.9% FLUSH
10.0000 mL | INTRAVENOUS | Status: DC | PRN
  Administered 2019-10-18: 10 mL
  Filled 2019-10-18: qty 10

## 2019-10-18 NOTE — Assessment & Plan Note (Signed)
She has no signs or symptoms of relapse in her lung We will continue serial imaging study to follow

## 2019-10-18 NOTE — Assessment & Plan Note (Signed)
Her blood pressure control is good She will continue current prescription antihypertensives

## 2019-10-18 NOTE — Progress Notes (Signed)
[place] OFFICE PROGRESS NOTE  Patient Care Team: Shon Baton, MD as PCP - General (Internal Medicine) Heath Lark, MD as Consulting Physician (Hematology and Oncology)  ASSESSMENT & PLAN:  Endometrial cancer [facility]) Her last PET scan showed excellent response to treatment She tolerated treatment well without major side effects The plan will be to continue treatment indefinitely I recommend repeat imaging study before I see her next  Primary lung cancer, right [facility]) She has no signs or symptoms of relapse in her lung We will continue serial imaging study to follow  Metastatic cancer to bone [facility]) She has no signs of cancer recurrence I recommend calcium with vitamin D supplement to prevent severe osteoporosis We also discussed strength training exercises   Essential hypertension Her blood pressure control is good She will continue current prescription antihypertensives   Orders Placed This Encounter  Procedures  . NM PET Image Restag (PS) Skull Base To Thigh    Standing Status:   Future    Standing Expiration Date:   10/17/2020    Order Specific Question:   If indicated for the ordered procedure, I authorize the administration of a radiopharmaceutical per Radiology protocol    Answer:   Yes    Order Specific Question:   Preferred imaging location?    Answer:   [facility]    Order Specific Question:   Radiology Contrast Protocol - do NOT remove file path    Answer:   [email or web address]    Order Specific Question:   ** REASON FOR EXAM (FREE TEXT)    Answer:   assess response to chemo    All questions were answered. The patient knows to call the clinic with any problems, questions or concerns. The total time spent in the appointment was 25 minutes encounter with patients including review of chart and various tests results, discussions about plan of care and coordination of care plan   Heath Lark, MD 10/18/2019 9:55  AM  INTERVAL HISTORY: Please see below for problem oriented charting. She returns for chemotherapy and follow-up She is doing well No side effects or infusion reaction She checks her blood pressure at home and it was good No new bone pain Denies recent cough, chest pain or shortness of breath  SUMMARY OF ONCOLOGIC HISTORY: Oncology History Overview Note  Biopsy of recurrence 06-2014 ER negative. PDL1 testing low at 2% on the uterine cancer She progressed on carboplatin and Paclitaxel for uterine cancer HER 2 negative on pathology [id number] Negative genetic testing  On her lung cancer sample, [employer] testing revealed 10% PD-1 positive EGFR mutation was detected, exon 19 deletion   Endometrial cancer ([place])  12/01/2007 Initial Diagnosis   She has recurrent papillary serous endometrial carcinoma. Briefly she was diagnosed in 2009 with a FIGO IB pappilary serous carcinoma treated with hysterectomy followed by adjuvant chemo and vaginal brachytherapy. She then had a recurrence diagnosed in late 2012 that was deemed not to be surgically resectable, and was treated with 6 cycles of carbo/taxol followed by 5040 cGy of EBRT to the pelvic mass. She was then followed with serial imaging and was noted in June of this year to have slight increase in the size of the mass, and again in September there was small enlargement of the mass. She had a re-staging PET scan on 03/10/13 that showed FDG activity in the pelvic mass, with no other areas of disease   12/01/2007 Imaging   CT scan of chest, abdomen and pelvis: 1.  Mass like area of decreased attenuation in the central uterus, consistent with the known endometrial carcinoma.  Deep myometrial invasion is suspected.  Pelvic MRI without and with contrast could be performed for further staging workup if clinically warranted. 2.  No evidence of extrauterine extension or lymphadenopathy. 3.  Sigmoid diverticulosis incidentally noted.   12/15/2007 Surgery    --12/2007 laparoscopic hysterectomy and PLND --05/2008 complted 6 cycles adjuvant carbo/taxol followed by vaginal cuff brachy --03/2011 exploratory lararoscopy, ureterolysis --4/13 completed 6 cycels carbotaxol --8/13 completed EBRT to the left pelvic sidewall    06/10/2008 Imaging   CT scan abdomen and pelvis 1.  Nonspecific mildly prominent inguinal lymph nodes, right greater than left. 2.  Interval hysterectomy without evidence of recurrent pelvic mass or fluid collection.   03/22/2011 Imaging   Ct scan abdomen and pelvis 1.  Local uterine cancer recurrence with two large nodes along the left pelvic sidewall. 2.  No evidence of bowel obstruction, urinary obstruction, or more distant metastasis.   03/31/2011 Relapse/Recurrence   chemotherapy and external beam radiation   05/16/2011 - 09/17/2011 Chemotherapy   She had 6 cycles of carboplatin and Taxol    07/17/2011 Imaging   Ct scan abdomen and pelvis 1.  Interval improvement in the previously demonstrated left pelvic local recurrence of tumor. 2.  No disease progression or complication identified. 3.  Mild bladder wall thickening on the right, nonspecific and possibly related to incomplete distension and/or radiation therapy. 4.  Cholelithiasis   10/11/2011 Imaging   CT scan abdomen and pelvis 1.  Interval enlargement of the left pelvic sidewall masses. 2.  No new nodular disease in the pelvis or lymphadenopathy. 3.  No evidence  of distant metastasis. 4.  Mild hydronephrosis on the right is similar to prior. 5.  Focal thickening of the right aspect the bladder is stable compared to prior.    10/28/2011 - 12/05/2011 Radiation Therapy   radiation for pelvic sidewall recurrence   10/28/2011 - 12/05/2011 Radiation Therapy   10/28/11-12/05/11: Radiotherapy to the left pelvic sidewall   03/18/2012 Imaging   CT abdomen 1.  Today's study demonstrates a positive response to therapy with decreased size of left pelvic sidewall nodal masses,  as detailed above.  No new soft tissue masses or new lymphadenopathy is identified within the abdomen or pelvis. Continue attention on follow-up studies is recommended. 2.  Cholelithiasis without findings to suggest acute cholecystitis. 3.  Colonic diverticulosis without findings to suggest acute diverticulitis at this time. 4.  Mild asymmetric urinary bladder wall thickening is unchanged compared to prior examinations and is nonspecific.  No definite bladder wall mass is identified at this time. 5.  Additional findings, similar to prior examinations, as above   06/22/2012 Imaging   CT abdomen 1.  Further decreased size of the left pelvic peripherally enhancing lesion. 2.  No new sites of new or progressive disease. 3. Esophageal air fluid level suggests dysmotility or gastroesophageal reflux. 4.  Cholelithiasis   11/02/2012 Imaging   CT abdomen 1.  The left pelvic side wall lesion demonstrates mild increase in size from previous exam. 2.  No new sites of new or progressive disease. 3.  Cholelithiasis.    01/28/2013 Imaging   CT abdomen 1. Interval small increase in volume of enhancing mass adjacent to the left pelvic sidewall. 2. No evidence of abdominal or pelvic lymphadenopathy   04/02/2013 Surgery   pelvic mass resection with IORT   04/02/2013 - 04/02/2013 Radiation Therapy   04/02/13: Intraoperative radiotherapy to  the left pelvis   05/10/2013 PET scan   1. Hypermetabolic mass in the deep left pelvis consistent with metastasis. 2. No additional evidence of local metastasis. No evidence of distant metastasis. 3. Small right lower lobe pulmonary nodule is not hypermetabolic.   07/27/2013 Imaging   No evidence of recurrent or metastatic disease. Apparent prior resection of the deep left pelvic metastasis.   02/08/2014 Imaging   No CT findings for recurrent or metastatic disease involving the abdomen/ pelvis   06/13/2014 Relapse/Recurrence   + recurrence paraspinous muscle    06/21/2014 Procedure   There is a soft tissue lesion along the left side of L3-L4. This lesion roughly measures up to 2.2 cm. Needle was positioned along the posterior aspect of the lesion. Small amount of air in the paraspinal tissues following needle removal   06/21/2014 Pathology Results   Bone, biopsy, left lumbar paraspinal - METASTATIC POORLY DIFFERENTIATED CARCINOMA, CONSISTENT WITH HIGH GRADE SEROUS CARCINOMA SEE COMMENT. Microscopic Comment The carcinoma demonstrates the following immunophenotype: Cytokeratin 7 - patchy moderate to strong expression Estrogen receptor - negative expression P53 - strong diffuse expression TTF-1 - negative expression WT-1 - negative expression CD56 - focal moderate strong expression Synaptophysin - negative expression GCDFP - negative expression The history of primary endometrial papillary serous carcinoma and primary mammary carcinoma is noted. In the current case, the overall morphologic and immunophenotype are that of poorly differentiated carcinoma, consistent with high grade serous carcinoma.    07/11/2014 - 08/12/2014 Radiation Therapy   07/11/14-08/12/14: 55 Gy in 25 fractions to the Lumbar spine   08/15/2014 Imaging   CT scan of chest, abdomen and pelvis 1. Since the biopsy study of 06/21/2014, similar to slight decrease in size of a left paraspinous lesion at L3-4. 2. No new sites of metastatic disease identified. 3. 8 mm ground-glass nodule in the right lower lobe is grossly similar to 03/10/2013, suggesting a benign etiology. 4. Cholelithiasis. 5. Apparent sigmoid colonic wall thickening which could be due to underdistention. Colitis felt less likely. 6.  Atherosclerosis, including within the coronary arteries. 7. Pelvic floor laxity.   10/07/2014 Imaging   CT scan of chest, abdomen and pelvis: Stable to slight interval decrease in size of left paraspinous lesion at L3-4. No new sites of metastatic disease. Unchanged ground-glass nodule within  the right lower lobe, potentially benign in etiology. Recommend attention on followup. Cholelithiasis. Sigmoid colonic diverticulosis. No CT evidence to suggest acute diverticulitis.   01/19/2015 Imaging   Ct scan of abdomen and pelvis 1. Decrease in size of left paraspinous metastasis. 2. No new sites of disease. 3. Stable ground-glass attenuating nodule within the superior segment of right lower lobe. 4. Gallstones   05/02/2015 Imaging   Ct abdomen and pelvis: Slight interval increase in size of left lumbar paraspinal soft tissue metastasis. No new sites of metastatic disease identified within the abdomen or pelvis.    05/11/2015 PET scan   1. The small soft tissue mass just lateral to the left L3 for neural foramen is hypermetabolic, favoring malignancy. 2. There is also a small focus of hypermetabolic activity between the left L1 and L2 transverse processes, but without a CT correlate. 3. The 13 mm sub solid nodule in the superior segment right lower lobe is stable from recent exams but has slowly increased in size over the last 7 years. Although not hypermetabolic, the appearance is concerning for the possibility of a low grade adenocarcinoma. 4. Other imaging findings of potential clinical significance: Chronic bilateral maxillary  sinusitis. Coronary, aortic arch, and branch vessel atherosclerotic vascular disease. Aortoiliac atherosclerotic vascular disease. Cholelithiasis. Sigmoid colon diverticulosis. Lumbar scoliosis.   07/11/2015 Imaging   Ct scan of chest, abdomen and pelvis: 1. 13 mm mixed solid and sub solid nodule in the posterior right lower lobe was present in 2009 and has clearly progressed in the interval since that study. Imaging features remain highly concerning for low-grade or well differentiated adenocarcinoma. 2. Slight increase size of in the hypermetabolic, rim enhancing nodule identified adjacent to the left L3-4 neural foramen. Metastatic disease remains a  concern. 3. No evidence for discrete soft tissue lesion between the left L1 and 2 transverse processes, the site of focal FDG uptake on the recent PET-CT. 4. Cholelithiasis. 5. Abdominal aortic atherosclerosis   08/03/2015 - 01/16/2016 Chemotherapy   She received carboplatin and Taxol   11/06/2015 PET scan   1. Hypermetabolic left H2-0 paraspinous metastasis (biopsy-proven) is stable in size and slightly decreased in metabolism. 2. Previously described small focus of hypermetabolism between the left L2 and L3 spinous processes has resolved. 3. New mild linear hypermetabolism to the left of the T11-12 spinous processes without discrete mass on the CT images, favor benign activity related activity, recommend attention on follow-up PET-CT.  4. No definite new sites of hypermetabolic metastatic disease. No recurrent hypermetabolic metastatic disease in the pelvis. 5. Interval stability of subsolid 1.3 cm superior segment right lower lobe pulmonary nodule without associated significant metabolism, which has grown compared to the 2009 chest CT study, and remain suspicious for low grade adenocarcinoma. 6. Additional findings include aortic atherosclerosis, coronary atherosclerosis, mild multinodular goiter with no hypermetabolic thyroid nodules, cholelithiasis and moderate sigmoid diverticulosis.   02/12/2016 PET scan   1. Interval increase in size and metabolic activity of the LEFT paraspinal soft tissue metastasis. 2. New activity within the musculature of the LEFT chest wall. Given the unusual location of the paraspinal metastasis cannot exclude a second soft tissue metastasis to the musculature however favor benign physiologic activity. 3. Stable RIGHT lower lobe pulmonary nodule. 4. No evidence of local recurrence.     04/08/2016 Imaging   Ct scan of chest, abdomen and pelvis: 1. Similar size of a  left paravertebral abdominal lesion. 2. No new or progressive metastatic disease. 3. No  correlate for the muscular activity about the lateral left chest wall. 4.  Coronary artery atherosclerosis. Aortic atherosclerosis. 5. Cholelithiasis. 6. Similar right lower lobe pulmonary nodule.   06/03/2016 Imaging   CT abdomen and pelvis 1. Similar to mild enlargement of a paravertebral soft tissue lesion at the L3-4 level. 2. No new sites of disease identified. 3. Cholelithiasis. 4. Hysterectomy. 5.  Aortic atherosclerosis.    09/02/2016 Imaging   CT abdomen and pelvis 1. Continue mild interval increase in size of LEFT paraspinal mass (1-2 mm). 2. No evidence of new metastatic disease in the abdomen pelvis. 3. Post hysterectomy anatomy   09/26/2016 Imaging   MR lumbar spine 1. Left paraspinal metastasis with epicenter adjacent to the left L3 neural foramen does extend into the foramen to the level of the left lateral epidural space (series 9, image 31), but also tracks cephalad and caudal along the L2-L4 lumbar plexus (series 10, image 15). No extension into the left L2 or L4 neural foramina. And no more distal extension of tumor. 2. Abnormal signal in the medial left psoas and ventral left erector spinae muscles at L2 and L3 is probably denervation related. 3. No bone invasion or osseous metastatic disease. Suspect  previous radiation of the L2 through L5 spinal levels. 4. No dural or intradural metastatic disease.    10/22/2016 Procedure   She underwent stereotactic Radiosurgery   10/30/2016 Genetic Testing   Patient has genetic testing done for Inheritable genetic mutation panel. Results revealed patient has no actionable mutation.   01/21/2017 Imaging   1. Reduced size and conspicuity of the left paraspinal mass at the L3-4 level. The mass still present but has enhancement similar to that of adjacent psoas musculature. 2. No new metastatic disease is identified. 3. Other imaging findings of potential clinical significance: Cholelithiasis. Aortic Atherosclerosis (ICD10-I70.0).  Sigmoid diverticulosis. Lumbar scoliosis.   05/09/2017 Imaging   Ct scan of abdomen and pelvis 1. Paraspinal mass adjacent to the left side of L3-L4 is less distinct than prior examinations, and is therefore difficult to discretely measure, however, the overall appearance suggests continued positive response to therapy. 2. No new signs of metastatic disease elsewhere in the abdomen or pelvis. 3. Aortic atherosclerosis, in addition to at least right coronary artery disease. Assessment for potential risk factor modification, dietary therapy or pharmacologic therapy may be warranted, if clinically indicated. 4. Colonic diverticulosis without evidence of acute diverticulitis at this time. 5. Additional incidental findings, as above. Aortic Atherosclerosis (ICD10-I70.0).   08/14/2017 Imaging   1. Treated left paraspinal mass centered at L3-4. Mass size is stable but there has been some evolution of tumor characteristics with less central fluid seen today. Recommend continued surveillance. 2. No evidence of untreated metastasis. 3. Degenerative changes and related impingement are described above.   10/28/2017 Imaging   Stable small left paraspinal soft tissue mass. No new or progressive disease within the abdomen or pelvis.  Cholelithiasis.  No radiographic evidence of cholecystitis.  Colonic diverticulosis, without radiographic evidence of diverticulitis.   04/30/2018 Imaging   Stable post treatment change of the partially necrotic LEFT paravertebral mass at L3-L4. 18 x 21 mm cross-section. Mass effect on the LEFT L3 nerve root redemonstrated. Enhancing and atrophic LEFT psoas is inseparable.   10/15/2018 Imaging   1. Stable post treatment size and appearance of partially necrotic left paravertebral mass at L3-4, measuring 19 x 25 mm on current exam (unchanged when measured at similar level on previous study). Mass effect on the adjacent left L3 nerve root is unchanged. Adjacent enhancing and  atrophic left psoas muscle. 2. Left convex scoliosis with associated multilevel degenerative spondylolysis and facet arthrosis, unchanged   10/29/2018 Imaging   Stable small left L3 paraspinal soft tissue mass. No evidence of new or progressive metastatic disease, or other acute findings.   Cholelithiasis.  No radiographic evidence of cholecystitis.   Colonic diverticulosis, without radiographic evidence of diverticulitis.   11/11/2018 PET scan   1. Low level metabolism (max SUV 2.0) associated with the irregular subsolid superior segment right lower lobe 2.1 cm pulmonary nodule. As better depicted on the recent diagnostic chest CT study, this nodule crosses the major fissure into the right upper lobe and has increased in size and density on multiple imaging studies back to 2009. Findings are most suggestive of primary bronchogenic adenocarcinoma. 2. No hypermetabolic thoracic adenopathy. 3. Persistent hypermetabolism (max SUV 8.0) associated with the known left L3-4 paraspinous metastasis, mildly decreased in metabolism since 02/12/2016 PET-CT. 4. No additional sites of hypermetabolic metastatic disease in the abdomen or pelvis. No metabolic evidence of peritoneal recurrence. No ascites. 5. Chronic findings include: Aortic Atherosclerosis (ICD10-I70.0). Coronary atherosclerosis. Chronic bilateral maxillary sinusitis. Cholelithiasis. Moderate sigmoid diverticulosis.   01/07/2019 - 04/04/2019  Chemotherapy   The patient had carboplatin and Alimta for chemotherapy treatment.     05/03/2019 -  Chemotherapy   The patient had Pembrolizumab and Lenvima for chemotherapy treatment.     07/23/2019 PET scan   1. 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 No new sites of hypermetabolic metastatic disease on today's study. 2. Stable appearance of surgical changes in the right lung without features to suggest local recurrence or metastatic lung  cancer. 3. Cholelithiasis. 4.  Aortic Atherosclerois (ICD10-170.0)   Metastatic cancer to bone ([place])  06/27/2014 Initial Diagnosis   Metastatic cancer to bone   Primary lung cancer, right ([place])  11/04/2018 Initial Diagnosis   Primary lung cancer, right ([place])   11/11/2018 PET scan   1. Low level metabolism (max SUV 2.0) associated with the irregular subsolid superior segment right lower lobe 2.1 cm pulmonary nodule. As better depicted on the recent diagnostic chest CT study, this nodule crosses the major fissure into the right upper lobe and has increased in size and density on multiple imaging studies back to 2009. Findings are most suggestive of primary bronchogenic adenocarcinoma. 2. No hypermetabolic thoracic adenopathy. 3. Persistent hypermetabolism (max SUV 8.0) associated with the known left L3-4 paraspinous metastasis, mildly decreased in metabolism since 02/12/2016 PET-CT. 4. No additional sites of hypermetabolic metastatic disease in the abdomen or pelvis. No metabolic evidence of peritoneal recurrence. No ascites. 5. Chronic findings include: Aortic Atherosclerosis (ICD10-I70.0). Coronary atherosclerosis. Chronic bilateral maxillary sinusitis. Cholelithiasis. Moderate sigmoid diverticulosis.   12/07/2018 Pathology Results   1. Lung, resection (segmental or lobe), Right Lobe Superior with endblock wedge of right upper lobe - INVASIVE ADENOCARCINOMA, MODERATELY DIFFERENTIATED, SPANNING 1.6 CM. - THE SURGICAL RESECTION MARGINS ARE NEGATIVE FOR CARCINOMA. - SEE ONCOLOGY TABLE BELOW. 2. Lymph node, biopsy, Level 9 #1 - THERE IS NO EVIDENCE OF CARCINOMA IN 1 OF 1 LYMPH NODE (0/1). 3. Lymph node, biopsy, Level 9 #2 - THERE IS NO EVIDENCE OF CARCINOMA IN 1 OF 1 LYMPH NODE (0/1). 4. Lymph node, biopsy, Level 7 #1 - METASTATIC CARCINOMA IN 1 OF 1 LYMPH NODE (1/1). 5. Lymph node, biopsy, Level 7 #2 - THERE IS NO EVIDENCE OF CARCINOMA IN 1 OF 1 LYMPH NODE (0/1). 6. Lymph node, biopsy, Level  12 #1 - THERE IS NO EVIDENCE OF CARCINOMA IN 1 OF 1 LYMPH NODE (0/1). 7. Lymph node, biopsy, Level 12 #2 - THERE IS NO EVIDENCE OF CARCINOMA IN 1 OF 1 LYMPH NODE (0/1). 8. Lymph node, biopsy, Level 10 #1 - THERE IS NO EVIDENCE OF CARCINOMA IN 1 OF 1 LYMPH NODE (0/1). 9. Lymph node, biopsy, Level 4R #1 - THERE IS NO EVIDENCE OF CARCINOMA IN 1 OF 1 LYMPH NODE (0/1). 10. Lymph node, biopsy, Level 4R #2 - THERE IS NO EVIDENCE OF CARCINOMA IN 1 OF 1 LYMPH NODE (0/1). 11. Lymph node, biopsy, Level 2R #1 - THERE IS NO EVIDENCE OF CARCINOMA IN 1 OF 1 LYMPH NODE (0/1). 12. Lung, resection (segmental or lobe), Right Lobe Superior - BENIGN LUNG PARENCHYMA. - THERE IS NO EVIDENCE OF MALIGNANCY. Procedure: Segmentectomy. Specimen Laterality: Right. Tumor Site: Right superior lobe. Tumor Size: 1.6 cm (gross measurement). Tumor Focality: Unifocal. Histologic Type: Adenocarcinoma, moderately differentiated. Visceral Pleura Invasion: Not identified. Lymphovascular Invasion: Not identified. Direct Invasion of Adjacent Structures: Not identified. Margins: Negative for carcinoma. Treatment Effect: N/A Regional Lymph Nodes: Number of Lymph Nodes Involved: 1 (level 7) Number of Lymph Nodes Examined: 10 Pathologic  Stage Classification (pTNM, AJCC 8th Edition): pT1b, pN2 Ancillary Studies: The tumor cells are positive for Napsin-A, TTF-1, and p53. They are essentially negative for estrogen receptor, PAX-8 and progesterone receptor. Additional studies can be performed upon clinician request. Representative Tumor Block: 1D-1E. (JBK:gt, 12/09/18)   12/07/2018 Surgery   PREOPERATIVE DIAGNOSIS:  Right lung nodule.   POSTOPERATIVE DIAGNOSIS:  Non-small cell carcinoma- suspected primary lung carcinoma, clinical stage T2N01b.   PROCEDURE:   Right video-assisted thoracoscopy, Right lower lobe superior segmentectomy with en bloc wedge resection of right upper lobe, Lymph node dissection, Intercostal nerve  block levels 3-9.   SURGEON:  Modesto Charon, MD   FINDINGS:  Mass palpable in posterior aspect of superior segment of right lower lobe, likely involvement across fissure into the upper lobe.  Frozen section revealed non-small cell carcinoma. upper and lower lobe margins clear.   12/23/2018 Cancer Staging   Staging form: Lung, AJCC 8th Edition - Pathologic: Stage IIIA (pT1b, pN2, cM0) - Signed by Heath Lark, MD on 12/23/2018   01/05/2019 Imaging   MRI brain 1. No metastatic disease or acute intracranial abnormality identified. 2. Small chronic parafalcine meningioma size is stable since that described in 2006 (12 x 15 mm). 3. Advanced signal changes in the cerebral white matter and to a lesser extent pons, nonspecific but most commonly due to chronic small vessel disease. 4. Partially visible degenerative cervical spinal stenosis.     01/07/2019 -  Chemotherapy   The patient had carboplatin and [place] for chemotherapy treatment.     04/14/2019 PET scan   1. 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 As shown on recent MRI of 04/01/2019, there is a new component of invasion into the left lower L3 vertebral body. This new component has a maximum SUV of 10.0, compatible with active malignancy. 2. Interval wedge resection of portions of the right upper lobe and right lower lobe at the site of the prior nodule. There is only minimal low-grade activity along the wedge resection clips, maximum SUV of 1.5. Surveillance is likely warranted. No new nodule identified. 3. Other imaging findings of potential clinical significance: Aortic Atherosclerosis (ICD10-I70.0). Coronary atherosclerosis. Thoracolumbar scoliosis. Cholelithiasis. Sigmoid colon diverticulosis.     05/03/2019 -  Chemotherapy   The patient had Pembrolizumab and Lenvima for chemotherapy treatment.     05/22/2019 Imaging   Ct head Atrophy, chronic microvascular disease.   No acute intracranial  abnormality.       REVIEW OF SYSTEMS:   Constitutional: Denies fevers, chills or abnormal weight loss Eyes: Denies blurriness of vision Ears, nose, mouth, throat, and face: Denies mucositis or sore throat Respiratory: Denies cough, dyspnea or wheezes Cardiovascular: Denies palpitation, chest discomfort or lower extremity swelling Gastrointestinal:  Denies nausea, heartburn or change in bowel habits Skin: Denies abnormal skin rashes Lymphatics: Denies new lymphadenopathy or easy bruising Neurological:Denies numbness, tingling or new weaknesses Behavioral/Psych: Mood is stable, no new changes  All other systems were reviewed with the patient and are negative.  I have reviewed the past medical history, past surgical history, social history and family history with the patient and they are unchanged from previous note.  ALLERGIES:  is allergic to ace inhibitors; dilaudid [hydromorphone hcl]; and codeine.  MEDICATIONS:  Current Outpatient Medications  Medication Sig Dispense Refill  . amLODipine (NORVASC) 5 MG tablet Take 1 tablet (5 mg total) by mouth daily. 30 tablet 3  . aspirin EC 81 MG tablet Take 81 mg by mouth daily.    Marland Kitchen  atenolol (TENORMIN) 25 MG tablet Take 25 mg by mouth 2 (two) times daily.     Marland Kitchen lenvatinib 10 mg daily dose (LENVIMA, 10 MG DAILY DOSE,) capsule Take 1 capsule (10 mg total) by mouth daily. 30 capsule 11  . lidocaine-prilocaine (EMLA) cream Apply to [facility] cath 1-2 hours prior to access as directed 30 g 1  . LORazepam (ATIVAN) 0.5 MG tablet 1 tablet po 30 minutes prior to radiation or MRI 30 tablet 0  . Multiple Vitamin (MULTIVITAMIN WITH MINERALS) TABS tablet Take 1 tablet by mouth daily.    . ondansetron (ZOFRAN) 8 MG tablet PLEASE SEE ATTACHED FOR DETAILED DIRECTIONS    . simvastatin (ZOCOR) 20 MG tablet Take 20 mg by mouth every evening.      No current facility-administered medications for this visit.   Facility-Administered Medications Ordered in Other  Visits  Medication Dose Route Frequency Provider Last Rate Last Admin  . heparin lock flush 100 unit/mL  500 Units Intracatheter Once Alvy Bimler, Jammal Sarr, MD      . sodium chloride flush (NS) 0.9 % injection 10 mL  10 mL Intracatheter Once Alvy Bimler, Winnifred Dufford, MD        PHYSICAL EXAMINATION: ECOG PERFORMANCE STATUS: 1 - Symptomatic but completely ambulatory  Vitals:   10/18/19 0900  BP: 139/79  Pulse: 82  Resp: 18  Temp: 98.4 F (36.9 C)  SpO2: 99%   Filed Weights   10/18/19 0900  Weight: 95 lb 9.6 oz (43.4 kg)    GENERAL:alert, no distress and comfortable SKIN: skin color, texture, turgor are normal, no rashes or significant lesions EYES: normal, Conjunctiva are pink and non-injected, sclera clear OROPHARYNX:no exudate, no erythema and lips, buccal mucosa, and tongue normal  NECK: supple, thyroid normal size, non-tender, without nodularity LYMPH:  no palpable lymphadenopathy in the cervical, axillary or inguinal LUNGS: clear to auscultation and percussion with normal breathing effort HEART: regular rate & rhythm and no murmurs and no lower extremity edema ABDOMEN:abdomen soft, non-tender and normal bowel sounds Musculoskeletal:no cyanosis of digits and no clubbing  NEURO: alert & oriented x 3 with fluent speech, no focal motor/sensory deficits  LABORATORY DATA:  I have reviewed the data as listed    Component Value Date/Time   NA 140 09/27/2019 0905   NA 140 05/09/2017 0844   K 3.8 09/27/2019 0905   K 4.1 05/09/2017 0844   CL 108 09/27/2019 0905   CO2 17 (L) 09/27/2019 0905   CO2 26 05/09/2017 0844   GLUCOSE 110 (H) 09/27/2019 0905   GLUCOSE 82 05/09/2017 0844   BUN 33 (H) 09/27/2019 0905   BUN 20.7 05/09/2017 0844   CREATININE 1.20 (H) 09/27/2019 0905   CREATININE 0.7 05/09/2017 0844   CALCIUM 9.9 09/27/2019 0905   CALCIUM 9.3 05/09/2017 0844   PROT 7.3 09/27/2019 0905   PROT 6.8 05/09/2017 0844   ALBUMIN 3.7 09/27/2019 0905   ALBUMIN 3.6 05/09/2017 0844   AST 39  09/27/2019 0905   AST 27 05/09/2017 0844   ALT 35 09/27/2019 0905   ALT 17 05/09/2017 0844   ALKPHOS 58 09/27/2019 0905   ALKPHOS 43 05/09/2017 0844   BILITOT 1.0 09/27/2019 0905   BILITOT 0.81 05/09/2017 0844   GFRNONAA 43 (L) 09/27/2019 0905   GFRAA 50 (L) 09/27/2019 0905    No results found for: SPEP, UPEP  Lab Results  Component Value Date   WBC 6.3 10/18/2019   NEUTROABS 4.2 10/18/2019   HGB 12.2 10/18/2019   HCT 37.2 10/18/2019  MCV 100.0 10/18/2019   PLT 172 10/18/2019      Chemistry      Component Value Date/Time   NA 140 09/27/2019 0905   NA 140 05/09/2017 0844   K 3.8 09/27/2019 0905   K 4.1 05/09/2017 0844   CL 108 09/27/2019 0905   CO2 17 (L) 09/27/2019 0905   CO2 26 05/09/2017 0844   BUN 33 (H) 09/27/2019 0905   BUN 20.7 05/09/2017 0844   CREATININE 1.20 (H) 09/27/2019 0905   CREATININE 0.7 05/09/2017 0844      Component Value Date/Time   CALCIUM 9.9 09/27/2019 0905   CALCIUM 9.3 05/09/2017 0844   ALKPHOS 58 09/27/2019 0905   ALKPHOS 43 05/09/2017 0844   AST 39 09/27/2019 0905   AST 27 05/09/2017 0844   ALT 35 09/27/2019 0905   ALT 17 05/09/2017 0844   BILITOT 1.0 09/27/2019 0905   BILITOT 0.81 05/09/2017 0844

## 2019-10-18 NOTE — Assessment & Plan Note (Signed)
Her last PET scan showed excellent response to treatment She tolerated treatment well without major side effects The plan will be to continue treatment indefinitely I recommend repeat imaging study before I see her next

## 2019-10-18 NOTE — Assessment & Plan Note (Signed)
She has no signs of cancer recurrence I recommend calcium with vitamin D supplement to prevent severe osteoporosis We also discussed strength training exercises

## 2019-10-19 ENCOUNTER — Ambulatory Visit
Admission: RE | Admit: 2019-10-19 | Discharge: 2019-10-19 | Disposition: A | Discharge status: Home / Self Care | Payer: Medicare Other | Source: Ambulatory Visit | Attending: Thoracic Surgery (Cardiothoracic Vascular Surgery) | Admitting: Thoracic Surgery (Cardiothoracic Vascular Surgery)

## 2019-10-19 ENCOUNTER — Ambulatory Visit (INDEPENDENT_AMBULATORY_CARE_PROVIDER_SITE_OTHER): Payer: Medicare Other | Admitting: Thoracic Surgery (Cardiothoracic Vascular Surgery)

## 2019-10-19 VITALS — BP 137/80 | HR 78 | Temp 97.6°F | Resp 20 | Ht 59.0 in | Wt 96.6 lb

## 2019-10-19 DIAGNOSIS — Z09 Encounter for follow-up examination after completed treatment for conditions other than malignant neoplasm: Secondary | ICD-10-CM

## 2019-10-19 DIAGNOSIS — J984 Other disorders of lung: Secondary | ICD-10-CM | POA: Diagnosis not present

## 2019-10-19 DIAGNOSIS — C3490 Malignant neoplasm of unspecified part of unspecified bronchus or lung: Secondary | ICD-10-CM | POA: Diagnosis not present

## 2019-10-19 DIAGNOSIS — C3491 Malignant neoplasm of unspecified part of right bronchus or lung: Secondary | ICD-10-CM

## 2019-10-19 NOTE — Progress Notes (Signed)
[address]       [place] [address]             [phone]     HPI: 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  She was originally found to have a groundglass opacity in the superior segment of the right lower lobe.  That actually crossed the fissure into the inferior aspect of the upper lobe.  The lesion got larger over serial CT scans.  I did a right lower lobe superior segmentectomy with an en bloc portion of the right upper lobe and node dissection on 12/07/2018.  Pathologic stage was 3A (T2, M1).  She did well postoperatively.  She was treated with adjuvant chemotherapy with cisplatin and pemetrexed.  She currently is on immunotherapy with lenvatinib for metastatic endometrial cancer. She had a PET/CT in March that showed an excellent response to treatment.  She denies any pain associated with her chest incision.  She says that she does not even feel like she had surgery there.    Past Medical History:  Diagnosis Date  . Endometrial cancer ([place]) 12/2007   s/p total abdominal hysterectomy at [facility] - ovaries were also removed   . Family history of breast cancer   . History of breast cancer    right breast -- treated with lumpectomy and radiation therarpy, postoperatively with tamoxifen   . History of radiation therapy 10/22/2016 to 10/28/2016  . Hypercholesterolemia   . Hyperlipidemia   . Hypertension   . Palpitations   . Personal history of radiation therapy 2006  . PVC's (premature ventricular contractions)   . Radiation 07/11/14-08/12/14   left lumbar paraspinal area 55 gray  . S/P radiation therapy 10/28/11-12/05/11   5040 cGy left pelvis  . S/P radiation therapy    Intracavitary brachytherapy of uterus    Current Outpatient Medications  Medication Sig Dispense Refill    . amLODipine (NORVASC) 5 MG tablet Take 1 tablet (5 mg total) by mouth daily. 30 tablet 3  . aspirin EC 81 MG tablet Take 81 mg by mouth daily.    Marland Kitchen atenolol (TENORMIN) 25 MG tablet Take 25 mg by mouth 2 (two) times daily.     Marland Kitchen lenvatinib 10 mg daily dose (LENVIMA, 10 MG DAILY DOSE,) capsule Take 1 capsule (10 mg total) by mouth daily. 30 capsule 11  . lidocaine-prilocaine (EMLA) cream Apply to [facility] cath 1-2 hours prior to access as directed 30 g 1  . LORazepam (ATIVAN) 0.5 MG tablet 1 tablet po 30 minutes prior to radiation or MRI 30 tablet 0  . Multiple Vitamin (MULTIVITAMIN WITH MINERALS) TABS tablet Take 1 tablet by mouth daily.    . simvastatin (ZOCOR) 20 MG tablet Take 20 mg by mouth every evening.      No current facility-administered medications for this visit.   Facility-Administered Medications Ordered in Other Visits  Medication Dose Route Frequency Provider Last Rate Last Admin  . heparin lock flush 100 unit/mL  500 Units Intracatheter Once Gorsuch, Ni, MD      . sodium chloride flush (NS) 0.9 % injection 10 mL  10 mL Intracatheter Once Heath Lark, MD        Physical Exam BP 137/80   Pulse 78   Temp 97.6 F (36.4 C) (Skin)   Resp 20  Ht 4\' 11"  (1.499 m)   Wt 96 lb 9.6 oz (43.8 kg)   SpO2 95% Comment: RA  BMI 19.60 kg/m  79 year old woman in no acute distress Alert and oriented x3 with no focal deficits Lungs clear with essentially equal breath sounds bilaterally Incisions well-healed Cardiac regular rate and rhythm  Diagnostic Tests: I personally reviewed her chest x-ray images.  No signs of recurrent disease.  Impression: [name] is a 79 year old woman with a complex medical history with multiple prior malignancies including metastatic endometrial cancer.  I did a right lower lobe superior segmentectomy with en bloc resection of the upper lobe for groundglass opacities it turned out to be a T1, N2, stage IIIa adenocarcinoma in July 2021.  She was treated  with postoperative adjuvant chemotherapy.  She is now about a year out from surgery with no evidence of recurrent disease.  She does not have any residual issues related to her surgery.  She currently is on immunotherapy for metastatic endometrial cancer with lenvatinib.   Plan: Continue to follow-up with Dr. Alvy Bimler I will be happy to see [name] back anytime in the future if I can be of any further assistance with her care  I spent 15 minutes today in review of records, images, and with [name]. Melrose Nakayama, MD [facility] [phone]

## 2019-10-20 ENCOUNTER — Other Ambulatory Visit: Payer: Self-pay

## 2019-10-20 ENCOUNTER — Other Ambulatory Visit (HOSPITAL_COMMUNITY): Payer: Self-pay | Admitting: Internal Medicine

## 2019-10-20 ENCOUNTER — Ambulatory Visit (HOSPITAL_COMMUNITY)
Admission: RE | Admit: 2019-10-20 | Discharge: 2019-10-20 | Disposition: A | Discharge status: Home / Self Care | Payer: Medicare Other | Source: Ambulatory Visit | Attending: Internal Medicine | Admitting: Internal Medicine

## 2019-10-20 DIAGNOSIS — R600 Localized edema: Secondary | ICD-10-CM

## 2019-10-20 DIAGNOSIS — I82401 Acute embolism and thrombosis of unspecified deep veins of right lower extremity: Secondary | ICD-10-CM | POA: Diagnosis not present

## 2019-10-20 DIAGNOSIS — C3431 Malignant neoplasm of lower lobe, right bronchus or lung: Secondary | ICD-10-CM | POA: Diagnosis not present

## 2019-10-20 DIAGNOSIS — N1831 Chronic kidney disease, stage 3a: Secondary | ICD-10-CM | POA: Diagnosis not present

## 2019-10-20 DIAGNOSIS — D6481 Anemia due to antineoplastic chemotherapy: Secondary | ICD-10-CM | POA: Diagnosis not present

## 2019-10-25 DIAGNOSIS — R040 Epistaxis: Secondary | ICD-10-CM | POA: Diagnosis not present

## 2019-10-25 DIAGNOSIS — Z7901 Long term (current) use of anticoagulants: Secondary | ICD-10-CM | POA: Diagnosis not present

## 2019-11-02 ENCOUNTER — Other Ambulatory Visit: Payer: Self-pay | Admitting: Hematology and Oncology

## 2019-11-02 NOTE — Progress Notes (Signed)
Pharmacist Chemotherapy Monitoring - Follow Up Assessment    I verify that I have reviewed each item in the below checklist:  . Regimen for the patient is scheduled for the appropriate day and plan matches scheduled date. Marland Kitchen Appropriate non-routine labs are ordered dependent on drug ordered. . If applicable, additional medications reviewed and ordered per protocol based on lifetime cumulative doses and/or treatment regimen.   Plan for follow-up and/or issues identified: Yes . I-vent associated with next due treatment: Yes . MD and/or nursing notified: Yes   Kennith Center, Pharm.D., CPP 11/02/2019@12 :35 PM

## 2019-11-08 ENCOUNTER — Inpatient Hospital Stay: Payer: Medicare Other

## 2019-11-08 ENCOUNTER — Encounter: Payer: Self-pay | Admitting: Hematology and Oncology

## 2019-11-08 ENCOUNTER — Inpatient Hospital Stay (HOSPITAL_BASED_OUTPATIENT_CLINIC_OR_DEPARTMENT_OTHER): Payer: Medicare Other | Admitting: Hematology and Oncology

## 2019-11-08 ENCOUNTER — Encounter (HOSPITAL_COMMUNITY)
Admission: RE | Admit: 2019-11-08 | Discharge: 2019-11-08 | Disposition: A | Discharge status: Home / Self Care | Payer: Medicare Other | Source: Ambulatory Visit | Attending: Hematology and Oncology | Admitting: Hematology and Oncology

## 2019-11-08 ENCOUNTER — Other Ambulatory Visit: Payer: Self-pay

## 2019-11-08 DIAGNOSIS — Z7189 Other specified counseling: Secondary | ICD-10-CM

## 2019-11-08 DIAGNOSIS — I824Y1 Acute embolism and thrombosis of unspecified deep veins of right proximal lower extremity: Secondary | ICD-10-CM | POA: Diagnosis not present

## 2019-11-08 DIAGNOSIS — R64 Cachexia: Secondary | ICD-10-CM

## 2019-11-08 DIAGNOSIS — C541 Malignant neoplasm of endometrium: Secondary | ICD-10-CM | POA: Diagnosis not present

## 2019-11-08 DIAGNOSIS — I82401 Acute embolism and thrombosis of unspecified deep veins of right lower extremity: Secondary | ICD-10-CM | POA: Insufficient documentation

## 2019-11-08 DIAGNOSIS — C7951 Secondary malignant neoplasm of bone: Secondary | ICD-10-CM

## 2019-11-08 DIAGNOSIS — C3491 Malignant neoplasm of unspecified part of right bronchus or lung: Secondary | ICD-10-CM | POA: Insufficient documentation

## 2019-11-08 DIAGNOSIS — R040 Epistaxis: Secondary | ICD-10-CM

## 2019-11-08 DIAGNOSIS — T451X5A Adverse effect of antineoplastic and immunosuppressive drugs, initial encounter: Secondary | ICD-10-CM

## 2019-11-08 DIAGNOSIS — C549 Malignant neoplasm of corpus uteri, unspecified: Secondary | ICD-10-CM

## 2019-11-08 DIAGNOSIS — Z95828 Presence of other vascular implants and grafts: Secondary | ICD-10-CM

## 2019-11-08 DIAGNOSIS — D701 Agranulocytosis secondary to cancer chemotherapy: Secondary | ICD-10-CM

## 2019-11-08 DIAGNOSIS — Z5112 Encounter for antineoplastic immunotherapy: Secondary | ICD-10-CM | POA: Diagnosis not present

## 2019-11-08 DIAGNOSIS — Z853 Personal history of malignant neoplasm of breast: Secondary | ICD-10-CM

## 2019-11-08 DIAGNOSIS — I10 Essential (primary) hypertension: Secondary | ICD-10-CM

## 2019-11-08 DIAGNOSIS — E039 Hypothyroidism, unspecified: Secondary | ICD-10-CM

## 2019-11-08 LAB — CBC WITH DIFFERENTIAL (CANCER CENTER ONLY)
Abs Immature Granulocytes: 0.01 10*3/uL (ref 0.00–0.07)
Basophils Absolute: 0 10*3/uL (ref 0.0–0.1)
Basophils Relative: 1 %
Eosinophils Absolute: 0.1 10*3/uL (ref 0.0–0.5)
Eosinophils Relative: 1 %
HCT: 35.4 % — ABNORMAL LOW (ref 36.0–46.0)
Hemoglobin: 11.3 g/dL — ABNORMAL LOW (ref 12.0–15.0)
Immature Granulocytes: 0 %
Lymphocytes Relative: 21 %
Lymphs Abs: 1.3 10*3/uL (ref 0.7–4.0)
MCH: 31.9 pg (ref 26.0–34.0)
MCHC: 31.9 g/dL (ref 30.0–36.0)
MCV: 100 fL (ref 80.0–100.0)
Monocytes Absolute: 0.9 10*3/uL (ref 0.1–1.0)
Monocytes Relative: 14 %
Neutro Abs: 4.1 10*3/uL (ref 1.7–7.7)
Neutrophils Relative %: 63 %
Platelet Count: 156 10*3/uL (ref 150–400)
RBC: 3.54 MIL/uL — ABNORMAL LOW (ref 3.87–5.11)
RDW: 14.6 % (ref 11.5–15.5)
WBC Count: 6.4 10*3/uL (ref 4.0–10.5)
nRBC: 0 % (ref 0.0–0.2)

## 2019-11-08 LAB — CMP (CANCER CENTER ONLY)
ALT: 29 U/L (ref 0–44)
AST: 31 U/L (ref 15–41)
Albumin: 3.4 g/dL — ABNORMAL LOW (ref 3.5–5.0)
Alkaline Phosphatase: 52 U/L (ref 38–126)
Anion gap: 14 (ref 5–15)
BUN: 37 mg/dL — ABNORMAL HIGH (ref 8–23)
CO2: 20 mmol/L — ABNORMAL LOW (ref 22–32)
Calcium: 9.6 mg/dL (ref 8.9–10.3)
Chloride: 107 mmol/L (ref 98–111)
Creatinine: 1.15 mg/dL — ABNORMAL HIGH (ref 0.44–1.00)
GFR, Est AFR Am: 52 mL/min — ABNORMAL LOW (ref >60)
GFR, Estimated: 45 mL/min — ABNORMAL LOW (ref >60)
Glucose, Bld: 113 mg/dL — ABNORMAL HIGH (ref 70–99)
Potassium: 4.7 mmol/L (ref 3.5–5.1)
Sodium: 141 mmol/L (ref 135–145)
Total Bilirubin: 0.8 mg/dL (ref 0.3–1.2)
Total Protein: 6.7 g/dL (ref 6.5–8.1)

## 2019-11-08 LAB — GLUCOSE, CAPILLARY: Glucose-Capillary: 95 mg/dL (ref 70–99)

## 2019-11-08 LAB — TOTAL PROTEIN, URINE DIPSTICK

## 2019-11-08 LAB — TSH: TSH: <0.08 u[IU]/mL — ABNORMAL LOW (ref 0.308–3.960)

## 2019-11-08 LAB — T4, FREE: Free T4: 1.27 ng/dL — ABNORMAL HIGH (ref 0.61–1.12)

## 2019-11-08 MED ORDER — FLUDEOXYGLUCOSE F - 18 (FDG) INJECTION: 5.0000 | Freq: Once | INTRAVENOUS | Status: DC | PRN

## 2019-11-08 MED ORDER — HEPARIN SOD (PORK) LOCK FLUSH 100 UNIT/ML IV SOLN
500.0000 [IU] | Freq: Once | INTRAVENOUS | Status: AC | PRN
  Administered 2019-11-08: 500 [IU]
  Filled 2019-11-08: qty 5

## 2019-11-08 MED ORDER — SODIUM CHLORIDE 0.9% FLUSH
10.0000 mL | INTRAVENOUS | Status: DC | PRN
  Administered 2019-11-08: 10 mL
  Filled 2019-11-08: qty 10

## 2019-11-08 MED ORDER — MIRTAZAPINE 7.5 MG PO TABS
7.5000 mg | ORAL_TABLET | Freq: Every day | ORAL | 3 refills | Status: DC
Start: 2019-11-08 — End: 2019-11-30

## 2019-11-08 MED ORDER — SODIUM CHLORIDE 0.9% FLUSH
10.0000 mL | Freq: Once | INTRAVENOUS | Status: AC
  Administered 2019-11-08: 10 mL
  Filled 2019-11-08: qty 10

## 2019-11-08 MED ORDER — SODIUM CHLORIDE 0.9 % IV SOLN
200.0000 mg | Freq: Once | INTRAVENOUS | Status: AC
  Administered 2019-11-08: 200 mg via INTRAVENOUS
  Filled 2019-11-08: qty 8

## 2019-11-08 MED ORDER — SODIUM CHLORIDE 0.9 % IV SOLN
Freq: Once | INTRAVENOUS | Status: AC
  Filled 2019-11-08: qty 250

## 2019-11-08 MED ORDER — RIVAROXABAN 10 MG PO TABS: 10.0000 mg | ORAL_TABLET | Freq: Every day | ORAL | 11 refills | Status: AC

## 2019-11-08 NOTE — Assessment & Plan Note (Signed)
PET CT scan showed improved disease control in her lower back I have alerted radiation oncologist to potentially cancel MRI imaging next month

## 2019-11-08 NOTE — Assessment & Plan Note (Signed)
This is likely due to her anticoagulation therapy We discussed reduced dose Xarelto along with aspirin therapy If she continues to have recurrent epistaxis, we might have to discontinue aspirin therapy

## 2019-11-08 NOTE — Assessment & Plan Note (Signed)
She was started on anticoagulation therapy approximately 3 weeks ago, complicated by recurrent epistaxis I recommend reducing the dose of Xarelto If she continues to have recurrent epistaxis, she might need discontinuation of aspirin therapy

## 2019-11-08 NOTE — Progress Notes (Signed)
[facility] OFFICE PROGRESS NOTE  Patient Care Team: Creola Corn, MD as PCP - General (Internal Medicine) Artis Delay, MD as Consulting Physician (Hematology and Oncology)  ASSESSMENT & PLAN:  Endometrial cancer [facility]) I have reviewed multiple PET CT scan with the patient She has positive response to therapy The plan will be to continue treatment indefinitely I will see her back again in 3 weeks for further follow-up Her next PET CT scan will not be due until the end of the year I have alerted radiation oncologist to potentially cancel MRI scan as her PET CT scan showed improved disease control  Primary lung cancer, right [facility]) She has no signs or symptoms of relapse in her lung We will continue serial imaging study to follow  Deep vein blood clot of right lower extremity (HCC) She was started on anticoagulation therapy approximately 3 weeks ago, complicated by recurrent epistaxis I recommend reducing the dose of Xarelto If she continues to have recurrent epistaxis, she might need discontinuation of aspirin therapy  Metastatic cancer to bone [facility]) PET CT scan showed improved disease control in her lower back I have alerted radiation oncologist to potentially cancel MRI imaging next month  Essential hypertension Her blood pressure is within normal limits Continue close monitoring  Recurrent epistaxis This is likely due to her anticoagulation therapy We discussed reduced dose Xarelto along with aspirin therapy If she continues to have recurrent epistaxis, we might have to discontinue aspirin therapy  Malignant cachexia (HCC) Despite improved disease control, she has malignant cachexia We discussed the risk and benefits of mirtazapine and she is in agreement to try   No orders of the defined types were placed in this encounter.   All questions were answered. The patient knows to call the clinic with any problems, questions or concerns. The total time spent in  the appointment was 30 minutes encounter with patients including review of chart and various tests results, discussions about plan of care and coordination of care plan   Artis Delay, MD 11/08/2019 2:54 PM  INTERVAL HISTORY: Please see below for problem oriented charting. She returns for treatment and follow-up on imaging study She was placed on Xarelto for recent diagnosis of DVT She has been having recurrent nosebleeds recently She have lost some weight due to reduced appetite She denies significant bone pain  SUMMARY OF ONCOLOGIC HISTORY: Oncology History Overview Note  Biopsy of recurrence 06-2014 ER negative. PDL1 testing low at 2% on the uterine cancer She progressed on carboplatin and Paclitaxel for uterine cancer HER 2 negative on pathology [id number] Negative genetic testing  On her lung cancer sample, [employer] testing revealed 10% PD-1 positive EGFR mutation was detected, exon 19 deletion   Endometrial cancer (HCC)  12/01/2007 Initial Diagnosis   She has recurrent papillary serous endometrial carcinoma. Briefly she was diagnosed in 2009 with a FIGO IB pappilary serous carcinoma treated with hysterectomy followed by adjuvant chemo and vaginal brachytherapy. She then had a recurrence diagnosed in late 2012 that was deemed not to be surgically resectable, and was treated with 6 cycles of carbo/taxol followed by 5040 cGy of EBRT to the pelvic mass. She was then followed with serial imaging and was noted in June of this year to have slight increase in the size of the mass, and again in September there was small enlargement of the mass. She had a re-staging PET scan on 03/10/13 that showed FDG activity in the pelvic mass, with no other areas of disease  12/01/2007 Imaging   CT scan of chest, abdomen and pelvis: 1.  Mass like area of decreased attenuation in the central uterus, consistent with the known endometrial carcinoma.  Deep myometrial invasion is suspected.  Pelvic MRI  without and with contrast could be performed for further staging workup if clinically warranted. 2.  No evidence of extrauterine extension or lymphadenopathy. 3.  Sigmoid diverticulosis incidentally noted.   12/15/2007 Surgery   --12/2007 laparoscopic hysterectomy and PLND --05/2008 complted 6 cycles adjuvant carbo/taxol followed by vaginal cuff brachy --03/2011 exploratory lararoscopy, ureterolysis --4/13 completed 6 cycels carbotaxol --8/13 completed EBRT to the left pelvic sidewall    06/10/2008 Imaging   CT scan abdomen and pelvis 1.  Nonspecific mildly prominent inguinal lymph nodes, right greater than left. 2.  Interval hysterectomy without evidence of recurrent pelvic mass or fluid collection.   03/22/2011 Imaging   Ct scan abdomen and pelvis 1.  Local uterine cancer recurrence with two large nodes along the left pelvic sidewall. 2.  No evidence of bowel obstruction, urinary obstruction, or more distant metastasis.   03/31/2011 Relapse/Recurrence   chemotherapy and external beam radiation   05/16/2011 - 09/17/2011 Chemotherapy   She had 6 cycles of carboplatin and Taxol    07/17/2011 Imaging   Ct scan abdomen and pelvis 1.  Interval improvement in the previously demonstrated left pelvic local recurrence of tumor. 2.  No disease progression or complication identified. 3.  Mild bladder wall thickening on the right, nonspecific and possibly related to incomplete distension and/or radiation therapy. 4.  Cholelithiasis   10/11/2011 Imaging   CT scan abdomen and pelvis 1.  Interval enlargement of the left pelvic sidewall masses. 2.  No new nodular disease in the pelvis or lymphadenopathy. 3.  No evidence  of distant metastasis. 4.  Mild hydronephrosis on the right is similar to prior. 5.  Focal thickening of the right aspect the bladder is stable compared to prior.    10/28/2011 - 12/05/2011 Radiation Therapy   radiation for pelvic sidewall recurrence   10/28/2011 - 12/05/2011  Radiation Therapy   10/28/11-12/05/11: Radiotherapy to the left pelvic sidewall   03/18/2012 Imaging   CT abdomen 1.  Today's study demonstrates a positive response to therapy with decreased size of left pelvic sidewall nodal masses, as detailed above.  No new soft tissue masses or new lymphadenopathy is identified within the abdomen or pelvis. Continue attention on follow-up studies is recommended. 2.  Cholelithiasis without findings to suggest acute cholecystitis. 3.  Colonic diverticulosis without findings to suggest acute diverticulitis at this time. 4.  Mild asymmetric urinary bladder wall thickening is unchanged compared to prior examinations and is nonspecific.  No definite bladder wall mass is identified at this time. 5.  Additional findings, similar to prior examinations, as above   06/22/2012 Imaging   CT abdomen 1.  Further decreased size of the left pelvic peripherally enhancing lesion. 2.  No new sites of new or progressive disease. 3. Esophageal air fluid level suggests dysmotility or gastroesophageal reflux. 4.  Cholelithiasis   11/02/2012 Imaging   CT abdomen 1.  The left pelvic side wall lesion demonstrates mild increase in size from previous exam. 2.  No new sites of new or progressive disease. 3.  Cholelithiasis.    01/28/2013 Imaging   CT abdomen 1. Interval small increase in volume of enhancing mass adjacent to the left pelvic sidewall. 2. No evidence of abdominal or pelvic lymphadenopathy   04/02/2013 Surgery   pelvic mass resection with IORT  04/02/2013 - 04/02/2013 Radiation Therapy   04/02/13: Intraoperative radiotherapy to the left pelvis   05/10/2013 PET scan   1. Hypermetabolic mass in the deep left pelvis consistent with metastasis. 2. No additional evidence of local metastasis. No evidence of distant metastasis. 3. Small right lower lobe pulmonary nodule is not hypermetabolic.   07/27/2013 Imaging   No evidence of recurrent or metastatic disease.  Apparent prior resection of the deep left pelvic metastasis.   02/08/2014 Imaging   No CT findings for recurrent or metastatic disease involving the abdomen/ pelvis   06/13/2014 Relapse/Recurrence   + recurrence paraspinous muscle   06/21/2014 Procedure   There is a soft tissue lesion along the left side of L3-L4. This lesion roughly measures up to 2.2 cm. Needle was positioned along the posterior aspect of the lesion. Small amount of air in the paraspinal tissues following needle removal   06/21/2014 Pathology Results   Bone, biopsy, left lumbar paraspinal - METASTATIC POORLY DIFFERENTIATED CARCINOMA, CONSISTENT WITH HIGH GRADE SEROUS CARCINOMA SEE COMMENT. Microscopic Comment The carcinoma demonstrates the following immunophenotype: Cytokeratin 7 - patchy moderate to strong expression Estrogen receptor - negative expression P53 - strong diffuse expression TTF-1 - negative expression WT-1 - negative expression CD56 - focal moderate strong expression Synaptophysin - negative expression GCDFP - negative expression The history of primary endometrial papillary serous carcinoma and primary mammary carcinoma is noted. In the current case, the overall morphologic and immunophenotype are that of poorly differentiated carcinoma, consistent with high grade serous carcinoma.    07/11/2014 - 08/12/2014 Radiation Therapy   07/11/14-08/12/14: 55 Gy in 25 fractions to the Lumbar spine   08/15/2014 Imaging   CT scan of chest, abdomen and pelvis 1. Since the biopsy study of 06/21/2014, similar to slight decrease in size of a left paraspinous lesion at L3-4. 2. No new sites of metastatic disease identified. 3. 8 mm ground-glass nodule in the right lower lobe is grossly similar to 03/10/2013, suggesting a benign etiology. 4. Cholelithiasis. 5. Apparent sigmoid colonic wall thickening which could be due to underdistention. Colitis felt less likely. 6.  Atherosclerosis, including within the coronary arteries. 7.  Pelvic floor laxity.   10/07/2014 Imaging   CT scan of chest, abdomen and pelvis: Stable to slight interval decrease in size of left paraspinous lesion at L3-4. No new sites of metastatic disease. Unchanged ground-glass nodule within the right lower lobe, potentially benign in etiology. Recommend attention on followup. Cholelithiasis. Sigmoid colonic diverticulosis. No CT evidence to suggest acute diverticulitis.   01/19/2015 Imaging   Ct scan of abdomen and pelvis 1. Decrease in size of left paraspinous metastasis. 2. No new sites of disease. 3. Stable ground-glass attenuating nodule within the superior segment of right lower lobe. 4. Gallstones   05/02/2015 Imaging   Ct abdomen and pelvis: Slight interval increase in size of left lumbar paraspinal soft tissue metastasis. No new sites of metastatic disease identified within the abdomen or pelvis.    05/11/2015 PET scan   1. The small soft tissue mass just lateral to the left L3 for neural foramen is hypermetabolic, favoring malignancy. 2. There is also a small focus of hypermetabolic activity between the left L1 and L2 transverse processes, but without a CT correlate. 3. The 13 mm sub solid nodule in the superior segment right lower lobe is stable from recent exams but has slowly increased in size over the last 7 years. Although not hypermetabolic, the appearance is concerning for the possibility of a low grade adenocarcinoma.  4. Other imaging findings of potential clinical significance: Chronic bilateral maxillary sinusitis. Coronary, aortic arch, and branch vessel atherosclerotic vascular disease. Aortoiliac atherosclerotic vascular disease. Cholelithiasis. Sigmoid colon diverticulosis. Lumbar scoliosis.   07/11/2015 Imaging   Ct scan of chest, abdomen and pelvis: 1. 13 mm mixed solid and sub solid nodule in the posterior right lower lobe was present in 2009 and has clearly progressed in the interval since that study. Imaging features remain  highly concerning for low-grade or well differentiated adenocarcinoma. 2. Slight increase size of in the hypermetabolic, rim enhancing nodule identified adjacent to the left L3-4 neural foramen. Metastatic disease remains a concern. 3. No evidence for discrete soft tissue lesion between the left L1 and 2 transverse processes, the site of focal FDG uptake on the recent PET-CT. 4. Cholelithiasis. 5. Abdominal aortic atherosclerosis   08/03/2015 - 01/16/2016 Chemotherapy   She received carboplatin and Taxol   11/06/2015 PET scan   1. Hypermetabolic left L3-4 paraspinous metastasis (biopsy-proven) is stable in size and slightly decreased in metabolism. 2. Previously described small focus of hypermetabolism between the left L2 and L3 spinous processes has resolved. 3. New mild linear hypermetabolism to the left of the T11-12 spinous processes without discrete mass on the CT images, favor benign activity related activity, recommend attention on follow-up PET-CT.  4. No definite new sites of hypermetabolic metastatic disease. No recurrent hypermetabolic metastatic disease in the pelvis. 5. Interval stability of subsolid 1.3 cm superior segment right lower lobe pulmonary nodule without associated significant metabolism, which has grown compared to the 2009 chest CT study, and remain suspicious for low grade adenocarcinoma. 6. Additional findings include aortic atherosclerosis, coronary atherosclerosis, mild multinodular goiter with no hypermetabolic thyroid nodules, cholelithiasis and moderate sigmoid diverticulosis.   02/12/2016 PET scan   1. Interval increase in size and metabolic activity of the LEFT paraspinal soft tissue metastasis. 2. New activity within the musculature of the LEFT chest wall. Given the unusual location of the paraspinal metastasis cannot exclude a second soft tissue metastasis to the musculature however favor benign physiologic activity. 3. Stable RIGHT lower lobe pulmonary nodule. 4.  No evidence of local recurrence.     04/08/2016 Imaging   Ct scan of chest, abdomen and pelvis: 1. Similar size of a  left paravertebral abdominal lesion. 2. No new or progressive metastatic disease. 3. No correlate for the muscular activity about the lateral left chest wall. 4.  Coronary artery atherosclerosis. Aortic atherosclerosis. 5. Cholelithiasis. 6. Similar right lower lobe pulmonary nodule.   06/03/2016 Imaging   CT abdomen and pelvis 1. Similar to mild enlargement of a paravertebral soft tissue lesion at the L3-4 level. 2. No new sites of disease identified. 3. Cholelithiasis. 4. Hysterectomy. 5.  Aortic atherosclerosis.    09/02/2016 Imaging   CT abdomen and pelvis 1. Continue mild interval increase in size of LEFT paraspinal mass (1-2 mm). 2. No evidence of new metastatic disease in the abdomen pelvis. 3. Post hysterectomy anatomy   09/26/2016 Imaging   MR lumbar spine 1. Left paraspinal metastasis with epicenter adjacent to the left L3 neural foramen does extend into the foramen to the level of the left lateral epidural space (series 9, image 31), but also tracks cephalad and caudal along the L2-L4 lumbar plexus (series 10, image 15). No extension into the left L2 or L4 neural foramina. And no more distal extension of tumor. 2. Abnormal signal in the medial left psoas and ventral left erector spinae muscles at L2 and L3 is probably  denervation related. 3. No bone invasion or osseous metastatic disease. Suspect previous radiation of the L2 through L5 spinal levels. 4. No dural or intradural metastatic disease.    10/22/2016 Procedure   She underwent stereotactic Radiosurgery   10/30/2016 Genetic Testing   Patient has genetic testing done for Inheritable genetic mutation panel. Results revealed patient has no actionable mutation.   01/21/2017 Imaging   1. Reduced size and conspicuity of the left paraspinal mass at the L3-4 level. The mass still present but has  enhancement similar to that of adjacent psoas musculature. 2. No new metastatic disease is identified. 3. Other imaging findings of potential clinical significance: Cholelithiasis. Aortic Atherosclerosis (ICD10-I70.0). Sigmoid diverticulosis. Lumbar scoliosis.   05/09/2017 Imaging   Ct scan of abdomen and pelvis 1. Paraspinal mass adjacent to the left side of L3-L4 is less distinct than prior examinations, and is therefore difficult to discretely measure, however, the overall appearance suggests continued positive response to therapy. 2. No new signs of metastatic disease elsewhere in the abdomen or pelvis. 3. Aortic atherosclerosis, in addition to at least right coronary artery disease. Assessment for potential risk factor modification, dietary therapy or pharmacologic therapy may be warranted, if clinically indicated. 4. Colonic diverticulosis without evidence of acute diverticulitis at this time. 5. Additional incidental findings, as above. Aortic Atherosclerosis (ICD10-I70.0).   08/14/2017 Imaging   1. Treated left paraspinal mass centered at L3-4. Mass size is stable but there has been some evolution of tumor characteristics with less central fluid seen today. Recommend continued surveillance. 2. No evidence of untreated metastasis. 3. Degenerative changes and related impingement are described above.   10/28/2017 Imaging   Stable small left paraspinal soft tissue mass. No new or progressive disease within the abdomen or pelvis.  Cholelithiasis.  No radiographic evidence of cholecystitis.  Colonic diverticulosis, without radiographic evidence of diverticulitis.   04/30/2018 Imaging   Stable post treatment change of the partially necrotic LEFT paravertebral mass at L3-L4. 18 x 21 mm cross-section. Mass effect on the LEFT L3 nerve root redemonstrated. Enhancing and atrophic LEFT psoas is inseparable.   10/15/2018 Imaging   1. Stable post treatment size and appearance of partially necrotic  left paravertebral mass at L3-4, measuring 19 x 25 mm on current exam (unchanged when measured at similar level on previous study). Mass effect on the adjacent left L3 nerve root is unchanged. Adjacent enhancing and atrophic left psoas muscle. 2. Left convex scoliosis with associated multilevel degenerative spondylolysis and facet arthrosis, unchanged   10/29/2018 Imaging   Stable small left L3 paraspinal soft tissue mass. No evidence of new or progressive metastatic disease, or other acute findings.   Cholelithiasis.  No radiographic evidence of cholecystitis.   Colonic diverticulosis, without radiographic evidence of diverticulitis.   11/11/2018 PET scan   1. Low level metabolism (max SUV 2.0) associated with the irregular subsolid superior segment right lower lobe 2.1 cm pulmonary nodule. As better depicted on the recent diagnostic chest CT study, this nodule crosses the major fissure into the right upper lobe and has increased in size and density on multiple imaging studies back to 2009. Findings are most suggestive of primary bronchogenic adenocarcinoma. 2. No hypermetabolic thoracic adenopathy. 3. Persistent hypermetabolism (max SUV 8.0) associated with the known left L3-4 paraspinous metastasis, mildly decreased in metabolism since 02/12/2016 PET-CT. 4. No additional sites of hypermetabolic metastatic disease in the abdomen or pelvis. No metabolic evidence of peritoneal recurrence. No ascites. 5. Chronic findings include: Aortic Atherosclerosis (ICD10-I70.0). Coronary atherosclerosis. Chronic bilateral  maxillary sinusitis. Cholelithiasis. Moderate sigmoid diverticulosis.   01/07/2019 - 04/04/2019 Chemotherapy   The patient had carboplatin and Alimta for chemotherapy treatment.     05/03/2019 -  Chemotherapy   The patient had Pembrolizumab and Lenvima for chemotherapy treatment.     07/23/2019 PET scan   1. Interval decrease in the hypermetabolism associated with the paraspinal tumor at  the L3-4 level. No new sites of hypermetabolic metastatic disease on today's study. 2. Stable appearance of surgical changes in the right lung without features to suggest local recurrence or metastatic lung cancer. 3. Cholelithiasis. 4.  Aortic Atherosclerois (ICD10-170.0)   10/20/2019 Imaging   Bilateral venous Doppler US RIGHT:  - Findings consistent with acute and occlusive deep vein thrombosis involving the right common femoral vein, right external iliac vein, right femoral vein, right popliteal vein, right posterior tibial veins, and right peroneal veins.   Unable to adequately visualize the common iliac veins due to bowel gas. The imaged portions of the IVC were patent.     LEFT:  - No evidence of common femoral vein obstruction.    11/08/2019 PET scan   1. Further reduction in activity at the left L3 level and left paraspinal tissues at L3. Maximum vertebral SUV currently 3.9, previously 4.8 on 07/23/2019 and previously 10.0 on 04/14/2019. 2. Late phase healing of previous left transverse process fractures at L3 and L4. Suspected healing right sixth rib fracture anteriorly. 3. There is new focal activity in the vicinity of the right internal jugular vein in the lower neck. Maximum SUV in this vicinity is 5.1. I not see a well-defined lymph node are thyroid lesion to corroborate with the focus, but surveillance in this region is suggested. 4. Other imaging findings of potential clinical significance: Aortic Atherosclerosis (ICD10-I70.0). Coronary atherosclerosis. Cholelithiasis. Sigmoid colon diverticulosis.   Metastatic cancer to bone (HCC)  06/27/2014 Initial Diagnosis   Metastatic cancer to bone   Primary lung cancer, right (HCC)  11/04/2018 Initial Diagnosis   Primary lung cancer, right (HCC)   11/11/2018 PET scan   1. Low level metabolism (max SUV 2.0) associated with the irregular subsolid superior segment right lower lobe 2.1 cm pulmonary nodule. As better depicted on the recent  diagnostic chest CT study, this nodule crosses the major fissure into the right upper lobe and has increased in size and density on multiple imaging studies back to 2009. Findings are most suggestive of primary bronchogenic adenocarcinoma. 2. No hypermetabolic thoracic adenopathy. 3. Persistent hypermetabolism (max SUV 8.0) associated with the known left L3-4 paraspinous metastasis, mildly decreased in metabolism since 02/12/2016 PET-CT. 4. No additional sites of hypermetabolic metastatic disease in the abdomen or pelvis. No metabolic evidence of peritoneal recurrence. No ascites. 5. Chronic findings include: Aortic Atherosclerosis (ICD10-I70.0). Coronary atherosclerosis. Chronic bilateral maxillary sinusitis. Cholelithiasis. Moderate sigmoid diverticulosis.   12/07/2018 Pathology Results   1. Lung, resection (segmental or lobe), Right Lobe Superior with endblock wedge of right upper lobe - INVASIVE ADENOCARCINOMA, MODERATELY DIFFERENTIATED, SPANNING 1.6 CM. - THE SURGICAL RESECTION MARGINS ARE NEGATIVE FOR CARCINOMA. - SEE ONCOLOGY TABLE BELOW. 2. Lymph node, biopsy, Level 9 #1 - THERE IS NO EVIDENCE OF CARCINOMA IN 1 OF 1 LYMPH NODE (0/1). 3. Lymph node, biopsy, Level 9 #2 - THERE IS NO EVIDENCE OF CARCINOMA IN 1 OF 1 LYMPH NODE (0/1). 4. Lymph node, biopsy, Level 7 #1 - METASTATIC CARCINOMA IN 1 OF 1 LYMPH NODE (1/1). 5. Lymph node, biopsy, Level 7 #2 - THERE IS NO EVIDENCE OF CARCINOMA IN  1 OF 1 LYMPH NODE (0/1). 6. Lymph node, biopsy, Level 12 #1 - THERE IS NO EVIDENCE OF CARCINOMA IN 1 OF 1 LYMPH NODE (0/1). 7. Lymph node, biopsy, Level 12 #2 - THERE IS NO EVIDENCE OF CARCINOMA IN 1 OF 1 LYMPH NODE (0/1). 8. Lymph node, biopsy, Level 10 #1 - THERE IS NO EVIDENCE OF CARCINOMA IN 1 OF 1 LYMPH NODE (0/1). 9. Lymph node, biopsy, Level 4R #1 - THERE IS NO EVIDENCE OF CARCINOMA IN 1 OF 1 LYMPH NODE (0/1). 10. Lymph node, biopsy, Level 4R #2 - THERE IS NO EVIDENCE OF CARCINOMA IN 1 OF 1  LYMPH NODE (0/1). 11. Lymph node, biopsy, Level 2R #1 - THERE IS NO EVIDENCE OF CARCINOMA IN 1 OF 1 LYMPH NODE (0/1). 12. Lung, resection (segmental or lobe), Right Lobe Superior - BENIGN LUNG PARENCHYMA. - THERE IS NO EVIDENCE OF MALIGNANCY. Procedure: Segmentectomy. Specimen Laterality: Right. Tumor Site: Right superior lobe. Tumor Size: 1.6 cm (gross measurement). Tumor Focality: Unifocal. Histologic Type: Adenocarcinoma, moderately differentiated. Visceral Pleura Invasion: Not identified. Lymphovascular Invasion: Not identified. Direct Invasion of Adjacent Structures: Not identified. Margins: Negative for carcinoma. Treatment Effect: N/A Regional Lymph Nodes: Number of Lymph Nodes Involved: 1 (level 7) Number of Lymph Nodes Examined: 10 Pathologic Stage Classification (pTNM, AJCC 8th Edition): pT1b, pN2 Ancillary Studies: The tumor cells are positive for Napsin-A, TTF-1, and p53. They are essentially negative for estrogen receptor, PAX-8 and progesterone receptor. Additional studies can be performed upon clinician request. Representative Tumor Block: 1D-1E. (JBK:gt, 12/09/18)   12/07/2018 Surgery   PREOPERATIVE DIAGNOSIS:  Right lung nodule.   POSTOPERATIVE DIAGNOSIS:  Non-small cell carcinoma- suspected primary lung carcinoma, clinical stage T2N01b.   PROCEDURE:   Right video-assisted thoracoscopy, Right lower lobe superior segmentectomy with en bloc wedge resection of right upper lobe, Lymph node dissection, Intercostal nerve block levels 3-9.   SURGEON:  Charlett Lango, MD   FINDINGS:  Mass palpable in posterior aspect of superior segment of right lower lobe, likely involvement across fissure into the upper lobe.  Frozen section revealed non-small cell carcinoma. upper and lower lobe margins clear.   12/23/2018 Cancer Staging   Staging form: Lung, AJCC 8th Edition - Pathologic: Stage IIIA (pT1b, pN2, cM0) - Signed by Artis Delay, MD on 12/23/2018   01/05/2019 Imaging    MRI brain 1. No metastatic disease or acute intracranial abnormality identified. 2. Small chronic parafalcine meningioma size is stable since that described in 2006 (12 x 15 mm). 3. Advanced signal changes in the cerebral white matter and to a lesser extent pons, nonspecific but most commonly due to chronic small vessel disease. 4. Partially visible degenerative cervical spinal stenosis.     01/07/2019 -  Chemotherapy   The patient had carboplatin and Alimta for chemotherapy treatment.     04/14/2019 PET scan   1. 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 As shown on recent MRI of 04/01/2019, there is a new component of invasion into the left lower L3 vertebral body. This new component has a maximum SUV of 10.0, compatible with active malignancy. 2. Interval wedge resection of portions of the right upper lobe and right lower lobe at the site of the prior nodule. There is only minimal low-grade activity along the wedge resection clips, maximum SUV of 1.5. Surveillance is likely warranted. No new nodule identified. 3. Other imaging findings of potential clinical significance: Aortic Atherosclerosis (ICD10-I70.0). Coronary atherosclerosis. Thoracolumbar scoliosis. Cholelithiasis. Sigmoid colon diverticulosis.  05/03/2019 -  Chemotherapy   The patient had Pembrolizumab and Lenvima for chemotherapy treatment.     05/22/2019 Imaging   Ct head Atrophy, chronic microvascular disease.   No acute intracranial abnormality.       REVIEW OF SYSTEMS:   Constitutional: Denies fevers, chills or abnormal weight loss Eyes: Denies blurriness of vision Ears, nose, mouth, throat, and face: Denies mucositis or sore throat Respiratory: Denies cough, dyspnea or wheezes Cardiovascular: Denies palpitation, chest discomfort or lower extremity swelling Gastrointestinal:  Denies nausea, heartburn or change in bowel habits Skin: Denies abnormal skin rashes Lymphatics: Denies new  lymphadenopathy or easy bruising Neurological:Denies numbness, tingling or new weaknesses Behavioral/Psych: Mood is stable, no new changes  All other systems were reviewed with the patient and are negative.  I have reviewed the past medical history, past surgical history, social history and family history with the patient and they are unchanged from previous note.  ALLERGIES:  is allergic to ace inhibitors, dilaudid [hydromorphone hcl], and codeine.  MEDICATIONS:  Current Outpatient Medications  Medication Sig Dispense Refill  . rivaroxaban (XARELTO) 10 MG TABS tablet Take 1 tablet (10 mg total) by mouth daily. 30 tablet 11  . amLODipine (NORVASC) 5 MG tablet Take 1 tablet (5 mg total) by mouth daily. 30 tablet 3  . aspirin EC 81 MG tablet Take 81 mg by mouth daily.    Marland Kitchen atenolol (TENORMIN) 25 MG tablet Take 25 mg by mouth 2 (two) times daily.     Marland Kitchen lenvatinib 10 mg daily dose (LENVIMA, 10 MG DAILY DOSE,) capsule Take 1 capsule (10 mg total) by mouth daily. 30 capsule 11  . lidocaine-prilocaine (EMLA) cream Apply to [facility] cath 1-2 hours prior to access as directed 30 g 1  . LORazepam (ATIVAN) 0.5 MG tablet 1 tablet po 30 minutes prior to radiation or MRI 30 tablet 0  . mirtazapine (REMERON) 7.5 MG tablet Take 1 tablet (7.5 mg total) by mouth at bedtime. 30 tablet 3  . Multiple Vitamin (MULTIVITAMIN WITH MINERALS) TABS tablet Take 1 tablet by mouth daily.    . simvastatin (ZOCOR) 20 MG tablet Take 20 mg by mouth every evening.      No current facility-administered medications for this visit.   Facility-Administered Medications Ordered in Other Visits  Medication Dose Route Frequency Provider Last Rate Last Admin  . fludeoxyglucose F - 18 (FDG) injection 5 millicurie  5 millicurie Intravenous Once PRN Elie Goody, MD      . heparin lock flush 100 unit/mL  500 Units Intracatheter Once Bertis Ruddy, Lacharles Altschuler, MD      . sodium chloride flush (NS) 0.9 % injection 10 mL  10 mL Intracatheter Once Bertis Ruddy,  Ariellah Faust, MD      . sodium chloride flush (NS) 0.9 % injection 10 mL  10 mL Intracatheter PRN Bertis Ruddy, Hamad Whyte, MD   10 mL at 11/08/19 1405    PHYSICAL EXAMINATION: ECOG PERFORMANCE STATUS: 1 - Symptomatic but completely ambulatory  Vitals:   11/08/19 1205  BP: 132/69  Pulse: 80  Resp: 18  Temp: 97.8 F (36.6 C)  SpO2: 98%   Filed Weights   11/08/19 1205  Weight: 93 lb (42.2 kg)    GENERAL:alert, no distress and comfortable NEURO: alert & oriented x 3 with fluent speech, no focal motor/sensory deficits  LABORATORY DATA:  I have reviewed the data as listed    Component Value Date/Time   NA 141 11/08/2019 1150   NA 140 05/09/2017 0844   K 4.7 11/08/2019  1150   K 4.1 05/09/2017 0844   CL 107 11/08/2019 1150   CO2 20 (L) 11/08/2019 1150   CO2 26 05/09/2017 0844   GLUCOSE 113 (H) 11/08/2019 1150   GLUCOSE 82 05/09/2017 0844   BUN 37 (H) 11/08/2019 1150   BUN 20.7 05/09/2017 0844   CREATININE 1.15 (H) 11/08/2019 1150   CREATININE 0.7 05/09/2017 0844   CALCIUM 9.6 11/08/2019 1150   CALCIUM 9.3 05/09/2017 0844   PROT 6.7 11/08/2019 1150   PROT 6.8 05/09/2017 0844   ALBUMIN 3.4 (L) 11/08/2019 1150   ALBUMIN 3.6 05/09/2017 0844   AST 31 11/08/2019 1150   AST 27 05/09/2017 0844   ALT 29 11/08/2019 1150   ALT 17 05/09/2017 0844   ALKPHOS 52 11/08/2019 1150   ALKPHOS 43 05/09/2017 0844   BILITOT 0.8 11/08/2019 1150   BILITOT 0.81 05/09/2017 0844   GFRNONAA 45 (L) 11/08/2019 1150   GFRAA 52 (L) 11/08/2019 1150    No results found for: SPEP, UPEP  Lab Results  Component Value Date   WBC 6.4 11/08/2019   NEUTROABS 4.1 11/08/2019   HGB 11.3 (L) 11/08/2019   HCT 35.4 (L) 11/08/2019   MCV 100.0 11/08/2019   PLT 156 11/08/2019      Chemistry      Component Value Date/Time   NA 141 11/08/2019 1150   NA 140 05/09/2017 0844   K 4.7 11/08/2019 1150   K 4.1 05/09/2017 0844   CL 107 11/08/2019 1150   CO2 20 (L) 11/08/2019 1150   CO2 26 05/09/2017 0844   BUN 37 (H)  11/08/2019 1150   BUN 20.7 05/09/2017 0844   CREATININE 1.15 (H) 11/08/2019 1150   CREATININE 0.7 05/09/2017 0844      Component Value Date/Time   CALCIUM 9.6 11/08/2019 1150   CALCIUM 9.3 05/09/2017 0844   ALKPHOS 52 11/08/2019 1150   ALKPHOS 43 05/09/2017 0844   AST 31 11/08/2019 1150   AST 27 05/09/2017 0844   ALT 29 11/08/2019 1150   ALT 17 05/09/2017 0844   BILITOT 0.8 11/08/2019 1150   BILITOT 0.81 05/09/2017 0844       RADIOGRAPHIC STUDIES: I have reviewed multiple imaging studies with the patient I have personally reviewed the radiological images as listed and agreed with the findings in the report. DG Chest 2 View  Result Date: 10/19/2019 CLINICAL DATA:  Status post VATS procedure for lung carcinoma. EXAM: CHEST - 2 VIEW COMPARISON:  March 23, 2019; PET-CT July 23, 2019 FINDINGS: There is slight scarring on the right. No edema or airspace opacity. No pulmonary nodular lesions appreciable by radiography. Heart size and pulmonary vascularity are normal. There are surgical clips in the right axilla. Port-A-Cath tip is in the superior vena cava. No pneumothorax. No bone lesions. IMPRESSION: No neoplastic focus evident. No edema or airspace opacity. Mild scarring on the right. Cardiac silhouette normal. No adenopathy. Clips in right axilla. Port-A-Cath tip in superior vena cava. Electronically Signed   By: Bretta Bang III M.D.   On: 10/19/2019 14:59   NM PET Image Restag (PS) Skull Base To Thigh  Result Date: 11/08/2019 CLINICAL DATA:  Subsequent treatment strategy for non-small cell lung cancer. EXAM: NUCLEAR MEDICINE PET SKULL BASE TO THIGH TECHNIQUE: 5.0 mCi F-18 FDG was injected intravenously. Full-ring PET imaging was performed from the skull base to thigh after the radiotracer. CT data was obtained and used for attenuation correction and anatomic localization. Fasting blood glucose: 95 mg/dl COMPARISON:  Multiple exams,  including PET-CT 07/23/2019 FINDINGS: Mediastinal  blood pool activity: SUV max 2.2 Liver activity: SUV max N/A NECK: Physiologic muscular activity in the neck. Activity in the vicinity of the right internal jugular vein observed with maximum SUV 5.1, but I do not see a well-defined lymph node or thyroid lesion to corroborate with this small focus of activity on the CT data. Incidental CT findings: Bilateral common carotid atherosclerotic calcification. CHEST: Physiologic muscular activity asymmetric to the left. Incidental CT findings: Coronary, aortic arch, and branch vessel atherosclerotic vascular disease. Postoperative findings in the right lung. Scarring in the lingula. Right Port-A-Cath tip: SVC. ABDOMEN/PELVIS: Physiologic activity in bowel. Incidental CT findings: Cholelithiasis. Aortoiliac atherosclerotic vascular disease. Sigmoid diverticulosis. SKELETON: Activity eccentric to the left at the L3 level has maximum SUV of 3.9, formerly 4.8. There is previously a notable left paravertebral component along the posterior margin of the left psoas with maximum SUV of 7.9 on to 04/14/2019 and 2.7 on 07/23/2019, currently 1.4. Faint sclerosis anteriorly in right sixth rib without significant hypermetabolic activity, probably a healing fracture. Incidental CT findings: Considerable levoconvex thoracolumbar scoliosis with rotary component. Previous left transverse process fractures at L3 and L4. IMPRESSION: 1. Further reduction in activity at the left L3 level and left paraspinal tissues at L3. Maximum vertebral SUV currently 3.9, previously 4.8 on 07/23/2019 and previously 10.0 on 04/14/2019. 2. Late phase healing of previous left transverse process fractures at L3 and L4. Suspected healing right sixth rib fracture anteriorly. 3. There is new focal activity in the vicinity of the right internal jugular vein in the lower neck. Maximum SUV in this vicinity is 5.1. I not see a well-defined lymph node are thyroid lesion to corroborate with the focus, but surveillance  in this region is suggested. 4. Other imaging findings of potential clinical significance: Aortic Atherosclerosis (ICD10-I70.0). Coronary atherosclerosis. Cholelithiasis. Sigmoid colon diverticulosis. Electronically Signed   By: Gaylyn Rong M.D.   On: 11/08/2019 11:39   VAS [place] LOWER EXTREMITY VENOUS (DVT)  Result Date: 10/20/2019  Lower Venous DVTStudy Indications: Edema involving the right lower extremity for 3 weeks.  Risk Factors: None identified. Performing Technologist: Dorthula Matas RVS, RCS  Examination Guidelines: A complete evaluation includes B-mode imaging, spectral Doppler, color Doppler, and power Doppler as needed of all accessible portions of each vessel. Bilateral testing is considered an integral part of a complete examination. Limited examinations for reoccurring indications may be performed as noted. The reflux portion of the exam is performed with the patient in reverse Trendelenburg.  +---------+---------------+---------+-----------+---------------+-------------+ RIGHT    CompressibilityPhasicitySpontaneityProperties     Thrombus                                                                 Aging         +---------+---------------+---------+-----------+---------------+-------------+ CFV      None           No                  softly  echogenic                    +---------+---------------+---------+-----------+---------------+-------------+ SFJ      Full                                                            +---------+---------------+---------+-----------+---------------+-------------+ FV Prox  None           No                  softly                                                                   echogenic                    +---------+---------------+---------+-----------+---------------+-------------+ FV Mid   None           No                  softly                                                                    echogenic                    +---------+---------------+---------+-----------+---------------+-------------+ FV DistalNone           No                  softly                                                                   echogenic                    +---------+---------------+---------+-----------+---------------+-------------+ POP      None           No                  brightly                                                                 echogenic                    +---------+---------------+---------+-----------+---------------+-------------+ PTV      Partial                                                         +---------+---------------+---------+-----------+---------------+-------------+  PERO     None                                                            +---------+---------------+---------+-----------+---------------+-------------+ GSV      Full                                                            +---------+---------------+---------+-----------+---------------+-------------+    Findings reported to [place] at 12:23 pm.  Summary: RIGHT: - Findings consistent with acute and occlusive deep vein thrombosis involving the right common femoral vein, right external iliac vein, right femoral vein, right popliteal vein, right posterior tibial veins, and right peroneal veins. Unable to adequately visualize the common iliac veins due to bowel gas. The imaged portions of the IVC were patent.  LEFT: - No evidence of common femoral vein obstruction.  *See table(s) above for measurements and observations. Electronically signed by Waverly Ferrari MD on 10/20/2019 at 12:42:33 PM.    Final

## 2019-11-08 NOTE — Assessment & Plan Note (Signed)
I have reviewed multiple PET CT scan with the patient She has positive response to therapy The plan will be to continue treatment indefinitely I will see her back again in 3 weeks for further follow-up Her next PET CT scan will not be due until the end of the year I have alerted radiation oncologist to potentially cancel MRI scan as her PET CT scan showed improved disease control

## 2019-11-08 NOTE — Assessment & Plan Note (Signed)
Despite improved disease control, she has malignant cachexia We discussed the risk and benefits of mirtazapine and she is in agreement to try

## 2019-11-08 NOTE — Assessment & Plan Note (Signed)
Her blood pressure is within normal limits Continue close monitoring

## 2019-11-08 NOTE — Assessment & Plan Note (Signed)
She has no signs or symptoms of relapse in her lung We will continue serial imaging study to follow

## 2019-11-08 NOTE — Patient Instructions (Signed)
[facility] Discharge Instructions for Patients Receiving Chemotherapy  Today you received the following chemotherapy agents :  Keytruda.  To help prevent nausea and vomiting after your treatment, we encourage you to take your nausea medication as prescribed.   If you develop nausea and vomiting that is not controlled by your nausea medication, call the clinic.   BELOW ARE SYMPTOMS THAT SHOULD BE REPORTED IMMEDIATELY:  *FEVER GREATER THAN 100.5 F  *CHILLS WITH OR WITHOUT FEVER  NAUSEA AND VOMITING THAT IS NOT CONTROLLED WITH YOUR NAUSEA MEDICATION  *UNUSUAL SHORTNESS OF BREATH  *UNUSUAL BRUISING OR BLEEDING  TENDERNESS IN MOUTH AND THROAT WITH OR WITHOUT PRESENCE OF ULCERS  *URINARY PROBLEMS  *BOWEL PROBLEMS  UNUSUAL RASH Items with * indicate a potential emergency and should be followed up as soon as possible.  Feel free to call the clinic should you have any questions or concerns. The clinic phone number is [phone].  Please show the CHEMO ALERT CARD at check-in to the Emergency Department and triage nurse.  

## 2019-11-09 ENCOUNTER — Telehealth: Payer: Self-pay | Admitting: *Deleted

## 2019-11-09 NOTE — Telephone Encounter (Signed)
Pt called with questions about recently prescribed px Mirtazapine. Wanting to seek other alternate med due to cost of medication. Advised pt that I will Let Dr.Gorsuch know and will get back with her. Pt verbalized understanding

## 2019-11-10 DIAGNOSIS — L84 Corns and callosities: Secondary | ICD-10-CM | POA: Diagnosis not present

## 2019-11-10 DIAGNOSIS — M7741 Metatarsalgia, right foot: Secondary | ICD-10-CM | POA: Diagnosis not present

## 2019-11-10 DIAGNOSIS — M79672 Pain in left foot: Secondary | ICD-10-CM | POA: Diagnosis not present

## 2019-11-10 DIAGNOSIS — M7742 Metatarsalgia, left foot: Secondary | ICD-10-CM | POA: Diagnosis not present

## 2019-11-10 DIAGNOSIS — M21619 Bunion of unspecified foot: Secondary | ICD-10-CM | POA: Diagnosis not present

## 2019-11-10 DIAGNOSIS — M205X9 Other deformities of toe(s) (acquired), unspecified foot: Secondary | ICD-10-CM | POA: Diagnosis not present

## 2019-11-10 DIAGNOSIS — M79671 Pain in right foot: Secondary | ICD-10-CM | POA: Diagnosis not present

## 2019-11-11 ENCOUNTER — Telehealth: Payer: Self-pay | Admitting: Hematology and Oncology

## 2019-11-11 NOTE — Telephone Encounter (Signed)
Scheduled per 6/28 sch msg. Called and spoke with pt, confirmed appts

## 2019-11-17 ENCOUNTER — Encounter (HOSPITAL_COMMUNITY): Payer: Self-pay | Admitting: Emergency Medicine

## 2019-11-17 ENCOUNTER — Emergency Department (HOSPITAL_COMMUNITY)
Admission: EM | Admit: 2019-11-17 | Discharge: 2019-11-17 | Disposition: A | Discharge status: Home / Self Care | Payer: Medicare Other | Attending: Emergency Medicine | Admitting: Emergency Medicine

## 2019-11-17 ENCOUNTER — Other Ambulatory Visit: Payer: Self-pay

## 2019-11-17 ENCOUNTER — Encounter (HOSPITAL_COMMUNITY): Payer: Self-pay

## 2019-11-17 ENCOUNTER — Emergency Department (HOSPITAL_COMMUNITY)
Admission: EM | Admit: 2019-11-17 | Discharge: 2019-11-17 | Disposition: A | Discharge status: Home / Self Care | Payer: Medicare Other | Source: Home / Self Care | Attending: Emergency Medicine | Admitting: Emergency Medicine

## 2019-11-17 DIAGNOSIS — Z923 Personal history of irradiation: Secondary | ICD-10-CM | POA: Diagnosis not present

## 2019-11-17 DIAGNOSIS — I10 Essential (primary) hypertension: Secondary | ICD-10-CM | POA: Diagnosis not present

## 2019-11-17 DIAGNOSIS — Z85830 Personal history of malignant neoplasm of bone: Secondary | ICD-10-CM | POA: Insufficient documentation

## 2019-11-17 DIAGNOSIS — Z7901 Long term (current) use of anticoagulants: Secondary | ICD-10-CM | POA: Diagnosis not present

## 2019-11-17 DIAGNOSIS — Z8542 Personal history of malignant neoplasm of other parts of uterus: Secondary | ICD-10-CM | POA: Diagnosis not present

## 2019-11-17 DIAGNOSIS — Z79899 Other long term (current) drug therapy: Secondary | ICD-10-CM | POA: Insufficient documentation

## 2019-11-17 DIAGNOSIS — Z87891 Personal history of nicotine dependence: Secondary | ICD-10-CM | POA: Diagnosis not present

## 2019-11-17 DIAGNOSIS — E039 Hypothyroidism, unspecified: Secondary | ICD-10-CM | POA: Insufficient documentation

## 2019-11-17 DIAGNOSIS — Z853 Personal history of malignant neoplasm of breast: Secondary | ICD-10-CM | POA: Insufficient documentation

## 2019-11-17 DIAGNOSIS — R040 Epistaxis: Secondary | ICD-10-CM | POA: Insufficient documentation

## 2019-11-17 MED ORDER — HYDROCODONE-ACETAMINOPHEN 5-325 MG PO TABS
1.0000 | ORAL_TABLET | Freq: Once | ORAL | Status: AC
  Administered 2019-11-17: 1 via ORAL
  Filled 2019-11-17: qty 1

## 2019-11-17 MED ORDER — SILVER NITRATE-POT NITRATE 75-25 % EX MISC
1.0000 "application " | Freq: Once | CUTANEOUS | Status: AC
  Administered 2019-11-17: 1 via TOPICAL
  Filled 2019-11-17: qty 1
  Filled 2019-11-17: qty 10

## 2019-11-17 MED ORDER — HYDROCODONE-ACETAMINOPHEN 5-325 MG PO TABS: 1.0000 | ORAL_TABLET | ORAL | 0 refills | Status: DC | PRN

## 2019-11-17 MED ORDER — OXYMETAZOLINE HCL 0.05 % NA SOLN
3.0000 | Freq: Once | NASAL | Status: AC
  Administered 2019-11-17: 3 via NASAL
  Filled 2019-11-17: qty 30

## 2019-11-17 MED ORDER — AMOXICILLIN 250 MG PO CAPS
250.0000 mg | ORAL_CAPSULE | Freq: Three times a day (TID) | ORAL | 0 refills | Status: DC
Start: 2019-11-17 — End: 2019-11-29

## 2019-11-17 NOTE — ED Triage Notes (Signed)
Pt having nose bleed for about 30 minutes. Reports is on Xeralto. Pt reports wont stop.

## 2019-11-17 NOTE — ED Triage Notes (Signed)
Pt reports that epistaxis returned. States that it is the opposite nostril from earlier today. Pt on Xarelto, packing from earlier today still in tact.

## 2019-11-17 NOTE — Discharge Instructions (Addendum)
Sleep sitting up as much as possible to decrease complications including pain and bleeding.  Take Tylenol regularly for pain.  We sent a prescription to your pharmacy to prevent infection.  Return here if needed for problems with bleeding or other concerns.

## 2019-11-17 NOTE — ED Provider Notes (Signed)
[facility] DEPT Provider Note   CSN: [id number] Arrival date & time: 11/17/19  1511     History Chief Complaint  Patient presents with  . Epistaxis    [name] is a 79 y.o. female.  HPI She developed a nosebleed just prior to coming here without known cause.  She is on Xarelto, for DVT, x3 weeks.  No prior episodes of nosebleed.  No recent illnesses including rhinorrhea, sinus pressure, sore throat, ear pain, cough, weakness or dizziness.  She has been eating well.  There are no other known modifying factors.    Past Medical History:  Diagnosis Date  . Endometrial cancer ([place]) 12/2007   s/p total abdominal hysterectomy at [facility] - ovaries were also removed   . Family history of breast cancer   . History of breast cancer    right breast -- treated with lumpectomy and radiation therarpy, postoperatively with tamoxifen   . History of radiation therapy 10/22/2016 to 10/28/2016  . Hypercholesterolemia   . Hyperlipidemia   . Hypertension   . Palpitations   . Personal history of radiation therapy 2006  . PVC's (premature ventricular contractions)   . Radiation 07/11/14-08/12/14   left lumbar paraspinal area 55 gray  . S/P radiation therapy 10/28/11-12/05/11   5040 cGy left pelvis  . S/P radiation therapy    Intracavitary brachytherapy of uterus    Patient Active Problem List   Diagnosis Date Noted  . Malignant cachexia ([place]) 11/08/2019  . Deep vein blood clot of right lower extremity ([place]) 11/08/2019  . Recurrent epistaxis 11/08/2019  . Elevated serum creatinine 08/16/2019  . Acquired hypothyroidism 07/26/2019  . TIA (transient ischemic attack) 05/25/2019  . Goals of care, counseling/discussion 04/19/2019  . Dehydration 01/14/2019  . Pancytopenia, acquired ([place]) 01/14/2019  . Dysuria 12/29/2018  . Right lower lobe pulmonary nodule 12/07/2018  . Primary lung cancer, right ([place]) 11/04/2018  . Essential hypertension 01/24/2017  . Genetic  testing 10/31/2016  . History of breast cancer   . Family history of breast cancer   . Aortic atherosclerosis ([place]) 06/08/2016  . Plantar fasciitis of left foot 12/13/2015  . Anemia due to antineoplastic chemotherapy 11/14/2015  . Lactose intolerance in adult 11/14/2015  . Chemotherapy-induced neuropathy ([place]) 10/08/2015  . Dense breast tissue 10/08/2015  . Port catheter in place 09/28/2015  . Central line complication 02/58/5277  . Chemotherapy induced neutropenia ([place]) 08/22/2015  . Arterial hypotension 08/12/2015  . HX: breast cancer 02/07/2015  . Metastatic cancer to bone ([place]) 06/27/2014  . Hypotension 06/27/2011  . Endometrial cancer ([place]) 05/15/2011  . Hypercholesterolemia 12/28/2010  . Palpitations 12/28/2010    Past Surgical History:  Procedure Laterality Date  . APPENDECTOMY    . BREAST LUMPECTOMY Right 2000  . COLONOSCOPY     3X  . FOOT SURGERY     RIGHT  . SEGMENTECOMY Right 12/07/2018   Procedure: RIGHT LOWER LOBE SUPERIOR SEGMENTECTOMY;  Surgeon: Melrose Nakayama, MD;  Location: [facility];  Service: Thoracic;  Laterality: Right;  . TONSILLECTOMY    . TOTAL ABDOMINAL HYSTERECTOMY W/ BILATERAL SALPINGOOPHORECTOMY    . VIDEO ASSISTED THORACOSCOPY Right 12/07/2018   Procedure: VIDEO ASSISTED THORACOSCOPY;  Surgeon: Melrose Nakayama, MD;  Location: [facility];  Service: Thoracic;  Laterality: Right;     OB History   No obstetric history on file.     Family History  Problem Relation Age of Onset  . Thrombosis Father        coronary  thrombosis  . Hypertension Father   . Heart failure Mother   . Coronary artery disease Brother   . Breast cancer Maternal Aunt        dx in her 79s  . Stroke Maternal Uncle   . Stroke Maternal Grandfather     Social History   Tobacco Use  . Smoking status: Former Smoker    Types: Cigarettes    Quit date: 05/13/1962    Years since quitting: 57.5  . Smokeless tobacco: Never Used  Vaping Use  . Vaping Use: Never used    Substance Use Topics  . Alcohol use: Yes    Comment: occasionally  . Drug use: No    Home Medications Prior to Admission medications   Medication Sig Start Date End Date Taking? Authorizing Provider  amLODipine (NORVASC) 5 MG tablet Take 1 tablet (5 mg total) by mouth daily. 10/04/19  Yes Gorsuch, Ni, MD  atenolol (TENORMIN) 25 MG tablet Take 25 mg by mouth 2 (two) times daily.  01/08/11  Yes Darlin Coco, MD  lenvatinib 10 mg daily dose (LENVIMA, 10 MG DAILY DOSE,) capsule Take 1 capsule (10 mg total) by mouth daily. 04/19/19  Yes Gorsuch, Ni, MD  lidocaine-prilocaine (EMLA) cream Apply to [facility] cath 1-2 hours prior to access as directed 09/12/15  Yes Livesay, Lennis P, MD  mirtazapine (REMERON) 7.5 MG tablet Take 1 tablet (7.5 mg total) by mouth at bedtime. 11/08/19  Yes Heath Lark, MD  Multiple Vitamin (MULTIVITAMIN WITH MINERALS) TABS tablet Take 1 tablet by mouth daily.   Yes [provider]  rivaroxaban (XARELTO) 10 MG TABS tablet Take 1 tablet (10 mg total) by mouth daily. 11/08/19  Yes Gorsuch, Ni, MD  simvastatin (ZOCOR) 20 MG tablet Take 20 mg by mouth every evening.    Yes [provider]  amoxicillin (AMOXIL) 250 MG capsule Take 1 capsule (250 mg total) by mouth 3 (three) times daily. 11/17/19   Daleen Bo, MD  aspirin EC 81 MG tablet Take 81 mg by mouth daily.    [provider]  LORazepam (ATIVAN) 0.5 MG tablet 1 tablet po 30 minutes prior to radiation or MRI 10/08/16   Hayden Pedro, PA-C    Allergies    Ace inhibitors, Dilaudid [hydromorphone hcl], and Codeine  Review of Systems   Review of Systems  All other systems reviewed and are negative.   Physical Exam Updated Vital Signs BP (!) 167/113   Pulse (!) 107   Resp 20   SpO2 97%   Physical Exam Vitals and nursing note reviewed.  Constitutional:      General: She is in acute distress (Anxious, uncomfortable).     Appearance: She is well-developed. She is not ill-appearing,  toxic-appearing or diaphoretic.  HENT:     Head: Normocephalic and atraumatic.     Right Ear: External ear normal.     Left Ear: External ear normal.     Nose:     Comments: Active bleeding from right anterior nasal septum, with mild pulsatile flow.  Patient with large blood clot in right nares initially, also coughed up some blood clots during the evaluation. Eyes:     Conjunctiva/sclera: Conjunctivae normal.     Pupils: Pupils are equal, round, and reactive to light.  Neck:     Trachea: Phonation normal.  Cardiovascular:     Rate and Rhythm: Normal rate.  Pulmonary:     Effort: Pulmonary effort is normal.  Abdominal:     General: There  is no distension.  Musculoskeletal:        General: Normal range of motion.     Cervical back: Normal range of motion and neck supple.  Skin:    General: Skin is warm and dry.  Neurological:     Mental Status: She is alert and oriented to person, place, and time.     Cranial Nerves: No cranial nerve deficit.     Sensory: No sensory deficit.     Motor: No abnormal muscle tone.     Coordination: Coordination normal.  Psychiatric:        Mood and Affect: Mood normal.        Behavior: Behavior normal.        Thought Content: Thought content normal.        Judgment: Judgment normal.     ED Results / Procedures / Treatments   Labs (all labs ordered are listed, but only abnormal results are displayed) Labs Reviewed - No data to display  EKG None  Radiology No results found.  Procedures .Epistaxis Management  Date/Time: 11/17/2019 4:57 PM Performed by: Daleen Bo, MD Authorized by: Daleen Bo, MD   Consent:    Consent obtained:  Verbal   Consent given by:  Patient and spouse   Risks discussed:  Bleeding   Alternatives discussed:  No treatment Anesthesia (see MAR for exact dosages):    Anesthesia method:  None Procedure details:    Treatment site:  R anterior   Treatment method:  Silver nitrate and nasal balloon    Treatment complexity:  Extensive   Treatment episode: recurring   Post-procedure details:    Assessment:  Bleeding stopped   Patient tolerance of procedure:  Tolerated well, no immediate complications Comments:     Initial treatment multiple application of silver nitrate did not control the bleeding.  Rapid Rhino, 5 cm anterior balloon, and surgery in the right nares by me.  I inflated the balloon with 6 cc of air, to control the bleeding.  .Critical Care Performed by: Daleen Bo, MD Authorized by: Daleen Bo, MD   Critical care provider statement:    Critical care time (minutes):  35   Critical care start time:  11/17/2019 4:25 PM   Critical care end time:  11/17/2019 5:07 PM   Critical care time was exclusive of:  Separately billable procedures and treating other patients   Critical care was necessary to treat or prevent imminent or life-threatening deterioration of the following conditions: Nosebleed.   Critical care was time spent personally by me on the following activities:  Blood draw for specimens, development of treatment plan with patient or surrogate, discussions with consultants, evaluation of patient's response to treatment, examination of patient, obtaining history from patient or surrogate, ordering and performing treatments and interventions, ordering and review of laboratory studies, pulse oximetry, re-evaluation of patient's condition, review of old charts and ordering and review of radiographic studies   (including critical care time)  Medications Ordered in ED Medications  silver nitrate applicators applicator 1 application (has no administration in time range)  oxymetazoline (AFRIN) 0.05 % nasal spray 3 spray (3 sprays Each Nare Given 11/17/19 1558)    ED Course  I have reviewed the triage vital signs and the nursing notes.  Pertinent labs & imaging results that were available during my care of the patient were reviewed by me and considered in my medical decision  making (see chart for details).    MDM Rules/Calculators/A&P  Patient Vitals for the past 24 hrs:  BP Pulse Resp SpO2  11/17/19 1518 (!) 167/113 (!) 107 20 97 %    5:00 PM Reevaluation with update and discussion. After initial assessment and treatment, an updated evaluation reveals bleeding from right nares is controlled, and patient is comfortable with nasal balloon inflated.  Findings discussed with patient and husband, all questions answered. Daleen Bo   Medical Decision Making:  This patient is presenting for evaluation of nasal bleeding, without trauma, anticoagulated, which does require a range of treatment options, and is a complaint that involves a moderate risk of morbidity and mortality. The differential diagnoses include infection, minor trauma, complication of anticoagulation. I decided to review old records, and in summary elderly female recently started on Xarelto for DVT.  I obtained additional historical information from her husband at the bedside.   Critical Interventions-clinical evaluation, urgent treatment of epistaxis, observation and reassessment  After These Interventions, the Patient was reevaluated and was found stable for discharge.  Bleeding controlled with combination of silver nitrate and nasal balloon placed in the right nares.  Patient hemodynamically stable does not require further ED intervention, evaluation or hospitalization.  CRITICAL CARE-yes Performed by: Daleen Bo  Nursing Notes Reviewed/ Care Coordinated Applicable Imaging Reviewed Interpretation of Laboratory Data incorporated into ED treatment  The patient appears reasonably screened and/or stabilized for discharge and I doubt any other medical condition or other [facility] requiring further screening, evaluation, or treatment in the ED at this time prior to discharge.  Plan: Home Medications-prophylactic antibiotic prescription for amoxicillin sent to her pharmacy,  continue current medications; Home Treatments-care for nasal balloon at home; return here if the recommended treatment, does not improve the symptoms; Recommended follow up-ENT follow-up 1 week for removal of nasal packing     Final Clinical Impression(s) / ED Diagnoses Final diagnoses:  Acute anterior epistaxis    Rx / DC Orders ED Discharge Orders         Ordered    amoxicillin (AMOXIL) 250 MG capsule  3 times daily     Discontinue  Reprint     11/17/19 1709           Daleen Bo, MD 11/17/19 1711

## 2019-11-17 NOTE — Discharge Instructions (Addendum)
Week for an additional 4 cc of air into the balloon in your right nostril which has stopped the bleeding, for now.  It is understandable you are still having pain.  Use the prescribed pain medicine as needed for comfort.  Again, follow-up with your ENT doctor for removal of the balloon in 7 days or so.  Return here if needed for problems.

## 2019-11-17 NOTE — ED Provider Notes (Signed)
[facility] DEPT Provider Note   CSN: [id number] Arrival date & time: 11/17/19  2002     History Chief Complaint  Patient presents with  . Epistaxis    [name] is a 79 y.o. female.  HPI She presents for evaluation of nose bleeding, from the left nares.  I saw her earlier and placed a nasal balloon into the right nares to control bleeding.  Her bleeding was spontaneous and felt to be related to a superficial bleeding vessel in the anterior right nares.  Currently, she denies headache, weakness or dizziness.  There are no other known modifying factors.    Past Medical History:  Diagnosis Date  . Endometrial cancer ([place]) 12/2007   s/p total abdominal hysterectomy at [facility] - ovaries were also removed   . Family history of breast cancer   . History of breast cancer    right breast -- treated with lumpectomy and radiation therarpy, postoperatively with tamoxifen   . History of radiation therapy 10/22/2016 to 10/28/2016  . Hypercholesterolemia   . Hyperlipidemia   . Hypertension   . Palpitations   . Personal history of radiation therapy 2006  . PVC's (premature ventricular contractions)   . Radiation 07/11/14-08/12/14   left lumbar paraspinal area 55 gray  . S/P radiation therapy 10/28/11-12/05/11   5040 cGy left pelvis  . S/P radiation therapy    Intracavitary brachytherapy of uterus    Patient Active Problem List   Diagnosis Date Noted  . Malignant cachexia ([place]) 11/08/2019  . Deep vein blood clot of right lower extremity ([place]) 11/08/2019  . Recurrent epistaxis 11/08/2019  . Elevated serum creatinine 08/16/2019  . Acquired hypothyroidism 07/26/2019  . TIA (transient ischemic attack) 05/25/2019  . Goals of care, counseling/discussion 04/19/2019  . Dehydration 01/14/2019  . Pancytopenia, acquired ([place]) 01/14/2019  . Dysuria 12/29/2018  . Right lower lobe pulmonary nodule 12/07/2018  . Primary lung cancer, right ([place]) 11/04/2018  .  Essential hypertension 01/24/2017  . Genetic testing 10/31/2016  . History of breast cancer   . Family history of breast cancer   . Aortic atherosclerosis ([place]) 06/08/2016  . Plantar fasciitis of left foot 12/13/2015  . Anemia due to antineoplastic chemotherapy 11/14/2015  . Lactose intolerance in adult 11/14/2015  . Chemotherapy-induced neuropathy ([place]) 10/08/2015  . Dense breast tissue 10/08/2015  . Port catheter in place 09/28/2015  . Central line complication 18/56/3149  . Chemotherapy induced neutropenia ([place]) 08/22/2015  . Arterial hypotension 08/12/2015  . HX: breast cancer 02/07/2015  . Metastatic cancer to bone ([place]) 06/27/2014  . Hypotension 06/27/2011  . Endometrial cancer ([place]) 05/15/2011  . Hypercholesterolemia 12/28/2010  . Palpitations 12/28/2010    Past Surgical History:  Procedure Laterality Date  . APPENDECTOMY    . BREAST LUMPECTOMY Right 2000  . COLONOSCOPY     3X  . FOOT SURGERY     RIGHT  . SEGMENTECOMY Right 12/07/2018   Procedure: RIGHT LOWER LOBE SUPERIOR SEGMENTECTOMY;  Surgeon: Melrose Nakayama, MD;  Location: [facility];  Service: Thoracic;  Laterality: Right;  . TONSILLECTOMY    . TOTAL ABDOMINAL HYSTERECTOMY W/ BILATERAL SALPINGOOPHORECTOMY    . VIDEO ASSISTED THORACOSCOPY Right 12/07/2018   Procedure: VIDEO ASSISTED THORACOSCOPY;  Surgeon: Melrose Nakayama, MD;  Location: [facility];  Service: Thoracic;  Laterality: Right;     OB History   No obstetric history on file.     Family History  Problem Relation Age of Onset  . Thrombosis Father  coronary thrombosis  . Hypertension Father   . Heart failure Mother   . Coronary artery disease Brother   . Breast cancer Maternal Aunt        dx in her 79s  . Stroke Maternal Uncle   . Stroke Maternal Grandfather     Social History   Tobacco Use  . Smoking status: Former Smoker    Types: Cigarettes    Quit date: 05/13/1962    Years since quitting: 57.5  . Smokeless tobacco: Never Used    Vaping Use  . Vaping Use: Never used  Substance Use Topics  . Alcohol use: Yes    Comment: occasionally  . Drug use: No    Home Medications Prior to Admission medications   Medication Sig Start Date End Date Taking? Authorizing Provider  amLODipine (NORVASC) 5 MG tablet Take 1 tablet (5 mg total) by mouth daily. 10/04/19   Heath Lark, MD  amoxicillin (AMOXIL) 250 MG capsule Take 1 capsule (250 mg total) by mouth 3 (three) times daily. 11/17/19   Daleen Bo, MD  aspirin EC 81 MG tablet Take 81 mg by mouth daily.    [provider]  atenolol (TENORMIN) 25 MG tablet Take 25 mg by mouth 2 (two) times daily.  01/08/11   Darlin Coco, MD  HYDROcodone-acetaminophen (NORCO/VICODIN) 5-325 MG tablet Take 1 tablet by mouth every 4 (four) hours as needed. 11/17/19   Daleen Bo, MD  lenvatinib 10 mg daily dose (LENVIMA, 10 MG DAILY DOSE,) capsule Take 1 capsule (10 mg total) by mouth daily. 04/19/19   Heath Lark, MD  lidocaine-prilocaine (EMLA) cream Apply to [facility] cath 1-2 hours prior to access as directed 09/12/15   Gordy Levan, MD  LORazepam (ATIVAN) 0.5 MG tablet 1 tablet po 30 minutes prior to radiation or MRI 10/08/16   Hayden Pedro, PA-C  mirtazapine (REMERON) 7.5 MG tablet Take 1 tablet (7.5 mg total) by mouth at bedtime. 11/08/19   Heath Lark, MD  Multiple Vitamin (MULTIVITAMIN WITH MINERALS) TABS tablet Take 1 tablet by mouth daily.    [provider]  rivaroxaban (XARELTO) 10 MG TABS tablet Take 1 tablet (10 mg total) by mouth daily. 11/08/19   Heath Lark, MD  simvastatin (ZOCOR) 20 MG tablet Take 20 mg by mouth every evening.     [provider]    Allergies    Ace inhibitors, Dilaudid [hydromorphone hcl], and Codeine  Review of Systems   Review of Systems  All other systems reviewed and are negative.   Physical Exam Updated Vital Signs BP (!) 135/97 (BP Location: Left Arm)   Pulse (!) 116   Temp 98.2 F (36.8 C) (Oral)   Resp 16    Ht 4\' 11"  (1.499 m)   Wt 42.2 kg   SpO2 98%   BMI 18.78 kg/m   Physical Exam Vitals and nursing note reviewed.  Constitutional:      General: She is not in acute distress.    Appearance: She is well-developed. She is not ill-appearing, toxic-appearing or diaphoretic.  HENT:     Head: Normocephalic and atraumatic.     Right Ear: External ear normal.     Left Ear: External ear normal.     Nose:     Comments: Rapid Rhino present right nares appears unchanged from earlier.  Small amount of bleeding from the left nares. Eyes:     Conjunctiva/sclera: Conjunctivae normal.     Pupils: Pupils are equal, round, and reactive to light.  Neck:     Trachea: Phonation normal.  Cardiovascular:     Rate and Rhythm: Normal rate.  Pulmonary:     Effort: Pulmonary effort is normal.  Musculoskeletal:        General: Normal range of motion.     Cervical back: Normal range of motion and neck supple.  Skin:    General: Skin is warm and dry.  Neurological:     Mental Status: She is alert and oriented to person, place, and time.     Cranial Nerves: No cranial nerve deficit.     Sensory: No sensory deficit.     Motor: No abnormal muscle tone.     Coordination: Coordination normal.  Psychiatric:        Mood and Affect: Mood normal.        Behavior: Behavior normal.        Thought Content: Thought content normal.        Judgment: Judgment normal.     ED Results / Procedures / Treatments   Labs (all labs ordered are listed, but only abnormal results are displayed) Labs Reviewed - No data to display  EKG None  Radiology No results found.  Procedures Procedures (including critical care time)  Medications Ordered in ED Medications  HYDROcodone-acetaminophen (NORCO/VICODIN) 5-325 MG per tablet 1 tablet (1 tablet Oral Given 11/17/19 2132)    ED Course  I have reviewed the triage vital signs and the nursing notes.  Pertinent labs & imaging results that were available during my care of  the patient were reviewed by me and considered in my medical decision making (see chart for details).  Clinical Course as of Nov 16 2217  Wed Nov 17, 2019  2115 After instillation of 4 cc of air into the balloon, 10 total today, she is having pain in the right nares but her bleeding is better.   [EW]  2216 There is no bleeding from the left or right nares at this time.  She states her pain is controlled enough to go home.   [EW]    Clinical Course User Index [EW] Daleen Bo, MD   MDM Rules/Calculators/A&P                           Patient Vitals for the past 24 hrs:  BP Temp Temp src Pulse Resp SpO2 Height Weight  11/17/19 2017 (!) 135/97 98.2 F (36.8 C) Oral (!) 116 16 98 % 4\' 11"  (1.499 m) 42.2 kg    10:15 PM  reevaluation with update and discussion. After initial assessment and treatment, an updated evaluation reveals she is fairly comfortable and ready go home.  Findings discussed with patient and husband.  All questions answered. Daleen Bo   Medical Decision Making:  This patient is presenting for evaluation of nosebleeding, which does require a range of treatment options, and is a complaint that involves a moderate risk of morbidity and mortality. The differential diagnoses include uncontrolled bleeding from right nares, no bleeding from left nares. I decided to review old records, and in summary elderly female recently started on Xarelto, treated earlier by me today with nasal balloon placed in the right nares.  I obtained additional historical information from her husband at the bedside.    Critical Interventions-clinical evaluation, additional air, 4 cc, placed into the nasal balloon that was already present in the right nares, observation, reassessment  After These Interventions, the Patient was reevaluated and was found patient  comfortable bleeding stopped, no indication for further treatment or hospitalization at this time.  CRITICAL CARE-no Performed by:  Daleen Bo  Nursing Notes Reviewed/ Care Coordinated Applicable Imaging Reviewed Interpretation of Laboratory Data incorporated into ED treatment  The patient appears reasonably screened and/or stabilized for discharge and I doubt any other medical condition or other [facility] requiring further screening, evaluation, or treatment in the ED at this time prior to discharge.  Plan: Home Medications-continue currently prescribed medicines; Home Treatments-care for nasal balloon at home; return here if the recommended treatment, does not improve the symptoms; Recommended follow up-ENT follow-up 1 week and as needed     Final Clinical Impression(s) / ED Diagnoses Final diagnoses:  Epistaxis    Rx / DC Orders ED Discharge Orders         Ordered    HYDROcodone-acetaminophen (NORCO/VICODIN) 5-325 MG tablet  Every 4 hours PRN     Discontinue  Reprint     11/17/19 2211           Daleen Bo, MD 11/17/19 2219

## 2019-11-18 ENCOUNTER — Ambulatory Visit (HOSPITAL_COMMUNITY): Payer: Medicare Other

## 2019-11-18 ENCOUNTER — Telehealth: Payer: Self-pay

## 2019-11-18 NOTE — Telephone Encounter (Signed)
Called and given below message. She verbalized understanding and will call PCP.

## 2019-11-18 NOTE — Telephone Encounter (Signed)
-----   Message from Heath Lark, MD sent at 11/18/2019  8:33 AM EDT ----- Regarding: recurrent nose bleeds When I saw her last, I suggest reducing her Xarelto She called back and declined the suggestion and want to follow with PCP advice instead ER routed me a note and said she went to the ER with more nose bleeds again Please call pt and ask her to set up appt to see PCP since I am not managing

## 2019-11-22 ENCOUNTER — Telehealth: Payer: Self-pay | Admitting: Radiation Oncology

## 2019-11-22 ENCOUNTER — Inpatient Hospital Stay: Payer: Medicare Other | Attending: Hematology and Oncology

## 2019-11-22 DIAGNOSIS — I10 Essential (primary) hypertension: Secondary | ICD-10-CM | POA: Insufficient documentation

## 2019-11-22 DIAGNOSIS — J320 Chronic maxillary sinusitis: Secondary | ICD-10-CM | POA: Insufficient documentation

## 2019-11-22 DIAGNOSIS — Z5112 Encounter for antineoplastic immunotherapy: Secondary | ICD-10-CM | POA: Insufficient documentation

## 2019-11-22 DIAGNOSIS — E039 Hypothyroidism, unspecified: Secondary | ICD-10-CM | POA: Insufficient documentation

## 2019-11-22 DIAGNOSIS — R64 Cachexia: Secondary | ICD-10-CM | POA: Insufficient documentation

## 2019-11-22 DIAGNOSIS — I82401 Acute embolism and thrombosis of unspecified deep veins of right lower extremity: Secondary | ICD-10-CM | POA: Insufficient documentation

## 2019-11-22 DIAGNOSIS — K8020 Calculus of gallbladder without cholecystitis without obstruction: Secondary | ICD-10-CM | POA: Insufficient documentation

## 2019-11-22 DIAGNOSIS — C7951 Secondary malignant neoplasm of bone: Secondary | ICD-10-CM | POA: Insufficient documentation

## 2019-11-22 DIAGNOSIS — G3184 Mild cognitive impairment, so stated: Secondary | ICD-10-CM | POA: Insufficient documentation

## 2019-11-22 DIAGNOSIS — K5730 Diverticulosis of large intestine without perforation or abscess without bleeding: Secondary | ICD-10-CM | POA: Insufficient documentation

## 2019-11-22 DIAGNOSIS — R040 Epistaxis: Secondary | ICD-10-CM | POA: Insufficient documentation

## 2019-11-22 DIAGNOSIS — Z923 Personal history of irradiation: Secondary | ICD-10-CM | POA: Insufficient documentation

## 2019-11-22 DIAGNOSIS — I2510 Atherosclerotic heart disease of native coronary artery without angina pectoris: Secondary | ICD-10-CM | POA: Insufficient documentation

## 2019-11-22 DIAGNOSIS — Z9221 Personal history of antineoplastic chemotherapy: Secondary | ICD-10-CM | POA: Insufficient documentation

## 2019-11-22 DIAGNOSIS — Z79899 Other long term (current) drug therapy: Secondary | ICD-10-CM | POA: Insufficient documentation

## 2019-11-22 DIAGNOSIS — I700 Atherosclerosis of aorta: Secondary | ICD-10-CM | POA: Insufficient documentation

## 2019-11-22 DIAGNOSIS — C541 Malignant neoplasm of endometrium: Secondary | ICD-10-CM | POA: Insufficient documentation

## 2019-11-24 DIAGNOSIS — Z9229 Personal history of other drug therapy: Secondary | ICD-10-CM | POA: Diagnosis not present

## 2019-11-24 DIAGNOSIS — R040 Epistaxis: Secondary | ICD-10-CM | POA: Diagnosis not present

## 2019-11-29 ENCOUNTER — Inpatient Hospital Stay: Payer: Medicare Other

## 2019-11-29 ENCOUNTER — Other Ambulatory Visit: Payer: Self-pay

## 2019-11-29 ENCOUNTER — Encounter: Payer: Self-pay | Admitting: Hematology and Oncology

## 2019-11-29 ENCOUNTER — Inpatient Hospital Stay (HOSPITAL_BASED_OUTPATIENT_CLINIC_OR_DEPARTMENT_OTHER): Payer: Medicare Other | Admitting: Hematology and Oncology

## 2019-11-29 VITALS — BP 145/72 | HR 80 | Temp 98.2°F | Resp 18 | Ht 59.0 in | Wt 91.4 lb

## 2019-11-29 DIAGNOSIS — Z923 Personal history of irradiation: Secondary | ICD-10-CM | POA: Diagnosis not present

## 2019-11-29 DIAGNOSIS — K8020 Calculus of gallbladder without cholecystitis without obstruction: Secondary | ICD-10-CM | POA: Diagnosis not present

## 2019-11-29 DIAGNOSIS — C541 Malignant neoplasm of endometrium: Secondary | ICD-10-CM

## 2019-11-29 DIAGNOSIS — Z79899 Other long term (current) drug therapy: Secondary | ICD-10-CM | POA: Diagnosis not present

## 2019-11-29 DIAGNOSIS — E039 Hypothyroidism, unspecified: Secondary | ICD-10-CM | POA: Diagnosis not present

## 2019-11-29 DIAGNOSIS — I10 Essential (primary) hypertension: Secondary | ICD-10-CM

## 2019-11-29 DIAGNOSIS — C3491 Malignant neoplasm of unspecified part of right bronchus or lung: Secondary | ICD-10-CM

## 2019-11-29 DIAGNOSIS — I700 Atherosclerosis of aorta: Secondary | ICD-10-CM | POA: Diagnosis not present

## 2019-11-29 DIAGNOSIS — R413 Other amnesia: Secondary | ICD-10-CM

## 2019-11-29 DIAGNOSIS — R040 Epistaxis: Secondary | ICD-10-CM

## 2019-11-29 DIAGNOSIS — K5730 Diverticulosis of large intestine without perforation or abscess without bleeding: Secondary | ICD-10-CM | POA: Diagnosis not present

## 2019-11-29 DIAGNOSIS — I824Y1 Acute embolism and thrombosis of unspecified deep veins of right proximal lower extremity: Secondary | ICD-10-CM | POA: Diagnosis not present

## 2019-11-29 DIAGNOSIS — J320 Chronic maxillary sinusitis: Secondary | ICD-10-CM | POA: Diagnosis not present

## 2019-11-29 DIAGNOSIS — R64 Cachexia: Secondary | ICD-10-CM

## 2019-11-29 DIAGNOSIS — C549 Malignant neoplasm of corpus uteri, unspecified: Secondary | ICD-10-CM

## 2019-11-29 DIAGNOSIS — G459 Transient cerebral ischemic attack, unspecified: Secondary | ICD-10-CM

## 2019-11-29 DIAGNOSIS — Z9221 Personal history of antineoplastic chemotherapy: Secondary | ICD-10-CM | POA: Diagnosis not present

## 2019-11-29 DIAGNOSIS — Z7189 Other specified counseling: Secondary | ICD-10-CM

## 2019-11-29 DIAGNOSIS — G3184 Mild cognitive impairment, so stated: Secondary | ICD-10-CM | POA: Diagnosis not present

## 2019-11-29 DIAGNOSIS — I82401 Acute embolism and thrombosis of unspecified deep veins of right lower extremity: Secondary | ICD-10-CM | POA: Diagnosis not present

## 2019-11-29 DIAGNOSIS — C7951 Secondary malignant neoplasm of bone: Secondary | ICD-10-CM | POA: Diagnosis not present

## 2019-11-29 DIAGNOSIS — I2510 Atherosclerotic heart disease of native coronary artery without angina pectoris: Secondary | ICD-10-CM | POA: Diagnosis not present

## 2019-11-29 DIAGNOSIS — Z5112 Encounter for antineoplastic immunotherapy: Secondary | ICD-10-CM | POA: Diagnosis not present

## 2019-11-29 LAB — CBC WITH DIFFERENTIAL (CANCER CENTER ONLY)
Abs Immature Granulocytes: 0.01 10*3/uL (ref 0.00–0.07)
Basophils Absolute: 0 10*3/uL (ref 0.0–0.1)
Basophils Relative: 1 %
Eosinophils Absolute: 0.2 10*3/uL (ref 0.0–0.5)
Eosinophils Relative: 3 %
HCT: 31.1 % — ABNORMAL LOW (ref 36.0–46.0)
Hemoglobin: 9.7 g/dL — ABNORMAL LOW (ref 12.0–15.0)
Immature Granulocytes: 0 %
Lymphocytes Relative: 16 %
Lymphs Abs: 1 10*3/uL (ref 0.7–4.0)
MCH: 31.6 pg (ref 26.0–34.0)
MCHC: 31.2 g/dL (ref 30.0–36.0)
MCV: 101.3 fL — ABNORMAL HIGH (ref 80.0–100.0)
Monocytes Absolute: 0.9 10*3/uL (ref 0.1–1.0)
Monocytes Relative: 15 %
Neutro Abs: 4 10*3/uL (ref 1.7–7.7)
Neutrophils Relative %: 65 %
Platelet Count: 194 10*3/uL (ref 150–400)
RBC: 3.07 MIL/uL — ABNORMAL LOW (ref 3.87–5.11)
RDW: 14.4 % (ref 11.5–15.5)
WBC Count: 6.1 10*3/uL (ref 4.0–10.5)
nRBC: 0 % (ref 0.0–0.2)

## 2019-11-29 LAB — CMP (CANCER CENTER ONLY)
ALT: 25 U/L (ref 0–44)
AST: 30 U/L (ref 15–41)
Albumin: 3.3 g/dL — ABNORMAL LOW (ref 3.5–5.0)
Alkaline Phosphatase: 55 U/L (ref 38–126)
Anion gap: 12 (ref 5–15)
BUN: 36 mg/dL — ABNORMAL HIGH (ref 8–23)
CO2: 19 mmol/L — ABNORMAL LOW (ref 22–32)
Calcium: 9.4 mg/dL (ref 8.9–10.3)
Chloride: 111 mmol/L (ref 98–111)
Creatinine: 1.07 mg/dL — ABNORMAL HIGH (ref 0.44–1.00)
GFR, Est AFR Am: 57 mL/min — ABNORMAL LOW (ref >60)
GFR, Estimated: 49 mL/min — ABNORMAL LOW (ref >60)
Glucose, Bld: 107 mg/dL — ABNORMAL HIGH (ref 70–99)
Potassium: 4.2 mmol/L (ref 3.5–5.1)
Sodium: 142 mmol/L (ref 135–145)
Total Bilirubin: 0.6 mg/dL (ref 0.3–1.2)
Total Protein: 6.8 g/dL (ref 6.5–8.1)

## 2019-11-29 LAB — TSH: TSH: <0.08 u[IU]/mL — ABNORMAL LOW (ref 0.308–3.960)

## 2019-11-29 LAB — TOTAL PROTEIN, URINE DIPSTICK

## 2019-11-29 LAB — T4, FREE: Free T4: 1.18 ng/dL — ABNORMAL HIGH (ref 0.61–1.12)

## 2019-11-29 MED ORDER — SODIUM CHLORIDE 0.9 % IV SOLN
200.0000 mg | Freq: Once | INTRAVENOUS | Status: AC
  Administered 2019-11-29: 200 mg via INTRAVENOUS
  Filled 2019-11-29: qty 8

## 2019-11-29 MED ORDER — SODIUM CHLORIDE 0.9 % IV SOLN
Freq: Once | INTRAVENOUS | Status: AC
  Filled 2019-11-29: qty 250

## 2019-11-29 MED ORDER — SODIUM CHLORIDE 0.9% FLUSH
10.0000 mL | INTRAVENOUS | Status: DC | PRN
  Administered 2019-11-29: 10 mL
  Filled 2019-11-29: qty 10

## 2019-11-29 MED ORDER — HEPARIN SOD (PORK) LOCK FLUSH 100 UNIT/ML IV SOLN
500.0000 [IU] | Freq: Once | INTRAVENOUS | Status: AC | PRN
  Administered 2019-11-29: 500 [IU]
  Filled 2019-11-29: qty 5

## 2019-11-29 NOTE — Progress Notes (Signed)
[place] OFFICE PROGRESS NOTE  Patient Care Team: Shon Baton, MD as PCP - General (Internal Medicine) Heath Lark, MD as Consulting Physician (Hematology and Oncology)  ASSESSMENT & PLAN:  Endometrial cancer [facility]) She has positive response to therapy based on recent imaging The plan will be to continue treatment indefinitely I will see her back again in 3 weeks for further follow-up Her next PET CT scan will not be due until the end of the year  Primary lung cancer, right [facility]) She has no signs or symptoms of relapse in her lung We will continue serial imaging study to follow  Malignant cachexia ([place]) She continues to have progressive weight loss According to her husband, the patient is eating well I recommend calorie count and documentation of her food intake over the next 3 months  Deep vein blood clot of right lower extremity ([place]) She is on anticoagulation therapy due to diagnosis of blood clot The patient is currently taking reduced dose Xarelto I verify that she has stopped aspirin therapy She will need to be on anticoagulation therapy for minimum of 3 months  Acquired hypothyroidism She continues to complain of fatigue Her TSH is suppressed She is not on Synthroid right now Continue close monitoring  Memory impairment She has clear signs of memory impairments There were several instances that the patient cannot recall discussions I recommend her husband to come with her with each visit I recommend neurology consult to test her memory as I believe the patient might have mild cognitive impairment  Essential hypertension She has intermittent elevated blood pressure I recommend she document her blood pressure twice a day   Recurrent epistaxis She has recurrent epistaxis while on Xarelto The dose is reduced to 10 mg She is no longer taking aspirin I recommend she try a little bit of Vaseline at the tips of her nasal passages to avoid it getting to  dry that could precipitate recurrent nosebleeds I also recommend she checks her blood pressure twice a day in case elevated blood pressure is a contributing factor to her recurrent bleeding   Orders Placed This Encounter  Procedures  . Ambulatory referral to Neurology    Referral Priority:   Routine    Referral Type:   Consultation    Referral Reason:   Specialty Services Required    Requested Specialty:   Neurology    Number of Visits Requested:   1    All questions were answered. The patient knows to call the clinic with any problems, questions or concerns. The total time spent in the appointment was 40 minutes encounter with patients including review of chart and various tests results, discussions about plan of care and coordination of care plan   Heath Lark, MD 11/29/2019 11:08 AM  INTERVAL HISTORY: Please see below for problem oriented charting. She returns with her husband for further follow-up She had recurrent emergency room visits due to recurrent epistaxis From our previous discussion, I recommended the patient to reduce Xarelto to half a dose but the patient did not do it as instructed Her husband noticed some memory impairment recently She continues to have progressive weight loss Compared to her weight from June of last year, she has lost over 20 pounds According to the patient, she is eating well Her husband concurred that the patient is eating well although I do not have any sampling of her daily oral intake She is using nutritional supplement only once a day but her husband has the  impression she is taking it twice a day She is seen orthopedic surgeon about foot problem She has not been checking her blood pressure on a regular basis She has no other forms of bleeding recently except for recurrent epistaxis  SUMMARY OF ONCOLOGIC HISTORY: Oncology History Overview Note  Biopsy of recurrence 06-2014 ER negative. PDL1 testing low at 2% on the uterine cancer She  progressed on carboplatin and Paclitaxel for uterine cancer HER 2 negative on pathology [id number] Negative genetic testing  On her lung cancer sample, [employer] [facility] testing revealed 10% PD-1 positive EGFR mutation was detected, exon 19 deletion   Endometrial cancer ([place])  12/01/2007 Initial Diagnosis   She has recurrent papillary serous endometrial carcinoma. Briefly she was diagnosed in 2009 with a FIGO IB pappilary serous carcinoma treated with hysterectomy followed by adjuvant chemo and vaginal brachytherapy. She then had a recurrence diagnosed in late 2012 that was deemed not to be surgically resectable, and was treated with 6 cycles of carbo/taxol followed by 5040 cGy of EBRT to the pelvic mass. She was then followed with serial imaging and was noted in June of this year to have slight increase in the size of the mass, and again in September there was small enlargement of the mass. She had a re-staging PET scan on 03/10/13 that showed FDG activity in the pelvic mass, with no other areas of disease   12/01/2007 Imaging   CT scan of chest, abdomen and pelvis: 1.  Mass like area of decreased attenuation in the central uterus, consistent with the known endometrial carcinoma.  Deep myometrial invasion is suspected.  Pelvic MRI without and with contrast could be performed for further staging workup if clinically warranted. 2.  No evidence of extrauterine extension or lymphadenopathy. 3.  Sigmoid diverticulosis incidentally noted.   12/15/2007 Surgery   --12/2007 laparoscopic hysterectomy and PLND --05/2008 complted 6 cycles adjuvant carbo/taxol followed by vaginal cuff brachy --03/2011 exploratory lararoscopy, ureterolysis --4/13 completed 6 cycels carbotaxol --8/13 completed EBRT to the left pelvic sidewall    06/10/2008 Imaging   CT scan abdomen and pelvis 1.  Nonspecific mildly prominent inguinal lymph nodes, right greater than left. 2.  Interval hysterectomy without evidence of recurrent  pelvic mass or fluid collection.   03/22/2011 Imaging   Ct scan abdomen and pelvis 1.  Local uterine cancer recurrence with two large nodes along the left pelvic sidewall. 2.  No evidence of bowel obstruction, urinary obstruction, or more distant metastasis.   03/31/2011 Relapse/Recurrence   chemotherapy and external beam radiation   05/16/2011 - 09/17/2011 Chemotherapy   She had 6 cycles of carboplatin and Taxol    07/17/2011 Imaging   Ct scan abdomen and pelvis 1.  Interval improvement in the previously demonstrated left pelvic local recurrence of tumor. 2.  No disease progression or complication identified. 3.  Mild bladder wall thickening on the right, nonspecific and possibly related to incomplete distension and/or radiation therapy. 4.  Cholelithiasis   10/11/2011 Imaging   CT scan abdomen and pelvis 1.  Interval enlargement of the left pelvic sidewall masses. 2.  No new nodular disease in the pelvis or lymphadenopathy. 3.  No evidence  of distant metastasis. 4.  Mild hydronephrosis on the right is similar to prior. 5.  Focal thickening of the right aspect the bladder is stable compared to prior.    10/28/2011 - 12/05/2011 Radiation Therapy   radiation for pelvic sidewall recurrence   10/28/2011 - 12/05/2011 Radiation Therapy   10/28/11-12/05/11: Radiotherapy to the  left pelvic sidewall   03/18/2012 Imaging   CT abdomen 1.  Today's study demonstrates a positive response to therapy with decreased size of left pelvic sidewall nodal masses, as detailed above.  No new soft tissue masses or new lymphadenopathy is identified within the abdomen or pelvis. Continue attention on follow-up studies is recommended. 2.  Cholelithiasis without findings to suggest acute cholecystitis. 3.  Colonic diverticulosis without findings to suggest acute diverticulitis at this time. 4.  Mild asymmetric urinary bladder wall thickening is unchanged compared to prior examinations and is nonspecific.  No  definite bladder wall mass is identified at this time. 5.  Additional findings, similar to prior examinations, as above   06/22/2012 Imaging   CT abdomen 1.  Further decreased size of the left pelvic peripherally enhancing lesion. 2.  No new sites of new or progressive disease. 3. Esophageal air fluid level suggests dysmotility or gastroesophageal reflux. 4.  Cholelithiasis   11/02/2012 Imaging   CT abdomen 1.  The left pelvic side wall lesion demonstrates mild increase in size from previous exam. 2.  No new sites of new or progressive disease. 3.  Cholelithiasis.    01/28/2013 Imaging   CT abdomen 1. Interval small increase in volume of enhancing mass adjacent to the left pelvic sidewall. 2. No evidence of abdominal or pelvic lymphadenopathy   04/02/2013 Surgery   pelvic mass resection with IORT   04/02/2013 - 04/02/2013 Radiation Therapy   04/02/13: Intraoperative radiotherapy to the left pelvis   05/10/2013 PET scan   1. Hypermetabolic mass in the deep left pelvis consistent with metastasis. 2. No additional evidence of local metastasis. No evidence of distant metastasis. 3. Small right lower lobe pulmonary nodule is not hypermetabolic.   07/27/2013 Imaging   No evidence of recurrent or metastatic disease. Apparent prior resection of the deep left pelvic metastasis.   02/08/2014 Imaging   No CT findings for recurrent or metastatic disease involving the abdomen/ pelvis   06/13/2014 Relapse/Recurrence   + recurrence paraspinous muscle   06/21/2014 Procedure   There is a soft tissue lesion along the left side of L3-L4. This lesion roughly measures up to 2.2 cm. Needle was positioned along the posterior aspect of the lesion. Small amount of air in the paraspinal tissues following needle removal   06/21/2014 Pathology Results   Bone, biopsy, left lumbar paraspinal - METASTATIC POORLY DIFFERENTIATED CARCINOMA, CONSISTENT WITH HIGH GRADE SEROUS CARCINOMA SEE COMMENT. Microscopic  Comment The carcinoma demonstrates the following immunophenotype: Cytokeratin 7 - patchy moderate to strong expression Estrogen receptor - negative expression P53 - strong diffuse expression TTF-1 - negative expression WT-1 - negative expression CD56 - focal moderate strong expression Synaptophysin - negative expression GCDFP - negative expression The history of primary endometrial papillary serous carcinoma and primary mammary carcinoma is noted. In the current case, the overall morphologic and immunophenotype are that of poorly differentiated carcinoma, consistent with high grade serous carcinoma.    07/11/2014 - 08/12/2014 Radiation Therapy   07/11/14-08/12/14: 55 Gy in 25 fractions to the Lumbar spine   08/15/2014 Imaging   CT scan of chest, abdomen and pelvis 1. Since the biopsy study of 06/21/2014, similar to slight decrease in size of a left paraspinous lesion at L3-4. 2. No new sites of metastatic disease identified. 3. 8 mm ground-glass nodule in the right lower lobe is grossly similar to 03/10/2013, suggesting a benign etiology. 4. Cholelithiasis. 5. Apparent sigmoid colonic wall thickening which could be due to underdistention. Colitis felt less likely.  6.  Atherosclerosis, including within the coronary arteries. 7. Pelvic floor laxity.   10/07/2014 Imaging   CT scan of chest, abdomen and pelvis: Stable to slight interval decrease in size of left paraspinous lesion at L3-4. No new sites of metastatic disease. Unchanged ground-glass nodule within the right lower lobe, potentially benign in etiology. Recommend attention on followup. Cholelithiasis. Sigmoid colonic diverticulosis. No CT evidence to suggest acute diverticulitis.   01/19/2015 Imaging   Ct scan of abdomen and pelvis 1. Decrease in size of left paraspinous metastasis. 2. No new sites of disease. 3. Stable ground-glass attenuating nodule within the superior segment of right lower lobe. 4. Gallstones   05/02/2015 Imaging    Ct abdomen and pelvis: Slight interval increase in size of left lumbar paraspinal soft tissue metastasis. No new sites of metastatic disease identified within the abdomen or pelvis.    05/11/2015 PET scan   1. The small soft tissue mass just lateral to the left L3 for neural foramen is hypermetabolic, favoring malignancy. 2. There is also a small focus of hypermetabolic activity between the left L1 and L2 transverse processes, but without a CT correlate. 3. The 13 mm sub solid nodule in the superior segment right lower lobe is stable from recent exams but has slowly increased in size over the last 7 years. Although not hypermetabolic, the appearance is concerning for the possibility of a low grade adenocarcinoma. 4. Other imaging findings of potential clinical significance: Chronic bilateral maxillary sinusitis. Coronary, aortic arch, and branch vessel atherosclerotic vascular disease. Aortoiliac atherosclerotic vascular disease. Cholelithiasis. Sigmoid colon diverticulosis. Lumbar scoliosis.   07/11/2015 Imaging   Ct scan of chest, abdomen and pelvis: 1. 13 mm mixed solid and sub solid nodule in the posterior right lower lobe was present in 2009 and has clearly progressed in the interval since that study. Imaging features remain highly concerning for low-grade or well differentiated adenocarcinoma. 2. Slight increase size of in the hypermetabolic, rim enhancing nodule identified adjacent to the left L3-4 neural foramen. Metastatic disease remains a concern. 3. No evidence for discrete soft tissue lesion between the left L1 and 2 transverse processes, the site of focal FDG uptake on the recent PET-CT. 4. Cholelithiasis. 5. Abdominal aortic atherosclerosis   08/03/2015 - 01/16/2016 Chemotherapy   She received carboplatin and Taxol   11/06/2015 PET scan   1. Hypermetabolic left L3-4 paraspinous metastasis (biopsy-proven) is stable in size and slightly decreased in metabolism. 2. Previously  described small focus of hypermetabolism between the left L2 and L3 spinous processes has resolved. 3. New mild linear hypermetabolism to the left of the T11-12 spinous processes without discrete mass on the CT images, favor benign activity related activity, recommend attention on follow-up PET-CT.  4. No definite new sites of hypermetabolic metastatic disease. No recurrent hypermetabolic metastatic disease in the pelvis. 5. Interval stability of subsolid 1.3 cm superior segment right lower lobe pulmonary nodule without associated significant metabolism, which has grown compared to the 2009 chest CT study, and remain suspicious for low grade adenocarcinoma. 6. Additional findings include aortic atherosclerosis, coronary atherosclerosis, mild multinodular goiter with no hypermetabolic thyroid nodules, cholelithiasis and moderate sigmoid diverticulosis.   02/12/2016 PET scan   1. Interval increase in size and metabolic activity of the LEFT paraspinal soft tissue metastasis. 2. New activity within the musculature of the LEFT chest wall. Given the unusual location of the paraspinal metastasis cannot exclude a second soft tissue metastasis to the musculature however favor benign physiologic activity. 3. Stable RIGHT lower  lobe pulmonary nodule. 4. No evidence of local recurrence.     04/08/2016 Imaging   Ct scan of chest, abdomen and pelvis: 1. Similar size of a  left paravertebral abdominal lesion. 2. No new or progressive metastatic disease. 3. No correlate for the muscular activity about the lateral left chest wall. 4.  Coronary artery atherosclerosis. Aortic atherosclerosis. 5. Cholelithiasis. 6. Similar right lower lobe pulmonary nodule.   06/03/2016 Imaging   CT abdomen and pelvis 1. Similar to mild enlargement of a paravertebral soft tissue lesion at the L3-4 level. 2. No new sites of disease identified. 3. Cholelithiasis. 4. Hysterectomy. 5.  Aortic atherosclerosis.    09/02/2016  Imaging   CT abdomen and pelvis 1. Continue mild interval increase in size of LEFT paraspinal mass (1-2 mm). 2. No evidence of new metastatic disease in the abdomen pelvis. 3. Post hysterectomy anatomy   09/26/2016 Imaging   MR lumbar spine 1. Left paraspinal metastasis with epicenter adjacent to the left L3 neural foramen does extend into the foramen to the level of the left lateral epidural space (series 9, image 31), but also tracks cephalad and caudal along the L2-L4 lumbar plexus (series 10, image 15). No extension into the left L2 or L4 neural foramina. And no more distal extension of tumor. 2. Abnormal signal in the medial left psoas and ventral left erector spinae muscles at L2 and L3 is probably denervation related. 3. No bone invasion or osseous metastatic disease. Suspect previous radiation of the L2 through L5 spinal levels. 4. No dural or intradural metastatic disease.    10/22/2016 Procedure   She underwent stereotactic Radiosurgery   10/30/2016 Genetic Testing   Patient has genetic testing done for Inheritable genetic mutation panel. Results revealed patient has no actionable mutation.   01/21/2017 Imaging   1. Reduced size and conspicuity of the left paraspinal mass at the L3-4 level. The mass still present but has enhancement similar to that of adjacent psoas musculature. 2. No new metastatic disease is identified. 3. Other imaging findings of potential clinical significance: Cholelithiasis. Aortic Atherosclerosis (ICD10-I70.0). Sigmoid diverticulosis. Lumbar scoliosis.   05/09/2017 Imaging   Ct scan of abdomen and pelvis 1. Paraspinal mass adjacent to the left side of L3-L4 is less distinct than prior examinations, and is therefore difficult to discretely measure, however, the overall appearance suggests continued positive response to therapy. 2. No new signs of metastatic disease elsewhere in the abdomen or pelvis. 3. Aortic atherosclerosis, in addition to at least right  coronary artery disease. Assessment for potential risk factor modification, dietary therapy or pharmacologic therapy may be warranted, if clinically indicated. 4. Colonic diverticulosis without evidence of acute diverticulitis at this time. 5. Additional incidental findings, as above. Aortic Atherosclerosis (ICD10-I70.0).   08/14/2017 Imaging   1. Treated left paraspinal mass centered at L3-4. Mass size is stable but there has been some evolution of tumor characteristics with less central fluid seen today. Recommend continued surveillance. 2. No evidence of untreated metastasis. 3. Degenerative changes and related impingement are described above.   10/28/2017 Imaging   Stable small left paraspinal soft tissue mass. No new or progressive disease within the abdomen or pelvis.  Cholelithiasis.  No radiographic evidence of cholecystitis.  Colonic diverticulosis, without radiographic evidence of diverticulitis.   04/30/2018 Imaging   Stable post treatment change of the partially necrotic LEFT paravertebral mass at L3-L4. 18 x 21 mm cross-section. Mass effect on the LEFT L3 nerve root redemonstrated. Enhancing and atrophic LEFT psoas is inseparable.  10/15/2018 Imaging   1. Stable post treatment size and appearance of partially necrotic left paravertebral mass at L3-4, measuring 19 x 25 mm on current exam (unchanged when measured at similar level on previous study). Mass effect on the adjacent left L3 nerve root is unchanged. Adjacent enhancing and atrophic left psoas muscle. 2. Left convex scoliosis with associated multilevel degenerative spondylolysis and facet arthrosis, unchanged   10/29/2018 Imaging   Stable small left L3 paraspinal soft tissue mass. No evidence of new or progressive metastatic disease, or other acute findings.   Cholelithiasis.  No radiographic evidence of cholecystitis.   Colonic diverticulosis, without radiographic evidence of diverticulitis.   11/11/2018 PET scan   1.  Low level metabolism (max SUV 2.0) associated with the irregular subsolid superior segment right lower lobe 2.1 cm pulmonary nodule. As better depicted on the recent diagnostic chest CT study, this nodule crosses the major fissure into the right upper lobe and has increased in size and density on multiple imaging studies back to 2009. Findings are most suggestive of primary bronchogenic adenocarcinoma. 2. No hypermetabolic thoracic adenopathy. 3. Persistent hypermetabolism (max SUV 8.0) associated with the known left L3-4 paraspinous metastasis, mildly decreased in metabolism since 02/12/2016 PET-CT. 4. No additional sites of hypermetabolic metastatic disease in the abdomen or pelvis. No metabolic evidence of peritoneal recurrence. No ascites. 5. Chronic findings include: Aortic Atherosclerosis (ICD10-I70.0). Coronary atherosclerosis. Chronic bilateral maxillary sinusitis. Cholelithiasis. Moderate sigmoid diverticulosis.   01/07/2019 - 04/04/2019 Chemotherapy   The patient had carboplatin and Alimta for chemotherapy treatment.     05/03/2019 -  Chemotherapy   The patient had Pembrolizumab and Lenvima for chemotherapy treatment.     07/23/2019 PET scan   1. 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 No new sites of hypermetabolic metastatic disease on today's study. 2. Stable appearance of surgical changes in the right lung without features to suggest local recurrence or metastatic lung cancer. 3. Cholelithiasis. 4.  Aortic Atherosclerois (ICD10-170.0)   10/20/2019 Imaging   Bilateral venous Doppler US RIGHT:  - Findings consistent with acute and occlusive deep vein thrombosis involving the right common femoral vein, right external iliac vein, right femoral vein, right popliteal vein, right posterior tibial veins, and right peroneal veins.   Unable to adequately visualize the common iliac veins due to bowel gas. The imaged portions of the IVC  were patent.     LEFT:  - No evidence of common femoral vein obstruction.    11/08/2019 PET scan   1. Further reduction in activity at the left L3 level and left paraspinal tissues at L3. Maximum vertebral SUV currently 3.9, previously 4.8 on 07/23/2019 and previously 10.0 on 04/14/2019. 2. Late phase healing of previous left transverse process fractures at L3 and L4. Suspected healing right sixth rib fracture anteriorly. 3. There is new focal activity in the vicinity of the right internal jugular vein in the lower neck. Maximum SUV in this vicinity is 5.1. I not see a well-defined lymph node are thyroid lesion to corroborate with the focus, but surveillance in this region is suggested. 4. Other imaging findings of potential clinical significance: Aortic Atherosclerosis (ICD10-I70.0). Coronary atherosclerosis. Cholelithiasis. Sigmoid colon diverticulosis.   Metastatic cancer to bone ([place])  06/27/2014 Initial Diagnosis   Metastatic cancer to bone   Primary lung cancer, right ([place])  11/04/2018 Initial Diagnosis   Primary lung cancer, right ([place])   11/11/2018 PET scan   1. Low level metabolism (max SUV 2.0)  associated with the irregular subsolid superior segment right lower lobe 2.1 cm pulmonary nodule. As better depicted on the recent diagnostic chest CT study, this nodule crosses the major fissure into the right upper lobe and has increased in size and density on multiple imaging studies back to 2009. Findings are most suggestive of primary bronchogenic adenocarcinoma. 2. No hypermetabolic thoracic adenopathy. 3. Persistent hypermetabolism (max SUV 8.0) associated with the known left L3-4 paraspinous metastasis, mildly decreased in metabolism since 02/12/2016 PET-CT. 4. No additional sites of hypermetabolic metastatic disease in the abdomen or pelvis. No metabolic evidence of peritoneal recurrence. No ascites. 5. Chronic findings include: Aortic Atherosclerosis (ICD10-I70.0). Coronary  atherosclerosis. Chronic bilateral maxillary sinusitis. Cholelithiasis. Moderate sigmoid diverticulosis.   12/07/2018 Pathology Results   1. Lung, resection (segmental or lobe), Right Lobe Superior with endblock wedge of right upper lobe - INVASIVE ADENOCARCINOMA, MODERATELY DIFFERENTIATED, SPANNING 1.6 CM. - THE SURGICAL RESECTION MARGINS ARE NEGATIVE FOR CARCINOMA. - SEE ONCOLOGY TABLE BELOW. 2. Lymph node, biopsy, Level 9 #1 - THERE IS NO EVIDENCE OF CARCINOMA IN 1 OF 1 LYMPH NODE (0/1). 3. Lymph node, biopsy, Level 9 #2 - THERE IS NO EVIDENCE OF CARCINOMA IN 1 OF 1 LYMPH NODE (0/1). 4. Lymph node, biopsy, Level 7 #1 - METASTATIC CARCINOMA IN 1 OF 1 LYMPH NODE (1/1). 5. Lymph node, biopsy, Level 7 #2 - THERE IS NO EVIDENCE OF CARCINOMA IN 1 OF 1 LYMPH NODE (0/1). 6. Lymph node, biopsy, Level 12 #1 - THERE IS NO EVIDENCE OF CARCINOMA IN 1 OF 1 LYMPH NODE (0/1). 7. Lymph node, biopsy, Level 12 #2 - THERE IS NO EVIDENCE OF CARCINOMA IN 1 OF 1 LYMPH NODE (0/1). 8. Lymph node, biopsy, Level 10 #1 - THERE IS NO EVIDENCE OF CARCINOMA IN 1 OF 1 LYMPH NODE (0/1). 9. Lymph node, biopsy, Level 4R #1 - THERE IS NO EVIDENCE OF CARCINOMA IN 1 OF 1 LYMPH NODE (0/1). 10. Lymph node, biopsy, Level 4R #2 - THERE IS NO EVIDENCE OF CARCINOMA IN 1 OF 1 LYMPH NODE (0/1). 11. Lymph node, biopsy, Level 2R #1 - THERE IS NO EVIDENCE OF CARCINOMA IN 1 OF 1 LYMPH NODE (0/1). 12. Lung, resection (segmental or lobe), Right Lobe Superior - BENIGN LUNG PARENCHYMA. - THERE IS NO EVIDENCE OF MALIGNANCY. Procedure: Segmentectomy. Specimen Laterality: Right. Tumor Site: Right superior lobe. Tumor Size: 1.6 cm (gross measurement). Tumor Focality: Unifocal. Histologic Type: Adenocarcinoma, moderately differentiated. Visceral Pleura Invasion: Not identified. Lymphovascular Invasion: Not identified. Direct Invasion of Adjacent Structures: Not identified. Margins: Negative for carcinoma. Treatment Effect:  N/A Regional Lymph Nodes: Number of Lymph Nodes Involved: 1 (level 7) Number of Lymph Nodes Examined: 10 Pathologic Stage Classification (pTNM, AJCC 8th Edition): pT1b, pN2 Ancillary Studies: The tumor cells are positive for Napsin-A, TTF-1, and p53. They are essentially negative for estrogen receptor, PAX-8 and progesterone receptor. Additional studies can be performed upon clinician request. Representative Tumor Block: 1D-1E. (JBK:gt, 12/09/18)   12/07/2018 Surgery   PREOPERATIVE DIAGNOSIS:  Right lung nodule.   POSTOPERATIVE DIAGNOSIS:  Non-small cell carcinoma- suspected primary lung carcinoma, clinical stage T2N01b.   PROCEDURE:   Right video-assisted thoracoscopy, Right lower lobe superior segmentectomy with en bloc wedge resection of right upper lobe, Lymph node dissection, Intercostal nerve block levels 3-9.   SURGEON:  Charlett Lango, MD   FINDINGS:  Mass palpable in posterior aspect of superior segment of right lower lobe, likely involvement across fissure into the upper lobe.  Frozen section revealed non-small cell carcinoma. upper  and lower lobe margins clear.   12/23/2018 Cancer Staging   Staging form: Lung, AJCC 8th Edition - Pathologic: Stage IIIA (pT1b, pN2, cM0) - Signed by Heath Lark, MD on 12/23/2018   01/05/2019 Imaging   MRI brain 1. No metastatic disease or acute intracranial abnormality identified. 2. Small chronic parafalcine meningioma size is stable since that described in 2006 (12 x 15 mm). 3. Advanced signal changes in the cerebral white matter and to a lesser extent pons, nonspecific but most commonly due to chronic small vessel disease. 4. Partially visible degenerative cervical spinal stenosis.     01/07/2019 -  Chemotherapy   The patient had carboplatin and Alimta for chemotherapy treatment.     04/14/2019 PET scan   1. 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 As shown on recent MRI of 04/01/2019, there is a new  component of invasion into the left lower L3 vertebral body. This new component has a maximum SUV of 10.0, compatible with active malignancy. 2. Interval wedge resection of portions of the right upper lobe and right lower lobe at the site of the prior nodule. There is only minimal low-grade activity along the wedge resection clips, maximum SUV of 1.5. Surveillance is likely warranted. No new nodule identified. 3. Other imaging findings of potential clinical significance: Aortic Atherosclerosis (ICD10-I70.0). Coronary atherosclerosis. Thoracolumbar scoliosis. Cholelithiasis. Sigmoid colon diverticulosis.     05/03/2019 -  Chemotherapy   The patient had Pembrolizumab and Lenvima for chemotherapy treatment.     05/22/2019 Imaging   Ct head Atrophy, chronic microvascular disease.   No acute intracranial abnormality.       REVIEW OF SYSTEMS:   Constitutional: Denies fevers, chills Eyes: Denies blurriness of vision Ears, nose, mouth, throat, and face: Denies mucositis or sore throat Respiratory: Denies cough, dyspnea or wheezes Cardiovascular: Denies palpitation, chest discomfort or lower extremity swelling Gastrointestinal:  Denies nausea, heartburn or change in bowel habits Skin: Denies abnormal skin rashes Lymphatics: Denies new lymphadenopathy or easy bruising Neurological:Denies numbness, tingling or new weaknesses Behavioral/Psych: Mood is stable, no new changes  All other systems were reviewed with the patient and are negative.  I have reviewed the past medical history, past surgical history, social history and family history with the patient and they are unchanged from previous note.  ALLERGIES:  is allergic to ace inhibitors, dilaudid [hydromorphone hcl], and codeine.  MEDICATIONS:  Current Outpatient Medications  Medication Sig Dispense Refill  . amLODipine (NORVASC) 5 MG tablet Take 1 tablet (5 mg total) by mouth daily. 30 tablet 3  . atenolol (TENORMIN) 25 MG tablet Take 25  mg by mouth 2 (two) times daily.     Marland Kitchen HYDROcodone-acetaminophen (NORCO/VICODIN) 5-325 MG tablet Take 1 tablet by mouth every 4 (four) hours as needed. 12 tablet 0  . lenvatinib 10 mg daily dose (LENVIMA, 10 MG DAILY DOSE,) capsule Take 1 capsule (10 mg total) by mouth daily. 30 capsule 11  . lidocaine-prilocaine (EMLA) cream Apply to [facility] cath 1-2 hours prior to access as directed 30 g 1  . LORazepam (ATIVAN) 0.5 MG tablet 1 tablet po 30 minutes prior to radiation or MRI 30 tablet 0  . mirtazapine (REMERON) 7.5 MG tablet Take 1 tablet (7.5 mg total) by mouth at bedtime. 30 tablet 3  . Multiple Vitamin (MULTIVITAMIN WITH MINERALS) TABS tablet Take 1 tablet by mouth daily.    . rivaroxaban (XARELTO) 10 MG TABS tablet Take 1 tablet (10 mg total) by mouth daily. 30 tablet 11  .  simvastatin (ZOCOR) 20 MG tablet Take 20 mg by mouth every evening.      No current facility-administered medications for this visit.   Facility-Administered Medications Ordered in Other Visits  Medication Dose Route Frequency Provider Last Rate Last Admin  . heparin lock flush 100 unit/mL  500 Units Intracatheter Once Alvy Bimler, Minard Millirons, MD      . heparin lock flush 100 unit/mL  500 Units Intracatheter Once PRN Alvy Bimler, Roselee Tayloe, MD      . pembrolizumab (KEYTRUDA) 200 mg in sodium chloride 0.9 % 50 mL chemo infusion  200 mg Intravenous Once Domonik Levario, MD      . sodium chloride flush (NS) 0.9 % injection 10 mL  10 mL Intracatheter Once Bina Veenstra, MD      . sodium chloride flush (NS) 0.9 % injection 10 mL  10 mL Intracatheter PRN Alvy Bimler, Kaiyon Hynes, MD        PHYSICAL EXAMINATION: ECOG PERFORMANCE STATUS: 1 - Symptomatic but completely ambulatory  Vitals:   11/29/19 0956  BP: (!) 145/72  Pulse: 80  Resp: 18  Temp: 98.2 F (36.8 C)  SpO2: 100%   Filed Weights   11/29/19 0956  Weight: 91 lb 6.4 oz (41.5 kg)    GENERAL:alert, no distress and comfortable.  She looks thin and cachectic NEURO: alert & oriented x 3 with fluent  speech, no focal motor/sensory deficits  LABORATORY DATA:  I have reviewed the data as listed    Component Value Date/Time   NA 142 11/29/2019 0925   NA 140 05/09/2017 0844   K 4.2 11/29/2019 0925   K 4.1 05/09/2017 0844   CL 111 11/29/2019 0925   CO2 19 (L) 11/29/2019 0925   CO2 26 05/09/2017 0844   GLUCOSE 107 (H) 11/29/2019 0925   GLUCOSE 82 05/09/2017 0844   BUN 36 (H) 11/29/2019 0925   BUN 20.7 05/09/2017 0844   CREATININE 1.07 (H) 11/29/2019 0925   CREATININE 0.7 05/09/2017 0844   CALCIUM 9.4 11/29/2019 0925   CALCIUM 9.3 05/09/2017 0844   PROT 6.8 11/29/2019 0925   PROT 6.8 05/09/2017 0844   ALBUMIN 3.3 (L) 11/29/2019 0925   ALBUMIN 3.6 05/09/2017 0844   AST 30 11/29/2019 0925   AST 27 05/09/2017 0844   ALT 25 11/29/2019 0925   ALT 17 05/09/2017 0844   ALKPHOS 55 11/29/2019 0925   ALKPHOS 43 05/09/2017 0844   BILITOT 0.6 11/29/2019 0925   BILITOT 0.81 05/09/2017 0844   GFRNONAA 49 (L) 11/29/2019 0925   GFRAA 57 (L) 11/29/2019 0925    No results found for: SPEP, UPEP  Lab Results  Component Value Date   WBC 6.1 11/29/2019   NEUTROABS 4.0 11/29/2019   HGB 9.7 (L) 11/29/2019   HCT 31.1 (L) 11/29/2019   MCV 101.3 (H) 11/29/2019   PLT 194 11/29/2019      Chemistry      Component Value Date/Time   NA 142 11/29/2019 0925   NA 140 05/09/2017 0844   K 4.2 11/29/2019 0925   K 4.1 05/09/2017 0844   CL 111 11/29/2019 0925   CO2 19 (L) 11/29/2019 0925   CO2 26 05/09/2017 0844   BUN 36 (H) 11/29/2019 0925   BUN 20.7 05/09/2017 0844   CREATININE 1.07 (H) 11/29/2019 0925   CREATININE 0.7 05/09/2017 0844      Component Value Date/Time   CALCIUM 9.4 11/29/2019 0925   CALCIUM 9.3 05/09/2017 0844   ALKPHOS 55 11/29/2019 0925   ALKPHOS 43 05/09/2017 0844  AST 30 11/29/2019 0925   AST 27 05/09/2017 0844   ALT 25 11/29/2019 0925   ALT 17 05/09/2017 0844   BILITOT 0.6 11/29/2019 0925   BILITOT 0.81 05/09/2017 0844       RADIOGRAPHIC STUDIES: I have  personally reviewed the radiological images as listed and agreed with the findings in the report. NM PET Image Restag (PS) Skull Base To Thigh  Result Date: 11/08/2019 CLINICAL DATA:  Subsequent treatment strategy for non-small cell lung cancer. EXAM: NUCLEAR MEDICINE PET SKULL BASE TO THIGH TECHNIQUE: 5.0 mCi F-18 FDG was injected intravenously. Full-ring PET imaging was performed from the skull base to thigh after the radiotracer. CT data was obtained and used for attenuation correction and anatomic localization. Fasting blood glucose: 95 mg/dl COMPARISON:  Multiple exams, including PET-CT 07/23/2019 FINDINGS: Mediastinal blood pool activity: SUV max 2.2 Liver activity: SUV max N/A NECK: Physiologic muscular activity in the neck. Activity in the vicinity of the right internal jugular vein observed with maximum SUV 5.1, but I do not see a well-defined lymph node or thyroid lesion to corroborate with this small focus of activity on the CT data. Incidental CT findings: Bilateral common carotid atherosclerotic calcification. CHEST: Physiologic muscular activity asymmetric to the left. Incidental CT findings: Coronary, aortic arch, and branch vessel atherosclerotic vascular disease. Postoperative findings in the right lung. Scarring in the lingula. Right Port-A-Cath tip: SVC. ABDOMEN/PELVIS: Physiologic activity in bowel. Incidental CT findings: Cholelithiasis. Aortoiliac atherosclerotic vascular disease. Sigmoid diverticulosis. SKELETON: Activity eccentric to the left at the L3 level has maximum SUV of 3.9, formerly 4.8. There is previously a notable left paravertebral component along the posterior margin of the left psoas with maximum SUV of 7.9 on to 04/14/2019 and 2.7 on 07/23/2019, currently 1.4. Faint sclerosis anteriorly in right sixth rib without significant hypermetabolic activity, probably a healing fracture. Incidental CT findings: Considerable levoconvex thoracolumbar scoliosis with rotary component.  Previous left transverse process fractures at L3 and L4. IMPRESSION: 1. Further reduction in activity at the left L3 level and left paraspinal tissues at L3. Maximum vertebral SUV currently 3.9, previously 4.8 on 07/23/2019 and previously 10.0 on 04/14/2019. 2. Late phase healing of previous left transverse process fractures at L3 and L4. Suspected healing right sixth rib fracture anteriorly. 3. There is new focal activity in the vicinity of the right internal jugular vein in the lower neck. Maximum SUV in this vicinity is 5.1. I not see a well-defined lymph node are thyroid lesion to corroborate with the focus, but surveillance in this region is suggested. 4. Other imaging findings of potential clinical significance: Aortic Atherosclerosis (ICD10-I70.0). Coronary atherosclerosis. Cholelithiasis. Sigmoid colon diverticulosis. Electronically Signed   By: Van Clines M.D.   On: 11/08/2019 11:39

## 2019-11-29 NOTE — Assessment & Plan Note (Signed)
She has no signs or symptoms of relapse in her lung We will continue serial imaging study to follow

## 2019-11-29 NOTE — Assessment & Plan Note (Signed)
She has recurrent epistaxis while on Xarelto The dose is reduced to 10 mg She is no longer taking aspirin I recommend she try a little bit of Vaseline at the tips of her nasal passages to avoid it getting to dry that could precipitate recurrent nosebleeds I also recommend she checks her blood pressure twice a day in case elevated blood pressure is a contributing factor to her recurrent bleeding

## 2019-11-29 NOTE — Patient Instructions (Signed)

## 2019-11-29 NOTE — Assessment & Plan Note (Signed)
She continues to complain of fatigue Her TSH is suppressed She is not on Synthroid right now Continue close monitoring

## 2019-11-29 NOTE — Assessment & Plan Note (Signed)
She is on anticoagulation therapy due to diagnosis of blood clot The patient is currently taking reduced dose Xarelto I verify that she has stopped aspirin therapy She will need to be on anticoagulation therapy for minimum of 3 months

## 2019-11-29 NOTE — Assessment & Plan Note (Signed)
She continues to have progressive weight loss According to her husband, the patient is eating well I recommend calorie count and documentation of her food intake over the next 3 months

## 2019-11-29 NOTE — Patient Instructions (Signed)
[facility] Discharge Instructions for Patients Receiving Chemotherapy  Today you received the following chemotherapy agents :  Keytruda.  To help prevent nausea and vomiting after your treatment, we encourage you to take your nausea medication as prescribed.   If you develop nausea and vomiting that is not controlled by your nausea medication, call the clinic.   BELOW ARE SYMPTOMS THAT SHOULD BE REPORTED IMMEDIATELY:  *FEVER GREATER THAN 100.5 F  *CHILLS WITH OR WITHOUT FEVER  NAUSEA AND VOMITING THAT IS NOT CONTROLLED WITH YOUR NAUSEA MEDICATION  *UNUSUAL SHORTNESS OF BREATH  *UNUSUAL BRUISING OR BLEEDING  TENDERNESS IN MOUTH AND THROAT WITH OR WITHOUT PRESENCE OF ULCERS  *URINARY PROBLEMS  *BOWEL PROBLEMS  UNUSUAL RASH Items with * indicate a potential emergency and should be followed up as soon as possible.  Feel free to call the clinic should you have any questions or concerns. The clinic phone number is [phone].  Please show the CHEMO ALERT CARD at check-in to the Emergency Department and triage nurse.  

## 2019-11-29 NOTE — Assessment & Plan Note (Signed)
She has positive response to therapy based on recent imaging The plan will be to continue treatment indefinitely I will see her back again in 3 weeks for further follow-up Her next PET CT scan will not be due until the end of the year

## 2019-11-29 NOTE — Assessment & Plan Note (Signed)
She has intermittent elevated blood pressure I recommend she document her blood pressure twice a day

## 2019-11-29 NOTE — Assessment & Plan Note (Signed)
She has clear signs of memory impairments There were several instances that the patient cannot recall discussions I recommend her husband to come with her with each visit I recommend neurology consult to test her memory as I believe the patient might have mild cognitive impairment

## 2019-11-30 ENCOUNTER — Other Ambulatory Visit: Payer: Self-pay | Admitting: Hematology and Oncology

## 2019-12-02 ENCOUNTER — Ambulatory Visit (INDEPENDENT_AMBULATORY_CARE_PROVIDER_SITE_OTHER): Payer: Medicare Other | Admitting: Neurology

## 2019-12-02 ENCOUNTER — Other Ambulatory Visit: Payer: Self-pay

## 2019-12-02 ENCOUNTER — Encounter: Payer: Self-pay | Admitting: Neurology

## 2019-12-02 VITALS — BP 151/86 | HR 102 | Ht <= 58 in | Wt 90.0 lb

## 2019-12-02 DIAGNOSIS — E538 Deficiency of other specified B group vitamins: Secondary | ICD-10-CM | POA: Diagnosis not present

## 2019-12-02 DIAGNOSIS — R413 Other amnesia: Secondary | ICD-10-CM | POA: Diagnosis not present

## 2019-12-02 MED ORDER — MEMANTINE HCL 5 MG PO TABS
ORAL_TABLET | ORAL | 0 refills | Status: DC
Start: 2019-12-02 — End: 2019-12-27

## 2019-12-02 NOTE — Progress Notes (Signed)
Reason for visit: Memory disturbance  Referring physician: Dr. [name] is a 79 y.o. female  History of present illness:  [name] is a 79 year old right-handed white female with a history of endometrial cancer and lung cancer.  The patient over the last year has had significant issues with maintaining her appetite, she has had a 20 pound weight loss.  She also has been recently placed on Xarelto for a deep venous thrombosis.  She has made good efforts to try to increase her dietary intake to gain weight.  She has had some chronic issues with diarrhea.  Over the last year, she has also had some gradual change in her memory, this is confirmed by her husband who is present today.  The patient has had some short-term memory issues, she has difficulty recalling recent events.  She is keeping up with her own medications and appointments, she does operate a motor vehicle without difficulty.  She has not given up any activities of daily living because of her memory.  She is able to cook and manage the kitchen fairly well.  She does not do the finances and never has.  She sleeps fairly well at night but does have a low energy level during the day.  She has some urinary urgency as well as the diarrhea, she also reports some troubles with balance but she has not had any falls.  She denies any family history of individuals with memory problems as they got older.  She does misplace things about the house frequently.  She has undergone a CT scan of the brain recently that shows extensive white matter changes that are chronic in nature.  She comes to this office for further evaluation.  Past Medical History:  Diagnosis Date  . Endometrial cancer ([place]) 12/2007   s/p total abdominal hysterectomy at [facility] - ovaries were also removed   . Family history of breast cancer   . History of breast cancer    right breast -- treated with lumpectomy and radiation therarpy, postoperatively with  tamoxifen   . History of radiation therapy 10/22/2016 to 10/28/2016  . Hypercholesterolemia   . Hyperlipidemia   . Hypertension   . Palpitations   . Personal history of radiation therapy 2006  . PVC's (premature ventricular contractions)   . Radiation 07/11/14-08/12/14   left lumbar paraspinal area 55 gray  . S/P radiation therapy 10/28/11-12/05/11   5040 cGy left pelvis  . S/P radiation therapy    Intracavitary brachytherapy of uterus    Past Surgical History:  Procedure Laterality Date  . APPENDECTOMY    . BREAST LUMPECTOMY Right 2000  . COLONOSCOPY     3X  . FOOT SURGERY     RIGHT  . SEGMENTECOMY Right 12/07/2018   Procedure: RIGHT LOWER LOBE SUPERIOR SEGMENTECTOMY;  Surgeon: Melrose Nakayama, MD;  Location: [place];  Service: Thoracic;  Laterality: Right;  . TONSILLECTOMY    . TOTAL ABDOMINAL HYSTERECTOMY W/ BILATERAL SALPINGOOPHORECTOMY    . VIDEO ASSISTED THORACOSCOPY Right 12/07/2018   Procedure: VIDEO ASSISTED THORACOSCOPY;  Surgeon: Melrose Nakayama, MD;  Location: [facility] OR;  Service: Thoracic;  Laterality: Right;    Family History  Problem Relation Age of Onset  . Thrombosis Father        coronary thrombosis  . Hypertension Father   . Heart failure Mother   . Coronary artery disease Brother   . Breast cancer Maternal Aunt  dx in her 43s  . Stroke Maternal Uncle   . Stroke Maternal Grandfather     Social history:  reports that she quit smoking about 57 years ago. Her smoking use included cigarettes. She has never used smokeless tobacco. She reports current alcohol use. She reports that she does not use drugs.  Medications:  Prior to Admission medications   Medication Sig Start Date End Date Taking? Authorizing Provider  amLODipine (NORVASC) 5 MG tablet Take 1 tablet (5 mg total) by mouth daily. 10/04/19  Yes Gorsuch, Ni, MD  atenolol (TENORMIN) 25 MG tablet Take 25 mg by mouth 2 (two) times daily.  01/08/11  Yes Darlin Coco, MD    HYDROcodone-acetaminophen (NORCO/VICODIN) 5-325 MG tablet Take 1 tablet by mouth every 4 (four) hours as needed. 11/17/19  Yes Daleen Bo, MD  lenvatinib 10 mg daily dose (LENVIMA, 10 MG DAILY DOSE,) capsule Take 1 capsule (10 mg total) by mouth daily. 04/19/19  Yes Gorsuch, Ernst Spell, MD  lidocaine-prilocaine (EMLA) cream Apply to [facility] cath 1-2 hours prior to access as directed 09/12/15  Yes Livesay, Lennis P, MD  LORazepam (ATIVAN) 0.5 MG tablet 1 tablet po 30 minutes prior to radiation or MRI 10/08/16  Yes Hayden Pedro, PA-C  mirtazapine (REMERON) 7.5 MG tablet TAKE 1 TABLET (7.5 MG TOTAL) BY MOUTH AT BEDTIME. 11/30/19  Yes Gorsuch, Ni, MD  Multiple Vitamin (MULTIVITAMIN WITH MINERALS) TABS tablet Take 1 tablet by mouth daily.   Yes [provider]  rivaroxaban (XARELTO) 10 MG TABS tablet Take 1 tablet (10 mg total) by mouth daily. 11/08/19  Yes Gorsuch, Ni, MD  simvastatin (ZOCOR) 20 MG tablet Take 20 mg by mouth every evening.    Yes [provider]      Allergies  Allergen Reactions  . Ace Inhibitors Anaphylaxis    Shortness of breath   . Dilaudid [Hydromorphone Hcl] Other (See Comments)    "total loss of her mind"  . Codeine Nausea Only    ROS:  Out of a complete 14 system review of symptoms, the patient complains only of the following symptoms, and all other reviewed systems are negative.  Memory problems Fatigue Balance issues  Blood pressure (!) 151/86, pulse (!) 102, height 4\' 10"  (1.473 m), weight 90 lb (40.8 kg).  Physical Exam  General: The patient is alert and cooperative at the time of the examination.  Eyes: Pupils are equal, round, and reactive to light. Discs are flat bilaterally.  Neck: The neck is supple, no carotid bruits are noted.  Respiratory: The respiratory examination is clear.  Cardiovascular: The cardiovascular examination reveals a regular rate and rhythm, no obvious murmurs or rubs are noted.  Neuromuscular: With sitting,  the right shoulder is down as compared to the left, the patient has significant scoliosis  Skin: Extremities are without significant edema.  Neurologic Exam  Mental status: The patient is alert and oriented x 3 at the time of the examination. The Mini-Mental status examination done today shows a total score 26/30.  Cranial nerves: Facial symmetry is present. There is good sensation of the face to pinprick and soft touch bilaterally. The strength of the facial muscles and the muscles to head turning and shoulder shrug are normal bilaterally. Speech is well enunciated, no aphasia or dysarthria is noted. Extraocular movements are full. Visual fields are full. The tongue is midline, and the patient has symmetric elevation of the soft palate. No obvious hearing deficits are noted.  Motor: The motor testing reveals 5  over 5 strength of all 4 extremities, with exception of 4/5 strength with hip flexion on the left. Good symmetric motor tone is noted throughout.  Sensory: Sensory testing is intact to pinprick, soft touch, vibration sensation, and position sense on all 4 extremities. No evidence of extinction is noted.  Coordination: Cerebellar testing reveals good finger-nose-finger and heel-to-shin bilaterally.  Gait and station: The patient is able to walk independently, she does not use a cane.  Tandem gait is unsteady.  Romberg is negative but is slightly unsteady.  Reflexes: Deep tendon reflexes are symmetric and normal bilaterally, with exception that the left knee jerk reflex is absent and there is trace reflexes on the ankles bilaterally. Toes are downgoing bilaterally.   CT head 05/22/19:  IMPRESSION: Atrophy, chronic microvascular disease.  No acute intracranial abnormality.  * CT scan images were reviewed online. I agree with the written report.    Assessment/Plan:  1.  Mild cognitive impairment  2.  History of endometrial and lung cancer  3.  Weight loss  4.  Extensive  small vessel disease by CT brain  The patient does appear to have a significant degree of small vessel disease which may be a contributing factor for the memory and balance issues.  The patient is on Xarelto currently, when she comes off this medication would recommend ongoing treatment with aspirin.  The patient will undergo blood work today.  She will be started on Namenda.  Will not consider Aricept given the issues with weight loss and chronic diarrhea.  The patient will follow up here in 6 months.  Jill Alexanders MD 12/02/2019 9:36 AM  [facility] [address] [place],  [address]  Phone [phone] Fax [phone]

## 2019-12-07 ENCOUNTER — Other Ambulatory Visit: Payer: Self-pay | Admitting: Hematology and Oncology

## 2019-12-07 MED ORDER — LENVIMA (10 MG DAILY DOSE) 10 MG PO CPPK: 10.0000 mg | ORAL_CAPSULE | Freq: Every day | ORAL | 11 refills | Status: DC

## 2019-12-08 LAB — VITAMIN B12: Vitamin B-12: 547 pg/mL (ref 232–1245)

## 2019-12-08 LAB — SEDIMENTATION RATE: Sed Rate: 39 mm/hr (ref 0–40)

## 2019-12-08 LAB — RPR: RPR Ser Ql: NONREACTIVE

## 2019-12-08 LAB — VITAMIN B1: Thiamine: 182 nmol/L (ref 66.5–200.0)

## 2019-12-13 ENCOUNTER — Telehealth: Payer: Self-pay

## 2019-12-13 NOTE — Telephone Encounter (Signed)
Husband called and left a message that [name] is having a lot of weakness.  Called back, Husband is out of the house running a errand. Spoke with Kennyth Lose. She saw Dr. Jannifer Franklin on 7/22 and started Namenda. She started having severe back pain over the weekend on Saturday and is taking Oxcodone 10 mg prn.She has not taken anything today for back pain. She has tried heat to her back and tylenol with no relief. She has a history of back pain/issues. She is complaining of weakness that started on Saturday also.

## 2019-12-13 NOTE — Telephone Encounter (Signed)
She called back and left a message. She is able to come in at the scheduled appt this Thursday at 9 am.

## 2019-12-13 NOTE — Telephone Encounter (Signed)
1) take pain medicine for pain 2) check BP twice a day I can see her Thursday with her husband in the morning and review all her meds. Pls schedule

## 2019-12-13 NOTE — Telephone Encounter (Signed)
Called and given below message. She verbalized understanding. Scheduled appt with Dr. Alvy Bimler for 8/5 at 0900, instructed to arrive 15 mins prior to appt. She is aware of appts. She will call back if the time does not work for her husband.

## 2019-12-16 ENCOUNTER — Inpatient Hospital Stay: Payer: Medicare Other | Attending: Hematology and Oncology | Admitting: Hematology and Oncology

## 2019-12-16 ENCOUNTER — Other Ambulatory Visit: Payer: Self-pay

## 2019-12-16 ENCOUNTER — Encounter: Payer: Self-pay | Admitting: Hematology and Oncology

## 2019-12-16 VITALS — BP 161/77 | HR 77 | Temp 98.4°F | Resp 16 | Wt 90.8 lb

## 2019-12-16 DIAGNOSIS — C3411 Malignant neoplasm of upper lobe, right bronchus or lung: Secondary | ICD-10-CM | POA: Diagnosis not present

## 2019-12-16 DIAGNOSIS — D649 Anemia, unspecified: Secondary | ICD-10-CM | POA: Insufficient documentation

## 2019-12-16 DIAGNOSIS — R197 Diarrhea, unspecified: Secondary | ICD-10-CM | POA: Diagnosis not present

## 2019-12-16 DIAGNOSIS — Z9221 Personal history of antineoplastic chemotherapy: Secondary | ICD-10-CM | POA: Diagnosis not present

## 2019-12-16 DIAGNOSIS — C7951 Secondary malignant neoplasm of bone: Secondary | ICD-10-CM | POA: Diagnosis not present

## 2019-12-16 DIAGNOSIS — K8020 Calculus of gallbladder without cholecystitis without obstruction: Secondary | ICD-10-CM | POA: Diagnosis not present

## 2019-12-16 DIAGNOSIS — C541 Malignant neoplasm of endometrium: Secondary | ICD-10-CM | POA: Diagnosis not present

## 2019-12-16 DIAGNOSIS — I700 Atherosclerosis of aorta: Secondary | ICD-10-CM | POA: Diagnosis not present

## 2019-12-16 DIAGNOSIS — Z885 Allergy status to narcotic agent status: Secondary | ICD-10-CM | POA: Insufficient documentation

## 2019-12-16 DIAGNOSIS — C779 Secondary and unspecified malignant neoplasm of lymph node, unspecified: Secondary | ICD-10-CM | POA: Insufficient documentation

## 2019-12-16 DIAGNOSIS — L03116 Cellulitis of left lower limb: Secondary | ICD-10-CM | POA: Diagnosis not present

## 2019-12-16 DIAGNOSIS — K5730 Diverticulosis of large intestine without perforation or abscess without bleeding: Secondary | ICD-10-CM | POA: Diagnosis not present

## 2019-12-16 DIAGNOSIS — G3184 Mild cognitive impairment, so stated: Secondary | ICD-10-CM | POA: Diagnosis not present

## 2019-12-16 DIAGNOSIS — J320 Chronic maxillary sinusitis: Secondary | ICD-10-CM | POA: Diagnosis not present

## 2019-12-16 DIAGNOSIS — Z79899 Other long term (current) drug therapy: Secondary | ICD-10-CM | POA: Insufficient documentation

## 2019-12-16 DIAGNOSIS — Z923 Personal history of irradiation: Secondary | ICD-10-CM | POA: Diagnosis not present

## 2019-12-16 DIAGNOSIS — R600 Localized edema: Secondary | ICD-10-CM | POA: Diagnosis not present

## 2019-12-16 DIAGNOSIS — I2510 Atherosclerotic heart disease of native coronary artery without angina pectoris: Secondary | ICD-10-CM | POA: Diagnosis not present

## 2019-12-16 DIAGNOSIS — Z7901 Long term (current) use of anticoagulants: Secondary | ICD-10-CM | POA: Diagnosis not present

## 2019-12-16 DIAGNOSIS — I10 Essential (primary) hypertension: Secondary | ICD-10-CM | POA: Diagnosis not present

## 2019-12-16 DIAGNOSIS — R64 Cachexia: Secondary | ICD-10-CM | POA: Diagnosis not present

## 2019-12-16 DIAGNOSIS — R413 Other amnesia: Secondary | ICD-10-CM | POA: Diagnosis not present

## 2019-12-16 MED ORDER — PREDNISONE 20 MG PO TABS
20.0000 mg | ORAL_TABLET | Freq: Every day | ORAL | 0 refills | Status: DC
Start: 2019-12-16 — End: 2019-12-28

## 2019-12-16 NOTE — Assessment & Plan Note (Signed)
She is started on medications by neurologist and complained of frequent diarrhea It may or may not be related to side effects of that I recommend the patient to continue close follow-up with neurologist and not stop her medication

## 2019-12-16 NOTE — Assessment & Plan Note (Signed)
She has frequent diarrhea despite multiple medication changes I recommend complete discontinuation of Lenvima She is reminded to take Imodium as needed

## 2019-12-16 NOTE — Assessment & Plan Note (Signed)
Her blood pressure is high again today but according to the patient, blood pressure at home is satisfactory I do not plan to make any medication changes

## 2019-12-16 NOTE — Progress Notes (Signed)
[facility] OFFICE PROGRESS NOTE  Patient Care Team: Shon Baton, MD as PCP - General (Internal Medicine) Heath Lark, MD as Consulting Physician (Hematology and Oncology)  ASSESSMENT & PLAN:  Endometrial cancer ([place]) Overall, she is not improving I recommend we put her treatment on hold and focus on aggressive supportive care I will reschedule her treatment is for next week I recommend holding Lenvima until her next visit It is possible, her failure to thrive, diarrhea and others could be related to side effects of her immunotherapy I also recommend a course of prednisone to see if we can reverse some of the side effects I will see her in 2 weeks for further assessment and follow-up Recommend her husband to join her with each visit so that we can have accurate communication  Essential hypertension Her blood pressure is high again today but according to the patient, blood pressure at home is satisfactory I do not plan to make any medication changes  Memory impairment She is started on medications by neurologist and complained of frequent diarrhea It may or may not be related to side effects of that I recommend the patient to continue close follow-up with neurologist and not stop her medication  Diarrhea She has frequent diarrhea despite multiple medication changes I recommend complete discontinuation of Lenvima She is reminded to take Imodium as needed  Malignant cachexia ([place]) Despite my best effort, she is to lose weight As above, we will hold her treatment I will start her on short course, low-dose prednisone therapy She will continue taking Remeron She is instructed to keep documenting her food intake and bring it to her next visit   No orders of the defined types were placed in this encounter.   All questions were answered. The patient knows to call the clinic with any problems, questions or concerns. The total time spent in the appointment was 30  minutes encounter with patients including review of chart and various tests results, discussions about plan of care and coordination of care plan   Heath Lark, MD 12/16/2019 10:56 AM  INTERVAL HISTORY: Please see below for problem oriented charting. She returns with her husband for further follow-up She continues to lose weight She remains forgetful.  She did not bring her blood pressure documentation from home and her food diary According to the patient, her blood pressure control at home is satisfactory According to the patient and her husband, she is attempting to eat as much as she can Her energy level is poor No new neurological deficits of fall She has frequent loose stool in the evening after her dinner Her husband is wondering whether she is a candidate for TPN and I say no No recent nausea  SUMMARY OF ONCOLOGIC HISTORY: Oncology History Overview Note  Biopsy of recurrence 06-2014 ER negative. PDL1 testing low at 2% on the uterine cancer She progressed on carboplatin and Paclitaxel for uterine cancer HER 2 negative on pathology [id number] Negative genetic testing  On her lung cancer sample, [facility] testing revealed 10% PD-1 positive EGFR mutation was detected, exon 19 deletion   Endometrial cancer ([place])  12/01/2007 Initial Diagnosis   She has recurrent papillary serous endometrial carcinoma. Briefly she was diagnosed in 2009 with a FIGO IB pappilary serous carcinoma treated with hysterectomy followed by adjuvant chemo and vaginal brachytherapy. She then had a recurrence diagnosed in late 2012 that was deemed not to be surgically resectable, and was treated with 6 cycles of carbo/taxol followed by 5040 cGy  of EBRT to the pelvic mass. She was then followed with serial imaging and was noted in June of this year to have slight increase in the size of the mass, and again in September there was small enlargement of the mass. She had a re-staging PET scan on 03/10/13 that showed FDG  activity in the pelvic mass, with no other areas of disease   12/01/2007 Imaging   CT scan of chest, abdomen and pelvis: 1.  Mass like area of decreased attenuation in the central uterus, consistent with the known endometrial carcinoma.  Deep myometrial invasion is suspected.  Pelvic MRI without and with contrast could be performed for further staging workup if clinically warranted. 2.  No evidence of extrauterine extension or lymphadenopathy. 3.  Sigmoid diverticulosis incidentally noted.   12/15/2007 Surgery   --12/2007 laparoscopic hysterectomy and PLND --05/2008 complted 6 cycles adjuvant carbo/taxol followed by vaginal cuff brachy --03/2011 exploratory lararoscopy, ureterolysis --4/13 completed 6 cycels carbotaxol --8/13 completed EBRT to the left pelvic sidewall    06/10/2008 Imaging   CT scan abdomen and pelvis 1.  Nonspecific mildly prominent inguinal lymph nodes, right greater than left. 2.  Interval hysterectomy without evidence of recurrent pelvic mass or fluid collection.   03/22/2011 Imaging   Ct scan abdomen and pelvis 1.  Local uterine cancer recurrence with two large nodes along the left pelvic sidewall. 2.  No evidence of bowel obstruction, urinary obstruction, or more distant metastasis.   03/31/2011 Relapse/Recurrence   chemotherapy and external beam radiation   05/16/2011 - 09/17/2011 Chemotherapy   She had 6 cycles of carboplatin and Taxol    07/17/2011 Imaging   Ct scan abdomen and pelvis 1.  Interval improvement in the previously demonstrated left pelvic local recurrence of tumor. 2.  No disease progression or complication identified. 3.  Mild bladder wall thickening on the right, nonspecific and possibly related to incomplete distension and/or radiation therapy. 4.  Cholelithiasis   10/11/2011 Imaging   CT scan abdomen and pelvis 1.  Interval enlargement of the left pelvic sidewall masses. 2.  No new nodular disease in the pelvis or lymphadenopathy. 3.  No  evidence  of distant metastasis. 4.  Mild hydronephrosis on the right is similar to prior. 5.  Focal thickening of the right aspect the bladder is stable compared to prior.    10/28/2011 - 12/05/2011 Radiation Therapy   radiation for pelvic sidewall recurrence   10/28/2011 - 12/05/2011 Radiation Therapy   10/28/11-12/05/11: Radiotherapy to the left pelvic sidewall   03/18/2012 Imaging   CT abdomen 1.  Today's study demonstrates a positive response to therapy with decreased size of left pelvic sidewall nodal masses, as detailed above.  No new soft tissue masses or new lymphadenopathy is identified within the abdomen or pelvis. Continue attention on follow-up studies is recommended. 2.  Cholelithiasis without findings to suggest acute cholecystitis. 3.  Colonic diverticulosis without findings to suggest acute diverticulitis at this time. 4.  Mild asymmetric urinary bladder wall thickening is unchanged compared to prior examinations and is nonspecific.  No definite bladder wall mass is identified at this time. 5.  Additional findings, similar to prior examinations, as above   06/22/2012 Imaging   CT abdomen 1.  Further decreased size of the left pelvic peripherally enhancing lesion. 2.  No new sites of new or progressive disease. 3. Esophageal air fluid level suggests dysmotility or gastroesophageal reflux. 4.  Cholelithiasis   11/02/2012 Imaging   CT abdomen 1.  The left pelvic side  wall lesion demonstrates mild increase in size from previous exam. 2.  No new sites of new or progressive disease. 3.  Cholelithiasis.    01/28/2013 Imaging   CT abdomen 1. Interval small increase in volume of enhancing mass adjacent to the left pelvic sidewall. 2. No evidence of abdominal or pelvic lymphadenopathy   04/02/2013 Surgery   pelvic mass resection with IORT   04/02/2013 - 04/02/2013 Radiation Therapy   04/02/13: Intraoperative radiotherapy to the left pelvis   05/10/2013 PET scan   1.  Hypermetabolic mass in the deep left pelvis consistent with metastasis. 2. No additional evidence of local metastasis. No evidence of distant metastasis. 3. Small right lower lobe pulmonary nodule is not hypermetabolic.   07/27/2013 Imaging   No evidence of recurrent or metastatic disease. Apparent prior resection of the deep left pelvic metastasis.   02/08/2014 Imaging   No CT findings for recurrent or metastatic disease involving the abdomen/ pelvis   06/13/2014 Relapse/Recurrence   + recurrence paraspinous muscle   06/21/2014 Procedure   There is a soft tissue lesion along the left side of L3-L4. This lesion roughly measures up to 2.2 cm. Needle was positioned along the posterior aspect of the lesion. Small amount of air in the paraspinal tissues following needle removal   06/21/2014 Pathology Results   Bone, biopsy, left lumbar paraspinal - METASTATIC POORLY DIFFERENTIATED CARCINOMA, CONSISTENT WITH HIGH GRADE SEROUS CARCINOMA SEE COMMENT. Microscopic Comment The carcinoma demonstrates the following immunophenotype: Cytokeratin 7 - patchy moderate to strong expression Estrogen receptor - negative expression P53 - strong diffuse expression TTF-1 - negative expression WT-1 - negative expression CD56 - focal moderate strong expression Synaptophysin - negative expression GCDFP - negative expression The history of primary endometrial papillary serous carcinoma and primary mammary carcinoma is noted. In the current case, the overall morphologic and immunophenotype are that of poorly differentiated carcinoma, consistent with high grade serous carcinoma.    07/11/2014 - 08/12/2014 Radiation Therapy   07/11/14-08/12/14: 55 Gy in 25 fractions to the Lumbar spine   08/15/2014 Imaging   CT scan of chest, abdomen and pelvis 1. Since the biopsy study of 06/21/2014, similar to slight decrease in size of a left paraspinous lesion at L3-4. 2. No new sites of metastatic disease identified. 3. 8 mm  ground-glass nodule in the right lower lobe is grossly similar to 03/10/2013, suggesting a benign etiology. 4. Cholelithiasis. 5. Apparent sigmoid colonic wall thickening which could be due to underdistention. Colitis felt less likely. 6.  Atherosclerosis, including within the coronary arteries. 7. Pelvic floor laxity.   10/07/2014 Imaging   CT scan of chest, abdomen and pelvis: Stable to slight interval decrease in size of left paraspinous lesion at L3-4. No new sites of metastatic disease. Unchanged ground-glass nodule within the right lower lobe, potentially benign in etiology. Recommend attention on followup. Cholelithiasis. Sigmoid colonic diverticulosis. No CT evidence to suggest acute diverticulitis.   01/19/2015 Imaging   Ct scan of abdomen and pelvis 1. Decrease in size of left paraspinous metastasis. 2. No new sites of disease. 3. Stable ground-glass attenuating nodule within the superior segment of right lower lobe. 4. Gallstones   05/02/2015 Imaging   Ct abdomen and pelvis: Slight interval increase in size of left lumbar paraspinal soft tissue metastasis. No new sites of metastatic disease identified within the abdomen or pelvis.    05/11/2015 PET scan   1. The small soft tissue mass just lateral to the left L3 for neural foramen is hypermetabolic, favoring malignancy.  2. There is also a small focus of hypermetabolic activity between the left L1 and L2 transverse processes, but without a CT correlate. 3. The 13 mm sub solid nodule in the superior segment right lower lobe is stable from recent exams but has slowly increased in size over the last 7 years. Although not hypermetabolic, the appearance is concerning for the possibility of a low grade adenocarcinoma. 4. Other imaging findings of potential clinical significance: Chronic bilateral maxillary sinusitis. Coronary, aortic arch, and branch vessel atherosclerotic vascular disease. Aortoiliac atherosclerotic vascular disease.  Cholelithiasis. Sigmoid colon diverticulosis. Lumbar scoliosis.   07/11/2015 Imaging   Ct scan of chest, abdomen and pelvis: 1. 13 mm mixed solid and sub solid nodule in the posterior right lower lobe was present in 2009 and has clearly progressed in the interval since that study. Imaging features remain highly concerning for low-grade or well differentiated adenocarcinoma. 2. Slight increase size of in the hypermetabolic, rim enhancing nodule identified adjacent to the left L3-4 neural foramen. Metastatic disease remains a concern. 3. No evidence for discrete soft tissue lesion between the left L1 and 2 transverse processes, the site of focal FDG uptake on the recent PET-CT. 4. Cholelithiasis. 5. Abdominal aortic atherosclerosis   08/03/2015 - 01/16/2016 Chemotherapy   She received carboplatin and Taxol   11/06/2015 PET scan   1. Hypermetabolic left K9-3 paraspinous metastasis (biopsy-proven) is stable in size and slightly decreased in metabolism. 2. Previously described small focus of hypermetabolism between the left L2 and L3 spinous processes has resolved. 3. New mild linear hypermetabolism to the left of the T11-12 spinous processes without discrete mass on the CT images, favor benign activity related activity, recommend attention on follow-up PET-CT.  4. No definite new sites of hypermetabolic metastatic disease. No recurrent hypermetabolic metastatic disease in the pelvis. 5. Interval stability of subsolid 1.3 cm superior segment right lower lobe pulmonary nodule without associated significant metabolism, which has grown compared to the 2009 chest CT study, and remain suspicious for low grade adenocarcinoma. 6. Additional findings include aortic atherosclerosis, coronary atherosclerosis, mild multinodular goiter with no hypermetabolic thyroid nodules, cholelithiasis and moderate sigmoid diverticulosis.   02/12/2016 PET scan   1. Interval increase in size and metabolic activity of the LEFT  paraspinal soft tissue metastasis. 2. New activity within the musculature of the LEFT chest wall. Given the unusual location of the paraspinal metastasis cannot exclude a second soft tissue metastasis to the musculature however favor benign physiologic activity. 3. Stable RIGHT lower lobe pulmonary nodule. 4. No evidence of local recurrence.     04/08/2016 Imaging   Ct scan of chest, abdomen and pelvis: 1. Similar size of a  left paravertebral abdominal lesion. 2. No new or progressive metastatic disease. 3. No correlate for the muscular activity about the lateral left chest wall. 4.  Coronary artery atherosclerosis. Aortic atherosclerosis. 5. Cholelithiasis. 6. Similar right lower lobe pulmonary nodule.   06/03/2016 Imaging   CT abdomen and pelvis 1. Similar to mild enlargement of a paravertebral soft tissue lesion at the L3-4 level. 2. No new sites of disease identified. 3. Cholelithiasis. 4. Hysterectomy. 5.  Aortic atherosclerosis.    09/02/2016 Imaging   CT abdomen and pelvis 1. Continue mild interval increase in size of LEFT paraspinal mass (1-2 mm). 2. No evidence of new metastatic disease in the abdomen pelvis. 3. Post hysterectomy anatomy   09/26/2016 Imaging   MR lumbar spine 1. Left paraspinal metastasis with epicenter adjacent to the left L3 neural foramen does extend  into the foramen to the level of the left lateral epidural space (series 9, image 31), but also tracks cephalad and caudal along the L2-L4 lumbar plexus (series 10, image 15). No extension into the left L2 or L4 neural foramina. And no more distal extension of tumor. 2. Abnormal signal in the medial left psoas and ventral left erector spinae muscles at L2 and L3 is probably denervation related. 3. No bone invasion or osseous metastatic disease. Suspect previous radiation of the L2 through L5 spinal levels. 4. No dural or intradural metastatic disease.    10/22/2016 Procedure   She underwent  stereotactic Radiosurgery   10/30/2016 Genetic Testing   Patient has genetic testing done for Inheritable genetic mutation panel. Results revealed patient has no actionable mutation.   01/21/2017 Imaging   1. Reduced size and conspicuity of the left paraspinal mass at the L3-4 level. The mass still present but has enhancement similar to that of adjacent psoas musculature. 2. No new metastatic disease is identified. 3. Other imaging findings of potential clinical significance: Cholelithiasis. Aortic Atherosclerosis (ICD10-I70.0). Sigmoid diverticulosis. Lumbar scoliosis.   05/09/2017 Imaging   Ct scan of abdomen and pelvis 1. Paraspinal mass adjacent to the left side of L3-L4 is less distinct than prior examinations, and is therefore difficult to discretely measure, however, the overall appearance suggests continued positive response to therapy. 2. No new signs of metastatic disease elsewhere in the abdomen or pelvis. 3. Aortic atherosclerosis, in addition to at least right coronary artery disease. Assessment for potential risk factor modification, dietary therapy or pharmacologic therapy may be warranted, if clinically indicated. 4. Colonic diverticulosis without evidence of acute diverticulitis at this time. 5. Additional incidental findings, as above. Aortic Atherosclerosis (ICD10-I70.0).   08/14/2017 Imaging   1. Treated left paraspinal mass centered at L3-4. Mass size is stable but there has been some evolution of tumor characteristics with less central fluid seen today. Recommend continued surveillance. 2. No evidence of untreated metastasis. 3. Degenerative changes and related impingement are described above.   10/28/2017 Imaging   Stable small left paraspinal soft tissue mass. No new or progressive disease within the abdomen or pelvis.  Cholelithiasis.  No radiographic evidence of cholecystitis.  Colonic diverticulosis, without radiographic evidence of diverticulitis.   04/30/2018  Imaging   Stable post treatment change of the partially necrotic LEFT paravertebral mass at L3-L4. 18 x 21 mm cross-section. Mass effect on the LEFT L3 nerve root redemonstrated. Enhancing and atrophic LEFT psoas is inseparable.   10/15/2018 Imaging   1. Stable post treatment size and appearance of partially necrotic left paravertebral mass at L3-4, measuring 19 x 25 mm on current exam (unchanged when measured at similar level on previous study). Mass effect on the adjacent left L3 nerve root is unchanged. Adjacent enhancing and atrophic left psoas muscle. 2. Left convex scoliosis with associated multilevel degenerative spondylolysis and facet arthrosis, unchanged   10/29/2018 Imaging   Stable small left L3 paraspinal soft tissue mass. No evidence of new or progressive metastatic disease, or other acute findings.   Cholelithiasis.  No radiographic evidence of cholecystitis.   Colonic diverticulosis, without radiographic evidence of diverticulitis.   11/11/2018 PET scan   1. Low level metabolism (max SUV 2.0) associated with the irregular subsolid superior segment right lower lobe 2.1 cm pulmonary nodule. As better depicted on the recent diagnostic chest CT study, this nodule crosses the major fissure into the right upper lobe and has increased in size and density on multiple imaging studies back  to 2009. Findings are most suggestive of primary bronchogenic adenocarcinoma. 2. No hypermetabolic thoracic adenopathy. 3. Persistent hypermetabolism (max SUV 8.0) associated with the known left L3-4 paraspinous metastasis, mildly decreased in metabolism since 02/12/2016 PET-CT. 4. No additional sites of hypermetabolic metastatic disease in the abdomen or pelvis. No metabolic evidence of peritoneal recurrence. No ascites. 5. Chronic findings include: Aortic Atherosclerosis (ICD10-I70.0). Coronary atherosclerosis. Chronic bilateral maxillary sinusitis. Cholelithiasis. Moderate sigmoid diverticulosis.    01/07/2019 - 04/04/2019 Chemotherapy   The patient had carboplatin and Alimta for chemotherapy treatment.     05/03/2019 -  Chemotherapy   The patient had Pembrolizumab and Lenvima for chemotherapy treatment.     07/23/2019 PET scan   1. 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 No new sites of hypermetabolic metastatic disease on today's study. 2. Stable appearance of surgical changes in the right lung without features to suggest local recurrence or metastatic lung cancer. 3. Cholelithiasis. 4.  Aortic Atherosclerois (ICD10-170.0)   10/20/2019 Imaging   Bilateral venous Doppler US RIGHT:  - Findings consistent with acute and occlusive deep vein thrombosis involving the right common femoral vein, right external iliac vein, right femoral vein, right popliteal vein, right posterior tibial veins, and right peroneal veins.   Unable to adequately visualize the common iliac veins due to bowel gas. The imaged portions of the IVC were patent.     LEFT:  - No evidence of common femoral vein obstruction.    11/08/2019 PET scan   1. Further reduction in activity at the left L3 level and left paraspinal tissues at L3. Maximum vertebral SUV currently 3.9, previously 4.8 on 07/23/2019 and previously 10.0 on 04/14/2019. 2. Late phase healing of previous left transverse process fractures at L3 and L4. Suspected healing right sixth rib fracture anteriorly. 3. There is new focal activity in the vicinity of the right internal jugular vein in the lower neck. Maximum SUV in this vicinity is 5.1. I not see a well-defined lymph node are thyroid lesion to corroborate with the focus, but surveillance in this region is suggested. 4. Other imaging findings of potential clinical significance: Aortic Atherosclerosis (ICD10-I70.0). Coronary atherosclerosis. Cholelithiasis. Sigmoid colon diverticulosis.   Metastatic cancer to bone ([place])  06/27/2014 Initial  Diagnosis   Metastatic cancer to bone   Primary lung cancer, right ([place])  11/04/2018 Initial Diagnosis   Primary lung cancer, right ([place])   11/11/2018 PET scan   1. Low level metabolism (max SUV 2.0) associated with the irregular subsolid superior segment right lower lobe 2.1 cm pulmonary nodule. As better depicted on the recent diagnostic chest CT study, this nodule crosses the major fissure into the right upper lobe and has increased in size and density on multiple imaging studies back to 2009. Findings are most suggestive of primary bronchogenic adenocarcinoma. 2. No hypermetabolic thoracic adenopathy. 3. Persistent hypermetabolism (max SUV 8.0) associated with the known left L3-4 paraspinous metastasis, mildly decreased in metabolism since 02/12/2016 PET-CT. 4. No additional sites of hypermetabolic metastatic disease in the abdomen or pelvis. No metabolic evidence of peritoneal recurrence. No ascites. 5. Chronic findings include: Aortic Atherosclerosis (ICD10-I70.0). Coronary atherosclerosis. Chronic bilateral maxillary sinusitis. Cholelithiasis. Moderate sigmoid diverticulosis.   12/07/2018 Pathology Results   1. Lung, resection (segmental or lobe), Right Lobe Superior with endblock wedge of right upper lobe - INVASIVE ADENOCARCINOMA, MODERATELY DIFFERENTIATED, SPANNING 1.6 CM. - THE SURGICAL RESECTION MARGINS ARE NEGATIVE FOR CARCINOMA. - SEE ONCOLOGY TABLE BELOW. 2. Lymph node, biopsy, Level 9 #  1 - THERE IS NO EVIDENCE OF CARCINOMA IN 1 OF 1 LYMPH NODE (0/1). 3. Lymph node, biopsy, Level 9 #2 - THERE IS NO EVIDENCE OF CARCINOMA IN 1 OF 1 LYMPH NODE (0/1). 4. Lymph node, biopsy, Level 7 #1 - METASTATIC CARCINOMA IN 1 OF 1 LYMPH NODE (1/1). 5. Lymph node, biopsy, Level 7 #2 - THERE IS NO EVIDENCE OF CARCINOMA IN 1 OF 1 LYMPH NODE (0/1). 6. Lymph node, biopsy, Level 12 #1 - THERE IS NO EVIDENCE OF CARCINOMA IN 1 OF 1 LYMPH NODE (0/1). 7. Lymph node, biopsy, Level 12 #2 - THERE IS NO  EVIDENCE OF CARCINOMA IN 1 OF 1 LYMPH NODE (0/1). 8. Lymph node, biopsy, Level 10 #1 - THERE IS NO EVIDENCE OF CARCINOMA IN 1 OF 1 LYMPH NODE (0/1). 9. Lymph node, biopsy, Level 4R #1 - THERE IS NO EVIDENCE OF CARCINOMA IN 1 OF 1 LYMPH NODE (0/1). 10. Lymph node, biopsy, Level 4R #2 - THERE IS NO EVIDENCE OF CARCINOMA IN 1 OF 1 LYMPH NODE (0/1). 11. Lymph node, biopsy, Level 2R #1 - THERE IS NO EVIDENCE OF CARCINOMA IN 1 OF 1 LYMPH NODE (0/1). 12. Lung, resection (segmental or lobe), Right Lobe Superior - BENIGN LUNG PARENCHYMA. - THERE IS NO EVIDENCE OF MALIGNANCY. Procedure: Segmentectomy. Specimen Laterality: Right. Tumor Site: Right superior lobe. Tumor Size: 1.6 cm (gross measurement). Tumor Focality: Unifocal. Histologic Type: Adenocarcinoma, moderately differentiated. Visceral Pleura Invasion: Not identified. Lymphovascular Invasion: Not identified. Direct Invasion of Adjacent Structures: Not identified. Margins: Negative for carcinoma. Treatment Effect: N/A Regional Lymph Nodes: Number of Lymph Nodes Involved: 1 (level 7) Number of Lymph Nodes Examined: 10 Pathologic Stage Classification (pTNM, AJCC 8th Edition): pT1b, pN2 Ancillary Studies: The tumor cells are positive for Napsin-A, TTF-1, and p53. They are essentially negative for estrogen receptor, PAX-8 and progesterone receptor. Additional studies can be performed upon clinician request. Representative Tumor Block: 1D-1E. (JBK:gt, 12/09/18)   12/07/2018 Surgery   PREOPERATIVE DIAGNOSIS:  Right lung nodule.   POSTOPERATIVE DIAGNOSIS:  Non-small cell carcinoma- suspected primary lung carcinoma, clinical stage T2N01b.   PROCEDURE:   Right video-assisted thoracoscopy, Right lower lobe superior segmentectomy with en bloc wedge resection of right upper lobe, Lymph node dissection, Intercostal nerve block levels 3-9.   SURGEON:  Modesto Charon, MD   FINDINGS:  Mass palpable in posterior aspect of superior segment of  right lower lobe, likely involvement across fissure into the upper lobe.  Frozen section revealed non-small cell carcinoma. upper and lower lobe margins clear.   12/23/2018 Cancer Staging   Staging form: Lung, AJCC 8th Edition - Pathologic: Stage IIIA (pT1b, pN2, cM0) - Signed by Heath Lark, MD on 12/23/2018   01/05/2019 Imaging   MRI brain 1. No metastatic disease or acute intracranial abnormality identified. 2. Small chronic parafalcine meningioma size is stable since that described in 2006 (12 x 15 mm). 3. Advanced signal changes in the cerebral white matter and to a lesser extent pons, nonspecific but most commonly due to chronic small vessel disease. 4. Partially visible degenerative cervical spinal stenosis.     01/07/2019 -  Chemotherapy   The patient had carboplatin and Alimta for chemotherapy treatment.     04/14/2019 PET scan   1. 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 As shown on recent MRI of 04/01/2019, there is a new component of invasion into the left lower L3 vertebral body. This new component has a maximum SUV of 10.0, compatible with active malignancy.  2. Interval wedge resection of portions of the right upper lobe and right lower lobe at the site of the prior nodule. There is only minimal low-grade activity along the wedge resection clips, maximum SUV of 1.5. Surveillance is likely warranted. No new nodule identified. 3. Other imaging findings of potential clinical significance: Aortic Atherosclerosis (ICD10-I70.0). Coronary atherosclerosis. Thoracolumbar scoliosis. Cholelithiasis. Sigmoid colon diverticulosis.     05/03/2019 -  Chemotherapy   The patient had Pembrolizumab and Lenvima for chemotherapy treatment.     05/22/2019 Imaging   Ct head Atrophy, chronic microvascular disease.   No acute intracranial abnormality.       REVIEW OF SYSTEMS:   Constitutional: Denies fevers, chills Eyes: Denies blurriness of vision Ears, nose,  mouth, throat, and face: Denies mucositis or sore throat Respiratory: Denies cough, dyspnea or wheezes Cardiovascular: Denies palpitation, chest discomfort or lower extremity swelling Skin: Denies abnormal skin rashes Lymphatics: Denies new lymphadenopathy or easy bruising Behavioral/Psych: Mood is stable, no new changes  All other systems were reviewed with the patient and are negative.  I have reviewed the past medical history, past surgical history, social history and family history with the patient and they are unchanged from previous note.  ALLERGIES:  is allergic to ace inhibitors, dilaudid [hydromorphone hcl], and codeine.  MEDICATIONS:  Current Outpatient Medications  Medication Sig Dispense Refill  . amLODipine (NORVASC) 5 MG tablet Take 1 tablet (5 mg total) by mouth daily. 30 tablet 3  . atenolol (TENORMIN) 25 MG tablet Take 25 mg by mouth 2 (two) times daily.     Marland Kitchen HYDROcodone-acetaminophen (NORCO/VICODIN) 5-325 MG tablet Take 1 tablet by mouth every 4 (four) hours as needed. 12 tablet 0  . lenvatinib 10 mg daily dose (LENVIMA, 10 MG DAILY DOSE,) capsule Take 1 capsule (10 mg total) by mouth daily. 30 capsule 11  . lidocaine-prilocaine (EMLA) cream Apply to [facility] cath 1-2 hours prior to access as directed 30 g 1  . LORazepam (ATIVAN) 0.5 MG tablet 1 tablet po 30 minutes prior to radiation or MRI 30 tablet 0  . memantine (NAMENDA) 5 MG tablet Take 1 tablet daily for one week, then take 1 tablet twice daily for one week, then take 1 tablet in the morning and 2 in the evening for one week, then take 2 tablets twice daily 70 tablet 0  . mirtazapine (REMERON) 7.5 MG tablet TAKE 1 TABLET (7.5 MG TOTAL) BY MOUTH AT BEDTIME. 90 tablet 2  . Multiple Vitamin (MULTIVITAMIN WITH MINERALS) TABS tablet Take 1 tablet by mouth daily.    . predniSONE (DELTASONE) 20 MG tablet Take 1 tablet (20 mg total) by mouth daily with breakfast. 10 tablet 0  . rivaroxaban (XARELTO) 10 MG TABS tablet Take 1  tablet (10 mg total) by mouth daily. 30 tablet 11  . simvastatin (ZOCOR) 20 MG tablet Take 20 mg by mouth every evening.      No current facility-administered medications for this visit.   Facility-Administered Medications Ordered in Other Visits  Medication Dose Route Frequency Provider Last Rate Last Admin  . heparin lock flush 100 unit/mL  500 Units Intracatheter Once Betti Goodenow, MD      . sodium chloride flush (NS) 0.9 % injection 10 mL  10 mL Intracatheter Once Alvy Bimler, Anea Fodera, MD        PHYSICAL EXAMINATION: ECOG PERFORMANCE STATUS: 2 - Symptomatic, <50% confined to bed  Vitals:   12/16/19 0918  BP: (!) 161/77  Pulse: 77  Resp: 16  Temp: 98.4  F (36.9 C)  SpO2: 100%   Filed Weights   12/16/19 0918  Weight: 90 lb 12.8 oz (41.2 kg)    GENERAL:alert, no distress and comfortable.  She looks thin and cachectic NEURO: alert & oriented x 3 with fluent speech, no focal motor/sensory deficits  LABORATORY DATA:  I have reviewed the data as listed    Component Value Date/Time   NA 142 11/29/2019 0925   NA 140 05/09/2017 0844   K 4.2 11/29/2019 0925   K 4.1 05/09/2017 0844   CL 111 11/29/2019 0925   CO2 19 (L) 11/29/2019 0925   CO2 26 05/09/2017 0844   GLUCOSE 107 (H) 11/29/2019 0925   GLUCOSE 82 05/09/2017 0844   BUN 36 (H) 11/29/2019 0925   BUN 20.7 05/09/2017 0844   CREATININE 1.07 (H) 11/29/2019 0925   CREATININE 0.7 05/09/2017 0844   CALCIUM 9.4 11/29/2019 0925   CALCIUM 9.3 05/09/2017 0844   PROT 6.8 11/29/2019 0925   PROT 6.8 05/09/2017 0844   ALBUMIN 3.3 (L) 11/29/2019 0925   ALBUMIN 3.6 05/09/2017 0844   AST 30 11/29/2019 0925   AST 27 05/09/2017 0844   ALT 25 11/29/2019 0925   ALT 17 05/09/2017 0844   ALKPHOS 55 11/29/2019 0925   ALKPHOS 43 05/09/2017 0844   BILITOT 0.6 11/29/2019 0925   BILITOT 0.81 05/09/2017 0844   GFRNONAA 49 (L) 11/29/2019 0925   GFRAA 57 (L) 11/29/2019 0925    No results found for: SPEP, UPEP  Lab Results  Component Value  Date   WBC 6.1 11/29/2019   NEUTROABS 4.0 11/29/2019   HGB 9.7 (L) 11/29/2019   HCT 31.1 (L) 11/29/2019   MCV 101.3 (H) 11/29/2019   PLT 194 11/29/2019      Chemistry      Component Value Date/Time   NA 142 11/29/2019 0925   NA 140 05/09/2017 0844   K 4.2 11/29/2019 0925   K 4.1 05/09/2017 0844   CL 111 11/29/2019 0925   CO2 19 (L) 11/29/2019 0925   CO2 26 05/09/2017 0844   BUN 36 (H) 11/29/2019 0925   BUN 20.7 05/09/2017 0844   CREATININE 1.07 (H) 11/29/2019 0925   CREATININE 0.7 05/09/2017 0844      Component Value Date/Time   CALCIUM 9.4 11/29/2019 0925   CALCIUM 9.3 05/09/2017 0844   ALKPHOS 55 11/29/2019 0925   ALKPHOS 43 05/09/2017 0844   AST 30 11/29/2019 0925   AST 27 05/09/2017 0844   ALT 25 11/29/2019 0925   ALT 17 05/09/2017 0844   BILITOT 0.6 11/29/2019 0925   BILITOT 0.81 05/09/2017 0844

## 2019-12-16 NOTE — Assessment & Plan Note (Signed)
Despite my best effort, she is to lose weight As above, we will hold her treatment I will start her on short course, low-dose prednisone therapy She will continue taking Remeron She is instructed to keep documenting her food intake and bring it to her next visit

## 2019-12-16 NOTE — Assessment & Plan Note (Signed)
Overall, she is not improving I recommend we put her treatment on hold and focus on aggressive supportive care I will reschedule her treatment is for next week I recommend holding Lenvima until her next visit It is possible, her failure to thrive, diarrhea and others could be related to side effects of her immunotherapy I also recommend a course of prednisone to see if we can reverse some of the side effects I will see her in 2 weeks for further assessment and follow-up Recommend her husband to join her with each visit so that we can have accurate communication

## 2019-12-20 ENCOUNTER — Ambulatory Visit: Payer: Medicare Other | Admitting: Hematology and Oncology

## 2019-12-20 ENCOUNTER — Ambulatory Visit: Payer: Medicare Other

## 2019-12-20 ENCOUNTER — Other Ambulatory Visit: Payer: Medicare Other

## 2019-12-22 DIAGNOSIS — M7742 Metatarsalgia, left foot: Secondary | ICD-10-CM | POA: Diagnosis not present

## 2019-12-22 DIAGNOSIS — M7741 Metatarsalgia, right foot: Secondary | ICD-10-CM | POA: Diagnosis not present

## 2019-12-24 ENCOUNTER — Other Ambulatory Visit: Payer: Self-pay | Admitting: Neurology

## 2019-12-27 ENCOUNTER — Telehealth: Payer: Self-pay | Admitting: Neurology

## 2019-12-27 NOTE — Telephone Encounter (Signed)
I called the pharmacy to confirm they have the refills that should be on file. They have the medication ready for pick up. The patient's husband is aware.

## 2019-12-27 NOTE — Telephone Encounter (Signed)
Pt is needing a refill on her memantine (NAMENDA) 5 MG tablet sent in to the [facility] on [address]

## 2019-12-28 ENCOUNTER — Inpatient Hospital Stay: Payer: Medicare Other

## 2019-12-28 ENCOUNTER — Inpatient Hospital Stay (HOSPITAL_BASED_OUTPATIENT_CLINIC_OR_DEPARTMENT_OTHER): Payer: Medicare Other | Admitting: Hematology and Oncology

## 2019-12-28 ENCOUNTER — Encounter: Payer: Self-pay | Admitting: Hematology and Oncology

## 2019-12-28 ENCOUNTER — Telehealth: Payer: Self-pay

## 2019-12-28 ENCOUNTER — Other Ambulatory Visit: Payer: Self-pay

## 2019-12-28 VITALS — BP 140/73 | HR 90 | Temp 97.9°F | Resp 18 | Ht <= 58 in | Wt 102.2 lb

## 2019-12-28 DIAGNOSIS — C541 Malignant neoplasm of endometrium: Secondary | ICD-10-CM

## 2019-12-28 DIAGNOSIS — Z853 Personal history of malignant neoplasm of breast: Secondary | ICD-10-CM

## 2019-12-28 DIAGNOSIS — D638 Anemia in other chronic diseases classified elsewhere: Secondary | ICD-10-CM

## 2019-12-28 DIAGNOSIS — C779 Secondary and unspecified malignant neoplasm of lymph node, unspecified: Secondary | ICD-10-CM | POA: Diagnosis not present

## 2019-12-28 DIAGNOSIS — Z7189 Other specified counseling: Secondary | ICD-10-CM

## 2019-12-28 DIAGNOSIS — L03116 Cellulitis of left lower limb: Secondary | ICD-10-CM | POA: Diagnosis not present

## 2019-12-28 DIAGNOSIS — Z95828 Presence of other vascular implants and grafts: Secondary | ICD-10-CM

## 2019-12-28 DIAGNOSIS — C3411 Malignant neoplasm of upper lobe, right bronchus or lung: Secondary | ICD-10-CM | POA: Diagnosis not present

## 2019-12-28 DIAGNOSIS — C7951 Secondary malignant neoplasm of bone: Secondary | ICD-10-CM | POA: Diagnosis not present

## 2019-12-28 DIAGNOSIS — R600 Localized edema: Secondary | ICD-10-CM | POA: Insufficient documentation

## 2019-12-28 DIAGNOSIS — D61818 Other pancytopenia: Secondary | ICD-10-CM | POA: Diagnosis not present

## 2019-12-28 DIAGNOSIS — D649 Anemia, unspecified: Secondary | ICD-10-CM | POA: Diagnosis not present

## 2019-12-28 DIAGNOSIS — C549 Malignant neoplasm of corpus uteri, unspecified: Secondary | ICD-10-CM

## 2019-12-28 DIAGNOSIS — R64 Cachexia: Secondary | ICD-10-CM

## 2019-12-28 DIAGNOSIS — D701 Agranulocytosis secondary to cancer chemotherapy: Secondary | ICD-10-CM

## 2019-12-28 DIAGNOSIS — E039 Hypothyroidism, unspecified: Secondary | ICD-10-CM

## 2019-12-28 LAB — T4, FREE: Free T4: 1.39 ng/dL — ABNORMAL HIGH (ref 0.61–1.12)

## 2019-12-28 LAB — CMP (CANCER CENTER ONLY)
ALT: 30 U/L (ref 0–44)
AST: 24 U/L (ref 15–41)
Albumin: 2.9 g/dL — ABNORMAL LOW (ref 3.5–5.0)
Alkaline Phosphatase: 45 U/L (ref 38–126)
Anion gap: 10 (ref 5–15)
BUN: 24 mg/dL — ABNORMAL HIGH (ref 8–23)
CO2: 23 mmol/L (ref 22–32)
Calcium: 9.4 mg/dL (ref 8.9–10.3)
Chloride: 106 mmol/L (ref 98–111)
Creatinine: 0.82 mg/dL (ref 0.44–1.00)
GFR, Est AFR Am: >60 mL/min (ref >60)
GFR, Estimated: >60 mL/min (ref >60)
Glucose, Bld: 156 mg/dL — ABNORMAL HIGH (ref 70–99)
Potassium: 4.5 mmol/L (ref 3.5–5.1)
Sodium: 139 mmol/L (ref 135–145)
Total Bilirubin: 0.3 mg/dL (ref 0.3–1.2)
Total Protein: 5.8 g/dL — ABNORMAL LOW (ref 6.5–8.1)

## 2019-12-28 LAB — CBC WITH DIFFERENTIAL (CANCER CENTER ONLY)
Abs Immature Granulocytes: 0.15 10*3/uL — ABNORMAL HIGH (ref 0.00–0.07)
Basophils Absolute: 0 10*3/uL (ref 0.0–0.1)
Basophils Relative: 0 %
Eosinophils Absolute: 0.1 10*3/uL (ref 0.0–0.5)
Eosinophils Relative: 1 %
HCT: 27.5 % — ABNORMAL LOW (ref 36.0–46.0)
Hemoglobin: 8.4 g/dL — ABNORMAL LOW (ref 12.0–15.0)
Immature Granulocytes: 2 %
Lymphocytes Relative: 12 %
Lymphs Abs: 1.3 10*3/uL (ref 0.7–4.0)
MCH: 29.3 pg (ref 26.0–34.0)
MCHC: 30.5 g/dL (ref 30.0–36.0)
MCV: 95.8 fL (ref 80.0–100.0)
Monocytes Absolute: 1.3 10*3/uL — ABNORMAL HIGH (ref 0.1–1.0)
Monocytes Relative: 13 %
Neutro Abs: 7.2 10*3/uL (ref 1.7–7.7)
Neutrophils Relative %: 72 %
Platelet Count: 237 10*3/uL (ref 150–400)
RBC: 2.87 MIL/uL — ABNORMAL LOW (ref 3.87–5.11)
RDW: 15.9 % — ABNORMAL HIGH (ref 11.5–15.5)
WBC Count: 10.1 10*3/uL (ref 4.0–10.5)
nRBC: 0 % (ref 0.0–0.2)

## 2019-12-28 LAB — TOTAL PROTEIN, URINE DIPSTICK: Protein, ur: NEGATIVE mg/dL

## 2019-12-28 LAB — TSH: TSH: 0.17 u[IU]/mL — ABNORMAL LOW (ref 0.308–3.960)

## 2019-12-28 MED ORDER — LENVIMA (10 MG DAILY DOSE) 10 MG PO CPPK: ORAL_CAPSULE | ORAL | 11 refills | Status: DC

## 2019-12-28 MED ORDER — CEPHALEXIN 500 MG PO CAPS
500.0000 mg | ORAL_CAPSULE | Freq: Two times a day (BID) | ORAL | 0 refills | Status: DC
Start: 2019-12-28 — End: 2020-01-12

## 2019-12-28 MED ORDER — SODIUM CHLORIDE 0.9% FLUSH
10.0000 mL | Freq: Once | INTRAVENOUS | Status: AC
  Administered 2019-12-28: 10 mL
  Filled 2019-12-28: qty 10

## 2019-12-28 NOTE — Assessment & Plan Note (Signed)
She has multifactorial anemia, worse likely due to nonhealing wound I will observe for now I plan to see her back next week and recheck her blood She might need transfusion support next week She denies bleeding

## 2019-12-28 NOTE — Assessment & Plan Note (Signed)
She has nonhealing ulcer on the left lower extremity with surrounding skin cellulitis The patient gave me permission to take a picture of her wound We will prescribe a course of Keflex refer her urgently to wound care clinic for further evaluation I placed dry dressing over the skin ulcer

## 2019-12-28 NOTE — Telephone Encounter (Signed)
Called urgent referral to wound center at [facility]. Appt for 9/13 at 1030, she has been placed on the cancellation list for a earlier appt if it becomes available.  Called husband and given appt details for 9/13 appt at 1030. He verbalized understanding.

## 2019-12-28 NOTE — Progress Notes (Signed)
[place] OFFICE PROGRESS NOTE  Patient Care Team: Shon Baton, MD as PCP - General (Internal Medicine) Heath Lark, MD as Consulting Physician (Hematology and Oncology)  ASSESSMENT & PLAN:  Endometrial cancer [facility]) Even though clinically, she is improving and have not lost weight Due to her newly discovered severe left lower extremity cellulitis and nonhealing wound, she is not a candidate to resume treatment today I have given her and her husband explicit instruction to hold off restarting Lenvima We will cancel her infusion today I will send in urgent request for her to be seen by wound care clinic as soon as possible and place her on a course of antibiotics She is developing severe anemia likely due to this and I will see her back next week to recheck her blood and provide transfusion support as needed  Anemia, chronic disease She has multifactorial anemia, worse likely due to nonhealing wound I will observe for now I plan to see her back next week and recheck her blood She might need transfusion support next week She denies bleeding  Cellulitis of left leg She has nonhealing ulcer on the left lower extremity with surrounding skin cellulitis The patient gave me permission to take a picture of her wound We will prescribe a course of Keflex refer her urgently to wound care clinic for further evaluation I placed dry dressing over the skin ulcer  Malignant cachexia ([place]) She is eating better She has completed a course of prednisone therapy Recommend the patient to continue Remeron and to increase nutritional intake as tolerated  Bilateral edema of lower extremity This is likely due to fluid retention from recent prednisone therapy Observe for now Due to her frail status, I do not recommend diuretic therapy, to avoid risk of dehydration I will continue to hold her treatment for now   Orders Placed This Encounter  Procedures  . AMB referral to wound care  center    Referral Priority:   Urgent    Referral Type:   Consultation    Number of Visits Requested:   1  . Sample to Blood Bank    Standing Status:   Standing    Number of Occurrences:   33    Standing Expiration Date:   12/27/2020    All questions were answered. The patient knows to call the clinic with any problems, questions or concerns. The total time spent in the appointment was 40 minutes encounter with patients including review of chart and various tests results, discussions about plan of care and coordination of care plan   Heath Lark, MD 12/28/2019 12:37 PM  INTERVAL HISTORY: Please see below for problem oriented charting. She returns with her husband for further follow-up Since last time I saw her, she is eating better and has gained some weight She noticed some very mild lower extremity edema At the end of the visit, her husband disclosed that the patient have a nonhealing skin ulcer on the left lower extremity She denies fever or chills The patient denies any recent signs or symptoms of bleeding such as spontaneous epistaxis, hematuria or hematochezia.  SUMMARY OF ONCOLOGIC HISTORY: Oncology History Overview Note  Biopsy of recurrence 06-2014 ER negative. PDL1 testing low at 2% on the uterine cancer She progressed on carboplatin and Paclitaxel for uterine cancer HER 2 negative on pathology [id number] Negative genetic testing  On her lung cancer sample, [employer] [facility] testing revealed 10% PD-1 positive EGFR mutation was detected, exon 19 deletion   Endometrial cancer (  [place])  12/01/2007 Initial Diagnosis   She has recurrent papillary serous endometrial carcinoma. Briefly she was diagnosed in 2009 with a FIGO IB pappilary serous carcinoma treated with hysterectomy followed by adjuvant chemo and vaginal brachytherapy. She then had a recurrence diagnosed in late 2012 that was deemed not to be surgically resectable, and was treated with 6 cycles of carbo/taxol followed by 5040  cGy of EBRT to the pelvic mass. She was then followed with serial imaging and was noted in June of this year to have slight increase in the size of the mass, and again in September there was small enlargement of the mass. She had a re-staging PET scan on 03/10/13 that showed FDG activity in the pelvic mass, with no other areas of disease   12/01/2007 Imaging   CT scan of chest, abdomen and pelvis: 1.  Mass like area of decreased attenuation in the central uterus, consistent with the known endometrial carcinoma.  Deep myometrial invasion is suspected.  Pelvic MRI without and with contrast could be performed for further staging workup if clinically warranted. 2.  No evidence of extrauterine extension or lymphadenopathy. 3.  Sigmoid diverticulosis incidentally noted.   12/15/2007 Surgery   --12/2007 laparoscopic hysterectomy and PLND --05/2008 complted 6 cycles adjuvant carbo/taxol followed by vaginal cuff brachy --03/2011 exploratory lararoscopy, ureterolysis --4/13 completed 6 cycels carbotaxol --8/13 completed EBRT to the left pelvic sidewall    06/10/2008 Imaging   CT scan abdomen and pelvis 1.  Nonspecific mildly prominent inguinal lymph nodes, right greater than left. 2.  Interval hysterectomy without evidence of recurrent pelvic mass or fluid collection.   03/22/2011 Imaging   Ct scan abdomen and pelvis 1.  Local uterine cancer recurrence with two large nodes along the left pelvic sidewall. 2.  No evidence of bowel obstruction, urinary obstruction, or more distant metastasis.   03/31/2011 Relapse/Recurrence   chemotherapy and external beam radiation   05/16/2011 - 09/17/2011 Chemotherapy   She had 6 cycles of carboplatin and Taxol    07/17/2011 Imaging   Ct scan abdomen and pelvis 1.  Interval improvement in the previously demonstrated left pelvic local recurrence of tumor. 2.  No disease progression or complication identified. 3.  Mild bladder wall thickening on the right, nonspecific  and possibly related to incomplete distension and/or radiation therapy. 4.  Cholelithiasis   10/11/2011 Imaging   CT scan abdomen and pelvis 1.  Interval enlargement of the left pelvic sidewall masses. 2.  No new nodular disease in the pelvis or lymphadenopathy. 3.  No evidence  of distant metastasis. 4.  Mild hydronephrosis on the right is similar to prior. 5.  Focal thickening of the right aspect the bladder is stable compared to prior.    10/28/2011 - 12/05/2011 Radiation Therapy   radiation for pelvic sidewall recurrence   10/28/2011 - 12/05/2011 Radiation Therapy   10/28/11-12/05/11: Radiotherapy to the left pelvic sidewall   03/18/2012 Imaging   CT abdomen 1.  Today's study demonstrates a positive response to therapy with decreased size of left pelvic sidewall nodal masses, as detailed above.  No new soft tissue masses or new lymphadenopathy is identified within the abdomen or pelvis. Continue attention on follow-up studies is recommended. 2.  Cholelithiasis without findings to suggest acute cholecystitis. 3.  Colonic diverticulosis without findings to suggest acute diverticulitis at this time. 4.  Mild asymmetric urinary bladder wall thickening is unchanged compared to prior examinations and is nonspecific.  No definite bladder wall mass is identified at this time. 5.  Additional findings, similar to prior examinations, as above   06/22/2012 Imaging   CT abdomen 1.  Further decreased size of the left pelvic peripherally enhancing lesion. 2.  No new sites of new or progressive disease. 3. Esophageal air fluid level suggests dysmotility or gastroesophageal reflux. 4.  Cholelithiasis   11/02/2012 Imaging   CT abdomen 1.  The left pelvic side wall lesion demonstrates mild increase in size from previous exam. 2.  No new sites of new or progressive disease. 3.  Cholelithiasis.    01/28/2013 Imaging   CT abdomen 1. Interval small increase in volume of enhancing mass adjacent to the  left pelvic sidewall. 2. No evidence of abdominal or pelvic lymphadenopathy   04/02/2013 Surgery   pelvic mass resection with IORT   04/02/2013 - 04/02/2013 Radiation Therapy   04/02/13: Intraoperative radiotherapy to the left pelvis   05/10/2013 PET scan   1. Hypermetabolic mass in the deep left pelvis consistent with metastasis. 2. No additional evidence of local metastasis. No evidence of distant metastasis. 3. Small right lower lobe pulmonary nodule is not hypermetabolic.   07/27/2013 Imaging   No evidence of recurrent or metastatic disease. Apparent prior resection of the deep left pelvic metastasis.   02/08/2014 Imaging   No CT findings for recurrent or metastatic disease involving the abdomen/ pelvis   06/13/2014 Relapse/Recurrence   + recurrence paraspinous muscle   06/21/2014 Procedure   There is a soft tissue lesion along the left side of L3-L4. This lesion roughly measures up to 2.2 cm. Needle was positioned along the posterior aspect of the lesion. Small amount of air in the paraspinal tissues following needle removal   06/21/2014 Pathology Results   Bone, biopsy, left lumbar paraspinal - METASTATIC POORLY DIFFERENTIATED CARCINOMA, CONSISTENT WITH HIGH GRADE SEROUS CARCINOMA SEE COMMENT. Microscopic Comment The carcinoma demonstrates the following immunophenotype: Cytokeratin 7 - patchy moderate to strong expression Estrogen receptor - negative expression P53 - strong diffuse expression TTF-1 - negative expression WT-1 - negative expression CD56 - focal moderate strong expression Synaptophysin - negative expression GCDFP - negative expression The history of primary endometrial papillary serous carcinoma and primary mammary carcinoma is noted. In the current case, the overall morphologic and immunophenotype are that of poorly differentiated carcinoma, consistent with high grade serous carcinoma.    07/11/2014 - 08/12/2014 Radiation Therapy   07/11/14-08/12/14: 55 Gy in 25  fractions to the Lumbar spine   08/15/2014 Imaging   CT scan of chest, abdomen and pelvis 1. Since the biopsy study of 06/21/2014, similar to slight decrease in size of a left paraspinous lesion at L3-4. 2. No new sites of metastatic disease identified. 3. 8 mm ground-glass nodule in the right lower lobe is grossly similar to 03/10/2013, suggesting a benign etiology. 4. Cholelithiasis. 5. Apparent sigmoid colonic wall thickening which could be due to underdistention. Colitis felt less likely. 6.  Atherosclerosis, including within the coronary arteries. 7. Pelvic floor laxity.   10/07/2014 Imaging   CT scan of chest, abdomen and pelvis: Stable to slight interval decrease in size of left paraspinous lesion at L3-4. No new sites of metastatic disease. Unchanged ground-glass nodule within the right lower lobe, potentially benign in etiology. Recommend attention on followup. Cholelithiasis. Sigmoid colonic diverticulosis. No CT evidence to suggest acute diverticulitis.   01/19/2015 Imaging   Ct scan of abdomen and pelvis 1. Decrease in size of left paraspinous metastasis. 2. No new sites of disease. 3. Stable ground-glass attenuating nodule within the superior segment of right  lower lobe. 4. Gallstones   05/02/2015 Imaging   Ct abdomen and pelvis: Slight interval increase in size of left lumbar paraspinal soft tissue metastasis. No new sites of metastatic disease identified within the abdomen or pelvis.    05/11/2015 PET scan   1. The small soft tissue mass just lateral to the left L3 for neural foramen is hypermetabolic, favoring malignancy. 2. There is also a small focus of hypermetabolic activity between the left L1 and L2 transverse processes, but without a CT correlate. 3. The 13 mm sub solid nodule in the superior segment right lower lobe is stable from recent exams but has slowly increased in size over the last 7 years. Although not hypermetabolic, the appearance is concerning for the  possibility of a low grade adenocarcinoma. 4. Other imaging findings of potential clinical significance: Chronic bilateral maxillary sinusitis. Coronary, aortic arch, and branch vessel atherosclerotic vascular disease. Aortoiliac atherosclerotic vascular disease. Cholelithiasis. Sigmoid colon diverticulosis. Lumbar scoliosis.   07/11/2015 Imaging   Ct scan of chest, abdomen and pelvis: 1. 13 mm mixed solid and sub solid nodule in the posterior right lower lobe was present in 2009 and has clearly progressed in the interval since that study. Imaging features remain highly concerning for low-grade or well differentiated adenocarcinoma. 2. Slight increase size of in the hypermetabolic, rim enhancing nodule identified adjacent to the left L3-4 neural foramen. Metastatic disease remains a concern. 3. No evidence for discrete soft tissue lesion between the left L1 and 2 transverse processes, the site of focal FDG uptake on the recent PET-CT. 4. Cholelithiasis. 5. Abdominal aortic atherosclerosis   08/03/2015 - 01/16/2016 Chemotherapy   She received carboplatin and Taxol   11/06/2015 PET scan   1. Hypermetabolic left Z4-8 paraspinous metastasis (biopsy-proven) is stable in size and slightly decreased in metabolism. 2. Previously described small focus of hypermetabolism between the left L2 and L3 spinous processes has resolved. 3. New mild linear hypermetabolism to the left of the T11-12 spinous processes without discrete mass on the CT images, favor benign activity related activity, recommend attention on follow-up PET-CT.  4. No definite new sites of hypermetabolic metastatic disease. No recurrent hypermetabolic metastatic disease in the pelvis. 5. Interval stability of subsolid 1.3 cm superior segment right lower lobe pulmonary nodule without associated significant metabolism, which has grown compared to the 2009 chest CT study, and remain suspicious for low grade adenocarcinoma. 6. Additional findings  include aortic atherosclerosis, coronary atherosclerosis, mild multinodular goiter with no hypermetabolic thyroid nodules, cholelithiasis and moderate sigmoid diverticulosis.   02/12/2016 PET scan   1. Interval increase in size and metabolic activity of the LEFT paraspinal soft tissue metastasis. 2. New activity within the musculature of the LEFT chest wall. Given the unusual location of the paraspinal metastasis cannot exclude a second soft tissue metastasis to the musculature however favor benign physiologic activity. 3. Stable RIGHT lower lobe pulmonary nodule. 4. No evidence of local recurrence.     04/08/2016 Imaging   Ct scan of chest, abdomen and pelvis: 1. Similar size of a  left paravertebral abdominal lesion. 2. No new or progressive metastatic disease. 3. No correlate for the muscular activity about the lateral left chest wall. 4.  Coronary artery atherosclerosis. Aortic atherosclerosis. 5. Cholelithiasis. 6. Similar right lower lobe pulmonary nodule.   06/03/2016 Imaging   CT abdomen and pelvis 1. Similar to mild enlargement of a paravertebral soft tissue lesion at the L3-4 level. 2. No new sites of disease identified. 3. Cholelithiasis. 4. Hysterectomy. 5.  Aortic atherosclerosis.    09/02/2016 Imaging   CT abdomen and pelvis 1. Continue mild interval increase in size of LEFT paraspinal mass (1-2 mm). 2. No evidence of new metastatic disease in the abdomen pelvis. 3. Post hysterectomy anatomy   09/26/2016 Imaging   MR lumbar spine 1. Left paraspinal metastasis with epicenter adjacent to the left L3 neural foramen does extend into the foramen to the level of the left lateral epidural space (series 9, image 31), but also tracks cephalad and caudal along the L2-L4 lumbar plexus (series 10, image 15). No extension into the left L2 or L4 neural foramina. And no more distal extension of tumor. 2. Abnormal signal in the medial left psoas and ventral left erector spinae  muscles at L2 and L3 is probably denervation related. 3. No bone invasion or osseous metastatic disease. Suspect previous radiation of the L2 through L5 spinal levels. 4. No dural or intradural metastatic disease.    10/22/2016 Procedure   She underwent stereotactic Radiosurgery   10/30/2016 Genetic Testing   Patient has genetic testing done for Inheritable genetic mutation panel. Results revealed patient has no actionable mutation.   01/21/2017 Imaging   1. Reduced size and conspicuity of the left paraspinal mass at the L3-4 level. The mass still present but has enhancement similar to that of adjacent psoas musculature. 2. No new metastatic disease is identified. 3. Other imaging findings of potential clinical significance: Cholelithiasis. Aortic Atherosclerosis (ICD10-I70.0). Sigmoid diverticulosis. Lumbar scoliosis.   05/09/2017 Imaging   Ct scan of abdomen and pelvis 1. Paraspinal mass adjacent to the left side of L3-L4 is less distinct than prior examinations, and is therefore difficult to discretely measure, however, the overall appearance suggests continued positive response to therapy. 2. No new signs of metastatic disease elsewhere in the abdomen or pelvis. 3. Aortic atherosclerosis, in addition to at least right coronary artery disease. Assessment for potential risk factor modification, dietary therapy or pharmacologic therapy may be warranted, if clinically indicated. 4. Colonic diverticulosis without evidence of acute diverticulitis at this time. 5. Additional incidental findings, as above. Aortic Atherosclerosis (ICD10-I70.0).   08/14/2017 Imaging   1. Treated left paraspinal mass centered at L3-4. Mass size is stable but there has been some evolution of tumor characteristics with less central fluid seen today. Recommend continued surveillance. 2. No evidence of untreated metastasis. 3. Degenerative changes and related impingement are described above.   10/28/2017 Imaging    Stable small left paraspinal soft tissue mass. No new or progressive disease within the abdomen or pelvis.  Cholelithiasis.  No radiographic evidence of cholecystitis.  Colonic diverticulosis, without radiographic evidence of diverticulitis.   04/30/2018 Imaging   Stable post treatment change of the partially necrotic LEFT paravertebral mass at L3-L4. 18 x 21 mm cross-section. Mass effect on the LEFT L3 nerve root redemonstrated. Enhancing and atrophic LEFT psoas is inseparable.   10/15/2018 Imaging   1. Stable post treatment size and appearance of partially necrotic left paravertebral mass at L3-4, measuring 19 x 25 mm on current exam (unchanged when measured at similar level on previous study). Mass effect on the adjacent left L3 nerve root is unchanged. Adjacent enhancing and atrophic left psoas muscle. 2. Left convex scoliosis with associated multilevel degenerative spondylolysis and facet arthrosis, unchanged   10/29/2018 Imaging   Stable small left L3 paraspinal soft tissue mass. No evidence of new or progressive metastatic disease, or other acute findings.   Cholelithiasis.  No radiographic evidence of cholecystitis.   Colonic diverticulosis, without  radiographic evidence of diverticulitis.   11/11/2018 PET scan   1. Low level metabolism (max SUV 2.0) associated with the irregular subsolid superior segment right lower lobe 2.1 cm pulmonary nodule. As better depicted on the recent diagnostic chest CT study, this nodule crosses the major fissure into the right upper lobe and has increased in size and density on multiple imaging studies back to 2009. Findings are most suggestive of primary bronchogenic adenocarcinoma. 2. No hypermetabolic thoracic adenopathy. 3. Persistent hypermetabolism (max SUV 8.0) associated with the known left L3-4 paraspinous metastasis, mildly decreased in metabolism since 02/12/2016 PET-CT. 4. No additional sites of hypermetabolic metastatic disease in the abdomen  or pelvis. No metabolic evidence of peritoneal recurrence. No ascites. 5. Chronic findings include: Aortic Atherosclerosis (ICD10-I70.0). Coronary atherosclerosis. Chronic bilateral maxillary sinusitis. Cholelithiasis. Moderate sigmoid diverticulosis.   01/07/2019 - 04/04/2019 Chemotherapy   The patient had carboplatin and Alimta for chemotherapy treatment.     05/03/2019 -  Chemotherapy   The patient had Pembrolizumab and Lenvima for chemotherapy treatment.     07/23/2019 PET scan   1. 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 No new sites of hypermetabolic metastatic disease on today's study. 2. Stable appearance of surgical changes in the right lung without features to suggest local recurrence or metastatic lung cancer. 3. Cholelithiasis. 4.  Aortic Atherosclerois (ICD10-170.0)   10/20/2019 Imaging   Bilateral venous Doppler US RIGHT:  - Findings consistent with acute and occlusive deep vein thrombosis involving the right common femoral vein, right external iliac vein, right femoral vein, right popliteal vein, right posterior tibial veins, and right peroneal veins.   Unable to adequately visualize the common iliac veins due to bowel gas. The imaged portions of the IVC were patent.     LEFT:  - No evidence of common femoral vein obstruction.    11/08/2019 PET scan   1. Further reduction in activity at the left L3 level and left paraspinal tissues at L3. Maximum vertebral SUV currently 3.9, previously 4.8 on 07/23/2019 and previously 10.0 on 04/14/2019. 2. Late phase healing of previous left transverse process fractures at L3 and L4. Suspected healing right sixth rib fracture anteriorly. 3. There is new focal activity in the vicinity of the right internal jugular vein in the lower neck. Maximum SUV in this vicinity is 5.1. I not see a well-defined lymph node are thyroid lesion to corroborate with the focus, but surveillance in this  region is suggested. 4. Other imaging findings of potential clinical significance: Aortic Atherosclerosis (ICD10-I70.0). Coronary atherosclerosis. Cholelithiasis. Sigmoid colon diverticulosis.   Metastatic cancer to bone ([place])  06/27/2014 Initial Diagnosis   Metastatic cancer to bone   Primary lung cancer, right ([place])  11/04/2018 Initial Diagnosis   Primary lung cancer, right ([place])   11/11/2018 PET scan   1. Low level metabolism (max SUV 2.0) associated with the irregular subsolid superior segment right lower lobe 2.1 cm pulmonary nodule. As better depicted on the recent diagnostic chest CT study, this nodule crosses the major fissure into the right upper lobe and has increased in size and density on multiple imaging studies back to 2009. Findings are most suggestive of primary bronchogenic adenocarcinoma. 2. No hypermetabolic thoracic adenopathy. 3. Persistent hypermetabolism (max SUV 8.0) associated with the known left L3-4 paraspinous metastasis, mildly decreased in metabolism since 02/12/2016 PET-CT. 4. No additional sites of hypermetabolic metastatic disease in the abdomen or pelvis. No metabolic evidence of peritoneal recurrence. No ascites. 5. Chronic findings  include: Aortic Atherosclerosis (ICD10-I70.0). Coronary atherosclerosis. Chronic bilateral maxillary sinusitis. Cholelithiasis. Moderate sigmoid diverticulosis.   12/07/2018 Pathology Results   1. Lung, resection (segmental or lobe), Right Lobe Superior with endblock wedge of right upper lobe - INVASIVE ADENOCARCINOMA, MODERATELY DIFFERENTIATED, SPANNING 1.6 CM. - THE SURGICAL RESECTION MARGINS ARE NEGATIVE FOR CARCINOMA. - SEE ONCOLOGY TABLE BELOW. 2. Lymph node, biopsy, Level 9 #1 - THERE IS NO EVIDENCE OF CARCINOMA IN 1 OF 1 LYMPH NODE (0/1). 3. Lymph node, biopsy, Level 9 #2 - THERE IS NO EVIDENCE OF CARCINOMA IN 1 OF 1 LYMPH NODE (0/1). 4. Lymph node, biopsy, Level 7 #1 - METASTATIC CARCINOMA IN 1 OF 1 LYMPH NODE (1/1). 5.  Lymph node, biopsy, Level 7 #2 - THERE IS NO EVIDENCE OF CARCINOMA IN 1 OF 1 LYMPH NODE (0/1). 6. Lymph node, biopsy, Level 12 #1 - THERE IS NO EVIDENCE OF CARCINOMA IN 1 OF 1 LYMPH NODE (0/1). 7. Lymph node, biopsy, Level 12 #2 - THERE IS NO EVIDENCE OF CARCINOMA IN 1 OF 1 LYMPH NODE (0/1). 8. Lymph node, biopsy, Level 10 #1 - THERE IS NO EVIDENCE OF CARCINOMA IN 1 OF 1 LYMPH NODE (0/1). 9. Lymph node, biopsy, Level 4R #1 - THERE IS NO EVIDENCE OF CARCINOMA IN 1 OF 1 LYMPH NODE (0/1). 10. Lymph node, biopsy, Level 4R #2 - THERE IS NO EVIDENCE OF CARCINOMA IN 1 OF 1 LYMPH NODE (0/1). 11. Lymph node, biopsy, Level 2R #1 - THERE IS NO EVIDENCE OF CARCINOMA IN 1 OF 1 LYMPH NODE (0/1). 12. Lung, resection (segmental or lobe), Right Lobe Superior - BENIGN LUNG PARENCHYMA. - THERE IS NO EVIDENCE OF MALIGNANCY. Procedure: Segmentectomy. Specimen Laterality: Right. Tumor Site: Right superior lobe. Tumor Size: 1.6 cm (gross measurement). Tumor Focality: Unifocal. Histologic Type: Adenocarcinoma, moderately differentiated. Visceral Pleura Invasion: Not identified. Lymphovascular Invasion: Not identified. Direct Invasion of Adjacent Structures: Not identified. Margins: Negative for carcinoma. Treatment Effect: N/A Regional Lymph Nodes: Number of Lymph Nodes Involved: 1 (level 7) Number of Lymph Nodes Examined: 10 Pathologic Stage Classification (pTNM, AJCC 8th Edition): pT1b, pN2 Ancillary Studies: The tumor cells are positive for Napsin-A, TTF-1, and p53. They are essentially negative for estrogen receptor, PAX-8 and progesterone receptor. Additional studies can be performed upon clinician request. Representative Tumor Block: 1D-1E. (JBK:gt, 12/09/18)   12/07/2018 Surgery   PREOPERATIVE DIAGNOSIS:  Right lung nodule.   POSTOPERATIVE DIAGNOSIS:  Non-small cell carcinoma- suspected primary lung carcinoma, clinical stage T2N01b.   PROCEDURE:   Right video-assisted thoracoscopy, Right lower  lobe superior segmentectomy with en bloc wedge resection of right upper lobe, Lymph node dissection, Intercostal nerve block levels 3-9.   SURGEON:  Modesto Charon, MD   FINDINGS:  Mass palpable in posterior aspect of superior segment of right lower lobe, likely involvement across fissure into the upper lobe.  Frozen section revealed non-small cell carcinoma. upper and lower lobe margins clear.   12/23/2018 Cancer Staging   Staging form: Lung, AJCC 8th Edition - Pathologic: Stage IIIA (pT1b, pN2, cM0) - Signed by Heath Lark, MD on 12/23/2018   01/05/2019 Imaging   MRI brain 1. No metastatic disease or acute intracranial abnormality identified. 2. Small chronic parafalcine meningioma size is stable since that described in 2006 (12 x 15 mm). 3. Advanced signal changes in the cerebral white matter and to a lesser extent pons, nonspecific but most commonly due to chronic small vessel disease. 4. Partially visible degenerative cervical spinal stenosis.     01/07/2019 -  Chemotherapy  The patient had carboplatin and Alimta for chemotherapy treatment.     04/14/2019 PET scan   1. 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 As shown on recent MRI of 04/01/2019, there is a new component of invasion into the left lower L3 vertebral body. This new component has a maximum SUV of 10.0, compatible with active malignancy. 2. Interval wedge resection of portions of the right upper lobe and right lower lobe at the site of the prior nodule. There is only minimal low-grade activity along the wedge resection clips, maximum SUV of 1.5. Surveillance is likely warranted. No new nodule identified. 3. Other imaging findings of potential clinical significance: Aortic Atherosclerosis (ICD10-I70.0). Coronary atherosclerosis. Thoracolumbar scoliosis. Cholelithiasis. Sigmoid colon diverticulosis.     05/03/2019 -  Chemotherapy   The patient had Pembrolizumab and Lenvima for chemotherapy  treatment.     05/22/2019 Imaging   Ct head Atrophy, chronic microvascular disease.   No acute intracranial abnormality.       REVIEW OF SYSTEMS:   Constitutional: Denies fevers, chills or abnormal weight loss Eyes: Denies blurriness of vision Ears, nose, mouth, throat, and face: Denies mucositis or sore throat Respiratory: Denies cough, dyspnea or wheezes Gastrointestinal:  Denies nausea, heartburn or change in bowel habits Lymphatics: Denies new lymphadenopathy or easy bruising Neurological:Denies numbness, tingling or new weaknesses Behavioral/Psych: Mood is stable, no new changes  All other systems were reviewed with the patient and are negative.  I have reviewed the past medical history, past surgical history, social history and family history with the patient and they are unchanged from previous note.  ALLERGIES:  is allergic to ace inhibitors, dilaudid [hydromorphone hcl], and codeine.  MEDICATIONS:  Current Outpatient Medications  Medication Sig Dispense Refill  . amLODipine (NORVASC) 5 MG tablet Take 1 tablet (5 mg total) by mouth daily. 30 tablet 3  . atenolol (TENORMIN) 25 MG tablet Take 25 mg by mouth 2 (two) times daily.     . cephALEXin (KEFLEX) 500 MG capsule Take 1 capsule (500 mg total) by mouth 2 (two) times daily. 28 capsule 0  . HYDROcodone-acetaminophen (NORCO/VICODIN) 5-325 MG tablet Take 1 tablet by mouth every 4 (four) hours as needed. 12 tablet 0  . lenvatinib 10 mg daily dose (LENVIMA, 10 MG DAILY DOSE,) capsule Take 1 capsule every other day by mouth 30 capsule 11  . lidocaine-prilocaine (EMLA) cream Apply to [facility] cath 1-2 hours prior to access as directed 30 g 1  . LORazepam (ATIVAN) 0.5 MG tablet 1 tablet po 30 minutes prior to radiation or MRI 30 tablet 0  . memantine (NAMENDA) 5 MG tablet TAKE 1 TABLET DAILY FOR ONE WEEK, THEN TAKE 1 TABLET TWICE DAILY FOR ONE WEEK, THEN TAKE 1 TABLET IN THE MORNING AND 2 IN THE EVENING FOR ONE WEEK, THEN TAKE 2  TABLETS TWICE DAILY 210 tablet 1  . mirtazapine (REMERON) 7.5 MG tablet TAKE 1 TABLET (7.5 MG TOTAL) BY MOUTH AT BEDTIME. 90 tablet 2  . Multiple Vitamin (MULTIVITAMIN WITH MINERALS) TABS tablet Take 1 tablet by mouth daily.    . rivaroxaban (XARELTO) 10 MG TABS tablet Take 1 tablet (10 mg total) by mouth daily. 30 tablet 11  . simvastatin (ZOCOR) 20 MG tablet Take 20 mg by mouth every evening.      No current facility-administered medications for this visit.   Facility-Administered Medications Ordered in Other Visits  Medication Dose Route Frequency Provider Last Rate Last Admin  . heparin lock flush 100 unit/mL  500 Units Intracatheter Once Alvy Bimler, Addisen Chappelle, MD      . sodium chloride flush (NS) 0.9 % injection 10 mL  10 mL Intracatheter Once Alvy Bimler, Zurie Platas, MD        PHYSICAL EXAMINATION: ECOG PERFORMANCE STATUS: 1 - Symptomatic but completely ambulatory  Vitals:   12/28/19 1209  BP: 140/73  Pulse: 90  Resp: 18  Temp: 97.9 F (36.6 C)  SpO2: 100%   Filed Weights   12/28/19 1209  Weight: 102 lb 3.2 oz (46.4 kg)    GENERAL:alert, no distress and comfortable SKIN: She has a deep ulcer on the left lower extremity, measuring approximately 1 cm in size, deep with exposed soft tissue/tendon underneath There is surrounding skin has evidence of erythematous changes consistent with cellulitis HEART: noted moderate bilateral lower extremity edema NEURO: alert & oriented x 3 with fluent speech, no focal motor/sensory deficits     LABORATORY DATA:  I have reviewed the data as listed    Component Value Date/Time   NA 142 11/29/2019 0925   NA 140 05/09/2017 0844   K 4.2 11/29/2019 0925   K 4.1 05/09/2017 0844   CL 111 11/29/2019 0925   CO2 19 (L) 11/29/2019 0925   CO2 26 05/09/2017 0844   GLUCOSE 107 (H) 11/29/2019 0925   GLUCOSE 82 05/09/2017 0844   BUN 36 (H) 11/29/2019 0925   BUN 20.7 05/09/2017 0844   CREATININE 1.07 (H) 11/29/2019 0925   CREATININE 0.7 05/09/2017 0844    CALCIUM 9.4 11/29/2019 0925   CALCIUM 9.3 05/09/2017 0844   PROT 6.8 11/29/2019 0925   PROT 6.8 05/09/2017 0844   ALBUMIN 3.3 (L) 11/29/2019 0925   ALBUMIN 3.6 05/09/2017 0844   AST 30 11/29/2019 0925   AST 27 05/09/2017 0844   ALT 25 11/29/2019 0925   ALT 17 05/09/2017 0844   ALKPHOS 55 11/29/2019 0925   ALKPHOS 43 05/09/2017 0844   BILITOT 0.6 11/29/2019 0925   BILITOT 0.81 05/09/2017 0844   GFRNONAA 49 (L) 11/29/2019 0925   GFRAA 57 (L) 11/29/2019 0925    No results found for: SPEP, UPEP  Lab Results  Component Value Date   WBC 10.1 12/28/2019   NEUTROABS 7.2 12/28/2019   HGB 8.4 (L) 12/28/2019   HCT 27.5 (L) 12/28/2019   MCV 95.8 12/28/2019   PLT 237 12/28/2019      Chemistry      Component Value Date/Time   NA 142 11/29/2019 0925   NA 140 05/09/2017 0844   K 4.2 11/29/2019 0925   K 4.1 05/09/2017 0844   CL 111 11/29/2019 0925   CO2 19 (L) 11/29/2019 0925   CO2 26 05/09/2017 0844   BUN 36 (H) 11/29/2019 0925   BUN 20.7 05/09/2017 0844   CREATININE 1.07 (H) 11/29/2019 0925   CREATININE 0.7 05/09/2017 0844      Component Value Date/Time   CALCIUM 9.4 11/29/2019 0925   CALCIUM 9.3 05/09/2017 0844   ALKPHOS 55 11/29/2019 0925   ALKPHOS 43 05/09/2017 0844   AST 30 11/29/2019 0925   AST 27 05/09/2017 0844   ALT 25 11/29/2019 0925   ALT 17 05/09/2017 0844   BILITOT 0.6 11/29/2019 0925   BILITOT 0.81 05/09/2017 0844

## 2019-12-28 NOTE — Assessment & Plan Note (Signed)
Even though clinically, she is improving and have not lost weight Due to her newly discovered severe left lower extremity cellulitis and nonhealing wound, she is not a candidate to resume treatment today I have given her and her husband explicit instruction to hold off restarting Lenvima We will cancel her infusion today I will send in urgent request for her to be seen by wound care clinic as soon as possible and place her on a course of antibiotics She is developing severe anemia likely due to this and I will see her back next week to recheck her blood and provide transfusion support as needed

## 2019-12-28 NOTE — Patient Instructions (Signed)
1) Hold Lenvima 2) monitor your wound carefully, call if you have fever of greater than 101F

## 2019-12-28 NOTE — Assessment & Plan Note (Signed)
She is eating better She has completed a course of prednisone therapy Recommend the patient to continue Remeron and to increase nutritional intake as tolerated

## 2019-12-28 NOTE — Assessment & Plan Note (Signed)
This is likely due to fluid retention from recent prednisone therapy Observe for now Due to her frail status, I do not recommend diuretic therapy, to avoid risk of dehydration I will continue to hold her treatment for now

## 2019-12-28 NOTE — Patient Instructions (Signed)

## 2019-12-30 ENCOUNTER — Other Ambulatory Visit: Payer: Self-pay | Admitting: Hematology and Oncology

## 2019-12-30 ENCOUNTER — Telehealth: Payer: Self-pay

## 2019-12-30 NOTE — Telephone Encounter (Signed)
Called back regarding message left.  She is complaining that both of her legs are enormous today. She has been up walking around like normal. She has not changed the dressing since she was seen in the office. She thinks the wound looks the same She took the dressing off this morning and did not replace the dressing. Per Dr. Alvy Bimler instructed to call PCP now for a urgent appt. Clean wound daily/change dressing daily and if becomes wet/ or soiled to change dressing. Elevated feet today for edema. She verbalized understanding.  Mentioned making a referral to [facility] for wound care and other needs. She and her husband both declined offer. She will call the office back if a referral is needed. Reminded that wound care appt is not until 9/15 at 0900 at the wound care center at [facility]. She verbalized understanding.

## 2020-01-02 ENCOUNTER — Emergency Department (HOSPITAL_COMMUNITY)
Admission: EM | Admit: 2020-01-02 | Discharge: 2020-01-02 | Disposition: A | Discharge status: Home / Self Care | Payer: Medicare Other | Attending: Emergency Medicine | Admitting: Emergency Medicine

## 2020-01-02 ENCOUNTER — Encounter (HOSPITAL_COMMUNITY): Payer: Self-pay

## 2020-01-02 ENCOUNTER — Other Ambulatory Visit: Payer: Self-pay

## 2020-01-02 DIAGNOSIS — Z853 Personal history of malignant neoplasm of breast: Secondary | ICD-10-CM | POA: Diagnosis not present

## 2020-01-02 DIAGNOSIS — Z79899 Other long term (current) drug therapy: Secondary | ICD-10-CM | POA: Diagnosis not present

## 2020-01-02 DIAGNOSIS — R609 Edema, unspecified: Secondary | ICD-10-CM

## 2020-01-02 DIAGNOSIS — Z7901 Long term (current) use of anticoagulants: Secondary | ICD-10-CM | POA: Diagnosis not present

## 2020-01-02 DIAGNOSIS — R600 Localized edema: Secondary | ICD-10-CM | POA: Insufficient documentation

## 2020-01-02 DIAGNOSIS — I10 Essential (primary) hypertension: Secondary | ICD-10-CM | POA: Diagnosis not present

## 2020-01-02 DIAGNOSIS — Z87891 Personal history of nicotine dependence: Secondary | ICD-10-CM | POA: Diagnosis not present

## 2020-01-02 DIAGNOSIS — Z8541 Personal history of malignant neoplasm of cervix uteri: Secondary | ICD-10-CM | POA: Insufficient documentation

## 2020-01-02 MED ORDER — FUROSEMIDE 20 MG PO TABS
20.0000 mg | ORAL_TABLET | Freq: Every day | ORAL | 0 refills | Status: DC
Start: 2020-01-02 — End: 2021-02-16

## 2020-01-02 MED ORDER — POTASSIUM CHLORIDE ER 10 MEQ PO TBCR
10.0000 meq | EXTENDED_RELEASE_TABLET | Freq: Every day | ORAL | 0 refills | Status: DC
Start: 2020-01-02 — End: 2020-10-31

## 2020-01-02 NOTE — ED Triage Notes (Signed)
Pt arrives POV from home with complaints of bilateral lower leg swelling. Pt reports that she is a cancer pt, receiving immunotherapy. Pt reports she noticed the swelling yesterday and called her oncologist who did not advise on what to do. Pt denies SHOB, chest pain or leg pain

## 2020-01-02 NOTE — Discharge Instructions (Addendum)
You are being started on a low dose of a diuretic to help with swelling in your legs.  Keep your legs elevated when you are off your feet. Amlodipine (norvasc) can also cause leg swelling. This may need to be considered if your symptoms persist.  Follow-up with either Dr Alvy Bimler or Dr Virgina Jock.

## 2020-01-02 NOTE — ED Notes (Signed)
ED Provider at bedside. 

## 2020-01-04 ENCOUNTER — Inpatient Hospital Stay: Payer: Medicare Other

## 2020-01-04 ENCOUNTER — Other Ambulatory Visit: Payer: Self-pay

## 2020-01-04 ENCOUNTER — Encounter: Payer: Self-pay | Admitting: Hematology and Oncology

## 2020-01-04 ENCOUNTER — Telehealth: Payer: Self-pay

## 2020-01-04 ENCOUNTER — Inpatient Hospital Stay (HOSPITAL_BASED_OUTPATIENT_CLINIC_OR_DEPARTMENT_OTHER): Payer: Medicare Other | Admitting: Hematology and Oncology

## 2020-01-04 VITALS — BP 155/64 | HR 78 | Resp 18 | Ht 59.0 in | Wt 104.4 lb

## 2020-01-04 DIAGNOSIS — Z95828 Presence of other vascular implants and grafts: Secondary | ICD-10-CM

## 2020-01-04 DIAGNOSIS — C549 Malignant neoplasm of corpus uteri, unspecified: Secondary | ICD-10-CM

## 2020-01-04 DIAGNOSIS — D638 Anemia in other chronic diseases classified elsewhere: Secondary | ICD-10-CM

## 2020-01-04 DIAGNOSIS — C779 Secondary and unspecified malignant neoplasm of lymph node, unspecified: Secondary | ICD-10-CM | POA: Diagnosis not present

## 2020-01-04 DIAGNOSIS — R413 Other amnesia: Secondary | ICD-10-CM

## 2020-01-04 DIAGNOSIS — D61818 Other pancytopenia: Secondary | ICD-10-CM

## 2020-01-04 DIAGNOSIS — L03116 Cellulitis of left lower limb: Secondary | ICD-10-CM | POA: Diagnosis not present

## 2020-01-04 DIAGNOSIS — Z7189 Other specified counseling: Secondary | ICD-10-CM

## 2020-01-04 DIAGNOSIS — C541 Malignant neoplasm of endometrium: Secondary | ICD-10-CM

## 2020-01-04 DIAGNOSIS — Z853 Personal history of malignant neoplasm of breast: Secondary | ICD-10-CM

## 2020-01-04 DIAGNOSIS — D649 Anemia, unspecified: Secondary | ICD-10-CM | POA: Diagnosis not present

## 2020-01-04 DIAGNOSIS — E039 Hypothyroidism, unspecified: Secondary | ICD-10-CM

## 2020-01-04 DIAGNOSIS — C7951 Secondary malignant neoplasm of bone: Secondary | ICD-10-CM

## 2020-01-04 DIAGNOSIS — R600 Localized edema: Secondary | ICD-10-CM | POA: Diagnosis not present

## 2020-01-04 DIAGNOSIS — C3411 Malignant neoplasm of upper lobe, right bronchus or lung: Secondary | ICD-10-CM | POA: Diagnosis not present

## 2020-01-04 DIAGNOSIS — R64 Cachexia: Secondary | ICD-10-CM | POA: Diagnosis not present

## 2020-01-04 DIAGNOSIS — D701 Agranulocytosis secondary to cancer chemotherapy: Secondary | ICD-10-CM

## 2020-01-04 LAB — CMP (CANCER CENTER ONLY)
ALT: 39 U/L (ref 0–44)
AST: 36 U/L (ref 15–41)
Albumin: 3.2 g/dL — ABNORMAL LOW (ref 3.5–5.0)
Alkaline Phosphatase: 61 U/L (ref 38–126)
Anion gap: 10 (ref 5–15)
BUN: 21 mg/dL (ref 8–23)
CO2: 27 mmol/L (ref 22–32)
Calcium: 10.3 mg/dL (ref 8.9–10.3)
Chloride: 104 mmol/L (ref 98–111)
Creatinine: 0.91 mg/dL (ref 0.44–1.00)
GFR, Est AFR Am: >60 mL/min (ref >60)
GFR, Estimated: >60 mL/min (ref >60)
Glucose, Bld: 92 mg/dL (ref 70–99)
Potassium: 4.3 mmol/L (ref 3.5–5.1)
Sodium: 141 mmol/L (ref 135–145)
Total Bilirubin: 0.4 mg/dL (ref 0.3–1.2)
Total Protein: 6.4 g/dL — ABNORMAL LOW (ref 6.5–8.1)

## 2020-01-04 LAB — CBC WITH DIFFERENTIAL (CANCER CENTER ONLY)
Abs Immature Granulocytes: 0.1 10*3/uL — ABNORMAL HIGH (ref 0.00–0.07)
Basophils Absolute: 0 10*3/uL (ref 0.0–0.1)
Basophils Relative: 0 %
Eosinophils Absolute: 0.1 10*3/uL (ref 0.0–0.5)
Eosinophils Relative: 1 %
HCT: 26.5 % — ABNORMAL LOW (ref 36.0–46.0)
Hemoglobin: 8.2 g/dL — ABNORMAL LOW (ref 12.0–15.0)
Immature Granulocytes: 1 %
Lymphocytes Relative: 14 %
Lymphs Abs: 1.2 10*3/uL (ref 0.7–4.0)
MCH: 29.5 pg (ref 26.0–34.0)
MCHC: 30.9 g/dL (ref 30.0–36.0)
MCV: 95.3 fL (ref 80.0–100.0)
Monocytes Absolute: 1.1 10*3/uL — ABNORMAL HIGH (ref 0.1–1.0)
Monocytes Relative: 13 %
Neutro Abs: 6.5 10*3/uL (ref 1.7–7.7)
Neutrophils Relative %: 71 %
Platelet Count: 236 10*3/uL (ref 150–400)
RBC: 2.78 MIL/uL — ABNORMAL LOW (ref 3.87–5.11)
RDW: 16 % — ABNORMAL HIGH (ref 11.5–15.5)
WBC Count: 9 10*3/uL (ref 4.0–10.5)
nRBC: 0 % (ref 0.0–0.2)

## 2020-01-04 LAB — SAMPLE TO BLOOD BANK

## 2020-01-04 LAB — TSH: TSH: 1.521 u[IU]/mL (ref 0.308–3.960)

## 2020-01-04 LAB — T4, FREE: Free T4: 1.34 ng/dL — ABNORMAL HIGH (ref 0.61–1.12)

## 2020-01-04 LAB — TOTAL PROTEIN, URINE DIPSTICK: Protein, ur: NEGATIVE mg/dL

## 2020-01-04 MED ORDER — SODIUM CHLORIDE 0.9% FLUSH
10.0000 mL | Freq: Once | INTRAVENOUS | Status: AC
  Administered 2020-01-04: 10 mL
  Filled 2020-01-04: qty 10

## 2020-01-04 NOTE — Telephone Encounter (Signed)
[place] with [facility] with referral. She will call patient/husband to set up a time to go out to home.

## 2020-01-04 NOTE — Assessment & Plan Note (Signed)
Even though clinically, she  Has not lost weight, she is still somewhat cachectic She had another ER visit recently I recommend consultation with advanced home care for wound care and monitoring at home to avoid further recurrent hospitalization or ER visit Due to her nonhealing wound, she is not a candidate to resume treatment today I have given her and her husband explicit instruction to hold off restarting Lenvima I have placed urgent consultation for wound care appointment but it is not until next month Unfortunately, she is not able to get an appointment sooner to see her primary care doctor for evaluation Due to progressive anemia, I plan to see her again next week for further follow-up and to provide transfusion support as needed

## 2020-01-04 NOTE — Assessment & Plan Note (Signed)
She has nonhealing ulcer on the left lower extremity with surrounding skin cellulitis The patient gave me permission to take a picture of her wound This is not worse She will continue Keflex for 1 more week I placed dry dressing over the skin ulcer

## 2020-01-04 NOTE — Assessment & Plan Note (Signed)
I suspect this is dependent edema and related to her low albumin status I do not recommend diuretic therapy due to risk of dehydration

## 2020-01-04 NOTE — Assessment & Plan Note (Signed)
She has multifactorial anemia, worse likely due to nonhealing wound I will observe for now I plan to see her back next week and recheck her blood She might need transfusion support next week She denies bleeding

## 2020-01-04 NOTE — Progress Notes (Signed)
[place] OFFICE PROGRESS NOTE  Patient Care Team: Shon Baton, MD as PCP - General (Internal Medicine) Heath Lark, MD as Consulting Physician (Hematology and Oncology)  ASSESSMENT & PLAN:  Endometrial cancer [facility]) Even though clinically, she  Has not lost weight, she is still somewhat cachectic She had another ER visit recently I recommend consultation with advanced home care for wound care and monitoring at home to avoid further recurrent hospitalization or ER visit Due to her nonhealing wound, she is not a candidate to resume treatment today I have given her and her husband explicit instruction to hold off restarting Lenvima I have placed urgent consultation for wound care appointment but it is not until next month Unfortunately, she is not able to get an appointment sooner to see her primary care doctor for evaluation Due to progressive anemia, I plan to see her again next week for further follow-up and to provide transfusion support as needed  Anemia, chronic disease She has multifactorial anemia, worse likely due to nonhealing wound I will observe for now I plan to see her back next week and recheck her blood She might need transfusion support next week She denies bleeding  Cellulitis of left leg She has nonhealing ulcer on the left lower extremity with surrounding skin cellulitis The patient gave me permission to take a picture of her wound This is not worse She will continue Keflex for 1 more week I placed dry dressing over the skin ulcer  Memory impairment I noticed that the patient does not remember well some of the instruction from her prior visits I will consult advanced home care to monitor her situation at home I will see her again next week for further follow-up  Bilateral edema of lower extremity I suspect this is dependent edema and related to her low albumin status I do not recommend diuretic therapy due to risk of dehydration   Orders  Placed This Encounter  Procedures  . Ambulatory referral to Home Health    Referral Priority:   Routine    Referral Type:   Home Health Care    Referral Reason:   Specialty Services Required    Requested Specialty:   [place]    Number of Visits Requested:   1    All questions were answered. The patient knows to call the clinic with any problems, questions or concerns. The total time spent in the appointment was 30 minutes encounter with patients including review of chart and various tests results, discussions about plan of care and coordination of care plan   Heath Lark, MD 01/04/2020 2:01 PM  INTERVAL HISTORY: Please see below for problem oriented charting. She returns with her husband for further follow-up They went to the emergency room department 2 days ago due to persistent leg swelling The husband asked for more Lasix refill He told me the Lasix did not work and he was only given a small amount I asked him, if Lasix did not work, why would he need refill on that? He does not know the answer They did not recall our previous conversation about wound care and antibiotics and I specifically stated I do not recommend Lasix because I do not believe this is due to congestive heart failure or fluid overload The patient is prone to get dehydrated in the past and I believe the cause of her leg swelling was because of fluid retention from recent prednisone therapy as well as dependent edema and third spacing from low  albumin He is frustrated that wound care consult is not until next month and is wondering whether I could make it happen faster I have suggested advanced home care service through my nursing staff recently but the patient declined recently.   They are interested for the enrollment in advanced home care now to avoid ER visit She is putting dressing changes over her wound on a regular basis Denies fever or chills No recent bleeding  SUMMARY OF ONCOLOGIC  HISTORY: Oncology History Overview Note  Biopsy of recurrence 06-2014 ER negative. PDL1 testing low at 2% on the uterine cancer She progressed on carboplatin and Paclitaxel for uterine cancer HER 2 negative on pathology [id number] Negative genetic testing  On her lung cancer sample, [employer] [facility] testing revealed 10% PD-1 positive EGFR mutation was detected, exon 19 deletion   Endometrial cancer ([place])  12/01/2007 Initial Diagnosis   She has recurrent papillary serous endometrial carcinoma. Briefly she was diagnosed in 2009 with a FIGO IB pappilary serous carcinoma treated with hysterectomy followed by adjuvant chemo and vaginal brachytherapy. She then had a recurrence diagnosed in late 2012 that was deemed not to be surgically resectable, and was treated with 6 cycles of carbo/taxol followed by 5040 cGy of EBRT to the pelvic mass. She was then followed with serial imaging and was noted in June of this year to have slight increase in the size of the mass, and again in September there was small enlargement of the mass. She had a re-staging PET scan on 03/10/13 that showed FDG activity in the pelvic mass, with no other areas of disease   12/01/2007 Imaging   CT scan of chest, abdomen and pelvis: 1.  Mass like area of decreased attenuation in the central uterus, consistent with the known endometrial carcinoma.  Deep myometrial invasion is suspected.  Pelvic MRI without and with contrast could be performed for further staging workup if clinically warranted. 2.  No evidence of extrauterine extension or lymphadenopathy. 3.  Sigmoid diverticulosis incidentally noted.   12/15/2007 Surgery   --12/2007 laparoscopic hysterectomy and PLND --05/2008 complted 6 cycles adjuvant carbo/taxol followed by vaginal cuff brachy --03/2011 exploratory lararoscopy, ureterolysis --4/13 completed 6 cycels carbotaxol --8/13 completed EBRT to the left pelvic sidewall    06/10/2008 Imaging   CT scan abdomen and pelvis 1.   Nonspecific mildly prominent inguinal lymph nodes, right greater than left. 2.  Interval hysterectomy without evidence of recurrent pelvic mass or fluid collection.   03/22/2011 Imaging   Ct scan abdomen and pelvis 1.  Local uterine cancer recurrence with two large nodes along the left pelvic sidewall. 2.  No evidence of bowel obstruction, urinary obstruction, or more distant metastasis.   03/31/2011 Relapse/Recurrence   chemotherapy and external beam radiation   05/16/2011 - 09/17/2011 Chemotherapy   She had 6 cycles of carboplatin and Taxol    07/17/2011 Imaging   Ct scan abdomen and pelvis 1.  Interval improvement in the previously demonstrated left pelvic local recurrence of tumor. 2.  No disease progression or complication identified. 3.  Mild bladder wall thickening on the right, nonspecific and possibly related to incomplete distension and/or radiation therapy. 4.  Cholelithiasis   10/11/2011 Imaging   CT scan abdomen and pelvis 1.  Interval enlargement of the left pelvic sidewall masses. 2.  No new nodular disease in the pelvis or lymphadenopathy. 3.  No evidence  of distant metastasis. 4.  Mild hydronephrosis on the right is similar to prior. 5.  Focal thickening of the right  aspect the bladder is stable compared to prior.    10/28/2011 - 12/05/2011 Radiation Therapy   radiation for pelvic sidewall recurrence   10/28/2011 - 12/05/2011 Radiation Therapy   10/28/11-12/05/11: Radiotherapy to the left pelvic sidewall   03/18/2012 Imaging   CT abdomen 1.  Today's study demonstrates a positive response to therapy with decreased size of left pelvic sidewall nodal masses, as detailed above.  No new soft tissue masses or new lymphadenopathy is identified within the abdomen or pelvis. Continue attention on follow-up studies is recommended. 2.  Cholelithiasis without findings to suggest acute cholecystitis. 3.  Colonic diverticulosis without findings to suggest acute diverticulitis at this  time. 4.  Mild asymmetric urinary bladder wall thickening is unchanged compared to prior examinations and is nonspecific.  No definite bladder wall mass is identified at this time. 5.  Additional findings, similar to prior examinations, as above   06/22/2012 Imaging   CT abdomen 1.  Further decreased size of the left pelvic peripherally enhancing lesion. 2.  No new sites of new or progressive disease. 3. Esophageal air fluid level suggests dysmotility or gastroesophageal reflux. 4.  Cholelithiasis   11/02/2012 Imaging   CT abdomen 1.  The left pelvic side wall lesion demonstrates mild increase in size from previous exam. 2.  No new sites of new or progressive disease. 3.  Cholelithiasis.    01/28/2013 Imaging   CT abdomen 1. Interval small increase in volume of enhancing mass adjacent to the left pelvic sidewall. 2. No evidence of abdominal or pelvic lymphadenopathy   04/02/2013 Surgery   pelvic mass resection with IORT   04/02/2013 - 04/02/2013 Radiation Therapy   04/02/13: Intraoperative radiotherapy to the left pelvis   05/10/2013 PET scan   1. Hypermetabolic mass in the deep left pelvis consistent with metastasis. 2. No additional evidence of local metastasis. No evidence of distant metastasis. 3. Small right lower lobe pulmonary nodule is not hypermetabolic.   07/27/2013 Imaging   No evidence of recurrent or metastatic disease. Apparent prior resection of the deep left pelvic metastasis.   02/08/2014 Imaging   No CT findings for recurrent or metastatic disease involving the abdomen/ pelvis   06/13/2014 Relapse/Recurrence   + recurrence paraspinous muscle   06/21/2014 Procedure   There is a soft tissue lesion along the left side of L3-L4. This lesion roughly measures up to 2.2 cm. Needle was positioned along the posterior aspect of the lesion. Small amount of air in the paraspinal tissues following needle removal   06/21/2014 Pathology Results   Bone, biopsy, left lumbar  paraspinal - METASTATIC POORLY DIFFERENTIATED CARCINOMA, CONSISTENT WITH HIGH GRADE SEROUS CARCINOMA SEE COMMENT. Microscopic Comment The carcinoma demonstrates the following immunophenotype: Cytokeratin 7 - patchy moderate to strong expression Estrogen receptor - negative expression P53 - strong diffuse expression TTF-1 - negative expression WT-1 - negative expression CD56 - focal moderate strong expression Synaptophysin - negative expression GCDFP - negative expression The history of primary endometrial papillary serous carcinoma and primary mammary carcinoma is noted. In the current case, the overall morphologic and immunophenotype are that of poorly differentiated carcinoma, consistent with high grade serous carcinoma.    07/11/2014 - 08/12/2014 Radiation Therapy   07/11/14-08/12/14: 55 Gy in 25 fractions to the Lumbar spine   08/15/2014 Imaging   CT scan of chest, abdomen and pelvis 1. Since the biopsy study of 06/21/2014, similar to slight decrease in size of a left paraspinous lesion at L3-4. 2. No new sites of metastatic disease identified. 3.  8 mm ground-glass nodule in the right lower lobe is grossly similar to 03/10/2013, suggesting a benign etiology. 4. Cholelithiasis. 5. Apparent sigmoid colonic wall thickening which could be due to underdistention. Colitis felt less likely. 6.  Atherosclerosis, including within the coronary arteries. 7. Pelvic floor laxity.   10/07/2014 Imaging   CT scan of chest, abdomen and pelvis: Stable to slight interval decrease in size of left paraspinous lesion at L3-4. No new sites of metastatic disease. Unchanged ground-glass nodule within the right lower lobe, potentially benign in etiology. Recommend attention on followup. Cholelithiasis. Sigmoid colonic diverticulosis. No CT evidence to suggest acute diverticulitis.   01/19/2015 Imaging   Ct scan of abdomen and pelvis 1. Decrease in size of left paraspinous metastasis. 2. No new sites of  disease. 3. Stable ground-glass attenuating nodule within the superior segment of right lower lobe. 4. Gallstones   05/02/2015 Imaging   Ct abdomen and pelvis: Slight interval increase in size of left lumbar paraspinal soft tissue metastasis. No new sites of metastatic disease identified within the abdomen or pelvis.    05/11/2015 PET scan   1. The small soft tissue mass just lateral to the left L3 for neural foramen is hypermetabolic, favoring malignancy. 2. There is also a small focus of hypermetabolic activity between the left L1 and L2 transverse processes, but without a CT correlate. 3. The 13 mm sub solid nodule in the superior segment right lower lobe is stable from recent exams but has slowly increased in size over the last 7 years. Although not hypermetabolic, the appearance is concerning for the possibility of a low grade adenocarcinoma. 4. Other imaging findings of potential clinical significance: Chronic bilateral maxillary sinusitis. Coronary, aortic arch, and branch vessel atherosclerotic vascular disease. Aortoiliac atherosclerotic vascular disease. Cholelithiasis. Sigmoid colon diverticulosis. Lumbar scoliosis.   07/11/2015 Imaging   Ct scan of chest, abdomen and pelvis: 1. 13 mm mixed solid and sub solid nodule in the posterior right lower lobe was present in 2009 and has clearly progressed in the interval since that study. Imaging features remain highly concerning for low-grade or well differentiated adenocarcinoma. 2. Slight increase size of in the hypermetabolic, rim enhancing nodule identified adjacent to the left L3-4 neural foramen. Metastatic disease remains a concern. 3. No evidence for discrete soft tissue lesion between the left L1 and 2 transverse processes, the site of focal FDG uptake on the recent PET-CT. 4. Cholelithiasis. 5. Abdominal aortic atherosclerosis   08/03/2015 - 01/16/2016 Chemotherapy   She received carboplatin and Taxol   11/06/2015 PET scan   1.  Hypermetabolic left I3-3 paraspinous metastasis (biopsy-proven) is stable in size and slightly decreased in metabolism. 2. Previously described small focus of hypermetabolism between the left L2 and L3 spinous processes has resolved. 3. New mild linear hypermetabolism to the left of the T11-12 spinous processes without discrete mass on the CT images, favor benign activity related activity, recommend attention on follow-up PET-CT.  4. No definite new sites of hypermetabolic metastatic disease. No recurrent hypermetabolic metastatic disease in the pelvis. 5. Interval stability of subsolid 1.3 cm superior segment right lower lobe pulmonary nodule without associated significant metabolism, which has grown compared to the 2009 chest CT study, and remain suspicious for low grade adenocarcinoma. 6. Additional findings include aortic atherosclerosis, coronary atherosclerosis, mild multinodular goiter with no hypermetabolic thyroid nodules, cholelithiasis and moderate sigmoid diverticulosis.   02/12/2016 PET scan   1. Interval increase in size and metabolic activity of the LEFT paraspinal soft tissue metastasis. 2. New  activity within the musculature of the LEFT chest wall. Given the unusual location of the paraspinal metastasis cannot exclude a second soft tissue metastasis to the musculature however favor benign physiologic activity. 3. Stable RIGHT lower lobe pulmonary nodule. 4. No evidence of local recurrence.     04/08/2016 Imaging   Ct scan of chest, abdomen and pelvis: 1. Similar size of a  left paravertebral abdominal lesion. 2. No new or progressive metastatic disease. 3. No correlate for the muscular activity about the lateral left chest wall. 4.  Coronary artery atherosclerosis. Aortic atherosclerosis. 5. Cholelithiasis. 6. Similar right lower lobe pulmonary nodule.   06/03/2016 Imaging   CT abdomen and pelvis 1. Similar to mild enlargement of a paravertebral soft tissue lesion at the  L3-4 level. 2. No new sites of disease identified. 3. Cholelithiasis. 4. Hysterectomy. 5.  Aortic atherosclerosis.    09/02/2016 Imaging   CT abdomen and pelvis 1. Continue mild interval increase in size of LEFT paraspinal mass (1-2 mm). 2. No evidence of new metastatic disease in the abdomen pelvis. 3. Post hysterectomy anatomy   09/26/2016 Imaging   MR lumbar spine 1. Left paraspinal metastasis with epicenter adjacent to the left L3 neural foramen does extend into the foramen to the level of the left lateral epidural space (series 9, image 31), but also tracks cephalad and caudal along the L2-L4 lumbar plexus (series 10, image 15). No extension into the left L2 or L4 neural foramina. And no more distal extension of tumor. 2. Abnormal signal in the medial left psoas and ventral left erector spinae muscles at L2 and L3 is probably denervation related. 3. No bone invasion or osseous metastatic disease. Suspect previous radiation of the L2 through L5 spinal levels. 4. No dural or intradural metastatic disease.    10/22/2016 Procedure   She underwent stereotactic Radiosurgery   10/30/2016 Genetic Testing   Patient has genetic testing done for Inheritable genetic mutation panel. Results revealed patient has no actionable mutation.   01/21/2017 Imaging   1. Reduced size and conspicuity of the left paraspinal mass at the L3-4 level. The mass still present but has enhancement similar to that of adjacent psoas musculature. 2. No new metastatic disease is identified. 3. Other imaging findings of potential clinical significance: Cholelithiasis. Aortic Atherosclerosis (ICD10-I70.0). Sigmoid diverticulosis. Lumbar scoliosis.   05/09/2017 Imaging   Ct scan of abdomen and pelvis 1. Paraspinal mass adjacent to the left side of L3-L4 is less distinct than prior examinations, and is therefore difficult to discretely measure, however, the overall appearance suggests continued positive response to  therapy. 2. No new signs of metastatic disease elsewhere in the abdomen or pelvis. 3. Aortic atherosclerosis, in addition to at least right coronary artery disease. Assessment for potential risk factor modification, dietary therapy or pharmacologic therapy may be warranted, if clinically indicated. 4. Colonic diverticulosis without evidence of acute diverticulitis at this time. 5. Additional incidental findings, as above. Aortic Atherosclerosis (ICD10-I70.0).   08/14/2017 Imaging   1. Treated left paraspinal mass centered at L3-4. Mass size is stable but there has been some evolution of tumor characteristics with less central fluid seen today. Recommend continued surveillance. 2. No evidence of untreated metastasis. 3. Degenerative changes and related impingement are described above.   10/28/2017 Imaging   Stable small left paraspinal soft tissue mass. No new or progressive disease within the abdomen or pelvis.  Cholelithiasis.  No radiographic evidence of cholecystitis.  Colonic diverticulosis, without radiographic evidence of diverticulitis.   04/30/2018 Imaging  Stable post treatment change of the partially necrotic LEFT paravertebral mass at L3-L4. 18 x 21 mm cross-section. Mass effect on the LEFT L3 nerve root redemonstrated. Enhancing and atrophic LEFT psoas is inseparable.   10/15/2018 Imaging   1. Stable post treatment size and appearance of partially necrotic left paravertebral mass at L3-4, measuring 19 x 25 mm on current exam (unchanged when measured at similar level on previous study). Mass effect on the adjacent left L3 nerve root is unchanged. Adjacent enhancing and atrophic left psoas muscle. 2. Left convex scoliosis with associated multilevel degenerative spondylolysis and facet arthrosis, unchanged   10/29/2018 Imaging   Stable small left L3 paraspinal soft tissue mass. No evidence of new or progressive metastatic disease, or other acute findings.   Cholelithiasis.  No  radiographic evidence of cholecystitis.   Colonic diverticulosis, without radiographic evidence of diverticulitis.   11/11/2018 PET scan   1. Low level metabolism (max SUV 2.0) associated with the irregular subsolid superior segment right lower lobe 2.1 cm pulmonary nodule. As better depicted on the recent diagnostic chest CT study, this nodule crosses the major fissure into the right upper lobe and has increased in size and density on multiple imaging studies back to 2009. Findings are most suggestive of primary bronchogenic adenocarcinoma. 2. No hypermetabolic thoracic adenopathy. 3. Persistent hypermetabolism (max SUV 8.0) associated with the known left L3-4 paraspinous metastasis, mildly decreased in metabolism since 02/12/2016 PET-CT. 4. No additional sites of hypermetabolic metastatic disease in the abdomen or pelvis. No metabolic evidence of peritoneal recurrence. No ascites. 5. Chronic findings include: Aortic Atherosclerosis (ICD10-I70.0). Coronary atherosclerosis. Chronic bilateral maxillary sinusitis. Cholelithiasis. Moderate sigmoid diverticulosis.   01/07/2019 - 04/04/2019 Chemotherapy   The patient had carboplatin and Alimta for chemotherapy treatment.     05/03/2019 -  Chemotherapy   The patient had Pembrolizumab and Lenvima for chemotherapy treatment.     07/23/2019 PET scan   1. 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 No new sites of hypermetabolic metastatic disease on today's study. 2. Stable appearance of surgical changes in the right lung without features to suggest local recurrence or metastatic lung cancer. 3. Cholelithiasis. 4.  Aortic Atherosclerois (ICD10-170.0)   10/20/2019 Imaging   Bilateral venous Doppler US RIGHT:  - Findings consistent with acute and occlusive deep vein thrombosis involving the right common femoral vein, right external iliac vein, right femoral vein, right popliteal vein, right posterior  tibial veins, and right peroneal veins.   Unable to adequately visualize the common iliac veins due to bowel gas. The imaged portions of the IVC were patent.     LEFT:  - No evidence of common femoral vein obstruction.    11/08/2019 PET scan   1. Further reduction in activity at the left L3 level and left paraspinal tissues at L3. Maximum vertebral SUV currently 3.9, previously 4.8 on 07/23/2019 and previously 10.0 on 04/14/2019. 2. Late phase healing of previous left transverse process fractures at L3 and L4. Suspected healing right sixth rib fracture anteriorly. 3. There is new focal activity in the vicinity of the right internal jugular vein in the lower neck. Maximum SUV in this vicinity is 5.1. I not see a well-defined lymph node are thyroid lesion to corroborate with the focus, but surveillance in this region is suggested. 4. Other imaging findings of potential clinical significance: Aortic Atherosclerosis (ICD10-I70.0). Coronary atherosclerosis. Cholelithiasis. Sigmoid colon diverticulosis.   Metastatic cancer to bone ([place])  06/27/2014 Initial Diagnosis  Metastatic cancer to bone   Primary lung cancer, right ([place])  11/04/2018 Initial Diagnosis   Primary lung cancer, right ([place])   11/11/2018 PET scan   1. Low level metabolism (max SUV 2.0) associated with the irregular subsolid superior segment right lower lobe 2.1 cm pulmonary nodule. As better depicted on the recent diagnostic chest CT study, this nodule crosses the major fissure into the right upper lobe and has increased in size and density on multiple imaging studies back to 2009. Findings are most suggestive of primary bronchogenic adenocarcinoma. 2. No hypermetabolic thoracic adenopathy. 3. Persistent hypermetabolism (max SUV 8.0) associated with the known left L3-4 paraspinous metastasis, mildly decreased in metabolism since 02/12/2016 PET-CT. 4. No additional sites of hypermetabolic metastatic disease in the abdomen or pelvis.  No metabolic evidence of peritoneal recurrence. No ascites. 5. Chronic findings include: Aortic Atherosclerosis (ICD10-I70.0). Coronary atherosclerosis. Chronic bilateral maxillary sinusitis. Cholelithiasis. Moderate sigmoid diverticulosis.   12/07/2018 Pathology Results   1. Lung, resection (segmental or lobe), Right Lobe Superior with endblock wedge of right upper lobe - INVASIVE ADENOCARCINOMA, MODERATELY DIFFERENTIATED, SPANNING 1.6 CM. - THE SURGICAL RESECTION MARGINS ARE NEGATIVE FOR CARCINOMA. - SEE ONCOLOGY TABLE BELOW. 2. Lymph node, biopsy, Level 9 #1 - THERE IS NO EVIDENCE OF CARCINOMA IN 1 OF 1 LYMPH NODE (0/1). 3. Lymph node, biopsy, Level 9 #2 - THERE IS NO EVIDENCE OF CARCINOMA IN 1 OF 1 LYMPH NODE (0/1). 4. Lymph node, biopsy, Level 7 #1 - METASTATIC CARCINOMA IN 1 OF 1 LYMPH NODE (1/1). 5. Lymph node, biopsy, Level 7 #2 - THERE IS NO EVIDENCE OF CARCINOMA IN 1 OF 1 LYMPH NODE (0/1). 6. Lymph node, biopsy, Level 12 #1 - THERE IS NO EVIDENCE OF CARCINOMA IN 1 OF 1 LYMPH NODE (0/1). 7. Lymph node, biopsy, Level 12 #2 - THERE IS NO EVIDENCE OF CARCINOMA IN 1 OF 1 LYMPH NODE (0/1). 8. Lymph node, biopsy, Level 10 #1 - THERE IS NO EVIDENCE OF CARCINOMA IN 1 OF 1 LYMPH NODE (0/1). 9. Lymph node, biopsy, Level 4R #1 - THERE IS NO EVIDENCE OF CARCINOMA IN 1 OF 1 LYMPH NODE (0/1). 10. Lymph node, biopsy, Level 4R #2 - THERE IS NO EVIDENCE OF CARCINOMA IN 1 OF 1 LYMPH NODE (0/1). 11. Lymph node, biopsy, Level 2R #1 - THERE IS NO EVIDENCE OF CARCINOMA IN 1 OF 1 LYMPH NODE (0/1). 12. Lung, resection (segmental or lobe), Right Lobe Superior - BENIGN LUNG PARENCHYMA. - THERE IS NO EVIDENCE OF MALIGNANCY. Procedure: Segmentectomy. Specimen Laterality: Right. Tumor Site: Right superior lobe. Tumor Size: 1.6 cm (gross measurement). Tumor Focality: Unifocal. Histologic Type: Adenocarcinoma, moderately differentiated. Visceral Pleura Invasion: Not identified. Lymphovascular  Invasion: Not identified. Direct Invasion of Adjacent Structures: Not identified. Margins: Negative for carcinoma. Treatment Effect: N/A Regional Lymph Nodes: Number of Lymph Nodes Involved: 1 (level 7) Number of Lymph Nodes Examined: 10 Pathologic Stage Classification (pTNM, AJCC 8th Edition): pT1b, pN2 Ancillary Studies: The tumor cells are positive for Napsin-A, TTF-1, and p53. They are essentially negative for estrogen receptor, PAX-8 and progesterone receptor. Additional studies can be performed upon clinician request. Representative Tumor Block: 1D-1E. (JBK:gt, 12/09/18)   12/07/2018 Surgery   PREOPERATIVE DIAGNOSIS:  Right lung nodule.   POSTOPERATIVE DIAGNOSIS:  Non-small cell carcinoma- suspected primary lung carcinoma, clinical stage T2N01b.   PROCEDURE:   Right video-assisted thoracoscopy, Right lower lobe superior segmentectomy with en bloc wedge resection of right upper lobe, Lymph node dissection, Intercostal nerve block levels 3-9.   SURGEON:  Modesto Charon, MD   FINDINGS:  Mass palpable in posterior aspect of superior segment of right lower lobe, likely involvement across fissure into the upper lobe.  Frozen section revealed non-small cell carcinoma. upper and lower lobe margins clear.   12/23/2018 Cancer Staging   Staging form: Lung, AJCC 8th Edition - Pathologic: Stage IIIA (pT1b, pN2, cM0) - Signed by Heath Lark, MD on 12/23/2018   01/05/2019 Imaging   MRI brain 1. No metastatic disease or acute intracranial abnormality identified. 2. Small chronic parafalcine meningioma size is stable since that described in 2006 (12 x 15 mm). 3. Advanced signal changes in the cerebral white matter and to a lesser extent pons, nonspecific but most commonly due to chronic small vessel disease. 4. Partially visible degenerative cervical spinal stenosis.     01/07/2019 -  Chemotherapy   The patient had carboplatin and Alimta for chemotherapy treatment.     04/14/2019 PET scan    1. 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 As shown on recent MRI of 04/01/2019, there is a new component of invasion into the left lower L3 vertebral body. This new component has a maximum SUV of 10.0, compatible with active malignancy. 2. Interval wedge resection of portions of the right upper lobe and right lower lobe at the site of the prior nodule. There is only minimal low-grade activity along the wedge resection clips, maximum SUV of 1.5. Surveillance is likely warranted. No new nodule identified. 3. Other imaging findings of potential clinical significance: Aortic Atherosclerosis (ICD10-I70.0). Coronary atherosclerosis. Thoracolumbar scoliosis. Cholelithiasis. Sigmoid colon diverticulosis.     05/03/2019 -  Chemotherapy   The patient had Pembrolizumab and Lenvima for chemotherapy treatment.     05/22/2019 Imaging   Ct head Atrophy, chronic microvascular disease.   No acute intracranial abnormality.       REVIEW OF SYSTEMS:   Constitutional: Denies fevers, chills or abnormal weight loss Eyes: Denies blurriness of vision Ears, nose, mouth, throat, and face: Denies mucositis or sore throat Respiratory: Denies cough, dyspnea or wheezes Cardiovascular: Denies palpitation, chest discomfort  Gastrointestinal:  Denies nausea, heartburn or change in bowel habits Lymphatics: Denies new lymphadenopathy or easy bruising Neurological:Denies numbness, tingling or new weaknesses Behavioral/Psych: Mood is stable, no new changes  All other systems were reviewed with the patient and are negative.  I have reviewed the past medical history, past surgical history, social history and family history with the patient and they are unchanged from previous note.  ALLERGIES:  is allergic to ace inhibitors, dilaudid [hydromorphone hcl], and codeine.  MEDICATIONS:  Current Outpatient Medications  Medication Sig Dispense Refill  . amLODipine (NORVASC) 5 MG tablet TAKE 1  TABLET BY MOUTH EVERY DAY 90 tablet 1  . atenolol (TENORMIN) 25 MG tablet Take 25 mg by mouth 2 (two) times daily.     . cephALEXin (KEFLEX) 500 MG capsule Take 1 capsule (500 mg total) by mouth 2 (two) times daily. 28 capsule 0  . furosemide (LASIX) 20 MG tablet Take 1 tablet (20 mg total) by mouth daily. 10 tablet 0  . HYDROcodone-acetaminophen (NORCO/VICODIN) 5-325 MG tablet Take 1 tablet by mouth every 4 (four) hours as needed. 12 tablet 0  . lenvatinib 10 mg daily dose (LENVIMA, 10 MG DAILY DOSE,) capsule Take 1 capsule every other day by mouth 30 capsule 11  . lidocaine-prilocaine (EMLA) cream Apply to [facility] cath 1-2 hours prior to access as directed 30 g 1  . LORazepam (ATIVAN) 0.5 MG tablet 1  tablet po 30 minutes prior to radiation or MRI 30 tablet 0  . memantine (NAMENDA) 5 MG tablet TAKE 1 TABLET DAILY FOR ONE WEEK, THEN TAKE 1 TABLET TWICE DAILY FOR ONE WEEK, THEN TAKE 1 TABLET IN THE MORNING AND 2 IN THE EVENING FOR ONE WEEK, THEN TAKE 2 TABLETS TWICE DAILY 210 tablet 1  . mirtazapine (REMERON) 7.5 MG tablet TAKE 1 TABLET (7.5 MG TOTAL) BY MOUTH AT BEDTIME. 90 tablet 2  . Multiple Vitamin (MULTIVITAMIN WITH MINERALS) TABS tablet Take 1 tablet by mouth daily.    . potassium chloride (KLOR-CON) 10 MEQ tablet Take 1 tablet (10 mEq total) by mouth daily. 10 tablet 0  . rivaroxaban (XARELTO) 10 MG TABS tablet Take 1 tablet (10 mg total) by mouth daily. 30 tablet 11  . simvastatin (ZOCOR) 20 MG tablet Take 20 mg by mouth every evening.      No current facility-administered medications for this visit.   Facility-Administered Medications Ordered in Other Visits  Medication Dose Route Frequency Provider Last Rate Last Admin  . heparin lock flush 100 unit/mL  500 Units Intracatheter Once Analynn Daum, MD      . sodium chloride flush (NS) 0.9 % injection 10 mL  10 mL Intracatheter Once Bertis Ruddy, Shalen Petrak, MD        PHYSICAL EXAMINATION: ECOG PERFORMANCE STATUS: 1 - Symptomatic but completely  ambulatory  Vitals:   01/04/20 1320  BP: (!) 155/64  Pulse: 78  Resp: 18  SpO2: 100%   Filed Weights   01/04/20 1320  Weight: 104 lb 6.4 oz (47.4 kg)    GENERAL:alert, no distress and comfortable.  She looks thin and cachectic SKIN: Please see picture below.  She still have nonhealing wound on the left shin with surrounding erythema changes worrisome for cellulitis  NEURO: alert & oriented x 3 with fluent speech, no focal motor/sensory deficits.  Noted poor memory recall  LABORATORY DATA:  I have reviewed the data as listed    Component Value Date/Time   NA 141 01/04/2020 1310   NA 140 05/09/2017 0844   K 4.3 01/04/2020 1310   K 4.1 05/09/2017 0844   CL 104 01/04/2020 1310   CO2 27 01/04/2020 1310   CO2 26 05/09/2017 0844   GLUCOSE 92 01/04/2020 1310   GLUCOSE 82 05/09/2017 0844   BUN 21 01/04/2020 1310   BUN 20.7 05/09/2017 0844   CREATININE 0.91 01/04/2020 1310   CREATININE 0.7 05/09/2017 0844   CALCIUM 10.3 01/04/2020 1310   CALCIUM 9.3 05/09/2017 0844   PROT 6.4 (L) 01/04/2020 1310   PROT 6.8 05/09/2017 0844   ALBUMIN 3.2 (L) 01/04/2020 1310   ALBUMIN 3.6 05/09/2017 0844   AST 36 01/04/2020 1310   AST 27 05/09/2017 0844   ALT 39 01/04/2020 1310   ALT 17 05/09/2017 0844   ALKPHOS 61 01/04/2020 1310   ALKPHOS 43 05/09/2017 0844   BILITOT 0.4 01/04/2020 1310   BILITOT 0.81 05/09/2017 0844   GFRNONAA >60 01/04/2020 1310   GFRAA >60 01/04/2020 1310    No results found for: SPEP, UPEP  Lab Results  Component Value Date   WBC 9.0 01/04/2020   NEUTROABS 6.5 01/04/2020   HGB 8.2 (L) 01/04/2020   HCT 26.5 (L) 01/04/2020   MCV 95.3 01/04/2020   PLT 236 01/04/2020      Chemistry      Component Value Date/Time   NA 141 01/04/2020 1310   NA 140 05/09/2017 0844   K 4.3 01/04/2020 1310  K 4.1 05/09/2017 0844   CL 104 01/04/2020 1310   CO2 27 01/04/2020 1310   CO2 26 05/09/2017 0844   BUN 21 01/04/2020 1310   BUN 20.7 05/09/2017 0844   CREATININE 0.91  01/04/2020 1310   CREATININE 0.7 05/09/2017 0844      Component Value Date/Time   CALCIUM 10.3 01/04/2020 1310   CALCIUM 9.3 05/09/2017 0844   ALKPHOS 61 01/04/2020 1310   ALKPHOS 43 05/09/2017 0844   AST 36 01/04/2020 1310   AST 27 05/09/2017 0844   ALT 39 01/04/2020 1310   ALT 17 05/09/2017 0844   BILITOT 0.4 01/04/2020 1310   BILITOT 0.81 05/09/2017 0844

## 2020-01-04 NOTE — Assessment & Plan Note (Signed)
I noticed that the patient does not remember well some of the instruction from her prior visits I will consult advanced home care to monitor her situation at home I will see her again next week for further follow-up

## 2020-01-05 DIAGNOSIS — R413 Other amnesia: Secondary | ICD-10-CM | POA: Diagnosis not present

## 2020-01-05 DIAGNOSIS — C541 Malignant neoplasm of endometrium: Secondary | ICD-10-CM | POA: Diagnosis not present

## 2020-01-05 DIAGNOSIS — Z4800 Encounter for change or removal of nonsurgical wound dressing: Secondary | ICD-10-CM | POA: Diagnosis not present

## 2020-01-05 DIAGNOSIS — D630 Anemia in neoplastic disease: Secondary | ICD-10-CM | POA: Diagnosis not present

## 2020-01-05 DIAGNOSIS — C55 Malignant neoplasm of uterus, part unspecified: Secondary | ICD-10-CM | POA: Diagnosis not present

## 2020-01-05 DIAGNOSIS — S81802D Unspecified open wound, left lower leg, subsequent encounter: Secondary | ICD-10-CM | POA: Diagnosis not present

## 2020-01-05 DIAGNOSIS — I10 Essential (primary) hypertension: Secondary | ICD-10-CM | POA: Diagnosis not present

## 2020-01-05 DIAGNOSIS — E785 Hyperlipidemia, unspecified: Secondary | ICD-10-CM | POA: Diagnosis not present

## 2020-01-05 DIAGNOSIS — Z792 Long term (current) use of antibiotics: Secondary | ICD-10-CM | POA: Diagnosis not present

## 2020-01-05 DIAGNOSIS — R64 Cachexia: Secondary | ICD-10-CM | POA: Diagnosis not present

## 2020-01-05 DIAGNOSIS — K5790 Diverticulosis of intestine, part unspecified, without perforation or abscess without bleeding: Secondary | ICD-10-CM | POA: Diagnosis not present

## 2020-01-05 DIAGNOSIS — Z9181 History of falling: Secondary | ICD-10-CM | POA: Diagnosis not present

## 2020-01-05 DIAGNOSIS — R600 Localized edema: Secondary | ICD-10-CM | POA: Diagnosis not present

## 2020-01-05 DIAGNOSIS — L03116 Cellulitis of left lower limb: Secondary | ICD-10-CM | POA: Diagnosis not present

## 2020-01-05 DIAGNOSIS — Z7901 Long term (current) use of anticoagulants: Secondary | ICD-10-CM | POA: Diagnosis not present

## 2020-01-05 DIAGNOSIS — C7951 Secondary malignant neoplasm of bone: Secondary | ICD-10-CM | POA: Diagnosis not present

## 2020-01-05 DIAGNOSIS — C3431 Malignant neoplasm of lower lobe, right bronchus or lung: Secondary | ICD-10-CM | POA: Diagnosis not present

## 2020-01-05 DIAGNOSIS — Z90710 Acquired absence of both cervix and uterus: Secondary | ICD-10-CM | POA: Diagnosis not present

## 2020-01-05 DIAGNOSIS — I700 Atherosclerosis of aorta: Secondary | ICD-10-CM | POA: Diagnosis not present

## 2020-01-05 NOTE — ED Provider Notes (Signed)
[facility] DEPT Provider Note   CSN: [id number] Arrival date & time: 01/02/20  1324     History Chief Complaint  Patient presents with  . Leg Swelling    [name] is a 79 y.o. female.  HPI   79 year old female with lower extremity swelling.  Worsening over the past couple weeks.  She discussed with her oncologist recently.  Initially felt that symptoms may be secondary to prednisone.  She has since stopped taking this but symptoms have not improved.  She has no respiratory complaints.  No orthopnea.  No known history of any renal or hepatic dysfunction.  Past Medical History:  Diagnosis Date  . Endometrial cancer ([place]) 12/2007   s/p total abdominal hysterectomy at [facility] - ovaries were also removed   . Family history of breast cancer   . History of breast cancer    right breast -- treated with lumpectomy and radiation therarpy, postoperatively with tamoxifen   . History of radiation therapy 10/22/2016 to 10/28/2016  . Hypercholesterolemia   . Hyperlipidemia   . Hypertension   . Palpitations   . Personal history of radiation therapy 2006  . PVC's (premature ventricular contractions)   . Radiation 07/11/14-08/12/14   left lumbar paraspinal area 55 gray  . S/P radiation therapy 10/28/11-12/05/11   5040 cGy left pelvis  . S/P radiation therapy    Intracavitary brachytherapy of uterus    Patient Active Problem List   Diagnosis Date Noted  . Cellulitis of left leg 12/28/2019  . Anemia, chronic disease 12/28/2019  . Bilateral edema of lower extremity 12/28/2019  . Diarrhea 12/16/2019  . Memory impairment 11/29/2019  . 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 Deep vein blood clot of right lower extremity ([place]) 11/08/2019  . Recurrent epistaxis 11/08/2019  . Elevated serum creatinine 08/16/2019  . Acquired hypothyroidism 07/26/2019  . TIA (transient ischemic attack) 05/25/2019  . Goals of care, counseling/discussion 04/19/2019  .  Dehydration 01/14/2019  . Pancytopenia, acquired ([place]) 01/14/2019  . Dysuria 12/29/2018  . Right lower lobe pulmonary nodule 12/07/2018  . Primary lung cancer, right ([place]) 11/04/2018  . Essential hypertension 01/24/2017  . Genetic testing 10/31/2016  . History of breast cancer   . Family history of breast cancer   . Aortic atherosclerosis ([place]) 06/08/2016  . Plantar fasciitis of left foot 12/13/2015  . Anemia due to antineoplastic chemotherapy 11/14/2015  . Lactose intolerance in adult 11/14/2015  . Chemotherapy-induced neuropathy ([place]) 10/08/2015  . Dense breast tissue 10/08/2015  . Port catheter in place 09/28/2015  . Central line complication 40/02/2724  . Chemotherapy induced neutropenia ([place]) 08/22/2015  . Arterial hypotension 08/12/2015  . HX: breast cancer 02/07/2015  . Metastatic cancer to bone ([place]) 06/27/2014  . Hypotension 06/27/2011  . Endometrial cancer ([place]) 05/15/2011  . Hypercholesterolemia 12/28/2010  . Palpitations 12/28/2010    Past Surgical History:  Procedure Laterality Date  . APPENDECTOMY    . BREAST LUMPECTOMY Right 2000  . COLONOSCOPY     3X  . FOOT SURGERY     RIGHT  . SEGMENTECOMY Right 12/07/2018   Procedure: RIGHT LOWER LOBE SUPERIOR SEGMENTECTOMY;  Surgeon: Melrose Nakayama, MD;  Location[facility];  Service: Thoracic;  Laterality: Right;  . TONSILLECTOMY    . TOTAL ABDOMINAL HYSTERECTOMY W/ BILATERAL SALPINGOOPHORECTOMY    . VIDEO ASSISTED THORACOSCOPY Right 12/07/2018   Procedure: VIDEO ASSISTED THORACOSCOPY;  Surgeon: Melrose Nakayama, MD;  Location: [facility];  Service: Thoracic;  Laterality: Right;  OB History   No obstetric history on file.     Family History  Problem Relation Age of Onset  . Thrombosis Father        coronary thrombosis  . Hypertension Father   . Heart failure Mother   . Coronary artery disease Brother   . Breast cancer Maternal Aunt        dx in her 38s  . Stroke Maternal Uncle   . Stroke Maternal  Grandfather     Social History   Tobacco Use  . Smoking status: Former Smoker    Types: Cigarettes    Quit date: 05/13/1962    Years since quitting: 57.6  . Smokeless tobacco: Never Used  Vaping Use  . Vaping Use: Never used  Substance Use Topics  . Alcohol use: Yes    Comment: occasionally  . Drug use: No    Home Medications Prior to Admission medications   Medication Sig Start Date End Date Taking? Authorizing Provider  amLODipine (NORVASC) 5 MG tablet TAKE 1 TABLET BY MOUTH EVERY DAY 12/30/19   Heath Lark, MD  atenolol (TENORMIN) 25 MG tablet Take 25 mg by mouth 2 (two) times daily.  01/08/11   Darlin Coco, MD  cephALEXin (KEFLEX) 500 MG capsule Take 1 capsule (500 mg total) by mouth 2 (two) times daily. 12/28/19   Heath Lark, MD  furosemide (LASIX) 20 MG tablet Take 1 tablet (20 mg total) by mouth daily. 01/02/20   Virgel Manifold, MD  HYDROcodone-acetaminophen (NORCO/VICODIN) 5-325 MG tablet Take 1 tablet by mouth every 4 (four) hours as needed. 11/17/19   Daleen Bo, MD  lenvatinib 10 mg daily dose (LENVIMA, 10 MG DAILY DOSE,) capsule Take 1 capsule every other day by mouth 12/28/19   Heath Lark, MD  lidocaine-prilocaine (EMLA) cream Apply to [facility] cath 1-2 hours prior to access as directed 09/12/15   Gordy Levan, MD  LORazepam (ATIVAN) 0.5 MG tablet 1 tablet po 30 minutes prior to radiation or MRI 10/08/16   Hayden Pedro, PA-C  memantine (NAMENDA) 5 MG tablet TAKE 1 TABLET DAILY FOR ONE WEEK, THEN TAKE 1 TABLET TWICE DAILY FOR ONE WEEK, THEN TAKE 1 TABLET IN THE MORNING AND 2 IN THE EVENING FOR ONE WEEK, THEN TAKE 2 TABLETS TWICE DAILY 12/27/19   Kathrynn Ducking, MD  mirtazapine (REMERON) 7.5 MG tablet TAKE 1 TABLET (7.5 MG TOTAL) BY MOUTH AT BEDTIME. 11/30/19   Heath Lark, MD  Multiple Vitamin (MULTIVITAMIN WITH MINERALS) TABS tablet Take 1 tablet by mouth daily.    [provider]  potassium chloride (KLOR-CON) 10 MEQ tablet Take 1 tablet (10 mEq  total) by mouth daily. 01/02/20   Virgel Manifold, MD  rivaroxaban (XARELTO) 10 MG TABS tablet Take 1 tablet (10 mg total) by mouth daily. 11/08/19   Heath Lark, MD  simvastatin (ZOCOR) 20 MG tablet Take 20 mg by mouth every evening.     [provider]    Allergies    Ace inhibitors, Dilaudid [hydromorphone hcl], and Codeine  Review of Systems   Review of Systems All systems reviewed and negative, other than as noted in HPI.  Physical Exam Updated Vital Signs BP 92/70 (BP Location: Right Arm)   Pulse 61   Temp 98.4 F (36.9 C) (Oral)   Resp 16   Ht 4\' 11"  (1.499 m)   Wt 48.1 kg   SpO2 99%   BMI 21.41 kg/m   Physical Exam Vitals and nursing note reviewed.  Constitutional:  General: She is not in acute distress.    Appearance: She is well-developed.  HENT:     Head: Normocephalic and atraumatic.  Eyes:     General:        Right eye: No discharge.        Left eye: No discharge.     Conjunctiva/sclera: Conjunctivae normal.  Cardiovascular:     Rate and Rhythm: Normal rate and regular rhythm.     Heart sounds: Normal heart sounds. No murmur heard.  No friction rub. No gallop.   Pulmonary:     Effort: Pulmonary effort is normal. No respiratory distress.     Breath sounds: Normal breath sounds.  Abdominal:     General: There is no distension.     Palpations: Abdomen is soft.     Tenderness: There is no abdominal tenderness.  Musculoskeletal:        General: No tenderness.     Cervical back: Neck supple.     Comments: Moderate symmetric pitting lower extremity edema.  No calf tenderness.  Negative Homans.  Skin:    General: Skin is warm and dry.  Neurological:     Mental Status: She is alert.  Psychiatric:        Behavior: Behavior normal.        Thought Content: Thought content normal.     ED Results / Procedures / Treatments   Labs (all labs ordered are listed, but only abnormal results are displayed) Labs Reviewed - No data to  display  EKG None  Radiology No results found.  Procedures Procedures (including critical care time)  Medications Ordered in ED Medications - No data to display  ED Course  I have reviewed the triage vital signs and the nursing notes.  Pertinent labs & imaging results that were available during my care of the patient were reviewed by me and considered in my medical decision making (see chart for details).    MDM Rules/Calculators/A&P                          79 year old female with peripheral edema.  No acute respiratory symptoms.  Renal function has historically been fine.  Recent labs 1 week ago.  Little utility in repeating at this juncture.  No improvement despite stopping prednisone.  Will place on a diuretic.  She is additionally add on amlodipine.  May need to consider discontinuing this if symptoms do not improve.   Final Clinical Impression(s) / ED  Diagnoses Final diagnoses:  Peripheral edema    Rx / DC Orders ED Discharge Orders         Ordered    furosemide (LASIX) 20 MG tablet  Daily        01/02/20 1412    potassium chloride (KLOR-CON) 10 MEQ tablet  Daily        01/02/20 1412           Virgel Manifold, MD 01/05/20 1040

## 2020-01-06 ENCOUNTER — Telehealth: Payer: Self-pay | Admitting: *Deleted

## 2020-01-06 NOTE — Telephone Encounter (Signed)
Received vm call from pt reporting that her swelling is getting progressively worse.  She reports that her calves are huge & making it difficult to walk.  She states she has no idea why.  Discussed elevation & increased oral fluids which she says she is doing.  She feels that this is disabling. She reports that she doesn't feel bad & denies any other symptoms.  Reported to Dr Alvy Bimler. Called [facility] to see when pt will be seen.  Pt was seen by [facility] RN yest & documented wound & notes they are having hard time understanding.

## 2020-01-07 ENCOUNTER — Telehealth: Payer: Self-pay | Admitting: Hematology and Oncology

## 2020-01-07 ENCOUNTER — Telehealth: Payer: Self-pay

## 2020-01-07 DIAGNOSIS — D630 Anemia in neoplastic disease: Secondary | ICD-10-CM | POA: Diagnosis not present

## 2020-01-07 DIAGNOSIS — C541 Malignant neoplasm of endometrium: Secondary | ICD-10-CM | POA: Diagnosis not present

## 2020-01-07 DIAGNOSIS — C3431 Malignant neoplasm of lower lobe, right bronchus or lung: Secondary | ICD-10-CM | POA: Diagnosis not present

## 2020-01-07 DIAGNOSIS — E039 Hypothyroidism, unspecified: Secondary | ICD-10-CM | POA: Diagnosis not present

## 2020-01-07 DIAGNOSIS — S81802A Unspecified open wound, left lower leg, initial encounter: Secondary | ICD-10-CM | POA: Diagnosis not present

## 2020-01-07 DIAGNOSIS — C55 Malignant neoplasm of uterus, part unspecified: Secondary | ICD-10-CM | POA: Diagnosis not present

## 2020-01-07 DIAGNOSIS — I82401 Acute embolism and thrombosis of unspecified deep veins of right lower extremity: Secondary | ICD-10-CM | POA: Diagnosis not present

## 2020-01-07 DIAGNOSIS — I129 Hypertensive chronic kidney disease with stage 1 through stage 4 chronic kidney disease, or unspecified chronic kidney disease: Secondary | ICD-10-CM | POA: Diagnosis not present

## 2020-01-07 DIAGNOSIS — E46 Unspecified protein-calorie malnutrition: Secondary | ICD-10-CM | POA: Diagnosis not present

## 2020-01-07 DIAGNOSIS — R413 Other amnesia: Secondary | ICD-10-CM | POA: Diagnosis not present

## 2020-01-07 DIAGNOSIS — R600 Localized edema: Secondary | ICD-10-CM | POA: Diagnosis not present

## 2020-01-07 DIAGNOSIS — D6481 Anemia due to antineoplastic chemotherapy: Secondary | ICD-10-CM | POA: Diagnosis not present

## 2020-01-07 DIAGNOSIS — S81802D Unspecified open wound, left lower leg, subsequent encounter: Secondary | ICD-10-CM | POA: Diagnosis not present

## 2020-01-07 DIAGNOSIS — L03116 Cellulitis of left lower limb: Secondary | ICD-10-CM | POA: Diagnosis not present

## 2020-01-07 DIAGNOSIS — N1831 Chronic kidney disease, stage 3a: Secondary | ICD-10-CM | POA: Diagnosis not present

## 2020-01-07 DIAGNOSIS — C7951 Secondary malignant neoplasm of bone: Secondary | ICD-10-CM | POA: Diagnosis not present

## 2020-01-07 NOTE — Telephone Encounter (Signed)
Scheduled per los. Called and spoke with patient. Confirmed appt 

## 2020-01-07 NOTE — Telephone Encounter (Signed)
Received TC from [place] PT with [place] request PT.  Received verbal ok from Dr. Alvy Bimler to begin PT.

## 2020-01-11 ENCOUNTER — Other Ambulatory Visit: Payer: Self-pay

## 2020-01-11 ENCOUNTER — Inpatient Hospital Stay: Payer: Medicare Other

## 2020-01-11 ENCOUNTER — Inpatient Hospital Stay (HOSPITAL_BASED_OUTPATIENT_CLINIC_OR_DEPARTMENT_OTHER): Payer: Medicare Other | Admitting: Hematology and Oncology

## 2020-01-11 VITALS — BP 146/56 | HR 74 | Temp 96.7°F | Resp 16 | Ht 59.0 in | Wt 107.6 lb

## 2020-01-11 DIAGNOSIS — C3491 Malignant neoplasm of unspecified part of right bronchus or lung: Secondary | ICD-10-CM

## 2020-01-11 DIAGNOSIS — L97921 Non-pressure chronic ulcer of unspecified part of left lower leg limited to breakdown of skin: Secondary | ICD-10-CM | POA: Diagnosis not present

## 2020-01-11 DIAGNOSIS — D61818 Other pancytopenia: Secondary | ICD-10-CM

## 2020-01-11 DIAGNOSIS — R64 Cachexia: Secondary | ICD-10-CM | POA: Diagnosis not present

## 2020-01-11 DIAGNOSIS — D638 Anemia in other chronic diseases classified elsewhere: Secondary | ICD-10-CM

## 2020-01-11 DIAGNOSIS — Z7189 Other specified counseling: Secondary | ICD-10-CM

## 2020-01-11 DIAGNOSIS — C7951 Secondary malignant neoplasm of bone: Secondary | ICD-10-CM

## 2020-01-11 DIAGNOSIS — Z95828 Presence of other vascular implants and grafts: Secondary | ICD-10-CM

## 2020-01-11 DIAGNOSIS — Z853 Personal history of malignant neoplasm of breast: Secondary | ICD-10-CM

## 2020-01-11 DIAGNOSIS — L03116 Cellulitis of left lower limb: Secondary | ICD-10-CM

## 2020-01-11 DIAGNOSIS — C779 Secondary and unspecified malignant neoplasm of lymph node, unspecified: Secondary | ICD-10-CM | POA: Diagnosis not present

## 2020-01-11 DIAGNOSIS — E039 Hypothyroidism, unspecified: Secondary | ICD-10-CM

## 2020-01-11 DIAGNOSIS — C3411 Malignant neoplasm of upper lobe, right bronchus or lung: Secondary | ICD-10-CM | POA: Diagnosis not present

## 2020-01-11 DIAGNOSIS — C541 Malignant neoplasm of endometrium: Secondary | ICD-10-CM

## 2020-01-11 DIAGNOSIS — D701 Agranulocytosis secondary to cancer chemotherapy: Secondary | ICD-10-CM

## 2020-01-11 DIAGNOSIS — C549 Malignant neoplasm of corpus uteri, unspecified: Secondary | ICD-10-CM

## 2020-01-11 DIAGNOSIS — D649 Anemia, unspecified: Secondary | ICD-10-CM | POA: Diagnosis not present

## 2020-01-11 LAB — CBC WITH DIFFERENTIAL (CANCER CENTER ONLY)
Abs Immature Granulocytes: 0.12 10*3/uL — ABNORMAL HIGH (ref 0.00–0.07)
Basophils Absolute: 0.1 10*3/uL (ref 0.0–0.1)
Basophils Relative: 1 %
Eosinophils Absolute: 0.1 10*3/uL (ref 0.0–0.5)
Eosinophils Relative: 2 %
HCT: 25.9 % — ABNORMAL LOW (ref 36.0–46.0)
Hemoglobin: 7.8 g/dL — ABNORMAL LOW (ref 12.0–15.0)
Immature Granulocytes: 2 %
Lymphocytes Relative: 18 %
Lymphs Abs: 1.4 10*3/uL (ref 0.7–4.0)
MCH: 29 pg (ref 26.0–34.0)
MCHC: 30.1 g/dL (ref 30.0–36.0)
MCV: 96.3 fL (ref 80.0–100.0)
Monocytes Absolute: 1.1 10*3/uL — ABNORMAL HIGH (ref 0.1–1.0)
Monocytes Relative: 14 %
Neutro Abs: 4.9 10*3/uL (ref 1.7–7.7)
Neutrophils Relative %: 63 %
Platelet Count: 235 10*3/uL (ref 150–400)
RBC: 2.69 MIL/uL — ABNORMAL LOW (ref 3.87–5.11)
RDW: 16 % — ABNORMAL HIGH (ref 11.5–15.5)
WBC Count: 7.6 10*3/uL (ref 4.0–10.5)
nRBC: 0 % (ref 0.0–0.2)

## 2020-01-11 LAB — CMP (CANCER CENTER ONLY)
ALT: 24 U/L (ref 0–44)
AST: 28 U/L (ref 15–41)
Albumin: 3.2 g/dL — ABNORMAL LOW (ref 3.5–5.0)
Alkaline Phosphatase: 61 U/L (ref 38–126)
Anion gap: 5 (ref 5–15)
BUN: 31 mg/dL — ABNORMAL HIGH (ref 8–23)
CO2: 25 mmol/L (ref 22–32)
Calcium: 10 mg/dL (ref 8.9–10.3)
Chloride: 110 mmol/L (ref 98–111)
Creatinine: 0.8 mg/dL (ref 0.44–1.00)
GFR, Est AFR Am: >60 mL/min (ref >60)
GFR, Estimated: >60 mL/min (ref >60)
Glucose, Bld: 86 mg/dL (ref 70–99)
Potassium: 4.3 mmol/L (ref 3.5–5.1)
Sodium: 140 mmol/L (ref 135–145)
Total Bilirubin: 0.4 mg/dL (ref 0.3–1.2)
Total Protein: 6.3 g/dL — ABNORMAL LOW (ref 6.5–8.1)

## 2020-01-11 LAB — PREPARE RBC (CROSSMATCH)

## 2020-01-11 LAB — TSH: TSH: 1.626 u[IU]/mL (ref 0.308–3.960)

## 2020-01-11 LAB — T4, FREE: Free T4: 1.54 ng/dL — ABNORMAL HIGH (ref 0.61–1.12)

## 2020-01-11 LAB — SAMPLE TO BLOOD BANK

## 2020-01-11 LAB — TOTAL PROTEIN, URINE DIPSTICK: Protein, ur: NEGATIVE mg/dL

## 2020-01-11 MED ORDER — SODIUM CHLORIDE 0.9% FLUSH
10.0000 mL | INTRAVENOUS | Status: AC | PRN
  Administered 2020-01-11: 10 mL
  Filled 2020-01-11: qty 10

## 2020-01-11 MED ORDER — SODIUM CHLORIDE 0.9% IV SOLUTION
250.0000 mL | Freq: Once | INTRAVENOUS | Status: AC
  Administered 2020-01-11: 250 mL via INTRAVENOUS
  Filled 2020-01-11: qty 250

## 2020-01-11 MED ORDER — HEPARIN SOD (PORK) LOCK FLUSH 100 UNIT/ML IV SOLN
500.0000 [IU] | Freq: Every day | INTRAVENOUS | Status: AC | PRN
  Administered 2020-01-11: 500 [IU]
  Filled 2020-01-11: qty 5

## 2020-01-11 MED ORDER — SODIUM CHLORIDE 0.9% FLUSH
10.0000 mL | Freq: Once | INTRAVENOUS | Status: AC
  Administered 2020-01-11: 10 mL
  Filled 2020-01-11: qty 10

## 2020-01-11 MED ORDER — ACETAMINOPHEN 325 MG PO TABS
650.0000 mg | ORAL_TABLET | Freq: Once | ORAL | Status: AC
  Administered 2020-01-11: 650 mg via ORAL

## 2020-01-11 MED ORDER — ACETAMINOPHEN 325 MG PO TABS
ORAL_TABLET | ORAL | Status: AC
  Filled 2020-01-11: qty 2

## 2020-01-11 NOTE — Patient Instructions (Signed)

## 2020-01-12 ENCOUNTER — Encounter: Payer: Self-pay | Admitting: Hematology and Oncology

## 2020-01-12 DIAGNOSIS — L03116 Cellulitis of left lower limb: Secondary | ICD-10-CM | POA: Diagnosis not present

## 2020-01-12 DIAGNOSIS — C541 Malignant neoplasm of endometrium: Secondary | ICD-10-CM | POA: Diagnosis not present

## 2020-01-12 DIAGNOSIS — S81802D Unspecified open wound, left lower leg, subsequent encounter: Secondary | ICD-10-CM | POA: Diagnosis not present

## 2020-01-12 DIAGNOSIS — L97909 Non-pressure chronic ulcer of unspecified part of unspecified lower leg with unspecified severity: Secondary | ICD-10-CM | POA: Insufficient documentation

## 2020-01-12 DIAGNOSIS — D630 Anemia in neoplastic disease: Secondary | ICD-10-CM | POA: Diagnosis not present

## 2020-01-12 DIAGNOSIS — C3431 Malignant neoplasm of lower lobe, right bronchus or lung: Secondary | ICD-10-CM | POA: Diagnosis not present

## 2020-01-12 DIAGNOSIS — C7951 Secondary malignant neoplasm of bone: Secondary | ICD-10-CM | POA: Diagnosis not present

## 2020-01-12 LAB — TYPE AND SCREEN
ABO/RH(D): A POS
Antibody Screen: NEGATIVE
Unit division: 0

## 2020-01-12 LAB — BPAM RBC
Blood Product Expiration Date: 202109192359
ISSUE DATE / TIME: 202108311426
Unit Type and Rh: 6200

## 2020-01-12 NOTE — Assessment & Plan Note (Signed)
This is likely anemia of chronic disease. The patient denies recent history of bleeding such as epistaxis, hematuria or hematochezia.  We discussed some of the risks, benefits, and alternatives of blood transfusions. The patient is symptomatic from anemia and the hemoglobin level is critically low.  Some of the side-effects to be expected including risks of transfusion reactions, chills, infection, syndrome of volume overload and risk of hospitalization from various reasons and the patient is willing to proceed and went ahead to sign consent today. She will receive a unit of blood

## 2020-01-12 NOTE — Progress Notes (Signed)
[place] OFFICE PROGRESS NOTE  Patient Care Team: Shon Baton, MD as PCP - General (Internal Medicine) Heath Lark, MD as Consulting Physician (Hematology and Oncology)  ASSESSMENT & PLAN:  Endometrial cancer [facility]) She is doing better from wound care perspective She felt that referral with advanced home care was helpful We will continue supportive care For now, she is not a candidate for chemotherapy due to increased risk of infection and worsening delay wound healing I will see her again in a few weeks for further follow-up and transfusion support   Anemia, chronic disease This is likely anemia of chronic disease. The patient denies recent history of bleeding such as epistaxis, hematuria or hematochezia.  We discussed some of the risks, benefits, and alternatives of blood transfusions. The patient is symptomatic from anemia and the hemoglobin level is critically low.  Some of the side-effects to be expected including risks of transfusion reactions, chills, infection, syndrome of volume overload and risk of hospitalization from various reasons and the patient is willing to proceed and went ahead to sign consent today. She will receive a unit of blood  Leg ulcer ([place]) She has persistent leg ulcer She saw her primary care doctor recently who applied a special dressing and she is getting advanced home care and nursing staff to check on her wound She has appointment to go to wound care clinic next month She is instructed to stop antibiotics   Orders Placed This Encounter  Procedures  . Informed Consent Details: Physician/Practitioner Attestation; Transcribe to consent form and obtain patient signature    Standing Status:   Future    Number of Occurrences:   1    Standing Expiration Date:   01/10/2021    Order Specific Question:   Physician/Practitioner attestation of informed consent for blood and or blood product transfusion    Answer:   I, the physician/practitioner,  attest that I have discussed with the patient the benefits, risks, side effects, alternatives, likelihood of achieving goals and potential problems during recovery for the procedure that I have provided informed consent.    Order Specific Question:   Product(s)    Answer:   All Product(s)  . Care order/instruction    Transfuse Parameters    Standing Status:   Future    Number of Occurrences:   1    Standing Expiration Date:   01/10/2021  . Type and screen    Standing Status:   Future    Standing Expiration Date:   01/10/2021  . Prepare RBC (crossmatch)    Standing Status:   Standing    Number of Occurrences:   1    Order Specific Question:   # of Units    Answer:   1 unit    Order Specific Question:   Transfusion Indications    Answer:   Symptomatic Anemia    Order Specific Question:   Number of Units to Keep Ahead    Answer:   NO units ahead    Order Specific Question:   If emergent release call blood bank    Answer:   Not emergent release    All questions were answered. The patient knows to call the clinic with any problems, questions or concerns. The total time spent in the appointment was 20 minutes encounter with patients including review of chart and various tests results, discussions about plan of care and coordination of care plan   Heath Lark, MD 01/12/2020 9:26 AM  INTERVAL HISTORY: Please see  below for problem oriented charting. She returns with her husband for further follow-up She is doing better She is eating well and gaining weight She is wearing elastic compression hose to help with her leg swelling She saw her primary care doctor recently who applied a special dressing to her wound She felt that the advanced home care nursing staff was helpful to monitor her wound She is also getting physical therapy at home The patient denies excessive fatigue or dizziness but according to her husband, he noted that she has been complaining of dizziness She denies chest pain or  shortness of breath The patient denies any recent signs or symptoms of bleeding such as spontaneous epistaxis, hematuria or hematochezia.  SUMMARY OF ONCOLOGIC HISTORY: Oncology History Overview Note  Biopsy of recurrence 06-2014 ER negative. PDL1 testing low at 2% on the uterine cancer She progressed on carboplatin and Paclitaxel for uterine cancer HER 2 negative on pathology [id number] Negative genetic testing  On her lung cancer sample, [employer] [facility] testing revealed 10% PD-1 positive EGFR mutation was detected, exon 19 deletion   Endometrial cancer ([place])  12/01/2007 Initial Diagnosis   She has recurrent papillary serous endometrial carcinoma. Briefly she was diagnosed in 2009 with a FIGO IB pappilary serous carcinoma treated with hysterectomy followed by adjuvant chemo and vaginal brachytherapy. She then had a recurrence diagnosed in late 2012 that was deemed not to be surgically resectable, and was treated with 6 cycles of carbo/taxol followed by 5040 cGy of EBRT to the pelvic mass. She was then followed with serial imaging and was noted in June of this year to have slight increase in the size of the mass, and again in September there was small enlargement of the mass. She had a re-staging PET scan on 03/10/13 that showed FDG activity in the pelvic mass, with no other areas of disease   12/01/2007 Imaging   CT scan of chest, abdomen and pelvis: 1.  Mass like area of decreased attenuation in the central uterus, consistent with the known endometrial carcinoma.  Deep myometrial invasion is suspected.  Pelvic MRI without and with contrast could be performed for further staging workup if clinically warranted. 2.  No evidence of extrauterine extension or lymphadenopathy. 3.  Sigmoid diverticulosis incidentally noted.   12/15/2007 Surgery   --12/2007 laparoscopic hysterectomy and PLND --05/2008 complted 6 cycles adjuvant carbo/taxol followed by vaginal cuff brachy --03/2011 exploratory lararoscopy,  ureterolysis --4/13 completed 6 cycels carbotaxol --8/13 completed EBRT to the left pelvic sidewall    06/10/2008 Imaging   CT scan abdomen and pelvis 1.  Nonspecific mildly prominent inguinal lymph nodes, right greater than left. 2.  Interval hysterectomy without evidence of recurrent pelvic mass or fluid collection.   03/22/2011 Imaging   Ct scan abdomen and pelvis 1.  Local uterine cancer recurrence with two large nodes along the left pelvic sidewall. 2.  No evidence of bowel obstruction, urinary obstruction, or more distant metastasis.   03/31/2011 Relapse/Recurrence   chemotherapy and external beam radiation   05/16/2011 - 09/17/2011 Chemotherapy   She had 6 cycles of carboplatin and Taxol    07/17/2011 Imaging   Ct scan abdomen and pelvis 1.  Interval improvement in the previously demonstrated left pelvic local recurrence of tumor. 2.  No disease progression or complication identified. 3.  Mild bladder wall thickening on the right, nonspecific and possibly related to incomplete distension and/or radiation therapy. 4.  Cholelithiasis   10/11/2011 Imaging   CT scan abdomen and pelvis 1.  Interval  enlargement of the left pelvic sidewall masses. 2.  No new nodular disease in the pelvis or lymphadenopathy. 3.  No evidence  of distant metastasis. 4.  Mild hydronephrosis on the right is similar to prior. 5.  Focal thickening of the right aspect the bladder is stable compared to prior.    10/28/2011 - 12/05/2011 Radiation Therapy   radiation for pelvic sidewall recurrence   10/28/2011 - 12/05/2011 Radiation Therapy   10/28/11-12/05/11: Radiotherapy to the left pelvic sidewall   03/18/2012 Imaging   CT abdomen 1.  Today's study demonstrates a positive response to therapy with decreased size of left pelvic sidewall nodal masses, as detailed above.  No new soft tissue masses or new lymphadenopathy is identified within the abdomen or pelvis. Continue attention on follow-up studies is  recommended. 2.  Cholelithiasis without findings to suggest acute cholecystitis. 3.  Colonic diverticulosis without findings to suggest acute diverticulitis at this time. 4.  Mild asymmetric urinary bladder wall thickening is unchanged compared to prior examinations and is nonspecific.  No definite bladder wall mass is identified at this time. 5.  Additional findings, similar to prior examinations, as above   06/22/2012 Imaging   CT abdomen 1.  Further decreased size of the left pelvic peripherally enhancing lesion. 2.  No new sites of new or progressive disease. 3. Esophageal air fluid level suggests dysmotility or gastroesophageal reflux. 4.  Cholelithiasis   11/02/2012 Imaging   CT abdomen 1.  The left pelvic side wall lesion demonstrates mild increase in size from previous exam. 2.  No new sites of new or progressive disease. 3.  Cholelithiasis.    01/28/2013 Imaging   CT abdomen 1. Interval small increase in volume of enhancing mass adjacent to the left pelvic sidewall. 2. No evidence of abdominal or pelvic lymphadenopathy   04/02/2013 Surgery   pelvic mass resection with IORT   04/02/2013 - 04/02/2013 Radiation Therapy   04/02/13: Intraoperative radiotherapy to the left pelvis   05/10/2013 PET scan   1. Hypermetabolic mass in the deep left pelvis consistent with metastasis. 2. No additional evidence of local metastasis. No evidence of distant metastasis. 3. Small right lower lobe pulmonary nodule is not hypermetabolic.   07/27/2013 Imaging   No evidence of recurrent or metastatic disease. Apparent prior resection of the deep left pelvic metastasis.   02/08/2014 Imaging   No CT findings for recurrent or metastatic disease involving the abdomen/ pelvis   06/13/2014 Relapse/Recurrence   + recurrence paraspinous muscle   06/21/2014 Procedure   There is a soft tissue lesion along the left side of L3-L4. This lesion roughly measures up to 2.2 cm. Needle was positioned along the  posterior aspect of the lesion. Small amount of air in the paraspinal tissues following needle removal   06/21/2014 Pathology Results   Bone, biopsy, left lumbar paraspinal - METASTATIC POORLY DIFFERENTIATED CARCINOMA, CONSISTENT WITH HIGH GRADE SEROUS CARCINOMA SEE COMMENT. Microscopic Comment The carcinoma demonstrates the following immunophenotype: Cytokeratin 7 - patchy moderate to strong expression Estrogen receptor - negative expression P53 - strong diffuse expression TTF-1 - negative expression WT-1 - negative expression CD56 - focal moderate strong expression Synaptophysin - negative expression GCDFP - negative expression The history of primary endometrial papillary serous carcinoma and primary mammary carcinoma is noted. In the current case, the overall morphologic and immunophenotype are that of poorly differentiated carcinoma, consistent with high grade serous carcinoma.    07/11/2014 - 08/12/2014 Radiation Therapy   07/11/14-08/12/14: 55 Gy in 25 fractions to the  Lumbar spine   08/15/2014 Imaging   CT scan of chest, abdomen and pelvis 1. Since the biopsy study of 06/21/2014, similar to slight decrease in size of a left paraspinous lesion at L3-4. 2. No new sites of metastatic disease identified. 3. 8 mm ground-glass nodule in the right lower lobe is grossly similar to 03/10/2013, suggesting a benign etiology. 4. Cholelithiasis. 5. Apparent sigmoid colonic wall thickening which could be due to underdistention. Colitis felt less likely. 6.  Atherosclerosis, including within the coronary arteries. 7. Pelvic floor laxity.   10/07/2014 Imaging   CT scan of chest, abdomen and pelvis: Stable to slight interval decrease in size of left paraspinous lesion at L3-4. No new sites of metastatic disease. Unchanged ground-glass nodule within the right lower lobe, potentially benign in etiology. Recommend attention on followup. Cholelithiasis. Sigmoid colonic diverticulosis. No CT evidence to  suggest acute diverticulitis.   01/19/2015 Imaging   Ct scan of abdomen and pelvis 1. Decrease in size of left paraspinous metastasis. 2. No new sites of disease. 3. Stable ground-glass attenuating nodule within the superior segment of right lower lobe. 4. Gallstones   05/02/2015 Imaging   Ct abdomen and pelvis: Slight interval increase in size of left lumbar paraspinal soft tissue metastasis. No new sites of metastatic disease identified within the abdomen or pelvis.    05/11/2015 PET scan   1. The small soft tissue mass just lateral to the left L3 for neural foramen is hypermetabolic, favoring malignancy. 2. There is also a small focus of hypermetabolic activity between the left L1 and L2 transverse processes, but without a CT correlate. 3. The 13 mm sub solid nodule in the superior segment right lower lobe is stable from recent exams but has slowly increased in size over the last 7 years. Although not hypermetabolic, the appearance is concerning for the possibility of a low grade adenocarcinoma. 4. Other imaging findings of potential clinical significance: Chronic bilateral maxillary sinusitis. Coronary, aortic arch, and branch vessel atherosclerotic vascular disease. Aortoiliac atherosclerotic vascular disease. Cholelithiasis. Sigmoid colon diverticulosis. Lumbar scoliosis.   07/11/2015 Imaging   Ct scan of chest, abdomen and pelvis: 1. 13 mm mixed solid and sub solid nodule in the posterior right lower lobe was present in 2009 and has clearly progressed in the interval since that study. Imaging features remain highly concerning for low-grade or well differentiated adenocarcinoma. 2. Slight increase size of in the hypermetabolic, rim enhancing nodule identified adjacent to the left L3-4 neural foramen. Metastatic disease remains a concern. 3. No evidence for discrete soft tissue lesion between the left L1 and 2 transverse processes, the site of focal FDG uptake on the recent PET-CT. 4.  Cholelithiasis. 5. Abdominal aortic atherosclerosis   08/03/2015 - 01/16/2016 Chemotherapy   She received carboplatin and Taxol   11/06/2015 PET scan   1. Hypermetabolic left J8-2 paraspinous metastasis (biopsy-proven) is stable in size and slightly decreased in metabolism. 2. Previously described small focus of hypermetabolism between the left L2 and L3 spinous processes has resolved. 3. New mild linear hypermetabolism to the left of the T11-12 spinous processes without discrete mass on the CT images, favor benign activity related activity, recommend attention on follow-up PET-CT.  4. No definite new sites of hypermetabolic metastatic disease. No recurrent hypermetabolic metastatic disease in the pelvis. 5. Interval stability of subsolid 1.3 cm superior segment right lower lobe pulmonary nodule without associated significant metabolism, which has grown compared to the 2009 chest CT study, and remain suspicious for low grade adenocarcinoma. 6.  Additional findings include aortic atherosclerosis, coronary atherosclerosis, mild multinodular goiter with no hypermetabolic thyroid nodules, cholelithiasis and moderate sigmoid diverticulosis.   02/12/2016 PET scan   1. Interval increase in size and metabolic activity of the LEFT paraspinal soft tissue metastasis. 2. New activity within the musculature of the LEFT chest wall. Given the unusual location of the paraspinal metastasis cannot exclude a second soft tissue metastasis to the musculature however favor benign physiologic activity. 3. Stable RIGHT lower lobe pulmonary nodule. 4. No evidence of local recurrence.     04/08/2016 Imaging   Ct scan of chest, abdomen and pelvis: 1. Similar size of a  left paravertebral abdominal lesion. 2. No new or progressive metastatic disease. 3. No correlate for the muscular activity about the lateral left chest wall. 4.  Coronary artery atherosclerosis. Aortic atherosclerosis. 5. Cholelithiasis. 6. Similar  right lower lobe pulmonary nodule.   06/03/2016 Imaging   CT abdomen and pelvis 1. Similar to mild enlargement of a paravertebral soft tissue lesion at the L3-4 level. 2. No new sites of disease identified. 3. Cholelithiasis. 4. Hysterectomy. 5.  Aortic atherosclerosis.    09/02/2016 Imaging   CT abdomen and pelvis 1. Continue mild interval increase in size of LEFT paraspinal mass (1-2 mm). 2. No evidence of new metastatic disease in the abdomen pelvis. 3. Post hysterectomy anatomy   09/26/2016 Imaging   MR lumbar spine 1. Left paraspinal metastasis with epicenter adjacent to the left L3 neural foramen does extend into the foramen to the level of the left lateral epidural space (series 9, image 31), but also tracks cephalad and caudal along the L2-L4 lumbar plexus (series 10, image 15). No extension into the left L2 or L4 neural foramina. And no more distal extension of tumor. 2. Abnormal signal in the medial left psoas and ventral left erector spinae muscles at L2 and L3 is probably denervation related. 3. No bone invasion or osseous metastatic disease. Suspect previous radiation of the L2 through L5 spinal levels. 4. No dural or intradural metastatic disease.    10/22/2016 Procedure   She underwent stereotactic Radiosurgery   10/30/2016 Genetic Testing   Patient has genetic testing done for Inheritable genetic mutation panel. Results revealed patient has no actionable mutation.   01/21/2017 Imaging   1. Reduced size and conspicuity of the left paraspinal mass at the L3-4 level. The mass still present but has enhancement similar to that of adjacent psoas musculature. 2. No new metastatic disease is identified. 3. Other imaging findings of potential clinical significance: Cholelithiasis. Aortic Atherosclerosis (ICD10-I70.0). Sigmoid diverticulosis. Lumbar scoliosis.   05/09/2017 Imaging   Ct scan of abdomen and pelvis 1. Paraspinal mass adjacent to the left side of L3-L4 is less  distinct than prior examinations, and is therefore difficult to discretely measure, however, the overall appearance suggests continued positive response to therapy. 2. No new signs of metastatic disease elsewhere in the abdomen or pelvis. 3. Aortic atherosclerosis, in addition to at least right coronary artery disease. Assessment for potential risk factor modification, dietary therapy or pharmacologic therapy may be warranted, if clinically indicated. 4. Colonic diverticulosis without evidence of acute diverticulitis at this time. 5. Additional incidental findings, as above. Aortic Atherosclerosis (ICD10-I70.0).   08/14/2017 Imaging   1. Treated left paraspinal mass centered at L3-4. Mass size is stable but there has been some evolution of tumor characteristics with less central fluid seen today. Recommend continued surveillance. 2. No evidence of untreated metastasis. 3. Degenerative changes and related impingement are described above.  10/28/2017 Imaging   Stable small left paraspinal soft tissue mass. No new or progressive disease within the abdomen or pelvis.  Cholelithiasis.  No radiographic evidence of cholecystitis.  Colonic diverticulosis, without radiographic evidence of diverticulitis.   04/30/2018 Imaging   Stable post treatment change of the partially necrotic LEFT paravertebral mass at L3-L4. 18 x 21 mm cross-section. Mass effect on the LEFT L3 nerve root redemonstrated. Enhancing and atrophic LEFT psoas is inseparable.   10/15/2018 Imaging   1. Stable post treatment size and appearance of partially necrotic left paravertebral mass at L3-4, measuring 19 x 25 mm on current exam (unchanged when measured at similar level on previous study). Mass effect on the adjacent left L3 nerve root is unchanged. Adjacent enhancing and atrophic left psoas muscle. 2. Left convex scoliosis with associated multilevel degenerative spondylolysis and facet arthrosis, unchanged   10/29/2018 Imaging    Stable small left L3 paraspinal soft tissue mass. No evidence of new or progressive metastatic disease, or other acute findings.   Cholelithiasis.  No radiographic evidence of cholecystitis.   Colonic diverticulosis, without radiographic evidence of diverticulitis.   11/11/2018 PET scan   1. Low level metabolism (max SUV 2.0) associated with the irregular subsolid superior segment right lower lobe 2.1 cm pulmonary nodule. As better depicted on the recent diagnostic chest CT study, this nodule crosses the major fissure into the right upper lobe and has increased in size and density on multiple imaging studies back to 2009. Findings are most suggestive of primary bronchogenic adenocarcinoma. 2. No hypermetabolic thoracic adenopathy. 3. Persistent hypermetabolism (max SUV 8.0) associated with the known left L3-4 paraspinous metastasis, mildly decreased in metabolism since 02/12/2016 PET-CT. 4. No additional sites of hypermetabolic metastatic disease in the abdomen or pelvis. No metabolic evidence of peritoneal recurrence. No ascites. 5. Chronic findings include: Aortic Atherosclerosis (ICD10-I70.0). Coronary atherosclerosis. Chronic bilateral maxillary sinusitis. Cholelithiasis. Moderate sigmoid diverticulosis.   01/07/2019 - 04/04/2019 Chemotherapy   The patient had carboplatin and Alimta for chemotherapy treatment.     05/03/2019 -  Chemotherapy   The patient had Pembrolizumab and Lenvima for chemotherapy treatment.     07/23/2019 PET scan   1. 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 No new sites of hypermetabolic metastatic disease on today's study. 2. Stable appearance of surgical changes in the right lung without features to suggest local recurrence or metastatic lung cancer. 3. Cholelithiasis. 4.  Aortic Atherosclerois (ICD10-170.0)   10/20/2019 Imaging   Bilateral venous Doppler US RIGHT:  - Findings consistent with acute and  occlusive deep vein thrombosis involving the right common femoral vein, right external iliac vein, right femoral vein, right popliteal vein, right posterior tibial veins, and right peroneal veins.   Unable to adequately visualize the common iliac veins due to bowel gas. The imaged portions of the IVC were patent.     LEFT:  - No evidence of common femoral vein obstruction.    11/08/2019 PET scan   1. Further reduction in activity at the left L3 level and left paraspinal tissues at L3. Maximum vertebral SUV currently 3.9, previously 4.8 on 07/23/2019 and previously 10.0 on 04/14/2019. 2. Late phase healing of previous left transverse process fractures at L3 and L4. Suspected healing right sixth rib fracture anteriorly. 3. There is new focal activity in the vicinity of the right internal jugular vein in the lower neck. Maximum SUV in this vicinity is 5.1. I not see a well-defined lymph node are thyroid  lesion to corroborate with the focus, but surveillance in this region is suggested. 4. Other imaging findings of potential clinical significance: Aortic Atherosclerosis (ICD10-I70.0). Coronary atherosclerosis. Cholelithiasis. Sigmoid colon diverticulosis.   Metastatic cancer to bone ([place])  06/27/2014 Initial Diagnosis   Metastatic cancer to bone   Primary lung cancer, right ([place])  11/04/2018 Initial Diagnosis   Primary lung cancer, right ([place])   11/11/2018 PET scan   1. Low level metabolism (max SUV 2.0) associated with the irregular subsolid superior segment right lower lobe 2.1 cm pulmonary nodule. As better depicted on the recent diagnostic chest CT study, this nodule crosses the major fissure into the right upper lobe and has increased in size and density on multiple imaging studies back to 2009. Findings are most suggestive of primary bronchogenic adenocarcinoma. 2. No hypermetabolic thoracic adenopathy. 3. Persistent hypermetabolism (max SUV 8.0) associated with the known left L3-4 paraspinous  metastasis, mildly decreased in metabolism since 02/12/2016 PET-CT. 4. No additional sites of hypermetabolic metastatic disease in the abdomen or pelvis. No metabolic evidence of peritoneal recurrence. No ascites. 5. Chronic findings include: Aortic Atherosclerosis (ICD10-I70.0). Coronary atherosclerosis. Chronic bilateral maxillary sinusitis. Cholelithiasis. Moderate sigmoid diverticulosis.   12/07/2018 Pathology Results   1. Lung, resection (segmental or lobe), Right Lobe Superior with endblock wedge of right upper lobe - INVASIVE ADENOCARCINOMA, MODERATELY DIFFERENTIATED, SPANNING 1.6 CM. - THE SURGICAL RESECTION MARGINS ARE NEGATIVE FOR CARCINOMA. - SEE ONCOLOGY TABLE BELOW. 2. Lymph node, biopsy, Level 9 #1 - THERE IS NO EVIDENCE OF CARCINOMA IN 1 OF 1 LYMPH NODE (0/1). 3. Lymph node, biopsy, Level 9 #2 - THERE IS NO EVIDENCE OF CARCINOMA IN 1 OF 1 LYMPH NODE (0/1). 4. Lymph node, biopsy, Level 7 #1 - METASTATIC CARCINOMA IN 1 OF 1 LYMPH NODE (1/1). 5. Lymph node, biopsy, Level 7 #2 - THERE IS NO EVIDENCE OF CARCINOMA IN 1 OF 1 LYMPH NODE (0/1). 6. Lymph node, biopsy, Level 12 #1 - THERE IS NO EVIDENCE OF CARCINOMA IN 1 OF 1 LYMPH NODE (0/1). 7. Lymph node, biopsy, Level 12 #2 - THERE IS NO EVIDENCE OF CARCINOMA IN 1 OF 1 LYMPH NODE (0/1). 8. Lymph node, biopsy, Level 10 #1 - THERE IS NO EVIDENCE OF CARCINOMA IN 1 OF 1 LYMPH NODE (0/1). 9. Lymph node, biopsy, Level 4R #1 - THERE IS NO EVIDENCE OF CARCINOMA IN 1 OF 1 LYMPH NODE (0/1). 10. Lymph node, biopsy, Level 4R #2 - THERE IS NO EVIDENCE OF CARCINOMA IN 1 OF 1 LYMPH NODE (0/1). 11. Lymph node, biopsy, Level 2R #1 - THERE IS NO EVIDENCE OF CARCINOMA IN 1 OF 1 LYMPH NODE (0/1). 12. Lung, resection (segmental or lobe), Right Lobe Superior - BENIGN LUNG PARENCHYMA. - THERE IS NO EVIDENCE OF MALIGNANCY. Procedure: Segmentectomy. Specimen Laterality: Right. Tumor Site: Right superior lobe. Tumor Size: 1.6 cm (gross  measurement). Tumor Focality: Unifocal. Histologic Type: Adenocarcinoma, moderately differentiated. Visceral Pleura Invasion: Not identified. Lymphovascular Invasion: Not identified. Direct Invasion of Adjacent Structures: Not identified. Margins: Negative for carcinoma. Treatment Effect: N/A Regional Lymph Nodes: Number of Lymph Nodes Involved: 1 (level 7) Number of Lymph Nodes Examined: 10 Pathologic Stage Classification (pTNM, AJCC 8th Edition): pT1b, pN2 Ancillary Studies: The tumor cells are positive for Napsin-A, TTF-1, and p53. They are essentially negative for estrogen receptor, PAX-8 and progesterone receptor. Additional studies can be performed upon clinician request. Representative Tumor Block: 1D-1E. (JBK:gt, 12/09/18)   12/07/2018 Surgery   PREOPERATIVE DIAGNOSIS:  Right lung nodule.   POSTOPERATIVE DIAGNOSIS:  Non-small cell carcinoma- suspected primary lung carcinoma, clinical stage T2N01b.   PROCEDURE:   Right video-assisted thoracoscopy, Right lower lobe superior segmentectomy with en bloc wedge resection of right upper lobe, Lymph node dissection, Intercostal nerve block levels 3-9.   SURGEON:  Charlett Lango, MD   FINDINGS:  Mass palpable in posterior aspect of superior segment of right lower lobe, likely involvement across fissure into the upper lobe.  Frozen section revealed non-small cell carcinoma. upper and lower lobe margins clear.   12/23/2018 Cancer Staging   Staging form: Lung, AJCC 8th Edition - Pathologic: Stage IIIA (pT1b, pN2, cM0) - Signed by Artis Delay, MD on 12/23/2018   01/05/2019 Imaging   MRI brain 1. No metastatic disease or acute intracranial abnormality identified. 2. Small chronic parafalcine meningioma size is stable since that described in 2006 (12 x 15 mm). 3. Advanced signal changes in the cerebral white matter and to a lesser extent pons, nonspecific but most commonly due to chronic small vessel disease. 4. Partially visible  degenerative cervical spinal stenosis.     01/07/2019 -  Chemotherapy   The patient had carboplatin and Alimta for chemotherapy treatment.     04/14/2019 PET scan   1. 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 As shown on recent MRI of 04/01/2019, there is a new component of invasion into the left lower L3 vertebral body. This new component has a maximum SUV of 10.0, compatible with active malignancy. 2. Interval wedge resection of portions of the right upper lobe and right lower lobe at the site of the prior nodule. There is only minimal low-grade activity along the wedge resection clips, maximum SUV of 1.5. Surveillance is likely warranted. No new nodule identified. 3. Other imaging findings of potential clinical significance: Aortic Atherosclerosis (ICD10-I70.0). Coronary atherosclerosis. Thoracolumbar scoliosis. Cholelithiasis. Sigmoid colon diverticulosis.     05/03/2019 -  Chemotherapy   The patient had Pembrolizumab and Lenvima for chemotherapy treatment.     05/22/2019 Imaging   Ct head Atrophy, chronic microvascular disease.   No acute intracranial abnormality.       REVIEW OF SYSTEMS:   Constitutional: Denies fevers, chills or abnormal weight loss Eyes: Denies blurriness of vision Ears, nose, mouth, throat, and face: Denies mucositis or sore throat Respiratory: Denies cough, dyspnea or wheezes Cardiovascular: Denies palpitation, chest discomfort  Gastrointestinal:  Denies nausea, heartburn or change in bowel habits Lymphatics: Denies new lymphadenopathy or easy bruising Neurological:Denies numbness, tingling or new weaknesses Behavioral/Psych: Mood is stable, no new changes  All other systems were reviewed with the patient and are negative.  I have reviewed the past medical history, past surgical history, social history and family history with the patient and they are unchanged from previous note.  ALLERGIES:  is allergic to ace inhibitors,  dilaudid [hydromorphone hcl], and codeine.  MEDICATIONS:  Current Outpatient Medications  Medication Sig Dispense Refill  . amLODipine (NORVASC) 5 MG tablet TAKE 1 TABLET BY MOUTH EVERY DAY 90 tablet 1  . atenolol (TENORMIN) 25 MG tablet Take 25 mg by mouth 2 (two) times daily.     . furosemide (LASIX) 20 MG tablet Take 1 tablet (20 mg total) by mouth daily. 10 tablet 0  . HYDROcodone-acetaminophen (NORCO/VICODIN) 5-325 MG tablet Take 1 tablet by mouth every 4 (four) hours as needed. 12 tablet 0  . lenvatinib 10 mg daily dose (LENVIMA, 10 MG DAILY DOSE,) capsule Take 1 capsule every other day by mouth 30 capsule 11  . lidocaine-prilocaine (EMLA) cream  Apply to [facility] cath 1-2 hours prior to access as directed 30 g 1  . LORazepam (ATIVAN) 0.5 MG tablet 1 tablet po 30 minutes prior to radiation or MRI 30 tablet 0  . memantine (NAMENDA) 5 MG tablet TAKE 1 TABLET DAILY FOR ONE WEEK, THEN TAKE 1 TABLET TWICE DAILY FOR ONE WEEK, THEN TAKE 1 TABLET IN THE MORNING AND 2 IN THE EVENING FOR ONE WEEK, THEN TAKE 2 TABLETS TWICE DAILY 210 tablet 1  . mirtazapine (REMERON) 7.5 MG tablet TAKE 1 TABLET (7.5 MG TOTAL) BY MOUTH AT BEDTIME. 90 tablet 2  . Multiple Vitamin (MULTIVITAMIN WITH MINERALS) TABS tablet Take 1 tablet by mouth daily.    . potassium chloride (KLOR-CON) 10 MEQ tablet Take 1 tablet (10 mEq total) by mouth daily. 10 tablet 0  . rivaroxaban (XARELTO) 10 MG TABS tablet Take 1 tablet (10 mg total) by mouth daily. 30 tablet 11  . simvastatin (ZOCOR) 20 MG tablet Take 20 mg by mouth every evening.      No current facility-administered medications for this visit.   Facility-Administered Medications Ordered in Other Visits  Medication Dose Route Frequency Provider Last Rate Last Admin  . heparin lock flush 100 unit/mL  500 Units Intracatheter Once Alvy Bimler, Finlay Godbee, MD      . sodium chloride flush (NS) 0.9 % injection 10 mL  10 mL Intracatheter Once Alvy Bimler, Demetrice Amstutz, MD        PHYSICAL EXAMINATION: ECOG  PERFORMANCE STATUS: 2 - Symptomatic, <50% confined to bed  Vitals:   01/11/20 1223  BP: (!) 146/56  Pulse: 74  Resp: 16  Temp: (!) 96.7 F (35.9 C)  SpO2: 100%   Filed Weights   01/11/20 1223  Weight: 107 lb 9.6 oz (48.8 kg)    GENERAL:alert, no distress and comfortable NEURO: alert & oriented x 3 with fluent speech, no focal motor/sensory deficits. Noted poor memory recall  LABORATORY DATA:  I have reviewed the data as listed    Component Value Date/Time   NA 140 01/11/2020 1210   NA 140 05/09/2017 0844   K 4.3 01/11/2020 1210   K 4.1 05/09/2017 0844   CL 110 01/11/2020 1210   CO2 25 01/11/2020 1210   CO2 26 05/09/2017 0844   GLUCOSE 86 01/11/2020 1210   GLUCOSE 82 05/09/2017 0844   BUN 31 (H) 01/11/2020 1210   BUN 20.7 05/09/2017 0844   CREATININE 0.80 01/11/2020 1210   CREATININE 0.7 05/09/2017 0844   CALCIUM 10.0 01/11/2020 1210   CALCIUM 9.3 05/09/2017 0844   PROT 6.3 (L) 01/11/2020 1210   PROT 6.8 05/09/2017 0844   ALBUMIN 3.2 (L) 01/11/2020 1210   ALBUMIN 3.6 05/09/2017 0844   AST 28 01/11/2020 1210   AST 27 05/09/2017 0844   ALT 24 01/11/2020 1210   ALT 17 05/09/2017 0844   ALKPHOS 61 01/11/2020 1210   ALKPHOS 43 05/09/2017 0844   BILITOT 0.4 01/11/2020 1210   BILITOT 0.81 05/09/2017 0844   GFRNONAA >60 01/11/2020 1210   GFRAA >60 01/11/2020 1210    No results found for: SPEP, UPEP  Lab Results  Component Value Date   WBC 7.6 01/11/2020   NEUTROABS 4.9 01/11/2020   HGB 7.8 (L) 01/11/2020   HCT 25.9 (L) 01/11/2020   MCV 96.3 01/11/2020   PLT 235 01/11/2020      Chemistry      Component Value Date/Time   NA 140 01/11/2020 1210   NA 140 05/09/2017 0844   K 4.3 01/11/2020  1210   K 4.1 05/09/2017 0844   CL 110 01/11/2020 1210   CO2 25 01/11/2020 1210   CO2 26 05/09/2017 0844   BUN 31 (H) 01/11/2020 1210   BUN 20.7 05/09/2017 0844   CREATININE 0.80 01/11/2020 1210   CREATININE 0.7 05/09/2017 0844      Component Value Date/Time    CALCIUM 10.0 01/11/2020 1210   CALCIUM 9.3 05/09/2017 0844   ALKPHOS 61 01/11/2020 1210   ALKPHOS 43 05/09/2017 0844   AST 28 01/11/2020 1210   AST 27 05/09/2017 0844   ALT 24 01/11/2020 1210   ALT 17 05/09/2017 0844   BILITOT 0.4 01/11/2020 1210   BILITOT 0.81 05/09/2017 0844

## 2020-01-12 NOTE — Assessment & Plan Note (Addendum)
She is doing better from wound care perspective She felt that referral with advanced home care was helpful We will continue supportive care For now, she is not a candidate for chemotherapy due to increased risk of infection and worsening delay wound healing I will see her again in a few weeks for further follow-up and transfusion support

## 2020-01-12 NOTE — Assessment & Plan Note (Signed)
She has persistent leg ulcer She saw her primary care doctor recently who applied a special dressing and she is getting advanced home care and nursing staff to check on her wound She has appointment to go to wound care clinic next month She is instructed to stop antibiotics

## 2020-01-14 DIAGNOSIS — I129 Hypertensive chronic kidney disease with stage 1 through stage 4 chronic kidney disease, or unspecified chronic kidney disease: Secondary | ICD-10-CM | POA: Diagnosis not present

## 2020-01-14 DIAGNOSIS — R600 Localized edema: Secondary | ICD-10-CM | POA: Diagnosis not present

## 2020-01-14 DIAGNOSIS — C3431 Malignant neoplasm of lower lobe, right bronchus or lung: Secondary | ICD-10-CM | POA: Diagnosis not present

## 2020-01-14 DIAGNOSIS — N1831 Chronic kidney disease, stage 3a: Secondary | ICD-10-CM | POA: Diagnosis not present

## 2020-01-14 DIAGNOSIS — S81802A Unspecified open wound, left lower leg, initial encounter: Secondary | ICD-10-CM | POA: Diagnosis not present

## 2020-01-14 DIAGNOSIS — C541 Malignant neoplasm of endometrium: Secondary | ICD-10-CM | POA: Diagnosis not present

## 2020-01-14 DIAGNOSIS — D630 Anemia in neoplastic disease: Secondary | ICD-10-CM | POA: Diagnosis not present

## 2020-01-14 DIAGNOSIS — C7951 Secondary malignant neoplasm of bone: Secondary | ICD-10-CM | POA: Diagnosis not present

## 2020-01-14 DIAGNOSIS — I82401 Acute embolism and thrombosis of unspecified deep veins of right lower extremity: Secondary | ICD-10-CM | POA: Diagnosis not present

## 2020-01-14 DIAGNOSIS — L03116 Cellulitis of left lower limb: Secondary | ICD-10-CM | POA: Diagnosis not present

## 2020-01-14 DIAGNOSIS — S81802D Unspecified open wound, left lower leg, subsequent encounter: Secondary | ICD-10-CM | POA: Diagnosis not present

## 2020-01-19 ENCOUNTER — Other Ambulatory Visit: Payer: Self-pay | Admitting: Neurology

## 2020-01-20 DIAGNOSIS — C541 Malignant neoplasm of endometrium: Secondary | ICD-10-CM | POA: Diagnosis not present

## 2020-01-20 DIAGNOSIS — D630 Anemia in neoplastic disease: Secondary | ICD-10-CM | POA: Diagnosis not present

## 2020-01-20 DIAGNOSIS — S81802D Unspecified open wound, left lower leg, subsequent encounter: Secondary | ICD-10-CM | POA: Diagnosis not present

## 2020-01-20 DIAGNOSIS — C3431 Malignant neoplasm of lower lobe, right bronchus or lung: Secondary | ICD-10-CM | POA: Diagnosis not present

## 2020-01-20 DIAGNOSIS — C7951 Secondary malignant neoplasm of bone: Secondary | ICD-10-CM | POA: Diagnosis not present

## 2020-01-20 DIAGNOSIS — L03116 Cellulitis of left lower limb: Secondary | ICD-10-CM | POA: Diagnosis not present

## 2020-01-21 DIAGNOSIS — C7951 Secondary malignant neoplasm of bone: Secondary | ICD-10-CM | POA: Diagnosis not present

## 2020-01-21 DIAGNOSIS — S81802D Unspecified open wound, left lower leg, subsequent encounter: Secondary | ICD-10-CM | POA: Diagnosis not present

## 2020-01-21 DIAGNOSIS — C541 Malignant neoplasm of endometrium: Secondary | ICD-10-CM | POA: Diagnosis not present

## 2020-01-21 DIAGNOSIS — C3431 Malignant neoplasm of lower lobe, right bronchus or lung: Secondary | ICD-10-CM | POA: Diagnosis not present

## 2020-01-21 DIAGNOSIS — D630 Anemia in neoplastic disease: Secondary | ICD-10-CM | POA: Diagnosis not present

## 2020-01-21 DIAGNOSIS — L03116 Cellulitis of left lower limb: Secondary | ICD-10-CM | POA: Diagnosis not present

## 2020-01-24 DIAGNOSIS — L03116 Cellulitis of left lower limb: Secondary | ICD-10-CM | POA: Diagnosis not present

## 2020-01-24 DIAGNOSIS — S81802D Unspecified open wound, left lower leg, subsequent encounter: Secondary | ICD-10-CM | POA: Diagnosis not present

## 2020-01-24 DIAGNOSIS — C7951 Secondary malignant neoplasm of bone: Secondary | ICD-10-CM | POA: Diagnosis not present

## 2020-01-24 DIAGNOSIS — C541 Malignant neoplasm of endometrium: Secondary | ICD-10-CM | POA: Diagnosis not present

## 2020-01-24 DIAGNOSIS — C3431 Malignant neoplasm of lower lobe, right bronchus or lung: Secondary | ICD-10-CM | POA: Diagnosis not present

## 2020-01-24 DIAGNOSIS — D630 Anemia in neoplastic disease: Secondary | ICD-10-CM | POA: Diagnosis not present

## 2020-01-25 DIAGNOSIS — C7951 Secondary malignant neoplasm of bone: Secondary | ICD-10-CM | POA: Diagnosis not present

## 2020-01-25 DIAGNOSIS — C541 Malignant neoplasm of endometrium: Secondary | ICD-10-CM | POA: Diagnosis not present

## 2020-01-25 DIAGNOSIS — M859 Disorder of bone density and structure, unspecified: Secondary | ICD-10-CM | POA: Diagnosis not present

## 2020-01-25 DIAGNOSIS — E039 Hypothyroidism, unspecified: Secondary | ICD-10-CM | POA: Diagnosis not present

## 2020-01-25 DIAGNOSIS — E785 Hyperlipidemia, unspecified: Secondary | ICD-10-CM | POA: Diagnosis not present

## 2020-01-25 DIAGNOSIS — D630 Anemia in neoplastic disease: Secondary | ICD-10-CM | POA: Diagnosis not present

## 2020-01-25 DIAGNOSIS — C3431 Malignant neoplasm of lower lobe, right bronchus or lung: Secondary | ICD-10-CM | POA: Diagnosis not present

## 2020-01-25 DIAGNOSIS — L03116 Cellulitis of left lower limb: Secondary | ICD-10-CM | POA: Diagnosis not present

## 2020-01-25 DIAGNOSIS — S81802D Unspecified open wound, left lower leg, subsequent encounter: Secondary | ICD-10-CM | POA: Diagnosis not present

## 2020-01-26 ENCOUNTER — Other Ambulatory Visit: Payer: Self-pay

## 2020-01-26 ENCOUNTER — Encounter (HOSPITAL_BASED_OUTPATIENT_CLINIC_OR_DEPARTMENT_OTHER): Payer: Medicare Other | Attending: Physician Assistant | Admitting: Physician Assistant

## 2020-01-26 DIAGNOSIS — Z86718 Personal history of other venous thrombosis and embolism: Secondary | ICD-10-CM | POA: Diagnosis not present

## 2020-01-26 DIAGNOSIS — L97829 Non-pressure chronic ulcer of other part of left lower leg with unspecified severity: Secondary | ICD-10-CM | POA: Diagnosis present

## 2020-01-26 DIAGNOSIS — L97822 Non-pressure chronic ulcer of other part of left lower leg with fat layer exposed: Secondary | ICD-10-CM | POA: Diagnosis not present

## 2020-01-26 DIAGNOSIS — I872 Venous insufficiency (chronic) (peripheral): Secondary | ICD-10-CM | POA: Diagnosis not present

## 2020-01-26 DIAGNOSIS — I10 Essential (primary) hypertension: Secondary | ICD-10-CM | POA: Diagnosis not present

## 2020-01-26 DIAGNOSIS — F0150 Vascular dementia without behavioral disturbance: Secondary | ICD-10-CM | POA: Insufficient documentation

## 2020-01-26 DIAGNOSIS — Z853 Personal history of malignant neoplasm of breast: Secondary | ICD-10-CM | POA: Diagnosis not present

## 2020-01-26 NOTE — Progress Notes (Signed)
[name] ([id number]) Visit Report for 01/26/2020 Chief Complaint Document Details Patient Name: Date of Service: UPTO [name] 01/26/2020 9:00 A M Medical Record Number: [id number] Patient Account Number: [id number] Date of Birth/Sex: Treating RN: 1940-05-28 (79 y.o. [name] Primary Care Provider: Shon Baton Other Clinician: Referring Provider: Treating Provider/Extender: Laveda Norman, Ni Weeks in Treatment: 0 Information Obtained from: Patient Chief Complaint Left LE Ulcer Electronic Signature(s) Signed: 01/26/2020 10:21:47 AM By: [name] PA-C Entered By: [name] on 01/26/2020 10:21:46 -------------------------------------------------------------------------------- Debridement Details Patient Name: Date of Service: UPTO Kelly Fila B. 01/26/2020 9:00 A M Medical Record Number: [id number] Patient Account Number: [id number] Date of Birth/Sex: Treating RN: 1940-10-20 (79 y.o. [name] Primary Care Provider: Shon Baton Other Clinician: Referring Provider: Treating Provider/Extender: Laveda Norman, Ni Weeks in Treatment: 0 Debridement Performed for Assessment: Wound #1 Left,Anterior Lower Leg Performed By: Physician [name], PA Debridement Type: Debridement Severity of Tissue Pre Debridement: Fat layer exposed Level of Consciousness (Pre-procedure): Awake and Alert Pre-procedure Verification/Time Out Yes - 10:25 Taken: Start Time: 10:26 Pain Control: Other : benzocaine 20% spray T Area Debrided (L x W): otal 1.5 (cm) x 0.4 (cm) = 0.6 (cm) Tissue and other material debrided: Viable, Non-Viable, Slough, Subcutaneous, Slough Level: Skin/Subcutaneous Tissue Debridement Description: Excisional Instrument: Curette Bleeding: Minimum Hemostasis Achieved: Pressure End Time: 10:29 Procedural Pain: 5 Post Procedural Pain: 2 Response to Treatment: Procedure was tolerated well Level of Consciousness (Post-  Awake and Alert procedure): Post Debridement Measurements of Total Wound Length: (cm) 1.5 Width: (cm) 0.4 Depth: (cm) 0.3 Volume: (cm) 0.141 Character of Wound/Ulcer Post Debridement: Improved Severity of Tissue Post Debridement: Fat layer exposed Post Procedure Diagnosis Same as Pre-procedure Electronic Signature(s) Signed: 01/26/2020 6:07:28 PM By: [name] RN, BSN Signed: 01/26/2020 6:26:36 PM By: [name] PA-C Entered By: [name] on 01/26/2020 10:28:06 -------------------------------------------------------------------------------- HPI Details Patient Name: Date of Service: UPTO Kelly Fila B. 01/26/2020 9:00 A M Medical Record Number: [id number] Patient Account Number: [id number] Date of Birth/Sex: Treating RN: 1940-12-08 (79 y.o. [name] Primary Care Provider: Shon Baton Other Clinician: Referring Provider: Treating Provider/Extender: Laveda Norman, Ni Weeks in Treatment: 0 History of Present Illness HPI Description: 01/26/2020 on evaluation today patient presents for initial evaluation here in our clinic concerning issues she has been having with a wound over her shin of the left leg. She tells me that this occurred near the beginning of June 2021 when she scraped her leg on something that she would not disclose. With that being said she tells me that she has been using a DuoDERM dressing over this as recommended unfortunately that really has not made a big improvement. It is possible based on what I am seeing that she likely had a hematoma which led to a deeper wound which is why it really has not healed as much as we would like to have seen up to this point. Fortunately there is no signs of active infection at this point she has completed roughly 2 weeks ago a 2- week prescription of doxycycline. The patient is currently on Eliquis due to a history of DVT She also has cancer that the chemotherapy is on hold right now I s. do not  believe that the chemotherapy was likely providing a huge issue as far as redness from healed lesion I think it is probably more likely that she does has a deeper wound than what  is obvious and that needs to be addressed. Patient also has a history of multiple sites of carcinoma including her breast among other things. She also has mild dementia for which she is on medication. Electronic Signature(s) Signed: 01/26/2020 10:35:17 AM By: [name] PA-C Entered By: [name] on 01/26/2020 10:35:17 -------------------------------------------------------------------------------- Physical Exam Details Patient Name: Date of Service: UPTO Kelly Fila B. 01/26/2020 9:00 A M Medical Record Number: [id number] Patient Account Number: [id number] Date of Birth/Sex: Treating RN: May 21, 1940 (79 y.o. [name] Primary Care Provider: Shon Baton Other Clinician: Referring Provider: Treating Provider/Extender: Laveda Norman, Ni Weeks in Treatment: 0 Constitutional Well-nourished and well-hydrated in no acute distress. Respiratory normal breathing without difficulty. Psychiatric this patient is able to make decisions and demonstrates good insight into disease process. Alert and Oriented x 3. pleasant and cooperative. Notes Upon inspection patient's wound bed actually showed signs of good granulation at this time and some areas she did not some epithelization unfortunately she also had some depth to the wound and necrotic tissue which did require sharp debridement. Actually perform sharp debridement today to clear away the necrotic debris which she tolerated without complication and post debridement the wound bed appears to be doing much better which is great news. Electronic Signature(s) Signed: 01/26/2020 10:35:40 AM By: [name] PA-C Entered By: [name] on 01/26/2020  10:35:39 -------------------------------------------------------------------------------- Physician Orders Details Patient Name: Date of Service: UPTO Kelly Fila B. 01/26/2020 9:00 A M Medical Record Number: [id number] Patient Account Number: [id number] Date of Birth/Sex: Treating RN: July 25, 1940 (79 y.o. [name] Primary Care Provider: Shon Baton Other Clinician: Referring Provider: Treating Provider/Extender: Laveda Norman, Ni Weeks in Treatment: 0 Verbal / Phone Orders: No Diagnosis Coding ICD-10 Coding Code Description I87.2 Venous insufficiency (chronic) (peripheral) L97.822 Non-pressure chronic ulcer of other part of left lower leg with fat layer exposed F01.50 Vascular dementia without behavioral disturbance Z85.9 Personal history of malignant neoplasm, unspecified Follow-up Appointments Return Appointment in 2 weeks. Dressing Change Frequency Wound #1 Left,Anterior Lower Leg Change Dressing every other day. Wound Cleansing Wound #1 Left,Anterior Lower Leg May shower with protection. - on days you do not change dressing May shower and wash wound with soap and water. - with dressing changes Primary Wound Dressing Wound #1 Left,Anterior Lower Leg lginate with Silver - pack lightly into wound to fill space Calcium A Secondary Dressing Wound #1 Left,Anterior Lower Leg Foam Border Edema Control Avoid standing for long periods of time Elevate legs to the level of the heart or above for 30 minutes daily and/or when sitting, a frequency of: Exercise regularly [facility] for wound care. - [facility] Patient Medications llergies: ACE Inhibitors, Dilaudid, codeine A Notifications Medication Indication Start End prior to debridement 01/26/2020 benzocaine DOSE topical 20 % aerosol - aerosol topical Electronic Signature(s) Signed: 01/26/2020 6:07:28 PM By: [name] RN, BSN Signed: 01/26/2020 6:26:36  PM By: [name] PA-C Entered By: [name] on 01/26/2020 10:36:48 -------------------------------------------------------------------------------- Problem List Details Patient Name: Date of Service: UPTO Kelly Fila B. 01/26/2020 9:00 A M Medical Record Number: [id number] Patient Account Number: [id number] Date of Birth/Sex: Treating RN: 01-23-1941 (79 y.o. [name] Primary Care Provider: Shon Baton Other Clinician: Referring Provider: Treating Provider/Extender: Laveda Norman, Ni Weeks in Treatment: 0 Active Problems ICD-10 Encounter Code Description Active Date MDM Diagnosis I87.2 Venous insufficiency (chronic) (peripheral) 01/26/2020 No Yes L97.822 Non-pressure chronic ulcer  of other part of left lower leg with fat layer exposed9/15/2021 No Yes F01.50 Vascular dementia without behavioral disturbance 01/26/2020 No Yes Z85.9 Personal history of malignant neoplasm, unspecified 01/26/2020 No Yes Inactive Problems Resolved Problems Electronic Signature(s) Signed: 01/26/2020 10:21:33 AM By: [name] PA-C Entered By: [name] on 01/26/2020 10:21:33 -------------------------------------------------------------------------------- Progress Note Details Patient Name: Date of Service: UPTO Kelly Fila B. 01/26/2020 9:00 A M Medical Record Number: [id number] Patient Account Number: [id number] Date of Birth/Sex: Treating RN: May 11, 1941 (79 y.o. [name] Primary Care Provider: Shon Baton Other Clinician: Referring Provider: Treating Provider/Extender: Laveda Norman, Ni Weeks in Treatment: 0 Subjective Chief Complaint Information obtained from Patient Left LE Ulcer History of Present Illness (HPI) 01/26/2020 on evaluation today patient presents for initial evaluation here in our clinic concerning issues she has been having with a wound over her shin of the left leg. She tells me that this occurred near the beginning of June 2021  when she scraped her leg on something that she would not disclose. With that being said she tells me that she has been using a DuoDERM dressing over this as recommended unfortunately that really has not made a big improvement. It is possible based on what I am seeing that she likely had a hematoma which led to a deeper wound which is why it really has not healed as much as we would like to have seen up to this point. Fortunately there is no signs of active infection at this point she has completed roughly 2 weeks ago a 2-week prescription of doxycycline. The patient is currently on Eliquis due to a history of DVT She also has cancer that the chemotherapy is on hold right now I do not believe that s. the chemotherapy was likely providing a huge issue as far as redness from healed lesion I think it is probably more likely that she does has a deeper wound than what is obvious and that needs to be addressed. Patient also has a history of multiple sites of carcinoma including her breast among other things. She also has mild dementia for which she is on medication. Patient History Information obtained from Patient. Allergies ACE Inhibitors (Severity: Severe, Reaction: anaphylaxis), Dilaudid (Severity: Severe, Reaction: change in mental status), codeine (Reaction: nausea) Family History Heart Disease - Mother,Siblings, Hypertension - Father, Stroke - Maternal Grandparents, No family history of Cancer, Diabetes, Hereditary Spherocytosis, Kidney Disease, Lung Disease, Seizures, Thyroid Problems, Tuberculosis. Social History Former smoker - quit over 30 years ago, Marital Status - Married, Alcohol Use - Never, Drug Use - No History, Caffeine Use - Daily - coffee. Medical History Hematologic/Lymphatic Patient has history of Anemia Cardiovascular Patient has history of Deep Vein Thrombosis - right leg, Hypertension Oncologic Patient has history of Received Chemotherapy, Received Radiation Medical A  Surgical History Notes nd Cardiovascular Hyperlipidemia Oncologic Hx Endometrial, lung, and breast cancer Review of Systems (ROS) Constitutional Symptoms (General Health) Denies complaints or symptoms of Fatigue, Fever, Chills, Marked Weight Change. Eyes Denies complaints or symptoms of Dry Eyes, Vision Changes, Glasses / Contacts. Ear/Nose/Mouth/Throat Denies complaints or symptoms of Chronic sinus problems or rhinitis. Respiratory Denies complaints or symptoms of Chronic or frequent coughs, Shortness of Breath. Gastrointestinal Denies complaints or symptoms of Frequent diarrhea, Nausea, Vomiting. Endocrine Denies complaints or symptoms of Heat/cold intolerance. Genitourinary Denies complaints or symptoms of Frequent urination. Integumentary (Skin) Complains or has symptoms of Wounds - wound on left lower leg. Musculoskeletal Denies complaints or symptoms of Muscle  Pain, Muscle Weakness. Neurologic Denies complaints or symptoms of Numbness/parasthesias. Psychiatric Denies complaints or symptoms of Claustrophobia, Suicidal. Objective Constitutional Well-nourished and well-hydrated in no acute distress. Vitals Time Taken: 9:24 AM, Height: 59 in, Source: Stated, Weight: 108 lbs, Source: Stated, BMI: 21.8, Temperature: 98.5 F, Pulse: 71 bpm, Respiratory Rate: 16 breaths/min, Blood Pressure: 164/81 mmHg. Respiratory normal breathing without difficulty. Psychiatric this patient is able to make decisions and demonstrates good insight into disease process. Alert and Oriented x 3. pleasant and cooperative. General Notes: Upon inspection patient's wound bed actually showed signs of good granulation at this time and some areas she did not some epithelization unfortunately she also had some depth to the wound and necrotic tissue which did require sharp debridement. Actually perform sharp debridement today to clear away the necrotic debris which she tolerated without complication and  post debridement the wound bed appears to be doing much better which is great news. Integumentary (Hair, Skin) Wound #1 status is Open. Original cause of wound was Trauma. The wound is located on the Left,Anterior Lower Leg. The wound measures 1.5cm length x 0.4cm width x 0.3cm depth; 0.471cm^2 area and 0.141cm^3 volume. There is Fat Layer (Subcutaneous Tissue) exposed. There is no tunneling or undermining noted. There is a medium amount of serosanguineous drainage noted. The wound margin is epibole. There is medium (34-66%) red granulation within the wound bed. There is a medium (34-66%) amount of necrotic tissue within the wound bed including Adherent Slough. Assessment Active Problems ICD-10 Venous insufficiency (chronic) (peripheral) Non-pressure chronic ulcer of other part of left lower leg with fat layer exposed Vascular dementia without behavioral disturbance Personal history of malignant neoplasm, unspecified Procedures Wound #1 Pre-procedure diagnosis of Wound #1 is a Venous Leg Ulcer located on the Left,Anterior Lower Leg .Severity of Tissue Pre Debridement is: Fat layer exposed. There was a Excisional Skin/Subcutaneous Tissue Debridement with a total area of 0.6 sq cm performed by [name], PA. With the following instrument(s): Curette to remove Viable and Non-Viable tissue/material. Material removed includes Subcutaneous Tissue and Slough and after achieving pain control using Other (benzocaine 20% spray). No specimens were taken. A time out was conducted at 10:25, prior to the start of the procedure. A Minimum amount of bleeding was controlled with Pressure. The procedure was tolerated well with a pain level of 5 throughout and a pain level of 2 following the procedure. Post Debridement Measurements: 1.5cm length x 0.4cm width x 0.3cm depth; 0.141cm^3 volume. Character of Wound/Ulcer Post Debridement is improved. Severity of Tissue Post Debridement is: Fat layer  exposed. Post procedure Diagnosis Wound #1: Same as Pre-Procedure Plan Follow-up Appointments: Return Appointment in 2 weeks. Dressing Change Frequency: Wound #1 Left,Anterior Lower Leg: Change Dressing every other day. Wound Cleansing: Wound #1 Left,Anterior Lower Leg: May shower with protection. - on days you do not change dressing May shower and wash wound with soap and water. - with dressing changes Primary Wound Dressing: Wound #1 Left,Anterior Lower Leg: Calcium Alginate with Silver - pack lightly into wound to fill space Secondary Dressing: Wound #1 Left,Anterior Lower Leg: Foam Border Edema Control: Avoid standing for long periods of time Elevate legs to the level of the heart or above for 30 minutes daily and/or when sitting, a frequency of: Exercise regularly Home Health: [facility] for wound care. [facility] The following medication(s) was prescribed: benzocaine topical 20 % aerosol aerosol topical for prior to debridement was prescribed at facility 1. I would recommend  at this point that the patient continue to change the dressing every 2 days. I would recommend a silver alginate dressing currently. 2. I am also can recommend that the patient change this after showers and if she is in the shower in between to get a cast protector that was written down on her discharge instructions today as well. 3. We will cover this with a border foam dressing to help provide protection as well as catch any drainage that may occur. We will see patient back for reevaluation in 2 weeks here in the clinic. If anything worsens or changes patient will contact our office for additional recommendations. Electronic Signature(s) Signed: 01/26/2020 6:07:28 PM By: [name] RN, BSN Signed: 01/26/2020 6:26:36 PM By: [name] PA-C Previous Signature: 01/26/2020 10:36:15 AM Version By: [name] PA-C Entered By: [name] on 01/26/2020  10:38:11 -------------------------------------------------------------------------------- HxROS Details Patient Name: Date of Service: UPTO Kelly Fila B. 01/26/2020 9:00 A M Medical Record Number: [id number] Patient Account Number: [id number] Date of Birth/Sex: Treating RN: [birth year] (79 y.o. Nancy Fetter Primary Care Provider: Shon Baton Other Clinician: Referring Provider: Treating Provider/Extender: Laveda Norman, Ni Weeks in Treatment: 0 Information Obtained From Patient Constitutional Symptoms (General Health) Complaints and Symptoms: Negative for: Fatigue; Fever; Chills; Marked Weight Change Eyes Complaints and Symptoms: Negative for: Dry Eyes; Vision Changes; Glasses / Contacts Ear/Nose/Mouth/Throat Complaints and Symptoms: Negative for: Chronic sinus problems or rhinitis Respiratory Complaints and Symptoms: Negative for: Chronic or frequent coughs; Shortness of Breath Gastrointestinal Complaints and Symptoms: Negative for: Frequent diarrhea; Nausea; Vomiting Endocrine Complaints and Symptoms: Negative for: Heat/cold intolerance Genitourinary Complaints and Symptoms: Negative for: Frequent urination Integumentary (Skin) Complaints and Symptoms: Positive for: Wounds - wound on left lower leg Musculoskeletal Complaints and Symptoms: Negative for: Muscle Pain; Muscle Weakness Neurologic Complaints and Symptoms: Negative for: Numbness/parasthesias Psychiatric Complaints and Symptoms: Negative for: Claustrophobia; Suicidal Hematologic/Lymphatic Medical History: Positive for: Anemia Cardiovascular Medical History: Positive for: Deep Vein Thrombosis - right leg; Hypertension Past Medical History Notes: Hyperlipidemia Immunological Oncologic Medical History: Positive for: Received Chemotherapy; Received Radiation Past Medical History Notes: Hx Endometrial, lung, and breast cancer Immunizations Pneumococcal Vaccine: Received Pneumococcal  Vaccination: Yes Implantable Devices None Family and Social History Cancer: No; Diabetes: No; Heart Disease: Yes - Mother,Siblings; Hereditary Spherocytosis: No; Hypertension: Yes - Father; Kidney Disease: No; Lung Disease: No; Seizures: No; Stroke: Yes - Maternal Grandparents; Thyroid Problems: No; Tuberculosis: No; Former smoker - quit over 30 years ago; Marital Status - Married; Alcohol Use: Never; Drug Use: No History; Caffeine Use: Daily - coffee; Financial Concerns: No; Food, Clothing or Shelter Needs: No; Support System Lacking: No; Transportation Concerns: No Electronic Signature(s) Signed: 01/26/2020 5:57:30 PM By: Levan Hurst RN, BSN Signed: 01/26/2020 6:26:36 PM By: [name] PA-C Entered By: Levan Hurst on 01/26/2020 10:12:44 -------------------------------------------------------------------------------- SuperBill Details Patient Name: Date of Service: UPTO Kelly Fila B. 01/26/2020 Medical Record Number: [id number] Patient Account Number: [id number] Date of Birth/Sex: Treating RN: 11/17/1940 (79 y.o. [name] Primary Care Provider: Shon Baton Other Clinician: Referring Provider: Treating Provider/Extender: Laveda Norman, Ni Weeks in Treatment: 0 Diagnosis Coding ICD-10 Codes Code Description I87.2 Venous insufficiency (chronic) (peripheral) L97.822 Non-pressure chronic ulcer of other part of left lower leg with fat layer exposed F01.50 Vascular dementia without behavioral disturbance Z85.9 Personal history of malignant neoplasm, unspecified Facility Procedures CPT4 Code: 78295621 Description: 30865 - WOUND CARE VISIT-LEV 3 EST PT Modifier: Quantity: 1 CPT4 Code: [id number] Description: 52841 -  DEB SUBQ TISSUE 20 SQ CM/< ICD-10 Diagnosis Description L97.822 Non-pressure chronic ulcer of other part of left lower leg with fat layer expos Modifier: ed Quantity: 1 Physician Procedures : CPT4 Code Description Modifier [id number] WC PHYS LEVEL  3 NEW PT 25 1 ICD-10 Diagnosis Description I87.2 Venous insufficiency (chronic) (peripheral) L97.822 Non-pressure chronic ulcer of other part of left lower leg with fat layer exposed F01.50 Vascular  dementia without behavioral disturbance Z85.9 Personal history of malignant neoplasm, unspecified Quantity: : [id number] 11042 - WC PHYS SUBQ TISS 20 SQ CM 1 ICD-10 Diagnosis Description L97.822 Non-pressure chronic ulcer of other part of left lower leg with fat layer exposed Quantity: Electronic Signature(s) Signed: 01/26/2020 10:42:02 AM By: [name] PA-C Entered By: [name] on 01/26/2020 10:42:02

## 2020-01-26 NOTE — Progress Notes (Signed)
[name], [name] ([id number]) Visit Report for 01/26/2020 Abuse/Suicide Risk Screen Details Patient Name: Date of Service: UPTO [name] 01/26/2020 9:00 A M Medical Record Number: [id number] Patient Account Number: [id number] Date of Birth/Sex: Treating RN: 08/05/1940 (79 y.o. [name] Primary Care [name]: [name] Other Clinician: Referring [name]: Treating [name]/Extender: [name], [name] in Treatment: 0 Abuse/Suicide Risk Screen Items Answer ABUSE RISK SCREEN: Has anyone close to you tried to hurt or harm you recentlyo No Do you feel uncomfortable with anyone in your familyo No Has anyone forced you do things that you didnt want to doo No Electronic Signature(s) Signed: 01/26/2020 5:57:30 PM By: Kelly Hurst RN, BSN Entered By: [name] on 01/26/2020 09:41:56 -------------------------------------------------------------------------------- Activities of Daily Living Details Patient Name: Date of Service: UPTO Kelly Fila B. 01/26/2020 9:00 A M Medical Record Number: [id number] Patient Account Number: [id number] Date of Birth/Sex: Treating RN: 06/26/1940 (79 y.o. [name] Primary Care [name]: [name] Other Clinician: Referring [name]: Treating [name]/Extender: [name], [name] in Treatment: 0 Activities of Daily Living Items Answer Activities of Daily Living (Please select one for each item) Drive Automobile Completely Able T Medications ake Completely Able Use T elephone Completely Able Care for Appearance Completely Able Use T oilet Completely Able Bath / Shower Completely Able Dress Self Completely Able Feed Self Completely Able Walk Completely Able Get In / Out Bed Completely Able Housework Completely Able Prepare Meals Completely [place] for Self Completely Able Electronic Signature(s) Signed: 01/26/2020 5:57:30 PM By: Kelly Hurst RN, BSN Entered By:  [name] on 01/26/2020 09:42:20 -------------------------------------------------------------------------------- Education Screening Details Patient Name: Date of Service: UPTO Kelly Fila B. 01/26/2020 9:00 A M Medical Record Number: [id number] Patient Account Number: [id number] Date of Birth/Sex: Treating RN: 1941/03/17 (79 y.o. [name] Primary Care [name]: [name] Other Clinician: Referring [name]: Treating [name]/Extender: [name], [name] in Treatment: 0 Primary Learner Assessed: Patient Learning Preferences/Education Level/Primary Language Learning Preference: Explanation, Demonstration, Printed Material Highest Education Level: College or Above Preferred Language: English Cognitive Barrier Language Barrier: No Translator Needed: No Memory Deficit: No Emotional Barrier: No Cultural/Religious Beliefs Affecting Medical Care: No Physical Barrier Impaired Vision: No Impaired Hearing: No Decreased Hand dexterity: No Knowledge/Comprehension Knowledge Level: High Comprehension Level: High Ability to understand written instructions: High Ability to understand verbal instructions: High Motivation Anxiety Level: Calm Cooperation: Cooperative Education Importance: Acknowledges Need Interest in Health Problems: Asks Questions Perception: Coherent Willingness to Engage in Self-Management High Activities: Readiness to Engage in Self-Management High Activities: Electronic Signature(s) Signed: 01/26/2020 5:57:30 PM By: Kelly Hurst RN, BSN Entered By: [name] on 01/26/2020 09:42:42 -------------------------------------------------------------------------------- Fall Risk Assessment Details Patient Name: Date of Service: UPTO Kelly Fila B. 01/26/2020 9:00 A M Medical Record Number: [id number] Patient Account Number: [id number] Date of Birth/Sex: Treating RN: Oct 07, 1940 (79 y.o. [name] Primary Care [name]: [name] Other Clinician: Referring [name]: Treating [name]/Extender: [name], [name] in Treatment: 0 Fall Risk Assessment Items Have you had 2 or more falls in the last 12 monthso 0 No Have you had any fall that resulted in injury in the last 12 monthso 0 Yes FALLS RISK SCREEN History of falling - immediate or within 3 months 0 No Secondary diagnosis (Do you have 2 or more medical diagnoseso) 15 Yes Ambulatory aid None/bed rest/wheelchair/nurse 0 Yes Crutches/cane/walker 0 No Furniture 0 No Intravenous therapy Access/Saline/Heparin [place] Clap  0 No Gait/Transferring Normal/ bed rest/ wheelchair 0 Yes Weak (short steps with or without shuffle, stooped but able to lift head while walking, may seek 0 No support from furniture) Impaired (short steps with shuffle, may have difficulty arising from chair, head down, impaired 0 No balance) Mental Status Oriented to own ability 0 Yes Electronic Signature(s) Signed: 01/26/2020 5:57:30 PM By: Kelly Hurst RN, BSN Entered By: [name] on 01/26/2020 09:43:10 -------------------------------------------------------------------------------- Foot Assessment Details Patient Name: Date of Service: UPTO Kelly Fila B. 01/26/2020 9:00 A M Medical Record Number: [id number] Patient Account Number: [id number] Date of Birth/Sex: Treating RN: 07-08-1940 (79 y.o. [name] Primary Care [name]: [name] Other Clinician: Referring [name]: Treating [name]/Extender: [name], [name] in Treatment: 0 Foot Assessment Items Site Locations + = Sensation present, - = Sensation absent, C = Callus, U = Ulcer R = Redness, W = Warmth, M = Maceration, PU = Pre-ulcerative lesion F = Fissure, S = Swelling, D = Dryness Assessment Right: Left: Other Deformity: No No Prior Foot Ulcer: No No Prior Amputation: No No Charcot Joint: No No Ambulatory Status: Ambulatory Without Help Gait: Steady Electronic  Signature(s) Signed: 01/26/2020 5:57:30 PM By: Kelly Hurst RN, BSN Entered By: [name] on 01/26/2020 09:44:32 -------------------------------------------------------------------------------- Nutrition Risk Screening Details Patient Name: Date of Service: UPTO Kelly Fila B. 01/26/2020 9:00 A M Medical Record Number: [id number] Patient Account Number: [id number] Date of Birth/Sex: Treating RN: Feb 05, 1941 (79 y.o. [name] Primary Care Marquell Saenz: [name] Other Clinician: Referring Camary Sosa: Treating Sandrina Heaton/Extender: [name], [name] in Treatment: 0 Height (in): 59 Weight (lbs): 108 Body Mass Index (BMI): 21.8 Nutrition Risk Screening Items Score Screening NUTRITION RISK SCREEN: I have an illness or condition that made me change the kind and/or amount of food I eat 0 No I eat fewer than two meals per day 0 No I eat few fruits and vegetables, or milk products 0 No I have three or more drinks of beer, liquor or wine almost every day 0 No I have tooth or mouth problems that make it hard for me to eat 0 No I don't always have enough money to buy the food I need 0 No I eat alone most of the time 0 No I take three or more different prescribed or over-the-counter drugs a day 1 Yes Without wanting to, I have lost or gained 10 pounds in the last six months 2 Yes I am not always physically able to shop, cook and/or feed myself 0 No Nutrition Protocols Good Risk Protocol Moderate Risk Protocol 0 Provide education on nutrition High Risk Proctocol Risk Level: Moderate Risk Score: 3 Electronic Signature(s) Signed: 01/26/2020 5:57:30 PM By: Kelly Hurst RN, BSN Entered By: [name] on 01/26/2020 09:43:34

## 2020-01-31 NOTE — Progress Notes (Signed)
[name], [name] ([id number]) Visit Report for 01/26/2020 Allergy List Details Patient Name: Date of Service: UPTO [name] 01/26/2020 9:00 A M Medical Record Number: [id number] Patient Account Number: [id number] Date of Birth/Sex: Treating RN: Nov 14, 1940 (79 y.o. Female) Levan Hurst Primary Care Delno Blaisdell: Shon Baton Other Clinician: Referring Delroy Ordway: Treating Deamonte Sayegh/Extender: Laveda Norman, Ni Weeks in Treatment: 0 Allergies Active Allergies ACE Inhibitors Reaction: anaphylaxis Severity: Severe Dilaudid Reaction: change in mental status Severity: Severe codeine Reaction: nausea Allergy Notes Electronic Signature(s) Signed: 01/26/2020 5:57:30 PM By: Levan Hurst RN, BSN Entered By: Levan Hurst on 01/26/2020 09:25:58 -------------------------------------------------------------------------------- Arrival Information Details Patient Name: Date of Service: UPTO Kelly Fila B. 01/26/2020 9:00 A M Medical Record Number: [id number] Patient Account Number: [id number] Date of Birth/Sex: Treating RN: Aug 15, 1940 (79 y.o. Female) Levan Hurst Primary Care Anhelica Fowers: Shon Baton Other Clinician: Referring Olvin Rohr: Treating Banyan Goodchild/Extender: Laveda Norman, Ni Weeks in Treatment: 0 Visit Information Patient Arrived: Ambulatory Arrival Time: 09:21 Accompanied By: husband Transfer Assistance: None Patient Identification Verified: Yes Secondary Verification Process Completed: Yes Patient Requires Transmission-Based Precautions: No Patient Has Alerts: Yes Patient Alerts: Patient on Blood Thinner Electronic Signature(s) Signed: 01/26/2020 5:57:30 PM By: Levan Hurst RN, BSN Entered By: Levan Hurst on 01/26/2020 09:23:50 -------------------------------------------------------------------------------- Clinic Level of Care Assessment Details Patient Name: Date of Service: UPTO Kelly Fila B. 01/26/2020 9:00 A M Medical Record Number:  [id number] Patient Account Number: [id number] Date of Birth/Sex: Treating RN: 1940/10/17 (79 y.o. Female) [name] Primary Care Chrisha Vogel: Shon Baton Other Clinician: Referring Kyndell Zeiser: Treating Korbyn Chopin/Extender: Laveda Norman, Ni Weeks in Treatment: 0 Clinic Level of Care Assessment Items TOOL 1 Quantity Score []  - 0 Use when EandM and Procedure is performed on INITIAL visit ASSESSMENTS - Nursing Assessment / Reassessment X- 1 20 General Physical Exam (combine w/ comprehensive assessment (listed just below) when performed on new pt. evals) X- 1 25 Comprehensive Assessment (HX, ROS, Risk Assessments, Wounds Hx, etc.) ASSESSMENTS - Wound and Skin Assessment / Reassessment []  - 0 Dermatologic / Skin Assessment (not related to wound area) ASSESSMENTS - Ostomy and/or Continence Assessment and Care []  - 0 Incontinence Assessment and Management []  - 0 Ostomy Care Assessment and Management (repouching, etc.) PROCESS - Coordination of Care X - Simple Patient / Family Education for ongoing care 1 15 []  - 0 Complex (extensive) Patient / Family Education for ongoing care X- 1 10 Staff obtains Programmer, systems, Records, T Results / Process Orders est X- 1 10 Staff telephones HHA, Nursing Homes / Clarify orders / etc []  - 0 Routine Transfer to another Facility (non-emergent condition) []  - 0 Routine Hospital Admission (non-emergent condition) X- 1 15 New Admissions / Biomedical engineer / Ordering NPWT Apligraf, etc. , []  - 0 Emergency Hospital Admission (emergent condition) PROCESS - Special Needs []  - 0 Pediatric / Minor Patient Management []  - 0 Isolation Patient Management []  - 0 Hearing / Language / Visual special needs []  - 0 Assessment of Community assistance (transportation, D/C planning, etc.) []  - 0 Additional assistance / Altered mentation []  - 0 Support Surface(s) Assessment (bed, cushion, seat, etc.) INTERVENTIONS - Miscellaneous []  - 0 External  ear exam []  - 0 Patient Transfer (multiple staff / Civil Service fast streamer / Similar devices) []  - 0 Simple Staple / Suture removal (25 or less) []  - 0 Complex Staple / Suture removal (26 or more) []  - 0 Hypo/Hyperglycemic Management (do not check if billed separately) X- 1 15 Ankle / Brachial Index (ABI) -  do not check if billed separately Has the patient been seen at the hospital within the last three years: Yes Total Score: 110 Level Of Care: New/Established - Level 3 Electronic Signature(s) Signed: 01/26/2020 6:07:28 PM By: [name] RN, BSN Entered By: [name] on 01/26/2020 10:25:42 -------------------------------------------------------------------------------- Encounter Discharge Information Details Patient Name: Date of Service: UPTO Kelly Fila B. 01/26/2020 9:00 A M Medical Record Number: [id number] Patient Account Number: [id number] Date of Birth/Sex: Treating RN: 1940-10-07 (79 y.o. Female) [name] Primary Care Justen Fonda: Shon Baton Other Clinician: Referring [name]: Treating [name]/Extender: Laveda Norman, Ni Weeks in Treatment: 0 Encounter Discharge Information Items Post Procedure Vitals Discharge Condition: Stable Temperature (F): 98.5 Ambulatory Status: Ambulatory Pulse (bpm): 71 Discharge Destination: Home Respiratory Rate (breaths/min): 16 Transportation: Private Auto Blood Pressure (mmHg): 164/81 Accompanied By: husband Schedule Follow-up Appointment: Yes Clinical Summary of Care: Patient Declined Electronic Signature(s) Signed: 01/31/2020 1:15:48 PM By: [name] RN Entered By: [name] on 01/26/2020 10:46:25 -------------------------------------------------------------------------------- Lower Extremity Assessment Details Patient Name: Date of Service: UPTO Kelly Fila B. 01/26/2020 9:00 A M Medical Record Number: [id number] Patient Account Number: [id number] Date of Birth/Sex: Treating RN: [birth year] (79 y.o. Female)  Levan Hurst Primary Care Talia Hoheisel: Shon Baton Other Clinician: Referring Janiyha Montufar: Treating Camilia Caywood/Extender: Laveda Norman, Ni Weeks in Treatment: 0 Edema Assessment Assessed: [Left: No] [Right: No] Edema: [Left: N] [Right: o] Calf Left: Right: Point of Measurement: 25 cm From Medial Instep 31.6 cm cm Ankle Left: Right: Point of Measurement: 7 cm From Medial Instep 20.2 cm cm Vascular Assessment Pulses: Dorsalis Pedis Palpable: [Left:Yes] Blood Pressure: Brachial: [Left:164] Ankle: [Left:Dorsalis Pedis: 160] Ankle Brachial Index: [Left:0.98] Electronic Signature(s) Signed: 01/26/2020 5:57:30 PM By: Levan Hurst RN, BSN Entered By: Levan Hurst on 01/26/2020 09:50:26 -------------------------------------------------------------------------------- [place] Details Patient Name: Date of Service: UPTO Kelly Fila B. 01/26/2020 9:00 A M Medical Record Number: [id number] Patient Account Number: [id number] Date of Birth/Sex: Treating RN: [birth year] (79 y.o. Female) [name] Primary Care Talise Sligh: Shon Baton Other Clinician: Referring Andriy Sherk: Treating Corinthian Kemler/Extender: Laveda Norman, Ni Weeks in Treatment: 0 Active Inactive Nutrition Nursing Diagnoses: Imbalanced nutrition Goals: Patient/caregiver agrees to and verbalizes understanding of need to use nutritional supplements and/or vitamins as prescribed Date Initiated: 01/26/2020 T arget Resolution Date: 02/25/2020 Goal Status: Active Interventions: Assess patient nutrition upon admission and as needed per policy Provide education on elevated blood sugars and impact on wound healing Notes: Venous Leg Ulcer Nursing Diagnoses: Knowledge deficit related to disease process and management Potential for venous Insuffiency (use before diagnosis confirmed) Goals: Patient will maintain optimal edema control Date Initiated: 01/26/2020 Target Resolution Date:  02/25/2020 Goal Status: Active Interventions: Assess peripheral edema status every visit. Compression as ordered Treatment Activities: T ordered outside of clinic : 01/26/2020 est Therapeutic compression applied : 01/26/2020 Notes: Wound/Skin Impairment Nursing Diagnoses: Impaired tissue integrity Knowledge deficit related to ulceration/compromised skin integrity Goals: Patient/caregiver will verbalize understanding of skin care regimen Date Initiated: 01/26/2020 Target Resolution Date: 02/25/2020 Goal Status: Active Ulcer/skin breakdown will have a volume reduction of 30% by week 4 Date Initiated: 01/26/2020 Target Resolution Date: 02/25/2020 Goal Status: Active Interventions: Assess patient/caregiver ability to obtain necessary supplies Assess patient/caregiver ability to perform ulcer/skin care regimen upon admission and as needed Assess ulceration(s) every visit Provide education on ulcer and skin care Treatment Activities: Skin care regimen initiated : 01/26/2020 Topical wound management initiated : 01/26/2020 Notes: Electronic Signature(s) Signed: 01/26/2020 6:07:28 PM By: [name] RN, BSN Entered  By: [name] on 01/26/2020 10:23:21 -------------------------------------------------------------------------------- Pain Assessment Details Patient Name: Date of Service: UPTO Kelly Fila B. 01/26/2020 9:00 A M Medical Record Number: [id number] Patient Account Number: [id number] Date of Birth/Sex: Treating RN: [birth year] (79 y.o. Female) Levan Hurst Primary Care Maxi Rodas: Shon Baton Other Clinician: Referring Arville Postlewaite: Treating Tecumseh Yeagley/Extender: Laveda Norman, Ni Weeks in Treatment: 0 Active Problems Location of Pain Severity and Description of Pain Patient Has Paino Yes Site Locations Pain Location: Pain in Ulcers With Dressing Change: Yes Duration of the Pain. Constant / Intermittento Intermittent Rate the pain. Current Pain Level:  3 Worst Pain Level: 6 Least Pain Level: 0 Character of Pain Describe the Pain: Tender Pain Management and Medication Current Pain Management: Medication: Yes Cold Application: No Rest: No Massage: No Activity: No T.E.N.S.: No Heat Application: No Leg drop or elevation: No Is the Current Pain Management Adequate: Adequate How does your wound impact your activities of daily livingo Sleep: No Bathing: No Appetite: No Relationship With Others: No Bladder Continence: No Emotions: No Bowel Continence: No Work: No Toileting: No Drive: No Dressing: No Hobbies: No Electronic Signature(s) Signed: 01/26/2020 5:57:30 PM By: Levan Hurst RN, BSN Entered By: Levan Hurst on 01/26/2020 09:52:44 -------------------------------------------------------------------------------- Patient/Caregiver Education Details Patient Name: Date of Service: UPTO [name] 9/15/2021andnbsp9:00 A M Medical Record Number: [id number] Patient Account Number: [id number] Date of Birth/Gender: Treating RN: 08/05/1940 (79 y.o. Female) [name] Primary Care Physician: Shon Baton Other Clinician: Referring Physician: Treating Physician/Extender: Laveda Norman, Ni Weeks in Treatment: 0 Education Assessment Education Provided To: Patient Education Topics Provided Welcome T [facility] [place]: o Handouts: Welcome T [facility] [place] o Methods: Explain/Verbal, Printed Responses: Reinforcements needed, State content correctly Wound Debridement: Handouts: Wound Debridement Methods: Explain/Verbal, Printed Responses: Reinforcements needed, State content correctly Wound/Skin Impairment: Handouts: Caring for Your Ulcer, Skin Care Do's and Dont's Methods: Explain/Verbal, Printed Responses: Reinforcements needed, State content correctly Electronic Signature(s) Signed: 01/26/2020 6:07:28 PM By: [name] RN, BSN Entered By: [name] on 01/26/2020  10:25:02 -------------------------------------------------------------------------------- Wound Assessment Details Patient Name: Date of Service: UPTO Kelly Fila B. 01/26/2020 9:00 A M Medical Record Number: [id number] Patient Account Number: [id number] Date of Birth/Sex: Treating RN: 1940/08/10 (79 y.o. Female) Levan Hurst Primary Care Jaryah Aracena: Shon Baton Other Clinician: Referring Mcgwire Dasaro: Treating Avonelle Viveros/Extender: Laveda Norman, Ni Weeks in Treatment: 0 Wound Status Wound Number: 1 Primary Venous Leg Ulcer Etiology: Wound Location: Left, Anterior Lower Leg Wound Open Wounding Event: Trauma Status: Date Acquired: 10/12/2019 Comorbid Anemia, Deep Vein Thrombosis, Hypertension, Received Weeks Of Treatment: 0 History: Chemotherapy, Received Radiation Clustered Wound: No Photos Photo Uploaded By: Mikeal Hawthorne on 01/27/2020 11:35:20 Wound Measurements Length: (cm) 1.5 Width: (cm) 0.4 Depth: (cm) 0.3 Area: (cm) 0.471 Volume: (cm) 0.141 % Reduction in Area: % Reduction in Volume: Epithelialization: None Tunneling: No Undermining: No Wound Description Classification: Full Thickness Without Exposed Support Structu Wound Margin: Epibole Exudate Amount: Medium Exudate Type: Serosanguineous Exudate Color: red, brown res Foul Odor After Cleansing: No Slough/Fibrino Yes Wound Bed Granulation Amount: Medium (34-66%) Exposed Structure Granulation Quality: Red Fascia Exposed: No Necrotic Amount: Medium (34-66%) Fat Layer (Subcutaneous Tissue) Exposed: Yes Necrotic Quality: Adherent Slough Tendon Exposed: No Muscle Exposed: No Joint Exposed: No Bone Exposed: No Treatment Notes Wound #1 (Left, Anterior Lower Leg) 1. Cleanse With Wound Cleanser 3. Primary Dressing Applied Calcium Alginate Ag 4. Secondary Dressing Foam Border Dressing Electronic Signature(s) Signed: 01/26/2020 5:57:30 PM By: Levan Hurst RN, BSN  Entered By: Levan Hurst on  01/26/2020 09:52:17 -------------------------------------------------------------------------------- Vitals Details Patient Name: Date of Service: UPTO [name] 01/26/2020 9:00 A M Medical Record Number: [id number] Patient Account Number: [id number] Date of Birth/Sex: Treating RN: 09-28-1940 (79 y.o. Female) Levan Hurst Primary Care Allure Greaser: Shon Baton Other Clinician: Referring Harris Kistler: Treating Fahim Kats/Extender: Laveda Norman, Ni Weeks in Treatment: 0 Vital Signs Time Taken: 09:24 Temperature (F): 98.5 Height (in): 59 Pulse (bpm): 71 Source: Stated Respiratory Rate (breaths/min): 16 Weight (lbs): 108 Blood Pressure (mmHg): 164/81 Source: Stated Reference Range: 80 - 120 mg / dl Body Mass Index (BMI): 21.8 Electronic Signature(s) Signed: 01/26/2020 5:57:30 PM By: Levan Hurst RN, BSN Entered By: Levan Hurst on 01/26/2020 09:24:51

## 2020-02-04 DIAGNOSIS — Z90710 Acquired absence of both cervix and uterus: Secondary | ICD-10-CM | POA: Diagnosis not present

## 2020-02-04 DIAGNOSIS — Z7901 Long term (current) use of anticoagulants: Secondary | ICD-10-CM | POA: Diagnosis not present

## 2020-02-04 DIAGNOSIS — S81802D Unspecified open wound, left lower leg, subsequent encounter: Secondary | ICD-10-CM | POA: Diagnosis not present

## 2020-02-04 DIAGNOSIS — R413 Other amnesia: Secondary | ICD-10-CM | POA: Diagnosis not present

## 2020-02-04 DIAGNOSIS — L03116 Cellulitis of left lower limb: Secondary | ICD-10-CM | POA: Diagnosis not present

## 2020-02-04 DIAGNOSIS — Z792 Long term (current) use of antibiotics: Secondary | ICD-10-CM | POA: Diagnosis not present

## 2020-02-04 DIAGNOSIS — D630 Anemia in neoplastic disease: Secondary | ICD-10-CM | POA: Diagnosis not present

## 2020-02-04 DIAGNOSIS — Z9181 History of falling: Secondary | ICD-10-CM | POA: Diagnosis not present

## 2020-02-04 DIAGNOSIS — C541 Malignant neoplasm of endometrium: Secondary | ICD-10-CM | POA: Diagnosis not present

## 2020-02-04 DIAGNOSIS — Z4800 Encounter for change or removal of nonsurgical wound dressing: Secondary | ICD-10-CM | POA: Diagnosis not present

## 2020-02-04 DIAGNOSIS — R600 Localized edema: Secondary | ICD-10-CM | POA: Diagnosis not present

## 2020-02-04 DIAGNOSIS — C7951 Secondary malignant neoplasm of bone: Secondary | ICD-10-CM | POA: Diagnosis not present

## 2020-02-04 DIAGNOSIS — R64 Cachexia: Secondary | ICD-10-CM | POA: Diagnosis not present

## 2020-02-04 DIAGNOSIS — E785 Hyperlipidemia, unspecified: Secondary | ICD-10-CM | POA: Diagnosis not present

## 2020-02-04 DIAGNOSIS — I700 Atherosclerosis of aorta: Secondary | ICD-10-CM | POA: Diagnosis not present

## 2020-02-04 DIAGNOSIS — K5790 Diverticulosis of intestine, part unspecified, without perforation or abscess without bleeding: Secondary | ICD-10-CM | POA: Diagnosis not present

## 2020-02-04 DIAGNOSIS — C3431 Malignant neoplasm of lower lobe, right bronchus or lung: Secondary | ICD-10-CM | POA: Diagnosis not present

## 2020-02-04 DIAGNOSIS — I10 Essential (primary) hypertension: Secondary | ICD-10-CM | POA: Diagnosis not present

## 2020-02-07 ENCOUNTER — Encounter: Payer: Self-pay | Admitting: Hematology and Oncology

## 2020-02-07 ENCOUNTER — Inpatient Hospital Stay: Payer: Medicare Other

## 2020-02-07 ENCOUNTER — Inpatient Hospital Stay: Payer: Medicare Other | Attending: Hematology and Oncology | Admitting: Hematology and Oncology

## 2020-02-07 ENCOUNTER — Other Ambulatory Visit: Payer: Self-pay

## 2020-02-07 VITALS — BP 209/94 | HR 69 | Temp 97.5°F | Resp 18 | Ht 59.0 in | Wt 99.1 lb

## 2020-02-07 DIAGNOSIS — Z885 Allergy status to narcotic agent status: Secondary | ICD-10-CM | POA: Diagnosis not present

## 2020-02-07 DIAGNOSIS — J320 Chronic maxillary sinusitis: Secondary | ICD-10-CM | POA: Diagnosis not present

## 2020-02-07 DIAGNOSIS — M419 Scoliosis, unspecified: Secondary | ICD-10-CM | POA: Insufficient documentation

## 2020-02-07 DIAGNOSIS — Z95828 Presence of other vascular implants and grafts: Secondary | ICD-10-CM

## 2020-02-07 DIAGNOSIS — I2510 Atherosclerotic heart disease of native coronary artery without angina pectoris: Secondary | ICD-10-CM | POA: Diagnosis not present

## 2020-02-07 DIAGNOSIS — Z7901 Long term (current) use of anticoagulants: Secondary | ICD-10-CM | POA: Diagnosis not present

## 2020-02-07 DIAGNOSIS — Z79899 Other long term (current) drug therapy: Secondary | ICD-10-CM | POA: Insufficient documentation

## 2020-02-07 DIAGNOSIS — Z9221 Personal history of antineoplastic chemotherapy: Secondary | ICD-10-CM | POA: Insufficient documentation

## 2020-02-07 DIAGNOSIS — D638 Anemia in other chronic diseases classified elsewhere: Secondary | ICD-10-CM | POA: Diagnosis not present

## 2020-02-07 DIAGNOSIS — C7951 Secondary malignant neoplasm of bone: Secondary | ICD-10-CM | POA: Insufficient documentation

## 2020-02-07 DIAGNOSIS — C541 Malignant neoplasm of endometrium: Secondary | ICD-10-CM | POA: Insufficient documentation

## 2020-02-07 DIAGNOSIS — I700 Atherosclerosis of aorta: Secondary | ICD-10-CM | POA: Diagnosis not present

## 2020-02-07 DIAGNOSIS — R64 Cachexia: Secondary | ICD-10-CM | POA: Insufficient documentation

## 2020-02-07 DIAGNOSIS — C3491 Malignant neoplasm of unspecified part of right bronchus or lung: Secondary | ICD-10-CM | POA: Diagnosis not present

## 2020-02-07 DIAGNOSIS — R809 Proteinuria, unspecified: Secondary | ICD-10-CM | POA: Insufficient documentation

## 2020-02-07 DIAGNOSIS — E039 Hypothyroidism, unspecified: Secondary | ICD-10-CM

## 2020-02-07 DIAGNOSIS — C3411 Malignant neoplasm of upper lobe, right bronchus or lung: Secondary | ICD-10-CM | POA: Diagnosis not present

## 2020-02-07 DIAGNOSIS — L97929 Non-pressure chronic ulcer of unspecified part of left lower leg with unspecified severity: Secondary | ICD-10-CM | POA: Diagnosis not present

## 2020-02-07 DIAGNOSIS — D61818 Other pancytopenia: Secondary | ICD-10-CM

## 2020-02-07 DIAGNOSIS — C779 Secondary and unspecified malignant neoplasm of lymph node, unspecified: Secondary | ICD-10-CM | POA: Diagnosis not present

## 2020-02-07 DIAGNOSIS — K5730 Diverticulosis of large intestine without perforation or abscess without bleeding: Secondary | ICD-10-CM | POA: Diagnosis not present

## 2020-02-07 DIAGNOSIS — Z853 Personal history of malignant neoplasm of breast: Secondary | ICD-10-CM

## 2020-02-07 DIAGNOSIS — I10 Essential (primary) hypertension: Secondary | ICD-10-CM | POA: Diagnosis not present

## 2020-02-07 DIAGNOSIS — C549 Malignant neoplasm of corpus uteri, unspecified: Secondary | ICD-10-CM

## 2020-02-07 DIAGNOSIS — Z923 Personal history of irradiation: Secondary | ICD-10-CM | POA: Diagnosis not present

## 2020-02-07 DIAGNOSIS — L03116 Cellulitis of left lower limb: Secondary | ICD-10-CM

## 2020-02-07 DIAGNOSIS — Z7189 Other specified counseling: Secondary | ICD-10-CM

## 2020-02-07 DIAGNOSIS — D701 Agranulocytosis secondary to cancer chemotherapy: Secondary | ICD-10-CM

## 2020-02-07 LAB — CMP (CANCER CENTER ONLY)
ALT: 29 U/L (ref 0–44)
AST: 36 U/L (ref 15–41)
Albumin: 3.2 g/dL — ABNORMAL LOW (ref 3.5–5.0)
Alkaline Phosphatase: 72 U/L (ref 38–126)
Anion gap: 10 (ref 5–15)
BUN: 26 mg/dL — ABNORMAL HIGH (ref 8–23)
CO2: 28 mmol/L (ref 22–32)
Calcium: 9.7 mg/dL (ref 8.9–10.3)
Chloride: 106 mmol/L (ref 98–111)
Creatinine: 0.81 mg/dL (ref 0.44–1.00)
GFR, Est AFR Am: >60 mL/min (ref >60)
GFR, Estimated: >60 mL/min (ref >60)
Glucose, Bld: 100 mg/dL — ABNORMAL HIGH (ref 70–99)
Potassium: 3.8 mmol/L (ref 3.5–5.1)
Sodium: 144 mmol/L (ref 135–145)
Total Bilirubin: 0.9 mg/dL (ref 0.3–1.2)
Total Protein: 7 g/dL (ref 6.5–8.1)

## 2020-02-07 LAB — CBC WITH DIFFERENTIAL (CANCER CENTER ONLY)
Abs Immature Granulocytes: 0.03 10*3/uL (ref 0.00–0.07)
Basophils Absolute: 0.1 10*3/uL (ref 0.0–0.1)
Basophils Relative: 1 %
Eosinophils Absolute: 0.1 10*3/uL (ref 0.0–0.5)
Eosinophils Relative: 1 %
HCT: 37.9 % (ref 36.0–46.0)
Hemoglobin: 11.5 g/dL — ABNORMAL LOW (ref 12.0–15.0)
Immature Granulocytes: 1 %
Lymphocytes Relative: 19 %
Lymphs Abs: 1.2 10*3/uL (ref 0.7–4.0)
MCH: 27.7 pg (ref 26.0–34.0)
MCHC: 30.3 g/dL (ref 30.0–36.0)
MCV: 91.3 fL (ref 80.0–100.0)
Monocytes Absolute: 0.7 10*3/uL (ref 0.1–1.0)
Monocytes Relative: 11 %
Neutro Abs: 4.5 10*3/uL (ref 1.7–7.7)
Neutrophils Relative %: 67 %
Platelet Count: 197 10*3/uL (ref 150–400)
RBC: 4.15 MIL/uL (ref 3.87–5.11)
RDW: 16.7 % — ABNORMAL HIGH (ref 11.5–15.5)
WBC Count: 6.6 10*3/uL (ref 4.0–10.5)
nRBC: 0 % (ref 0.0–0.2)

## 2020-02-07 LAB — T4, FREE: Free T4: 1.25 ng/dL — ABNORMAL HIGH (ref 0.61–1.12)

## 2020-02-07 LAB — SAMPLE TO BLOOD BANK

## 2020-02-07 LAB — TSH: TSH: <0.08 u[IU]/mL — ABNORMAL LOW (ref 0.308–3.960)

## 2020-02-07 LAB — TOTAL PROTEIN, URINE DIPSTICK: Protein, ur: 100 mg/dL — AB

## 2020-02-07 MED ORDER — HEPARIN SOD (PORK) LOCK FLUSH 100 UNIT/ML IV SOLN
500.0000 [IU] | Freq: Once | INTRAVENOUS | Status: DC
  Filled 2020-02-07: qty 5

## 2020-02-07 MED ORDER — SODIUM CHLORIDE 0.9% FLUSH
10.0000 mL | Freq: Once | INTRAVENOUS | Status: AC
  Administered 2020-02-07: 10 mL
  Filled 2020-02-07: qty 10

## 2020-02-07 NOTE — Progress Notes (Signed)
[place] OFFICE PROGRESS NOTE  Patient Care Team: Shon Baton, MD as PCP - General (Internal Medicine) Heath Lark, MD as Consulting Physician (Hematology and Oncology)  ASSESSMENT & PLAN:  Endometrial cancer [facility]) After a very prolonged episode of treatment break, the ulcer on the left lower extremity appears to be healing The patient is not vigilant about blood pressure management and her blood pressure is out of control despite not being on lenvatinib She also has heavy proteinuria Overall, I do not believe the patient can tolerate combination of pembrolizumab with lenvatinib I will discontinue this treatment permanently I plan to see her again next month for further follow-up and repeat PET CT scan to assess disease status and to consider restarting her back on other treatment if needed  Malignant cachexia ([place]) She has profound weight loss since last time I saw her Continue supportive care for now  Anemia, chronic disease After recent blood transfusion, her hemoglobin has improved Observe for now  Essential hypertension Not sentShe has poorly controlled hypertension Despite her blood pressure being very high at home, she did not seek medical attention Medic from high blood pressure today I will alert her primary care doctor I recommend she continues close blood pressure monitoring and to call her primary care doctor for urgent evaluation and medication adjustment   Orders Placed This Encounter  Procedures  . NM PET Image Restage (PS) Skull Base to Thigh    Standing Status:   Future    Standing Expiration Date:   02/06/2021    Order Specific Question:   If indicated for the ordered procedure, I authorize the administration of a radiopharmaceutical per Radiology protocol    Answer:   Yes    Order Specific Question:   Preferred imaging location?    Answer:   [facility]    Order Specific Question:   Radiology Contrast Protocol - do NOT remove file  path    Answer:   [email or web address]    All questions were answered. The patient knows to call the clinic with any problems, questions or concerns. The total time spent in the appointment was 30 minutes encounter with patients including review of chart and various tests results, discussions about plan of care and coordination of care plan   Heath Lark, MD 02/07/2020 1:50 PM  INTERVAL HISTORY: Please see below for problem oriented charting. She returns with her husband for further follow-up Since her last time I saw her, she had received wound care According to the patient, her wound is healing She has a bandage over it No bleeding or discharge from the wound Advanced home care service has been notifying me of their inability to get hold of the patient over the last few weeks The patient felt overall she does not need advanced home care nursing staff to come and visit her anymore Over the last few weeks since she was last seen, her blood pressure medication was changed It was felt that the amlodipine might be causing her leg swelling and that was stopped She is taking her diuretic therapy instead Her blood pressure has been running high over the last few weeks According to the patient, her systolic blood pressure is always around 160 Because of her upcoming appointment to see her primary care doctor, she did not make an appointment to be seen a medication to be adjusted Today, her blood pressure is very high.  She denies blurriness of vision, headaches or dizziness She has lost a lot of  weight since last time she was seen  SUMMARY OF ONCOLOGIC HISTORY: Oncology History Overview Note  Biopsy of recurrence 06-2014 ER negative. PDL1 testing low at 2% on the uterine cancer She progressed on carboplatin and Paclitaxel for uterine cancer HER 2 negative on pathology [id number] Negative genetic testing  On her lung cancer sample, [employer] [facility] testing  revealed 10% PD-1 positive EGFR mutation was detected, exon 19 deletion   Endometrial cancer ([place])  12/01/2007 Initial Diagnosis   She has recurrent papillary serous endometrial carcinoma. Briefly she was diagnosed in 2009 with a FIGO IB pappilary serous carcinoma treated with hysterectomy followed by adjuvant chemo and vaginal brachytherapy. She then had a recurrence diagnosed in late 2012 that was deemed not to be surgically resectable, and was treated with 6 cycles of carbo/taxol followed by 5040 cGy of EBRT to the pelvic mass. She was then followed with serial imaging and was noted in June of this year to have slight increase in the size of the mass, and again in September there was small enlargement of the mass. She had a re-staging PET scan on 03/10/13 that showed FDG activity in the pelvic mass, with no other areas of disease   12/01/2007 Imaging   CT scan of chest, abdomen and pelvis: 1.  Mass like area of decreased attenuation in the central uterus, consistent with the known endometrial carcinoma.  Deep myometrial invasion is suspected.  Pelvic MRI without and with contrast could be performed for further staging workup if clinically warranted. 2.  No evidence of extrauterine extension or lymphadenopathy. 3.  Sigmoid diverticulosis incidentally noted.   12/15/2007 Surgery   --12/2007 laparoscopic hysterectomy and PLND --05/2008 complted 6 cycles adjuvant carbo/taxol followed by vaginal cuff brachy --03/2011 exploratory lararoscopy, ureterolysis --4/13 completed 6 cycels carbotaxol --8/13 completed EBRT to the left pelvic sidewall    06/10/2008 Imaging   CT scan abdomen and pelvis 1.  Nonspecific mildly prominent inguinal lymph nodes, right greater than left. 2.  Interval hysterectomy without evidence of recurrent pelvic mass or fluid collection.   03/22/2011 Imaging   Ct scan abdomen and pelvis 1.  Local uterine cancer recurrence with two large nodes along the left pelvic sidewall. 2.   No evidence of bowel obstruction, urinary obstruction, or more distant metastasis.   03/31/2011 Relapse/Recurrence   chemotherapy and external beam radiation   05/16/2011 - 09/17/2011 Chemotherapy   She had 6 cycles of carboplatin and Taxol    07/17/2011 Imaging   Ct scan abdomen and pelvis 1.  Interval improvement in the previously demonstrated left pelvic local recurrence of tumor. 2.  No disease progression or complication identified. 3.  Mild bladder wall thickening on the right, nonspecific and possibly related to incomplete distension and/or radiation therapy. 4.  Cholelithiasis   10/11/2011 Imaging   CT scan abdomen and pelvis 1.  Interval enlargement of the left pelvic sidewall masses. 2.  No new nodular disease in the pelvis or lymphadenopathy. 3.  No evidence  of distant metastasis. 4.  Mild hydronephrosis on the right is similar to prior. 5.  Focal thickening of the right aspect the bladder is stable compared to prior.    10/28/2011 - 12/05/2011 Radiation Therapy   radiation for pelvic sidewall recurrence   10/28/2011 - 12/05/2011 Radiation Therapy   10/28/11-12/05/11: Radiotherapy to the left pelvic sidewall   03/18/2012 Imaging   CT abdomen 1.  Today's study demonstrates a positive response to therapy with decreased size of left pelvic sidewall nodal masses, as detailed above.  No new soft tissue masses or new lymphadenopathy is identified within the abdomen or pelvis. Continue attention on follow-up studies is recommended. 2.  Cholelithiasis without findings to suggest acute cholecystitis. 3.  Colonic diverticulosis without findings to suggest acute diverticulitis at this time. 4.  Mild asymmetric urinary bladder wall thickening is unchanged compared to prior examinations and is nonspecific.  No definite bladder wall mass is identified at this time. 5.  Additional findings, similar to prior examinations, as above   06/22/2012 Imaging   CT abdomen 1.  Further decreased size  of the left pelvic peripherally enhancing lesion. 2.  No new sites of new or progressive disease. 3. Esophageal air fluid level suggests dysmotility or gastroesophageal reflux. 4.  Cholelithiasis   11/02/2012 Imaging   CT abdomen 1.  The left pelvic side wall lesion demonstrates mild increase in size from previous exam. 2.  No new sites of new or progressive disease. 3.  Cholelithiasis.    01/28/2013 Imaging   CT abdomen 1. Interval small increase in volume of enhancing mass adjacent to the left pelvic sidewall. 2. No evidence of abdominal or pelvic lymphadenopathy   04/02/2013 Surgery   pelvic mass resection with IORT   04/02/2013 - 04/02/2013 Radiation Therapy   04/02/13: Intraoperative radiotherapy to the left pelvis   05/10/2013 PET scan   1. Hypermetabolic mass in the deep left pelvis consistent with metastasis. 2. No additional evidence of local metastasis. No evidence of distant metastasis. 3. Small right lower lobe pulmonary nodule is not hypermetabolic.   07/27/2013 Imaging   No evidence of recurrent or metastatic disease. Apparent prior resection of the deep left pelvic metastasis.   02/08/2014 Imaging   No CT findings for recurrent or metastatic disease involving the abdomen/ pelvis   06/13/2014 Relapse/Recurrence   + recurrence paraspinous muscle   06/21/2014 Procedure   There is a soft tissue lesion along the left side of L3-L4. This lesion roughly measures up to 2.2 cm. Needle was positioned along the posterior aspect of the lesion. Small amount of air in the paraspinal tissues following needle removal   06/21/2014 Pathology Results   Bone, biopsy, left lumbar paraspinal - METASTATIC POORLY DIFFERENTIATED CARCINOMA, CONSISTENT WITH HIGH GRADE SEROUS CARCINOMA SEE COMMENT. Microscopic Comment The carcinoma demonstrates the following immunophenotype: Cytokeratin 7 - patchy moderate to strong expression Estrogen receptor - negative expression P53 - strong diffuse  expression TTF-1 - negative expression WT-1 - negative expression CD56 - focal moderate strong expression Synaptophysin - negative expression GCDFP - negative expression The history of primary endometrial papillary serous carcinoma and primary mammary carcinoma is noted. In the current case, the overall morphologic and immunophenotype are that of poorly differentiated carcinoma, consistent with high grade serous carcinoma.    07/11/2014 - 08/12/2014 Radiation Therapy   07/11/14-08/12/14: 55 Gy in 25 fractions to the Lumbar spine   08/15/2014 Imaging   CT scan of chest, abdomen and pelvis 1. Since the biopsy study of 06/21/2014, similar to slight decrease in size of a left paraspinous lesion at L3-4. 2. No new sites of metastatic disease identified. 3. 8 mm ground-glass nodule in the right lower lobe is grossly similar to 03/10/2013, suggesting a benign etiology. 4. Cholelithiasis. 5. Apparent sigmoid colonic wall thickening which could be due to underdistention. Colitis felt less likely. 6.  Atherosclerosis, including within the coronary arteries. 7. Pelvic floor laxity.   10/07/2014 Imaging   CT scan of chest, abdomen and pelvis: Stable to slight interval decrease in size of left  paraspinous lesion at L3-4. No new sites of metastatic disease. Unchanged ground-glass nodule within the right lower lobe, potentially benign in etiology. Recommend attention on followup. Cholelithiasis. Sigmoid colonic diverticulosis. No CT evidence to suggest acute diverticulitis.   01/19/2015 Imaging   Ct scan of abdomen and pelvis 1. Decrease in size of left paraspinous metastasis. 2. No new sites of disease. 3. Stable ground-glass attenuating nodule within the superior segment of right lower lobe. 4. Gallstones   05/02/2015 Imaging   Ct abdomen and pelvis: Slight interval increase in size of left lumbar paraspinal soft tissue metastasis. No new sites of metastatic disease identified within the abdomen or  pelvis.    05/11/2015 PET scan   1. The small soft tissue mass just lateral to the left L3 for neural foramen is hypermetabolic, favoring malignancy. 2. There is also a small focus of hypermetabolic activity between the left L1 and L2 transverse processes, but without a CT correlate. 3. The 13 mm sub solid nodule in the superior segment right lower lobe is stable from recent exams but has slowly increased in size over the last 7 years. Although not hypermetabolic, the appearance is concerning for the possibility of a low grade adenocarcinoma. 4. Other imaging findings of potential clinical significance: Chronic bilateral maxillary sinusitis. Coronary, aortic arch, and branch vessel atherosclerotic vascular disease. Aortoiliac atherosclerotic vascular disease. Cholelithiasis. Sigmoid colon diverticulosis. Lumbar scoliosis.   07/11/2015 Imaging   Ct scan of chest, abdomen and pelvis: 1. 13 mm mixed solid and sub solid nodule in the posterior right lower lobe was present in 2009 and has clearly progressed in the interval since that study. Imaging features remain highly concerning for low-grade or well differentiated adenocarcinoma. 2. Slight increase size of in the hypermetabolic, rim enhancing nodule identified adjacent to the left L3-4 neural foramen. Metastatic disease remains a concern. 3. No evidence for discrete soft tissue lesion between the left L1 and 2 transverse processes, the site of focal FDG uptake on the recent PET-CT. 4. Cholelithiasis. 5. Abdominal aortic atherosclerosis   08/03/2015 - 01/16/2016 Chemotherapy   She received carboplatin and Taxol   11/06/2015 PET scan   1. Hypermetabolic left N0-2 paraspinous metastasis (biopsy-proven) is stable in size and slightly decreased in metabolism. 2. Previously described small focus of hypermetabolism between the left L2 and L3 spinous processes has resolved. 3. New mild linear hypermetabolism to the left of the T11-12 spinous processes  without discrete mass on the CT images, favor benign activity related activity, recommend attention on follow-up PET-CT.  4. No definite new sites of hypermetabolic metastatic disease. No recurrent hypermetabolic metastatic disease in the pelvis. 5. Interval stability of subsolid 1.3 cm superior segment right lower lobe pulmonary nodule without associated significant metabolism, which has grown compared to the 2009 chest CT study, and remain suspicious for low grade adenocarcinoma. 6. Additional findings include aortic atherosclerosis, coronary atherosclerosis, mild multinodular goiter with no hypermetabolic thyroid nodules, cholelithiasis and moderate sigmoid diverticulosis.   02/12/2016 PET scan   1. Interval increase in size and metabolic activity of the LEFT paraspinal soft tissue metastasis. 2. New activity within the musculature of the LEFT chest wall. Given the unusual location of the paraspinal metastasis cannot exclude a second soft tissue metastasis to the musculature however favor benign physiologic activity. 3. Stable RIGHT lower lobe pulmonary nodule. 4. No evidence of local recurrence.     04/08/2016 Imaging   Ct scan of chest, abdomen and pelvis: 1. Similar size of a  left paravertebral abdominal lesion.  2. No new or progressive metastatic disease. 3. No correlate for the muscular activity about the lateral left chest wall. 4.  Coronary artery atherosclerosis. Aortic atherosclerosis. 5. Cholelithiasis. 6. Similar right lower lobe pulmonary nodule.   06/03/2016 Imaging   CT abdomen and pelvis 1. Similar to mild enlargement of a paravertebral soft tissue lesion at the L3-4 level. 2. No new sites of disease identified. 3. Cholelithiasis. 4. Hysterectomy. 5.  Aortic atherosclerosis.    09/02/2016 Imaging   CT abdomen and pelvis 1. Continue mild interval increase in size of LEFT paraspinal mass (1-2 mm). 2. No evidence of new metastatic disease in the abdomen pelvis. 3.  Post hysterectomy anatomy   09/26/2016 Imaging   MR lumbar spine 1. Left paraspinal metastasis with epicenter adjacent to the left L3 neural foramen does extend into the foramen to the level of the left lateral epidural space (series 9, image 31), but also tracks cephalad and caudal along the L2-L4 lumbar plexus (series 10, image 15). No extension into the left L2 or L4 neural foramina. And no more distal extension of tumor. 2. Abnormal signal in the medial left psoas and ventral left erector spinae muscles at L2 and L3 is probably denervation related. 3. No bone invasion or osseous metastatic disease. Suspect previous radiation of the L2 through L5 spinal levels. 4. No dural or intradural metastatic disease.    10/22/2016 Procedure   She underwent stereotactic Radiosurgery   10/30/2016 Genetic Testing   Patient has genetic testing done for Inheritable genetic mutation panel. Results revealed patient has no actionable mutation.   01/21/2017 Imaging   1. Reduced size and conspicuity of the left paraspinal mass at the L3-4 level. The mass still present but has enhancement similar to that of adjacent psoas musculature. 2. No new metastatic disease is identified. 3. Other imaging findings of potential clinical significance: Cholelithiasis. Aortic Atherosclerosis (ICD10-I70.0). Sigmoid diverticulosis. Lumbar scoliosis.   05/09/2017 Imaging   Ct scan of abdomen and pelvis 1. Paraspinal mass adjacent to the left side of L3-L4 is less distinct than prior examinations, and is therefore difficult to discretely measure, however, the overall appearance suggests continued positive response to therapy. 2. No new signs of metastatic disease elsewhere in the abdomen or pelvis. 3. Aortic atherosclerosis, in addition to at least right coronary artery disease. Assessment for potential risk factor modification, dietary therapy or pharmacologic therapy may be warranted, if clinically indicated. 4. Colonic  diverticulosis without evidence of acute diverticulitis at this time. 5. Additional incidental findings, as above. Aortic Atherosclerosis (ICD10-I70.0).   08/14/2017 Imaging   1. Treated left paraspinal mass centered at L3-4. Mass size is stable but there has been some evolution of tumor characteristics with less central fluid seen today. Recommend continued surveillance. 2. No evidence of untreated metastasis. 3. Degenerative changes and related impingement are described above.   10/28/2017 Imaging   Stable small left paraspinal soft tissue mass. No new or progressive disease within the abdomen or pelvis.  Cholelithiasis.  No radiographic evidence of cholecystitis.  Colonic diverticulosis, without radiographic evidence of diverticulitis.   04/30/2018 Imaging   Stable post treatment change of the partially necrotic LEFT paravertebral mass at L3-L4. 18 x 21 mm cross-section. Mass effect on the LEFT L3 nerve root redemonstrated. Enhancing and atrophic LEFT psoas is inseparable.   10/15/2018 Imaging   1. Stable post treatment size and appearance of partially necrotic left paravertebral mass at L3-4, measuring 19 x 25 mm on current exam (unchanged when measured at similar level  on previous study). Mass effect on the adjacent left L3 nerve root is unchanged. Adjacent enhancing and atrophic left psoas muscle. 2. Left convex scoliosis with associated multilevel degenerative spondylolysis and facet arthrosis, unchanged   10/29/2018 Imaging   Stable small left L3 paraspinal soft tissue mass. No evidence of new or progressive metastatic disease, or other acute findings.   Cholelithiasis.  No radiographic evidence of cholecystitis.   Colonic diverticulosis, without radiographic evidence of diverticulitis.   11/11/2018 PET scan   1. Low level metabolism (max SUV 2.0) associated with the irregular subsolid superior segment right lower lobe 2.1 cm pulmonary nodule. As better depicted on the recent  diagnostic chest CT study, this nodule crosses the major fissure into the right upper lobe and has increased in size and density on multiple imaging studies back to 2009. Findings are most suggestive of primary bronchogenic adenocarcinoma. 2. No hypermetabolic thoracic adenopathy. 3. Persistent hypermetabolism (max SUV 8.0) associated with the known left L3-4 paraspinous metastasis, mildly decreased in metabolism since 02/12/2016 PET-CT. 4. No additional sites of hypermetabolic metastatic disease in the abdomen or pelvis. No metabolic evidence of peritoneal recurrence. No ascites. 5. Chronic findings include: Aortic Atherosclerosis (ICD10-I70.0). Coronary atherosclerosis. Chronic bilateral maxillary sinusitis. Cholelithiasis. Moderate sigmoid diverticulosis.   01/07/2019 - 04/04/2019 Chemotherapy   The patient had carboplatin and Alimta for chemotherapy treatment.     05/03/2019 -  Chemotherapy   The patient had Pembrolizumab and Lenvima for chemotherapy treatment.     07/23/2019 PET scan   1. 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 No new sites of hypermetabolic metastatic disease on today's study. 2. Stable appearance of surgical changes in the right lung without features to suggest local recurrence or metastatic lung cancer. 3. Cholelithiasis. 4.  Aortic Atherosclerois (ICD10-170.0)   10/20/2019 Imaging   Bilateral venous Doppler US RIGHT:  - Findings consistent with acute and occlusive deep vein thrombosis involving the right common femoral vein, right external iliac vein, right femoral vein, right popliteal vein, right posterior tibial veins, and right peroneal veins.   Unable to adequately visualize the common iliac veins due to bowel gas. The imaged portions of the IVC were patent.     LEFT:  - No evidence of common femoral vein obstruction.    11/08/2019 PET scan   1. Further reduction in activity at the left L3 level and  left paraspinal tissues at L3. Maximum vertebral SUV currently 3.9, previously 4.8 on 07/23/2019 and previously 10.0 on 04/14/2019. 2. Late phase healing of previous left transverse process fractures at L3 and L4. Suspected healing right sixth rib fracture anteriorly. 3. There is new focal activity in the vicinity of the right internal jugular vein in the lower neck. Maximum SUV in this vicinity is 5.1. I not see a well-defined lymph node are thyroid lesion to corroborate with the focus, but surveillance in this region is suggested. 4. Other imaging findings of potential clinical significance: Aortic Atherosclerosis (ICD10-I70.0). Coronary atherosclerosis. Cholelithiasis. Sigmoid colon diverticulosis.   Metastatic cancer to bone ([place])  06/27/2014 Initial Diagnosis   Metastatic cancer to bone   Primary lung cancer, right ([place])  11/04/2018 Initial Diagnosis   Primary lung cancer, right ([place])   11/11/2018 PET scan   1. Low level metabolism (max SUV 2.0) associated with the irregular subsolid superior segment right lower lobe 2.1 cm pulmonary nodule. As better depicted on the recent diagnostic chest CT study, this nodule crosses the major fissure into the right  upper lobe and has increased in size and density on multiple imaging studies back to 2009. Findings are most suggestive of primary bronchogenic adenocarcinoma. 2. No hypermetabolic thoracic adenopathy. 3. Persistent hypermetabolism (max SUV 8.0) associated with the known left L3-4 paraspinous metastasis, mildly decreased in metabolism since 02/12/2016 PET-CT. 4. No additional sites of hypermetabolic metastatic disease in the abdomen or pelvis. No metabolic evidence of peritoneal recurrence. No ascites. 5. Chronic findings include: Aortic Atherosclerosis (ICD10-I70.0). Coronary atherosclerosis. Chronic bilateral maxillary sinusitis. Cholelithiasis. Moderate sigmoid diverticulosis.   12/07/2018 Pathology Results   1. Lung, resection (segmental or  lobe), Right Lobe Superior with endblock wedge of right upper lobe - INVASIVE ADENOCARCINOMA, MODERATELY DIFFERENTIATED, SPANNING 1.6 CM. - THE SURGICAL RESECTION MARGINS ARE NEGATIVE FOR CARCINOMA. - SEE ONCOLOGY TABLE BELOW. 2. Lymph node, biopsy, Level 9 #1 - THERE IS NO EVIDENCE OF CARCINOMA IN 1 OF 1 LYMPH NODE (0/1). 3. Lymph node, biopsy, Level 9 #2 - THERE IS NO EVIDENCE OF CARCINOMA IN 1 OF 1 LYMPH NODE (0/1). 4. Lymph node, biopsy, Level 7 #1 - METASTATIC CARCINOMA IN 1 OF 1 LYMPH NODE (1/1). 5. Lymph node, biopsy, Level 7 #2 - THERE IS NO EVIDENCE OF CARCINOMA IN 1 OF 1 LYMPH NODE (0/1). 6. Lymph node, biopsy, Level 12 #1 - THERE IS NO EVIDENCE OF CARCINOMA IN 1 OF 1 LYMPH NODE (0/1). 7. Lymph node, biopsy, Level 12 #2 - THERE IS NO EVIDENCE OF CARCINOMA IN 1 OF 1 LYMPH NODE (0/1). 8. Lymph node, biopsy, Level 10 #1 - THERE IS NO EVIDENCE OF CARCINOMA IN 1 OF 1 LYMPH NODE (0/1). 9. Lymph node, biopsy, Level 4R #1 - THERE IS NO EVIDENCE OF CARCINOMA IN 1 OF 1 LYMPH NODE (0/1). 10. Lymph node, biopsy, Level 4R #2 - THERE IS NO EVIDENCE OF CARCINOMA IN 1 OF 1 LYMPH NODE (0/1). 11. Lymph node, biopsy, Level 2R #1 - THERE IS NO EVIDENCE OF CARCINOMA IN 1 OF 1 LYMPH NODE (0/1). 12. Lung, resection (segmental or lobe), Right Lobe Superior - BENIGN LUNG PARENCHYMA. - THERE IS NO EVIDENCE OF MALIGNANCY. Procedure: Segmentectomy. Specimen Laterality: Right. Tumor Site: Right superior lobe. Tumor Size: 1.6 cm (gross measurement). Tumor Focality: Unifocal. Histologic Type: Adenocarcinoma, moderately differentiated. Visceral Pleura Invasion: Not identified. Lymphovascular Invasion: Not identified. Direct Invasion of Adjacent Structures: Not identified. Margins: Negative for carcinoma. Treatment Effect: N/A Regional Lymph Nodes: Number of Lymph Nodes Involved: 1 (level 7) Number of Lymph Nodes Examined: 10 Pathologic Stage Classification (pTNM, AJCC 8th Edition): pT1b,  pN2 Ancillary Studies: The tumor cells are positive for Napsin-A, TTF-1, and p53. They are essentially negative for estrogen receptor, PAX-8 and progesterone receptor. Additional studies can be performed upon clinician request. Representative Tumor Block: 1D-1E. (JBK:gt, 12/09/18)   12/07/2018 Surgery   PREOPERATIVE DIAGNOSIS:  Right lung nodule.   POSTOPERATIVE DIAGNOSIS:  Non-small cell carcinoma- suspected primary lung carcinoma, clinical stage T2N01b.   PROCEDURE:   Right video-assisted thoracoscopy, Right lower lobe superior segmentectomy with en bloc wedge resection of right upper lobe, Lymph node dissection, Intercostal nerve block levels 3-9.   SURGEON:  Charlett Lango, MD   FINDINGS:  Mass palpable in posterior aspect of superior segment of right lower lobe, likely involvement across fissure into the upper lobe.  Frozen section revealed non-small cell carcinoma. upper and lower lobe margins clear.   12/23/2018 Cancer Staging   Staging form: Lung, AJCC 8th Edition - Pathologic: Stage IIIA (pT1b, pN2, cM0) - Signed by Artis Delay, MD on 12/23/2018  01/05/2019 Imaging   MRI brain 1. No metastatic disease or acute intracranial abnormality identified. 2. Small chronic parafalcine meningioma size is stable since that described in 2006 (12 x 15 mm). 3. Advanced signal changes in the cerebral white matter and to a lesser extent pons, nonspecific but most commonly due to chronic small vessel disease. 4. Partially visible degenerative cervical spinal stenosis.     01/07/2019 -  Chemotherapy   The patient had carboplatin and Alimta for chemotherapy treatment.     04/14/2019 PET scan   1. 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 As shown on recent MRI of 04/01/2019, there is a new component of invasion into the left lower L3 vertebral body. This new component has a maximum SUV of 10.0, compatible with active malignancy. 2. Interval wedge resection of  portions of the right upper lobe and right lower lobe at the site of the prior nodule. There is only minimal low-grade activity along the wedge resection clips, maximum SUV of 1.5. Surveillance is likely warranted. No new nodule identified. 3. Other imaging findings of potential clinical significance: Aortic Atherosclerosis (ICD10-I70.0). Coronary atherosclerosis. Thoracolumbar scoliosis. Cholelithiasis. Sigmoid colon diverticulosis.     05/03/2019 -  Chemotherapy   The patient had Pembrolizumab and Lenvima for chemotherapy treatment.     05/22/2019 Imaging   Ct head Atrophy, chronic microvascular disease.   No acute intracranial abnormality.       REVIEW OF SYSTEMS:   Constitutional: Denies fevers, chills or abnormal weight loss Eyes: Denies blurriness of vision Ears, nose, mouth, throat, and face: Denies mucositis or sore throat Respiratory: Denies cough, dyspnea or wheezes Cardiovascular: Denies palpitation, chest discomfort or lower extremity swelling Gastrointestinal:  Denies nausea, heartburn or change in bowel habits Skin: Denies abnormal skin rashes Lymphatics: Denies new lymphadenopathy or easy bruising Neurological:Denies numbness, tingling or new weaknesses Behavioral/Psych: Mood is stable, no new changes  All other systems were reviewed with the patient and are negative.  I have reviewed the past medical history, past surgical history, social history and family history with the patient and they are unchanged from previous note.  ALLERGIES:  is allergic to ace inhibitors, dilaudid [hydromorphone hcl], and codeine.  MEDICATIONS:  Current Outpatient Medications  Medication Sig Dispense Refill  . atenolol (TENORMIN) 25 MG tablet Take 25 mg by mouth 2 (two) times daily.     . furosemide (LASIX) 20 MG tablet Take 1 tablet (20 mg total) by mouth daily. 10 tablet 0  . HYDROcodone-acetaminophen (NORCO/VICODIN) 5-325 MG tablet Take 1 tablet by mouth every 4 (four) hours as  needed. 12 tablet 0  . lidocaine-prilocaine (EMLA) cream Apply to [facility] cath 1-2 hours prior to access as directed 30 g 1  . LORazepam (ATIVAN) 0.5 MG tablet 1 tablet po 30 minutes prior to radiation or MRI 30 tablet 0  . memantine (NAMENDA) 5 MG tablet TAKE 1 TABLET DAILY FOR ONE WEEK, THEN TAKE 1 TABLET TWICE DAILY FOR ONE WEEK, THEN TAKE 1 TABLET IN THE MORNING AND 2 IN THE EVENING FOR ONE WEEK, THEN TAKE 2 TABLETS TWICE DAILY 210 tablet 1  . mirtazapine (REMERON) 7.5 MG tablet TAKE 1 TABLET (7.5 MG TOTAL) BY MOUTH AT BEDTIME. 90 tablet 2  . Multiple Vitamin (MULTIVITAMIN WITH MINERALS) TABS tablet Take 1 tablet by mouth daily.    . potassium chloride (KLOR-CON) 10 MEQ tablet Take 1 tablet (10 mEq total) by mouth daily. 10 tablet 0  . rivaroxaban (XARELTO) 10 MG TABS tablet Take  1 tablet (10 mg total) by mouth daily. 30 tablet 11  . simvastatin (ZOCOR) 20 MG tablet Take 20 mg by mouth every evening.      No current facility-administered medications for this visit.   Facility-Administered Medications Ordered in Other Visits  Medication Dose Route Frequency Provider Last Rate Last Admin  . heparin lock flush 100 unit/mL  500 Units Intracatheter Once Larkin Morelos, MD      . sodium chloride flush (NS) 0.9 % injection 10 mL  10 mL Intracatheter Once Alvy Bimler, Sydnie Sigmund, MD        PHYSICAL EXAMINATION: ECOG PERFORMANCE STATUS: 2 - Symptomatic, <50% confined to bed  Vitals:   02/07/20 1144  BP: (!) 209/94  Pulse: 69  Resp: 18  Temp: (!) 97.5 F (36.4 C)  SpO2: 99%   Filed Weights   02/07/20 1144  Weight: 99 lb 1.6 oz (45 kg)    GENERAL:alert, no distress and comfortable.  She looks thin and cachectic SKIN: Her leg ulcer is improving, dry with clean scab over it I remove her old bandage in place a new bandage over it Musculoskeletal:no cyanosis of digits and no clubbing  NEURO: alert & oriented x 3 with fluent speech, no focal motor/sensory deficits  LABORATORY DATA:  I have reviewed the  data as listed    Component Value Date/Time   NA 144 02/07/2020 1109   NA 140 05/09/2017 0844   K 3.8 02/07/2020 1109   K 4.1 05/09/2017 0844   CL 106 02/07/2020 1109   CO2 28 02/07/2020 1109   CO2 26 05/09/2017 0844   GLUCOSE 100 (H) 02/07/2020 1109   GLUCOSE 82 05/09/2017 0844   BUN 26 (H) 02/07/2020 1109   BUN 20.7 05/09/2017 0844   CREATININE 0.81 02/07/2020 1109   CREATININE 0.7 05/09/2017 0844   CALCIUM 9.7 02/07/2020 1109   CALCIUM 9.3 05/09/2017 0844   PROT 7.0 02/07/2020 1109   PROT 6.8 05/09/2017 0844   ALBUMIN 3.2 (L) 02/07/2020 1109   ALBUMIN 3.6 05/09/2017 0844   AST 36 02/07/2020 1109   AST 27 05/09/2017 0844   ALT 29 02/07/2020 1109   ALT 17 05/09/2017 0844   ALKPHOS 72 02/07/2020 1109   ALKPHOS 43 05/09/2017 0844   BILITOT 0.9 02/07/2020 1109   BILITOT 0.81 05/09/2017 0844   GFRNONAA >60 02/07/2020 1109   GFRAA >60 02/07/2020 1109    No results found for: SPEP, UPEP  Lab Results  Component Value Date   WBC 6.6 02/07/2020   NEUTROABS 4.5 02/07/2020   HGB 11.5 (L) 02/07/2020   HCT 37.9 02/07/2020   MCV 91.3 02/07/2020   PLT 197 02/07/2020      Chemistry      Component Value Date/Time   NA 144 02/07/2020 1109   NA 140 05/09/2017 0844   K 3.8 02/07/2020 1109   K 4.1 05/09/2017 0844   CL 106 02/07/2020 1109   CO2 28 02/07/2020 1109   CO2 26 05/09/2017 0844   BUN 26 (H) 02/07/2020 1109   BUN 20.7 05/09/2017 0844   CREATININE 0.81 02/07/2020 1109   CREATININE 0.7 05/09/2017 0844      Component Value Date/Time   CALCIUM 9.7 02/07/2020 1109   CALCIUM 9.3 05/09/2017 0844   ALKPHOS 72 02/07/2020 1109   ALKPHOS 43 05/09/2017 0844   AST 36 02/07/2020 1109   AST 27 05/09/2017 0844   ALT 29 02/07/2020 1109   ALT 17 05/09/2017 0844   BILITOT 0.9 02/07/2020 1109  BILITOT 0.81 05/09/2017 0844

## 2020-02-07 NOTE — Assessment & Plan Note (Signed)
After a very prolonged episode of treatment break, the ulcer on the left lower extremity appears to be healing The patient is not vigilant about blood pressure management and her blood pressure is out of control despite not being on lenvatinib She also has heavy proteinuria Overall, I do not believe the patient can tolerate combination of pembrolizumab with lenvatinib I will discontinue this treatment permanently I plan to see her again next month for further follow-up and repeat PET CT scan to assess disease status and to consider restarting her back on other treatment if needed

## 2020-02-07 NOTE — Assessment & Plan Note (Signed)
Not sentShe has poorly controlled hypertension Despite her blood pressure being very high at home, she did not seek medical attention Medic from high blood pressure today I will alert her primary care doctor I recommend she continues close blood pressure monitoring and to call her primary care doctor for urgent evaluation and medication adjustment

## 2020-02-07 NOTE — Patient Instructions (Signed)

## 2020-02-07 NOTE — Assessment & Plan Note (Signed)
After recent blood transfusion, her hemoglobin has improved Observe for now

## 2020-02-07 NOTE — Assessment & Plan Note (Signed)
She has profound weight loss since last time I saw her Continue supportive care for now

## 2020-02-09 ENCOUNTER — Other Ambulatory Visit: Payer: Self-pay

## 2020-02-09 ENCOUNTER — Encounter (HOSPITAL_BASED_OUTPATIENT_CLINIC_OR_DEPARTMENT_OTHER): Payer: Medicare Other | Admitting: Physician Assistant

## 2020-02-09 DIAGNOSIS — Z86718 Personal history of other venous thrombosis and embolism: Secondary | ICD-10-CM | POA: Diagnosis not present

## 2020-02-09 DIAGNOSIS — I10 Essential (primary) hypertension: Secondary | ICD-10-CM | POA: Diagnosis not present

## 2020-02-09 DIAGNOSIS — F0150 Vascular dementia without behavioral disturbance: Secondary | ICD-10-CM | POA: Diagnosis not present

## 2020-02-09 DIAGNOSIS — Z853 Personal history of malignant neoplasm of breast: Secondary | ICD-10-CM | POA: Diagnosis not present

## 2020-02-09 DIAGNOSIS — L97822 Non-pressure chronic ulcer of other part of left lower leg with fat layer exposed: Secondary | ICD-10-CM | POA: Diagnosis not present

## 2020-02-09 DIAGNOSIS — I872 Venous insufficiency (chronic) (peripheral): Secondary | ICD-10-CM | POA: Diagnosis not present

## 2020-02-09 NOTE — Progress Notes (Addendum)
Kelly, Sandoval ([id number]) Visit Report for 02/09/2020 Chief Complaint Document Details Patient Name: Date of Service: UPTO [name] 02/09/2020 1:00 PM Medical Record Number: [id number] Patient Account Number: [id number] Date of Birth/Sex: Treating RN: Aug 05, 1940 (79 y.o. [name] Primary Care Provider: Shon Baton Other Clinician: Referring Provider: Treating Provider/Extender: Lowella Fairy in Treatment: 2 Information Obtained from: Patient Chief Complaint Left LE Ulcer Electronic Signature(s) Signed: 02/09/2020 1:19:04 PM By: [name] PA-C Entered By: [name] on 02/09/2020 13:19:04 -------------------------------------------------------------------------------- HPI Details Patient Name: Date of Service: UPTO Kelly Fila B. 02/09/2020 1:00 PM Medical Record Number: [id number] Patient Account Number: [id number] Date of Birth/Sex: Treating RN: [birth year] (79 y.o. [name] Primary Care Provider: Shon Baton Other Clinician: Referring Provider: Treating Provider/Extender: Lowella Fairy in Treatment: 2 History of Present Illness HPI Description: 01/26/2020 on evaluation today patient presents for initial evaluation here in our clinic concerning issues she has been having with a wound over her shin of the left leg. She tells me that this occurred near the beginning of June 2021 when she scraped her leg on something that she would not disclose. With that being said she tells me that she has been using a DuoDERM dressing over this as recommended unfortunately that really has not made a big improvement. It is possible based on what I am seeing that she likely had a hematoma which led to a deeper wound which is why it really has not healed as much as we would like to have seen up to this point. Fortunately there is no signs of active infection at this point she has completed roughly 2 weeks ago a 2- week prescription  of doxycycline. The patient is currently on Eliquis due to a history of DVT She also has cancer that the chemotherapy is on hold right now I s. do not believe that the chemotherapy was likely providing a huge issue as far as redness from healed lesion I think it is probably more likely that she does has a deeper wound than what is obvious and that needs to be addressed. Patient also has a history of multiple sites of carcinoma including her breast among other things. She also has mild dementia for which she is on medication. 02/09/2020 on evaluation today patient appears to be doing decently well in regard to her leg ulcer. This is looking a little bit better as far as the overall cleanliness of the wound is concerned today. With that being said there does not appear to be any sign of active infection at this time which is great news. I do believe with the alginate they done better than with the DuoDERM previous. Electronic Signature(s) Signed: 02/09/2020 1:55:09 PM By: [name] PA-C Entered By: [name] on 02/09/2020 13:55:09 -------------------------------------------------------------------------------- Physical Exam Details Patient Name: Date of Service: UPTO Kelly Fila B. 02/09/2020 1:00 PM Medical Record Number: [id number] Patient Account Number: [id number] Date of Birth/Sex: Treating RN: 16-Feb-1941 (79 y.o. [name] Primary Care Provider: Shon Baton Other Clinician: Referring Provider: Treating Provider/Extender: Lowella Fairy in Treatment: 2 Constitutional Well-nourished and well-hydrated in no acute distress. Respiratory normal breathing without difficulty. Psychiatric this patient is able to make decisions and demonstrates good insight into disease process. Alert and Oriented x 3. pleasant and cooperative. Notes Upon inspection patient's wound bed actually showed signs of good granulation at this point I do feel like it  is actually much  cleaner than it was previous and overall very pleased. Electronic Signature(s) Signed: 02/09/2020 1:55:22 PM By: [name] PA-C Entered By: [name] on 02/09/2020 13:55:21 -------------------------------------------------------------------------------- Physician Orders Details Patient Name: Date of Service: UPTO Kelly Fila B. 02/09/2020 1:00 PM Medical Record Number: [id number] Patient Account Number: [id number] Date of Birth/Sex: Treating RN: November 04, 1940 (79 y.o. [name] Primary Care Provider: Shon Baton Other Clinician: Referring Provider: Treating Provider/Extender: Lowella Fairy in Treatment: 2 Verbal / Phone Orders: No Diagnosis Coding ICD-10 Coding Code Description I87.2 Venous insufficiency (chronic) (peripheral) L97.822 Non-pressure chronic ulcer of other part of left lower leg with fat layer exposed F01.50 Vascular dementia without behavioral disturbance Z85.9 Personal history of malignant neoplasm, unspecified Follow-up Appointments Return Appointment in 1 week. Dressing Change Frequency Wound #1 Left,Anterior Lower Leg Change Dressing every other day. Wound Cleansing Wound #1 Left,Anterior Lower Leg May shower with protection. - on days you do not change dressing May shower and wash wound with soap and water. - with dressing changes Primary Wound Dressing Wound #1 Left,Anterior Lower Leg lginate with Silver - pack lightly into wound to fill space Calcium A Secondary Dressing Wound #1 Left,Anterior Lower Leg Foam Border Edema Control Avoid standing for long periods of time Elevate legs to the level of the heart or above for 30 minutes daily and/or when sitting, a frequency of: Exercise regularly Electronic Signature(s) Signed: 02/09/2020 5:36:27 PM By: [name] RN, BSN Signed: 02/09/2020 5:47:16 PM By: [name] PA-C Entered By: [name] on 02/09/2020  13:54:16 -------------------------------------------------------------------------------- Problem List Details Patient Name: Date of Service: UPTO Kelly Fila B. 02/09/2020 1:00 PM Medical Record Number: [id number] Patient Account Number: [id number] Date of Birth/Sex: Treating RN: 1941/04/02 (79 y.o. [name] Primary Care Provider: Shon Baton Other Clinician: Referring Provider: Treating Provider/Extender: Lowella Fairy in Treatment: 2 Active Problems ICD-10 Encounter Code Description Active Date MDM Diagnosis I87.2 Venous insufficiency (chronic) (peripheral) 01/26/2020 No Yes L97.822 Non-pressure chronic ulcer of other part of left lower leg with fat layer exposed9/15/2021 No Yes F01.50 Vascular dementia without behavioral disturbance 01/26/2020 No Yes Z85.9 Personal history of malignant neoplasm, unspecified 01/26/2020 No Yes Inactive Problems Resolved Problems Electronic Signature(s) Signed: 02/09/2020 1:18:59 PM By: [name] PA-C Entered By: [name] on 02/09/2020 13:18:58 -------------------------------------------------------------------------------- Progress Note Details Patient Name: Date of Service: UPTO Kelly Fila B. 02/09/2020 1:00 PM Medical Record Number: [id number] Patient Account Number: [id number] Date of Birth/Sex: Treating RN: 1941/03/13 (79 y.o. [name] Primary Care Provider: Shon Baton Other Clinician: Referring Provider: Treating Provider/Extender: Lowella Fairy in Treatment: 2 Subjective Chief Complaint Information obtained from Patient Left LE Ulcer History of Present Illness (HPI) 01/26/2020 on evaluation today patient presents for initial evaluation here in our clinic concerning issues she has been having with a wound over her shin of the left leg. She tells me that this occurred near the beginning of June 2021 when she scraped her leg on something that she would not disclose.  With that being said she tells me that she has been using a DuoDERM dressing over this as recommended unfortunately that really has not made a big improvement. It is possible based on what I am seeing that she likely had a hematoma which led to a deeper wound which is why it really has not healed as much as we would like to have seen up to this  point. Fortunately there is no signs of active infection at this point she has completed roughly 2 weeks ago a 2-week prescription of doxycycline. The patient is currently on Eliquis due to a history of DVT She also has cancer that the chemotherapy is on hold right now I do not believe that s. the chemotherapy was likely providing a huge issue as far as redness from healed lesion I think it is probably more likely that she does has a deeper wound than what is obvious and that needs to be addressed. Patient also has a history of multiple sites of carcinoma including her breast among other things. She also has mild dementia for which she is on medication. 02/09/2020 on evaluation today patient appears to be doing decently well in regard to her leg ulcer. This is looking a little bit better as far as the overall cleanliness of the wound is concerned today. With that being said there does not appear to be any sign of active infection at this time which is great news. I do believe with the alginate they done better than with the DuoDERM previous. Objective Constitutional Well-nourished and well-hydrated in no acute distress. Vitals Time Taken: 1:17 PM, Height: 59 in, Weight: 108 lbs, BMI: 21.8, Temperature: 98.4 F, Pulse: 66 bpm, Respiratory Rate: 18 breaths/min, Blood Pressure: 164/87 mmHg. Respiratory normal breathing without difficulty. Psychiatric this patient is able to make decisions and demonstrates good insight into disease process. Alert and Oriented x 3. pleasant and cooperative. General Notes: Upon inspection patient's wound bed actually showed  signs of good granulation at this point I do feel like it is actually much cleaner than it was previous and overall very pleased. Integumentary (Hair, Skin) Wound #1 status is Open. Original cause of wound was Trauma. The wound is located on the Left,Anterior Lower Leg. The wound measures 1.5cm length x 0.6cm width x 0.3cm depth; 0.707cm^2 area and 0.212cm^3 volume. There is Fat Layer (Subcutaneous Tissue) exposed. There is no tunneling or undermining noted. There is a medium amount of serosanguineous drainage noted. The wound margin is epibole. There is medium (34-66%) red granulation within the wound bed. There is a medium (34-66%) amount of necrotic tissue within the wound bed including Adherent Slough. Assessment Active Problems ICD-10 Venous insufficiency (chronic) (peripheral) Non-pressure chronic ulcer of other part of left lower leg with fat layer exposed Vascular dementia without behavioral disturbance Personal history of malignant neoplasm, unspecified Plan Follow-up Appointments: Return Appointment in 1 week. Dressing Change Frequency: Wound #1 Left,Anterior Lower Leg: Change Dressing every other day. Wound Cleansing: Wound #1 Left,Anterior Lower Leg: May shower with protection. - on days you do not change dressing May shower and wash wound with soap and water. - with dressing changes Primary Wound Dressing: Wound #1 Left,Anterior Lower Leg: Calcium Alginate with Silver - pack lightly into wound to fill space Secondary Dressing: Wound #1 Left,Anterior Lower Leg: Foam Border Edema Control: Avoid standing for long periods of time Elevate legs to the level of the heart or above for 30 minutes daily and/or when sitting, a frequency of: Exercise regularly 1. I [name] suggest that we continue with the silver alginate dressing it does need to be cut in a small strip to somewhat pack into the wound bed not too tightly but enough to help keep in contact with the base of the  wound. 2. Also can recommend at this time that the patient continue to monitor for any signs of worsening infection or increased pain her husband is  performing the dressing changes. 3. We will cover with a border foam. We will see patient back for reevaluation in 1 week here in the clinic. If anything worsens or changes patient will contact our office for additional recommendations. Electronic Signature(s) Signed: 02/09/2020 1:55:50 PM By: [name] PA-C Entered By: [name] on 02/09/2020 13:55:50 -------------------------------------------------------------------------------- SuperBill Details Patient Name: Date of Service: UPTO [name] 02/09/2020 Medical Record Number: [id number] Patient Account Number: [id number] Date of Birth/Sex: Treating RN: [birth year] (79 y.o. [name], [name] Primary Care Provider: Shon Baton Other Clinician: Referring Provider: Treating Provider/Extender: Lowella Fairy in Treatment: 2 Diagnosis Coding ICD-10 Codes Code Description I87.2 Venous insufficiency (chronic) (peripheral) L97.822 Non-pressure chronic ulcer of other part of left lower leg with fat layer exposed F01.50 Vascular dementia without behavioral disturbance Z85.9 Personal history of malignant neoplasm, unspecified Facility Procedures CPT4 Code: [id number] Description: 99213 - WOUND CARE VISIT-LEV 3 EST PT Modifier: Quantity: 1 Physician Procedures : CPT4 Code Description Modifier [id number] - WC PHYS LEVEL 3 - EST PT ICD-10 Diagnosis Description I87.2 Venous insufficiency (chronic) (peripheral) L97.822 Non-pressure chronic ulcer of other part of left lower leg with fat layer exposed F01.50  Vascular dementia without behavioral disturbance Z85.9 Personal history of malignant neoplasm, unspecified Quantity: 1 Electronic Signature(s) Signed: 02/09/2020 5:36:27 PM By: [name] RN, BSN Signed: 02/09/2020 5:47:16 PM By: [name]  PA-C Previous Signature: 02/09/2020 1:56:14 PM Version By: [name] PA-C Entered By: [name] on 02/09/2020 13:57:38

## 2020-02-10 DIAGNOSIS — N1831 Chronic kidney disease, stage 3a: Secondary | ICD-10-CM | POA: Diagnosis not present

## 2020-02-10 DIAGNOSIS — Z0000 Encounter for general adult medical examination without abnormal findings: Secondary | ICD-10-CM | POA: Diagnosis not present

## 2020-02-10 DIAGNOSIS — I82401 Acute embolism and thrombosis of unspecified deep veins of right lower extremity: Secondary | ICD-10-CM | POA: Diagnosis not present

## 2020-02-10 DIAGNOSIS — I2510 Atherosclerotic heart disease of native coronary artery without angina pectoris: Secondary | ICD-10-CM | POA: Diagnosis not present

## 2020-02-10 DIAGNOSIS — C7951 Secondary malignant neoplasm of bone: Secondary | ICD-10-CM | POA: Diagnosis not present

## 2020-02-10 DIAGNOSIS — C3431 Malignant neoplasm of lower lobe, right bronchus or lung: Secondary | ICD-10-CM | POA: Diagnosis not present

## 2020-02-10 DIAGNOSIS — R413 Other amnesia: Secondary | ICD-10-CM | POA: Diagnosis not present

## 2020-02-10 DIAGNOSIS — Z7901 Long term (current) use of anticoagulants: Secondary | ICD-10-CM | POA: Diagnosis not present

## 2020-02-10 DIAGNOSIS — I129 Hypertensive chronic kidney disease with stage 1 through stage 4 chronic kidney disease, or unspecified chronic kidney disease: Secondary | ICD-10-CM | POA: Diagnosis not present

## 2020-02-10 DIAGNOSIS — Z23 Encounter for immunization: Secondary | ICD-10-CM | POA: Diagnosis not present

## 2020-02-10 DIAGNOSIS — D6481 Anemia due to antineoplastic chemotherapy: Secondary | ICD-10-CM | POA: Diagnosis not present

## 2020-02-10 DIAGNOSIS — C55 Malignant neoplasm of uterus, part unspecified: Secondary | ICD-10-CM | POA: Diagnosis not present

## 2020-02-11 DIAGNOSIS — I129 Hypertensive chronic kidney disease with stage 1 through stage 4 chronic kidney disease, or unspecified chronic kidney disease: Secondary | ICD-10-CM | POA: Diagnosis not present

## 2020-02-11 DIAGNOSIS — R82998 Other abnormal findings in urine: Secondary | ICD-10-CM | POA: Diagnosis not present

## 2020-02-11 NOTE — Progress Notes (Signed)
[name], LOTTER ([id number]) Visit Report for 02/09/2020 Arrival Information Details Patient Name: Date of Service: UPTO [name] Stairs 02/09/2020 1:00 PM Medical Record Number: [id number] Patient Account Number: [id number] Date of Birth/Sex: Treating RN: September 01, 1940 (79 y.o. Orvan Falconer Primary Care Kaius Daino: Shon Baton Other Clinician: Referring Jaima Janney: Treating Kamylle Axelson/Extender: Lowella Fairy in Treatment: 2 Visit Information History Since Last Visit All ordered tests and consults were completed: No Patient Arrived: Ambulatory Added or deleted any medications: No Arrival Time: 13:17 Any new allergies or adverse reactions: No Accompanied By: husband Had a fall or experienced change in No Transfer Assistance: None activities of daily living that may affect Patient Identification Verified: Yes risk of falls: Secondary Verification Process Completed: Yes Signs or symptoms of abuse/neglect since last visito No Patient Requires Transmission-Based Precautions: No Hospitalized since last visit: No Patient Has Alerts: Yes Implantable device outside of the clinic excluding No Patient Alerts: Patient on Blood Thinner cellular tissue based products placed in the center since last visit: Has Dressing in Place as Prescribed: Yes Pain Present Now: No Electronic Signature(s) Signed: 02/11/2020 4:20:16 PM By: [name] RN Entered By: [name] on 02/09/2020 13:17:42 -------------------------------------------------------------------------------- Clinic Level of Care Assessment Details Patient Name: Date of Service: UPTO [name] Stairs 02/09/2020 1:00 PM Medical Record Number: [id number] Patient Account Number: [id number] Date of Birth/Sex: Treating RN: [birth year] (79 y.o. [name], [name] Primary Care Derriona Branscom: Shon Baton Other Clinician: Referring Sirron Francesconi: Treating Byard Carranza/Extender: Lowella Fairy in Treatment: 2 Clinic Level of Care  Assessment Items TOOL 4 Quantity Score []  - 0 Use when only an EandM is performed on FOLLOW-UP visit ASSESSMENTS - Nursing Assessment / Reassessment X- 1 10 Reassessment of Co-morbidities (includes updates in patient status) X- 1 5 Reassessment of Adherence to Treatment Plan ASSESSMENTS - Wound and Skin A ssessment / Reassessment X - Simple Wound Assessment / Reassessment - one wound 1 5 []  - 0 Complex Wound Assessment / Reassessment - multiple wounds []  - 0 Dermatologic / Skin Assessment (not related to wound area) ASSESSMENTS - Focused Assessment []  - 0 Circumferential Edema Measurements - multi extremities []  - 0 Nutritional Assessment / Counseling / Intervention []  - 0 Lower Extremity Assessment (monofilament, tuning fork, pulses) []  - 0 Peripheral Arterial Disease Assessment (using hand held doppler) ASSESSMENTS - Ostomy and/or Continence Assessment and Care []  - 0 Incontinence Assessment and Management []  - 0 Ostomy Care Assessment and Management (repouching, etc.) PROCESS - Coordination of Care X - Simple Patient / Family Education for ongoing care 1 15 []  - 0 Complex (extensive) Patient / Family Education for ongoing care X- 1 10 Staff obtains Programmer, systems, Records, T Results / Process Orders est []  - 0 Staff telephones HHA, Nursing Homes / Clarify orders / etc []  - 0 Routine Transfer to another Facility (non-emergent condition) []  - 0 Routine Hospital Admission (non-emergent condition) []  - 0 New Admissions / Biomedical engineer / Ordering NPWT Apligraf, etc. , []  - 0 Emergency Hospital Admission (emergent condition) X- 1 10 Simple Discharge Coordination []  - 0 Complex (extensive) Discharge Coordination PROCESS - Special Needs []  - 0 Pediatric / Minor Patient Management []  - 0 Isolation Patient Management []  - 0 Hearing / Language / Visual special needs []  - 0 Assessment of Community assistance (transportation, D/C planning, etc.) []  -  0 Additional assistance / Altered mentation []  - 0 Support Surface(s) Assessment (bed, cushion, seat, etc.) INTERVENTIONS - Wound Cleansing / Measurement X -  Simple Wound Cleansing - one wound 1 5 []  - 0 Complex Wound Cleansing - multiple wounds X- 1 5 Wound Imaging (photographs - any number of wounds) []  - 0 Wound Tracing (instead of photographs) X- 1 5 Simple Wound Measurement - one wound []  - 0 Complex Wound Measurement - multiple wounds INTERVENTIONS - Wound Dressings X - Small Wound Dressing one or multiple wounds 1 10 []  - 0 Medium Wound Dressing one or multiple wounds []  - 0 Large Wound Dressing one or multiple wounds X- 1 5 Application of Medications - topical []  - 0 Application of Medications - injection INTERVENTIONS - Miscellaneous []  - 0 External ear exam []  - 0 Specimen Collection (cultures, biopsies, blood, body fluids, etc.) []  - 0 Specimen(s) / Culture(s) sent or taken to Lab for analysis []  - 0 Patient Transfer (multiple staff / Civil Service fast streamer / Similar devices) []  - 0 Simple Staple / Suture removal (25 or less) []  - 0 Complex Staple / Suture removal (26 or more) []  - 0 Hypo / Hyperglycemic Management (close monitor of Blood Glucose) []  - 0 Ankle / Brachial Index (ABI) - do not check if billed separately X- 1 5 Vital Signs Has the patient been seen at the hospital within the last three years: Yes Total Score: 90 Level Of Care: New/Established - Level 3 Electronic Signature(s) Signed: 02/09/2020 5:36:27 PM By: [name] RN, BSN Entered By: [name] on 02/09/2020 13:51:47 -------------------------------------------------------------------------------- Encounter Discharge Information Details Patient Name: Date of Service: UPTO Kelly Fila B. 02/09/2020 1:00 PM Medical Record Number: [id number] Patient Account Number: [id number] Date of Birth/Sex: Treating RN: 01-15-1941 (79 y.o. [name] Primary Care Bria Portales: Shon Baton Other  Clinician: Referring Jaycee Mckellips: Treating Lamine Laton/Extender: Lowella Fairy in Treatment: 2 Encounter Discharge Information Items Discharge Condition: Stable Ambulatory Status: Ambulatory Discharge Destination: Home Transportation: Private Auto Accompanied By: spouse Schedule Follow-up Appointment: Yes Clinical Summary of Care: Patient Declined Electronic Signature(s) Signed: 02/09/2020 5:36:27 PM By: [name] RN, BSN Entered By: [name] on 02/09/2020 15:00:40 -------------------------------------------------------------------------------- Lower Extremity Assessment Details Patient Name: Date of Service: UPTO Kelly Fila B. 02/09/2020 1:00 PM Medical Record Number: [id number] Patient Account Number: [id number] Date of Birth/Sex: Treating RN: March 13, 1941 (79 y.o. Orvan Falconer Primary Care Ellison Leisure: Shon Baton Other Clinician: Referring Chriselda Leppert: Treating Sankalp Ferrell/Extender: Lowella Fairy in Treatment: 2 Edema Assessment Assessed: [Left: No] [Right: No] Edema: [Left: N] [Right: o] Calf Left: Right: Point of Measurement: 25 cm From Medial Instep 31 cm Ankle Left: Right: Point of Measurement: 7 cm From Medial Instep 20 cm Electronic Signature(s) Signed: 02/11/2020 4:20:16 PM By: [name] RN Entered By: [name] on 02/09/2020 13:23:55 -------------------------------------------------------------------------------- Multi-Disciplinary Care Plan Details Patient Name: Date of Service: UPTO Kelly Fila B. 02/09/2020 1:00 PM Medical Record Number: [id number] Patient Account Number: [id number] Date of Birth/Sex: Treating RN: [birth year] (79 y.o. [name] Primary Care Sharma Lawrance: Shon Baton Other Clinician: Referring Hend Mccarrell: Treating Dashon Mcintire/Extender: Lowella Fairy in Treatment: 2 Active Inactive Nutrition Nursing Diagnoses: Imbalanced nutrition Goals: Patient/caregiver agrees to and  verbalizes understanding of need to use nutritional supplements and/or vitamins as prescribed Date Initiated: 01/26/2020 T arget Resolution Date: 02/25/2020 Goal Status: Active Interventions: Assess patient nutrition upon admission and as needed per policy Provide education on elevated blood sugars and impact on wound healing Notes: Venous Leg Ulcer Nursing Diagnoses: Knowledge deficit related to disease process and management Potential for venous Insuffiency (use before diagnosis  confirmed) Goals: Patient will maintain optimal edema control Date Initiated: 01/26/2020 Target Resolution Date: 02/25/2020 Goal Status: Active Interventions: Assess peripheral edema status every visit. Compression as ordered Treatment Activities: T ordered outside of clinic : 01/26/2020 est Therapeutic compression applied : 01/26/2020 Notes: Wound/Skin Impairment Nursing Diagnoses: Impaired tissue integrity Knowledge deficit related to ulceration/compromised skin integrity Goals: Patient/caregiver will verbalize understanding of skin care regimen Date Initiated: 01/26/2020 Target Resolution Date: 02/25/2020 Goal Status: Active Ulcer/skin breakdown will have a volume reduction of 30% by week 4 Date Initiated: 01/26/2020 Target Resolution Date: 02/25/2020 Goal Status: Active Interventions: Assess patient/caregiver ability to obtain necessary supplies Assess patient/caregiver ability to perform ulcer/skin care regimen upon admission and as needed Assess ulceration(s) every visit Provide education on ulcer and skin care Treatment Activities: Skin care regimen initiated : 01/26/2020 Topical wound management initiated : 01/26/2020 Notes: Electronic Signature(s) Signed: 02/09/2020 5:36:27 PM By: [name] RN, BSN Entered By: [name] on 02/09/2020 13:50:35 -------------------------------------------------------------------------------- Pain Assessment Details Patient Name: Date of  Service: UPTO Kelly Fila B. 02/09/2020 1:00 PM Medical Record Number: [id number] Patient Account Number: [id number] Date of Birth/Sex: Treating RN: 1940/06/18 (79 y.o. Orvan Falconer Primary Care Alekhya Gravlin: Shon Baton Other Clinician: Referring Keylin Podolsky: Treating Breann Losano/Extender: Lowella Fairy in Treatment: 2 Active Problems Location of Pain Severity and Description of Pain Patient Has Paino No Site Locations Pain Management and Medication Current Pain Management: Electronic Signature(s) Signed: 02/11/2020 4:20:16 PM By: [name] RN Entered By: [name] on 02/09/2020 13:18:04 -------------------------------------------------------------------------------- Patient/Caregiver Education Details Patient Name: Date of Service: UPTO [name] Stairs 9/29/2021andnbsp1:00 PM Medical Record Number: [id number] Patient Account Number: [id number] Date of Birth/Gender: Treating RN: [birth year] (79 y.o. [name] Primary Care Physician: Shon Baton Other Clinician: Referring Physician: Treating Physician/Extender: Lowella Fairy in Treatment: 2 Education Assessment Education Provided To: Patient Education Topics Provided Wound/Skin Impairment: Methods: Explain/Verbal Responses: Reinforcements needed, State content correctly Electronic Signature(s) Signed: 02/09/2020 5:36:27 PM By: [name] RN, BSN Entered By: [name] on 02/09/2020 13:51:06 -------------------------------------------------------------------------------- Wound Assessment Details Patient Name: Date of Service: UPTO Kelly Fila B. 02/09/2020 1:00 PM Medical Record Number: [id number] Patient Account Number: [id number] Date of Birth/Sex: Treating RN: 10-09-1940 (79 y.o. Orvan Falconer Primary Care Mataya Kilduff: Shon Baton Other Clinician: Referring Cristella Stiver: Treating Vira Chaplin/Extender: Lowella Fairy in Treatment: 2 Wound Status Wound  Number: 1 Primary Venous Leg Ulcer Etiology: Wound Location: Left, Anterior Lower Leg Wound Open Wounding Event: Trauma Status: Date Acquired: 10/12/2019 Comorbid Anemia, Deep Vein Thrombosis, Hypertension, Received Weeks Of Treatment: 2 History: Chemotherapy, Received Radiation Clustered Wound: No Wound Measurements Length: (cm) 1.5 Width: (cm) 0.6 Depth: (cm) 0.3 Area: (cm) 0.707 Volume: (cm) 0.212 % Reduction in Area: -50.1% % Reduction in Volume: -50.4% Epithelialization: None Tunneling: No Undermining: No Wound Description Classification: Full Thickness Without Exposed Support Structures Wound Margin: Epibole Exudate Amount: Medium Exudate Type: Serosanguineous Exudate Color: red, brown Foul Odor After Cleansing: No Slough/Fibrino Yes Wound Bed Granulation Amount: Medium (34-66%) Exposed Structure Granulation Quality: Red Fascia Exposed: No Necrotic Amount: Medium (34-66%) Fat Layer (Subcutaneous Tissue) Exposed: Yes Necrotic Quality: Adherent Slough Tendon Exposed: No Muscle Exposed: No Joint Exposed: No Bone Exposed: No Treatment Notes Wound #1 (Left, Anterior Lower Leg) 3. Primary Dressing Applied Calcium Alginate Ag 4. Secondary Dressing Foam Border Dressing Electronic Signature(s) Signed: 02/11/2020 4:20:16 PM By: [name] RN Entered By: [name] on 02/09/2020 13:24:20 -------------------------------------------------------------------------------- Vitals Details Patient Name: Date of  Service: UPTO [name] Stairs 02/09/2020 1:00 PM Medical Record Number: [id number] Patient Account Number: [id number] Date of Birth/Sex: Treating RN: [birth year] (79 y.o. Orvan Falconer Primary Care Ilham Roughton: Shon Baton Other Clinician: Referring Tonji Elliff: Treating Janiyah Beery/Extender: Lowella Fairy in Treatment: 2 Vital Signs Time Taken: 13:17 Temperature (F): 98.4 Height (in): 59 Pulse (bpm): 66 Weight (lbs): 108 Respiratory Rate  (breaths/min): 18 Body Mass Index (BMI): 21.8 Blood Pressure (mmHg): 164/87 Reference Range: 80 - 120 mg / dl Electronic Signature(s) Signed: 02/11/2020 4:20:16 PM By: [name] RN Entered By: [name] on 02/09/2020 13:17:58

## 2020-02-16 ENCOUNTER — Encounter (HOSPITAL_BASED_OUTPATIENT_CLINIC_OR_DEPARTMENT_OTHER): Payer: Medicare Other | Attending: Physician Assistant | Admitting: Physician Assistant

## 2020-02-16 DIAGNOSIS — I872 Venous insufficiency (chronic) (peripheral): Secondary | ICD-10-CM | POA: Insufficient documentation

## 2020-02-16 DIAGNOSIS — L97822 Non-pressure chronic ulcer of other part of left lower leg with fat layer exposed: Secondary | ICD-10-CM | POA: Diagnosis not present

## 2020-02-16 DIAGNOSIS — Z86718 Personal history of other venous thrombosis and embolism: Secondary | ICD-10-CM | POA: Insufficient documentation

## 2020-02-16 DIAGNOSIS — F0150 Vascular dementia without behavioral disturbance: Secondary | ICD-10-CM | POA: Diagnosis not present

## 2020-02-16 DIAGNOSIS — Z7901 Long term (current) use of anticoagulants: Secondary | ICD-10-CM | POA: Diagnosis not present

## 2020-02-16 DIAGNOSIS — Z853 Personal history of malignant neoplasm of breast: Secondary | ICD-10-CM | POA: Insufficient documentation

## 2020-02-16 NOTE — Progress Notes (Addendum)
[name], MUCH ([id number]) Visit Report for 02/16/2020 Chief Complaint Document Details Patient Name: Date of Service: UPTO [name] 02/16/2020 2:00 PM Medical Record Number: [id number] Patient Account Number: [id number] Date of Birth/Sex: Treating RN: 1940/12/14 (79 y.o. [name] Primary Care Provider: Shon Baton Other Clinician: Referring Provider: Treating Provider/Extender: Lowella Fairy in Treatment: 3 Information Obtained from: Patient Chief Complaint Left LE Ulcer Electronic Signature(s) Signed: 02/16/2020 2:14:08 PM By: [name] PA-C Entered By: [name] on 02/16/2020 14:14:07 -------------------------------------------------------------------------------- Debridement Details Patient Name: Date of Service: UPTO Kelly Fila B. 02/16/2020 2:00 PM Medical Record Number: [id number] Patient Account Number: [id number] Date of Birth/Sex: Treating RN: 1941/01/12 (79 y.o. [name], [name] Primary Care Provider: Shon Baton Other Clinician: Referring Provider: Treating Provider/Extender: Lowella Fairy in Treatment: 3 Debridement Performed for Assessment: Wound #1 Left,Anterior Lower Leg Performed By: Physician [name], PA Debridement Type: Debridement Severity of Tissue Pre Debridement: Fat layer exposed Level of Consciousness (Pre-procedure): Awake and Alert Pre-procedure Verification/Time Out Yes - 15:20 Taken: Start Time: 15:20 Pain Control: Lidocaine 5% topical ointment T Area Debrided (L x W): otal 1.1 (cm) x 0.7 (cm) = 0.77 (cm) Tissue and other material debrided: Viable, Non-Viable, Eschar, Subcutaneous Level: Skin/Subcutaneous Tissue Debridement Description: Excisional Instrument: Curette Bleeding: Minimum Hemostasis Achieved: Pressure End Time: 15:22 Procedural Pain: 5 Post Procedural Pain: 2 Response to Treatment: Procedure was tolerated well Level of Consciousness (Post- Awake and  Alert procedure): Post Debridement Measurements of Total Wound Length: (cm) 1.1 Width: (cm) 0.7 Depth: (cm) 0.3 Volume: (cm) 0.181 Character of Wound/Ulcer Post Debridement: Improved Severity of Tissue Post Debridement: Fat layer exposed Post Procedure Diagnosis Same as Pre-procedure Electronic Signature(s) Signed: 02/16/2020 6:14:29 PM By: [name] RN, BSN Signed: 02/16/2020 6:38:49 PM By: [name] PA-C Entered By: [name] on 02/16/2020 15:22:06 -------------------------------------------------------------------------------- HPI Details Patient Name: Date of Service: UPTO Kelly Fila B. 02/16/2020 2:00 PM Medical Record Number: [id number] Patient Account Number: [id number] Date of Birth/Sex: Treating RN: Dec 17, 1940 (79 y.o. [name] Primary Care Provider: Shon Baton Other Clinician: Referring Provider: Treating Provider/Extender: Lowella Fairy in Treatment: 3 History of Present Illness HPI Description: 01/26/2020 on evaluation today patient presents for initial evaluation here in our clinic concerning issues she has been having with a wound over her shin of the left leg. She tells me that this occurred near the beginning of June 2021 when she scraped her leg on something that she would not disclose. With that being said she tells me that she has been using a DuoDERM dressing over this as recommended unfortunately that really has not made a big improvement. It is possible based on what I am seeing that she likely had a hematoma which led to a deeper wound which is why it really has not healed as much as we would like to have seen up to this point. Fortunately there is no signs of active infection at this point she has completed roughly 2 weeks ago a 2- week prescription of doxycycline. The patient is currently on Eliquis due to a history of DVT She also has cancer that the chemotherapy is on hold right now I s. do not believe that the  chemotherapy was likely providing a huge issue as far as redness from healed lesion I think it is probably more likely that she does has a deeper wound than what is obvious and that needs  to be addressed. Patient also has a history of multiple sites of carcinoma including her breast among other things. She also has mild dementia for which she is on medication. 02/09/2020 on evaluation today patient appears to be doing decently well in regard to her leg ulcer. This is looking a little bit better as far as the overall cleanliness of the wound is concerned today. With that being said there does not appear to be any sign of active infection at this time which is great news. I do believe with the alginate they done better than with the DuoDERM previous. 02/16/2020 on evaluation today patient appears to actually be doing okay in regard to her wound. She does have a little bit of a scab over this area which I am afraid is actually collecting some fluid underneath initially her and her husband were wanting to not remove this. With that being said I feel like it is good to be of utmost importance to remove it as I do believe should be trapping fluid underneath. In the end they agreed to do what I felt was best. Electronic Signature(s) Signed: 02/16/2020 3:24:48 PM By: [name] PA-C Entered By: [name] on 02/16/2020 15:24:48 -------------------------------------------------------------------------------- Physical Exam Details Patient Name: Date of Service: UPTO Kelly Fila B. 02/16/2020 2:00 PM Medical Record Number: [id number] Patient Account Number: [id number] Date of Birth/Sex: Treating RN: 08/08/1940 (79 y.o. [name] Primary Care Provider: Shon Baton Other Clinician: Referring Provider: Treating Provider/Extender: Lowella Fairy in Treatment: 3 Constitutional Well-nourished and well-hydrated in no acute distress. Respiratory normal breathing without  difficulty. Psychiatric this patient is able to make decisions and demonstrates good insight into disease process. Alert and Oriented x 3. pleasant and cooperative. Notes I did gently perform sharp debridement to clear away some of the scab and as well some of the drainage and slough that was collected underneath this area. The patient tolerated all that today without complication post debridement the wound bed appears to be doing much better which is great news. Electronic Signature(s) Signed: 02/16/2020 3:25:07 PM By: [name] PA-C Entered By: [name] on 02/16/2020 15:25:06 -------------------------------------------------------------------------------- Physician Orders Details Patient Name: Date of Service: UPTO Kelly Fila B. 02/16/2020 2:00 PM Medical Record Number: [id number] Patient Account Number: [id number] Date of Birth/Sex: Treating RN: 13-Nov-1940 (79 y.o. [name] Primary Care Provider: Shon Baton Other Clinician: Referring Provider: Treating Provider/Extender: Lowella Fairy in Treatment: 3 Verbal / Phone Orders: No Diagnosis Coding ICD-10 Coding Code Description I87.2 Venous insufficiency (chronic) (peripheral) L97.822 Non-pressure chronic ulcer of other part of left lower leg with fat layer exposed F01.50 Vascular dementia without behavioral disturbance Z85.9 Personal history of malignant neoplasm, unspecified Follow-up Appointments Return Appointment in 2 weeks. Dressing Change Frequency Wound #1 Left,Anterior Lower Leg Change Dressing every other day. Wound Cleansing Wound #1 Left,Anterior Lower Leg May shower with protection. - on days you do not change dressing May shower and wash wound with soap and water. - with dressing changes Primary Wound Dressing Wound #1 Left,Anterior Lower Leg Silver Collagen - moisten with hydrogel or KY gel Secondary Dressing Wound #1 Left,Anterior Lower Leg Drawtex - cut to fit inside  wound edges to hold collagen in place Foam Border Edema Control Avoid standing for long periods of time Elevate legs to the level of the heart or above for 30 minutes daily and/or when sitting, a frequency of: Exercise regularly Electronic Signature(s) Signed: 02/16/2020  6:14:29 PM By: [name] RN, BSN Signed: 02/16/2020 6:38:49 PM By: [name] PA-C Entered By: [name] on 02/16/2020 15:25:35 -------------------------------------------------------------------------------- Problem List Details Patient Name: Date of Service: UPTO Kelly Fila B. 02/16/2020 2:00 PM Medical Record Number: [id number] Patient Account Number: [id number] Date of Birth/Sex: Treating RN: [birth year] (79 y.o. [name] Primary Care Provider: Shon Baton Other Clinician: Referring Provider: Treating Provider/Extender: Lowella Fairy in Treatment: 3 Active Problems ICD-10 Encounter Code Description Active Date MDM Diagnosis I87.2 Venous insufficiency (chronic) (peripheral) 01/26/2020 No Yes L97.822 Non-pressure chronic ulcer of other part of left lower leg with fat layer exposed9/15/2021 No Yes F01.50 Vascular dementia without behavioral disturbance 01/26/2020 No Yes Z85.9 Personal history of malignant neoplasm, unspecified 01/26/2020 No Yes Inactive Problems Resolved Problems Electronic Signature(s) Signed: 02/16/2020 2:14:01 PM By: [name] PA-C Entered By: [name] on 02/16/2020 14:14:01 -------------------------------------------------------------------------------- Progress Note Details Patient Name: Date of Service: UPTO Kelly Fila B. 02/16/2020 2:00 PM Medical Record Number: [id number] Patient Account Number: [id number] Date of Birth/Sex: Treating RN: 1940/11/13 (79 y.o. [name] Primary Care Provider: Shon Baton Other Clinician: Referring Provider: Treating Provider/Extender: Lowella Fairy in Treatment:  3 Subjective Chief Complaint Information obtained from Patient Left LE Ulcer History of Present Illness (HPI) 01/26/2020 on evaluation today patient presents for initial evaluation here in our clinic concerning issues she has been having with a wound over her shin of the left leg. She tells me that this occurred near the beginning of June 2021 when she scraped her leg on something that she would not disclose. With that being said she tells me that she has been using a DuoDERM dressing over this as recommended unfortunately that really has not made a big improvement. It is possible based on what I am seeing that she likely had a hematoma which led to a deeper wound which is why it really has not healed as much as we would like to have seen up to this point. Fortunately there is no signs of active infection at this point she has completed roughly 2 weeks ago a 2-week prescription of doxycycline. The patient is currently on Eliquis due to a history of DVT She also has cancer that the chemotherapy is on hold right now I do not believe that s. the chemotherapy was likely providing a huge issue as far as redness from healed lesion I think it is probably more likely that she does has a deeper wound than what is obvious and that needs to be addressed. Patient also has a history of multiple sites of carcinoma including her breast among other things. She also has mild dementia for which she is on medication. 02/09/2020 on evaluation today patient appears to be doing decently well in regard to her leg ulcer. This is looking a little bit better as far as the overall cleanliness of the wound is concerned today. With that being said there does not appear to be any sign of active infection at this time which is great news. I do believe with the alginate they done better than with the DuoDERM previous. 02/16/2020 on evaluation today patient appears to actually be doing okay in regard to her wound. She does have a  little bit of a scab over this area which I am afraid is actually collecting some fluid underneath initially her and her husband were wanting to not remove this. With that being said I feel like it  is good to be of utmost importance to remove it as I do believe should be trapping fluid underneath. In the end they agreed to do what I felt was best. Objective Constitutional Well-nourished and well-hydrated in no acute distress. Vitals Time Taken: 2:47 PM, Height: 59 in, Weight: 108 lbs, BMI: 21.8, Temperature: 98.0 F, Pulse: 70 bpm, Respiratory Rate: 18 breaths/min, Blood Pressure: 189/88 mmHg. Respiratory normal breathing without difficulty. Psychiatric this patient is able to make decisions and demonstrates good insight into disease process. Alert and Oriented x 3. pleasant and cooperative. General Notes: I did gently perform sharp debridement to clear away some of the scab and as well some of the drainage and slough that was collected underneath this area. The patient tolerated all that today without complication post debridement the wound bed appears to be doing much better which is great news. Integumentary (Hair, Skin) Wound #1 status is Open. Original cause of wound was Trauma. The wound is located on the Left,Anterior Lower Leg. The wound measures 1.1cm length x 0.7cm width x 0.1cm depth; 0.605cm^2 area and 0.06cm^3 volume. There is Fat Layer (Subcutaneous Tissue) exposed. There is no tunneling or undermining noted. There is a none present amount of drainage noted. The wound margin is epibole. There is no granulation within the wound bed. There is a large (67-100%) amount of necrotic tissue within the wound bed including Eschar. Assessment Active Problems ICD-10 Venous insufficiency (chronic) (peripheral) Non-pressure chronic ulcer of other part of left lower leg with fat layer exposed Vascular dementia without behavioral disturbance Personal history of malignant neoplasm,  unspecified Procedures Wound #1 Pre-procedure diagnosis of Wound #1 is a Venous Leg Ulcer located on the Left,Anterior Lower Leg .Severity of Tissue Pre Debridement is: Fat layer exposed. There was a Excisional Skin/Subcutaneous Tissue Debridement with a total area of 0.77 sq cm performed by [name], PA. With the following instrument(s): Curette to remove Viable and Non-Viable tissue/material. Material removed includes Eschar and Subcutaneous Tissue and after achieving pain control using Lidocaine 5% topical ointment. A time out was conducted at 15:20, prior to the start of the procedure. A Minimum amount of bleeding was controlled with Pressure. The procedure was tolerated well with a pain level of 5 throughout and a pain level of 2 following the procedure. Post Debridement Measurements: 1.1cm length x 0.7cm width x 0.3cm depth; 0.181cm^3 volume. Character of Wound/Ulcer Post Debridement is improved. Severity of Tissue Post Debridement is: Fat layer exposed. Post procedure Diagnosis Wound #1: Same as Pre-Procedure Plan Follow-up Appointments: Return Appointment in 1 week. Dressing Change Frequency: Wound #1 Left,Anterior Lower Leg: Change Dressing every other day. Wound Cleansing: Wound #1 Left,Anterior Lower Leg: May shower with protection. - on days you do not change dressing May shower and wash wound with soap and water. - with dressing changes Primary Wound Dressing: Wound #1 Left,Anterior Lower Leg: Silver Collagen - moisten with hydrogel or KY gel Secondary Dressing: Wound #1 Left,Anterior Lower Leg: Foam Border Edema Control: Avoid standing for long periods of time Elevate legs to the level of the heart or above for 30 minutes daily and/or when sitting, a frequency of: Exercise regularly 1. I would recommend at this time that we go ahead and initiate treatment with a silver collagen dressing I think this may do better for her at this point. 2. I am also can recommend  moistening this with K-Y jelly and using a border foam dressing to cover. 3. I am also can recommend that the patient keep  this covered at all times I really do not want her taking the dressing off I am afraid if so that skin to start drying out and not schedule calls regularly issues for her in the long run. We will see patient back for reevaluation in 2 weeks here in the clinic. If anything worsens or changes patient will contact our office for additional recommendations. Electronic Signature(s) Signed: 02/16/2020 3:25:57 PM By: [name] PA-C Entered By: [name] on 02/16/2020 15:25:57 -------------------------------------------------------------------------------- SuperBill Details Patient Name: Date of Service: UPTO [name] 02/16/2020 Medical Record Number: [id number] Patient Account Number: [id number] Date of Birth/Sex: Treating RN: 03-Aug-1940 (79 y.o. [name] Primary Care Provider: Shon Baton Other Clinician: Referring Provider: Treating Provider/Extender: Lowella Fairy in Treatment: 3 Diagnosis Coding ICD-10 Codes Code Description I87.2 Venous insufficiency (chronic) (peripheral) L97.822 Non-pressure chronic ulcer of other part of left lower leg with fat layer exposed F01.50 Vascular dementia without behavioral disturbance Z85.9 Personal history of malignant neoplasm, unspecified Facility Procedures CPT4 Code: [id number] Description: 37858 - DEB SUBQ TISSUE 20 SQ CM/< ICD-10 Diagnosis Description L97.822 Non-pressure chronic ulcer of other part of left lower leg with fat layer expos Modifier: ed Quantity: 1 Physician Procedures : CPT4 Code Description Modifier [id number] - WC PHYS SUBQ TISS 20 SQ CM ICD-10 Diagnosis Description L97.822 Non-pressure chronic ulcer of other part of left lower leg with fat layer exposed Quantity: 1 Electronic Signature(s) Signed: 02/16/2020 3:26:06 PM By: [name] PA-C Entered By: [name] on 02/16/2020 15:26:05

## 2020-02-17 NOTE — Progress Notes (Signed)
[name], [name] ([id number]) Visit Report for 02/16/2020 Arrival Information Details Patient Name: Date of Service: UPTO [name] 02/16/2020 2:00 PM Medical Record Number: [id number] Patient Account Number: [id number] Date of Birth/Sex: Treating RN: Aug 06, 1940 (79 y.o. [name] Primary Care Vayden Weinand: Shon Baton Other Clinician: Referring Bridget Westbrooks: Treating Brya Simerly/Extender: Lowella Fairy in Treatment: 3 Visit Information History Since Last Visit Added or deleted any medications: No Patient Arrived: Ambulatory Any new allergies or adverse reactions: No Arrival Time: 14:46 Had a fall or experienced change in No Accompanied By: huisband activities of daily living that may affect Transfer Assistance: None risk of falls: Patient Identification Verified: Yes Signs or symptoms of abuse/neglect since last visito No Secondary Verification Process Completed: Yes Hospitalized since last visit: No Patient Requires Transmission-Based Precautions: No Implantable device outside of the clinic excluding No Patient Has Alerts: Yes cellular tissue based products placed in the center Patient Alerts: Patient on Blood Thinner since last visit: Has Dressing in Place as Prescribed: Yes Pain Present Now: No Electronic Signature(s) Signed: 02/17/2020 1:11:06 PM By: Sandre Kitty Entered By: Sandre Kitty on 02/16/2020 14:47:12 -------------------------------------------------------------------------------- Encounter Discharge Information Details Patient Name: Date of Service: UPTO Kelly Fila B. 02/16/2020 2:00 PM Medical Record Number: [id number] Patient Account Number: [id number] Date of Birth/Sex: Treating RN: 12/15/1940 (79 y.o. Orvan Falconer Primary Care Didier Brandenburg: Shon Baton Other Clinician: Referring Eron Goble: Treating Marna Weniger/Extender: Lowella Fairy in Treatment: 3 Encounter Discharge Information Items Post Procedure  Vitals Discharge Condition: Stable Temperature (F): 98 Ambulatory Status: Ambulatory Pulse (bpm): 70 Discharge Destination: Home Respiratory Rate (breaths/min): 18 Transportation: Private Auto Blood Pressure (mmHg): 189/88 Accompanied By: self Schedule Follow-up Appointment: Yes Clinical Summary of Care: Patient Declined Electronic Signature(s) Signed: 02/16/2020 5:53:32 PM By: [name] RN Entered By: [name] on 02/16/2020 15:46:46 -------------------------------------------------------------------------------- Lower Extremity Assessment Details Patient Name: Date of Service: UPTO Kelly Fila B. 02/16/2020 2:00 PM Medical Record Number: [id number] Patient Account Number: [id number] Date of Birth/Sex: Treating RN: 12-21-1940 (79 y.o. [name], [name] Primary Care Janyah Singleterry: Shon Baton Other Clinician: Referring Kanae Ignatowski: Treating Jarom Govan/Extender: Lowella Fairy in Treatment: 3 Edema Assessment Assessed: [Left: Yes] [Right: No] Edema: [Left: N] [Right: o] Calf Left: Right: Point of Measurement: 25 cm From Medial Instep 31 cm Ankle Left: Right: Point of Measurement: 7 cm From Medial Instep 22 cm Vascular Assessment Pulses: Dorsalis Pedis Palpable: [Left:Yes] Electronic Signature(s) Signed: 02/16/2020 5:54:43 PM By: Deon Pilling Entered By: Deon Pilling on 02/16/2020 14:53:58 -------------------------------------------------------------------------------- [place] Details Patient Name: Date of Service: UPTO Kelly Fila B. 02/16/2020 2:00 PM Medical Record Number: [id number] Patient Account Number: [id number] Date of Birth/Sex: Treating RN: Sep 19, 1940 (79 y.o. [name] Primary Care Pernell Lenoir: Shon Baton Other Clinician: Referring Florencia Zaccaro: Treating Burman Bruington/Extender: Lowella Fairy in Treatment: 3 Active Inactive Nutrition Nursing Diagnoses: Imbalanced nutrition Goals: Patient/caregiver  agrees to and verbalizes understanding of need to use nutritional supplements and/or vitamins as prescribed Date Initiated: 01/26/2020 T arget Resolution Date: 02/25/2020 Goal Status: Active Interventions: Assess patient nutrition upon admission and as needed per policy Provide education on elevated blood sugars and impact on wound healing Notes: Venous Leg Ulcer Nursing Diagnoses: Knowledge deficit related to disease process and management Potential for venous Insuffiency (use before diagnosis confirmed) Goals: Patient will maintain optimal edema control Date Initiated: 01/26/2020 Target Resolution Date: 02/25/2020 Goal Status: Active Interventions: Assess peripheral edema status every visit. Compression as ordered Treatment  Activities: T ordered outside of clinic : 01/26/2020 est Therapeutic compression applied : 01/26/2020 Notes: Wound/Skin Impairment Nursing Diagnoses: Impaired tissue integrity Knowledge deficit related to ulceration/compromised skin integrity Goals: Patient/caregiver will verbalize understanding of skin care regimen Date Initiated: 01/26/2020 Target Resolution Date: 02/25/2020 Goal Status: Active Ulcer/skin breakdown will have a volume reduction of 30% by week 4 Date Initiated: 01/26/2020 Target Resolution Date: 02/25/2020 Goal Status: Active Interventions: Assess patient/caregiver ability to obtain necessary supplies Assess patient/caregiver ability to perform ulcer/skin care regimen upon admission and as needed Assess ulceration(s) every visit Provide education on ulcer and skin care Treatment Activities: Skin care regimen initiated : 01/26/2020 Topical wound management initiated : 01/26/2020 Notes: Electronic Signature(s) Signed: 02/16/2020 6:14:29 PM By: [name] RN, BSN Entered By: [name] on 02/16/2020 15:12:15 -------------------------------------------------------------------------------- Pain Assessment Details Patient  Name: Date of Service: UPTO Kelly Fila B. 02/16/2020 2:00 PM Medical Record Number: [id number] Patient Account Number: [id number] Date of Birth/Sex: Treating RN: 06-28-1940 (79 y.o. [name] Primary Care Alisi Lupien: Shon Baton Other Clinician: Referring Myleka Moncure: Treating [place] Strieter/Extender: Lowella Fairy in Treatment: 3 Active Problems Location of Pain Severity and Description of Pain Patient Has Paino No Site Locations Pain Management and Medication Current Pain Management: Electronic Signature(s) Signed: 02/16/2020 6:14:29 PM By: [name] RN, BSN Signed: 02/17/2020 1:11:06 PM By: Sandre Kitty Entered By: Sandre Kitty on 02/16/2020 14:47:32 -------------------------------------------------------------------------------- Patient/Caregiver Education Details Patient Name: Date of Service: UPTO [name] 10/6/2021andnbsp2:00 PM Medical Record Number: [id number] Patient Account Number: [id number] Date of Birth/Gender: Treating RN: 07-25-1940 (79 y.o. [name] Primary Care Physician: Shon Baton Other Clinician: Referring Physician: Treating Physician/Extender: Lowella Fairy in Treatment: 3 Education Assessment Education Provided To: Patient Education Topics Provided Elevated Blood Sugar/ Impact on Healing: Methods: Explain/Verbal Responses: Reinforcements needed, State content correctly Wound/Skin Impairment: Methods: Explain/Verbal Responses: Reinforcements needed, State content correctly Electronic Signature(s) Signed: 02/16/2020 6:14:29 PM By: [name] RN, BSN Entered By: [name] on 02/16/2020 15:12:48 -------------------------------------------------------------------------------- Wound Assessment Details Patient Name: Date of Service: UPTO Kelly Fila B. 02/16/2020 2:00 PM Medical Record Number: [id number] Patient Account Number: [id number] Date of Birth/Sex: Treating  RN: 1941-01-05 (79 y.o. [name], [name] Primary Care Kasen Adduci: Shon Baton Other Clinician: Referring Marriana Hibberd: Treating Job Holtsclaw/Extender: Lowella Fairy in Treatment: 3 Wound Status Wound Number: 1 Primary Venous Leg Ulcer Etiology: Wound Location: Left, Anterior Lower Leg Wound Open Wounding Event: Trauma Status: Date Acquired: 10/12/2019 Comorbid Anemia, Deep Vein Thrombosis, Hypertension, Received Weeks Of Treatment: 3 History: Chemotherapy, Received Radiation Clustered Wound: No Photos Photo Uploaded By: Mikeal Hawthorne on 02/17/2020 12:07:46 Wound Measurements Length: (cm) 1.1 Width: (cm) 0.7 Depth: (cm) 0.1 Area: (cm) 0.605 Volume: (cm) 0.06 % Reduction in Area: -28.5% % Reduction in Volume: 57.4% Epithelialization: None Tunneling: No Undermining: No Wound Description Classification: Full Thickness Without Exposed Support Structures Wound Margin: Epibole Exudate Amount: None Present Foul Odor After Cleansing: No Slough/Fibrino No Wound Bed Granulation Amount: None Present (0%) Exposed Structure Necrotic Amount: Large (67-100%) Fascia Exposed: No Necrotic Quality: Eschar Fat Layer (Subcutaneous Tissue) Exposed: Yes Tendon Exposed: No Muscle Exposed: No Joint Exposed: No Bone Exposed: No Treatment Notes Wound #1 (Left, Anterior Lower Leg) 1. Cleanse With Wound Cleanser 3. Primary Dressing Applied Collegen AG 4. Secondary Dressing Drawtex Foam Border Dressing Electronic Signature(s) Signed: 02/16/2020 5:54:43 PM By: Deon Pilling Signed: 02/16/2020 6:14:29 PM By: [name] RN, BSN Entered By: Deon Pilling on 02/16/2020 14:54:12 --------------------------------------------------------------------------------  Vitals Details Patient Name: Date of Service: UPTO [name] 02/16/2020 2:00 PM Medical Record Number: [id number] Patient Account Number: [id number] Date of Birth/Sex: Treating RN: [birth year] (79 y.o. [name] Primary Care Nelani Schmelzle: Shon Baton Other Clinician: Referring Demitris Pokorny: Treating Gal Smolinski/Extender: Lowella Fairy in Treatment: 3 Vital Signs Time Taken: 14:47 Temperature (F): 98.0 Height (in): 59 Pulse (bpm): 70 Weight (lbs): 108 Respiratory Rate (breaths/min): 18 Body Mass Index (BMI): 21.8 Blood Pressure (mmHg): 189/88 Reference Range: 80 - 120 mg / dl Electronic Signature(s) Signed: 02/17/2020 1:11:06 PM By: Sandre Kitty Entered By: Sandre Kitty on 02/16/2020 14:47:27

## 2020-02-18 DIAGNOSIS — I129 Hypertensive chronic kidney disease with stage 1 through stage 4 chronic kidney disease, or unspecified chronic kidney disease: Secondary | ICD-10-CM | POA: Diagnosis not present

## 2020-02-18 DIAGNOSIS — I2510 Atherosclerotic heart disease of native coronary artery without angina pectoris: Secondary | ICD-10-CM | POA: Diagnosis not present

## 2020-02-18 DIAGNOSIS — Z7901 Long term (current) use of anticoagulants: Secondary | ICD-10-CM | POA: Diagnosis not present

## 2020-02-18 DIAGNOSIS — I82401 Acute embolism and thrombosis of unspecified deep veins of right lower extremity: Secondary | ICD-10-CM | POA: Diagnosis not present

## 2020-02-18 DIAGNOSIS — N1831 Chronic kidney disease, stage 3a: Secondary | ICD-10-CM | POA: Diagnosis not present

## 2020-02-28 ENCOUNTER — Telehealth: Payer: Self-pay

## 2020-02-28 NOTE — Telephone Encounter (Signed)
She called and left a message that she needs to change her appt with Dr. Alvy Bimler due to a funeral.  Called back and moved appt to 11/2 at 2 pm. She is aware of the new date/ time.

## 2020-03-01 ENCOUNTER — Other Ambulatory Visit: Payer: Self-pay

## 2020-03-01 ENCOUNTER — Encounter (HOSPITAL_BASED_OUTPATIENT_CLINIC_OR_DEPARTMENT_OTHER): Payer: Medicare Other | Admitting: Physician Assistant

## 2020-03-01 DIAGNOSIS — F0150 Vascular dementia without behavioral disturbance: Secondary | ICD-10-CM | POA: Diagnosis not present

## 2020-03-01 DIAGNOSIS — I872 Venous insufficiency (chronic) (peripheral): Secondary | ICD-10-CM | POA: Diagnosis not present

## 2020-03-01 DIAGNOSIS — Z853 Personal history of malignant neoplasm of breast: Secondary | ICD-10-CM | POA: Diagnosis not present

## 2020-03-01 DIAGNOSIS — L97822 Non-pressure chronic ulcer of other part of left lower leg with fat layer exposed: Secondary | ICD-10-CM | POA: Diagnosis not present

## 2020-03-01 DIAGNOSIS — Z7901 Long term (current) use of anticoagulants: Secondary | ICD-10-CM | POA: Diagnosis not present

## 2020-03-01 DIAGNOSIS — Z86718 Personal history of other venous thrombosis and embolism: Secondary | ICD-10-CM | POA: Diagnosis not present

## 2020-03-01 NOTE — Progress Notes (Signed)
[name], HARTUNG ([id number]) Visit Report for 03/01/2020 Arrival Information Details Patient Name: Date of Service: UPTO [name] 03/01/2020 3:00 PM Medical Record Number: [id number] Patient Account Number: [id number] Date of Birth/Sex: Treating RN: [birth year] (79 y.o. [name], Tammi Klippel Primary Care Uno Esau: Shon Baton Other Clinician: Referring Everleigh Colclasure: Treating Matea Stanard/Extender: Lowella Fairy in Treatment: 5 Visit Information History Since Last Visit Added or deleted any medications: No Patient Arrived: Ambulatory Any new allergies or adverse reactions: No Arrival Time: 15:20 Had a fall or experienced change in No Accompanied By: husband activities of daily living that may affect Transfer Assistance: None risk of falls: Patient Identification Verified: Yes Signs or symptoms of abuse/neglect since last visito No Secondary Verification Process Completed: Yes Hospitalized since last visit: No Patient Requires Transmission-Based Precautions: No Implantable device outside of the clinic excluding No Patient Has Alerts: Yes cellular tissue based products placed in the center Patient Alerts: Patient on Blood Thinner since last visit: Has Dressing in Place as Prescribed: Yes Pain Present Now: No Electronic Signature(s) Signed: 03/01/2020 4:36:52 PM By: Deon Pilling Entered By: Deon Pilling on 03/01/2020 15:20:58 -------------------------------------------------------------------------------- Clinic Level of Care Assessment Details Patient Name: Date of Service: UPTO [name] 03/01/2020 3:00 PM Medical Record Number: [id number] Patient Account Number: [id number] Date of Birth/Sex: Treating RN: June 01, 1940 (79 y.o. [name], [name] Primary Care Lajean Boese: Shon Baton Other Clinician: Referring Jenness Sandoval: Treating [name]/Extender: Lowella Fairy in Treatment: 5 Clinic Level of Care Assessment Items TOOL 4 Quantity Score []  -  0 Use when only an EandM is performed on FOLLOW-UP visit ASSESSMENTS - Nursing Assessment / Reassessment X- 1 10 Reassessment of Co-morbidities (includes updates in patient status) X- 1 5 Reassessment of Adherence to Treatment Plan ASSESSMENTS - Wound and Skin A ssessment / Reassessment X - Simple Wound Assessment / Reassessment - one wound 1 5 []  - 0 Complex Wound Assessment / Reassessment - multiple wounds []  - 0 Dermatologic / Skin Assessment (not related to wound area) ASSESSMENTS - Focused Assessment []  - 0 Circumferential Edema Measurements - multi extremities []  - 0 Nutritional Assessment / Counseling / Intervention X- 1 5 Lower Extremity Assessment (monofilament, tuning fork, pulses) []  - 0 Peripheral Arterial Disease Assessment (using hand held doppler) ASSESSMENTS - Ostomy and/or Continence Assessment and Care []  - 0 Incontinence Assessment and Management []  - 0 Ostomy Care Assessment and Management (repouching, etc.) PROCESS - Coordination of Care X - Simple Patient / Family Education for ongoing care 1 15 []  - 0 Complex (extensive) Patient / Family Education for ongoing care X- 1 10 Staff obtains Programmer, systems, Records, T Results / Process Orders est []  - 0 Staff telephones HHA, Nursing Homes / Clarify orders / etc []  - 0 Routine Transfer to another Facility (non-emergent condition) []  - 0 Routine Hospital Admission (non-emergent condition) []  - 0 New Admissions / Biomedical engineer / Ordering NPWT Apligraf, etc. , []  - 0 Emergency Hospital Admission (emergent condition) X- 1 10 Simple Discharge Coordination []  - 0 Complex (extensive) Discharge Coordination PROCESS - Special Needs []  - 0 Pediatric / Minor Patient Management []  - 0 Isolation Patient Management []  - 0 Hearing / Language / Visual special needs []  - 0 Assessment of Community assistance (transportation, D/C planning, etc.) []  - 0 Additional assistance / Altered mentation []  -  0 Support Surface(s) Assessment (bed, cushion, seat, etc.) INTERVENTIONS - Wound Cleansing / Measurement X - Simple Wound Cleansing - one wound 1 5 []  -  0 Complex Wound Cleansing - multiple wounds X- 1 5 Wound Imaging (photographs - any number of wounds) $RemoveBe'[]'BIwaYIuPE$  - 0 Wound Tracing (instead of photographs) X- 1 5 Simple Wound Measurement - one wound $RemoveB'[]'bgsiPLEI$  - 0 Complex Wound Measurement - multiple wounds INTERVENTIONS - Wound Dressings X - Small Wound Dressing one or multiple wounds 1 10 $Re'[]'RRj$  - 0 Medium Wound Dressing one or multiple wounds $RemoveBeforeD'[]'KppGaRBvwRMAgq$  - 0 Large Wound Dressing one or multiple wounds X- 1 5 Application of Medications - topical $RemoveB'[]'iOIsthbl$  - 0 Application of Medications - injection INTERVENTIONS - Miscellaneous $RemoveBeforeD'[]'rexGihDMmluStg$  - 0 External ear exam $Remove'[]'UVoKlxO$  - 0 Specimen Collection (cultures, biopsies, blood, body fluids, etc.) $RemoveBefor'[]'oycTPXAoIgHg$  - 0 Specimen(s) / Culture(s) sent or taken to Lab for analysis $RemoveBefo'[]'LKGnCkCVwvd$  - 0 Patient Transfer (multiple staff / Civil Service fast streamer / Similar devices) $RemoveBeforeDE'[]'cnengLUENRnHEdk$  - 0 Simple Staple / Suture removal (25 or less) $Remove'[]'PUnWleW$  - 0 Complex Staple / Suture removal (26 or more) $Remove'[]'NJNgOKx$  - 0 Hypo / Hyperglycemic Management (close monitor of Blood Glucose) $RemoveBefore'[]'gGxgxuWKFTkzV$  - 0 Ankle / Brachial Index (ABI) - do not check if billed separately X- 1 5 Vital Signs Has the patient been seen at the hospital within the last three years: Yes Total Score: 95 Level Of Care: New/Established - Level 3 Electronic Signature(s) Signed: 03/01/2020 4:55:29 PM By: [name] RN, BSN Entered By: [name] on 03/01/2020 15:47:33 -------------------------------------------------------------------------------- Encounter Discharge Information Details Patient Name: Date of Service: UPTO [name] 03/01/2020 3:00 PM Medical Record Number: [id number] Patient Account Number: [id number] Date of Birth/Sex: Treating RN: 1940-11-21 (79 y.o. [name] Primary Care Mickie Badders: Shon Baton Other Clinician: Referring Astria Jordahl: Treating  Phares Zaccone/Extender: Lowella Fairy in Treatment: 5 Encounter Discharge Information Items Discharge Condition: Stable Ambulatory Status: Ambulatory Discharge Destination: Home Transportation: Private Auto Accompanied By: husband Schedule Follow-up Appointment: Yes Clinical Summary of Care: Patient Declined Electronic Signature(s) Signed: 03/01/2020 4:37:14 PM By: Kela Millin Entered By: Kela Millin on 03/01/2020 16:32:38 -------------------------------------------------------------------------------- Lower Extremity Assessment Details Patient Name: Date of Service: UPTO [name] 03/01/2020 3:00 PM Medical Record Number: [id number] Patient Account Number: [id number] Date of Birth/Sex: Treating RN: [birth year] (79 y.o. [name], Tammi Klippel Primary Care Luisangel Wainright: Shon Baton Other Clinician: Referring Jonita Hirota: Treating Mikayah Joy/Extender: Lowella Fairy in Treatment: 5 Edema Assessment Assessed: Shirlyn Goltz: Yes] [Right: No] Edema: [Left: Ye] [Right: s] Calf Left: Right: Point of Measurement: 25 cm From Medial Instep 33 cm Ankle Left: Right: Point of Measurement: 7 cm From Medial Instep 23 cm Vascular Assessment Pulses: Dorsalis Pedis Palpable: [Left:Yes] Electronic Signature(s) Signed: 03/01/2020 4:36:52 PM By: Deon Pilling Entered By: Deon Pilling on 03/01/2020 15:22:35 -------------------------------------------------------------------------------- [place] Details Patient Name: Date of Service: UPTO [name] 03/01/2020 3:00 PM Medical Record Number: [id number] Patient Account Number: [id number] Date of Birth/Sex: Treating RN: Sep 16, 1940 (79 y.o. [name] Primary Care Gotham Raden: Shon Baton Other Clinician: Referring Jazzie Trampe: Treating Ilisha Blust/Extender: Lowella Fairy in Treatment: 5 Active Inactive Venous Leg Ulcer Nursing Diagnoses: Knowledge deficit related to  disease process and management Potential for venous Insuffiency (use before diagnosis confirmed) Goals: Patient will maintain optimal edema control Date Initiated: 01/26/2020 Target Resolution Date: 03/29/2020 Goal Status: Active Interventions: Assess peripheral edema status every visit. Compression as ordered Treatment Activities: T ordered outside of clinic : 01/26/2020 est Therapeutic compression applied : 01/26/2020 Notes: Wound/Skin Impairment Nursing Diagnoses: Impaired tissue integrity Knowledge deficit related to ulceration/compromised skin integrity Goals: Patient/caregiver will verbalize  understanding of skin care regimen Date Initiated: 01/26/2020 Target Resolution Date: 03/29/2020 Goal Status: Active Ulcer/skin breakdown will have a volume reduction of 30% by week 4 Date Initiated: 01/26/2020 Date Inactivated: 03/01/2020 Target Resolution Date: 02/25/2020 Goal Status: Met Ulcer/skin breakdown will have a volume reduction of 50% by week 8 Date Initiated: 03/01/2020 Target Resolution Date: 03/29/2020 Goal Status: Active Interventions: Assess patient/caregiver ability to obtain necessary supplies Assess patient/caregiver ability to perform ulcer/skin care regimen upon admission and as needed Assess ulceration(s) every visit Provide education on ulcer and skin care Treatment Activities: Skin care regimen initiated : 01/26/2020 Topical wound management initiated : 01/26/2020 Notes: Electronic Signature(s) Signed: 03/01/2020 4:55:29 PM By: [name] RN, BSN Entered By: [name] on 03/01/2020 15:46:43 -------------------------------------------------------------------------------- Pain Assessment Details Patient Name: Date of Service: UPTO [name] 03/01/2020 3:00 PM Medical Record Number: [id number] Patient Account Number: [id number] Date of Birth/Sex: Treating RN: [birth year] (79 y.o. Debby Bud Primary Care Sevastian Witczak: Shon Baton Other  Clinician: Referring Gola Bribiesca: Treating Harm Jou/Extender: Lowella Fairy in Treatment: 5 Active Problems Location of Pain Severity and Description of Pain Patient Has Paino No Site Locations Rate the pain. Current Pain Level: 0 Pain Management and Medication Current Pain Management: Medication: No Cold Application: No Rest: No Massage: No Activity: No T.E.N.S.: No Heat Application: No Leg drop or elevation: No Is the Current Pain Management Adequate: Adequate How does your wound impact your activities of daily livingo Sleep: No Bathing: No Appetite: No Relationship With Others: No Bladder Continence: No Emotions: No Bowel Continence: No Work: No Toileting: No Drive: No Dressing: No Hobbies: No Electronic Signature(s) Signed: 03/01/2020 4:36:52 PM By: Deon Pilling Entered By: Deon Pilling on 03/01/2020 15:21:18 -------------------------------------------------------------------------------- Patient/Caregiver Education Details Patient Name: Date of Service: UPTO [name] 10/20/2021andnbsp3:00 PM Medical Record Number: [id number] Patient Account Number: [id number] Date of Birth/Gender: Treating RN: 1940/12/02 (79 y.o. [name] Primary Care Physician: Shon Baton Other Clinician: Referring Physician: Treating Physician/Extender: Lowella Fairy in Treatment: 5 Education Assessment Education Provided To: Patient Education Topics Provided Wound/Skin Impairment: Methods: Explain/Verbal Responses: Reinforcements needed, State content correctly Electronic Signature(s) Signed: 03/01/2020 4:55:29 PM By: [name] RN, BSN Entered By: [name] on 03/01/2020 15:47:03 -------------------------------------------------------------------------------- Wound Assessment Details Patient Name: Date of Service: UPTO [name] 03/01/2020 3:00 PM Medical Record Number: [id number] Patient Account Number:  [id number] Date of Birth/Sex: Treating RN: 16-Mar-1941 (79 y.o. [name], Meta.Reding Primary Care Adesuwa Osgood: Shon Baton Other Clinician: Referring Jerod Mcquain: Treating Fabiola Mudgett/Extender: Lowella Fairy in Treatment: 5 Wound Status Wound Number: 1 Primary Venous Leg Ulcer Etiology: Wound Location: Left, Anterior Lower Leg Wound Open Wounding Event: Trauma Status: Date Acquired: 10/12/2019 Comorbid Anemia, Deep Vein Thrombosis, Hypertension, Received Weeks Of Treatment: 5 History: Chemotherapy, Received Radiation Clustered Wound: No Wound Measurements Length: (cm) 0.1 Width: (cm) 0.1 Depth: (cm) 0.2 Area: (cm) 0.008 Volume: (cm) 0.002 % Reduction in Area: 98.3% % Reduction in Volume: 98.6% Epithelialization: Large (67-100%) Tunneling: No Undermining: No Wound Description Classification: Full Thickness Without Exposed Support Structures Wound Margin: Epibole Exudate Amount: Small Exudate Type: Serosanguineous Exudate Color: red, brown Foul Odor After Cleansing: No Slough/Fibrino Yes Wound Bed Granulation Amount: Large (67-100%) Exposed Structure Granulation Quality: Pink, Pale Fascia Exposed: No Necrotic Amount: Small (1-33%) Fat Layer (Subcutaneous Tissue) Exposed: Yes Necrotic Quality: Adherent Slough Tendon Exposed: No Muscle Exposed: No Joint Exposed: No Bone Exposed: No Treatment Notes Wound #1 (Left, Anterior Lower Leg)  1. Cleanse With Wound Cleanser 2. Periwound Care Skin Prep 3. Primary Dressing Applied Calcium Alginate 4. Secondary Dressing Foam Border Dressing Electronic Signature(s) Signed: 03/01/2020 4:36:52 PM By: Deon Pilling Entered By: Deon Pilling on 03/01/2020 15:27:44 -------------------------------------------------------------------------------- Vitals Details Patient Name: Date of Service: UPTO [name] 03/01/2020 3:00 PM Medical Record Number: [id number] Patient Account Number: [id number] Date of  Birth/Sex: Treating RN: November 19, 1940 (79 y.o. [name], Tammi Klippel Primary Care Kaynen Minner: Shon Baton Other Clinician: Referring Faizaan [place]: Treating Jacion Dismore/Extender: Lowella Fairy in Treatment: 5 Vital Signs Time Taken: 15:25 Temperature (F): 98.3 Height (in): 59 Pulse (bpm): 80 Weight (lbs): 108 Respiratory Rate (breaths/min): 18 Body Mass Index (BMI): 21.8 Blood Pressure (mmHg): 144/82 Reference Range: 80 - 120 mg / dl Electronic Signature(s) Signed: 03/01/2020 4:36:52 PM By: Deon Pilling Entered By: Deon Pilling on 03/01/2020 15:28:13

## 2020-03-01 NOTE — Progress Notes (Addendum)
[name] ([id number]) Visit Report for 03/01/2020 Chief Complaint Document Details Patient Name: Date of Service: UPTO [name] 03/01/2020 3:00 PM Medical Record Number: [id number] Patient Account Number: [id number] Date of Birth/Sex: Treating RN: May 06, 1941 (79 y.o. [name] Primary Care Provider: Shon Sandoval Other Clinician: Referring Provider: Treating Provider/Extender: [name] in Treatment: 5 Information Obtained from: Patient Chief Complaint Left LE Ulcer Electronic Signature(s) Signed: 03/01/2020 3:15:08 PM By: [name] PA-C Entered By: [name] on 03/01/2020 15:15:08 -------------------------------------------------------------------------------- HPI Details Patient Name: Date of Service: UPTO Kelly Fila B. 03/01/2020 3:00 PM Medical Record Number: [id number] Patient Account Number: [id number] Date of Birth/Sex: Treating RN: 1941-02-26 (79 y.o. [name] Primary Care Provider: Shon Sandoval Other Clinician: Referring Provider: Treating Provider/Extender: [name] in Treatment: 5 History of Present Illness HPI Description: 01/26/2020 on evaluation today patient presents for initial evaluation here in our clinic concerning issues she has been having with a wound over her shin of the left leg. She tells me that this occurred near the beginning of June 2021 when she scraped her leg on something that she would not disclose. With that being said she tells me that she has been using a DuoDERM dressing over this as recommended unfortunately that really has not made a big improvement. It is possible based on what I am seeing that she likely had a hematoma which led to a deeper wound which is why it really has not healed as much as we would like to have seen up to this point. Fortunately there is no signs of active infection at this point she has completed roughly 2 weeks ago a 2- week  prescription of doxycycline. The patient is currently on Eliquis due to a history of DVT She also has cancer that the chemotherapy is on hold right now I s. do not believe that the chemotherapy was likely providing a huge issue as far as redness from healed lesion I think it is probably more likely that she does has a deeper wound than what is obvious and that needs to be addressed. Patient also has a history of multiple sites of carcinoma including her breast among other things. She also has mild dementia for which she is on medication. 02/09/2020 on evaluation today patient appears to be doing decently well in regard to her leg ulcer. This is looking a little bit better as far as the overall cleanliness of the wound is concerned today. With that being said there does not appear to be any sign of active infection at this time which is great news. I do believe with the alginate they done better than with the DuoDERM previous. 02/16/2020 on evaluation today patient appears to actually be doing okay in regard to her wound. She does have a little bit of a scab over this area which I am afraid is actually collecting some fluid underneath initially her and her husband were wanting to not remove this. With that being said I feel like it is good to be of utmost importance to remove it as I do believe should be trapping fluid underneath. In the end they agreed to do what I felt was best. 03/01/2020 on evaluation today patient actually appears to be doing excellent infection appears to be from intensive purposes healed. I do not see any obvious signs of an open wound at this time though nonetheless I do believe she does likely still have  a small open area remaining at this point. There is no evidence of active infection currently which is great news. No fevers, chills, nausea, vomiting, or diarrhea. Electronic Signature(s) Signed: 03/01/2020 3:52:22 PM By: [name] PA-C Entered By: [name] on  03/01/2020 15:52:22 -------------------------------------------------------------------------------- Physical Exam Details Patient Name: Date of Service: UPTO Kelly Fila B. 03/01/2020 3:00 PM Medical Record Number: [id number] Patient Account Number: [id number] Date of Birth/Sex: Treating RN: December 23, 1940 (79 y.o. [name] Primary Care Provider: Shon Sandoval Other Clinician: Referring Provider: Treating Provider/Extender: [name] in Treatment: 5 Constitutional Well-nourished and well-hydrated in no acute distress. Respiratory normal breathing without difficulty. Psychiatric this patient is able to make decisions and demonstrates good insight into disease process. Alert and Oriented x 3. pleasant and cooperative. Notes Patient's wound bed actually showed signs of good granulation at this time. There does not appear to be any evidence of active infection which is great news and overall I am extremely pleased with where things stand. Electronic Signature(s) Signed: 03/01/2020 3:52:37 PM By: [name] PA-C Entered By: [name] on 03/01/2020 15:52:36 -------------------------------------------------------------------------------- Physician Orders Details Patient Name: Date of Service: UPTO Kelly Fila B. 03/01/2020 3:00 PM Medical Record Number: [id number] Patient Account Number: [id number] Date of Birth/Sex: Treating RN: [birth year] (79 y.o. [name] Primary Care Provider: Shon Sandoval Other Clinician: Referring Provider: Treating Provider/Extender: [name] in Treatment: 5 Verbal / Phone Orders: No Diagnosis Coding ICD-10 Coding Code Description I87.2 Venous insufficiency (chronic) (peripheral) L97.822 Non-pressure chronic ulcer of other part of left lower leg with fat layer exposed F01.50 Vascular dementia without behavioral disturbance Z85.9 Personal history of malignant neoplasm,  unspecified Follow-up Appointments Return Appointment in 2 weeks. Dressing Change Frequency Wound #1 Left,Anterior Lower Leg Change Dressing every other day. Wound Cleansing Wound #1 Left,Anterior Lower Leg May shower with protection. - on days you do not change dressing May shower and wash wound with soap and water. - with dressing changes Primary Wound Dressing Wound #1 Left,Anterior Lower Leg Calcium Alginate Secondary Dressing Wound #1 Left,Anterior Lower Leg Foam Border Edema Control Avoid standing for long periods of time Elevate legs to the level of the heart or above for 30 minutes daily and/or when sitting, a frequency of: Exercise regularly Electronic Signature(s) Signed: 03/01/2020 4:35:17 PM By: [name] PA-C Signed: 03/01/2020 4:55:29 PM By: [name] RN, BSN Entered By: [name] on 03/01/2020 15:48:15 -------------------------------------------------------------------------------- Problem List Details Patient Name: Date of Service: UPTO Kelly Fila B. 03/01/2020 3:00 PM Medical Record Number: [id number] Patient Account Number: [id number] Date of Birth/Sex: Treating RN: 1940/08/06 (79 y.o. [name] Primary Care Provider: Shon Sandoval Other Clinician: Referring Provider: Treating Provider/Extender: [name] in Treatment: 5 Active Problems ICD-10 Encounter Code Description Active Date MDM Diagnosis I87.2 Venous insufficiency (chronic) (peripheral) 01/26/2020 No Yes L97.822 Non-pressure chronic ulcer of other part of left lower leg with fat layer exposed9/15/2021 No Yes F01.50 Vascular dementia without behavioral disturbance 01/26/2020 No Yes Z85.9 Personal history of malignant neoplasm, unspecified 01/26/2020 No Yes Inactive Problems Resolved Problems Electronic Signature(s) Signed: 03/01/2020 3:15:01 PM By: [name] PA-C Entered By: [name] on 03/01/2020  15:15:01 -------------------------------------------------------------------------------- Progress Note Details Patient Name: Date of Service: UPTO Kelly Fila B. 03/01/2020 3:00 PM Medical Record Number: [id number] Patient Account Number: [id number] Date of Birth/Sex: Treating RN: February 05, 1941 (79 y.o. [name] Primary Care Provider: Virgina Jock,  John Other Clinician: Referring Provider: Treating Provider/Extender: [name] in Treatment: 5 Subjective Chief Complaint Information obtained from Patient Left LE Ulcer History of Present Illness (HPI) 01/26/2020 on evaluation today patient presents for initial evaluation here in our clinic concerning issues she has been having with a wound over her shin of the left leg. She tells me that this occurred near the beginning of June 2021 when she scraped her leg on something that she would not disclose. With that being said she tells me that she has been using a DuoDERM dressing over this as recommended unfortunately that really has not made a big improvement. It is possible based on what I am seeing that she likely had a hematoma which led to a deeper wound which is why it really has not healed as much as we would like to have seen up to this point. Fortunately there is no signs of active infection at this point she has completed roughly 2 weeks ago a 2-week prescription of doxycycline. The patient is currently on Eliquis due to a history of DVT She also has cancer that the chemotherapy is on hold right now I do not believe that s. the chemotherapy was likely providing a huge issue as far as redness from healed lesion I think it is probably more likely that she does has a deeper wound than what is obvious and that needs to be addressed. Patient also has a history of multiple sites of carcinoma including her breast among other things. She also has mild dementia for which she is on medication. 02/09/2020 on evaluation today  patient appears to be doing decently well in regard to her leg ulcer. This is looking a little bit better as far as the overall cleanliness of the wound is concerned today. With that being said there does not appear to be any sign of active infection at this time which is great news. I do believe with the alginate they done better than with the DuoDERM previous. 02/16/2020 on evaluation today patient appears to actually be doing okay in regard to her wound. She does have a little bit of a scab over this area which I am afraid is actually collecting some fluid underneath initially her and her husband were wanting to not remove this. With that being said I feel like it is good to be of utmost importance to remove it as I do believe should be trapping fluid underneath. In the end they agreed to do what I felt was best. 03/01/2020 on evaluation today patient actually appears to be doing excellent infection appears to be from intensive purposes healed. I do not see any obvious signs of an open wound at this time though nonetheless I do believe she does likely still have a small open area remaining at this point. There is no evidence of active infection currently which is great news. No fevers, chills, nausea, vomiting, or diarrhea. Objective Constitutional Well-nourished and well-hydrated in no acute distress. Vitals Time Taken: 3:25 PM, Height: 59 in, Weight: 108 lbs, BMI: 21.8, Temperature: 98.3 F, Pulse: 80 bpm, Respiratory Rate: 18 breaths/min, Blood Pressure: 144/82 mmHg. Respiratory normal breathing without difficulty. Psychiatric this patient is able to make decisions and demonstrates good insight into disease process. Alert and Oriented x 3. pleasant and cooperative. General Notes: Patient's wound bed actually showed signs of good granulation at this time. There does not appear to be any evidence of active infection which is great news and overall I  am extremely pleased with where things  stand. Integumentary (Hair, Skin) Wound #1 status is Open. Original cause of wound was Trauma. The wound is located on the Left,Anterior Lower Leg. The wound measures 0.1cm length x 0.1cm width x 0.2cm depth; 0.008cm^2 area and 0.002cm^3 volume. There is Fat Layer (Subcutaneous Tissue) exposed. There is no tunneling or undermining noted. There is a small amount of serosanguineous drainage noted. The wound margin is epibole. There is large (67-100%) pink, pale granulation within the wound bed. There is a small (1-33%) amount of necrotic tissue within the wound bed including Adherent Slough. Assessment Active Problems ICD-10 Venous insufficiency (chronic) (peripheral) Non-pressure chronic ulcer of other part of left lower leg with fat layer exposed Vascular dementia without behavioral disturbance Personal history of malignant neoplasm, unspecified Plan Follow-up Appointments: Return Appointment in 2 weeks. Dressing Change Frequency: Wound #1 Left,Anterior Lower Leg: Change Dressing every other day. Wound Cleansing: Wound #1 Left,Anterior Lower Leg: May shower with protection. - on days you do not change dressing May shower and wash wound with soap and water. - with dressing changes Primary Wound Dressing: Wound #1 Left,Anterior Lower Leg: Calcium Alginate Secondary Dressing: Wound #1 Left,Anterior Lower Leg: Foam Border Edema Control: Avoid standing for long periods of time Elevate legs to the level of the heart or above for 30 minutes daily and/or when sitting, a frequency of: Exercise regularly 1. I am going to suggest that we going to continue with the wound care measures as before and the patient is in agreement with that plan. This is going to include put a protective dressing over we will switch over to alginate in order to protect the area and keep it dry I think it is likely healed but I want to just monitor and make sure nothing changes over the next couple weeks. 2. I am  also can recommend that this be changed every few days obviously I think that there is no set timing of when this has to be done. 3. I am also can recommend that we continue with monitoring we will recheck with her in 2 weeks and see where things stand. We will see patient back for reevaluation in 2 weeks here in the clinic. If anything worsens or changes patient will contact our office for additional recommendations. Electronic Signature(s) Signed: 03/01/2020 3:53:25 PM By: [name] PA-C Entered By: [name] on 03/01/2020 15:53:25 -------------------------------------------------------------------------------- SuperBill Details Patient Name: Date of Service: UPTO [name] 03/01/2020 Medical Record Number: [id number] Patient Account Number: [id number] Date of Birth/Sex: Treating RN: 1941/02/16 (79 y.o. [name], [name] Primary Care Provider: Shon Sandoval Other Clinician: Referring Provider: Treating Provider/Extender: [name] in Treatment: 5 Diagnosis Coding ICD-10 Codes Code Description I87.2 Venous insufficiency (chronic) (peripheral) L97.822 Non-pressure chronic ulcer of other part of left lower leg with fat layer exposed F01.50 Vascular dementia without behavioral disturbance Z85.9 Personal history of malignant neoplasm, unspecified Facility Procedures CPT4 Code: [id number] Description: 99213 - WOUND CARE VISIT-LEV 3 EST PT Modifier: Quantity: 1 Physician Procedures : CPT4 Code Description Modifier [id number] - WC PHYS LEVEL 3 - EST PT ICD-10 Diagnosis Description I87.2 Venous insufficiency (chronic) (peripheral) L97.822 Non-pressure chronic ulcer of other part of left lower leg with fat layer exposed F01.50  Vascular dementia without behavioral disturbance Z85.9 Personal history of malignant neoplasm, unspecified Quantity: 1 Electronic Signature(s) Signed: 03/01/2020 3:53:38 PM By: [name] PA-C Entered By: [name]  on 03/01/2020 15:53:38

## 2020-03-03 DIAGNOSIS — Z1212 Encounter for screening for malignant neoplasm of rectum: Secondary | ICD-10-CM | POA: Diagnosis not present

## 2020-03-07 DIAGNOSIS — Z23 Encounter for immunization: Secondary | ICD-10-CM | POA: Diagnosis not present

## 2020-03-09 ENCOUNTER — Ambulatory Visit (HOSPITAL_COMMUNITY)
Admission: RE | Admit: 2020-03-09 | Discharge: 2020-03-09 | Disposition: A | Discharge status: Home / Self Care | Payer: Medicare Other | Source: Ambulatory Visit | Attending: Hematology and Oncology | Admitting: Hematology and Oncology

## 2020-03-09 ENCOUNTER — Inpatient Hospital Stay: Payer: Medicare Other

## 2020-03-09 ENCOUNTER — Other Ambulatory Visit: Payer: Self-pay

## 2020-03-09 ENCOUNTER — Telehealth: Payer: Self-pay | Admitting: *Deleted

## 2020-03-09 ENCOUNTER — Inpatient Hospital Stay: Payer: Medicare Other | Attending: Hematology and Oncology

## 2020-03-09 DIAGNOSIS — C549 Malignant neoplasm of corpus uteri, unspecified: Secondary | ICD-10-CM

## 2020-03-09 DIAGNOSIS — E039 Hypothyroidism, unspecified: Secondary | ICD-10-CM

## 2020-03-09 DIAGNOSIS — C541 Malignant neoplasm of endometrium: Secondary | ICD-10-CM | POA: Diagnosis not present

## 2020-03-09 DIAGNOSIS — C3491 Malignant neoplasm of unspecified part of right bronchus or lung: Secondary | ICD-10-CM | POA: Diagnosis present

## 2020-03-09 DIAGNOSIS — M899 Disorder of bone, unspecified: Secondary | ICD-10-CM | POA: Diagnosis not present

## 2020-03-09 DIAGNOSIS — L03116 Cellulitis of left lower limb: Secondary | ICD-10-CM

## 2020-03-09 DIAGNOSIS — C7951 Secondary malignant neoplasm of bone: Secondary | ICD-10-CM | POA: Diagnosis not present

## 2020-03-09 DIAGNOSIS — Z853 Personal history of malignant neoplasm of breast: Secondary | ICD-10-CM

## 2020-03-09 DIAGNOSIS — Z95828 Presence of other vascular implants and grafts: Secondary | ICD-10-CM

## 2020-03-09 DIAGNOSIS — D61818 Other pancytopenia: Secondary | ICD-10-CM | POA: Diagnosis not present

## 2020-03-09 DIAGNOSIS — Z7189 Other specified counseling: Secondary | ICD-10-CM | POA: Diagnosis not present

## 2020-03-09 DIAGNOSIS — C3490 Malignant neoplasm of unspecified part of unspecified bronchus or lung: Secondary | ICD-10-CM | POA: Diagnosis not present

## 2020-03-09 DIAGNOSIS — D701 Agranulocytosis secondary to cancer chemotherapy: Secondary | ICD-10-CM

## 2020-03-09 LAB — TSH: TSH: 1.261 u[IU]/mL (ref 0.308–3.960)

## 2020-03-09 LAB — CBC WITH DIFFERENTIAL (CANCER CENTER ONLY)
Abs Immature Granulocytes: 0.04 10*3/uL (ref 0.00–0.07)
Basophils Absolute: 0.1 10*3/uL (ref 0.0–0.1)
Basophils Relative: 1 %
Eosinophils Absolute: 0.2 10*3/uL (ref 0.0–0.5)
Eosinophils Relative: 3 %
HCT: 31.6 % — ABNORMAL LOW (ref 36.0–46.0)
Hemoglobin: 9.7 g/dL — ABNORMAL LOW (ref 12.0–15.0)
Immature Granulocytes: 1 %
Lymphocytes Relative: 11 %
Lymphs Abs: 0.7 10*3/uL (ref 0.7–4.0)
MCH: 28.1 pg (ref 26.0–34.0)
MCHC: 30.7 g/dL (ref 30.0–36.0)
MCV: 91.6 fL (ref 80.0–100.0)
Monocytes Absolute: 1.4 10*3/uL — ABNORMAL HIGH (ref 0.1–1.0)
Monocytes Relative: 22 %
Neutro Abs: 4.1 10*3/uL (ref 1.7–7.7)
Neutrophils Relative %: 62 %
Platelet Count: 159 10*3/uL (ref 150–400)
RBC: 3.45 MIL/uL — ABNORMAL LOW (ref 3.87–5.11)
RDW: 18.8 % — ABNORMAL HIGH (ref 11.5–15.5)
WBC Count: 6.5 10*3/uL (ref 4.0–10.5)
nRBC: 0 % (ref 0.0–0.2)

## 2020-03-09 LAB — CMP (CANCER CENTER ONLY)
ALT: 34 U/L (ref 0–44)
AST: 40 U/L (ref 15–41)
Albumin: 3.2 g/dL — ABNORMAL LOW (ref 3.5–5.0)
Alkaline Phosphatase: 52 U/L (ref 38–126)
Anion gap: 9 (ref 5–15)
BUN: 22 mg/dL (ref 8–23)
CO2: 26 mmol/L (ref 22–32)
Calcium: 9.2 mg/dL (ref 8.9–10.3)
Chloride: 107 mmol/L (ref 98–111)
Creatinine: 0.86 mg/dL (ref 0.44–1.00)
GFR, Estimated: >60 mL/min (ref >60)
Glucose, Bld: 79 mg/dL (ref 70–99)
Potassium: 3.8 mmol/L (ref 3.5–5.1)
Sodium: 142 mmol/L (ref 135–145)
Total Bilirubin: 0.6 mg/dL (ref 0.3–1.2)
Total Protein: 6.3 g/dL — ABNORMAL LOW (ref 6.5–8.1)

## 2020-03-09 LAB — SAMPLE TO BLOOD BANK

## 2020-03-09 LAB — GLUCOSE, CAPILLARY: Glucose-Capillary: 81 mg/dL (ref 70–99)

## 2020-03-09 LAB — T4, FREE: Free T4: 0.73 ng/dL (ref 0.61–1.12)

## 2020-03-09 LAB — TOTAL PROTEIN, URINE DIPSTICK: Protein, ur: NEGATIVE mg/dL

## 2020-03-09 MED ORDER — FLUDEOXYGLUCOSE F - 18 (FDG) INJECTION
5.5000 | Freq: Once | INTRAVENOUS | Status: AC | PRN
  Administered 2020-03-09: 5.5 via INTRAVENOUS

## 2020-03-09 MED ORDER — SODIUM CHLORIDE 0.9% FLUSH
10.0000 mL | Freq: Once | INTRAVENOUS | Status: AC
  Administered 2020-03-09: 10 mL
  Filled 2020-03-09: qty 10

## 2020-03-09 NOTE — Telephone Encounter (Signed)
Received message from [facility] that pt HGB 9.7.  Message to Dr Alvy Bimler.

## 2020-03-10 ENCOUNTER — Ambulatory Visit: Payer: Medicare Other | Admitting: Hematology and Oncology

## 2020-03-14 ENCOUNTER — Telehealth: Payer: Self-pay | Admitting: Hematology and Oncology

## 2020-03-14 ENCOUNTER — Inpatient Hospital Stay: Payer: Medicare Other | Attending: Hematology and Oncology | Admitting: Hematology and Oncology

## 2020-03-14 ENCOUNTER — Other Ambulatory Visit: Payer: Self-pay

## 2020-03-14 DIAGNOSIS — C55 Malignant neoplasm of uterus, part unspecified: Secondary | ICD-10-CM | POA: Diagnosis not present

## 2020-03-14 DIAGNOSIS — I700 Atherosclerosis of aorta: Secondary | ICD-10-CM | POA: Insufficient documentation

## 2020-03-14 DIAGNOSIS — C50919 Malignant neoplasm of unspecified site of unspecified female breast: Secondary | ICD-10-CM | POA: Diagnosis not present

## 2020-03-14 DIAGNOSIS — I82401 Acute embolism and thrombosis of unspecified deep veins of right lower extremity: Secondary | ICD-10-CM | POA: Diagnosis not present

## 2020-03-14 DIAGNOSIS — Z853 Personal history of malignant neoplasm of breast: Secondary | ICD-10-CM | POA: Insufficient documentation

## 2020-03-14 DIAGNOSIS — L03116 Cellulitis of left lower limb: Secondary | ICD-10-CM | POA: Diagnosis not present

## 2020-03-14 DIAGNOSIS — L97909 Non-pressure chronic ulcer of unspecified part of unspecified lower leg with unspecified severity: Secondary | ICD-10-CM | POA: Diagnosis not present

## 2020-03-14 DIAGNOSIS — C7951 Secondary malignant neoplasm of bone: Secondary | ICD-10-CM | POA: Insufficient documentation

## 2020-03-14 DIAGNOSIS — I2510 Atherosclerotic heart disease of native coronary artery without angina pectoris: Secondary | ICD-10-CM | POA: Diagnosis not present

## 2020-03-14 DIAGNOSIS — C3411 Malignant neoplasm of upper lobe, right bronchus or lung: Secondary | ICD-10-CM | POA: Insufficient documentation

## 2020-03-14 DIAGNOSIS — D638 Anemia in other chronic diseases classified elsewhere: Secondary | ICD-10-CM

## 2020-03-14 DIAGNOSIS — C3491 Malignant neoplasm of unspecified part of right bronchus or lung: Secondary | ICD-10-CM | POA: Diagnosis not present

## 2020-03-14 DIAGNOSIS — E46 Unspecified protein-calorie malnutrition: Secondary | ICD-10-CM | POA: Diagnosis not present

## 2020-03-14 DIAGNOSIS — Z7901 Long term (current) use of anticoagulants: Secondary | ICD-10-CM | POA: Insufficient documentation

## 2020-03-14 DIAGNOSIS — Z171 Estrogen receptor negative status [ER-]: Secondary | ICD-10-CM | POA: Insufficient documentation

## 2020-03-14 DIAGNOSIS — Z79899 Other long term (current) drug therapy: Secondary | ICD-10-CM | POA: Insufficient documentation

## 2020-03-14 DIAGNOSIS — I129 Hypertensive chronic kidney disease with stage 1 through stage 4 chronic kidney disease, or unspecified chronic kidney disease: Secondary | ICD-10-CM | POA: Diagnosis not present

## 2020-03-14 DIAGNOSIS — C3431 Malignant neoplasm of lower lobe, right bronchus or lung: Secondary | ICD-10-CM | POA: Diagnosis not present

## 2020-03-14 DIAGNOSIS — L97921 Non-pressure chronic ulcer of unspecified part of left lower leg limited to breakdown of skin: Secondary | ICD-10-CM | POA: Diagnosis not present

## 2020-03-14 DIAGNOSIS — R600 Localized edema: Secondary | ICD-10-CM

## 2020-03-14 DIAGNOSIS — R413 Other amnesia: Secondary | ICD-10-CM | POA: Diagnosis not present

## 2020-03-14 DIAGNOSIS — N1831 Chronic kidney disease, stage 3a: Secondary | ICD-10-CM | POA: Diagnosis not present

## 2020-03-14 DIAGNOSIS — Z8542 Personal history of malignant neoplasm of other parts of uterus: Secondary | ICD-10-CM | POA: Insufficient documentation

## 2020-03-14 DIAGNOSIS — C541 Malignant neoplasm of endometrium: Secondary | ICD-10-CM

## 2020-03-14 DIAGNOSIS — Z885 Allergy status to narcotic agent status: Secondary | ICD-10-CM | POA: Insufficient documentation

## 2020-03-14 NOTE — Telephone Encounter (Signed)
Scheduled appointments per 11/2 sch msg. Spoke to patient who is aware of appointment date and time.

## 2020-03-15 ENCOUNTER — Encounter (HOSPITAL_BASED_OUTPATIENT_CLINIC_OR_DEPARTMENT_OTHER): Payer: Medicare Other | Attending: Physician Assistant | Admitting: Physician Assistant

## 2020-03-15 ENCOUNTER — Encounter: Payer: Self-pay | Admitting: Hematology and Oncology

## 2020-03-15 DIAGNOSIS — L97822 Non-pressure chronic ulcer of other part of left lower leg with fat layer exposed: Secondary | ICD-10-CM | POA: Diagnosis not present

## 2020-03-15 NOTE — Progress Notes (Signed)
ERMINE, STEBBINS ([id number]) Visit Report for 03/15/2020 Arrival Information Details Patient Name: Date of Service: UPTO [name] 03/15/2020 1:00 PM Medical Record Number: [id number] Patient Account Number: [id number] Date of Birth/Sex: Treating RN: 1940/07/21 (79 y.o. Helene Shoe, Tammi Klippel Primary Care Bryannah Boston: Shon Baton Other Clinician: Referring Varetta Chavers: Treating Devondre Guzzetta/Extender: Lowella Fairy in Treatment: 7 Visit Information History Since Last Visit Added or deleted any medications: No Patient Arrived: Ambulatory Any new allergies or adverse reactions: No Arrival Time: 13:15 Had a fall or experienced change in No Accompanied By: husband activities of daily living that may affect Transfer Assistance: None risk of falls: Patient Identification Verified: Yes Signs or symptoms of abuse/neglect since last visito No Secondary Verification Process Completed: Yes Hospitalized since last visit: No Patient Requires Transmission-Based Precautions: No Implantable device outside of the clinic excluding No Patient Has Alerts: Yes cellular tissue based products placed in the center Patient Alerts: Patient on Blood Thinner since last visit: Has Dressing in Place as Prescribed: Yes Pain Present Now: No Electronic Signature(s) Signed: 03/15/2020 5:31:28 PM By: Deon Pilling Entered By: Deon Pilling on 03/15/2020 13:20:58 -------------------------------------------------------------------------------- Clinic Level of Care Assessment Details Patient Name: Date of Service: UPTO [name] 03/15/2020 1:00 PM Medical Record Number: [id number] Patient Account Number: [id number] Date of Birth/Sex: Treating RN: [birth year] (79 y.o. [name], [name] Primary Care Jakylan Ron: Shon Baton Other Clinician: Referring Sumayyah Custodio: Treating Grier Vu/Extender: Lowella Fairy in Treatment: 7 Clinic Level of Care Assessment Items TOOL 4 Quantity Score []  -  0 Use when only an EandM is performed on FOLLOW-UP visit ASSESSMENTS - Nursing Assessment / Reassessment X- 1 10 Reassessment of Co-morbidities (includes updates in patient status) X- 1 5 Reassessment of Adherence to Treatment Plan ASSESSMENTS - Wound and Skin A ssessment / Reassessment X - Simple Wound Assessment / Reassessment - one wound 1 5 []  - 0 Complex Wound Assessment / Reassessment - multiple wounds []  - 0 Dermatologic / Skin Assessment (not related to wound area) ASSESSMENTS - Focused Assessment []  - 0 Circumferential Edema Measurements - multi extremities []  - 0 Nutritional Assessment / Counseling / Intervention X- 1 5 Lower Extremity Assessment (monofilament, tuning fork, pulses) []  - 0 Peripheral Arterial Disease Assessment (using hand held doppler) ASSESSMENTS - Ostomy and/or Continence Assessment and Care []  - 0 Incontinence Assessment and Management []  - 0 Ostomy Care Assessment and Management (repouching, etc.) PROCESS - Coordination of Care X - Simple Patient / Family Education for ongoing care 1 15 []  - 0 Complex (extensive) Patient / Family Education for ongoing care X- 1 10 Staff obtains Programmer, systems, Records, T Results / Process Orders est []  - 0 Staff telephones HHA, Nursing Homes / Clarify orders / etc []  - 0 Routine Transfer to another Facility (non-emergent condition) []  - 0 Routine Hospital Admission (non-emergent condition) []  - 0 New Admissions / Biomedical engineer / Ordering NPWT Apligraf, etc. , []  - 0 Emergency Hospital Admission (emergent condition) X- 1 10 Simple Discharge Coordination []  - 0 Complex (extensive) Discharge Coordination PROCESS - Special Needs []  - 0 Pediatric / Minor Patient Management []  - 0 Isolation Patient Management []  - 0 Hearing / Language / Visual special needs []  - 0 Assessment of Community assistance (transportation, D/C planning, etc.) []  - 0 Additional assistance / Altered mentation []  -  0 Support Surface(s) Assessment (bed, cushion, seat, etc.) INTERVENTIONS - Wound Cleansing / Measurement X - Simple Wound Cleansing - one wound 1 5 []  -  0 Complex Wound Cleansing - multiple wounds X- 1 5 Wound Imaging (photographs - any number of wounds) []  - 0 Wound Tracing (instead of photographs) X- 1 5 Simple Wound Measurement - one wound []  - 0 Complex Wound Measurement - multiple wounds INTERVENTIONS - Wound Dressings []  - 0 Small Wound Dressing one or multiple wounds []  - 0 Medium Wound Dressing one or multiple wounds []  - 0 Large Wound Dressing one or multiple wounds []  - 0 Application of Medications - topical []  - 0 Application of Medications - injection INTERVENTIONS - Miscellaneous []  - 0 External ear exam []  - 0 Specimen Collection (cultures, biopsies, blood, body fluids, etc.) []  - 0 Specimen(s) / Culture(s) sent or taken to Lab for analysis []  - 0 Patient Transfer (multiple staff / Civil Service fast streamer / Similar devices) []  - 0 Simple Staple / Suture removal (25 or less) []  - 0 Complex Staple / Suture removal (26 or more) []  - 0 Hypo / Hyperglycemic Management (close monitor of Blood Glucose) []  - 0 Ankle / Brachial Index (ABI) - do not check if billed separately X- 1 5 Vital Signs Has the patient been seen at the hospital within the last three years: Yes Total Score: 80 Level Of Care: New/Established - Level 3 Electronic Signature(s) Signed: 03/15/2020 5:51:45 PM By: [name] RN, BSN Entered By: [name] on 03/15/2020 14:06:01 -------------------------------------------------------------------------------- Encounter Discharge Information Details Patient Name: Date of Service: UPTO [name] 03/15/2020 1:00 PM Medical Record Number: [id number] Patient Account Number: [id number] Date of Birth/Sex: Treating RN: 1941/03/10 (79 y.o. [name] Primary Care Dragon Thrush: Shon Baton Other Clinician: Referring Kitara Hebb: Treating  Larah Kuntzman/Extender: Lowella Fairy in Treatment: 7 Encounter Discharge Information Items Discharge Condition: Stable Ambulatory Status: Ambulatory Discharge Destination: Home Transportation: Private Auto Accompanied By: spouse Schedule Follow-up Appointment: Yes Clinical Summary of Care: Patient Declined Electronic Signature(s) Signed: 03/15/2020 5:51:45 PM By: [name] RN, BSN Entered By: [name] on 03/15/2020 14:04:57 -------------------------------------------------------------------------------- Lower Extremity Assessment Details Patient Name: Date of Service: UPTO [name] 03/15/2020 1:00 PM Medical Record Number: [id number] Patient Account Number: [id number] Date of Birth/Sex: Treating RN: 1941-01-09 (79 y.o. Helene Shoe, Tammi Klippel Primary Care Grier Vu: Shon Baton Other Clinician: Referring Malyiah Fellows: Treating Shaniya Tashiro/Extender: Lowella Fairy in Treatment: 7 Edema Assessment Assessed: Shirlyn Goltz: Yes] [Right: No] Edema: [Left: Ye] [Right: s] Calf Left: Right: Point of Measurement: 25 cm From Medial Instep 33 cm Ankle Left: Right: Point of Measurement: 7 cm From Medial Instep 23 cm Vascular Assessment Pulses: Dorsalis Pedis Palpable: [Left:Yes] Electronic Signature(s) Signed: 03/15/2020 5:31:28 PM By: Deon Pilling Entered By: Deon Pilling on 03/15/2020 13:21:36 -------------------------------------------------------------------------------- [place] Details Patient Name: Date of Service: UPTO [name] 03/15/2020 1:00 PM Medical Record Number: [id number] Patient Account Number: [id number] Date of Birth/Sex: Treating RN: [birth year] (79 y.o. [name] Primary Care Alphons Burgert: Shon Baton Other Clinician: Referring Alanii Ramer: Treating Henchy Mccauley/Extender: Lowella Fairy in Treatment: 7 Active Inactive Electronic Signature(s) Signed: 03/15/2020 5:51:45 PM By: [name] RN, BSN Entered By: [name] on 03/15/2020 13:59:57 -------------------------------------------------------------------------------- Pain Assessment Details Patient Name: Date of Service: UPTO [name] 03/15/2020 1:00 PM Medical Record Number: [id number] Patient Account Number: [id number] Date of Birth/Sex: Treating RN: 28-Apr-1941 (79 y.o. Debby Bud Primary Care Mazie Fencl: Shon Baton Other Clinician: Referring Megan Presti: Treating Aidden Markovic/Extender: Lowella Fairy in Treatment: 7 Active Problems Location of Pain Severity and  Description of Pain Patient Has Paino No Site Locations Rate the pain. Current Pain Level: 0 Pain Management and Medication Current Pain Management: Medication: No Cold Application: No Rest: No Massage: No Activity: No T.E.N.S.: No Heat Application: No Leg drop or elevation: No Is the Current Pain Management Adequate: Adequate How does your wound impact your activities of daily livingo Sleep: No Bathing: No Appetite: No Relationship With Others: No Bladder Continence: No Emotions: No Bowel Continence: No Work: No Toileting: No Drive: No Dressing: No Hobbies: No Electronic Signature(s) Signed: 03/15/2020 5:31:28 PM By: Deon Pilling Entered By: Deon Pilling on 03/15/2020 13:21:22 -------------------------------------------------------------------------------- Patient/Caregiver Education Details Patient Name: Date of Service: UPTO [name] 11/3/2021andnbsp1:00 PM Medical Record Number: [id number] Patient Account Number: [id number] Date of Birth/Gender: Treating RN: [birth year] (79 y.o. [name] Primary Care Physician: Shon Baton Other Clinician: Referring Physician: Treating Physician/Extender: Lowella Fairy in Treatment: 7 Education Assessment Education Provided To: Patient Education Topics Provided Wound/Skin Impairment: Methods: Explain/Verbal Responses:  Reinforcements needed, State content correctly Electronic Signature(s) Signed: 03/15/2020 5:51:45 PM By: [name] RN, BSN Entered By: [name] on 03/15/2020 14:00:16 -------------------------------------------------------------------------------- Wound Assessment Details Patient Name: Date of Service: UPTO [name] 03/15/2020 1:00 PM Medical Record Number: [id number] Patient Account Number: [id number] Date of Birth/Sex: Treating RN: 1941-02-13 (79 y.o. [name], [name] Primary Care Sherrelle Prochazka: Shon Baton Other Clinician: Referring Lunden Stieber: Treating Tyjah Hai/Extender: Lowella Fairy in Treatment: 7 Wound Status Wound Number: 1 Primary Venous Leg Ulcer Etiology: Etiology: Wound Location: Left, Anterior Lower Leg Wound Healed - Epithelialized Wounding Event: Trauma Status: Date Acquired: 10/12/2019 Comorbid Anemia, Deep Vein Thrombosis, Hypertension, Received Weeks Of Treatment: 7 History: Chemotherapy, Received Radiation Clustered Wound: No Wound Measurements Length: (cm) Width: (cm) Depth: (cm) Area: (cm) Volume: (cm) 0 % Reduction in Area: 100% 0 % Reduction in Volume: 100% 0 Epithelialization: Large (67-100%) 0 Tunneling: No 0 Undermining: No Wound Description Classification: Full Thickness Without Exposed Support Structures Wound Margin: Epibole Exudate Amount: Small Exudate Type: Serosanguineous Exudate Color: red, brown Foul Odor After Cleansing: No Slough/Fibrino Yes Wound Bed Granulation Amount: None Present (0%) Exposed Structure Necrotic Amount: None Present (0%) Fascia Exposed: No Fat Layer (Subcutaneous Tissue) Exposed: Yes Tendon Exposed: No Muscle Exposed: No Joint Exposed: No Bone Exposed: No Assessment Notes fibrin noted over wound bed. Electronic Signature(s) Signed: 03/15/2020 5:51:45 PM By: [name] RN, BSN Entered By: [name] on 03/15/2020  14:02:04 -------------------------------------------------------------------------------- [place] Details Patient Name: Date of Service: UPTO [name] 03/15/2020 1:00 PM Medical Record Number: [id number] Patient Account Number: [id number] Date of Birth/Sex: Treating RN: [birth year] (79 y.o. Helene Shoe, Tammi Klippel Primary Care Daran Favaro: Shon Baton Other Clinician: Referring Kannon Baum: Treating Karoline Fleer/Extender: Lowella Fairy in Treatment: 7 Vital Signs Time Taken: 13:21 Temperature (F): 97.2 Height (in): 59 Pulse (bpm): 67 Weight (lbs): 108 Respiratory Rate (breaths/min): 20 Body Mass Index (BMI): 21.8 Blood Pressure (mmHg): 144/70 Reference Range: 80 - 120 mg / dl Electronic Signature(s) Signed: 03/15/2020 5:31:28 PM By: Deon Pilling Entered By: Deon Pilling on 03/15/2020 13:21:11

## 2020-03-15 NOTE — Assessment & Plan Note (Signed)
PET CT scan showed no evidence of recurrence of cancer Observe closely with serial imaging study for now

## 2020-03-15 NOTE — Assessment & Plan Note (Signed)
Since discontinuation of treatment, there is no definitive signs of residual disease seen on PET CT scan She tolerated chemotherapy very poorly We will take a treatment break and I will continue to monitor her closely I will see her again in 3 months for further follow-up We will get her port flush next month before her trip

## 2020-03-15 NOTE — Assessment & Plan Note (Signed)
She has poor wound healing  According to the patient and her husband, her leg ulcer has improved in appearance I did not remove the bandage today and will defer to her primary care doctor and wound care for further management

## 2020-03-15 NOTE — Progress Notes (Signed)
[place] OFFICE PROGRESS NOTE  Patient Care Team: Shon Baton, MD as PCP - General (Internal Medicine) Heath Lark, MD as Consulting Physician (Hematology and Oncology)  ASSESSMENT & PLAN:  Endometrial cancer [facility]) Since discontinuation of treatment, there is no definitive signs of residual disease seen on PET CT scan She tolerated chemotherapy very poorly We will take a treatment break and I will continue to monitor her closely I will see her again in 3 months for further follow-up We will get her port flush next month before her trip  Primary lung cancer, right [facility]) PET CT scan showed no evidence of recurrence of cancer Observe closely with serial imaging study for now  Anemia, chronic disease After recent blood transfusion, her hemoglobin has improved Observe for now  Bilateral edema of lower extremity She has persistent bilateral chronic lower extremity edema Continue close observation I would not recommend aggressive diuretic therapy due to her history of poor oral intake and prone to get dehydrated  Leg ulcer ([place]) She has poor wound healing  According to the patient and her husband, her leg ulcer has improved in appearance I did not remove the bandage today and will defer to her primary care doctor and wound care for further management   No orders of the defined types were placed in this encounter.   All questions were answered. The patient knows to call the clinic with any problems, questions or concerns. The total time spent in the appointment was 30 minutes encounter with patients including review of chart and various tests results, discussions about plan of care and coordination of care plan   Heath Lark, MD 03/15/2020 8:29 AM  INTERVAL HISTORY: Please see below for problem oriented charting. She returns with her husband for further follow-up and review test results This patient have complicated history of uterine cancer, lung cancer and  breast cancer Her treatment was interrupted due to nonhealing leg ulcer for the past 3 months According to her, it is getting better No recent discharge from the wound No recent infection, fever or chills She is complaining of bilateral lower extremity edema She is eating well and gaining weight  SUMMARY OF ONCOLOGIC HISTORY: Oncology History Overview Note  Biopsy of recurrence 06-2014 ER negative. PDL1 testing low at 2% on the uterine cancer She progressed on carboplatin and Paclitaxel for uterine cancer HER 2 negative on pathology [id number] Negative genetic testing  On her lung cancer sample, [employer] testing revealed 10% PD-1 positive EGFR mutation was detected, exon 19 deletion   Endometrial cancer ([place])  12/01/2007 Initial Diagnosis   She has recurrent papillary serous endometrial carcinoma. Briefly she was diagnosed in 2009 with a FIGO IB pappilary serous carcinoma treated with hysterectomy followed by adjuvant chemo and vaginal brachytherapy. She then had a recurrence diagnosed in late 2012 that was deemed not to be surgically resectable, and was treated with 6 cycles of carbo/taxol followed by 5040 cGy of EBRT to the pelvic mass. She was then followed with serial imaging and was noted in June of this year to have slight increase in the size of the mass, and again in September there was small enlargement of the mass. She had a re-staging PET scan on 03/10/13 that showed FDG activity in the pelvic mass, with no other areas of disease   12/01/2007 Imaging   CT scan of chest, abdomen and pelvis: 1.  Mass like area of decreased attenuation in the central uterus, consistent with the known endometrial carcinoma.  Deep myometrial invasion is suspected.  Pelvic MRI without and with contrast could be performed for further staging workup if clinically warranted. 2.  No evidence of extrauterine extension or lymphadenopathy. 3.  Sigmoid diverticulosis incidentally noted.   12/15/2007 Surgery    --12/2007 laparoscopic hysterectomy and PLND --05/2008 complted 6 cycles adjuvant carbo/taxol followed by vaginal cuff brachy --03/2011 exploratory lararoscopy, ureterolysis --4/13 completed 6 cycels carbotaxol --8/13 completed EBRT to the left pelvic sidewall    06/10/2008 Imaging   CT scan abdomen and pelvis 1.  Nonspecific mildly prominent inguinal lymph nodes, right greater than left. 2.  Interval hysterectomy without evidence of recurrent pelvic mass or fluid collection.   03/22/2011 Imaging   Ct scan abdomen and pelvis 1.  Local uterine cancer recurrence with two large nodes along the left pelvic sidewall. 2.  No evidence of bowel obstruction, urinary obstruction, or more distant metastasis.   03/31/2011 Relapse/Recurrence   chemotherapy and external beam radiation   05/16/2011 - 09/17/2011 Chemotherapy   She had 6 cycles of carboplatin and Taxol    07/17/2011 Imaging   Ct scan abdomen and pelvis 1.  Interval improvement in the previously demonstrated left pelvic local recurrence of tumor. 2.  No disease progression or complication identified. 3.  Mild bladder wall thickening on the right, nonspecific and possibly related to incomplete distension and/or radiation therapy. 4.  Cholelithiasis   10/11/2011 Imaging   CT scan abdomen and pelvis 1.  Interval enlargement of the left pelvic sidewall masses. 2.  No new nodular disease in the pelvis or lymphadenopathy. 3.  No evidence  of distant metastasis. 4.  Mild hydronephrosis on the right is similar to prior. 5.  Focal thickening of the right aspect the bladder is stable compared to prior.    10/28/2011 - 12/05/2011 Radiation Therapy   radiation for pelvic sidewall recurrence   10/28/2011 - 12/05/2011 Radiation Therapy   10/28/11-12/05/11: Radiotherapy to the left pelvic sidewall   03/18/2012 Imaging   CT abdomen 1.  Today's study demonstrates a positive response to therapy with decreased size of left pelvic sidewall nodal masses,  as detailed above.  No new soft tissue masses or new lymphadenopathy is identified within the abdomen or pelvis. Continue attention on follow-up studies is recommended. 2.  Cholelithiasis without findings to suggest acute cholecystitis. 3.  Colonic diverticulosis without findings to suggest acute diverticulitis at this time. 4.  Mild asymmetric urinary bladder wall thickening is unchanged compared to prior examinations and is nonspecific.  No definite bladder wall mass is identified at this time. 5.  Additional findings, similar to prior examinations, as above   06/22/2012 Imaging   CT abdomen 1.  Further decreased size of the left pelvic peripherally enhancing lesion. 2.  No new sites of new or progressive disease. 3. Esophageal air fluid level suggests dysmotility or gastroesophageal reflux. 4.  Cholelithiasis   11/02/2012 Imaging   CT abdomen 1.  The left pelvic side wall lesion demonstrates mild increase in size from previous exam. 2.  No new sites of new or progressive disease. 3.  Cholelithiasis.    01/28/2013 Imaging   CT abdomen 1. Interval small increase in volume of enhancing mass adjacent to the left pelvic sidewall. 2. No evidence of abdominal or pelvic lymphadenopathy   04/02/2013 Surgery   pelvic mass resection with IORT   04/02/2013 - 04/02/2013 Radiation Therapy   04/02/13: Intraoperative radiotherapy to the left pelvis   05/10/2013 PET scan   1. Hypermetabolic mass in the deep left  pelvis consistent with metastasis. 2. No additional evidence of local metastasis. No evidence of distant metastasis. 3. Small right lower lobe pulmonary nodule is not hypermetabolic.   07/27/2013 Imaging   No evidence of recurrent or metastatic disease. Apparent prior resection of the deep left pelvic metastasis.   02/08/2014 Imaging   No CT findings for recurrent or metastatic disease involving the abdomen/ pelvis   06/13/2014 Relapse/Recurrence   + recurrence paraspinous muscle    06/21/2014 Procedure   There is a soft tissue lesion along the left side of L3-L4. This lesion roughly measures up to 2.2 cm. Needle was positioned along the posterior aspect of the lesion. Small amount of air in the paraspinal tissues following needle removal   06/21/2014 Pathology Results   Bone, biopsy, left lumbar paraspinal - METASTATIC POORLY DIFFERENTIATED CARCINOMA, CONSISTENT WITH HIGH GRADE SEROUS CARCINOMA SEE COMMENT. Microscopic Comment The carcinoma demonstrates the following immunophenotype: Cytokeratin 7 - patchy moderate to strong expression Estrogen receptor - negative expression P53 - strong diffuse expression TTF-1 - negative expression WT-1 - negative expression CD56 - focal moderate strong expression Synaptophysin - negative expression GCDFP - negative expression The history of primary endometrial papillary serous carcinoma and primary mammary carcinoma is noted. In the current case, the overall morphologic and immunophenotype are that of poorly differentiated carcinoma, consistent with high grade serous carcinoma.    07/11/2014 - 08/12/2014 Radiation Therapy   07/11/14-08/12/14: 55 Gy in 25 fractions to the Lumbar spine   08/15/2014 Imaging   CT scan of chest, abdomen and pelvis 1. Since the biopsy study of 06/21/2014, similar to slight decrease in size of a left paraspinous lesion at L3-4. 2. No new sites of metastatic disease identified. 3. 8 mm ground-glass nodule in the right lower lobe is grossly similar to 03/10/2013, suggesting a benign etiology. 4. Cholelithiasis. 5. Apparent sigmoid colonic wall thickening which could be due to underdistention. Colitis felt less likely. 6.  Atherosclerosis, including within the coronary arteries. 7. Pelvic floor laxity.   10/07/2014 Imaging   CT scan of chest, abdomen and pelvis: Stable to slight interval decrease in size of left paraspinous lesion at L3-4. No new sites of metastatic disease. Unchanged ground-glass nodule within  the right lower lobe, potentially benign in etiology. Recommend attention on followup. Cholelithiasis. Sigmoid colonic diverticulosis. No CT evidence to suggest acute diverticulitis.   01/19/2015 Imaging   Ct scan of abdomen and pelvis 1. Decrease in size of left paraspinous metastasis. 2. No new sites of disease. 3. Stable ground-glass attenuating nodule within the superior segment of right lower lobe. 4. Gallstones   05/02/2015 Imaging   Ct abdomen and pelvis: Slight interval increase in size of left lumbar paraspinal soft tissue metastasis. No new sites of metastatic disease identified within the abdomen or pelvis.    05/11/2015 PET scan   1. The small soft tissue mass just lateral to the left L3 for neural foramen is hypermetabolic, favoring malignancy. 2. There is also a small focus of hypermetabolic activity between the left L1 and L2 transverse processes, but without a CT correlate. 3. The 13 mm sub solid nodule in the superior segment right lower lobe is stable from recent exams but has slowly increased in size over the last 7 years. Although not hypermetabolic, the appearance is concerning for the possibility of a low grade adenocarcinoma. 4. Other imaging findings of potential clinical significance: Chronic bilateral maxillary sinusitis. Coronary, aortic arch, and branch vessel atherosclerotic vascular disease. Aortoiliac atherosclerotic vascular disease. Cholelithiasis. Sigmoid colon  diverticulosis. Lumbar scoliosis.   07/11/2015 Imaging   Ct scan of chest, abdomen and pelvis: 1. 13 mm mixed solid and sub solid nodule in the posterior right lower lobe was present in 2009 and has clearly progressed in the interval since that study. Imaging features remain highly concerning for low-grade or well differentiated adenocarcinoma. 2. Slight increase size of in the hypermetabolic, rim enhancing nodule identified adjacent to the left L3-4 neural foramen. Metastatic disease remains a  concern. 3. No evidence for discrete soft tissue lesion between the left L1 and 2 transverse processes, the site of focal FDG uptake on the recent PET-CT. 4. Cholelithiasis. 5. Abdominal aortic atherosclerosis   08/03/2015 - 01/16/2016 Chemotherapy   She received carboplatin and Taxol   11/06/2015 PET scan   1. Hypermetabolic left I7-7 paraspinous metastasis (biopsy-proven) is stable in size and slightly decreased in metabolism. 2. Previously described small focus of hypermetabolism between the left L2 and L3 spinous processes has resolved. 3. New mild linear hypermetabolism to the left of the T11-12 spinous processes without discrete mass on the CT images, favor benign activity related activity, recommend attention on follow-up PET-CT.  4. No definite new sites of hypermetabolic metastatic disease. No recurrent hypermetabolic metastatic disease in the pelvis. 5. Interval stability of subsolid 1.3 cm superior segment right lower lobe pulmonary nodule without associated significant metabolism, which has grown compared to the 2009 chest CT study, and remain suspicious for low grade adenocarcinoma. 6. Additional findings include aortic atherosclerosis, coronary atherosclerosis, mild multinodular goiter with no hypermetabolic thyroid nodules, cholelithiasis and moderate sigmoid diverticulosis.   02/12/2016 PET scan   1. Interval increase in size and metabolic activity of the LEFT paraspinal soft tissue metastasis. 2. New activity within the musculature of the LEFT chest wall. Given the unusual location of the paraspinal metastasis cannot exclude a second soft tissue metastasis to the musculature however favor benign physiologic activity. 3. Stable RIGHT lower lobe pulmonary nodule. 4. No evidence of local recurrence.     04/08/2016 Imaging   Ct scan of chest, abdomen and pelvis: 1. Similar size of a  left paravertebral abdominal lesion. 2. No new or progressive metastatic disease. 3. No  correlate for the muscular activity about the lateral left chest wall. 4.  Coronary artery atherosclerosis. Aortic atherosclerosis. 5. Cholelithiasis. 6. Similar right lower lobe pulmonary nodule.   06/03/2016 Imaging   CT abdomen and pelvis 1. Similar to mild enlargement of a paravertebral soft tissue lesion at the L3-4 level. 2. No new sites of disease identified. 3. Cholelithiasis. 4. Hysterectomy. 5.  Aortic atherosclerosis.    09/02/2016 Imaging   CT abdomen and pelvis 1. Continue mild interval increase in size of LEFT paraspinal mass (1-2 mm). 2. No evidence of new metastatic disease in the abdomen pelvis. 3. Post hysterectomy anatomy   09/26/2016 Imaging   MR lumbar spine 1. Left paraspinal metastasis with epicenter adjacent to the left L3 neural foramen does extend into the foramen to the level of the left lateral epidural space (series 9, image 31), but also tracks cephalad and caudal along the L2-L4 lumbar plexus (series 10, image 15). No extension into the left L2 or L4 neural foramina. And no more distal extension of tumor. 2. Abnormal signal in the medial left psoas and ventral left erector spinae muscles at L2 and L3 is probably denervation related. 3. No bone invasion or osseous metastatic disease. Suspect previous radiation of the L2 through L5 spinal levels. 4. No dural or intradural metastatic disease.  10/22/2016 Procedure   She underwent stereotactic Radiosurgery   10/30/2016 Genetic Testing   Patient has genetic testing done for Inheritable genetic mutation panel. Results revealed patient has no actionable mutation.   01/21/2017 Imaging   1. Reduced size and conspicuity of the left paraspinal mass at the L3-4 level. The mass still present but has enhancement similar to that of adjacent psoas musculature. 2. No new metastatic disease is identified. 3. Other imaging findings of potential clinical significance: Cholelithiasis. Aortic Atherosclerosis (ICD10-I70.0).  Sigmoid diverticulosis. Lumbar scoliosis.   05/09/2017 Imaging   Ct scan of abdomen and pelvis 1. Paraspinal mass adjacent to the left side of L3-L4 is less distinct than prior examinations, and is therefore difficult to discretely measure, however, the overall appearance suggests continued positive response to therapy. 2. No new signs of metastatic disease elsewhere in the abdomen or pelvis. 3. Aortic atherosclerosis, in addition to at least right coronary artery disease. Assessment for potential risk factor modification, dietary therapy or pharmacologic therapy may be warranted, if clinically indicated. 4. Colonic diverticulosis without evidence of acute diverticulitis at this time. 5. Additional incidental findings, as above. Aortic Atherosclerosis (ICD10-I70.0).   08/14/2017 Imaging   1. Treated left paraspinal mass centered at L3-4. Mass size is stable but there has been some evolution of tumor characteristics with less central fluid seen today. Recommend continued surveillance. 2. No evidence of untreated metastasis. 3. Degenerative changes and related impingement are described above.   10/28/2017 Imaging   Stable small left paraspinal soft tissue mass. No new or progressive disease within the abdomen or pelvis.  Cholelithiasis.  No radiographic evidence of cholecystitis.  Colonic diverticulosis, without radiographic evidence of diverticulitis.   04/30/2018 Imaging   Stable post treatment change of the partially necrotic LEFT paravertebral mass at L3-L4. 18 x 21 mm cross-section. Mass effect on the LEFT L3 nerve root redemonstrated. Enhancing and atrophic LEFT psoas is inseparable.   10/15/2018 Imaging   1. Stable post treatment size and appearance of partially necrotic left paravertebral mass at L3-4, measuring 19 x 25 mm on current exam (unchanged when measured at similar level on previous study). Mass effect on the adjacent left L3 nerve root is unchanged. Adjacent enhancing and  atrophic left psoas muscle. 2. Left convex scoliosis with associated multilevel degenerative spondylolysis and facet arthrosis, unchanged   10/29/2018 Imaging   Stable small left L3 paraspinal soft tissue mass. No evidence of new or progressive metastatic disease, or other acute findings.   Cholelithiasis.  No radiographic evidence of cholecystitis.   Colonic diverticulosis, without radiographic evidence of diverticulitis.   11/11/2018 PET scan   1. Low level metabolism (max SUV 2.0) associated with the irregular subsolid superior segment right lower lobe 2.1 cm pulmonary nodule. As better depicted on the recent diagnostic chest CT study, this nodule crosses the major fissure into the right upper lobe and has increased in size and density on multiple imaging studies back to 2009. Findings are most suggestive of primary bronchogenic adenocarcinoma. 2. No hypermetabolic thoracic adenopathy. 3. Persistent hypermetabolism (max SUV 8.0) associated with the known left L3-4 paraspinous metastasis, mildly decreased in metabolism since 02/12/2016 PET-CT. 4. No additional sites of hypermetabolic metastatic disease in the abdomen or pelvis. No metabolic evidence of peritoneal recurrence. No ascites. 5. Chronic findings include: Aortic Atherosclerosis (ICD10-I70.0). Coronary atherosclerosis. Chronic bilateral maxillary sinusitis. Cholelithiasis. Moderate sigmoid diverticulosis.   01/07/2019 - 04/04/2019 Chemotherapy   The patient had carboplatin and Alimta for chemotherapy treatment.     05/03/2019 -  Chemotherapy   The patient had Pembrolizumab and Lenvima for chemotherapy treatment.     07/23/2019 PET scan   1. 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 No new sites of hypermetabolic metastatic disease on today's study. 2. Stable appearance of surgical changes in the right lung without features to suggest local recurrence or metastatic lung  cancer. 3. Cholelithiasis. 4.  Aortic Atherosclerois (ICD10-170.0)   10/20/2019 Imaging   Bilateral venous Doppler US RIGHT:  - Findings consistent with acute and occlusive deep vein thrombosis involving the right common femoral vein, right external iliac vein, right femoral vein, right popliteal vein, right posterior tibial veins, and right peroneal veins.   Unable to adequately visualize the common iliac veins due to bowel gas. The imaged portions of the IVC were patent.     LEFT:  - No evidence of common femoral vein obstruction.    11/08/2019 PET scan   1. Further reduction in activity at the left L3 level and left paraspinal tissues at L3. Maximum vertebral SUV currently 3.9, previously 4.8 on 07/23/2019 and previously 10.0 on 04/14/2019. 2. Late phase healing of previous left transverse process fractures at L3 and L4. Suspected healing right sixth rib fracture anteriorly. 3. There is new focal activity in the vicinity of the right internal jugular vein in the lower neck. Maximum SUV in this vicinity is 5.1. I not see a well-defined lymph node are thyroid lesion to corroborate with the focus, but surveillance in this region is suggested. 4. Other imaging findings of potential clinical significance: Aortic Atherosclerosis (ICD10-I70.0). Coronary atherosclerosis. Cholelithiasis. Sigmoid colon diverticulosis.   Metastatic cancer to bone ([place])  06/27/2014 Initial Diagnosis   Metastatic cancer to bone   Primary lung cancer, right ([place])  11/04/2018 Initial Diagnosis   Primary lung cancer, right ([place])   11/11/2018 PET scan   1. Low level metabolism (max SUV 2.0) associated with the irregular subsolid superior segment right lower lobe 2.1 cm pulmonary nodule. As better depicted on the recent diagnostic chest CT study, this nodule crosses the major fissure into the right upper lobe and has increased in size and density on multiple imaging studies back to 2009. Findings are most suggestive of  primary bronchogenic adenocarcinoma. 2. No hypermetabolic thoracic adenopathy. 3. Persistent hypermetabolism (max SUV 8.0) associated with the known left L3-4 paraspinous metastasis, mildly decreased in metabolism since 02/12/2016 PET-CT. 4. No additional sites of hypermetabolic metastatic disease in the abdomen or pelvis. No metabolic evidence of peritoneal recurrence. No ascites. 5. Chronic findings include: Aortic Atherosclerosis (ICD10-I70.0). Coronary atherosclerosis. Chronic bilateral maxillary sinusitis. Cholelithiasis. Moderate sigmoid diverticulosis.   12/07/2018 Pathology Results   1. Lung, resection (segmental or lobe), Right Lobe Superior with endblock wedge of right upper lobe - INVASIVE ADENOCARCINOMA, MODERATELY DIFFERENTIATED, SPANNING 1.6 CM. - THE SURGICAL RESECTION MARGINS ARE NEGATIVE FOR CARCINOMA. - SEE ONCOLOGY TABLE BELOW. 2. Lymph node, biopsy, Level 9 #1 - THERE IS NO EVIDENCE OF CARCINOMA IN 1 OF 1 LYMPH NODE (0/1). 3. Lymph node, biopsy, Level 9 #2 - THERE IS NO EVIDENCE OF CARCINOMA IN 1 OF 1 LYMPH NODE (0/1). 4. Lymph node, biopsy, Level 7 #1 - METASTATIC CARCINOMA IN 1 OF 1 LYMPH NODE (1/1). 5. Lymph node, biopsy, Level 7 #2 - THERE IS NO EVIDENCE OF CARCINOMA IN 1 OF 1 LYMPH NODE (0/1). 6. Lymph node, biopsy, Level 12 #1 - THERE IS NO EVIDENCE OF CARCINOMA IN 1 OF 1 LYMPH NODE (0/1). 7. Lymph node,  biopsy, Level 12 #2 - THERE IS NO EVIDENCE OF CARCINOMA IN 1 OF 1 LYMPH NODE (0/1). 8. Lymph node, biopsy, Level 10 #1 - THERE IS NO EVIDENCE OF CARCINOMA IN 1 OF 1 LYMPH NODE (0/1). 9. Lymph node, biopsy, Level 4R #1 - THERE IS NO EVIDENCE OF CARCINOMA IN 1 OF 1 LYMPH NODE (0/1). 10. Lymph node, biopsy, Level 4R #2 - THERE IS NO EVIDENCE OF CARCINOMA IN 1 OF 1 LYMPH NODE (0/1). 11. Lymph node, biopsy, Level 2R #1 - THERE IS NO EVIDENCE OF CARCINOMA IN 1 OF 1 LYMPH NODE (0/1). 12. Lung, resection (segmental or lobe), Right Lobe Superior - BENIGN LUNG  PARENCHYMA. - THERE IS NO EVIDENCE OF MALIGNANCY. Procedure: Segmentectomy. Specimen Laterality: Right. Tumor Site: Right superior lobe. Tumor Size: 1.6 cm (gross measurement). Tumor Focality: Unifocal. Histologic Type: Adenocarcinoma, moderately differentiated. Visceral Pleura Invasion: Not identified. Lymphovascular Invasion: Not identified. Direct Invasion of Adjacent Structures: Not identified. Margins: Negative for carcinoma. Treatment Effect: N/A Regional Lymph Nodes: Number of Lymph Nodes Involved: 1 (level 7) Number of Lymph Nodes Examined: 10 Pathologic Stage Classification (pTNM, AJCC 8th Edition): pT1b, pN2 Ancillary Studies: The tumor cells are positive for Napsin-A, TTF-1, and p53. They are essentially negative for estrogen receptor, PAX-8 and progesterone receptor. Additional studies can be performed upon clinician request. Representative Tumor Block: 1D-1E. (JBK:gt, 12/09/18)   12/07/2018 Surgery   PREOPERATIVE DIAGNOSIS:  Right lung nodule.   POSTOPERATIVE DIAGNOSIS:  Non-small cell carcinoma- suspected primary lung carcinoma, clinical stage T2N01b.   PROCEDURE:   Right video-assisted thoracoscopy, Right lower lobe superior segmentectomy with en bloc wedge resection of right upper lobe, Lymph node dissection, Intercostal nerve block levels 3-9.   SURGEON:  Modesto Charon, MD   FINDINGS:  Mass palpable in posterior aspect of superior segment of right lower lobe, likely involvement across fissure into the upper lobe.  Frozen section revealed non-small cell carcinoma. upper and lower lobe margins clear.   12/23/2018 Cancer Staging   Staging form: Lung, AJCC 8th Edition - Pathologic: Stage IIIA (pT1b, pN2, cM0) - Signed by Heath Lark, MD on 12/23/2018   01/05/2019 Imaging   MRI brain 1. No metastatic disease or acute intracranial abnormality identified. 2. Small chronic parafalcine meningioma size is stable since that described in 2006 (12 x 15 mm). 3. Advanced  signal changes in the cerebral white matter and to a lesser extent pons, nonspecific but most commonly due to chronic small vessel disease. 4. Partially visible degenerative cervical spinal stenosis.     01/07/2019 -  Chemotherapy   The patient had carboplatin and Alimta for chemotherapy treatment.     04/14/2019 PET scan   1. 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 As shown on recent MRI of 04/01/2019, there is a new component of invasion into the left lower L3 vertebral body. This new component has a maximum SUV of 10.0, compatible with active malignancy. 2. Interval wedge resection of portions of the right upper lobe and right lower lobe at the site of the prior nodule. There is only minimal low-grade activity along the wedge resection clips, maximum SUV of 1.5. Surveillance is likely warranted. No new nodule identified. 3. Other imaging findings of potential clinical significance: Aortic Atherosclerosis (ICD10-I70.0). Coronary atherosclerosis. Thoracolumbar scoliosis. Cholelithiasis. Sigmoid colon diverticulosis.     05/03/2019 -  Chemotherapy   The patient had Pembrolizumab and Lenvima for chemotherapy treatment.     05/22/2019 Imaging   Ct head Atrophy, chronic microvascular disease.  No acute intracranial abnormality.     03/09/2020 PET scan   1. No evidence of malignant disease progression. 2. Similar mild metabolic activity within at the sclerotic L3 vertebral body lesion. Reduction in paraspinal muscular metabolic activity at L3 vertebral body level.     REVIEW OF SYSTEMS:   Constitutional: Denies fevers, chills or abnormal weight loss Eyes: Denies blurriness of vision Ears, nose, mouth, throat, and face: Denies mucositis or sore throat Respiratory: Denies cough, dyspnea or wheezes Gastrointestinal:  Denies nausea, heartburn or change in bowel habits SLymphatics: Denies new lymphadenopathy or easy bruising Neurological:Denies numbness,  tingling or new weaknesses Behavioral/Psych: Mood is stable, no new changes  All other systems were reviewed with the patient and are negative.  I have reviewed the past medical history, past surgical history, social history and family history with the patient and they are unchanged from previous note.  ALLERGIES:  is allergic to ace inhibitors, dilaudid [hydromorphone hcl], and codeine.  MEDICATIONS:  Current Outpatient Medications  Medication Sig Dispense Refill  . atenolol (TENORMIN) 25 MG tablet Take 25 mg by mouth 2 (two) times daily.     . furosemide (LASIX) 20 MG tablet Take 1 tablet (20 mg total) by mouth daily. 10 tablet 0  . HYDROcodone-acetaminophen (NORCO/VICODIN) 5-325 MG tablet Take 1 tablet by mouth every 4 (four) hours as needed. 12 tablet 0  . lidocaine-prilocaine (EMLA) cream Apply to [facility] cath 1-2 hours prior to access as directed 30 g 1  . LORazepam (ATIVAN) 0.5 MG tablet 1 tablet po 30 minutes prior to radiation or MRI 30 tablet 0  . memantine (NAMENDA) 5 MG tablet TAKE 1 TABLET DAILY FOR ONE WEEK, THEN TAKE 1 TABLET TWICE DAILY FOR ONE WEEK, THEN TAKE 1 TABLET IN THE MORNING AND 2 IN THE EVENING FOR ONE WEEK, THEN TAKE 2 TABLETS TWICE DAILY 210 tablet 1  . mirtazapine (REMERON) 7.5 MG tablet TAKE 1 TABLET (7.5 MG TOTAL) BY MOUTH AT BEDTIME. 90 tablet 2  . Multiple Vitamin (MULTIVITAMIN WITH MINERALS) TABS tablet Take 1 tablet by mouth daily.    . potassium chloride (KLOR-CON) 10 MEQ tablet Take 1 tablet (10 mEq total) by mouth daily. 10 tablet 0  . rivaroxaban (XARELTO) 10 MG TABS tablet Take 1 tablet (10 mg total) by mouth daily. 30 tablet 11  . simvastatin (ZOCOR) 20 MG tablet Take 20 mg by mouth every evening.      No current facility-administered medications for this visit.   Facility-Administered Medications Ordered in Other Visits  Medication Dose Route Frequency Provider Last Rate Last Admin  . heparin lock flush 100 unit/mL  500 Units Intracatheter Once  Alvy Bimler, Azarias Chiou, MD      . sodium chloride flush (NS) 0.9 % injection 10 mL  10 mL Intracatheter Once Alvy Bimler, Gearline Spilman, MD        PHYSICAL EXAMINATION: ECOG PERFORMANCE STATUS: 2 - Symptomatic, <50% confined to bed  Vitals:   03/14/20 1417  BP: 140/67  Pulse: 80  Resp: 16  Temp: (!) 97 F (36.1 C)  SpO2: 100%   Filed Weights   03/14/20 1417  Weight: 112 lb 6.4 oz (51 kg)    GENERAL:alert, no distress and comfortable NEURO: alert & oriented x 3 with fluent speech, no focal motor/sensory deficits  LABORATORY DATA:  I have reviewed the data as listed    Component Value Date/Time   NA 142 03/09/2020 1057   NA 140 05/09/2017 0844   K 3.8 03/09/2020 1057   K  4.1 05/09/2017 0844   CL 107 03/09/2020 1057   CO2 26 03/09/2020 1057   CO2 26 05/09/2017 0844   GLUCOSE 79 03/09/2020 1057   GLUCOSE 82 05/09/2017 0844   BUN 22 03/09/2020 1057   BUN 20.7 05/09/2017 0844   CREATININE 0.86 03/09/2020 1057   CREATININE 0.7 05/09/2017 0844   CALCIUM 9.2 03/09/2020 1057   CALCIUM 9.3 05/09/2017 0844   PROT 6.3 (L) 03/09/2020 1057   PROT 6.8 05/09/2017 0844   ALBUMIN 3.2 (L) 03/09/2020 1057   ALBUMIN 3.6 05/09/2017 0844   AST 40 03/09/2020 1057   AST 27 05/09/2017 0844   ALT 34 03/09/2020 1057   ALT 17 05/09/2017 0844   ALKPHOS 52 03/09/2020 1057   ALKPHOS 43 05/09/2017 0844   BILITOT 0.6 03/09/2020 1057   BILITOT 0.81 05/09/2017 0844   GFRNONAA >60 03/09/2020 1057   GFRAA >60 02/07/2020 1109    No results found for: SPEP, UPEP  Lab Results  Component Value Date   WBC 6.5 03/09/2020   NEUTROABS 4.1 03/09/2020   HGB 9.7 (L) 03/09/2020   HCT 31.6 (L) 03/09/2020   MCV 91.6 03/09/2020   PLT 159 03/09/2020      Chemistry      Component Value Date/Time   NA 142 03/09/2020 1057   NA 140 05/09/2017 0844   K 3.8 03/09/2020 1057   K 4.1 05/09/2017 0844   CL 107 03/09/2020 1057   CO2 26 03/09/2020 1057   CO2 26 05/09/2017 0844   BUN 22 03/09/2020 1057   BUN 20.7 05/09/2017 0844    CREATININE 0.86 03/09/2020 1057   CREATININE 0.7 05/09/2017 0844      Component Value Date/Time   CALCIUM 9.2 03/09/2020 1057   CALCIUM 9.3 05/09/2017 0844   ALKPHOS 52 03/09/2020 1057   ALKPHOS 43 05/09/2017 0844   AST 40 03/09/2020 1057   AST 27 05/09/2017 0844   ALT 34 03/09/2020 1057   ALT 17 05/09/2017 0844   BILITOT 0.6 03/09/2020 1057   BILITOT 0.81 05/09/2017 0844       RADIOGRAPHIC STUDIES: I have personally reviewed the radiological images as listed and agreed with the findings in the report. NM PET Image Restage (PS) Skull Base to Thigh  Result Date: 03/09/2020 CLINICAL DATA:  Subsequent treatment strategy for non-small cell lung cancer. Additional history of endometrial carcinoma. EXAM: NUCLEAR MEDICINE PET SKULL BASE TO THIGH TECHNIQUE: 5.5 mCi F-18 FDG was injected intravenously. Full-ring PET imaging was performed from the skull base to thigh after the radiotracer. CT data was obtained and used for attenuation correction and anatomic localization. Third COVID vaccine in the LEFT arm 03/07/2020 Fasting blood glucose: 81 mg/dl COMPARISON:  11/08/2019 FINDINGS: Mediastinal blood pool activity: SUV max 1.8 Liver activity: SUV max NA NECK: No hypermetabolic lymph nodes in the neck. Incidental CT findings: Port in the anterior chest wall with tip in distal SVC. CHEST: Several hypermetabolic LEFT axillary lymph nodes are noted. These are favored relate to recent COVID vaccine injection in the LEFT arm. Paraspinal musculature uptake in the posterior LEFT back is unchanged. No hypermetabolic mediastinal lymph nodes. Incidental CT findings: No suspicious pulmonary nodules. ABDOMEN/PELVIS: No abnormal hypermetabolic activity within the liver, pancreas, adrenal glands, or spleen. No hypermetabolic lymph nodes in the abdomen or pelvis. No abnormal activity in the pelvis Incidental CT findings: none SKELETON: Interval decrease in activity at the L3 vertebral body with SUV max equal 3.2  compared SUV max equal 3.9. There is sclerosis of  the vertebral body at this level. Near complete resolution in the paraspinal muscular uptake adjacent L3 vertebral body. No new skeletal activity is present. Incidental CT findings: none IMPRESSION: 1. No evidence of malignant disease progression. 2. Similar mild metabolic activity within at the sclerotic L3 vertebral body lesion. Reduction in paraspinal muscular metabolic activity at L3 vertebral body level. Electronically Signed   By: Suzy Bouchard M.D.   On: 03/09/2020 15:23

## 2020-03-15 NOTE — Assessment & Plan Note (Signed)
She has persistent bilateral chronic lower extremity edema Continue close observation I would not recommend aggressive diuretic therapy due to her history of poor oral intake and prone to get dehydrated

## 2020-03-15 NOTE — Assessment & Plan Note (Signed)
After recent blood transfusion, her hemoglobin has improved Observe for now

## 2020-03-16 NOTE — Progress Notes (Addendum)
[name], [name] ([id number]) Visit Report for 03/15/2020 Chief Complaint Document Details Patient Name: Date of Service: UPTO [name] 03/15/2020 1:00 PM Medical Record Number: [id number] Patient Account Number: [id number] Date of Birth/Sex: Treating RN: 06-20-1940 (79 y.o. [name] Primary Care Provider: Shon Sandoval Other Clinician: Referring Provider: Treating Provider/Extender: [name] in Treatment: 7 Information Obtained from: Patient Chief Complaint Left LE Ulcer Electronic Signature(s) Signed: 03/15/2020 1:25:31 PM By: [name] PA-C Entered By: [name] on 03/15/2020 13:25:31 -------------------------------------------------------------------------------- HPI Details Patient Name: Date of Service: UPTO Kelly Fila B. 03/15/2020 1:00 PM Medical Record Number: [id number] Patient Account Number: [id number] Date of Birth/Sex: Treating RN: 01-05-1941 (79 y.o. [name] Primary Care Provider: Shon Sandoval Other Clinician: Referring Provider: Treating Provider/Extender: [name] in Treatment: 7 History of Present Illness HPI Description: 01/26/2020 on evaluation today patient presents for initial evaluation here in our clinic concerning issues she has been having with a wound over her shin of the left leg. She tells me that this occurred near the beginning of June 2021 when she scraped her leg on something that she would not disclose. With that being said she tells me that she has been using a DuoDERM dressing over this as recommended unfortunately that really has not made a big improvement. It is possible based on what I am seeing that she likely had a hematoma which led to a deeper wound which is why it really has not healed as much as we would like to have seen up to this point. Fortunately there is no signs of active infection at this point she has completed roughly 2 weeks ago a 2- week prescription  of doxycycline. The patient is currently on Eliquis due to a history of DVT She also has cancer that the chemotherapy is on hold right now I s. do not believe that the chemotherapy was likely providing a huge issue as far as redness from healed lesion I think it is probably more likely that she does has a deeper wound than what is obvious and that needs to be addressed. Patient also has a history of multiple sites of carcinoma including her breast among other things. She also has mild dementia for which she is on medication. 02/09/2020 on evaluation today patient appears to be doing decently well in regard to her leg ulcer. This is looking a little bit better as far as the overall cleanliness of the wound is concerned today. With that being said there does not appear to be any sign of active infection at this time which is great news. I do believe with the alginate they done better than with the DuoDERM previous. 02/16/2020 on evaluation today patient appears to actually be doing okay in regard to her wound. She does have a little bit of a scab over this area which I am afraid is actually collecting some fluid underneath initially her and her husband were wanting to not remove this. With that being said I feel like it is good to be of utmost importance to remove it as I do believe should be trapping fluid underneath. In the end they agreed to do what I felt was best. 03/01/2020 on evaluation today patient actually appears to be doing excellent infection appears to be from intensive purposes healed. I do not see any obvious signs of an open wound at this time though nonetheless I do believe she does likely still have  a small open area remaining at this point. There is no evidence of active infection currently which is great news. No fevers, chills, nausea, vomiting, or diarrhea. 03/15/2020 upon evaluation today patient appears to be doing excellent in regard to her leg ulcer. There does not appear to be  any signs of active infection at this time in fact I really feel like this is healed. There is just a small scab over the area in question but it so tightly adhered the number for a try and remove this would actually cause trauma to any healing at this point. For that reason I suggested that we actually hold off on doing this I think it is likely healed I am to call it so and then I am and have the patient and her husband continue to monitor this at home. If she has any issues then she would obviously contact the office and let me know in that regard. Electronic Signature(s) Signed: 03/15/2020 2:06:37 PM By: [name] PA-C Entered By: [name] on 03/15/2020 14:06:36 -------------------------------------------------------------------------------- Physical Exam Details Patient Name: Date of Service: UPTO Kelly Fila B. 03/15/2020 1:00 PM Medical Record Number: [id number] Patient Account Number: [id number] Date of Birth/Sex: Treating RN: Jun 09, 1940 (79 y.o. [name] Primary Care Provider: Shon Sandoval Other Clinician: Referring Provider: Treating Provider/Extender: [name] in Treatment: 7 Constitutional Well-nourished and well-hydrated in no acute distress. Respiratory normal breathing without difficulty. Psychiatric this patient is able to make decisions and demonstrates good insight into disease process. Alert and Oriented x 3. pleasant and cooperative. Notes Patient's wound bed actually appears to be completely healed based on what I am seeing I do not see any signs of active infection at this time. Electronic Signature(s) Signed: 03/15/2020 2:06:53 PM By: [name] PA-C Entered By: [name] on 03/15/2020 14:06:52 -------------------------------------------------------------------------------- Physician Orders Details Patient Name: Date of Service: UPTO Kelly Fila B. 03/15/2020 1:00 PM Medical Record Number: [id number] Patient  Account Number: [id number] Date of Birth/Sex: Treating RN: [birth year] (79 y.o. [name], [name] Primary Care Provider: Shon Sandoval Other Clinician: Referring Provider: Treating Provider/Extender: [name] in Treatment: 7 Verbal / Phone Orders: No Diagnosis Coding ICD-10 Coding Code Description I87.2 Venous insufficiency (chronic) (peripheral) L97.822 Non-pressure chronic ulcer of other part of left lower leg with fat layer exposed F01.50 Vascular dementia without behavioral disturbance Z85.9 Personal history of malignant neoplasm, unspecified Discharge From [facility] Discharge from [place] Moisturizing lotion - to leg daily Wound Cleansing May shower and wash wound with soap and water. Edema Control Avoid standing for long periods of time Elevate legs to the level of the heart or above for 30 minutes daily and/or when sitting, a frequency of: Exercise regularly Electronic Signature(s) Signed: 03/15/2020 5:20:58 PM By: [name] PA-C Signed: 03/15/2020 5:51:45 PM By: [name] RN, BSN Entered By: [name] on 03/15/2020 14:03:19 -------------------------------------------------------------------------------- Problem List Details Patient Name: Date of Service: UPTO Kelly Fila B. 03/15/2020 1:00 PM Medical Record Number: [id number] Patient Account Number: [id number] Date of Birth/Sex: Treating RN: 08-14-1940 (79 y.o. [name] Primary Care Provider: Shon Sandoval Other Clinician: Referring Provider: Treating Provider/Extender: [name] in Treatment: 7 Active Problems ICD-10 Encounter Code Description Active Date MDM Diagnosis I87.2 Venous insufficiency (chronic) (peripheral) 01/26/2020 No Yes L97.822 Non-pressure chronic ulcer of other part of left lower leg with fat layer exposed9/15/2021 No Yes F01.50  Vascular dementia without behavioral disturbance  01/26/2020 No Yes Z85.9 Personal history of malignant neoplasm, unspecified 01/26/2020 No Yes Inactive Problems Resolved Problems Electronic Signature(s) Signed: 03/15/2020 1:25:09 PM By: [name] PA-C Entered By: [name] on 03/15/2020 13:25:08 -------------------------------------------------------------------------------- Progress Note Details Patient Name: Date of Service: UPTO Kelly Fila B. 03/15/2020 1:00 PM Medical Record Number: [id number] Patient Account Number: [id number] Date of Birth/Sex: Treating RN: [birth year] (79 y.o. [name] Primary Care Provider: Shon Sandoval Other Clinician: Referring Provider: Treating Provider/Extender: [name] in Treatment: 7 Subjective Chief Complaint Information obtained from Patient Left LE Ulcer History of Present Illness (HPI) 01/26/2020 on evaluation today patient presents for initial evaluation here in our clinic concerning issues she has been having with a wound over her shin of the left leg. She tells me that this occurred near the beginning of June 2021 when she scraped her leg on something that she would not disclose. With that being said she tells me that she has been using a DuoDERM dressing over this as recommended unfortunately that really has not made a big improvement. It is possible based on what I am seeing that she likely had a hematoma which led to a deeper wound which is why it really has not healed as much as we would like to have seen up to this point. Fortunately there is no signs of active infection at this point she has completed roughly 2 weeks ago a 2-week prescription of doxycycline. The patient is currently on Eliquis due to a history of DVT She also has cancer that the chemotherapy is on hold right now I do not believe that s. the chemotherapy was likely providing a huge issue as far as redness from healed lesion I think it is probably more likely that she does has a  deeper wound than what is obvious and that needs to be addressed. Patient also has a history of multiple sites of carcinoma including her breast among other things. She also has mild dementia for which she is on medication. 02/09/2020 on evaluation today patient appears to be doing decently well in regard to her leg ulcer. This is looking a little bit better as far as the overall cleanliness of the wound is concerned today. With that being said there does not appear to be any sign of active infection at this time which is great news. I do believe with the alginate they done better than with the DuoDERM previous. 02/16/2020 on evaluation today patient appears to actually be doing okay in regard to her wound. She does have a little bit of a scab over this area which I am afraid is actually collecting some fluid underneath initially her and her husband were wanting to not remove this. With that being said I feel like it is good to be of utmost importance to remove it as I do believe should be trapping fluid underneath. In the end they agreed to do what I felt was best. 03/01/2020 on evaluation today patient actually appears to be doing excellent infection appears to be from intensive purposes healed. I do not see any obvious signs of an open wound at this time though nonetheless I do believe she does likely still have a small open area remaining at this point. There is no evidence of active infection currently which is great news. No fevers, chills, nausea, vomiting, or diarrhea. 03/15/2020 upon evaluation today patient appears to be doing excellent in regard to her  leg ulcer. There does not appear to be any signs of active infection at this time in fact I really feel like this is healed. There is just a small scab over the area in question but it so tightly adhered the number for a try and remove this would actually cause trauma to any healing at this point. For that reason I suggested that we actually hold  off on doing this I think it is likely healed I am to call it so and then I am and have the patient and her husband continue to monitor this at home. If she has any issues then she would obviously contact the office and let me know in that regard. Objective Constitutional Well-nourished and well-hydrated in no acute distress. Vitals Time Taken: 1:21 PM, Height: 59 in, Weight: 108 lbs, BMI: 21.8, Temperature: 97.2 F, Pulse: 67 bpm, Respiratory Rate: 20 breaths/min, Blood Pressure: 144/70 mmHg. Respiratory normal breathing without difficulty. Psychiatric this patient is able to make decisions and demonstrates good insight into disease process. Alert and Oriented x 3. pleasant and cooperative. General Notes: Patient's wound bed actually appears to be completely healed based on what I am seeing I do not see any signs of active infection at this time. Integumentary (Hair, Skin) Wound #1 status is Healed - Epithelialized. Original cause of wound was Trauma. The wound is located on the Left,Anterior Lower Leg. The wound measures 0cm length x 0cm width x 0cm depth; 0cm^2 area and 0cm^3 volume. There is Fat Layer (Subcutaneous Tissue) exposed. There is no tunneling or undermining noted. There is a small amount of serosanguineous drainage noted. The wound margin is epibole. There is no granulation within the wound bed. There is no necrotic tissue within the wound bed. General Notes: fibrin noted over wound bed. Assessment Active Problems ICD-10 Venous insufficiency (chronic) (peripheral) Non-pressure chronic ulcer of other part of left lower leg with fat layer exposed Vascular dementia without behavioral disturbance Personal history of malignant neoplasm, unspecified Plan Discharge From [facility]: Discharge from [place] Skin Barriers/Peri-Wound Care: Moisturizing lotion - to leg daily Wound Cleansing: May shower and wash wound with soap and water. Edema Control: Avoid standing  for long periods of time Elevate legs to the level of the heart or above for 30 minutes daily and/or when sitting, a frequency of: Exercise regularly 1. At this point I would recommend that we discontinue wound care services as the patient appears to be completely healed she is in agreement with that plan as is her husband. They will continue to monitor make sure nothing starts to open or draining if it does and they should contact the office and let me know. 2. I am also can recommend that she continue to keep this covered just with her long pair pants I think that sufficient and she needs to make sure not to hit this on anything. We will see her back for follow-up visit as needed. Electronic Signature(s) Signed: 03/15/2020 2:08:00 PM By: [name] PA-C Entered By: [name] on 03/15/2020 14:07:59 -------------------------------------------------------------------------------- SuperBill Details Patient Name: Date of Service: UPTO [name] 03/15/2020 Medical Record Number: [id number] Patient Account Number: [id number] Date of Birth/Sex: Treating RN: 07/13/1940 (79 y.o. [name] Primary Care Provider: Shon Sandoval Other Clinician: Referring Provider: Treating Provider/Extender: [name] in Treatment: 7 Diagnosis Coding ICD-10 Codes Code Description I87.2 Venous insufficiency (chronic) (peripheral) L97.822 Non-pressure chronic ulcer of other part of left lower leg with  fat layer exposed F01.50 Vascular dementia without behavioral disturbance Z85.9 Personal history of malignant neoplasm, unspecified Physician Procedures : CPT4 Code Description Modifier [id number] - WC PHYS LEVEL 3 - EST PT ICD-10 Diagnosis Description I87.2 Venous insufficiency (chronic) (peripheral) L97.822 Non-pressure chronic ulcer of other part of left lower leg with fat layer exposed F01.50  Vascular dementia without behavioral disturbance Z85.9 Personal history of  malignant neoplasm, unspecified Quantity: 1 Electronic Signature(s) Signed: 03/15/2020 2:08:11 PM By: [name] PA-C Entered By: [name] on 03/15/2020 14:08:10

## 2020-03-20 ENCOUNTER — Telehealth: Payer: Self-pay

## 2020-03-20 NOTE — Telephone Encounter (Signed)
Could be due to dry skin. She can try vaseline for a few days and see if that would help

## 2020-03-20 NOTE — Telephone Encounter (Signed)
Called and given below message. She verbalized understanding. 

## 2020-03-20 NOTE — Telephone Encounter (Signed)
She called and left a message to call her back.  Called back. She is complaining of itching in genital area. No rash, no discharge, no fever and no other symptoms. Denies new detergent or soap. She is asking what she should do? Or should she call PCP?

## 2020-03-22 DIAGNOSIS — D649 Anemia, unspecified: Secondary | ICD-10-CM | POA: Diagnosis not present

## 2020-03-23 ENCOUNTER — Other Ambulatory Visit: Payer: Self-pay | Admitting: Neurology

## 2020-04-20 DIAGNOSIS — L03116 Cellulitis of left lower limb: Secondary | ICD-10-CM | POA: Diagnosis not present

## 2020-04-21 ENCOUNTER — Inpatient Hospital Stay: Payer: Medicare Other | Attending: Hematology and Oncology

## 2020-04-21 ENCOUNTER — Other Ambulatory Visit: Payer: Self-pay

## 2020-04-21 DIAGNOSIS — Z95828 Presence of other vascular implants and grafts: Secondary | ICD-10-CM

## 2020-04-21 DIAGNOSIS — Z853 Personal history of malignant neoplasm of breast: Secondary | ICD-10-CM

## 2020-04-21 DIAGNOSIS — C7951 Secondary malignant neoplasm of bone: Secondary | ICD-10-CM | POA: Insufficient documentation

## 2020-04-21 DIAGNOSIS — C541 Malignant neoplasm of endometrium: Secondary | ICD-10-CM | POA: Diagnosis not present

## 2020-04-21 DIAGNOSIS — C3491 Malignant neoplasm of unspecified part of right bronchus or lung: Secondary | ICD-10-CM | POA: Insufficient documentation

## 2020-04-21 DIAGNOSIS — D701 Agranulocytosis secondary to cancer chemotherapy: Secondary | ICD-10-CM

## 2020-04-21 MED ORDER — HEPARIN SOD (PORK) LOCK FLUSH 100 UNIT/ML IV SOLN
250.0000 [IU] | Freq: Once | INTRAVENOUS | Status: AC
  Administered 2020-04-21: 500 [IU]
  Filled 2020-04-21: qty 5

## 2020-04-21 MED ORDER — SODIUM CHLORIDE 0.9% FLUSH
10.0000 mL | Freq: Once | INTRAVENOUS | Status: AC
  Administered 2020-04-21: 10 mL
  Filled 2020-04-21: qty 10

## 2020-04-21 NOTE — Patient Instructions (Signed)

## 2020-05-16 ENCOUNTER — Ambulatory Visit: Payer: Medicare Other | Admitting: Hematology and Oncology

## 2020-05-16 ENCOUNTER — Other Ambulatory Visit: Payer: Medicare Other

## 2020-05-22 DIAGNOSIS — N1831 Chronic kidney disease, stage 3a: Secondary | ICD-10-CM | POA: Diagnosis not present

## 2020-05-23 DIAGNOSIS — L309 Dermatitis, unspecified: Secondary | ICD-10-CM | POA: Diagnosis not present

## 2020-06-07 ENCOUNTER — Encounter: Payer: Self-pay | Admitting: Neurology

## 2020-06-07 ENCOUNTER — Ambulatory Visit (INDEPENDENT_AMBULATORY_CARE_PROVIDER_SITE_OTHER): Payer: Medicare Other | Admitting: Neurology

## 2020-06-07 ENCOUNTER — Other Ambulatory Visit: Payer: Self-pay

## 2020-06-07 VITALS — BP 161/94 | HR 87 | Ht 59.0 in | Wt 126.0 lb

## 2020-06-07 DIAGNOSIS — R413 Other amnesia: Secondary | ICD-10-CM | POA: Diagnosis not present

## 2020-06-07 DIAGNOSIS — C541 Malignant neoplasm of endometrium: Secondary | ICD-10-CM | POA: Diagnosis not present

## 2020-06-07 MED ORDER — DONEPEZIL HCL 5 MG PO TABS: 5.0000 mg | ORAL_TABLET | Freq: Every day | ORAL | 5 refills | Status: DC

## 2020-06-07 MED ORDER — MEMANTINE HCL 10 MG PO TABS: 10.0000 mg | ORAL_TABLET | Freq: Two times a day (BID) | ORAL | 5 refills | Status: DC

## 2020-06-07 NOTE — Progress Notes (Signed)
I have read the note, and I agree with the clinical assessment and plan.  [name] K [name]   

## 2020-06-07 NOTE — Patient Instructions (Signed)
Start aricept 5 mg at bedtime  Keep namenda 10 mg twice daily Consider coming off Remeron, 36 lb weight gain since last visit See you back in 6 months  Donepezil tablets What is this medicine? DONEPEZIL (doe NEP e zil) is used to treat mild to moderate dementia caused by Alzheimer's disease. This medicine may be used for other purposes; ask your health care provider or pharmacist if you have questions. COMMON BRAND NAME(S): Aricept What should I tell my health care provider before I take this medicine? They need to know if you have any of these conditions:  asthma or other lung disease  difficulty passing urine  head injury  heart disease  history of irregular heartbeat  liver disease  seizures (convulsions)  stomach or intestinal disease, ulcers or stomach bleeding  an unusual or allergic reaction to donepezil, other medicines, foods, dyes, or preservatives  pregnant or trying to get pregnant  breast-feeding How should I use this medicine? Take this medicine by mouth with a glass of water. Follow the directions on the prescription label. You may take this medicine with or without food. Take this medicine at regular intervals. This medicine is usually taken before bedtime. Do not take it more often than directed. Continue to take your medicine even if you feel better. Do not stop taking except on your doctor's advice. If you are taking the 23 mg donepezil tablet, swallow it whole; do not cut, crush, or chew it. Talk to your pediatrician regarding the use of this medicine in children. Special care may be needed. Overdosage: If you think you have taken too much of this medicine contact a poison control center or emergency room at once. NOTE: This medicine is only for you. Do not share this medicine with others. What if I miss a dose? If you miss a dose, take it as soon as you can. If it is almost time for your next dose, take only that dose, do not take double or extra  doses. What may interact with this medicine? Do not take this medicine with any of the following medications:  certain medicines for fungal infections like itraconazole, fluconazole, posaconazole, and voriconazole  cisapride  dextromethorphan; quinidine  dronedarone  pimozide  quinidine  thioridazine This medicine may also interact with the following medications:  antihistamines for allergy, cough and cold  atropine  bethanechol  carbamazepine  certain medicines for bladder problems like oxybutynin, tolterodine  certain medicines for Parkinson's disease like benztropine, trihexyphenidyl  certain medicines for stomach problems like dicyclomine, hyoscyamine  certain medicines for travel sickness like scopolamine  dexamethasone  dofetilide  ipratropium  NSAIDs, medicines for pain and inflammation, like ibuprofen or naproxen  other medicines for Alzheimer's disease  other medicines that prolong the QT interval (cause an abnormal heart rhythm)  phenobarbital  phenytoin  rifampin, rifabutin or rifapentine  ziprasidone This list may not describe all possible interactions. Give your health care provider a list of all the medicines, herbs, non-prescription drugs, or dietary supplements you use. Also tell them if you smoke, drink alcohol, or use illegal drugs. Some items may interact with your medicine. What should I watch for while using this medicine? Visit your doctor or health care professional for regular checks on your progress. Check with your doctor or health care professional if your symptoms do not get better or if they get worse. You may get drowsy or dizzy. Do not drive, use machinery, or do anything that needs mental alertness until you know how this  drug affects you. What side effects may I notice from receiving this medicine? Side effects that you should report to your doctor or health care professional as soon as possible:  allergic reactions like  skin rash, itching or hives, swelling of the face, lips, or tongue  feeling faint or lightheaded, falls  loss of bladder control  seizures  signs and symptoms of a dangerous change in heartbeat or heart rhythm like chest pain; dizziness; fast or irregular heartbeat; palpitations; feeling faint or lightheaded, falls; breathing problems  signs and symptoms of infection like fever or chills; cough; sore throat; pain or trouble passing urine  signs and symptoms of liver injury like dark yellow or brown urine; general ill feeling or flu-like symptoms; light-colored stools; loss of appetite; nausea; right upper belly pain; unusually weak or tired; yellowing of the eyes or skin  slow heartbeat or palpitations  unusual bleeding or bruising  vomiting Side effects that usually do not require medical attention (report to your doctor or health care professional if they continue or are bothersome):  diarrhea, especially when starting treatment  headache  loss of appetite  muscle cramps  nausea  stomach upset This list may not describe all possible side effects. Call your doctor for medical advice about side effects. You may report side effects to FDA at 1-800-FDA-1088. Where should I keep my medicine? Keep out of reach of children. Store at room temperature between 15 and 30 degrees C (59 and 86 degrees F). Throw away any unused medicine after the expiration date. NOTE: This sheet is a summary. It may not cover all possible information. If you have questions about this medicine, talk to your doctor, pharmacist, or health care provider.  2021 [employer]vier/Gold Standard (2018-04-20 10:33:41)

## 2020-06-07 NOTE — Progress Notes (Signed)
PATIENT: [name] DOB: 13-Nov-1940  REASON FOR VISIT: follow up HISTORY FROM: patient  HISTORY OF PRESENT ILLNESS: Today 06/07/20 [name] is an 80 year old female with history of memory disturbance. Has history of endometrial cancer and lung cancer, tolerated chemo poorly (had DVT, now on Xarelto), under close observation, no signs of recurrent disease. CT scan of the brain has shown a significant degree of small vessel disease, could contribute to her memory or balance issues. She is on Namenda. Memory change has been over the last year, trouble recalling events, misplaces things. Weight is up about 36 lbs since July, now 126 lbs. on low-dose Remeron.  Has swelling to both lower extremities, post blood clot.  Has been placed on Lasix.  Is not able to do much walking due to the swelling.  Has tolerated Namenda.  Continues to remain active, and book club, and a politics group.  She and her husband manage the household, she does cooking.  She drives without difficulty and manages her medications.  MMSE 26/30 today. She doesn't think anything wrong with her memory, is more related to the pandemic, not being active/social.  Here today for evaluation accompanied by her husband.  HISTORY 12/02/2019 Dr. Jannifer Franklin: [name] is a 80 year old right-handed white female with a history of endometrial cancer and lung cancer.  The patient over the last year has had significant issues with maintaining her appetite, she has had a 20 pound weight loss.  She also has been recently placed on Xarelto for a deep venous thrombosis.  She has made good efforts to try to increase her dietary intake to gain weight.  She has had some chronic issues with diarrhea.  Over the last year, she has also had some gradual change in her memory, this is confirmed by her husband who is present today.  The patient has had some short-term memory issues, she has difficulty recalling recent events.  She is keeping up with her own  medications and appointments, she does operate a motor vehicle without difficulty.  She has not given up any activities of daily living because of her memory.  She is able to cook and manage the kitchen fairly well.  She does not do the finances and never has.  She sleeps fairly well at night but does have a low energy level during the day.  She has some urinary urgency as well as the diarrhea, she also reports some troubles with balance but she has not had any falls.  She denies any family history of individuals with memory problems as they got older.  She does misplace things about the house frequently.  She has undergone a CT scan of the brain recently that shows extensive white matter changes that are chronic in nature.  She comes to this office for further evaluation.   REVIEW OF SYSTEMS: Out of a complete 14 system review of symptoms, the patient complains only of the following symptoms, and all other reviewed systems are negative.  Memory loss  ALLERGIES: Allergies  Allergen Reactions  . Ace Inhibitors Anaphylaxis    Shortness of breath   . Dilaudid [Hydromorphone Hcl] Other (See Comments)    "total loss of her mind"  . Codeine Nausea Only    HOME MEDICATIONS: Outpatient Medications Prior to Visit  Medication Sig Dispense Refill  . atenolol (TENORMIN) 25 MG tablet Take 25 mg by mouth 2 (two) times daily.    . furosemide (LASIX) 20 MG tablet Take 1 tablet (20 mg  total) by mouth daily. 10 tablet 0  . HYDROcodone-acetaminophen (NORCO/VICODIN) 5-325 MG tablet Take 1 tablet by mouth every 4 (four) hours as needed. 12 tablet 0  . lidocaine-prilocaine (EMLA) cream Apply to [facility] cath 1-2 hours prior to access as directed 30 g 1  . LORazepam (ATIVAN) 0.5 MG tablet 1 tablet po 30 minutes prior to radiation or MRI 30 tablet 0  . mirtazapine (REMERON) 7.5 MG tablet TAKE 1 TABLET (7.5 MG TOTAL) BY MOUTH AT BEDTIME. 90 tablet 2  . Multiple Vitamin (MULTIVITAMIN WITH MINERALS) TABS tablet Take 1  tablet by mouth daily.    . potassium chloride (KLOR-CON) 10 MEQ tablet Take 1 tablet (10 mEq total) by mouth daily. 10 tablet 0  . rivaroxaban (XARELTO) 10 MG TABS tablet Take 1 tablet (10 mg total) by mouth daily. 30 tablet 11  . simvastatin (ZOCOR) 20 MG tablet Take 20 mg by mouth every evening.     . memantine (NAMENDA) 5 MG tablet Take 2 tablets twice a day. 360 tablet 1   Facility-Administered Medications Prior to Visit  Medication Dose Route Frequency Provider Last Rate Last Admin  . heparin lock flush 100 unit/mL  500 Units Intracatheter Once Gorsuch, Ni, MD      . sodium chloride flush (NS) 0.9 % injection 10 mL  10 mL Intracatheter Once Heath Lark, MD        PAST MEDICAL HISTORY: Past Medical History:  Diagnosis Date  . Endometrial cancer ([place]) 12/2007   s/p total abdominal hysterectomy at [facility] - ovaries were also removed   . Family history of breast cancer   . History of breast cancer    right breast -- treated with lumpectomy and radiation therarpy, postoperatively with tamoxifen   . History of radiation therapy 10/22/2016 to 10/28/2016  . Hypercholesterolemia   . Hyperlipidemia   . Hypertension   . Palpitations   . Personal history of radiation therapy 2006  . PVC's (premature ventricular contractions)   . Radiation 07/11/14-08/12/14   left lumbar paraspinal area 55 gray  . S/P radiation therapy 10/28/11-12/05/11   5040 cGy left pelvis  . S/P radiation therapy    Intracavitary brachytherapy of uterus    PAST SURGICAL HISTORY: Past Surgical History:  Procedure Laterality Date  . APPENDECTOMY    . BREAST LUMPECTOMY Right 2000  . COLONOSCOPY     3X  . FOOT SURGERY     RIGHT  . SEGMENTECOMY Right 12/07/2018   Procedure: RIGHT LOWER LOBE SUPERIOR SEGMENTECTOMY;  Surgeon: Melrose Nakayama, MD;  Location: [facility];  Service: Thoracic;  Laterality: Right;  . TONSILLECTOMY    . TOTAL ABDOMINAL HYSTERECTOMY W/ BILATERAL SALPINGOOPHORECTOMY    . VIDEO ASSISTED  THORACOSCOPY Right 12/07/2018   Procedure: VIDEO ASSISTED THORACOSCOPY;  Surgeon: Melrose Nakayama, MD;  Location: [facility] OR;  Service: Thoracic;  Laterality: Right;    FAMILY HISTORY: Family History  Problem Relation Age of Onset  . Thrombosis Father        coronary thrombosis  . Hypertension Father   . Heart failure Mother   . Coronary artery disease Brother   . Breast cancer Maternal Aunt        dx in her 73s  . Stroke Maternal Uncle   . Stroke Maternal Grandfather     SOCIAL HISTORY: Social History   Socioeconomic History  . Marital status: Married    Spouse name: [name]  . Number of children: 2  . Years of education: Not on file  .  Highest education level: Not on file  Occupational History  . Occupation: retired  Tobacco Use  . Smoking status: Former Smoker    Types: Cigarettes    Quit date: 05/13/1962    Years since quitting: 58.1  . Smokeless tobacco: Never Used  Vaping Use  . Vaping Use: Never used  Substance and Sexual Activity  . Alcohol use: Yes    Comment: occasionally  . Drug use: No  . Sexual activity: Not Currently    Birth control/protection: Post-menopausal  Other Topics Concern  . Not on file  Social History Narrative  . Not on file   Social Determinants of Health   Financial Resource Strain: Not on file  Food Insecurity: Not on file  Transportation Needs: Not on file  Physical Activity: Not on file  Stress: Not on file  Social Connections: Not on file  Intimate Partner Violence: Not on file   PHYSICAL EXAM  Vitals:   06/07/20 1047  BP: (!) 161/94  Pulse: 87  Weight: 126 lb (57.2 kg)  Height: 4\' 11"  (1.499 m)   Body mass index is 25.45 kg/m.  Generalized: Well developed, in no acute distress, well-appearing MMSE - Mini Mental State Exam 06/07/2020 12/02/2019  Orientation to time 4 5  Orientation to Place 5 5  Registration 3 3  Attention/ Calculation 5 4  Recall 0 0  Language- name 2 objects 2 2  Language- repeat 1 1  Language-  follow 3 step command 3 3  Language- read & follow direction 1 1  Write a sentence 1 1  Copy design 1 1  Copy design-comments - 13animals  Total score 26 26    Neurological examination  Mentation: Alert oriented to time, place, history taking. Follows all commands speech and language fluent Cranial nerve II-XII: Pupils were equal round reactive to light. Extraocular movements were full, visual field were full on confrontational test. Facial sensation and strength were normal. Head turning and shoulder shrug  were normal and symmetric. Motor: The motor testing reveals 5 over 5 strength of all 4 extremities. Good symmetric motor tone is noted throughout.  2+ pitting edema right lower extremity Sensory: Sensory testing is intact to soft touch on all 4 extremities. No evidence of extinction is noted.  Coordination: Cerebellar testing reveals good finger-nose-finger and heel-to-shin bilaterally.  Gait and station: Gait is normal. Reflexes: Deep tendon reflexes are symmetric and normal bilaterally.   DIAGNOSTIC DATA (LABS, IMAGING, TESTING) - I reviewed patient records, labs, notes, testing and imaging myself where available.  Lab Results  Component Value Date   WBC 6.5 03/09/2020   HGB 9.7 (L) 03/09/2020   HCT 31.6 (L) 03/09/2020   MCV 91.6 03/09/2020   PLT 159 03/09/2020      Component Value Date/Time   NA 142 03/09/2020 1057   NA 140 05/09/2017 0844   K 3.8 03/09/2020 1057   K 4.1 05/09/2017 0844   CL 107 03/09/2020 1057   CO2 26 03/09/2020 1057   CO2 26 05/09/2017 0844   GLUCOSE 79 03/09/2020 1057   GLUCOSE 82 05/09/2017 0844   BUN 22 03/09/2020 1057   BUN 20.7 05/09/2017 0844   CREATININE 0.86 03/09/2020 1057   CREATININE 0.7 05/09/2017 0844   CALCIUM 9.2 03/09/2020 1057   CALCIUM 9.3 05/09/2017 0844   PROT 6.3 (L) 03/09/2020 1057   PROT 6.8 05/09/2017 0844   ALBUMIN 3.2 (L) 03/09/2020 1057   ALBUMIN 3.6 05/09/2017 0844   AST 40 03/09/2020 1057   AST  27 05/09/2017  0844   ALT 34 03/09/2020 1057   ALT 17 05/09/2017 0844   ALKPHOS 52 03/09/2020 1057   ALKPHOS 43 05/09/2017 0844   BILITOT 0.6 03/09/2020 1057   BILITOT 0.81 05/09/2017 0844   GFRNONAA >60 03/09/2020 1057   GFRAA >60 02/07/2020 1109   Lab Results  Component Value Date   CHOL 220 (H) 12/28/2010   HDL 90.00 12/28/2010   LDLDIRECT 114.5 12/28/2010   TRIG 107.0 12/28/2010   CHOLHDL 2 12/28/2010   No results found for: HGBA1C Lab Results  Component Value Date   VITAMINB12 547 12/02/2019   Lab Results  Component Value Date   TSH 1.261 03/09/2020    ASSESSMENT AND PLAN 80 y.o. year old female  has a past medical history of Endometrial cancer ([place]) (12/2007), Family history of breast cancer, History of breast cancer, History of radiation therapy (10/22/2016 to 10/28/2016), Hypercholesterolemia, Hyperlipidemia, Hypertension, Palpitations, Personal history of radiation therapy (2006), PVC's (premature ventricular contractions), Radiation (07/11/14-08/12/14), S/P radiation therapy (10/28/11-12/05/11), and S/P radiation therapy. here with:  1.  Mild cognitive impairment -MMSE 26/30 today -Continue Namenda 10 mg twice a day -Add on Aricept 5 mg at bedtime (denies diarrhea, weight is up 36 lbs)  2.  History of endometrial and lung cancer -Sees Dr. Alvy Bimler at [facility] -For now off treatment  3.  Weight loss -Weight increased 36 pounds since July, some due to fluid in lower extremities, on Lasix, likely doesn't need the Remeron anymore, she'll check with Dr. Alvy Bimler  4.  Extensive small vessel disease by CT brain -Remains on Xarelto  I will see her back in 6 months or sooner if needed. She hopes to get back into walking for exercise when her lower extremity swelling improves.  I spent 30 minutes of face-to-face and non-face-to-face time with patient.  This included previsit chart review, lab review, study review, order entry, electronic health record documentation, patient  education.   Butler Denmark, AGNP-C, DNP 06/07/2020, 11:16 AM [facility] [address], [place], [place] [address] [phone]

## 2020-06-12 DIAGNOSIS — R413 Other amnesia: Secondary | ICD-10-CM | POA: Diagnosis not present

## 2020-06-12 DIAGNOSIS — I82401 Acute embolism and thrombosis of unspecified deep veins of right lower extremity: Secondary | ICD-10-CM | POA: Diagnosis not present

## 2020-06-12 DIAGNOSIS — Z853 Personal history of malignant neoplasm of breast: Secondary | ICD-10-CM | POA: Diagnosis not present

## 2020-06-12 DIAGNOSIS — R600 Localized edema: Secondary | ICD-10-CM | POA: Diagnosis not present

## 2020-06-12 DIAGNOSIS — I129 Hypertensive chronic kidney disease with stage 1 through stage 4 chronic kidney disease, or unspecified chronic kidney disease: Secondary | ICD-10-CM | POA: Diagnosis not present

## 2020-06-12 DIAGNOSIS — L03116 Cellulitis of left lower limb: Secondary | ICD-10-CM | POA: Diagnosis not present

## 2020-06-12 DIAGNOSIS — Z85118 Personal history of other malignant neoplasm of bronchus and lung: Secondary | ICD-10-CM | POA: Diagnosis not present

## 2020-06-12 DIAGNOSIS — Z7901 Long term (current) use of anticoagulants: Secondary | ICD-10-CM | POA: Diagnosis not present

## 2020-06-12 DIAGNOSIS — I2510 Atherosclerotic heart disease of native coronary artery without angina pectoris: Secondary | ICD-10-CM | POA: Diagnosis not present

## 2020-06-12 DIAGNOSIS — E46 Unspecified protein-calorie malnutrition: Secondary | ICD-10-CM | POA: Diagnosis not present

## 2020-06-12 DIAGNOSIS — N1831 Chronic kidney disease, stage 3a: Secondary | ICD-10-CM | POA: Diagnosis not present

## 2020-06-12 DIAGNOSIS — Z8542 Personal history of malignant neoplasm of other parts of uterus: Secondary | ICD-10-CM | POA: Diagnosis not present

## 2020-06-14 ENCOUNTER — Other Ambulatory Visit (HOSPITAL_COMMUNITY): Payer: Self-pay | Admitting: Internal Medicine

## 2020-06-14 ENCOUNTER — Other Ambulatory Visit: Payer: Self-pay

## 2020-06-14 ENCOUNTER — Ambulatory Visit (HOSPITAL_COMMUNITY)
Admission: RE | Admit: 2020-06-14 | Discharge: 2020-06-14 | Disposition: A | Discharge status: Home / Self Care | Payer: Medicare Other | Source: Ambulatory Visit | Attending: Internal Medicine | Admitting: Internal Medicine

## 2020-06-14 DIAGNOSIS — R609 Edema, unspecified: Secondary | ICD-10-CM | POA: Diagnosis not present

## 2020-06-16 ENCOUNTER — Other Ambulatory Visit: Payer: Self-pay

## 2020-06-16 ENCOUNTER — Encounter: Payer: Self-pay | Admitting: Hematology and Oncology

## 2020-06-16 ENCOUNTER — Inpatient Hospital Stay: Payer: Medicare Other

## 2020-06-16 ENCOUNTER — Inpatient Hospital Stay (HOSPITAL_BASED_OUTPATIENT_CLINIC_OR_DEPARTMENT_OTHER): Payer: Medicare Other | Admitting: Hematology and Oncology

## 2020-06-16 ENCOUNTER — Inpatient Hospital Stay: Payer: Medicare Other | Attending: Hematology and Oncology

## 2020-06-16 VITALS — BP 159/67 | HR 77 | Temp 98.0°F | Resp 18 | Ht 59.0 in | Wt 123.2 lb

## 2020-06-16 DIAGNOSIS — Z7901 Long term (current) use of anticoagulants: Secondary | ICD-10-CM | POA: Diagnosis not present

## 2020-06-16 DIAGNOSIS — I700 Atherosclerosis of aorta: Secondary | ICD-10-CM | POA: Insufficient documentation

## 2020-06-16 DIAGNOSIS — Z171 Estrogen receptor negative status [ER-]: Secondary | ICD-10-CM | POA: Insufficient documentation

## 2020-06-16 DIAGNOSIS — R600 Localized edema: Secondary | ICD-10-CM

## 2020-06-16 DIAGNOSIS — D701 Agranulocytosis secondary to cancer chemotherapy: Secondary | ICD-10-CM

## 2020-06-16 DIAGNOSIS — C3491 Malignant neoplasm of unspecified part of right bronchus or lung: Secondary | ICD-10-CM | POA: Diagnosis not present

## 2020-06-16 DIAGNOSIS — Z885 Allergy status to narcotic agent status: Secondary | ICD-10-CM | POA: Diagnosis not present

## 2020-06-16 DIAGNOSIS — C541 Malignant neoplasm of endometrium: Secondary | ICD-10-CM

## 2020-06-16 DIAGNOSIS — I2510 Atherosclerotic heart disease of native coronary artery without angina pectoris: Secondary | ICD-10-CM | POA: Insufficient documentation

## 2020-06-16 DIAGNOSIS — Z79899 Other long term (current) drug therapy: Secondary | ICD-10-CM | POA: Diagnosis not present

## 2020-06-16 DIAGNOSIS — Z95828 Presence of other vascular implants and grafts: Secondary | ICD-10-CM

## 2020-06-16 DIAGNOSIS — C7951 Secondary malignant neoplasm of bone: Secondary | ICD-10-CM | POA: Insufficient documentation

## 2020-06-16 DIAGNOSIS — Z853 Personal history of malignant neoplasm of breast: Secondary | ICD-10-CM

## 2020-06-16 DIAGNOSIS — C3431 Malignant neoplasm of lower lobe, right bronchus or lung: Secondary | ICD-10-CM | POA: Insufficient documentation

## 2020-06-16 DIAGNOSIS — C549 Malignant neoplasm of corpus uteri, unspecified: Secondary | ICD-10-CM

## 2020-06-16 DIAGNOSIS — Z7189 Other specified counseling: Secondary | ICD-10-CM

## 2020-06-16 DIAGNOSIS — E039 Hypothyroidism, unspecified: Secondary | ICD-10-CM

## 2020-06-16 LAB — CMP (CANCER CENTER ONLY)
ALT: 22 U/L (ref 0–44)
AST: 31 U/L (ref 15–41)
Albumin: 3.6 g/dL (ref 3.5–5.0)
Alkaline Phosphatase: 59 U/L (ref 38–126)
Anion gap: 10 (ref 5–15)
BUN: 32 mg/dL — ABNORMAL HIGH (ref 8–23)
CO2: 27 mmol/L (ref 22–32)
Calcium: 9.6 mg/dL (ref 8.9–10.3)
Chloride: 105 mmol/L (ref 98–111)
Creatinine: 0.91 mg/dL (ref 0.44–1.00)
GFR, Estimated: >60 mL/min (ref >60)
Glucose, Bld: 91 mg/dL (ref 70–99)
Potassium: 3.5 mmol/L (ref 3.5–5.1)
Sodium: 142 mmol/L (ref 135–145)
Total Bilirubin: 0.6 mg/dL (ref 0.3–1.2)
Total Protein: 7 g/dL (ref 6.5–8.1)

## 2020-06-16 LAB — CBC WITH DIFFERENTIAL (CANCER CENTER ONLY)
Abs Immature Granulocytes: 0.05 10*3/uL (ref 0.00–0.07)
Basophils Absolute: 0.1 10*3/uL (ref 0.0–0.1)
Basophils Relative: 1 %
Eosinophils Absolute: 0.3 10*3/uL (ref 0.0–0.5)
Eosinophils Relative: 5 %
HCT: 35.9 % — ABNORMAL LOW (ref 36.0–46.0)
Hemoglobin: 11.5 g/dL — ABNORMAL LOW (ref 12.0–15.0)
Immature Granulocytes: 1 %
Lymphocytes Relative: 15 %
Lymphs Abs: 1 10*3/uL (ref 0.7–4.0)
MCH: 30.9 pg (ref 26.0–34.0)
MCHC: 32 g/dL (ref 30.0–36.0)
MCV: 96.5 fL (ref 80.0–100.0)
Monocytes Absolute: 1 10*3/uL (ref 0.1–1.0)
Monocytes Relative: 16 %
Neutro Abs: 4.1 10*3/uL (ref 1.7–7.7)
Neutrophils Relative %: 62 %
Platelet Count: 185 10*3/uL (ref 150–400)
RBC: 3.72 MIL/uL — ABNORMAL LOW (ref 3.87–5.11)
RDW: 16.8 % — ABNORMAL HIGH (ref 11.5–15.5)
WBC Count: 6.5 10*3/uL (ref 4.0–10.5)
nRBC: 0 % (ref 0.0–0.2)

## 2020-06-16 LAB — T4, FREE: Free T4: 1.76 ng/dL — ABNORMAL HIGH (ref 0.61–1.12)

## 2020-06-16 LAB — TSH: TSH: 1.875 u[IU]/mL (ref 0.308–3.960)

## 2020-06-16 MED ORDER — HEPARIN SOD (PORK) LOCK FLUSH 100 UNIT/ML IV SOLN
500.0000 [IU] | Freq: Once | INTRAVENOUS | Status: AC
  Administered 2020-06-16: 500 [IU]
  Filled 2020-06-16: qty 5

## 2020-06-16 MED ORDER — SODIUM CHLORIDE 0.9% FLUSH
10.0000 mL | INTRAVENOUS | Status: DC | PRN
  Administered 2020-06-16: 10 mL via INTRAVENOUS
  Filled 2020-06-16: qty 10

## 2020-06-16 NOTE — Assessment & Plan Note (Signed)
Since discontinuation of treatment, there is no definitive signs of residual disease seen on PET CT scan She tolerated chemotherapy very poorly We will take a treatment break and I will continue to monitor her closely Overall, she is improving dramatically with weight gain and near complete resolution of her anemia I will see her again in 2 months for further follow-up with imaging study

## 2020-06-16 NOTE — Assessment & Plan Note (Signed)
Last last PET CT scan showed no evidence of recurrence of cancer Observe closely with serial imaging study for now

## 2020-06-16 NOTE — Assessment & Plan Note (Signed)
She has persistent bilateral chronic lower extremity edema Continue close observation She is on diuretic therapy under the guidance from her primary care doctor

## 2020-06-16 NOTE — Progress Notes (Signed)
[facility] OFFICE PROGRESS NOTE  Patient Care Team: Creola Corn, MD as PCP - General (Internal Medicine) Artis Delay, MD as Consulting Physician (Hematology and Oncology)  ASSESSMENT & PLAN:  Primary lung cancer, right [facility]) Last last PET CT scan showed no evidence of recurrence of cancer Observe closely with serial imaging study for now  Endometrial cancer [facility]) Since discontinuation of treatment, there is no definitive signs of residual disease seen on PET CT scan She tolerated chemotherapy very poorly We will take a treatment break and I will continue to monitor her closely Overall, she is improving dramatically with weight gain and near complete resolution of her anemia I will see her again in 2 months for further follow-up with imaging study  Bilateral edema of lower extremity She has persistent bilateral chronic lower extremity edema Continue close observation She is on diuretic therapy under the guidance from her primary care doctor   Orders Placed This Encounter  Procedures  . NM PET Image Restage (PS) Skull Base to Thigh    Standing Status:   Future    Standing Expiration Date:   06/16/2021    Order Specific Question:   If indicated for the ordered procedure, I authorize the administration of a radiopharmaceutical per Radiology protocol    Answer:   Yes    Order Specific Question:   Preferred imaging location?    Answer:   [facility]    Order Specific Question:   Radiology Contrast Protocol - do NOT remove file path    Answer:   [email or web address]    All questions were answered. The patient knows to call the clinic with any problems, questions or concerns. The total time spent in the appointment was 20 minutes encounter with patients including review of chart and various tests results, discussions about plan of care and coordination of care plan   Artis Delay, MD 06/16/2020 1:06 PM  INTERVAL  HISTORY: Please see below for problem oriented charting. She returns for further follow-up She is doing well and gaining weight Her appetite is good Her only symptoms are lower extremity edema She is on diuretic therapy No recent bleeding  SUMMARY OF ONCOLOGIC HISTORY: Oncology History Overview Note  Biopsy of recurrence 06-2014 ER negative. PDL1 testing low at 2% on the uterine cancer She progressed on carboplatin and Paclitaxel for uterine cancer HER 2 negative on pathology [id number] Negative genetic testing  On her lung cancer sample, [employer] testing revealed 10% PD-1 positive EGFR mutation was detected, exon 19 deletion   Endometrial cancer (HCC)  12/01/2007 Initial Diagnosis   She has recurrent papillary serous endometrial carcinoma. Briefly she was diagnosed in 2009 with a FIGO IB pappilary serous carcinoma treated with hysterectomy followed by adjuvant chemo and vaginal brachytherapy. She then had a recurrence diagnosed in late 2012 that was deemed not to be surgically resectable, and was treated with 6 cycles of carbo/taxol followed by 5040 cGy of EBRT to the pelvic mass. She was then followed with serial imaging and was noted in June of this year to have slight increase in the size of the mass, and again in September there was small enlargement of the mass. She had a re-staging PET scan on 03/10/13 that showed FDG activity in the pelvic mass, with no other areas of disease   12/01/2007 Imaging   CT scan of chest, abdomen and pelvis: 1.  Mass like area of decreased attenuation in the central uterus, consistent with the known endometrial carcinoma.  Deep  myometrial invasion is suspected.  Pelvic MRI without and with contrast could be performed for further staging workup if clinically warranted. 2.  No evidence of extrauterine extension or lymphadenopathy. 3.  Sigmoid diverticulosis incidentally noted.   12/15/2007 Surgery   --12/2007 laparoscopic hysterectomy and PLND --05/2008  complted 6 cycles adjuvant carbo/taxol followed by vaginal cuff brachy --03/2011 exploratory lararoscopy, ureterolysis --4/13 completed 6 cycels carbotaxol --8/13 completed EBRT to the left pelvic sidewall    06/10/2008 Imaging   CT scan abdomen and pelvis 1.  Nonspecific mildly prominent inguinal lymph nodes, right greater than left. 2.  Interval hysterectomy without evidence of recurrent pelvic mass or fluid collection.   03/22/2011 Imaging   Ct scan abdomen and pelvis 1.  Local uterine cancer recurrence with two large nodes along the left pelvic sidewall. 2.  No evidence of bowel obstruction, urinary obstruction, or more distant metastasis.   03/31/2011 Relapse/Recurrence   chemotherapy and external beam radiation   05/16/2011 - 09/17/2011 Chemotherapy   She had 6 cycles of carboplatin and Taxol    07/17/2011 Imaging   Ct scan abdomen and pelvis 1.  Interval improvement in the previously demonstrated left pelvic local recurrence of tumor. 2.  No disease progression or complication identified. 3.  Mild bladder wall thickening on the right, nonspecific and possibly related to incomplete distension and/or radiation therapy. 4.  Cholelithiasis   10/11/2011 Imaging   CT scan abdomen and pelvis 1.  Interval enlargement of the left pelvic sidewall masses. 2.  No new nodular disease in the pelvis or lymphadenopathy. 3.  No evidence  of distant metastasis. 4.  Mild hydronephrosis on the right is similar to prior. 5.  Focal thickening of the right aspect the bladder is stable compared to prior.    10/28/2011 - 12/05/2011 Radiation Therapy   radiation for pelvic sidewall recurrence   10/28/2011 - 12/05/2011 Radiation Therapy   10/28/11-12/05/11: Radiotherapy to the left pelvic sidewall   03/18/2012 Imaging   CT abdomen 1.  Today's study demonstrates a positive response to therapy with decreased size of left pelvic sidewall nodal masses, as detailed above.  No new soft tissue masses or new  lymphadenopathy is identified within the abdomen or pelvis. Continue attention on follow-up studies is recommended. 2.  Cholelithiasis without findings to suggest acute cholecystitis. 3.  Colonic diverticulosis without findings to suggest acute diverticulitis at this time. 4.  Mild asymmetric urinary bladder wall thickening is unchanged compared to prior examinations and is nonspecific.  No definite bladder wall mass is identified at this time. 5.  Additional findings, similar to prior examinations, as above   06/22/2012 Imaging   CT abdomen 1.  Further decreased size of the left pelvic peripherally enhancing lesion. 2.  No new sites of new or progressive disease. 3. Esophageal air fluid level suggests dysmotility or gastroesophageal reflux. 4.  Cholelithiasis   11/02/2012 Imaging   CT abdomen 1.  The left pelvic side wall lesion demonstrates mild increase in size from previous exam. 2.  No new sites of new or progressive disease. 3.  Cholelithiasis.    01/28/2013 Imaging   CT abdomen 1. Interval small increase in volume of enhancing mass adjacent to the left pelvic sidewall. 2. No evidence of abdominal or pelvic lymphadenopathy   04/02/2013 Surgery   pelvic mass resection with IORT   04/02/2013 - 04/02/2013 Radiation Therapy   04/02/13: Intraoperative radiotherapy to the left pelvis   05/10/2013 PET scan   1. Hypermetabolic mass in the deep left pelvis  consistent with metastasis. 2. No additional evidence of local metastasis. No evidence of distant metastasis. 3. Small right lower lobe pulmonary nodule is not hypermetabolic.   07/27/2013 Imaging   No evidence of recurrent or metastatic disease. Apparent prior resection of the deep left pelvic metastasis.   02/08/2014 Imaging   No CT findings for recurrent or metastatic disease involving the abdomen/ pelvis   06/13/2014 Relapse/Recurrence   + recurrence paraspinous muscle   06/21/2014 Procedure   There is a soft tissue lesion  along the left side of L3-L4. This lesion roughly measures up to 2.2 cm. Needle was positioned along the posterior aspect of the lesion. Small amount of air in the paraspinal tissues following needle removal   06/21/2014 Pathology Results   Bone, biopsy, left lumbar paraspinal - METASTATIC POORLY DIFFERENTIATED CARCINOMA, CONSISTENT WITH HIGH GRADE SEROUS CARCINOMA SEE COMMENT. Microscopic Comment The carcinoma demonstrates the following immunophenotype: Cytokeratin 7 - patchy moderate to strong expression Estrogen receptor - negative expression P53 - strong diffuse expression TTF-1 - negative expression WT-1 - negative expression CD56 - focal moderate strong expression Synaptophysin - negative expression GCDFP - negative expression The history of primary endometrial papillary serous carcinoma and primary mammary carcinoma is noted. In the current case, the overall morphologic and immunophenotype are that of poorly differentiated carcinoma, consistent with high grade serous carcinoma.    07/11/2014 - 08/12/2014 Radiation Therapy   07/11/14-08/12/14: 55 Gy in 25 fractions to the Lumbar spine   08/15/2014 Imaging   CT scan of chest, abdomen and pelvis 1. Since the biopsy study of 06/21/2014, similar to slight decrease in size of a left paraspinous lesion at L3-4. 2. No new sites of metastatic disease identified. 3. 8 mm ground-glass nodule in the right lower lobe is grossly similar to 03/10/2013, suggesting a benign etiology. 4. Cholelithiasis. 5. Apparent sigmoid colonic wall thickening which could be due to underdistention. Colitis felt less likely. 6.  Atherosclerosis, including within the coronary arteries. 7. Pelvic floor laxity.   10/07/2014 Imaging   CT scan of chest, abdomen and pelvis: Stable to slight interval decrease in size of left paraspinous lesion at L3-4. No new sites of metastatic disease. Unchanged ground-glass nodule within the right lower lobe, potentially benign in etiology.  Recommend attention on followup. Cholelithiasis. Sigmoid colonic diverticulosis. No CT evidence to suggest acute diverticulitis.   01/19/2015 Imaging   Ct scan of abdomen and pelvis 1. Decrease in size of left paraspinous metastasis. 2. No new sites of disease. 3. Stable ground-glass attenuating nodule within the superior segment of right lower lobe. 4. Gallstones   05/02/2015 Imaging   Ct abdomen and pelvis: Slight interval increase in size of left lumbar paraspinal soft tissue metastasis. No new sites of metastatic disease identified within the abdomen or pelvis.    05/11/2015 PET scan   1. The small soft tissue mass just lateral to the left L3 for neural foramen is hypermetabolic, favoring malignancy. 2. There is also a small focus of hypermetabolic activity between the left L1 and L2 transverse processes, but without a CT correlate. 3. The 13 mm sub solid nodule in the superior segment right lower lobe is stable from recent exams but has slowly increased in size over the last 7 years. Although not hypermetabolic, the appearance is concerning for the possibility of a low grade adenocarcinoma. 4. Other imaging findings of potential clinical significance: Chronic bilateral maxillary sinusitis. Coronary, aortic arch, and branch vessel atherosclerotic vascular disease. Aortoiliac atherosclerotic vascular disease. Cholelithiasis. Sigmoid colon diverticulosis.  Lumbar scoliosis.   07/11/2015 Imaging   Ct scan of chest, abdomen and pelvis: 1. 13 mm mixed solid and sub solid nodule in the posterior right lower lobe was present in 2009 and has clearly progressed in the interval since that study. Imaging features remain highly concerning for low-grade or well differentiated adenocarcinoma. 2. Slight increase size of in the hypermetabolic, rim enhancing nodule identified adjacent to the left L3-4 neural foramen. Metastatic disease remains a concern. 3. No evidence for discrete soft tissue lesion between  the left L1 and 2 transverse processes, the site of focal FDG uptake on the recent PET-CT. 4. Cholelithiasis. 5. Abdominal aortic atherosclerosis   08/03/2015 - 01/16/2016 Chemotherapy   She received carboplatin and Taxol   11/06/2015 PET scan   1. Hypermetabolic left H4-1 paraspinous metastasis (biopsy-proven) is stable in size and slightly decreased in metabolism. 2. Previously described small focus of hypermetabolism between the left L2 and L3 spinous processes has resolved. 3. New mild linear hypermetabolism to the left of the T11-12 spinous processes without discrete mass on the CT images, favor benign activity related activity, recommend attention on follow-up PET-CT.  4. No definite new sites of hypermetabolic metastatic disease. No recurrent hypermetabolic metastatic disease in the pelvis. 5. Interval stability of subsolid 1.3 cm superior segment right lower lobe pulmonary nodule without associated significant metabolism, which has grown compared to the 2009 chest CT study, and remain suspicious for low grade adenocarcinoma. 6. Additional findings include aortic atherosclerosis, coronary atherosclerosis, mild multinodular goiter with no hypermetabolic thyroid nodules, cholelithiasis and moderate sigmoid diverticulosis.   02/12/2016 PET scan   1. Interval increase in size and metabolic activity of the LEFT paraspinal soft tissue metastasis. 2. New activity within the musculature of the LEFT chest wall. Given the unusual location of the paraspinal metastasis cannot exclude a second soft tissue metastasis to the musculature however favor benign physiologic activity. 3. Stable RIGHT lower lobe pulmonary nodule. 4. No evidence of local recurrence.     04/08/2016 Imaging   Ct scan of chest, abdomen and pelvis: 1. Similar size of a  left paravertebral abdominal lesion. 2. No new or progressive metastatic disease. 3. No correlate for the muscular activity about the lateral left chest  wall. 4.  Coronary artery atherosclerosis. Aortic atherosclerosis. 5. Cholelithiasis. 6. Similar right lower lobe pulmonary nodule.   06/03/2016 Imaging   CT abdomen and pelvis 1. Similar to mild enlargement of a paravertebral soft tissue lesion at the L3-4 level. 2. No new sites of disease identified. 3. Cholelithiasis. 4. Hysterectomy. 5.  Aortic atherosclerosis.    09/02/2016 Imaging   CT abdomen and pelvis 1. Continue mild interval increase in size of LEFT paraspinal mass (1-2 mm). 2. No evidence of new metastatic disease in the abdomen pelvis. 3. Post hysterectomy anatomy   09/26/2016 Imaging   MR lumbar spine 1. Left paraspinal metastasis with epicenter adjacent to the left L3 neural foramen does extend into the foramen to the level of the left lateral epidural space (series 9, image 31), but also tracks cephalad and caudal along the L2-L4 lumbar plexus (series 10, image 15). No extension into the left L2 or L4 neural foramina. And no more distal extension of tumor. 2. Abnormal signal in the medial left psoas and ventral left erector spinae muscles at L2 and L3 is probably denervation related. 3. No bone invasion or osseous metastatic disease. Suspect previous radiation of the L2 through L5 spinal levels. 4. No dural or intradural metastatic disease.  10/22/2016 Procedure   She underwent stereotactic Radiosurgery   10/30/2016 Genetic Testing   Patient has genetic testing done for Inheritable genetic mutation panel. Results revealed patient has no actionable mutation.   01/21/2017 Imaging   1. Reduced size and conspicuity of the left paraspinal mass at the L3-4 level. The mass still present but has enhancement similar to that of adjacent psoas musculature. 2. No new metastatic disease is identified. 3. Other imaging findings of potential clinical significance: Cholelithiasis. Aortic Atherosclerosis (ICD10-I70.0). Sigmoid diverticulosis. Lumbar scoliosis.   05/09/2017 Imaging    Ct scan of abdomen and pelvis 1. Paraspinal mass adjacent to the left side of L3-L4 is less distinct than prior examinations, and is therefore difficult to discretely measure, however, the overall appearance suggests continued positive response to therapy. 2. No new signs of metastatic disease elsewhere in the abdomen or pelvis. 3. Aortic atherosclerosis, in addition to at least right coronary artery disease. Assessment for potential risk factor modification, dietary therapy or pharmacologic therapy may be warranted, if clinically indicated. 4. Colonic diverticulosis without evidence of acute diverticulitis at this time. 5. Additional incidental findings, as above. Aortic Atherosclerosis (ICD10-I70.0).   08/14/2017 Imaging   1. Treated left paraspinal mass centered at L3-4. Mass size is stable but there has been some evolution of tumor characteristics with less central fluid seen today. Recommend continued surveillance. 2. No evidence of untreated metastasis. 3. Degenerative changes and related impingement are described above.   10/28/2017 Imaging   Stable small left paraspinal soft tissue mass. No new or progressive disease within the abdomen or pelvis.  Cholelithiasis.  No radiographic evidence of cholecystitis.  Colonic diverticulosis, without radiographic evidence of diverticulitis.   04/30/2018 Imaging   Stable post treatment change of the partially necrotic LEFT paravertebral mass at L3-L4. 18 x 21 mm cross-section. Mass effect on the LEFT L3 nerve root redemonstrated. Enhancing and atrophic LEFT psoas is inseparable.   10/15/2018 Imaging   1. Stable post treatment size and appearance of partially necrotic left paravertebral mass at L3-4, measuring 19 x 25 mm on current exam (unchanged when measured at similar level on previous study). Mass effect on the adjacent left L3 nerve root is unchanged. Adjacent enhancing and atrophic left psoas muscle. 2. Left convex scoliosis with associated  multilevel degenerative spondylolysis and facet arthrosis, unchanged   10/29/2018 Imaging   Stable small left L3 paraspinal soft tissue mass. No evidence of new or progressive metastatic disease, or other acute findings.   Cholelithiasis.  No radiographic evidence of cholecystitis.   Colonic diverticulosis, without radiographic evidence of diverticulitis.   11/11/2018 PET scan   1. Low level metabolism (max SUV 2.0) associated with the irregular subsolid superior segment right lower lobe 2.1 cm pulmonary nodule. As better depicted on the recent diagnostic chest CT study, this nodule crosses the major fissure into the right upper lobe and has increased in size and density on multiple imaging studies back to 2009. Findings are most suggestive of primary bronchogenic adenocarcinoma. 2. No hypermetabolic thoracic adenopathy. 3. Persistent hypermetabolism (max SUV 8.0) associated with the known left L3-4 paraspinous metastasis, mildly decreased in metabolism since 02/12/2016 PET-CT. 4. No additional sites of hypermetabolic metastatic disease in the abdomen or pelvis. No metabolic evidence of peritoneal recurrence. No ascites. 5. Chronic findings include: Aortic Atherosclerosis (ICD10-I70.0). Coronary atherosclerosis. Chronic bilateral maxillary sinusitis. Cholelithiasis. Moderate sigmoid diverticulosis.   01/07/2019 - 04/04/2019 Chemotherapy   The patient had carboplatin and Alimta for chemotherapy treatment.     05/03/2019 -  Chemotherapy   The patient had Pembrolizumab and Lenvima for chemotherapy treatment.     07/23/2019 PET scan   1. 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 No new sites of hypermetabolic metastatic disease on today's study. 2. Stable appearance of surgical changes in the right lung without features to suggest local recurrence or metastatic lung cancer. 3. Cholelithiasis. 4.  Aortic Atherosclerois (ICD10-170.0)    10/20/2019 Imaging   Bilateral venous Doppler US RIGHT:  - Findings consistent with acute and occlusive deep vein thrombosis involving the right common femoral vein, right external iliac vein, right femoral vein, right popliteal vein, right posterior tibial veins, and right peroneal veins.   Unable to adequately visualize the common iliac veins due to bowel gas. The imaged portions of the IVC were patent.     LEFT:  - No evidence of common femoral vein obstruction.    11/08/2019 PET scan   1. Further reduction in activity at the left L3 level and left paraspinal tissues at L3. Maximum vertebral SUV currently 3.9, previously 4.8 on 07/23/2019 and previously 10.0 on 04/14/2019. 2. Late phase healing of previous left transverse process fractures at L3 and L4. Suspected healing right sixth rib fracture anteriorly. 3. There is new focal activity in the vicinity of the right internal jugular vein in the lower neck. Maximum SUV in this vicinity is 5.1. I not see a well-defined lymph node are thyroid lesion to corroborate with the focus, but surveillance in this region is suggested. 4. Other imaging findings of potential clinical significance: Aortic Atherosclerosis (ICD10-I70.0). Coronary atherosclerosis. Cholelithiasis. Sigmoid colon diverticulosis.   Metastatic cancer to bone (HCC)  06/27/2014 Initial Diagnosis   Metastatic cancer to bone   Primary lung cancer, right (HCC)  11/04/2018 Initial Diagnosis   Primary lung cancer, right (HCC)   11/11/2018 PET scan   1. Low level metabolism (max SUV 2.0) associated with the irregular subsolid superior segment right lower lobe 2.1 cm pulmonary nodule. As better depicted on the recent diagnostic chest CT study, this nodule crosses the major fissure into the right upper lobe and has increased in size and density on multiple imaging studies back to 2009. Findings are most suggestive of primary bronchogenic adenocarcinoma. 2. No hypermetabolic thoracic  adenopathy. 3. Persistent hypermetabolism (max SUV 8.0) associated with the known left L3-4 paraspinous metastasis, mildly decreased in metabolism since 02/12/2016 PET-CT. 4. No additional sites of hypermetabolic metastatic disease in the abdomen or pelvis. No metabolic evidence of peritoneal recurrence. No ascites. 5. Chronic findings include: Aortic Atherosclerosis (ICD10-I70.0). Coronary atherosclerosis. Chronic bilateral maxillary sinusitis. Cholelithiasis. Moderate sigmoid diverticulosis.   12/07/2018 Pathology Results   1. Lung, resection (segmental or lobe), Right Lobe Superior with endblock wedge of right upper lobe - INVASIVE ADENOCARCINOMA, MODERATELY DIFFERENTIATED, SPANNING 1.6 CM. - THE SURGICAL RESECTION MARGINS ARE NEGATIVE FOR CARCINOMA. - SEE ONCOLOGY TABLE BELOW. 2. Lymph node, biopsy, Level 9 #1 - THERE IS NO EVIDENCE OF CARCINOMA IN 1 OF 1 LYMPH NODE (0/1). 3. Lymph node, biopsy, Level 9 #2 - THERE IS NO EVIDENCE OF CARCINOMA IN 1 OF 1 LYMPH NODE (0/1). 4. Lymph node, biopsy, Level 7 #1 - METASTATIC CARCINOMA IN 1 OF 1 LYMPH NODE (1/1). 5. Lymph node, biopsy, Level 7 #2 - THERE IS NO EVIDENCE OF CARCINOMA IN 1 OF 1 LYMPH NODE (0/1). 6. Lymph node, biopsy, Level 12 #1 - THERE IS NO EVIDENCE OF CARCINOMA IN 1 OF 1 LYMPH NODE (0/1). 7. Lymph node,  biopsy, Level 12 #2 - THERE IS NO EVIDENCE OF CARCINOMA IN 1 OF 1 LYMPH NODE (0/1). 8. Lymph node, biopsy, Level 10 #1 - THERE IS NO EVIDENCE OF CARCINOMA IN 1 OF 1 LYMPH NODE (0/1). 9. Lymph node, biopsy, Level 4R #1 - THERE IS NO EVIDENCE OF CARCINOMA IN 1 OF 1 LYMPH NODE (0/1). 10. Lymph node, biopsy, Level 4R #2 - THERE IS NO EVIDENCE OF CARCINOMA IN 1 OF 1 LYMPH NODE (0/1). 11. Lymph node, biopsy, Level 2R #1 - THERE IS NO EVIDENCE OF CARCINOMA IN 1 OF 1 LYMPH NODE (0/1). 12. Lung, resection (segmental or lobe), Right Lobe Superior - BENIGN LUNG PARENCHYMA. - THERE IS NO EVIDENCE OF MALIGNANCY. Procedure:  Segmentectomy. Specimen Laterality: Right. Tumor Site: Right superior lobe. Tumor Size: 1.6 cm (gross measurement). Tumor Focality: Unifocal. Histologic Type: Adenocarcinoma, moderately differentiated. Visceral Pleura Invasion: Not identified. Lymphovascular Invasion: Not identified. Direct Invasion of Adjacent Structures: Not identified. Margins: Negative for carcinoma. Treatment Effect: N/A Regional Lymph Nodes: Number of Lymph Nodes Involved: 1 (level 7) Number of Lymph Nodes Examined: 10 Pathologic Stage Classification (pTNM, AJCC 8th Edition): pT1b, pN2 Ancillary Studies: The tumor cells are positive for Napsin-A, TTF-1, and p53. They are essentially negative for estrogen receptor, PAX-8 and progesterone receptor. Additional studies can be performed upon clinician request. Representative Tumor Block: 1D-1E. (JBK:gt, 12/09/18)   12/07/2018 Surgery   PREOPERATIVE DIAGNOSIS:  Right lung nodule.   POSTOPERATIVE DIAGNOSIS:  Non-small cell carcinoma- suspected primary lung carcinoma, clinical stage T2N01b.   PROCEDURE:   Right video-assisted thoracoscopy, Right lower lobe superior segmentectomy with en bloc wedge resection of right upper lobe, Lymph node dissection, Intercostal nerve block levels 3-9.   SURGEON:  Modesto Charon, MD   FINDINGS:  Mass palpable in posterior aspect of superior segment of right lower lobe, likely involvement across fissure into the upper lobe.  Frozen section revealed non-small cell carcinoma. upper and lower lobe margins clear.   12/23/2018 Cancer Staging   Staging form: Lung, AJCC 8th Edition - Pathologic: Stage IIIA (pT1b, pN2, cM0) - Signed by Heath Lark, MD on 12/23/2018   01/05/2019 Imaging   MRI brain 1. No metastatic disease or acute intracranial abnormality identified. 2. Small chronic parafalcine meningioma size is stable since that described in 2006 (12 x 15 mm). 3. Advanced signal changes in the cerebral white matter and to a lesser  extent pons, nonspecific but most commonly due to chronic small vessel disease. 4. Partially visible degenerative cervical spinal stenosis.     01/07/2019 -  Chemotherapy   The patient had carboplatin and Alimta for chemotherapy treatment.     04/14/2019 PET scan   1. 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 As shown on recent MRI of 04/01/2019, there is a new component of invasion into the left lower L3 vertebral body. This new component has a maximum SUV of 10.0, compatible with active malignancy. 2. Interval wedge resection of portions of the right upper lobe and right lower lobe at the site of the prior nodule. There is only minimal low-grade activity along the wedge resection clips, maximum SUV of 1.5. Surveillance is likely warranted. No new nodule identified. 3. Other imaging findings of potential clinical significance: Aortic Atherosclerosis (ICD10-I70.0). Coronary atherosclerosis. Thoracolumbar scoliosis. Cholelithiasis. Sigmoid colon diverticulosis.     05/03/2019 -  Chemotherapy   The patient had Pembrolizumab and Lenvima for chemotherapy treatment.     05/22/2019 Imaging   Ct head Atrophy, chronic microvascular disease.  No acute intracranial abnormality.     03/09/2020 PET scan   1. No evidence of malignant disease progression. 2. Similar mild metabolic activity within at the sclerotic L3 vertebral body lesion. Reduction in paraspinal muscular metabolic activity at L3 vertebral body level.     REVIEW OF SYSTEMS:   Constitutional: Denies fevers, chills or abnormal weight loss Eyes: Denies blurriness of vision Ears, nose, mouth, throat, and face: Denies mucositis or sore throat Respiratory: Denies cough, dyspnea or wheezes Cardiovascular: Denies palpitation, chest discomfort  Gastrointestinal:  Denies nausea, heartburn or change in bowel habits Skin: Denies abnormal skin rashes Lymphatics: Denies new lymphadenopathy or easy  bruising Neurological:Denies numbness, tingling or new weaknesses Behavioral/Psych: Mood is stable, no new changes  All other systems were reviewed with the patient and are negative.  I have reviewed the past medical history, past surgical history, social history and family history with the patient and they are unchanged from previous note.  ALLERGIES:  is allergic to ace inhibitors, dilaudid [hydromorphone hcl], and codeine.  MEDICATIONS:  Current Outpatient Medications  Medication Sig Dispense Refill  . atenolol (TENORMIN) 25 MG tablet Take 25 mg by mouth 2 (two) times daily.    Marland Kitchen donepezil (ARICEPT) 5 MG tablet Take 1 tablet (5 mg total) by mouth at bedtime. 30 tablet 5  . furosemide (LASIX) 20 MG tablet Take 1 tablet (20 mg total) by mouth daily. 10 tablet 0  . lidocaine-prilocaine (EMLA) cream Apply to [facility] cath 1-2 hours prior to access as directed 30 g 1  . LORazepam (ATIVAN) 0.5 MG tablet 1 tablet po 30 minutes prior to radiation or MRI 30 tablet 0  . memantine (NAMENDA) 10 MG tablet Take 1 tablet (10 mg total) by mouth 2 (two) times daily. 60 tablet 5  . mirtazapine (REMERON) 7.5 MG tablet TAKE 1 TABLET (7.5 MG TOTAL) BY MOUTH AT BEDTIME. 90 tablet 2  . Multiple Vitamin (MULTIVITAMIN WITH MINERALS) TABS tablet Take 1 tablet by mouth daily.    . potassium chloride (KLOR-CON) 10 MEQ tablet Take 1 tablet (10 mEq total) by mouth daily. 10 tablet 0  . rivaroxaban (XARELTO) 10 MG TABS tablet Take 1 tablet (10 mg total) by mouth daily. 30 tablet 11  . simvastatin (ZOCOR) 20 MG tablet Take 20 mg by mouth every evening.      No current facility-administered medications for this visit.   Facility-Administered Medications Ordered in Other Visits  Medication Dose Route Frequency Provider Last Rate Last Admin  . heparin lock flush 100 unit/mL  500 Units Intracatheter Once Alvy Bimler, Divante Kotch, MD      . sodium chloride flush (NS) 0.9 % injection 10 mL  10 mL Intracatheter Once Heath Lark, MD         PHYSICAL EXAMINATION: ECOG PERFORMANCE STATUS: 1 - Symptomatic but completely ambulatory  Vitals:   06/16/20 1237  BP: (!) 159/67  Pulse: 77  Resp: 18  Temp: 98 F (36.7 C)  SpO2: 98%   Filed Weights   06/16/20 1237  Weight: 123 lb 3.2 oz (55.9 kg)    GENERAL:alert, no distress and comfortable NEURO: alert & oriented x 3 with fluent speech, no focal motor/sensory deficits  LABORATORY DATA:  I have reviewed the data as listed    Component Value Date/Time   NA 142 06/16/2020 1225   NA 140 05/09/2017 0844   K 3.5 06/16/2020 1225   K 4.1 05/09/2017 0844   CL 105 06/16/2020 1225   CO2 27 06/16/2020 1225  CO2 26 05/09/2017 0844   GLUCOSE 91 06/16/2020 1225   GLUCOSE 82 05/09/2017 0844   BUN 32 (H) 06/16/2020 1225   BUN 20.7 05/09/2017 0844   CREATININE 0.91 06/16/2020 1225   CREATININE 0.7 05/09/2017 0844   CALCIUM 9.6 06/16/2020 1225   CALCIUM 9.3 05/09/2017 0844   PROT 7.0 06/16/2020 1225   PROT 6.8 05/09/2017 0844   ALBUMIN 3.6 06/16/2020 1225   ALBUMIN 3.6 05/09/2017 0844   AST 31 06/16/2020 1225   AST 27 05/09/2017 0844   ALT 22 06/16/2020 1225   ALT 17 05/09/2017 0844   ALKPHOS 59 06/16/2020 1225   ALKPHOS 43 05/09/2017 0844   BILITOT 0.6 06/16/2020 1225   BILITOT 0.81 05/09/2017 0844   GFRNONAA >60 06/16/2020 1225   GFRAA >60 02/07/2020 1109    No results found for: SPEP, UPEP  Lab Results  Component Value Date   WBC 6.5 06/16/2020   NEUTROABS 4.1 06/16/2020   HGB 11.5 (L) 06/16/2020   HCT 35.9 (L) 06/16/2020   MCV 96.5 06/16/2020   PLT 185 06/16/2020      Chemistry      Component Value Date/Time   NA 142 06/16/2020 1225   NA 140 05/09/2017 0844   K 3.5 06/16/2020 1225   K 4.1 05/09/2017 0844   CL 105 06/16/2020 1225   CO2 27 06/16/2020 1225   CO2 26 05/09/2017 0844   BUN 32 (H) 06/16/2020 1225   BUN 20.7 05/09/2017 0844   CREATININE 0.91 06/16/2020 1225   CREATININE 0.7 05/09/2017 0844      Component Value Date/Time   CALCIUM  9.6 06/16/2020 1225   CALCIUM 9.3 05/09/2017 0844   ALKPHOS 59 06/16/2020 1225   ALKPHOS 43 05/09/2017 0844   AST 31 06/16/2020 1225   AST 27 05/09/2017 0844   ALT 22 06/16/2020 1225   ALT 17 05/09/2017 0844   BILITOT 0.6 06/16/2020 1225   BILITOT 0.81 05/09/2017 0844       RADIOGRAPHIC STUDIES: I have personally reviewed the radiological images as listed and agreed with the findings in the report. VAS [place] LOWER EXTREMITY VENOUS (DVT)  Result Date: 06/14/2020  Lower Venous DVT Study Indications: Swelling.  Risk Factors: History of right lower extremity deep vein thrombosis. Comparison Study: 10/20/2019: Rt- Acute DVT of CFV, EIV, FV, popliteal vein,                   PTV, and peroneal veins. Performing Technologist: Ivan Croft  Examination Guidelines: A complete evaluation includes B-mode imaging, spectral Doppler, color Doppler, and power Doppler as needed of all accessible portions of each vessel. Bilateral testing is considered an integral part of a complete examination. Limited examinations for reoccurring indications may be performed as noted. The reflux portion of the exam is performed with the patient in reverse Trendelenburg.  +---------+---------------+---------+-----------+----------+-----------------+ RIGHT    CompressibilityPhasicitySpontaneityPropertiesThrombus Aging    +---------+---------------+---------+-----------+----------+-----------------+ CFV      None           No       No                   Age Indeterminate +---------+---------------+---------+-----------+----------+-----------------+ SFJ      Full                                                           +---------+---------------+---------+-----------+----------+-----------------+  FV Prox  None                                         Age Indeterminate +---------+---------------+---------+-----------+----------+-----------------+ FV Mid   None           No       No                   Age  Indeterminate +---------+---------------+---------+-----------+----------+-----------------+ FV DistalNone                                         Age Indeterminate +---------+---------------+---------+-----------+----------+-----------------+ POP      None           No       No                   Age Indeterminate +---------+---------------+---------+-----------+----------+-----------------+ PTV      Partial                                      Age Indeterminate +---------+---------------+---------+-----------+----------+-----------------+ PERO     Partial                                      Age Indeterminate +---------+---------------+---------+-----------+----------+-----------------+ GSV      Full                                                           +---------+---------------+---------+-----------+----------+-----------------+ SSV      Full                                                           +---------+---------------+---------+-----------+----------+-----------------+   +---------+---------------+---------+-----------+----------+--------------+ LEFT     CompressibilityPhasicitySpontaneityPropertiesThrombus Aging +---------+---------------+---------+-----------+----------+--------------+ CFV      Full           Yes      Yes                                 +---------+---------------+---------+-----------+----------+--------------+ SFJ      Full                                                        +---------+---------------+---------+-----------+----------+--------------+ FV Prox  Full                                                        +---------+---------------+---------+-----------+----------+--------------+ FV  Mid   Full           Yes      Yes                                 +---------+---------------+---------+-----------+----------+--------------+ FV DistalFull                                                         +---------+---------------+---------+-----------+----------+--------------+ PFV      Full                                                        +---------+---------------+---------+-----------+----------+--------------+ POP      Full           Yes      Yes                                 +---------+---------------+---------+-----------+----------+--------------+ PTV      Full                                                        +---------+---------------+---------+-----------+----------+--------------+ PERO     Full                                                        +---------+---------------+---------+-----------+----------+--------------+ GSV      Full                                                        +---------+---------------+---------+-----------+----------+--------------+ SSV      Full                                                        +---------+---------------+---------+-----------+----------+--------------+     Summary:  RIGHT: - Findings consistent with age indeterminate deep vein thrombosis involving the right external iliac vein, common femoral vein, right femoral vein, right popliteal vein, right posterior tibial veins, and right peroneal veins. -Limited visualization of the abdominal veins due to bowel gas, however, IVC and common iliac veins appear patent. - There is no evidence of superficial venous thrombosis. - No cystic structure found in the popliteal fossa. - Results were called to [facility] at [facility] [employer] [employer] office (06/14/2020)  LEFT: - There is no evidence of deep vein thrombosis in the lower extremity. - There is no evidence of superficial venous thrombosis.  - No cystic structure found  in the popliteal fossa.  *See table(s) above for measurements and observations. Electronically signed by Harold Barban MD on 06/14/2020 at 38:17:53 PM.    Final

## 2020-06-28 ENCOUNTER — Other Ambulatory Visit: Payer: Self-pay | Admitting: Hematology and Oncology

## 2020-07-01 ENCOUNTER — Other Ambulatory Visit: Payer: Self-pay | Admitting: Neurology

## 2020-07-03 LAB — TOTAL PROTEIN, URINE DIPSTICK: Protein, ur: 30 mg/dL — AB

## 2020-07-06 ENCOUNTER — Telehealth: Payer: Self-pay

## 2020-07-06 NOTE — Telephone Encounter (Signed)
She called and left a message to call her. She needs to move PET scan appt expected on 4/1.   Called back and given radiology scheduling #, ask her to call the office back once she schedules PET. She verbalized understanding.

## 2020-07-06 NOTE — Telephone Encounter (Signed)
Called and given rescheduled appts around PET scan. She verbalized understanding.

## 2020-07-24 IMAGING — CT CT CHEST WITH CONTRAST
2 of 5 series · 12 of 36 positions shown, 15 images · IV contrast (APPLIED)
Comparison: 04/29/2018 CT abdomen/pelvis. 04/08/2016 CT chest,
abdomen and pelvis.

CLINICAL DATA: History of endometrial cancer with multiple
recurrences. Restaging.

EXAM:
CT CHEST, ABDOMEN, AND PELVIS WITH CONTRAST
TECHNIQUE: Multidetector CT imaging of the chest, abdomen and pelvis was
performed following the standard protocol during bolus
administration of intravenous contrast.
CONTRAST:  100mL OMNIPAQUE IOHEXOL 300 MG/ML  SOLN

[Series 2: cap with · axial · 0.76mm/px · z∈[-727,-247]mm · 9 of 122 slices shown, 12 images]
[im 13/122  mediastinal]
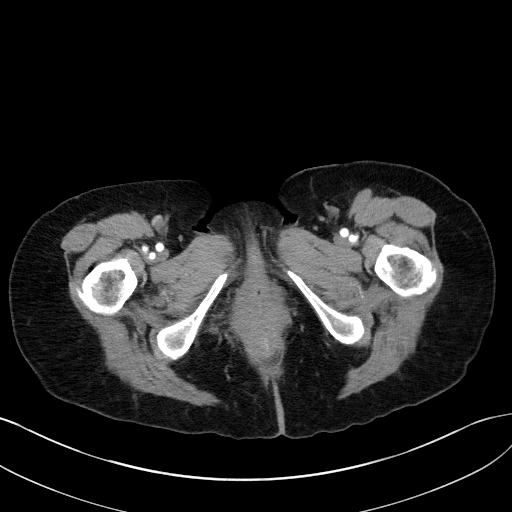
[im 13/122  lung]
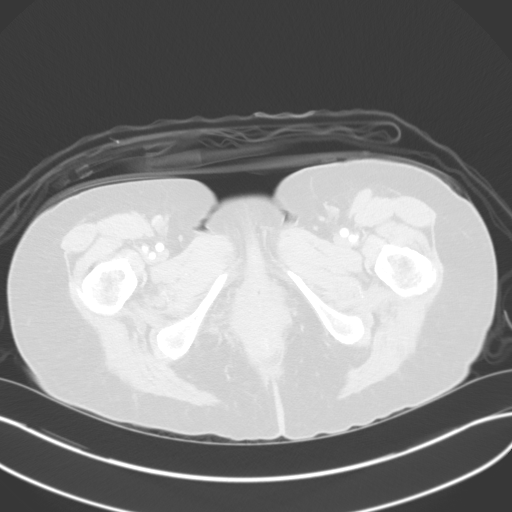
[im 25/122  lung]
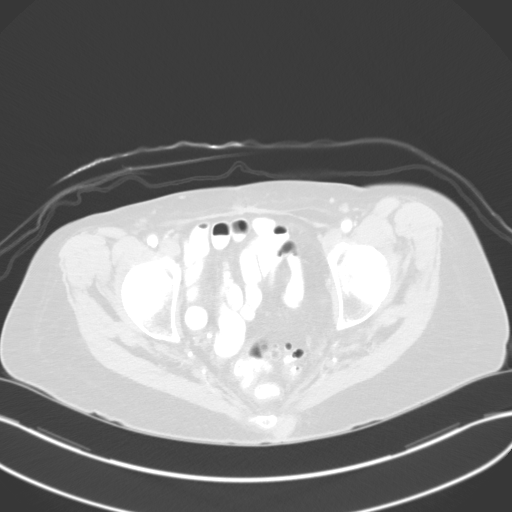
[im 37/122  lung]
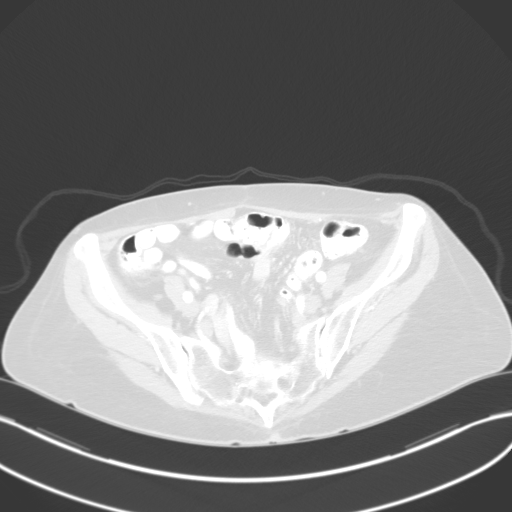
[im 49/122  lung]
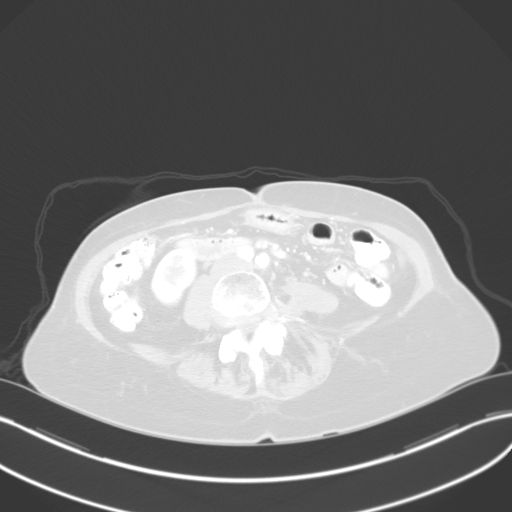
[im 61/122  mediastinal]
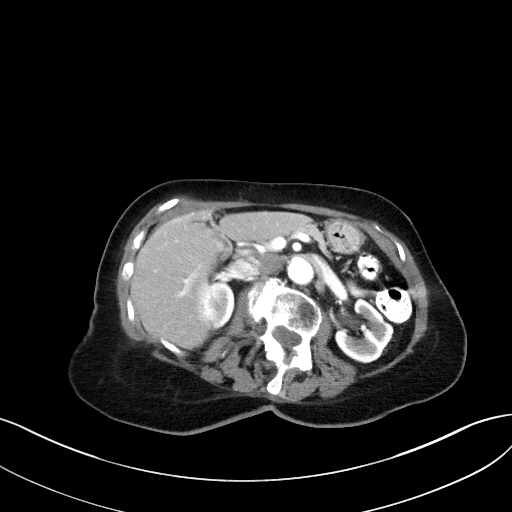
[im 61/122  lung]
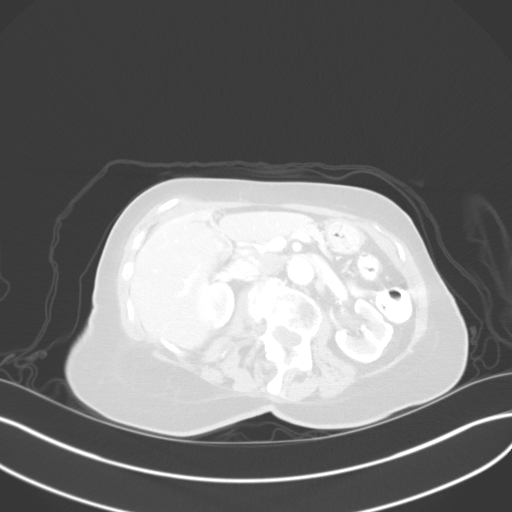
[im 73/122  lung]
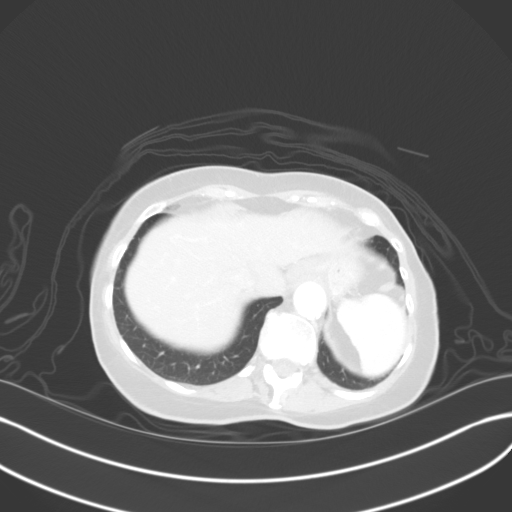
[im 85/122  lung]
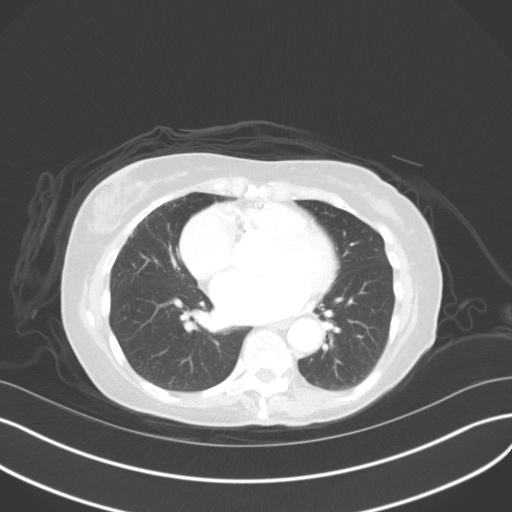
[im 97/122  lung]
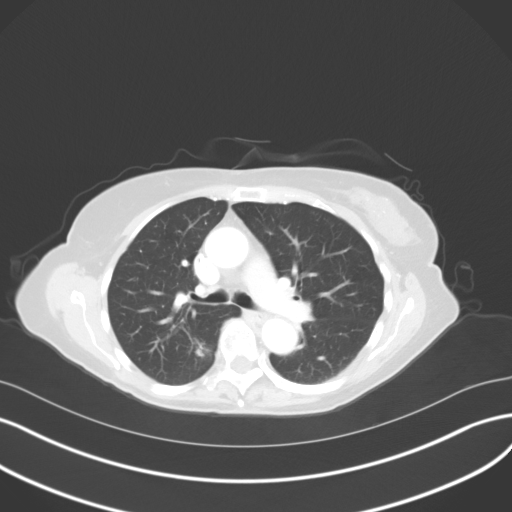
[im 109/122  mediastinal]
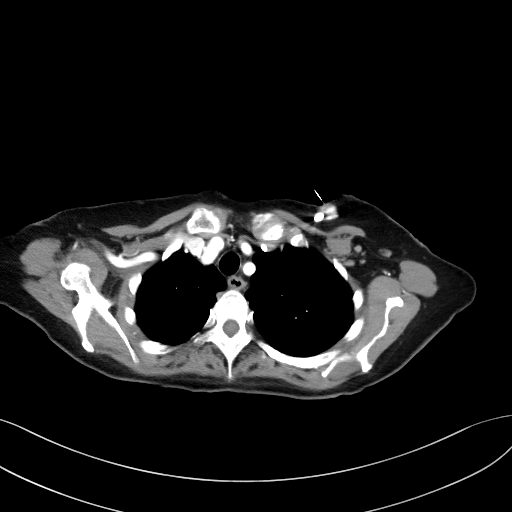
[im 109/122  lung]
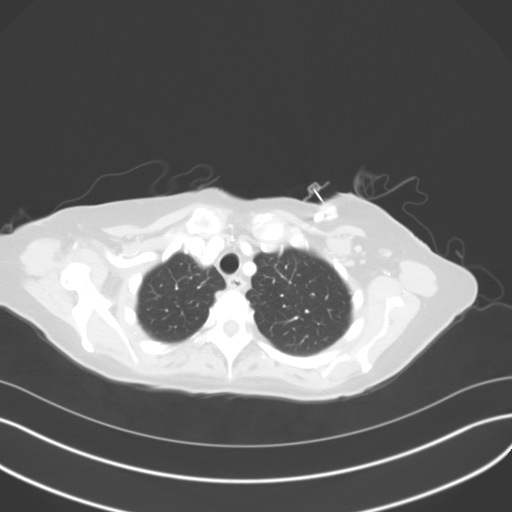

[Series 4: coronals · coronal · 0.64mm/px · 3 of 107 slices shown]
[im 22/107  lung]
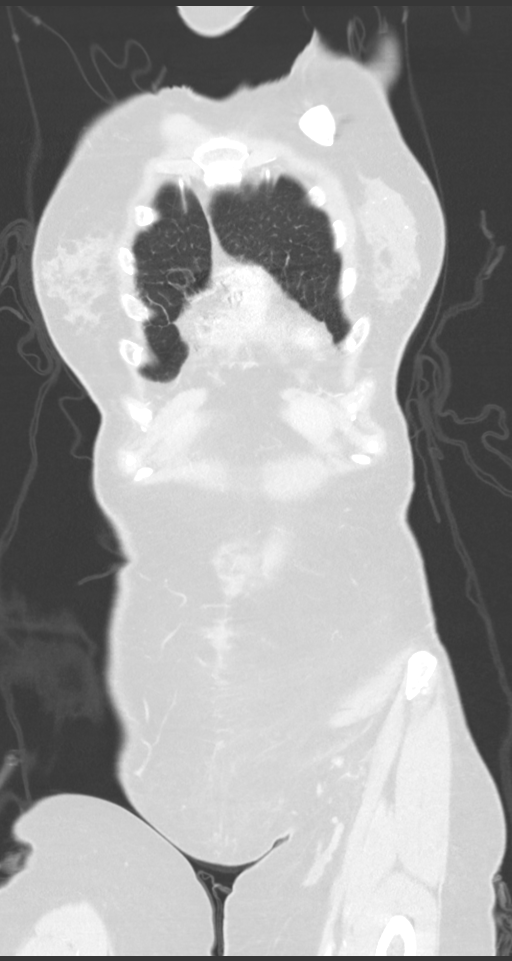
[im 43/107  lung]
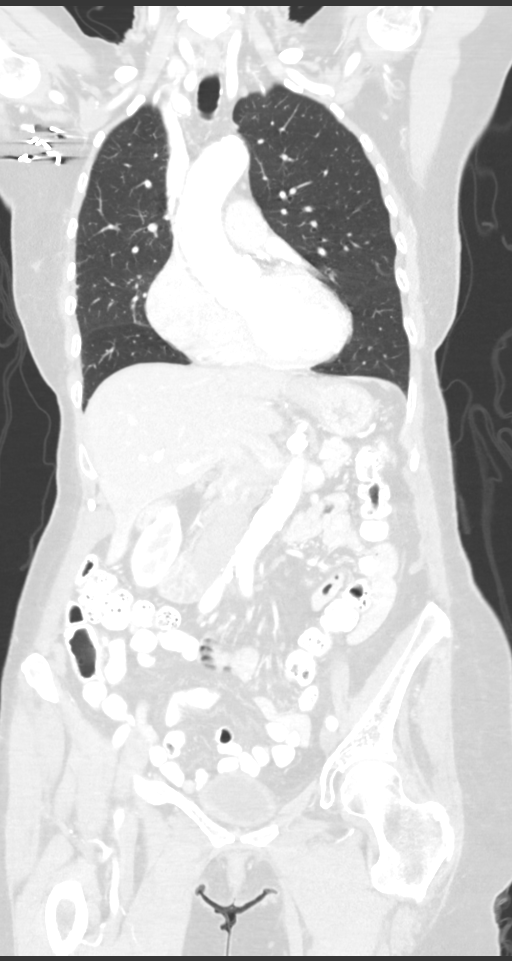
[im 64/107  lung]
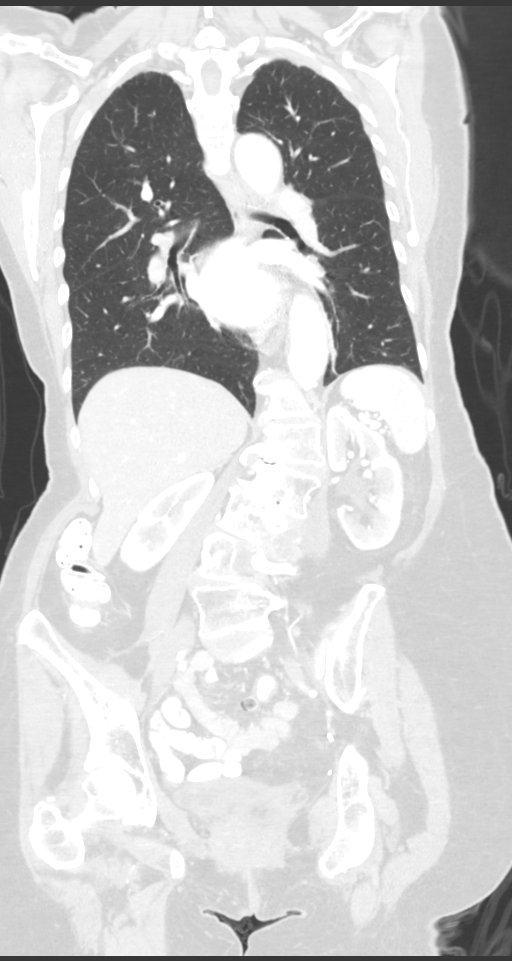

[12 of 36 positions shown; findings below may reference images not displayed]

FINDINGS: CT CHEST FINDINGS

Cardiovascular: Top-normal heart size. No significant pericardial
effusion/thickening. Left anterior descending and right coronary
atherosclerosis. Left internal jugular Port-A-Cath terminates in the
lower third of the SVC. Atherosclerotic nonaneurysmal thoracic
aorta. Normal caliber pulmonary arteries. No central pulmonary
emboli.

Mediastinum/Nodes: Stable multinodular goiter with subcentimeter
hypodense bilateral thyroid nodules, some of which are partially
calcified on the right. Unremarkable esophagus. Surgical clips are
again noted in the right axilla. No pathologically enlarged
axillary, mediastinal or hilar lymph nodes.

Lungs/Pleura: No pneumothorax. No pleural effusion. Irregular
subsolid 2.1 x 1.5 cm superior segment right lower lobe pulmonary
nodule is increased in size and density, previously 1.0 x 0.6 cm on
04/08/2016 chest CT and 0.8 x 0.5 cm on 12/01/2007 chest CT, and now
crosses the major fissure into posterior right upper lobe (series
6/image 64). Stable mild sharply marginated patchy subpleural
reticulation in the anterior mid to upper right lung compatible with
minimal radiation fibrosis. No acute consolidative airspace disease
or additional significant pulmonary nodules.

Musculoskeletal: No aggressive appearing focal osseous lesions. Mild
thoracic spondylosis.

CT ABDOMEN PELVIS FINDINGS

Hepatobiliary: Normal liver with no liver mass. Cholelithiasis. No
biliary ductal dilatation.

Pancreas: Normal, with no mass or duct dilation.

Spleen: Normal size. No mass.

Adrenals/Urinary Tract: Normal adrenals. Normal kidneys with no
hydronephrosis and no renal mass. Chronic mild diffuse bladder wall
thickening, not definitely changed.

Stomach/Bowel: Normal non-distended stomach. Normal caliber small
bowel with no small bowel wall thickening. Appendix not discretely
visualized. Moderate sigmoid diverticulosis, with no definite large
bowel wall thickening or significant pericolonic fat stranding. Oral
contrast transits to the rectum.

Vascular/Lymphatic: Atherosclerotic nonaneurysmal abdominal aorta.
Patent portal, splenic, hepatic and renal veins. No pathologically
enlarged lymph nodes in the abdomen or pelvis.

Reproductive: Status post hysterectomy, with no abnormal findings at
the vaginal cuff. No adnexal mass.

Other: No pneumoperitoneum, ascites or focal fluid collection.

Musculoskeletal: No aggressive appearing focal osseous lesions. Left
L3-4 paraspinous 2.4 x 1.7 cm mass with irregular peripheral
enhancement (series 2/image 70), previously 2.4 x 1.7 cm on
04/29/2018 CT using similar measurement technique, stable. Marked
lumbar spondylosis.
IMPRESSION: 1. Subsolid 2.1 cm irregular superior segment right lower lobe
pulmonary nodule is increased in size and density since 1890 chest
CT, and now crosses the major fissure into the right upper lobe,
most compatible with enlarging primary bronchogenic adenocarcinoma
given progressive growth since 1226 CT studies. Multidisciplinary
thoracic oncology consultation suggested.
2. Stable enhancing left L3-4 paraspinous metastasis. No new or
progressive metastatic disease. No evidence of peritoneal
recurrence.
3. Chronic mild diffuse bladder wall thickening, unchanged, probably
treatment related.
4. Aortic Atherosclerosis (SIHXR-GO8.8). Additional chronic findings
as detailed.

## 2020-08-11 ENCOUNTER — Other Ambulatory Visit: Payer: Medicare Other

## 2020-08-14 ENCOUNTER — Ambulatory Visit: Payer: Medicare Other | Admitting: Hematology and Oncology

## 2020-08-14 ENCOUNTER — Other Ambulatory Visit: Payer: Medicare Other

## 2020-08-15 DIAGNOSIS — I2510 Atherosclerotic heart disease of native coronary artery without angina pectoris: Secondary | ICD-10-CM | POA: Diagnosis not present

## 2020-08-15 DIAGNOSIS — C55 Malignant neoplasm of uterus, part unspecified: Secondary | ICD-10-CM | POA: Diagnosis not present

## 2020-08-15 DIAGNOSIS — R413 Other amnesia: Secondary | ICD-10-CM | POA: Diagnosis not present

## 2020-08-15 DIAGNOSIS — N1831 Chronic kidney disease, stage 3a: Secondary | ICD-10-CM | POA: Diagnosis not present

## 2020-08-15 DIAGNOSIS — I82401 Acute embolism and thrombosis of unspecified deep veins of right lower extremity: Secondary | ICD-10-CM | POA: Diagnosis not present

## 2020-08-15 DIAGNOSIS — Z7901 Long term (current) use of anticoagulants: Secondary | ICD-10-CM | POA: Diagnosis not present

## 2020-08-15 DIAGNOSIS — Z853 Personal history of malignant neoplasm of breast: Secondary | ICD-10-CM | POA: Diagnosis not present

## 2020-08-15 DIAGNOSIS — R600 Localized edema: Secondary | ICD-10-CM | POA: Diagnosis not present

## 2020-08-15 DIAGNOSIS — I129 Hypertensive chronic kidney disease with stage 1 through stage 4 chronic kidney disease, or unspecified chronic kidney disease: Secondary | ICD-10-CM | POA: Diagnosis not present

## 2020-08-15 DIAGNOSIS — C7951 Secondary malignant neoplasm of bone: Secondary | ICD-10-CM | POA: Diagnosis not present

## 2020-08-16 ENCOUNTER — Inpatient Hospital Stay: Payer: Medicare Other

## 2020-08-16 ENCOUNTER — Ambulatory Visit (HOSPITAL_COMMUNITY)
Admission: RE | Admit: 2020-08-16 | Discharge: 2020-08-16 | Disposition: A | Discharge status: Home / Self Care | Payer: Medicare Other | Source: Ambulatory Visit | Attending: Hematology and Oncology | Admitting: Hematology and Oncology

## 2020-08-16 ENCOUNTER — Other Ambulatory Visit: Payer: Self-pay

## 2020-08-16 ENCOUNTER — Inpatient Hospital Stay: Payer: Medicare Other | Attending: Hematology and Oncology

## 2020-08-16 DIAGNOSIS — C3491 Malignant neoplasm of unspecified part of right bronchus or lung: Secondary | ICD-10-CM | POA: Insufficient documentation

## 2020-08-16 DIAGNOSIS — C549 Malignant neoplasm of corpus uteri, unspecified: Secondary | ICD-10-CM

## 2020-08-16 DIAGNOSIS — Z7189 Other specified counseling: Secondary | ICD-10-CM

## 2020-08-16 DIAGNOSIS — I700 Atherosclerosis of aorta: Secondary | ICD-10-CM | POA: Diagnosis not present

## 2020-08-16 DIAGNOSIS — Z79899 Other long term (current) drug therapy: Secondary | ICD-10-CM | POA: Insufficient documentation

## 2020-08-16 DIAGNOSIS — C7951 Secondary malignant neoplasm of bone: Secondary | ICD-10-CM | POA: Insufficient documentation

## 2020-08-16 DIAGNOSIS — Z853 Personal history of malignant neoplasm of breast: Secondary | ICD-10-CM

## 2020-08-16 DIAGNOSIS — D701 Agranulocytosis secondary to cancer chemotherapy: Secondary | ICD-10-CM

## 2020-08-16 DIAGNOSIS — K8020 Calculus of gallbladder without cholecystitis without obstruction: Secondary | ICD-10-CM | POA: Insufficient documentation

## 2020-08-16 DIAGNOSIS — Z171 Estrogen receptor negative status [ER-]: Secondary | ICD-10-CM | POA: Insufficient documentation

## 2020-08-16 DIAGNOSIS — C3490 Malignant neoplasm of unspecified part of unspecified bronchus or lung: Secondary | ICD-10-CM | POA: Diagnosis not present

## 2020-08-16 DIAGNOSIS — Z885 Allergy status to narcotic agent status: Secondary | ICD-10-CM | POA: Insufficient documentation

## 2020-08-16 DIAGNOSIS — C3431 Malignant neoplasm of lower lobe, right bronchus or lung: Secondary | ICD-10-CM | POA: Insufficient documentation

## 2020-08-16 DIAGNOSIS — I2510 Atherosclerotic heart disease of native coronary artery without angina pectoris: Secondary | ICD-10-CM | POA: Insufficient documentation

## 2020-08-16 DIAGNOSIS — C541 Malignant neoplasm of endometrium: Secondary | ICD-10-CM | POA: Diagnosis not present

## 2020-08-16 DIAGNOSIS — T451X5A Adverse effect of antineoplastic and immunosuppressive drugs, initial encounter: Secondary | ICD-10-CM

## 2020-08-16 DIAGNOSIS — Z95828 Presence of other vascular implants and grafts: Secondary | ICD-10-CM

## 2020-08-16 DIAGNOSIS — Z7901 Long term (current) use of anticoagulants: Secondary | ICD-10-CM | POA: Insufficient documentation

## 2020-08-16 DIAGNOSIS — I10 Essential (primary) hypertension: Secondary | ICD-10-CM | POA: Insufficient documentation

## 2020-08-16 DIAGNOSIS — Z5112 Encounter for antineoplastic immunotherapy: Secondary | ICD-10-CM | POA: Insufficient documentation

## 2020-08-16 DIAGNOSIS — R21 Rash and other nonspecific skin eruption: Secondary | ICD-10-CM | POA: Insufficient documentation

## 2020-08-16 LAB — CMP (CANCER CENTER ONLY)
ALT: 19 U/L (ref 0–44)
AST: 25 U/L (ref 15–41)
Albumin: 3.4 g/dL — ABNORMAL LOW (ref 3.5–5.0)
Alkaline Phosphatase: 52 U/L (ref 38–126)
Anion gap: 11 (ref 5–15)
BUN: 25 mg/dL — ABNORMAL HIGH (ref 8–23)
CO2: 23 mmol/L (ref 22–32)
Calcium: 8.8 mg/dL — ABNORMAL LOW (ref 8.9–10.3)
Chloride: 111 mmol/L (ref 98–111)
Creatinine: 0.88 mg/dL (ref 0.44–1.00)
GFR, Estimated: >60 mL/min (ref >60)
Glucose, Bld: 91 mg/dL (ref 70–99)
Potassium: 4 mmol/L (ref 3.5–5.1)
Sodium: 145 mmol/L (ref 135–145)
Total Bilirubin: 0.6 mg/dL (ref 0.3–1.2)
Total Protein: 6.7 g/dL (ref 6.5–8.1)

## 2020-08-16 LAB — CBC WITH DIFFERENTIAL (CANCER CENTER ONLY)
Abs Immature Granulocytes: 0.04 10*3/uL (ref 0.00–0.07)
Basophils Absolute: 0 10*3/uL (ref 0.0–0.1)
Basophils Relative: 1 %
Eosinophils Absolute: 0.2 10*3/uL (ref 0.0–0.5)
Eosinophils Relative: 3 %
HCT: 35.3 % — ABNORMAL LOW (ref 36.0–46.0)
Hemoglobin: 11.3 g/dL — ABNORMAL LOW (ref 12.0–15.0)
Immature Granulocytes: 1 %
Lymphocytes Relative: 13 %
Lymphs Abs: 0.8 10*3/uL (ref 0.7–4.0)
MCH: 31 pg (ref 26.0–34.0)
MCHC: 32 g/dL (ref 30.0–36.0)
MCV: 97 fL (ref 80.0–100.0)
Monocytes Absolute: 0.8 10*3/uL (ref 0.1–1.0)
Monocytes Relative: 14 %
Neutro Abs: 4.1 10*3/uL (ref 1.7–7.7)
Neutrophils Relative %: 68 %
Platelet Count: 160 10*3/uL (ref 150–400)
RBC: 3.64 MIL/uL — ABNORMAL LOW (ref 3.87–5.11)
RDW: 14.5 % (ref 11.5–15.5)
WBC Count: 6 10*3/uL (ref 4.0–10.5)
nRBC: 0 % (ref 0.0–0.2)

## 2020-08-16 LAB — TSH: TSH: 2.212 u[IU]/mL (ref 0.308–3.960)

## 2020-08-16 LAB — GLUCOSE, CAPILLARY: Glucose-Capillary: 81 mg/dL (ref 70–99)

## 2020-08-16 MED ORDER — FLUDEOXYGLUCOSE F - 18 (FDG) INJECTION
6.1000 | Freq: Once | INTRAVENOUS | Status: AC | PRN
  Administered 2020-08-16: 6.1 via INTRAVENOUS

## 2020-08-16 MED ORDER — SODIUM CHLORIDE 0.9% FLUSH
10.0000 mL | Freq: Once | INTRAVENOUS | Status: AC
  Administered 2020-08-16: 10 mL
  Filled 2020-08-16: qty 10

## 2020-08-17 ENCOUNTER — Other Ambulatory Visit: Payer: Self-pay | Admitting: Hematology and Oncology

## 2020-08-17 ENCOUNTER — Other Ambulatory Visit: Payer: Self-pay

## 2020-08-17 ENCOUNTER — Other Ambulatory Visit (HOSPITAL_COMMUNITY): Payer: Self-pay

## 2020-08-17 ENCOUNTER — Telehealth: Payer: Self-pay | Admitting: Pharmacist

## 2020-08-17 ENCOUNTER — Encounter: Payer: Self-pay | Admitting: Hematology and Oncology

## 2020-08-17 ENCOUNTER — Inpatient Hospital Stay (HOSPITAL_BASED_OUTPATIENT_CLINIC_OR_DEPARTMENT_OTHER): Payer: Medicare Other | Admitting: Hematology and Oncology

## 2020-08-17 ENCOUNTER — Telehealth: Payer: Self-pay | Admitting: Hematology and Oncology

## 2020-08-17 VITALS — BP 169/64 | HR 81 | Temp 97.7°F | Resp 18 | Ht 59.0 in | Wt 128.4 lb

## 2020-08-17 DIAGNOSIS — C541 Malignant neoplasm of endometrium: Secondary | ICD-10-CM | POA: Diagnosis not present

## 2020-08-17 DIAGNOSIS — C3431 Malignant neoplasm of lower lobe, right bronchus or lung: Secondary | ICD-10-CM | POA: Diagnosis not present

## 2020-08-17 DIAGNOSIS — K8020 Calculus of gallbladder without cholecystitis without obstruction: Secondary | ICD-10-CM | POA: Diagnosis not present

## 2020-08-17 DIAGNOSIS — Z79899 Other long term (current) drug therapy: Secondary | ICD-10-CM | POA: Diagnosis not present

## 2020-08-17 DIAGNOSIS — Z7189 Other specified counseling: Secondary | ICD-10-CM | POA: Diagnosis not present

## 2020-08-17 DIAGNOSIS — C7951 Secondary malignant neoplasm of bone: Secondary | ICD-10-CM | POA: Diagnosis not present

## 2020-08-17 DIAGNOSIS — Z885 Allergy status to narcotic agent status: Secondary | ICD-10-CM | POA: Diagnosis not present

## 2020-08-17 DIAGNOSIS — I10 Essential (primary) hypertension: Secondary | ICD-10-CM

## 2020-08-17 DIAGNOSIS — Z7901 Long term (current) use of anticoagulants: Secondary | ICD-10-CM | POA: Diagnosis not present

## 2020-08-17 DIAGNOSIS — Z171 Estrogen receptor negative status [ER-]: Secondary | ICD-10-CM | POA: Diagnosis not present

## 2020-08-17 DIAGNOSIS — Z5112 Encounter for antineoplastic immunotherapy: Secondary | ICD-10-CM | POA: Diagnosis not present

## 2020-08-17 DIAGNOSIS — I700 Atherosclerosis of aorta: Secondary | ICD-10-CM | POA: Diagnosis not present

## 2020-08-17 DIAGNOSIS — R21 Rash and other nonspecific skin eruption: Secondary | ICD-10-CM | POA: Diagnosis not present

## 2020-08-17 DIAGNOSIS — I2510 Atherosclerotic heart disease of native coronary artery without angina pectoris: Secondary | ICD-10-CM | POA: Diagnosis not present

## 2020-08-17 MED ORDER — LENVATINIB (10 MG DAILY DOSE) 10 MG PO CPPK
10.0000 mg | ORAL_CAPSULE | Freq: Every day | ORAL | 11 refills | Status: DC
  Filled 2020-08-17: qty 30, 30d supply, fill #0

## 2020-08-17 MED ORDER — LIDOCAINE-PRILOCAINE 2.5-2.5 % EX CREA: TOPICAL_CREAM | CUTANEOUS | 3 refills | Status: DC

## 2020-08-17 MED ORDER — ONDANSETRON HCL 8 MG PO TABS: 8.0000 mg | ORAL_TABLET | Freq: Two times a day (BID) | ORAL | 1 refills | Status: DC | PRN

## 2020-08-17 MED ORDER — LENVATINIB (20 MG DAILY DOSE) 2 X 10 MG PO CPPK
10.0000 mg | ORAL_CAPSULE | Freq: Every day | ORAL | 11 refills | Status: DC
  Filled 2020-08-17: qty 30, 30d supply, fill #0

## 2020-08-17 MED ORDER — PROCHLORPERAZINE MALEATE 10 MG PO TABS: 10.0000 mg | ORAL_TABLET | Freq: Four times a day (QID) | ORAL | 1 refills | Status: DC | PRN

## 2020-08-17 NOTE — Progress Notes (Signed)
OFF PATHWAY REGIMEN - Uterine  No Change  Continue With Treatment as Ordered.  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

## 2020-08-17 NOTE — Telephone Encounter (Signed)
Scheduled appts per 4/7 sch msg. Pt aware.

## 2020-08-17 NOTE — Progress Notes (Signed)
[facility] OFFICE PROGRESS NOTE  Patient Care Team: Shon Baton, MD as PCP - General (Internal Medicine) Heath Lark, MD as Consulting Physician (Hematology and Oncology)  ASSESSMENT & PLAN:  Endometrial cancer [facility]) I have reviewed multiple PET CT imaging with the patient and her husband Unfortunately, she has cancer relapse in regional lymph node and the bony metastasis in her lumbar spine Thankfully, she is not symptomatic Previously, she had excellent response to treatment with pembrolizumab plus lenvatinib Her treatment was discontinued due to difficulties controlling side effects Moving forward, all her side effects is resolved I have discussed the case with her primary care doctor and both of [place] will be monitoring her very closely for anticipated side effects in the future, including her previous issues with uncontrolled hypertension I will start upfront dose adjustment of lenvatinib at 10 mg daily We will try to get her scheduled for treatment starting April 18   Metastatic cancer to bone [facility]) The patient has no dental issues She had received Zometa in the past Given new recurrence in her lumbar spine, I recommend resumption of Zometa every 3 months I recommend calcium with vitamin D supplement now and we will start her on Zometa on April 18  Essential hypertension She has history of uncontrolled hypertension Her blood pressure is mildly elevated today but could be attributed to anxiety According to her documented blood pressure from home, they were within normal range I recommend she continue to check her blood pressure twice a day and bring her documented blood pressure from home in her next visit   Orders Placed This Encounter  Procedures  . CBC with Differential (Cancer Center Only)    Standing Status:   Standing    Number of Occurrences:   20    Standing Expiration Date:   08/17/2021  . CMP ([facility] only)    Standing Status:   Standing     Number of Occurrences:   20    Standing Expiration Date:   08/17/2021  . T4    Standing Status:   Standing    Number of Occurrences:   20    Standing Expiration Date:   08/17/2021  . TSH    Standing Status:   Standing    Number of Occurrences:   20    Standing Expiration Date:   08/17/2021  . Total Protein, Urine dipstick    Standing Status:   Standing    Number of Occurrences:   20    Standing Expiration Date:   08/17/2021    All questions were answered. The patient knows to call the clinic with any problems, questions or concerns. The total time spent in the appointment was 40 minutes encounter with patients including review of chart and various tests results, discussions about plan of care and coordination of care plan   Heath Lark, MD 08/17/2020 10:47 AM  INTERVAL HISTORY: Please see below for problem oriented charting. She returns for treatment and follow-up She is doing well She has gained some weight Her blood pressure is under excellent control She has no ulcer on her legs She has minimal trace edema She denies back pain  SUMMARY OF ONCOLOGIC HISTORY: Oncology History Overview Note  Biopsy of recurrence 06-2014 ER negative. PDL1 testing low at 2% on the uterine cancer She progressed on carboplatin and Paclitaxel for uterine cancer HER 2 negative on pathology [id number] Negative genetic testing  On her lung cancer sample, [employer] testing revealed 10% PD-1 positive EGFR  mutation was detected, exon 19 deletion   Endometrial cancer ([place])  12/01/2007 Initial Diagnosis   She has recurrent papillary serous endometrial carcinoma. Briefly she was diagnosed in 2009 with a FIGO IB pappilary serous carcinoma treated with hysterectomy followed by adjuvant chemo and vaginal brachytherapy. She then had a recurrence diagnosed in late 2012 that was deemed not to be surgically resectable, and was treated with 6 cycles of carbo/taxol followed by 5040 cGy of EBRT to the pelvic mass. She  was then followed with serial imaging and was noted in June of this year to have slight increase in the size of the mass, and again in September there was small enlargement of the mass. She had a re-staging PET scan on 03/10/13 that showed FDG activity in the pelvic mass, with no other areas of disease   12/01/2007 Imaging   CT scan of chest, abdomen and pelvis: 1.  Mass like area of decreased attenuation in the central uterus, consistent with the known endometrial carcinoma.  Deep myometrial invasion is suspected.  Pelvic MRI without and with contrast could be performed for further staging workup if clinically warranted. 2.  No evidence of extrauterine extension or lymphadenopathy. 3.  Sigmoid diverticulosis incidentally noted.   12/15/2007 Surgery   --12/2007 laparoscopic hysterectomy and PLND --05/2008 complted 6 cycles adjuvant carbo/taxol followed by vaginal cuff brachy --03/2011 exploratory lararoscopy, ureterolysis --4/13 completed 6 cycels carbotaxol --8/13 completed EBRT to the left pelvic sidewall    06/10/2008 Imaging   CT scan abdomen and pelvis 1.  Nonspecific mildly prominent inguinal lymph nodes, right greater than left. 2.  Interval hysterectomy without evidence of recurrent pelvic mass or fluid collection.   03/22/2011 Imaging   Ct scan abdomen and pelvis 1.  Local uterine cancer recurrence with two large nodes along the left pelvic sidewall. 2.  No evidence of bowel obstruction, urinary obstruction, or more distant metastasis.   03/31/2011 Relapse/Recurrence   chemotherapy and external beam radiation   05/16/2011 - 09/17/2011 Chemotherapy   She had 6 cycles of carboplatin and Taxol    07/17/2011 Imaging   Ct scan abdomen and pelvis 1.  Interval improvement in the previously demonstrated left pelvic local recurrence of tumor. 2.  No disease progression or complication identified. 3.  Mild bladder wall thickening on the right, nonspecific and possibly related to incomplete  distension and/or radiation therapy. 4.  Cholelithiasis   10/11/2011 Imaging   CT scan abdomen and pelvis 1.  Interval enlargement of the left pelvic sidewall masses. 2.  No new nodular disease in the pelvis or lymphadenopathy. 3.  No evidence  of distant metastasis. 4.  Mild hydronephrosis on the right is similar to prior. 5.  Focal thickening of the right aspect the bladder is stable compared to prior.    10/28/2011 - 12/05/2011 Radiation Therapy   radiation for pelvic sidewall recurrence   10/28/2011 - 12/05/2011 Radiation Therapy   10/28/11-12/05/11: Radiotherapy to the left pelvic sidewall   03/18/2012 Imaging   CT abdomen 1.  Today's study demonstrates a positive response to therapy with decreased size of left pelvic sidewall nodal masses, as detailed above.  No new soft tissue masses or new lymphadenopathy is identified within the abdomen or pelvis. Continue attention on follow-up studies is recommended. 2.  Cholelithiasis without findings to suggest acute cholecystitis. 3.  Colonic diverticulosis without findings to suggest acute diverticulitis at this time. 4.  Mild asymmetric urinary bladder wall thickening is unchanged compared to prior examinations and is nonspecific.  No  definite bladder wall mass is identified at this time. 5.  Additional findings, similar to prior examinations, as above   06/22/2012 Imaging   CT abdomen 1.  Further decreased size of the left pelvic peripherally enhancing lesion. 2.  No new sites of new or progressive disease. 3. Esophageal air fluid level suggests dysmotility or gastroesophageal reflux. 4.  Cholelithiasis   11/02/2012 Imaging   CT abdomen 1.  The left pelvic side wall lesion demonstrates mild increase in size from previous exam. 2.  No new sites of new or progressive disease. 3.  Cholelithiasis.    01/28/2013 Imaging   CT abdomen 1. Interval small increase in volume of enhancing mass adjacent to the left pelvic sidewall. 2. No  evidence of abdominal or pelvic lymphadenopathy   04/02/2013 Surgery   pelvic mass resection with IORT   04/02/2013 - 04/02/2013 Radiation Therapy   04/02/13: Intraoperative radiotherapy to the left pelvis   05/10/2013 PET scan   1. Hypermetabolic mass in the deep left pelvis consistent with metastasis. 2. No additional evidence of local metastasis. No evidence of distant metastasis. 3. Small right lower lobe pulmonary nodule is not hypermetabolic.   07/27/2013 Imaging   No evidence of recurrent or metastatic disease. Apparent prior resection of the deep left pelvic metastasis.   02/08/2014 Imaging   No CT findings for recurrent or metastatic disease involving the abdomen/ pelvis   06/13/2014 Relapse/Recurrence   + recurrence paraspinous muscle   06/21/2014 Procedure   There is a soft tissue lesion along the left side of L3-L4. This lesion roughly measures up to 2.2 cm. Needle was positioned along the posterior aspect of the lesion. Small amount of air in the paraspinal tissues following needle removal   06/21/2014 Pathology Results   Bone, biopsy, left lumbar paraspinal - METASTATIC POORLY DIFFERENTIATED CARCINOMA, CONSISTENT WITH HIGH GRADE SEROUS CARCINOMA SEE COMMENT. Microscopic Comment The carcinoma demonstrates the following immunophenotype: Cytokeratin 7 - patchy moderate to strong expression Estrogen receptor - negative expression P53 - strong diffuse expression TTF-1 - negative expression WT-1 - negative expression CD56 - focal moderate strong expression Synaptophysin - negative expression GCDFP - negative expression The history of primary endometrial papillary serous carcinoma and primary mammary carcinoma is noted. In the current case, the overall morphologic and immunophenotype are that of poorly differentiated carcinoma, consistent with high grade serous carcinoma.    07/11/2014 - 08/12/2014 Radiation Therapy   07/11/14-08/12/14: 55 Gy in 25 fractions to the Lumbar spine    08/15/2014 Imaging   CT scan of chest, abdomen and pelvis 1. Since the biopsy study of 06/21/2014, similar to slight decrease in size of a left paraspinous lesion at L3-4. 2. No new sites of metastatic disease identified. 3. 8 mm ground-glass nodule in the right lower lobe is grossly similar to 03/10/2013, suggesting a benign etiology. 4. Cholelithiasis. 5. Apparent sigmoid colonic wall thickening which could be due to underdistention. Colitis felt less likely. 6.  Atherosclerosis, including within the coronary arteries. 7. Pelvic floor laxity.   10/07/2014 Imaging   CT scan of chest, abdomen and pelvis: Stable to slight interval decrease in size of left paraspinous lesion at L3-4. No new sites of metastatic disease. Unchanged ground-glass nodule within the right lower lobe, potentially benign in etiology. Recommend attention on followup. Cholelithiasis. Sigmoid colonic diverticulosis. No CT evidence to suggest acute diverticulitis.   01/19/2015 Imaging   Ct scan of abdomen and pelvis 1. Decrease in size of left paraspinous metastasis. 2. No new sites of disease.  3. Stable ground-glass attenuating nodule within the superior segment of right lower lobe. 4. Gallstones   05/02/2015 Imaging   Ct abdomen and pelvis: Slight interval increase in size of left lumbar paraspinal soft tissue metastasis. No new sites of metastatic disease identified within the abdomen or pelvis.    05/11/2015 PET scan   1. The small soft tissue mass just lateral to the left L3 for neural foramen is hypermetabolic, favoring malignancy. 2. There is also a small focus of hypermetabolic activity between the left L1 and L2 transverse processes, but without a CT correlate. 3. The 13 mm sub solid nodule in the superior segment right lower lobe is stable from recent exams but has slowly increased in size over the last 7 years. Although not hypermetabolic, the appearance is concerning for the possibility of a low grade  adenocarcinoma. 4. Other imaging findings of potential clinical significance: Chronic bilateral maxillary sinusitis. Coronary, aortic arch, and branch vessel atherosclerotic vascular disease. Aortoiliac atherosclerotic vascular disease. Cholelithiasis. Sigmoid colon diverticulosis. Lumbar scoliosis.   07/11/2015 Imaging   Ct scan of chest, abdomen and pelvis: 1. 13 mm mixed solid and sub solid nodule in the posterior right lower lobe was present in 2009 and has clearly progressed in the interval since that study. Imaging features remain highly concerning for low-grade or well differentiated adenocarcinoma. 2. Slight increase size of in the hypermetabolic, rim enhancing nodule identified adjacent to the left L3-4 neural foramen. Metastatic disease remains a concern. 3. No evidence for discrete soft tissue lesion between the left L1 and 2 transverse processes, the site of focal FDG uptake on the recent PET-CT. 4. Cholelithiasis. 5. Abdominal aortic atherosclerosis   08/03/2015 - 01/16/2016 Chemotherapy   She received carboplatin and Taxol   11/06/2015 PET scan   1. Hypermetabolic left N4-6 paraspinous metastasis (biopsy-proven) is stable in size and slightly decreased in metabolism. 2. Previously described small focus of hypermetabolism between the left L2 and L3 spinous processes has resolved. 3. New mild linear hypermetabolism to the left of the T11-12 spinous processes without discrete mass on the CT images, favor benign activity related activity, recommend attention on follow-up PET-CT.  4. No definite new sites of hypermetabolic metastatic disease. No recurrent hypermetabolic metastatic disease in the pelvis. 5. Interval stability of subsolid 1.3 cm superior segment right lower lobe pulmonary nodule without associated significant metabolism, which has grown compared to the 2009 chest CT study, and remain suspicious for low grade adenocarcinoma. 6. Additional findings include aortic atherosclerosis,  coronary atherosclerosis, mild multinodular goiter with no hypermetabolic thyroid nodules, cholelithiasis and moderate sigmoid diverticulosis.   02/12/2016 PET scan   1. Interval increase in size and metabolic activity of the LEFT paraspinal soft tissue metastasis. 2. New activity within the musculature of the LEFT chest wall. Given the unusual location of the paraspinal metastasis cannot exclude a second soft tissue metastasis to the musculature however favor benign physiologic activity. 3. Stable RIGHT lower lobe pulmonary nodule. 4. No evidence of local recurrence.     04/08/2016 Imaging   Ct scan of chest, abdomen and pelvis: 1. Similar size of a  left paravertebral abdominal lesion. 2. No new or progressive metastatic disease. 3. No correlate for the muscular activity about the lateral left chest wall. 4.  Coronary artery atherosclerosis. Aortic atherosclerosis. 5. Cholelithiasis. 6. Similar right lower lobe pulmonary nodule.   06/03/2016 Imaging   CT abdomen and pelvis 1. Similar to mild enlargement of a paravertebral soft tissue lesion at the L3-4 level. 2. No  new sites of disease identified. 3. Cholelithiasis. 4. Hysterectomy. 5.  Aortic atherosclerosis.    09/02/2016 Imaging   CT abdomen and pelvis 1. Continue mild interval increase in size of LEFT paraspinal mass (1-2 mm). 2. No evidence of new metastatic disease in the abdomen pelvis. 3. Post hysterectomy anatomy   09/26/2016 Imaging   MR lumbar spine 1. Left paraspinal metastasis with epicenter adjacent to the left L3 neural foramen does extend into the foramen to the level of the left lateral epidural space (series 9, image 31), but also tracks cephalad and caudal along the L2-L4 lumbar plexus (series 10, image 15). No extension into the left L2 or L4 neural foramina. And no more distal extension of tumor. 2. Abnormal signal in the medial left psoas and ventral left erector spinae muscles at L2 and L3 is probably  denervation related. 3. No bone invasion or osseous metastatic disease. Suspect previous radiation of the L2 through L5 spinal levels. 4. No dural or intradural metastatic disease.    10/22/2016 Procedure   She underwent stereotactic Radiosurgery   10/30/2016 Genetic Testing   Patient has genetic testing done for Inheritable genetic mutation panel. Results revealed patient has no actionable mutation.   01/21/2017 Imaging   1. Reduced size and conspicuity of the left paraspinal mass at the L3-4 level. The mass still present but has enhancement similar to that of adjacent psoas musculature. 2. No new metastatic disease is identified. 3. Other imaging findings of potential clinical significance: Cholelithiasis. Aortic Atherosclerosis (ICD10-I70.0). Sigmoid diverticulosis. Lumbar scoliosis.   05/09/2017 Imaging   Ct scan of abdomen and pelvis 1. Paraspinal mass adjacent to the left side of L3-L4 is less distinct than prior examinations, and is therefore difficult to discretely measure, however, the overall appearance suggests continued positive response to therapy. 2. No new signs of metastatic disease elsewhere in the abdomen or pelvis. 3. Aortic atherosclerosis, in addition to at least right coronary artery disease. Assessment for potential risk factor modification, dietary therapy or pharmacologic therapy may be warranted, if clinically indicated. 4. Colonic diverticulosis without evidence of acute diverticulitis at this time. 5. Additional incidental findings, as above. Aortic Atherosclerosis (ICD10-I70.0).   08/14/2017 Imaging   1. Treated left paraspinal mass centered at L3-4. Mass size is stable but there has been some evolution of tumor characteristics with less central fluid seen today. Recommend continued surveillance. 2. No evidence of untreated metastasis. 3. Degenerative changes and related impingement are described above.   10/28/2017 Imaging   Stable small left paraspinal soft  tissue mass. No new or progressive disease within the abdomen or pelvis.  Cholelithiasis.  No radiographic evidence of cholecystitis.  Colonic diverticulosis, without radiographic evidence of diverticulitis.   04/30/2018 Imaging   Stable post treatment change of the partially necrotic LEFT paravertebral mass at L3-L4. 18 x 21 mm cross-section. Mass effect on the LEFT L3 nerve root redemonstrated. Enhancing and atrophic LEFT psoas is inseparable.   10/15/2018 Imaging   1. Stable post treatment size and appearance of partially necrotic left paravertebral mass at L3-4, measuring 19 x 25 mm on current exam (unchanged when measured at similar level on previous study). Mass effect on the adjacent left L3 nerve root is unchanged. Adjacent enhancing and atrophic left psoas muscle. 2. Left convex scoliosis with associated multilevel degenerative spondylolysis and facet arthrosis, unchanged   10/29/2018 Imaging   Stable small left L3 paraspinal soft tissue mass. No evidence of new or progressive metastatic disease, or other acute findings.   Cholelithiasis.  No radiographic evidence of cholecystitis.   Colonic diverticulosis, without radiographic evidence of diverticulitis.   11/11/2018 PET scan   1. Low level metabolism (max SUV 2.0) associated with the irregular subsolid superior segment right lower lobe 2.1 cm pulmonary nodule. As better depicted on the recent diagnostic chest CT study, this nodule crosses the major fissure into the right upper lobe and has increased in size and density on multiple imaging studies back to 2009. Findings are most suggestive of primary bronchogenic adenocarcinoma. 2. No hypermetabolic thoracic adenopathy. 3. Persistent hypermetabolism (max SUV 8.0) associated with the known left L3-4 paraspinous metastasis, mildly decreased in metabolism since 02/12/2016 PET-CT. 4. No additional sites of hypermetabolic metastatic disease in the abdomen or pelvis. No metabolic evidence  of peritoneal recurrence. No ascites. 5. Chronic findings include: Aortic Atherosclerosis (ICD10-I70.0). Coronary atherosclerosis. Chronic bilateral maxillary sinusitis. Cholelithiasis. Moderate sigmoid diverticulosis.   01/07/2019 - 04/04/2019 Chemotherapy   The patient had carboplatin and Alimta for chemotherapy treatment.     05/03/2019 -  Chemotherapy   The patient had Pembrolizumab and Lenvima for chemotherapy treatment.     07/23/2019 PET scan   1. 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 No new sites of hypermetabolic metastatic disease on today's study. 2. Stable appearance of surgical changes in the right lung without features to suggest local recurrence or metastatic lung cancer. 3. Cholelithiasis. 4.  Aortic Atherosclerois (ICD10-170.0)   10/20/2019 Imaging   Bilateral venous Doppler US RIGHT:  - Findings consistent with acute and occlusive deep vein thrombosis involving the right common femoral vein, right external iliac vein, right femoral vein, right popliteal vein, right posterior tibial veins, and right peroneal veins.   Unable to adequately visualize the common iliac veins due to bowel gas. The imaged portions of the IVC were patent.     LEFT:  - No evidence of common femoral vein obstruction.    11/08/2019 PET scan   1. Further reduction in activity at the left L3 level and left paraspinal tissues at L3. Maximum vertebral SUV currently 3.9, previously 4.8 on 07/23/2019 and previously 10.0 on 04/14/2019. 2. Late phase healing of previous left transverse process fractures at L3 and L4. Suspected healing right sixth rib fracture anteriorly. 3. There is new focal activity in the vicinity of the right internal jugular vein in the lower neck. Maximum SUV in this vicinity is 5.1. I not see a well-defined lymph node are thyroid lesion to corroborate with the focus, but surveillance in this region is suggested. 4. Other  imaging findings of potential clinical significance: Aortic Atherosclerosis (ICD10-I70.0). Coronary atherosclerosis. Cholelithiasis. Sigmoid colon diverticulosis.   08/16/2020 PET scan   Signs of nodal recurrence in the LEFT pelvis as described.   Increased metabolic activity at L3 without discrete visible lesion, subtle sclerosis in this area seen in the context of degenerative change. But seen also at the site of previous disease suspicious for disease recurrence and with metabolic activity considerably increased compared to previous imaging.   Signs of partial lung resection in the RIGHT chest as before.   Muscular activity in the bilateral abductor compartment of the upper thigh RIGHT greater than LEFT felt to be physiologic. Correlate with any symptoms in this location.   Aortic atherosclerosis.   08/28/2020 -  Chemotherapy    Patient is on Treatment Plan: UTERINE LENVATINIB + PEMBROLIZUMAB Q21D      Metastatic cancer to bone ([place])  06/27/2014 Initial Diagnosis   Metastatic cancer to  bone   Primary lung cancer, right ([place])  11/04/2018 Initial Diagnosis   Primary lung cancer, right ([place])   11/11/2018 PET scan   1. Low level metabolism (max SUV 2.0) associated with the irregular subsolid superior segment right lower lobe 2.1 cm pulmonary nodule. As better depicted on the recent diagnostic chest CT study, this nodule crosses the major fissure into the right upper lobe and has increased in size and density on multiple imaging studies back to 2009. Findings are most suggestive of primary bronchogenic adenocarcinoma. 2. No hypermetabolic thoracic adenopathy. 3. Persistent hypermetabolism (max SUV 8.0) associated with the known left L3-4 paraspinous metastasis, mildly decreased in metabolism since 02/12/2016 PET-CT. 4. No additional sites of hypermetabolic metastatic disease in the abdomen or pelvis. No metabolic evidence of peritoneal recurrence. No ascites. 5. Chronic findings include:  Aortic Atherosclerosis (ICD10-I70.0). Coronary atherosclerosis. Chronic bilateral maxillary sinusitis. Cholelithiasis. Moderate sigmoid diverticulosis.   12/07/2018 Pathology Results   1. Lung, resection (segmental or lobe), Right Lobe Superior with endblock wedge of right upper lobe - INVASIVE ADENOCARCINOMA, MODERATELY DIFFERENTIATED, SPANNING 1.6 CM. - THE SURGICAL RESECTION MARGINS ARE NEGATIVE FOR CARCINOMA. - SEE ONCOLOGY TABLE BELOW. 2. Lymph node, biopsy, Level 9 #1 - THERE IS NO EVIDENCE OF CARCINOMA IN 1 OF 1 LYMPH NODE (0/1). 3. Lymph node, biopsy, Level 9 #2 - THERE IS NO EVIDENCE OF CARCINOMA IN 1 OF 1 LYMPH NODE (0/1). 4. Lymph node, biopsy, Level 7 #1 - METASTATIC CARCINOMA IN 1 OF 1 LYMPH NODE (1/1). 5. Lymph node, biopsy, Level 7 #2 - THERE IS NO EVIDENCE OF CARCINOMA IN 1 OF 1 LYMPH NODE (0/1). 6. Lymph node, biopsy, Level 12 #1 - THERE IS NO EVIDENCE OF CARCINOMA IN 1 OF 1 LYMPH NODE (0/1). 7. Lymph node, biopsy, Level 12 #2 - THERE IS NO EVIDENCE OF CARCINOMA IN 1 OF 1 LYMPH NODE (0/1). 8. Lymph node, biopsy, Level 10 #1 - THERE IS NO EVIDENCE OF CARCINOMA IN 1 OF 1 LYMPH NODE (0/1). 9. Lymph node, biopsy, Level 4R #1 - THERE IS NO EVIDENCE OF CARCINOMA IN 1 OF 1 LYMPH NODE (0/1). 10. Lymph node, biopsy, Level 4R #2 - THERE IS NO EVIDENCE OF CARCINOMA IN 1 OF 1 LYMPH NODE (0/1). 11. Lymph node, biopsy, Level 2R #1 - THERE IS NO EVIDENCE OF CARCINOMA IN 1 OF 1 LYMPH NODE (0/1). 12. Lung, resection (segmental or lobe), Right Lobe Superior - BENIGN LUNG PARENCHYMA. - THERE IS NO EVIDENCE OF MALIGNANCY. Procedure: Segmentectomy. Specimen Laterality: Right. Tumor Site: Right superior lobe. Tumor Size: 1.6 cm (gross measurement). Tumor Focality: Unifocal. Histologic Type: Adenocarcinoma, moderately differentiated. Visceral Pleura Invasion: Not identified. Lymphovascular Invasion: Not identified. Direct Invasion of Adjacent Structures: Not identified. Margins:  Negative for carcinoma. Treatment Effect: N/A Regional Lymph Nodes: Number of Lymph Nodes Involved: 1 (level 7) Number of Lymph Nodes Examined: 10 Pathologic Stage Classification (pTNM, AJCC 8th Edition): pT1b, pN2 Ancillary Studies: The tumor cells are positive for Napsin-A, TTF-1, and p53. They are essentially negative for estrogen receptor, PAX-8 and progesterone receptor. Additional studies can be performed upon clinician request. Representative Tumor Block: 1D-1E. (JBK:gt, 12/09/18)   12/07/2018 Surgery   PREOPERATIVE DIAGNOSIS:  Right lung nodule.   POSTOPERATIVE DIAGNOSIS:  Non-small cell carcinoma- suspected primary lung carcinoma, clinical stage T2N01b.   PROCEDURE:   Right video-assisted thoracoscopy, Right lower lobe superior segmentectomy with en bloc wedge resection of right upper lobe, Lymph node dissection, Intercostal nerve block levels 3-9.   SURGEON:  [place] Charon,  MD   FINDINGS:  Mass palpable in posterior aspect of superior segment of right lower lobe, likely involvement across fissure into the upper lobe.  Frozen section revealed non-small cell carcinoma. upper and lower lobe margins clear.   12/23/2018 Cancer Staging   Staging form: Lung, AJCC 8th Edition - Pathologic: Stage IIIA (pT1b, pN2, cM0) - Signed by Artis Delay, MD on 12/23/2018   01/05/2019 Imaging   MRI brain 1. No metastatic disease or acute intracranial abnormality identified. 2. Small chronic parafalcine meningioma size is stable since that described in 2006 (12 x 15 mm). 3. Advanced signal changes in the cerebral white matter and to a lesser extent pons, nonspecific but most commonly due to chronic small vessel disease. 4. Partially visible degenerative cervical spinal stenosis.     01/07/2019 -  Chemotherapy   The patient had carboplatin and Alimta for chemotherapy treatment.     04/14/2019 PET scan   1. 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 As shown on  recent MRI of 04/01/2019, there is a new component of invasion into the left lower L3 vertebral body. This new component has a maximum SUV of 10.0, compatible with active malignancy. 2. Interval wedge resection of portions of the right upper lobe and right lower lobe at the site of the prior nodule. There is only minimal low-grade activity along the wedge resection clips, maximum SUV of 1.5. Surveillance is likely warranted. No new nodule identified. 3. Other imaging findings of potential clinical significance: Aortic Atherosclerosis (ICD10-I70.0). Coronary atherosclerosis. Thoracolumbar scoliosis. Cholelithiasis. Sigmoid colon diverticulosis.     05/03/2019 -  Chemotherapy   The patient had Pembrolizumab and Lenvima for chemotherapy treatment.     05/22/2019 Imaging   Ct head Atrophy, chronic microvascular disease.   No acute intracranial abnormality.     03/09/2020 PET scan   1. No evidence of malignant disease progression. 2. Similar mild metabolic activity within at the sclerotic L3 vertebral body lesion. Reduction in paraspinal muscular metabolic activity at L3 vertebral body level.     REVIEW OF SYSTEMS:   Constitutional: Denies fevers, chills or abnormal weight loss Eyes: Denies blurriness of vision Ears, nose, mouth, throat, and face: Denies mucositis or sore throat Respiratory: Denies cough, dyspnea or wheezes Cardiovascular: Denies palpitation, chest discomfort or lower extremity swelling Gastrointestinal:  Denies nausea, heartburn or change in bowel habits Skin: Denies abnormal skin rashes Lymphatics: Denies new lymphadenopathy or easy bruising Neurological:Denies numbness, tingling or new weaknesses Behavioral/Psych: Mood is stable, no new changes  All other systems were reviewed with the patient and are negative.  I have reviewed the past medical history, past surgical history, social history and family history with the patient and they are unchanged from previous  note.  ALLERGIES:  is allergic to ace inhibitors, dilaudid [hydromorphone hcl], and codeine.  MEDICATIONS:  Current Outpatient Medications  Medication Sig Dispense Refill  . calcium carbonate (TUMS - DOSED IN MG ELEMENTAL CALCIUM) 500 MG chewable tablet Chew 1 tablet by mouth 2 (two) times daily.    . cholecalciferol (VITAMIN D3) 25 MCG (1000 UNIT) tablet Take 2,000 Units by mouth daily.    Marland Kitchen atenolol (TENORMIN) 25 MG tablet Take 25 mg by mouth 2 (two) times daily.    Marland Kitchen donepezil (ARICEPT) 5 MG tablet Take 1 tablet (5 mg total) by mouth at bedtime. 30 tablet 5  . furosemide (LASIX) 20 MG tablet Take 1 tablet (20 mg total) by mouth daily. 10 tablet 0  . lenvatinib 20  mg daily dose (LENVIMA) 2 x 10 MG capsule Take 1 capsule (10 mg total) by mouth daily. 30 capsule 11  . lidocaine-prilocaine (EMLA) cream Apply to [facility] cath 1-2 hours prior to access as directed 30 g 1  . lidocaine-prilocaine (EMLA) cream Apply to affected area once 30 g 3  . LORazepam (ATIVAN) 0.5 MG tablet 1 tablet po 30 minutes prior to radiation or MRI 30 tablet 0  . memantine (NAMENDA) 10 MG tablet TAKE 1 TABLET BY MOUTH TWICE A DAY 180 tablet 2  . mirtazapine (REMERON) 7.5 MG tablet TAKE 1 TABLET (7.5 MG TOTAL) BY MOUTH AT BEDTIME. 90 tablet 2  . Multiple Vitamin (MULTIVITAMIN WITH MINERALS) TABS tablet Take 1 tablet by mouth daily.    . ondansetron (ZOFRAN) 8 MG tablet Take 1 tablet (8 mg total) by mouth 2 (two) times daily as needed (Nausea or vomiting). 30 tablet 1  . potassium chloride (KLOR-CON) 10 MEQ tablet Take 1 tablet (10 mEq total) by mouth daily. 10 tablet 0  . prochlorperazine (COMPAZINE) 10 MG tablet Take 1 tablet (10 mg total) by mouth every 6 (six) hours as needed (Nausea or vomiting). 30 tablet 1  . rivaroxaban (XARELTO) 10 MG TABS tablet Take 1 tablet (10 mg total) by mouth daily. 30 tablet 11  . simvastatin (ZOCOR) 20 MG tablet Take 20 mg by mouth every evening.      No current facility-administered  medications for this visit.   Facility-Administered Medications Ordered in Other Visits  Medication Dose Route Frequency Provider Last Rate Last Admin  . heparin lock flush 100 unit/mL  500 Units Intracatheter Once Jamere Stidham, MD      . sodium chloride flush (NS) 0.9 % injection 10 mL  10 mL Intracatheter Once Alvy Bimler, Ratasha Fabre, MD        PHYSICAL EXAMINATION: ECOG PERFORMANCE STATUS: 1 - Symptomatic but completely ambulatory  Vitals:   08/17/20 1020  BP: (!) 169/64  Pulse: 81  Resp: 18  Temp: 97.7 F (36.5 C)  SpO2: 98%   Filed Weights   08/17/20 1020  Weight: 128 lb 6.4 oz (58.2 kg)    GENERAL:alert, no distress and comfortable SKIN: skin color, texture, turgor are normal, no rashes or significant lesions EYES: normal, Conjunctiva are pink and non-injected, sclera clear OROPHARYNX:no exudate, no erythema and lips, buccal mucosa, and tongue normal  NECK: supple, thyroid normal size, non-tender, without nodularity LYMPH:  no palpable lymphadenopathy in the cervical, axillary or inguinal LUNGS: clear to auscultation and percussion  with minimal trace bilateral lower extremity edema ABDOMEN:abdomen soft, non-tender and normal bowel sounds Musculoskeletal:no cyanosis of digits and no clubbing  NEURO: alert & oriented x 3 with fluent speech, no focal motor/sensory deficits  LABORATORY DATA:  I have reviewed the data as listed    Component Value Date/Time   NA 145 08/16/2020 0955   NA 140 05/09/2017 0844   K 4.0 08/16/2020 0955   K 4.1 05/09/2017 0844   CL 111 08/16/2020 0955   CO2 23 08/16/2020 0955   CO2 26 05/09/2017 0844   GLUCOSE 91 08/16/2020 0955   GLUCOSE 82 05/09/2017 0844   BUN 25 (H) 08/16/2020 0955   BUN 20.7 05/09/2017 0844   CREATININE 0.88 08/16/2020 0955   CREATININE 0.7 05/09/2017 0844   CALCIUM 8.8 (L) 08/16/2020 0955   CALCIUM 9.3 05/09/2017 0844   PROT 6.7 08/16/2020 0955   PROT 6.8 05/09/2017 0844   ALBUMIN 3.4 (L) 08/16/2020 0955   ALBUMIN 3.6  05/09/2017  0844   AST 25 08/16/2020 0955   AST 27 05/09/2017 0844   ALT 19 08/16/2020 0955   ALT 17 05/09/2017 0844   ALKPHOS 52 08/16/2020 0955   ALKPHOS 43 05/09/2017 0844   BILITOT 0.6 08/16/2020 0955   BILITOT 0.81 05/09/2017 0844   GFRNONAA >60 08/16/2020 0955   GFRAA >60 02/07/2020 1109    No results found for: SPEP, UPEP  Lab Results  Component Value Date   WBC 6.0 08/16/2020   NEUTROABS 4.1 08/16/2020   HGB 11.3 (L) 08/16/2020   HCT 35.3 (L) 08/16/2020   MCV 97.0 08/16/2020   PLT 160 08/16/2020      Chemistry      Component Value Date/Time   NA 145 08/16/2020 0955   NA 140 05/09/2017 0844   K 4.0 08/16/2020 0955   K 4.1 05/09/2017 0844   CL 111 08/16/2020 0955   CO2 23 08/16/2020 0955   CO2 26 05/09/2017 0844   BUN 25 (H) 08/16/2020 0955   BUN 20.7 05/09/2017 0844   CREATININE 0.88 08/16/2020 0955   CREATININE 0.7 05/09/2017 0844      Component Value Date/Time   CALCIUM 8.8 (L) 08/16/2020 0955   CALCIUM 9.3 05/09/2017 0844   ALKPHOS 52 08/16/2020 0955   ALKPHOS 43 05/09/2017 0844   AST 25 08/16/2020 0955   AST 27 05/09/2017 0844   ALT 19 08/16/2020 0955   ALT 17 05/09/2017 0844   BILITOT 0.6 08/16/2020 0955   BILITOT 0.81 05/09/2017 0844       RADIOGRAPHIC STUDIES: I have reviewed multiple imaging studies with the patient and her husband I have personally reviewed the radiological images as listed and agreed with the findings in the report. NM PET Image Restage (PS) Skull Base to Thigh  Result Date: 08/17/2020 CLINICAL DATA:  Subsequent treatment strategy for endometrial cancer surveillance also with history of non-small cell lung cancer by report in this 80 year old female. EXAM: NUCLEAR MEDICINE PET SKULL BASE TO THIGH TECHNIQUE: 6.1 mCi F-18 FDG was injected intravenously. Full-ring PET imaging was performed from the skull base to thigh after the radiotracer. CT data was obtained and used for attenuation correction and anatomic localization.  Fasting blood glucose: 81 mg/dl COMPARISON:  Multiple prior PET exams most recent from October of 2021 FINDINGS: Mediastinal blood pool activity: SUV max 1.51 Liver activity: SUV max NA NECK: No hypermetabolic lymph nodes in the neck. Incidental CT findings: none CHEST: No hypermetabolic mediastinal or hilar nodes. No suspicious pulmonary nodules on the CT scan. Incidental CT findings: Signs of partial lung resection in the RIGHT chest as before. No effusion. No consolidation. No suspicious nodule. Airways are patent. Three-vessel coronary artery disease. Normal heart size. No pericardial effusion. Aortic atherosclerosis without dilation. Normal caliber central pulmonary vasculature. Limited assessment of cardiovascular structures given lack of intravenous contrast. Changes of RIGHT axillary dissection. ABDOMEN/PELVIS: Interval development of new hypermetabolic lymph node (image 128, series 4) 1 cm with a maximum SUV of 9.2 along the LEFT common iliac chain posteriorly. (Image 136, series 4) an additional LEFT iliac lymph node along the LEFT internal iliac chain near the iliac bifurcation anterior to the sacrum measuring approximately 8 mm short axis with a maximum SUV of 5.6 also new compared to previous imaging. Activity in the adjacent psoas is unchanged and essentially unremarkable as compared to the prior study. No additional areas of hypermetabolic activity in the abdomen or pelvis. Incidental CT findings: Liver, gallbladder, spleen, pancreas and adrenal glands without acute process.  Cholelithiasis. Smooth contour the bilateral kidneys. No hydronephrosis. No acute finding related to the gastrointestinal tract. Post hysterectomy. Aortic atherosclerosis without aneurysm. SKELETON: Marked hypermetabolic activity in the posterior aspect of L3 at the site of previous increased metabolism maximum SUV of 7.9 along the posterior aspect of the vertebral body approximately 1 cm greatest dimension, just above the disc  space and without visible lesion. L4 transverse process fracture with chronic appearance. Muscular activity within the medial RIGHT thigh and LEFT thigh. RIGHT greater than LEFT without surrounding stranding or visible lesion. Felt to be more likely related to physiologic changes. Incidental CT findings: Marked levoconvex scoliotic curvature of the lumbar spine with extensive degenerative changes and LEFT lateral listhesis of L2 on L3 showing a similar appearance. IMPRESSION: Signs of nodal recurrence in the LEFT pelvis as described. Increased metabolic activity at L3 without discrete visible lesion, subtle sclerosis in this area seen in the context of degenerative change. But seen also at the site of previous disease suspicious for disease recurrence and with metabolic activity considerably increased compared to previous imaging. Signs of partial lung resection in the RIGHT chest as before. Muscular activity in the bilateral abductor compartment of the upper thigh RIGHT greater than LEFT felt to be physiologic. Correlate with any symptoms in this location. Aortic atherosclerosis. Aortic Atherosclerosis (ICD10-I70.0). Electronically Signed   By: Zetta Bills M.D.   On: 08/17/2020 09:19

## 2020-08-17 NOTE — Assessment & Plan Note (Signed)
She has history of uncontrolled hypertension Her blood pressure is mildly elevated today but could be attributed to anxiety According to her documented blood pressure from home, they were within normal range I recommend she continue to check her blood pressure twice a day and bring her documented blood pressure from home in her next visit

## 2020-08-17 NOTE — Assessment & Plan Note (Signed)
I have reviewed multiple PET CT imaging with the patient and her husband Unfortunately, she has cancer relapse in regional lymph node and the bony metastasis in her lumbar spine Thankfully, she is not symptomatic Previously, she had excellent response to treatment with pembrolizumab plus lenvatinib Her treatment was discontinued due to difficulties controlling side effects Moving forward, all her side effects is resolved I have discussed the case with her primary care doctor and both of [place] will be monitoring her very closely for anticipated side effects in the future, including her previous issues with uncontrolled hypertension I will start upfront dose adjustment of lenvatinib at 10 mg daily We will try to get her scheduled for treatment starting April 18

## 2020-08-17 NOTE — Telephone Encounter (Addendum)
Oral Oncology Pharmacist Encounter  Received prescription for Lenvima (lenvatinib) for the treatment of relapsed metastatic endometrial cancer in conjunction with pembrolizumab, planned duration until disease progression or unacceptable drug toxicity.  Prescription dose and frequency assessed for appropriateness. Appropriate for therapy initiation. MD starting on dose reduction of Lenvima to 10 mg by mouth daily given previous issues with tolerance and history of hypertension.  CBC w/ Diff, CMP and TSH from 08/16/20 assessed, labs overall stable for treatment initiation. Noted patient's BP of 169/64 mmHg on 08/17/20 - BP will be closely monitored with reinitiating Lenvima.  Current medication list in Epic reviewed, DDIs with Lenvima identified:  Risk of QTc prolongation present with concomitant use of ondansetron, donepezil, and mirtazapine - recommend baseline EKG prior to restarting Michel Santee, MD plans on ordering this at next office visit.   Evaluated chart and no patient barriers to medication adherence noted.   Patient agreement for treatment documented in MD note on 08/17/20.  Given patient's high copay for medication, Benjamine Mola will assist patient with applying and obtaining manufacturer assistance in an effort for patient to obtain medication at no cost.   Oral Oncology Clinic will continue to follow for insurance authorization, copayment issues, initial counseling and start date.  Leron Croak, PharmD, BCPS Hematology/Oncology Clinical Pharmacist [facility] [phone] 08/17/2020 11:13 AM

## 2020-08-17 NOTE — Assessment & Plan Note (Signed)
The patient has no dental issues She had received Zometa in the past Given new recurrence in her lumbar spine, I recommend resumption of Zometa every 3 months I recommend calcium with vitamin D supplement now and we will start her on Zometa on April 18

## 2020-08-22 ENCOUNTER — Telehealth: Payer: Self-pay

## 2020-08-22 NOTE — Telephone Encounter (Signed)
Oral Oncology Patient Advocate Encounter  Met patient in lobby to complete application for [employer] in an effort to reduce patient's out of pocket expense for Lenvima to $0.    Application completed and faxed to [phone].   [employer] patient assistance phone number for follow up is [phone].   This encounter will be updated until final determination.   [place] Patient [place] Phone [phone] Fax [phone] 08/22/2020 2:00 PM

## 2020-08-23 ENCOUNTER — Other Ambulatory Visit (HOSPITAL_COMMUNITY): Payer: Self-pay

## 2020-08-23 IMAGING — CR CHEST - 2 VIEW
2 series · 2 of 2 positions shown · non-contrast
Comparison: CT scan November 03, 2018 and chest x-ray April 08, 2013

CLINICAL DATA: Preoperative study prior to lung surgery.

EXAM:
CHEST - 2 VIEW

[w chest pa]
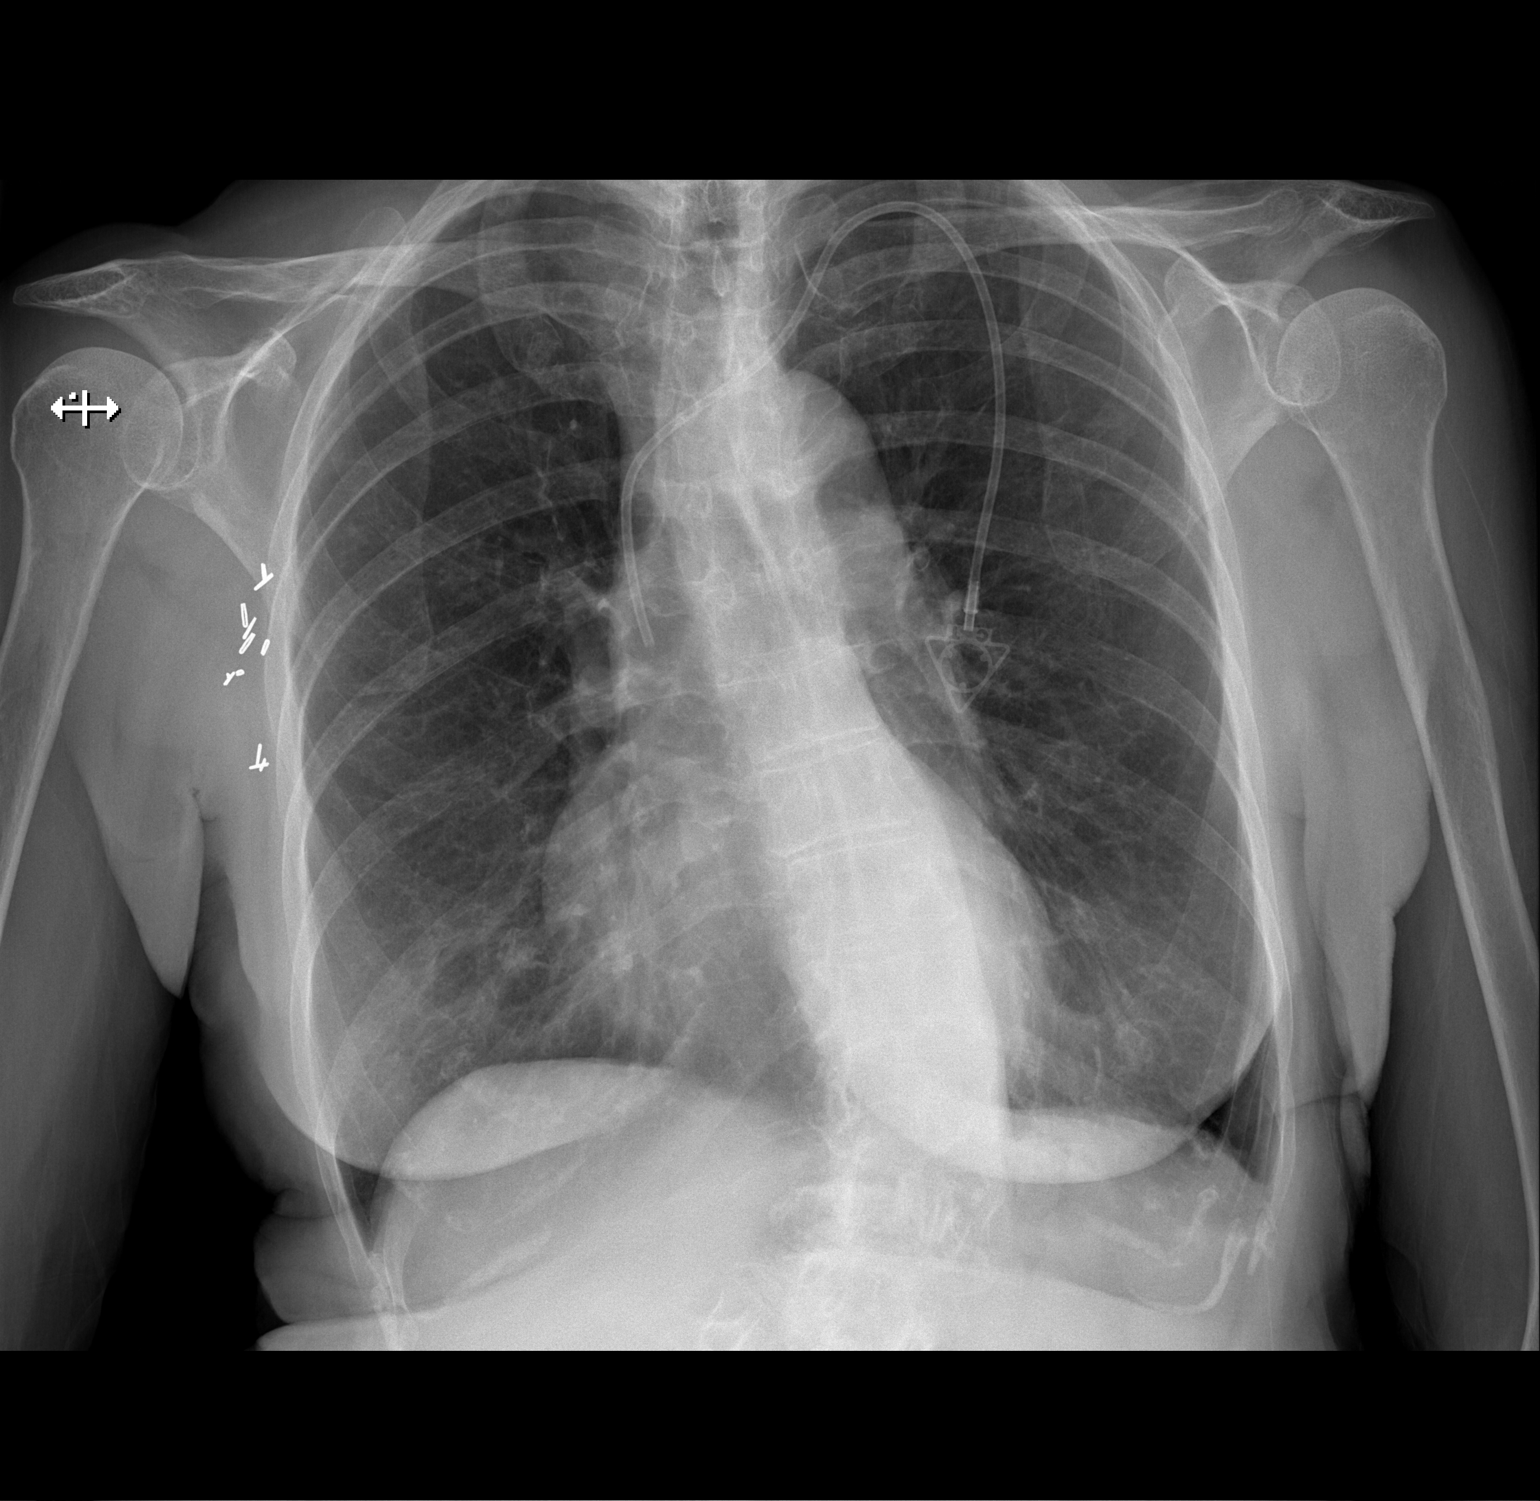

[w chest lat]
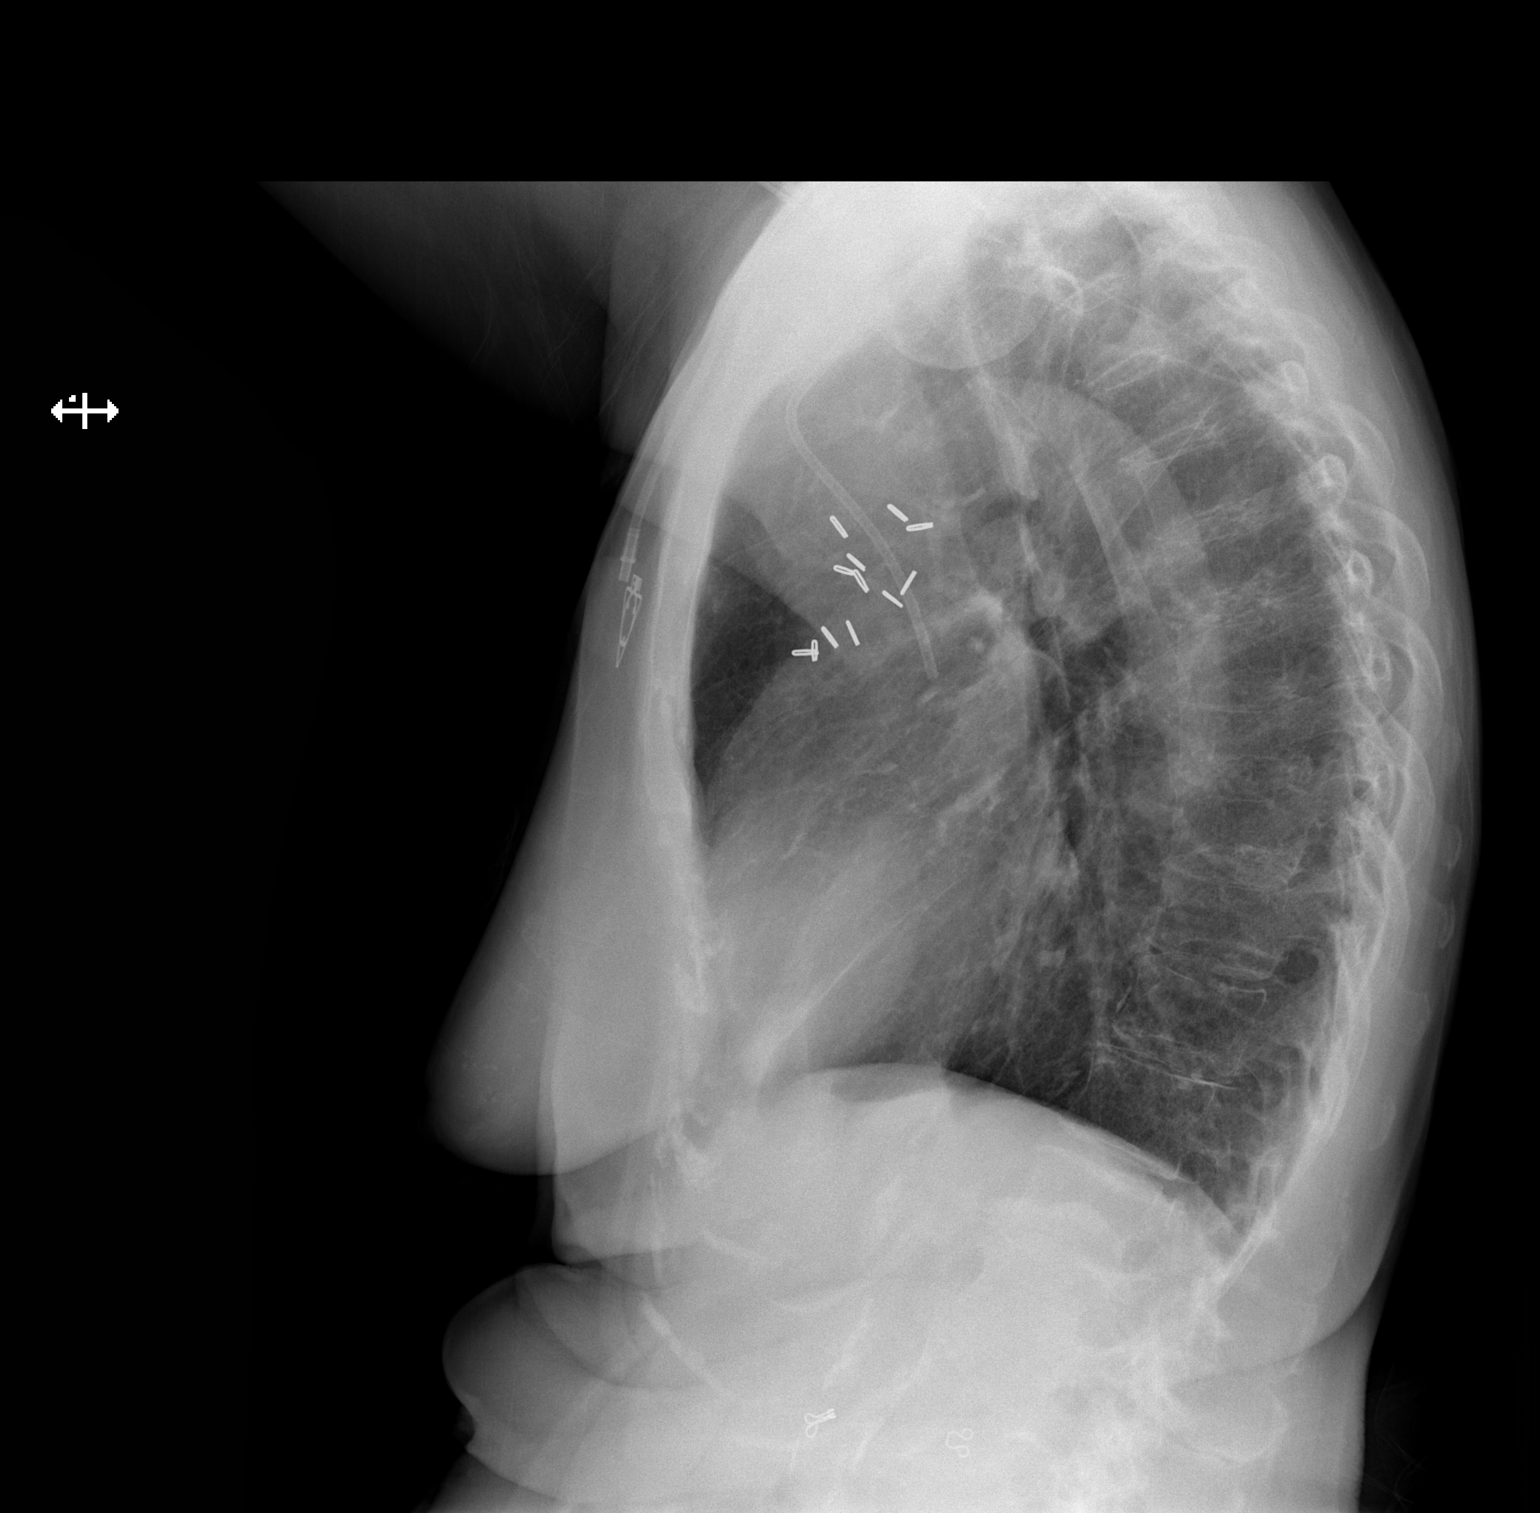

[2 of 2 positions shown; findings below may reference images not displayed]

FINDINGS: The known right-sided nodules not appreciated on this chest x-ray. A
left Port-A-Cath terminates in the central SVC. No pneumothorax.
Surgical clips are seen in the right axilla. The cardiomediastinal
silhouette is stable. No other changes.
IMPRESSION: No acute abnormalities identified. The patient's known right lower
lobe nodule is not visualized on this study.

## 2020-08-23 NOTE — Telephone Encounter (Signed)
Patient is approved for Lenvima at no cost from [facility] 08/23/20-05/12/21.  [place] uses [employer].  [place] Patient [place] Phone [phone] Fax [phone] 08/23/2020 11:21 AM

## 2020-08-23 NOTE — Telephone Encounter (Signed)
Oral Chemotherapy Pharmacist Encounter  I spoke with patient for review of: Lenvima (lenvatinib) for the treatment of relapsed metastatic endometrial cancer in conjunction with pembrolizumab, planned duration until disease progression or unacceptable toxicity.   Reviewed with patient administration, dosing, side effects, monitoring, drug-food interactions, safe handling, storage, and disposal.  Patient will take Lenvima 10mg  capsules, 1 capsule (10mg ) by mouth once daily, with or without food, at approximately the same time each day.  Lenvima start date: pending MD office visit on 08/28/20 - patient knows not to start until directed by Dr. Alvy Bimler  Adverse effects include but are not limited to: hypertension, hand-foot syndrome, diarrhea, joint pain, fatigue, headache, decreased calcium, proteinuria, increased risk of blood clots, and cardiac conduction issues.    Patient aware that she will have EKG done in office on 08/28/20. Patient will obtain anti diarrheal and alert the office of 4 or more loose stools above baseline.  Patient instructed to notify office of any upcoming invasive procedures.  Michel Santee will be held for 6 days prior to scheduled surgery, restart based on healing and clinical judgement.   Reviewed with patient importance of keeping a medication schedule and plan for any missed doses. No barriers to medication adherence identified.  Medication reconciliation performed and medication/allergy list updated.  Patient has been approved to receive Lenvima at no cost through [place]. Patient provided with manufacturer phone number to set up medication shipment [phone]).  All questions answered.  Ms. Lebo voiced understanding and appreciation.   Medication education handout placed in mail for patient. Patient knows to call the office with questions or concerns. Oral Chemotherapy Clinic phone number provided to patient.   Leron Croak, PharmD, BCPS Hematology/Oncology  Clinical Pharmacist [facility] [phone] 08/23/2020 12:00 PM

## 2020-08-28 ENCOUNTER — Inpatient Hospital Stay (HOSPITAL_BASED_OUTPATIENT_CLINIC_OR_DEPARTMENT_OTHER): Payer: Medicare Other | Admitting: Hematology and Oncology

## 2020-08-28 ENCOUNTER — Inpatient Hospital Stay: Payer: Medicare Other

## 2020-08-28 ENCOUNTER — Encounter: Payer: Self-pay | Admitting: Hematology and Oncology

## 2020-08-28 ENCOUNTER — Other Ambulatory Visit: Payer: Self-pay

## 2020-08-28 VITALS — BP 181/79 | HR 67

## 2020-08-28 DIAGNOSIS — C7951 Secondary malignant neoplasm of bone: Secondary | ICD-10-CM | POA: Diagnosis not present

## 2020-08-28 DIAGNOSIS — C3431 Malignant neoplasm of lower lobe, right bronchus or lung: Secondary | ICD-10-CM | POA: Diagnosis not present

## 2020-08-28 DIAGNOSIS — I10 Essential (primary) hypertension: Secondary | ICD-10-CM | POA: Diagnosis not present

## 2020-08-28 DIAGNOSIS — Z5112 Encounter for antineoplastic immunotherapy: Secondary | ICD-10-CM | POA: Diagnosis not present

## 2020-08-28 DIAGNOSIS — Z171 Estrogen receptor negative status [ER-]: Secondary | ICD-10-CM | POA: Diagnosis not present

## 2020-08-28 DIAGNOSIS — Z7189 Other specified counseling: Secondary | ICD-10-CM

## 2020-08-28 DIAGNOSIS — R21 Rash and other nonspecific skin eruption: Secondary | ICD-10-CM | POA: Diagnosis not present

## 2020-08-28 DIAGNOSIS — C541 Malignant neoplasm of endometrium: Secondary | ICD-10-CM

## 2020-08-28 LAB — CMP (CANCER CENTER ONLY)
ALT: 18 U/L (ref 0–44)
AST: 30 U/L (ref 15–41)
Albumin: 3.6 g/dL (ref 3.5–5.0)
Alkaline Phosphatase: 53 U/L (ref 38–126)
Anion gap: 12 (ref 5–15)
BUN: 25 mg/dL — ABNORMAL HIGH (ref 8–23)
CO2: 25 mmol/L (ref 22–32)
Calcium: 9.6 mg/dL (ref 8.9–10.3)
Chloride: 106 mmol/L (ref 98–111)
Creatinine: 1.04 mg/dL — ABNORMAL HIGH (ref 0.44–1.00)
GFR, Estimated: 54 mL/min — ABNORMAL LOW (ref >60)
Glucose, Bld: 129 mg/dL — ABNORMAL HIGH (ref 70–99)
Potassium: 3.9 mmol/L (ref 3.5–5.1)
Sodium: 143 mmol/L (ref 135–145)
Total Bilirubin: 1 mg/dL (ref 0.3–1.2)
Total Protein: 7.1 g/dL (ref 6.5–8.1)

## 2020-08-28 LAB — CBC WITH DIFFERENTIAL (CANCER CENTER ONLY)
Abs Immature Granulocytes: 0.11 10*3/uL — ABNORMAL HIGH (ref 0.00–0.07)
Basophils Absolute: 0.1 10*3/uL (ref 0.0–0.1)
Basophils Relative: 1 %
Eosinophils Absolute: 0.2 10*3/uL (ref 0.0–0.5)
Eosinophils Relative: 3 %
HCT: 37 % (ref 36.0–46.0)
Hemoglobin: 12.2 g/dL (ref 12.0–15.0)
Immature Granulocytes: 2 %
Lymphocytes Relative: 15 %
Lymphs Abs: 1.1 10*3/uL (ref 0.7–4.0)
MCH: 31.4 pg (ref 26.0–34.0)
MCHC: 33 g/dL (ref 30.0–36.0)
MCV: 95.4 fL (ref 80.0–100.0)
Monocytes Absolute: 1 10*3/uL (ref 0.1–1.0)
Monocytes Relative: 14 %
Neutro Abs: 4.7 10*3/uL (ref 1.7–7.7)
Neutrophils Relative %: 65 %
Platelet Count: 170 10*3/uL (ref 150–400)
RBC: 3.88 MIL/uL (ref 3.87–5.11)
RDW: 14.4 % (ref 11.5–15.5)
WBC Count: 7.2 10*3/uL (ref 4.0–10.5)
nRBC: 0 % (ref 0.0–0.2)

## 2020-08-28 LAB — TOTAL PROTEIN, URINE DIPSTICK: Protein, ur: NEGATIVE mg/dL

## 2020-08-28 LAB — TSH: TSH: 2.955 u[IU]/mL (ref 0.308–3.960)

## 2020-08-28 MED ORDER — SODIUM CHLORIDE 0.9 % IV SOLN
200.0000 mg | Freq: Once | INTRAVENOUS | Status: AC
  Administered 2020-08-28: 200 mg via INTRAVENOUS
  Filled 2020-08-28: qty 8

## 2020-08-28 MED ORDER — SODIUM CHLORIDE 0.9% FLUSH
10.0000 mL | INTRAVENOUS | Status: DC | PRN
Start: 2020-08-28 — End: 2020-08-28
  Administered 2020-08-28: 10 mL
  Filled 2020-08-28: qty 10

## 2020-08-28 MED ORDER — HEPARIN SOD (PORK) LOCK FLUSH 100 UNIT/ML IV SOLN
500.0000 [IU] | Freq: Once | INTRAVENOUS | Status: AC | PRN
  Administered 2020-08-28: 500 [IU]
  Filled 2020-08-28: qty 5

## 2020-08-28 MED ORDER — SODIUM CHLORIDE 0.9 % IV SOLN
Freq: Once | INTRAVENOUS | Status: AC
  Filled 2020-08-28: qty 250

## 2020-08-28 NOTE — Assessment & Plan Note (Signed)
She has skin rash on her left arm which she attributed to recent new medication for memory This has been present even before she started treatment She is in the process of getting an appointment to see an allergist about this

## 2020-08-28 NOTE — Progress Notes (Signed)
Per Dr. Alvy Bimler, okay to treat with elevated BP.

## 2020-08-28 NOTE — Assessment & Plan Note (Signed)
We will proceed with pembrolizumab and Lenvima today EKG at baseline is satisfactory I see her again next week for further follow-up and blood pressure check

## 2020-08-28 NOTE — Progress Notes (Signed)
[place] OFFICE PROGRESS NOTE  Patient Care Team: Shon Baton, MD as PCP - General (Internal Medicine) Heath Lark, MD as Consulting Physician (Hematology and Oncology)  ASSESSMENT & PLAN:  Endometrial cancer [facility]) We will proceed with pembrolizumab and Lenvima today EKG at baseline is satisfactory I see her again next week for further follow-up and blood pressure check  Skin rash She has skin rash on her left arm which she attributed to recent new medication for memory This has been present even before she started treatment She is in the process of getting an appointment to see an allergist about this  Essential hypertension She has intermittent elevated blood pressure However, her blood pressure at home is satisfactory She is instructed to check her blood pressure twice daily and I plan to see her again next week for further follow-up   No orders of the defined types were placed in this encounter.   All questions were answered. The patient knows to call the clinic with any problems, questions or concerns. The total time spent in the appointment was 20 minutes encounter with patients including review of chart and various tests results, discussions about plan of care and coordination of care plan   Heath Lark, MD 08/28/2020 12:55 PM  INTERVAL HISTORY: Please see below for problem oriented charting. She returns with her husband for further follow-up She has itchy skin rash on her left arm which she thought could be related to her memory medication Her blood pressure at home is fine She feels well and not in pain today to start treatment  SUMMARY OF ONCOLOGIC HISTORY: Oncology History Overview Note  Biopsy of recurrence 06-2014 ER negative. PDL1 testing low at 2% on the uterine cancer She progressed on carboplatin and Paclitaxel for uterine cancer HER 2 negative on pathology [id number] Negative genetic testing  On her lung cancer sample, [facility]  testing revealed 10% PD-1 positive EGFR mutation was detected, exon 19 deletion   Endometrial cancer ([place])  12/01/2007 Initial Diagnosis   She has recurrent papillary serous endometrial carcinoma. Briefly she was diagnosed in 2009 with a FIGO IB pappilary serous carcinoma treated with hysterectomy followed by adjuvant chemo and vaginal brachytherapy. She then had a recurrence diagnosed in late 2012 that was deemed not to be surgically resectable, and was treated with 6 cycles of carbo/taxol followed by 5040 cGy of EBRT to the pelvic mass. She was then followed with serial imaging and was noted in June of this year to have slight increase in the size of the mass, and again in September there was small enlargement of the mass. She had a re-staging PET scan on 03/10/13 that showed FDG activity in the pelvic mass, with no other areas of disease   12/01/2007 Imaging   CT scan of chest, abdomen and pelvis: 1.  Mass like area of decreased attenuation in the central uterus, consistent with the known endometrial carcinoma.  Deep myometrial invasion is suspected.  Pelvic MRI without and with contrast could be performed for further staging workup if clinically warranted. 2.  No evidence of extrauterine extension or lymphadenopathy. 3.  Sigmoid diverticulosis incidentally noted.   12/15/2007 Surgery   --12/2007 laparoscopic hysterectomy and PLND --05/2008 complted 6 cycles adjuvant carbo/taxol followed by vaginal cuff brachy --03/2011 exploratory lararoscopy, ureterolysis --4/13 completed 6 cycels carbotaxol --8/13 completed EBRT to the left pelvic sidewall    06/10/2008 Imaging   CT scan abdomen and pelvis 1.  Nonspecific mildly prominent inguinal lymph nodes, right greater  than left. 2.  Interval hysterectomy without evidence of recurrent pelvic mass or fluid collection.   03/22/2011 Imaging   Ct scan abdomen and pelvis 1.  Local uterine cancer recurrence with two large nodes along the left pelvic  sidewall. 2.  No evidence of bowel obstruction, urinary obstruction, or more distant metastasis.   03/31/2011 Relapse/Recurrence   chemotherapy and external beam radiation   05/16/2011 - 09/17/2011 Chemotherapy   She had 6 cycles of carboplatin and Taxol    07/17/2011 Imaging   Ct scan abdomen and pelvis 1.  Interval improvement in the previously demonstrated left pelvic local recurrence of tumor. 2.  No disease progression or complication identified. 3.  Mild bladder wall thickening on the right, nonspecific and possibly related to incomplete distension and/or radiation therapy. 4.  Cholelithiasis   10/11/2011 Imaging   CT scan abdomen and pelvis 1.  Interval enlargement of the left pelvic sidewall masses. 2.  No new nodular disease in the pelvis or lymphadenopathy. 3.  No evidence  of distant metastasis. 4.  Mild hydronephrosis on the right is similar to prior. 5.  Focal thickening of the right aspect the bladder is stable compared to prior.    10/28/2011 - 12/05/2011 Radiation Therapy   radiation for pelvic sidewall recurrence   10/28/2011 - 12/05/2011 Radiation Therapy   10/28/11-12/05/11: Radiotherapy to the left pelvic sidewall   03/18/2012 Imaging   CT abdomen 1.  Today's study demonstrates a positive response to therapy with decreased size of left pelvic sidewall nodal masses, as detailed above.  No new soft tissue masses or new lymphadenopathy is identified within the abdomen or pelvis. Continue attention on follow-up studies is recommended. 2.  Cholelithiasis without findings to suggest acute cholecystitis. 3.  Colonic diverticulosis without findings to suggest acute diverticulitis at this time. 4.  Mild asymmetric urinary bladder wall thickening is unchanged compared to prior examinations and is nonspecific.  No definite bladder wall mass is identified at this time. 5.  Additional findings, similar to prior examinations, as above   06/22/2012 Imaging   CT abdomen 1.  Further  decreased size of the left pelvic peripherally enhancing lesion. 2.  No new sites of new or progressive disease. 3. Esophageal air fluid level suggests dysmotility or gastroesophageal reflux. 4.  Cholelithiasis   11/02/2012 Imaging   CT abdomen 1.  The left pelvic side wall lesion demonstrates mild increase in size from previous exam. 2.  No new sites of new or progressive disease. 3.  Cholelithiasis.    01/28/2013 Imaging   CT abdomen 1. Interval small increase in volume of enhancing mass adjacent to the left pelvic sidewall. 2. No evidence of abdominal or pelvic lymphadenopathy   04/02/2013 Surgery   pelvic mass resection with IORT   04/02/2013 - 04/02/2013 Radiation Therapy   04/02/13: Intraoperative radiotherapy to the left pelvis   05/10/2013 PET scan   1. Hypermetabolic mass in the deep left pelvis consistent with metastasis. 2. No additional evidence of local metastasis. No evidence of distant metastasis. 3. Small right lower lobe pulmonary nodule is not hypermetabolic.   07/27/2013 Imaging   No evidence of recurrent or metastatic disease. Apparent prior resection of the deep left pelvic metastasis.   02/08/2014 Imaging   No CT findings for recurrent or metastatic disease involving the abdomen/ pelvis   06/13/2014 Relapse/Recurrence   + recurrence paraspinous muscle   06/21/2014 Procedure   There is a soft tissue lesion along the left side of L3-L4. This lesion roughly measures  up to 2.2 cm. Needle was positioned along the posterior aspect of the lesion. Small amount of air in the paraspinal tissues following needle removal   06/21/2014 Pathology Results   Bone, biopsy, left lumbar paraspinal - METASTATIC POORLY DIFFERENTIATED CARCINOMA, CONSISTENT WITH HIGH GRADE SEROUS CARCINOMA SEE COMMENT. Microscopic Comment The carcinoma demonstrates the following immunophenotype: Cytokeratin 7 - patchy moderate to strong expression Estrogen receptor - negative expression P53 -  strong diffuse expression TTF-1 - negative expression WT-1 - negative expression CD56 - focal moderate strong expression Synaptophysin - negative expression GCDFP - negative expression The history of primary endometrial papillary serous carcinoma and primary mammary carcinoma is noted. In the current case, the overall morphologic and immunophenotype are that of poorly differentiated carcinoma, consistent with high grade serous carcinoma.    07/11/2014 - 08/12/2014 Radiation Therapy   07/11/14-08/12/14: 55 Gy in 25 fractions to the Lumbar spine   08/15/2014 Imaging   CT scan of chest, abdomen and pelvis 1. Since the biopsy study of 06/21/2014, similar to slight decrease in size of a left paraspinous lesion at L3-4. 2. No new sites of metastatic disease identified. 3. 8 mm ground-glass nodule in the right lower lobe is grossly similar to 03/10/2013, suggesting a benign etiology. 4. Cholelithiasis. 5. Apparent sigmoid colonic wall thickening which could be due to underdistention. Colitis felt less likely. 6.  Atherosclerosis, including within the coronary arteries. 7. Pelvic floor laxity.   10/07/2014 Imaging   CT scan of chest, abdomen and pelvis: Stable to slight interval decrease in size of left paraspinous lesion at L3-4. No new sites of metastatic disease. Unchanged ground-glass nodule within the right lower lobe, potentially benign in etiology. Recommend attention on followup. Cholelithiasis. Sigmoid colonic diverticulosis. No CT evidence to suggest acute diverticulitis.   01/19/2015 Imaging   Ct scan of abdomen and pelvis 1. Decrease in size of left paraspinous metastasis. 2. No new sites of disease. 3. Stable ground-glass attenuating nodule within the superior segment of right lower lobe. 4. Gallstones   05/02/2015 Imaging   Ct abdomen and pelvis: Slight interval increase in size of left lumbar paraspinal soft tissue metastasis. No new sites of metastatic disease identified within the  abdomen or pelvis.    05/11/2015 PET scan   1. The small soft tissue mass just lateral to the left L3 for neural foramen is hypermetabolic, favoring malignancy. 2. There is also a small focus of hypermetabolic activity between the left L1 and L2 transverse processes, but without a CT correlate. 3. The 13 mm sub solid nodule in the superior segment right lower lobe is stable from recent exams but has slowly increased in size over the last 7 years. Although not hypermetabolic, the appearance is concerning for the possibility of a low grade adenocarcinoma. 4. Other imaging findings of potential clinical significance: Chronic bilateral maxillary sinusitis. Coronary, aortic arch, and branch vessel atherosclerotic vascular disease. Aortoiliac atherosclerotic vascular disease. Cholelithiasis. Sigmoid colon diverticulosis. Lumbar scoliosis.   07/11/2015 Imaging   Ct scan of chest, abdomen and pelvis: 1. 13 mm mixed solid and sub solid nodule in the posterior right lower lobe was present in 2009 and has clearly progressed in the interval since that study. Imaging features remain highly concerning for low-grade or well differentiated adenocarcinoma. 2. Slight increase size of in the hypermetabolic, rim enhancing nodule identified adjacent to the left L3-4 neural foramen. Metastatic disease remains a concern. 3. No evidence for discrete soft tissue lesion between the left L1 and 2 transverse processes, the site  of focal FDG uptake on the recent PET-CT. 4. Cholelithiasis. 5. Abdominal aortic atherosclerosis   08/03/2015 - 01/16/2016 Chemotherapy   She received carboplatin and Taxol   11/06/2015 PET scan   1. Hypermetabolic left G3-1 paraspinous metastasis (biopsy-proven) is stable in size and slightly decreased in metabolism. 2. Previously described small focus of hypermetabolism between the left L2 and L3 spinous processes has resolved. 3. New mild linear hypermetabolism to the left of the T11-12 spinous  processes without discrete mass on the CT images, favor benign activity related activity, recommend attention on follow-up PET-CT.  4. No definite new sites of hypermetabolic metastatic disease. No recurrent hypermetabolic metastatic disease in the pelvis. 5. Interval stability of subsolid 1.3 cm superior segment right lower lobe pulmonary nodule without associated significant metabolism, which has grown compared to the 2009 chest CT study, and remain suspicious for low grade adenocarcinoma. 6. Additional findings include aortic atherosclerosis, coronary atherosclerosis, mild multinodular goiter with no hypermetabolic thyroid nodules, cholelithiasis and moderate sigmoid diverticulosis.   02/12/2016 PET scan   1. Interval increase in size and metabolic activity of the LEFT paraspinal soft tissue metastasis. 2. New activity within the musculature of the LEFT chest wall. Given the unusual location of the paraspinal metastasis cannot exclude a second soft tissue metastasis to the musculature however favor benign physiologic activity. 3. Stable RIGHT lower lobe pulmonary nodule. 4. No evidence of local recurrence.     04/08/2016 Imaging   Ct scan of chest, abdomen and pelvis: 1. Similar size of a  left paravertebral abdominal lesion. 2. No new or progressive metastatic disease. 3. No correlate for the muscular activity about the lateral left chest wall. 4.  Coronary artery atherosclerosis. Aortic atherosclerosis. 5. Cholelithiasis. 6. Similar right lower lobe pulmonary nodule.   06/03/2016 Imaging   CT abdomen and pelvis 1. Similar to mild enlargement of a paravertebral soft tissue lesion at the L3-4 level. 2. No new sites of disease identified. 3. Cholelithiasis. 4. Hysterectomy. 5.  Aortic atherosclerosis.    09/02/2016 Imaging   CT abdomen and pelvis 1. Continue mild interval increase in size of LEFT paraspinal mass (1-2 mm). 2. No evidence of new metastatic disease in the abdomen  pelvis. 3. Post hysterectomy anatomy   09/26/2016 Imaging   MR lumbar spine 1. Left paraspinal metastasis with epicenter adjacent to the left L3 neural foramen does extend into the foramen to the level of the left lateral epidural space (series 9, image 31), but also tracks cephalad and caudal along the L2-L4 lumbar plexus (series 10, image 15). No extension into the left L2 or L4 neural foramina. And no more distal extension of tumor. 2. Abnormal signal in the medial left psoas and ventral left erector spinae muscles at L2 and L3 is probably denervation related. 3. No bone invasion or osseous metastatic disease. Suspect previous radiation of the L2 through L5 spinal levels. 4. No dural or intradural metastatic disease.    10/22/2016 Procedure   She underwent stereotactic Radiosurgery   10/30/2016 Genetic Testing   Patient has genetic testing done for Inheritable genetic mutation panel. Results revealed patient has no actionable mutation.   01/21/2017 Imaging   1. Reduced size and conspicuity of the left paraspinal mass at the L3-4 level. The mass still present but has enhancement similar to that of adjacent psoas musculature. 2. No new metastatic disease is identified. 3. Other imaging findings of potential clinical significance: Cholelithiasis. Aortic Atherosclerosis (ICD10-I70.0). Sigmoid diverticulosis. Lumbar scoliosis.   05/09/2017 Imaging   Ct  scan of abdomen and pelvis 1. Paraspinal mass adjacent to the left side of L3-L4 is less distinct than prior examinations, and is therefore difficult to discretely measure, however, the overall appearance suggests continued positive response to therapy. 2. No new signs of metastatic disease elsewhere in the abdomen or pelvis. 3. Aortic atherosclerosis, in addition to at least right coronary artery disease. Assessment for potential risk factor modification, dietary therapy or pharmacologic therapy may be warranted, if clinically indicated. 4.  Colonic diverticulosis without evidence of acute diverticulitis at this time. 5. Additional incidental findings, as above. Aortic Atherosclerosis (ICD10-I70.0).   08/14/2017 Imaging   1. Treated left paraspinal mass centered at L3-4. Mass size is stable but there has been some evolution of tumor characteristics with less central fluid seen today. Recommend continued surveillance. 2. No evidence of untreated metastasis. 3. Degenerative changes and related impingement are described above.   10/28/2017 Imaging   Stable small left paraspinal soft tissue mass. No new or progressive disease within the abdomen or pelvis.  Cholelithiasis.  No radiographic evidence of cholecystitis.  Colonic diverticulosis, without radiographic evidence of diverticulitis.   04/30/2018 Imaging   Stable post treatment change of the partially necrotic LEFT paravertebral mass at L3-L4. 18 x 21 mm cross-section. Mass effect on the LEFT L3 nerve root redemonstrated. Enhancing and atrophic LEFT psoas is inseparable.   10/15/2018 Imaging   1. Stable post treatment size and appearance of partially necrotic left paravertebral mass at L3-4, measuring 19 x 25 mm on current exam (unchanged when measured at similar level on previous study). Mass effect on the adjacent left L3 nerve root is unchanged. Adjacent enhancing and atrophic left psoas muscle. 2. Left convex scoliosis with associated multilevel degenerative spondylolysis and facet arthrosis, unchanged   10/29/2018 Imaging   Stable small left L3 paraspinal soft tissue mass. No evidence of new or progressive metastatic disease, or other acute findings.   Cholelithiasis.  No radiographic evidence of cholecystitis.   Colonic diverticulosis, without radiographic evidence of diverticulitis.   11/11/2018 PET scan   1. Low level metabolism (max SUV 2.0) associated with the irregular subsolid superior segment right lower lobe 2.1 cm pulmonary nodule. As better depicted on the recent  diagnostic chest CT study, this nodule crosses the major fissure into the right upper lobe and has increased in size and density on multiple imaging studies back to 2009. Findings are most suggestive of primary bronchogenic adenocarcinoma. 2. No hypermetabolic thoracic adenopathy. 3. Persistent hypermetabolism (max SUV 8.0) associated with the known left L3-4 paraspinous metastasis, mildly decreased in metabolism since 02/12/2016 PET-CT. 4. No additional sites of hypermetabolic metastatic disease in the abdomen or pelvis. No metabolic evidence of peritoneal recurrence. No ascites. 5. Chronic findings include: Aortic Atherosclerosis (ICD10-I70.0). Coronary atherosclerosis. Chronic bilateral maxillary sinusitis. Cholelithiasis. Moderate sigmoid diverticulosis.   01/07/2019 - 04/04/2019 Chemotherapy   The patient had carboplatin and Alimta for chemotherapy treatment.     05/03/2019 -  Chemotherapy   The patient had Pembrolizumab and Lenvima for chemotherapy treatment.     07/23/2019 PET scan   1. 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 No new sites of hypermetabolic metastatic disease on today's study. 2. Stable appearance of surgical changes in the right lung without features to suggest local recurrence or metastatic lung cancer. 3. Cholelithiasis. 4.  Aortic Atherosclerois (ICD10-170.0)   10/20/2019 Imaging   Bilateral venous Doppler US RIGHT:  - Findings consistent with acute and occlusive deep vein thrombosis involving  the right common femoral vein, right external iliac vein, right femoral vein, right popliteal vein, right posterior tibial veins, and right peroneal veins.   Unable to adequately visualize the common iliac veins due to bowel gas. The imaged portions of the IVC were patent.     LEFT:  - No evidence of common femoral vein obstruction.    11/08/2019 PET scan   1. Further reduction in activity at the left L3 level and  left paraspinal tissues at L3. Maximum vertebral SUV currently 3.9, previously 4.8 on 07/23/2019 and previously 10.0 on 04/14/2019. 2. Late phase healing of previous left transverse process fractures at L3 and L4. Suspected healing right sixth rib fracture anteriorly. 3. There is new focal activity in the vicinity of the right internal jugular vein in the lower neck. Maximum SUV in this vicinity is 5.1. I not see a well-defined lymph node are thyroid lesion to corroborate with the focus, but surveillance in this region is suggested. 4. Other imaging findings of potential clinical significance: Aortic Atherosclerosis (ICD10-I70.0). Coronary atherosclerosis. Cholelithiasis. Sigmoid colon diverticulosis.   08/16/2020 PET scan   Signs of nodal recurrence in the LEFT pelvis as described.   Increased metabolic activity at L3 without discrete visible lesion, subtle sclerosis in this area seen in the context of degenerative change. But seen also at the site of previous disease suspicious for disease recurrence and with metabolic activity considerably increased compared to previous imaging.   Signs of partial lung resection in the RIGHT chest as before.   Muscular activity in the bilateral abductor compartment of the upper thigh RIGHT greater than LEFT felt to be physiologic. Correlate with any symptoms in this location.   Aortic atherosclerosis.   08/28/2020 -  Chemotherapy    Patient is on Treatment Plan: UTERINE LENVATINIB + PEMBROLIZUMAB Q21D      Metastatic cancer to bone ([place])  06/27/2014 Initial Diagnosis   Metastatic cancer to bone   Primary lung cancer, right ([place])  11/04/2018 Initial Diagnosis   Primary lung cancer, right ([place])   11/11/2018 PET scan   1. Low level metabolism (max SUV 2.0) associated with the irregular subsolid superior segment right lower lobe 2.1 cm pulmonary nodule. As better depicted on the recent diagnostic chest CT study, this nodule crosses the major fissure into the  right upper lobe and has increased in size and density on multiple imaging studies back to 2009. Findings are most suggestive of primary bronchogenic adenocarcinoma. 2. No hypermetabolic thoracic adenopathy. 3. Persistent hypermetabolism (max SUV 8.0) associated with the known left L3-4 paraspinous metastasis, mildly decreased in metabolism since 02/12/2016 PET-CT. 4. No additional sites of hypermetabolic metastatic disease in the abdomen or pelvis. No metabolic evidence of peritoneal recurrence. No ascites. 5. Chronic findings include: Aortic Atherosclerosis (ICD10-I70.0). Coronary atherosclerosis. Chronic bilateral maxillary sinusitis. Cholelithiasis. Moderate sigmoid diverticulosis.   12/07/2018 Pathology Results   1. Lung, resection (segmental or lobe), Right Lobe Superior with endblock wedge of right upper lobe - INVASIVE ADENOCARCINOMA, MODERATELY DIFFERENTIATED, SPANNING 1.6 CM. - THE SURGICAL RESECTION MARGINS ARE NEGATIVE FOR CARCINOMA. - SEE ONCOLOGY TABLE BELOW. 2. Lymph node, biopsy, Level 9 #1 - THERE IS NO EVIDENCE OF CARCINOMA IN 1 OF 1 LYMPH NODE (0/1). 3. Lymph node, biopsy, Level 9 #2 - THERE IS NO EVIDENCE OF CARCINOMA IN 1 OF 1 LYMPH NODE (0/1). 4. Lymph node, biopsy, Level 7 #1 - METASTATIC CARCINOMA IN 1 OF 1 LYMPH NODE (1/1). 5. Lymph node, biopsy, Level 7 #2 - THERE IS NO  EVIDENCE OF CARCINOMA IN 1 OF 1 LYMPH NODE (0/1). 6. Lymph node, biopsy, Level 12 #1 - THERE IS NO EVIDENCE OF CARCINOMA IN 1 OF 1 LYMPH NODE (0/1). 7. Lymph node, biopsy, Level 12 #2 - THERE IS NO EVIDENCE OF CARCINOMA IN 1 OF 1 LYMPH NODE (0/1). 8. Lymph node, biopsy, Level 10 #1 - THERE IS NO EVIDENCE OF CARCINOMA IN 1 OF 1 LYMPH NODE (0/1). 9. Lymph node, biopsy, Level 4R #1 - THERE IS NO EVIDENCE OF CARCINOMA IN 1 OF 1 LYMPH NODE (0/1). 10. Lymph node, biopsy, Level 4R #2 - THERE IS NO EVIDENCE OF CARCINOMA IN 1 OF 1 LYMPH NODE (0/1). 11. Lymph node, biopsy, Level 2R #1 - THERE IS NO  EVIDENCE OF CARCINOMA IN 1 OF 1 LYMPH NODE (0/1). 12. Lung, resection (segmental or lobe), Right Lobe Superior - BENIGN LUNG PARENCHYMA. - THERE IS NO EVIDENCE OF MALIGNANCY. Procedure: Segmentectomy. Specimen Laterality: Right. Tumor Site: Right superior lobe. Tumor Size: 1.6 cm (gross measurement). Tumor Focality: Unifocal. Histologic Type: Adenocarcinoma, moderately differentiated. Visceral Pleura Invasion: Not identified. Lymphovascular Invasion: Not identified. Direct Invasion of Adjacent Structures: Not identified. Margins: Negative for carcinoma. Treatment Effect: N/A Regional Lymph Nodes: Number of Lymph Nodes Involved: 1 (level 7) Number of Lymph Nodes Examined: 10 Pathologic Stage Classification (pTNM, AJCC 8th Edition): pT1b, pN2 Ancillary Studies: The tumor cells are positive for Napsin-A, TTF-1, and p53. They are essentially negative for estrogen receptor, PAX-8 and progesterone receptor. Additional studies can be performed upon clinician request. Representative Tumor Block: 1D-1E. (JBK:gt, 12/09/18)   12/07/2018 Surgery   PREOPERATIVE DIAGNOSIS:  Right lung nodule.   POSTOPERATIVE DIAGNOSIS:  Non-small cell carcinoma- suspected primary lung carcinoma, clinical stage T2N01b.   PROCEDURE:   Right video-assisted thoracoscopy, Right lower lobe superior segmentectomy with en bloc wedge resection of right upper lobe, Lymph node dissection, Intercostal nerve block levels 3-9.   SURGEON:  Modesto Charon, MD   FINDINGS:  Mass palpable in posterior aspect of superior segment of right lower lobe, likely involvement across fissure into the upper lobe.  Frozen section revealed non-small cell carcinoma. upper and lower lobe margins clear.   12/23/2018 Cancer Staging   Staging form: Lung, AJCC 8th Edition - Pathologic: Stage IIIA (pT1b, pN2, cM0) - Signed by Heath Lark, MD on 12/23/2018   01/05/2019 Imaging   MRI brain 1. No metastatic disease or acute intracranial  abnormality identified. 2. Small chronic parafalcine meningioma size is stable since that described in 2006 (12 x 15 mm). 3. Advanced signal changes in the cerebral white matter and to a lesser extent pons, nonspecific but most commonly due to chronic small vessel disease. 4. Partially visible degenerative cervical spinal stenosis.     01/07/2019 -  Chemotherapy   The patient had carboplatin and Alimta for chemotherapy treatment.     04/14/2019 PET scan   1. 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 As shown on recent MRI of 04/01/2019, there is a new component of invasion into the left lower L3 vertebral body. This new component has a maximum SUV of 10.0, compatible with active malignancy. 2. Interval wedge resection of portions of the right upper lobe and right lower lobe at the site of the prior nodule. There is only minimal low-grade activity along the wedge resection clips, maximum SUV of 1.5. Surveillance is likely warranted. No new nodule identified. 3. Other imaging findings of potential clinical significance: Aortic Atherosclerosis (ICD10-I70.0). Coronary atherosclerosis. Thoracolumbar scoliosis. Cholelithiasis. Sigmoid colon diverticulosis.  05/03/2019 -  Chemotherapy   The patient had Pembrolizumab and Lenvima for chemotherapy treatment.     05/22/2019 Imaging   Ct head Atrophy, chronic microvascular disease.   No acute intracranial abnormality.     03/09/2020 PET scan   1. No evidence of malignant disease progression. 2. Similar mild metabolic activity within at the sclerotic L3 vertebral body lesion. Reduction in paraspinal muscular metabolic activity at L3 vertebral body level.     REVIEW OF SYSTEMS:   Constitutional: Denies fevers, chills or abnormal weight loss Eyes: Denies blurriness of vision Ears, nose, mouth, throat, and face: Denies mucositis or sore throat Respiratory: Denies cough, dyspnea or wheezes Cardiovascular: Denies  palpitation, chest discomfort or lower extremity swelling Lymphatics: Denies new lymphadenopathy or easy bruising Neurological:Denies numbness, tingling or new weaknesses Behavioral/Psych: Mood is stable, no new changes  All other systems were reviewed with the patient and are negative.  I have reviewed the past medical history, past surgical history, social history and family history with the patient and they are unchanged from previous note.  ALLERGIES:  is allergic to ace inhibitors, dilaudid [hydromorphone hcl], and codeine.  MEDICATIONS:  Current Outpatient Medications  Medication Sig Dispense Refill  . atenolol (TENORMIN) 25 MG tablet Take 25 mg by mouth 2 (two) times daily.    . calcium carbonate (TUMS - DOSED IN MG ELEMENTAL CALCIUM) 500 MG chewable tablet Chew 1 tablet by mouth 2 (two) times daily.    . cholecalciferol (VITAMIN D3) 25 MCG (1000 UNIT) tablet Take 2,000 Units by mouth daily.    Marland Kitchen donepezil (ARICEPT) 5 MG tablet Take 1 tablet (5 mg total) by mouth at bedtime. 30 tablet 5  . furosemide (LASIX) 20 MG tablet Take 1 tablet (20 mg total) by mouth daily. 10 tablet 0  . lenvatinib 10 mg daily dose (LENVIMA) capsule Take 1 capsule (10 mg total) by mouth daily. 30 capsule 11  . lidocaine-prilocaine (EMLA) cream Apply to [facility] cath 1-2 hours prior to access as directed 30 g 1  . lidocaine-prilocaine (EMLA) cream Apply to affected area once 30 g 3  . LORazepam (ATIVAN) 0.5 MG tablet 1 tablet po 30 minutes prior to radiation or MRI 30 tablet 0  . memantine (NAMENDA) 10 MG tablet TAKE 1 TABLET BY MOUTH TWICE A DAY 180 tablet 2  . mirtazapine (REMERON) 7.5 MG tablet TAKE 1 TABLET (7.5 MG TOTAL) BY MOUTH AT BEDTIME. 90 tablet 2  . Multiple Vitamin (MULTIVITAMIN WITH MINERALS) TABS tablet Take 1 tablet by mouth daily.    . ondansetron (ZOFRAN) 8 MG tablet Take 1 tablet (8 mg total) by mouth 2 (two) times daily as needed (Nausea or vomiting). 30 tablet 1  . potassium chloride  (KLOR-CON) 10 MEQ tablet Take 1 tablet (10 mEq total) by mouth daily. 10 tablet 0  . prochlorperazine (COMPAZINE) 10 MG tablet Take 1 tablet (10 mg total) by mouth every 6 (six) hours as needed (Nausea or vomiting). 30 tablet 1  . rivaroxaban (XARELTO) 10 MG TABS tablet Take 1 tablet (10 mg total) by mouth daily. 30 tablet 11  . simvastatin (ZOCOR) 20 MG tablet Take 20 mg by mouth every evening.      No current facility-administered medications for this visit.   Facility-Administered Medications Ordered in Other Visits  Medication Dose Route Frequency Provider Last Rate Last Admin  . heparin lock flush 100 unit/mL  500 Units Intracatheter Once Delmore Sear, MD      . heparin lock flush 100 unit/mL  500 Units Intracatheter Once PRN Heath Lark, MD      . pembrolizumab (KEYTRUDA) 200 mg in sodium chloride 0.9 % 50 mL chemo infusion  200 mg Intravenous Once Ofilia Rayon, MD      . sodium chloride flush (NS) 0.9 % injection 10 mL  10 mL Intracatheter Once Alvy Bimler, Bianco Cange, MD      . sodium chloride flush (NS) 0.9 % injection 10 mL  10 mL Intracatheter PRN Alvy Bimler, Melesa Lecy, MD        PHYSICAL EXAMINATION: ECOG PERFORMANCE STATUS: 1 - Symptomatic but completely ambulatory  Vitals:   08/28/20 1230  BP: (!) 185/84  Pulse: 75  Resp: 18  Temp: 97.6 F (36.4 C)  SpO2: 100%   Filed Weights   08/28/20 1230  Weight: 127 lb 9.6 oz (57.9 kg)    GENERAL:alert, no distress and comfortable SKIN: Noted skin rash on the left arm EYES: normal, Conjunctiva are pink and non-injected, sclera clear OROPHARYNX:no exudate, no erythema and lips, buccal mucosa, and tongue normal  NECK: supple, thyroid normal size, non-tender, without nodularity LYMPH:  no palpable lymphadenopathy in the cervical, axillary or inguinal LUNGS: clear to auscultation and percussion with normal breathing effort HEART: regular rate & rhythm and no murmurs with mild trace bilateral lower extremity edema ABDOMEN:abdomen soft, non-tender and  normal bowel sounds Musculoskeletal:no cyanosis of digits and no clubbing  NEURO: alert & oriented x 3 with fluent speech, no focal motor/sensory deficits  LABORATORY DATA:  I have reviewed the data as listed    Component Value Date/Time   NA 143 08/28/2020 1155   NA 140 05/09/2017 0844   K 3.9 08/28/2020 1155   K 4.1 05/09/2017 0844   CL 106 08/28/2020 1155   CO2 25 08/28/2020 1155   CO2 26 05/09/2017 0844   GLUCOSE 129 (H) 08/28/2020 1155   GLUCOSE 82 05/09/2017 0844   BUN 25 (H) 08/28/2020 1155   BUN 20.7 05/09/2017 0844   CREATININE 1.04 (H) 08/28/2020 1155   CREATININE 0.7 05/09/2017 0844   CALCIUM 9.6 08/28/2020 1155   CALCIUM 9.3 05/09/2017 0844   PROT 7.1 08/28/2020 1155   PROT 6.8 05/09/2017 0844   ALBUMIN 3.6 08/28/2020 1155   ALBUMIN 3.6 05/09/2017 0844   AST 30 08/28/2020 1155   AST 27 05/09/2017 0844   ALT 18 08/28/2020 1155   ALT 17 05/09/2017 0844   ALKPHOS 53 08/28/2020 1155   ALKPHOS 43 05/09/2017 0844   BILITOT 1.0 08/28/2020 1155   BILITOT 0.81 05/09/2017 0844   GFRNONAA 54 (L) 08/28/2020 1155   GFRAA >60 02/07/2020 1109    No results found for: SPEP, UPEP  Lab Results  Component Value Date   WBC 7.2 08/28/2020   NEUTROABS 4.7 08/28/2020   HGB 12.2 08/28/2020   HCT 37.0 08/28/2020   MCV 95.4 08/28/2020   PLT 170 08/28/2020      Chemistry      Component Value Date/Time   NA 143 08/28/2020 1155   NA 140 05/09/2017 0844   K 3.9 08/28/2020 1155   K 4.1 05/09/2017 0844   CL 106 08/28/2020 1155   CO2 25 08/28/2020 1155   CO2 26 05/09/2017 0844   BUN 25 (H) 08/28/2020 1155   BUN 20.7 05/09/2017 0844   CREATININE 1.04 (H) 08/28/2020 1155   CREATININE 0.7 05/09/2017 0844      Component Value Date/Time   CALCIUM 9.6 08/28/2020 1155   CALCIUM 9.3 05/09/2017 0844   ALKPHOS 53 08/28/2020 1155  ALKPHOS 43 05/09/2017 0844   AST 30 08/28/2020 1155   AST 27 05/09/2017 0844   ALT 18 08/28/2020 1155   ALT 17 05/09/2017 0844   BILITOT 1.0  08/28/2020 1155   BILITOT 0.81 05/09/2017 0844

## 2020-08-28 NOTE — Patient Instructions (Signed)
[facility] Discharge Instructions for Patients Receiving Chemotherapy  Today you received the following chemotherapy agents :  Keytruda.  To help prevent nausea and vomiting after your treatment, we encourage you to take your nausea medication as prescribed.   If you develop nausea and vomiting that is not controlled by your nausea medication, call the clinic.   BELOW ARE SYMPTOMS THAT SHOULD BE REPORTED IMMEDIATELY:  *FEVER GREATER THAN 100.5 F  *CHILLS WITH OR WITHOUT FEVER  NAUSEA AND VOMITING THAT IS NOT CONTROLLED WITH YOUR NAUSEA MEDICATION  *UNUSUAL SHORTNESS OF BREATH  *UNUSUAL BRUISING OR BLEEDING  TENDERNESS IN MOUTH AND THROAT WITH OR WITHOUT PRESENCE OF ULCERS  *URINARY PROBLEMS  *BOWEL PROBLEMS  UNUSUAL RASH Items with * indicate a potential emergency and should be followed up as soon as possible.  Feel free to call the clinic should you have any questions or concerns. The clinic phone number is [phone].  Please show the CHEMO ALERT CARD at check-in to the Emergency Department and triage nurse.  

## 2020-08-28 NOTE — Assessment & Plan Note (Signed)
She has intermittent elevated blood pressure However, her blood pressure at home is satisfactory She is instructed to check her blood pressure twice daily and I plan to see her again next week for further follow-up

## 2020-08-29 LAB — T4: T4, Total: 7.2 ug/dL (ref 4.5–12.0)

## 2020-08-29 IMAGING — DX PORTABLE CHEST - 1 VIEW
1 series · 1 of 1 positions shown · non-contrast
Comparison: Single-view of the chest 12/08/2018 and 12/07/2018.

CLINICAL DATA: Patient status post right thoracoscopy 12/07/2018 4
right lower lobe superior segmentectomy and wedge resection of the
right upper lobe.

EXAM:
PORTABLE CHEST 1 VIEW

[chest ap]
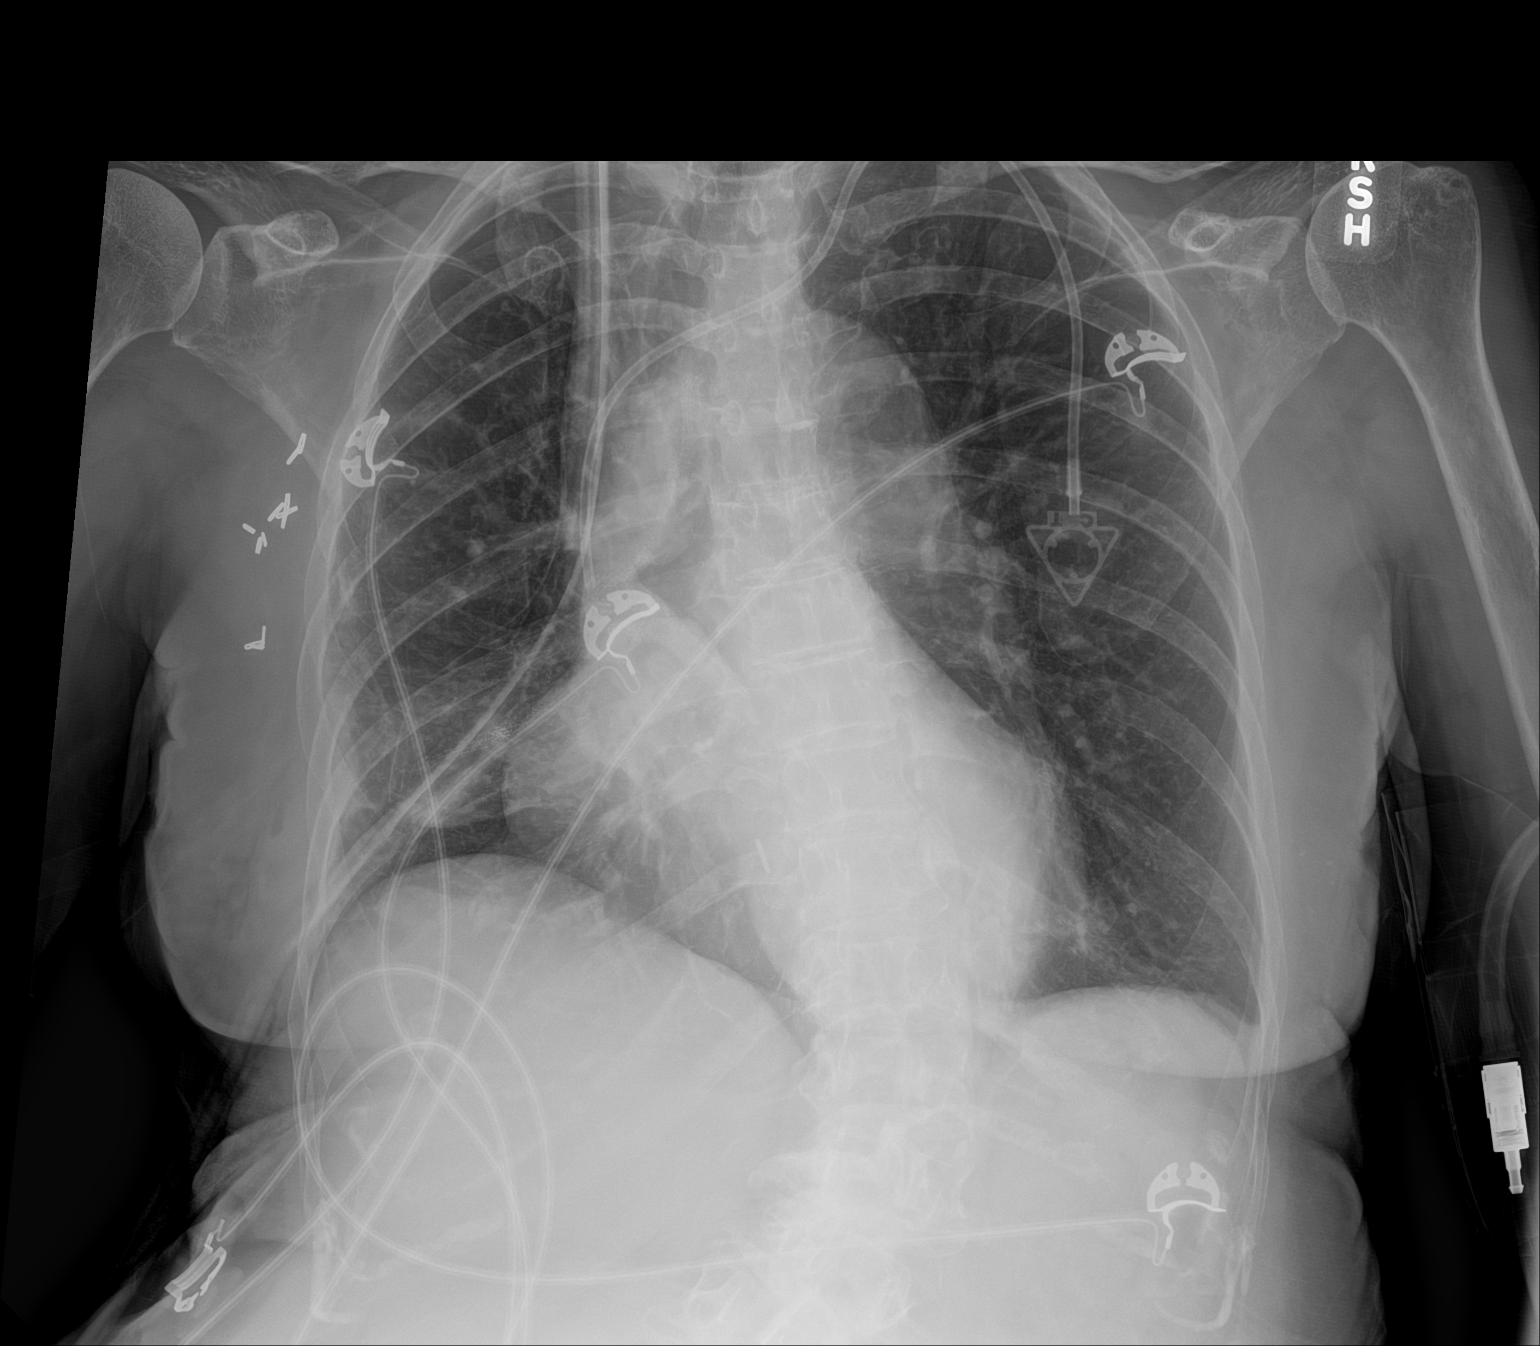

[1 of 1 positions shown; findings below may reference images not displayed]

FINDINGS: Port-A-Cath and right chest tube remain in place. Small right apical
pneumothorax estimated at 10% is new since yesterday's examination.
The left lung is expanded and clear. Volume loss in the right chest
and mild right basilar atelectasis noted. Heart size is enlarged.
Aortic atherosclerosis.
IMPRESSION: Small right pneumothorax estimated at 10% is new since yesterday's
examination. No other change.

## 2020-09-18 ENCOUNTER — Other Ambulatory Visit: Payer: Self-pay

## 2020-09-18 ENCOUNTER — Inpatient Hospital Stay: Payer: Medicare Other

## 2020-09-18 ENCOUNTER — Inpatient Hospital Stay (HOSPITAL_BASED_OUTPATIENT_CLINIC_OR_DEPARTMENT_OTHER): Payer: Medicare Other | Admitting: Hematology and Oncology

## 2020-09-18 ENCOUNTER — Encounter: Payer: Self-pay | Admitting: Hematology and Oncology

## 2020-09-18 ENCOUNTER — Inpatient Hospital Stay: Payer: Medicare Other | Attending: Hematology and Oncology

## 2020-09-18 DIAGNOSIS — C541 Malignant neoplasm of endometrium: Secondary | ICD-10-CM | POA: Insufficient documentation

## 2020-09-18 DIAGNOSIS — C3431 Malignant neoplasm of lower lobe, right bronchus or lung: Secondary | ICD-10-CM | POA: Insufficient documentation

## 2020-09-18 DIAGNOSIS — C7951 Secondary malignant neoplasm of bone: Secondary | ICD-10-CM | POA: Diagnosis not present

## 2020-09-18 DIAGNOSIS — Z5112 Encounter for antineoplastic immunotherapy: Secondary | ICD-10-CM | POA: Diagnosis not present

## 2020-09-18 DIAGNOSIS — I10 Essential (primary) hypertension: Secondary | ICD-10-CM

## 2020-09-18 DIAGNOSIS — Z7189 Other specified counseling: Secondary | ICD-10-CM

## 2020-09-18 DIAGNOSIS — I700 Atherosclerosis of aorta: Secondary | ICD-10-CM | POA: Insufficient documentation

## 2020-09-18 DIAGNOSIS — R600 Localized edema: Secondary | ICD-10-CM

## 2020-09-18 DIAGNOSIS — R809 Proteinuria, unspecified: Secondary | ICD-10-CM | POA: Insufficient documentation

## 2020-09-18 DIAGNOSIS — Z79899 Other long term (current) drug therapy: Secondary | ICD-10-CM | POA: Diagnosis not present

## 2020-09-18 DIAGNOSIS — I2510 Atherosclerotic heart disease of native coronary artery without angina pectoris: Secondary | ICD-10-CM | POA: Insufficient documentation

## 2020-09-18 DIAGNOSIS — C549 Malignant neoplasm of corpus uteri, unspecified: Secondary | ICD-10-CM

## 2020-09-18 LAB — CMP (CANCER CENTER ONLY)
ALT: 17 U/L (ref 0–44)
AST: 28 U/L (ref 15–41)
Albumin: 3.6 g/dL (ref 3.5–5.0)
Alkaline Phosphatase: 57 U/L (ref 38–126)
Anion gap: 15 (ref 5–15)
BUN: 23 mg/dL (ref 8–23)
CO2: 26 mmol/L (ref 22–32)
Calcium: 9.9 mg/dL (ref 8.9–10.3)
Chloride: 104 mmol/L (ref 98–111)
Creatinine: 1 mg/dL (ref 0.44–1.00)
GFR, Estimated: 57 mL/min — ABNORMAL LOW (ref >60)
Glucose, Bld: 117 mg/dL — ABNORMAL HIGH (ref 70–99)
Potassium: 3.6 mmol/L (ref 3.5–5.1)
Sodium: 145 mmol/L (ref 135–145)
Total Bilirubin: 0.6 mg/dL (ref 0.3–1.2)
Total Protein: 7.2 g/dL (ref 6.5–8.1)

## 2020-09-18 LAB — CBC WITH DIFFERENTIAL (CANCER CENTER ONLY)
Abs Immature Granulocytes: 0.03 10*3/uL (ref 0.00–0.07)
Basophils Absolute: 0 10*3/uL (ref 0.0–0.1)
Basophils Relative: 1 %
Eosinophils Absolute: 0.3 10*3/uL (ref 0.0–0.5)
Eosinophils Relative: 5 %
HCT: 39.2 % (ref 36.0–46.0)
Hemoglobin: 12.6 g/dL (ref 12.0–15.0)
Immature Granulocytes: 1 %
Lymphocytes Relative: 14 %
Lymphs Abs: 0.9 10*3/uL (ref 0.7–4.0)
MCH: 31.5 pg (ref 26.0–34.0)
MCHC: 32.1 g/dL (ref 30.0–36.0)
MCV: 98 fL (ref 80.0–100.0)
Monocytes Absolute: 1 10*3/uL (ref 0.1–1.0)
Monocytes Relative: 15 %
Neutro Abs: 4.3 10*3/uL (ref 1.7–7.7)
Neutrophils Relative %: 64 %
Platelet Count: 161 10*3/uL (ref 150–400)
RBC: 4 MIL/uL (ref 3.87–5.11)
RDW: 14.6 % (ref 11.5–15.5)
WBC Count: 6.6 10*3/uL (ref 4.0–10.5)
nRBC: 0 % (ref 0.0–0.2)

## 2020-09-18 LAB — TOTAL PROTEIN, URINE DIPSTICK: Protein, ur: 30 mg/dL — AB

## 2020-09-18 LAB — TSH: TSH: 4.074 u[IU]/mL — ABNORMAL HIGH (ref 0.308–3.960)

## 2020-09-18 MED ORDER — SODIUM CHLORIDE 0.9% FLUSH
10.0000 mL | INTRAVENOUS | Status: DC | PRN
  Administered 2020-09-18: 10 mL
  Filled 2020-09-18: qty 10

## 2020-09-18 MED ORDER — SODIUM CHLORIDE 0.9 % IV SOLN
Freq: Once | INTRAVENOUS | Status: AC
Start: 2020-09-18 — End: 2020-09-18
  Filled 2020-09-18: qty 250

## 2020-09-18 MED ORDER — SODIUM CHLORIDE 0.9 % IV SOLN
200.0000 mg | Freq: Once | INTRAVENOUS | Status: AC
  Administered 2020-09-18: 200 mg via INTRAVENOUS
  Filled 2020-09-18: qty 8

## 2020-09-18 MED ORDER — HEPARIN SOD (PORK) LOCK FLUSH 100 UNIT/ML IV SOLN
500.0000 [IU] | Freq: Once | INTRAVENOUS | Status: AC | PRN
  Administered 2020-09-18: 500 [IU]
  Filled 2020-09-18: qty 5

## 2020-09-18 NOTE — Patient Instructions (Signed)
[facility] Discharge Instructions for Patients Receiving Chemotherapy  Today you received the following chemotherapy agents :  Keytruda.  To help prevent nausea and vomiting after your treatment, we encourage you to take your nausea medication as prescribed.   If you develop nausea and vomiting that is not controlled by your nausea medication, call the clinic.   BELOW ARE SYMPTOMS THAT SHOULD BE REPORTED IMMEDIATELY:  *FEVER GREATER THAN 100.5 F  *CHILLS WITH OR WITHOUT FEVER  NAUSEA AND VOMITING THAT IS NOT CONTROLLED WITH YOUR NAUSEA MEDICATION  *UNUSUAL SHORTNESS OF BREATH  *UNUSUAL BRUISING OR BLEEDING  TENDERNESS IN MOUTH AND THROAT WITH OR WITHOUT PRESENCE OF ULCERS  *URINARY PROBLEMS  *BOWEL PROBLEMS  UNUSUAL RASH Items with * indicate a potential emergency and should be followed up as soon as possible.  Feel free to call the clinic should you have any questions or concerns. The clinic phone number is [phone].  Please show the CHEMO ALERT CARD at check-in to the Emergency Department and triage nurse.  

## 2020-09-18 NOTE — Assessment & Plan Note (Signed)
She is started to develop proteinuria Her blood pressure is slightly trending up Thankfully, she is not symptomatic Will contact her primary care doctor to manage her blood pressure aggressively while on treatment If her systolic blood pressure remain high by next week, we might have to hold her Lenvima

## 2020-09-18 NOTE — Assessment & Plan Note (Signed)
Her lower extremity edema is slightly worse She will continue furosemide as directed by her primary care doctor Her serum albumin is satisfactory

## 2020-09-18 NOTE — Assessment & Plan Note (Signed)
We will proceed with pembrolizumab and Lenvima today Unfortunately, she is starting to develop proteinuria and uncontrolled hypertension again We will proceed with pembrolizumab I will enlist the help from her primary care doctor to manage her blood pressure She is instructed to continue blood pressure monitoring twice daily at home

## 2020-09-18 NOTE — Progress Notes (Signed)
[facility] OFFICE PROGRESS NOTE  Patient Care Team: Shon Baton, MD as PCP - General (Internal Medicine) Heath Lark, MD as Consulting Physician (Hematology and Oncology)  ASSESSMENT & PLAN:  Endometrial cancer [facility]) We will proceed with pembrolizumab and Lenvima today Unfortunately, she is starting to develop proteinuria and uncontrolled hypertension again We will proceed with pembrolizumab I will enlist the help from her primary care doctor to manage her blood pressure She is instructed to continue blood pressure monitoring twice daily at home  Bilateral edema of lower extremity Her lower extremity edema is slightly worse She will continue furosemide as directed by her primary care doctor Her serum albumin is satisfactory  Essential hypertension She is started to develop proteinuria Her blood pressure is slightly trending up Thankfully, she is not symptomatic Will contact her primary care doctor to manage her blood pressure aggressively while on treatment If her systolic blood pressure remain high by next week, we might have to hold her Lenvima   No orders of the defined types were placed in this encounter.   All questions were answered. The patient knows to call the clinic with any problems, questions or concerns. The total time spent in the appointment was 20 minutes encounter with patients including review of chart and various tests results, discussions about plan of care and coordination of care plan   Heath Lark, MD 09/18/2020 10:19 AM  INTERVAL HISTORY: Please see below for problem oriented charting. She returns with her husband for further follow-up She did not bring her book that documented blood pressure over the past few weeks She recall occasional blood pressure around 140 She denies headache or changes in her vision Lower extremity edema is stable  SUMMARY OF ONCOLOGIC HISTORY: Oncology History Overview Note  Biopsy of recurrence 06-2014 ER  negative. PDL1 testing low at 2% on the uterine cancer She progressed on carboplatin and Paclitaxel for uterine cancer HER 2 negative on pathology [id number] Negative genetic testing  On her lung cancer sample, [facility] testing revealed 10% PD-1 positive EGFR mutation was detected, exon 19 deletion   Endometrial cancer ([place])  12/01/2007 Initial Diagnosis   She has recurrent papillary serous endometrial carcinoma. Briefly she was diagnosed in 2009 with a FIGO IB pappilary serous carcinoma treated with hysterectomy followed by adjuvant chemo and vaginal brachytherapy. She then had a recurrence diagnosed in late 2012 that was deemed not to be surgically resectable, and was treated with 6 cycles of carbo/taxol followed by 5040 cGy of EBRT to the pelvic mass. She was then followed with serial imaging and was noted in June of this year to have slight increase in the size of the mass, and again in September there was small enlargement of the mass. She had a re-staging PET scan on 03/10/13 that showed FDG activity in the pelvic mass, with no other areas of disease   12/01/2007 Imaging   CT scan of chest, abdomen and pelvis: 1.  Mass like area of decreased attenuation in the central uterus, consistent with the known endometrial carcinoma.  Deep myometrial invasion is suspected.  Pelvic MRI without and with contrast could be performed for further staging workup if clinically warranted. 2.  No evidence of extrauterine extension or lymphadenopathy. 3.  Sigmoid diverticulosis incidentally noted.   12/15/2007 Surgery   --12/2007 laparoscopic hysterectomy and PLND --05/2008 complted 6 cycles adjuvant carbo/taxol followed by vaginal cuff brachy --03/2011 exploratory lararoscopy, ureterolysis --4/13 completed 6 cycels carbotaxol --8/13 completed EBRT to the left pelvic sidewall  06/10/2008 Imaging   CT scan abdomen and pelvis 1.  Nonspecific mildly prominent inguinal lymph nodes, right greater than  left. 2.  Interval hysterectomy without evidence of recurrent pelvic mass or fluid collection.   03/22/2011 Imaging   Ct scan abdomen and pelvis 1.  Local uterine cancer recurrence with two large nodes along the left pelvic sidewall. 2.  No evidence of bowel obstruction, urinary obstruction, or more distant metastasis.   03/31/2011 Relapse/Recurrence   chemotherapy and external beam radiation   05/16/2011 - 09/17/2011 Chemotherapy   She had 6 cycles of carboplatin and Taxol    07/17/2011 Imaging   Ct scan abdomen and pelvis 1.  Interval improvement in the previously demonstrated left pelvic local recurrence of tumor. 2.  No disease progression or complication identified. 3.  Mild bladder wall thickening on the right, nonspecific and possibly related to incomplete distension and/or radiation therapy. 4.  Cholelithiasis   10/11/2011 Imaging   CT scan abdomen and pelvis 1.  Interval enlargement of the left pelvic sidewall masses. 2.  No new nodular disease in the pelvis or lymphadenopathy. 3.  No evidence  of distant metastasis. 4.  Mild hydronephrosis on the right is similar to prior. 5.  Focal thickening of the right aspect the bladder is stable compared to prior.    10/28/2011 - 12/05/2011 Radiation Therapy   radiation for pelvic sidewall recurrence   10/28/2011 - 12/05/2011 Radiation Therapy   10/28/11-12/05/11: Radiotherapy to the left pelvic sidewall   03/18/2012 Imaging   CT abdomen 1.  Today's study demonstrates a positive response to therapy with decreased size of left pelvic sidewall nodal masses, as detailed above.  No new soft tissue masses or new lymphadenopathy is identified within the abdomen or pelvis. Continue attention on follow-up studies is recommended. 2.  Cholelithiasis without findings to suggest acute cholecystitis. 3.  Colonic diverticulosis without findings to suggest acute diverticulitis at this time. 4.  Mild asymmetric urinary bladder wall thickening is  unchanged compared to prior examinations and is nonspecific.  No definite bladder wall mass is identified at this time. 5.  Additional findings, similar to prior examinations, as above   06/22/2012 Imaging   CT abdomen 1.  Further decreased size of the left pelvic peripherally enhancing lesion. 2.  No new sites of new or progressive disease. 3. Esophageal air fluid level suggests dysmotility or gastroesophageal reflux. 4.  Cholelithiasis   11/02/2012 Imaging   CT abdomen 1.  The left pelvic side wall lesion demonstrates mild increase in size from previous exam. 2.  No new sites of new or progressive disease. 3.  Cholelithiasis.    01/28/2013 Imaging   CT abdomen 1. Interval small increase in volume of enhancing mass adjacent to the left pelvic sidewall. 2. No evidence of abdominal or pelvic lymphadenopathy   04/02/2013 Surgery   pelvic mass resection with IORT   04/02/2013 - 04/02/2013 Radiation Therapy   04/02/13: Intraoperative radiotherapy to the left pelvis   05/10/2013 PET scan   1. Hypermetabolic mass in the deep left pelvis consistent with metastasis. 2. No additional evidence of local metastasis. No evidence of distant metastasis. 3. Small right lower lobe pulmonary nodule is not hypermetabolic.   07/27/2013 Imaging   No evidence of recurrent or metastatic disease. Apparent prior resection of the deep left pelvic metastasis.   02/08/2014 Imaging   No CT findings for recurrent or metastatic disease involving the abdomen/ pelvis   06/13/2014 Relapse/Recurrence   + recurrence paraspinous muscle   06/21/2014  Procedure   There is a soft tissue lesion along the left side of L3-L4. This lesion roughly measures up to 2.2 cm. Needle was positioned along the posterior aspect of the lesion. Small amount of air in the paraspinal tissues following needle removal   06/21/2014 Pathology Results   Bone, biopsy, left lumbar paraspinal - METASTATIC POORLY DIFFERENTIATED CARCINOMA,  CONSISTENT WITH HIGH GRADE SEROUS CARCINOMA SEE COMMENT. Microscopic Comment The carcinoma demonstrates the following immunophenotype: Cytokeratin 7 - patchy moderate to strong expression Estrogen receptor - negative expression P53 - strong diffuse expression TTF-1 - negative expression WT-1 - negative expression CD56 - focal moderate strong expression Synaptophysin - negative expression GCDFP - negative expression The history of primary endometrial papillary serous carcinoma and primary mammary carcinoma is noted. In the current case, the overall morphologic and immunophenotype are that of poorly differentiated carcinoma, consistent with high grade serous carcinoma.    07/11/2014 - 08/12/2014 Radiation Therapy   07/11/14-08/12/14: 55 Gy in 25 fractions to the Lumbar spine   08/15/2014 Imaging   CT scan of chest, abdomen and pelvis 1. Since the biopsy study of 06/21/2014, similar to slight decrease in size of a left paraspinous lesion at L3-4. 2. No new sites of metastatic disease identified. 3. 8 mm ground-glass nodule in the right lower lobe is grossly similar to 03/10/2013, suggesting a benign etiology. 4. Cholelithiasis. 5. Apparent sigmoid colonic wall thickening which could be due to underdistention. Colitis felt less likely. 6.  Atherosclerosis, including within the coronary arteries. 7. Pelvic floor laxity.   10/07/2014 Imaging   CT scan of chest, abdomen and pelvis: Stable to slight interval decrease in size of left paraspinous lesion at L3-4. No new sites of metastatic disease. Unchanged ground-glass nodule within the right lower lobe, potentially benign in etiology. Recommend attention on followup. Cholelithiasis. Sigmoid colonic diverticulosis. No CT evidence to suggest acute diverticulitis.   01/19/2015 Imaging   Ct scan of abdomen and pelvis 1. Decrease in size of left paraspinous metastasis. 2. No new sites of disease. 3. Stable ground-glass attenuating nodule within the  superior segment of right lower lobe. 4. Gallstones   05/02/2015 Imaging   Ct abdomen and pelvis: Slight interval increase in size of left lumbar paraspinal soft tissue metastasis. No new sites of metastatic disease identified within the abdomen or pelvis.    05/11/2015 PET scan   1. The small soft tissue mass just lateral to the left L3 for neural foramen is hypermetabolic, favoring malignancy. 2. There is also a small focus of hypermetabolic activity between the left L1 and L2 transverse processes, but without a CT correlate. 3. The 13 mm sub solid nodule in the superior segment right lower lobe is stable from recent exams but has slowly increased in size over the last 7 years. Although not hypermetabolic, the appearance is concerning for the possibility of a low grade adenocarcinoma. 4. Other imaging findings of potential clinical significance: Chronic bilateral maxillary sinusitis. Coronary, aortic arch, and branch vessel atherosclerotic vascular disease. Aortoiliac atherosclerotic vascular disease. Cholelithiasis. Sigmoid colon diverticulosis. Lumbar scoliosis.   07/11/2015 Imaging   Ct scan of chest, abdomen and pelvis: 1. 13 mm mixed solid and sub solid nodule in the posterior right lower lobe was present in 2009 and has clearly progressed in the interval since that study. Imaging features remain highly concerning for low-grade or well differentiated adenocarcinoma. 2. Slight increase size of in the hypermetabolic, rim enhancing nodule identified adjacent to the left L3-4 neural foramen. Metastatic disease remains a  concern. 3. No evidence for discrete soft tissue lesion between the left L1 and 2 transverse processes, the site of focal FDG uptake on the recent PET-CT. 4. Cholelithiasis. 5. Abdominal aortic atherosclerosis   08/03/2015 - 01/16/2016 Chemotherapy   She received carboplatin and Taxol   11/06/2015 PET scan   1. Hypermetabolic left X1-0 paraspinous metastasis (biopsy-proven) is  stable in size and slightly decreased in metabolism. 2. Previously described small focus of hypermetabolism between the left L2 and L3 spinous processes has resolved. 3. New mild linear hypermetabolism to the left of the T11-12 spinous processes without discrete mass on the CT images, favor benign activity related activity, recommend attention on follow-up PET-CT.  4. No definite new sites of hypermetabolic metastatic disease. No recurrent hypermetabolic metastatic disease in the pelvis. 5. Interval stability of subsolid 1.3 cm superior segment right lower lobe pulmonary nodule without associated significant metabolism, which has grown compared to the 2009 chest CT study, and remain suspicious for low grade adenocarcinoma. 6. Additional findings include aortic atherosclerosis, coronary atherosclerosis, mild multinodular goiter with no hypermetabolic thyroid nodules, cholelithiasis and moderate sigmoid diverticulosis.   02/12/2016 PET scan   1. Interval increase in size and metabolic activity of the LEFT paraspinal soft tissue metastasis. 2. New activity within the musculature of the LEFT chest wall. Given the unusual location of the paraspinal metastasis cannot exclude a second soft tissue metastasis to the musculature however favor benign physiologic activity. 3. Stable RIGHT lower lobe pulmonary nodule. 4. No evidence of local recurrence.     04/08/2016 Imaging   Ct scan of chest, abdomen and pelvis: 1. Similar size of a  left paravertebral abdominal lesion. 2. No new or progressive metastatic disease. 3. No correlate for the muscular activity about the lateral left chest wall. 4.  Coronary artery atherosclerosis. Aortic atherosclerosis. 5. Cholelithiasis. 6. Similar right lower lobe pulmonary nodule.   06/03/2016 Imaging   CT abdomen and pelvis 1. Similar to mild enlargement of a paravertebral soft tissue lesion at the L3-4 level. 2. No new sites of disease identified. 3.  Cholelithiasis. 4. Hysterectomy. 5.  Aortic atherosclerosis.    09/02/2016 Imaging   CT abdomen and pelvis 1. Continue mild interval increase in size of LEFT paraspinal mass (1-2 mm). 2. No evidence of new metastatic disease in the abdomen pelvis. 3. Post hysterectomy anatomy   09/26/2016 Imaging   MR lumbar spine 1. Left paraspinal metastasis with epicenter adjacent to the left L3 neural foramen does extend into the foramen to the level of the left lateral epidural space (series 9, image 31), but also tracks cephalad and caudal along the L2-L4 lumbar plexus (series 10, image 15). No extension into the left L2 or L4 neural foramina. And no more distal extension of tumor. 2. Abnormal signal in the medial left psoas and ventral left erector spinae muscles at L2 and L3 is probably denervation related. 3. No bone invasion or osseous metastatic disease. Suspect previous radiation of the L2 through L5 spinal levels. 4. No dural or intradural metastatic disease.    10/22/2016 Procedure   She underwent stereotactic Radiosurgery   10/30/2016 Genetic Testing   Patient has genetic testing done for Inheritable genetic mutation panel. Results revealed patient has no actionable mutation.   01/21/2017 Imaging   1. Reduced size and conspicuity of the left paraspinal mass at the L3-4 level. The mass still present but has enhancement similar to that of adjacent psoas musculature. 2. No new metastatic disease is identified. 3. Other imaging findings  of potential clinical significance: Cholelithiasis. Aortic Atherosclerosis (ICD10-I70.0). Sigmoid diverticulosis. Lumbar scoliosis.   05/09/2017 Imaging   Ct scan of abdomen and pelvis 1. Paraspinal mass adjacent to the left side of L3-L4 is less distinct than prior examinations, and is therefore difficult to discretely measure, however, the overall appearance suggests continued positive response to therapy. 2. No new signs of metastatic disease elsewhere in  the abdomen or pelvis. 3. Aortic atherosclerosis, in addition to at least right coronary artery disease. Assessment for potential risk factor modification, dietary therapy or pharmacologic therapy may be warranted, if clinically indicated. 4. Colonic diverticulosis without evidence of acute diverticulitis at this time. 5. Additional incidental findings, as above. Aortic Atherosclerosis (ICD10-I70.0).   08/14/2017 Imaging   1. Treated left paraspinal mass centered at L3-4. Mass size is stable but there has been some evolution of tumor characteristics with less central fluid seen today. Recommend continued surveillance. 2. No evidence of untreated metastasis. 3. Degenerative changes and related impingement are described above.   10/28/2017 Imaging   Stable small left paraspinal soft tissue mass. No new or progressive disease within the abdomen or pelvis.  Cholelithiasis.  No radiographic evidence of cholecystitis.  Colonic diverticulosis, without radiographic evidence of diverticulitis.   04/30/2018 Imaging   Stable post treatment change of the partially necrotic LEFT paravertebral mass at L3-L4. 18 x 21 mm cross-section. Mass effect on the LEFT L3 nerve root redemonstrated. Enhancing and atrophic LEFT psoas is inseparable.   10/15/2018 Imaging   1. Stable post treatment size and appearance of partially necrotic left paravertebral mass at L3-4, measuring 19 x 25 mm on current exam (unchanged when measured at similar level on previous study). Mass effect on the adjacent left L3 nerve root is unchanged. Adjacent enhancing and atrophic left psoas muscle. 2. Left convex scoliosis with associated multilevel degenerative spondylolysis and facet arthrosis, unchanged   10/29/2018 Imaging   Stable small left L3 paraspinal soft tissue mass. No evidence of new or progressive metastatic disease, or other acute findings.   Cholelithiasis.  No radiographic evidence of cholecystitis.   Colonic  diverticulosis, without radiographic evidence of diverticulitis.   11/11/2018 PET scan   1. Low level metabolism (max SUV 2.0) associated with the irregular subsolid superior segment right lower lobe 2.1 cm pulmonary nodule. As better depicted on the recent diagnostic chest CT study, this nodule crosses the major fissure into the right upper lobe and has increased in size and density on multiple imaging studies back to 2009. Findings are most suggestive of primary bronchogenic adenocarcinoma. 2. No hypermetabolic thoracic adenopathy. 3. Persistent hypermetabolism (max SUV 8.0) associated with the known left L3-4 paraspinous metastasis, mildly decreased in metabolism since 02/12/2016 PET-CT. 4. No additional sites of hypermetabolic metastatic disease in the abdomen or pelvis. No metabolic evidence of peritoneal recurrence. No ascites. 5. Chronic findings include: Aortic Atherosclerosis (ICD10-I70.0). Coronary atherosclerosis. Chronic bilateral maxillary sinusitis. Cholelithiasis. Moderate sigmoid diverticulosis.   01/07/2019 - 04/04/2019 Chemotherapy   The patient had carboplatin and Alimta for chemotherapy treatment.     05/03/2019 -  Chemotherapy   The patient had Pembrolizumab and Lenvima for chemotherapy treatment.     07/23/2019 PET scan   1. 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 No new sites of hypermetabolic metastatic disease on today's study. 2. Stable appearance of surgical changes in the right lung without features to suggest local recurrence or metastatic lung cancer. 3. Cholelithiasis. 4.  Aortic Atherosclerois (ICD10-170.0)   10/20/2019 Imaging  Bilateral venous Doppler US RIGHT:  - Findings consistent with acute and occlusive deep vein thrombosis involving the right common femoral vein, right external iliac vein, right femoral vein, right popliteal vein, right posterior tibial veins, and right peroneal veins.   Unable to  adequately visualize the common iliac veins due to bowel gas. The imaged portions of the IVC were patent.     LEFT:  - No evidence of common femoral vein obstruction.    11/08/2019 PET scan   1. Further reduction in activity at the left L3 level and left paraspinal tissues at L3. Maximum vertebral SUV currently 3.9, previously 4.8 on 07/23/2019 and previously 10.0 on 04/14/2019. 2. Late phase healing of previous left transverse process fractures at L3 and L4. Suspected healing right sixth rib fracture anteriorly. 3. There is new focal activity in the vicinity of the right internal jugular vein in the lower neck. Maximum SUV in this vicinity is 5.1. I not see a well-defined lymph node are thyroid lesion to corroborate with the focus, but surveillance in this region is suggested. 4. Other imaging findings of potential clinical significance: Aortic Atherosclerosis (ICD10-I70.0). Coronary atherosclerosis. Cholelithiasis. Sigmoid colon diverticulosis.   08/16/2020 PET scan   Signs of nodal recurrence in the LEFT pelvis as described.   Increased metabolic activity at L3 without discrete visible lesion, subtle sclerosis in this area seen in the context of degenerative change. But seen also at the site of previous disease suspicious for disease recurrence and with metabolic activity considerably increased compared to previous imaging.   Signs of partial lung resection in the RIGHT chest as before.   Muscular activity in the bilateral abductor compartment of the upper thigh RIGHT greater than LEFT felt to be physiologic. Correlate with any symptoms in this location.   Aortic atherosclerosis.   08/28/2020 -  Chemotherapy    Patient is on Treatment Plan: UTERINE LENVATINIB + PEMBROLIZUMAB Q21D      Metastatic cancer to bone ([place])  06/27/2014 Initial Diagnosis   Metastatic cancer to bone   Primary lung cancer, right ([place])  11/04/2018 Initial Diagnosis   Primary lung cancer, right ([place])   11/11/2018  PET scan   1. Low level metabolism (max SUV 2.0) associated with the irregular subsolid superior segment right lower lobe 2.1 cm pulmonary nodule. As better depicted on the recent diagnostic chest CT study, this nodule crosses the major fissure into the right upper lobe and has increased in size and density on multiple imaging studies back to 2009. Findings are most suggestive of primary bronchogenic adenocarcinoma. 2. No hypermetabolic thoracic adenopathy. 3. Persistent hypermetabolism (max SUV 8.0) associated with the known left L3-4 paraspinous metastasis, mildly decreased in metabolism since 02/12/2016 PET-CT. 4. No additional sites of hypermetabolic metastatic disease in the abdomen or pelvis. No metabolic evidence of peritoneal recurrence. No ascites. 5. Chronic findings include: Aortic Atherosclerosis (ICD10-I70.0). Coronary atherosclerosis. Chronic bilateral maxillary sinusitis. Cholelithiasis. Moderate sigmoid diverticulosis.   12/07/2018 Pathology Results   1. Lung, resection (segmental or lobe), Right Lobe Superior with endblock wedge of right upper lobe - INVASIVE ADENOCARCINOMA, MODERATELY DIFFERENTIATED, SPANNING 1.6 CM. - THE SURGICAL RESECTION MARGINS ARE NEGATIVE FOR CARCINOMA. - SEE ONCOLOGY TABLE BELOW. 2. Lymph node, biopsy, Level 9 #1 - THERE IS NO EVIDENCE OF CARCINOMA IN 1 OF 1 LYMPH NODE (0/1). 3. Lymph node, biopsy, Level 9 #2 - THERE IS NO EVIDENCE OF CARCINOMA IN 1 OF 1 LYMPH NODE (0/1). 4. Lymph node, biopsy, Level 7 #1 - METASTATIC CARCINOMA IN  1 OF 1 LYMPH NODE (1/1). 5. Lymph node, biopsy, Level 7 #2 - THERE IS NO EVIDENCE OF CARCINOMA IN 1 OF 1 LYMPH NODE (0/1). 6. Lymph node, biopsy, Level 12 #1 - THERE IS NO EVIDENCE OF CARCINOMA IN 1 OF 1 LYMPH NODE (0/1). 7. Lymph node, biopsy, Level 12 #2 - THERE IS NO EVIDENCE OF CARCINOMA IN 1 OF 1 LYMPH NODE (0/1). 8. Lymph node, biopsy, Level 10 #1 - THERE IS NO EVIDENCE OF CARCINOMA IN 1 OF 1 LYMPH NODE (0/1). 9.  Lymph node, biopsy, Level 4R #1 - THERE IS NO EVIDENCE OF CARCINOMA IN 1 OF 1 LYMPH NODE (0/1). 10. Lymph node, biopsy, Level 4R #2 - THERE IS NO EVIDENCE OF CARCINOMA IN 1 OF 1 LYMPH NODE (0/1). 11. Lymph node, biopsy, Level 2R #1 - THERE IS NO EVIDENCE OF CARCINOMA IN 1 OF 1 LYMPH NODE (0/1). 12. Lung, resection (segmental or lobe), Right Lobe Superior - BENIGN LUNG PARENCHYMA. - THERE IS NO EVIDENCE OF MALIGNANCY. Procedure: Segmentectomy. Specimen Laterality: Right. Tumor Site: Right superior lobe. Tumor Size: 1.6 cm (gross measurement). Tumor Focality: Unifocal. Histologic Type: Adenocarcinoma, moderately differentiated. Visceral Pleura Invasion: Not identified. Lymphovascular Invasion: Not identified. Direct Invasion of Adjacent Structures: Not identified. Margins: Negative for carcinoma. Treatment Effect: N/A Regional Lymph Nodes: Number of Lymph Nodes Involved: 1 (level 7) Number of Lymph Nodes Examined: 10 Pathologic Stage Classification (pTNM, AJCC 8th Edition): pT1b, pN2 Ancillary Studies: The tumor cells are positive for Napsin-A, TTF-1, and p53. They are essentially negative for estrogen receptor, PAX-8 and progesterone receptor. Additional studies can be performed upon clinician request. Representative Tumor Block: 1D-1E. (JBK:gt, 12/09/18)   12/07/2018 Surgery   PREOPERATIVE DIAGNOSIS:  Right lung nodule.   POSTOPERATIVE DIAGNOSIS:  Non-small cell carcinoma- suspected primary lung carcinoma, clinical stage T2N01b.   PROCEDURE:   Right video-assisted thoracoscopy, Right lower lobe superior segmentectomy with en bloc wedge resection of right upper lobe, Lymph node dissection, Intercostal nerve block levels 3-9.   SURGEON:  Modesto Charon, MD   FINDINGS:  Mass palpable in posterior aspect of superior segment of right lower lobe, likely involvement across fissure into the upper lobe.  Frozen section revealed non-small cell carcinoma. upper and lower lobe margins  clear.   12/23/2018 Cancer Staging   Staging form: Lung, AJCC 8th Edition - Pathologic: Stage IIIA (pT1b, pN2, cM0) - Signed by Heath Lark, MD on 12/23/2018   01/05/2019 Imaging   MRI brain 1. No metastatic disease or acute intracranial abnormality identified. 2. Small chronic parafalcine meningioma size is stable since that described in 2006 (12 x 15 mm). 3. Advanced signal changes in the cerebral white matter and to a lesser extent pons, nonspecific but most commonly due to chronic small vessel disease. 4. Partially visible degenerative cervical spinal stenosis.     01/07/2019 -  Chemotherapy   The patient had carboplatin and Alimta for chemotherapy treatment.     04/14/2019 PET scan   1. 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 As shown on recent MRI of 04/01/2019, there is a new component of invasion into the left lower L3 vertebral body. This new component has a maximum SUV of 10.0, compatible with active malignancy. 2. Interval wedge resection of portions of the right upper lobe and right lower lobe at the site of the prior nodule. There is only minimal low-grade activity along the wedge resection clips, maximum SUV of 1.5. Surveillance is likely warranted. No new nodule identified. 3. Other  imaging findings of potential clinical significance: Aortic Atherosclerosis (ICD10-I70.0). Coronary atherosclerosis. Thoracolumbar scoliosis. Cholelithiasis. Sigmoid colon diverticulosis.     05/03/2019 -  Chemotherapy   The patient had Pembrolizumab and Lenvima for chemotherapy treatment.     05/22/2019 Imaging   Ct head Atrophy, chronic microvascular disease.   No acute intracranial abnormality.     03/09/2020 PET scan   1. No evidence of malignant disease progression. 2. Similar mild metabolic activity within at the sclerotic L3 vertebral body lesion. Reduction in paraspinal muscular metabolic activity at L3 vertebral body level.     REVIEW OF SYSTEMS:    Constitutional: Denies fevers, chills or abnormal weight loss Eyes: Denies blurriness of vision Ears, nose, mouth, throat, and face: Denies mucositis or sore throat Respiratory: Denies cough, dyspnea or wheezes Cardiovascular: Denies palpitation, chest discomfort  Gastrointestinal:  Denies nausea, heartburn or change in bowel habits Skin: Denies abnormal skin rashes Lymphatics: Denies new lymphadenopathy or easy bruising Neurological:Denies numbness, tingling or new weaknesses Behavioral/Psych: Mood is stable, no new changes  All other systems were reviewed with the patient and are negative.  I have reviewed the past medical history, past surgical history, social history and family history with the patient and they are unchanged from previous note.  ALLERGIES:  is allergic to ace inhibitors, dilaudid [hydromorphone hcl], and codeine.  MEDICATIONS:  Current Outpatient Medications  Medication Sig Dispense Refill  . atenolol (TENORMIN) 25 MG tablet Take 25 mg by mouth 2 (two) times daily.    . calcium carbonate (TUMS - DOSED IN MG ELEMENTAL CALCIUM) 500 MG chewable tablet Chew 1 tablet by mouth 2 (two) times daily.    . cholecalciferol (VITAMIN D3) 25 MCG (1000 UNIT) tablet Take 2,000 Units by mouth daily.    Marland Kitchen donepezil (ARICEPT) 5 MG tablet Take 1 tablet (5 mg total) by mouth at bedtime. 30 tablet 5  . furosemide (LASIX) 20 MG tablet Take 1 tablet (20 mg total) by mouth daily. 10 tablet 0  . lenvatinib 10 mg daily dose (LENVIMA) capsule Take 1 capsule (10 mg total) by mouth daily. 30 capsule 11  . lidocaine-prilocaine (EMLA) cream Apply to [facility] cath 1-2 hours prior to access as directed 30 g 1  . lidocaine-prilocaine (EMLA) cream Apply to affected area once 30 g 3  . LORazepam (ATIVAN) 0.5 MG tablet 1 tablet po 30 minutes prior to radiation or MRI 30 tablet 0  . memantine (NAMENDA) 10 MG tablet TAKE 1 TABLET BY MOUTH TWICE A DAY 180 tablet 2  . mirtazapine (REMERON) 7.5 MG tablet TAKE  1 TABLET (7.5 MG TOTAL) BY MOUTH AT BEDTIME. 90 tablet 2  . Multiple Vitamin (MULTIVITAMIN WITH MINERALS) TABS tablet Take 1 tablet by mouth daily.    . ondansetron (ZOFRAN) 8 MG tablet Take 1 tablet (8 mg total) by mouth 2 (two) times daily as needed (Nausea or vomiting). 30 tablet 1  . potassium chloride (KLOR-CON) 10 MEQ tablet Take 1 tablet (10 mEq total) by mouth daily. 10 tablet 0  . prochlorperazine (COMPAZINE) 10 MG tablet Take 1 tablet (10 mg total) by mouth every 6 (six) hours as needed (Nausea or vomiting). 30 tablet 1  . rivaroxaban (XARELTO) 10 MG TABS tablet Take 1 tablet (10 mg total) by mouth daily. 30 tablet 11  . simvastatin (ZOCOR) 20 MG tablet Take 20 mg by mouth every evening.      No current facility-administered medications for this visit.   Facility-Administered Medications Ordered in Other Visits  Medication Dose  Route Frequency Provider Last Rate Last Admin  . 0.9 %  sodium chloride infusion   Intravenous Once Alvy Bimler, Riannon Mukherjee, MD      . heparin lock flush 100 unit/mL  500 Units Intracatheter Once Alvy Bimler, Quintavius Niebuhr, MD      . heparin lock flush 100 unit/mL  500 Units Intracatheter Once PRN Alvy Bimler, Henreitta Spittler, MD      . pembrolizumab (KEYTRUDA) 200 mg in sodium chloride 0.9 % 50 mL chemo infusion  200 mg Intravenous Once Isaah Furry, MD      . sodium chloride flush (NS) 0.9 % injection 10 mL  10 mL Intracatheter Once Alvy Bimler, Reagen Haberman, MD      . sodium chloride flush (NS) 0.9 % injection 10 mL  10 mL Intracatheter PRN Alvy Bimler, Sanjuan Sawa, MD        PHYSICAL EXAMINATION: ECOG PERFORMANCE STATUS: 2 - Symptomatic, <50% confined to bed  Vitals:   09/18/20 0917  BP: (!) 162/74  Pulse: 73  Resp: 18  Temp: 98.1 F (36.7 C)  SpO2: 99%   Filed Weights   09/18/20 0917  Weight: 125 lb 12.8 oz (57.1 kg)    GENERAL:alert, no distress and comfortable SKIN: skin color, texture, turgor are normal, no rashes or significant lesions EYES: normal, Conjunctiva are pink and non-injected, sclera  clear OROPHARYNX:no exudate, no erythema and lips, buccal mucosa, and tongue normal  NECK: supple, thyroid normal size, non-tender, without nodularity LYMPH:  no palpable lymphadenopathy in the cervical, axillary or inguinal LUNGS: clear to auscultation and percussion with normal breathing effort HEART: regular rate & rhythm and no murmurs with moderate bilateral lower extremity edema ABDOMEN:abdomen soft, non-tender and normal bowel sounds Musculoskeletal:no cyanosis of digits and no clubbing  NEURO: alert & oriented x 3 with fluent speech, no focal motor/sensory deficits  LABORATORY DATA:  I have reviewed the data as listed    Component Value Date/Time   NA 145 09/18/2020 0854   NA 140 05/09/2017 0844   K 3.6 09/18/2020 0854   K 4.1 05/09/2017 0844   CL 104 09/18/2020 0854   CO2 26 09/18/2020 0854   CO2 26 05/09/2017 0844   GLUCOSE 117 (H) 09/18/2020 0854   GLUCOSE 82 05/09/2017 0844   BUN 23 09/18/2020 0854   BUN 20.7 05/09/2017 0844   CREATININE 1.00 09/18/2020 0854   CREATININE 0.7 05/09/2017 0844   CALCIUM 9.9 09/18/2020 0854   CALCIUM 9.3 05/09/2017 0844   PROT 7.2 09/18/2020 0854   PROT 6.8 05/09/2017 0844   ALBUMIN 3.6 09/18/2020 0854   ALBUMIN 3.6 05/09/2017 0844   AST 28 09/18/2020 0854   AST 27 05/09/2017 0844   ALT 17 09/18/2020 0854   ALT 17 05/09/2017 0844   ALKPHOS 57 09/18/2020 0854   ALKPHOS 43 05/09/2017 0844   BILITOT 0.6 09/18/2020 0854   BILITOT 0.81 05/09/2017 0844   GFRNONAA 57 (L) 09/18/2020 0854   GFRAA >60 02/07/2020 1109    No results found for: SPEP, UPEP  Lab Results  Component Value Date   WBC 6.6 09/18/2020   NEUTROABS 4.3 09/18/2020   HGB 12.6 09/18/2020   HCT 39.2 09/18/2020   MCV 98.0 09/18/2020   PLT 161 09/18/2020      Chemistry      Component Value Date/Time   NA 145 09/18/2020 0854   NA 140 05/09/2017 0844   K 3.6 09/18/2020 0854   K 4.1 05/09/2017 0844   CL 104 09/18/2020 0854   CO2 26 09/18/2020 0854   CO2 26  05/09/2017 0844   BUN 23 09/18/2020 0854   BUN 20.7 05/09/2017 0844   CREATININE 1.00 09/18/2020 0854   CREATININE 0.7 05/09/2017 0844      Component Value Date/Time   CALCIUM 9.9 09/18/2020 0854   CALCIUM 9.3 05/09/2017 0844   ALKPHOS 57 09/18/2020 0854   ALKPHOS 43 05/09/2017 0844   AST 28 09/18/2020 0854   AST 27 05/09/2017 0844   ALT 17 09/18/2020 0854   ALT 17 05/09/2017 0844   BILITOT 0.6 09/18/2020 0854   BILITOT 0.81 05/09/2017 0844

## 2020-09-19 DIAGNOSIS — N1831 Chronic kidney disease, stage 3a: Secondary | ICD-10-CM | POA: Diagnosis not present

## 2020-09-19 DIAGNOSIS — E46 Unspecified protein-calorie malnutrition: Secondary | ICD-10-CM | POA: Diagnosis not present

## 2020-09-19 DIAGNOSIS — I2510 Atherosclerotic heart disease of native coronary artery without angina pectoris: Secondary | ICD-10-CM | POA: Diagnosis not present

## 2020-09-19 DIAGNOSIS — C7951 Secondary malignant neoplasm of bone: Secondary | ICD-10-CM | POA: Diagnosis not present

## 2020-09-19 DIAGNOSIS — I129 Hypertensive chronic kidney disease with stage 1 through stage 4 chronic kidney disease, or unspecified chronic kidney disease: Secondary | ICD-10-CM | POA: Diagnosis not present

## 2020-09-19 DIAGNOSIS — C3431 Malignant neoplasm of lower lobe, right bronchus or lung: Secondary | ICD-10-CM | POA: Diagnosis not present

## 2020-09-19 DIAGNOSIS — Z7901 Long term (current) use of anticoagulants: Secondary | ICD-10-CM | POA: Diagnosis not present

## 2020-09-19 DIAGNOSIS — L509 Urticaria, unspecified: Secondary | ICD-10-CM | POA: Diagnosis not present

## 2020-09-19 DIAGNOSIS — C55 Malignant neoplasm of uterus, part unspecified: Secondary | ICD-10-CM | POA: Diagnosis not present

## 2020-09-19 DIAGNOSIS — C50919 Malignant neoplasm of unspecified site of unspecified female breast: Secondary | ICD-10-CM | POA: Diagnosis not present

## 2020-09-19 DIAGNOSIS — R413 Other amnesia: Secondary | ICD-10-CM | POA: Diagnosis not present

## 2020-09-19 DIAGNOSIS — I82401 Acute embolism and thrombosis of unspecified deep veins of right lower extremity: Secondary | ICD-10-CM | POA: Diagnosis not present

## 2020-09-19 LAB — T4: T4, Total: 7.6 ug/dL (ref 4.5–12.0)

## 2020-09-25 ENCOUNTER — Telehealth: Payer: Self-pay

## 2020-09-25 ENCOUNTER — Telehealth: Payer: Self-pay | Admitting: Hematology and Oncology

## 2020-09-25 NOTE — Telephone Encounter (Signed)
-----   Message from Heath Lark, MD sent at 09/25/2020 10:02 AM EDT ----- Technically we do not space out appt that far. Would be better if she gets a dose before she leaves  ----- Message ----- From: Rennis Harding, RN Sent: 09/25/2020   9:44 AM EDT To: Heath Lark, MD  Spoke with patient she is currently continuing Atenolol as prescribed,  PCP added HCTZ during the afternoon to help manage BP.   She reports readings:  Friday AM 168/94      PM 138/73 Sat AM  162/96          PM 132/81  Patient reports overall feeling better, will start to incorporate exercise.     Patient is scheduled to see you on 6/1 and will receive Keytruda.   Next dose will be done when she is planning to be at the beach.  Traveling from 6/19-7/3.     She wanted to see if you would be comfortable with her going that long in between cycles or if she should come in prior to vacation to receive cycle?    ----- Message ----- From: Heath Lark, MD Sent: 09/25/2020   8:30 AM EDT To: Rennis Harding, RN  I spoke with her PCP last week and he said he will work her in to manage her BP Can you call and check on her?

## 2020-09-25 NOTE — Telephone Encounter (Signed)
RN updated patient on MD recommendations to delay trip vs bringing in early for infusion.   Patient verbalized understanding, request sent to have labs and infusion on 6/22.

## 2020-09-25 NOTE — Telephone Encounter (Signed)
-----   Message from Heath Lark, MD sent at 09/25/2020 10:16 AM EDT ----- No, it would only be 16 days Insurance may not cover it I am asking her to delay her beach trip ----- Message ----- From: Rennis Harding, RN Sent: 09/25/2020  10:09 AM EDT To: Heath Lark, MD  Okay, will send a request to have her infusion scheduled for 6/17.   ----- Message ----- From: Heath Lark, MD Sent: 09/25/2020  10:03 AM EDT To: Rennis Harding, RN  Technically we do not space out appt that far. Would be better if she gets a dose before she leaves  ----- Message ----- From: Rennis Harding, RN Sent: 09/25/2020   9:44 AM EDT To: Heath Lark, MD  Spoke with patient she is currently continuing Atenolol as prescribed,  PCP added HCTZ during the afternoon to help manage BP.   She reports readings:  Friday AM 168/94      PM 138/73 Sat AM  162/96          PM 132/81  Patient reports overall feeling better, will start to incorporate exercise.     Patient is scheduled to see you on 6/1 and will receive Keytruda.   Next dose will be done when she is planning to be at the beach.  Traveling from 6/19-7/3.     She wanted to see if you would be comfortable with her going that long in between cycles or if she should come in prior to vacation to receive cycle?    ----- Message ----- From: Heath Lark, MD Sent: 09/25/2020   8:30 AM EDT To: Rennis Harding, RN  I spoke with her PCP last week and he said he will work her in to manage her BP Can you call and check on her?

## 2020-09-25 NOTE — Telephone Encounter (Signed)
Patient notified and verbalized understanding and agreement.  Scheduling request sent for labs/infusion for 6/17 per MD recommendations.

## 2020-09-25 NOTE — Telephone Encounter (Signed)
Scheduled appt per 5/16 sch msg. Called pt, no answer. Left msg with appt date and time.

## 2020-09-25 NOTE — Telephone Encounter (Signed)
Erroneous entry

## 2020-09-28 ENCOUNTER — Ambulatory Visit (INDEPENDENT_AMBULATORY_CARE_PROVIDER_SITE_OTHER): Payer: Medicare Other | Admitting: Allergy

## 2020-09-28 ENCOUNTER — Other Ambulatory Visit: Payer: Self-pay

## 2020-09-28 ENCOUNTER — Encounter: Payer: Self-pay | Admitting: Allergy

## 2020-09-28 VITALS — BP 150/100 | HR 68 | Temp 98.6°F | Resp 16 | Ht 58.5 in | Wt 122.8 lb

## 2020-09-28 DIAGNOSIS — L299 Pruritus, unspecified: Secondary | ICD-10-CM

## 2020-09-28 DIAGNOSIS — I10 Essential (primary) hypertension: Secondary | ICD-10-CM | POA: Diagnosis not present

## 2020-09-28 DIAGNOSIS — R21 Rash and other nonspecific skin eruption: Secondary | ICD-10-CM | POA: Diagnosis not present

## 2020-09-28 MED ORDER — MOMETASONE FUROATE 0.1 % EX OINT: TOPICAL_OINTMENT | Freq: Two times a day (BID) | CUTANEOUS | 2 refills | Status: DC | PRN

## 2020-09-28 MED ORDER — HYDROXYZINE HCL 10 MG PO TABS: 10.0000 mg | ORAL_TABLET | Freq: Every evening | ORAL | 3 refills | Status: DC | PRN

## 2020-09-28 NOTE — Assessment & Plan Note (Signed)
.   See assessment and plan as above. 

## 2020-09-28 NOTE — Assessment & Plan Note (Signed)
Elevated reading in office today.  Follow up with PCP regarding this.

## 2020-09-28 NOTE — Assessment & Plan Note (Addendum)
8 month history of persistent rash on extremities. Denies changes in diet, meds, personal are products. Tried steroids, topical triamcinolone with no benefit. Evaluated by dermatology but no prior skin biopsy. Complicated medical history with history of multiple cancers, DVT, HTN, memory issues.  Discussed with patient and spouse at length that current rash is not typical of hives and does not seem to be "allergic" in nature.  . Get bloodwork to rule out other etiologies.   Take hydroxyzine 10mg  1 hour before bedtime as needed for the itching.  Use mometasone 0.1% ointment twice a day as needed for the rash Do not use on the face, neck, armpits or groin area. Do not use more than 3 weeks in a row.   See below for proper skin care - moisturize daily.   If no improvement, then recommend re-evaluation by dermatology for possible skin biopsy.

## 2020-09-28 NOTE — Patient Instructions (Addendum)
Rash: . Get bloodwork:  o We are ordering labs, so please allow 1-2 weeks for the results to come back. o With the newly implemented Cures Act, the labs might be visible to you at the same time that they become visible to me. However, I will not address the results until all of the results are back, so please be patient.    Take hydroxyzine 10mg  1 hour before bedtime as needed for the itching.  Use mometasone 0.1% ointment twice a day as needed for the rash Do not use on the face, neck, armpits or groin area. Do not use more than 3 weeks in a row.   See below for proper skin care - moisturize daily.  Follow up in 2 months or sooner if needed.   Skin care recommendations  Bath time: . Always use lukewarm water. AVOID very hot or cold water. Marland Kitchen Keep bathing time to 5-10 minutes. . Do NOT use bubble bath. . Use a mild soap and use just enough to wash the dirty areas. . Do NOT scrub skin vigorously.  . After bathing, pat dry your skin with a towel. Do NOT rub or scrub the skin.  Moisturizers and prescriptions:  . ALWAYS apply moisturizers immediately after bathing (within 3 minutes). This helps to lock-in moisture. . Use the moisturizer several times a day over the whole body. Kermit Balo summer moisturizers include: Aveeno, CeraVe, Cetaphil. Kermit Balo winter moisturizers include: Aquaphor, Vaseline, Cerave, Cetaphil, Eucerin, Vanicream. . When using moisturizers along with medications, the moisturizer should be applied about one hour after applying the medication to prevent diluting effect of the medication or moisturize around where you applied the medications. When not using medications, the moisturizer can be continued twice daily as maintenance.  Laundry and clothing: . Avoid laundry products with added color or perfumes. . Use unscented hypo-allergenic laundry products such as Tide free, Cheer free & gentle, and All free and clear.  . If the skin still seems dry or sensitive, you can try  double-rinsing the clothes. . Avoid tight or scratchy clothing such as wool. . Do not use fabric softeners or dyer sheets.

## 2020-09-28 NOTE — Progress Notes (Signed)
New Patient Note  RE: [name] MRN: [id number] DOB: 03/30/1941 Date of Office Visit: 09/28/2020  Consult requested by: Shon Baton, MD Primary care provider: Shon Baton, MD  Chief Complaint: Allergies (Rash on several places on her body for months now.)  History of Present Illness: I had the pleasure of seeing [name] for initial evaluation at the [facility] on 09/28/2020. She is a 80 y.o. female, who is referred here by Shon Baton, MD for the evaluation of rash. She is accompanied today by her spouse who provided/contributed to the history.   Rash started about 8+ months ago. This can occur anywhere on her body but worse on upper and lower extremities. Describes them as itchy, red, raised at times. Individual rashes has not changed and persisted throughout this time. Associated symptoms include: none. Suspected triggers are unknown. Denies any fevers, chills, changes in foods, personal care products or recent infections. She did change one of her medications Aricept but the rash has not improved since stopping the medication   She has tried the following therapies: prednisone, triamcinolone cream, zyrtec with some benefit.   Previous work up includes: saw dermatology a few months ago and was recommended to use triamcinolone. No skin biopsy.  Previous history of rash/hives: no.  Patient has history of lung cancer, uterine cancer and breast cancer - patient follows with oncology and currently on infusions.  Also had right LE DVT.  Assessment and Plan: [name] is a 80 y.o. female with: Rash and other nonspecific skin eruption 8 month history of persistent rash on extremities. Denies changes in diet, meds, personal are products. Tried steroids, topical triamcinolone with no benefit. Evaluated by dermatology but no prior skin biopsy. Complicated medical history with history of multiple cancers, DVT, HTN, memory issues.  Discussed with patient and spouse at  length that current rash is not typical of hives and does not seem to be "allergic" in nature.  . Get bloodwork to rule out other etiologies.   Take hydroxyzine 10mg  1 hour before bedtime as needed for the itching.  Use mometasone 0.1% ointment twice a day as needed for the rash Do not use on the face, neck, armpits or groin area. Do not use more than 3 weeks in a row.   See below for proper skin care - moisturize daily.   If no improvement, then recommend re-evaluation by dermatology for possible skin biopsy.   Pruritus  See assessment and plan as above.  Essential hypertension Elevated reading in office today.  Follow up with PCP regarding this.   Return in about 2 months (around 11/28/2020).  Meds ordered this encounter  Medications  . hydrOXYzine (ATARAX/VISTARIL) 10 MG tablet    Sig: Take 1 tablet (10 mg total) by mouth at bedtime as needed for itching.    Dispense:  30 tablet    Refill:  3  . mometasone (ELOCON) 0.1 % ointment    Sig: Apply topically 2 (two) times daily as needed (rash). Do not use on the face, neck, armpits or groin area. Do not use more than 3 weeks in a row.    Dispense:  45 g    Refill:  2    Lab Orders     Alpha-Gal Panel     ANA w/Reflex     C-reactive protein     Tryptase     Sedimentation rate     Allergens w/Total IgE Area 2  Other allergy screening: Asthma: no  Rhino conjunctivitis: no Food allergy: no Medication allergy: yes Hymenoptera allergy: no History of recurrent infections suggestive of immunodeficency: no  Diagnostics: None.  Past Medical History: Patient Active Problem List   Diagnosis Date Noted  . Rash and other nonspecific skin eruption 09/28/2020  . Pruritus 09/28/2020  . Skin rash 08/28/2020  . Leg ulcer ([place]) 01/12/2020  . Cellulitis of left leg 12/28/2019  . Anemia, chronic disease 12/28/2019  . Bilateral edema of lower extremity 12/28/2019  . Diarrhea 12/16/2019  . Memory impairment 11/29/2019  .  Malignant cachexia ([place]) 11/08/2019  . Deep vein blood clot of right lower extremity ([place]) 11/08/2019  . Recurrent epistaxis 11/08/2019  . Elevated serum creatinine 08/16/2019  . Acquired hypothyroidism 07/26/2019  . TIA (transient ischemic attack) 05/25/2019  . Goals of care, counseling/discussion 04/19/2019  . Dehydration 01/14/2019  . Pancytopenia, acquired ([place]) 01/14/2019  . Dysuria 12/29/2018  . Right lower lobe pulmonary nodule 12/07/2018  . Primary lung cancer, right ([place]) 11/04/2018  . Essential hypertension 01/24/2017  . Genetic testing 10/31/2016  . History of breast cancer   . Family history of breast cancer   . Aortic atherosclerosis ([place]) 06/08/2016  . Plantar fasciitis of left foot 12/13/2015  . Anemia due to antineoplastic chemotherapy 11/14/2015  . Lactose intolerance in adult 11/14/2015  . Chemotherapy-induced neuropathy ([place]) 10/08/2015  . Dense breast tissue 10/08/2015  . Port catheter in place 09/28/2015  . Central line complication 78/46/9629  . Chemotherapy induced neutropenia ([place]) 08/22/2015  . Arterial hypotension 08/12/2015  . HX: breast cancer 02/07/2015  . Metastatic cancer to bone ([place]) 06/27/2014  . Hypotension 06/27/2011  . Endometrial cancer ([place]) 05/15/2011  . Hypercholesterolemia 12/28/2010  . Palpitations 12/28/2010   Past Medical History:  Diagnosis Date  . Eczema   . Endometrial cancer ([place]) 12/2007   s/p total abdominal hysterectomy at [facility] - ovaries were also removed   . Family history of breast cancer   . History of breast cancer    right breast -- treated with lumpectomy and radiation therarpy, postoperatively with tamoxifen   . History of radiation therapy 10/22/2016 to 10/28/2016  . Hypercholesterolemia   . Hyperlipidemia   . Hypertension   . Palpitations   . Personal history of radiation therapy 2006  . PVC's (premature ventricular contractions)   . Radiation 07/11/14-08/12/14   left lumbar paraspinal area 55 gray  . S/P  radiation therapy 10/28/11-12/05/11   5040 cGy left pelvis  . S/P radiation therapy    Intracavitary brachytherapy of uterus  . Urticaria    Past Surgical History: Past Surgical History:  Procedure Laterality Date  . ADENOIDECTOMY    . APPENDECTOMY    . BREAST LUMPECTOMY Right 2000  . COLONOSCOPY     3X  . FOOT SURGERY     RIGHT  . SEGMENTECOMY Right 12/07/2018   Procedure: RIGHT LOWER LOBE SUPERIOR SEGMENTECTOMY;  Surgeon: Melrose Nakayama, MD;  Location: [facility];  Service: Thoracic;  Laterality: Right;  . TONSILLECTOMY    . TONSILLECTOMY    . TOTAL ABDOMINAL HYSTERECTOMY W/ BILATERAL SALPINGOOPHORECTOMY    . VIDEO ASSISTED THORACOSCOPY Right 12/07/2018   Procedure: VIDEO ASSISTED THORACOSCOPY;  Surgeon: Melrose Nakayama, MD;  Location: [facility] OR;  Service: Thoracic;  Laterality: Right;   Medication List:  Current Outpatient Medications  Medication Sig Dispense Refill  . atenolol (TENORMIN) 25 MG tablet Take 25 mg by mouth 2 (two) times daily.    Marland Kitchen BIOTIN PO Take 500 mcg by mouth daily.    Marland Kitchen  calcium carbonate (TUMS - DOSED IN MG ELEMENTAL CALCIUM) 500 MG chewable tablet Chew 1 tablet by mouth 2 (two) times daily.    . cetirizine (ZYRTEC) 10 MG tablet Take 10 mg by mouth daily.    . cholecalciferol (VITAMIN D3) 25 MCG (1000 UNIT) tablet Take 2,000 Units by mouth daily.    Marland Kitchen donepezil (ARICEPT) 5 MG tablet Take 1 tablet (5 mg total) by mouth at bedtime. 30 tablet 5  . furosemide (LASIX) 20 MG tablet Take 1 tablet (20 mg total) by mouth daily. 10 tablet 0  . hydrOXYzine (ATARAX/VISTARIL) 10 MG tablet Take 1 tablet (10 mg total) by mouth at bedtime as needed for itching. 30 tablet 3  . lenvatinib 10 mg daily dose (LENVIMA) capsule Take 1 capsule (10 mg total) by mouth daily. 30 capsule 11  . memantine (NAMENDA) 5 MG tablet Take 5 mg by mouth 2 (two) times daily.    . mometasone (ELOCON) 0.1 % ointment Apply topically 2 (two) times daily as needed (rash). Do not use on the face,  neck, armpits or groin area. Do not use more than 3 weeks in a row. 45 g 2  . Multiple Vitamin (MULTIVITAMIN WITH MINERALS) TABS tablet Take 1 tablet by mouth daily.    . potassium chloride (KLOR-CON) 10 MEQ tablet Take 1 tablet (10 mEq total) by mouth daily. 10 tablet 0  . rivaroxaban (XARELTO) 10 MG TABS tablet Take 1 tablet (10 mg total) by mouth daily. 30 tablet 11  . simvastatin (ZOCOR) 20 MG tablet Take 20 mg by mouth every evening.      No current facility-administered medications for this visit.   Facility-Administered Medications Ordered in Other Visits  Medication Dose Route Frequency Provider Last Rate Last Admin  . heparin lock flush 100 unit/mL  500 Units Intracatheter Once Gorsuch, Ni, MD      . sodium chloride flush (NS) 0.9 % injection 10 mL  10 mL Intracatheter Once Heath Lark, MD       Allergies: Allergies  Allergen Reactions  . Ace Inhibitors Anaphylaxis    Shortness of breath   . Dilaudid [Hydromorphone Hcl] Other (See Comments)    "total loss of her mind"  . Codeine Nausea Only   Social History: Social History   Socioeconomic History  . Marital status: Married    Spouse name: [name]  . Number of children: 2  . Years of education: Not on file  . Highest education level: Not on file  Occupational History  . Occupation: retired  Tobacco Use  . Smoking status: Former Smoker    Types: Cigarettes    Quit date: 05/13/1962    Years since quitting: 58.4  . Smokeless tobacco: Never Used  Vaping Use  . Vaping Use: Never used  Substance and Sexual Activity  . Alcohol use: Yes    Comment: occasionally  . Drug use: No  . Sexual activity: Not Currently    Birth control/protection: Post-menopausal  Other Topics Concern  . Not on file  Social History Narrative  . Not on file   Social Determinants of Health   Financial Resource Strain: Not on file  Food Insecurity: Not on file  Transportation Needs: Not on file  Physical Activity: Not on file  Stress: Not on  file  Social Connections: Not on file   Lives in a 81 year old house. Smoking: denies Occupation: retired IT sales professional HistoryFreight forwarder in the house: no Charity fundraiser in the family room: yes Carpet in  the bedroom: yes Heating: gas Cooling: central Pet: no  Family History: Family History  Problem Relation Age of Onset  . Thrombosis Father        coronary thrombosis  . Hypertension Father   . Heart failure Mother   . Asthma Mother   . Coronary artery disease Brother   . Breast cancer Maternal Aunt        dx in her 80s  . Stroke Maternal Uncle   . Stroke Maternal Grandfather   . Allergic rhinitis Neg Hx   . Eczema Neg Hx   . Urticaria Neg Hx    Review of Systems  Constitutional: Negative for appetite change, chills, fever and unexpected weight change.  HENT: Negative for congestion and rhinorrhea.   Eyes: Negative for itching.  Respiratory: Negative for cough, chest tightness, shortness of breath and wheezing.   Cardiovascular: Negative for chest pain.  Gastrointestinal: Negative for abdominal pain.  Genitourinary: Negative for difficulty urinating.  Skin: Positive for rash.  Neurological: Negative for headaches.   Objective: BP (!) 150/100 (BP Location: Right Arm, Patient Position: Sitting, Cuff Size: Normal)   Pulse 68   Temp 98.6 F (37 C) (Temporal)   Resp 16   Ht 4' 10.5" (1.486 m)   Wt 122 lb 12 oz (55.7 kg)   SpO2 95%   BMI 25.22 kg/m  Body mass index is 25.22 kg/m. Physical Exam Vitals and nursing note reviewed.  Constitutional:      Appearance: Normal appearance. She is well-developed.  HENT:     Head: Normocephalic and atraumatic.     Right Ear: Tympanic membrane and external ear normal.     Left Ear: Tympanic membrane and external ear normal.     Nose: Nose normal.     Mouth/Throat:     Mouth: Mucous membranes are moist.     Pharynx: Oropharynx is clear.  Eyes:     Conjunctiva/sclera: Conjunctivae normal.  Cardiovascular:      Rate and Rhythm: Normal rate and regular rhythm.     Heart sounds: Normal heart sounds. No murmur heard. No friction rub. No gallop.   Pulmonary:     Effort: Pulmonary effort is normal.     Breath sounds: Normal breath sounds. No wheezing, rhonchi or rales.  Musculoskeletal:     Cervical back: Neck supple.     Comments: Right lower extremity edema  Skin:    General: Skin is warm.     Findings: Rash present.     Comments: Raised, nodular erythematous rash on the posterior lower arm area b/l. Similar rash on the right knee area with hyperpigmented skin changes   Neurological:     Mental Status: She is alert and oriented to person, place, and time.  Psychiatric:        Behavior: Behavior normal.    The plan was reviewed with the patient/family, and all questions/concerned were addressed.  It was my pleasure to see [name] today and participate in her care. Please feel free to contact me with any questions or concerns.  Sincerely,  Rexene Alberts, DO Allergy & Immunology  [facility] office: [place] office: [phone]

## 2020-10-11 ENCOUNTER — Other Ambulatory Visit: Payer: Self-pay

## 2020-10-11 ENCOUNTER — Inpatient Hospital Stay: Payer: Medicare Other

## 2020-10-11 ENCOUNTER — Inpatient Hospital Stay: Payer: Medicare Other | Attending: Hematology and Oncology

## 2020-10-11 ENCOUNTER — Telehealth: Payer: Self-pay

## 2020-10-11 ENCOUNTER — Other Ambulatory Visit: Payer: Self-pay | Admitting: Hematology and Oncology

## 2020-10-11 ENCOUNTER — Ambulatory Visit: Payer: Medicare Other | Admitting: Hematology and Oncology

## 2020-10-11 ENCOUNTER — Other Ambulatory Visit: Payer: Medicare Other

## 2020-10-11 VITALS — BP 165/99 | HR 60 | Temp 98.0°F | Resp 16

## 2020-10-11 DIAGNOSIS — Z7189 Other specified counseling: Secondary | ICD-10-CM

## 2020-10-11 DIAGNOSIS — Z885 Allergy status to narcotic agent status: Secondary | ICD-10-CM | POA: Insufficient documentation

## 2020-10-11 DIAGNOSIS — R809 Proteinuria, unspecified: Secondary | ICD-10-CM | POA: Insufficient documentation

## 2020-10-11 DIAGNOSIS — Z5112 Encounter for antineoplastic immunotherapy: Secondary | ICD-10-CM | POA: Diagnosis not present

## 2020-10-11 DIAGNOSIS — C7951 Secondary malignant neoplasm of bone: Secondary | ICD-10-CM | POA: Insufficient documentation

## 2020-10-11 DIAGNOSIS — R4189 Other symptoms and signs involving cognitive functions and awareness: Secondary | ICD-10-CM | POA: Diagnosis not present

## 2020-10-11 DIAGNOSIS — M549 Dorsalgia, unspecified: Secondary | ICD-10-CM | POA: Insufficient documentation

## 2020-10-11 DIAGNOSIS — I10 Essential (primary) hypertension: Secondary | ICD-10-CM | POA: Diagnosis not present

## 2020-10-11 DIAGNOSIS — C541 Malignant neoplasm of endometrium: Secondary | ICD-10-CM | POA: Insufficient documentation

## 2020-10-11 DIAGNOSIS — E039 Hypothyroidism, unspecified: Secondary | ICD-10-CM | POA: Insufficient documentation

## 2020-10-11 DIAGNOSIS — Z79899 Other long term (current) drug therapy: Secondary | ICD-10-CM | POA: Diagnosis not present

## 2020-10-11 DIAGNOSIS — Z7901 Long term (current) use of anticoagulants: Secondary | ICD-10-CM | POA: Diagnosis not present

## 2020-10-11 DIAGNOSIS — C3431 Malignant neoplasm of lower lobe, right bronchus or lung: Secondary | ICD-10-CM | POA: Diagnosis not present

## 2020-10-11 LAB — CMP (CANCER CENTER ONLY)
ALT: 27 U/L (ref 0–44)
AST: 40 U/L (ref 15–41)
Albumin: 3.8 g/dL (ref 3.5–5.0)
Alkaline Phosphatase: 60 U/L (ref 38–126)
Anion gap: 15 (ref 5–15)
BUN: 20 mg/dL (ref 8–23)
CO2: 25 mmol/L (ref 22–32)
Calcium: 10.3 mg/dL (ref 8.9–10.3)
Chloride: 104 mmol/L (ref 98–111)
Creatinine: 0.94 mg/dL (ref 0.44–1.00)
GFR, Estimated: >60 mL/min (ref >60)
Glucose, Bld: 141 mg/dL — ABNORMAL HIGH (ref 70–99)
Potassium: 3 mmol/L — ABNORMAL LOW (ref 3.5–5.1)
Sodium: 144 mmol/L (ref 135–145)
Total Bilirubin: 1.2 mg/dL (ref 0.3–1.2)
Total Protein: 7.5 g/dL (ref 6.5–8.1)

## 2020-10-11 LAB — CBC WITH DIFFERENTIAL (CANCER CENTER ONLY)
Abs Immature Granulocytes: 0.04 10*3/uL (ref 0.00–0.07)
Basophils Absolute: 0.1 10*3/uL (ref 0.0–0.1)
Basophils Relative: 1 %
Eosinophils Absolute: 0.2 10*3/uL (ref 0.0–0.5)
Eosinophils Relative: 3 %
HCT: 41.9 % (ref 36.0–46.0)
Hemoglobin: 14 g/dL (ref 12.0–15.0)
Immature Granulocytes: 1 %
Lymphocytes Relative: 17 %
Lymphs Abs: 1.1 10*3/uL (ref 0.7–4.0)
MCH: 32 pg (ref 26.0–34.0)
MCHC: 33.4 g/dL (ref 30.0–36.0)
MCV: 95.7 fL (ref 80.0–100.0)
Monocytes Absolute: 0.9 10*3/uL (ref 0.1–1.0)
Monocytes Relative: 14 %
Neutro Abs: 4.1 10*3/uL (ref 1.7–7.7)
Neutrophils Relative %: 64 %
Platelet Count: 139 10*3/uL — ABNORMAL LOW (ref 150–400)
RBC: 4.38 MIL/uL (ref 3.87–5.11)
RDW: 13.8 % (ref 11.5–15.5)
WBC Count: 6.3 10*3/uL (ref 4.0–10.5)
nRBC: 0 % (ref 0.0–0.2)

## 2020-10-11 LAB — TSH: TSH: 7.772 u[IU]/mL — ABNORMAL HIGH (ref 0.308–3.960)

## 2020-10-11 LAB — TOTAL PROTEIN, URINE DIPSTICK

## 2020-10-11 MED ORDER — SODIUM CHLORIDE 0.9 % IV SOLN
Freq: Once | INTRAVENOUS | Status: AC
Start: 2020-10-11 — End: 2020-10-11
  Filled 2020-10-11: qty 250

## 2020-10-11 MED ORDER — SODIUM CHLORIDE 0.9 % IV SOLN
200.0000 mg | Freq: Once | INTRAVENOUS | Status: AC
  Administered 2020-10-11: 200 mg via INTRAVENOUS
  Filled 2020-10-11: qty 8

## 2020-10-11 MED ORDER — LEVOTHYROXINE SODIUM 25 MCG PO TABS: 25.0000 ug | ORAL_TABLET | Freq: Every day | ORAL | 3 refills | Status: DC

## 2020-10-11 MED ORDER — HEPARIN SOD (PORK) LOCK FLUSH 100 UNIT/ML IV SOLN
500.0000 [IU] | Freq: Once | INTRAVENOUS | Status: AC | PRN
  Administered 2020-10-11: 500 [IU]
  Filled 2020-10-11: qty 5

## 2020-10-11 MED ORDER — SODIUM CHLORIDE 0.9% FLUSH
10.0000 mL | INTRAVENOUS | Status: DC | PRN
  Administered 2020-10-11: 10 mL
  Filled 2020-10-11: qty 10

## 2020-10-11 NOTE — Telephone Encounter (Signed)
-----   Message from Heath Lark, MD sent at 10/11/2020 12:31 PM EDT ----- She is not seen today TSH is high I sent prescription synthroid to pharmacy, pls let her know Take 30 mins before breakfast

## 2020-10-11 NOTE — Patient Instructions (Signed)
[facility] ONCOLOGY  Discharge Instructions: Thank you for choosing [facility] to provide your oncology and hematology care.   If you have a lab appointment with the [facility], please go directly to the [facility] and check in at the registration area.   Wear comfortable clothing and clothing appropriate for easy access to any Portacath or PICC line.   We strive to give you quality time with your provider. You may need to reschedule your appointment if you arrive late (15 or more minutes).  Arriving late affects you and other patients whose appointments are after yours.  Also, if you miss three or more appointments without notifying the office, you may be dismissed from the clinic at the provider's discretion.      For prescription refill requests, have your pharmacy contact our office and allow 72 hours for refills to be completed.    Today you received the following chemotherapy and/or immunotherapy agents [name]     To help prevent nausea and vomiting after your treatment, we encourage you to take your nausea medication as directed.  BELOW ARE SYMPTOMS THAT SHOULD BE REPORTED IMMEDIATELY: . *FEVER GREATER THAN 100.4 F (38 C) OR HIGHER . *CHILLS OR SWEATING . *NAUSEA AND VOMITING THAT IS NOT CONTROLLED WITH YOUR NAUSEA MEDICATION . *UNUSUAL SHORTNESS OF BREATH . *UNUSUAL BRUISING OR BLEEDING . *URINARY PROBLEMS (pain or burning when urinating, or frequent urination) . *BOWEL PROBLEMS (unusual diarrhea, constipation, pain near the anus) . TENDERNESS IN MOUTH AND THROAT WITH OR WITHOUT PRESENCE OF ULCERS (sore throat, sores in mouth, or a toothache) . UNUSUAL RASH, SWELLING OR PAIN  . UNUSUAL VAGINAL DISCHARGE OR ITCHING   Items with * indicate a potential emergency and should be followed up as soon as possible or go to the Emergency Department if any problems should occur.  Please show the CHEMOTHERAPY ALERT CARD or IMMUNOTHERAPY ALERT  CARD at check-in to the Emergency Department and triage nurse.  Should you have questions after your visit or need to cancel or reschedule your appointment, please contact [facility]  Dept: [phone]  and follow the prompts.  Office hours are 8:00 a.m. to 4:30 p.m. Monday - Friday. Please note that voicemails left after 4:00 p.m. may not be returned until the following business day.  We are closed weekends and major holidays. You have access to a nurse at all times for urgent questions. Please call the main number to the clinic Dept: [phone] and follow the prompts.   For any non-urgent questions, you may also contact your provider using MyChart. We now offer e-Visits for anyone 19 and older to request care online for non-urgent symptoms. For details visit [email or web address].   Also download the MyChart app! Go to the app store, search "MyChart", open the app, select [facility], and log in with your MyChart username and password.  Due to Covid, a mask is required upon entering the hospital/clinic. If you do not have a mask, one will be given to you upon arrival. For doctor visits, patients may have 1 support person aged 73 or older with them. For treatment visits, patients cannot have anyone with them due to current Covid guidelines and our immunocompromised population.

## 2020-10-11 NOTE — Telephone Encounter (Signed)
Called and left below message. Ask her to call the office back for questions. 

## 2020-10-11 NOTE — Telephone Encounter (Signed)
Called and given below message. Husband verbalized understanding.

## 2020-10-12 LAB — T4: T4, Total: 6.3 ug/dL (ref 4.5–12.0)

## 2020-10-19 ENCOUNTER — Other Ambulatory Visit: Payer: Self-pay | Admitting: Neurology

## 2020-10-19 NOTE — Telephone Encounter (Signed)
Script sent in

## 2020-10-23 DIAGNOSIS — Z23 Encounter for immunization: Secondary | ICD-10-CM | POA: Diagnosis not present

## 2020-10-31 ENCOUNTER — Other Ambulatory Visit (HOSPITAL_COMMUNITY): Payer: Self-pay

## 2020-10-31 ENCOUNTER — Telehealth: Payer: Self-pay

## 2020-10-31 ENCOUNTER — Other Ambulatory Visit: Payer: Self-pay

## 2020-10-31 ENCOUNTER — Encounter: Payer: Self-pay | Admitting: Hematology and Oncology

## 2020-10-31 ENCOUNTER — Inpatient Hospital Stay: Payer: Medicare Other

## 2020-10-31 ENCOUNTER — Inpatient Hospital Stay (HOSPITAL_BASED_OUTPATIENT_CLINIC_OR_DEPARTMENT_OTHER): Payer: Medicare Other | Admitting: Hematology and Oncology

## 2020-10-31 VITALS — BP 177/88 | HR 79 | Temp 98.0°F | Resp 18 | Wt 120.8 lb

## 2020-10-31 DIAGNOSIS — C541 Malignant neoplasm of endometrium: Secondary | ICD-10-CM

## 2020-10-31 DIAGNOSIS — R413 Other amnesia: Secondary | ICD-10-CM | POA: Diagnosis not present

## 2020-10-31 DIAGNOSIS — I10 Essential (primary) hypertension: Secondary | ICD-10-CM | POA: Diagnosis not present

## 2020-10-31 DIAGNOSIS — C3491 Malignant neoplasm of unspecified part of right bronchus or lung: Secondary | ICD-10-CM

## 2020-10-31 DIAGNOSIS — E039 Hypothyroidism, unspecified: Secondary | ICD-10-CM

## 2020-10-31 DIAGNOSIS — C7951 Secondary malignant neoplasm of bone: Secondary | ICD-10-CM | POA: Diagnosis not present

## 2020-10-31 DIAGNOSIS — Z7189 Other specified counseling: Secondary | ICD-10-CM

## 2020-10-31 DIAGNOSIS — C3431 Malignant neoplasm of lower lobe, right bronchus or lung: Secondary | ICD-10-CM | POA: Diagnosis not present

## 2020-10-31 DIAGNOSIS — R809 Proteinuria, unspecified: Secondary | ICD-10-CM | POA: Diagnosis not present

## 2020-10-31 DIAGNOSIS — Z5112 Encounter for antineoplastic immunotherapy: Secondary | ICD-10-CM | POA: Diagnosis not present

## 2020-10-31 LAB — CBC WITH DIFFERENTIAL (CANCER CENTER ONLY)
Abs Immature Granulocytes: 0.04 10*3/uL (ref 0.00–0.07)
Basophils Absolute: 0.1 10*3/uL (ref 0.0–0.1)
Basophils Relative: 1 %
Eosinophils Absolute: 0.2 10*3/uL (ref 0.0–0.5)
Eosinophils Relative: 2 %
HCT: 41.1 % (ref 36.0–46.0)
Hemoglobin: 13.7 g/dL (ref 12.0–15.0)
Immature Granulocytes: 1 %
Lymphocytes Relative: 25 %
Lymphs Abs: 1.6 10*3/uL (ref 0.7–4.0)
MCH: 32.4 pg (ref 26.0–34.0)
MCHC: 33.3 g/dL (ref 30.0–36.0)
MCV: 97.2 fL (ref 80.0–100.0)
Monocytes Absolute: 0.9 10*3/uL (ref 0.1–1.0)
Monocytes Relative: 14 %
Neutro Abs: 3.9 10*3/uL (ref 1.7–7.7)
Neutrophils Relative %: 57 %
Platelet Count: 155 10*3/uL (ref 150–400)
RBC: 4.23 MIL/uL (ref 3.87–5.11)
RDW: 13.5 % (ref 11.5–15.5)
WBC Count: 6.7 10*3/uL (ref 4.0–10.5)
nRBC: 0 % (ref 0.0–0.2)

## 2020-10-31 LAB — CMP (CANCER CENTER ONLY)
ALT: 29 U/L (ref 0–44)
AST: 41 U/L (ref 15–41)
Albumin: 3.9 g/dL (ref 3.5–5.0)
Alkaline Phosphatase: 52 U/L (ref 38–126)
Anion gap: 8 (ref 5–15)
BUN: 25 mg/dL — ABNORMAL HIGH (ref 8–23)
CO2: 29 mmol/L (ref 22–32)
Calcium: 9.7 mg/dL (ref 8.9–10.3)
Chloride: 103 mmol/L (ref 98–111)
Creatinine: 1.19 mg/dL — ABNORMAL HIGH (ref 0.44–1.00)
GFR, Estimated: 46 mL/min — ABNORMAL LOW (ref >60)
Glucose, Bld: 118 mg/dL — ABNORMAL HIGH (ref 70–99)
Potassium: 3.3 mmol/L — ABNORMAL LOW (ref 3.5–5.1)
Sodium: 140 mmol/L (ref 135–145)
Total Bilirubin: 1.2 mg/dL (ref 0.3–1.2)
Total Protein: 7.1 g/dL (ref 6.5–8.1)

## 2020-10-31 LAB — TSH: TSH: 9.811 u[IU]/mL — ABNORMAL HIGH (ref 0.350–4.500)

## 2020-10-31 LAB — TOTAL PROTEIN, URINE DIPSTICK: Protein, ur: 100 mg/dL — AB

## 2020-10-31 MED ORDER — OXYCODONE HCL 5 MG PO TABS
5.0000 mg | ORAL_TABLET | Freq: Four times a day (QID) | ORAL | 0 refills | Status: DC | PRN
  Filled 2020-10-31: qty 60, 15d supply, fill #0
  Filled 2020-11-15: qty 30, 7d supply, fill #0

## 2020-10-31 MED ORDER — SODIUM CHLORIDE 0.9 % IV SOLN
Freq: Once | INTRAVENOUS | Status: AC
  Filled 2020-10-31: qty 250

## 2020-10-31 MED ORDER — SODIUM CHLORIDE 0.9 % IV SOLN
200.0000 mg | Freq: Once | INTRAVENOUS | Status: AC
  Administered 2020-10-31: 200 mg via INTRAVENOUS
  Filled 2020-10-31: qty 8

## 2020-10-31 MED ORDER — LEVOTHYROXINE SODIUM 50 MCG PO TABS: 50.0000 ug | ORAL_TABLET | Freq: Every day | ORAL | 3 refills | Status: DC

## 2020-10-31 NOTE — Assessment & Plan Note (Signed)
Her TSH is elevated It is not clear whether this contributed to her worsening memory issues I will increase her Synthroid dose and we will continue to monitor her TSH carefully

## 2020-10-31 NOTE — Telephone Encounter (Signed)
-----   Message from Heath Lark, MD sent at 10/31/2020 10:57 AM EDT ----- Her TSH is high Call her husband (pt has dementia), tell him to increase her synthroid to 50 mcg Call in new synthroid to her pharmacy

## 2020-10-31 NOTE — Telephone Encounter (Signed)
Called and  given below message to Husband. He verbalized understanding. Rx sent to preferred pharmacy.

## 2020-10-31 NOTE — Patient Instructions (Signed)
[facility] ONCOLOGY  Discharge Instructions: Thank you for choosing [facility] to provide your oncology and hematology care.   If you have a lab appointment with the [facility], please go directly to the [facility] and check in at the registration area.   Wear comfortable clothing and clothing appropriate for easy access to any Portacath or PICC line.   We strive to give you quality time with your provider. You may need to reschedule your appointment if you arrive late (15 or more minutes).  Arriving late affects you and other patients whose appointments are after yours.  Also, if you miss three or more appointments without notifying the office, you may be dismissed from the clinic at the provider's discretion.      For prescription refill requests, have your pharmacy contact our office and allow 72 hours for refills to be completed.    Today you received the following chemotherapy and/or immunotherapy agents [name]     To help prevent nausea and vomiting after your treatment, we encourage you to take your nausea medication as directed.  BELOW ARE SYMPTOMS THAT SHOULD BE REPORTED IMMEDIATELY: *FEVER GREATER THAN 100.4 F (38 C) OR HIGHER *CHILLS OR SWEATING *NAUSEA AND VOMITING THAT IS NOT CONTROLLED WITH YOUR NAUSEA MEDICATION *UNUSUAL SHORTNESS OF BREATH *UNUSUAL BRUISING OR BLEEDING *URINARY PROBLEMS (pain or burning when urinating, or frequent urination) *BOWEL PROBLEMS (unusual diarrhea, constipation, pain near the anus) TENDERNESS IN MOUTH AND THROAT WITH OR WITHOUT PRESENCE OF ULCERS (sore throat, sores in mouth, or a toothache) UNUSUAL RASH, SWELLING OR PAIN  UNUSUAL VAGINAL DISCHARGE OR ITCHING   Items with * indicate a potential emergency and should be followed up as soon as possible or go to the Emergency Department if any problems should occur.  Please show the CHEMOTHERAPY ALERT CARD or IMMUNOTHERAPY ALERT CARD at check-in to the  Emergency Department and triage nurse.  Should you have questions after your visit or need to cancel or reschedule your appointment, please contact [facility]  Dept: [phone]  and follow the prompts.  Office hours are 8:00 a.m. to 4:30 p.m. Monday - Friday. Please note that voicemails left after 4:00 p.m. may not be returned until the following business day.  We are closed weekends and major holidays. You have access to a nurse at all times for urgent questions. Please call the main number to the clinic Dept: [phone] and follow the prompts.   For any non-urgent questions, you may also contact your provider using MyChart. We now offer e-Visits for anyone 29 and older to request care online for non-urgent symptoms. For details visit [email or web address].   Also download the MyChart app! Go to the app store, search "MyChart", open the app, select [facility], and log in with your MyChart username and password.  Due to Covid, a mask is required upon entering the hospital/clinic. If you do not have a mask, one will be given to you upon arrival. For doctor visits, patients may have 1 support person aged 27 or older with them. For treatment visits, patients cannot have anyone with them due to current Covid guidelines and our immunocompromised population.

## 2020-10-31 NOTE — Progress Notes (Signed)
[place] OFFICE PROGRESS NOTE  Patient Care Team: Shon Baton, MD as PCP - General (Internal Medicine) Heath Lark, MD as Consulting Physician (Hematology and Oncology)  ASSESSMENT & PLAN:  Endometrial cancer [facility]) She tolerated treatment very poorly Based on the interaction today with her husband, I am wondering whether the patient is taking her medications correctly I alerted her primary care doctor about our conversation today Due to heavy proteinuria, I told the husband to discontinue Lenvima We will continue pembrolizumab only today We will schedule PET CT scan before her next cycle of treatment for objective assessment of response to therapy  Acquired hypothyroidism Her TSH is elevated It is not clear whether this contributed to her worsening memory issues I will increase her Synthroid dose and we will continue to monitor her TSH carefully  Memory impairment She has significant cognitive decline I spoke with her primary care doctor She is on [place] She has appointment to see neurologist next month I am concerned about her compliance to medications  Essential hypertension She has uncontrolled hypertension today She have heavy proteinuria According to her husband, he relies on her to check her blood pressure but the patient could not recall her blood pressure readings I recommend she continue to check her blood pressure daily and report to her primary care doctor for medication adjustment For today, we will hold off [place]  Metastatic cancer to bone [facility]) I do not recommend her husband to share his medication with the patient I gave her a prescription of oxycodone to take as needed We discussed narcotic refill policy and I reminded the patient and her husband risk of constipation while taking pain medicine I will reassess pain control in her next visit  Orders Placed This Encounter  Procedures   NM PET Image Restage (PS) Skull Base to Thigh     Standing Status:   Future    Standing Expiration Date:   10/31/2021    Order Specific Question:   If indicated for the ordered procedure, I authorize the administration of a radiopharmaceutical per Radiology protocol    Answer:   Yes    Order Specific Question:   Preferred imaging location?    Answer:   [facility]    Order Specific Question:   Radiology Contrast Protocol - do NOT remove file path    Answer:   [email or web address]    All questions were answered. The patient knows to call the clinic with any problems, questions or concerns. The total time spent in the appointment was 40 minutes encounter with patients including review of chart and various tests results, discussions about plan of care and coordination of care plan   Heath Lark, MD 10/31/2020 2:00 PM  INTERVAL HISTORY: Please see below for problem oriented charting. She returns with her husband today In the first half of our conversation, she appears to be doing well She did not bring her blood pressure readings to the clinic According to the patient, her blood pressure has been good I have noticed the blood pressure readings here has been high She did not recall abnormal blood pressure numbers To is the end of her discussion, her husband brought up the fact that the patient has been complaining of intermittent back pain The patient did not felt that she has back pain After in-depth questioning, she admits that she does have intermittent back pain Her husband has been giving her his supplies of pain medicine, on average at least twice daily The patient  did not recall those incidents At present time, she denies pain She denies recent fall or trauma  SUMMARY OF ONCOLOGIC HISTORY: Oncology History Overview Note  Biopsy of recurrence 06-2014 ER negative. PDL1 testing low at 2% on the uterine cancer She progressed on carboplatin and Paclitaxel for uterine cancer HER 2 negative  on pathology [id number] Negative genetic testing  On her lung cancer sample, [employer] [facility] testing revealed 10% PD-1 positive EGFR mutation was detected, exon 19 deletion   Endometrial cancer ([place])  12/01/2007 Initial Diagnosis   She has recurrent papillary serous endometrial carcinoma. Briefly she was diagnosed in 2009 with a FIGO IB pappilary serous carcinoma treated with hysterectomy followed by adjuvant chemo and vaginal brachytherapy. She then had a recurrence diagnosed in late 2012 that was deemed not to be surgically resectable, and was treated with 6 cycles of carbo/taxol followed by 5040 cGy of EBRT to the pelvic mass. She was then followed with serial imaging and was noted in June of this year to have slight increase in the size of the mass, and again in September there was small enlargement of the mass. She had a re-staging PET scan on 03/10/13 that showed FDG activity in the pelvic mass, with no other areas of disease    12/01/2007 Imaging   CT scan of chest, abdomen and pelvis: 1.  Mass like area of decreased attenuation in the central uterus, consistent with the known endometrial carcinoma.  Deep myometrial invasion is suspected.  Pelvic MRI without and with contrast could be performed for further staging workup if clinically warranted. 2.  No evidence of extrauterine extension or lymphadenopathy. 3.  Sigmoid diverticulosis incidentally noted.    12/15/2007 Surgery   --12/2007 laparoscopic hysterectomy and PLND --05/2008 complted 6 cycles adjuvant carbo/taxol followed by vaginal cuff brachy --03/2011 exploratory lararoscopy, ureterolysis --4/13 completed 6 cycels carbotaxol --8/13 completed EBRT to the left pelvic sidewall    06/10/2008 Imaging   CT scan abdomen and pelvis 1.  Nonspecific mildly prominent inguinal lymph nodes, right greater than left. 2.  Interval hysterectomy without evidence of recurrent pelvic mass or fluid collection.    03/22/2011 Imaging   Ct scan abdomen  and pelvis 1.  Local uterine cancer recurrence with two large nodes along the left pelvic sidewall. 2.  No evidence of bowel obstruction, urinary obstruction, or more distant metastasis.    03/31/2011 Relapse/Recurrence   chemotherapy and external beam radiation    05/16/2011 - 09/17/2011 Chemotherapy   She had 6 cycles of carboplatin and Taxol     07/17/2011 Imaging   Ct scan abdomen and pelvis 1.  Interval improvement in the previously demonstrated left pelvic local recurrence of tumor. 2.  No disease progression or complication identified. 3.  Mild bladder wall thickening on the right, nonspecific and possibly related to incomplete distension and/or radiation therapy. 4.  Cholelithiasis    10/11/2011 Imaging   CT scan abdomen and pelvis 1.  Interval enlargement of the left pelvic sidewall masses. 2.  No new nodular disease in the pelvis or lymphadenopathy. 3.  No evidence  of distant metastasis. 4.  Mild hydronephrosis on the right is similar to prior. 5.  Focal thickening of the right aspect the bladder is stable compared to prior.      10/28/2011 - 12/05/2011 Radiation Therapy   radiation for pelvic sidewall recurrence    10/28/2011 - 12/05/2011 Radiation Therapy   10/28/11-12/05/11: Radiotherapy to the left pelvic sidewall    03/18/2012 Imaging   CT abdomen 1.  Today's study demonstrates a positive response to therapy with decreased size of left pelvic sidewall nodal masses, as detailed above.  No new soft tissue masses or new lymphadenopathy is identified within the abdomen or pelvis. Continue attention on follow-up studies is recommended. 2.  Cholelithiasis without findings to suggest acute cholecystitis.  3.  Colonic diverticulosis without findings to suggest acute diverticulitis at this time. 4.  Mild asymmetric urinary bladder wall thickening is unchanged compared to prior examinations and is nonspecific.  No definite bladder wall mass is identified at this time. 5.   Additional findings, similar to prior examinations, as above    06/22/2012 Imaging   CT abdomen 1.  Further decreased size of the left pelvic peripherally enhancing lesion. 2.  No new sites of new or progressive disease. 3. Esophageal air fluid level suggests dysmotility or gastroesophageal reflux. 4.  Cholelithiasis    11/02/2012 Imaging   CT abdomen 1.  The left pelvic side wall lesion demonstrates mild increase in size from previous exam. 2.  No new sites of new or progressive disease. 3.  Cholelithiasis.      01/28/2013 Imaging   CT abdomen 1. Interval small increase in volume of enhancing mass adjacent to the left pelvic sidewall. 2. No evidence of abdominal or pelvic lymphadenopathy    04/02/2013 Surgery   pelvic mass resection with IORT    04/02/2013 - 04/02/2013 Radiation Therapy   04/02/13: Intraoperative radiotherapy to the left pelvis    05/10/2013 PET scan   1. Hypermetabolic mass in the deep left pelvis consistent with metastasis. 2. No additional evidence of local metastasis. No evidence of distant metastasis. 3. Small right lower lobe pulmonary nodule is not hypermetabolic.    07/27/2013 Imaging   No evidence of recurrent or metastatic disease. Apparent prior resection of the deep left pelvic metastasis.    02/08/2014 Imaging   No CT findings for recurrent or metastatic disease involving the abdomen/ pelvis    06/13/2014 Relapse/Recurrence   + recurrence paraspinous muscle    06/21/2014 Procedure   There is a soft tissue lesion along the left side of L3-L4. This lesion roughly measures up to 2.2 cm. Needle was positioned along the posterior aspect of the lesion. Small amount of air in the paraspinal tissues following needle removal    06/21/2014 Pathology Results   Bone, biopsy, left lumbar paraspinal - METASTATIC POORLY DIFFERENTIATED CARCINOMA, CONSISTENT WITH HIGH GRADE SEROUS CARCINOMA SEE COMMENT. Microscopic Comment The carcinoma demonstrates the  following immunophenotype: Cytokeratin 7 - patchy moderate to strong expression Estrogen receptor - negative expression P53 - strong diffuse expression TTF-1 - negative expression WT-1 - negative expression CD56 - focal moderate strong expression Synaptophysin - negative expression GCDFP - negative expression The history of primary endometrial papillary serous carcinoma and primary mammary carcinoma is noted. In the current case, the overall morphologic and immunophenotype are that of poorly differentiated carcinoma, consistent with high grade serous carcinoma.     07/11/2014 - 08/12/2014 Radiation Therapy   07/11/14-08/12/14: 55 Gy in 25 fractions to the Lumbar spine    08/15/2014 Imaging   CT scan of chest, abdomen and pelvis 1. Since the biopsy study of 06/21/2014, similar to slight decrease in size of a left paraspinous lesion at L3-4. 2. No new sites of metastatic disease identified. 3. 8 mm ground-glass nodule in the right lower lobe is grossly similar to 03/10/2013, suggesting a benign etiology. 4. Cholelithiasis. 5. Apparent sigmoid colonic wall thickening which could be due to underdistention. Colitis felt  less likely. 6.  Atherosclerosis, including within the coronary arteries. 7. Pelvic floor laxity.    10/07/2014 Imaging   CT scan of chest, abdomen and pelvis: Stable to slight interval decrease in size of left paraspinous lesion at L3-4. No new sites of metastatic disease. Unchanged ground-glass nodule within the right lower lobe, potentially benign in etiology. Recommend attention on followup. Cholelithiasis. Sigmoid colonic diverticulosis. No CT evidence to suggest acute diverticulitis.    01/19/2015 Imaging   Ct scan of abdomen and pelvis 1. Decrease in size of left paraspinous metastasis. 2. No new sites of disease. 3. Stable ground-glass attenuating nodule within the superior segment of right lower lobe. 4. Gallstones    05/02/2015 Imaging   Ct abdomen and pelvis:  Slight interval increase in size of left lumbar paraspinal soft tissue metastasis. No new sites of metastatic disease identified within the abdomen or pelvis.      05/11/2015 PET scan   1. The small soft tissue mass just lateral to the left L3 for neural foramen is hypermetabolic, favoring malignancy. 2. There is also a small focus of hypermetabolic activity between the left L1 and L2 transverse processes, but without a CT correlate. 3. The 13 mm sub solid nodule in the superior segment right lower lobe is stable from recent exams but has slowly increased in size over the last 7 years. Although not hypermetabolic, the appearance is concerning for the possibility of a low grade adenocarcinoma. 4. Other imaging findings of potential clinical significance: Chronic bilateral maxillary sinusitis. Coronary, aortic arch, and branch vessel atherosclerotic vascular disease. Aortoiliac atherosclerotic vascular disease. Cholelithiasis. Sigmoid colon diverticulosis. Lumbar scoliosis.    07/11/2015 Imaging   Ct scan of chest, abdomen and pelvis: 1. 13 mm mixed solid and sub solid nodule in the posterior right lower lobe was present in 2009 and has clearly progressed in the interval since that study. Imaging features remain highly concerning for low-grade or well differentiated adenocarcinoma. 2. Slight increase size of in the hypermetabolic, rim enhancing nodule identified adjacent to the left L3-4 neural foramen. Metastatic disease remains a concern. 3. No evidence for discrete soft tissue lesion between the left L1 and 2 transverse processes, the site of focal FDG uptake on the recent PET-CT. 4. Cholelithiasis. 5. Abdominal aortic atherosclerosis    08/03/2015 - 01/16/2016 Chemotherapy   She received carboplatin and Taxol   11/06/2015 PET scan   1. Hypermetabolic left E5-6 paraspinous metastasis (biopsy-proven) is stable in size and slightly decreased in metabolism. 2. Previously described small focus of  hypermetabolism between the left L2 and L3 spinous processes has resolved. 3. New mild linear hypermetabolism to the left of the T11-12 spinous processes without discrete mass on the CT images, favor benign activity related activity, recommend attention on follow-up PET-CT.  4. No definite new sites of hypermetabolic metastatic disease. No recurrent hypermetabolic metastatic disease in the pelvis. 5. Interval stability of subsolid 1.3 cm superior segment right lower lobe pulmonary nodule without associated significant metabolism, which has grown compared to the 2009 chest CT study, and remain suspicious for low grade adenocarcinoma. 6. Additional findings include aortic atherosclerosis, coronary atherosclerosis, mild multinodular goiter with no hypermetabolic thyroid nodules, cholelithiasis and moderate sigmoid diverticulosis.    02/12/2016 PET scan   1. Interval increase in size and metabolic activity of the LEFT paraspinal soft tissue metastasis. 2. New activity within the musculature of the LEFT chest wall. Given the unusual location of the paraspinal metastasis cannot exclude a second soft tissue metastasis to the  musculature however favor benign physiologic activity. 3. Stable RIGHT lower lobe pulmonary nodule. 4. No evidence of local recurrence.        04/08/2016 Imaging   Ct scan of chest, abdomen and pelvis: 1. Similar size of a  left paravertebral abdominal lesion. 2. No new or progressive metastatic disease. 3. No correlate for the muscular activity about the lateral left chest wall. 4.  Coronary artery atherosclerosis. Aortic atherosclerosis. 5. Cholelithiasis. 6. Similar right lower lobe pulmonary nodule.    06/03/2016 Imaging   CT abdomen and pelvis 1. Similar to mild enlargement of a paravertebral soft tissue lesion at the L3-4 level. 2. No new sites of disease identified. 3. Cholelithiasis. 4. Hysterectomy. 5.  Aortic atherosclerosis.      09/02/2016 Imaging   CT  abdomen and pelvis 1. Continue mild interval increase in size of LEFT paraspinal mass (1-2 mm). 2. No evidence of new metastatic disease in the abdomen pelvis. 3. Post hysterectomy anatomy    09/26/2016 Imaging   MR lumbar spine 1. Left paraspinal metastasis with epicenter adjacent to the left L3 neural foramen does extend into the foramen to the level of the left lateral epidural space (series 9, image 31), but also tracks cephalad and caudal along the L2-L4 lumbar plexus (series 10, image 15). No extension into the left L2 or L4 neural foramina. And no more distal extension of tumor. 2. Abnormal signal in the medial left psoas and ventral left erector spinae muscles at L2 and L3 is probably denervation related. 3. No bone invasion or osseous metastatic disease. Suspect previous radiation of the L2 through L5 spinal levels. 4. No dural or intradural metastatic disease.      10/22/2016 Procedure   She underwent stereotactic Radiosurgery    10/30/2016 Genetic Testing   Patient has genetic testing done for Inheritable genetic mutation panel. Results revealed patient has no actionable mutation.    01/21/2017 Imaging   1. Reduced size and conspicuity of the left paraspinal mass at the L3-4 level. The mass still present but has enhancement similar to that of adjacent psoas musculature. 2. No new metastatic disease is identified. 3. Other imaging findings of potential clinical significance: Cholelithiasis. Aortic Atherosclerosis (ICD10-I70.0). Sigmoid diverticulosis. Lumbar scoliosis.    05/09/2017 Imaging   Ct scan of abdomen and pelvis 1. Paraspinal mass adjacent to the left side of L3-L4 is less distinct than prior examinations, and is therefore difficult to discretely measure, however, the overall appearance suggests continued positive response to therapy. 2. No new signs of metastatic disease elsewhere in the abdomen or pelvis. 3. Aortic atherosclerosis, in addition to at least right  coronary artery disease. Assessment for potential risk factor modification, dietary therapy or pharmacologic therapy may be warranted, if clinically indicated. 4. Colonic diverticulosis without evidence of acute diverticulitis at this time. 5. Additional incidental findings, as above. Aortic Atherosclerosis (ICD10-I70.0).    08/14/2017 Imaging   1. Treated left paraspinal mass centered at L3-4. Mass size is stable but there has been some evolution of tumor characteristics with less central fluid seen today. Recommend continued surveillance. 2. No evidence of untreated metastasis. 3. Degenerative changes and related impingement are described above.    10/28/2017 Imaging   Stable small left paraspinal soft tissue mass. No new or progressive disease within the abdomen or pelvis.   Cholelithiasis.  No radiographic evidence of cholecystitis.   Colonic diverticulosis, without radiographic evidence of diverticulitis.    04/30/2018 Imaging   Stable post treatment change of the partially necrotic  LEFT paravertebral mass at L3-L4. 18 x 21 mm cross-section. Mass effect on the LEFT L3 nerve root redemonstrated. Enhancing and atrophic LEFT psoas is inseparable.   10/15/2018 Imaging   1. Stable post treatment size and appearance of partially necrotic left paravertebral mass at L3-4, measuring 19 x 25 mm on current exam (unchanged when measured at similar level on previous study). Mass effect on the adjacent left L3 nerve root is unchanged. Adjacent enhancing and atrophic left psoas muscle. 2. Left convex scoliosis with associated multilevel degenerative spondylolysis and facet arthrosis, unchanged   10/29/2018 Imaging   Stable small left L3 paraspinal soft tissue mass. No evidence of new or progressive metastatic disease, or other acute findings.   Cholelithiasis.  No radiographic evidence of cholecystitis.   Colonic diverticulosis, without radiographic evidence of diverticulitis.   11/11/2018 PET scan    1. Low level metabolism (max SUV 2.0) associated with the irregular subsolid superior segment right lower lobe 2.1 cm pulmonary nodule. As better depicted on the recent diagnostic chest CT study, this nodule crosses the major fissure into the right upper lobe and has increased in size and density on multiple imaging studies back to 2009. Findings are most suggestive of primary bronchogenic adenocarcinoma. 2. No hypermetabolic thoracic adenopathy. 3. Persistent hypermetabolism (max SUV 8.0) associated with the known left L3-4 paraspinous metastasis, mildly decreased in metabolism since 02/12/2016 PET-CT. 4. No additional sites of hypermetabolic metastatic disease in the abdomen or pelvis. No metabolic evidence of peritoneal recurrence. No ascites. 5. Chronic findings include: Aortic Atherosclerosis (ICD10-I70.0). Coronary atherosclerosis. Chronic bilateral maxillary sinusitis. Cholelithiasis. Moderate sigmoid diverticulosis.   01/07/2019 - 04/04/2019 Chemotherapy   The patient had carboplatin and Alimta for chemotherapy treatment.     05/03/2019 -  Chemotherapy   The patient had Pembrolizumab and Lenvima for chemotherapy treatment.     07/23/2019 PET scan   1. 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 No new sites of hypermetabolic metastatic disease on today's study. 2. Stable appearance of surgical changes in the right lung without features to suggest local recurrence or metastatic lung cancer. 3. Cholelithiasis. 4.  Aortic Atherosclerois (ICD10-170.0)   10/20/2019 Imaging   Bilateral venous Doppler US RIGHT:  - Findings consistent with acute and occlusive deep vein thrombosis involving the right common femoral vein, right external iliac vein, right femoral vein, right popliteal vein, right posterior tibial veins, and right peroneal veins.   Unable to adequately visualize the common iliac veins due to bowel gas. The imaged portions of  the IVC were patent.     LEFT:  - No evidence of common femoral vein obstruction.    11/08/2019 PET scan   1. Further reduction in activity at the left L3 level and left paraspinal tissues at L3. Maximum vertebral SUV currently 3.9, previously 4.8 on 07/23/2019 and previously 10.0 on 04/14/2019. 2. Late phase healing of previous left transverse process fractures at L3 and L4. Suspected healing right sixth rib fracture anteriorly. 3. There is new focal activity in the vicinity of the right internal jugular vein in the lower neck. Maximum SUV in this vicinity is 5.1. I not see a well-defined lymph node are thyroid lesion to corroborate with the focus, but surveillance in this region is suggested. 4. Other imaging findings of potential clinical significance: Aortic Atherosclerosis (ICD10-I70.0). Coronary atherosclerosis. Cholelithiasis. Sigmoid colon diverticulosis.   08/16/2020 PET scan   Signs of nodal recurrence in the LEFT pelvis as described.   Increased metabolic  activity at L3 without discrete visible lesion, subtle sclerosis in this area seen in the context of degenerative change. But seen also at the site of previous disease suspicious for disease recurrence and with metabolic activity considerably increased compared to previous imaging.   Signs of partial lung resection in the RIGHT chest as before.   Muscular activity in the bilateral abductor compartment of the upper thigh RIGHT greater than LEFT felt to be physiologic. Correlate with any symptoms in this location.   Aortic atherosclerosis.   08/28/2020 -  Chemotherapy    Patient is on Treatment Plan: UTERINE LENVATINIB + PEMBROLIZUMAB Q21D       Metastatic cancer to bone ([place])  06/27/2014 Initial Diagnosis   Metastatic cancer to bone    Primary lung cancer, right ([place])  11/04/2018 Initial Diagnosis   Primary lung cancer, right ([place])   11/11/2018 PET scan   1. Low level metabolism (max SUV 2.0) associated with the irregular  subsolid superior segment right lower lobe 2.1 cm pulmonary nodule. As better depicted on the recent diagnostic chest CT study, this nodule crosses the major fissure into the right upper lobe and has increased in size and density on multiple imaging studies back to 2009. Findings are most suggestive of primary bronchogenic adenocarcinoma. 2. No hypermetabolic thoracic adenopathy. 3. Persistent hypermetabolism (max SUV 8.0) associated with the known left L3-4 paraspinous metastasis, mildly decreased in metabolism since 02/12/2016 PET-CT. 4. No additional sites of hypermetabolic metastatic disease in the abdomen or pelvis. No metabolic evidence of peritoneal recurrence. No ascites. 5. Chronic findings include: Aortic Atherosclerosis (ICD10-I70.0). Coronary atherosclerosis. Chronic bilateral maxillary sinusitis. Cholelithiasis. Moderate sigmoid diverticulosis.   12/07/2018 Pathology Results   1. Lung, resection (segmental or lobe), Right Lobe Superior with endblock wedge of right upper lobe - INVASIVE ADENOCARCINOMA, MODERATELY DIFFERENTIATED, SPANNING 1.6 CM. - THE SURGICAL RESECTION MARGINS ARE NEGATIVE FOR CARCINOMA. - SEE ONCOLOGY TABLE BELOW. 2. Lymph node, biopsy, Level 9 #1 - THERE IS NO EVIDENCE OF CARCINOMA IN 1 OF 1 LYMPH NODE (0/1). 3. Lymph node, biopsy, Level 9 #2 - THERE IS NO EVIDENCE OF CARCINOMA IN 1 OF 1 LYMPH NODE (0/1). 4. Lymph node, biopsy, Level 7 #1 - METASTATIC CARCINOMA IN 1 OF 1 LYMPH NODE (1/1). 5. Lymph node, biopsy, Level 7 #2 - THERE IS NO EVIDENCE OF CARCINOMA IN 1 OF 1 LYMPH NODE (0/1). 6. Lymph node, biopsy, Level 12 #1 - THERE IS NO EVIDENCE OF CARCINOMA IN 1 OF 1 LYMPH NODE (0/1). 7. Lymph node, biopsy, Level 12 #2 - THERE IS NO EVIDENCE OF CARCINOMA IN 1 OF 1 LYMPH NODE (0/1). 8. Lymph node, biopsy, Level 10 #1 - THERE IS NO EVIDENCE OF CARCINOMA IN 1 OF 1 LYMPH NODE (0/1). 9. Lymph node, biopsy, Level 4R #1 - THERE IS NO EVIDENCE OF CARCINOMA IN 1 OF 1  LYMPH NODE (0/1). 10. Lymph node, biopsy, Level 4R #2 - THERE IS NO EVIDENCE OF CARCINOMA IN 1 OF 1 LYMPH NODE (0/1). 11. Lymph node, biopsy, Level 2R #1 - THERE IS NO EVIDENCE OF CARCINOMA IN 1 OF 1 LYMPH NODE (0/1). 12. Lung, resection (segmental or lobe), Right Lobe Superior - BENIGN LUNG PARENCHYMA. - THERE IS NO EVIDENCE OF MALIGNANCY. Procedure: Segmentectomy. Specimen Laterality: Right. Tumor Site: Right superior lobe. Tumor Size: 1.6 cm (gross measurement). Tumor Focality: Unifocal. Histologic Type: Adenocarcinoma, moderately differentiated. Visceral Pleura Invasion: Not identified. Lymphovascular Invasion: Not identified. Direct Invasion of Adjacent Structures: Not identified. Margins: Negative for carcinoma. Treatment Effect:  N/A Regional Lymph Nodes: Number of Lymph Nodes Involved: 1 (level 7) Number of Lymph Nodes Examined: 10 Pathologic Stage Classification (pTNM, AJCC 8th Edition): pT1b, pN2 Ancillary Studies: The tumor cells are positive for Napsin-A, TTF-1, and p53. They are essentially negative for estrogen receptor, PAX-8 and progesterone receptor. Additional studies can be performed upon clinician request. Representative Tumor Block: 1D-1E. (JBK:gt, 12/09/18)   12/07/2018 Surgery   PREOPERATIVE DIAGNOSIS:  Right lung nodule.   POSTOPERATIVE DIAGNOSIS:  Non-small cell carcinoma- suspected primary lung carcinoma, clinical stage T2N01b.   PROCEDURE:   Right video-assisted thoracoscopy, Right lower lobe superior segmentectomy with en bloc wedge resection of right upper lobe, Lymph node dissection, Intercostal nerve block levels 3-9.   SURGEON:  Modesto Charon, MD   FINDINGS:  Mass palpable in posterior aspect of superior segment of right lower lobe, likely involvement across fissure into the upper lobe.  Frozen section revealed non-small cell carcinoma. upper and lower lobe margins clear.   12/23/2018 Cancer Staging   Staging form: Lung, AJCC 8th Edition -  Pathologic: Stage IIIA (pT1b, pN2, cM0) - Signed by Heath Lark, MD on 12/23/2018    01/05/2019 Imaging   MRI brain 1. No metastatic disease or acute intracranial abnormality identified. 2. Small chronic parafalcine meningioma size is stable since that described in 2006 (12 x 15 mm). 3. Advanced signal changes in the cerebral white matter and to a lesser extent pons, nonspecific but most commonly due to chronic small vessel disease. 4. Partially visible degenerative cervical spinal stenosis.     01/07/2019 -  Chemotherapy   The patient had carboplatin and Alimta for chemotherapy treatment.     04/14/2019 PET scan   1. 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 As shown on recent MRI of 04/01/2019, there is a new component of invasion into the left lower L3 vertebral body. This new component has a maximum SUV of 10.0, compatible with active malignancy. 2. Interval wedge resection of portions of the right upper lobe and right lower lobe at the site of the prior nodule. There is only minimal low-grade activity along the wedge resection clips, maximum SUV of 1.5. Surveillance is likely warranted. No new nodule identified. 3. Other imaging findings of potential clinical significance: Aortic Atherosclerosis (ICD10-I70.0). Coronary atherosclerosis. Thoracolumbar scoliosis. Cholelithiasis. Sigmoid colon diverticulosis.     05/03/2019 -  Chemotherapy   The patient had Pembrolizumab and Lenvima for chemotherapy treatment.     05/22/2019 Imaging   Ct head Atrophy, chronic microvascular disease.   No acute intracranial abnormality.     03/09/2020 PET scan   1. No evidence of malignant disease progression. 2. Similar mild metabolic activity within at the sclerotic L3 vertebral body lesion. Reduction in paraspinal muscular metabolic activity at L3 vertebral body level.     REVIEW OF SYSTEMS:   Constitutional: Denies fevers, chills or abnormal weight loss Eyes: Denies  blurriness of vision Ears, nose, mouth, throat, and face: Denies mucositis or sore throat Respiratory: Denies cough, dyspnea or wheezes Cardiovascular: Denies palpitation, chest discomfort or lower extremity swelling Gastrointestinal:  Denies nausea, heartburn or change in bowel habits Skin: Denies abnormal skin rashes Lymphatics: Denies new lymphadenopathy or easy bruising Neurological:Denies numbness, tingling or new weaknesses Behavioral/Psych: Mood is stable, no new changes  All other systems were reviewed with the patient and are negative.  I have reviewed the past medical history, past surgical history, social history and family history with the patient and they are unchanged from previous note.  ALLERGIES:  is allergic to ace inhibitors, dilaudid [hydromorphone hcl], and codeine.  MEDICATIONS:  Current Outpatient Medications  Medication Sig Dispense Refill   oxyCODONE (OXY IR/ROXICODONE) 5 MG immediate release tablet Take 1 tablet (5 mg total) by mouth every 6 (six) hours as needed for severe pain. 60 tablet 0   Potassium 99 MG TABS Take 1 tablet by mouth daily.     atenolol (TENORMIN) 25 MG tablet Take 25 mg by mouth 2 (two) times daily.     BIOTIN PO Take 500 mcg by mouth daily.     calcium carbonate (TUMS - DOSED IN MG ELEMENTAL CALCIUM) 500 MG chewable tablet Chew 1 tablet by mouth daily. 1200 mg daily     cetirizine (ZYRTEC) 10 MG tablet Take 10 mg by mouth daily.     cholecalciferol (VITAMIN D3) 25 MCG (1000 UNIT) tablet Take 10,000 Units by mouth daily.     donepezil (ARICEPT) 5 MG tablet Take 1 tablet (5 mg total) by mouth at bedtime. 30 tablet 5   furosemide (LASIX) 20 MG tablet Take 1 tablet (20 mg total) by mouth daily. (Patient taking differently: Take 20 mg by mouth daily. Take 2 tabs on a Tuesday/Thursday and Saturday) 10 tablet 0   hydrALAZINE (APRESOLINE) 25 MG tablet Take 25 mg by mouth daily. One tablet Mid afternoon     hydrALAZINE (APRESOLINE) 50 MG tablet Take  50 mg by mouth 2 (two) times daily.     lenvatinib 10 mg daily dose (LENVIMA) capsule Take 1 capsule (10 mg total) by mouth daily. 30 capsule 11   levothyroxine (SYNTHROID) 50 MCG tablet Take 1 tablet (50 mcg total) by mouth daily before breakfast. 30 tablet 3   memantine (NAMENDA) 5 MG tablet TAKE 2 TABLETS BY MOUTH TWICE A DAY 360 tablet 1   Multiple Vitamin (MULTIVITAMIN WITH MINERALS) TABS tablet Take 1 tablet by mouth daily.     rivaroxaban (XARELTO) 10 MG TABS tablet Take 1 tablet (10 mg total) by mouth daily. 30 tablet 11   simvastatin (ZOCOR) 20 MG tablet Take 20 mg by mouth every evening.      No current facility-administered medications for this visit.   Facility-Administered Medications Ordered in Other Visits  Medication Dose Route Frequency Provider Last Rate Last Admin   heparin lock flush 100 unit/mL  500 Units Intracatheter Once Alvy Bimler, Renji Berwick, MD       sodium chloride flush (NS) 0.9 % injection 10 mL  10 mL Intracatheter Once Alvy Bimler, Kimra Kantor, MD        PHYSICAL EXAMINATION: ECOG PERFORMANCE STATUS: 1 - Symptomatic but completely ambulatory  Vitals:   10/31/20 0911  BP: (!) 177/88  Pulse: 79  Resp: 18  Temp: 98 F (36.7 C)  SpO2: 99%   Filed Weights   10/31/20 0911  Weight: 120 lb 12.8 oz (54.8 kg)    GENERAL:alert, no distress and comfortable SKIN: skin color, texture, turgor are normal, no rashes or significant lesions EYES: normal, Conjunctiva are pink and non-injected, sclera clear OROPHARYNX:no exudate, no erythema and lips, buccal mucosa, and tongue normal  NECK: supple, thyroid normal size, non-tender, without nodularity LYMPH:  no palpable lymphadenopathy in the cervical, axillary or inguinal LUNGS: clear to auscultation and percussion with normal breathing effort HEART: regular rate & rhythm and no murmurs with mild bilateral lower extremity edema ABDOMEN:abdomen soft, non-tender and normal bowel sounds Musculoskeletal:no cyanosis of digits and no clubbing   NEURO: alert & oriented x 3 with fluent speech, no focal motor/sensory  deficits  LABORATORY DATA:  I have reviewed the data as listed    Component Value Date/Time   NA 140 10/31/2020 0852   NA 140 05/09/2017 0844   K 3.3 (L) 10/31/2020 0852   K 4.1 05/09/2017 0844   CL 103 10/31/2020 0852   CO2 29 10/31/2020 0852   CO2 26 05/09/2017 0844   GLUCOSE 118 (H) 10/31/2020 0852   GLUCOSE 82 05/09/2017 0844   BUN 25 (H) 10/31/2020 0852   BUN 20.7 05/09/2017 0844   CREATININE 1.19 (H) 10/31/2020 0852   CREATININE 0.7 05/09/2017 0844   CALCIUM 9.7 10/31/2020 0852   CALCIUM 9.3 05/09/2017 0844   PROT 7.1 10/31/2020 0852   PROT 6.8 05/09/2017 0844   ALBUMIN 3.9 10/31/2020 0852   ALBUMIN 3.6 05/09/2017 0844   AST 41 10/31/2020 0852   AST 27 05/09/2017 0844   ALT 29 10/31/2020 0852   ALT 17 05/09/2017 0844   ALKPHOS 52 10/31/2020 0852   ALKPHOS 43 05/09/2017 0844   BILITOT 1.2 10/31/2020 0852   BILITOT 0.81 05/09/2017 0844   GFRNONAA 46 (L) 10/31/2020 0852   GFRAA >60 02/07/2020 1109    No results found for: SPEP, UPEP  Lab Results  Component Value Date   WBC 6.7 10/31/2020   NEUTROABS 3.9 10/31/2020   HGB 13.7 10/31/2020   HCT 41.1 10/31/2020   MCV 97.2 10/31/2020   PLT 155 10/31/2020      Chemistry      Component Value Date/Time   NA 140 10/31/2020 0852   NA 140 05/09/2017 0844   K 3.3 (L) 10/31/2020 0852   K 4.1 05/09/2017 0844   CL 103 10/31/2020 0852   CO2 29 10/31/2020 0852   CO2 26 05/09/2017 0844   BUN 25 (H) 10/31/2020 0852   BUN 20.7 05/09/2017 0844   CREATININE 1.19 (H) 10/31/2020 0852   CREATININE 0.7 05/09/2017 0844      Component Value Date/Time   CALCIUM 9.7 10/31/2020 0852   CALCIUM 9.3 05/09/2017 0844   ALKPHOS 52 10/31/2020 0852   ALKPHOS 43 05/09/2017 0844   AST 41 10/31/2020 0852   AST 27 05/09/2017 0844   ALT 29 10/31/2020 0852   ALT 17 05/09/2017 0844   BILITOT 1.2 10/31/2020 0852   BILITOT 0.81 05/09/2017 0844

## 2020-10-31 NOTE — Assessment & Plan Note (Signed)
She has uncontrolled hypertension today She have heavy proteinuria According to her husband, he relies on her to check her blood pressure but the patient could not recall her blood pressure readings I recommend she continue to check her blood pressure daily and report to her primary care doctor for medication adjustment For today, we will hold off Lenvima

## 2020-10-31 NOTE — Assessment & Plan Note (Signed)
She has significant cognitive decline I spoke with her primary care doctor She is on Namenda She has appointment to see neurologist next month I am concerned about her compliance to medications

## 2020-10-31 NOTE — Assessment & Plan Note (Signed)
She tolerated treatment very poorly Based on the interaction today with her husband, I am wondering whether the patient is taking her medications correctly I alerted her primary care doctor about our conversation today Due to heavy proteinuria, I told the husband to discontinue Lenvima We will continue pembrolizumab only today We will schedule PET CT scan before her next cycle of treatment for objective assessment of response to therapy

## 2020-10-31 NOTE — Assessment & Plan Note (Signed)
I do not recommend her husband to share his medication with the patient I gave her a prescription of oxycodone to take as needed We discussed narcotic refill policy and I reminded the patient and her husband risk of constipation while taking pain medicine I will reassess pain control in her next visit

## 2020-11-01 ENCOUNTER — Ambulatory Visit: Payer: Medicare Other

## 2020-11-01 ENCOUNTER — Other Ambulatory Visit: Payer: Medicare Other

## 2020-11-01 LAB — T4: T4, Total: 8.2 ug/dL (ref 4.5–12.0)

## 2020-11-02 ENCOUNTER — Ambulatory Visit: Payer: Medicare Other | Admitting: Hematology and Oncology

## 2020-11-08 ENCOUNTER — Other Ambulatory Visit (HOSPITAL_COMMUNITY): Payer: Self-pay

## 2020-11-10 ENCOUNTER — Encounter: Payer: Self-pay | Admitting: Hematology and Oncology

## 2020-11-14 ENCOUNTER — Ambulatory Visit: Payer: Medicare Other | Admitting: Allergy

## 2020-11-15 ENCOUNTER — Other Ambulatory Visit: Payer: Self-pay

## 2020-11-15 ENCOUNTER — Ambulatory Visit (HOSPITAL_COMMUNITY)
Admission: RE | Admit: 2020-11-15 | Discharge: 2020-11-15 | Disposition: A | Discharge status: Home / Self Care | Payer: Medicare Other | Source: Ambulatory Visit | Attending: Hematology and Oncology | Admitting: Hematology and Oncology

## 2020-11-15 ENCOUNTER — Other Ambulatory Visit (HOSPITAL_COMMUNITY): Payer: Self-pay

## 2020-11-15 DIAGNOSIS — C541 Malignant neoplasm of endometrium: Secondary | ICD-10-CM | POA: Diagnosis not present

## 2020-11-15 DIAGNOSIS — Z79899 Other long term (current) drug therapy: Secondary | ICD-10-CM | POA: Insufficient documentation

## 2020-11-15 DIAGNOSIS — C3491 Malignant neoplasm of unspecified part of right bronchus or lung: Secondary | ICD-10-CM | POA: Insufficient documentation

## 2020-11-15 DIAGNOSIS — C3490 Malignant neoplasm of unspecified part of unspecified bronchus or lung: Secondary | ICD-10-CM | POA: Diagnosis not present

## 2020-11-15 LAB — GLUCOSE, CAPILLARY: Glucose-Capillary: 92 mg/dL (ref 70–99)

## 2020-11-15 MED ORDER — FLUDEOXYGLUCOSE F - 18 (FDG) INJECTION
7.0000 | Freq: Once | INTRAVENOUS | Status: AC | PRN
  Administered 2020-11-15: 6.73 via INTRAVENOUS

## 2020-11-17 ENCOUNTER — Other Ambulatory Visit: Payer: Self-pay

## 2020-11-17 ENCOUNTER — Encounter: Payer: Self-pay | Admitting: Hematology and Oncology

## 2020-11-17 ENCOUNTER — Inpatient Hospital Stay: Payer: Medicare Other | Attending: Hematology and Oncology | Admitting: Hematology and Oncology

## 2020-11-17 VITALS — BP 154/63 | HR 74 | Temp 98.9°F | Resp 19 | Ht 58.5 in | Wt 122.6 lb

## 2020-11-17 DIAGNOSIS — Z885 Allergy status to narcotic agent status: Secondary | ICD-10-CM | POA: Diagnosis not present

## 2020-11-17 DIAGNOSIS — M48061 Spinal stenosis, lumbar region without neurogenic claudication: Secondary | ICD-10-CM | POA: Insufficient documentation

## 2020-11-17 DIAGNOSIS — J320 Chronic maxillary sinusitis: Secondary | ICD-10-CM | POA: Insufficient documentation

## 2020-11-17 DIAGNOSIS — Z171 Estrogen receptor negative status [ER-]: Secondary | ICD-10-CM | POA: Insufficient documentation

## 2020-11-17 DIAGNOSIS — M47816 Spondylosis without myelopathy or radiculopathy, lumbar region: Secondary | ICD-10-CM | POA: Diagnosis not present

## 2020-11-17 DIAGNOSIS — Z79899 Other long term (current) drug therapy: Secondary | ICD-10-CM | POA: Diagnosis not present

## 2020-11-17 DIAGNOSIS — J984 Other disorders of lung: Secondary | ICD-10-CM | POA: Insufficient documentation

## 2020-11-17 DIAGNOSIS — K5730 Diverticulosis of large intestine without perforation or abscess without bleeding: Secondary | ICD-10-CM | POA: Insufficient documentation

## 2020-11-17 DIAGNOSIS — M47817 Spondylosis without myelopathy or radiculopathy, lumbosacral region: Secondary | ICD-10-CM | POA: Diagnosis not present

## 2020-11-17 DIAGNOSIS — I10 Essential (primary) hypertension: Secondary | ICD-10-CM | POA: Insufficient documentation

## 2020-11-17 DIAGNOSIS — C3431 Malignant neoplasm of lower lobe, right bronchus or lung: Secondary | ICD-10-CM | POA: Insufficient documentation

## 2020-11-17 DIAGNOSIS — G3184 Mild cognitive impairment, so stated: Secondary | ICD-10-CM | POA: Insufficient documentation

## 2020-11-17 DIAGNOSIS — G893 Neoplasm related pain (acute) (chronic): Secondary | ICD-10-CM | POA: Insufficient documentation

## 2020-11-17 DIAGNOSIS — M2578 Osteophyte, vertebrae: Secondary | ICD-10-CM | POA: Diagnosis not present

## 2020-11-17 DIAGNOSIS — Z5112 Encounter for antineoplastic immunotherapy: Secondary | ICD-10-CM | POA: Diagnosis not present

## 2020-11-17 DIAGNOSIS — C7951 Secondary malignant neoplasm of bone: Secondary | ICD-10-CM | POA: Insufficient documentation

## 2020-11-17 DIAGNOSIS — I2510 Atherosclerotic heart disease of native coronary artery without angina pectoris: Secondary | ICD-10-CM | POA: Insufficient documentation

## 2020-11-17 DIAGNOSIS — N1831 Chronic kidney disease, stage 3a: Secondary | ICD-10-CM | POA: Diagnosis not present

## 2020-11-17 DIAGNOSIS — I700 Atherosclerosis of aorta: Secondary | ICD-10-CM | POA: Insufficient documentation

## 2020-11-17 DIAGNOSIS — M4316 Spondylolisthesis, lumbar region: Secondary | ICD-10-CM | POA: Insufficient documentation

## 2020-11-17 DIAGNOSIS — Z7901 Long term (current) use of anticoagulants: Secondary | ICD-10-CM | POA: Insufficient documentation

## 2020-11-17 DIAGNOSIS — K8020 Calculus of gallbladder without cholecystitis without obstruction: Secondary | ICD-10-CM | POA: Insufficient documentation

## 2020-11-17 DIAGNOSIS — M549 Dorsalgia, unspecified: Secondary | ICD-10-CM | POA: Diagnosis not present

## 2020-11-17 DIAGNOSIS — M5136 Other intervertebral disc degeneration, lumbar region: Secondary | ICD-10-CM | POA: Insufficient documentation

## 2020-11-17 DIAGNOSIS — C55 Malignant neoplasm of uterus, part unspecified: Secondary | ICD-10-CM | POA: Diagnosis not present

## 2020-11-17 DIAGNOSIS — C541 Malignant neoplasm of endometrium: Secondary | ICD-10-CM | POA: Insufficient documentation

## 2020-11-17 DIAGNOSIS — I129 Hypertensive chronic kidney disease with stage 1 through stage 4 chronic kidney disease, or unspecified chronic kidney disease: Secondary | ICD-10-CM | POA: Diagnosis not present

## 2020-11-17 MED ORDER — OXYCODONE HCL 5 MG PO TABS: 5.0000 mg | ORAL_TABLET | Freq: Four times a day (QID) | ORAL | 0 refills | Status: DC | PRN

## 2020-11-17 NOTE — Progress Notes (Signed)
[place] OFFICE PROGRESS NOTE  Patient Care Team: Shon Baton, MD as PCP - General (Internal Medicine) Heath Lark, MD as Consulting Physician (Hematology and Oncology)  ASSESSMENT & PLAN:  Endometrial cancer [facility]) I have reviewed her imaging studies with the patient and her husband Overall, she had positive response to treatment Radiologist commented increase activity in the lumbar spine Some time, bone healing can also show increased activity on the PET CT scan but not necessarily means disease progression I recommend we continue treatment as scheduled next week Due to her recent uncontrolled hypertension, I recommend changing her Lenvima to every other day  I will order MRI of her lumbar spine for further evaluation  Metastatic cancer to bone ([place]) As above, I plan to order MRI for further evaluation In the meantime, she will continue pain medicine as needed  Cancer associated pain Her pain is well controlled I refilled her prescription oxycodone I reminded her side effects including sedation and constipation  Essential hypertension Her blood pressure is better controlled We will resume Lenvima at every other day dose until her next visit  Orders Placed This Encounter  Procedures   MR LUMBAR SPINE W WO CONTRAST    Standing Status:   Future    Standing Expiration Date:   11/17/2021    Order Specific Question:   If indicated for the ordered procedure, I authorize the administration of contrast media per Radiology protocol    Answer:   Yes    Order Specific Question:   What is the patient's sedation requirement?    Answer:   No Sedation    Order Specific Question:   Does the patient have a pacemaker or implanted devices?    Answer:   No    Order Specific Question:   Use SRS Protocol?    Answer:   Yes    Order Specific Question:   Preferred imaging location?    Answer:   [facility] (table limit - 550 lbs)    All questions were answered. The  patient knows to call the clinic with any problems, questions or concerns. The total time spent in the appointment was 30 minutes encounter with patients including review of chart and various tests results, discussions about plan of care and coordination of care plan   Heath Lark, MD 11/17/2020 3:39 PM  INTERVAL HISTORY: Please see below for problem oriented charting. She returns with her husband for further follow-up She is doing better since last time I saw her I have reviewed documentation of her blood pressure In general, her blood pressure at home measured between 107 to as high as 120s Occasionally, her blood pressure is noted to be low Her pain on her back is better controlled with oxycodone as needed She does not take her pain medicine on a daily basis She has no recent constipation  SUMMARY OF ONCOLOGIC HISTORY: Oncology History Overview Note  Biopsy of recurrence 06-2014 ER negative. PDL1 testing low at 2% on the uterine cancer She progressed on carboplatin and Paclitaxel for uterine cancer HER 2 negative on pathology [id number] Negative genetic testing  On her lung cancer sample, [facility] testing revealed 10% PD-1 positive EGFR mutation was detected, exon 19 deletion   Endometrial cancer ([place])  12/01/2007 Initial Diagnosis   She has recurrent papillary serous endometrial carcinoma. Briefly she was diagnosed in 2009 with a FIGO IB pappilary serous carcinoma treated with hysterectomy followed by adjuvant chemo and vaginal brachytherapy. She then had a recurrence  diagnosed in late 2012 that was deemed not to be surgically resectable, and was treated with 6 cycles of carbo/taxol followed by 5040 cGy of EBRT to the pelvic mass. She was then followed with serial imaging and was noted in June of this year to have slight increase in the size of the mass, and again in September there was small enlargement of the mass. She had a re-staging PET scan on 03/10/13 that showed FDG activity  in the pelvic mass, with no other areas of disease    12/01/2007 Imaging   CT scan of chest, abdomen and pelvis: 1.  Mass like area of decreased attenuation in the central uterus, consistent with the known endometrial carcinoma.  Deep myometrial invasion is suspected.  Pelvic MRI without and with contrast could be performed for further staging workup if clinically warranted. 2.  No evidence of extrauterine extension or lymphadenopathy. 3.  Sigmoid diverticulosis incidentally noted.    12/15/2007 Surgery   --12/2007 laparoscopic hysterectomy and PLND --05/2008 complted 6 cycles adjuvant carbo/taxol followed by vaginal cuff brachy --03/2011 exploratory lararoscopy, ureterolysis --4/13 completed 6 cycels carbotaxol --8/13 completed EBRT to the left pelvic sidewall    06/10/2008 Imaging   CT scan abdomen and pelvis 1.  Nonspecific mildly prominent inguinal lymph nodes, right greater than left. 2.  Interval hysterectomy without evidence of recurrent pelvic mass or fluid collection.    03/22/2011 Imaging   Ct scan abdomen and pelvis 1.  Local uterine cancer recurrence with two large nodes along the left pelvic sidewall. 2.  No evidence of bowel obstruction, urinary obstruction, or more distant metastasis.    03/31/2011 Relapse/Recurrence   chemotherapy and external beam radiation    05/16/2011 - 09/17/2011 Chemotherapy   She had 6 cycles of carboplatin and Taxol     07/17/2011 Imaging   Ct scan abdomen and pelvis 1.  Interval improvement in the previously demonstrated left pelvic local recurrence of tumor. 2.  No disease progression or complication identified. 3.  Mild bladder wall thickening on the right, nonspecific and possibly related to incomplete distension and/or radiation therapy. 4.  Cholelithiasis    10/11/2011 Imaging   CT scan abdomen and pelvis 1.  Interval enlargement of the left pelvic sidewall masses. 2.  No new nodular disease in the pelvis or lymphadenopathy. 3.  No  evidence  of distant metastasis. 4.  Mild hydronephrosis on the right is similar to prior. 5.  Focal thickening of the right aspect the bladder is stable compared to prior.      10/28/2011 - 12/05/2011 Radiation Therapy   radiation for pelvic sidewall recurrence    10/28/2011 - 12/05/2011 Radiation Therapy   10/28/11-12/05/11: Radiotherapy to the left pelvic sidewall    03/18/2012 Imaging   CT abdomen 1.  Today's study demonstrates a positive response to therapy with decreased size of left pelvic sidewall nodal masses, as detailed above.  No new soft tissue masses or new lymphadenopathy is identified within the abdomen or pelvis. Continue attention on follow-up studies is recommended. 2.  Cholelithiasis without findings to suggest acute cholecystitis.  3.  Colonic diverticulosis without findings to suggest acute diverticulitis at this time. 4.  Mild asymmetric urinary bladder wall thickening is unchanged compared to prior examinations and is nonspecific.  No definite bladder wall mass is identified at this time. 5.  Additional findings, similar to prior examinations, as above    06/22/2012 Imaging   CT abdomen 1.  Further decreased size of the left pelvic peripherally enhancing lesion.  2.  No new sites of new or progressive disease. 3. Esophageal air fluid level suggests dysmotility or gastroesophageal reflux. 4.  Cholelithiasis    11/02/2012 Imaging   CT abdomen 1.  The left pelvic side wall lesion demonstrates mild increase in size from previous exam. 2.  No new sites of new or progressive disease. 3.  Cholelithiasis.      01/28/2013 Imaging   CT abdomen 1. Interval small increase in volume of enhancing mass adjacent to the left pelvic sidewall. 2. No evidence of abdominal or pelvic lymphadenopathy    04/02/2013 Surgery   pelvic mass resection with IORT    04/02/2013 - 04/02/2013 Radiation Therapy   04/02/13: Intraoperative radiotherapy to the left pelvis    05/10/2013  PET scan   1. Hypermetabolic mass in the deep left pelvis consistent with metastasis. 2. No additional evidence of local metastasis. No evidence of distant metastasis. 3. Small right lower lobe pulmonary nodule is not hypermetabolic.    07/27/2013 Imaging   No evidence of recurrent or metastatic disease. Apparent prior resection of the deep left pelvic metastasis.    02/08/2014 Imaging   No CT findings for recurrent or metastatic disease involving the abdomen/ pelvis    06/13/2014 Relapse/Recurrence   + recurrence paraspinous muscle    06/21/2014 Procedure   There is a soft tissue lesion along the left side of L3-L4. This lesion roughly measures up to 2.2 cm. Needle was positioned along the posterior aspect of the lesion. Small amount of air in the paraspinal tissues following needle removal    06/21/2014 Pathology Results   Bone, biopsy, left lumbar paraspinal - METASTATIC POORLY DIFFERENTIATED CARCINOMA, CONSISTENT WITH HIGH GRADE SEROUS CARCINOMA SEE COMMENT. Microscopic Comment The carcinoma demonstrates the following immunophenotype: Cytokeratin 7 - patchy moderate to strong expression Estrogen receptor - negative expression P53 - strong diffuse expression TTF-1 - negative expression WT-1 - negative expression CD56 - focal moderate strong expression Synaptophysin - negative expression GCDFP - negative expression The history of primary endometrial papillary serous carcinoma and primary mammary carcinoma is noted. In the current case, the overall morphologic and immunophenotype are that of poorly differentiated carcinoma, consistent with high grade serous carcinoma.     07/11/2014 - 08/12/2014 Radiation Therapy   07/11/14-08/12/14: 55 Gy in 25 fractions to the Lumbar spine    08/15/2014 Imaging   CT scan of chest, abdomen and pelvis 1. Since the biopsy study of 06/21/2014, similar to slight decrease in size of a left paraspinous lesion at L3-4. 2. No new sites of metastatic disease  identified. 3. 8 mm ground-glass nodule in the right lower lobe is grossly similar to 03/10/2013, suggesting a benign etiology. 4. Cholelithiasis. 5. Apparent sigmoid colonic wall thickening which could be due to underdistention. Colitis felt less likely. 6.  Atherosclerosis, including within the coronary arteries. 7. Pelvic floor laxity.    10/07/2014 Imaging   CT scan of chest, abdomen and pelvis: Stable to slight interval decrease in size of left paraspinous lesion at L3-4. No new sites of metastatic disease. Unchanged ground-glass nodule within the right lower lobe, potentially benign in etiology. Recommend attention on followup. Cholelithiasis. Sigmoid colonic diverticulosis. No CT evidence to suggest acute diverticulitis.    01/19/2015 Imaging   Ct scan of abdomen and pelvis 1. Decrease in size of left paraspinous metastasis. 2. No new sites of disease. 3. Stable ground-glass attenuating nodule within the superior segment of right lower lobe. 4. Gallstones    05/02/2015 Imaging   Ct abdomen  and pelvis: Slight interval increase in size of left lumbar paraspinal soft tissue metastasis. No new sites of metastatic disease identified within the abdomen or pelvis.      05/11/2015 PET scan   1. The small soft tissue mass just lateral to the left L3 for neural foramen is hypermetabolic, favoring malignancy. 2. There is also a small focus of hypermetabolic activity between the left L1 and L2 transverse processes, but without a CT correlate. 3. The 13 mm sub solid nodule in the superior segment right lower lobe is stable from recent exams but has slowly increased in size over the last 7 years. Although not hypermetabolic, the appearance is concerning for the possibility of a low grade adenocarcinoma. 4. Other imaging findings of potential clinical significance: Chronic bilateral maxillary sinusitis. Coronary, aortic arch, and branch vessel atherosclerotic vascular disease. Aortoiliac  atherosclerotic vascular disease. Cholelithiasis. Sigmoid colon diverticulosis. Lumbar scoliosis.    07/11/2015 Imaging   Ct scan of chest, abdomen and pelvis: 1. 13 mm mixed solid and sub solid nodule in the posterior right lower lobe was present in 2009 and has clearly progressed in the interval since that study. Imaging features remain highly concerning for low-grade or well differentiated adenocarcinoma. 2. Slight increase size of in the hypermetabolic, rim enhancing nodule identified adjacent to the left L3-4 neural foramen. Metastatic disease remains a concern. 3. No evidence for discrete soft tissue lesion between the left L1 and 2 transverse processes, the site of focal FDG uptake on the recent PET-CT. 4. Cholelithiasis. 5. Abdominal aortic atherosclerosis    08/03/2015 - 01/16/2016 Chemotherapy   She received carboplatin and Taxol   11/06/2015 PET scan   1. Hypermetabolic left L3-4 paraspinous metastasis (biopsy-proven) is stable in size and slightly decreased in metabolism. 2. Previously described small focus of hypermetabolism between the left L2 and L3 spinous processes has resolved. 3. New mild linear hypermetabolism to the left of the T11-12 spinous processes without discrete mass on the CT images, favor benign activity related activity, recommend attention on follow-up PET-CT.  4. No definite new sites of hypermetabolic metastatic disease. No recurrent hypermetabolic metastatic disease in the pelvis. 5. Interval stability of subsolid 1.3 cm superior segment right lower lobe pulmonary nodule without associated significant metabolism, which has grown compared to the 2009 chest CT study, and remain suspicious for low grade adenocarcinoma. 6. Additional findings include aortic atherosclerosis, coronary atherosclerosis, mild multinodular goiter with no hypermetabolic thyroid nodules, cholelithiasis and moderate sigmoid diverticulosis.    02/12/2016 PET scan   1. Interval increase in size  and metabolic activity of the LEFT paraspinal soft tissue metastasis. 2. New activity within the musculature of the LEFT chest wall. Given the unusual location of the paraspinal metastasis cannot exclude a second soft tissue metastasis to the musculature however favor benign physiologic activity. 3. Stable RIGHT lower lobe pulmonary nodule. 4. No evidence of local recurrence.        04/08/2016 Imaging   Ct scan of chest, abdomen and pelvis: 1. Similar size of a  left paravertebral abdominal lesion. 2. No new or progressive metastatic disease. 3. No correlate for the muscular activity about the lateral left chest wall. 4.  Coronary artery atherosclerosis. Aortic atherosclerosis. 5. Cholelithiasis. 6. Similar right lower lobe pulmonary nodule.    06/03/2016 Imaging   CT abdomen and pelvis 1. Similar to mild enlargement of a paravertebral soft tissue lesion at the L3-4 level. 2. No new sites of disease identified. 3. Cholelithiasis. 4. Hysterectomy. 5.  Aortic atherosclerosis.  09/02/2016 Imaging   CT abdomen and pelvis 1. Continue mild interval increase in size of LEFT paraspinal mass (1-2 mm). 2. No evidence of new metastatic disease in the abdomen pelvis. 3. Post hysterectomy anatomy    09/26/2016 Imaging   MR lumbar spine 1. Left paraspinal metastasis with epicenter adjacent to the left L3 neural foramen does extend into the foramen to the level of the left lateral epidural space (series 9, image 31), but also tracks cephalad and caudal along the L2-L4 lumbar plexus (series 10, image 15). No extension into the left L2 or L4 neural foramina. And no more distal extension of tumor. 2. Abnormal signal in the medial left psoas and ventral left erector spinae muscles at L2 and L3 is probably denervation related. 3. No bone invasion or osseous metastatic disease. Suspect previous radiation of the L2 through L5 spinal levels. 4. No dural or intradural metastatic disease.       10/22/2016 Procedure   She underwent stereotactic Radiosurgery    10/30/2016 Genetic Testing   Patient has genetic testing done for Inheritable genetic mutation panel. Results revealed patient has no actionable mutation.    01/21/2017 Imaging   1. Reduced size and conspicuity of the left paraspinal mass at the L3-4 level. The mass still present but has enhancement similar to that of adjacent psoas musculature. 2. No new metastatic disease is identified. 3. Other imaging findings of potential clinical significance: Cholelithiasis. Aortic Atherosclerosis (ICD10-I70.0). Sigmoid diverticulosis. Lumbar scoliosis.    05/09/2017 Imaging   Ct scan of abdomen and pelvis 1. Paraspinal mass adjacent to the left side of L3-L4 is less distinct than prior examinations, and is therefore difficult to discretely measure, however, the overall appearance suggests continued positive response to therapy. 2. No new signs of metastatic disease elsewhere in the abdomen or pelvis. 3. Aortic atherosclerosis, in addition to at least right coronary artery disease. Assessment for potential risk factor modification, dietary therapy or pharmacologic therapy may be warranted, if clinically indicated. 4. Colonic diverticulosis without evidence of acute diverticulitis at this time. 5. Additional incidental findings, as above. Aortic Atherosclerosis (ICD10-I70.0).    08/14/2017 Imaging   1. Treated left paraspinal mass centered at L3-4. Mass size is stable but there has been some evolution of tumor characteristics with less central fluid seen today. Recommend continued surveillance. 2. No evidence of untreated metastasis. 3. Degenerative changes and related impingement are described above.    10/28/2017 Imaging   Stable small left paraspinal soft tissue mass. No new or progressive disease within the abdomen or pelvis.   Cholelithiasis.  No radiographic evidence of cholecystitis.   Colonic diverticulosis, without  radiographic evidence of diverticulitis.    04/30/2018 Imaging   Stable post treatment change of the partially necrotic LEFT paravertebral mass at L3-L4. 18 x 21 mm cross-section. Mass effect on the LEFT L3 nerve root redemonstrated. Enhancing and atrophic LEFT psoas is inseparable.   10/15/2018 Imaging   1. Stable post treatment size and appearance of partially necrotic left paravertebral mass at L3-4, measuring 19 x 25 mm on current exam (unchanged when measured at similar level on previous study). Mass effect on the adjacent left L3 nerve root is unchanged. Adjacent enhancing and atrophic left psoas muscle. 2. Left convex scoliosis with associated multilevel degenerative spondylolysis and facet arthrosis, unchanged   10/29/2018 Imaging   Stable small left L3 paraspinal soft tissue mass. No evidence of new or progressive metastatic disease, or other acute findings.   Cholelithiasis.  No radiographic evidence of  cholecystitis.   Colonic diverticulosis, without radiographic evidence of diverticulitis.   11/11/2018 PET scan   1. Low level metabolism (max SUV 2.0) associated with the irregular subsolid superior segment right lower lobe 2.1 cm pulmonary nodule. As better depicted on the recent diagnostic chest CT study, this nodule crosses the major fissure into the right upper lobe and has increased in size and density on multiple imaging studies back to 2009. Findings are most suggestive of primary bronchogenic adenocarcinoma. 2. No hypermetabolic thoracic adenopathy. 3. Persistent hypermetabolism (max SUV 8.0) associated with the known left L3-4 paraspinous metastasis, mildly decreased in metabolism since 02/12/2016 PET-CT. 4. No additional sites of hypermetabolic metastatic disease in the abdomen or pelvis. No metabolic evidence of peritoneal recurrence. No ascites. 5. Chronic findings include: Aortic Atherosclerosis (ICD10-I70.0). Coronary atherosclerosis. Chronic bilateral maxillary sinusitis.  Cholelithiasis. Moderate sigmoid diverticulosis.   01/07/2019 - 04/04/2019 Chemotherapy   The patient had carboplatin and Alimta for chemotherapy treatment.     05/03/2019 -  Chemotherapy   The patient had Pembrolizumab and Lenvima for chemotherapy treatment.     07/23/2019 PET scan   1. 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 No new sites of hypermetabolic metastatic disease on today's study. 2. Stable appearance of surgical changes in the right lung without features to suggest local recurrence or metastatic lung cancer. 3. Cholelithiasis. 4.  Aortic Atherosclerois (ICD10-170.0)   10/20/2019 Imaging   Bilateral venous Doppler US RIGHT:  - Findings consistent with acute and occlusive deep vein thrombosis involving the right common femoral vein, right external iliac vein, right femoral vein, right popliteal vein, right posterior tibial veins, and right peroneal veins.   Unable to adequately visualize the common iliac veins due to bowel gas. The imaged portions of the IVC were patent.     LEFT:  - No evidence of common femoral vein obstruction.    11/08/2019 PET scan   1. Further reduction in activity at the left L3 level and left paraspinal tissues at L3. Maximum vertebral SUV currently 3.9, previously 4.8 on 07/23/2019 and previously 10.0 on 04/14/2019. 2. Late phase healing of previous left transverse process fractures at L3 and L4. Suspected healing right sixth rib fracture anteriorly. 3. There is new focal activity in the vicinity of the right internal jugular vein in the lower neck. Maximum SUV in this vicinity is 5.1. I not see a well-defined lymph node are thyroid lesion to corroborate with the focus, but surveillance in this region is suggested. 4. Other imaging findings of potential clinical significance: Aortic Atherosclerosis (ICD10-I70.0). Coronary atherosclerosis. Cholelithiasis. Sigmoid colon diverticulosis.   08/16/2020  PET scan   Signs of nodal recurrence in the LEFT pelvis as described.   Increased metabolic activity at L3 without discrete visible lesion, subtle sclerosis in this area seen in the context of degenerative change. But seen also at the site of previous disease suspicious for disease recurrence and with metabolic activity considerably increased compared to previous imaging.   Signs of partial lung resection in the RIGHT chest as before.   Muscular activity in the bilateral abductor compartment of the upper thigh RIGHT greater than LEFT felt to be physiologic. Correlate with any symptoms in this location.   Aortic atherosclerosis.   08/28/2020 -  Chemotherapy    Patient is on Treatment Plan: UTERINE LENVATINIB + PEMBROLIZUMAB Q21D       11/16/2020 PET scan   Resolution of activity associated with LEFT pelvic lymph nodes compared to previous  imaging but with worsening of FDG uptake at the site of previous disease in the L3 vertebral body. No discernible lesion aside from subtle sclerosis at this location. No visible paraspinal soft tissue.   New activity in the subjacent L4 vertebral body, new site of disease versus worsening of adjacent degenerative change.   MRI could be considered for further evaluation of above findings as warranted for direct comparison with previous MR imaging.   Focal increased metabolic uptake about the RIGHT shoulder without visible lesion on CT is of uncertain significance, attention on follow-up.   New area of nodularity about the RIGHT mid chest along the anterior pleural surface without associated increased metabolic activity. Suggest consideration for short interval follow-up.   Cholelithiasis and aortic atherosclerosis.   Aortic Atherosclerosis (ICD10-I70.0).   Metastatic cancer to bone ([place])  06/27/2014 Initial Diagnosis   Metastatic cancer to bone    Primary lung cancer, right ([place])  11/04/2018 Initial Diagnosis   Primary lung cancer, right ([place])    11/11/2018 PET scan   1. Low level metabolism (max SUV 2.0) associated with the irregular subsolid superior segment right lower lobe 2.1 cm pulmonary nodule. As better depicted on the recent diagnostic chest CT study, this nodule crosses the major fissure into the right upper lobe and has increased in size and density on multiple imaging studies back to 2009. Findings are most suggestive of primary bronchogenic adenocarcinoma. 2. No hypermetabolic thoracic adenopathy. 3. Persistent hypermetabolism (max SUV 8.0) associated with the known left L3-4 paraspinous metastasis, mildly decreased in metabolism since 02/12/2016 PET-CT. 4. No additional sites of hypermetabolic metastatic disease in the abdomen or pelvis. No metabolic evidence of peritoneal recurrence. No ascites. 5. Chronic findings include: Aortic Atherosclerosis (ICD10-I70.0). Coronary atherosclerosis. Chronic bilateral maxillary sinusitis. Cholelithiasis. Moderate sigmoid diverticulosis.   12/07/2018 Pathology Results   1. Lung, resection (segmental or lobe), Right Lobe Superior with endblock wedge of right upper lobe - INVASIVE ADENOCARCINOMA, MODERATELY DIFFERENTIATED, SPANNING 1.6 CM. - THE SURGICAL RESECTION MARGINS ARE NEGATIVE FOR CARCINOMA. - SEE ONCOLOGY TABLE BELOW. 2. Lymph node, biopsy, Level 9 #1 - THERE IS NO EVIDENCE OF CARCINOMA IN 1 OF 1 LYMPH NODE (0/1). 3. Lymph node, biopsy, Level 9 #2 - THERE IS NO EVIDENCE OF CARCINOMA IN 1 OF 1 LYMPH NODE (0/1). 4. Lymph node, biopsy, Level 7 #1 - METASTATIC CARCINOMA IN 1 OF 1 LYMPH NODE (1/1). 5. Lymph node, biopsy, Level 7 #2 - THERE IS NO EVIDENCE OF CARCINOMA IN 1 OF 1 LYMPH NODE (0/1). 6. Lymph node, biopsy, Level 12 #1 - THERE IS NO EVIDENCE OF CARCINOMA IN 1 OF 1 LYMPH NODE (0/1). 7. Lymph node, biopsy, Level 12 #2 - THERE IS NO EVIDENCE OF CARCINOMA IN 1 OF 1 LYMPH NODE (0/1). 8. Lymph node, biopsy, Level 10 #1 - THERE IS NO EVIDENCE OF CARCINOMA IN 1 OF 1 LYMPH NODE  (0/1). 9. Lymph node, biopsy, Level 4R #1 - THERE IS NO EVIDENCE OF CARCINOMA IN 1 OF 1 LYMPH NODE (0/1). 10. Lymph node, biopsy, Level 4R #2 - THERE IS NO EVIDENCE OF CARCINOMA IN 1 OF 1 LYMPH NODE (0/1). 11. Lymph node, biopsy, Level 2R #1 - THERE IS NO EVIDENCE OF CARCINOMA IN 1 OF 1 LYMPH NODE (0/1). 12. Lung, resection (segmental or lobe), Right Lobe Superior - BENIGN LUNG PARENCHYMA. - THERE IS NO EVIDENCE OF MALIGNANCY. Procedure: Segmentectomy. Specimen Laterality: Right. Tumor Site: Right superior lobe. Tumor Size: 1.6 cm (gross measurement). Tumor Focality: Unifocal. Histologic Type: Adenocarcinoma,  moderately differentiated. Visceral Pleura Invasion: Not identified. Lymphovascular Invasion: Not identified. Direct Invasion of Adjacent Structures: Not identified. Margins: Negative for carcinoma. Treatment Effect: N/A Regional Lymph Nodes: Number of Lymph Nodes Involved: 1 (level 7) Number of Lymph Nodes Examined: 10 Pathologic Stage Classification (pTNM, AJCC 8th Edition): pT1b, pN2 Ancillary Studies: The tumor cells are positive for Napsin-A, TTF-1, and p53. They are essentially negative for estrogen receptor, PAX-8 and progesterone receptor. Additional studies can be performed upon clinician request. Representative Tumor Block: 1D-1E. (JBK:gt, 12/09/18)   12/07/2018 Surgery   PREOPERATIVE DIAGNOSIS:  Right lung nodule.   POSTOPERATIVE DIAGNOSIS:  Non-small cell carcinoma- suspected primary lung carcinoma, clinical stage T2N01b.   PROCEDURE:   Right video-assisted thoracoscopy, Right lower lobe superior segmentectomy with en bloc wedge resection of right upper lobe, Lymph node dissection, Intercostal nerve block levels 3-9.   SURGEON:  Modesto Charon, MD   FINDINGS:  Mass palpable in posterior aspect of superior segment of right lower lobe, likely involvement across fissure into the upper lobe.  Frozen section revealed non-small cell carcinoma. upper and lower  lobe margins clear.   12/23/2018 Cancer Staging   Staging form: Lung, AJCC 8th Edition - Pathologic: Stage IIIA (pT1b, pN2, cM0) - Signed by Heath Lark, MD on 12/23/2018    01/05/2019 Imaging   MRI brain 1. No metastatic disease or acute intracranial abnormality identified. 2. Small chronic parafalcine meningioma size is stable since that described in 2006 (12 x 15 mm). 3. Advanced signal changes in the cerebral white matter and to a lesser extent pons, nonspecific but most commonly due to chronic small vessel disease. 4. Partially visible degenerative cervical spinal stenosis.     01/07/2019 -  Chemotherapy   The patient had carboplatin and Alimta for chemotherapy treatment.     04/14/2019 PET scan   1. 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 As shown on recent MRI of 04/01/2019, there is a new component of invasion into the left lower L3 vertebral body. This new component has a maximum SUV of 10.0, compatible with active malignancy. 2. Interval wedge resection of portions of the right upper lobe and right lower lobe at the site of the prior nodule. There is only minimal low-grade activity along the wedge resection clips, maximum SUV of 1.5. Surveillance is likely warranted. No new nodule identified. 3. Other imaging findings of potential clinical significance: Aortic Atherosclerosis (ICD10-I70.0). Coronary atherosclerosis. Thoracolumbar scoliosis. Cholelithiasis. Sigmoid colon diverticulosis.     05/03/2019 -  Chemotherapy   The patient had Pembrolizumab and Lenvima for chemotherapy treatment.     05/22/2019 Imaging   Ct head Atrophy, chronic microvascular disease.   No acute intracranial abnormality.     03/09/2020 PET scan   1. No evidence of malignant disease progression. 2. Similar mild metabolic activity within at the sclerotic L3 vertebral body lesion. Reduction in paraspinal muscular metabolic activity at L3 vertebral body level.     REVIEW  OF SYSTEMS:   Constitutional: Denies fevers, chills or abnormal weight loss Eyes: Denies blurriness of vision Ears, nose, mouth, throat, and face: Denies mucositis or sore throat Respiratory: Denies cough, dyspnea or wheezes Cardiovascular: Denies palpitation, chest discomfort or lower extremity swelling Gastrointestinal:  Denies nausea, heartburn or change in bowel habits Skin: Denies abnormal skin rashes Lymphatics: Denies new lymphadenopathy or easy bruising Neurological:Denies numbness, tingling or new weaknesses Behavioral/Psych: Mood is stable, no new changes  All other systems were reviewed with the patient and are negative.  I have  reviewed the past medical history, past surgical history, social history and family history with the patient and they are unchanged from previous note.  ALLERGIES:  is allergic to ace inhibitors, dilaudid [hydromorphone hcl], and codeine.  MEDICATIONS:  Current Outpatient Medications  Medication Sig Dispense Refill   atenolol (TENORMIN) 25 MG tablet Take 25 mg by mouth 2 (two) times daily.     BIOTIN PO Take 500 mcg by mouth daily.     calcium carbonate (TUMS - DOSED IN MG ELEMENTAL CALCIUM) 500 MG chewable tablet Chew 1 tablet by mouth daily. 1200 mg daily     cetirizine (ZYRTEC) 10 MG tablet Take 10 mg by mouth daily.     cholecalciferol (VITAMIN D3) 25 MCG (1000 UNIT) tablet Take 10,000 Units by mouth daily.     donepezil (ARICEPT) 5 MG tablet Take 1 tablet (5 mg total) by mouth at bedtime. 30 tablet 5   furosemide (LASIX) 20 MG tablet Take 1 tablet (20 mg total) by mouth daily. (Patient taking differently: Take 20 mg by mouth daily. Take 2 tabs on a Tuesday/Thursday and Saturday) 10 tablet 0   hydrALAZINE (APRESOLINE) 25 MG tablet Take 25 mg by mouth daily. One tablet Mid afternoon     hydrALAZINE (APRESOLINE) 50 MG tablet Take 50 mg by mouth 2 (two) times daily.     lenvatinib 10 mg daily dose (LENVIMA) capsule Take 1 capsule (10 mg total) by  mouth daily. 30 capsule 11   levothyroxine (SYNTHROID) 50 MCG tablet Take 1 tablet (50 mcg total) by mouth daily before breakfast. 30 tablet 3   memantine (NAMENDA) 5 MG tablet TAKE 2 TABLETS BY MOUTH TWICE A DAY 360 tablet 1   Multiple Vitamin (MULTIVITAMIN WITH MINERALS) TABS tablet Take 1 tablet by mouth daily.     oxyCODONE (OXY IR/ROXICODONE) 5 MG immediate release tablet Take 1 tablet (5 mg total) by mouth every 6 (six) hours as needed for severe pain. 60 tablet 0   Potassium 99 MG TABS Take 1 tablet by mouth daily.     rivaroxaban (XARELTO) 10 MG TABS tablet Take 1 tablet (10 mg total) by mouth daily. 30 tablet 11   simvastatin (ZOCOR) 20 MG tablet Take 20 mg by mouth every evening.      No current facility-administered medications for this visit.   Facility-Administered Medications Ordered in Other Visits  Medication Dose Route Frequency Provider Last Rate Last Admin   heparin lock flush 100 unit/mL  500 Units Intracatheter Once Alvy Bimler, Sir Mallis, MD       sodium chloride flush (NS) 0.9 % injection 10 mL  10 mL Intracatheter Once Alvy Bimler, Prosper Paff, MD        PHYSICAL EXAMINATION: ECOG PERFORMANCE STATUS: 1 - Symptomatic but completely ambulatory  Vitals:   11/17/20 1324  BP: (!) 154/63  Pulse: 74  Resp: 19  Temp: 98.9 F (37.2 C)  SpO2: 98%   Filed Weights   11/17/20 1324  Weight: 122 lb 9.6 oz (55.6 kg)    GENERAL:alert, no distress and comfortable NEURO: alert & oriented x 3 with fluent speech, no focal motor/sensory deficits  LABORATORY DATA:  I have reviewed the data as listed    Component Value Date/Time   NA 140 10/31/2020 0852   NA 140 05/09/2017 0844   K 3.3 (L) 10/31/2020 0852   K 4.1 05/09/2017 0844   CL 103 10/31/2020 0852   CO2 29 10/31/2020 0852   CO2 26 05/09/2017 0844   GLUCOSE 118 (H) 10/31/2020 1610  GLUCOSE 82 05/09/2017 0844   BUN 25 (H) 10/31/2020 0852   BUN 20.7 05/09/2017 0844   CREATININE 1.19 (H) 10/31/2020 0852   CREATININE 0.7 05/09/2017  0844   CALCIUM 9.7 10/31/2020 0852   CALCIUM 9.3 05/09/2017 0844   PROT 7.1 10/31/2020 0852   PROT 6.8 05/09/2017 0844   ALBUMIN 3.9 10/31/2020 0852   ALBUMIN 3.6 05/09/2017 0844   AST 41 10/31/2020 0852   AST 27 05/09/2017 0844   ALT 29 10/31/2020 0852   ALT 17 05/09/2017 0844   ALKPHOS 52 10/31/2020 0852   ALKPHOS 43 05/09/2017 0844   BILITOT 1.2 10/31/2020 0852   BILITOT 0.81 05/09/2017 0844   GFRNONAA 46 (L) 10/31/2020 0852   GFRAA >60 02/07/2020 1109    No results found for: SPEP, UPEP  Lab Results  Component Value Date   WBC 6.7 10/31/2020   NEUTROABS 3.9 10/31/2020   HGB 13.7 10/31/2020   HCT 41.1 10/31/2020   MCV 97.2 10/31/2020   PLT 155 10/31/2020      Chemistry      Component Value Date/Time   NA 140 10/31/2020 0852   NA 140 05/09/2017 0844   K 3.3 (L) 10/31/2020 0852   K 4.1 05/09/2017 0844   CL 103 10/31/2020 0852   CO2 29 10/31/2020 0852   CO2 26 05/09/2017 0844   BUN 25 (H) 10/31/2020 0852   BUN 20.7 05/09/2017 0844   CREATININE 1.19 (H) 10/31/2020 0852   CREATININE 0.7 05/09/2017 0844      Component Value Date/Time   CALCIUM 9.7 10/31/2020 0852   CALCIUM 9.3 05/09/2017 0844   ALKPHOS 52 10/31/2020 0852   ALKPHOS 43 05/09/2017 0844   AST 41 10/31/2020 0852   AST 27 05/09/2017 0844   ALT 29 10/31/2020 0852   ALT 17 05/09/2017 0844   BILITOT 1.2 10/31/2020 0852   BILITOT 0.81 05/09/2017 0844       RADIOGRAPHIC STUDIES: I have reviewed multiple imaging studies with the patient and her husband I have personally reviewed the radiological images as listed and agreed with the findings in the report. NM PET Image Restage (PS) Skull Base to Thigh  Result Date: 11/16/2020 CLINICAL DATA:  Subsequent treatment strategy for non-small cell lung cancer, metastatic in an 80 year old female. EXAM: NUCLEAR MEDICINE PET SKULL BASE TO THIGH TECHNIQUE: 6.73 mCi F-18 FDG was injected intravenously. Full-ring PET imaging was performed from the skull base to  thigh after the radiotracer. CT data was obtained and used for attenuation correction and anatomic localization. Fasting blood glucose: 92 mg/dl COMPARISON:  Multiple prior studies, most recent of April of 2022. FINDINGS: Mediastinal blood pool activity: SUV max 1.82 Liver activity: SUV max NA NECK: No hypermetabolic lymph nodes in the neck. Incidental CT findings: none CHEST: No hypermetabolic mediastinal or hilar nodes. No suspicious pulmonary nodules on the CT scan. Incidental CT findings: Calcified atheromatous plaque in the thoracic aorta. Normal heart size without pericardial effusion. Signs of three-vessel coronary artery disease. Normal caliber of central pulmonary vasculature. LEFT-sided Port-A-Cath terminates at the midportion of the superior vena cava as before. Signs of RIGHT axillary dissection similar to prior imaging. Evidence of partial lung resection in the RIGHT chest without change. No sign of consolidation or evidence of pleural effusion. New area of nodularity measuring 4 mm along the pleural surface in the RIGHT mid chest (image 38/8) this is not associated with increased metabolic activity. ABDOMEN/PELVIS: The LEFT common iliac lymph nodes seen on the previous study has resolved  nearly completely faintly visualize soft tissue on image 121 of series 4 measuring approximately 3 mm is all that remains. No signs of FDG uptake above blood pool in this location Additional small lymph node also with resolution of FDG uptake (image 128/4) maximum SUV of approximately 1.3 as compared to 5.6 on the prior study measuring 3 mm as compared to 7 mm short axis. No signs of solid organ disease or additional evidence of nodal disease. Incidental CT findings: Cholelithiasis. No acute findings relative to liver, gallbladder, spleen, pancreas, adrenal glands or kidneys. Urinary bladder is decompressed without perivesical stranding. Signs of colonic diverticulosis. No acute gastrointestinal process. Aortic  atherosclerosis with calcification. No aneurysmal dilation. Smooth contour of the IVC. No pelvic lymphadenopathy. Post hysterectomy. SKELETON: Increased metabolic activity about the L3 vertebral body, area increased since previous imaging with a maximum SUV of 14.1 as compared to 7.9 with continued worsening over the last 2 exams. This area is larger on FDG portion of the exam measuring up to 2.3 cm with respect to the area of the activity previously less than a cm. Subtle sclerosis is noted in this area without frank destruction. There is associated degenerative change. Small area in the L4 with increased metabolic activity is now apparent along the superior endplate. No additional hypermetabolic bony abnormalities. Mild increased metabolic activity about the RIGHT shoulder without corresponding visible lesion is favored to represent degenerative related changes. Incidental CT findings: Marked spinal degenerative changes and levoconvex rotary curvature in the lumbar spine. IMPRESSION: Resolution of activity associated with LEFT pelvic lymph nodes compared to previous imaging but with worsening of FDG uptake at the site of previous disease in the L3 vertebral body. No discernible lesion aside from subtle sclerosis at this location. No visible paraspinal soft tissue. New activity in the subjacent L4 vertebral body, new site of disease versus worsening of adjacent degenerative change. MRI could be considered for further evaluation of above findings as warranted for direct comparison with previous MR imaging. Focal increased metabolic uptake about the RIGHT shoulder without visible lesion on CT is of uncertain significance, attention on follow-up. New area of nodularity about the RIGHT mid chest along the anterior pleural surface without associated increased metabolic activity. Suggest consideration for short interval follow-up. Cholelithiasis and aortic atherosclerosis. Aortic Atherosclerosis (ICD10-I70.0).  Electronically Signed   By: Zetta Bills M.D.   On: 11/16/2020 12:01

## 2020-11-17 NOTE — Assessment & Plan Note (Signed)
As above, I plan to order MRI for further evaluation In the meantime, she will continue pain medicine as needed

## 2020-11-17 NOTE — Assessment & Plan Note (Signed)
I have reviewed her imaging studies with the patient and her husband Overall, she had positive response to treatment Radiologist commented increase activity in the lumbar spine Some time, bone healing can also show increased activity on the PET CT scan but not necessarily means disease progression I recommend we continue treatment as scheduled next week Due to her recent uncontrolled hypertension, I recommend changing her Lenvima to every other day  I will order MRI of her lumbar spine for further evaluation

## 2020-11-17 NOTE — Assessment & Plan Note (Signed)
Her blood pressure is better controlled We will resume Lenvima at every other day dose until her next visit

## 2020-11-17 NOTE — Assessment & Plan Note (Signed)
Her pain is well controlled I refilled her prescription oxycodone I reminded her side effects including sedation and constipation

## 2020-11-20 ENCOUNTER — Telehealth: Payer: Self-pay | Admitting: Hematology and Oncology

## 2020-11-20 NOTE — Telephone Encounter (Signed)
Scheduled per 7/8 sch msg. Called pt and left a msg

## 2020-11-21 ENCOUNTER — Inpatient Hospital Stay: Payer: Medicare Other

## 2020-11-21 ENCOUNTER — Other Ambulatory Visit: Payer: Self-pay

## 2020-11-21 VITALS — BP 181/78 | HR 60 | Temp 98.3°F | Resp 18

## 2020-11-21 DIAGNOSIS — Z7189 Other specified counseling: Secondary | ICD-10-CM

## 2020-11-21 DIAGNOSIS — C7951 Secondary malignant neoplasm of bone: Secondary | ICD-10-CM

## 2020-11-21 DIAGNOSIS — Z5112 Encounter for antineoplastic immunotherapy: Secondary | ICD-10-CM | POA: Diagnosis not present

## 2020-11-21 DIAGNOSIS — G893 Neoplasm related pain (acute) (chronic): Secondary | ICD-10-CM | POA: Diagnosis not present

## 2020-11-21 DIAGNOSIS — D701 Agranulocytosis secondary to cancer chemotherapy: Secondary | ICD-10-CM

## 2020-11-21 DIAGNOSIS — Z853 Personal history of malignant neoplasm of breast: Secondary | ICD-10-CM

## 2020-11-21 DIAGNOSIS — C541 Malignant neoplasm of endometrium: Secondary | ICD-10-CM | POA: Diagnosis not present

## 2020-11-21 DIAGNOSIS — Z95828 Presence of other vascular implants and grafts: Secondary | ICD-10-CM

## 2020-11-21 DIAGNOSIS — Z171 Estrogen receptor negative status [ER-]: Secondary | ICD-10-CM | POA: Diagnosis not present

## 2020-11-21 DIAGNOSIS — C549 Malignant neoplasm of corpus uteri, unspecified: Secondary | ICD-10-CM

## 2020-11-21 DIAGNOSIS — C3431 Malignant neoplasm of lower lobe, right bronchus or lung: Secondary | ICD-10-CM | POA: Diagnosis not present

## 2020-11-21 LAB — CMP (CANCER CENTER ONLY)
ALT: 17 U/L (ref 0–44)
AST: 26 U/L (ref 15–41)
Albumin: 3.5 g/dL (ref 3.5–5.0)
Alkaline Phosphatase: 53 U/L (ref 38–126)
Anion gap: 11 (ref 5–15)
BUN: 26 mg/dL — ABNORMAL HIGH (ref 8–23)
CO2: 31 mmol/L (ref 22–32)
Calcium: 9.7 mg/dL (ref 8.9–10.3)
Chloride: 101 mmol/L (ref 98–111)
Creatinine: 1.12 mg/dL — ABNORMAL HIGH (ref 0.44–1.00)
GFR, Estimated: 50 mL/min — ABNORMAL LOW (ref >60)
Glucose, Bld: 88 mg/dL (ref 70–99)
Potassium: 3.7 mmol/L (ref 3.5–5.1)
Sodium: 143 mmol/L (ref 135–145)
Total Bilirubin: 0.6 mg/dL (ref 0.3–1.2)
Total Protein: 7.4 g/dL (ref 6.5–8.1)

## 2020-11-21 LAB — CBC WITH DIFFERENTIAL (CANCER CENTER ONLY)
Abs Immature Granulocytes: 0.08 10*3/uL — ABNORMAL HIGH (ref 0.00–0.07)
Basophils Absolute: 0.1 10*3/uL (ref 0.0–0.1)
Basophils Relative: 1 %
Eosinophils Absolute: 0.1 10*3/uL (ref 0.0–0.5)
Eosinophils Relative: 2 %
HCT: 38.4 % (ref 36.0–46.0)
Hemoglobin: 12.7 g/dL (ref 12.0–15.0)
Immature Granulocytes: 1 %
Lymphocytes Relative: 18 %
Lymphs Abs: 1.4 10*3/uL (ref 0.7–4.0)
MCH: 33 pg (ref 26.0–34.0)
MCHC: 33.1 g/dL (ref 30.0–36.0)
MCV: 99.7 fL (ref 80.0–100.0)
Monocytes Absolute: 1.2 10*3/uL — ABNORMAL HIGH (ref 0.1–1.0)
Monocytes Relative: 15 %
Neutro Abs: 5 10*3/uL (ref 1.7–7.7)
Neutrophils Relative %: 63 %
Platelet Count: 216 10*3/uL (ref 150–400)
RBC: 3.85 MIL/uL — ABNORMAL LOW (ref 3.87–5.11)
RDW: 13.3 % (ref 11.5–15.5)
WBC Count: 8 10*3/uL (ref 4.0–10.5)
nRBC: 0 % (ref 0.0–0.2)

## 2020-11-21 LAB — TOTAL PROTEIN, URINE DIPSTICK: Protein, ur: NEGATIVE mg/dL

## 2020-11-21 LAB — TSH: TSH: 1.34 u[IU]/mL (ref 0.308–3.960)

## 2020-11-21 MED ORDER — SODIUM CHLORIDE 0.9 % IV SOLN
Freq: Once | INTRAVENOUS | Status: AC
  Filled 2020-11-21: qty 250

## 2020-11-21 MED ORDER — HEPARIN SOD (PORK) LOCK FLUSH 100 UNIT/ML IV SOLN
500.0000 [IU] | Freq: Once | INTRAVENOUS | Status: AC | PRN
Start: 2020-11-21 — End: 2020-11-21
  Administered 2020-11-21: 500 [IU]
  Filled 2020-11-21: qty 5

## 2020-11-21 MED ORDER — SODIUM CHLORIDE 0.9% FLUSH
10.0000 mL | Freq: Once | INTRAVENOUS | Status: AC
  Administered 2020-11-21: 10 mL
  Filled 2020-11-21: qty 10

## 2020-11-21 MED ORDER — SODIUM CHLORIDE 0.9% FLUSH
10.0000 mL | INTRAVENOUS | Status: DC | PRN
  Administered 2020-11-21: 10 mL
  Filled 2020-11-21: qty 10

## 2020-11-21 MED ORDER — SODIUM CHLORIDE 0.9 % IV SOLN
200.0000 mg | Freq: Once | INTRAVENOUS | Status: AC
  Administered 2020-11-21: 200 mg via INTRAVENOUS
  Filled 2020-11-21: qty 8

## 2020-11-21 NOTE — Patient Instructions (Signed)
[facility]  Discharge Instructions: Thank you for choosing [name] to provide your oncology and hematology care.   If you have a lab appointment with the [facility], please go directly to the [facility] and check in at the registration area.   Wear comfortable clothing and clothing appropriate for easy access to any Portacath or PICC line.   We strive to give you quality time with your provider. You may need to reschedule your appointment if you arrive late (15 or more minutes).  Arriving late affects you and other patients whose appointments are after yours.  Also, if you miss three or more appointments without notifying the office, you may be dismissed from the clinic at the provider's discretion.      For prescription refill requests, have your pharmacy contact our office and allow 72 hours for refills to be completed.    Today you received the following chemotherapy and/or immunotherapy agents [name]      To help prevent nausea and vomiting after your treatment, we encourage you to take your nausea medication as directed.  BELOW ARE SYMPTOMS THAT SHOULD BE REPORTED IMMEDIATELY: *FEVER GREATER THAN 100.4 F (38 C) OR HIGHER *CHILLS OR SWEATING *NAUSEA AND VOMITING THAT IS NOT CONTROLLED WITH YOUR NAUSEA MEDICATION *UNUSUAL SHORTNESS OF BREATH *UNUSUAL BRUISING OR BLEEDING *URINARY PROBLEMS (pain or burning when urinating, or frequent urination) *BOWEL PROBLEMS (unusual diarrhea, constipation, pain near the anus) TENDERNESS IN MOUTH AND THROAT WITH OR WITHOUT PRESENCE OF ULCERS (sore throat, sores in mouth, or a toothache) UNUSUAL RASH, SWELLING OR PAIN  UNUSUAL VAGINAL DISCHARGE OR ITCHING   Items with * indicate a potential emergency and should be followed up as soon as possible or go to the Emergency Department if any problems should occur.  Please show the CHEMOTHERAPY ALERT CARD or IMMUNOTHERAPY ALERT CARD at check-in to  the Emergency Department and triage nurse.  Should you have questions after your visit or need to cancel or reschedule your appointment, please contact [facility]  Dept: [phone]  and follow the prompts.  Office hours are 8:00 a.m. to 4:30 p.m. Monday - Friday. Please note that voicemails left after 4:00 p.m. may not be returned until the following business day.  We are closed weekends and major holidays. You have access to a nurse at all times for urgent questions. Please call the main number to the clinic Dept: [phone] and follow the prompts.   For any non-urgent questions, you may also contact your provider using MyChart. We now offer e-Visits for anyone 73 and older to request care online for non-urgent symptoms. For details visit [email or web address].   Also download the MyChart app! Go to the app store, search "MyChart", open the app, select [facility], and log in with your MyChart username and password.  Due to Covid, a mask is required upon entering the hospital/clinic. If you do not have a mask, one will be given to you upon arrival. For doctor visits, patients may have 1 support person aged 30 or older with them. For treatment visits, patients cannot have anyone with them due to current Covid guidelines and our immunocompromised population.

## 2020-11-21 NOTE — Progress Notes (Signed)
Ok to treat with elevated BP per Heath Lark, MD

## 2020-11-21 NOTE — Patient Instructions (Signed)

## 2020-11-22 LAB — T4: T4, Total: 8.7 ug/dL (ref 4.5–12.0)

## 2020-11-25 ENCOUNTER — Ambulatory Visit (HOSPITAL_COMMUNITY)
Admission: RE | Admit: 2020-11-25 | Discharge: 2020-11-25 | Disposition: A | Discharge status: Home / Self Care | Payer: Medicare Other | Source: Ambulatory Visit | Attending: Hematology and Oncology | Admitting: Hematology and Oncology

## 2020-11-25 ENCOUNTER — Other Ambulatory Visit: Payer: Self-pay

## 2020-11-25 DIAGNOSIS — Z85830 Personal history of malignant neoplasm of bone: Secondary | ICD-10-CM | POA: Diagnosis not present

## 2020-11-25 DIAGNOSIS — C7951 Secondary malignant neoplasm of bone: Secondary | ICD-10-CM | POA: Insufficient documentation

## 2020-11-25 DIAGNOSIS — M48061 Spinal stenosis, lumbar region without neurogenic claudication: Secondary | ICD-10-CM | POA: Diagnosis not present

## 2020-11-25 DIAGNOSIS — C541 Malignant neoplasm of endometrium: Secondary | ICD-10-CM | POA: Diagnosis not present

## 2020-11-25 MED ORDER — GADOBUTROL 1 MMOL/ML IV SOLN
5.0000 mL | Freq: Once | INTRAVENOUS | Status: AC | PRN
  Administered 2020-11-25: 5 mL via INTRAVENOUS

## 2020-11-28 ENCOUNTER — Other Ambulatory Visit: Payer: Self-pay

## 2020-11-28 ENCOUNTER — Inpatient Hospital Stay (HOSPITAL_BASED_OUTPATIENT_CLINIC_OR_DEPARTMENT_OTHER): Payer: Medicare Other | Admitting: Hematology and Oncology

## 2020-11-28 ENCOUNTER — Encounter: Payer: Self-pay | Admitting: Hematology and Oncology

## 2020-11-28 DIAGNOSIS — R413 Other amnesia: Secondary | ICD-10-CM

## 2020-11-28 DIAGNOSIS — Z171 Estrogen receptor negative status [ER-]: Secondary | ICD-10-CM | POA: Diagnosis not present

## 2020-11-28 DIAGNOSIS — Z5112 Encounter for antineoplastic immunotherapy: Secondary | ICD-10-CM | POA: Diagnosis not present

## 2020-11-28 DIAGNOSIS — G893 Neoplasm related pain (acute) (chronic): Secondary | ICD-10-CM

## 2020-11-28 DIAGNOSIS — C541 Malignant neoplasm of endometrium: Secondary | ICD-10-CM | POA: Diagnosis not present

## 2020-11-28 DIAGNOSIS — I10 Essential (primary) hypertension: Secondary | ICD-10-CM | POA: Diagnosis not present

## 2020-11-28 DIAGNOSIS — C7951 Secondary malignant neoplasm of bone: Secondary | ICD-10-CM | POA: Diagnosis not present

## 2020-11-28 DIAGNOSIS — C3431 Malignant neoplasm of lower lobe, right bronchus or lung: Secondary | ICD-10-CM | POA: Diagnosis not present

## 2020-11-28 NOTE — Progress Notes (Signed)
[place] OFFICE PROGRESS NOTE  Patient Care Team: Shon Baton, MD as PCP - General (Internal Medicine) Heath Lark, MD as Consulting Physician (Hematology and Oncology)  ASSESSMENT & PLAN:  Endometrial cancer [facility]) I have reviewed multiple imaging studies with the patient and her husband Her recent PET CT scan suggested positive response everywhere else except for her bone The MRI that was done recently is compared with MRI over a year ago which showed new changes She does have back pain but currently is well controlled with pain medicine Overall, I do not believe she progress on her current treatment The progression seen on her back likely happen when she stopped treatment several months ago For now, I recommend we continue prescribed pembrolizumab with Lenvima I plan to order PET CT scan and MRI together in the future for better assessment of response to therapy  Cancer associated pain She is taking oxycodone on a regular basis I refilled her prescription 2 weeks ago but according to the patient's husband, he was not able to get her medicine refills With assistant from a nursing staff, were able to get her pain medicine refilled today  Essential hypertension She brought a long documented blood pressure from home There is 1 time on July 14, her systolic blood pressure was 171 around 6:30 PM On July 15, her morning blood pressure was elevated at 171 Majority of all her other blood pressure measurement were within normal range I will defer to her primary care doctor for further management  Memory impairment She has significant cognitive decline She is on Namenda She has appointment to see neurologist  Her husband is making sure that the patient is taking Lenvima correctly as prescribed  No orders of the defined types were placed in this encounter.   All questions were answered. The patient knows to call the clinic with any problems, questions or concerns. The  total time spent in the appointment was 30 minutes encounter with patients including review of chart and various tests results, discussions about plan of care and coordination of care plan   Heath Lark, MD 11/28/2020 4:11 PM  INTERVAL HISTORY: Please see below for problem oriented charting. She returns with her husband for further follow-up According to the patient, her pain is better controlled She is only taking oxycodone twice daily Her husband have difficulties refilling her pain medicine recently She has no recent falls She brought along documented blood pressure for the last week Majority of her blood pressure were within normal range  SUMMARY OF ONCOLOGIC HISTORY: Oncology History Overview Note  Biopsy of recurrence 06-2014 ER negative. PDL1 testing low at 2% on the uterine cancer She progressed on carboplatin and Paclitaxel for uterine cancer HER 2 negative on pathology [id number] Negative genetic testing  On her lung cancer sample, [facility] One testing revealed 10% PD-1 positive EGFR mutation was detected, exon 19 deletion   Endometrial cancer ([place])  12/01/2007 Initial Diagnosis   She has recurrent papillary serous endometrial carcinoma. Briefly she was diagnosed in 2009 with a FIGO IB pappilary serous carcinoma treated with hysterectomy followed by adjuvant chemo and vaginal brachytherapy. She then had a recurrence diagnosed in late 2012 that was deemed not to be surgically resectable, and was treated with 6 cycles of carbo/taxol followed by 5040 cGy of EBRT to the pelvic mass. She was then followed with serial imaging and was noted in June of this year to have slight increase in the size of the mass, and again in September  there was small enlargement of the mass. She had a re-staging PET scan on 03/10/13 that showed FDG activity in the pelvic mass, with no other areas of disease    12/01/2007 Imaging   CT scan of chest, abdomen and pelvis: 1.  Mass like area of decreased  attenuation in the central uterus, consistent with the known endometrial carcinoma.  Deep myometrial invasion is suspected.  Pelvic MRI without and with contrast could be performed for further staging workup if clinically warranted. 2.  No evidence of extrauterine extension or lymphadenopathy. 3.  Sigmoid diverticulosis incidentally noted.    12/15/2007 Surgery   --12/2007 laparoscopic hysterectomy and PLND --05/2008 complted 6 cycles adjuvant carbo/taxol followed by vaginal cuff brachy --03/2011 exploratory lararoscopy, ureterolysis --4/13 completed 6 cycels carbotaxol --8/13 completed EBRT to the left pelvic sidewall    06/10/2008 Imaging   CT scan abdomen and pelvis 1.  Nonspecific mildly prominent inguinal lymph nodes, right greater than left. 2.  Interval hysterectomy without evidence of recurrent pelvic mass or fluid collection.    03/22/2011 Imaging   Ct scan abdomen and pelvis 1.  Local uterine cancer recurrence with two large nodes along the left pelvic sidewall. 2.  No evidence of bowel obstruction, urinary obstruction, or more distant metastasis.    03/31/2011 Relapse/Recurrence   chemotherapy and external beam radiation    05/16/2011 - 09/17/2011 Chemotherapy   She had 6 cycles of carboplatin and Taxol     07/17/2011 Imaging   Ct scan abdomen and pelvis 1.  Interval improvement in the previously demonstrated left pelvic local recurrence of tumor. 2.  No disease progression or complication identified. 3.  Mild bladder wall thickening on the right, nonspecific and possibly related to incomplete distension and/or radiation therapy. 4.  Cholelithiasis    10/11/2011 Imaging   CT scan abdomen and pelvis 1.  Interval enlargement of the left pelvic sidewall masses. 2.  No new nodular disease in the pelvis or lymphadenopathy. 3.  No evidence  of distant metastasis. 4.  Mild hydronephrosis on the right is similar to prior. 5.  Focal thickening of the right aspect the bladder is  stable compared to prior.      10/28/2011 - 12/05/2011 Radiation Therapy   radiation for pelvic sidewall recurrence    10/28/2011 - 12/05/2011 Radiation Therapy   10/28/11-12/05/11: Radiotherapy to the left pelvic sidewall    03/18/2012 Imaging   CT abdomen 1.  Today's study demonstrates a positive response to therapy with decreased size of left pelvic sidewall nodal masses, as detailed above.  No new soft tissue masses or new lymphadenopathy is identified within the abdomen or pelvis. Continue attention on follow-up studies is recommended. 2.  Cholelithiasis without findings to suggest acute cholecystitis.  3.  Colonic diverticulosis without findings to suggest acute diverticulitis at this time. 4.  Mild asymmetric urinary bladder wall thickening is unchanged compared to prior examinations and is nonspecific.  No definite bladder wall mass is identified at this time. 5.  Additional findings, similar to prior examinations, as above    06/22/2012 Imaging   CT abdomen 1.  Further decreased size of the left pelvic peripherally enhancing lesion. 2.  No new sites of new or progressive disease. 3. Esophageal air fluid level suggests dysmotility or gastroesophageal reflux. 4.  Cholelithiasis    11/02/2012 Imaging   CT abdomen 1.  The left pelvic side wall lesion demonstrates mild increase in size from previous exam. 2.  No new sites of new or progressive disease. 3.  Cholelithiasis.      01/28/2013 Imaging   CT abdomen 1. Interval small increase in volume of enhancing mass adjacent to the left pelvic sidewall. 2. No evidence of abdominal or pelvic lymphadenopathy    04/02/2013 Surgery   pelvic mass resection with IORT    04/02/2013 - 04/02/2013 Radiation Therapy   04/02/13: Intraoperative radiotherapy to the left pelvis    05/10/2013 PET scan   1. Hypermetabolic mass in the deep left pelvis consistent with metastasis. 2. No additional evidence of local metastasis. No evidence of  distant metastasis. 3. Small right lower lobe pulmonary nodule is not hypermetabolic.    07/27/2013 Imaging   No evidence of recurrent or metastatic disease. Apparent prior resection of the deep left pelvic metastasis.    02/08/2014 Imaging   No CT findings for recurrent or metastatic disease involving the abdomen/ pelvis    06/13/2014 Relapse/Recurrence   + recurrence paraspinous muscle    06/21/2014 Procedure   There is a soft tissue lesion along the left side of L3-L4. This lesion roughly measures up to 2.2 cm. Needle was positioned along the posterior aspect of the lesion. Small amount of air in the paraspinal tissues following needle removal    06/21/2014 Pathology Results   Bone, biopsy, left lumbar paraspinal - METASTATIC POORLY DIFFERENTIATED CARCINOMA, CONSISTENT WITH HIGH GRADE SEROUS CARCINOMA SEE COMMENT. Microscopic Comment The carcinoma demonstrates the following immunophenotype: Cytokeratin 7 - patchy moderate to strong expression Estrogen receptor - negative expression P53 - strong diffuse expression TTF-1 - negative expression WT-1 - negative expression CD56 - focal moderate strong expression Synaptophysin - negative expression GCDFP - negative expression The history of primary endometrial papillary serous carcinoma and primary mammary carcinoma is noted. In the current case, the overall morphologic and immunophenotype are that of poorly differentiated carcinoma, consistent with high grade serous carcinoma.     07/11/2014 - 08/12/2014 Radiation Therapy   07/11/14-08/12/14: 55 Gy in 25 fractions to the Lumbar spine    08/15/2014 Imaging   CT scan of chest, abdomen and pelvis 1. Since the biopsy study of 06/21/2014, similar to slight decrease in size of a left paraspinous lesion at L3-4. 2. No new sites of metastatic disease identified. 3. 8 mm ground-glass nodule in the right lower lobe is grossly similar to 03/10/2013, suggesting a benign etiology. 4.  Cholelithiasis. 5. Apparent sigmoid colonic wall thickening which could be due to underdistention. Colitis felt less likely. 6.  Atherosclerosis, including within the coronary arteries. 7. Pelvic floor laxity.    10/07/2014 Imaging   CT scan of chest, abdomen and pelvis: Stable to slight interval decrease in size of left paraspinous lesion at L3-4. No new sites of metastatic disease. Unchanged ground-glass nodule within the right lower lobe, potentially benign in etiology. Recommend attention on followup. Cholelithiasis. Sigmoid colonic diverticulosis. No CT evidence to suggest acute diverticulitis.    01/19/2015 Imaging   Ct scan of abdomen and pelvis 1. Decrease in size of left paraspinous metastasis. 2. No new sites of disease. 3. Stable ground-glass attenuating nodule within the superior segment of right lower lobe. 4. Gallstones    05/02/2015 Imaging   Ct abdomen and pelvis: Slight interval increase in size of left lumbar paraspinal soft tissue metastasis. No new sites of metastatic disease identified within the abdomen or pelvis.      05/11/2015 PET scan   1. The small soft tissue mass just lateral to the left L3 for neural foramen is hypermetabolic, favoring malignancy. 2. There is also a  small focus of hypermetabolic activity between the left L1 and L2 transverse processes, but without a CT correlate. 3. The 13 mm sub solid nodule in the superior segment right lower lobe is stable from recent exams but has slowly increased in size over the last 7 years. Although not hypermetabolic, the appearance is concerning for the possibility of a low grade adenocarcinoma. 4. Other imaging findings of potential clinical significance: Chronic bilateral maxillary sinusitis. Coronary, aortic arch, and branch vessel atherosclerotic vascular disease. Aortoiliac atherosclerotic vascular disease. Cholelithiasis. Sigmoid colon diverticulosis. Lumbar scoliosis.    07/11/2015 Imaging   Ct scan of chest,  abdomen and pelvis: 1. 13 mm mixed solid and sub solid nodule in the posterior right lower lobe was present in 2009 and has clearly progressed in the interval since that study. Imaging features remain highly concerning for low-grade or well differentiated adenocarcinoma. 2. Slight increase size of in the hypermetabolic, rim enhancing nodule identified adjacent to the left L3-4 neural foramen. Metastatic disease remains a concern. 3. No evidence for discrete soft tissue lesion between the left L1 and 2 transverse processes, the site of focal FDG uptake on the recent PET-CT. 4. Cholelithiasis. 5. Abdominal aortic atherosclerosis    08/03/2015 - 01/16/2016 Chemotherapy   She received carboplatin and Taxol   11/06/2015 PET scan   1. Hypermetabolic left Y6-9 paraspinous metastasis (biopsy-proven) is stable in size and slightly decreased in metabolism. 2. Previously described small focus of hypermetabolism between the left L2 and L3 spinous processes has resolved. 3. New mild linear hypermetabolism to the left of the T11-12 spinous processes without discrete mass on the CT images, favor benign activity related activity, recommend attention on follow-up PET-CT.  4. No definite new sites of hypermetabolic metastatic disease. No recurrent hypermetabolic metastatic disease in the pelvis. 5. Interval stability of subsolid 1.3 cm superior segment right lower lobe pulmonary nodule without associated significant metabolism, which has grown compared to the 2009 chest CT study, and remain suspicious for low grade adenocarcinoma. 6. Additional findings include aortic atherosclerosis, coronary atherosclerosis, mild multinodular goiter with no hypermetabolic thyroid nodules, cholelithiasis and moderate sigmoid diverticulosis.    02/12/2016 PET scan   1. Interval increase in size and metabolic activity of the LEFT paraspinal soft tissue metastasis. 2. New activity within the musculature of the LEFT chest wall. Given  the unusual location of the paraspinal metastasis cannot exclude a second soft tissue metastasis to the musculature however favor benign physiologic activity. 3. Stable RIGHT lower lobe pulmonary nodule. 4. No evidence of local recurrence.        04/08/2016 Imaging   Ct scan of chest, abdomen and pelvis: 1. Similar size of a  left paravertebral abdominal lesion. 2. No new or progressive metastatic disease. 3. No correlate for the muscular activity about the lateral left chest wall. 4.  Coronary artery atherosclerosis. Aortic atherosclerosis. 5. Cholelithiasis. 6. Similar right lower lobe pulmonary nodule.    06/03/2016 Imaging   CT abdomen and pelvis 1. Similar to mild enlargement of a paravertebral soft tissue lesion at the L3-4 level. 2. No new sites of disease identified. 3. Cholelithiasis. 4. Hysterectomy. 5.  Aortic atherosclerosis.      09/02/2016 Imaging   CT abdomen and pelvis 1. Continue mild interval increase in size of LEFT paraspinal mass (1-2 mm). 2. No evidence of new metastatic disease in the abdomen pelvis. 3. Post hysterectomy anatomy    09/26/2016 Imaging   MR lumbar spine 1. Left paraspinal metastasis with epicenter adjacent to the left  L3 neural foramen does extend into the foramen to the level of the left lateral epidural space (series 9, image 31), but also tracks cephalad and caudal along the L2-L4 lumbar plexus (series 10, image 15). No extension into the left L2 or L4 neural foramina. And no more distal extension of tumor. 2. Abnormal signal in the medial left psoas and ventral left erector spinae muscles at L2 and L3 is probably denervation related. 3. No bone invasion or osseous metastatic disease. Suspect previous radiation of the L2 through L5 spinal levels. 4. No dural or intradural metastatic disease.      10/22/2016 Procedure   She underwent stereotactic Radiosurgery    10/30/2016 Genetic Testing   Patient has genetic testing done for  Inheritable genetic mutation panel. Results revealed patient has no actionable mutation.    01/21/2017 Imaging   1. Reduced size and conspicuity of the left paraspinal mass at the L3-4 level. The mass still present but has enhancement similar to that of adjacent psoas musculature. 2. No new metastatic disease is identified. 3. Other imaging findings of potential clinical significance: Cholelithiasis. Aortic Atherosclerosis (ICD10-I70.0). Sigmoid diverticulosis. Lumbar scoliosis.    05/09/2017 Imaging   Ct scan of abdomen and pelvis 1. Paraspinal mass adjacent to the left side of L3-L4 is less distinct than prior examinations, and is therefore difficult to discretely measure, however, the overall appearance suggests continued positive response to therapy. 2. No new signs of metastatic disease elsewhere in the abdomen or pelvis. 3. Aortic atherosclerosis, in addition to at least right coronary artery disease. Assessment for potential risk factor modification, dietary therapy or pharmacologic therapy may be warranted, if clinically indicated. 4. Colonic diverticulosis without evidence of acute diverticulitis at this time. 5. Additional incidental findings, as above. Aortic Atherosclerosis (ICD10-I70.0).    08/14/2017 Imaging   1. Treated left paraspinal mass centered at L3-4. Mass size is stable but there has been some evolution of tumor characteristics with less central fluid seen today. Recommend continued surveillance. 2. No evidence of untreated metastasis. 3. Degenerative changes and related impingement are described above.    10/28/2017 Imaging   Stable small left paraspinal soft tissue mass. No new or progressive disease within the abdomen or pelvis.   Cholelithiasis.  No radiographic evidence of cholecystitis.   Colonic diverticulosis, without radiographic evidence of diverticulitis.    04/30/2018 Imaging   Stable post treatment change of the partially necrotic LEFT paravertebral  mass at L3-L4. 18 x 21 mm cross-section. Mass effect on the LEFT L3 nerve root redemonstrated. Enhancing and atrophic LEFT psoas is inseparable.   10/15/2018 Imaging   1. Stable post treatment size and appearance of partially necrotic left paravertebral mass at L3-4, measuring 19 x 25 mm on current exam (unchanged when measured at similar level on previous study). Mass effect on the adjacent left L3 nerve root is unchanged. Adjacent enhancing and atrophic left psoas muscle. 2. Left convex scoliosis with associated multilevel degenerative spondylolysis and facet arthrosis, unchanged   10/29/2018 Imaging   Stable small left L3 paraspinal soft tissue mass. No evidence of new or progressive metastatic disease, or other acute findings.   Cholelithiasis.  No radiographic evidence of cholecystitis.   Colonic diverticulosis, without radiographic evidence of diverticulitis.   11/11/2018 PET scan   1. Low level metabolism (max SUV 2.0) associated with the irregular subsolid superior segment right lower lobe 2.1 cm pulmonary nodule. As better depicted on the recent diagnostic chest CT study, this nodule crosses the major fissure into the  right upper lobe and has increased in size and density on multiple imaging studies back to 2009. Findings are most suggestive of primary bronchogenic adenocarcinoma. 2. No hypermetabolic thoracic adenopathy. 3. Persistent hypermetabolism (max SUV 8.0) associated with the known left L3-4 paraspinous metastasis, mildly decreased in metabolism since 02/12/2016 PET-CT. 4. No additional sites of hypermetabolic metastatic disease in the abdomen or pelvis. No metabolic evidence of peritoneal recurrence. No ascites. 5. Chronic findings include: Aortic Atherosclerosis (ICD10-I70.0). Coronary atherosclerosis. Chronic bilateral maxillary sinusitis. Cholelithiasis. Moderate sigmoid diverticulosis.   01/07/2019 - 04/04/2019 Chemotherapy   The patient had carboplatin and Alimta for  chemotherapy treatment.     05/03/2019 -  Chemotherapy   The patient had Pembrolizumab and Lenvima for chemotherapy treatment.     07/23/2019 PET scan   1. 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 No new sites of hypermetabolic metastatic disease on today's study. 2. Stable appearance of surgical changes in the right lung without features to suggest local recurrence or metastatic lung cancer. 3. Cholelithiasis. 4.  Aortic Atherosclerois (ICD10-170.0)   10/20/2019 Imaging   Bilateral venous Doppler US RIGHT:  - Findings consistent with acute and occlusive deep vein thrombosis involving the right common femoral vein, right external iliac vein, right femoral vein, right popliteal vein, right posterior tibial veins, and right peroneal veins.   Unable to adequately visualize the common iliac veins due to bowel gas. The imaged portions of the IVC were patent.     LEFT:  - No evidence of common femoral vein obstruction.    11/08/2019 PET scan   1. Further reduction in activity at the left L3 level and left paraspinal tissues at L3. Maximum vertebral SUV currently 3.9, previously 4.8 on 07/23/2019 and previously 10.0 on 04/14/2019. 2. Late phase healing of previous left transverse process fractures at L3 and L4. Suspected healing right sixth rib fracture anteriorly. 3. There is new focal activity in the vicinity of the right internal jugular vein in the lower neck. Maximum SUV in this vicinity is 5.1. I not see a well-defined lymph node are thyroid lesion to corroborate with the focus, but surveillance in this region is suggested. 4. Other imaging findings of potential clinical significance: Aortic Atherosclerosis (ICD10-I70.0). Coronary atherosclerosis. Cholelithiasis. Sigmoid colon diverticulosis.   08/16/2020 PET scan   Signs of nodal recurrence in the LEFT pelvis as described.   Increased metabolic activity at L3 without discrete visible  lesion, subtle sclerosis in this area seen in the context of degenerative change. But seen also at the site of previous disease suspicious for disease recurrence and with metabolic activity considerably increased compared to previous imaging.   Signs of partial lung resection in the RIGHT chest as before.   Muscular activity in the bilateral abductor compartment of the upper thigh RIGHT greater than LEFT felt to be physiologic. Correlate with any symptoms in this location.   Aortic atherosclerosis.   08/28/2020 -  Chemotherapy    Patient is on Treatment Plan: UTERINE LENVATINIB + PEMBROLIZUMAB Q21D       11/16/2020 PET scan   Resolution of activity associated with LEFT pelvic lymph nodes compared to previous imaging but with worsening of FDG uptake at the site of previous disease in the L3 vertebral body. No discernible lesion aside from subtle sclerosis at this location. No visible paraspinal soft tissue.   New activity in the subjacent L4 vertebral body, new site of disease versus worsening of adjacent degenerative change.   MRI  could be considered for further evaluation of above findings as warranted for direct comparison with previous MR imaging.   Focal increased metabolic uptake about the RIGHT shoulder without visible lesion on CT is of uncertain significance, attention on follow-up.   New area of nodularity about the RIGHT mid chest along the anterior pleural surface without associated increased metabolic activity. Suggest consideration for short interval follow-up.   Cholelithiasis and aortic atherosclerosis.   Aortic Atherosclerosis (ICD10-I70.0).   11/27/2020 Imaging   1. Persistent T1 hypointense, enhancing lesion on the left side of the L3 vertebral body with new enhancement in the superior endplate of the L4 vertebral body on the left, corresponding to areas of increased FDG uptake on recent FDG PET, concerning for metastatic disease. 2. Left paraspinal soft tissue lesion at  the L3 level is unchanged. 3. Moderate spinal canal stenosis and severe left neural foraminal narrowing at L3-4 and L4-5.   Metastatic cancer to bone ([place])  06/27/2014 Initial Diagnosis   Metastatic cancer to bone    Primary lung cancer, right ([place])  11/04/2018 Initial Diagnosis   Primary lung cancer, right ([place])   11/11/2018 PET scan   1. Low level metabolism (max SUV 2.0) associated with the irregular subsolid superior segment right lower lobe 2.1 cm pulmonary nodule. As better depicted on the recent diagnostic chest CT study, this nodule crosses the major fissure into the right upper lobe and has increased in size and density on multiple imaging studies back to 2009. Findings are most suggestive of primary bronchogenic adenocarcinoma. 2. No hypermetabolic thoracic adenopathy. 3. Persistent hypermetabolism (max SUV 8.0) associated with the known left L3-4 paraspinous metastasis, mildly decreased in metabolism since 02/12/2016 PET-CT. 4. No additional sites of hypermetabolic metastatic disease in the abdomen or pelvis. No metabolic evidence of peritoneal recurrence. No ascites. 5. Chronic findings include: Aortic Atherosclerosis (ICD10-I70.0). Coronary atherosclerosis. Chronic bilateral maxillary sinusitis. Cholelithiasis. Moderate sigmoid diverticulosis.   12/07/2018 Pathology Results   1. Lung, resection (segmental or lobe), Right Lobe Superior with endblock wedge of right upper lobe - INVASIVE ADENOCARCINOMA, MODERATELY DIFFERENTIATED, SPANNING 1.6 CM. - THE SURGICAL RESECTION MARGINS ARE NEGATIVE FOR CARCINOMA. - SEE ONCOLOGY TABLE BELOW. 2. Lymph node, biopsy, Level 9 #1 - THERE IS NO EVIDENCE OF CARCINOMA IN 1 OF 1 LYMPH NODE (0/1). 3. Lymph node, biopsy, Level 9 #2 - THERE IS NO EVIDENCE OF CARCINOMA IN 1 OF 1 LYMPH NODE (0/1). 4. Lymph node, biopsy, Level 7 #1 - METASTATIC CARCINOMA IN 1 OF 1 LYMPH NODE (1/1). 5. Lymph node, biopsy, Level 7 #2 - THERE IS NO EVIDENCE OF CARCINOMA  IN 1 OF 1 LYMPH NODE (0/1). 6. Lymph node, biopsy, Level 12 #1 - THERE IS NO EVIDENCE OF CARCINOMA IN 1 OF 1 LYMPH NODE (0/1). 7. Lymph node, biopsy, Level 12 #2 - THERE IS NO EVIDENCE OF CARCINOMA IN 1 OF 1 LYMPH NODE (0/1). 8. Lymph node, biopsy, Level 10 #1 - THERE IS NO EVIDENCE OF CARCINOMA IN 1 OF 1 LYMPH NODE (0/1). 9. Lymph node, biopsy, Level 4R #1 - THERE IS NO EVIDENCE OF CARCINOMA IN 1 OF 1 LYMPH NODE (0/1). 10. Lymph node, biopsy, Level 4R #2 - THERE IS NO EVIDENCE OF CARCINOMA IN 1 OF 1 LYMPH NODE (0/1). 11. Lymph node, biopsy, Level 2R #1 - THERE IS NO EVIDENCE OF CARCINOMA IN 1 OF 1 LYMPH NODE (0/1). 12. Lung, resection (segmental or lobe), Right Lobe Superior - BENIGN LUNG PARENCHYMA. - THERE IS NO EVIDENCE OF MALIGNANCY.  Procedure: Segmentectomy. Specimen Laterality: Right. Tumor Site: Right superior lobe. Tumor Size: 1.6 cm (gross measurement). Tumor Focality: Unifocal. Histologic Type: Adenocarcinoma, moderately differentiated. Visceral Pleura Invasion: Not identified. Lymphovascular Invasion: Not identified. Direct Invasion of Adjacent Structures: Not identified. Margins: Negative for carcinoma. Treatment Effect: N/A Regional Lymph Nodes: Number of Lymph Nodes Involved: 1 (level 7) Number of Lymph Nodes Examined: 10 Pathologic Stage Classification (pTNM, AJCC 8th Edition): pT1b, pN2 Ancillary Studies: The tumor cells are positive for Napsin-A, TTF-1, and p53. They are essentially negative for estrogen receptor, PAX-8 and progesterone receptor. Additional studies can be performed upon clinician request. Representative Tumor Block: 1D-1E. (JBK:gt, 12/09/18)   12/07/2018 Surgery   PREOPERATIVE DIAGNOSIS:  Right lung nodule.   POSTOPERATIVE DIAGNOSIS:  Non-small cell carcinoma- suspected primary lung carcinoma, clinical stage T2N01b.   PROCEDURE:   Right video-assisted thoracoscopy, Right lower lobe superior segmentectomy with en bloc wedge resection of right  upper lobe, Lymph node dissection, Intercostal nerve block levels 3-9.   SURGEON:  Modesto Charon, MD   FINDINGS:  Mass palpable in posterior aspect of superior segment of right lower lobe, likely involvement across fissure into the upper lobe.  Frozen section revealed non-small cell carcinoma. upper and lower lobe margins clear.   12/23/2018 Cancer Staging   Staging form: Lung, AJCC 8th Edition - Pathologic: Stage IIIA (pT1b, pN2, cM0) - Signed by Heath Lark, MD on 12/23/2018    01/05/2019 Imaging   MRI brain 1. No metastatic disease or acute intracranial abnormality identified. 2. Small chronic parafalcine meningioma size is stable since that described in 2006 (12 x 15 mm). 3. Advanced signal changes in the cerebral white matter and to a lesser extent pons, nonspecific but most commonly due to chronic small vessel disease. 4. Partially visible degenerative cervical spinal stenosis.     01/07/2019 -  Chemotherapy   The patient had carboplatin and Alimta for chemotherapy treatment.     04/14/2019 PET scan   1. 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 As shown on recent MRI of 04/01/2019, there is a new component of invasion into the left lower L3 vertebral body. This new component has a maximum SUV of 10.0, compatible with active malignancy. 2. Interval wedge resection of portions of the right upper lobe and right lower lobe at the site of the prior nodule. There is only minimal low-grade activity along the wedge resection clips, maximum SUV of 1.5. Surveillance is likely warranted. No new nodule identified. 3. Other imaging findings of potential clinical significance: Aortic Atherosclerosis (ICD10-I70.0). Coronary atherosclerosis. Thoracolumbar scoliosis. Cholelithiasis. Sigmoid colon diverticulosis.     05/03/2019 -  Chemotherapy   The patient had Pembrolizumab and Lenvima for chemotherapy treatment.     05/22/2019 Imaging   Ct head Atrophy, chronic  microvascular disease.   No acute intracranial abnormality.     03/09/2020 PET scan   1. No evidence of malignant disease progression. 2. Similar mild metabolic activity within at the sclerotic L3 vertebral body lesion. Reduction in paraspinal muscular metabolic activity at L3 vertebral body level.     REVIEW OF SYSTEMS:   Constitutional: Denies fevers, chills or abnormal weight loss Eyes: Denies blurriness of vision Ears, nose, mouth, throat, and face: Denies mucositis or sore throat Respiratory: Denies cough, dyspnea or wheezes Cardiovascular: Denies palpitation, chest discomfort or lower extremity swelling Gastrointestinal:  Denies nausea, heartburn or change in bowel habits Skin: Denies abnormal skin rashes Lymphatics: Denies new lymphadenopathy or easy bruising Neurological:Denies numbness, tingling or new weaknesses  Behavioral/Psych: Mood is stable, no new changes  All other systems were reviewed with the patient and are negative.  I have reviewed the past medical history, past surgical history, social history and family history with the patient and they are unchanged from previous note.  ALLERGIES:  is allergic to ace inhibitors, dilaudid [hydromorphone hcl], and codeine.  MEDICATIONS:  Current Outpatient Medications  Medication Sig Dispense Refill   atenolol (TENORMIN) 25 MG tablet Take 25 mg by mouth 2 (two) times daily.     BIOTIN PO Take 500 mcg by mouth daily.     calcium carbonate (TUMS - DOSED IN MG ELEMENTAL CALCIUM) 500 MG chewable tablet Chew 1 tablet by mouth daily. 1200 mg daily     cetirizine (ZYRTEC) 10 MG tablet Take 10 mg by mouth daily.     cholecalciferol (VITAMIN D3) 25 MCG (1000 UNIT) tablet Take 10,000 Units by mouth daily.     donepezil (ARICEPT) 5 MG tablet Take 1 tablet (5 mg total) by mouth at bedtime. 30 tablet 5   furosemide (LASIX) 20 MG tablet Take 1 tablet (20 mg total) by mouth daily. (Patient taking differently: Take 20 mg by mouth daily.  Take 2 tabs on a Tuesday/Thursday and Saturday/ 1 tablet on the other days.) 10 tablet 0   hydrALAZINE (APRESOLINE) 25 MG tablet Take 25 mg by mouth daily. One tablet Mid afternoon     hydrALAZINE (APRESOLINE) 50 MG tablet Take 50 mg by mouth 2 (two) times daily.     lenvatinib 10 mg daily dose (LENVIMA) capsule Take 1 capsule (10 mg total) by mouth daily. 30 capsule 11   levothyroxine (SYNTHROID) 50 MCG tablet Take 1 tablet (50 mcg total) by mouth daily before breakfast. 30 tablet 3   memantine (NAMENDA) 5 MG tablet TAKE 2 TABLETS BY MOUTH TWICE A DAY 360 tablet 1   Multiple Vitamin (MULTIVITAMIN WITH MINERALS) TABS tablet Take 1 tablet by mouth daily.     oxyCODONE (OXY IR/ROXICODONE) 5 MG immediate release tablet Take 1 tablet (5 mg total) by mouth every 6 (six) hours as needed for severe pain. 60 tablet 0   Potassium 99 MG TABS Take 1 tablet by mouth daily.     rivaroxaban (XARELTO) 10 MG TABS tablet Take 1 tablet (10 mg total) by mouth daily. 30 tablet 11   simvastatin (ZOCOR) 20 MG tablet Take 20 mg by mouth every evening.      No current facility-administered medications for this visit.   Facility-Administered Medications Ordered in Other Visits  Medication Dose Route Frequency Provider Last Rate Last Admin   heparin lock flush 100 unit/mL  500 Units Intracatheter Once Bertis Ruddy, December Hedtke, MD       sodium chloride flush (NS) 0.9 % injection 10 mL  10 mL Intracatheter Once Bertis Ruddy, Kendra Woolford, MD        PHYSICAL EXAMINATION: ECOG PERFORMANCE STATUS: 2 - Symptomatic, <50% confined to bed  Vitals:   11/28/20 1357  BP: (!) 151/86  Pulse: 78  Resp: 18  Temp: 98.2 F (36.8 C)  SpO2: 96%   Filed Weights   11/28/20 1357  Weight: 118 lb (53.5 kg)    GENERAL:alert, no distress and comfortable Musculoskeletal:no cyanosis of digits and no clubbing  NEURO: alert & oriented x 3 with fluent speech, no focal motor/sensory deficits  LABORATORY DATA:  I have reviewed the data as listed    Component  Value Date/Time   NA 143 11/21/2020 1332   NA 140 05/09/2017 0844  K 3.7 11/21/2020 1332   K 4.1 05/09/2017 0844   CL 101 11/21/2020 1332   CO2 31 11/21/2020 1332   CO2 26 05/09/2017 0844   GLUCOSE 88 11/21/2020 1332   GLUCOSE 82 05/09/2017 0844   BUN 26 (H) 11/21/2020 1332   BUN 20.7 05/09/2017 0844   CREATININE 1.12 (H) 11/21/2020 1332   CREATININE 0.7 05/09/2017 0844   CALCIUM 9.7 11/21/2020 1332   CALCIUM 9.3 05/09/2017 0844   PROT 7.4 11/21/2020 1332   PROT 6.8 05/09/2017 0844   ALBUMIN 3.5 11/21/2020 1332   ALBUMIN 3.6 05/09/2017 0844   AST 26 11/21/2020 1332   AST 27 05/09/2017 0844   ALT 17 11/21/2020 1332   ALT 17 05/09/2017 0844   ALKPHOS 53 11/21/2020 1332   ALKPHOS 43 05/09/2017 0844   BILITOT 0.6 11/21/2020 1332   BILITOT 0.81 05/09/2017 0844   GFRNONAA 50 (L) 11/21/2020 1332   GFRAA >60 02/07/2020 1109    No results found for: SPEP, UPEP  Lab Results  Component Value Date   WBC 8.0 11/21/2020   NEUTROABS 5.0 11/21/2020   HGB 12.7 11/21/2020   HCT 38.4 11/21/2020   MCV 99.7 11/21/2020   PLT 216 11/21/2020      Chemistry      Component Value Date/Time   NA 143 11/21/2020 1332   NA 140 05/09/2017 0844   K 3.7 11/21/2020 1332   K 4.1 05/09/2017 0844   CL 101 11/21/2020 1332   CO2 31 11/21/2020 1332   CO2 26 05/09/2017 0844   BUN 26 (H) 11/21/2020 1332   BUN 20.7 05/09/2017 0844   CREATININE 1.12 (H) 11/21/2020 1332   CREATININE 0.7 05/09/2017 0844      Component Value Date/Time   CALCIUM 9.7 11/21/2020 1332   CALCIUM 9.3 05/09/2017 0844   ALKPHOS 53 11/21/2020 1332   ALKPHOS 43 05/09/2017 0844   AST 26 11/21/2020 1332   AST 27 05/09/2017 0844   ALT 17 11/21/2020 1332   ALT 17 05/09/2017 0844   BILITOT 0.6 11/21/2020 1332   BILITOT 0.81 05/09/2017 0844       RADIOGRAPHIC STUDIES: I have reviewed multiple imaging studies with the patient and her husband I have personally reviewed the radiological images as listed and agreed with the  findings in the report. MR LUMBAR SPINE W WO CONTRAST  Result Date: 11/27/2020 CLINICAL DATA:  Endometrial cancer.  Metastatic cancer to the bone. EXAM: MRI LUMBAR SPINE WITHOUT AND WITH CONTRAST TECHNIQUE: Multiplanar and multiecho pulse sequences of the lumbar spine were obtained without and with intravenous contrast. CONTRAST:  52mL GADAVIST GADOBUTROL 1 MMOL/ML IV SOLN COMPARISON:  MRI of the lumbar spine July 15, 2019. FDG PET November 15, 2020. FINDINGS: Segmentation:  Standard. Alignment: Stable 6 mm anterolisthesis of L4 over L5. "S" shaped thoracolumbar scoliosis. Vertebrae: T1 hypointense, enhancing lesion is seen involving the left side of the L3 vertebral body extending into the corresponding posterior elements, concerning for malignancy, appear similar to prior MRI. However, new T1 hypointense lesion is seen in the superior aspect of the L4 vertebral body on the left extending to the correspond helical, also concerning for malignancy.Edema and contrast enhancement in the T12 and L1 opposing endplates on the right appear degenerative. Diffuse decreased T1 signal in the remainder of the lumbar spine is related to prior radiotherapy. Conus medullaris and cauda equina: Conus extends to the L1-2 level. Conus and cauda equina appear normal. Paraspinal and other soft tissues: No significant interval change of  the paraspinal mass to the left of the L3 vertebral body extending into the corresponding neural foramen measuring approximately 2.0 x 1.5 cm (series 11, image 29). Disc levels: T10-11: No spinal canal or neural foraminal stenosis. T11 12: No spinal canal or neural foraminal stenosis. T12-L1: Right foraminal disc osteophyte complex and mild facet degenerative changes resulting in mild right neural foraminal narrowing. No spinal canal stenosis. L1-2: Loss of disc height, disc bulge with a right foraminal/far lateral disc osteophyte complex and facet degenerative changes resulting in narrowing of the right  subarticular zone and moderate right neural foraminal narrowing. L2-3: Disc bulge with a right foraminal/far lateral disc osteophyte complex and facet degenerative changes resulting in mild right neural foraminal narrowing. No spinal canal stenosis. L3-4 disc bulge, facet degenerative changes and right foraminal mass lesion, as described above resulting in moderate spinal canal stenosis with effacement of the left subarticular zone and severe left neural foraminal narrowing. L4-5: Disc bulge/disc uncovering, prominent hypertrophic facet degenerative changes and ligamentum flavum redundancy resulting in moderate spinal canal stenosis with narrowing of the bilateral subarticular zones, left greater than right, and severe left neural foraminal narrowing. L5-S1: Shallow disc bulge and moderate facet degenerative changes. No significant spinal canal or neural foraminal stenosis. IMPRESSION: 1. Persistent T1 hypointense, enhancing lesion on the left side of the L3 vertebral body with new enhancement in the superior endplate of the L4 vertebral body on the left, corresponding to areas of increased FDG uptake on recent FDG PET, concerning for metastatic disease. 2. Left paraspinal soft tissue lesion at the L3 level is unchanged. 3. Moderate spinal canal stenosis and severe left neural foraminal narrowing at L3-4 and L4-5. Electronically Signed   By: Pedro Earls M.D.   On: 11/27/2020 14:03   NM PET Image Restage (PS) Skull Base to Thigh  Result Date: 11/16/2020 CLINICAL DATA:  Subsequent treatment strategy for non-small cell lung cancer, metastatic in an 80 year old female. EXAM: NUCLEAR MEDICINE PET SKULL BASE TO THIGH TECHNIQUE: 6.73 mCi F-18 FDG was injected intravenously. Full-ring PET imaging was performed from the skull base to thigh after the radiotracer. CT data was obtained and used for attenuation correction and anatomic localization. Fasting blood glucose: 92 mg/dl COMPARISON:  Multiple prior  studies, most recent of April of 2022. FINDINGS: Mediastinal blood pool activity: SUV max 1.82 Liver activity: SUV max NA NECK: No hypermetabolic lymph nodes in the neck. Incidental CT findings: none CHEST: No hypermetabolic mediastinal or hilar nodes. No suspicious pulmonary nodules on the CT scan. Incidental CT findings: Calcified atheromatous plaque in the thoracic aorta. Normal heart size without pericardial effusion. Signs of three-vessel coronary artery disease. Normal caliber of central pulmonary vasculature. LEFT-sided Port-A-Cath terminates at the midportion of the superior vena cava as before. Signs of RIGHT axillary dissection similar to prior imaging. Evidence of partial lung resection in the RIGHT chest without change. No sign of consolidation or evidence of pleural effusion. New area of nodularity measuring 4 mm along the pleural surface in the RIGHT mid chest (image 38/8) this is not associated with increased metabolic activity. ABDOMEN/PELVIS: The LEFT common iliac lymph nodes seen on the previous study has resolved nearly completely faintly visualize soft tissue on image 121 of series 4 measuring approximately 3 mm is all that remains. No signs of FDG uptake above blood pool in this location Additional small lymph node also with resolution of FDG uptake (image 128/4) maximum SUV of approximately 1.3 as compared to 5.6 on the prior study measuring  3 mm as compared to 7 mm short axis. No signs of solid organ disease or additional evidence of nodal disease. Incidental CT findings: Cholelithiasis. No acute findings relative to liver, gallbladder, spleen, pancreas, adrenal glands or kidneys. Urinary bladder is decompressed without perivesical stranding. Signs of colonic diverticulosis. No acute gastrointestinal process. Aortic atherosclerosis with calcification. No aneurysmal dilation. Smooth contour of the IVC. No pelvic lymphadenopathy. Post hysterectomy. SKELETON: Increased metabolic activity about  the L3 vertebral body, area increased since previous imaging with a maximum SUV of 14.1 as compared to 7.9 with continued worsening over the last 2 exams. This area is larger on FDG portion of the exam measuring up to 2.3 cm with respect to the area of the activity previously less than a cm. Subtle sclerosis is noted in this area without frank destruction. There is associated degenerative change. Small area in the L4 with increased metabolic activity is now apparent along the superior endplate. No additional hypermetabolic bony abnormalities. Mild increased metabolic activity about the RIGHT shoulder without corresponding visible lesion is favored to represent degenerative related changes. Incidental CT findings: Marked spinal degenerative changes and levoconvex rotary curvature in the lumbar spine. IMPRESSION: Resolution of activity associated with LEFT pelvic lymph nodes compared to previous imaging but with worsening of FDG uptake at the site of previous disease in the L3 vertebral body. No discernible lesion aside from subtle sclerosis at this location. No visible paraspinal soft tissue. New activity in the subjacent L4 vertebral body, new site of disease versus worsening of adjacent degenerative change. MRI could be considered for further evaluation of above findings as warranted for direct comparison with previous MR imaging. Focal increased metabolic uptake about the RIGHT shoulder without visible lesion on CT is of uncertain significance, attention on follow-up. New area of nodularity about the RIGHT mid chest along the anterior pleural surface without associated increased metabolic activity. Suggest consideration for short interval follow-up. Cholelithiasis and aortic atherosclerosis. Aortic Atherosclerosis (ICD10-I70.0). Electronically Signed   By: Zetta Bills M.D.   On: 11/16/2020 12:01

## 2020-11-28 NOTE — Assessment & Plan Note (Signed)
She has significant cognitive decline She is on Namenda She has appointment to see neurologist  Her husband is making sure that the patient is taking Lenvima correctly as prescribed

## 2020-11-28 NOTE — Assessment & Plan Note (Signed)
She brought a long documented blood pressure from home There is 1 time on July 14, her systolic blood pressure was 171 around 6:30 PM On July 15, her morning blood pressure was elevated at 171 Majority of all her other blood pressure measurement were within normal range I will defer to her primary care doctor for further management

## 2020-11-28 NOTE — Assessment & Plan Note (Signed)
She is taking oxycodone on a regular basis I refilled her prescription 2 weeks ago but according to the patient's husband, he was not able to get her medicine refills With assistant from a nursing staff, were able to get her pain medicine refilled today

## 2020-11-28 NOTE — Assessment & Plan Note (Signed)
I have reviewed multiple imaging studies with the patient and her husband Her recent PET CT scan suggested positive response everywhere else except for her bone The MRI that was done recently is compared with MRI over a year ago which showed new changes She does have back pain but currently is well controlled with pain medicine Overall, I do not believe she progress on her current treatment The progression seen on her back likely happen when she stopped treatment several months ago For now, I recommend we continue prescribed pembrolizumab with Lenvima I plan to order PET CT scan and MRI together in the future for better assessment of response to therapy

## 2020-12-02 ENCOUNTER — Other Ambulatory Visit: Payer: Self-pay | Admitting: Neurology

## 2020-12-06 ENCOUNTER — Ambulatory Visit: Payer: Medicare Other | Admitting: Neurology

## 2020-12-12 ENCOUNTER — Other Ambulatory Visit: Payer: Self-pay

## 2020-12-12 ENCOUNTER — Inpatient Hospital Stay: Payer: Medicare Other | Attending: Hematology and Oncology

## 2020-12-12 ENCOUNTER — Inpatient Hospital Stay: Payer: Medicare Other

## 2020-12-12 ENCOUNTER — Inpatient Hospital Stay (HOSPITAL_BASED_OUTPATIENT_CLINIC_OR_DEPARTMENT_OTHER): Payer: Medicare Other | Admitting: Hematology and Oncology

## 2020-12-12 DIAGNOSIS — C541 Malignant neoplasm of endometrium: Secondary | ICD-10-CM | POA: Insufficient documentation

## 2020-12-12 DIAGNOSIS — M47817 Spondylosis without myelopathy or radiculopathy, lumbosacral region: Secondary | ICD-10-CM | POA: Insufficient documentation

## 2020-12-12 DIAGNOSIS — M48061 Spinal stenosis, lumbar region without neurogenic claudication: Secondary | ICD-10-CM | POA: Insufficient documentation

## 2020-12-12 DIAGNOSIS — Z7189 Other specified counseling: Secondary | ICD-10-CM

## 2020-12-12 DIAGNOSIS — I10 Essential (primary) hypertension: Secondary | ICD-10-CM | POA: Diagnosis not present

## 2020-12-12 DIAGNOSIS — C7951 Secondary malignant neoplasm of bone: Secondary | ICD-10-CM

## 2020-12-12 DIAGNOSIS — I700 Atherosclerosis of aorta: Secondary | ICD-10-CM | POA: Insufficient documentation

## 2020-12-12 DIAGNOSIS — Z85118 Personal history of other malignant neoplasm of bronchus and lung: Secondary | ICD-10-CM | POA: Insufficient documentation

## 2020-12-12 DIAGNOSIS — S81012A Laceration without foreign body, left knee, initial encounter: Secondary | ICD-10-CM | POA: Diagnosis not present

## 2020-12-12 DIAGNOSIS — D701 Agranulocytosis secondary to cancer chemotherapy: Secondary | ICD-10-CM

## 2020-12-12 DIAGNOSIS — Z7901 Long term (current) use of anticoagulants: Secondary | ICD-10-CM | POA: Diagnosis not present

## 2020-12-12 DIAGNOSIS — M4316 Spondylolisthesis, lumbar region: Secondary | ICD-10-CM | POA: Insufficient documentation

## 2020-12-12 DIAGNOSIS — M47816 Spondylosis without myelopathy or radiculopathy, lumbar region: Secondary | ICD-10-CM | POA: Diagnosis not present

## 2020-12-12 DIAGNOSIS — G893 Neoplasm related pain (acute) (chronic): Secondary | ICD-10-CM

## 2020-12-12 DIAGNOSIS — Z79899 Other long term (current) drug therapy: Secondary | ICD-10-CM | POA: Diagnosis not present

## 2020-12-12 DIAGNOSIS — M5136 Other intervertebral disc degeneration, lumbar region: Secondary | ICD-10-CM | POA: Diagnosis not present

## 2020-12-12 DIAGNOSIS — Z885 Allergy status to narcotic agent status: Secondary | ICD-10-CM | POA: Diagnosis not present

## 2020-12-12 DIAGNOSIS — Y9301 Activity, walking, marching and hiking: Secondary | ICD-10-CM | POA: Diagnosis not present

## 2020-12-12 DIAGNOSIS — Z95828 Presence of other vascular implants and grafts: Secondary | ICD-10-CM

## 2020-12-12 DIAGNOSIS — M2578 Osteophyte, vertebrae: Secondary | ICD-10-CM | POA: Insufficient documentation

## 2020-12-12 DIAGNOSIS — Z5112 Encounter for antineoplastic immunotherapy: Secondary | ICD-10-CM | POA: Insufficient documentation

## 2020-12-12 DIAGNOSIS — L989 Disorder of the skin and subcutaneous tissue, unspecified: Secondary | ICD-10-CM | POA: Diagnosis not present

## 2020-12-12 DIAGNOSIS — Z853 Personal history of malignant neoplasm of breast: Secondary | ICD-10-CM

## 2020-12-12 DIAGNOSIS — M549 Dorsalgia, unspecified: Secondary | ICD-10-CM | POA: Insufficient documentation

## 2020-12-12 DIAGNOSIS — R296 Repeated falls: Secondary | ICD-10-CM | POA: Diagnosis not present

## 2020-12-12 DIAGNOSIS — I2510 Atherosclerotic heart disease of native coronary artery without angina pectoris: Secondary | ICD-10-CM | POA: Insufficient documentation

## 2020-12-12 LAB — CBC WITH DIFFERENTIAL (CANCER CENTER ONLY)
Abs Immature Granulocytes: 0.06 10*3/uL (ref 0.00–0.07)
Basophils Absolute: 0 10*3/uL (ref 0.0–0.1)
Basophils Relative: 1 %
Eosinophils Absolute: 0.1 10*3/uL (ref 0.0–0.5)
Eosinophils Relative: 2 %
HCT: 38.3 % (ref 36.0–46.0)
Hemoglobin: 12.9 g/dL (ref 12.0–15.0)
Immature Granulocytes: 1 %
Lymphocytes Relative: 10 %
Lymphs Abs: 0.8 10*3/uL (ref 0.7–4.0)
MCH: 33.3 pg (ref 26.0–34.0)
MCHC: 33.7 g/dL (ref 30.0–36.0)
MCV: 99 fL (ref 80.0–100.0)
Monocytes Absolute: 1.2 10*3/uL — ABNORMAL HIGH (ref 0.1–1.0)
Monocytes Relative: 15 %
Neutro Abs: 6 10*3/uL (ref 1.7–7.7)
Neutrophils Relative %: 71 %
Platelet Count: 190 10*3/uL (ref 150–400)
RBC: 3.87 MIL/uL (ref 3.87–5.11)
RDW: 12.9 % (ref 11.5–15.5)
WBC Count: 8.2 10*3/uL (ref 4.0–10.5)
nRBC: 0 % (ref 0.0–0.2)

## 2020-12-12 LAB — CMP (CANCER CENTER ONLY)
ALT: 21 U/L (ref 0–44)
AST: 37 U/L (ref 15–41)
Albumin: 3.5 g/dL (ref 3.5–5.0)
Alkaline Phosphatase: 58 U/L (ref 38–126)
Anion gap: 14 (ref 5–15)
BUN: 18 mg/dL (ref 8–23)
CO2: 28 mmol/L (ref 22–32)
Calcium: 10.2 mg/dL (ref 8.9–10.3)
Chloride: 100 mmol/L (ref 98–111)
Creatinine: 1.05 mg/dL — ABNORMAL HIGH (ref 0.44–1.00)
GFR, Estimated: 54 mL/min — ABNORMAL LOW (ref >60)
Glucose, Bld: 108 mg/dL — ABNORMAL HIGH (ref 70–99)
Potassium: 3.7 mmol/L (ref 3.5–5.1)
Sodium: 142 mmol/L (ref 135–145)
Total Bilirubin: 0.9 mg/dL (ref 0.3–1.2)
Total Protein: 7.3 g/dL (ref 6.5–8.1)

## 2020-12-12 LAB — TSH: TSH: 1.8 u[IU]/mL (ref 0.350–4.500)

## 2020-12-12 LAB — TOTAL PROTEIN, URINE DIPSTICK: Protein, ur: NEGATIVE mg/dL

## 2020-12-12 MED ORDER — SODIUM CHLORIDE 0.9 % IV SOLN
Freq: Once | INTRAVENOUS | Status: AC
  Filled 2020-12-12: qty 250

## 2020-12-12 MED ORDER — SODIUM CHLORIDE 0.9% FLUSH
10.0000 mL | Freq: Once | INTRAVENOUS | Status: AC
  Administered 2020-12-12: 10 mL
  Filled 2020-12-12: qty 10

## 2020-12-12 MED ORDER — ZOLEDRONIC ACID 4 MG/5ML IV CONC
3.0000 mg | Freq: Once | INTRAVENOUS | Status: AC
  Administered 2020-12-12: 3 mg via INTRAVENOUS
  Filled 2020-12-12: qty 3.75

## 2020-12-12 MED ORDER — HEPARIN SOD (PORK) LOCK FLUSH 100 UNIT/ML IV SOLN
500.0000 [IU] | Freq: Once | INTRAVENOUS | Status: AC | PRN
  Administered 2020-12-12: 500 [IU]
  Filled 2020-12-12: qty 5

## 2020-12-12 MED ORDER — SODIUM CHLORIDE 0.9% FLUSH
10.0000 mL | INTRAVENOUS | Status: DC | PRN
  Administered 2020-12-12: 10 mL
  Filled 2020-12-12: qty 10

## 2020-12-12 MED ORDER — SODIUM CHLORIDE 0.9 % IV SOLN
200.0000 mg | Freq: Once | INTRAVENOUS | Status: AC
  Administered 2020-12-12: 200 mg via INTRAVENOUS
  Filled 2020-12-12: qty 8

## 2020-12-13 ENCOUNTER — Encounter: Payer: Self-pay | Admitting: Hematology and Oncology

## 2020-12-13 LAB — T4: T4, Total: 9.3 ug/dL (ref 4.5–12.0)

## 2020-12-13 NOTE — Progress Notes (Signed)
[place] OFFICE PROGRESS NOTE  Patient Care Team: Shon Baton, MD as PCP - General (Internal Medicine) Heath Lark, MD as Consulting Physician (Hematology and Oncology)  ASSESSMENT & PLAN:  Endometrial cancer [facility]) Her blood pressure fluctuated up and down Her pain is difficult to assess because according to the patient, her pain is not too bad but according to her husband, her pain is pretty bad We will proceed with treatment today She will continue aggressive blood pressure monitoring and management with her primary care doctor  Cancer associated pain She is taking oxycodone on a regular basis She denies constipation She will continue current prescribed dose of pain medicine  Essential hypertension I have reviewed her documented blood pressure Her blood pressure fluctuated widely with numbers as low as 102/63 to as high as 194/107 She will continue aggressive blood pressure management through her primary care doctor  Metastatic cancer to bone [facility]) She has no recent dental issues She is on calcium and vitamin D and is due for Zometa  No orders of the defined types were placed in this encounter.   All questions were answered. The patient knows to call the clinic with any problems, questions or concerns. The total time spent in the appointment was 25 minutes encounter with patients including review of chart and various tests results, discussions about plan of care and coordination of care plan   Heath Lark, MD 12/13/2020 8:38 AM  INTERVAL HISTORY: Please see below for problem oriented charting. She returns with her husband for further follow-up She feels fine She brought with her documented blood pressure from home over the past week She denies any headache or changes in the vision No recent falls She has intermittent back pain and according to the patient, her pain is well controlled  SUMMARY OF ONCOLOGIC HISTORY: Oncology History Overview Note  Biopsy  of recurrence 06-2014 ER negative. PDL1 testing low at 2% on the uterine cancer She progressed on carboplatin and Paclitaxel for uterine cancer HER 2 negative on pathology [id number] Negative genetic testing  On her lung cancer sample, [employer] [facility] testing revealed 10% PD-1 positive EGFR mutation was detected, exon 19 deletion   Endometrial cancer ([place])  12/01/2007 Initial Diagnosis   She has recurrent papillary serous endometrial carcinoma. Briefly she was diagnosed in 2009 with a FIGO IB pappilary serous carcinoma treated with hysterectomy followed by adjuvant chemo and vaginal brachytherapy. She then had a recurrence diagnosed in late 2012 that was deemed not to be surgically resectable, and was treated with 6 cycles of carbo/taxol followed by 5040 cGy of EBRT to the pelvic mass. She was then followed with serial imaging and was noted in June of this year to have slight increase in the size of the mass, and again in September there was small enlargement of the mass. She had a re-staging PET scan on 03/10/13 that showed FDG activity in the pelvic mass, with no other areas of disease    12/01/2007 Imaging   CT scan of chest, abdomen and pelvis: 1.  Mass like area of decreased attenuation in the central uterus, consistent with the known endometrial carcinoma.  Deep myometrial invasion is suspected.  Pelvic MRI without and with contrast could be performed for further staging workup if clinically warranted. 2.  No evidence of extrauterine extension or lymphadenopathy. 3.  Sigmoid diverticulosis incidentally noted.    12/15/2007 Surgery   --12/2007 laparoscopic hysterectomy and PLND --05/2008 complted 6 cycles adjuvant carbo/taxol followed by vaginal cuff brachy --03/2011  exploratory lararoscopy, ureterolysis --4/13 completed 6 cycels carbotaxol --8/13 completed EBRT to the left pelvic sidewall    06/10/2008 Imaging   CT scan abdomen and pelvis 1.  Nonspecific mildly prominent inguinal lymph  nodes, right greater than left. 2.  Interval hysterectomy without evidence of recurrent pelvic mass or fluid collection.    03/22/2011 Imaging   Ct scan abdomen and pelvis 1.  Local uterine cancer recurrence with two large nodes along the left pelvic sidewall. 2.  No evidence of bowel obstruction, urinary obstruction, or more distant metastasis.    03/31/2011 Relapse/Recurrence   chemotherapy and external beam radiation    05/16/2011 - 09/17/2011 Chemotherapy   She had 6 cycles of carboplatin and Taxol     07/17/2011 Imaging   Ct scan abdomen and pelvis 1.  Interval improvement in the previously demonstrated left pelvic local recurrence of tumor. 2.  No disease progression or complication identified. 3.  Mild bladder wall thickening on the right, nonspecific and possibly related to incomplete distension and/or radiation therapy. 4.  Cholelithiasis    10/11/2011 Imaging   CT scan abdomen and pelvis 1.  Interval enlargement of the left pelvic sidewall masses. 2.  No new nodular disease in the pelvis or lymphadenopathy. 3.  No evidence  of distant metastasis. 4.  Mild hydronephrosis on the right is similar to prior. 5.  Focal thickening of the right aspect the bladder is stable compared to prior.      10/28/2011 - 12/05/2011 Radiation Therapy   radiation for pelvic sidewall recurrence    10/28/2011 - 12/05/2011 Radiation Therapy   10/28/11-12/05/11: Radiotherapy to the left pelvic sidewall    03/18/2012 Imaging   CT abdomen 1.  Today's study demonstrates a positive response to therapy with decreased size of left pelvic sidewall nodal masses, as detailed above.  No new soft tissue masses or new lymphadenopathy is identified within the abdomen or pelvis. Continue attention on follow-up studies is recommended. 2.  Cholelithiasis without findings to suggest acute cholecystitis.  3.  Colonic diverticulosis without findings to suggest acute diverticulitis at this time. 4.  Mild asymmetric  urinary bladder wall thickening is unchanged compared to prior examinations and is nonspecific.  No definite bladder wall mass is identified at this time. 5.  Additional findings, similar to prior examinations, as above    06/22/2012 Imaging   CT abdomen 1.  Further decreased size of the left pelvic peripherally enhancing lesion. 2.  No new sites of new or progressive disease. 3. Esophageal air fluid level suggests dysmotility or gastroesophageal reflux. 4.  Cholelithiasis    11/02/2012 Imaging   CT abdomen 1.  The left pelvic side wall lesion demonstrates mild increase in size from previous exam. 2.  No new sites of new or progressive disease. 3.  Cholelithiasis.      01/28/2013 Imaging   CT abdomen 1. Interval small increase in volume of enhancing mass adjacent to the left pelvic sidewall. 2. No evidence of abdominal or pelvic lymphadenopathy    04/02/2013 Surgery   pelvic mass resection with IORT    04/02/2013 - 04/02/2013 Radiation Therapy   04/02/13: Intraoperative radiotherapy to the left pelvis    05/10/2013 PET scan   1. Hypermetabolic mass in the deep left pelvis consistent with metastasis. 2. No additional evidence of local metastasis. No evidence of distant metastasis. 3. Small right lower lobe pulmonary nodule is not hypermetabolic.    07/27/2013 Imaging   No evidence of recurrent or metastatic disease. Apparent prior resection  of the deep left pelvic metastasis.    02/08/2014 Imaging   No CT findings for recurrent or metastatic disease involving the abdomen/ pelvis    06/13/2014 Relapse/Recurrence   + recurrence paraspinous muscle    06/21/2014 Procedure   There is a soft tissue lesion along the left side of L3-L4. This lesion roughly measures up to 2.2 cm. Needle was positioned along the posterior aspect of the lesion. Small amount of air in the paraspinal tissues following needle removal    06/21/2014 Pathology Results   Bone, biopsy, left lumbar  paraspinal - METASTATIC POORLY DIFFERENTIATED CARCINOMA, CONSISTENT WITH HIGH GRADE SEROUS CARCINOMA SEE COMMENT. Microscopic Comment The carcinoma demonstrates the following immunophenotype: Cytokeratin 7 - patchy moderate to strong expression Estrogen receptor - negative expression P53 - strong diffuse expression TTF-1 - negative expression WT-1 - negative expression CD56 - focal moderate strong expression Synaptophysin - negative expression GCDFP - negative expression The history of primary endometrial papillary serous carcinoma and primary mammary carcinoma is noted. In the current case, the overall morphologic and immunophenotype are that of poorly differentiated carcinoma, consistent with high grade serous carcinoma.     07/11/2014 - 08/12/2014 Radiation Therapy   07/11/14-08/12/14: 55 Gy in 25 fractions to the Lumbar spine    08/15/2014 Imaging   CT scan of chest, abdomen and pelvis 1. Since the biopsy study of 06/21/2014, similar to slight decrease in size of a left paraspinous lesion at L3-4. 2. No new sites of metastatic disease identified. 3. 8 mm ground-glass nodule in the right lower lobe is grossly similar to 03/10/2013, suggesting a benign etiology. 4. Cholelithiasis. 5. Apparent sigmoid colonic wall thickening which could be due to underdistention. Colitis felt less likely. 6.  Atherosclerosis, including within the coronary arteries. 7. Pelvic floor laxity.    10/07/2014 Imaging   CT scan of chest, abdomen and pelvis: Stable to slight interval decrease in size of left paraspinous lesion at L3-4. No new sites of metastatic disease. Unchanged ground-glass nodule within the right lower lobe, potentially benign in etiology. Recommend attention on followup. Cholelithiasis. Sigmoid colonic diverticulosis. No CT evidence to suggest acute diverticulitis.    01/19/2015 Imaging   Ct scan of abdomen and pelvis 1. Decrease in size of left paraspinous metastasis. 2. No new sites of  disease. 3. Stable ground-glass attenuating nodule within the superior segment of right lower lobe. 4. Gallstones    05/02/2015 Imaging   Ct abdomen and pelvis: Slight interval increase in size of left lumbar paraspinal soft tissue metastasis. No new sites of metastatic disease identified within the abdomen or pelvis.      05/11/2015 PET scan   1. The small soft tissue mass just lateral to the left L3 for neural foramen is hypermetabolic, favoring malignancy. 2. There is also a small focus of hypermetabolic activity between the left L1 and L2 transverse processes, but without a CT correlate. 3. The 13 mm sub solid nodule in the superior segment right lower lobe is stable from recent exams but has slowly increased in size over the last 7 years. Although not hypermetabolic, the appearance is concerning for the possibility of a low grade adenocarcinoma. 4. Other imaging findings of potential clinical significance: Chronic bilateral maxillary sinusitis. Coronary, aortic arch, and branch vessel atherosclerotic vascular disease. Aortoiliac atherosclerotic vascular disease. Cholelithiasis. Sigmoid colon diverticulosis. Lumbar scoliosis.    07/11/2015 Imaging   Ct scan of chest, abdomen and pelvis: 1. 13 mm mixed solid and sub solid nodule in the posterior right lower  lobe was present in 2009 and has clearly progressed in the interval since that study. Imaging features remain highly concerning for low-grade or well differentiated adenocarcinoma. 2. Slight increase size of in the hypermetabolic, rim enhancing nodule identified adjacent to the left L3-4 neural foramen. Metastatic disease remains a concern. 3. No evidence for discrete soft tissue lesion between the left L1 and 2 transverse processes, the site of focal FDG uptake on the recent PET-CT. 4. Cholelithiasis. 5. Abdominal aortic atherosclerosis    08/03/2015 - 01/16/2016 Chemotherapy   She received carboplatin and Taxol   11/06/2015 PET scan    1. Hypermetabolic left V5-6 paraspinous metastasis (biopsy-proven) is stable in size and slightly decreased in metabolism. 2. Previously described small focus of hypermetabolism between the left L2 and L3 spinous processes has resolved. 3. New mild linear hypermetabolism to the left of the T11-12 spinous processes without discrete mass on the CT images, favor benign activity related activity, recommend attention on follow-up PET-CT.  4. No definite new sites of hypermetabolic metastatic disease. No recurrent hypermetabolic metastatic disease in the pelvis. 5. Interval stability of subsolid 1.3 cm superior segment right lower lobe pulmonary nodule without associated significant metabolism, which has grown compared to the 2009 chest CT study, and remain suspicious for low grade adenocarcinoma. 6. Additional findings include aortic atherosclerosis, coronary atherosclerosis, mild multinodular goiter with no hypermetabolic thyroid nodules, cholelithiasis and moderate sigmoid diverticulosis.    02/12/2016 PET scan   1. Interval increase in size and metabolic activity of the LEFT paraspinal soft tissue metastasis. 2. New activity within the musculature of the LEFT chest wall. Given the unusual location of the paraspinal metastasis cannot exclude a second soft tissue metastasis to the musculature however favor benign physiologic activity. 3. Stable RIGHT lower lobe pulmonary nodule. 4. No evidence of local recurrence.        04/08/2016 Imaging   Ct scan of chest, abdomen and pelvis: 1. Similar size of a  left paravertebral abdominal lesion. 2. No new or progressive metastatic disease. 3. No correlate for the muscular activity about the lateral left chest wall. 4.  Coronary artery atherosclerosis. Aortic atherosclerosis. 5. Cholelithiasis. 6. Similar right lower lobe pulmonary nodule.    06/03/2016 Imaging   CT abdomen and pelvis 1. Similar to mild enlargement of a paravertebral soft tissue  lesion at the L3-4 level. 2. No new sites of disease identified. 3. Cholelithiasis. 4. Hysterectomy. 5.  Aortic atherosclerosis.      09/02/2016 Imaging   CT abdomen and pelvis 1. Continue mild interval increase in size of LEFT paraspinal mass (1-2 mm). 2. No evidence of new metastatic disease in the abdomen pelvis. 3. Post hysterectomy anatomy    09/26/2016 Imaging   MR lumbar spine 1. Left paraspinal metastasis with epicenter adjacent to the left L3 neural foramen does extend into the foramen to the level of the left lateral epidural space (series 9, image 31), but also tracks cephalad and caudal along the L2-L4 lumbar plexus (series 10, image 15). No extension into the left L2 or L4 neural foramina. And no more distal extension of tumor. 2. Abnormal signal in the medial left psoas and ventral left erector spinae muscles at L2 and L3 is probably denervation related. 3. No bone invasion or osseous metastatic disease. Suspect previous radiation of the L2 through L5 spinal levels. 4. No dural or intradural metastatic disease.      10/22/2016 Procedure   She underwent stereotactic Radiosurgery    10/30/2016 Genetic Testing   Patient  has genetic testing done for Inheritable genetic mutation panel. Results revealed patient has no actionable mutation.    01/21/2017 Imaging   1. Reduced size and conspicuity of the left paraspinal mass at the L3-4 level. The mass still present but has enhancement similar to that of adjacent psoas musculature. 2. No new metastatic disease is identified. 3. Other imaging findings of potential clinical significance: Cholelithiasis. Aortic Atherosclerosis (ICD10-I70.0). Sigmoid diverticulosis. Lumbar scoliosis.    05/09/2017 Imaging   Ct scan of abdomen and pelvis 1. Paraspinal mass adjacent to the left side of L3-L4 is less distinct than prior examinations, and is therefore difficult to discretely measure, however, the overall appearance suggests continued  positive response to therapy. 2. No new signs of metastatic disease elsewhere in the abdomen or pelvis. 3. Aortic atherosclerosis, in addition to at least right coronary artery disease. Assessment for potential risk factor modification, dietary therapy or pharmacologic therapy may be warranted, if clinically indicated. 4. Colonic diverticulosis without evidence of acute diverticulitis at this time. 5. Additional incidental findings, as above. Aortic Atherosclerosis (ICD10-I70.0).    08/14/2017 Imaging   1. Treated left paraspinal mass centered at L3-4. Mass size is stable but there has been some evolution of tumor characteristics with less central fluid seen today. Recommend continued surveillance. 2. No evidence of untreated metastasis. 3. Degenerative changes and related impingement are described above.    10/28/2017 Imaging   Stable small left paraspinal soft tissue mass. No new or progressive disease within the abdomen or pelvis.   Cholelithiasis.  No radiographic evidence of cholecystitis.   Colonic diverticulosis, without radiographic evidence of diverticulitis.    04/30/2018 Imaging   Stable post treatment change of the partially necrotic LEFT paravertebral mass at L3-L4. 18 x 21 mm cross-section. Mass effect on the LEFT L3 nerve root redemonstrated. Enhancing and atrophic LEFT psoas is inseparable.   10/15/2018 Imaging   1. Stable post treatment size and appearance of partially necrotic left paravertebral mass at L3-4, measuring 19 x 25 mm on current exam (unchanged when measured at similar level on previous study). Mass effect on the adjacent left L3 nerve root is unchanged. Adjacent enhancing and atrophic left psoas muscle. 2. Left convex scoliosis with associated multilevel degenerative spondylolysis and facet arthrosis, unchanged   10/29/2018 Imaging   Stable small left L3 paraspinal soft tissue mass. No evidence of new or progressive metastatic disease, or other acute findings.    Cholelithiasis.  No radiographic evidence of cholecystitis.   Colonic diverticulosis, without radiographic evidence of diverticulitis.   11/11/2018 PET scan   1. Low level metabolism (max SUV 2.0) associated with the irregular subsolid superior segment right lower lobe 2.1 cm pulmonary nodule. As better depicted on the recent diagnostic chest CT study, this nodule crosses the major fissure into the right upper lobe and has increased in size and density on multiple imaging studies back to 2009. Findings are most suggestive of primary bronchogenic adenocarcinoma. 2. No hypermetabolic thoracic adenopathy. 3. Persistent hypermetabolism (max SUV 8.0) associated with the known left L3-4 paraspinous metastasis, mildly decreased in metabolism since 02/12/2016 PET-CT. 4. No additional sites of hypermetabolic metastatic disease in the abdomen or pelvis. No metabolic evidence of peritoneal recurrence. No ascites. 5. Chronic findings include: Aortic Atherosclerosis (ICD10-I70.0). Coronary atherosclerosis. Chronic bilateral maxillary sinusitis. Cholelithiasis. Moderate sigmoid diverticulosis.   01/07/2019 - 04/04/2019 Chemotherapy   The patient had carboplatin and Alimta for chemotherapy treatment.     05/03/2019 -  Chemotherapy   The patient had Pembrolizumab and Lenvima  for chemotherapy treatment.     07/23/2019 PET scan   1. 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 No new sites of hypermetabolic metastatic disease on today's study. 2. Stable appearance of surgical changes in the right lung without features to suggest local recurrence or metastatic lung cancer. 3. Cholelithiasis. 4.  Aortic Atherosclerois (ICD10-170.0)   10/20/2019 Imaging   Bilateral venous Doppler US RIGHT:  - Findings consistent with acute and occlusive deep vein thrombosis involving the right common femoral vein, right external iliac vein, right femoral vein, right popliteal  vein, right posterior tibial veins, and right peroneal veins.   Unable to adequately visualize the common iliac veins due to bowel gas. The imaged portions of the IVC were patent.     LEFT:  - No evidence of common femoral vein obstruction.    11/08/2019 PET scan   1. Further reduction in activity at the left L3 level and left paraspinal tissues at L3. Maximum vertebral SUV currently 3.9, previously 4.8 on 07/23/2019 and previously 10.0 on 04/14/2019. 2. Late phase healing of previous left transverse process fractures at L3 and L4. Suspected healing right sixth rib fracture anteriorly. 3. There is new focal activity in the vicinity of the right internal jugular vein in the lower neck. Maximum SUV in this vicinity is 5.1. I not see a well-defined lymph node are thyroid lesion to corroborate with the focus, but surveillance in this region is suggested. 4. Other imaging findings of potential clinical significance: Aortic Atherosclerosis (ICD10-I70.0). Coronary atherosclerosis. Cholelithiasis. Sigmoid colon diverticulosis.   08/16/2020 PET scan   Signs of nodal recurrence in the LEFT pelvis as described.   Increased metabolic activity at L3 without discrete visible lesion, subtle sclerosis in this area seen in the context of degenerative change. But seen also at the site of previous disease suspicious for disease recurrence and with metabolic activity considerably increased compared to previous imaging.   Signs of partial lung resection in the RIGHT chest as before.   Muscular activity in the bilateral abductor compartment of the upper thigh RIGHT greater than LEFT felt to be physiologic. Correlate with any symptoms in this location.   Aortic atherosclerosis.   08/28/2020 -  Chemotherapy    Patient is on Treatment Plan: UTERINE LENVATINIB + PEMBROLIZUMAB Q21D       11/16/2020 PET scan   Resolution of activity associated with LEFT pelvic lymph nodes compared to previous imaging but with  worsening of FDG uptake at the site of previous disease in the L3 vertebral body. No discernible lesion aside from subtle sclerosis at this location. No visible paraspinal soft tissue.   New activity in the subjacent L4 vertebral body, new site of disease versus worsening of adjacent degenerative change.   MRI could be considered for further evaluation of above findings as warranted for direct comparison with previous MR imaging.   Focal increased metabolic uptake about the RIGHT shoulder without visible lesion on CT is of uncertain significance, attention on follow-up.   New area of nodularity about the RIGHT mid chest along the anterior pleural surface without associated increased metabolic activity. Suggest consideration for short interval follow-up.   Cholelithiasis and aortic atherosclerosis.   Aortic Atherosclerosis (ICD10-I70.0).   11/27/2020 Imaging   1. Persistent T1 hypointense, enhancing lesion on the left side of the L3 vertebral body with new enhancement in the superior endplate of the L4 vertebral body on the left, corresponding to areas of increased FDG uptake on  recent FDG PET, concerning for metastatic disease. 2. Left paraspinal soft tissue lesion at the L3 level is unchanged. 3. Moderate spinal canal stenosis and severe left neural foraminal narrowing at L3-4 and L4-5.   Metastatic cancer to bone ([place])  06/27/2014 Initial Diagnosis   Metastatic cancer to bone    Primary lung cancer, right ([place])  11/04/2018 Initial Diagnosis   Primary lung cancer, right ([place])   11/11/2018 PET scan   1. Low level metabolism (max SUV 2.0) associated with the irregular subsolid superior segment right lower lobe 2.1 cm pulmonary nodule. As better depicted on the recent diagnostic chest CT study, this nodule crosses the major fissure into the right upper lobe and has increased in size and density on multiple imaging studies back to 2009. Findings are most suggestive of primary bronchogenic  adenocarcinoma. 2. No hypermetabolic thoracic adenopathy. 3. Persistent hypermetabolism (max SUV 8.0) associated with the known left L3-4 paraspinous metastasis, mildly decreased in metabolism since 02/12/2016 PET-CT. 4. No additional sites of hypermetabolic metastatic disease in the abdomen or pelvis. No metabolic evidence of peritoneal recurrence. No ascites. 5. Chronic findings include: Aortic Atherosclerosis (ICD10-I70.0). Coronary atherosclerosis. Chronic bilateral maxillary sinusitis. Cholelithiasis. Moderate sigmoid diverticulosis.   12/07/2018 Pathology Results   1. Lung, resection (segmental or lobe), Right Lobe Superior with endblock wedge of right upper lobe - INVASIVE ADENOCARCINOMA, MODERATELY DIFFERENTIATED, SPANNING 1.6 CM. - THE SURGICAL RESECTION MARGINS ARE NEGATIVE FOR CARCINOMA. - SEE ONCOLOGY TABLE BELOW. 2. Lymph node, biopsy, Level 9 #1 - THERE IS NO EVIDENCE OF CARCINOMA IN 1 OF 1 LYMPH NODE (0/1). 3. Lymph node, biopsy, Level 9 #2 - THERE IS NO EVIDENCE OF CARCINOMA IN 1 OF 1 LYMPH NODE (0/1). 4. Lymph node, biopsy, Level 7 #1 - METASTATIC CARCINOMA IN 1 OF 1 LYMPH NODE (1/1). 5. Lymph node, biopsy, Level 7 #2 - THERE IS NO EVIDENCE OF CARCINOMA IN 1 OF 1 LYMPH NODE (0/1). 6. Lymph node, biopsy, Level 12 #1 - THERE IS NO EVIDENCE OF CARCINOMA IN 1 OF 1 LYMPH NODE (0/1). 7. Lymph node, biopsy, Level 12 #2 - THERE IS NO EVIDENCE OF CARCINOMA IN 1 OF 1 LYMPH NODE (0/1). 8. Lymph node, biopsy, Level 10 #1 - THERE IS NO EVIDENCE OF CARCINOMA IN 1 OF 1 LYMPH NODE (0/1). 9. Lymph node, biopsy, Level 4R #1 - THERE IS NO EVIDENCE OF CARCINOMA IN 1 OF 1 LYMPH NODE (0/1). 10. Lymph node, biopsy, Level 4R #2 - THERE IS NO EVIDENCE OF CARCINOMA IN 1 OF 1 LYMPH NODE (0/1). 11. Lymph node, biopsy, Level 2R #1 - THERE IS NO EVIDENCE OF CARCINOMA IN 1 OF 1 LYMPH NODE (0/1). 12. Lung, resection (segmental or lobe), Right Lobe Superior - BENIGN LUNG PARENCHYMA. - THERE IS NO  EVIDENCE OF MALIGNANCY. Procedure: Segmentectomy. Specimen Laterality: Right. Tumor Site: Right superior lobe. Tumor Size: 1.6 cm (gross measurement). Tumor Focality: Unifocal. Histologic Type: Adenocarcinoma, moderately differentiated. Visceral Pleura Invasion: Not identified. Lymphovascular Invasion: Not identified. Direct Invasion of Adjacent Structures: Not identified. Margins: Negative for carcinoma. Treatment Effect: N/A Regional Lymph Nodes: Number of Lymph Nodes Involved: 1 (level 7) Number of Lymph Nodes Examined: 10 Pathologic Stage Classification (pTNM, AJCC 8th Edition): pT1b, pN2 Ancillary Studies: The tumor cells are positive for Napsin-A, TTF-1, and p53. They are essentially negative for estrogen receptor, PAX-8 and progesterone receptor. Additional studies can be performed upon clinician request. Representative Tumor Block: 1D-1E. (JBK:gt, 12/09/18)   12/07/2018 Surgery   PREOPERATIVE DIAGNOSIS:  Right lung nodule.  POSTOPERATIVE DIAGNOSIS:  Non-small cell carcinoma- suspected primary lung carcinoma, clinical stage T2N01b.   PROCEDURE:   Right video-assisted thoracoscopy, Right lower lobe superior segmentectomy with en bloc wedge resection of right upper lobe, Lymph node dissection, Intercostal nerve block levels 3-9.   SURGEON:  Modesto Charon, MD   FINDINGS:  Mass palpable in posterior aspect of superior segment of right lower lobe, likely involvement across fissure into the upper lobe.  Frozen section revealed non-small cell carcinoma. upper and lower lobe margins clear.   12/23/2018 Cancer Staging   Staging form: Lung, AJCC 8th Edition - Pathologic: Stage IIIA (pT1b, pN2, cM0) - Signed by Heath Lark, MD on 12/23/2018    01/05/2019 Imaging   MRI brain 1. No metastatic disease or acute intracranial abnormality identified. 2. Small chronic parafalcine meningioma size is stable since that described in 2006 (12 x 15 mm). 3. Advanced signal changes in the  cerebral white matter and to a lesser extent pons, nonspecific but most commonly due to chronic small vessel disease. 4. Partially visible degenerative cervical spinal stenosis.     01/07/2019 -  Chemotherapy   The patient had carboplatin and Alimta for chemotherapy treatment.     04/14/2019 PET scan   1. 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 As shown on recent MRI of 04/01/2019, there is a new component of invasion into the left lower L3 vertebral body. This new component has a maximum SUV of 10.0, compatible with active malignancy. 2. Interval wedge resection of portions of the right upper lobe and right lower lobe at the site of the prior nodule. There is only minimal low-grade activity along the wedge resection clips, maximum SUV of 1.5. Surveillance is likely warranted. No new nodule identified. 3. Other imaging findings of potential clinical significance: Aortic Atherosclerosis (ICD10-I70.0). Coronary atherosclerosis. Thoracolumbar scoliosis. Cholelithiasis. Sigmoid colon diverticulosis.     05/03/2019 -  Chemotherapy   The patient had Pembrolizumab and Lenvima for chemotherapy treatment.     05/22/2019 Imaging   Ct head Atrophy, chronic microvascular disease.   No acute intracranial abnormality.     03/09/2020 PET scan   1. No evidence of malignant disease progression. 2. Similar mild metabolic activity within at the sclerotic L3 vertebral body lesion. Reduction in paraspinal muscular metabolic activity at L3 vertebral body level.     REVIEW OF SYSTEMS:   Constitutional: Denies fevers, chills or abnormal weight loss Eyes: Denies blurriness of vision Ears, nose, mouth, throat, and face: Denies mucositis or sore throat Respiratory: Denies cough, dyspnea or wheezes Cardiovascular: Denies palpitation, chest discomfort or lower extremity swelling Gastrointestinal:  Denies nausea, heartburn or change in bowel habits Skin: Denies abnormal skin  rashes Lymphatics: Denies new lymphadenopathy or easy bruising Neurological:Denies numbness, tingling or new weaknesses Behavioral/Psych: Mood is stable, no new changes  All other systems were reviewed with the patient and are negative.  I have reviewed the past medical history, past surgical history, social history and family history with the patient and they are unchanged from previous note.  ALLERGIES:  is allergic to ace inhibitors, dilaudid [hydromorphone hcl], and codeine.  MEDICATIONS:  Current Outpatient Medications  Medication Sig Dispense Refill   atenolol (TENORMIN) 25 MG tablet Take 25 mg by mouth 2 (two) times daily.     BIOTIN PO Take 500 mcg by mouth daily.     calcium carbonate (TUMS - DOSED IN MG ELEMENTAL CALCIUM) 500 MG chewable tablet Chew 1 tablet by mouth daily. 1200 mg daily  cetirizine (ZYRTEC) 10 MG tablet Take 10 mg by mouth daily.     cholecalciferol (VITAMIN D3) 25 MCG (1000 UNIT) tablet Take 10,000 Units by mouth daily.     Cyanocobalamin (VITAMIN B 12) 500 MCG TABS Take 1 tablet by mouth daily at 2 PM.     donepezil (ARICEPT) 5 MG tablet TAKE 1 TABLET BY MOUTH EVERYDAY AT BEDTIME 90 tablet 1   furosemide (LASIX) 20 MG tablet Take 1 tablet (20 mg total) by mouth daily. (Patient taking differently: Take 20 mg by mouth daily. Take 2 tabs on a Tuesday/Thursday and Saturday/ 1 tablet on the other days.) 10 tablet 0   hydrALAZINE (APRESOLINE) 25 MG tablet Take 25 mg by mouth daily. One tablet Mid afternoon     hydrALAZINE (APRESOLINE) 50 MG tablet Take 50 mg by mouth 2 (two) times daily.     lenvatinib 10 mg daily dose (LENVIMA) capsule Take 1 capsule (10 mg total) by mouth daily. (Patient taking differently: Take 10 mg by mouth every other day. Takes 1 tablet every other day on even calendar days of month) 30 capsule 11   memantine (NAMENDA) 5 MG tablet TAKE 2 TABLETS BY MOUTH TWICE A DAY 360 tablet 1   Multiple Vitamin (MULTIVITAMIN WITH MINERALS) TABS tablet  Take 1 tablet by mouth daily.     Potassium 99 MG TABS Take 1 tablet by mouth daily.     rivaroxaban (XARELTO) 10 MG TABS tablet Take 1 tablet (10 mg total) by mouth daily. 30 tablet 11   simvastatin (ZOCOR) 20 MG tablet Take 20 mg by mouth every evening.      levothyroxine (SYNTHROID) 50 MCG tablet Take 1 tablet (50 mcg total) by mouth daily before breakfast. 30 tablet 3   oxyCODONE (OXY IR/ROXICODONE) 5 MG immediate release tablet Take 1 tablet (5 mg total) by mouth every 6 (six) hours as needed for severe pain. 60 tablet 0   No current facility-administered medications for this visit.   Facility-Administered Medications Ordered in Other Visits  Medication Dose Route Frequency Provider Last Rate Last Admin   heparin lock flush 100 unit/mL  500 Units Intracatheter Once Alvy Bimler, , MD       sodium chloride flush (NS) 0.9 % injection 10 mL  10 mL Intracatheter Once Alvy Bimler, , MD        PHYSICAL EXAMINATION: ECOG PERFORMANCE STATUS: 1 - Symptomatic but completely ambulatory  Vitals:   12/12/20 1300  BP: (!) 175/78  Pulse: 64  Resp: 17  Temp: 98.6 F (37 C)  SpO2: 98%   Filed Weights   12/12/20 1300  Weight: 117 lb 9.6 oz (53.3 kg)    GENERAL:alert, no distress and comfortable SKIN: skin color, texture, turgor are normal, no rashes or significant lesions EYES: normal, Conjunctiva are pink and non-injected, sclera clear OROPHARYNX:no exudate, no erythema and lips, buccal mucosa, and tongue normal  NECK: supple, thyroid normal size, non-tender, without nodularity LYMPH:  no palpable lymphadenopathy in the cervical, axillary or inguinal LUNGS: clear to auscultation and percussion with normal breathing effort HEART: regular rate & rhythm and no murmurs and no lower extremity edema ABDOMEN:abdomen soft, non-tender and normal bowel sounds Musculoskeletal:no cyanosis of digits and no clubbing  NEURO: alert & oriented x 3 with fluent speech, no focal motor/sensory  deficits  LABORATORY DATA:  I have reviewed the data as listed    Component Value Date/Time   NA 142 12/12/2020 1221   NA 140 05/09/2017 0844   K 3.7 12/12/2020  1221   K 4.1 05/09/2017 0844   CL 100 12/12/2020 1221   CO2 28 12/12/2020 1221   CO2 26 05/09/2017 0844   GLUCOSE 108 (H) 12/12/2020 1221   GLUCOSE 82 05/09/2017 0844   BUN 18 12/12/2020 1221   BUN 20.7 05/09/2017 0844   CREATININE 1.05 (H) 12/12/2020 1221   CREATININE 0.7 05/09/2017 0844   CALCIUM 10.2 12/12/2020 1221   CALCIUM 9.3 05/09/2017 0844   PROT 7.3 12/12/2020 1221   PROT 6.8 05/09/2017 0844   ALBUMIN 3.5 12/12/2020 1221   ALBUMIN 3.6 05/09/2017 0844   AST 37 12/12/2020 1221   AST 27 05/09/2017 0844   ALT 21 12/12/2020 1221   ALT 17 05/09/2017 0844   ALKPHOS 58 12/12/2020 1221   ALKPHOS 43 05/09/2017 0844   BILITOT 0.9 12/12/2020 1221   BILITOT 0.81 05/09/2017 0844   GFRNONAA 54 (L) 12/12/2020 1221   GFRAA >60 02/07/2020 1109    No results found for: SPEP, UPEP  Lab Results  Component Value Date   WBC 8.2 12/12/2020   NEUTROABS 6.0 12/12/2020   HGB 12.9 12/12/2020   HCT 38.3 12/12/2020   MCV 99.0 12/12/2020   PLT 190 12/12/2020      Chemistry      Component Value Date/Time   NA 142 12/12/2020 1221   NA 140 05/09/2017 0844   K 3.7 12/12/2020 1221   K 4.1 05/09/2017 0844   CL 100 12/12/2020 1221   CO2 28 12/12/2020 1221   CO2 26 05/09/2017 0844   BUN 18 12/12/2020 1221   BUN 20.7 05/09/2017 0844   CREATININE 1.05 (H) 12/12/2020 1221   CREATININE 0.7 05/09/2017 0844      Component Value Date/Time   CALCIUM 10.2 12/12/2020 1221   CALCIUM 9.3 05/09/2017 0844   ALKPHOS 58 12/12/2020 1221   ALKPHOS 43 05/09/2017 0844   AST 37 12/12/2020 1221   AST 27 05/09/2017 0844   ALT 21 12/12/2020 1221   ALT 17 05/09/2017 0844   BILITOT 0.9 12/12/2020 1221   BILITOT 0.81 05/09/2017 0844       RADIOGRAPHIC STUDIES: I have personally reviewed the radiological images as listed and agreed  with the findings in the report. MR LUMBAR SPINE W WO CONTRAST  Result Date: 11/27/2020 CLINICAL DATA:  Endometrial cancer.  Metastatic cancer to the bone. EXAM: MRI LUMBAR SPINE WITHOUT AND WITH CONTRAST TECHNIQUE: Multiplanar and multiecho pulse sequences of the lumbar spine were obtained without and with intravenous contrast. CONTRAST:  21m GADAVIST GADOBUTROL 1 MMOL/ML IV SOLN COMPARISON:  MRI of the lumbar spine July 15, 2019. FDG PET November 15, 2020. FINDINGS: Segmentation:  Standard. Alignment: Stable 6 mm anterolisthesis of L4 over L5. "S" shaped thoracolumbar scoliosis. Vertebrae: T1 hypointense, enhancing lesion is seen involving the left side of the L3 vertebral body extending into the corresponding posterior elements, concerning for malignancy, appear similar to prior MRI. However, new T1 hypointense lesion is seen in the superior aspect of the L4 vertebral body on the left extending to the correspond helical, also concerning for malignancy.Edema and contrast enhancement in the T12 and L1 opposing endplates on the right appear degenerative. Diffuse decreased T1 signal in the remainder of the lumbar spine is related to prior radiotherapy. Conus medullaris and cauda equina: Conus extends to the L1-2 level. Conus and cauda equina appear normal. Paraspinal and other soft tissues: No significant interval change of the paraspinal mass to the left of the L3 vertebral body extending into the corresponding neural  foramen measuring approximately 2.0 x 1.5 cm (series 11, image 29). Disc levels: T10-11: No spinal canal or neural foraminal stenosis. T11 12: No spinal canal or neural foraminal stenosis. T12-L1: Right foraminal disc osteophyte complex and mild facet degenerative changes resulting in mild right neural foraminal narrowing. No spinal canal stenosis. L1-2: Loss of disc height, disc bulge with a right foraminal/far lateral disc osteophyte complex and facet degenerative changes resulting in narrowing of the  right subarticular zone and moderate right neural foraminal narrowing. L2-3: Disc bulge with a right foraminal/far lateral disc osteophyte complex and facet degenerative changes resulting in mild right neural foraminal narrowing. No spinal canal stenosis. L3-4 disc bulge, facet degenerative changes and right foraminal mass lesion, as described above resulting in moderate spinal canal stenosis with effacement of the left subarticular zone and severe left neural foraminal narrowing. L4-5: Disc bulge/disc uncovering, prominent hypertrophic facet degenerative changes and ligamentum flavum redundancy resulting in moderate spinal canal stenosis with narrowing of the bilateral subarticular zones, left greater than right, and severe left neural foraminal narrowing. L5-S1: Shallow disc bulge and moderate facet degenerative changes. No significant spinal canal or neural foraminal stenosis. IMPRESSION: 1. Persistent T1 hypointense, enhancing lesion on the left side of the L3 vertebral body with new enhancement in the superior endplate of the L4 vertebral body on the left, corresponding to areas of increased FDG uptake on recent FDG PET, concerning for metastatic disease. 2. Left paraspinal soft tissue lesion at the L3 level is unchanged. 3. Moderate spinal canal stenosis and severe left neural foraminal narrowing at L3-4 and L4-5. Electronically Signed   By: Pedro Earls M.D.   On: 11/27/2020 14:03   NM PET Image Restage (PS) Skull Base to Thigh  Result Date: 11/16/2020 CLINICAL DATA:  Subsequent treatment strategy for non-small cell lung cancer, metastatic in an 80 year old female. EXAM: NUCLEAR MEDICINE PET SKULL BASE TO THIGH TECHNIQUE: 6.73 mCi F-18 FDG was injected intravenously. Full-ring PET imaging was performed from the skull base to thigh after the radiotracer. CT data was obtained and used for attenuation correction and anatomic localization. Fasting blood glucose: 92 mg/dl COMPARISON:  Multiple  prior studies, most recent of April of 2022. FINDINGS: Mediastinal blood pool activity: SUV max 1.82 Liver activity: SUV max NA NECK: No hypermetabolic lymph nodes in the neck. Incidental CT findings: none CHEST: No hypermetabolic mediastinal or hilar nodes. No suspicious pulmonary nodules on the CT scan. Incidental CT findings: Calcified atheromatous plaque in the thoracic aorta. Normal heart size without pericardial effusion. Signs of three-vessel coronary artery disease. Normal caliber of central pulmonary vasculature. LEFT-sided Port-A-Cath terminates at the midportion of the superior vena cava as before. Signs of RIGHT axillary dissection similar to prior imaging. Evidence of partial lung resection in the RIGHT chest without change. No sign of consolidation or evidence of pleural effusion. New area of nodularity measuring 4 mm along the pleural surface in the RIGHT mid chest (image 38/8) this is not associated with increased metabolic activity. ABDOMEN/PELVIS: The LEFT common iliac lymph nodes seen on the previous study has resolved nearly completely faintly visualize soft tissue on image 121 of series 4 measuring approximately 3 mm is all that remains. No signs of FDG uptake above blood pool in this location Additional small lymph node also with resolution of FDG uptake (image 128/4) maximum SUV of approximately 1.3 as compared to 5.6 on the prior study measuring 3 mm as compared to 7 mm short axis. No signs of solid organ disease or  additional evidence of nodal disease. Incidental CT findings: Cholelithiasis. No acute findings relative to liver, gallbladder, spleen, pancreas, adrenal glands or kidneys. Urinary bladder is decompressed without perivesical stranding. Signs of colonic diverticulosis. No acute gastrointestinal process. Aortic atherosclerosis with calcification. No aneurysmal dilation. Smooth contour of the IVC. No pelvic lymphadenopathy. Post hysterectomy. SKELETON: Increased metabolic activity  about the L3 vertebral body, area increased since previous imaging with a maximum SUV of 14.1 as compared to 7.9 with continued worsening over the last 2 exams. This area is larger on FDG portion of the exam measuring up to 2.3 cm with respect to the area of the activity previously less than a cm. Subtle sclerosis is noted in this area without frank destruction. There is associated degenerative change. Small area in the L4 with increased metabolic activity is now apparent along the superior endplate. No additional hypermetabolic bony abnormalities. Mild increased metabolic activity about the RIGHT shoulder without corresponding visible lesion is favored to represent degenerative related changes. Incidental CT findings: Marked spinal degenerative changes and levoconvex rotary curvature in the lumbar spine. IMPRESSION: Resolution of activity associated with LEFT pelvic lymph nodes compared to previous imaging but with worsening of FDG uptake at the site of previous disease in the L3 vertebral body. No discernible lesion aside from subtle sclerosis at this location. No visible paraspinal soft tissue. New activity in the subjacent L4 vertebral body, new site of disease versus worsening of adjacent degenerative change. MRI could be considered for further evaluation of above findings as warranted for direct comparison with previous MR imaging. Focal increased metabolic uptake about the RIGHT shoulder without visible lesion on CT is of uncertain significance, attention on follow-up. New area of nodularity about the RIGHT mid chest along the anterior pleural surface without associated increased metabolic activity. Suggest consideration for short interval follow-up. Cholelithiasis and aortic atherosclerosis. Aortic Atherosclerosis (ICD10-I70.0). Electronically Signed   By: Zetta Bills M.D.   On: 11/16/2020 12:01

## 2020-12-13 NOTE — Assessment & Plan Note (Signed)
She has no recent dental issues She is on calcium and vitamin D and is due for Zometa

## 2020-12-13 NOTE — Assessment & Plan Note (Signed)
Her blood pressure fluctuated up and down Her pain is difficult to assess because according to the patient, her pain is not too bad but according to her husband, her pain is pretty bad We will proceed with treatment today She will continue aggressive blood pressure monitoring and management with her primary care doctor

## 2020-12-13 NOTE — Assessment & Plan Note (Signed)
She is taking oxycodone on a regular basis She denies constipation She will continue current prescribed dose of pain medicine

## 2020-12-13 NOTE — Assessment & Plan Note (Signed)
I have reviewed her documented blood pressure Her blood pressure fluctuated widely with numbers as low as 102/63 to as high as 194/107 She will continue aggressive blood pressure management through her primary care doctor

## 2020-12-25 ENCOUNTER — Ambulatory Visit: Payer: Medicare Other | Admitting: Allergy

## 2020-12-25 NOTE — Progress Notes (Deleted)
Follow Up Note  RE: [name] MRN: [id number] DOB: 09-Feb-1941 Date of Office Visit: 12/25/2020  Referring provider: Shon Baton, MD Primary care provider: Shon Baton, MD  Chief Complaint: No chief complaint on file.  History of Present Illness: I had the pleasure of seeing [name] for a follow up visit at the [facility] on 12/25/2020. She is a 80 y.o. female, who is being followed for pruritic rash. Her previous allergy office visit was on 09/28/2020 with Dr. Maudie Sandoval. Today is a regular follow up visit.  Did she get blood work drawn?  Rash and other nonspecific skin eruption 8 month history of persistent rash on extremities. Denies changes in diet, meds, personal are products. Tried steroids, topical triamcinolone with no benefit. Evaluated by dermatology but no prior skin biopsy. Complicated medical history with history of multiple cancers, DVT, HTN, memory issues. Discussed with patient and spouse at length that current rash is not typical of hives and does not seem to be "allergic" in nature.  Get bloodwork to rule out other etiologies.  Take hydroxyzine 10mg  1 hour before bedtime as needed for the itching. Use mometasone 0.1% ointment twice a day as needed for the rash Do not use on the face, neck, armpits or groin area. Do not use more than 3 weeks in a row.  See below for proper skin care - moisturize daily.  If no improvement, then recommend re-evaluation by dermatology for possible skin biopsy.    Pruritus See assessment and plan as above.   Essential hypertension Elevated reading in office today. Follow up with PCP regarding this.    Return in about 2 months (around 11/28/2020).  Assessment and Plan: [name] is a 80 y.o. female with: No problem-specific Assessment & Plan notes found for this encounter.  No follow-ups on file.  No orders of the defined types were placed in this encounter.  Lab Orders  No laboratory test(s) ordered today     Diagnostics: Spirometry:  Tracings reviewed. Her effort: {Blank single:19197::"Good reproducible efforts.","It was hard to get consistent efforts and there is a question as to whether this reflects a maximal maneuver.","Poor effort, data can not be interpreted."} FVC: ***L FEV1: ***L, ***% predicted FEV1/FVC ratio: ***% Interpretation: {Blank single:19197::"Spirometry consistent with mild obstructive disease","Spirometry consistent with moderate obstructive disease","Spirometry consistent with severe obstructive disease","Spirometry consistent with possible restrictive disease","Spirometry consistent with mixed obstructive and restrictive disease","Spirometry uninterpretable due to technique","Spirometry consistent with normal pattern","No overt abnormalities noted given today's efforts"}.  Please see scanned spirometry results for details.  Skin Testing: {Blank single:19197::"Select foods","Environmental allergy panel","Environmental allergy panel and select foods","Food allergy panel","None","Deferred due to recent antihistamines use"}. Positive test to: ***. Negative test to: ***.  Results discussed with patient/family.   Medication List:  Current Outpatient Medications  Medication Sig Dispense Refill   atenolol (TENORMIN) 25 MG tablet Take 25 mg by mouth 2 (two) times daily.     BIOTIN PO Take 500 mcg by mouth daily.     calcium carbonate (TUMS - DOSED IN MG ELEMENTAL CALCIUM) 500 MG chewable tablet Chew 1 tablet by mouth daily. 1200 mg daily     cetirizine (ZYRTEC) 10 MG tablet Take 10 mg by mouth daily.     cholecalciferol (VITAMIN D3) 25 MCG (1000 UNIT) tablet Take 10,000 Units by mouth daily.     Cyanocobalamin (VITAMIN B 12) 500 MCG TABS Take 1 tablet by mouth daily at 2 PM.     donepezil (ARICEPT) 5 MG tablet  TAKE 1 TABLET BY MOUTH EVERYDAY AT BEDTIME 90 tablet 1   furosemide (LASIX) 20 MG tablet Take 1 tablet (20 mg total) by mouth daily. (Patient taking differently: Take 20  mg by mouth daily. Take 2 tabs on a Tuesday/Thursday and Saturday/ 1 tablet on the other days.) 10 tablet 0   hydrALAZINE (APRESOLINE) 25 MG tablet Take 25 mg by mouth daily. One tablet Mid afternoon     hydrALAZINE (APRESOLINE) 50 MG tablet Take 50 mg by mouth 2 (two) times daily.     lenvatinib 10 mg daily dose (LENVIMA) capsule Take 1 capsule (10 mg total) by mouth daily. (Patient taking differently: Take 10 mg by mouth every other day. Takes 1 tablet every other day on even calendar days of month) 30 capsule 11   levothyroxine (SYNTHROID) 50 MCG tablet Take 1 tablet (50 mcg total) by mouth daily before breakfast. 30 tablet 3   memantine (NAMENDA) 5 MG tablet TAKE 2 TABLETS BY MOUTH TWICE A DAY 360 tablet 1   Multiple Vitamin (MULTIVITAMIN WITH MINERALS) TABS tablet Take 1 tablet by mouth daily.     oxyCODONE (OXY IR/ROXICODONE) 5 MG immediate release tablet Take 1 tablet (5 mg total) by mouth every 6 (six) hours as needed for severe pain. 60 tablet 0   Potassium 99 MG TABS Take 1 tablet by mouth daily.     rivaroxaban (XARELTO) 10 MG TABS tablet Take 1 tablet (10 mg total) by mouth daily. 30 tablet 11   simvastatin (ZOCOR) 20 MG tablet Take 20 mg by mouth every evening.      No current facility-administered medications for this visit.   Facility-Administered Medications Ordered in Other Visits  Medication Dose Route Frequency Provider Last Rate Last Admin   heparin lock flush 100 unit/mL  500 Units Intracatheter Once Gorsuch, Ni, MD       sodium chloride flush (NS) 0.9 % injection 10 mL  10 mL Intracatheter Once Heath Lark, MD       Allergies: Allergies  Allergen Reactions   Ace Inhibitors Anaphylaxis    Shortness of breath    Dilaudid [Hydromorphone Hcl] Other (See Comments)    "total loss of her mind"   Codeine Nausea Only   I reviewed her past medical history, social history, family history, and environmental history and no significant changes have been reported from her previous  visit.  Review of Systems  Constitutional:  Negative for appetite change, chills, fever and unexpected weight change.  HENT:  Negative for congestion and rhinorrhea.   Eyes:  Negative for itching.  Respiratory:  Negative for cough, chest tightness, shortness of breath and wheezing.   Cardiovascular:  Negative for chest pain.  Gastrointestinal:  Negative for abdominal pain.  Genitourinary:  Negative for difficulty urinating.  Skin:  Positive for rash.  Neurological:  Negative for headaches.   Objective: There were no vitals taken for this visit. There is no height or weight on file to calculate BMI. Physical Exam Vitals and nursing note reviewed.  Constitutional:      Appearance: Normal appearance. She is well-developed.  HENT:     Head: Normocephalic and atraumatic.     Right Ear: Tympanic membrane and external ear normal.     Left Ear: Tympanic membrane and external ear normal.     Nose: Nose normal.     Mouth/Throat:     Mouth: Mucous membranes are moist.     Pharynx: Oropharynx is clear.  Eyes:     Conjunctiva/sclera: Conjunctivae  normal.  Cardiovascular:     Rate and Rhythm: Normal rate and regular rhythm.     Heart sounds: Normal heart sounds. No murmur heard.   No friction rub. No gallop.  Pulmonary:     Effort: Pulmonary effort is normal.     Breath sounds: Normal breath sounds. No wheezing, rhonchi or rales.  Musculoskeletal:     Cervical back: Neck supple.     Comments: Right lower extremity edema  Skin:    General: Skin is warm.     Findings: Rash present.     Comments: Raised, nodular erythematous rash on the posterior lower arm area b/l. Similar rash on the right knee area with hyperpigmented skin changes   Neurological:     Mental Status: She is alert and oriented to person, place, and time.  Psychiatric:        Behavior: Behavior normal.   Previous notes and tests were reviewed. The plan was reviewed with the patient/family, and all questions/concerned  were addressed.  It was my pleasure to see [name] today and participate in her care. Please feel free to contact me with any questions or concerns.  Sincerely,  Rexene Alberts, DO Allergy & Immunology  [facility] [place] office: [place] office: [phone]

## 2021-01-02 ENCOUNTER — Inpatient Hospital Stay: Payer: Medicare Other

## 2021-01-02 ENCOUNTER — Other Ambulatory Visit: Payer: Self-pay

## 2021-01-02 ENCOUNTER — Inpatient Hospital Stay (HOSPITAL_BASED_OUTPATIENT_CLINIC_OR_DEPARTMENT_OTHER): Payer: Medicare Other | Admitting: Hematology and Oncology

## 2021-01-02 ENCOUNTER — Encounter: Payer: Self-pay | Admitting: Hematology and Oncology

## 2021-01-02 VITALS — BP 149/67 | HR 68 | Temp 99.6°F | Resp 18 | Ht 58.5 in | Wt 115.0 lb

## 2021-01-02 DIAGNOSIS — Z87828 Personal history of other (healed) physical injury and trauma: Secondary | ICD-10-CM

## 2021-01-02 DIAGNOSIS — D701 Agranulocytosis secondary to cancer chemotherapy: Secondary | ICD-10-CM

## 2021-01-02 DIAGNOSIS — G893 Neoplasm related pain (acute) (chronic): Secondary | ICD-10-CM

## 2021-01-02 DIAGNOSIS — Z7189 Other specified counseling: Secondary | ICD-10-CM

## 2021-01-02 DIAGNOSIS — R296 Repeated falls: Secondary | ICD-10-CM | POA: Diagnosis not present

## 2021-01-02 DIAGNOSIS — Z5112 Encounter for antineoplastic immunotherapy: Secondary | ICD-10-CM | POA: Diagnosis not present

## 2021-01-02 DIAGNOSIS — Z95828 Presence of other vascular implants and grafts: Secondary | ICD-10-CM

## 2021-01-02 DIAGNOSIS — M549 Dorsalgia, unspecified: Secondary | ICD-10-CM | POA: Diagnosis not present

## 2021-01-02 DIAGNOSIS — Z853 Personal history of malignant neoplasm of breast: Secondary | ICD-10-CM

## 2021-01-02 DIAGNOSIS — I10 Essential (primary) hypertension: Secondary | ICD-10-CM | POA: Diagnosis not present

## 2021-01-02 DIAGNOSIS — T451X5A Adverse effect of antineoplastic and immunosuppressive drugs, initial encounter: Secondary | ICD-10-CM

## 2021-01-02 DIAGNOSIS — C541 Malignant neoplasm of endometrium: Secondary | ICD-10-CM

## 2021-01-02 DIAGNOSIS — C7951 Secondary malignant neoplasm of bone: Secondary | ICD-10-CM

## 2021-01-02 LAB — CBC WITH DIFFERENTIAL (CANCER CENTER ONLY)
Abs Immature Granulocytes: 0.04 10*3/uL (ref 0.00–0.07)
Basophils Absolute: 0 10*3/uL (ref 0.0–0.1)
Basophils Relative: 0 %
Eosinophils Absolute: 0 10*3/uL (ref 0.0–0.5)
Eosinophils Relative: 0 %
HCT: 37.8 % (ref 36.0–46.0)
Hemoglobin: 12.6 g/dL (ref 12.0–15.0)
Immature Granulocytes: 0 %
Lymphocytes Relative: 11 %
Lymphs Abs: 1 10*3/uL (ref 0.7–4.0)
MCH: 33.1 pg (ref 26.0–34.0)
MCHC: 33.3 g/dL (ref 30.0–36.0)
MCV: 99.2 fL (ref 80.0–100.0)
Monocytes Absolute: 1.1 10*3/uL — ABNORMAL HIGH (ref 0.1–1.0)
Monocytes Relative: 12 %
Neutro Abs: 6.9 10*3/uL (ref 1.7–7.7)
Neutrophils Relative %: 77 %
Platelet Count: 178 10*3/uL (ref 150–400)
RBC: 3.81 MIL/uL — ABNORMAL LOW (ref 3.87–5.11)
RDW: 13.4 % (ref 11.5–15.5)
WBC Count: 9.2 10*3/uL (ref 4.0–10.5)
nRBC: 0 % (ref 0.0–0.2)

## 2021-01-02 LAB — CMP (CANCER CENTER ONLY)
ALT: 22 U/L (ref 0–44)
AST: 35 U/L (ref 15–41)
Albumin: 3.4 g/dL — ABNORMAL LOW (ref 3.5–5.0)
Alkaline Phosphatase: 56 U/L (ref 38–126)
Anion gap: 11 (ref 5–15)
BUN: 19 mg/dL (ref 8–23)
CO2: 26 mmol/L (ref 22–32)
Calcium: 9.4 mg/dL (ref 8.9–10.3)
Chloride: 103 mmol/L (ref 98–111)
Creatinine: 1.16 mg/dL — ABNORMAL HIGH (ref 0.44–1.00)
GFR, Estimated: 48 mL/min — ABNORMAL LOW (ref >60)
Glucose, Bld: 104 mg/dL — ABNORMAL HIGH (ref 70–99)
Potassium: 3.7 mmol/L (ref 3.5–5.1)
Sodium: 140 mmol/L (ref 135–145)
Total Bilirubin: 0.9 mg/dL (ref 0.3–1.2)
Total Protein: 7.2 g/dL (ref 6.5–8.1)

## 2021-01-02 LAB — TOTAL PROTEIN, URINE DIPSTICK: Protein, ur: NEGATIVE mg/dL

## 2021-01-02 MED ORDER — SODIUM CHLORIDE 0.9 % IV SOLN
200.0000 mg | Freq: Once | INTRAVENOUS | Status: AC
  Administered 2021-01-02: 200 mg via INTRAVENOUS
  Filled 2021-01-02: qty 8

## 2021-01-02 MED ORDER — HEPARIN SOD (PORK) LOCK FLUSH 100 UNIT/ML IV SOLN
500.0000 [IU] | Freq: Once | INTRAVENOUS | Status: AC | PRN
  Administered 2021-01-02: 500 [IU]

## 2021-01-02 MED ORDER — SODIUM CHLORIDE 0.9% FLUSH
10.0000 mL | Freq: Once | INTRAVENOUS | Status: AC
  Administered 2021-01-02: 10 mL

## 2021-01-02 MED ORDER — SODIUM CHLORIDE 0.9% FLUSH
10.0000 mL | INTRAVENOUS | Status: DC | PRN
  Administered 2021-01-02: 10 mL

## 2021-01-02 MED ORDER — SODIUM CHLORIDE 0.9 % IV SOLN: Freq: Once | INTRAVENOUS | Status: AC

## 2021-01-02 NOTE — Assessment & Plan Note (Signed)
She has mild skin laceration over her left knee I placed a clean bandage over it It does not look infected I recommend careful monitoring of her skin lesion She would likely have delayed wound healing due to her treatment

## 2021-01-02 NOTE — Assessment & Plan Note (Signed)
Her blood pressure fluctuated up and down We will proceed with treatment today She will continue aggressive blood pressure monitoring and management with her primary care doctor I plan to repeat imaging study in October

## 2021-01-02 NOTE — Assessment & Plan Note (Signed)
She fell twice recently She is not steady in her gait She suffered some mild skin laceration on the left knee I recommend referral to physical therapy and rehab She is in agreement

## 2021-01-02 NOTE — Progress Notes (Signed)
[place] OFFICE PROGRESS NOTE  Patient Care Team: Shon Baton, MD as PCP - General (Internal Medicine) Heath Lark, MD as Consulting Physician (Hematology and Oncology)  ASSESSMENT & PLAN:  Endometrial cancer [facility]) Her blood pressure fluctuated up and down We will proceed with treatment today She will continue aggressive blood pressure monitoring and management with her primary care doctor I plan to repeat imaging study in October  Essential hypertension I have reviewed her documented blood pressure Her blood pressure fluctuated widely with numbers as low as systolic blood pressure under 100 to within normal range She is not symptomatic She will continue aggressive blood pressure management through her primary care doctor  Recurrent falls while walking She fell twice recently She is not steady in her gait She suffered some mild skin laceration on the left knee I recommend referral to physical therapy and rehab She is in agreement  H/O laceration of skin She has mild skin laceration over her left knee I placed a clean bandage over it It does not look infected I recommend careful monitoring of her skin lesion She would likely have delayed wound healing due to her treatment  Cancer associated pain She is taking oxycodone on a regular basis She denies constipation She will continue current prescribed dose of pain medicine  Orders Placed This Encounter  Procedures   Ambulatory referral to Physical Therapy    Referral Priority:   Routine    Referral Reason:   Specialty Services Required    Requested Specialty:   Physical Therapy    Number of Visits Requested:   1    All questions were answered. The patient knows to call the clinic with any problems, questions or concerns. The total time spent in the appointment was 30 minutes encounter with patients including review of chart and various tests results, discussions about plan of care and coordination of care  plan   Heath Lark, MD 01/02/2021 2:40 PM  INTERVAL HISTORY: Please see below for problem oriented charting. She returns with her husband for further follow-up She fell twice recently since her last time I saw her On both occasions, she fell when she was walking with her husband She denies unsteady gait She denies generalized weakness when she fell She brought with her documented blood pressure over the last few weeks, generally they are within normal range and 2 of her systolic blood pressure is under 100 but she denies dizziness Her cancer associated pain in the lower back is stable On average, she takes 1-2 oxycodone per day SUMMARY OF ONCOLOGIC HISTORY: Oncology History Overview Note  Biopsy of recurrence 06-2014 ER negative. PDL1 testing low at 2% on the uterine cancer She progressed on carboplatin and Paclitaxel for uterine cancer HER 2 negative on pathology [id number] Negative genetic testing  On her lung cancer sample, [facility] testing revealed 10% PD-1 positive EGFR mutation was detected, exon 19 deletion   Endometrial cancer ([place])  12/01/2007 Initial Diagnosis   She has recurrent papillary serous endometrial carcinoma. Briefly she was diagnosed in 2009 with a FIGO IB pappilary serous carcinoma treated with hysterectomy followed by adjuvant chemo and vaginal brachytherapy. She then had a recurrence diagnosed in late 2012 that was deemed not to be surgically resectable, and was treated with 6 cycles of carbo/taxol followed by 5040 cGy of EBRT to the pelvic mass. She was then followed with serial imaging and was noted in June of this year to have slight increase in the size of the mass,  and again in September there was small enlargement of the mass. She had a re-staging PET scan on 03/10/13 that showed FDG activity in the pelvic mass, with no other areas of disease   12/01/2007 Imaging   CT scan of chest, abdomen and pelvis: 1.  Mass like area of decreased attenuation in the  central uterus, consistent with the known endometrial carcinoma.  Deep myometrial invasion is suspected.  Pelvic MRI without and with contrast could be performed for further staging workup if clinically warranted. 2.  No evidence of extrauterine extension or lymphadenopathy. 3.  Sigmoid diverticulosis incidentally noted.   12/15/2007 Surgery   --12/2007 laparoscopic hysterectomy and PLND --05/2008 complted 6 cycles adjuvant carbo/taxol followed by vaginal cuff brachy --03/2011 exploratory lararoscopy, ureterolysis --4/13 completed 6 cycels carbotaxol --8/13 completed EBRT to the left pelvic sidewall    06/10/2008 Imaging   CT scan abdomen and pelvis 1.  Nonspecific mildly prominent inguinal lymph nodes, right greater than left. 2.  Interval hysterectomy without evidence of recurrent pelvic mass or fluid collection.   03/22/2011 Imaging   Ct scan abdomen and pelvis 1.  Local uterine cancer recurrence with two large nodes along the left pelvic sidewall. 2.  No evidence of bowel obstruction, urinary obstruction, or more distant metastasis.   03/31/2011 Relapse/Recurrence   chemotherapy and external beam radiation   05/16/2011 - 09/17/2011 Chemotherapy   She had 6 cycles of carboplatin and Taxol    07/17/2011 Imaging   Ct scan abdomen and pelvis 1.  Interval improvement in the previously demonstrated left pelvic local recurrence of tumor. 2.  No disease progression or complication identified. 3.  Mild bladder wall thickening on the right, nonspecific and possibly related to incomplete distension and/or radiation therapy. 4.  Cholelithiasis   10/11/2011 Imaging   CT scan abdomen and pelvis 1.  Interval enlargement of the left pelvic sidewall masses. 2.  No new nodular disease in the pelvis or lymphadenopathy. 3.  No evidence  of distant metastasis. 4.  Mild hydronephrosis on the right is similar to prior. 5.  Focal thickening of the right aspect the bladder is stable compared to prior.      10/28/2011 - 12/05/2011 Radiation Therapy   radiation for pelvic sidewall recurrence   10/28/2011 - 12/05/2011 Radiation Therapy   10/28/11-12/05/11: Radiotherapy to the left pelvic sidewall   03/18/2012 Imaging   CT abdomen 1.  Today's study demonstrates a positive response to therapy with decreased size of left pelvic sidewall nodal masses, as detailed above.  No new soft tissue masses or new lymphadenopathy is identified within the abdomen or pelvis. Continue attention on follow-up studies is recommended. 2.  Cholelithiasis without findings to suggest acute cholecystitis.  3.  Colonic diverticulosis without findings to suggest acute diverticulitis at this time. 4.  Mild asymmetric urinary bladder wall thickening is unchanged compared to prior examinations and is nonspecific.  No definite bladder wall mass is identified at this time. 5.  Additional findings, similar to prior examinations, as above   06/22/2012 Imaging   CT abdomen 1.  Further decreased size of the left pelvic peripherally enhancing lesion. 2.  No new sites of new or progressive disease. 3. Esophageal air fluid level suggests dysmotility or gastroesophageal reflux. 4.  Cholelithiasis   11/02/2012 Imaging   CT abdomen 1.  The left pelvic side wall lesion demonstrates mild increase in size from previous exam. 2.  No new sites of new or progressive disease. 3.  Cholelithiasis.     01/28/2013 Imaging  CT abdomen 1. Interval small increase in volume of enhancing mass adjacent to the left pelvic sidewall. 2. No evidence of abdominal or pelvic lymphadenopathy   04/02/2013 Surgery   pelvic mass resection with IORT   04/02/2013 - 04/02/2013 Radiation Therapy   04/02/13: Intraoperative radiotherapy to the left pelvis   05/10/2013 PET scan   1. Hypermetabolic mass in the deep left pelvis consistent with metastasis. 2. No additional evidence of local metastasis. No evidence of distant metastasis. 3. Small right lower lobe  pulmonary nodule is not hypermetabolic.   07/27/2013 Imaging   No evidence of recurrent or metastatic disease. Apparent prior resection of the deep left pelvic metastasis.   02/08/2014 Imaging   No CT findings for recurrent or metastatic disease involving the abdomen/ pelvis   06/13/2014 Relapse/Recurrence   + recurrence paraspinous muscle   06/21/2014 Procedure   There is a soft tissue lesion along the left side of L3-L4. This lesion roughly measures up to 2.2 cm. Needle was positioned along the posterior aspect of the lesion. Small amount of air in the paraspinal tissues following needle removal   06/21/2014 Pathology Results   Bone, biopsy, left lumbar paraspinal - METASTATIC POORLY DIFFERENTIATED CARCINOMA, CONSISTENT WITH HIGH GRADE SEROUS CARCINOMA SEE COMMENT. Microscopic Comment The carcinoma demonstrates the following immunophenotype: Cytokeratin 7 - patchy moderate to strong expression Estrogen receptor - negative expression P53 - strong diffuse expression TTF-1 - negative expression WT-1 - negative expression CD56 - focal moderate strong expression Synaptophysin - negative expression GCDFP - negative expression The history of primary endometrial papillary serous carcinoma and primary mammary carcinoma is noted. In the current case, the overall morphologic and immunophenotype are that of poorly differentiated carcinoma, consistent with high grade serous carcinoma.    07/11/2014 - 08/12/2014 Radiation Therapy   07/11/14-08/12/14: 55 Gy in 25 fractions to the Lumbar spine   08/15/2014 Imaging   CT scan of chest, abdomen and pelvis 1. Since the biopsy study of 06/21/2014, similar to slight decrease in size of a left paraspinous lesion at L3-4. 2. No new sites of metastatic disease identified. 3. 8 mm ground-glass nodule in the right lower lobe is grossly similar to 03/10/2013, suggesting a benign etiology. 4. Cholelithiasis. 5. Apparent sigmoid colonic wall thickening which could be due  to underdistention. Colitis felt less likely. 6.  Atherosclerosis, including within the coronary arteries. 7. Pelvic floor laxity.   10/07/2014 Imaging   CT scan of chest, abdomen and pelvis: Stable to slight interval decrease in size of left paraspinous lesion at L3-4. No new sites of metastatic disease. Unchanged ground-glass nodule within the right lower lobe, potentially benign in etiology. Recommend attention on followup. Cholelithiasis. Sigmoid colonic diverticulosis. No CT evidence to suggest acute diverticulitis.   01/19/2015 Imaging   Ct scan of abdomen and pelvis 1. Decrease in size of left paraspinous metastasis. 2. No new sites of disease. 3. Stable ground-glass attenuating nodule within the superior segment of right lower lobe. 4. Gallstones   05/02/2015 Imaging   Ct abdomen and pelvis: Slight interval increase in size of left lumbar paraspinal soft tissue metastasis. No new sites of metastatic disease identified within the abdomen or pelvis.     05/11/2015 PET scan   1. The small soft tissue mass just lateral to the left L3 for neural foramen is hypermetabolic, favoring malignancy. 2. There is also a small focus of hypermetabolic activity between the left L1 and L2 transverse processes, but without a CT correlate. 3. The 13 mm sub solid  nodule in the superior segment right lower lobe is stable from recent exams but has slowly increased in size over the last 7 years. Although not hypermetabolic, the appearance is concerning for the possibility of a low grade adenocarcinoma. 4. Other imaging findings of potential clinical significance: Chronic bilateral maxillary sinusitis. Coronary, aortic arch, and branch vessel atherosclerotic vascular disease. Aortoiliac atherosclerotic vascular disease. Cholelithiasis. Sigmoid colon diverticulosis. Lumbar scoliosis.   07/11/2015 Imaging   Ct scan of chest, abdomen and pelvis: 1. 13 mm mixed solid and sub solid nodule in the posterior right  lower lobe was present in 2009 and has clearly progressed in the interval since that study. Imaging features remain highly concerning for low-grade or well differentiated adenocarcinoma. 2. Slight increase size of in the hypermetabolic, rim enhancing nodule identified adjacent to the left L3-4 neural foramen. Metastatic disease remains a concern. 3. No evidence for discrete soft tissue lesion between the left L1 and 2 transverse processes, the site of focal FDG uptake on the recent PET-CT. 4. Cholelithiasis. 5. Abdominal aortic atherosclerosis   08/03/2015 - 01/16/2016 Chemotherapy   She received carboplatin and Taxol   11/06/2015 PET scan   1. Hypermetabolic left A2-6 paraspinous metastasis (biopsy-proven) is stable in size and slightly decreased in metabolism. 2. Previously described small focus of hypermetabolism between the left L2 and L3 spinous processes has resolved. 3. New mild linear hypermetabolism to the left of the T11-12 spinous processes without discrete mass on the CT images, favor benign activity related activity, recommend attention on follow-up PET-CT.  4. No definite new sites of hypermetabolic metastatic disease. No recurrent hypermetabolic metastatic disease in the pelvis. 5. Interval stability of subsolid 1.3 cm superior segment right lower lobe pulmonary nodule without associated significant metabolism, which has grown compared to the 2009 chest CT study, and remain suspicious for low grade adenocarcinoma. 6. Additional findings include aortic atherosclerosis, coronary atherosclerosis, mild multinodular goiter with no hypermetabolic thyroid nodules, cholelithiasis and moderate sigmoid diverticulosis.   02/12/2016 PET scan   1. Interval increase in size and metabolic activity of the LEFT paraspinal soft tissue metastasis. 2. New activity within the musculature of the LEFT chest wall. Given the unusual location of the paraspinal metastasis cannot exclude a second soft tissue  metastasis to the musculature however favor benign physiologic activity. 3. Stable RIGHT lower lobe pulmonary nodule. 4. No evidence of local recurrence.       04/08/2016 Imaging   Ct scan of chest, abdomen and pelvis: 1. Similar size of a  left paravertebral abdominal lesion. 2. No new or progressive metastatic disease. 3. No correlate for the muscular activity about the lateral left chest wall. 4.  Coronary artery atherosclerosis. Aortic atherosclerosis. 5. Cholelithiasis. 6. Similar right lower lobe pulmonary nodule.   06/03/2016 Imaging   CT abdomen and pelvis 1. Similar to mild enlargement of a paravertebral soft tissue lesion at the L3-4 level. 2. No new sites of disease identified. 3. Cholelithiasis. 4. Hysterectomy. 5.  Aortic atherosclerosis.     09/02/2016 Imaging   CT abdomen and pelvis 1. Continue mild interval increase in size of LEFT paraspinal mass (1-2 mm). 2. No evidence of new metastatic disease in the abdomen pelvis. 3. Post hysterectomy anatomy   09/26/2016 Imaging   MR lumbar spine 1. Left paraspinal metastasis with epicenter adjacent to the left L3 neural foramen does extend into the foramen to the level of the left lateral epidural space (series 9, image 31), but also tracks cephalad and caudal along the L2-L4 lumbar  plexus (series 10, image 15). No extension into the left L2 or L4 neural foramina. And no more distal extension of tumor. 2. Abnormal signal in the medial left psoas and ventral left erector spinae muscles at L2 and L3 is probably denervation related. 3. No bone invasion or osseous metastatic disease. Suspect previous radiation of the L2 through L5 spinal levels. 4. No dural or intradural metastatic disease.     10/22/2016 Procedure   She underwent stereotactic Radiosurgery   10/30/2016 Genetic Testing   Patient has genetic testing done for Inheritable genetic mutation panel. Results revealed patient has no actionable mutation.   01/21/2017  Imaging   1. Reduced size and conspicuity of the left paraspinal mass at the L3-4 level. The mass still present but has enhancement similar to that of adjacent psoas musculature. 2. No new metastatic disease is identified. 3. Other imaging findings of potential clinical significance: Cholelithiasis. Aortic Atherosclerosis (ICD10-I70.0). Sigmoid diverticulosis. Lumbar scoliosis.   05/09/2017 Imaging   Ct scan of abdomen and pelvis 1. Paraspinal mass adjacent to the left side of L3-L4 is less distinct than prior examinations, and is therefore difficult to discretely measure, however, the overall appearance suggests continued positive response to therapy. 2. No new signs of metastatic disease elsewhere in the abdomen or pelvis. 3. Aortic atherosclerosis, in addition to at least right coronary artery disease. Assessment for potential risk factor modification, dietary therapy or pharmacologic therapy may be warranted, if clinically indicated. 4. Colonic diverticulosis without evidence of acute diverticulitis at this time. 5. Additional incidental findings, as above. Aortic Atherosclerosis (ICD10-I70.0).   08/14/2017 Imaging   1. Treated left paraspinal mass centered at L3-4. Mass size is stable but there has been some evolution of tumor characteristics with less central fluid seen today. Recommend continued surveillance. 2. No evidence of untreated metastasis. 3. Degenerative changes and related impingement are described above.   10/28/2017 Imaging   Stable small left paraspinal soft tissue mass. No new or progressive disease within the abdomen or pelvis.   Cholelithiasis.  No radiographic evidence of cholecystitis.   Colonic diverticulosis, without radiographic evidence of diverticulitis.   04/30/2018 Imaging   Stable post treatment change of the partially necrotic LEFT paravertebral mass at L3-L4. 18 x 21 mm cross-section. Mass effect on the LEFT L3 nerve root redemonstrated. Enhancing and  atrophic LEFT psoas is inseparable.   10/15/2018 Imaging   1. Stable post treatment size and appearance of partially necrotic left paravertebral mass at L3-4, measuring 19 x 25 mm on current exam (unchanged when measured at similar level on previous study). Mass effect on the adjacent left L3 nerve root is unchanged. Adjacent enhancing and atrophic left psoas muscle. 2. Left convex scoliosis with associated multilevel degenerative spondylolysis and facet arthrosis, unchanged   10/29/2018 Imaging   Stable small left L3 paraspinal soft tissue mass. No evidence of new or progressive metastatic disease, or other acute findings.   Cholelithiasis.  No radiographic evidence of cholecystitis.   Colonic diverticulosis, without radiographic evidence of diverticulitis.   11/11/2018 PET scan   1. Low level metabolism (max SUV 2.0) associated with the irregular subsolid superior segment right lower lobe 2.1 cm pulmonary nodule. As better depicted on the recent diagnostic chest CT study, this nodule crosses the major fissure into the right upper lobe and has increased in size and density on multiple imaging studies back to 2009. Findings are most suggestive of primary bronchogenic adenocarcinoma. 2. No hypermetabolic thoracic adenopathy. 3. Persistent hypermetabolism (max SUV 8.0) associated with  the known left L3-4 paraspinous metastasis, mildly decreased in metabolism since 02/12/2016 PET-CT. 4. No additional sites of hypermetabolic metastatic disease in the abdomen or pelvis. No metabolic evidence of peritoneal recurrence. No ascites. 5. Chronic findings include: Aortic Atherosclerosis (ICD10-I70.0). Coronary atherosclerosis. Chronic bilateral maxillary sinusitis. Cholelithiasis. Moderate sigmoid diverticulosis.   01/07/2019 - 04/04/2019 Chemotherapy   The patient had carboplatin and Alimta for chemotherapy treatment.     05/03/2019 -  Chemotherapy   The patient had Pembrolizumab and Lenvima for  chemotherapy treatment.     07/23/2019 PET scan   1. 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 No new sites of hypermetabolic metastatic disease on today's study. 2. Stable appearance of surgical changes in the right lung without features to suggest local recurrence or metastatic lung cancer. 3. Cholelithiasis. 4.  Aortic Atherosclerois (ICD10-170.0)   10/20/2019 Imaging   Bilateral venous Doppler US RIGHT:  - Findings consistent with acute and occlusive deep vein thrombosis involving the right common femoral vein, right external iliac vein, right femoral vein, right popliteal vein, right posterior tibial veins, and right peroneal veins.   Unable to adequately visualize the common iliac veins due to bowel gas. The imaged portions of the IVC were patent.     LEFT:  - No evidence of common femoral vein obstruction.    11/08/2019 PET scan   1. Further reduction in activity at the left L3 level and left paraspinal tissues at L3. Maximum vertebral SUV currently 3.9, previously 4.8 on 07/23/2019 and previously 10.0 on 04/14/2019. 2. Late phase healing of previous left transverse process fractures at L3 and L4. Suspected healing right sixth rib fracture anteriorly. 3. There is new focal activity in the vicinity of the right internal jugular vein in the lower neck. Maximum SUV in this vicinity is 5.1. I not see a well-defined lymph node are thyroid lesion to corroborate with the focus, but surveillance in this region is suggested. 4. Other imaging findings of potential clinical significance: Aortic Atherosclerosis (ICD10-I70.0). Coronary atherosclerosis. Cholelithiasis. Sigmoid colon diverticulosis.   08/16/2020 PET scan   Signs of nodal recurrence in the LEFT pelvis as described.   Increased metabolic activity at L3 without discrete visible lesion, subtle sclerosis in this area seen in the context of degenerative change. But seen also at the site  of previous disease suspicious for disease recurrence and with metabolic activity considerably increased compared to previous imaging.   Signs of partial lung resection in the RIGHT chest as before.   Muscular activity in the bilateral abductor compartment of the upper thigh RIGHT greater than LEFT felt to be physiologic. Correlate with any symptoms in this location.   Aortic atherosclerosis.   08/28/2020 -  Chemotherapy    Patient is on Treatment Plan: UTERINE LENVATINIB + PEMBROLIZUMAB Q21D       11/16/2020 PET scan   Resolution of activity associated with LEFT pelvic lymph nodes compared to previous imaging but with worsening of FDG uptake at the site of previous disease in the L3 vertebral body. No discernible lesion aside from subtle sclerosis at this location. No visible paraspinal soft tissue.   New activity in the subjacent L4 vertebral body, new site of disease versus worsening of adjacent degenerative change.   MRI could be considered for further evaluation of above findings as warranted for direct comparison with previous MR imaging.   Focal increased metabolic uptake about the RIGHT shoulder without visible lesion on CT is of uncertain significance, attention  on follow-up.   New area of nodularity about the RIGHT mid chest along the anterior pleural surface without associated increased metabolic activity. Suggest consideration for short interval follow-up.   Cholelithiasis and aortic atherosclerosis.   Aortic Atherosclerosis (ICD10-I70.0).   11/27/2020 Imaging   1. Persistent T1 hypointense, enhancing lesion on the left side of the L3 vertebral body with new enhancement in the superior endplate of the L4 vertebral body on the left, corresponding to areas of increased FDG uptake on recent FDG PET, concerning for metastatic disease. 2. Left paraspinal soft tissue lesion at the L3 level is unchanged. 3. Moderate spinal canal stenosis and severe left neural foraminal narrowing at  L3-4 and L4-5.   Metastatic cancer to bone ([place])  06/27/2014 Initial Diagnosis   Metastatic cancer to bone   Primary lung cancer, right ([place])  11/04/2018 Initial Diagnosis   Primary lung cancer, right ([place])   11/11/2018 PET scan   1. Low level metabolism (max SUV 2.0) associated with the irregular subsolid superior segment right lower lobe 2.1 cm pulmonary nodule. As better depicted on the recent diagnostic chest CT study, this nodule crosses the major fissure into the right upper lobe and has increased in size and density on multiple imaging studies back to 2009. Findings are most suggestive of primary bronchogenic adenocarcinoma. 2. No hypermetabolic thoracic adenopathy. 3. Persistent hypermetabolism (max SUV 8.0) associated with the known left L3-4 paraspinous metastasis, mildly decreased in metabolism since 02/12/2016 PET-CT. 4. No additional sites of hypermetabolic metastatic disease in the abdomen or pelvis. No metabolic evidence of peritoneal recurrence. No ascites. 5. Chronic findings include: Aortic Atherosclerosis (ICD10-I70.0). Coronary atherosclerosis. Chronic bilateral maxillary sinusitis. Cholelithiasis. Moderate sigmoid diverticulosis.   12/07/2018 Pathology Results   1. Lung, resection (segmental or lobe), Right Lobe Superior with endblock wedge of right upper lobe - INVASIVE ADENOCARCINOMA, MODERATELY DIFFERENTIATED, SPANNING 1.6 CM. - THE SURGICAL RESECTION MARGINS ARE NEGATIVE FOR CARCINOMA. - SEE ONCOLOGY TABLE BELOW. 2. Lymph node, biopsy, Level 9 #1 - THERE IS NO EVIDENCE OF CARCINOMA IN 1 OF 1 LYMPH NODE (0/1). 3. Lymph node, biopsy, Level 9 #2 - THERE IS NO EVIDENCE OF CARCINOMA IN 1 OF 1 LYMPH NODE (0/1). 4. Lymph node, biopsy, Level 7 #1 - METASTATIC CARCINOMA IN 1 OF 1 LYMPH NODE (1/1). 5. Lymph node, biopsy, Level 7 #2 - THERE IS NO EVIDENCE OF CARCINOMA IN 1 OF 1 LYMPH NODE (0/1). 6. Lymph node, biopsy, Level 12 #1 - THERE IS NO EVIDENCE OF CARCINOMA IN 1 OF 1  LYMPH NODE (0/1). 7. Lymph node, biopsy, Level 12 #2 - THERE IS NO EVIDENCE OF CARCINOMA IN 1 OF 1 LYMPH NODE (0/1). 8. Lymph node, biopsy, Level 10 #1 - THERE IS NO EVIDENCE OF CARCINOMA IN 1 OF 1 LYMPH NODE (0/1). 9. Lymph node, biopsy, Level 4R #1 - THERE IS NO EVIDENCE OF CARCINOMA IN 1 OF 1 LYMPH NODE (0/1). 10. Lymph node, biopsy, Level 4R #2 - THERE IS NO EVIDENCE OF CARCINOMA IN 1 OF 1 LYMPH NODE (0/1). 11. Lymph node, biopsy, Level 2R #1 - THERE IS NO EVIDENCE OF CARCINOMA IN 1 OF 1 LYMPH NODE (0/1). 12. Lung, resection (segmental or lobe), Right Lobe Superior - BENIGN LUNG PARENCHYMA. - THERE IS NO EVIDENCE OF MALIGNANCY. Procedure: Segmentectomy. Specimen Laterality: Right. Tumor Site: Right superior lobe. Tumor Size: 1.6 cm (gross measurement). Tumor Focality: Unifocal. Histologic Type: Adenocarcinoma, moderately differentiated. Visceral Pleura Invasion: Not identified. Lymphovascular Invasion: Not identified. Direct Invasion of Adjacent Structures: Not  identified. Margins: Negative for carcinoma. Treatment Effect: N/A Regional Lymph Nodes: Number of Lymph Nodes Involved: 1 (level 7) Number of Lymph Nodes Examined: 10 Pathologic Stage Classification (pTNM, AJCC 8th Edition): pT1b, pN2 Ancillary Studies: The tumor cells are positive for Napsin-A, TTF-1, and p53. They are essentially negative for estrogen receptor, PAX-8 and progesterone receptor. Additional studies can be performed upon clinician request. Representative Tumor Block: 1D-1E. (JBK:gt, 12/09/18)   12/07/2018 Surgery   PREOPERATIVE DIAGNOSIS:  Right lung nodule.   POSTOPERATIVE DIAGNOSIS:  Non-small cell carcinoma- suspected primary lung carcinoma, clinical stage T2N01b.   PROCEDURE:   Right video-assisted thoracoscopy, Right lower lobe superior segmentectomy with en bloc wedge resection of right upper lobe, Lymph node dissection, Intercostal nerve block levels 3-9.   SURGEON:  Modesto Charon, MD    FINDINGS:  Mass palpable in posterior aspect of superior segment of right lower lobe, likely involvement across fissure into the upper lobe.  Frozen section revealed non-small cell carcinoma. upper and lower lobe margins clear.   12/23/2018 Cancer Staging   Staging form: Lung, AJCC 8th Edition - Pathologic: Stage IIIA (pT1b, pN2, cM0) - Signed by Heath Lark, MD on 12/23/2018   01/05/2019 Imaging   MRI brain 1. No metastatic disease or acute intracranial abnormality identified. 2. Small chronic parafalcine meningioma size is stable since that described in 2006 (12 x 15 mm). 3. Advanced signal changes in the cerebral white matter and to a lesser extent pons, nonspecific but most commonly due to chronic small vessel disease. 4. Partially visible degenerative cervical spinal stenosis.     01/07/2019 -  Chemotherapy   The patient had carboplatin and Alimta for chemotherapy treatment.     04/14/2019 PET scan   1. 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 As shown on recent MRI of 04/01/2019, there is a new component of invasion into the left lower L3 vertebral body. This new component has a maximum SUV of 10.0, compatible with active malignancy. 2. Interval wedge resection of portions of the right upper lobe and right lower lobe at the site of the prior nodule. There is only minimal low-grade activity along the wedge resection clips, maximum SUV of 1.5. Surveillance is likely warranted. No new nodule identified. 3. Other imaging findings of potential clinical significance: Aortic Atherosclerosis (ICD10-I70.0). Coronary atherosclerosis. Thoracolumbar scoliosis. Cholelithiasis. Sigmoid colon diverticulosis.     05/03/2019 -  Chemotherapy   The patient had Pembrolizumab and Lenvima for chemotherapy treatment.     05/22/2019 Imaging   Ct head Atrophy, chronic microvascular disease.   No acute intracranial abnormality.     03/09/2020 PET scan   1. No evidence of  malignant disease progression. 2. Similar mild metabolic activity within at the sclerotic L3 vertebral body lesion. Reduction in paraspinal muscular metabolic activity at L3 vertebral body level.     REVIEW OF SYSTEMS:   Constitutional: Denies fevers, chills or abnormal weight loss Eyes: Denies blurriness of vision Ears, nose, mouth, throat, and face: Denies mucositis or sore throat Respiratory: Denies cough, dyspnea or wheezes Cardiovascular: Denies palpitation, chest discomfort or lower extremity swelling Gastrointestinal:  Denies nausea, heartburn or change in bowel habits Skin: Denies abnormal skin rashes Lymphatics: Denies new lymphadenopathy or easy bruising Neurological:Denies numbness, tingling or new weaknesses Behavioral/Psych: Mood is stable, no new changes  All other systems were reviewed with the patient and are negative.  I have reviewed the past medical history, past surgical history, social history and family history with the patient and they  are unchanged from previous note.  ALLERGIES:  is allergic to ace inhibitors, dilaudid [hydromorphone hcl], and codeine.  MEDICATIONS:  Current Outpatient Medications  Medication Sig Dispense Refill   atenolol (TENORMIN) 25 MG tablet Take 25 mg by mouth 2 (two) times daily.     BIOTIN PO Take 500 mcg by mouth daily.     calcium carbonate (TUMS - DOSED IN MG ELEMENTAL CALCIUM) 500 MG chewable tablet Chew 1 tablet by mouth daily. 1200 mg daily     cetirizine (ZYRTEC) 10 MG tablet Take 10 mg by mouth daily.     cholecalciferol (VITAMIN D3) 25 MCG (1000 UNIT) tablet Take 10,000 Units by mouth daily.     Cyanocobalamin (VITAMIN B 12) 500 MCG TABS Take 1 tablet by mouth daily at 2 PM.     donepezil (ARICEPT) 5 MG tablet TAKE 1 TABLET BY MOUTH EVERYDAY AT BEDTIME 90 tablet 1   furosemide (LASIX) 20 MG tablet Take 1 tablet (20 mg total) by mouth daily. (Patient taking differently: Take 20 mg by mouth daily. Take 2 tabs on a  Tuesday/Thursday and Saturday/ 1 tablet on the other days.) 10 tablet 0   hydrALAZINE (APRESOLINE) 25 MG tablet Take 25 mg by mouth daily. One tablet Mid afternoon     hydrALAZINE (APRESOLINE) 50 MG tablet Take 50 mg by mouth 2 (two) times daily.     lenvatinib 10 mg daily dose (LENVIMA) capsule Take 1 capsule (10 mg total) by mouth daily. (Patient taking differently: Take 10 mg by mouth every other day. Takes 1 tablet every other day on even calendar days of month) 30 capsule 11   levothyroxine (SYNTHROID) 50 MCG tablet Take 1 tablet (50 mcg total) by mouth daily before breakfast. 30 tablet 3   memantine (NAMENDA) 5 MG tablet TAKE 2 TABLETS BY MOUTH TWICE A DAY 360 tablet 1   Multiple Vitamin (MULTIVITAMIN WITH MINERALS) TABS tablet Take 1 tablet by mouth daily.     oxyCODONE (OXY IR/ROXICODONE) 5 MG immediate release tablet Take 1 tablet (5 mg total) by mouth every 6 (six) hours as needed for severe pain. 60 tablet 0   Potassium 99 MG TABS Take 1 tablet by mouth daily.     rivaroxaban (XARELTO) 10 MG TABS tablet Take 1 tablet (10 mg total) by mouth daily. 30 tablet 11   simvastatin (ZOCOR) 20 MG tablet Take 20 mg by mouth every evening.      No current facility-administered medications for this visit.   Facility-Administered Medications Ordered in Other Visits  Medication Dose Route Frequency Provider Last Rate Last Admin   heparin lock flush 100 unit/mL  500 Units Intracatheter Once Alvy Bimler, Jaquaya Coyle, MD       heparin lock flush 100 unit/mL  500 Units Intracatheter Once PRN Alvy Bimler, Karima Carrell, MD       pembrolizumab (KEYTRUDA) 200 mg in sodium chloride 0.9 % 50 mL chemo infusion  200 mg Intravenous Once Alvy Bimler, Ovid Witman, MD       sodium chloride flush (NS) 0.9 % injection 10 mL  10 mL Intracatheter Once Alvy Bimler, Nezzie Manera, MD       sodium chloride flush (NS) 0.9 % injection 10 mL  10 mL Intracatheter PRN Alvy Bimler, Deandra Gadson, MD        PHYSICAL EXAMINATION: ECOG PERFORMANCE STATUS: 1 - Symptomatic but completely  ambulatory  Vitals:   01/02/21 1251  BP: (!) 149/67  Pulse: 68  Resp: 18  Temp: 99.6 F (37.6 C)  SpO2: 99%   Filed Weights  01/02/21 1251  Weight: 115 lb (52.2 kg)    GENERAL:alert, no distress and comfortable SKIN: Noted skin laceration near her knee measuring approximately 1 inch in size, with clean base and surrounding skin does not look infected EYES: normal, Conjunctiva are pink and non-injected, sclera clear OROPHARYNX:no exudate, no erythema and lips, buccal mucosa, and tongue normal  NECK: supple, thyroid normal size, non-tender, without nodularity LYMPH:  no palpable lymphadenopathy in the cervical, axillary or inguinal LUNGS: clear to auscultation and percussion with normal breathing effort HEART: regular rate & rhythm and no murmurs and no lower extremity edema ABDOMEN:abdomen soft, non-tender and normal bowel sounds Musculoskeletal:no cyanosis of digits and no clubbing  NEURO: alert & oriented x 3 with fluent speech, no focal motor/sensory deficits  LABORATORY DATA:  I have reviewed the data as listed    Component Value Date/Time   NA 140 01/02/2021 1231   NA 140 05/09/2017 0844   K 3.7 01/02/2021 1231   K 4.1 05/09/2017 0844   CL 103 01/02/2021 1231   CO2 26 01/02/2021 1231   CO2 26 05/09/2017 0844   GLUCOSE 104 (H) 01/02/2021 1231   GLUCOSE 82 05/09/2017 0844   BUN 19 01/02/2021 1231   BUN 20.7 05/09/2017 0844   CREATININE 1.16 (H) 01/02/2021 1231   CREATININE 0.7 05/09/2017 0844   CALCIUM 9.4 01/02/2021 1231   CALCIUM 9.3 05/09/2017 0844   PROT 7.2 01/02/2021 1231   PROT 6.8 05/09/2017 0844   ALBUMIN 3.4 (L) 01/02/2021 1231   ALBUMIN 3.6 05/09/2017 0844   AST 35 01/02/2021 1231   AST 27 05/09/2017 0844   ALT 22 01/02/2021 1231   ALT 17 05/09/2017 0844   ALKPHOS 56 01/02/2021 1231   ALKPHOS 43 05/09/2017 0844   BILITOT 0.9 01/02/2021 1231   BILITOT 0.81 05/09/2017 0844   GFRNONAA 48 (L) 01/02/2021 1231   GFRAA >60 02/07/2020 1109    No  results found for: SPEP, UPEP  Lab Results  Component Value Date   WBC 9.2 01/02/2021   NEUTROABS 6.9 01/02/2021   HGB 12.6 01/02/2021   HCT 37.8 01/02/2021   MCV 99.2 01/02/2021   PLT 178 01/02/2021      Chemistry      Component Value Date/Time   NA 140 01/02/2021 1231   NA 140 05/09/2017 0844   K 3.7 01/02/2021 1231   K 4.1 05/09/2017 0844   CL 103 01/02/2021 1231   CO2 26 01/02/2021 1231   CO2 26 05/09/2017 0844   BUN 19 01/02/2021 1231   BUN 20.7 05/09/2017 0844   CREATININE 1.16 (H) 01/02/2021 1231   CREATININE 0.7 05/09/2017 0844      Component Value Date/Time   CALCIUM 9.4 01/02/2021 1231   CALCIUM 9.3 05/09/2017 0844   ALKPHOS 56 01/02/2021 1231   ALKPHOS 43 05/09/2017 0844   AST 35 01/02/2021 1231   AST 27 05/09/2017 0844   ALT 22 01/02/2021 1231   ALT 17 05/09/2017 0844   BILITOT 0.9 01/02/2021 1231   BILITOT 0.81 05/09/2017 0844

## 2021-01-02 NOTE — Assessment & Plan Note (Signed)
I have reviewed her documented blood pressure Her blood pressure fluctuated widely with numbers as low as systolic blood pressure under 100 to within normal range She is not symptomatic She will continue aggressive blood pressure management through her primary care doctor

## 2021-01-02 NOTE — Assessment & Plan Note (Signed)
She is taking oxycodone on a regular basis She denies constipation She will continue current prescribed dose of pain medicine

## 2021-01-02 NOTE — Patient Instructions (Signed)
[facility]  Discharge Instructions: ?Thank you for choosing [facility] to provide your oncology and hematology care.  ? ?If you have a lab appointment with the Cancer Center, please go directly to the Cancer Center and check in at the registration area. ?  ?Wear comfortable clothing and clothing appropriate for easy access to any Portacath or PICC line.  ? ?We strive to give you quality time with your provider. You may need to reschedule your appointment if you arrive late (15 or more minutes).  Arriving late affects you and other patients whose appointments are after yours.  Also, if you miss three or more appointments without notifying the office, you may be dismissed from the clinic at the provider?s discretion.    ?  ?For prescription refill requests, have your pharmacy contact our office and allow 72 hours for refills to be completed.   ? ?Today you received the following chemotherapy and/or immunotherapy agents: Keytruda ?  ?To help prevent nausea and vomiting after your treatment, we encourage you to take your nausea medication as directed. ? ?BELOW ARE SYMPTOMS THAT SHOULD BE REPORTED IMMEDIATELY: ?*FEVER GREATER THAN 100.4 F (38 ?C) OR HIGHER ?*CHILLS OR SWEATING ?*NAUSEA AND VOMITING THAT IS NOT CONTROLLED WITH YOUR NAUSEA MEDICATION ?*UNUSUAL SHORTNESS OF BREATH ?*UNUSUAL BRUISING OR BLEEDING ?*URINARY PROBLEMS (pain or burning when urinating, or frequent urination) ?*BOWEL PROBLEMS (unusual diarrhea, constipation, pain near the anus) ?TENDERNESS IN MOUTH AND THROAT WITH OR WITHOUT PRESENCE OF ULCERS (sore throat, sores in mouth, or a toothache) ?UNUSUAL RASH, SWELLING OR PAIN  ?UNUSUAL VAGINAL DISCHARGE OR ITCHING  ? ?Items with * indicate a potential emergency and should be followed up as soon as possible or go to the Emergency Department if any problems should occur. ? ?Please show the CHEMOTHERAPY ALERT CARD or IMMUNOTHERAPY ALERT CARD at check-in to the  Emergency Department and triage nurse. ? ?Should you have questions after your visit or need to cancel or reschedule your appointment, please contact [facility]  Dept: [phone]  and follow the prompts.  Office hours are 8:00 a.m. to 4:30 p.m. Monday - Friday. Please note that voicemails left after 4:00 p.m. may not be returned until the following business day.  We are closed weekends and major holidays. You have access to a nurse at all times for urgent questions. Please call the main number to the clinic Dept: [phone] and follow the prompts. ? ? ?For any non-urgent questions, you may also contact your provider using MyChart. We now offer e-Visits for anyone 18 and older to request care online for non-urgent symptoms. For details visit [email or web address]. ?  ?Also download the MyChart app! Go to the app store, search "MyChart", open the app, select , and log in with your MyChart username and password. ? ?Due to Covid, a mask is required upon entering the hospital/clinic. If you do not have a mask, one will be given to you upon arrival. For doctor visits, patients may have 1 support person aged 18 or older with them. For treatment visits, patients cannot have anyone with them due to current Covid guidelines and our immunocompromised population.  ? ?

## 2021-01-03 ENCOUNTER — Ambulatory Visit (INDEPENDENT_AMBULATORY_CARE_PROVIDER_SITE_OTHER): Payer: Medicare Other | Admitting: Neurology

## 2021-01-03 ENCOUNTER — Encounter: Payer: Self-pay | Admitting: Neurology

## 2021-01-03 VITALS — BP 183/85 | HR 68 | Ht 59.0 in | Wt 114.6 lb

## 2021-01-03 DIAGNOSIS — R413 Other amnesia: Secondary | ICD-10-CM

## 2021-01-03 LAB — T4: T4, Total: 8 ug/dL (ref 4.5–12.0)

## 2021-01-03 LAB — TSH: TSH: 1.493 u[IU]/mL (ref 0.308–3.960)

## 2021-01-03 NOTE — Progress Notes (Signed)
Reason for visit: Memory disturbance, gait disturbance  [name] is an 80 y.o. female  History of present illness:  [name] is an 80 year old right-handed white female with a history of a slowly progressive memory disturbance.  The patient is on low-dose Aricept and Namenda.  She is tolerating these medications well.  She has had good stabilization of her weight.  She has a history of lung cancer and endometrial cancer and is followed through oncology, she is getting immunotherapy treatments every 3 weeks.  The patient has evidence of extensive small vessel disease by MRI of the brain, she does have risk factors for stroke including hypertension and dyslipidemia.  She is on anticoagulant therapy currently.  She reports some issues with urinary incontinence as well.  She has fallen recently, she fell last week even while using the cane.  She has a walker to use as well.  Her oncologist has set her up for physical therapy for gait training.  The patient does note some numbness of the left foot.  She has some weakness of the left lower extremity.  She has not given up any activities daily because of her memory.  She is still operating motor vehicle without difficulty, she manages her own medications and appointments, her husband does the finances.  The patient reports issues with short-term memory.  She returns for an evaluation.  Past Medical History:  Diagnosis Date   Eczema    Endometrial cancer ([place]) 12/2007   s/p total abdominal hysterectomy at [facility] - ovaries were also removed    Family history of breast cancer    History of breast cancer    right breast -- treated with lumpectomy and radiation therarpy, postoperatively with tamoxifen    History of radiation therapy 10/22/2016 to 10/28/2016   Hypercholesterolemia    Hyperlipidemia    Hypertension    Palpitations    Personal history of radiation therapy 2006   PVC's (premature ventricular contractions)    Radiation  07/11/14-08/12/14   left lumbar paraspinal area 55 gray   S/P radiation therapy 10/28/11-12/05/11   5040 cGy left pelvis   S/P radiation therapy    Intracavitary brachytherapy of uterus   Urticaria     Past Surgical History:  Procedure Laterality Date   ADENOIDECTOMY     APPENDECTOMY     BREAST LUMPECTOMY Right 2000   COLONOSCOPY     3X   FOOT SURGERY     RIGHT   SEGMENTECOMY Right 12/07/2018   Procedure: RIGHT LOWER LOBE SUPERIOR SEGMENTECTOMY;  Surgeon: Melrose Nakayama, MD;  Location: [place] [facility];  Service: Thoracic;  Laterality: Right;   TONSILLECTOMY     TONSILLECTOMY     TOTAL ABDOMINAL HYSTERECTOMY W/ BILATERAL SALPINGOOPHORECTOMY     VIDEO ASSISTED THORACOSCOPY Right 12/07/2018   Procedure: VIDEO ASSISTED THORACOSCOPY;  Surgeon: Melrose Nakayama, MD;  Location: [facility];  Service: Thoracic;  Laterality: Right;    Family History  Problem Relation Age of Onset   Thrombosis Father        coronary thrombosis   Hypertension Father    Heart failure Mother    Asthma Mother    Coronary artery disease Brother    Breast cancer Maternal Aunt        dx in her 91s   Stroke Maternal Uncle    Stroke Maternal Grandfather    Allergic rhinitis Neg Hx    Eczema Neg Hx    Urticaria Neg Hx  Social history:  reports that she quit smoking about 58 years ago. Her smoking use included cigarettes. She has never used smokeless tobacco. She reports current alcohol use. She reports that she does not use drugs.    Allergies  Allergen Reactions   Ace Inhibitors Anaphylaxis    Shortness of breath    Dilaudid [Hydromorphone Hcl] Other (See Comments)    "total loss of her mind"   Codeine Nausea Only    Medications:  Prior to Admission medications   Medication Sig Start Date End Date Taking? Authorizing Provider  atenolol (TENORMIN) 25 MG tablet Take 25 mg by mouth 2 (two) times daily. 01/08/11   Darlin Coco, MD  BIOTIN PO Take 500 mcg by mouth daily.    [provider]   calcium carbonate (TUMS - DOSED IN MG ELEMENTAL CALCIUM) 500 MG chewable tablet Chew 1 tablet by mouth daily. 1200 mg daily    [provider]  cetirizine (ZYRTEC) 10 MG tablet Take 10 mg by mouth daily.    [provider]  cholecalciferol (VITAMIN D3) 25 MCG (1000 UNIT) tablet Take 10,000 Units by mouth daily.    [provider]  Cyanocobalamin (VITAMIN B 12) 500 MCG TABS Take 1 tablet by mouth daily at 2 PM.    [provider]  donepezil (ARICEPT) 5 MG tablet TAKE 1 TABLET BY MOUTH EVERYDAY AT BEDTIME 12/04/20   Suzzanne Cloud, NP  furosemide (LASIX) 20 MG tablet Take 1 tablet (20 mg total) by mouth daily. Patient taking differently: Take 20 mg by mouth daily. Take 2 tabs on a Tuesday/Thursday and Saturday/ 1 tablet on the other days. 01/02/20   Virgel Manifold, MD  hydrALAZINE (APRESOLINE) 25 MG tablet Take 25 mg by mouth daily. One tablet Mid afternoon 10/16/20   [provider]  hydrALAZINE (APRESOLINE) 50 MG tablet Take 50 mg by mouth 2 (two) times daily. 10/18/20   [provider]  lenvatinib 10 mg daily dose (LENVIMA) capsule Take 1 capsule (10 mg total) by mouth daily. Patient taking differently: Take 10 mg by mouth every other day. Takes 1 tablet every other day on even calendar days of month 08/17/20   Heath Lark, MD  levothyroxine (SYNTHROID) 50 MCG tablet Take 1 tablet (50 mcg total) by mouth daily before breakfast. 10/31/20 11/30/20  Heath Lark, MD  memantine (NAMENDA) 5 MG tablet TAKE 2 TABLETS BY MOUTH TWICE A DAY 10/19/20   Suzzanne Cloud, NP  Multiple Vitamin (MULTIVITAMIN WITH MINERALS) TABS tablet Take 1 tablet by mouth daily.    [provider]  oxyCODONE (OXY IR/ROXICODONE) 5 MG immediate release tablet Take 1 tablet (5 mg total) by mouth every 6 (six) hours as needed for severe pain. 11/17/20   Heath Lark, MD  Potassium 99 MG TABS Take 1 tablet by mouth daily.    [provider]  rivaroxaban (XARELTO) 10 MG TABS  tablet Take 1 tablet (10 mg total) by mouth daily. 11/08/19   Heath Lark, MD  simvastatin (ZOCOR) 20 MG tablet Take 20 mg by mouth every evening.     [provider]    ROS:  Out of a complete 14 system review of symptoms, the patient complains only of the following symptoms, and all other reviewed systems are negative.  Memory problems Walking difficulty Urinary incontinence  Blood pressure (!) 183/85, pulse 68, height 4\' 11"  (1.499 m), weight 114 lb 9.6 oz (52 kg).  Physical Exam  General: The patient is alert and cooperative  at the time of the examination.  Skin: No significant peripheral edema is noted.   Neurologic Exam  Mental status: The patient is alert and oriented x 3 at the time of the examination. The Mini-Mental status examination done today shows a total score of 24/30.  The patient is able to name 15 four-legged animals in 60 seconds.   Cranial nerves: Facial symmetry is present. Speech is normal, no aphasia or dysarthria is noted. Extraocular movements are full. Visual fields are full.  Motor: The patient has good strength in all 4 extremities, with exception of 4/5 strength with hip flexion on the left.  Sensory examination: Soft touch sensation is symmetric on the face, arms, and legs.  Coordination: The patient has good finger-nose-finger and heel-to-shin bilaterally.  Gait and station: The patient has a wide-based gait, the patient uses a cane for ambulation.  Gait is unsteady.  Reflexes: Deep tendon reflexes are symmetric.   CT head 05/22/19:  IMPRESSION: Atrophy, chronic microvascular disease.   No acute intracranial abnormality.  * CT scan images were reviewed online. I agree with the written report.    Assessment/Plan:  1.  Memory disturbance  2.  Gait disturbance  3.  Small vessel disease by CT brain  The patient likely has cognitive deficits and balance deficits secondary to extensive small vessel disease.  The patient will be  undergoing physical therapy for gait in the near future.  She will continue the use of Aricept and Namenda.  She will follow-up to this office in 6 months, in the future, she can be followed through Dr. Brett Fairy.  Jill Alexanders MD 01/03/2021 10:19 AM  [facility] [address] [place], [place] [address]  Phone [phone] Fax [phone]

## 2021-01-08 ENCOUNTER — Telehealth: Payer: Self-pay

## 2021-01-08 NOTE — Telephone Encounter (Signed)
Returned husbands call. He is asking about referral to PT. Given phone # [phone] for Tyro to call and schedule appt.

## 2021-01-16 ENCOUNTER — Other Ambulatory Visit: Payer: Self-pay

## 2021-01-16 ENCOUNTER — Ambulatory Visit: Payer: Medicare Other | Attending: Hematology and Oncology | Admitting: Rehabilitation

## 2021-01-16 DIAGNOSIS — R29898 Other symptoms and signs involving the musculoskeletal system: Secondary | ICD-10-CM | POA: Diagnosis not present

## 2021-01-16 DIAGNOSIS — R2681 Unsteadiness on feet: Secondary | ICD-10-CM | POA: Insufficient documentation

## 2021-01-16 DIAGNOSIS — R2689 Other abnormalities of gait and mobility: Secondary | ICD-10-CM | POA: Diagnosis not present

## 2021-01-16 DIAGNOSIS — M6281 Muscle weakness (generalized): Secondary | ICD-10-CM | POA: Insufficient documentation

## 2021-01-16 NOTE — Therapy (Signed)
[facility] [address] [place], [place], [address] Phone: [phone]   Fax:  [phone]  Physical Therapy Evaluation  Patient Details  Name: [name] MRN: [id number] Date of Birth: [birth year] Referring Provider (PT): Heath Lark, MD   Encounter Date: 01/16/2021   PT End of Session - 01/16/21 1509     Visit Number 1    Number of Visits 17    Date for PT Re-Evaluation 04/16/21   POC written for 60 days, cert written for 90 days 2/2 being gone 2 weeks   Authorization Type Medicare, BCBS    Progress Note Due on Visit 10    PT Start Time 1408    PT Stop Time 1449    PT Time Calculation (min) 41 min    Activity Tolerance Patient tolerated treatment well    Behavior During Therapy [facility] for tasks assessed/performed             Past Medical History:  Diagnosis Date   Eczema    Endometrial cancer ([place]) 12/2007   s/p total abdominal hysterectomy at [facility] - ovaries were also removed    Family history of breast cancer    History of breast cancer    right breast -- treated with lumpectomy and radiation therarpy, postoperatively with tamoxifen    History of radiation therapy 10/22/2016 to 10/28/2016   Hypercholesterolemia    Hyperlipidemia    Hypertension    Palpitations    Personal history of radiation therapy 2006   PVC's (premature ventricular contractions)    Radiation 07/11/14-08/12/14   left lumbar paraspinal area 55 gray   S/P radiation therapy 10/28/11-12/05/11   5040 cGy left pelvis   S/P radiation therapy    Intracavitary brachytherapy of uterus   Urticaria     Past Surgical History:  Procedure Laterality Date   ADENOIDECTOMY     APPENDECTOMY     BREAST LUMPECTOMY Right 2000   COLONOSCOPY     3X   FOOT SURGERY     RIGHT   SEGMENTECOMY Right 12/07/2018   Procedure: RIGHT LOWER LOBE SUPERIOR SEGMENTECTOMY;  Surgeon: Melrose Nakayama, MD;  Location: [facility];  Service: Thoracic;  Laterality:  Right;   TONSILLECTOMY     TONSILLECTOMY     TOTAL ABDOMINAL HYSTERECTOMY W/ BILATERAL SALPINGOOPHORECTOMY     VIDEO ASSISTED THORACOSCOPY Right 12/07/2018   Procedure: VIDEO ASSISTED THORACOSCOPY;  Surgeon: Melrose Nakayama, MD;  Location: [facility];  Service: Thoracic;  Laterality: Right;    There were no vitals filed for this visit.    Subjective Assessment - 01/16/21 1414     Subjective "I'm not as steady and sure as I was on my feet.  My husband has to hold my hand." Husband reports that she walks slowly and is painful at times.  She does report that the pain comes and goes and isn't consistent except for back pain.    Patient is accompained by: Family member   [name]   Pertinent History Hx of endometrial and lung cancer with mets to L3/L4.  Is getting immunotherapy every 3 weeks, small vessel disease, memory issues    Limitations Standing;Walking;House hold activities    How long can you stand comfortably? 30-45 mins before needing to sit    How long can you walk comfortably? 30 mins (when they walk, she does not use cane, she holds husbands hand)    Patient Stated Goals "I would like to maintain being as active as  I can" Husband wants her to stop falling.    Currently in Pain? No/denies                [facility] PT Assessment - 01/16/21 1420       Assessment   Medical Diagnosis Repeated falls    Referring Provider (PT) Heath Lark, MD    Onset Date/Surgical Date --   Imbalance began over a year ago     Precautions   Precautions Fall      Balance Screen   Has the patient fallen in the past 6 months Yes    How many times? 2    Has the patient had a decrease in activity level because of a fear of falling?  Yes    Is the patient reluctant to leave their home because of a fear of falling?  Yes      [place] Private residence    Living Arrangements Spouse/significant other    Available Help at Discharge Family;Available 24 hours/day    Type of  Home House    Home Access Stairs to enter    Entrance Stairs-Number of Steps 3-4    Entrance Stairs-Rails Right;Left;Can reach both    Home Layout One level    [place] - single point;Walker - 2 wheels;Grab bars - tub/shower;Shower seat      Prior Function   Level of Independence Independent with basic ADLs;Independent with household mobility with device    Vocation Retired    Leisure read, crosswords puzzles, going to the [employer]    Overall Cognitive Status Within Functional Limits for tasks assessed      Sensation   Light Touch Impaired Detail    Light Touch Impaired Details Impaired LLE   around the knee   Hot/Cold Appears Intact      Coordination   Gross Motor Movements are Fluid and Coordinated Yes    Fine Motor Movements are Fluid and Coordinated Yes      Posture/Postural Control   Posture/Postural Control Postural limitations    Postural Limitations Forward head;Increased thoracic kyphosis;Weight shift right    Posture Comments Pt tends to keep R lateral trunk lean during gait.      ROM / Strength   AROM / PROM / Strength Strength      Strength   Overall Strength Deficits    Overall Strength Comments R hip flex 4/5, L hip flex 2/5, R knee ext 4/5, L knee ext 3/5, R knee flex 4/5, L knee 3+5, B ankle DF 3+/5      Transfers   Transfers Sit to Stand;Stand to Sit    Sit to Stand 5: Supervision    Sit to Stand Details Verbal cues for sequencing;Verbal cues for technique;Verbal cues for precautions/safety    Five time sit to stand comments  15.60 secs with hands on lap from standard arm chair.  Definite R lateral lean during all motions.    Stand to Sit 5: Supervision    Stand to Sit Details (indicate cue type and reason) Verbal cues for sequencing;Verbal cues for technique;Verbal cues for precautions/safety      Ambulation/Gait   Ambulation/Gait Yes    Ambulation/Gait Assistance 5: Supervision;4: Min guard    Ambulation/Gait Assistance Details  Pt S level for gait in straight pathway, however when given balance challenges needs min/guard to correct mild LOB.  LOB tends to always be to the L (towards weaker side).  Ambulation Distance (Feet) 200 Feet    Assistive device Straight cane    Gait Pattern Step-through pattern;Decreased stride length;Decreased hip/knee flexion - left;Decreased weight shift to left;Lateral trunk lean to right;Trunk flexed    Ambulation Surface Level;Indoor    Gait velocity 2.58 ft/sec with use of SPC, close S    Stairs Yes    Stairs Assistance 4: Min guard    Stair Management Technique One rail Left;With cane    Number of Stairs 4    Height of Stairs 6      Standardized Balance Assessment   Standardized Balance Assessment Dynamic Gait Index      Dynamic Gait Index   Level Surface Normal    Change in Gait Speed Mild Impairment    Gait with Horizontal Head Turns Mild Impairment    Gait with Vertical Head Turns Mild Impairment    Gait and Pivot Turn Moderate Impairment    Step Over Obstacle Mild Impairment    Step Around Obstacles Mild Impairment    Steps Moderate Impairment    Total Score 15    DGI comment: Utilize cane throughout, Scores of 19 or less are predictive of falls in older community living adults                        Objective measurements completed on examination: See above findings.               PT Education - 01/16/21 1508     Education Details Educated on evaluation results, POC, goals.  Also educated on recommendation for quad tip cane for increased support.    Person(s) Educated Patient;Spouse    Methods Explanation;Demonstration;Handout    Comprehension Verbalized understanding;Returned demonstration              PT Short Term Goals - 01/16/21 1519       PT SHORT TERM GOAL #1   Title Pt will be independent with initial HEP in order to indicate decreased fall risk and improved functional mobility (Target Date: 03/01/2021-reflects 2 week  vacation)    Time 4    Period Weeks    Status New    Target Date 03/01/21      PT SHORT TERM GOAL #2   Title Pt will improve 5TSS to </= 12 secs without UE support and improved midline posture.    Time 4    Period Weeks    Status New      PT SHORT TERM GOAL #3   Title Pt will improve DGI to >/=18/24 in order to indicate dec fall risk    Time 4    Period Weeks    Status New      PT SHORT TERM GOAL #4   Title Pt will ambulate 200' over indoor surfaces with LRAD scanning environment at S level in order to indicate safety in community.    Time 4    Period Weeks    Status New               PT Long Term Goals - 01/16/21 1525       PT LONG TERM GOAL #1   Title Pt will be independent with final HEP in order to indicate dec fall risk and improved functional mobility. (Target Date: 03/31/2021-reflects 2 week vacation)    Time 8    Period Weeks    Status New    Target Date 03/31/21      PT LONG TERM  GOAL #2   Title Pt will improve DGI score to </=21/24 (without AD) in order to indicate dec fall risk.    Time 8    Period Weeks    Status New      PT LONG TERM GOAL #3   Title Pt will perform 10 sit<>stand without UE support with midline posture in order to indicate improved strength.    Time 8    Period Weeks    Status New      PT LONG TERM GOAL #4   Title Pt will ambulate 500' over unlevel paved outdoor surfaces w/ LRAD at S level while scanning environment in order to perform community/leisure activity safely.    Time 8    Period Weeks    Status New                    Plan - 01/16/21 1510     Clinical Impression Statement Pt is 80 y/o female presenting with history of 2 falls in the past 6 months and overall imbalance that has been increasing over the last 2 years.  Note history of endometrial and lung cancer with met to L3/L4 region that is stable but she is receiving immunotherapy at this time, HTN, and extensive small vessel disease and memory deficits.   Husband is very involved in her care and ambulates with her esp when in community setting.  Upon PT evaluation, note generalized weakness, but specific weakness in LLE (likely due to met to spine) along with decreased sensation to this myotome, gait speed of 2.58 ft/sec which is slightly below community ambulator but she requires a cane and min A at times.  She scored 15/24 on DGI which is indicative of high fall risk and 5TSS time of 15.60 with single UE support indicative of LE weakness and fall risk.  Note that her LLE weakness tends to be causing balance issues as her LOB tends to be to the L and husband reports her falls are to the left. Pt will benefit from skilled OP neuro PT in order to address deficits.    Personal Factors and Comorbidities Comorbidity 3+;Age;Time since onset of injury/illness/exacerbation    Comorbidities see above    Examination-Activity Limitations Lift;Locomotion Level;Squat;Stairs;Stand;Transfers    Examination-Participation Restrictions Community Activity;Shop    Stability/Clinical Decision Making Evolving/Moderate complexity    Clinical Decision Making Moderate    Rehab Potential Good    PT Frequency 2x / week    PT Duration 8 weeks    PT Treatment/Interventions ADLs/Self Care Home Management;Aquatic Therapy;DME Instruction;Gait training;Stair training;Functional mobility training;Therapeutic activities;Therapeutic exercise;Balance training;Neuromuscular re-education;Patient/family education;Passive range of motion;Vestibular    PT Next Visit Plan Establish a really good HEP for balance and strengthening as she will be out of town the last 2 weeks in Sept!  Once back continue to work on high level balance, LLE>RLE strength, gait with turns, compliant surfaces    Consulted and Agree with Plan of Care Patient;Family member/caregiver    Family Member Consulted husband [name]             Patient will benefit from skilled therapeutic intervention in order to improve  the following deficits and impairments:  Abnormal gait, Decreased balance, Decreased knowledge of use of DME, Decreased range of motion, Decreased strength, Impaired flexibility, Impaired sensation, Impaired perceived functional ability, Postural dysfunction  Visit Diagnosis: Unsteadiness on feet  Muscle weakness (generalized)  Left leg weakness  Other abnormalities of gait and mobility     Problem  List Patient Active Problem List   Diagnosis Date Noted   Recurrent falls while walking 01/02/2021   H/O laceration of skin 01/02/2021   Cancer associated pain 11/17/2020   Rash and other nonspecific skin eruption 09/28/2020   Pruritus 09/28/2020   Skin rash 08/28/2020   Leg ulcer ([place]) 01/12/2020   Cellulitis of left leg 12/28/2019   Anemia, chronic disease 12/28/2019   Bilateral edema of lower extremity 12/28/2019   Diarrhea 12/16/2019   Memory impairment 11/29/2019   Malignant cachexia ([place]) 11/08/2019   Deep vein blood clot of right lower extremity ([place]) 11/08/2019   Recurrent epistaxis 11/08/2019   Elevated serum creatinine 08/16/2019   Acquired hypothyroidism 07/26/2019   TIA (transient ischemic attack) 05/25/2019   Goals of care, counseling/discussion 04/19/2019   Dehydration 01/14/2019   Pancytopenia, acquired ([place]) 01/14/2019   Dysuria 12/29/2018   Right lower lobe pulmonary nodule 12/07/2018   Primary lung cancer, right ([place]) 11/04/2018   Essential hypertension 01/24/2017   Genetic testing 10/31/2016   History of breast cancer    Family history of breast cancer    Aortic atherosclerosis ([place]) 06/08/2016   Plantar fasciitis of left foot 12/13/2015   Anemia due to antineoplastic chemotherapy 11/14/2015   Lactose intolerance in adult 11/14/2015   Chemotherapy-induced neuropathy ([place]) 10/08/2015   Dense breast tissue 10/08/2015   Port catheter in place 09/28/2015   Central line complication 94/50/3888   Chemotherapy induced neutropenia ([place]) 08/22/2015   Arterial  hypotension 08/12/2015   HX: breast cancer 02/07/2015   Metastatic cancer to bone ([place]) 06/27/2014   Hypotension 06/27/2011   Endometrial cancer ([place]) 05/15/2011   Hypercholesterolemia 12/28/2010   Palpitations 12/28/2010    Cameron Sprang, PT, MPT [facility] [address] [place], [place], [address] Phone: [phone]   Fax:  [phone] 01/16/21, 3:32 PM   Name: [name] MRN: [id number] Date of Birth: 06-21-1940

## 2021-01-17 DIAGNOSIS — R21 Rash and other nonspecific skin eruption: Secondary | ICD-10-CM | POA: Diagnosis not present

## 2021-01-17 DIAGNOSIS — J309 Allergic rhinitis, unspecified: Secondary | ICD-10-CM | POA: Diagnosis not present

## 2021-01-22 ENCOUNTER — Telehealth: Payer: Self-pay

## 2021-01-22 DIAGNOSIS — Z23 Encounter for immunization: Secondary | ICD-10-CM | POA: Diagnosis not present

## 2021-01-22 NOTE — Telephone Encounter (Signed)
Notified Patient of Prior Authorization Approval for Lidocaine-Prilocaine 2.5% Cream. Medication is authorized 01/22/2021 through 04/21/2021

## 2021-01-23 ENCOUNTER — Other Ambulatory Visit: Payer: Self-pay

## 2021-01-23 ENCOUNTER — Inpatient Hospital Stay: Payer: Medicare Other | Attending: Hematology and Oncology

## 2021-01-23 ENCOUNTER — Inpatient Hospital Stay: Payer: Medicare Other

## 2021-01-23 ENCOUNTER — Encounter: Payer: Self-pay | Admitting: Hematology and Oncology

## 2021-01-23 ENCOUNTER — Inpatient Hospital Stay (HOSPITAL_BASED_OUTPATIENT_CLINIC_OR_DEPARTMENT_OTHER): Payer: Medicare Other | Admitting: Hematology and Oncology

## 2021-01-23 VITALS — BP 145/69 | HR 71 | Temp 98.9°F | Resp 18 | Ht 59.0 in | Wt 108.4 lb

## 2021-01-23 DIAGNOSIS — C541 Malignant neoplasm of endometrium: Secondary | ICD-10-CM

## 2021-01-23 DIAGNOSIS — G893 Neoplasm related pain (acute) (chronic): Secondary | ICD-10-CM | POA: Insufficient documentation

## 2021-01-23 DIAGNOSIS — Z853 Personal history of malignant neoplasm of breast: Secondary | ICD-10-CM

## 2021-01-23 DIAGNOSIS — T451X5A Adverse effect of antineoplastic and immunosuppressive drugs, initial encounter: Secondary | ICD-10-CM

## 2021-01-23 DIAGNOSIS — C7951 Secondary malignant neoplasm of bone: Secondary | ICD-10-CM

## 2021-01-23 DIAGNOSIS — R600 Localized edema: Secondary | ICD-10-CM | POA: Insufficient documentation

## 2021-01-23 DIAGNOSIS — D701 Agranulocytosis secondary to cancer chemotherapy: Secondary | ICD-10-CM

## 2021-01-23 DIAGNOSIS — Z5112 Encounter for antineoplastic immunotherapy: Secondary | ICD-10-CM | POA: Insufficient documentation

## 2021-01-23 DIAGNOSIS — I10 Essential (primary) hypertension: Secondary | ICD-10-CM | POA: Diagnosis not present

## 2021-01-23 DIAGNOSIS — C3411 Malignant neoplasm of upper lobe, right bronchus or lung: Secondary | ICD-10-CM | POA: Diagnosis not present

## 2021-01-23 DIAGNOSIS — I700 Atherosclerosis of aorta: Secondary | ICD-10-CM | POA: Insufficient documentation

## 2021-01-23 DIAGNOSIS — C3491 Malignant neoplasm of unspecified part of right bronchus or lung: Secondary | ICD-10-CM

## 2021-01-23 DIAGNOSIS — J439 Emphysema, unspecified: Secondary | ICD-10-CM | POA: Diagnosis not present

## 2021-01-23 DIAGNOSIS — M549 Dorsalgia, unspecified: Secondary | ICD-10-CM | POA: Diagnosis not present

## 2021-01-23 DIAGNOSIS — R634 Abnormal weight loss: Secondary | ICD-10-CM | POA: Insufficient documentation

## 2021-01-23 DIAGNOSIS — Z79899 Other long term (current) drug therapy: Secondary | ICD-10-CM | POA: Insufficient documentation

## 2021-01-23 DIAGNOSIS — Z7189 Other specified counseling: Secondary | ICD-10-CM

## 2021-01-23 DIAGNOSIS — Z7901 Long term (current) use of anticoagulants: Secondary | ICD-10-CM | POA: Diagnosis not present

## 2021-01-23 DIAGNOSIS — Z885 Allergy status to narcotic agent status: Secondary | ICD-10-CM | POA: Diagnosis not present

## 2021-01-23 DIAGNOSIS — Z95828 Presence of other vascular implants and grafts: Secondary | ICD-10-CM

## 2021-01-23 LAB — CMP (CANCER CENTER ONLY)
ALT: 30 U/L (ref 0–44)
AST: 51 U/L — ABNORMAL HIGH (ref 15–41)
Albumin: 3.5 g/dL (ref 3.5–5.0)
Alkaline Phosphatase: 53 U/L (ref 38–126)
Anion gap: 14 (ref 5–15)
BUN: 17 mg/dL (ref 8–23)
CO2: 26 mmol/L (ref 22–32)
Calcium: 9.7 mg/dL (ref 8.9–10.3)
Chloride: 99 mmol/L (ref 98–111)
Creatinine: 1.06 mg/dL — ABNORMAL HIGH (ref 0.44–1.00)
GFR, Estimated: 53 mL/min — ABNORMAL LOW (ref >60)
Glucose, Bld: 90 mg/dL (ref 70–99)
Potassium: 3.7 mmol/L (ref 3.5–5.1)
Sodium: 139 mmol/L (ref 135–145)
Total Bilirubin: 1.2 mg/dL (ref 0.3–1.2)
Total Protein: 7.1 g/dL (ref 6.5–8.1)

## 2021-01-23 LAB — CBC WITH DIFFERENTIAL (CANCER CENTER ONLY)
Abs Immature Granulocytes: 0.07 10*3/uL (ref 0.00–0.07)
Basophils Absolute: 0.1 10*3/uL (ref 0.0–0.1)
Basophils Relative: 1 %
Eosinophils Absolute: 0 10*3/uL (ref 0.0–0.5)
Eosinophils Relative: 0 %
HCT: 39.7 % (ref 36.0–46.0)
Hemoglobin: 13.3 g/dL (ref 12.0–15.0)
Immature Granulocytes: 1 %
Lymphocytes Relative: 5 %
Lymphs Abs: 0.5 10*3/uL — ABNORMAL LOW (ref 0.7–4.0)
MCH: 32.9 pg (ref 26.0–34.0)
MCHC: 33.5 g/dL (ref 30.0–36.0)
MCV: 98.3 fL (ref 80.0–100.0)
Monocytes Absolute: 0.5 10*3/uL (ref 0.1–1.0)
Monocytes Relative: 5 %
Neutro Abs: 8.1 10*3/uL — ABNORMAL HIGH (ref 1.7–7.7)
Neutrophils Relative %: 88 %
Platelet Count: 148 10*3/uL — ABNORMAL LOW (ref 150–400)
RBC: 4.04 MIL/uL (ref 3.87–5.11)
RDW: 14.1 % (ref 11.5–15.5)
WBC Count: 9.2 10*3/uL (ref 4.0–10.5)
nRBC: 0 % (ref 0.0–0.2)

## 2021-01-23 LAB — TSH: TSH: 2.56 u[IU]/mL (ref 0.350–4.500)

## 2021-01-23 LAB — TOTAL PROTEIN, URINE DIPSTICK: Protein, ur: 30 mg/dL — AB

## 2021-01-23 MED ORDER — SODIUM CHLORIDE 0.9 % IV SOLN: Freq: Once | INTRAVENOUS | Status: DC

## 2021-01-23 MED ORDER — OXYCODONE HCL 5 MG PO TABS: 5.0000 mg | ORAL_TABLET | Freq: Four times a day (QID) | ORAL | 0 refills | Status: DC | PRN

## 2021-01-23 MED ORDER — SODIUM CHLORIDE 0.9 % IV SOLN
200.0000 mg | Freq: Once | INTRAVENOUS | Status: AC
Start: 2021-01-23 — End: 2021-01-23
  Administered 2021-01-23: 200 mg via INTRAVENOUS
  Filled 2021-01-23: qty 8

## 2021-01-23 MED ORDER — SODIUM CHLORIDE 0.9% FLUSH
10.0000 mL | Freq: Once | INTRAVENOUS | Status: AC
  Administered 2021-01-23: 10 mL

## 2021-01-23 MED ORDER — ALTEPLASE 2 MG IJ SOLR
2.0000 mg | Freq: Once | INTRAMUSCULAR | Status: AC
  Administered 2021-01-23: 2 mg
  Filled 2021-01-23: qty 2

## 2021-01-23 NOTE — Assessment & Plan Note (Signed)
I am very concerned about her recent weight loss After treatment today, I plan to repeat imaging study before her next dose In the meantime, I recommend the patient frequent small meals

## 2021-01-23 NOTE — Assessment & Plan Note (Signed)
I have reviewed her documented blood pressure Her blood pressure fluctuated widely with numbers as low as systolic blood pressure under 100 to within normal range She is not symptomatic She will continue aggressive blood pressure management through her primary care doctor

## 2021-01-23 NOTE — Patient Instructions (Signed)
[facility]  Discharge Instructions: ?Thank you for choosing [facility] to provide your oncology and hematology care.  ? ?If you have a lab appointment with the Cancer Center, please go directly to the Cancer Center and check in at the registration area. ?  ?Wear comfortable clothing and clothing appropriate for easy access to any Portacath or PICC line.  ? ?We strive to give you quality time with your provider. You may need to reschedule your appointment if you arrive late (15 or more minutes).  Arriving late affects you and other patients whose appointments are after yours.  Also, if you miss three or more appointments without notifying the office, you may be dismissed from the clinic at the provider?s discretion.    ?  ?For prescription refill requests, have your pharmacy contact our office and allow 72 hours for refills to be completed.   ? ?Today you received the following chemotherapy and/or immunotherapy agents: Keytruda ?  ?To help prevent nausea and vomiting after your treatment, we encourage you to take your nausea medication as directed. ? ?BELOW ARE SYMPTOMS THAT SHOULD BE REPORTED IMMEDIATELY: ?*FEVER GREATER THAN 100.4 F (38 ?C) OR HIGHER ?*CHILLS OR SWEATING ?*NAUSEA AND VOMITING THAT IS NOT CONTROLLED WITH YOUR NAUSEA MEDICATION ?*UNUSUAL SHORTNESS OF BREATH ?*UNUSUAL BRUISING OR BLEEDING ?*URINARY PROBLEMS (pain or burning when urinating, or frequent urination) ?*BOWEL PROBLEMS (unusual diarrhea, constipation, pain near the anus) ?TENDERNESS IN MOUTH AND THROAT WITH OR WITHOUT PRESENCE OF ULCERS (sore throat, sores in mouth, or a toothache) ?UNUSUAL RASH, SWELLING OR PAIN  ?UNUSUAL VAGINAL DISCHARGE OR ITCHING  ? ?Items with * indicate a potential emergency and should be followed up as soon as possible or go to the Emergency Department if any problems should occur. ? ?Please show the CHEMOTHERAPY ALERT CARD or IMMUNOTHERAPY ALERT CARD at check-in to the  Emergency Department and triage nurse. ? ?Should you have questions after your visit or need to cancel or reschedule your appointment, please contact [facility]  Dept: [phone]  and follow the prompts.  Office hours are 8:00 a.m. to 4:30 p.m. Monday - Friday. Please note that voicemails left after 4:00 p.m. may not be returned until the following business day.  We are closed weekends and major holidays. You have access to a nurse at all times for urgent questions. Please call the main number to the clinic Dept: [phone] and follow the prompts. ? ? ?For any non-urgent questions, you may also contact your provider using MyChart. We now offer e-Visits for anyone 18 and older to request care online for non-urgent symptoms. For details visit [email or web address]. ?  ?Also download the MyChart app! Go to the app store, search "MyChart", open the app, select [facility], and log in with your MyChart username and password. ? ?Due to Covid, a mask is required upon entering the hospital/clinic. If you do not have a mask, one will be given to you upon arrival. For doctor visits, patients may have 1 support person aged 18 or older with them. For treatment visits, patients cannot have anyone with them due to current Covid guidelines and our immunocompromised population.  ? ?

## 2021-01-23 NOTE — Assessment & Plan Note (Signed)
She is taking oxycodone on a regular basis She denies constipation She will continue current prescribed dose of pain medicine I refilled her prescription today

## 2021-01-23 NOTE — Progress Notes (Signed)
[facility] PROGRESS NOTE  Patient Care Team: Shon Baton, MD as PCP - General (Internal Medicine) Heath Lark, MD as Consulting Physician (Hematology and Oncology)  ASSESSMENT & PLAN:  Endometrial cancer [facility]) I am very concerned about her recent weight loss After treatment today, I plan to repeat imaging study before her next dose In the meantime, I recommend the patient frequent small meals  Essential hypertension I have reviewed her documented blood pressure Her blood pressure fluctuated widely with numbers as low as systolic blood pressure under 100 to within normal range She is not symptomatic She will continue aggressive blood pressure management through her primary care doctor  Cancer associated pain She is taking oxycodone on a regular basis She denies constipation She will continue current prescribed dose of pain medicine I refilled her prescription today  Orders Placed This Encounter  Procedures   NM PET Image Restage (PS) Skull Base to Thigh (F-18 FDG)    Standing Status:   Future    Standing Expiration Date:   01/23/2022    Order Specific Question:   If indicated for the ordered procedure, I authorize the administration of a radiopharmaceutical per Radiology protocol    Answer:   Yes    Order Specific Question:   Preferred imaging location?    Answer:   [facility]    Order Specific Question:   Radiology Contrast Protocol - do NOT remove file path    Answer:   [email or web address]    All questions were answered. The patient knows to call the clinic with any problems, questions or concerns. The total time spent in the appointment was 20 minutes encounter with patients including review of chart and various tests results, discussions about plan of care and coordination of care plan   Heath Lark, MD 01/23/2021 10:42 AM  INTERVAL HISTORY: Please see below for problem oriented charting. she returns  for treatment follow-up on Lenvima and Keytruda for recurrent uterine cancer She is noticed to have lost weight According to the patient, she thought she ate normal Denies recent nausea or constipation She brought with her documented blood pressure from home Her blood pressure fluctuate widely between very high with systolic in the 219X to as low as 116/70 Her chronic back pain is stable  REVIEW OF SYSTEMS:   Constitutional: Denies fevers, chills Eyes: Denies blurriness of vision Ears, nose, mouth, throat, and face: Denies mucositis or sore throat Respiratory: Denies cough, dyspnea or wheezes Cardiovascular: Denies palpitation, chest discomfort or lower extremity swelling Gastrointestinal:  Denies nausea, heartburn or change in bowel habits Skin: Denies abnormal skin rashes Lymphatics: Denies new lymphadenopathy or easy bruising Neurological:Denies numbness, tingling or new weaknesses Behavioral/Psych: Mood is stable, no new changes  All other systems were reviewed with the patient and are negative.  I have reviewed the past medical history, past surgical history, social history and family history with the patient and they are unchanged from previous note.  ALLERGIES:  is allergic to ace inhibitors, dilaudid [hydromorphone hcl], and codeine.  MEDICATIONS:  Current Outpatient Medications  Medication Sig Dispense Refill   atenolol (TENORMIN) 25 MG tablet Take 25 mg by mouth 2 (two) times daily.     BIOTIN PO Take 500 mcg by mouth daily.     cetirizine (ZYRTEC) 10 MG tablet Take 10 mg by mouth daily.     cholecalciferol (VITAMIN D3) 25 MCG (1000 UNIT) tablet Take 10,000 Units by mouth daily.     Cyanocobalamin (VITAMIN B 12) 500  MCG TABS Take 1 tablet by mouth daily at 2 PM.     donepezil (ARICEPT) 5 MG tablet TAKE 1 TABLET BY MOUTH EVERYDAY AT BEDTIME 90 tablet 1   furosemide (LASIX) 20 MG tablet Take 1 tablet (20 mg total) by mouth daily. (Patient taking differently: Take 20 mg by  mouth daily. Take 2 tabs on a Tuesday/Thursday and Saturday/ 1 tablet on the other days.) 10 tablet 0   hydrALAZINE (APRESOLINE) 25 MG tablet Take 25 mg by mouth daily. One tablet Mid afternoon     hydrALAZINE (APRESOLINE) 50 MG tablet Take 50 mg by mouth 2 (two) times daily.     lenvatinib 10 mg daily dose (LENVIMA) capsule Take 1 capsule (10 mg total) by mouth daily. (Patient taking differently: Take 10 mg by mouth every other day. Takes 1 tablet every other day on even calendar days of month) 30 capsule 11   levothyroxine (SYNTHROID) 50 MCG tablet Take 1 tablet (50 mcg total) by mouth daily before breakfast. 30 tablet 3   memantine (NAMENDA) 5 MG tablet TAKE 2 TABLETS BY MOUTH TWICE A DAY 360 tablet 1   Multiple Vitamin (MULTIVITAMIN WITH MINERALS) TABS tablet Take 1 tablet by mouth daily.     oxyCODONE (OXY IR/ROXICODONE) 5 MG immediate release tablet Take 1 tablet (5 mg total) by mouth every 6 (six) hours as needed for severe pain. 60 tablet 0   Potassium 99 MG TABS Take 1 tablet by mouth daily.     rivaroxaban (XARELTO) 10 MG TABS tablet Take 1 tablet (10 mg total) by mouth daily. 30 tablet 11   simvastatin (ZOCOR) 20 MG tablet Take 20 mg by mouth every evening.      No current facility-administered medications for this visit.   Facility-Administered Medications Ordered in Other Visits  Medication Dose Route Frequency Provider Last Rate Last Admin   heparin lock flush 100 unit/mL  500 Units Intracatheter Once Alvy Bimler, Eyanna Mcgonagle, MD       sodium chloride flush (NS) 0.9 % injection 10 mL  10 mL Intracatheter Once Heath Lark, MD        SUMMARY OF ONCOLOGIC HISTORY: Oncology History Overview Note  Biopsy of recurrence 06-2014 ER negative. 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 Briefly she was diagnosed in 2009 with a FIGO IB pappilary serous carcinoma treated with hysterectomy followed by adjuvant chemo and vaginal brachytherapy. She then had a recurrence diagnosed in late 2012 that was deemed not to be surgically resectable, and was treated with 6 cycles of carbo/taxol followed by 5040 cGy of EBRT to the pelvic mass. She was then followed with serial imaging and was noted in June of this year to have slight increase in the size of the mass, and again in September there was small enlargement of the mass. She had a re-staging PET scan on 03/10/13 that showed FDG activity in the pelvic mass, with no other areas of disease   12/01/2007 Imaging   CT scan of chest, abdomen and pelvis: 1.  Mass like area of decreased attenuation in the central uterus, consistent with the known endometrial carcinoma.  Deep myometrial invasion is suspected.  Pelvic  MRI without and with contrast could be performed for further staging workup if clinically warranted. 2.  No evidence of extrauterine extension or lymphadenopathy. 3.  Sigmoid diverticulosis incidentally noted.   12/15/2007 Surgery   --12/2007 laparoscopic hysterectomy and PLND --05/2008 complted 6 cycles adjuvant carbo/taxol followed by vaginal cuff brachy --03/2011 exploratory lararoscopy, ureterolysis --4/13 completed 6 cycels carbotaxol --8/13 completed EBRT to the left pelvic sidewall    06/10/2008 Imaging   CT scan abdomen and pelvis 1.  Nonspecific mildly prominent inguinal lymph nodes, right greater than left. 2.  Interval hysterectomy without evidence of recurrent pelvic mass or fluid collection.   03/22/2011 Imaging   Ct scan abdomen and pelvis 1.  Local uterine cancer recurrence with two large nodes along the left pelvic sidewall. 2.  No evidence of bowel obstruction,  urinary obstruction, or more distant metastasis.   03/31/2011 Relapse/Recurrence   chemotherapy and external beam radiation   05/16/2011 - 09/17/2011 Chemotherapy   She had 6 cycles of carboplatin and Taxol    07/17/2011 Imaging   Ct scan abdomen and pelvis 1.  Interval improvement in the previously demonstrated left pelvic local recurrence of tumor. 2.  No disease progression or complication identified. 3.  Mild bladder wall thickening on the right, nonspecific and possibly related to incomplete distension and/or radiation therapy. 4.  Cholelithiasis   10/11/2011 Imaging   CT scan abdomen and pelvis 1.  Interval enlargement of the left pelvic sidewall masses. 2.  No new nodular disease in the pelvis or lymphadenopathy. 3.  No evidence  of distant metastasis. 4.  Mild hydronephrosis on the right is similar to prior. 5.  Focal thickening of the right aspect the bladder is stable compared to prior.     10/28/2011 - 12/05/2011 Radiation Therapy   radiation for pelvic sidewall recurrence   10/28/2011 - 12/05/2011 Radiation Therapy   10/28/11-12/05/11: Radiotherapy to the left pelvic sidewall   03/18/2012 Imaging   CT abdomen 1.  Today's study demonstrates a positive response to therapy with decreased size of left pelvic sidewall nodal masses, as detailed above.  No new soft tissue masses or new lymphadenopathy is identified within the abdomen or pelvis. Continue attention on follow-up studies is recommended. 2.  Cholelithiasis without findings to suggest acute cholecystitis.  3.  Colonic diverticulosis without findings to suggest acute diverticulitis at this time. 4.  Mild asymmetric urinary bladder wall thickening is unchanged compared to prior examinations and is nonspecific.  No definite bladder wall mass is identified at this time. 5.  Additional findings, similar to prior examinations, as above   06/22/2012 Imaging   CT abdomen 1.  Further decreased size of the left pelvic peripherally  enhancing lesion. 2.  No new sites of new or progressive disease. 3. Esophageal air fluid level suggests dysmotility or gastroesophageal reflux. 4.  Cholelithiasis   11/02/2012 Imaging   CT abdomen 1.  The left pelvic side wall lesion demonstrates mild increase in size from previous exam. 2.  No new sites of new or progressive disease. 3.  Cholelithiasis.     01/28/2013 Imaging   CT abdomen 1. Interval small increase in volume of enhancing mass adjacent to the left pelvic sidewall. 2. No evidence of abdominal or pelvic lymphadenopathy   04/02/2013 Surgery   pelvic mass resection with IORT   04/02/2013 - 04/02/2013 Radiation Therapy   04/02/13: Intraoperative radiotherapy to the left pelvis   05/10/2013 PET scan   1. Hypermetabolic mass in the deep left pelvis consistent with metastasis.  2. No additional evidence of local metastasis. No evidence of distant metastasis. 3. Small right lower lobe pulmonary nodule is not hypermetabolic.   07/27/2013 Imaging   No evidence of recurrent or metastatic disease. Apparent prior resection of the deep left pelvic metastasis.   02/08/2014 Imaging   No CT findings for recurrent or metastatic disease involving the abdomen/ pelvis   06/13/2014 Relapse/Recurrence   + recurrence paraspinous muscle   06/21/2014 Procedure   There is a soft tissue lesion along the left side of L3-L4. This lesion roughly measures up to 2.2 cm. Needle was positioned along the posterior aspect of the lesion. Small amount of air in the paraspinal tissues following needle removal   06/21/2014 Pathology Results   Bone, biopsy, left lumbar paraspinal - METASTATIC POORLY DIFFERENTIATED CARCINOMA, CONSISTENT WITH HIGH GRADE SEROUS CARCINOMA SEE COMMENT. Microscopic Comment The carcinoma demonstrates the following immunophenotype: Cytokeratin 7 - patchy moderate to strong expression Estrogen receptor - negative expression P53 - strong diffuse expression TTF-1 - negative  expression WT-1 - negative expression CD56 - focal moderate strong expression Synaptophysin - negative expression GCDFP - negative expression The history of primary endometrial papillary serous carcinoma and primary mammary carcinoma is noted. In the current case, the overall morphologic and immunophenotype are that of poorly differentiated carcinoma, consistent with high grade serous carcinoma.    07/11/2014 - 08/12/2014 Radiation Therapy   07/11/14-08/12/14: 55 Gy in 25 fractions to the Lumbar spine   08/15/2014 Imaging   CT scan of chest, abdomen and pelvis 1. Since the biopsy study of 06/21/2014, similar to slight decrease in size of a left paraspinous lesion at L3-4. 2. No new sites of metastatic disease identified. 3. 8 mm ground-glass nodule in the right lower lobe is grossly similar to 03/10/2013, suggesting a benign etiology. 4. Cholelithiasis. 5. Apparent sigmoid colonic wall thickening which could be due to underdistention. Colitis felt less likely. 6.  Atherosclerosis, including within the coronary arteries. 7. Pelvic floor laxity.   10/07/2014 Imaging   CT scan of chest, abdomen and pelvis: Stable to slight interval decrease in size of left paraspinous lesion at L3-4. No new sites of metastatic disease. Unchanged ground-glass nodule within the right lower lobe, potentially benign in etiology. Recommend attention on followup. Cholelithiasis. Sigmoid colonic diverticulosis. No CT evidence to suggest acute diverticulitis.   01/19/2015 Imaging   Ct scan of abdomen and pelvis 1. Decrease in size of left paraspinous metastasis. 2. No new sites of disease. 3. Stable ground-glass attenuating nodule within the superior segment of right lower lobe. 4. Gallstones   05/02/2015 Imaging   Ct abdomen and pelvis: Slight interval increase in size of left lumbar paraspinal soft tissue metastasis. No new sites of metastatic disease identified within the abdomen or pelvis.     05/11/2015 PET scan    1. The small soft tissue mass just lateral to the left L3 for neural foramen is hypermetabolic, favoring malignancy. 2. There is also a small focus of hypermetabolic activity between the left L1 and L2 transverse processes, but without a CT correlate. 3. The 13 mm sub solid nodule in the superior segment right lower lobe is stable from recent exams but has slowly increased in size over the last 7 years. Although not hypermetabolic, the appearance is concerning for the possibility of a low grade adenocarcinoma. 4. Other imaging findings of potential clinical significance: Chronic bilateral maxillary sinusitis. Coronary, aortic arch, and branch vessel atherosclerotic vascular disease. Aortoiliac atherosclerotic vascular disease. Cholelithiasis. Sigmoid colon diverticulosis. Lumbar scoliosis.  07/11/2015 Imaging   Ct scan of chest, abdomen and pelvis: 1. 13 mm mixed solid and sub solid nodule in the posterior right lower lobe was present in 2009 and has clearly progressed in the interval since that study. Imaging features remain highly concerning for low-grade or well differentiated adenocarcinoma. 2. Slight increase size of in the hypermetabolic, rim enhancing nodule identified adjacent to the left L3-4 neural foramen. Metastatic disease remains a concern. 3. No evidence for discrete soft tissue lesion between the left L1 and 2 transverse processes, the site of focal FDG uptake on the recent PET-CT. 4. Cholelithiasis. 5. Abdominal aortic atherosclerosis   08/03/2015 - 01/16/2016 Chemotherapy   She received carboplatin and Taxol   11/06/2015 PET scan   1. Hypermetabolic left A1-5 paraspinous metastasis (biopsy-proven) is stable in size and slightly decreased in metabolism. 2. Previously described small focus of hypermetabolism between the left L2 and L3 spinous processes has resolved. 3. New mild linear hypermetabolism to the left of the T11-12 spinous processes without discrete mass on the CT images,  favor benign activity related activity, recommend attention on follow-up PET-CT.  4. No definite new sites of hypermetabolic metastatic disease. No recurrent hypermetabolic metastatic disease in the pelvis. 5. Interval stability of subsolid 1.3 cm superior segment right lower lobe pulmonary nodule without associated significant metabolism, which has grown compared to the 2009 chest CT study, and remain suspicious for low grade adenocarcinoma. 6. Additional findings include aortic atherosclerosis, coronary atherosclerosis, mild multinodular goiter with no hypermetabolic thyroid nodules, cholelithiasis and moderate sigmoid diverticulosis.   02/12/2016 PET scan   1. Interval increase in size and metabolic activity of the LEFT paraspinal soft tissue metastasis. 2. New activity within the musculature of the LEFT chest wall. Given the unusual location of the paraspinal metastasis cannot exclude a second soft tissue metastasis to the musculature however favor benign physiologic activity. 3. Stable RIGHT lower lobe pulmonary nodule. 4. No evidence of local recurrence.       04/08/2016 Imaging   Ct scan of chest, abdomen and pelvis: 1. Similar size of a  left paravertebral abdominal lesion. 2. No new or progressive metastatic disease. 3. No correlate for the muscular activity about the lateral left chest wall. 4.  Coronary artery atherosclerosis. Aortic atherosclerosis. 5. Cholelithiasis. 6. Similar right lower lobe pulmonary nodule.   06/03/2016 Imaging   CT abdomen and pelvis 1. Similar to mild enlargement of a paravertebral soft tissue lesion at the L3-4 level. 2. No new sites of disease identified. 3. Cholelithiasis. 4. Hysterectomy. 5.  Aortic atherosclerosis.     09/02/2016 Imaging   CT abdomen and pelvis 1. Continue mild interval increase in size of LEFT paraspinal mass (1-2 mm). 2. No evidence of new metastatic disease in the abdomen pelvis. 3. Post hysterectomy anatomy   09/26/2016  Imaging   MR lumbar spine 1. Left paraspinal metastasis with epicenter adjacent to the left L3 neural foramen does extend into the foramen to the level of the left lateral epidural space (series 9, image 31), but also tracks cephalad and caudal along the L2-L4 lumbar plexus (series 10, image 15). No extension into the left L2 or L4 neural foramina. And no more distal extension of tumor. 2. Abnormal signal in the medial left psoas and ventral left erector spinae muscles at L2 and L3 is probably denervation related. 3. No bone invasion or osseous metastatic disease. Suspect previous radiation of the L2 through L5 spinal levels. 4. No dural or intradural metastatic disease.  10/22/2016 Procedure   She underwent stereotactic Radiosurgery   10/30/2016 Genetic Testing   Patient has genetic testing done for Inheritable genetic mutation panel. Results revealed patient has no actionable mutation.   01/21/2017 Imaging   1. Reduced size and conspicuity of the left paraspinal mass at the L3-4 level. The mass still present but has enhancement similar to that of adjacent psoas musculature. 2. No new metastatic disease is identified. 3. Other imaging findings of potential clinical significance: Cholelithiasis. Aortic Atherosclerosis (ICD10-I70.0). Sigmoid diverticulosis. Lumbar scoliosis.   05/09/2017 Imaging   Ct scan of abdomen and pelvis 1. Paraspinal mass adjacent to the left side of L3-L4 is less distinct than prior examinations, and is therefore difficult to discretely measure, however, the overall appearance suggests continued positive response to therapy. 2. No new signs of metastatic disease elsewhere in the abdomen or pelvis. 3. Aortic atherosclerosis, in addition to at least right coronary artery disease. Assessment for potential risk factor modification, dietary therapy or pharmacologic therapy may be warranted, if clinically indicated. 4. Colonic diverticulosis without evidence of acute  diverticulitis at this time. 5. Additional incidental findings, as above. Aortic Atherosclerosis (ICD10-I70.0).   08/14/2017 Imaging   1. Treated left paraspinal mass centered at L3-4. Mass size is stable but there has been some evolution of tumor characteristics with less central fluid seen today. Recommend continued surveillance. 2. No evidence of untreated metastasis. 3. Degenerative changes and related impingement are described above.   10/28/2017 Imaging   Stable small left paraspinal soft tissue mass. No new or progressive disease within the abdomen or pelvis.   Cholelithiasis.  No radiographic evidence of cholecystitis.   Colonic diverticulosis, without radiographic evidence of diverticulitis.   04/30/2018 Imaging   Stable post treatment change of the partially necrotic LEFT paravertebral mass at L3-L4. 18 x 21 mm cross-section. Mass effect on the LEFT L3 nerve root redemonstrated. Enhancing and atrophic LEFT psoas is inseparable.   10/15/2018 Imaging   1. Stable post treatment size and appearance of partially necrotic left paravertebral mass at L3-4, measuring 19 x 25 mm on current exam (unchanged when measured at similar level on previous study). Mass effect on the adjacent left L3 nerve root is unchanged. Adjacent enhancing and atrophic left psoas muscle. 2. Left convex scoliosis with associated multilevel degenerative spondylolysis and facet arthrosis, unchanged   10/29/2018 Imaging   Stable small left L3 paraspinal soft tissue mass. No evidence of new or progressive metastatic disease, or other acute findings.   Cholelithiasis.  No radiographic evidence of cholecystitis.   Colonic diverticulosis, without radiographic evidence of diverticulitis.   11/11/2018 PET scan   1. Low level metabolism (max SUV 2.0) associated with the irregular subsolid superior segment right lower lobe 2.1 cm pulmonary nodule. As better depicted on the recent diagnostic chest CT study, this nodule crosses  the major fissure into the right upper lobe and has increased in size and density on multiple imaging studies back to 2009. Findings are most suggestive of primary bronchogenic adenocarcinoma. 2. No hypermetabolic thoracic adenopathy. 3. Persistent hypermetabolism (max SUV 8.0) associated with the known left L3-4 paraspinous metastasis, mildly decreased in metabolism since 02/12/2016 PET-CT. 4. No additional sites of hypermetabolic metastatic disease in the abdomen or pelvis. No metabolic evidence of peritoneal recurrence. No ascites. 5. Chronic findings include: Aortic Atherosclerosis (ICD10-I70.0). Coronary atherosclerosis. Chronic bilateral maxillary sinusitis. Cholelithiasis. Moderate sigmoid diverticulosis.   01/07/2019 - 04/04/2019 Chemotherapy   The patient had carboplatin and Alimta for chemotherapy treatment.     05/03/2019 -  Chemotherapy   The patient had Pembrolizumab and Lenvima for chemotherapy treatment.     07/23/2019 PET scan   1. 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 No new sites of hypermetabolic metastatic disease on today's study. 2. Stable appearance of surgical changes in the right lung without features to suggest local recurrence or metastatic lung cancer. 3. Cholelithiasis. 4.  Aortic Atherosclerois (ICD10-170.0)   10/20/2019 Imaging   Bilateral venous Doppler US RIGHT:  - Findings consistent with acute and occlusive deep vein thrombosis involving the right common femoral vein, right external iliac vein, right femoral vein, right popliteal vein, right posterior tibial veins, and right peroneal veins.   Unable to adequately visualize the common iliac veins due to bowel gas. The imaged portions of the IVC were patent.     LEFT:  - No evidence of common femoral vein obstruction.    11/08/2019 PET scan   1. Further reduction in activity at the left L3 level and left paraspinal tissues at L3. Maximum vertebral  SUV currently 3.9, previously 4.8 on 07/23/2019 and previously 10.0 on 04/14/2019. 2. Late phase healing of previous left transverse process fractures at L3 and L4. Suspected healing right sixth rib fracture anteriorly. 3. There is new focal activity in the vicinity of the right internal jugular vein in the lower neck. Maximum SUV in this vicinity is 5.1. I not see a well-defined lymph node are thyroid lesion to corroborate with the focus, but surveillance in this region is suggested. 4. Other imaging findings of potential clinical significance: Aortic Atherosclerosis (ICD10-I70.0). Coronary atherosclerosis. Cholelithiasis. Sigmoid colon diverticulosis.   08/16/2020 PET scan   Signs of nodal recurrence in the LEFT pelvis as described.   Increased metabolic activity at L3 without discrete visible lesion, subtle sclerosis in this area seen in the context of degenerative change. But seen also at the site of previous disease suspicious for disease recurrence and with metabolic activity considerably increased compared to previous imaging.   Signs of partial lung resection in the RIGHT chest as before.   Muscular activity in the bilateral abductor compartment of the upper thigh RIGHT greater than LEFT felt to be physiologic. Correlate with any symptoms in this location.   Aortic atherosclerosis.   08/28/2020 -  Chemotherapy    Patient is on Treatment Plan: UTERINE LENVATINIB + PEMBROLIZUMAB Q21D       11/16/2020 PET scan   Resolution of activity associated with LEFT pelvic lymph nodes compared to previous imaging but with worsening of FDG uptake at the site of previous disease in the L3 vertebral body. No discernible lesion aside from subtle sclerosis at this location. No visible paraspinal soft tissue.   New activity in the subjacent L4 vertebral body, new site of disease versus worsening of adjacent degenerative change.   MRI could be considered for further evaluation of above findings as  warranted for direct comparison with previous MR imaging.   Focal increased metabolic uptake about the RIGHT shoulder without visible lesion on CT is of uncertain significance, attention on follow-up.   New area of nodularity about the RIGHT mid chest along the anterior pleural surface without associated increased metabolic activity. Suggest consideration for short interval follow-up.   Cholelithiasis and aortic atherosclerosis.   Aortic Atherosclerosis (ICD10-I70.0).   11/27/2020 Imaging   1. Persistent T1 hypointense, enhancing lesion on the left side of the L3 vertebral body with new enhancement in the superior endplate of the L4 vertebral body on the  left, corresponding to areas of increased FDG uptake on recent FDG PET, concerning for metastatic disease. 2. Left paraspinal soft tissue lesion at the L3 level is unchanged. 3. Moderate spinal canal stenosis and severe left neural foraminal narrowing at L3-4 and L4-5.   Metastatic cancer to bone ([place])  06/27/2014 Initial Diagnosis   Metastatic cancer to bone   Primary lung cancer, right ([place])  11/04/2018 Initial Diagnosis   Primary lung cancer, right ([place])   11/11/2018 PET scan   1. Low level metabolism (max SUV 2.0) associated with the irregular subsolid superior segment right lower lobe 2.1 cm pulmonary nodule. As better depicted on the recent diagnostic chest CT study, this nodule crosses the major fissure into the right upper lobe and has increased in size and density on multiple imaging studies back to 2009. Findings are most suggestive of primary bronchogenic adenocarcinoma. 2. No hypermetabolic thoracic adenopathy. 3. Persistent hypermetabolism (max SUV 8.0) associated with the known left L3-4 paraspinous metastasis, mildly decreased in metabolism since 02/12/2016 PET-CT. 4. No additional sites of hypermetabolic metastatic disease in the abdomen or pelvis. No metabolic evidence of peritoneal recurrence. No ascites. 5. Chronic  findings include: Aortic Atherosclerosis (ICD10-I70.0). Coronary atherosclerosis. Chronic bilateral maxillary sinusitis. Cholelithiasis. Moderate sigmoid diverticulosis.   12/07/2018 Pathology Results   1. Lung, resection (segmental or lobe), Right Lobe Superior with endblock wedge of right upper lobe - INVASIVE ADENOCARCINOMA, MODERATELY DIFFERENTIATED, SPANNING 1.6 CM. - THE SURGICAL RESECTION MARGINS ARE NEGATIVE FOR CARCINOMA. - SEE ONCOLOGY TABLE BELOW. 2. Lymph node, biopsy, Level 9 #1 - THERE IS NO EVIDENCE OF CARCINOMA IN 1 OF 1 LYMPH NODE (0/1). 3. Lymph node, biopsy, Level 9 #2 - THERE IS NO EVIDENCE OF CARCINOMA IN 1 OF 1 LYMPH NODE (0/1). 4. Lymph node, biopsy, Level 7 #1 - METASTATIC CARCINOMA IN 1 OF 1 LYMPH NODE (1/1). 5. Lymph node, biopsy, Level 7 #2 - THERE IS NO EVIDENCE OF CARCINOMA IN 1 OF 1 LYMPH NODE (0/1). 6. Lymph node, biopsy, Level 12 #1 - THERE IS NO EVIDENCE OF CARCINOMA IN 1 OF 1 LYMPH NODE (0/1). 7. Lymph node, biopsy, Level 12 #2 - THERE IS NO EVIDENCE OF CARCINOMA IN 1 OF 1 LYMPH NODE (0/1). 8. Lymph node, biopsy, Level 10 #1 - THERE IS NO EVIDENCE OF CARCINOMA IN 1 OF 1 LYMPH NODE (0/1). 9. Lymph node, biopsy, Level 4R #1 - THERE IS NO EVIDENCE OF CARCINOMA IN 1 OF 1 LYMPH NODE (0/1). 10. Lymph node, biopsy, Level 4R #2 - THERE IS NO EVIDENCE OF CARCINOMA IN 1 OF 1 LYMPH NODE (0/1). 11. Lymph node, biopsy, Level 2R #1 - THERE IS NO EVIDENCE OF CARCINOMA IN 1 OF 1 LYMPH NODE (0/1). 12. Lung, resection (segmental or lobe), Right Lobe Superior - BENIGN LUNG PARENCHYMA. - THERE IS NO EVIDENCE OF MALIGNANCY. Procedure: Segmentectomy. Specimen Laterality: Right. Tumor Site: Right superior lobe. Tumor Size: 1.6 cm (gross measurement). Tumor Focality: Unifocal. Histologic Type: Adenocarcinoma, moderately differentiated. Visceral Pleura Invasion: Not identified. Lymphovascular Invasion: Not identified. Direct Invasion of Adjacent Structures: Not  identified. Margins: Negative for carcinoma. Treatment Effect: N/A Regional Lymph Nodes: Number of Lymph Nodes Involved: 1 (level 7) Number of Lymph Nodes Examined: 10 Pathologic Stage Classification (pTNM, AJCC 8th Edition): pT1b, pN2 Ancillary Studies: The tumor cells are positive for Napsin-A, TTF-1, and p53. They are essentially negative for estrogen receptor, PAX-8 and progesterone receptor. Additional studies can be performed upon clinician request. Representative Tumor Block: 1D-1E. (JBK:gt, 12/09/18)   12/07/2018 Surgery  PREOPERATIVE DIAGNOSIS:  Right lung nodule.   POSTOPERATIVE DIAGNOSIS:  Non-small cell carcinoma- suspected primary lung carcinoma, clinical stage T2N01b.   PROCEDURE:   Right video-assisted thoracoscopy, Right lower lobe superior segmentectomy with en bloc wedge resection of right upper lobe, Lymph node dissection, Intercostal nerve block levels 3-9.   SURGEON:  Modesto Charon, MD   FINDINGS:  Mass palpable in posterior aspect of superior segment of right lower lobe, likely involvement across fissure into the upper lobe.  Frozen section revealed non-small cell carcinoma. upper and lower lobe margins clear.   12/23/2018 Cancer Staging   Staging form: Lung, AJCC 8th Edition - Pathologic: Stage IIIA (pT1b, pN2, cM0) - Signed by Heath Lark, MD on 12/23/2018   01/05/2019 Imaging   MRI brain 1. No metastatic disease or acute intracranial abnormality identified. 2. Small chronic parafalcine meningioma size is stable since that described in 2006 (12 x 15 mm). 3. Advanced signal changes in the cerebral white matter and to a lesser extent pons, nonspecific but most commonly due to chronic small vessel disease. 4. Partially visible degenerative cervical spinal stenosis.     01/07/2019 -  Chemotherapy   The patient had carboplatin and Alimta for chemotherapy treatment.     04/14/2019 PET scan   1. Roughly similar hypermetabolic left paraspinal tumor at the L4  level. As shown on recent MRI of 04/01/2019, there is a new component of invasion into the left lower L3 vertebral body. This new component has a maximum SUV of 10.0, compatible with active malignancy. 2. Interval wedge resection of portions of the right upper lobe and right lower lobe at the site of the prior nodule. There is only minimal low-grade activity along the wedge resection clips, maximum SUV of 1.5. Surveillance is likely warranted. No new nodule identified. 3. Other imaging findings of potential clinical significance: Aortic Atherosclerosis (ICD10-I70.0). Coronary atherosclerosis. Thoracolumbar scoliosis. Cholelithiasis. Sigmoid colon diverticulosis.     05/03/2019 -  Chemotherapy   The patient had Pembrolizumab and Lenvima for chemotherapy treatment.     05/22/2019 Imaging   Ct head Atrophy, chronic microvascular disease.   No acute intracranial abnormality.     03/09/2020 PET scan   1. No evidence of malignant disease progression. 2. Similar mild metabolic activity within at the sclerotic L3 vertebral body lesion. Reduction in paraspinal muscular metabolic activity at L3 vertebral body level.     PHYSICAL EXAMINATION: ECOG PERFORMANCE STATUS: 1 - Symptomatic but completely ambulatory  Vitals:   01/23/21 1032  BP: (!) 145/69  Pulse: 71  Resp: 18  Temp: 98.9 F (37.2 C)  SpO2: 99%   Filed Weights   01/23/21 1032  Weight: 108 lb 6.4 oz (49.2 kg)    GENERAL:alert, no distress and comfortable SKIN: skin color, texture, turgor are normal, no rashes or significant lesions HEART: She has mild lower extremity edema NEURO: alert & oriented x 3 with fluent speech, no focal motor/sensory deficits  LABORATORY DATA:  I have reviewed the data as listed    Component Value Date/Time   NA 140 01/02/2021 1231   NA 140 05/09/2017 0844   K 3.7 01/02/2021 1231   K 4.1 05/09/2017 0844   CL 103 01/02/2021 1231   CO2 26 01/02/2021 1231   CO2 26 05/09/2017 0844   GLUCOSE 104  (H) 01/02/2021 1231   GLUCOSE 82 05/09/2017 0844   BUN 19 01/02/2021 1231   BUN 20.7 05/09/2017 0844   CREATININE 1.16 (H) 01/02/2021 1231   CREATININE 0.7 05/09/2017 0844  CALCIUM 9.4 01/02/2021 1231   CALCIUM 9.3 05/09/2017 0844   PROT 7.2 01/02/2021 1231   PROT 6.8 05/09/2017 0844   ALBUMIN 3.4 (L) 01/02/2021 1231   ALBUMIN 3.6 05/09/2017 0844   AST 35 01/02/2021 1231   AST 27 05/09/2017 0844   ALT 22 01/02/2021 1231   ALT 17 05/09/2017 0844   ALKPHOS 56 01/02/2021 1231   ALKPHOS 43 05/09/2017 0844   BILITOT 0.9 01/02/2021 1231   BILITOT 0.81 05/09/2017 0844   GFRNONAA 48 (L) 01/02/2021 1231   GFRAA >60 02/07/2020 1109    No results found for: SPEP, UPEP  Lab Results  Component Value Date   WBC 9.2 01/23/2021   NEUTROABS 8.1 (H) 01/23/2021   HGB 13.3 01/23/2021   HCT 39.7 01/23/2021   MCV 98.3 01/23/2021   PLT 148 (L) 01/23/2021      Chemistry      Component Value Date/Time   NA 140 01/02/2021 1231   NA 140 05/09/2017 0844   K 3.7 01/02/2021 1231   K 4.1 05/09/2017 0844   CL 103 01/02/2021 1231   CO2 26 01/02/2021 1231   CO2 26 05/09/2017 0844   BUN 19 01/02/2021 1231   BUN 20.7 05/09/2017 0844   CREATININE 1.16 (H) 01/02/2021 1231   CREATININE 0.7 05/09/2017 0844      Component Value Date/Time   CALCIUM 9.4 01/02/2021 1231   CALCIUM 9.3 05/09/2017 0844   ALKPHOS 56 01/02/2021 1231   ALKPHOS 43 05/09/2017 0844   AST 35 01/02/2021 1231   AST 27 05/09/2017 0844   ALT 22 01/02/2021 1231   ALT 17 05/09/2017 0844   BILITOT 0.9 01/02/2021 1231   BILITOT 0.81 05/09/2017 0844

## 2021-01-24 ENCOUNTER — Ambulatory Visit: Payer: Medicare Other

## 2021-01-24 DIAGNOSIS — R2681 Unsteadiness on feet: Secondary | ICD-10-CM | POA: Diagnosis not present

## 2021-01-24 DIAGNOSIS — R2689 Other abnormalities of gait and mobility: Secondary | ICD-10-CM | POA: Diagnosis not present

## 2021-01-24 DIAGNOSIS — M6281 Muscle weakness (generalized): Secondary | ICD-10-CM

## 2021-01-24 DIAGNOSIS — R29898 Other symptoms and signs involving the musculoskeletal system: Secondary | ICD-10-CM | POA: Diagnosis not present

## 2021-01-24 LAB — T4: T4, Total: 8.3 ug/dL (ref 4.5–12.0)

## 2021-01-24 NOTE — Therapy (Signed)
[facility] [address] [place], [place], [address] Phone: [phone]   Fax:  [phone]  Physical Therapy Treatment  Patient Details  Name: [name] MRN: [id number] Date of Birth: February 08, 1941 Referring Provider (PT): Heath Lark, MD   Encounter Date: 01/24/2021   PT End of Session - 01/24/21 0846     Visit Number 2    Number of Visits 17    Date for PT Re-Evaluation 04/16/21   POC written for 60 days, cert written for 90 days 2/2 being gone 2 weeks   Authorization Type Medicare, BCBS    Progress Note Due on Visit 10    PT Start Time 0845    PT Stop Time 0930    PT Time Calculation (min) 45 min    Activity Tolerance Patient tolerated treatment well    Behavior During Therapy [facility] for tasks assessed/performed             Past Medical History:  Diagnosis Date   Eczema    Endometrial cancer ([place]) 12/2007   s/p total abdominal hysterectomy at [facility] - ovaries were also removed    Family history of breast cancer    History of breast cancer    right breast -- treated with lumpectomy and radiation therarpy, postoperatively with tamoxifen    History of radiation therapy 10/22/2016 to 10/28/2016   Hypercholesterolemia    Hyperlipidemia    Hypertension    Palpitations    Personal history of radiation therapy 2006   PVC's (premature ventricular contractions)    Radiation 07/11/14-08/12/14   left lumbar paraspinal area 55 gray   S/P radiation therapy 10/28/11-12/05/11   5040 cGy left pelvis   S/P radiation therapy    Intracavitary brachytherapy of uterus   Urticaria     Past Surgical History:  Procedure Laterality Date   ADENOIDECTOMY     APPENDECTOMY     BREAST LUMPECTOMY Right 2000   COLONOSCOPY     3X   FOOT SURGERY     RIGHT   SEGMENTECOMY Right 12/07/2018   Procedure: RIGHT LOWER LOBE SUPERIOR SEGMENTECTOMY;  Surgeon: Melrose Nakayama, MD;  Location: [facility];  Service: Thoracic;  Laterality:  Right;   TONSILLECTOMY     TONSILLECTOMY     TOTAL ABDOMINAL HYSTERECTOMY W/ BILATERAL SALPINGOOPHORECTOMY     VIDEO ASSISTED THORACOSCOPY Right 12/07/2018   Procedure: VIDEO ASSISTED THORACOSCOPY;  Surgeon: Melrose Nakayama, MD;  Location: [facility];  Service: Thoracic;  Laterality: Right;    There were no vitals filed for this visit.   Subjective Assessment - 01/24/21 0847     Subjective Pt reports that she is having a little back pain. Has been trying to walk a lot. She did get her booster and flu shots. They are going to the beach for 2 weeks Sunday.    Patient is accompained by: Family member   [name]   Pertinent History Hx of endometrial and lung cancer with mets to L3/L4.  Is getting immunotherapy every 3 weeks, small vessel disease, memory issues    Limitations Standing;Walking;House hold activities    How long can you stand comfortably? 30-45 mins before needing to sit    How long can you walk comfortably? 30 mins (when they walk, she does not use cane, she holds husbands hand)    Patient Stated Goals "I would like to maintain being as active as I can" Husband wants her to stop falling.    Currently in Pain?  Yes    Pain Score 4     Pain Location Back    Pain Orientation Right    Pain Descriptors / Indicators Aching    Pain Type Chronic pain    Pain Onset More than a month ago    Pain Frequency Intermittent                               [facility] Adult PT Treatment/Exercise - 01/24/21 0850       Ambulation/Gait   Ambulation/Gait Yes    Ambulation/Gait Assistance 5: Supervision;4: Min guard    Ambulation/Gait Assistance Details around in clinic between activities    Assistive device Straight cane    Gait Pattern Step-through pattern;Wide base of support;Decreased step length - right;Decreased step length - left    Ambulation Surface Level;Indoor      Neuro Re-ed    Neuro Re-ed Details  Corner balance exercises with chair in front: Standing feet apart  eyes open x 30 sec then eyes closed x 30 sec, feet together eyes open x 30 sec, staggered stance x 30 sec each position. Pt was a little more challenged with LLE posterior. Close SBA/CGA for safety      Exercises   Exercises Other Exercises    Other Exercises  Hooklying: hip flexion with TA (transverse abdominus contraction)  x 10, heel slides x 10, left clamshell x 10. Sit to stand x 10 without UE support. Verbal cues for form with exercises. No increase in pain throughout. Pt was more challenged with moving LLE. Cued to try not to let leg fall with heel slide on left. Also cued to engage core to help support back.                     PT Education - 01/24/21 1957     Education Details Initial HEP    Person(s) Educated Patient;Spouse    Methods Explanation;Demonstration;Handout    Comprehension Verbalized understanding;Returned demonstration              PT Short Term Goals - 01/16/21 1519       PT SHORT TERM GOAL #1   Title Pt will be independent with initial HEP in order to indicate decreased fall risk and improved functional mobility (Target Date: 03/01/2021-reflects 2 week vacation)    Time 4    Period Weeks    Status New    Target Date 03/01/21      PT SHORT TERM GOAL #2   Title Pt will improve 5TSS to </= 12 secs without UE support and improved midline posture.    Time 4    Period Weeks    Status New      PT SHORT TERM GOAL #3   Title Pt will improve DGI to >/=18/24 in order to indicate dec fall risk    Time 4    Period Weeks    Status New      PT SHORT TERM GOAL #4   Title Pt will ambulate 200' over indoor surfaces with LRAD scanning environment at S level in order to indicate safety in community.    Time 4    Period Weeks    Status New               PT Long Term Goals - 01/16/21 1525       PT LONG TERM GOAL #1   Title Pt will be independent with  final HEP in order to indicate dec fall risk and improved functional mobility. (Target Date:  03/31/2021-reflects 2 week vacation)    Time 8    Period Weeks    Status New    Target Date 03/31/21      PT LONG TERM GOAL #2   Title Pt will improve DGI score to </=21/24 (without AD) in order to indicate dec fall risk.    Time 8    Period Weeks    Status New      PT LONG TERM GOAL #3   Title Pt will perform 10 sit<>stand without UE support with midline posture in order to indicate improved strength.    Time 8    Period Weeks    Status New      PT LONG TERM GOAL #4   Title Pt will ambulate 500' over unlevel paved outdoor surfaces w/ LRAD at S level while scanning environment in order to perform community/leisure activity safely.    Time 8    Period Weeks    Status New                   Plan - 01/24/21 1957     Clinical Impression Statement PT focused on establishing initial HEP to prepare for pt being out of town for 2 weeks. Pt had no increase in pain in back throughout activities. LLE was more challenged with strengthening especially with left hip flexion.    Personal Factors and Comorbidities Comorbidity 3+;Age;Time since onset of injury/illness/exacerbation    Comorbidities see above    Examination-Activity Limitations Lift;Locomotion Level;Squat;Stairs;Stand;Transfers    Examination-Participation Restrictions Community Activity;Shop    Stability/Clinical Decision Making Evolving/Moderate complexity    Rehab Potential Good    PT Frequency 2x / week    PT Duration 8 weeks    PT Treatment/Interventions ADLs/Self Care Home Management;Aquatic Therapy;DME Instruction;Gait training;Stair training;Functional mobility training;Therapeutic activities;Therapeutic exercise;Balance training;Neuromuscular re-education;Patient/family education;Passive range of motion;Vestibular    PT Next Visit Plan Review HEP for balance and strengthening as she will be out of town the last 2 weeks in Sept. May add in red theraband for clamshell. Gait with cane. Once back continue to work on  high level balance, LLE>RLE strength, gait with turns, compliant surfaces. Mets at L3/4 so be careful to protect back.    Consulted and Agree with Plan of Care Patient;Family member/caregiver    Family Member Consulted husband [name]             Patient will benefit from skilled therapeutic intervention in order to improve the following deficits and impairments:  Abnormal gait, Decreased balance, Decreased knowledge of use of DME, Decreased range of motion, Decreased strength, Impaired flexibility, Impaired sensation, Impaired perceived functional ability, Postural dysfunction  Visit Diagnosis: Other abnormalities of gait and mobility  Muscle weakness (generalized)  Unsteadiness on feet     Problem List Patient Active Problem List   Diagnosis Date Noted   Recurrent falls while walking 01/02/2021   H/O laceration of skin 01/02/2021   Cancer associated pain 11/17/2020   Rash and other nonspecific skin eruption 09/28/2020   Pruritus 09/28/2020   Skin rash 08/28/2020   Leg ulcer ([place]) 01/12/2020   Cellulitis of left leg 12/28/2019   Anemia, chronic disease 12/28/2019   Bilateral edema of lower extremity 12/28/2019   Diarrhea 12/16/2019   Memory impairment 11/29/2019   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   Deep vein blood clot of right lower extremity ([place]) 11/08/2019   Recurrent epistaxis 11/08/2019  Elevated serum creatinine 08/16/2019   Acquired hypothyroidism 07/26/2019   TIA (transient ischemic attack) 05/25/2019   Goals of care, counseling/discussion 04/19/2019   Dehydration 01/14/2019   Pancytopenia, acquired ([place]) 01/14/2019   Dysuria 12/29/2018   Right lower lobe pulmonary nodule 12/07/2018   Primary lung cancer, right ([place]) 11/04/2018   Essential hypertension 01/24/2017   Genetic testing 10/31/2016   History of breast cancer    Family history of breast cancer    Aortic atherosclerosis ([place]) 06/08/2016   Plantar fasciitis of left foot 12/13/2015   Anemia due  to antineoplastic chemotherapy 11/14/2015   Lactose intolerance in adult 11/14/2015   Chemotherapy-induced neuropathy ([place]) 10/08/2015   Dense breast tissue 10/08/2015   Port catheter in place 09/28/2015   Central line complication 49/67/5916   Chemotherapy induced neutropenia ([employer]) 08/22/2015   Arterial hypotension 08/12/2015   HX: breast cancer 02/07/2015   Metastatic cancer to bone ([place]) 06/27/2014   Hypotension 06/27/2011   Endometrial cancer ([place]) 05/15/2011   Hypercholesterolemia 12/28/2010   Palpitations 12/28/2010    Electa Sniff, PT, DPT, NCS 01/24/2021, 8:02 PM  [facility] [address] [place], [place], [address] Phone: [phone]   Fax:  [phone]  Name: DESMA WILKOWSKI MRN: [id number] Date of Birth: [birth year]

## 2021-01-24 NOTE — Patient Instructions (Signed)
Access Code: [email or web address] URL: [email or web address] Date: 01/24/2021 Prepared by: Cherly Anderson  Exercises Supine March - 2 x daily - 5 x weekly - 2 sets - 10 reps Supine Heel Slide - 2 x daily - 5 x weekly - 2 sets - 10 reps Clamshell - 2 x daily - 5 x weekly - 2 sets - 10 reps Sit to Stand with Armchair - 2 x daily - 5 x weekly - 2 sets - 5 reps Tandem Stance - 2 x daily - 5 x weekly - 1 sets - 3 reps - 20 sec hold Standing Balance in Corner with Eyes Closed - 2 x daily - 5 x weekly - 1 sets - 3 reps - 20 sec hold Romberg Stance - 2 x daily - 5 x weekly - 1 sets - 3 reps - 20 sec hold

## 2021-01-25 ENCOUNTER — Other Ambulatory Visit: Payer: Self-pay | Admitting: Hematology and Oncology

## 2021-01-26 ENCOUNTER — Encounter: Payer: Self-pay | Admitting: Physical Therapy

## 2021-01-26 ENCOUNTER — Other Ambulatory Visit: Payer: Self-pay

## 2021-01-26 ENCOUNTER — Ambulatory Visit: Payer: Medicare Other | Admitting: Physical Therapy

## 2021-01-26 DIAGNOSIS — R2689 Other abnormalities of gait and mobility: Secondary | ICD-10-CM | POA: Diagnosis not present

## 2021-01-26 DIAGNOSIS — R29898 Other symptoms and signs involving the musculoskeletal system: Secondary | ICD-10-CM | POA: Diagnosis not present

## 2021-01-26 DIAGNOSIS — M6281 Muscle weakness (generalized): Secondary | ICD-10-CM

## 2021-01-26 DIAGNOSIS — R2681 Unsteadiness on feet: Secondary | ICD-10-CM | POA: Diagnosis not present

## 2021-01-26 NOTE — Patient Instructions (Signed)
Access Code: [email or web address] URL: [email or web address] Date: 01/26/2021 Prepared by: Willow Ora  Exercises Supine March - 1 x daily - 5 x weekly - 2 sets - 10 reps Supine Heel Slide - 1 x daily - 5 x weekly - 2 sets - 10 reps Clamshell - 1 x daily - 5 x weekly - 2 sets - 10 reps Sit to Stand with Armchair - 1 x daily - 5 x weekly - 2 sets - 5 reps Tandem Stance - 1 x daily - 5 x weekly - 1 sets - 3 reps - 20 sec hold Standing Balance in Corner with Eyes Closed - 1 x daily - 5 x weekly - 1 sets - 3 reps - 20 sec hold Romberg Stance - 1 x daily - 5 x weekly - 1 sets - 3 reps - 20 sec hold

## 2021-01-29 NOTE — Therapy (Signed)
[facility] [address] [place], [place], [address] Phone: [phone]   Fax:  [phone]  Physical Therapy Treatment  Patient Details  Name: [name] MRN: [id number] Date of Birth: 04-Oct-1940 Referring Provider (PT): Heath Lark, MD   Encounter Date: 01/26/2021   01/26/21 1540  PT Visits / Re-Eval  Visit Number 3  Number of Visits 17  Date for PT Re-Evaluation 04/16/21 (POC written for 60 days, cert written for 90 days 2/2 being gone 2 weeks)  Authorization  Authorization Type Medicare, BCBS  Progress Note Due on Visit 10  PT Time Calculation  PT Start Time 6160  PT Stop Time 7371  PT Time Calculation (min) 39 min  PT - End of Session  Activity Tolerance Patient tolerated treatment well  Behavior During Therapy [facility] for tasks assessed/performed     Past Medical History:  Diagnosis Date   Eczema    Endometrial cancer ([place]) 12/2007   s/p total abdominal hysterectomy at [facility] - ovaries were also removed    Family history of breast cancer    History of breast cancer    right breast -- treated with lumpectomy and radiation therarpy, postoperatively with tamoxifen    History of radiation therapy 10/22/2016 to 10/28/2016   Hypercholesterolemia    Hyperlipidemia    Hypertension    Palpitations    Personal history of radiation therapy 2006   PVC's (premature ventricular contractions)    Radiation 07/11/14-08/12/14   left lumbar paraspinal area 55 gray   S/P radiation therapy 10/28/11-12/05/11   5040 cGy left pelvis   S/P radiation therapy    Intracavitary brachytherapy of uterus   Urticaria     Past Surgical History:  Procedure Laterality Date   ADENOIDECTOMY     APPENDECTOMY     BREAST LUMPECTOMY Right 2000   COLONOSCOPY     3X   FOOT SURGERY     RIGHT   SEGMENTECOMY Right 12/07/2018   Procedure: RIGHT LOWER LOBE SUPERIOR SEGMENTECTOMY;  Surgeon: Melrose Nakayama, MD;  Location: [place];   Service: Thoracic;  Laterality: Right;   TONSILLECTOMY     TONSILLECTOMY     TOTAL ABDOMINAL HYSTERECTOMY W/ BILATERAL SALPINGOOPHORECTOMY     VIDEO ASSISTED THORACOSCOPY Right 12/07/2018   Procedure: VIDEO ASSISTED THORACOSCOPY;  Surgeon: Melrose Nakayama, MD;  Location: [facility];  Service: Thoracic;  Laterality: Right;    There were no vitals filed for this visit.    01/26/21 1538  Symptoms/Limitations  Subjective No new complaints. No falls. Some back pain today, took some pain meds.  Patient is accompained by: Family member (spouse [name])  Pertinent History Hx of endometrial and lung cancer with mets to L3/L4.  Is getting immunotherapy every 3 weeks, small vessel disease, memory issues  Limitations Standing;Walking;House hold activities  How long can you stand comfortably? 30-45 mins before needing to sit  How long can you walk comfortably? 30 mins (when they walk, she does not use cane, she holds husbands hand)  Patient Stated Goals "I would like to maintain being as active as I can" Husband wants her to stop falling.  Pain Assessment  Currently in Pain? Yes  Pain Score 7  Pain Location Back  Pain Orientation Right  Pain Descriptors / Indicators Aching  Pain Type Chronic pain  Pain Onset More than a month ago  Pain Frequency Intermittent  Aggravating Factors  unsure, not feeling well this weel  Pain Relieving Factors pain meds  01/26/21 1600  Transfers  Transfers Sit to Stand;Stand to Sit  Sit to Stand 5: Supervision  Stand to Sit 5: Supervision  Ambulation/Gait  Ambulation/Gait Yes  Ambulation/Gait Assistance 5: Supervision;4: Min guard  Ambulation Distance (Feet)  (around clinic with session)  Assistive device Straight cane  Gait Pattern Step-through pattern;Wide base of support;Decreased step length - right;Decreased step length - left  Ambulation Surface Level;Indoor  Neuro Re-ed   Neuro Re-ed Details  for balance/muscle re-ed: standing on airex with UE  support on sturdy surface: heel<>toe raises, alternating slow marching and mini squats for 10 reps each, min guard assist for balance with cues on technique; seated with feet on airex: sit<>stands x 10 reps with min guard to min assist, cues on weight shifting and controlled sitting down; standing on solid foor with 2 foam bubbles: alternating forward foot taps, then cross foot taps for ~8 reps each with min assist for balance. Cues for wide stance and weight shifting to assist with balance.   Exercises  Exercises Other Exercises  Other Exercises  reviewed HEP   Issued as HEP this session:   Access Code: [email or web address] URL: [email or web address] Date: 01/26/2021 Prepared by: Willow Ora   Exercises Supine March - 1 x daily - 5 x weekly - 2 sets - 10 reps Supine Heel Slide - 1 x daily - 5 x weekly - 2 sets - 10 reps Clamshell - 1 x daily - 5 x weekly - 2 sets - 10 reps Sit to Stand with Armchair - 1 x daily - 5 x weekly - 2 sets - 5 reps Tandem Stance - 1 x daily - 5 x weekly - 1 sets - 3 reps - 20 sec hold Standing Balance in Corner with Eyes Closed - 1 x daily - 5 x weekly - 1 sets - 3 reps - 20 sec hold Romberg Stance - 1 x daily - 5 x weekly - 1 sets - 3 reps - 20 sec hold     01/26/21 1937  PT Education  Education Details reviewed and added additional comments to HEP to assist with better understanding  Person(s) Educated Patient;Spouse  Methods Explanation;Demonstration;Verbal cues;Handout  Comprehension Verbalized understanding;Returned demonstration;Verbal cues required;Need further instruction           PT Short Term Goals - 01/16/21 1519       PT SHORT TERM GOAL #1   Title Pt will be independent with initial HEP in order to indicate decreased fall risk and improved functional mobility (Target Date: 03/01/2021-reflects 2 week vacation)    Time 4    Period Weeks    Status New    Target Date 03/01/21      PT SHORT TERM GOAL #2   Title Pt will improve 5TSS  to </= 12 secs without UE support and improved midline posture.    Time 4    Period Weeks    Status New      PT SHORT TERM GOAL #3   Title Pt will improve DGI to >/=18/24 in order to indicate dec fall risk    Time 4    Period Weeks    Status New      PT SHORT TERM GOAL #4   Title Pt will ambulate 200' over indoor surfaces with LRAD scanning environment at S level in order to indicate safety in community.    Time 4    Period Weeks    Status New  PT Long Term Goals - 01/16/21 1525       PT LONG TERM GOAL #1   Title Pt will be independent with final HEP in order to indicate dec fall risk and improved functional mobility. (Target Date: 03/31/2021-reflects 2 week vacation)    Time 8    Period Weeks    Status New    Target Date 03/31/21      PT LONG TERM GOAL #2   Title Pt will improve DGI score to </=21/24 (without AD) in order to indicate dec fall risk.    Time 8    Period Weeks    Status New      PT LONG TERM GOAL #3   Title Pt will perform 10 sit<>stand without UE support with midline posture in order to indicate improved strength.    Time 8    Period Weeks    Status New      PT LONG TERM GOAL #4   Title Pt will ambulate 500' over unlevel paved outdoor surfaces w/ LRAD at S level while scanning environment in order to perform community/leisure activity safely.    Time 8    Period Weeks    Status New               01/26/21 1540  Plan  Clinical Impression Statement Today's skilled session initially focused on review of HEP from last session. Pt performed ex's in session with cues for correct form/technique needed. Frequencing updated to 1x a day as spouse feels 2x a day could be too much. Remainder of sesssion continued to address strengthening and balance training with no issues noted or reported in session. The pt is making steady progress toward goals and should benefit from continued PT to progress toward unmet goals.  Personal Factors and  Comorbidities Comorbidity 3+;Age;Time since onset of injury/illness/exacerbation  Comorbidities see above  Examination-Activity Limitations Lift;Locomotion Level;Squat;Stairs;Stand;Transfers  Examination-Participation Restrictions Community Activity;Shop  Pt will benefit from skilled therapeutic intervention in order to improve on the following deficits Abnormal gait;Decreased balance;Decreased knowledge of use of DME;Decreased range of motion;Decreased strength;Impaired flexibility;Impaired sensation;Impaired perceived functional ability;Postural dysfunction  Stability/Clinical Decision Making Evolving/Moderate complexity  Rehab Potential Good  PT Frequency 2x / week  PT Duration 8 weeks  PT Treatment/Interventions ADLs/Self Care Home Management;Aquatic Therapy;DME Instruction;Gait training;Stair training;Functional mobility training;Therapeutic activities;Therapeutic exercise;Balance training;Neuromuscular re-education;Patient/family education;Passive range of motion;Vestibular  PT Next Visit Plan . Gait with cane. Once back continue to work on high level balance, LLE>RLE strength, gait with turns, compliant surfaces. Mets at L3/4 so be careful to protect back.  Consulted and Agree with Plan of Care Patient;Family member/caregiver  Family Member Consulted husband [name]         Patient will benefit from skilled therapeutic intervention in order to improve the following deficits and impairments:  Abnormal gait, Decreased balance, Decreased knowledge of use of DME, Decreased range of motion, Decreased strength, Impaired flexibility, Impaired sensation, Impaired perceived functional ability, Postural dysfunction  Visit Diagnosis: Other abnormalities of gait and mobility  Muscle weakness (generalized)  Unsteadiness on feet     Problem List Patient Active Problem List   Diagnosis Date Noted   Recurrent falls while walking 01/02/2021   H/O laceration of skin 01/02/2021   Cancer  associated pain 11/17/2020   Rash and other nonspecific skin eruption 09/28/2020   Pruritus 09/28/2020   Skin rash 08/28/2020   Leg ulcer ([place]) 01/12/2020   Cellulitis of left leg 12/28/2019   Anemia, chronic disease 12/28/2019  Bilateral edema of lower extremity 12/28/2019   Diarrhea 12/16/2019   Memory impairment 11/29/2019   Malignant cachexia ([place]) 11/08/2019   Deep vein blood clot of right lower extremity ([place]) 11/08/2019   Recurrent epistaxis 11/08/2019   Elevated serum creatinine 08/16/2019   Acquired hypothyroidism 07/26/2019   TIA (transient ischemic attack) 05/25/2019   Goals of care, counseling/discussion 04/19/2019   Dehydration 01/14/2019   Pancytopenia, acquired ([place]) 01/14/2019   Dysuria 12/29/2018   Right lower lobe pulmonary nodule 12/07/2018   Primary lung cancer, right ([place]) 11/04/2018   Essential hypertension 01/24/2017   Genetic testing 10/31/2016   History of breast cancer    Family history of breast cancer    Aortic atherosclerosis ([place]) 06/08/2016   Plantar fasciitis of left foot 12/13/2015   Anemia due to antineoplastic chemotherapy 11/14/2015   Lactose intolerance in adult 11/14/2015   Chemotherapy-induced neuropathy ([place]) 10/08/2015   Dense breast tissue 10/08/2015   Port catheter in place 09/28/2015   Central line complication 13/24/4010   Chemotherapy induced neutropenia ([place]) 08/22/2015   Arterial hypotension 08/12/2015   HX: breast cancer 02/07/2015   Metastatic cancer to bone ([place]) 06/27/2014   Hypotension 06/27/2011   Endometrial cancer ([place]) 05/15/2011   Hypercholesterolemia 12/28/2010   Palpitations 12/28/2010    Willow Ora, PTA 01/29/2021, 7:35 PM  [facility] [address] [place], [place], [address] Phone: [phone]   Fax:  [phone]  Name: [name] MRN: [id number] Date of Birth: 10-02-1940

## 2021-02-12 ENCOUNTER — Inpatient Hospital Stay: Payer: Medicare Other | Attending: Hematology and Oncology | Admitting: Hematology and Oncology

## 2021-02-12 ENCOUNTER — Ambulatory Visit (HOSPITAL_COMMUNITY): Payer: Medicare Other

## 2021-02-12 ENCOUNTER — Emergency Department (HOSPITAL_COMMUNITY): Payer: Medicare Other

## 2021-02-12 ENCOUNTER — Telehealth: Payer: Self-pay | Admitting: Hematology and Oncology

## 2021-02-12 ENCOUNTER — Encounter: Payer: Self-pay | Admitting: Hematology and Oncology

## 2021-02-12 ENCOUNTER — Inpatient Hospital Stay (HOSPITAL_COMMUNITY)
Admission: EM | Admit: 2021-02-12 | Discharge: 2021-02-16 | DRG: 542 | Disposition: A | Discharge status: Home Health | Payer: Medicare Other | Attending: Internal Medicine | Admitting: Internal Medicine

## 2021-02-12 ENCOUNTER — Other Ambulatory Visit: Payer: Self-pay | Admitting: Hematology and Oncology

## 2021-02-12 ENCOUNTER — Telehealth: Payer: Self-pay

## 2021-02-12 ENCOUNTER — Encounter (HOSPITAL_COMMUNITY): Payer: Self-pay

## 2021-02-12 ENCOUNTER — Inpatient Hospital Stay: Payer: Medicare Other

## 2021-02-12 ENCOUNTER — Other Ambulatory Visit: Payer: Self-pay

## 2021-02-12 ENCOUNTER — Encounter (HOSPITAL_COMMUNITY)
Admission: RE | Admit: 2021-02-12 | Discharge: 2021-02-12 | Disposition: A | Discharge status: Home / Self Care | Payer: Medicare Other | Source: Ambulatory Visit | Attending: Hematology and Oncology | Admitting: Hematology and Oncology

## 2021-02-12 DIAGNOSIS — Z7189 Other specified counseling: Secondary | ICD-10-CM

## 2021-02-12 DIAGNOSIS — Z823 Family history of stroke: Secondary | ICD-10-CM

## 2021-02-12 DIAGNOSIS — Z6821 Body mass index (BMI) 21.0-21.9, adult: Secondary | ICD-10-CM

## 2021-02-12 DIAGNOSIS — R197 Diarrhea, unspecified: Secondary | ICD-10-CM

## 2021-02-12 DIAGNOSIS — D638 Anemia in other chronic diseases classified elsewhere: Secondary | ICD-10-CM | POA: Diagnosis present

## 2021-02-12 DIAGNOSIS — E876 Hypokalemia: Secondary | ICD-10-CM | POA: Diagnosis present

## 2021-02-12 DIAGNOSIS — Z8249 Family history of ischemic heart disease and other diseases of the circulatory system: Secondary | ICD-10-CM

## 2021-02-12 DIAGNOSIS — C7951 Secondary malignant neoplasm of bone: Secondary | ICD-10-CM

## 2021-02-12 DIAGNOSIS — D649 Anemia, unspecified: Secondary | ICD-10-CM

## 2021-02-12 DIAGNOSIS — I82401 Acute embolism and thrombosis of unspecified deep veins of right lower extremity: Secondary | ICD-10-CM | POA: Diagnosis not present

## 2021-02-12 DIAGNOSIS — E739 Lactose intolerance, unspecified: Secondary | ICD-10-CM | POA: Diagnosis present

## 2021-02-12 DIAGNOSIS — Z9049 Acquired absence of other specified parts of digestive tract: Secondary | ICD-10-CM

## 2021-02-12 DIAGNOSIS — L89302 Pressure ulcer of unspecified buttock, stage 2: Secondary | ICD-10-CM | POA: Diagnosis present

## 2021-02-12 DIAGNOSIS — G893 Neoplasm related pain (acute) (chronic): Secondary | ICD-10-CM | POA: Diagnosis present

## 2021-02-12 DIAGNOSIS — Z79899 Other long term (current) drug therapy: Secondary | ICD-10-CM

## 2021-02-12 DIAGNOSIS — Z515 Encounter for palliative care: Secondary | ICD-10-CM

## 2021-02-12 DIAGNOSIS — L8990 Pressure ulcer of unspecified site, unspecified stage: Secondary | ICD-10-CM | POA: Insufficient documentation

## 2021-02-12 DIAGNOSIS — S22009A Unspecified fracture of unspecified thoracic vertebra, initial encounter for closed fracture: Secondary | ICD-10-CM | POA: Diagnosis not present

## 2021-02-12 DIAGNOSIS — M4319 Spondylolisthesis, multiple sites in spine: Secondary | ICD-10-CM | POA: Diagnosis not present

## 2021-02-12 DIAGNOSIS — Z86718 Personal history of other venous thrombosis and embolism: Secondary | ICD-10-CM

## 2021-02-12 DIAGNOSIS — E7800 Pure hypercholesterolemia, unspecified: Secondary | ICD-10-CM | POA: Diagnosis present

## 2021-02-12 DIAGNOSIS — Z888 Allergy status to other drugs, medicaments and biological substances status: Secondary | ICD-10-CM

## 2021-02-12 DIAGNOSIS — Z79890 Hormone replacement therapy (postmenopausal): Secondary | ICD-10-CM

## 2021-02-12 DIAGNOSIS — E039 Hypothyroidism, unspecified: Secondary | ICD-10-CM | POA: Diagnosis present

## 2021-02-12 DIAGNOSIS — Z20822 Contact with and (suspected) exposure to covid-19: Secondary | ICD-10-CM | POA: Diagnosis not present

## 2021-02-12 DIAGNOSIS — I679 Cerebrovascular disease, unspecified: Secondary | ICD-10-CM | POA: Diagnosis present

## 2021-02-12 DIAGNOSIS — C541 Malignant neoplasm of endometrium: Secondary | ICD-10-CM

## 2021-02-12 DIAGNOSIS — F0390 Unspecified dementia without behavioral disturbance: Secondary | ICD-10-CM | POA: Diagnosis present

## 2021-02-12 DIAGNOSIS — M5124 Other intervertebral disc displacement, thoracic region: Secondary | ICD-10-CM | POA: Diagnosis not present

## 2021-02-12 DIAGNOSIS — G9520 Unspecified cord compression: Secondary | ICD-10-CM | POA: Diagnosis not present

## 2021-02-12 DIAGNOSIS — M47814 Spondylosis without myelopathy or radiculopathy, thoracic region: Secondary | ICD-10-CM | POA: Diagnosis not present

## 2021-02-12 DIAGNOSIS — Z85118 Personal history of other malignant neoplasm of bronchus and lung: Secondary | ICD-10-CM

## 2021-02-12 DIAGNOSIS — I700 Atherosclerosis of aorta: Secondary | ICD-10-CM | POA: Diagnosis present

## 2021-02-12 DIAGNOSIS — Z885 Allergy status to narcotic agent status: Secondary | ICD-10-CM

## 2021-02-12 DIAGNOSIS — Z923 Personal history of irradiation: Secondary | ICD-10-CM

## 2021-02-12 DIAGNOSIS — Z803 Family history of malignant neoplasm of breast: Secondary | ICD-10-CM

## 2021-02-12 DIAGNOSIS — S14129A Central cord syndrome at unspecified level of cervical spinal cord, initial encounter: Secondary | ICD-10-CM | POA: Diagnosis present

## 2021-02-12 DIAGNOSIS — R296 Repeated falls: Secondary | ICD-10-CM | POA: Diagnosis not present

## 2021-02-12 DIAGNOSIS — K8020 Calculus of gallbladder without cholecystitis without obstruction: Secondary | ICD-10-CM | POA: Diagnosis not present

## 2021-02-12 DIAGNOSIS — E43 Unspecified severe protein-calorie malnutrition: Secondary | ICD-10-CM

## 2021-02-12 DIAGNOSIS — Z7901 Long term (current) use of anticoagulants: Secondary | ICD-10-CM

## 2021-02-12 DIAGNOSIS — R64 Cachexia: Secondary | ICD-10-CM

## 2021-02-12 DIAGNOSIS — D630 Anemia in neoplastic disease: Secondary | ICD-10-CM | POA: Diagnosis present

## 2021-02-12 DIAGNOSIS — C3491 Malignant neoplasm of unspecified part of right bronchus or lung: Secondary | ICD-10-CM | POA: Insufficient documentation

## 2021-02-12 DIAGNOSIS — M5416 Radiculopathy, lumbar region: Secondary | ICD-10-CM | POA: Diagnosis present

## 2021-02-12 DIAGNOSIS — I10 Essential (primary) hypertension: Secondary | ICD-10-CM | POA: Diagnosis not present

## 2021-02-12 DIAGNOSIS — M5021 Other cervical disc displacement,  high cervical region: Secondary | ICD-10-CM | POA: Diagnosis not present

## 2021-02-12 DIAGNOSIS — Z853 Personal history of malignant neoplasm of breast: Secondary | ICD-10-CM

## 2021-02-12 DIAGNOSIS — I2510 Atherosclerotic heart disease of native coronary artery without angina pectoris: Secondary | ICD-10-CM | POA: Diagnosis not present

## 2021-02-12 DIAGNOSIS — Z8673 Personal history of transient ischemic attack (TIA), and cerebral infarction without residual deficits: Secondary | ICD-10-CM

## 2021-02-12 DIAGNOSIS — Z90710 Acquired absence of both cervix and uterus: Secondary | ICD-10-CM

## 2021-02-12 DIAGNOSIS — Z87891 Personal history of nicotine dependence: Secondary | ICD-10-CM

## 2021-02-12 DIAGNOSIS — M419 Scoliosis, unspecified: Secondary | ICD-10-CM | POA: Diagnosis not present

## 2021-02-12 DIAGNOSIS — K5730 Diverticulosis of large intestine without perforation or abscess without bleeding: Secondary | ICD-10-CM | POA: Diagnosis not present

## 2021-02-12 DIAGNOSIS — Z8542 Personal history of malignant neoplasm of other parts of uterus: Secondary | ICD-10-CM

## 2021-02-12 LAB — CBC WITH DIFFERENTIAL (CANCER CENTER ONLY)
Abs Immature Granulocytes: 0.03 10*3/uL (ref 0.00–0.07)
Basophils Absolute: 0 10*3/uL (ref 0.0–0.1)
Basophils Relative: 1 %
Eosinophils Absolute: 0 10*3/uL (ref 0.0–0.5)
Eosinophils Relative: 0 %
HCT: 39.5 % (ref 36.0–46.0)
Hemoglobin: 13.1 g/dL (ref 12.0–15.0)
Immature Granulocytes: 0 %
Lymphocytes Relative: 14 %
Lymphs Abs: 1 10*3/uL (ref 0.7–4.0)
MCH: 32.1 pg (ref 26.0–34.0)
MCHC: 33.2 g/dL (ref 30.0–36.0)
MCV: 96.8 fL (ref 80.0–100.0)
Monocytes Absolute: 1 10*3/uL (ref 0.1–1.0)
Monocytes Relative: 13 %
Neutro Abs: 5.3 10*3/uL (ref 1.7–7.7)
Neutrophils Relative %: 72 %
Platelet Count: 193 10*3/uL (ref 150–400)
RBC: 4.08 MIL/uL (ref 3.87–5.11)
RDW: 14 % (ref 11.5–15.5)
WBC Count: 7.4 10*3/uL (ref 4.0–10.5)
nRBC: 0 % (ref 0.0–0.2)

## 2021-02-12 LAB — COMPREHENSIVE METABOLIC PANEL
ALT: 28 U/L (ref 0–44)
AST: 39 U/L (ref 15–41)
Albumin: 3.6 g/dL (ref 3.5–5.0)
Alkaline Phosphatase: 43 U/L (ref 38–126)
Anion gap: 13 (ref 5–15)
BUN: 20 mg/dL (ref 8–23)
CO2: 24 mmol/L (ref 22–32)
Calcium: 9.2 mg/dL (ref 8.9–10.3)
Chloride: 101 mmol/L (ref 98–111)
Creatinine, Ser: 0.91 mg/dL (ref 0.44–1.00)
GFR, Estimated: >60 mL/min (ref >60)
Glucose, Bld: 94 mg/dL (ref 70–99)
Potassium: 3.4 mmol/L — ABNORMAL LOW (ref 3.5–5.1)
Sodium: 138 mmol/L (ref 135–145)
Total Bilirubin: 1.1 mg/dL (ref 0.3–1.2)
Total Protein: 7 g/dL (ref 6.5–8.1)

## 2021-02-12 LAB — RESP PANEL BY RT-PCR (FLU A&B, COVID) ARPGX2
Influenza A by PCR: NEGATIVE
Influenza B by PCR: NEGATIVE
SARS Coronavirus 2 by RT PCR: NEGATIVE

## 2021-02-12 LAB — MAGNESIUM: Magnesium: 1.5 mg/dL — ABNORMAL LOW (ref 1.7–2.4)

## 2021-02-12 LAB — GLUCOSE, CAPILLARY: Glucose-Capillary: 105 mg/dL — ABNORMAL HIGH (ref 70–99)

## 2021-02-12 MED ORDER — MAGNESIUM SULFATE 50 % IJ SOLN: 2.0000 g | Freq: Once | INTRAMUSCULAR | Status: DC

## 2021-02-12 MED ORDER — LIDOCAINE-PRILOCAINE 2.5-2.5 % EX CREA: 1.0000 "application " | TOPICAL_CREAM | Freq: Every day | CUTANEOUS | Status: DC | PRN

## 2021-02-12 MED ORDER — LEVOTHYROXINE SODIUM 50 MCG PO TABS
50.0000 ug | ORAL_TABLET | Freq: Every day | ORAL | Status: DC
  Administered 2021-02-13 – 2021-02-16 (×4): 50 ug via ORAL
  Filled 2021-02-12 (×4): qty 1

## 2021-02-12 MED ORDER — VITAMIN D 25 MCG (1000 UNIT) PO TABS
1000.0000 [IU] | ORAL_TABLET | Freq: Every morning | ORAL | Status: DC
  Administered 2021-02-13 – 2021-02-16 (×4): 1000 [IU] via ORAL
  Filled 2021-02-12 (×2): qty 1

## 2021-02-12 MED ORDER — ENOXAPARIN SODIUM 40 MG/0.4ML IJ SOSY: 40.0000 mg | PREFILLED_SYRINGE | INTRAMUSCULAR | Status: DC

## 2021-02-12 MED ORDER — DONEPEZIL HCL 10 MG PO TABS
5.0000 mg | ORAL_TABLET | Freq: Every day | ORAL | Status: DC
  Administered 2021-02-12 – 2021-02-15 (×4): 5 mg via ORAL
  Filled 2021-02-12 (×4): qty 1

## 2021-02-12 MED ORDER — OXYCODONE-ACETAMINOPHEN 5-325 MG PO TABS
2.0000 | ORAL_TABLET | Freq: Once | ORAL | Status: AC
  Administered 2021-02-12: 2 via ORAL
  Filled 2021-02-12: qty 2

## 2021-02-12 MED ORDER — DEXAMETHASONE SODIUM PHOSPHATE 4 MG/ML IJ SOLN
4.0000 mg | Freq: Once | INTRAMUSCULAR | Status: AC
  Administered 2021-02-12: 4 mg via INTRAVENOUS
  Filled 2021-02-12: qty 1

## 2021-02-12 MED ORDER — CYANOCOBALAMIN 500 MCG PO TABS
500.0000 ug | ORAL_TABLET | Freq: Every morning | ORAL | Status: DC
  Administered 2021-02-13 – 2021-02-16 (×4): 500 ug via ORAL
  Filled 2021-02-12 (×4): qty 1

## 2021-02-12 MED ORDER — POTASSIUM CHLORIDE CRYS ER 20 MEQ PO TBCR
40.0000 meq | EXTENDED_RELEASE_TABLET | Freq: Once | ORAL | Status: DC
  Filled 2021-02-12: qty 2

## 2021-02-12 MED ORDER — ONDANSETRON HCL 4 MG/2ML IJ SOLN: 4.0000 mg | Freq: Four times a day (QID) | INTRAMUSCULAR | Status: DC | PRN

## 2021-02-12 MED ORDER — LORATADINE 10 MG PO TABS
10.0000 mg | ORAL_TABLET | Freq: Every day | ORAL | Status: DC
  Administered 2021-02-13 – 2021-02-16 (×4): 10 mg via ORAL
  Filled 2021-02-12 (×4): qty 1

## 2021-02-12 MED ORDER — HYDRALAZINE HCL 50 MG PO TABS
50.0000 mg | ORAL_TABLET | Freq: Two times a day (BID) | ORAL | Status: DC
  Administered 2021-02-12 – 2021-02-16 (×8): 50 mg via ORAL
  Filled 2021-02-12 (×7): qty 1
  Filled 2021-02-12: qty 2

## 2021-02-12 MED ORDER — SODIUM CHLORIDE 0.9 % IV SOLN: Freq: Once | INTRAVENOUS | Status: AC

## 2021-02-12 MED ORDER — RIVAROXABAN 10 MG PO TABS
10.0000 mg | ORAL_TABLET | Freq: Every day | ORAL | Status: DC
  Administered 2021-02-13 – 2021-02-16 (×4): 10 mg via ORAL
  Filled 2021-02-12 (×4): qty 1

## 2021-02-12 MED ORDER — POTASSIUM CHLORIDE 10 MEQ/100ML IV SOLN
10.0000 meq | INTRAVENOUS | Status: DC
Start: 2021-02-12 — End: 2021-02-12
  Filled 2021-02-12: qty 100

## 2021-02-12 MED ORDER — GADOBUTROL 1 MMOL/ML IV SOLN
5.0000 mL | Freq: Once | INTRAVENOUS | Status: AC | PRN
  Administered 2021-02-12: 5 mL via INTRAVENOUS

## 2021-02-12 MED ORDER — POTASSIUM 99 MG PO TABS: 99.0000 mg | ORAL_TABLET | Freq: Every morning | ORAL | Status: DC

## 2021-02-12 MED ORDER — MAGNESIUM SULFATE 2 GM/50ML IV SOLN
2.0000 g | Freq: Once | INTRAVENOUS | Status: AC
  Administered 2021-02-12: 2 g via INTRAVENOUS
  Filled 2021-02-12: qty 50

## 2021-02-12 MED ORDER — POTASSIUM CHLORIDE CRYS ER 20 MEQ PO TBCR
20.0000 meq | EXTENDED_RELEASE_TABLET | Freq: Once | ORAL | Status: AC
  Administered 2021-02-12: 20 meq via ORAL
  Filled 2021-02-12: qty 1

## 2021-02-12 MED ORDER — FLUDEOXYGLUCOSE F - 18 (FDG) INJECTION
5.6000 | Freq: Once | INTRAVENOUS | Status: AC | PRN
  Administered 2021-02-12: 5.39 via INTRAVENOUS

## 2021-02-12 MED ORDER — ADULT MULTIVITAMIN W/MINERALS CH
1.0000 | ORAL_TABLET | Freq: Every morning | ORAL | Status: DC
  Administered 2021-02-13 – 2021-02-16 (×4): 1 via ORAL
  Filled 2021-02-12 (×2): qty 1

## 2021-02-12 MED ORDER — SIMVASTATIN 20 MG PO TABS
20.0000 mg | ORAL_TABLET | Freq: Every evening | ORAL | Status: DC
  Administered 2021-02-12 – 2021-02-15 (×4): 20 mg via ORAL
  Filled 2021-02-12 (×4): qty 1

## 2021-02-12 MED ORDER — OXYCODONE HCL 5 MG PO TABS
10.0000 mg | ORAL_TABLET | ORAL | Status: DC | PRN
  Administered 2021-02-14: 5 mg via ORAL
  Filled 2021-02-12: qty 2

## 2021-02-12 MED ORDER — DEXAMETHASONE SODIUM PHOSPHATE 4 MG/ML IJ SOLN
4.0000 mg | Freq: Three times a day (TID) | INTRAMUSCULAR | Status: DC
  Administered 2021-02-13: 4 mg via INTRAVENOUS
  Filled 2021-02-12: qty 1

## 2021-02-12 MED ORDER — ATENOLOL 25 MG PO TABS
25.0000 mg | ORAL_TABLET | Freq: Every morning | ORAL | Status: DC
  Administered 2021-02-13 – 2021-02-16 (×4): 25 mg via ORAL
  Filled 2021-02-12 (×2): qty 1

## 2021-02-12 MED ORDER — ONDANSETRON HCL 4 MG PO TABS: 4.0000 mg | ORAL_TABLET | Freq: Four times a day (QID) | ORAL | Status: DC | PRN

## 2021-02-12 MED ORDER — MEMANTINE HCL 10 MG PO TABS
10.0000 mg | ORAL_TABLET | Freq: Two times a day (BID) | ORAL | Status: DC
  Administered 2021-02-12 – 2021-02-16 (×8): 10 mg via ORAL
  Filled 2021-02-12: qty 2
  Filled 2021-02-12 (×7): qty 1

## 2021-02-12 MED ORDER — OXYCODONE-ACETAMINOPHEN 5-325 MG PO TABS: 1.0000 | ORAL_TABLET | ORAL | Status: DC | PRN

## 2021-02-12 NOTE — Telephone Encounter (Signed)
Looks like she has checked in to PET Can you go over and tell her husband I will see her after PET CT scan? Schedule her appt at 330 pm today

## 2021-02-12 NOTE — ED Notes (Signed)
Patient transported to MRI 

## 2021-02-12 NOTE — Telephone Encounter (Signed)
Added appt at 3:30 pm today with Dr. Alvy Bimler. Husband aware of appt.

## 2021-02-12 NOTE — H&P (Signed)
History and Physical    [name] QBH:[id number] DOB: 1940-09-01 DOA: 02/12/2021  PCP: Shon Baton, MD  Patient coming from: Home  I have personally briefly reviewed patient's old medical records in [place]  Chief Complaint: Leg weakness  HPI: [name] is a 80 y.o. female with medical history significant of HTN, endometrial CA with met to lumbar spine.  Pt on PO chemotherapy.  Unfortunately recent PET scan shows cancer getting worse and lumbar spinal met now likely causing cord compression.  Pt seen in office today by Dr. Alvy Bimler: Pt definitely doing worse clinically, pt having worsening LLE weakness (consistent with nerve compression from the spinal met).  Metabolic abnormalities on labs drawn in office.  Pt sent in to ED.  No fevers, chills.  Pain when trying to lift leg.  Having to use a walker in past few days.  Pain not controlled even with oxy 10.  ED Course: MRI confirms spinal cord / nerve root compression in L spine by Tumor.  Metabolic abnormalities seen in office are mostly a lab error however (suspect sample was diluted with NS).   Review of Systems: As per HPI, otherwise all review of systems negative.  Past Medical History:  Diagnosis Date   Eczema    Endometrial cancer ([place]) 12/2007   s/p total abdominal hysterectomy at [facility] - ovaries were also removed    Family history of breast cancer    History of breast cancer    right breast -- treated with lumpectomy and radiation therarpy, postoperatively with tamoxifen    History of radiation therapy 10/22/2016 to 10/28/2016   Hypercholesterolemia    Hyperlipidemia    Hypertension    Palpitations    Personal history of radiation therapy 2006   PVC's (premature ventricular contractions)    Radiation 07/11/14-08/12/14   left lumbar paraspinal area 55 gray   S/P radiation therapy 10/28/11-12/05/11   5040 cGy left pelvis   S/P radiation therapy    Intracavitary brachytherapy of uterus   Urticaria      Past Surgical History:  Procedure Laterality Date   ADENOIDECTOMY     APPENDECTOMY     BREAST LUMPECTOMY Right 2000   COLONOSCOPY     3X   FOOT SURGERY     RIGHT   SEGMENTECOMY Right 12/07/2018   Procedure: RIGHT LOWER LOBE SUPERIOR SEGMENTECTOMY;  Surgeon: Melrose Nakayama, MD;  Location: [facility];  Service: Thoracic;  Laterality: Right;   TONSILLECTOMY     TONSILLECTOMY     TOTAL ABDOMINAL HYSTERECTOMY W/ BILATERAL SALPINGOOPHORECTOMY     VIDEO ASSISTED THORACOSCOPY Right 12/07/2018   Procedure: VIDEO ASSISTED THORACOSCOPY;  Surgeon: Melrose Nakayama, MD;  Location: [facility];  Service: Thoracic;  Laterality: Right;     reports that she quit smoking about 58 years ago. Her smoking use included cigarettes. She has never used smokeless tobacco. She reports current alcohol use. She reports that she does not use drugs.  Allergies  Allergen Reactions   Ace Inhibitors Anaphylaxis    Shortness of breath    Dilaudid [Hydromorphone Hcl] Other (See Comments)    "total loss of her mind"   Codeine Nausea Only    Family History  Problem Relation Age of Onset   Thrombosis Father        coronary thrombosis   Hypertension Father    Heart failure Mother    Asthma Mother    Coronary artery disease Brother    Breast cancer Maternal Aunt  dx in her 29s   Stroke Maternal Uncle    Stroke Maternal Grandfather    Allergic rhinitis Neg Hx    Eczema Neg Hx    Urticaria Neg Hx      Prior to Admission medications   Medication Sig Start Date End Date Taking? Authorizing Provider  atenolol (TENORMIN) 25 MG tablet Take 25 mg by mouth every morning. 01/08/11  Yes Darlin Coco, MD  BIOTIN PO Take 500 mcg by mouth every morning.   Yes [provider]  cetirizine (ZYRTEC) 10 MG tablet Take 10 mg by mouth every morning.   Yes [provider]  cholecalciferol (VITAMIN D3) 25 MCG (1000 UNIT) tablet Take 1,000 Units by mouth every morning.   Yes [provider]  Cyanocobalamin (VITAMIN B 12) 500 MCG TABS Take 500 mcg by mouth every morning.   Yes [provider]  donepezil (ARICEPT) 5 MG tablet TAKE 1 TABLET BY MOUTH EVERYDAY AT BEDTIME Patient taking differently: Take 5 mg by mouth at bedtime. 12/04/20  Yes Suzzanne Cloud, NP  furosemide (LASIX) 20 MG tablet Take 1 tablet (20 mg total) by mouth daily. 01/02/20  Yes Virgel Manifold, MD  hydrALAZINE (APRESOLINE) 25 MG tablet Take 25 mg by mouth See admin instructions. Take one tablet (25 mg) by mouth at 3pm for DBP >80 10/16/20  Yes [provider]  hydrALAZINE (APRESOLINE) 50 MG tablet Take 50 mg by mouth 2 (two) times daily. 10/18/20  Yes [provider]  ibuprofen (ADVIL) 800 MG tablet Take 800 mg by mouth every 8 (eight) hours as needed for headache (pain).   Yes [provider]  lenvatinib 10 mg daily dose (LENVIMA) capsule Take 10 mg by mouth every morning.   Yes [provider]  levothyroxine (SYNTHROID) 50 MCG tablet TAKE 1 TABLET BY MOUTH DAILY BEFORE BREAKFAST Patient taking differently: Take 50 mcg by mouth daily before breakfast. 01/25/21  Yes Gorsuch, Ni, MD  lidocaine-prilocaine (EMLA) cream Apply 1 application topically daily as needed (prior to port access). 01/22/21  Yes [provider]  memantine (NAMENDA) 5 MG tablet TAKE 2 TABLETS BY MOUTH TWICE A DAY Patient taking differently: Take 10 mg by mouth 2 (two) times daily. 10/19/20  Yes Suzzanne Cloud, NP  mometasone (ELOCON) 0.1 % ointment Apply 1 application topically daily as needed (rash/itching). 01/20/21  Yes [provider]  Multiple Vitamin (MULTIVITAMIN WITH MINERALS) TABS tablet Take 1 tablet by mouth every morning.   Yes [provider]  ondansetron (ZOFRAN) 8 MG tablet Take 8 mg by mouth 2 (two) times daily as needed for nausea or vomiting. 01/20/21  Yes [provider]  oxyCODONE (OXY IR/ROXICODONE) 5 MG immediate release tablet Take 1 tablet (5  mg total) by mouth every 6 (six) hours as needed for severe pain. 01/23/21  Yes Gorsuch, Ni, MD  Potassium 99 MG TABS Take 99 mg by mouth every morning.   Yes [provider]  rivaroxaban (XARELTO) 10 MG TABS tablet Take 1 tablet (10 mg total) by mouth daily. 11/08/19  Yes Gorsuch, Ni, MD  simvastatin (ZOCOR) 20 MG tablet Take 20 mg by mouth every evening.    Yes [provider]    Physical Exam: Vitals:   02/12/21 1704 02/12/21 2045 02/12/21 2100 02/12/21 2134  BP: (!) 189/100 (!) 170/147 (!) 162/89 (!) 168/82  Pulse: 77 78 76 72  Resp: 16 18 (!) 23 16  Temp: 98.9 F (37.2 C)     TempSrc:  Oral     SpO2: 98% 98% 98% 98%    Constitutional: NAD, calm, comfortable Eyes: PERRL, lids and conjunctivae normal ENMT: Mucous membranes are moist. Posterior pharynx clear of any exudate or lesions.Normal dentition.  Neck: normal, supple, no masses, no thyromegaly Respiratory: clear to auscultation bilaterally, no wheezing, no crackles. Normal respiratory effort. No accessory muscle use.  Cardiovascular: Regular rate and rhythm, no murmurs / rubs / gallops. No extremity edema. 2+ pedal pulses. No carotid bruits.  Abdomen: no tenderness, no masses palpated. No hepatosplenomegaly. Bowel sounds positive.  Musculoskeletal: TTP lower back Skin: no rashes, lesions, ulcers. No induration Neurologic: Able to stand and pivot from wheelchair, unable to lift L leg off bed more than a few CM.  Normal dorsi and plantar flexion.  RLE 5/5 strength and intact sensation. Psychiatric: Normal judgment and insight. Alert and oriented x 3. Normal mood.    Labs on Admission: I have personally reviewed following labs and imaging studies  CBC: Recent Labs  Lab 02/12/21 1616  WBC 7.4  NEUTROABS 5.3  HGB 13.1  HCT 39.5  MCV 96.8  PLT 497   Basic Metabolic Panel: Recent Labs  Lab 02/12/21 1616 02/12/21 2108 02/12/21 2149  NA 144  --  138  K 2.4*  --  3.4*  CL 117*  --  101  CO2 17*  --   24  GLUCOSE 80  --  94  BUN 14  --  20  CREATININE 0.59  --  0.91  CALCIUM 6.2*  --  9.2  MG  --  1.5*  --    GFR: Estimated Creatinine Clearance: 33.1 mL/min (by C-G formula based on SCr of 0.91 mg/dL). Liver Function Tests: Recent Labs  Lab 02/12/21 1616 02/12/21 2149  AST 25 39  ALT 20 28  ALKPHOS 35* 43  BILITOT 0.5 1.1  PROT 4.5* 7.0  ALBUMIN 2.2* 3.6   No results for input(s): LIPASE, AMYLASE in the last 168 hours. No results for input(s): AMMONIA in the last 168 hours. Coagulation Profile: No results for input(s): INR, PROTIME in the last 168 hours. Cardiac Enzymes: No results for input(s): CKTOTAL, CKMB, CKMBINDEX, TROPONINI in the last 168 hours. BNP (last 3 results) No results for input(s): PROBNP in the last 8760 hours. HbA1C: No results for input(s): HGBA1C in the last 72 hours. CBG: Recent Labs  Lab 02/12/21 1354  GLUCAP 105*   Lipid Profile: No results for input(s): CHOL, HDL, LDLCALC, TRIG, CHOLHDL, LDLDIRECT in the last 72 hours. Thyroid Function Tests: No results for input(s): TSH, T4TOTAL, FREET4, T3FREE, THYROIDAB in the last 72 hours. Anemia Panel: No results for input(s): VITAMINB12, FOLATE, FERRITIN, TIBC, IRON, RETICCTPCT in the last 72 hours. Urine analysis:    Component Value Date/Time   COLORURINE YELLOW 07/26/2019 0843   APPEARANCEUR CLEAR 07/26/2019 0843   LABSPEC 1.013 07/26/2019 0843   LABSPEC 1.020 05/18/2013 0949   PHURINE 5.0 07/26/2019 0843   GLUCOSEU NEGATIVE 07/26/2019 0843   GLUCOSEU Negative 05/18/2013 0949   HGBUR SMALL (A) 07/26/2019 0843   BILIRUBINUR NEGATIVE 07/26/2019 0843   BILIRUBINUR Negative 05/18/2013 0949   KETONESUR NEGATIVE 07/26/2019 0843   PROTEINUR 30 (A) 01/23/2021 0955   UROBILINOGEN 0.2 05/18/2013 0949   NITRITE NEGATIVE 07/26/2019 0843   LEUKOCYTESUR TRACE (A) 07/26/2019 0843   LEUKOCYTESUR Large 05/18/2013 0949    Radiological Exams on Admission: MR Cervical Spine W or Wo Contrast  Result  Date: 02/12/2021 CLINICAL DATA:  Initial evaluation for endometrial carcinoma, metastatic evaluation.  Frequent falls. EXAM: MRI CERVICAL SPINE WITHOUT AND WITH CONTRAST TECHNIQUE: Multiplanar and multiecho pulse sequences of the cervical spine, to include the craniocervical junction and cervicothoracic junction, were obtained without and with intravenous contrast. CONTRAST:  67m GADAVIST GADOBUTROL 1 MMOL/ML IV SOLN COMPARISON:  Comparison made with PET-CT performed earlier the same day. FINDINGS: Alignment: Straightening of the normal cervical lordosis. Grade 1 anterolisthesis of C2 on C3 and C7 on T1, chronic and degenerative. Vertebrae: Vertebral body height maintained without acute or chronic fracture. Bone marrow signal intensity within normal limits. No discrete or worrisome osseous lesions. No evidence for osseous metastatic disease within the cervical spine. No evidence for osteomyelitis discitis or septic arthritis. Mild reactive marrow edema about the left C2-3 facet due to facet arthritis. Cord: Normal signal and morphology. No epidural or intradural tumor. No abnormal enhancement. Posterior Fossa, vertebral arteries, paraspinal tissues: T2/STIR signal abnormality within the visualized pons, likely related to chronic microvascular ischemic disease. Visualized brain and posterior fossa otherwise unremarkable. Craniocervical junction normal. Paraspinous and prevertebral soft tissues within normal limits. Normal flow voids seen within the vertebral arteries bilaterally. 5 mm right thyroid nodule noted, of doubtful significance given size and patient age, no follow-up imaging recommended (ref: J Am Coll Radiol. 2015 Feb;12(2): 143-50). Disc levels: C2-C3: Anterolisthesis. Mild disc bulge with prominent left-sided facet degeneration. No spinal stenosis. Foramina remain patent. C3-C4: Degenerative intervertebral disc space narrowing with diffuse disc osteophyte complex. Flattening of the ventral thecal sac with  resultant mild spinal stenosis. Moderate right C4 foraminal narrowing. Left neural foramina remains patent. C4-C5: Degenerative intervertebral disc space narrowing with diffuse disc osteophyte complex. Broad posterior component flattens and partially faces the ventral thecal sac. Superimposed facet and ligament flavum hypertrophy. Resultant moderate spinal stenosis with minimal cord flattening, but no cord signal changes. Moderate right C5 foraminal narrowing. Left neural foramina remains patent. C5-C6: Degenerative intervertebral disc space narrowing with diffuse disc osteophyte complex. Broad posterior component flattens and effaces the ventral thecal sac, asymmetric to the right. Resultant moderate spinal stenosis with mild cord flattening, worse on the right. No cord signal changes. Severe right with moderate left C6 foraminal narrowing. C6-C7: Degenerative intervertebral disc space narrowing with diffuse disc osteophyte complex. Flattening and partial effacement of the ventral thecal sac with resultant mild spinal stenosis. Moderate bilateral C7 foraminal narrowing. C7-T1: Anterolisthesis with mild disc bulge. Moderate bilateral facet arthrosis. No significant spinal stenosis. Foramina remain patent. IMPRESSION: 1. No evidence for metastatic disease within the cervical spine. 2. Moderate multilevel cervical spondylosis with resultant mild to moderate diffuse spinal stenosis at C3-4 through C6-7. 3. Multifactorial degenerative changes with resultant multilevel foraminal narrowing as above. Notable findings include moderate right C4 and C5 foraminal narrowing, severe right with moderate left C6 foraminal stenosis, with moderate bilateral C7 foraminal narrowing. Electronically Signed   By: BJeannine BogaM.D.   On: 02/12/2021 20:43   MR THORACIC SPINE W WO CONTRAST  Result Date: 02/12/2021 CLINICAL DATA:  Initial evaluation for endometrial carcinoma, metastatic evaluation. Frequent falls. EXAM: MRI  THORACIC WITHOUT AND WITH CONTRAST TECHNIQUE: Multiplanar and multiecho pulse sequences of the thoracic spine were obtained without and with intravenous contrast. CONTRAST:  564mGADAVIST GADOBUTROL 1 MMOL/ML IV SOLN COMPARISON:  PET-CT from earlier the same day. FINDINGS: Alignment: Thoracolumbar scoliosis partially visualized. Trace degenerative anterolisthesis of T2 on T3. Vertebrae: Vertebral body height maintained without acute or chronic fracture. Bone marrow signal intensity diffusely heterogeneous. Few scattered benign hemangiomata noted, most prominent of which measures 1.5 cm within  the T8 vertebral body. No worrisome osseous lesions. No evidence for osseous metastatic disease. No evidence for osteomyelitis discitis or septic arthritis. Cord: Normal signal and morphology. No epidural or intradural tumor. No abnormal enhancement. Paraspinal and other soft tissues: Paraspinous soft tissues demonstrate no acute or significant finding. Partially visualized lungs are grossly clear. 5 mm right thyroid nodule partially visualized, of doubtful significance given size and patient age, no follow-up imaging recommended (ref: J Am Coll Radiol. 2015 Feb;12(2): 143-50).Remainder the visualized visceral structures demonstrate no other significant finding. Disc levels: T9-10: Right paracentral disc protrusion indents the right ventral thecal sac (series 26, image 12). Mild flattening of the right ventral cord without cord signal changes or significant spinal stenosis. Otherwise, ordinary for age degenerative disc disease and facet hypertrophy seen elsewhere throughout the thoracic spine. No other significant spinal stenosis. Foramina remain patent. IMPRESSION: 1. No evidence for metastatic disease within the thoracic spine. 2. Right paracentral disc protrusion at T9-10 with mild flattening of the right hemi cord. No cord signal changes or significant stenosis. Electronically Signed   By: Jeannine Boga M.D.   On:  02/12/2021 21:20   MR Lumbar Spine W Wo Contrast  Result Date: 02/12/2021 CLINICAL DATA:  Initial evaluation for history of endometrial carcinoma, metastatic evaluation, frequent falls. EXAM: MRI LUMBAR SPINE WITHOUT AND WITH CONTRAST TECHNIQUE: Multiplanar and multiecho pulse sequences of the lumbar spine were obtained without and with intravenous contrast. CONTRAST:  21m GADAVIST GADOBUTROL 1 MMOL/ML IV SOLN COMPARISON:  Prior PET-CT from earlier the same day as well as previous MRI from 11/25/2020. FINDINGS: Segmentation: Standard. Lowest well-formed disc space labeled the L5-S1 level. Alignment: Advanced sigmoid scoliotic curvature of the thoracolumbar spine. 6 mm anterolisthesis of L4 on L5, with trace retrolisthesis of T12 on L1 and L3 on L4. Appearance is stable. Vertebrae: Again seen is abnormal T1 hypointense enhancing lesion involving the left aspect of the L3 and L4 vertebral bodies, concerning for malignancy. Extension to involve the left pedicles and posterior elements of L3 and L4, similar to perhaps mildly progressed. Additionally, abnormal T1 hypointensity now seen extending to involve the central and right aspect of the L3 and L4 vertebral bodies, increased from prior, and suggesting disease progression. Extraosseous extension with tumor seen obliterating the left L3-4 neural foramen, with extension to partially involve the left L4-5 neural foramen as well (series 1, images 11, 10). Probable early extension superiorly towards the left L2-3 neural foramen as well (series 1, image 13). Evidence for epidural involvement along the left and ventral aspect of the thecal sac extending from L3-4 through L4-5 (series 7, images 25-30). This appears slightly more prominent as compared to previous, with possible intradural extension at the level of L4-5 (series 7, image 29). Additionally, soft tissue density within the left ventral epidural space posterior to L5 is new from prior, and concerning for  progressive epidural tumor (series 7, image 31). Soft tissue mass involving the left paraspinous soft tissues at the level of L3-4 again seen, slightly increased in size measuring 2.7 x 1.7 cm (series 7, image 25, previously 2.3 x 1.5 cm when measured in a similar fashion on prior exam). Associated edema and enhancement within the adjacent left psoas muscle without frank soft tissue lesion. Vertebral body height maintained without interval pathologic fracture. Reactive edema about the T12-L1 interspace favored to be degenerative in nature, similar to prior. No other distant sites of osseous metastatic disease identified. Probable postradiation changes noted elsewhere within the lumbar spine. Conus medullaris and  cauda equina: Conus extends to the L1-2 level. Conus itself appears normal. Question intradural extension of tumor with involvement of the left-sided nerve roots of the cauda equina at L4-5 (series 7, image 28). Paraspinal and other soft tissues: Slightly increased size of left-sided paraspinous soft tissue mass at L3-4 as above. Associated edema and enhancement within the adjacent left psoas muscle Tiny subcentimeter simple cyst noted within the right kidney. Probable cholelithiasis noted within the partially visualized gallbladder. Disc levels: T12-L1: Degenerative intervertebral disc space narrowing with disc bulge and reactive endplate spurring, greater on the right. Bilateral facet hypertrophy. No spinal stenosis. Mild right foraminal narrowing. Left neural foramina remains patent. L1-2: Degenerative intervertebral disc space narrowing with disc desiccation and diffuse disc bulge, asymmetric to the right. Associated right-sided reactive endplate change. Moderate right worse than left facet hypertrophy. Resultant moderate narrowing of the right lateral recess with mild-to-moderate right foraminal narrowing. Central canal and left neural foramina remain patent. L2-3: Degenerative intervertebral disc space  narrowing with diffuse disc bulge and disc desiccation. Associated reactive endplate change, greater on the right. Moderate bilateral facet arthrosis. No significant spinal stenosis. Mild right L2 foraminal narrowing. L3-4: Left eccentric disc bulge with reactive endplate change. Moderate left worse than right facet arthrosis. Superimposed tumor obliterates the left neural foramen as above. Resultant severe left lateral recess stenosis and left foraminal narrowing. Mild-to-moderate right foraminal narrowing. L4-5: Anterolisthesis with broad posterior pseudo disc bulge and severe facet arthrosis. New soft tissue density within the left ventral epidural space concerning for possible tumor (series 4, image 31). Resultant moderate canal and right lateral recess stenosis, with progressive severe narrowing of the left lateral recess. Mild right with severe left L4 foraminal stenosis. L5-S1: Disc desiccation with minimal annular bulge. Severe right with moderate left facet arthrosis. No significant spinal stenosis. Foramina remain patent. IMPRESSION: 1. Persistent T1 hypointense enhancing lesion involving the left aspects of the L3 and L4 vertebral bodies, with progressive involvement of the central and right aspects of both vertebral bodies since prior, consistent with progressive disease. No pathologic fracture. 2. Interval increase in size of left paraspinal soft tissue mass at the level of L3-4, with involvement of the adjacent left L3-4 and L4-5 neural foramina. Epidural involvement involving the left epidural space at L3-4 and L4-5 appears slightly worsened, with new involvement of the left ventral epidural space posterior to L5. Question intradural extension with involvement of the nerve roots of the cauda equina at L4-5 as above. 3. Underlying sigmoid scoliosis with advanced multilevel degenerative spondylosis and facet arthrosis as above, otherwise stable from prior. 4. Cholelithiasis. Electronically Signed   By:  Jeannine Boga M.D.   On: 02/12/2021 22:03    EKG: Independently reviewed.  Assessment/Plan Principal Problem:   Lumbar cord compression (HCC) Active Problems:   Endometrial cancer (HCC)   Essential hypertension   Deep vein blood clot of right lower extremity (HCC)   Metastatic cancer to spine (HCC)    Compression of Lumbar spine / nerve roots by tumor causing radiculopathy - Clinically seems to be acting (and looking based on the official read which finally just came back) more like a radiculopathy than a central cord syndrome. Clearly at risk of progression to central cord syndrome though with questionable involvement of cauda equina noted on the read, enlarging tumor on PET, etc. Empiric decadron Contacted Dr. Sondra Come who will see tomorrow and get rad therapy started Dr. Alvy Bimler will see in hospital, also likely to consult pal care (see office note from today)  Increase oxy to 10-37m Q4H prn Cont pulse ox Replace K and Mg Endometrial CA - With spine met, worsening as discussed above Pal radiation planned to start tomorrow as noted above Med/onc also to see in hospital (sent her in from office for admission). Holding off on ordering PO chemo agents (which dont seem to be working all that great anyhow), and defer ordering of these / changing of meds to Dr. GAlvy Bimler HTN - Hold lasix Cont other BP meds H/O RLE DVT - Cont xarelto Also h/o breast CA and Lung CA - according to chart though the metastatic CA to spine is believed to be endometrial CA (see Dr. GAlvy Bimleroffice note, prior biopsy results).  DVT prophylaxis: Xarelto Code Status: Full Family Communication: Daughter at bedside Disposition Plan: TBD Consults called: Dr. KSondra Come Dr. GAlvy BimlerAdmission status: Place in obs     Xoey Warmoth, JMosbyHospitalists  How to contact the TY[facility] Regional Medical CenterAttending or Consulting provider 7[place]hambraor covering provider during after hours 7[place] for this patient?  Check the care  team in [facility]Sh[facility] Incand look for a) attending/consulting [facility] provider listed and b) the TCinc[facility] Libertyteam listed Log into [email or web address]  Amion Physician Scheduling and messaging for groups and whole hospitals  On call and physician scheduling software for group practices, residents, hospitalists and other medical providers for call, clinic, rotation and shift schedules. OnCall Enterprise is a hospital-wide system for scheduling doctors and paging doctors on call. EasyPlot is for scientific plotting and data analysis.  [email or web address]  and use [facility] universal password to access. If you do not have the password, please contact the hospital operator.  Locate the TWellington Regional Medical Centerprovider you are looking for under [facility] and page to a number that you can be directly reached. If you still have difficulty reaching the provider, please page the [facility](Director on Call) for the Hospitalists listed on amion for assistance.  02/12/2021, 10:07 PM

## 2021-02-12 NOTE — Assessment & Plan Note (Signed)
PET CT imaging result is pending but based on my assessment and review, she has clear signs of disease progression The patient is declining rapidly She had fall yesterday She has numbness on the left lower extremity and incontinent with uncontrolled pain and recent weight loss I spent a lot of time convincing the patient for urgent admission and she finally agreed I will get my nursing staff to access the port and draw CBC and CMP I spoke with Dr. Zenia Resides from emergency department to help expedite admission but unfortunately, the emergency room is full As soon as she get a bit in the emergency department, I recommend MRI total spine screening test to be done as soon as possible I also recommend IV Decadron 4 mg 3 times daily I will consult radiation oncologist to see her as soon as MRI is available for urgent radiation therapy in the hospital We will also consult palliative care team for assistance in pain management while she is hospitalized

## 2021-02-12 NOTE — Assessment & Plan Note (Signed)
She has poorly controlled pain I recommend IV pain medicine while hospitalized and palliative care consult for assistance in pain management

## 2021-02-12 NOTE — Telephone Encounter (Signed)
Received a phone call from lab She has severe hypokalemia and hypocalcemia This is not surprising given her severe diarrhea and poor oral intake As soon as she gets a bed in the emergency department, we will get her started with IV potassium replacement therapy

## 2021-02-12 NOTE — Progress Notes (Signed)
[name] OFFICE PROGRESS NOTE  Patient Care Team: Shon Baton, MD as PCP - General (Internal Medicine) Heath Lark, MD as Consulting Physician (Hematology and Oncology)  ASSESSMENT & PLAN:  Endometrial cancer [facility]) PET CT imaging result is pending but based on my assessment and review, she has clear signs of disease progression The patient is declining rapidly She had fall yesterday She has numbness on the left lower extremity and incontinent with uncontrolled pain and recent weight loss I spent a lot of time convincing the patient for urgent admission and she finally agreed I will get my nursing staff to access the port and draw CBC and CMP I spoke with Dr. Zenia Resides from emergency department to help expedite admission but unfortunately, the emergency room is full As soon as she get a bit in the emergency department, I recommend MRI total spine screening test to be done as soon as possible I also recommend IV Decadron 4 mg 3 times daily I will consult radiation oncologist to see her as soon as MRI is available for urgent radiation therapy in the hospital We will also consult palliative care team for assistance in pain management while she is hospitalized  Metastatic cancer to bone [facility]) She is at risk of imminent cord compression I recommend urgent admission, MRI spine as well as IV Decadron  Recurrent falls while walking She has recurrent falls We will put safety precaution while she is hospitalized  Cancer associated pain She has poorly controlled pain I recommend IV pain medicine while hospitalized and palliative care consult for assistance in pain management  Goals of care, counseling/discussion The goals of care and admission is fully discussed with the patient and family She will be directed to the emergency room for expedited admission She would need MRI of the spine screening protocol, IV dexamethasone and IV pain medicine She will likely need IV  fluid hydration given recent severe diarrhea and continue round-the-clock Imodium We will consult radiation oncologist for urgent palliative radiation treatment to start tomorrow   No orders of the defined types were placed in this encounter.   All questions were answered. The patient knows to call the clinic with any problems, questions or concerns. The total time spent in the appointment was 40 minutes encounter with patients including review of chart and various tests results, discussions about plan of care and coordination of care plan   Heath Lark, MD 02/12/2021 4:27 PM  INTERVAL HISTORY: Please see below for problem oriented charting. she returns for urgent evaluation; she has background history of breast cancer, lung cancer and metastatic uterine cancer on chemotherapy Her husband contacted me before her scheduled PET CT scan for urgent evaluation due to uncontrolled pain I saw her after PET CT scan was done around 4:00 Her daughter, Jerene Pitch is also available to collaborate her history over the phone She has progressive weakness, recurrent falls and uncontrolled pain over the past week She is also incontinent of stool and had regular diarrhea She lives with her daughter recently and she witnessed a fall yesterday She is so weak that she cannot be weighed but she appears that she have suffered more weight loss since last time I saw her Despite her diarrhea, she admits she is not drinking enough fluids Her husband has been giving her oxycodone 10 mg every 4 hours over the past week She complained of lower extremity numbness on the left side  REVIEW OF SYSTEMS:   Constitutional: Denies fevers, chills  Eyes: Denies  blurriness of vision Ears, nose, mouth, throat, and face: Denies mucositis or sore throat Respiratory: Denies cough, dyspnea or wheezes Cardiovascular: Denies palpitation, chest discomfort or lower extremity swelling Gastrointestinal:  Denies nausea, heartburn or change  in bowel habits Skin: Denies abnormal skin rashes Lymphatics: Denies new lymphadenopathy or easy bruising Behavioral/Psych: Mood is stable, no new changes  All other systems were reviewed with the patient and are negative.  I have reviewed the past medical history, past surgical history, social history and family history with the patient and they are unchanged from previous note.  ALLERGIES:  is allergic to ace inhibitors, dilaudid [hydromorphone hcl], and codeine.  MEDICATIONS:  Current Outpatient Medications  Medication Sig Dispense Refill   atenolol (TENORMIN) 25 MG tablet Take 25 mg by mouth 2 (two) times daily.     BIOTIN PO Take 500 mcg by mouth daily.     cetirizine (ZYRTEC) 10 MG tablet Take 10 mg by mouth daily.     cholecalciferol (VITAMIN D3) 25 MCG (1000 UNIT) tablet Take 10,000 Units by mouth daily.     Cyanocobalamin (VITAMIN B 12) 500 MCG TABS Take 1 tablet by mouth daily at 2 PM.     donepezil (ARICEPT) 5 MG tablet TAKE 1 TABLET BY MOUTH EVERYDAY AT BEDTIME 90 tablet 1   furosemide (LASIX) 20 MG tablet Take 1 tablet (20 mg total) by mouth daily. (Patient taking differently: Take 20 mg by mouth daily. Take 2 tabs on a Tuesday/Thursday and Saturday/ 1 tablet on the other days.) 10 tablet 0   hydrALAZINE (APRESOLINE) 25 MG tablet Take 25 mg by mouth daily. One tablet Mid afternoon     hydrALAZINE (APRESOLINE) 50 MG tablet Take 50 mg by mouth 2 (two) times daily.     levothyroxine (SYNTHROID) 50 MCG tablet TAKE 1 TABLET BY MOUTH DAILY BEFORE BREAKFAST 90 tablet 1   memantine (NAMENDA) 5 MG tablet TAKE 2 TABLETS BY MOUTH TWICE A DAY 360 tablet 1   Multiple Vitamin (MULTIVITAMIN WITH MINERALS) TABS tablet Take 1 tablet by mouth daily.     oxyCODONE (OXY IR/ROXICODONE) 5 MG immediate release tablet Take 1 tablet (5 mg total) by mouth every 6 (six) hours as needed for severe pain. 60 tablet 0   Potassium 99 MG TABS Take 1 tablet by mouth daily.     rivaroxaban (XARELTO) 10 MG TABS  tablet Take 1 tablet (10 mg total) by mouth daily. 30 tablet 11   simvastatin (ZOCOR) 20 MG tablet Take 20 mg by mouth every evening.      No current facility-administered medications for this visit.   Facility-Administered Medications Ordered in Other Visits  Medication Dose Route Frequency Provider Last Rate Last Admin   heparin lock flush 100 unit/mL  500 Units Intracatheter Once Alvy Bimler, , MD       sodium chloride flush (NS) 0.9 % injection 10 mL  10 mL Intracatheter Once Heath Lark, MD        SUMMARY OF ONCOLOGIC HISTORY: Oncology History Overview Note  Biopsy of recurrence 06-2014 ER negative. PDL1 testing low at 2% on the uterine cancer She progressed on carboplatin, paclitaxel, alimta (was prescribed for lung cancer), pembrolizumab and lenvima for uterine cancer HER 2 negative on pathology [id number] Negative genetic testing  On her lung cancer sample, [facility] testing revealed 10% PD-1 positive EGFR mutation was detected, exon 19 deletion    Endometrial cancer ([place])  12/01/2007 Initial Diagnosis   She has recurrent papillary serous endometrial carcinoma. Briefly she was  diagnosed in 2009 with a FIGO IB pappilary serous carcinoma treated with hysterectomy followed by adjuvant chemo and vaginal brachytherapy. She then had a recurrence diagnosed in late 2012 that was deemed not to be surgically resectable, and was treated with 6 cycles of carbo/taxol followed by 5040 cGy of EBRT to the pelvic mass. She was then followed with serial imaging and was noted in June of this year to have slight increase in the size of the mass, and again in September there was small enlargement of the mass. She had a re-staging PET scan on 03/10/13 that showed FDG activity in the pelvic mass, with no other areas of disease   12/01/2007 Imaging   CT scan of chest, abdomen and pelvis: 1.  Mass like area of decreased attenuation in the central uterus, consistent with the known endometrial carcinoma.   Deep myometrial invasion is suspected.  Pelvic MRI without and with contrast could be performed for further staging workup if clinically warranted. 2.  No evidence of extrauterine extension or lymphadenopathy. 3.  Sigmoid diverticulosis incidentally noted.   12/15/2007 Surgery   --12/2007 laparoscopic hysterectomy and PLND --05/2008 complted 6 cycles adjuvant carbo/taxol followed by vaginal cuff brachy --03/2011 exploratory lararoscopy, ureterolysis --4/13 completed 6 cycels carbotaxol --8/13 completed EBRT to the left pelvic sidewall    06/10/2008 Imaging   CT scan abdomen and pelvis 1.  Nonspecific mildly prominent inguinal lymph nodes, right greater than left. 2.  Interval hysterectomy without evidence of recurrent pelvic mass or fluid collection.   03/22/2011 Imaging   Ct scan abdomen and pelvis 1.  Local uterine cancer recurrence with two large nodes along the left pelvic sidewall. 2.  No evidence of bowel obstruction, urinary obstruction, or more distant metastasis.   03/31/2011 Relapse/Recurrence   chemotherapy and external beam radiation   05/16/2011 - 09/17/2011 Chemotherapy   She had 6 cycles of carboplatin and Taxol    07/17/2011 Imaging   Ct scan abdomen and pelvis 1.  Interval improvement in the previously demonstrated left pelvic local recurrence of tumor. 2.  No disease progression or complication identified. 3.  Mild bladder wall thickening on the right, nonspecific and possibly related to incomplete distension and/or radiation therapy. 4.  Cholelithiasis   10/11/2011 Imaging   CT scan abdomen and pelvis 1.  Interval enlargement of the left pelvic sidewall masses. 2.  No new nodular disease in the pelvis or lymphadenopathy. 3.  No evidence  of distant metastasis. 4.  Mild hydronephrosis on the right is similar to prior. 5.  Focal thickening of the right aspect the bladder is stable compared to prior.     10/28/2011 - 12/05/2011 Radiation Therapy   radiation for pelvic  sidewall recurrence   10/28/2011 - 12/05/2011 Radiation Therapy   10/28/11-12/05/11: Radiotherapy to the left pelvic sidewall   03/18/2012 Imaging   CT abdomen 1.  Today's study demonstrates a positive response to therapy with decreased size of left pelvic sidewall nodal masses, as detailed above.  No new soft tissue masses or new lymphadenopathy is identified within the abdomen or pelvis. Continue attention on follow-up studies is recommended. 2.  Cholelithiasis without findings to suggest acute cholecystitis.  3.  Colonic diverticulosis without findings to suggest acute diverticulitis at this time. 4.  Mild asymmetric urinary bladder wall thickening is unchanged compared to prior examinations and is nonspecific.  No definite bladder wall mass is identified at this time. 5.  Additional findings, similar to prior examinations, as above   06/22/2012 Imaging  CT abdomen 1.  Further decreased size of the left pelvic peripherally enhancing lesion. 2.  No new sites of new or progressive disease. 3. Esophageal air fluid level suggests dysmotility or gastroesophageal reflux. 4.  Cholelithiasis   11/02/2012 Imaging   CT abdomen 1.  The left pelvic side wall lesion demonstrates mild increase in size from previous exam. 2.  No new sites of new or progressive disease. 3.  Cholelithiasis.     01/28/2013 Imaging   CT abdomen 1. Interval small increase in volume of enhancing mass adjacent to the left pelvic sidewall. 2. No evidence of abdominal or pelvic lymphadenopathy   04/02/2013 Surgery   pelvic mass resection with IORT   04/02/2013 - 04/02/2013 Radiation Therapy   04/02/13: Intraoperative radiotherapy to the left pelvis   05/10/2013 PET scan   1. Hypermetabolic mass in the deep left pelvis consistent with metastasis. 2. No additional evidence of local metastasis. No evidence of distant metastasis. 3. Small right lower lobe pulmonary nodule is not hypermetabolic.   07/27/2013 Imaging   No  evidence of recurrent or metastatic disease. Apparent prior resection of the deep left pelvic metastasis.   02/08/2014 Imaging   No CT findings for recurrent or metastatic disease involving the abdomen/ pelvis   06/13/2014 Relapse/Recurrence   + recurrence paraspinous muscle   06/21/2014 Procedure   There is a soft tissue lesion along the left side of L3-L4. This lesion roughly measures up to 2.2 cm. Needle was positioned along the posterior aspect of the lesion. Small amount of air in the paraspinal tissues following needle removal   06/21/2014 Pathology Results   Bone, biopsy, left lumbar paraspinal - METASTATIC POORLY DIFFERENTIATED CARCINOMA, CONSISTENT WITH HIGH GRADE SEROUS CARCINOMA SEE COMMENT. Microscopic Comment The carcinoma demonstrates the following immunophenotype: Cytokeratin 7 - patchy moderate to strong expression Estrogen receptor - negative expression P53 - strong diffuse expression TTF-1 - negative expression WT-1 - negative expression CD56 - focal moderate strong expression Synaptophysin - negative expression GCDFP - negative expression The history of primary endometrial papillary serous carcinoma and primary mammary carcinoma is noted. In the current case, the overall morphologic and immunophenotype are that of poorly differentiated carcinoma, consistent with high grade serous carcinoma.    07/11/2014 - 08/12/2014 Radiation Therapy   07/11/14-08/12/14: 55 Gy in 25 fractions to the Lumbar spine   08/15/2014 Imaging   CT scan of chest, abdomen and pelvis 1. Since the biopsy study of 06/21/2014, similar to slight decrease in size of a left paraspinous lesion at L3-4. 2. No new sites of metastatic disease identified. 3. 8 mm ground-glass nodule in the right lower lobe is grossly similar to 03/10/2013, suggesting a benign etiology. 4. Cholelithiasis. 5. Apparent sigmoid colonic wall thickening which could be due to underdistention. Colitis felt less likely. 6.  Atherosclerosis,  including within the coronary arteries. 7. Pelvic floor laxity.   10/07/2014 Imaging   CT scan of chest, abdomen and pelvis: Stable to slight interval decrease in size of left paraspinous lesion at L3-4. No new sites of metastatic disease. Unchanged ground-glass nodule within the right lower lobe, potentially benign in etiology. Recommend attention on followup. Cholelithiasis. Sigmoid colonic diverticulosis. No CT evidence to suggest acute diverticulitis.   01/19/2015 Imaging   Ct scan of abdomen and pelvis 1. Decrease in size of left paraspinous metastasis. 2. No new sites of disease. 3. Stable ground-glass attenuating nodule within the superior segment of right lower lobe. 4. Gallstones   05/02/2015 Imaging   Ct abdomen and  pelvis: Slight interval increase in size of left lumbar paraspinal soft tissue metastasis. No new sites of metastatic disease identified within the abdomen or pelvis.     05/11/2015 PET scan   1. The small soft tissue mass just lateral to the left L3 for neural foramen is hypermetabolic, favoring malignancy. 2. There is also a small focus of hypermetabolic activity between the left L1 and L2 transverse processes, but without a CT correlate. 3. The 13 mm sub solid nodule in the superior segment right lower lobe is stable from recent exams but has slowly increased in size over the last 7 years. Although not hypermetabolic, the appearance is concerning for the possibility of a low grade adenocarcinoma. 4. Other imaging findings of potential clinical significance: Chronic bilateral maxillary sinusitis. Coronary, aortic arch, and branch vessel atherosclerotic vascular disease. Aortoiliac atherosclerotic vascular disease. Cholelithiasis. Sigmoid colon diverticulosis. Lumbar scoliosis.   07/11/2015 Imaging   Ct scan of chest, abdomen and pelvis: 1. 13 mm mixed solid and sub solid nodule in the posterior right lower lobe was present in 2009 and has clearly progressed in the  interval since that study. Imaging features remain highly concerning for low-grade or well differentiated adenocarcinoma. 2. Slight increase size of in the hypermetabolic, rim enhancing nodule identified adjacent to the left L3-4 neural foramen. Metastatic disease remains a concern. 3. No evidence for discrete soft tissue lesion between the left L1 and 2 transverse processes, the site of focal FDG uptake on the recent PET-CT. 4. Cholelithiasis. 5. Abdominal aortic atherosclerosis   08/03/2015 - 01/16/2016 Chemotherapy   She received carboplatin and Taxol   11/06/2015 PET scan   1. Hypermetabolic left M5-4 paraspinous metastasis (biopsy-proven) is stable in size and slightly decreased in metabolism. 2. Previously described small focus of hypermetabolism between the left L2 and L3 spinous processes has resolved. 3. New mild linear hypermetabolism to the left of the T11-12 spinous processes without discrete mass on the CT images, favor benign activity related activity, recommend attention on follow-up PET-CT.  4. No definite new sites of hypermetabolic metastatic disease. No recurrent hypermetabolic metastatic disease in the pelvis. 5. Interval stability of subsolid 1.3 cm superior segment right lower lobe pulmonary nodule without associated significant metabolism, which has grown compared to the 2009 chest CT study, and remain suspicious for low grade adenocarcinoma. 6. Additional findings include aortic atherosclerosis, coronary atherosclerosis, mild multinodular goiter with no hypermetabolic thyroid nodules, cholelithiasis and moderate sigmoid diverticulosis.   02/12/2016 PET scan   1. Interval increase in size and metabolic activity of the LEFT paraspinal soft tissue metastasis. 2. New activity within the musculature of the LEFT chest wall. Given the unusual location of the paraspinal metastasis cannot exclude a second soft tissue metastasis to the musculature however favor benign physiologic  activity. 3. Stable RIGHT lower lobe pulmonary nodule. 4. No evidence of local recurrence.       04/08/2016 Imaging   Ct scan of chest, abdomen and pelvis: 1. Similar size of a  left paravertebral abdominal lesion. 2. No new or progressive metastatic disease. 3. No correlate for the muscular activity about the lateral left chest wall. 4.  Coronary artery atherosclerosis. Aortic atherosclerosis. 5. Cholelithiasis. 6. Similar right lower lobe pulmonary nodule.   06/03/2016 Imaging   CT abdomen and pelvis 1. Similar to mild enlargement of a paravertebral soft tissue lesion at the L3-4 level. 2. No new sites of disease identified. 3. Cholelithiasis. 4. Hysterectomy. 5.  Aortic atherosclerosis.     09/02/2016 Imaging  CT abdomen and pelvis 1. Continue mild interval increase in size of LEFT paraspinal mass (1-2 mm). 2. No evidence of new metastatic disease in the abdomen pelvis. 3. Post hysterectomy anatomy   09/26/2016 Imaging   MR lumbar spine 1. Left paraspinal metastasis with epicenter adjacent to the left L3 neural foramen does extend into the foramen to the level of the left lateral epidural space (series 9, image 31), but also tracks cephalad and caudal along the L2-L4 lumbar plexus (series 10, image 15). No extension into the left L2 or L4 neural foramina. And no more distal extension of tumor. 2. Abnormal signal in the medial left psoas and ventral left erector spinae muscles at L2 and L3 is probably denervation related. 3. No bone invasion or osseous metastatic disease. Suspect previous radiation of the L2 through L5 spinal levels. 4. No dural or intradural metastatic disease.     10/22/2016 Procedure   She underwent stereotactic Radiosurgery   10/30/2016 Genetic Testing   Patient has genetic testing done for Inheritable genetic mutation panel. Results revealed patient has no actionable mutation.   01/21/2017 Imaging   1. Reduced size and conspicuity of the left paraspinal  mass at the L3-4 level. The mass still present but has enhancement similar to that of adjacent psoas musculature. 2. No new metastatic disease is identified. 3. Other imaging findings of potential clinical significance: Cholelithiasis. Aortic Atherosclerosis (ICD10-I70.0). Sigmoid diverticulosis. Lumbar scoliosis.   05/09/2017 Imaging   Ct scan of abdomen and pelvis 1. Paraspinal mass adjacent to the left side of L3-L4 is less distinct than prior examinations, and is therefore difficult to discretely measure, however, the overall appearance suggests continued positive response to therapy. 2. No new signs of metastatic disease elsewhere in the abdomen or pelvis. 3. Aortic atherosclerosis, in addition to at least right coronary artery disease. Assessment for potential risk factor modification, dietary therapy or pharmacologic therapy may be warranted, if clinically indicated. 4. Colonic diverticulosis without evidence of acute diverticulitis at this time. 5. Additional incidental findings, as above. Aortic Atherosclerosis (ICD10-I70.0).   08/14/2017 Imaging   1. Treated left paraspinal mass centered at L3-4. Mass size is stable but there has been some evolution of tumor characteristics with less central fluid seen today. Recommend continued surveillance. 2. No evidence of untreated metastasis. 3. Degenerative changes and related impingement are described above.   10/28/2017 Imaging   Stable small left paraspinal soft tissue mass. No new or progressive disease within the abdomen or pelvis.   Cholelithiasis.  No radiographic evidence of cholecystitis.   Colonic diverticulosis, without radiographic evidence of diverticulitis.   04/30/2018 Imaging   Stable post treatment change of the partially necrotic LEFT paravertebral mass at L3-L4. 18 x 21 mm cross-section. Mass effect on the LEFT L3 nerve root redemonstrated. Enhancing and atrophic LEFT psoas is inseparable.   10/15/2018 Imaging   1. Stable  post treatment size and appearance of partially necrotic left paravertebral mass at L3-4, measuring 19 x 25 mm on current exam (unchanged when measured at similar level on previous study). Mass effect on the adjacent left L3 nerve root is unchanged. Adjacent enhancing and atrophic left psoas muscle. 2. Left convex scoliosis with associated multilevel degenerative spondylolysis and facet arthrosis, unchanged   10/29/2018 Imaging   Stable small left L3 paraspinal soft tissue mass. No evidence of new or progressive metastatic disease, or other acute findings.   Cholelithiasis.  No radiographic evidence of cholecystitis.   Colonic diverticulosis, without radiographic evidence of diverticulitis.  11/11/2018 PET scan   1. Low level metabolism (max SUV 2.0) associated with the irregular subsolid superior segment right lower lobe 2.1 cm pulmonary nodule. As better depicted on the recent diagnostic chest CT study, this nodule crosses the major fissure into the right upper lobe and has increased in size and density on multiple imaging studies back to 2009. Findings are most suggestive of primary bronchogenic adenocarcinoma. 2. No hypermetabolic thoracic adenopathy. 3. Persistent hypermetabolism (max SUV 8.0) associated with the known left L3-4 paraspinous metastasis, mildly decreased in metabolism since 02/12/2016 PET-CT. 4. No additional sites of hypermetabolic metastatic disease in the abdomen or pelvis. No metabolic evidence of peritoneal recurrence. No ascites. 5. Chronic findings include: Aortic Atherosclerosis (ICD10-I70.0). Coronary atherosclerosis. Chronic bilateral maxillary sinusitis. Cholelithiasis. Moderate sigmoid diverticulosis.   01/07/2019 - 04/04/2019 Chemotherapy   The patient had carboplatin and Alimta for chemotherapy treatment.     05/03/2019 - 01/23/2021 Chemotherapy   The patient had Pembrolizumab and Lenvima for chemotherapy treatment.     07/23/2019 PET scan   1. Interval  decrease in the hypermetabolism associated with the paraspinal tumor at the L3-4 level. No new sites of hypermetabolic metastatic disease on today's study. 2. Stable appearance of surgical changes in the right lung without features to suggest local recurrence or metastatic lung cancer. 3. Cholelithiasis. 4.  Aortic Atherosclerois (ICD10-170.0)   10/20/2019 Imaging   Bilateral venous Doppler US RIGHT:  - Findings consistent with acute and occlusive deep vein thrombosis involving the right common femoral vein, right external iliac vein, right femoral vein, right popliteal vein, right posterior tibial veins, and right peroneal veins.   Unable to adequately visualize the common iliac veins due to bowel gas. The imaged portions of the IVC were patent.     LEFT:  - No evidence of common femoral vein obstruction.    11/08/2019 PET scan   1. Further reduction in activity at the left L3 level and left paraspinal tissues at L3. Maximum vertebral SUV currently 3.9, previously 4.8 on 07/23/2019 and previously 10.0 on 04/14/2019. 2. Late phase healing of previous left transverse process fractures at L3 and L4. Suspected healing right sixth rib fracture anteriorly. 3. There is new focal activity in the vicinity of the right internal jugular vein in the lower neck. Maximum SUV in this vicinity is 5.1. I not see a well-defined lymph node are thyroid lesion to corroborate with the focus, but surveillance in this region is suggested. 4. Other imaging findings of potential clinical significance: Aortic Atherosclerosis (ICD10-I70.0). Coronary atherosclerosis. Cholelithiasis. Sigmoid colon diverticulosis.   08/16/2020 PET scan   Signs of nodal recurrence in the LEFT pelvis as described.   Increased metabolic activity at L3 without discrete visible lesion, subtle sclerosis in this area seen in the context of degenerative change. But seen also at the site of previous disease suspicious for disease recurrence and with  metabolic activity considerably increased compared to previous imaging.   Signs of partial lung resection in the RIGHT chest as before.   Muscular activity in the bilateral abductor compartment of the upper thigh RIGHT greater than LEFT felt to be physiologic. Correlate with any symptoms in this location.   Aortic atherosclerosis.   08/28/2020 - 01/23/2021 Chemotherapy   Patient is on Treatment Plan : UTERINE Lenvatinib + Pembrolizumab q21d     11/16/2020 PET scan   Resolution of activity associated with LEFT pelvic lymph nodes compared to previous imaging but with worsening of FDG uptake at the site of previous disease in  the L3 vertebral body. No discernible lesion aside from subtle sclerosis at this location. No visible paraspinal soft tissue.   New activity in the subjacent L4 vertebral body, new site of disease versus worsening of adjacent degenerative change.   MRI could be considered for further evaluation of above findings as warranted for direct comparison with previous MR imaging.   Focal increased metabolic uptake about the RIGHT shoulder without visible lesion on CT is of uncertain significance, attention on follow-up.   New area of nodularity about the RIGHT mid chest along the anterior pleural surface without associated increased metabolic activity. Suggest consideration for short interval follow-up.   Cholelithiasis and aortic atherosclerosis.   Aortic Atherosclerosis (ICD10-I70.0).   11/27/2020 Imaging   1. Persistent T1 hypointense, enhancing lesion on the left side of the L3 vertebral body with new enhancement in the superior endplate of the L4 vertebral body on the left, corresponding to areas of increased FDG uptake on recent FDG PET, concerning for metastatic disease. 2. Left paraspinal soft tissue lesion at the L3 level is unchanged. 3. Moderate spinal canal stenosis and severe left neural foraminal narrowing at L3-4 and L4-5.   Metastatic cancer to bone ([place])   06/27/2014 Initial Diagnosis   Metastatic cancer to bone   Primary lung cancer, right ([place])  11/04/2018 Initial Diagnosis   Primary lung cancer, right ([place])   11/11/2018 PET scan   1. Low level metabolism (max SUV 2.0) associated with the irregular subsolid superior segment right lower lobe 2.1 cm pulmonary nodule. As better depicted on the recent diagnostic chest CT study, this nodule crosses the major fissure into the right upper lobe and has increased in size and density on multiple imaging studies back to 2009. Findings are most suggestive of primary bronchogenic adenocarcinoma. 2. No hypermetabolic thoracic adenopathy. 3. Persistent hypermetabolism (max SUV 8.0) associated with the known left L3-4 paraspinous metastasis, mildly decreased in metabolism since 02/12/2016 PET-CT. 4. No additional sites of hypermetabolic metastatic disease in the abdomen or pelvis. No metabolic evidence of peritoneal recurrence. No ascites. 5. Chronic findings include: Aortic Atherosclerosis (ICD10-I70.0). Coronary atherosclerosis. Chronic bilateral maxillary sinusitis. Cholelithiasis. Moderate sigmoid diverticulosis.   12/07/2018 Pathology Results   1. Lung, resection (segmental or lobe), Right Lobe Superior with endblock wedge of right upper lobe - INVASIVE ADENOCARCINOMA, MODERATELY DIFFERENTIATED, SPANNING 1.6 CM. - THE SURGICAL RESECTION MARGINS ARE NEGATIVE FOR CARCINOMA. - SEE ONCOLOGY TABLE BELOW. 2. Lymph node, biopsy, Level 9 #1 - THERE IS NO EVIDENCE OF CARCINOMA IN 1 OF 1 LYMPH NODE (0/1). 3. Lymph node, biopsy, Level 9 #2 - THERE IS NO EVIDENCE OF CARCINOMA IN 1 OF 1 LYMPH NODE (0/1). 4. Lymph node, biopsy, Level 7 #1 - METASTATIC CARCINOMA IN 1 OF 1 LYMPH NODE (1/1). 5. Lymph node, biopsy, Level 7 #2 - THERE IS NO EVIDENCE OF CARCINOMA IN 1 OF 1 LYMPH NODE (0/1). 6. Lymph node, biopsy, Level 12 #1 - THERE IS NO EVIDENCE OF CARCINOMA IN 1 OF 1 LYMPH NODE (0/1). 7. Lymph node, biopsy, Level 12  #2 - THERE IS NO EVIDENCE OF CARCINOMA IN 1 OF 1 LYMPH NODE (0/1). 8. Lymph node, biopsy, Level 10 #1 - THERE IS NO EVIDENCE OF CARCINOMA IN 1 OF 1 LYMPH NODE (0/1). 9. Lymph node, biopsy, Level 4R #1 - THERE IS NO EVIDENCE OF CARCINOMA IN 1 OF 1 LYMPH NODE (0/1). 10. Lymph node, biopsy, Level 4R #2 - THERE IS NO EVIDENCE OF CARCINOMA IN 1 OF 1 LYMPH NODE (0/1).  11. Lymph node, biopsy, Level 2R #1 - THERE IS NO EVIDENCE OF CARCINOMA IN 1 OF 1 LYMPH NODE (0/1). 12. Lung, resection (segmental or lobe), Right Lobe Superior - BENIGN LUNG PARENCHYMA. - THERE IS NO EVIDENCE OF MALIGNANCY. Procedure: Segmentectomy. Specimen Laterality: Right. Tumor Site: Right superior lobe. Tumor Size: 1.6 cm (gross measurement). Tumor Focality: Unifocal. Histologic Type: Adenocarcinoma, moderately differentiated. Visceral Pleura Invasion: Not identified. Lymphovascular Invasion: Not identified. Direct Invasion of Adjacent Structures: Not identified. Margins: Negative for carcinoma. Treatment Effect: N/A Regional Lymph Nodes: Number of Lymph Nodes Involved: 1 (level 7) Number of Lymph Nodes Examined: 10 Pathologic Stage Classification (pTNM, AJCC 8th Edition): pT1b, pN2 Ancillary Studies: The tumor cells are positive for Napsin-A, TTF-1, and p53. They are essentially negative for estrogen receptor, PAX-8 and progesterone receptor. Additional studies can be performed upon clinician request. Representative Tumor Block: 1D-1E. (JBK:gt, 12/09/18)   12/07/2018 Surgery   PREOPERATIVE DIAGNOSIS:  Right lung nodule.   POSTOPERATIVE DIAGNOSIS:  Non-small cell carcinoma- suspected primary lung carcinoma, clinical stage T2N01b.   PROCEDURE:   Right video-assisted thoracoscopy, Right lower lobe superior segmentectomy with en bloc wedge resection of right upper lobe, Lymph node dissection, Intercostal nerve block levels 3-9.   SURGEON:  Modesto Charon, MD   FINDINGS:  Mass palpable in posterior aspect of  superior segment of right lower lobe, likely involvement across fissure into the upper lobe.  Frozen section revealed non-small cell carcinoma. upper and lower lobe margins clear.   12/23/2018 Cancer Staging   Staging form: Lung, AJCC 8th Edition - Pathologic: Stage IIIA (pT1b, pN2, cM0) - Signed by Heath Lark, MD on 12/23/2018   01/05/2019 Imaging   MRI brain 1. No metastatic disease or acute intracranial abnormality identified. 2. Small chronic parafalcine meningioma size is stable since that described in 2006 (12 x 15 mm). 3. Advanced signal changes in the cerebral white matter and to a lesser extent pons, nonspecific but most commonly due to chronic small vessel disease. 4. Partially visible degenerative cervical spinal stenosis.     01/07/2019 - 04/14/2019 Chemotherapy   The patient had carboplatin and Alimta for chemotherapy treatment.     04/14/2019 PET scan   1. 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 As shown on recent MRI of 04/01/2019, there is a new component of invasion into the left lower L3 vertebral body. This new component has a maximum SUV of 10.0, compatible with active malignancy. 2. Interval wedge resection of portions of the right upper lobe and right lower lobe at the site of the prior nodule. There is only minimal low-grade activity along the wedge resection clips, maximum SUV of 1.5. Surveillance is likely warranted. No new nodule identified. 3. Other imaging findings of potential clinical significance: Aortic Atherosclerosis (ICD10-I70.0). Coronary atherosclerosis. Thoracolumbar scoliosis. Cholelithiasis. Sigmoid colon diverticulosis.     05/03/2019 - 01/23/2021 Chemotherapy   The patient had Pembrolizumab and Lenvima for chemotherapy treatment.     05/22/2019 Imaging   Ct head Atrophy, chronic microvascular disease.   No acute intracranial abnormality.     03/09/2020 PET scan   1. No evidence of malignant disease progression. 2.  Similar mild metabolic activity within at the sclerotic L3 vertebral body lesion. Reduction in paraspinal muscular metabolic activity at L3 vertebral body level.     PHYSICAL EXAMINATION: ECOG PERFORMANCE STATUS: 2 - Symptomatic, <50% confined to bed  Vitals:   02/12/21 1600  BP: (!) 177/79  Pulse: 74  Resp: 18  Temp: (!) 97.4  F (36.3 C)  SpO2: 100%   Filed Weights   02/12/21 1600  Weight: 102 lb 8 oz (46.5 kg)    GENERAL:alert, no distress and comfortable SKIN: skin color, texture, turgor are normal, no rashes or significant lesions EYES: normal, Conjunctiva are pink and non-injected, sclera clear OROPHARYNX:no exudate, no erythema and lips, buccal mucosa, and tongue normal  NECK: supple, thyroid normal size, non-tender, without nodularity LYMPH:  no palpable lymphadenopathy in the cervical, axillary or inguinal LUNGS: clear to auscultation and percussion with normal breathing effort HEART: regular rate & rhythm and no murmurs and no lower extremity edema ABDOMEN:abdomen soft, non-tender and normal bowel sounds Musculoskeletal:no cyanosis of digits and no clubbing  NEURO: alert & oriented x 3 with fluent speech, no focal motor/sensory deficits  LABORATORY DATA:  I have reviewed the data as listed    Component Value Date/Time   NA 139 01/23/2021 0957   NA 140 05/09/2017 0844   K 3.7 01/23/2021 0957   K 4.1 05/09/2017 0844   CL 99 01/23/2021 0957   CO2 26 01/23/2021 0957   CO2 26 05/09/2017 0844   GLUCOSE 90 01/23/2021 0957   GLUCOSE 82 05/09/2017 0844   BUN 17 01/23/2021 0957   BUN 20.7 05/09/2017 0844   CREATININE 1.06 (H) 01/23/2021 0957   CREATININE 0.7 05/09/2017 0844   CALCIUM 9.7 01/23/2021 0957   CALCIUM 9.3 05/09/2017 0844   PROT 7.1 01/23/2021 0957   PROT 6.8 05/09/2017 0844   ALBUMIN 3.5 01/23/2021 0957   ALBUMIN 3.6 05/09/2017 0844   AST 51 (H) 01/23/2021 0957   AST 27 05/09/2017 0844   ALT 30 01/23/2021 0957   ALT 17 05/09/2017 0844   ALKPHOS  53 01/23/2021 0957   ALKPHOS 43 05/09/2017 0844   BILITOT 1.2 01/23/2021 0957   BILITOT 0.81 05/09/2017 0844   GFRNONAA 53 (L) 01/23/2021 0957   GFRAA >60 02/07/2020 1109    No results found for: SPEP, UPEP  Lab Results  Component Value Date   WBC 9.2 01/23/2021   NEUTROABS 8.1 (H) 01/23/2021   HGB 13.3 01/23/2021   HCT 39.7 01/23/2021   MCV 98.3 01/23/2021   PLT 148 (L) 01/23/2021      Chemistry      Component Value Date/Time   NA 139 01/23/2021 0957   NA 140 05/09/2017 0844   K 3.7 01/23/2021 0957   K 4.1 05/09/2017 0844   CL 99 01/23/2021 0957   CO2 26 01/23/2021 0957   CO2 26 05/09/2017 0844   BUN 17 01/23/2021 0957   BUN 20.7 05/09/2017 0844   CREATININE 1.06 (H) 01/23/2021 0957   CREATININE 0.7 05/09/2017 0844      Component Value Date/Time   CALCIUM 9.7 01/23/2021 0957   CALCIUM 9.3 05/09/2017 0844   ALKPHOS 53 01/23/2021 0957   ALKPHOS 43 05/09/2017 0844   AST 51 (H) 01/23/2021 0957   AST 27 05/09/2017 0844   ALT 30 01/23/2021 0957   ALT 17 05/09/2017 0844   BILITOT 1.2 01/23/2021 0957   BILITOT 0.81 05/09/2017 0844     I have reviewed her PET CT scan myself even though results are not available

## 2021-02-12 NOTE — ED Provider Notes (Signed)
[facility] DEPT Provider Note   CSN: [id number] Arrival date & time: 02/12/21  1641     History Chief Complaint  Patient presents with   Cancer Patient needing MRI    [name] is a 80 y.o. female.  Patient is an 80 year old female with a history of hypertension, noon endometrial cancer and spinal mass who is currently on oral chemotherapy who is presenting today from the cancer center after having a PET scan that showed concern for evidence of spinal cord compromise.  Patient reports in the last few days she has had worsening back pain and weakness in her left lower leg.  She denies any known injury.  She has no numbness to the leg but is even starting to use a walker in the last few days because it is very painful to try to lift her left leg.  She denies any bowel or bladder problems.  She has not had any fever.  Even her oxycodone 10 mg though does not seem to be controlling the pain like it normally would.  Dr. Sherren Kerns had called the ER reporting she needs MRI of c/t/l spine and would most likely need admission and palliative radiation.  The history is provided by the patient and a relative.      Past Medical History:  Diagnosis Date   Eczema    Endometrial cancer ([place]) 12/2007   s/p total abdominal hysterectomy at [facility] - ovaries were also removed    Family history of breast cancer    History of breast cancer    right breast -- treated with lumpectomy and radiation therarpy, postoperatively with tamoxifen    History of radiation therapy 10/22/2016 to 10/28/2016   Hypercholesterolemia    Hyperlipidemia    Hypertension    Palpitations    Personal history of radiation therapy 2006   PVC's (premature ventricular contractions)    Radiation 07/11/14-08/12/14   left lumbar paraspinal area 55 gray   S/P radiation therapy 10/28/11-12/05/11   5040 cGy left pelvis   S/P radiation therapy    Intracavitary brachytherapy of uterus   Urticaria      Patient Active Problem List   Diagnosis Date Noted   Recurrent falls while walking 01/02/2021   H/O laceration of skin 01/02/2021   Cancer associated pain 11/17/2020   Rash and other nonspecific skin eruption 09/28/2020   Pruritus 09/28/2020   Skin rash 08/28/2020   Leg ulcer ([place]) 01/12/2020   Cellulitis of left leg 12/28/2019   Anemia, chronic disease 12/28/2019   Bilateral edema of lower extremity 12/28/2019   Diarrhea 12/16/2019   Memory impairment 11/29/2019   Malignant cachexia ([place]) 11/08/2019   Deep vein blood clot of right lower extremity ([place]) 11/08/2019   Recurrent epistaxis 11/08/2019   Elevated serum creatinine 08/16/2019   Acquired hypothyroidism 07/26/2019   TIA (transient ischemic attack) 05/25/2019   Goals of care, counseling/discussion 04/19/2019   Dehydration 01/14/2019   Pancytopenia, acquired ([place]) 01/14/2019   Dysuria 12/29/2018   Right lower lobe pulmonary nodule 12/07/2018   Primary lung cancer, right ([place]) 11/04/2018   Essential hypertension 01/24/2017   Genetic testing 10/31/2016   History of breast cancer    Family history of breast cancer    Aortic atherosclerosis ([place]) 06/08/2016   Plantar fasciitis of left foot 12/13/2015   Anemia due to antineoplastic chemotherapy 11/14/2015   Lactose intolerance in adult 11/14/2015   Chemotherapy-induced neuropathy ([place]) 10/08/2015   Dense breast tissue 10/08/2015   Port catheter  in place 09/28/2015   Central line complication 32/95/1884   Chemotherapy induced neutropenia ([place]) 08/22/2015   Arterial hypotension 08/12/2015   HX: breast cancer 02/07/2015   Metastatic cancer to bone ([place]) 06/27/2014   Hypotension 06/27/2011   Endometrial cancer ([place]) 05/15/2011   Hypercholesterolemia 12/28/2010   Palpitations 12/28/2010    Past Surgical History:  Procedure Laterality Date   ADENOIDECTOMY     APPENDECTOMY     BREAST LUMPECTOMY Right 2000   COLONOSCOPY     3X   FOOT SURGERY     RIGHT    SEGMENTECOMY Right 12/07/2018   Procedure: RIGHT LOWER LOBE SUPERIOR SEGMENTECTOMY;  Surgeon: Melrose Nakayama, MD;  Location: [facility];  Service: Thoracic;  Laterality: Right;   TONSILLECTOMY     TONSILLECTOMY     TOTAL ABDOMINAL HYSTERECTOMY W/ BILATERAL SALPINGOOPHORECTOMY     VIDEO ASSISTED THORACOSCOPY Right 12/07/2018   Procedure: VIDEO ASSISTED THORACOSCOPY;  Surgeon: Melrose Nakayama, MD;  Location: [facility];  Service: Thoracic;  Laterality: Right;     OB History   No obstetric history on file.     Family History  Problem Relation Age of Onset   Thrombosis Father        coronary thrombosis   Hypertension Father    Heart failure Mother    Asthma Mother    Coronary artery disease Brother    Breast cancer Maternal Aunt        dx in her 17s   Stroke Maternal Uncle    Stroke Maternal Grandfather    Allergic rhinitis Neg Hx    Eczema Neg Hx    Urticaria Neg Hx     Social History   Tobacco Use   Smoking status: Former    Types: Cigarettes    Quit date: 05/13/1962    Years since quitting: 58.7   Smokeless tobacco: Never  Vaping Use   Vaping Use: Never used  Substance Use Topics   Alcohol use: Yes    Comment: occasionally   Drug use: No    Home Medications Prior to Admission medications   Medication Sig Start Date End Date Taking? Authorizing Provider  atenolol (TENORMIN) 25 MG tablet Take 25 mg by mouth every morning. 01/08/11  Yes Darlin Coco, MD  BIOTIN PO Take 500 mcg by mouth every morning.   Yes [provider]  hydrALAZINE (APRESOLINE) 25 MG tablet Take 25 mg by mouth See admin instructions. Take one tablet (25 mg) by mouth at 3pm for DBP >80 10/16/20  Yes [provider]  hydrALAZINE (APRESOLINE) 50 MG tablet Take 50 mg by mouth at bedtime. 10/18/20  Yes [provider]  cetirizine (ZYRTEC) 10 MG tablet Take 10 mg by mouth daily.    [provider]  cholecalciferol (VITAMIN D3) 25 MCG (1000 UNIT) tablet Take 10,000  Units by mouth daily.    [provider]  Cyanocobalamin (VITAMIN B 12) 500 MCG TABS Take 1 tablet by mouth daily at 2 PM.    [provider]  donepezil (ARICEPT) 5 MG tablet TAKE 1 TABLET BY MOUTH EVERYDAY AT BEDTIME 12/04/20   Suzzanne Cloud, NP  furosemide (LASIX) 20 MG tablet Take 1 tablet (20 mg total) by mouth daily. Patient taking differently: Take 20 mg by mouth daily. Take 2 tabs on a Tuesday/Thursday and Saturday/ 1 tablet on the other days. 01/02/20   Virgel Manifold, MD  levothyroxine (SYNTHROID) 50 MCG tablet TAKE 1 TABLET BY MOUTH DAILY BEFORE BREAKFAST 01/25/21  Heath Lark, MD  memantine (NAMENDA) 5 MG tablet TAKE 2 TABLETS BY MOUTH TWICE A DAY 10/19/20   Suzzanne Cloud, NP  Multiple Vitamin (MULTIVITAMIN WITH MINERALS) TABS tablet Take 1 tablet by mouth daily.    [provider]  oxyCODONE (OXY IR/ROXICODONE) 5 MG immediate release tablet Take 1 tablet (5 mg total) by mouth every 6 (six) hours as needed for severe pain. 01/23/21   Heath Lark, MD  Potassium 99 MG TABS Take 1 tablet by mouth daily.    [provider]  rivaroxaban (XARELTO) 10 MG TABS tablet Take 1 tablet (10 mg total) by mouth daily. 11/08/19   Heath Lark, MD  simvastatin (ZOCOR) 20 MG tablet Take 20 mg by mouth every evening.     [provider]    Allergies    Ace inhibitors, Dilaudid [hydromorphone hcl], and Codeine  Review of Systems   Review of Systems  All other systems reviewed and are negative.  Physical Exam Updated Vital Signs BP (!) 162/89   Pulse 76   Temp 98.9 F (37.2 C) (Oral)   Resp (!) 23   SpO2 98%   Physical Exam Vitals and nursing note reviewed.  Constitutional:      General: She is not in acute distress.    Appearance: Normal appearance. She is well-developed.  HENT:     Head: Normocephalic and atraumatic.  Eyes:     Pupils: Pupils are equal, round, and reactive to light.  Cardiovascular:     Rate and Rhythm: Normal rate and regular  rhythm.     Heart sounds: Normal heart sounds. No murmur heard.   No friction rub.  Pulmonary:     Effort: Pulmonary effort is normal.     Breath sounds: Normal breath sounds. No wheezing or rales.  Abdominal:     General: Bowel sounds are normal. There is no distension.     Palpations: Abdomen is soft.     Tenderness: There is no abdominal tenderness. There is no guarding or rebound.  Musculoskeletal:        General: Tenderness present. Normal range of motion.     Cervical back: Normal range of motion and neck supple.     Comments: No edema.  Tenderness in the lower back  Skin:    General: Skin is warm and dry.     Findings: No rash.  Neurological:     Mental Status: She is alert and oriented to person, place, and time.     Cranial Nerves: No cranial nerve deficit.     Comments: Pt is able to stand and pivot from the wheelchair.  Unable to lift left leg off the bed more than a few cm.  Normal dorsi/plantar flexion.  5/5 strength in the right lower ext.  Sensation intact.  Psychiatric:        Mood and Affect: Mood normal.        Behavior: Behavior normal.    ED Results / Procedures / Treatments   Labs (all labs ordered are listed, but only abnormal results are displayed) Labs Reviewed  RESP PANEL BY RT-PCR (FLU A&B, COVID) ARPGX2  MAGNESIUM    EKG EKG Interpretation  Date/Time:  Monday February 12 2021 20:42:28 EDT Ventricular Rate:  75 PR Interval:  137 QRS Duration: 100 QT Interval:  411 QTC Calculation: 460 R Axis:   60 Text Interpretation: Sinus rhythm LAE, consider biatrial enlargement Borderline ST depression, diffuse leads No significant change since last tracing Confirmed by Maryan Rued,  Loree Fee [phone]) on 02/12/2021 8:53:49 PM  Radiology MR Cervical Spine W or Wo Contrast  Result Date: 02/12/2021 CLINICAL DATA:  Initial evaluation for endometrial carcinoma, metastatic evaluation. Frequent falls. EXAM: MRI CERVICAL SPINE WITHOUT AND WITH CONTRAST TECHNIQUE:  Multiplanar and multiecho pulse sequences of the cervical spine, to include the craniocervical junction and cervicothoracic junction, were obtained without and with intravenous contrast. CONTRAST:  79mL GADAVIST GADOBUTROL 1 MMOL/ML IV SOLN COMPARISON:  Comparison made with PET-CT performed earlier the same day. FINDINGS: Alignment: Straightening of the normal cervical lordosis. Grade 1 anterolisthesis of C2 on C3 and C7 on T1, chronic and degenerative. Vertebrae: Vertebral body height maintained without acute or chronic fracture. Bone marrow signal intensity within normal limits. No discrete or worrisome osseous lesions. No evidence for osseous metastatic disease within the cervical spine. No evidence for osteomyelitis discitis or septic arthritis. Mild reactive marrow edema about the left C2-3 facet due to facet arthritis. Cord: Normal signal and morphology. No epidural or intradural tumor. No abnormal enhancement. Posterior Fossa, vertebral arteries, paraspinal tissues: T2/STIR signal abnormality within the visualized pons, likely related to chronic microvascular ischemic disease. Visualized brain and posterior fossa otherwise unremarkable. Craniocervical junction normal. Paraspinous and prevertebral soft tissues within normal limits. Normal flow voids seen within the vertebral arteries bilaterally. 5 mm right thyroid nodule noted, of doubtful significance given size and patient age, no follow-up imaging recommended (ref: J Am Coll Radiol. 2015 Feb;12(2): 143-50). Disc levels: C2-C3: Anterolisthesis. Mild disc bulge with prominent left-sided facet degeneration. No spinal stenosis. Foramina remain patent. C3-C4: Degenerative intervertebral disc space narrowing with diffuse disc osteophyte complex. Flattening of the ventral thecal sac with resultant mild spinal stenosis. Moderate right C4 foraminal narrowing. Left neural foramina remains patent. C4-C5: Degenerative intervertebral disc space narrowing with diffuse  disc osteophyte complex. Broad posterior component flattens and partially faces the ventral thecal sac. Superimposed facet and ligament flavum hypertrophy. Resultant moderate spinal stenosis with minimal cord flattening, but no cord signal changes. Moderate right C5 foraminal narrowing. Left neural foramina remains patent. C5-C6: Degenerative intervertebral disc space narrowing with diffuse disc osteophyte complex. Broad posterior component flattens and effaces the ventral thecal sac, asymmetric to the right. Resultant moderate spinal stenosis with mild cord flattening, worse on the right. No cord signal changes. Severe right with moderate left C6 foraminal narrowing. C6-C7: Degenerative intervertebral disc space narrowing with diffuse disc osteophyte complex. Flattening and partial effacement of the ventral thecal sac with resultant mild spinal stenosis. Moderate bilateral C7 foraminal narrowing. C7-T1: Anterolisthesis with mild disc bulge. Moderate bilateral facet arthrosis. No significant spinal stenosis. Foramina remain patent. IMPRESSION: 1. No evidence for metastatic disease within the cervical spine. 2. Moderate multilevel cervical spondylosis with resultant mild to moderate diffuse spinal stenosis at C3-4 through C6-7. 3. Multifactorial degenerative changes with resultant multilevel foraminal narrowing as above. Notable findings include moderate right C4 and C5 foraminal narrowing, severe right with moderate left C6 foraminal stenosis, with moderate bilateral C7 foraminal narrowing. Electronically Signed   By: Jeannine Boga M.D.   On: 02/12/2021 20:43   MR THORACIC SPINE W WO CONTRAST  Result Date: 02/12/2021 CLINICAL DATA:  Initial evaluation for endometrial carcinoma, metastatic evaluation. Frequent falls. EXAM: MRI THORACIC WITHOUT AND WITH CONTRAST TECHNIQUE: Multiplanar and multiecho pulse sequences of the thoracic spine were obtained without and with intravenous contrast. CONTRAST:  71mL  GADAVIST GADOBUTROL 1 MMOL/ML IV SOLN COMPARISON:  PET-CT from earlier the same day. FINDINGS: Alignment: Thoracolumbar scoliosis partially visualized. Trace degenerative anterolisthesis of T2 on  T3. Vertebrae: Vertebral body height maintained without acute or chronic fracture. Bone marrow signal intensity diffusely heterogeneous. Few scattered benign hemangiomata noted, most prominent of which measures 1.5 cm within the T8 vertebral body. No worrisome osseous lesions. No evidence for osseous metastatic disease. No evidence for osteomyelitis discitis or septic arthritis. Cord: Normal signal and morphology. No epidural or intradural tumor. No abnormal enhancement. Paraspinal and other soft tissues: Paraspinous soft tissues demonstrate no acute or significant finding. Partially visualized lungs are grossly clear. 5 mm right thyroid nodule partially visualized, of doubtful significance given size and patient age, no follow-up imaging recommended (ref: J Am Coll Radiol. 2015 Feb;12(2): 143-50).Remainder the visualized visceral structures demonstrate no other significant finding. Disc levels: T9-10: Right paracentral disc protrusion indents the right ventral thecal sac (series 26, image 12). Mild flattening of the right ventral cord without cord signal changes or significant spinal stenosis. Otherwise, ordinary for age degenerative disc disease and facet hypertrophy seen elsewhere throughout the thoracic spine. No other significant spinal stenosis. Foramina remain patent. IMPRESSION: 1. No evidence for metastatic disease within the thoracic spine. 2. Right paracentral disc protrusion at T9-10 with mild flattening of the right hemi cord. No cord signal changes or significant stenosis. Electronically Signed   By: Jeannine Boga M.D.   On: 02/12/2021 21:20    Procedures Procedures   Medications Ordered in ED Medications  potassium chloride 10 mEq in 100 mL IVPB (has no administration in time range)  potassium  chloride SA (KLOR-CON) CR tablet 40 mEq (has no administration in time range)  dexamethasone (DECADRON) injection 4 mg (has no administration in time range)  0.9 %  sodium chloride infusion (has no administration in time range)  gadobutrol (GADAVIST) 1 MMOL/ML injection 5 mL (5 mLs Intravenous Contrast Given 02/12/21 1943)  oxyCODONE-acetaminophen (PERCOCET/ROXICET) 5-325 MG per tablet 2 tablet (2 tablets Oral Given 02/12/21 2047)    ED Course  I have reviewed the triage vital signs and the nursing notes.  Pertinent labs & imaging results that were available during my care of the patient were reviewed by me and considered in my medical decision making (see chart for details).    MDM Rules/Calculators/A&P                           Patient with known spinal cancer that has been stable but now more pain in the last week and having weakness and pain in the left lower extremity.  Patient had a PET scan today and there was concern for eminent spinal cord compromise.  Patient's oncologist sent her to the emergency room for MRIs, admission and possible steroids.  Patient reports her pain is not too bad right now but her daughter feels that she needs at least her home pain medication.  She denies any infectious symptoms. Pt's labs from today also showed potassium of 2.4.  will replace IV and oral.  Cervical MRI is neg but l/t mri still pending.  MR t spine is neg for cord compression.  Pt started on decadron.  Given pain control and fluids due to her ongoing diarrhea and poor po intake and will admit for further care.  CRITICAL CARE Performed by: Jermon Chalfant Total critical care time: 30 minutes Critical care time was exclusive of separately billable procedures and treating other patients. Critical care was necessary to treat or prevent imminent or life-threatening deterioration. Critical care was time spent personally by me on the following activities: development of  treatment plan with patient  and/or surrogate as well as nursing, discussions with consultants, evaluation of patient's response to treatment, examination of patient, obtaining history from patient or surrogate, ordering and performing treatments and interventions, ordering and review of laboratory studies, ordering and review of radiographic studies, pulse oximetry and re-evaluation of patient's condition.  MDM   Amount and/or Complexity of Data Reviewed Clinical lab tests: ordered and reviewed Tests in the radiology section of CPT: ordered and reviewed Tests in the medicine section of CPT: ordered and reviewed Obtain history from someone other than the patient: yes Review and summarize past medical records: yes Discuss the patient with other providers: yes Independent visualization of images, tracings, or specimens: yes  Risk of Complications, Morbidity, and/or Mortality Presenting problems: high Diagnostic procedures: high Management options: high  Patient Progress Patient progress: stable      Final Clinical Impression(s) / ED Diagnoses Final diagnoses:  Hypokalemia  Cord compression [facility])    Rx / DC Orders ED Discharge Orders     None        Blanchie Dessert, MD 02/12/21 2138

## 2021-02-12 NOTE — Assessment & Plan Note (Signed)
She has recurrent falls We will put safety precaution while she is hospitalized

## 2021-02-12 NOTE — ED Triage Notes (Signed)
Pt BIB Cancer center [facility]. Pt has hx of endometrial cancer with mets to the bone. RN states pt needs MRI and to start radiation. Pt has port to left side chest accessed. Pt has had labs ordered and drawn today. Pt seen by Dr. Alvy Bimler.

## 2021-02-12 NOTE — Telephone Encounter (Signed)
Husband called and left a message. Alle has been having terrible pain for the last few days. He trying to get her to PET scan today at 2 pm at [facility]. She having a hard time walking due to the pain. She is taking the oxycodone different than the instructions, she is taking oxycodone 5 mg, 2 tabs every 4 hours to help with the pain. She needs a oxycodone refill and is asking for a long acting pain medication.

## 2021-02-12 NOTE — Assessment & Plan Note (Signed)
The goals of care and admission is fully discussed with the patient and family She will be directed to the emergency room for expedited admission She would need MRI of the spine screening protocol, IV dexamethasone and IV pain medicine She will likely need IV fluid hydration given recent severe diarrhea and continue round-the-clock Imodium We will consult radiation oncologist for urgent palliative radiation treatment to start tomorrow

## 2021-02-12 NOTE — Assessment & Plan Note (Signed)
She is at risk of imminent cord compression I recommend urgent admission, MRI spine as well as IV Decadron

## 2021-02-12 NOTE — ED Provider Notes (Signed)
Emergency Medicine Provider Triage Evaluation Note  [name] , a 80 y.o. female  was evaluated in triage.  Pt sent from cancer center for admission.  Had PET scan today with progression of disease, she is at risk of imminent cord compression, patient will need an urgent admission for MRI, steroids and radiation likely.  Dr. Alvy Bimler has already completed lab work in the office today.  Will need MRIs and admission.  Review of Systems  Positive: back pain Negative: Fevers, vomiting, chest pain  Physical Exam  BP (!) 189/100 (BP Location: Right Arm)   Pulse 77   Temp 98.9 F (37.2 C) (Oral)   Resp 16   SpO2 98%  Gen:   Awake, no distress   Resp:  Normal effort  MSK:   Moves extremities without difficulty  Other:    Medical Decision Making  Medically screening exam initiated at 5:25 PM.  Appropriate orders placed.  [name] was informed that the remainder of the evaluation will be completed by another provider, this initial triage assessment does not replace that evaluation, and the importance of remaining in the ED until their evaluation is complete.     Jacqlyn Larsen, PA-C 02/12/21 1732    Blanchie Dessert, MD 02/15/21 1801

## 2021-02-13 ENCOUNTER — Inpatient Hospital Stay: Payer: Medicare Other

## 2021-02-13 ENCOUNTER — Inpatient Hospital Stay: Payer: Medicare Other | Admitting: Hematology and Oncology

## 2021-02-13 ENCOUNTER — Ambulatory Visit
Admit: 2021-02-13 | Discharge: 2021-02-13 | Disposition: A | Discharge status: Home / Self Care | Payer: Medicare Other | Attending: Radiation Oncology | Admitting: Radiation Oncology

## 2021-02-13 ENCOUNTER — Ambulatory Visit
Admit: 2021-02-13 | Discharge: 2021-02-13 | Disposition: A | Discharge status: Home / Self Care | Payer: Medicare Other | Source: Ambulatory Visit | Attending: Radiation Oncology | Admitting: Radiation Oncology

## 2021-02-13 ENCOUNTER — Ambulatory Visit: Payer: Medicare Other | Admitting: Radiation Oncology

## 2021-02-13 VITALS — BP 151/75 | HR 69 | Temp 97.8°F | Resp 16

## 2021-02-13 DIAGNOSIS — R296 Repeated falls: Secondary | ICD-10-CM | POA: Diagnosis present

## 2021-02-13 DIAGNOSIS — R197 Diarrhea, unspecified: Secondary | ICD-10-CM

## 2021-02-13 DIAGNOSIS — G893 Neoplasm related pain (acute) (chronic): Secondary | ICD-10-CM

## 2021-02-13 DIAGNOSIS — C3491 Malignant neoplasm of unspecified part of right bronchus or lung: Secondary | ICD-10-CM

## 2021-02-13 DIAGNOSIS — Z20822 Contact with and (suspected) exposure to covid-19: Secondary | ICD-10-CM | POA: Diagnosis present

## 2021-02-13 DIAGNOSIS — C541 Malignant neoplasm of endometrium: Secondary | ICD-10-CM | POA: Diagnosis present

## 2021-02-13 DIAGNOSIS — K5903 Drug induced constipation: Secondary | ICD-10-CM | POA: Diagnosis not present

## 2021-02-13 DIAGNOSIS — L89302 Pressure ulcer of unspecified buttock, stage 2: Secondary | ICD-10-CM | POA: Diagnosis present

## 2021-02-13 DIAGNOSIS — T402X5A Adverse effect of other opioids, initial encounter: Secondary | ICD-10-CM | POA: Diagnosis not present

## 2021-02-13 DIAGNOSIS — Z853 Personal history of malignant neoplasm of breast: Secondary | ICD-10-CM | POA: Diagnosis not present

## 2021-02-13 DIAGNOSIS — Z90710 Acquired absence of both cervix and uterus: Secondary | ICD-10-CM | POA: Diagnosis not present

## 2021-02-13 DIAGNOSIS — C7951 Secondary malignant neoplasm of bone: Principal | ICD-10-CM

## 2021-02-13 DIAGNOSIS — E7800 Pure hypercholesterolemia, unspecified: Secondary | ICD-10-CM | POA: Diagnosis present

## 2021-02-13 DIAGNOSIS — I82401 Acute embolism and thrombosis of unspecified deep veins of right lower extremity: Secondary | ICD-10-CM | POA: Diagnosis present

## 2021-02-13 DIAGNOSIS — I10 Essential (primary) hypertension: Secondary | ICD-10-CM | POA: Diagnosis present

## 2021-02-13 DIAGNOSIS — I824Y1 Acute embolism and thrombosis of unspecified deep veins of right proximal lower extremity: Secondary | ICD-10-CM

## 2021-02-13 DIAGNOSIS — M5416 Radiculopathy, lumbar region: Secondary | ICD-10-CM | POA: Diagnosis present

## 2021-02-13 DIAGNOSIS — E43 Unspecified severe protein-calorie malnutrition: Secondary | ICD-10-CM | POA: Diagnosis present

## 2021-02-13 DIAGNOSIS — D638 Anemia in other chronic diseases classified elsewhere: Secondary | ICD-10-CM | POA: Diagnosis not present

## 2021-02-13 DIAGNOSIS — F0390 Unspecified dementia without behavioral disturbance: Secondary | ICD-10-CM | POA: Diagnosis present

## 2021-02-13 DIAGNOSIS — D630 Anemia in neoplastic disease: Secondary | ICD-10-CM | POA: Diagnosis present

## 2021-02-13 DIAGNOSIS — L8990 Pressure ulcer of unspecified site, unspecified stage: Secondary | ICD-10-CM | POA: Insufficient documentation

## 2021-02-13 DIAGNOSIS — E739 Lactose intolerance, unspecified: Secondary | ICD-10-CM | POA: Diagnosis present

## 2021-02-13 DIAGNOSIS — Z515 Encounter for palliative care: Secondary | ICD-10-CM | POA: Diagnosis not present

## 2021-02-13 DIAGNOSIS — C549 Malignant neoplasm of corpus uteri, unspecified: Secondary | ICD-10-CM | POA: Insufficient documentation

## 2021-02-13 DIAGNOSIS — G9520 Unspecified cord compression: Secondary | ICD-10-CM | POA: Diagnosis not present

## 2021-02-13 DIAGNOSIS — Z8542 Personal history of malignant neoplasm of other parts of uterus: Secondary | ICD-10-CM | POA: Diagnosis not present

## 2021-02-13 DIAGNOSIS — Z510 Encounter for antineoplastic radiation therapy: Secondary | ICD-10-CM | POA: Insufficient documentation

## 2021-02-13 DIAGNOSIS — I679 Cerebrovascular disease, unspecified: Secondary | ICD-10-CM | POA: Diagnosis not present

## 2021-02-13 DIAGNOSIS — E876 Hypokalemia: Secondary | ICD-10-CM

## 2021-02-13 DIAGNOSIS — I700 Atherosclerosis of aorta: Secondary | ICD-10-CM | POA: Diagnosis present

## 2021-02-13 DIAGNOSIS — Z923 Personal history of irradiation: Secondary | ICD-10-CM | POA: Diagnosis not present

## 2021-02-13 DIAGNOSIS — Z8673 Personal history of transient ischemic attack (TIA), and cerebral infarction without residual deficits: Secondary | ICD-10-CM | POA: Diagnosis not present

## 2021-02-13 DIAGNOSIS — E039 Hypothyroidism, unspecified: Secondary | ICD-10-CM | POA: Diagnosis present

## 2021-02-13 DIAGNOSIS — S14129A Central cord syndrome at unspecified level of cervical spinal cord, initial encounter: Secondary | ICD-10-CM | POA: Diagnosis present

## 2021-02-13 LAB — CBC
HCT: 35.5 % — ABNORMAL LOW (ref 36.0–46.0)
Hemoglobin: 11.9 g/dL — ABNORMAL LOW (ref 12.0–15.0)
MCH: 32.6 pg (ref 26.0–34.0)
MCHC: 33.5 g/dL (ref 30.0–36.0)
MCV: 97.3 fL (ref 80.0–100.0)
Platelets: 150 10*3/uL (ref 150–400)
RBC: 3.65 MIL/uL — ABNORMAL LOW (ref 3.87–5.11)
RDW: 14 % (ref 11.5–15.5)
WBC: 4.6 10*3/uL (ref 4.0–10.5)
nRBC: 0 % (ref 0.0–0.2)

## 2021-02-13 LAB — BASIC METABOLIC PANEL
Anion gap: 10 (ref 5–15)
BUN: 20 mg/dL (ref 8–23)
CO2: 25 mmol/L (ref 22–32)
Calcium: 8.6 mg/dL — ABNORMAL LOW (ref 8.9–10.3)
Chloride: 104 mmol/L (ref 98–111)
Creatinine, Ser: 0.98 mg/dL (ref 0.44–1.00)
GFR, Estimated: 58 mL/min — ABNORMAL LOW (ref >60)
Glucose, Bld: 131 mg/dL — ABNORMAL HIGH (ref 70–99)
Potassium: 3.8 mmol/L (ref 3.5–5.1)
Sodium: 139 mmol/L (ref 135–145)

## 2021-02-13 LAB — CMP (CANCER CENTER ONLY)
ALT: 31 U/L (ref 10–47)
AST: 41 U/L — ABNORMAL HIGH (ref 11–38)
Albumin: 3.4 g/dL — ABNORMAL LOW (ref 3.5–5.0)
Alkaline Phosphatase: 54 U/L (ref 38–126)
Anion gap: 16 — ABNORMAL HIGH (ref 5–15)
BUN: 20 mg/dL (ref 8–23)
CO2: 21 mmol/L — ABNORMAL LOW (ref 22–32)
Calcium: 9.2 mg/dL (ref 8.9–10.3)
Chloride: 102 mmol/L (ref 98–111)
Creatinine: 0.91 mg/dL (ref 0.60–1.20)
GFR, Estimated: >60 mL/min (ref >60)
Glucose, Bld: 110 mg/dL — ABNORMAL HIGH (ref 70–99)
Potassium: 3.7 mmol/L (ref 3.5–5.1)
Sodium: 139 mmol/L (ref 135–145)
Total Bilirubin: 0.8 mg/dL (ref 0.2–1.6)
Total Protein: 6.8 g/dL (ref 6.5–8.1)

## 2021-02-13 LAB — TSH: TSH: 4.034 u[IU]/mL — ABNORMAL HIGH (ref 0.308–3.960)

## 2021-02-13 MED ORDER — DEXAMETHASONE SODIUM PHOSPHATE 4 MG/ML IJ SOLN
4.0000 mg | Freq: Two times a day (BID) | INTRAMUSCULAR | Status: DC
  Administered 2021-02-13 – 2021-02-15 (×4): 4 mg via INTRAVENOUS
  Filled 2021-02-13 (×4): qty 1

## 2021-02-13 MED ORDER — ZINC OXIDE 40 % EX OINT
TOPICAL_OINTMENT | CUTANEOUS | Status: DC | PRN
  Administered 2021-02-13: 1 via TOPICAL
  Filled 2021-02-13: qty 57

## 2021-02-13 NOTE — Plan of Care (Signed)
  Problem: Nutrition: Goal: Adequate nutrition will be maintained Outcome: Progressing   Problem: Pain Managment: Goal: General experience of comfort will improve Outcome: Progressing   

## 2021-02-13 NOTE — Progress Notes (Signed)
During skin assessment with another RN and nurse tech, the patient was wearing a brief and found to have severe MASD to whole perineum area along with a stage 2 on the right buttocks, and a stage 1 on the left. Because a purewick would not be beneficial to the patient this nurse requested foley insertion and destin. Patient was informed and educated and agreeable to placement of the catheter. Skin issues document in flowsheets.

## 2021-02-13 NOTE — Progress Notes (Signed)
Radiation Oncology         [phone] ________________________________  Inpatient reevaluation note  Name: [name] MRN: [id number]  Date: 02/13/2021  DOB: Sep 27, 1940  NO:MVEHM, [name], MD  Etta Quill, DO   REFERRING PHYSICIAN: Etta Quill, DO  DIAGNOSIS: The primary encounter diagnosis was Endometrial cancer [facility]). Diagnoses of Metastatic cancer to bone [facility]), Primary lung cancer, right ([place]), and Metastatic cancer to spine [facility]) were also pertinent to this visit.  Endometrial cancer with mets to lumbar spine  HISTORY OF PRESENT ILLNESS::[name] is a 80 y.o. female who is accompanied by no one. she is seen as a courtesy of Dr. Alcario Drought for an opinion concerning radiation therapy as part of management for her  diagnosed endometrial cancer with metastasis to the lumbar spine. (Patient was last seen for follow-up with me in 2016; radiation treatment detailed below).  The patient presented to Dr. Alvy Bimler on 02/12/21 and was found to be doing worse clinically; defined by left lower extremity weakness (consistent with nerve compression from spinal metastases), and ongoing weight loss over the past month. Labs performed during her visit with Dr. Alvy Bimler were abnormal and the patient was accordingly sent to the ED and admitted.    To review: PET performed on 11/15/20 showed worsening of FDG uptake at the site of previous disease in the L3 vertebral body, and new activity in the subjacent L4 vertebral body. A new area of nodularity about the RIGHT mid chest along the anteriorpleural surface was also seen, without associated increased metabolic activity.   MRI of the lumbar spine performed on 11/25/20 showed persistent T1 hypointense, enhancing lesion on the left side of the L3 vertebral body with new enhancement in the superior endplate of the L4 vertebral body on the left; notably concerning for metastatic disease.  Patient was evaluated by Dr. Alcario Drought while admitted. Patient  reported to Dr. Alcario Drought pain when trying to lift her left leg, and having to use a walker in the past few days prior to admission. MRI performed during ED course confirmed spinal cord / nerve root compression in lumbar spine by tumor. Dr. Alcario Drought noted that the abnormalities seen on labs drawn during visit with Dr. Alvy Bimler were most likely a lab error.  MRI of the lumbar spine performed during hospital course on 02/12/21 demonstrated again the persistent T1 hypointense enhancing lesion involving the left aspects of the L3 and L4 vertebral bodies, with progressive involvement of the central and right aspects of both vertebral bodies since prior imaging, consistent with progressive disease. Interval increase in size of left paraspinal soft tissue mass at the level of L3-4 was also visualized, with involvement of the adjacent left L3-4 and L4-5 neural foramina.  Of note: patient is currently on therapy consisting of Keytruda under the care of Dr. Alvy Bimler for endometrial cancer.  On the day of her admission to the hospital prior to her admission the patient had undergone a PET scan which showed Hypermetabolic L3 and L4 vertebral metastases are clearly increased in extent since 11/15/2020 PET-CT, with mild variable changes in max SUV levels as detailed. Favor progression of skeletal metastatic disease, as also described on concurrent MRI lumbar spine report.  Of note with PET scan showed no other areas of active disease in the patient's MRI of the cervical and thoracic spine did not show any other sites of involvement.  Radiation therapy has been consulted for consideration for additional treatment in light of her progressive neurologic changes.  The  patient is not a surgical candidate per medical oncology's input.  PREVIOUS RADIATION THERAPY: Yes   --10/22/2016 to 10/28/2016: The L-spine, specifically L3 paraspinal tumor was treated to 27 Gy in 3 fractions at 9 Gy per fraction,  the L3 vertebral body  was not treated (Treated under Dr. Lisbeth Renshaw).   --07/11/14-08/12/14: 55 Gy in 25 fractions to the Lumbar spine (Treated by me)   --04/02/13: Intraoperative radiotherapy to the left pelvis.   --Recurrence in the left pelvis region and received radiation therapy from 10/28/2011 through 12/05/2011, 50.4 gray in 28 fractions (Treated by me)   --November and December 2009 with adjuvant therapy directed at the vaginal cuff. She received 30 gray in 5 fractions (Treated by me)   --2000:  Adjuvant radiotherapy to the right breast, details are unknown  PAST MEDICAL HISTORY:  Past Medical History:  Diagnosis Date   Eczema    Endometrial cancer ([place]) 12/2007   s/p total abdominal hysterectomy at [facility] - ovaries were also removed    Family history of breast cancer    History of breast cancer    right breast -- treated with lumpectomy and radiation therarpy, postoperatively with tamoxifen    History of radiation therapy 10/22/2016 to 10/28/2016   Hypercholesterolemia    Hyperlipidemia    Hypertension    Palpitations    Personal history of radiation therapy 2006   PVC's (premature ventricular contractions)    Radiation 07/11/14-08/12/14   left lumbar paraspinal area 55 gray   S/P radiation therapy 10/28/11-12/05/11   5040 cGy left pelvis   S/P radiation therapy    Intracavitary brachytherapy of uterus   Urticaria     PAST SURGICAL HISTORY: Past Surgical History:  Procedure Laterality Date   ADENOIDECTOMY     APPENDECTOMY     BREAST LUMPECTOMY Right 2000   COLONOSCOPY     3X   FOOT SURGERY     RIGHT   SEGMENTECOMY Right 12/07/2018   Procedure: RIGHT LOWER LOBE SUPERIOR SEGMENTECTOMY;  Surgeon: Melrose Nakayama, MD;  Location: [facility];  Service: Thoracic;  Laterality: Right;   TONSILLECTOMY     TONSILLECTOMY     TOTAL ABDOMINAL HYSTERECTOMY W/ BILATERAL SALPINGOOPHORECTOMY     VIDEO ASSISTED THORACOSCOPY Right 12/07/2018   Procedure: VIDEO ASSISTED THORACOSCOPY;  Surgeon:  Melrose Nakayama, MD;  Location[facility];  Service: Thoracic;  Laterality: Right;    FAMILY HISTORY:  Family History  Problem Relation Age of Onset   Thrombosis Father        coronary thrombosis   Hypertension Father    Heart failure Mother    Asthma Mother    Coronary artery disease Brother    Breast cancer Maternal Aunt        dx in her 17s   Stroke Maternal Uncle    Stroke Maternal Grandfather    Allergic rhinitis Neg Hx    Eczema Neg Hx    Urticaria Neg Hx     SOCIAL HISTORY:  Social History   Tobacco Use   Smoking status: Former    Types: Cigarettes    Quit date: 05/13/1962    Years since quitting: 58.7   Smokeless tobacco: Never  Vaping Use   Vaping Use: Never used  Substance Use Topics   Alcohol use: Yes    Comment: occasionally   Drug use: No    ALLERGIES:  Allergies  Allergen Reactions   Ace Inhibitors Anaphylaxis    Shortness of breath    Dilaudid [Hydromorphone Hcl]  Other (See Comments)    "total loss of her mind"   Codeine Nausea Only    MEDICATIONS:  No current facility-administered medications for this encounter.   No current outpatient medications on file.   Facility-Administered Medications Ordered in Other Encounters  Medication Dose Route Frequency Provider Last Rate Last Admin   atenolol (TENORMIN) tablet 25 mg  25 mg Oral q morning Jennette Kettle M, DO   25 mg at 02/13/21 2841   cholecalciferol (VITAMIN D3) tablet 1,000 Units  1,000 Units Oral q morning Etta Quill, DO   1,000 Units at 02/13/21 0844   dexamethasone (DECADRON) injection 4 mg  4 mg Intravenous Q8H Jennette Kettle M, DO   4 mg at 02/13/21 0513   donepezil (ARICEPT) tablet 5 mg  5 mg Oral QHS Jennette Kettle M, DO   5 mg at 02/12/21 2341   heparin lock flush 100 unit/mL  500 Units Intracatheter Once Heath Lark, MD       hydrALAZINE (APRESOLINE) tablet 50 mg  50 mg Oral BID Etta Quill, DO   50 mg at 02/13/21 0845   levothyroxine (SYNTHROID) tablet 50 mcg  50 mcg  Oral Q0600 Etta Quill, DO   50 mcg at 02/13/21 3244   lidocaine-prilocaine (EMLA) cream 1 application  1 application Topical Daily PRN Etta Quill, DO       liver oil-zinc oxide (DESITIN) 40 % ointment   Topical PRN Kathryne Eriksson, NP   1 application at 05/14/70 0130   loratadine (CLARITIN) tablet 10 mg  10 mg Oral Daily Etta Quill, DO   10 mg at 02/13/21 0843   memantine (NAMENDA) tablet 10 mg  10 mg Oral BID Etta Quill, DO   10 mg at 02/13/21 5366   multivitamin with minerals tablet 1 tablet  1 tablet Oral q morning Etta Quill, DO   1 tablet at 02/13/21 0844   ondansetron (ZOFRAN) tablet 4 mg  4 mg Oral Q6H PRN Etta Quill, DO       Or   ondansetron [facility]) injection 4 mg  4 mg Intravenous Q6H PRN Etta Quill, DO       oxyCODONE (Oxy IR/ROXICODONE) immediate release tablet 10-20 mg  10-20 mg Oral Q4H PRN Etta Quill, DO       rivaroxaban Alveda Reasons) tablet 10 mg  10 mg Oral Daily Jennette Kettle M, DO   10 mg at 02/13/21 0845   simvastatin (ZOCOR) tablet 20 mg  20 mg Oral QPM Jennette Kettle M, DO   20 mg at 02/12/21 2341   sodium chloride flush (NS) 0.9 % injection 10 mL  10 mL Intracatheter Once Heath Lark, MD       vitamin B-12 (CYANOCOBALAMIN) tablet 500 mcg  500 mcg Oral q morning Jennette Kettle M, DO   500 mcg at 02/13/21 4403    REVIEW OF SYSTEMS:  A 10+ POINT REVIEW OF SYSTEMS WAS OBTAINED including neurology, dermatology, psychiatry, cardiac, respiratory, lymph, extremities, GI, GU, musculoskeletal, constitutional, reproductive, HEENT.  The patient reports weakness in her left lower extremity where she relies on a walker for ambulation.  She also complains of numbness primarily in the left thigh region.  She is recently had a Foley catheter placed.  Patient prior to this noticed increasing problems with urinary incontinence.  She does feel that she has sensation within the bladder area prior to catheter placement.  She denies any recent problems  with severe constipation or  fecal incontinence.   PHYSICAL EXAM:  temperature is 97.8 F (36.6 C). Her blood pressure is 151/75 (abnormal) and her pulse is 69. Her respiration is 16.   General: Alert and oriented, in no acute distress, sitting comfortably in a hospital wheelchair HEENT: Head is normocephalic. Extraocular movements are intact.  Neck: Neck is supple, no palpable cervical or supraclavicular lymphadenopathy. Heart: Regular in rate and rhythm with no murmurs, rubs, or gallops. Chest: Clear to auscultation bilaterally, with no rhonchi, wheezes, or rales. Abdomen: Soft, nontender, nondistended, with no rigidity or guarding. Extremities: No cyanosis or edema. Lymphatics: see Neck Exam Skin: No concerning lesions. Musculoskeletal: symmetric strength and muscle tone throughout.  Patient has left lower extremity proximal leg weakness at 3 out of 5 compared to 5 out of 5 on the right.  Distal left lower extremity strength appears to be good.  Right lower extremity strength is 5 out of 5 in the proximal and distal muscle groups. Neurologic: Cranial nerves II through XII are grossly intact. No obvious focalities. Speech is fluent. Coordination is intact. Psychiatric: Judgment and insight are intact. Affect is appropriate.   ECOG = 2  0 - Asymptomatic (Fully active, able to carry on all predisease activities without restriction)  1 - Symptomatic but completely ambulatory (Restricted in physically strenuous activity but ambulatory and able to carry out work of a light or sedentary nature. For example, light housework, office work)  2 - Symptomatic, <50% in bed during the day (Ambulatory and capable of all self care but unable to carry out any work activities. Up and about more than 50% of waking hours)  3 - Symptomatic, >50% in bed, but not bedbound (Capable of only limited self-care, confined to bed or chair 50% or more of waking hours)  4 - Bedbound (Completely disabled. Cannot carry  on any self-care. Totally confined to bed or chair)  5 - Death   Eustace Pen MM, Creech RH, Tormey DC, et al. [phone]). "Toxicity and response criteria of the [facility]". [place] Oncol. 5 (6): 649-55  LABORATORY DATA:  Lab Results  Component Value Date   WBC 4.6 02/13/2021   HGB 11.9 (L) 02/13/2021   HCT 35.5 (L) 02/13/2021   MCV 97.3 02/13/2021   PLT 150 02/13/2021   NEUTROABS 5.3 02/12/2021   Lab Results  Component Value Date   NA 139 02/13/2021   K 3.8 02/13/2021   CL 104 02/13/2021   CO2 25 02/13/2021   GLUCOSE 131 (H) 02/13/2021   CREATININE 0.98 02/13/2021   CALCIUM 8.6 (L) 02/13/2021      RADIOGRAPHY: MR Cervical Spine W or Wo Contrast  Result Date: 02/12/2021 CLINICAL DATA:  Initial evaluation for endometrial carcinoma, metastatic evaluation. Frequent falls. EXAM: MRI CERVICAL SPINE WITHOUT AND WITH CONTRAST TECHNIQUE: Multiplanar and multiecho pulse sequences of the cervical spine, to include the craniocervical junction and cervicothoracic junction, were obtained without and with intravenous contrast. CONTRAST:  64mL GADAVIST GADOBUTROL 1 MMOL/ML IV SOLN COMPARISON:  Comparison made with PET-CT performed earlier the same day. FINDINGS: Alignment: Straightening of the normal cervical lordosis. Grade 1 anterolisthesis of C2 on C3 and C7 on T1, chronic and degenerative. Vertebrae: Vertebral body height maintained without acute or chronic fracture. Bone marrow signal intensity within normal limits. No discrete or worrisome osseous lesions. No evidence for osseous metastatic disease within the cervical spine. No evidence for osteomyelitis discitis or septic arthritis. Mild reactive marrow edema about the left C2-3 facet due to facet arthritis. Cord:  Normal signal and morphology. No epidural or intradural tumor. No abnormal enhancement. Posterior Fossa, vertebral arteries, paraspinal tissues: T2/STIR signal abnormality within the visualized pons, likely related to  chronic microvascular ischemic disease. Visualized brain and posterior fossa otherwise unremarkable. Craniocervical junction normal. Paraspinous and prevertebral soft tissues within normal limits. Normal flow voids seen within the vertebral arteries bilaterally. 5 mm right thyroid nodule noted, of doubtful significance given size and patient age, no follow-up imaging recommended (ref: J Am Coll Radiol. 2015 Feb;12(2): 143-50). Disc levels: C2-C3: Anterolisthesis. Mild disc bulge with prominent left-sided facet degeneration. No spinal stenosis. Foramina remain patent. C3-C4: Degenerative intervertebral disc space narrowing with diffuse disc osteophyte complex. Flattening of the ventral thecal sac with resultant mild spinal stenosis. Moderate right C4 foraminal narrowing. Left neural foramina remains patent. C4-C5: Degenerative intervertebral disc space narrowing with diffuse disc osteophyte complex. Broad posterior component flattens and partially faces the ventral thecal sac. Superimposed facet and ligament flavum hypertrophy. Resultant moderate spinal stenosis with minimal cord flattening, but no cord signal changes. Moderate right C5 foraminal narrowing. Left neural foramina remains patent. C5-C6: Degenerative intervertebral disc space narrowing with diffuse disc osteophyte complex. Broad posterior component flattens and effaces the ventral thecal sac, asymmetric to the right. Resultant moderate spinal stenosis with mild cord flattening, worse on the right. No cord signal changes. Severe right with moderate left C6 foraminal narrowing. C6-C7: Degenerative intervertebral disc space narrowing with diffuse disc osteophyte complex. Flattening and partial effacement of the ventral thecal sac with resultant mild spinal stenosis. Moderate bilateral C7 foraminal narrowing. C7-T1: Anterolisthesis with mild disc bulge. Moderate bilateral facet arthrosis. No significant spinal stenosis. Foramina remain patent. IMPRESSION: 1.  No evidence for metastatic disease within the cervical spine. 2. Moderate multilevel cervical spondylosis with resultant mild to moderate diffuse spinal stenosis at C3-4 through C6-7. 3. Multifactorial degenerative changes with resultant multilevel foraminal narrowing as above. Notable findings include moderate right C4 and C5 foraminal narrowing, severe right with moderate left C6 foraminal stenosis, with moderate bilateral C7 foraminal narrowing. Electronically Signed   By: Jeannine Boga M.D.   On: 02/12/2021 20:43   MR THORACIC SPINE W WO CONTRAST  Result Date: 02/12/2021 CLINICAL DATA:  Initial evaluation for endometrial carcinoma, metastatic evaluation. Frequent falls. EXAM: MRI THORACIC WITHOUT AND WITH CONTRAST TECHNIQUE: Multiplanar and multiecho pulse sequences of the thoracic spine were obtained without and with intravenous contrast. CONTRAST:  51mL GADAVIST GADOBUTROL 1 MMOL/ML IV SOLN COMPARISON:  PET-CT from earlier the same day. FINDINGS: Alignment: Thoracolumbar scoliosis partially visualized. Trace degenerative anterolisthesis of T2 on T3. Vertebrae: Vertebral body height maintained without acute or chronic fracture. Bone marrow signal intensity diffusely heterogeneous. Few scattered benign hemangiomata noted, most prominent of which measures 1.5 cm within the T8 vertebral body. No worrisome osseous lesions. No evidence for osseous metastatic disease. No evidence for osteomyelitis discitis or septic arthritis. Cord: Normal signal and morphology. No epidural or intradural tumor. No abnormal enhancement. Paraspinal and other soft tissues: Paraspinous soft tissues demonstrate no acute or significant finding. Partially visualized lungs are grossly clear. 5 mm right thyroid nodule partially visualized, of doubtful significance given size and patient age, no follow-up imaging recommended (ref: J Am Coll Radiol. 2015 Feb;12(2): 143-50).Remainder the visualized visceral structures demonstrate no  other significant finding. Disc levels: T9-10: Right paracentral disc protrusion indents the right ventral thecal sac (series 26, image 12). Mild flattening of the right ventral cord without cord signal changes or significant spinal stenosis. Otherwise, ordinary for age degenerative disc disease and  facet hypertrophy seen elsewhere throughout the thoracic spine. No other significant spinal stenosis. Foramina remain patent. IMPRESSION: 1. No evidence for metastatic disease within the thoracic spine. 2. Right paracentral disc protrusion at T9-10 with mild flattening of the right hemi cord. No cord signal changes or significant stenosis. Electronically Signed   By: Jeannine Boga M.D.   On: 02/12/2021 21:20   MR Lumbar Spine W Wo Contrast  Result Date: 02/12/2021 CLINICAL DATA:  Initial evaluation for history of endometrial carcinoma, metastatic evaluation, frequent falls. EXAM: MRI LUMBAR SPINE WITHOUT AND WITH CONTRAST TECHNIQUE: Multiplanar and multiecho pulse sequences of the lumbar spine were obtained without and with intravenous contrast. CONTRAST:  28mL GADAVIST GADOBUTROL 1 MMOL/ML IV SOLN COMPARISON:  Prior PET-CT from earlier the same day as well as previous MRI from 11/25/2020. FINDINGS: Segmentation: Standard. Lowest well-formed disc space labeled the L5-S1 level. Alignment: Advanced sigmoid scoliotic curvature of the thoracolumbar spine. 6 mm anterolisthesis of L4 on L5, with trace retrolisthesis of T12 on L1 and L3 on L4. Appearance is stable. Vertebrae: Again seen is abnormal T1 hypointense enhancing lesion involving the left aspect of the L3 and L4 vertebral bodies, concerning for malignancy. Extension to involve the left pedicles and posterior elements of L3 and L4, similar to perhaps mildly progressed. Additionally, abnormal T1 hypointensity now seen extending to involve the central and right aspect of the L3 and L4 vertebral bodies, increased from prior, and suggesting disease progression.  Extraosseous extension with tumor seen obliterating the left L3-4 neural foramen, with extension to partially involve the left L4-5 neural foramen as well (series 1, images 11, 10). Probable early extension superiorly towards the left L2-3 neural foramen as well (series 1, image 13). Evidence for epidural involvement along the left and ventral aspect of the thecal sac extending from L3-4 through L4-5 (series 7, images 25-30). This appears slightly more prominent as compared to previous, with possible intradural extension at the level of L4-5 (series 7, image 29). Additionally, soft tissue density within the left ventral epidural space posterior to L5 is new from prior, and concerning for progressive epidural tumor (series 7, image 31). Soft tissue mass involving the left paraspinous soft tissues at the level of L3-4 again seen, slightly increased in size measuring 2.7 x 1.7 cm (series 7, image 25, previously 2.3 x 1.5 cm when measured in a similar fashion on prior exam). Associated edema and enhancement within the adjacent left psoas muscle without frank soft tissue lesion. Vertebral body height maintained without interval pathologic fracture. Reactive edema about the T12-L1 interspace favored to be degenerative in nature, similar to prior. No other distant sites of osseous metastatic disease identified. Probable postradiation changes noted elsewhere within the lumbar spine. Conus medullaris and cauda equina: Conus extends to the L1-2 level. Conus itself appears normal. Question intradural extension of tumor with involvement of the left-sided nerve roots of the cauda equina at L4-5 (series 7, image 28). Paraspinal and other soft tissues: Slightly increased size of left-sided paraspinous soft tissue mass at L3-4 as above. Associated edema and enhancement within the adjacent left psoas muscle Tiny subcentimeter simple cyst noted within the right kidney. Probable cholelithiasis noted within the partially visualized  gallbladder. Disc levels: T12-L1: Degenerative intervertebral disc space narrowing with disc bulge and reactive endplate spurring, greater on the right. Bilateral facet hypertrophy. No spinal stenosis. Mild right foraminal narrowing. Left neural foramina remains patent. L1-2: Degenerative intervertebral disc space narrowing with disc desiccation and diffuse disc bulge, asymmetric to the right. Associated  right-sided reactive endplate change. Moderate right worse than left facet hypertrophy. Resultant moderate narrowing of the right lateral recess with mild-to-moderate right foraminal narrowing. Central canal and left neural foramina remain patent. L2-3: Degenerative intervertebral disc space narrowing with diffuse disc bulge and disc desiccation. Associated reactive endplate change, greater on the right. Moderate bilateral facet arthrosis. No significant spinal stenosis. Mild right L2 foraminal narrowing. L3-4: Left eccentric disc bulge with reactive endplate change. Moderate left worse than right facet arthrosis. Superimposed tumor obliterates the left neural foramen as above. Resultant severe left lateral recess stenosis and left foraminal narrowing. Mild-to-moderate right foraminal narrowing. L4-5: Anterolisthesis with broad posterior pseudo disc bulge and severe facet arthrosis. New soft tissue density within the left ventral epidural space concerning for possible tumor (series 4, image 31). Resultant moderate canal and right lateral recess stenosis, with progressive severe narrowing of the left lateral recess. Mild right with severe left L4 foraminal stenosis. L5-S1: Disc desiccation with minimal annular bulge. Severe right with moderate left facet arthrosis. No significant spinal stenosis. Foramina remain patent. IMPRESSION: 1. Persistent T1 hypointense enhancing lesion involving the left aspects of the L3 and L4 vertebral bodies, with progressive involvement of the central and right aspects of both vertebral  bodies since prior, consistent with progressive disease. No pathologic fracture. 2. Interval increase in size of left paraspinal soft tissue mass at the level of L3-4, with involvement of the adjacent left L3-4 and L4-5 neural foramina. Epidural involvement involving the left epidural space at L3-4 and L4-5 appears slightly worsened, with new involvement of the left ventral epidural space posterior to L5. Question intradural extension with involvement of the nerve roots of the cauda equina at L4-5 as above. 3. Underlying sigmoid scoliosis with advanced multilevel degenerative spondylosis and facet arthrosis as above, otherwise stable from prior. 4. Cholelithiasis. Electronically Signed   By: Jeannine Boga M.D.   On: 02/12/2021 22:03      IMPRESSION: Endometrial cancer with mets to lumbar spine  The patient presented with worsening left lower extremity weakness and worsening urinary incontinence.  Extensive imaging including PET scan and MRI of the lumbar spine shows progressive disease in L3/L4 area with soft tissue component on the left side of the spine and progressive osseous metastasis at L3 and L4.  I discussed in detail with the patient and her family at a later date that she is already received significant radiation dose to this region.  Unfortunately she has no other options for management of her neurological symptoms.  As above she is not a surgical candidate and she has failed all systemic therapies.  We discussed options at this point discussing maximization of steroid therapy or consideration for additional radiation therapy to the L3/4 area and associated soft tissue component along the left side of the spine.  Patient and her family understand well that  additional radiation therapy as given this is much higher than recommended normal tolerance to tissues along the spine as well as associated nerves in this area.  Further problems could develop with additional radiation therapy including  compression fracture of the spine with worsening neurologic compromise as well as injury to the nerves in this area from excessive radiation therapy.  In addition scarring of the bowel in the associated area could result in potential perforation or blockage requiring emergency surgery.  The left kidney is in close proximity to the anticipated radiation area and potentially could result in worsening of renal function out of her left kidney.  The right kidney  to be well out of the radiation treatment area.  Patient also understands that without any additional treatment the tumor will continue to progress as shown on recent imaging studies.  Given that radiation therapy is the only option at this point to potentially stabilize or improve her neurologic situation she does wish to proceed with additional radiation therapy.  Patient will have targeted based on  her recent PET scan as well as MRIs outlining the areas of active disease.  This will be been treated with fractionated radiation therapy very tight radiation fields and SBRT like positioning for additional accurate targeting of the involved area and limitation of dose to normal surrounding structures.  Anticipate 10 fractions to the area of concern with treatments to begin as soon as possible while in  the hospital.   We discussed the available radiation techniques, and focused on the details of logistics and delivery.  We reviewed the anticipated acute and late sequelae associated with radiation in this setting.  The patient was encouraged to ask questions that I answered to the best of my ability.  A patient consent form was discussed and signed.  We retained a copy for our records.  The patient would like to proceed with radiation and will be scheduled for CT simulation.  PLAN: Patient will proceed with radiation simulation later today.  Anticipate treatment starting on October 5 with 10 fractions planned.   35 minutes of total time was spent for this  patient encounter, including preparation, face-to-face counseling with the patient and coordination of care, physical exam, and documentation of the encounter.   ------------------------------------------------  Blair Promise, PhD, MD  This document serves as a record of services personally performed by Gery Pray, MD. It was created on his behalf by Roney Mans, a trained medical scribe. The creation of this record is based on the scribe's personal observations and the provider's statements to them. This document has been checked and approved by the attending provider.

## 2021-02-13 NOTE — Progress Notes (Signed)
[name]   DOB:April 23, 1941   TI#:[id number]    ASSESSMENT & PLAN:  Endometrial cancer [facility]) PET CT imaging result is pending but based on my assessment and review, she has clear signs of disease progression MRI confirm risk of spinal cord injury The patient had falls recently She has numbness on the left lower extremity and incontinent with uncontrolled pain and recent weight loss I spent a lot of time convincing the patient for urgent admission and she finally agreed Since admission, she felt better with IV dexamethasone I plan to reduce it to twice daily Radiation oncologist has been consulted Will consult palliative care as well The patient is frail and has progressed on multiple lines of treatment She is not a candidate for further systemic treatment We will proceed with palliative radiation therapy while hospitalized   Metastatic cancer to bone [facility]) She is at risk of imminent cord compression I recommend urgent admission, MRI spine as well as IV Decadron Will reduce IV dexamethasone due to improvement in pain control   Recurrent falls while walking She has recurrent falls We will put safety precaution while she is hospitalized We will consult physical therapy while she is here   Cancer associated pain She has poorly controlled pain, improved with IV dexamethasone I recommend IV pain medicine while hospitalized and palliative care consult for assistance in pain management  Diarrhea This is improved.  Monitor closely  Stage II decubitus ulcer We will continue monitoring and wound dressing changes  Goals of care, counseling/discussion The goals of care and admission is fully discussed with the patient and family    Discharge planning She would likely be here until the end of the week  All questions were answered. The patient knows to call the clinic with any problems, questions or concerns.   The total time spent in the appointment was 40 minutes encounter with  patients including review of chart and various tests results, discussions about plan of care and coordination of care plan  Heath Lark, MD 02/13/2021 12:17 PM  Subjective:  She is seen this morning after her appointment at radiation oncology department She had MRI last night Repeat labs improved Her husband and daughter were by the bedside She was found to have decubitus ulcer Foley catheter was placed She has improved energy and appetite She denies pain  Objective:  Vitals:   02/13/21 0045 02/13/21 0536  BP: (!) 172/85 (!) 151/75  Pulse: 78 69  Resp: 15 16  Temp: 98.7 F (37.1 C) 97.8 F (36.6 C)  SpO2: 97%      Intake/Output Summary (Last 24 hours) at 02/13/2021 1217 Last data filed at 02/13/2021 1000 Gross per 24 hour  Intake 100 ml  Output 202 ml  Net -102 ml    GENERAL:alert, no distress and comfortable NEURO: alert & oriented x 3 with fluent speech, no focal motor/sensory deficits   Labs:  Recent Labs    01/23/21 0957 02/12/21 1616 02/12/21 2149 02/13/21 0433  NA 139 139 138 139  K 3.7 3.7 3.4* 3.8  CL 99 102 101 104  CO2 26 21* 24 25  GLUCOSE 90 110* 94 131*  BUN 17 20 20 20   CREATININE 1.06* 0.91 0.91 0.98  CALCIUM 9.7 9.2 9.2 8.6*  GFRNONAA 53* >60 >60 58*  PROT 7.1 6.8 7.0  --   ALBUMIN 3.5 3.4* 3.6  --   AST 51* 41* 39  --   ALT 30 31 28   --   ALKPHOS 53  54 43  --   BILITOT 1.2 0.8 1.1  --     Studies: I have reviewed her imaging studies MR Cervical Spine W or Wo Contrast  Result Date: 02/12/2021 CLINICAL DATA:  Initial evaluation for endometrial carcinoma, metastatic evaluation. Frequent falls. EXAM: MRI CERVICAL SPINE WITHOUT AND WITH CONTRAST TECHNIQUE: Multiplanar and multiecho pulse sequences of the cervical spine, to include the craniocervical junction and cervicothoracic junction, were obtained without and with intravenous contrast. CONTRAST:  67mL GADAVIST GADOBUTROL 1 MMOL/ML IV SOLN COMPARISON:  Comparison made with PET-CT performed  earlier the same day. FINDINGS: Alignment: Straightening of the normal cervical lordosis. Grade 1 anterolisthesis of C2 on C3 and C7 on T1, chronic and degenerative. Vertebrae: Vertebral body height maintained without acute or chronic fracture. Bone marrow signal intensity within normal limits. No discrete or worrisome osseous lesions. No evidence for osseous metastatic disease within the cervical spine. No evidence for osteomyelitis discitis or septic arthritis. Mild reactive marrow edema about the left C2-3 facet due to facet arthritis. Cord: Normal signal and morphology. No epidural or intradural tumor. No abnormal enhancement. Posterior Fossa, vertebral arteries, paraspinal tissues: T2/STIR signal abnormality within the visualized pons, likely related to chronic microvascular ischemic disease. Visualized brain and posterior fossa otherwise unremarkable. Craniocervical junction normal. Paraspinous and prevertebral soft tissues within normal limits. Normal flow voids seen within the vertebral arteries bilaterally. 5 mm right thyroid nodule noted, of doubtful significance given size and patient age, no follow-up imaging recommended (ref: J Am Coll Radiol. 2015 Feb;12(2): 143-50). Disc levels: C2-C3: Anterolisthesis. Mild disc bulge with prominent left-sided facet degeneration. No spinal stenosis. Foramina remain patent. C3-C4: Degenerative intervertebral disc space narrowing with diffuse disc osteophyte complex. Flattening of the ventral thecal sac with resultant mild spinal stenosis. Moderate right C4 foraminal narrowing. Left neural foramina remains patent. C4-C5: Degenerative intervertebral disc space narrowing with diffuse disc osteophyte complex. Broad posterior component flattens and partially faces the ventral thecal sac. Superimposed facet and ligament flavum hypertrophy. Resultant moderate spinal stenosis with minimal cord flattening, but no cord signal changes. Moderate right C5 foraminal narrowing. Left  neural foramina remains patent. C5-C6: Degenerative intervertebral disc space narrowing with diffuse disc osteophyte complex. Broad posterior component flattens and effaces the ventral thecal sac, asymmetric to the right. Resultant moderate spinal stenosis with mild cord flattening, worse on the right. No cord signal changes. Severe right with moderate left C6 foraminal narrowing. C6-C7: Degenerative intervertebral disc space narrowing with diffuse disc osteophyte complex. Flattening and partial effacement of the ventral thecal sac with resultant mild spinal stenosis. Moderate bilateral C7 foraminal narrowing. C7-T1: Anterolisthesis with mild disc bulge. Moderate bilateral facet arthrosis. No significant spinal stenosis. Foramina remain patent. IMPRESSION: 1. No evidence for metastatic disease within the cervical spine. 2. Moderate multilevel cervical spondylosis with resultant mild to moderate diffuse spinal stenosis at C3-4 through C6-7. 3. Multifactorial degenerative changes with resultant multilevel foraminal narrowing as above. Notable findings include moderate right C4 and C5 foraminal narrowing, severe right with moderate left C6 foraminal stenosis, with moderate bilateral C7 foraminal narrowing. Electronically Signed   By: Jeannine Boga M.D.   On: 02/12/2021 20:43   MR THORACIC SPINE W WO CONTRAST  Result Date: 02/12/2021 CLINICAL DATA:  Initial evaluation for endometrial carcinoma, metastatic evaluation. Frequent falls. EXAM: MRI THORACIC WITHOUT AND WITH CONTRAST TECHNIQUE: Multiplanar and multiecho pulse sequences of the thoracic spine were obtained without and with intravenous contrast. CONTRAST:  28mL GADAVIST GADOBUTROL 1 MMOL/ML IV SOLN COMPARISON:  PET-CT from earlier the  same day. FINDINGS: Alignment: Thoracolumbar scoliosis partially visualized. Trace degenerative anterolisthesis of T2 on T3. Vertebrae: Vertebral body height maintained without acute or chronic fracture. Bone marrow signal  intensity diffusely heterogeneous. Few scattered benign hemangiomata noted, most prominent of which measures 1.5 cm within the T8 vertebral body. No worrisome osseous lesions. No evidence for osseous metastatic disease. No evidence for osteomyelitis discitis or septic arthritis. Cord: Normal signal and morphology. No epidural or intradural tumor. No abnormal enhancement. Paraspinal and other soft tissues: Paraspinous soft tissues demonstrate no acute or significant finding. Partially visualized lungs are grossly clear. 5 mm right thyroid nodule partially visualized, of doubtful significance given size and patient age, no follow-up imaging recommended (ref: J Am Coll Radiol. 2015 Feb;12(2): 143-50).Remainder the visualized visceral structures demonstrate no other significant finding. Disc levels: T9-10: Right paracentral disc protrusion indents the right ventral thecal sac (series 26, image 12). Mild flattening of the right ventral cord without cord signal changes or significant spinal stenosis. Otherwise, ordinary for age degenerative disc disease and facet hypertrophy seen elsewhere throughout the thoracic spine. No other significant spinal stenosis. Foramina remain patent. IMPRESSION: 1. No evidence for metastatic disease within the thoracic spine. 2. Right paracentral disc protrusion at T9-10 with mild flattening of the right hemi cord. No cord signal changes or significant stenosis. Electronically Signed   By: Jeannine Boga M.D.   On: 02/12/2021 21:20   MR Lumbar Spine W Wo Contrast  Result Date: 02/12/2021 CLINICAL DATA:  Initial evaluation for history of endometrial carcinoma, metastatic evaluation, frequent falls. EXAM: MRI LUMBAR SPINE WITHOUT AND WITH CONTRAST TECHNIQUE: Multiplanar and multiecho pulse sequences of the lumbar spine were obtained without and with intravenous contrast. CONTRAST:  33mL GADAVIST GADOBUTROL 1 MMOL/ML IV SOLN COMPARISON:  Prior PET-CT from earlier the same day as well  as previous MRI from 11/25/2020. FINDINGS: Segmentation: Standard. Lowest well-formed disc space labeled the L5-S1 level. Alignment: Advanced sigmoid scoliotic curvature of the thoracolumbar spine. 6 mm anterolisthesis of L4 on L5, with trace retrolisthesis of T12 on L1 and L3 on L4. Appearance is stable. Vertebrae: Again seen is abnormal T1 hypointense enhancing lesion involving the left aspect of the L3 and L4 vertebral bodies, concerning for malignancy. Extension to involve the left pedicles and posterior elements of L3 and L4, similar to perhaps mildly progressed. Additionally, abnormal T1 hypointensity now seen extending to involve the central and right aspect of the L3 and L4 vertebral bodies, increased from prior, and suggesting disease progression. Extraosseous extension with tumor seen obliterating the left L3-4 neural foramen, with extension to partially involve the left L4-5 neural foramen as well (series 1, images 11, 10). Probable early extension superiorly towards the left L2-3 neural foramen as well (series 1, image 13). Evidence for epidural involvement along the left and ventral aspect of the thecal sac extending from L3-4 through L4-5 (series 7, images 25-30). This appears slightly more prominent as compared to previous, with possible intradural extension at the level of L4-5 (series 7, image 29). Additionally, soft tissue density within the left ventral epidural space posterior to L5 is new from prior, and concerning for progressive epidural tumor (series 7, image 31). Soft tissue mass involving the left paraspinous soft tissues at the level of L3-4 again seen, slightly increased in size measuring 2.7 x 1.7 cm (series 7, image 25, previously 2.3 x 1.5 cm when measured in a similar fashion on prior exam). Associated edema and enhancement within the adjacent left psoas muscle without frank soft tissue lesion.  Vertebral body height maintained without interval pathologic fracture. Reactive edema about  the T12-L1 interspace favored to be degenerative in nature, similar to prior. No other distant sites of osseous metastatic disease identified. Probable postradiation changes noted elsewhere within the lumbar spine. Conus medullaris and cauda equina: Conus extends to the L1-2 level. Conus itself appears normal. Question intradural extension of tumor with involvement of the left-sided nerve roots of the cauda equina at L4-5 (series 7, image 28). Paraspinal and other soft tissues: Slightly increased size of left-sided paraspinous soft tissue mass at L3-4 as above. Associated edema and enhancement within the adjacent left psoas muscle Tiny subcentimeter simple cyst noted within the right kidney. Probable cholelithiasis noted within the partially visualized gallbladder. Disc levels: T12-L1: Degenerative intervertebral disc space narrowing with disc bulge and reactive endplate spurring, greater on the right. Bilateral facet hypertrophy. No spinal stenosis. Mild right foraminal narrowing. Left neural foramina remains patent. L1-2: Degenerative intervertebral disc space narrowing with disc desiccation and diffuse disc bulge, asymmetric to the right. Associated right-sided reactive endplate change. Moderate right worse than left facet hypertrophy. Resultant moderate narrowing of the right lateral recess with mild-to-moderate right foraminal narrowing. Central canal and left neural foramina remain patent. L2-3: Degenerative intervertebral disc space narrowing with diffuse disc bulge and disc desiccation. Associated reactive endplate change, greater on the right. Moderate bilateral facet arthrosis. No significant spinal stenosis. Mild right L2 foraminal narrowing. L3-4: Left eccentric disc bulge with reactive endplate change. Moderate left worse than right facet arthrosis. Superimposed tumor obliterates the left neural foramen as above. Resultant severe left lateral recess stenosis and left foraminal narrowing.  Mild-to-moderate right foraminal narrowing. L4-5: Anterolisthesis with broad posterior pseudo disc bulge and severe facet arthrosis. New soft tissue density within the left ventral epidural space concerning for possible tumor (series 4, image 31). Resultant moderate canal and right lateral recess stenosis, with progressive severe narrowing of the left lateral recess. Mild right with severe left L4 foraminal stenosis. L5-S1: Disc desiccation with minimal annular bulge. Severe right with moderate left facet arthrosis. No significant spinal stenosis. Foramina remain patent. IMPRESSION: 1. Persistent T1 hypointense enhancing lesion involving the left aspects of the L3 and L4 vertebral bodies, with progressive involvement of the central and right aspects of both vertebral bodies since prior, consistent with progressive disease. No pathologic fracture. 2. Interval increase in size of left paraspinal soft tissue mass at the level of L3-4, with involvement of the adjacent left L3-4 and L4-5 neural foramina. Epidural involvement involving the left epidural space at L3-4 and L4-5 appears slightly worsened, with new involvement of the left ventral epidural space posterior to L5. Question intradural extension with involvement of the nerve roots of the cauda equina at L4-5 as above. 3. Underlying sigmoid scoliosis with advanced multilevel degenerative spondylosis and facet arthrosis as above, otherwise stable from prior. 4. Cholelithiasis. Electronically Signed   By: Jeannine Boga M.D.   On: 02/12/2021 22:03

## 2021-02-13 NOTE — Progress Notes (Signed)
PROGRESS NOTE  [name]  LOV:[id number] DOB: 10-07-1940 DOA: 02/12/2021 PCP: Shon Baton, MD  Outpatient Specialists: Oncology, Dr. Alvy Bimler Brief Narrative: [name] is an 80 y.o. female with a history of endometrial cancer with lumbar metastasis with recent scans demonstrating suspected cord compression and was referred to the ED by her oncologist due to progressive lower extremity symptoms. Plan is to initiate radiation therapy as inpatient.  Assessment & Plan: Principal Problem:   Lumbar cord compression (HCC) Active Problems:   Endometrial cancer (HCC)   Essential hypertension   Deep vein blood clot of right lower extremity (HCC)   Metastatic cancer to spine (HCC)   Pressure injury of skin  Endometrial cancer with bony mets:  - Dr. Alvy Bimler following in hospital. With signs of disease progression and not a candidate for further systemic treatment.  Radiculopathy due to nerve root compression by lumbar spinal metastases:  - Continue decadron, dose reduced per oncology. Otherwise, pain control with oxycodone as ordered. Palliative care consulted to assist with pain management. - Radiation oncology consulted, planning radiation as inpatient - PT evaluation requested  Hx RLE DVT:  - Continue xarelto  HTN:  - Continue atenolol, hydralazine  - Hold lasix for now  Cognitive impairment:  - Continue aricept, namenda  Hypothyroidism:  - Continue synthroid.   Hypokalemia:  - Resolved with supplementation - Hold lasix  Hypomagnesemia:  - Supplemented  HLD:  - Continue statin  Stage 2 right and stage 1 left buttock pressure ulcers, POA:  - Offload as able, optimize nutrition  Perineal MASD:  - Desitin  Anemia of chronic disease, malignancy:  - Monitor  DVT prophylaxis: Xarelto Code Status: Full Family Communication: None at bedside Disposition Plan:  Status is: Observation  The patient will require care spanning > 2 midnights and should be moved to  inpatient because: Inpatient level of care appropriate due to severity of illness due to risk of spinal cord injury  Dispo: The patient is from: Home              Anticipated d/c is to:  TBD              Patient currently is not medically stable to d/c.  Consultants:  Oncology Radiation oncology Palliative care medicine team  Procedures:  Radiation  Antimicrobials: None   Subjective: Pain improved in low back. Slept better once moved to room at 1am.   Objective: Vitals:   02/13/21 0000 02/13/21 0045 02/13/21 0536 02/13/21 1247  BP:  (!) 172/85 (!) 151/75 (!) 147/76  Pulse:  78 69 77  Resp:  15 16 18   Temp:  98.7 F (37.1 C) 97.8 F (36.6 C) 98.3 F (36.8 C)  TempSrc:  Oral Oral Oral  SpO2:  97%  97%  Weight: 47.5 kg     Height: 4\' 11"  (1.499 m)       Intake/Output Summary (Last 24 hours) at 02/13/2021 1309 Last data filed at 02/13/2021 1000 Gross per 24 hour  Intake 100 ml  Output 202 ml  Net -102 ml   Filed Weights   02/13/21 0000  Weight: 47.5 kg    Gen: Thin elderly female in no distress Pulm: Non-labored breathing room air. Clear to auscultation bilaterally.  CV: Regular rate and rhythm. No murmur, rub, or gallop. No JVD, 1+ pitting bilateral LE edema. GI: Abdomen soft, non-tender, non-distended, with normoactive bowel sounds. No organomegaly or masses felt. Ext: Warm, no deformities Skin: No rashes, lesions or ulcers on visualized  skin. Did not examine perineum/buttocks this morning.  Neuro: Alert and oriented. Difficulty moving LLE > RLE. Psych: Judgement and insight appear marginal. Mood & affect appropriate.   Data Reviewed: I have personally reviewed following labs and imaging studies  CBC: Recent Labs  Lab 02/12/21 1616 02/13/21 0433  WBC 7.4 4.6  NEUTROABS 5.3  --   HGB 13.1 11.9*  HCT 39.5 35.5*  MCV 96.8 97.3  PLT 193 500   Basic Metabolic Panel: Recent Labs  Lab 02/12/21 1616 02/12/21 2108 02/12/21 2149 02/13/21 0433  NA 139   --  138 139  K 3.7  --  3.4* 3.8  CL 102  --  101 104  CO2 21*  --  24 25  GLUCOSE 110*  --  94 131*  BUN 20  --  20 20  CREATININE 0.91  --  0.91 0.98  CALCIUM 9.2  --  9.2 8.6*  MG  --  1.5*  --   --    GFR: Estimated Creatinine Clearance: 30.7 mL/min (by C-G formula based on SCr of 0.98 mg/dL). Liver Function Tests: Recent Labs  Lab 02/12/21 1616 02/12/21 2149  AST 41* 39  ALT 31 28  ALKPHOS 54 43  BILITOT 0.8 1.1  PROT 6.8 7.0  ALBUMIN 3.4* 3.6   No results for input(s): LIPASE, AMYLASE in the last 168 hours. No results for input(s): AMMONIA in the last 168 hours. Coagulation Profile: No results for input(s): INR, PROTIME in the last 168 hours. Cardiac Enzymes: No results for input(s): CKTOTAL, CKMB, CKMBINDEX, TROPONINI in the last 168 hours. BNP (last 3 results) No results for input(s): PROBNP in the last 8760 hours. HbA1C: No results for input(s): HGBA1C in the last 72 hours. CBG: Recent Labs  Lab 02/12/21 1354  GLUCAP 105*   Lipid Profile: No results for input(s): CHOL, HDL, LDLCALC, TRIG, CHOLHDL, LDLDIRECT in the last 72 hours. Thyroid Function Tests: Recent Labs    02/12/21 1616  TSH 4.034*   Anemia Panel: No results for input(s): VITAMINB12, FOLATE, FERRITIN, TIBC, IRON, RETICCTPCT in the last 72 hours. Urine analysis:    Component Value Date/Time   COLORURINE YELLOW 07/26/2019 0843   APPEARANCEUR CLEAR 07/26/2019 0843   LABSPEC 1.013 07/26/2019 0843   LABSPEC 1.020 05/18/2013 0949   PHURINE 5.0 07/26/2019 0843   GLUCOSEU NEGATIVE 07/26/2019 0843   GLUCOSEU Negative 05/18/2013 0949   HGBUR SMALL (A) 07/26/2019 0843   BILIRUBINUR NEGATIVE 07/26/2019 0843   BILIRUBINUR Negative 05/18/2013 0949   KETONESUR NEGATIVE 07/26/2019 0843   PROTEINUR 30 (A) 01/23/2021 0955   UROBILINOGEN 0.2 05/18/2013 0949   NITRITE NEGATIVE 07/26/2019 0843   LEUKOCYTESUR TRACE (A) 07/26/2019 0843   LEUKOCYTESUR Large 05/18/2013 0949   Recent Results (from the  past 240 hour(s))  Resp Panel by RT-PCR (Flu A&B, Covid) Nasopharyngeal Swab     Status: None   Collection Time: 02/12/21  8:48 PM   Specimen: Nasopharyngeal Swab; Nasopharyngeal(NP) swabs in vial transport medium  Result Value Ref Range Status   SARS Coronavirus 2 by RT PCR NEGATIVE NEGATIVE Final    Comment: (NOTE) SARS-CoV-2 target nucleic acids are NOT DETECTED.  The SARS-CoV-2 RNA is generally detectable in upper respiratory specimens during the acute phase of infection. The lowest concentration of SARS-CoV-2 viral copies this assay can detect is 138 copies/mL. A negative result does not preclude SARS-Cov-2 infection and should not be used as the sole basis for treatment or other patient management decisions. A negative result may occur  with  improper specimen collection/handling, submission of specimen other than nasopharyngeal swab, presence of viral mutation(s) within the areas targeted by this assay, and inadequate number of viral copies(<138 copies/mL). A negative result must be combined with clinical observations, patient history, and epidemiological information. The expected result is Negative.  Fact Sheet for Patients:  EntrepreneurPulse.com.au  Fact Sheet for Healthcare Providers:  IncredibleEmployment.be  This test is no t yet approved or cleared by the [place] FDA and  has been authorized for detection and/or diagnosis of SARS-CoV-2 by FDA under an Emergency Use Authorization (EUA). This EUA will remain  in effect (meaning this test can be used) for the duration of the COVID-19 declaration under Section 564(b)(1) of the Act, 21 U.S.C.section 360bbb-3(b)(1), unless the authorization is terminated  or revoked sooner.       Influenza A by PCR NEGATIVE NEGATIVE Final   Influenza B by PCR NEGATIVE NEGATIVE Final    Comment: (NOTE) The Xpert Xpress SARS-CoV-2/FLU/RSV plus assay is intended as an aid in the diagnosis of  influenza from Nasopharyngeal swab specimens and should not be used as a sole basis for treatment. Nasal washings and aspirates are unacceptable for Xpert Xpress SARS-CoV-2/FLU/RSV testing.  Fact Sheet for Patients: EntrepreneurPulse.com.au  Fact Sheet for Healthcare Providers: IncredibleEmployment.be  This test is not yet approved or cleared by the [place] FDA and has been authorized for detection and/or diagnosis of SARS-CoV-2 by FDA under an Emergency Use Authorization (EUA). This EUA will remain in effect (meaning this test can be used) for the duration of the COVID-19 declaration under Section 564(b)(1) of the Act, 21 U.S.C. section 360bbb-3(b)(1), unless the authorization is terminated or revoked.  Performed at [facility], [place] [address] [place], [place] [address]       Radiology Studies: MR Cervical Spine W or Wo Contrast  Result Date: 02/12/2021 CLINICAL DATA:  Initial evaluation for endometrial carcinoma, metastatic evaluation. Frequent falls. EXAM: MRI CERVICAL SPINE WITHOUT AND WITH CONTRAST TECHNIQUE: Multiplanar and multiecho pulse sequences of the cervical spine, to include the craniocervical junction and cervicothoracic junction, were obtained without and with intravenous contrast. CONTRAST:  47mL GADAVIST GADOBUTROL 1 MMOL/ML IV SOLN COMPARISON:  Comparison made with PET-CT performed earlier the same day. FINDINGS: Alignment: Straightening of the normal cervical lordosis. Grade 1 anterolisthesis of C2 on C3 and C7 on T1, chronic and degenerative. Vertebrae: Vertebral body height maintained without acute or chronic fracture. Bone marrow signal intensity within normal limits. No discrete or worrisome osseous lesions. No evidence for osseous metastatic disease within the cervical spine. No evidence for osteomyelitis discitis or septic arthritis. Mild reactive marrow edema about the left C2-3 facet due to facet  arthritis. Cord: Normal signal and morphology. No epidural or intradural tumor. No abnormal enhancement. Posterior Fossa, vertebral arteries, paraspinal tissues: T2/STIR signal abnormality within the visualized pons, likely related to chronic microvascular ischemic disease. Visualized brain and posterior fossa otherwise unremarkable. Craniocervical junction normal. Paraspinous and prevertebral soft tissues within normal limits. Normal flow voids seen within the vertebral arteries bilaterally. 5 mm right thyroid nodule noted, of doubtful significance given size and patient age, no follow-up imaging recommended (ref: J Am Coll Radiol. 2015 Feb;12(2): 143-50). Disc levels: C2-C3: Anterolisthesis. Mild disc bulge with prominent left-sided facet degeneration. No spinal stenosis. Foramina remain patent. C3-C4: Degenerative intervertebral disc space narrowing with diffuse disc osteophyte complex. Flattening of the ventral thecal sac with resultant mild spinal stenosis. Moderate right C4 foraminal narrowing. Left neural foramina remains patent. C4-C5: Degenerative  intervertebral disc space narrowing with diffuse disc osteophyte complex. Broad posterior component flattens and partially faces the ventral thecal sac. Superimposed facet and ligament flavum hypertrophy. Resultant moderate spinal stenosis with minimal cord flattening, but no cord signal changes. Moderate right C5 foraminal narrowing. Left neural foramina remains patent. C5-C6: Degenerative intervertebral disc space narrowing with diffuse disc osteophyte complex. Broad posterior component flattens and effaces the ventral thecal sac, asymmetric to the right. Resultant moderate spinal stenosis with mild cord flattening, worse on the right. No cord signal changes. Severe right with moderate left C6 foraminal narrowing. C6-C7: Degenerative intervertebral disc space narrowing with diffuse disc osteophyte complex. Flattening and partial effacement of the ventral thecal  sac with resultant mild spinal stenosis. Moderate bilateral C7 foraminal narrowing. C7-T1: Anterolisthesis with mild disc bulge. Moderate bilateral facet arthrosis. No significant spinal stenosis. Foramina remain patent. IMPRESSION: 1. No evidence for metastatic disease within the cervical spine. 2. Moderate multilevel cervical spondylosis with resultant mild to moderate diffuse spinal stenosis at C3-4 through C6-7. 3. Multifactorial degenerative changes with resultant multilevel foraminal narrowing as above. Notable findings include moderate right C4 and C5 foraminal narrowing, severe right with moderate left C6 foraminal stenosis, with moderate bilateral C7 foraminal narrowing. Electronically Signed   By: Jeannine Boga M.D.   On: 02/12/2021 20:43   MR THORACIC SPINE W WO CONTRAST  Result Date: 02/12/2021 CLINICAL DATA:  Initial evaluation for endometrial carcinoma, metastatic evaluation. Frequent falls. EXAM: MRI THORACIC WITHOUT AND WITH CONTRAST TECHNIQUE: Multiplanar and multiecho pulse sequences of the thoracic spine were obtained without and with intravenous contrast. CONTRAST:  34mL GADAVIST GADOBUTROL 1 MMOL/ML IV SOLN COMPARISON:  PET-CT from earlier the same day. FINDINGS: Alignment: Thoracolumbar scoliosis partially visualized. Trace degenerative anterolisthesis of T2 on T3. Vertebrae: Vertebral body height maintained without acute or chronic fracture. Bone marrow signal intensity diffusely heterogeneous. Few scattered benign hemangiomata noted, most prominent of which measures 1.5 cm within the T8 vertebral body. No worrisome osseous lesions. No evidence for osseous metastatic disease. No evidence for osteomyelitis discitis or septic arthritis. Cord: Normal signal and morphology. No epidural or intradural tumor. No abnormal enhancement. Paraspinal and other soft tissues: Paraspinous soft tissues demonstrate no acute or significant finding. Partially visualized lungs are grossly clear. 5 mm  right thyroid nodule partially visualized, of doubtful significance given size and patient age, no follow-up imaging recommended (ref: J Am Coll Radiol. 2015 Feb;12(2): 143-50).Remainder the visualized visceral structures demonstrate no other significant finding. Disc levels: T9-10: Right paracentral disc protrusion indents the right ventral thecal sac (series 26, image 12). Mild flattening of the right ventral cord without cord signal changes or significant spinal stenosis. Otherwise, ordinary for age degenerative disc disease and facet hypertrophy seen elsewhere throughout the thoracic spine. No other significant spinal stenosis. Foramina remain patent. IMPRESSION: 1. No evidence for metastatic disease within the thoracic spine. 2. Right paracentral disc protrusion at T9-10 with mild flattening of the right hemi cord. No cord signal changes or significant stenosis. Electronically Signed   By: Jeannine Boga M.D.   On: 02/12/2021 21:20   MR Lumbar Spine W Wo Contrast  Result Date: 02/12/2021 CLINICAL DATA:  Initial evaluation for history of endometrial carcinoma, metastatic evaluation, frequent falls. EXAM: MRI LUMBAR SPINE WITHOUT AND WITH CONTRAST TECHNIQUE: Multiplanar and multiecho pulse sequences of the lumbar spine were obtained without and with intravenous contrast. CONTRAST:  58mL GADAVIST GADOBUTROL 1 MMOL/ML IV SOLN COMPARISON:  Prior PET-CT from earlier the same day as well as previous MRI from  11/25/2020. FINDINGS: Segmentation: Standard. Lowest well-formed disc space labeled the L5-S1 level. Alignment: Advanced sigmoid scoliotic curvature of the thoracolumbar spine. 6 mm anterolisthesis of L4 on L5, with trace retrolisthesis of T12 on L1 and L3 on L4. Appearance is stable. Vertebrae: Again seen is abnormal T1 hypointense enhancing lesion involving the left aspect of the L3 and L4 vertebral bodies, concerning for malignancy. Extension to involve the left pedicles and posterior elements of L3  and L4, similar to perhaps mildly progressed. Additionally, abnormal T1 hypointensity now seen extending to involve the central and right aspect of the L3 and L4 vertebral bodies, increased from prior, and suggesting disease progression. Extraosseous extension with tumor seen obliterating the left L3-4 neural foramen, with extension to partially involve the left L4-5 neural foramen as well (series 1, images 11, 10). Probable early extension superiorly towards the left L2-3 neural foramen as well (series 1, image 13). Evidence for epidural involvement along the left and ventral aspect of the thecal sac extending from L3-4 through L4-5 (series 7, images 25-30). This appears slightly more prominent as compared to previous, with possible intradural extension at the level of L4-5 (series 7, image 29). Additionally, soft tissue density within the left ventral epidural space posterior to L5 is new from prior, and concerning for progressive epidural tumor (series 7, image 31). Soft tissue mass involving the left paraspinous soft tissues at the level of L3-4 again seen, slightly increased in size measuring 2.7 x 1.7 cm (series 7, image 25, previously 2.3 x 1.5 cm when measured in a similar fashion on prior exam). Associated edema and enhancement within the adjacent left psoas muscle without frank soft tissue lesion. Vertebral body height maintained without interval pathologic fracture. Reactive edema about the T12-L1 interspace favored to be degenerative in nature, similar to prior. No other distant sites of osseous metastatic disease identified. Probable postradiation changes noted elsewhere within the lumbar spine. Conus medullaris and cauda equina: Conus extends to the L1-2 level. Conus itself appears normal. Question intradural extension of tumor with involvement of the left-sided nerve roots of the cauda equina at L4-5 (series 7, image 28). Paraspinal and other soft tissues: Slightly increased size of left-sided  paraspinous soft tissue mass at L3-4 as above. Associated edema and enhancement within the adjacent left psoas muscle Tiny subcentimeter simple cyst noted within the right kidney. Probable cholelithiasis noted within the partially visualized gallbladder. Disc levels: T12-L1: Degenerative intervertebral disc space narrowing with disc bulge and reactive endplate spurring, greater on the right. Bilateral facet hypertrophy. No spinal stenosis. Mild right foraminal narrowing. Left neural foramina remains patent. L1-2: Degenerative intervertebral disc space narrowing with disc desiccation and diffuse disc bulge, asymmetric to the right. Associated right-sided reactive endplate change. Moderate right worse than left facet hypertrophy. Resultant moderate narrowing of the right lateral recess with mild-to-moderate right foraminal narrowing. Central canal and left neural foramina remain patent. L2-3: Degenerative intervertebral disc space narrowing with diffuse disc bulge and disc desiccation. Associated reactive endplate change, greater on the right. Moderate bilateral facet arthrosis. No significant spinal stenosis. Mild right L2 foraminal narrowing. L3-4: Left eccentric disc bulge with reactive endplate change. Moderate left worse than right facet arthrosis. Superimposed tumor obliterates the left neural foramen as above. Resultant severe left lateral recess stenosis and left foraminal narrowing. Mild-to-moderate right foraminal narrowing. L4-5: Anterolisthesis with broad posterior pseudo disc bulge and severe facet arthrosis. New soft tissue density within the left ventral epidural space concerning for possible tumor (series 4, image 31). Resultant moderate canal  and right lateral recess stenosis, with progressive severe narrowing of the left lateral recess. Mild right with severe left L4 foraminal stenosis. L5-S1: Disc desiccation with minimal annular bulge. Severe right with moderate left facet arthrosis. No significant  spinal stenosis. Foramina remain patent. IMPRESSION: 1. Persistent T1 hypointense enhancing lesion involving the left aspects of the L3 and L4 vertebral bodies, with progressive involvement of the central and right aspects of both vertebral bodies since prior, consistent with progressive disease. No pathologic fracture. 2. Interval increase in size of left paraspinal soft tissue mass at the level of L3-4, with involvement of the adjacent left L3-4 and L4-5 neural foramina. Epidural involvement involving the left epidural space at L3-4 and L4-5 appears slightly worsened, with new involvement of the left ventral epidural space posterior to L5. Question intradural extension with involvement of the nerve roots of the cauda equina at L4-5 as above. 3. Underlying sigmoid scoliosis with advanced multilevel degenerative spondylosis and facet arthrosis as above, otherwise stable from prior. 4. Cholelithiasis. Electronically Signed   By: Jeannine Boga M.D.   On: 02/12/2021 22:03    Scheduled Meds:  atenolol  25 mg Oral q morning   cholecalciferol  1,000 Units Oral q morning   dexamethasone (DECADRON) injection  4 mg Intravenous Q12H   donepezil  5 mg Oral QHS   hydrALAZINE  50 mg Oral BID   levothyroxine  50 mcg Oral Q0600   loratadine  10 mg Oral Daily   memantine  10 mg Oral BID   multivitamin with minerals  1 tablet Oral q morning   rivaroxaban  10 mg Oral Daily   simvastatin  20 mg Oral QPM   vitamin B-12  500 mcg Oral q morning   Continuous Infusions:   LOS: 0 days   Time spent: 25 minutes.  Patrecia Pour, MD [facility] [email or web address] 02/13/2021, 1:09 PM

## 2021-02-13 NOTE — Progress Notes (Signed)
Histology and Location of Primary Cancer: She has recurrent papillary serous endometrial carcinoma. Briefly she was diagnosed in 2009 with a FIGO IB pappilary serous carcinoma treated with hysterectomy followed by adjuvant chemo and vaginal brachytherapy. She then had a recurrence diagnosed in late 2012 that was deemed not to be surgically resectable, and was treated with 6 cycles of carbo/taxol followed by 5040 cGy of EBRT to the pelvic mass. She was then followed with serial imaging and was noted in June of this year to have slight increase in the size of the mass, and again in September there was small enlargement of the mass. She had a re-staging PET scan on 03/10/13 that showed FDG activity in the pelvic mass, with no other areas of disease  Sites of Visceral and Bony Metastatic Disease: lumbar spine  Location(s) of Symptomatic Metastases: lumbar spine  Pain on a scale of 0-10 is: 6/10, tolerable pain to lower left back and occasionally in left knee    Ambulatory status? Walker? Wheelchair?: Using walker due to increased pain, using wheelchair while inpatient.  SAFETY ISSUES: Prior radiation? yes Pacemaker/ICD? no Possible current pregnancy? no, hysterectomy Is the patient on methotrexate? no  Current Complaints / other details:  Unsteady gait, recent fall, lower left back and occasionally in left knee  Vitals:   02/13/21 1032  BP: (!) 151/75  Pulse: 69  Resp: 16  Temp: 97.8 F (36.6 C)

## 2021-02-14 ENCOUNTER — Ambulatory Visit
Admit: 2021-02-14 | Discharge: 2021-02-14 | Disposition: A | Discharge status: Home / Self Care | Payer: Medicare Other | Attending: Radiation Oncology | Admitting: Radiation Oncology

## 2021-02-14 ENCOUNTER — Ambulatory Visit: Payer: Medicare Other | Admitting: Rehabilitation

## 2021-02-14 DIAGNOSIS — K5903 Drug induced constipation: Secondary | ICD-10-CM | POA: Diagnosis not present

## 2021-02-14 DIAGNOSIS — C7951 Secondary malignant neoplasm of bone: Secondary | ICD-10-CM

## 2021-02-14 DIAGNOSIS — E039 Hypothyroidism, unspecified: Secondary | ICD-10-CM | POA: Diagnosis not present

## 2021-02-14 DIAGNOSIS — C541 Malignant neoplasm of endometrium: Secondary | ICD-10-CM | POA: Diagnosis not present

## 2021-02-14 DIAGNOSIS — D638 Anemia in other chronic diseases classified elsewhere: Secondary | ICD-10-CM

## 2021-02-14 DIAGNOSIS — Z515 Encounter for palliative care: Secondary | ICD-10-CM

## 2021-02-14 DIAGNOSIS — I700 Atherosclerosis of aorta: Secondary | ICD-10-CM | POA: Diagnosis not present

## 2021-02-14 DIAGNOSIS — F0390 Unspecified dementia without behavioral disturbance: Secondary | ICD-10-CM | POA: Diagnosis present

## 2021-02-14 DIAGNOSIS — G893 Neoplasm related pain (acute) (chronic): Secondary | ICD-10-CM | POA: Diagnosis not present

## 2021-02-14 DIAGNOSIS — E876 Hypokalemia: Secondary | ICD-10-CM | POA: Diagnosis present

## 2021-02-14 DIAGNOSIS — E43 Unspecified severe protein-calorie malnutrition: Secondary | ICD-10-CM

## 2021-02-14 DIAGNOSIS — I679 Cerebrovascular disease, unspecified: Secondary | ICD-10-CM

## 2021-02-14 DIAGNOSIS — G9520 Unspecified cord compression: Secondary | ICD-10-CM | POA: Diagnosis not present

## 2021-02-14 DIAGNOSIS — L89302 Pressure ulcer of unspecified buttock, stage 2: Secondary | ICD-10-CM | POA: Diagnosis present

## 2021-02-14 DIAGNOSIS — T402X5A Adverse effect of other opioids, initial encounter: Secondary | ICD-10-CM

## 2021-02-14 MED ORDER — SODIUM CHLORIDE 0.9% FLUSH
10.0000 mL | INTRAVENOUS | Status: DC | PRN
  Administered 2021-02-16: 10 mL

## 2021-02-14 MED ORDER — SODIUM CHLORIDE 0.9% FLUSH
10.0000 mL | Freq: Two times a day (BID) | INTRAVENOUS | Status: DC
  Administered 2021-02-14 – 2021-02-16 (×4): 10 mL

## 2021-02-14 MED ORDER — DIPHENOXYLATE-ATROPINE 2.5-0.025 MG PO TABS: 1.0000 | ORAL_TABLET | Freq: Four times a day (QID) | ORAL | Status: DC | PRN

## 2021-02-14 MED ORDER — CHLORHEXIDINE GLUCONATE 0.12 % MT SOLN
15.0000 mL | Freq: Two times a day (BID) | OROMUCOSAL | Status: DC
  Administered 2021-02-14 – 2021-02-16 (×5): 15 mL via OROMUCOSAL
  Filled 2021-02-14 (×4): qty 15

## 2021-02-14 MED ORDER — ENSURE ENLIVE PO LIQD
237.0000 mL | Freq: Three times a day (TID) | ORAL | Status: DC
  Administered 2021-02-14 – 2021-02-16 (×5): 237 mL via ORAL

## 2021-02-14 MED ORDER — CHLORHEXIDINE GLUCONATE CLOTH 2 % EX PADS
6.0000 | MEDICATED_PAD | Freq: Every day | CUTANEOUS | Status: DC
  Administered 2021-02-14 – 2021-02-16 (×3): 6 via TOPICAL

## 2021-02-14 MED ORDER — LOPERAMIDE HCL 2 MG PO CAPS
4.0000 mg | ORAL_CAPSULE | Freq: Three times a day (TID) | ORAL | Status: DC
  Administered 2021-02-14 – 2021-02-16 (×7): 4 mg via ORAL
  Filled 2021-02-14 (×7): qty 2

## 2021-02-14 NOTE — Progress Notes (Signed)
  Progress Note    [name]   AVW:[id number]  DOB: 10-21-1940  DOA: 02/12/2021     1 Date of Service: 02/14/2021    Brief summary: [name] is an 80 y.o. F with history DVT on Xarelto, endometrial cancer, progression despite second line treatment, now metastatic to the spine who presented to oncology clinic with left lower extremity numbness, incontinence, and low back pain as well as weight loss and was direct admitted for emergent palliative radiation therapy.   10/3 direct admitted, started on dexamethasone 10/4 rad Onc consulted 10/5 started radiation today     Subjective:  Her leg pain is fairly well controlled.  She has no fever, chest pain.  Her dementia is unchanged.  Hospital Problems * Lumbar cord compression (HCC) Able to ambulate - Insert foley for incontinence - PT eval for leg weakness - Continue Dexamethasone - Continue palliative radiation therapy - PRN oxycodone  Metastatic cancer to spine ([place])  See above  Endometrial cancer ([place]) - COnsult Oncology - Consult Palliative care  Deep vein blood clot of right lower extremity (HCC) - Continue Xarelto  Hypomagnesemia - Check mag tomorrow  Hypokalemia Supplemented and resolved  Dementia without behavioral disturbance (HCC) - Continue memantine, donepezil  Pressure injury of buttock, stage 2 (HCC) - Consult wound care  Anemia, chronic disease Hgb stable relative to abseline  Acquired hypothyroidism -Continue levothyroxine  Cerebrovascular disease - Continue simvastatin, atenolol  Essential hypertension Blood pressure elevated - Continue atenolol, hydralazine    Aortic atherosclerosis (HCC) - Continue simvastatin, atenolol     Objective Vital signs reviewed notable for elevated blood pressure slightly BP (!) 143/82 (BP Location: Left Arm)   Pulse 65   Temp 97.9 F (36.6 C)   Resp 16   Ht 4\' 11"  (1.499 m)   Wt 47.5 kg   SpO2 98%   BMI 21.15 kg/m      Exam General  appearance: Thin elderly female, ambulating back from the bathroom.     HEENT:    Skin:  Cardiac: RRR, no murmurs, 1+ lower extremity edema Respiratory: Normal respiratory rate and rhythm, lungs clear without rales or wheezes Abdomen:   MSK: No deformities.  Moderate loss of subcutaneous muscle mass. Neuro: Awake, shuffling gait, answers questions appropriately, memory seems moderately impaired, upper extremity strength is normal.  Speech fluent. Psych: Attention normal, affect appropriate, judgment insight appear moderately impaired    Labs / Other Information There are no new results to review at this time.    Time spent: 25 minutes [facility] 02/14/2021, 3:36 PM

## 2021-02-14 NOTE — Assessment & Plan Note (Signed)
-   Supplemented and resolved 

## 2021-02-14 NOTE — Assessment & Plan Note (Signed)
Able to ambulate - Insert foley for incontinence - PT eval for leg weakness - Continue Dexamethasone - Continue palliative radiation therapy - PRN oxycodone

## 2021-02-14 NOTE — Hospital Course (Addendum)
[name] is an 80 y.o. F with history DVT on Xarelto, endometrial cancer, progression despite second line treatment, now metastatic to the spine who presented to oncology clinic with left lower extremity numbness, incontinence, and low back pain as well as weight loss and was direct admitted for emergent palliative radiation therapy.   10/3 direct admitted, started on dexamethasone 10/4 rad Onc consulted 10/5 started radiation this day

## 2021-02-14 NOTE — Assessment & Plan Note (Signed)
-   Continue memantine, donepezil

## 2021-02-14 NOTE — Assessment & Plan Note (Signed)
-   Check mag tomorrow

## 2021-02-14 NOTE — Assessment & Plan Note (Signed)
-   Continue simvastatin, atenolol

## 2021-02-14 NOTE — Consult Note (Signed)
Consultation Note Date: 02/14/2021   Patient Name: [name]  DOB: April 13, 1941  MRN: [id number]  Age / Sex: 80 y.o., female  PCP: Shon Baton, MD Referring Physician: Edwin Dada, *  Reason for Consultation: Pain control  HPI/Patient Profile: 80 y.o. female  with past medical history of DVT on Xarelto, endometrial cancer with progression despite second line treatment with metastasis to spine admitted on 02/12/2021 with increased pain, incontinence and left lower extremity numbness.  She is undergoing emergent palliative radiation therapy.  Palliative care consulted for pain management  Clinical Assessment and Goals of Care: Chart reviewed including personal review of pertinent labs and imaging.  Reviewed PDMP and only current home medication is oxycodone 5 mg which was last filled on 9/13.  I met today with [name], her husband, [name], and her daughter, Jerene Pitch.  We discussed her pain in detail.  She reports that prior to admission she was having severe pain in her back and some radiation into the leg but this is improved greatly after being admitted and started on steroids.  Family reports that prior to admission she had been utilizing 10 mg of oxycodone basically every 4 hours around-the-clock.  They have been researching options for short and long acting opioids and I discussed utilization of short and long-acting opioids in detail.  We also discussed use of adjunct medications such as steroids.  She reports today she has had significant improvement in her pain overall since starting on steroids and MAR reveals she is only had 1 dose of 5 mg of oxycodone in the last 24 hours.  We discussed bone pain and that medication such as steroids are often more helpful than opioids and controlling this type of pain.  We also discussed role of palliative radiation and hope that significant improved with  initiation of steroids is a good prognostic factor that radiation would be helpful for her as well.  Additionally, we discussed opioid related constipation.  She currently denies is a problem and reports having regular bowel movements.  SUMMARY OF RECOMMENDATIONS   -Pain, cancer related: Predominantly bony type pain that has improved significantly since starting on steroids.  Often bone pain regimen including steroids is more beneficial than opioids and hopefully this is something that improves long-term with continued radiation therapy.  We did discuss utilization of opioids and cancer-related pain including differences and reasoning for short acting versus long-acting opioids.  As pain is currently well controlled, we discussed plan to continue with oxycodone 5 mg as needed. -We discussed radiation therapy and she reports she tolerated well today without significant pain.  Discussed that she may need to pretreat with pain medication prior to going down if becomes uncomfortable to go for radiation. -Constipation, opioid related: Currently not an issue.  Continue to monitor closely and will increase bowel regimen as appropriate. -I did provide my card and family will call if pain becomes more of an issue.  Otherwise, I plan to check in on Friday if she remains admitted to see if  any other concerns regarding pain management with continued radiation therapy.    Palliative Prophylaxis:  Bowel Regimen and Frequent Pain Assessment  Additional Recommendations (Limitations, Scope, Preferences): Full Scope Treatment  Psycho-social/Spiritual:  Desire for further Chaplaincy support: Not addressed today  Prognosis:  Unable to determine  Discharge Planning: To Be Determined      Primary Diagnoses: Present on Admission:  Endometrial cancer ([place])  Metastatic cancer to spine ([place])  Lumbar cord compression (HCC)  Deep vein blood clot of right lower extremity (HCC)  Essential hypertension  Pressure  injury of buttock, stage 2 (HCC)  Acquired hypothyroidism  Anemia, chronic disease  Aortic atherosclerosis (HCC)  Dementia without behavioral disturbance (HCC)  Hypokalemia  Hypomagnesemia  Cerebrovascular disease   I have reviewed the medical record, interviewed the patient and family, and examined the patient. The following aspects are pertinent.  Past Medical History:  Diagnosis Date   Eczema    Endometrial cancer ([place]) 12/2007   s/p total abdominal hysterectomy at [facility] - ovaries were also removed    Family history of breast cancer    History of breast cancer    right breast -- treated with lumpectomy and radiation therarpy, postoperatively with tamoxifen    History of radiation therapy 10/22/2016 to 10/28/2016   Hypercholesterolemia    Hyperlipidemia    Hypertension    Palpitations    Personal history of radiation therapy 2006   PVC's (premature ventricular contractions)    Radiation 07/11/14-08/12/14   left lumbar paraspinal area 55 gray   S/P radiation therapy 10/28/11-12/05/11   5040 cGy left pelvis   S/P radiation therapy    Intracavitary brachytherapy of uterus   Urticaria    Social History   Socioeconomic History   Marital status: Married    Spouse name: [name]   Number of children: 2   Years of education: Not on file   Highest education level: Master's degree (e.g., MA, MS, MEng, MEd, MSW, MBA)  Occupational History   Occupation: retired  Tobacco Use   Smoking status: Former    Types: Cigarettes    Quit date: 05/13/1962    Years since quitting: 58.8   Smokeless tobacco: Never  Vaping Use   Vaping Use: Never used  Substance and Sexual Activity   Alcohol use: Yes    Comment: occasionally   Drug use: No   Sexual activity: Not Currently    Birth control/protection: Post-menopausal  Other Topics Concern   Not on file  Social History Narrative   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   Social Determinants of Health   Financial Resource Strain: Not on file  Food  Insecurity: Not on file  Transportation Needs: Not on file  Physical Activity: Not on file  Stress: Not on file  Social Connections: Not on file   Family History  Problem Relation Age of Onset   Thrombosis Father        coronary thrombosis   Hypertension Father    Heart failure Mother    Asthma Mother    Coronary artery disease Brother    Breast cancer Maternal Aunt        dx in her 5s   Stroke Maternal Uncle    Stroke Maternal Grandfather    Allergic rhinitis Neg Hx    Eczema Neg Hx    Urticaria Neg Hx    Scheduled Meds:  atenolol  25 mg Oral q morning   chlorhexidine  15 mL Mouth/Throat BID   Chlorhexidine Gluconate Cloth  6 each Topical  Daily   cholecalciferol  1,000 Units Oral q morning   dexamethasone (DECADRON) injection  4 mg Intravenous Q12H   donepezil  5 mg Oral QHS   feeding supplement  237 mL Oral TID BM   hydrALAZINE  50 mg Oral BID   levothyroxine  50 mcg Oral Q0600   loperamide  4 mg Oral TID   loratadine  10 mg Oral Daily   memantine  10 mg Oral BID   multivitamin with minerals  1 tablet Oral q morning   rivaroxaban  10 mg Oral Daily   simvastatin  20 mg Oral QPM   sodium chloride flush  10-40 mL Intracatheter Q12H   vitamin B-12  500 mcg Oral q morning   Continuous Infusions: PRN Meds:.diphenoxylate-atropine, lidocaine-prilocaine, liver oil-zinc oxide, ondansetron **OR** ondansetron (ZOFRAN) IV, oxyCODONE, sodium chloride flush Medications Prior to Admission:  Prior to Admission medications   Medication Sig Start Date End Date Taking? Authorizing Provider  atenolol (TENORMIN) 25 MG tablet Take 25 mg by mouth every morning. 01/08/11  Yes Darlin Coco, MD  BIOTIN PO Take 500 mcg by mouth every morning.   Yes [provider]  cetirizine (ZYRTEC) 10 MG tablet Take 10 mg by mouth every morning.   Yes [provider]  cholecalciferol (VITAMIN D3) 25 MCG (1000 UNIT) tablet Take 1,000 Units by mouth every morning.   Yes [provider]  Cyanocobalamin (VITAMIN B 12) 500 MCG TABS Take 500 mcg by mouth every morning.   Yes [provider]  donepezil (ARICEPT) 5 MG tablet TAKE 1 TABLET BY MOUTH EVERYDAY AT BEDTIME Patient taking differently: Take 5 mg by mouth at bedtime. 12/04/20  Yes Suzzanne Cloud, NP  furosemide (LASIX) 20 MG tablet Take 1 tablet (20 mg total) by mouth daily. 01/02/20  Yes Virgel Manifold, MD  hydrALAZINE (APRESOLINE) 25 MG tablet Take 25 mg by mouth See admin instructions. Take one tablet (25 mg) by mouth at 3pm for DBP >80 10/16/20  Yes [provider]  hydrALAZINE (APRESOLINE) 50 MG tablet Take 50 mg by mouth 2 (two) times daily. 10/18/20  Yes [provider]  ibuprofen (ADVIL) 800 MG tablet Take 800 mg by mouth every 8 (eight) hours as needed for headache (pain).   Yes [provider]  lenvatinib 10 mg daily dose (LENVIMA) capsule Take 10 mg by mouth every morning.   Yes [provider]  levothyroxine (SYNTHROID) 50 MCG tablet TAKE 1 TABLET BY MOUTH DAILY BEFORE BREAKFAST Patient taking differently: Take 50 mcg by mouth daily before breakfast. 01/25/21  Yes Gorsuch, Ni, MD  lidocaine-prilocaine (EMLA) cream Apply 1 application topically daily as needed (prior to port access). 01/22/21  Yes [provider]  memantine (NAMENDA) 5 MG tablet TAKE 2 TABLETS BY MOUTH TWICE A DAY Patient taking differently: Take 10 mg by mouth 2 (two) times daily. 10/19/20  Yes Suzzanne Cloud, NP  mometasone (ELOCON) 0.1 % ointment Apply 1 application topically daily as needed (rash/itching). 01/20/21  Yes [provider]  Multiple Vitamin (MULTIVITAMIN WITH MINERALS) TABS tablet Take 1 tablet by mouth every morning.   Yes [provider]  ondansetron (ZOFRAN) 8 MG tablet Take 8 mg by mouth 2 (two) times daily as needed for nausea or vomiting. 01/20/21  Yes [provider]  oxyCODONE (OXY IR/ROXICODONE) 5 MG immediate release tablet Take 1 tablet (5  mg total) by mouth every 6 (six) hours as needed for severe pain. 01/23/21  Yes Heath Lark, MD  Potassium 99 MG TABS Take 99 mg by mouth every morning.   Yes [provider]  rivaroxaban (XARELTO) 10 MG TABS tablet Take 1 tablet (10 mg total) by mouth daily. 11/08/19  Yes Gorsuch, Ni, MD  simvastatin (ZOCOR) 20 MG tablet Take 20 mg by mouth every evening.    Yes [provider]   Allergies  Allergen Reactions   Ace Inhibitors Anaphylaxis    Shortness of breath    Dilaudid [Hydromorphone Hcl] Other (See Comments)    "total loss of her mind"   Codeine Nausea Only   Review of Systems  Musculoskeletal:  Positive for back pain.  Neurological:  Positive for weakness.  Psychiatric/Behavioral:  Positive for confusion.    Physical Exam General: Alert, awake, in no acute distress.   HEENT: No bruits, no goiter, no JVD Lungs: Good air movement, no audible wheezing Ext: No significant edema Skin: Warm and dry Neuro: Grossly intact, nonfocal.   Vital Signs: BP (!) 143/82 (BP Location: Left Arm)   Pulse 65   Temp 97.9 F (36.6 C)   Resp 16   Ht $R'4\' 11"'Nx$  (1.499 m)   Wt 47.5 kg   SpO2 98%   BMI 21.15 kg/m  Pain Scale: 0-10 POSS *See Group Information*: S-Acceptable,Sleep, easy to arouse Pain Score: 5    SpO2: SpO2: 98 % O2 Device:SpO2: 98 % O2 Flow Rate: .   IO: Intake/output summary:  Intake/Output Summary (Last 24 hours) at 02/14/2021 1839 Last data filed at 02/14/2021 1037 Gross per 24 hour  Intake 380 ml  Output 900 ml  Net -520 ml    LBM: Last BM Date: 02/14/21 Baseline Weight: Weight: 47.5 kg Most recent weight: Weight: 47.5 kg     Palliative Assessment/Data:   Flowsheet Rows    Flowsheet Row Most Recent Value  Intake Tab   Referral Department Hospitalist  Unit at Time of Referral Med/Surg Unit  Palliative Care Primary Diagnosis Pain  Date Notified 02/13/21  Palliative Care Type New Palliative care  Reason for referral Pain  Date of Admission  02/12/21  Date first seen by Palliative Care 02/14/21  # of days Palliative referral response time 1 Day(s)  # of days IP prior to Palliative referral 1  Clinical Assessment   Palliative Performance Scale Score 60%  Psychosocial & Spiritual Assessment   Palliative Care Outcomes   Patient/Family meeting held? No       Time In: 1630 Time Out: 1715 Time Total: 45 minutes Greater than 50%  of this time was spent counseling and coordinating care related to the above assessment and plan.  Signed by: Micheline Rough, MD   Please contact Palliative Medicine Team phone at [phone] for questions and concerns.  For individual provider: See Shea Evans

## 2021-02-14 NOTE — Assessment & Plan Note (Signed)
Continue levothyroxine 

## 2021-02-14 NOTE — Progress Notes (Signed)
MD Danford notified for foley orders .

## 2021-02-14 NOTE — Assessment & Plan Note (Signed)
See above

## 2021-02-14 NOTE — Progress Notes (Signed)
[name]   DOB:1940/12/06   BS#:[id number]    ASSESSMENT & PLAN:  Endometrial cancer [facility]) PET CT imaging result is pending but based on my assessment and review, she has clear signs of disease progression MRI confirm risk of spinal cord injury The patient had falls recently She has numbness on the left lower extremity and incontinent with uncontrolled pain and recent weight loss I spent a lot of time convincing the patient for urgent admission and she finally agreed Since admission, she felt better with IV dexamethasone Her pain is still doing well with IV dexamethasone Radiation oncologist has been consulted, formal consult note is pending Will consult palliative care as well The patient is frail and has progressed on multiple lines of treatment She is not a candidate for further systemic treatment We will proceed with palliative radiation therapy while hospitalized if indicated Apparently, her family was not involved in the discussion with radiation oncologist Recommend her daughter to meet with the radiation oncologist team for risk and benefits of treatment In the meantime, I do not recommend dexamethasone taper; she can stay on 4 mg of dexamethasone whether IV or oral upon discharge   Metastatic cancer to bone [facility]) She is at risk of imminent cord compression I recommend urgent admission, MRI spine as well as IV Decadron Her symptoms has improved   Recurrent falls while walking She has recurrent falls We will put safety precaution while she is hospitalized We will consult physical therapy while she is here   Cancer associated pain She has poorly controlled pain, improved with IV dexamethasone I recommend IV pain medicine while hospitalized and palliative care consult for assistance in pain management   Diarrhea Her diarrhea is worse today I placed order for schedule Imodium 3 times a day along with Lomotil as needed This could be residual side effects from recent  immunotherapy  Stage II decubitus ulcer We will continue monitoring and wound dressing changes   Goals of care, counseling/discussion The goals of care and admission is fully discussed with the patient and family    Discharge planning She would likely be here until the end of the week I spent almost an hour with the patient and her daughter today All questions were answered. The patient knows to call the clinic with any problems, questions or concerns.   The total time spent in the appointment was 55 minutes encounter with patients including review of chart and various tests results, discussions about plan of care and coordination of care plan  Heath Lark, MD 02/14/2021 10:30 AM  Subjective:  She did not feel as good today as yesterday She has profuse diarrhea this morning Even though her appetite was good yesterday, she was only able to eat a little bit Her pain is well controlled  Objective:  Vitals:   02/13/21 2119 02/14/21 0632  BP: (!) 161/92 (!) 143/82  Pulse: 74 65  Resp: 16 16  Temp: 97.8 F (36.6 C) 97.9 F (36.6 C)  SpO2: 98% 98%     Intake/Output Summary (Last 24 hours) at 02/14/2021 1030 Last data filed at 02/14/2021 1026 Gross per 24 hour  Intake 370 ml  Output 1400 ml  Net -1030 ml    GENERAL:alert, no distress and comfortable NEURO: alert & oriented x 3 with fluent speech, no focal motor/sensory deficits   Labs:  Recent Labs    01/23/21 0957 02/12/21 1616 02/12/21 2149 02/13/21 0433  NA 139 139 138 139  K 3.7 3.7 3.4* 3.8  CL 99 102 101 104  CO2 26 21* 24 25  GLUCOSE 90 110* 94 131*  BUN 17 20 20 20   CREATININE 1.06* 0.91 0.91 0.98  CALCIUM 9.7 9.2 9.2 8.6*  GFRNONAA 53* >60 >60 58*  PROT 7.1 6.8 7.0  --   ALBUMIN 3.5 3.4* 3.6  --   AST 51* 41* 39  --   ALT 30 31 28   --   ALKPHOS 53 54 43  --   BILITOT 1.2 0.8 1.1  --     Studies:  MR Cervical Spine W or Wo Contrast  Result Date: 02/12/2021 CLINICAL DATA:  Initial evaluation for  endometrial carcinoma, metastatic evaluation. Frequent falls. EXAM: MRI CERVICAL SPINE WITHOUT AND WITH CONTRAST TECHNIQUE: Multiplanar and multiecho pulse sequences of the cervical spine, to include the craniocervical junction and cervicothoracic junction, were obtained without and with intravenous contrast. CONTRAST:  53mL GADAVIST GADOBUTROL 1 MMOL/ML IV SOLN COMPARISON:  Comparison made with PET-CT performed earlier the same day. FINDINGS: Alignment: Straightening of the normal cervical lordosis. Grade 1 anterolisthesis of C2 on C3 and C7 on T1, chronic and degenerative. Vertebrae: Vertebral body height maintained without acute or chronic fracture. Bone marrow signal intensity within normal limits. No discrete or worrisome osseous lesions. No evidence for osseous metastatic disease within the cervical spine. No evidence for osteomyelitis discitis or septic arthritis. Mild reactive marrow edema about the left C2-3 facet due to facet arthritis. Cord: Normal signal and morphology. No epidural or intradural tumor. No abnormal enhancement. Posterior Fossa, vertebral arteries, paraspinal tissues: T2/STIR signal abnormality within the visualized pons, likely related to chronic microvascular ischemic disease. Visualized brain and posterior fossa otherwise unremarkable. Craniocervical junction normal. Paraspinous and prevertebral soft tissues within normal limits. Normal flow voids seen within the vertebral arteries bilaterally. 5 mm right thyroid nodule noted, of doubtful significance given size and patient age, no follow-up imaging recommended (ref: J Am Coll Radiol. 2015 Feb;12(2): 143-50). Disc levels: C2-C3: Anterolisthesis. Mild disc bulge with prominent left-sided facet degeneration. No spinal stenosis. Foramina remain patent. C3-C4: Degenerative intervertebral disc space narrowing with diffuse disc osteophyte complex. Flattening of the ventral thecal sac with resultant mild spinal stenosis. Moderate right C4  foraminal narrowing. Left neural foramina remains patent. C4-C5: Degenerative intervertebral disc space narrowing with diffuse disc osteophyte complex. Broad posterior component flattens and partially faces the ventral thecal sac. Superimposed facet and ligament flavum hypertrophy. Resultant moderate spinal stenosis with minimal cord flattening, but no cord signal changes. Moderate right C5 foraminal narrowing. Left neural foramina remains patent. C5-C6: Degenerative intervertebral disc space narrowing with diffuse disc osteophyte complex. Broad posterior component flattens and effaces the ventral thecal sac, asymmetric to the right. Resultant moderate spinal stenosis with mild cord flattening, worse on the right. No cord signal changes. Severe right with moderate left C6 foraminal narrowing. C6-C7: Degenerative intervertebral disc space narrowing with diffuse disc osteophyte complex. Flattening and partial effacement of the ventral thecal sac with resultant mild spinal stenosis. Moderate bilateral C7 foraminal narrowing. C7-T1: Anterolisthesis with mild disc bulge. Moderate bilateral facet arthrosis. No significant spinal stenosis. Foramina remain patent. IMPRESSION: 1. No evidence for metastatic disease within the cervical spine. 2. Moderate multilevel cervical spondylosis with resultant mild to moderate diffuse spinal stenosis at C3-4 through C6-7. 3. Multifactorial degenerative changes with resultant multilevel foraminal narrowing as above. Notable findings include moderate right C4 and C5 foraminal narrowing, severe right with moderate left C6 foraminal stenosis, with moderate bilateral C7 foraminal narrowing. Electronically Signed   By: Marland Kitchen  Jeannine Boga M.D.   On: 02/12/2021 20:43   MR THORACIC SPINE W WO CONTRAST  Result Date: 02/12/2021 CLINICAL DATA:  Initial evaluation for endometrial carcinoma, metastatic evaluation. Frequent falls. EXAM: MRI THORACIC WITHOUT AND WITH CONTRAST TECHNIQUE:  Multiplanar and multiecho pulse sequences of the thoracic spine were obtained without and with intravenous contrast. CONTRAST:  5mL GADAVIST GADOBUTROL 1 MMOL/ML IV SOLN COMPARISON:  PET-CT from earlier the same day. FINDINGS: Alignment: Thoracolumbar scoliosis partially visualized. Trace degenerative anterolisthesis of T2 on T3. Vertebrae: Vertebral body height maintained without acute or chronic fracture. Bone marrow signal intensity diffusely heterogeneous. Few scattered benign hemangiomata noted, most prominent of which measures 1.5 cm within the T8 vertebral body. No worrisome osseous lesions. No evidence for osseous metastatic disease. No evidence for osteomyelitis discitis or septic arthritis. Cord: Normal signal and morphology. No epidural or intradural tumor. No abnormal enhancement. Paraspinal and other soft tissues: Paraspinous soft tissues demonstrate no acute or significant finding. Partially visualized lungs are grossly clear. 5 mm right thyroid nodule partially visualized, of doubtful significance given size and patient age, no follow-up imaging recommended (ref: J Am Coll Radiol. 2015 Feb;12(2): 143-50).Remainder the visualized visceral structures demonstrate no other significant finding. Disc levels: T9-10: Right paracentral disc protrusion indents the right ventral thecal sac (series 26, image 12). Mild flattening of the right ventral cord without cord signal changes or significant spinal stenosis. Otherwise, ordinary for age degenerative disc disease and facet hypertrophy seen elsewhere throughout the thoracic spine. No other significant spinal stenosis. Foramina remain patent. IMPRESSION: 1. No evidence for metastatic disease within the thoracic spine. 2. Right paracentral disc protrusion at T9-10 with mild flattening of the right hemi cord. No cord signal changes or significant stenosis. Electronically Signed   By: Jeannine Boga M.D.   On: 02/12/2021 21:20   MR Lumbar Spine W Wo  Contrast  Result Date: 02/12/2021 CLINICAL DATA:  Initial evaluation for history of endometrial carcinoma, metastatic evaluation, frequent falls. EXAM: MRI LUMBAR SPINE WITHOUT AND WITH CONTRAST TECHNIQUE: Multiplanar and multiecho pulse sequences of the lumbar spine were obtained without and with intravenous contrast. CONTRAST:  55mL GADAVIST GADOBUTROL 1 MMOL/ML IV SOLN COMPARISON:  Prior PET-CT from earlier the same day as well as previous MRI from 11/25/2020. FINDINGS: Segmentation: Standard. Lowest well-formed disc space labeled the L5-S1 level. Alignment: Advanced sigmoid scoliotic curvature of the thoracolumbar spine. 6 mm anterolisthesis of L4 on L5, with trace retrolisthesis of T12 on L1 and L3 on L4. Appearance is stable. Vertebrae: Again seen is abnormal T1 hypointense enhancing lesion involving the left aspect of the L3 and L4 vertebral bodies, concerning for malignancy. Extension to involve the left pedicles and posterior elements of L3 and L4, similar to perhaps mildly progressed. Additionally, abnormal T1 hypointensity now seen extending to involve the central and right aspect of the L3 and L4 vertebral bodies, increased from prior, and suggesting disease progression. Extraosseous extension with tumor seen obliterating the left L3-4 neural foramen, with extension to partially involve the left L4-5 neural foramen as well (series 1, images 11, 10). Probable early extension superiorly towards the left L2-3 neural foramen as well (series 1, image 13). Evidence for epidural involvement along the left and ventral aspect of the thecal sac extending from L3-4 through L4-5 (series 7, images 25-30). This appears slightly more prominent as compared to previous, with possible intradural extension at the level of L4-5 (series 7, image 29). Additionally, soft tissue density within the left ventral epidural space posterior to L5 is new  from prior, and concerning for progressive epidural tumor (series 7, image 31).  Soft tissue mass involving the left paraspinous soft tissues at the level of L3-4 again seen, slightly increased in size measuring 2.7 x 1.7 cm (series 7, image 25, previously 2.3 x 1.5 cm when measured in a similar fashion on prior exam). Associated edema and enhancement within the adjacent left psoas muscle without frank soft tissue lesion. Vertebral body height maintained without interval pathologic fracture. Reactive edema about the T12-L1 interspace favored to be degenerative in nature, similar to prior. No other distant sites of osseous metastatic disease identified. Probable postradiation changes noted elsewhere within the lumbar spine. Conus medullaris and cauda equina: Conus extends to the L1-2 level. Conus itself appears normal. Question intradural extension of tumor with involvement of the left-sided nerve roots of the cauda equina at L4-5 (series 7, image 28). Paraspinal and other soft tissues: Slightly increased size of left-sided paraspinous soft tissue mass at L3-4 as above. Associated edema and enhancement within the adjacent left psoas muscle Tiny subcentimeter simple cyst noted within the right kidney. Probable cholelithiasis noted within the partially visualized gallbladder. Disc levels: T12-L1: Degenerative intervertebral disc space narrowing with disc bulge and reactive endplate spurring, greater on the right. Bilateral facet hypertrophy. No spinal stenosis. Mild right foraminal narrowing. Left neural foramina remains patent. L1-2: Degenerative intervertebral disc space narrowing with disc desiccation and diffuse disc bulge, asymmetric to the right. Associated right-sided reactive endplate change. Moderate right worse than left facet hypertrophy. Resultant moderate narrowing of the right lateral recess with mild-to-moderate right foraminal narrowing. Central canal and left neural foramina remain patent. L2-3: Degenerative intervertebral disc space narrowing with diffuse disc bulge and disc  desiccation. Associated reactive endplate change, greater on the right. Moderate bilateral facet arthrosis. No significant spinal stenosis. Mild right L2 foraminal narrowing. L3-4: Left eccentric disc bulge with reactive endplate change. Moderate left worse than right facet arthrosis. Superimposed tumor obliterates the left neural foramen as above. Resultant severe left lateral recess stenosis and left foraminal narrowing. Mild-to-moderate right foraminal narrowing. L4-5: Anterolisthesis with broad posterior pseudo disc bulge and severe facet arthrosis. New soft tissue density within the left ventral epidural space concerning for possible tumor (series 4, image 31). Resultant moderate canal and right lateral recess stenosis, with progressive severe narrowing of the left lateral recess. Mild right with severe left L4 foraminal stenosis. L5-S1: Disc desiccation with minimal annular bulge. Severe right with moderate left facet arthrosis. No significant spinal stenosis. Foramina remain patent. IMPRESSION: 1. Persistent T1 hypointense enhancing lesion involving the left aspects of the L3 and L4 vertebral bodies, with progressive involvement of the central and right aspects of both vertebral bodies since prior, consistent with progressive disease. No pathologic fracture. 2. Interval increase in size of left paraspinal soft tissue mass at the level of L3-4, with involvement of the adjacent left L3-4 and L4-5 neural foramina. Epidural involvement involving the left epidural space at L3-4 and L4-5 appears slightly worsened, with new involvement of the left ventral epidural space posterior to L5. Question intradural extension with involvement of the nerve roots of the cauda equina at L4-5 as above. 3. Underlying sigmoid scoliosis with advanced multilevel degenerative spondylosis and facet arthrosis as above, otherwise stable from prior. 4. Cholelithiasis. Electronically Signed   By: Jeannine Boga M.D.   On: 02/12/2021  22:03   NM PET Image Restage (PS) Skull Base to Thigh (F-18 FDG)  Result Date: 02/13/2021 CLINICAL DATA:  Subsequent treatment strategy for recurrent endometrial cancer  and non-small cell right lung carcinoma. EXAM: NUCLEAR MEDICINE PET SKULL BASE TO THIGH TECHNIQUE: 5.4 mCi F-18 FDG was injected intravenously. Full-ring PET imaging was performed from the skull base to thigh after the radiotracer. CT data was obtained and used for attenuation correction and anatomic localization. Fasting blood glucose: 105 mg/dl COMPARISON:  11/15/2020 PET-CT. FINDINGS: Mediastinal blood pool activity: SUV max 2.6 Liver activity: SUV max NA NECK: No hypermetabolic lymph nodes in the neck. Relatively symmetric muscular activity throughout the bilateral neck, most compatible with activity related uptake. Incidental CT findings: Left internal jugular Port-A-Cath terminates in the middle third of the SVC. CHEST: No enlarged or hypermetabolic axillary, mediastinal or hilar lymph nodes. No hypermetabolic pulmonary findings. Incidental CT findings: Coronary atherosclerosis. Atherosclerotic nonaneurysmal thoracic aorta. Surgical clips throughout the right axilla. Stable postsurgical changes from wedge resection in the parahilar upper right lung. No new significant pulmonary nodules. ABDOMEN/PELVIS: No abnormal hypermetabolic activity within the liver, pancreas, adrenal glands, or spleen. No hypermetabolic lymph nodes in the abdomen or pelvis. Incidental CT findings: Cholelithiasis. Atherosclerotic nonaneurysmal abdominal aorta. Moderate sigmoid diverticulosis. Hysterectomy. SKELETON: L3 vertebral hypermetabolism with max SUV 10.4, relatively occult on the low-dose CT images, previous max SUV 14.1, mildly decreased in metabolism however clearly increased in extent since 11/15/2020 PET-CT. Left and superior L4 vertebral hypermetabolism with max SUV 6.8, previous max SUV 5.8, significantly increased in extent and mildly increased in  metabolism since 11/15/2020 PET-CT. No additional sites of hypermetabolic skeletal disease. Incidental CT findings: Long segment moderate levocurvature of the thoracolumbar spine. IMPRESSION: 1. Hypermetabolic L3 and L4 vertebral metastases are clearly increased in extent since 11/15/2020 PET-CT, with mild variable changes in max SUV levels as detailed. Favor progression of skeletal metastatic disease, as also described on concurrent MRI lumbar spine report. 2. No additional skeletal or extra-skeletal sites of hypermetabolic metastatic disease. 3. Chronic findings include: Aortic Atherosclerosis (ICD10-I70.0). Coronary atherosclerosis. Cholelithiasis. Moderate sigmoid diverticulosis. Electronically Signed   By: Ilona Sorrel M.D.   On: 02/13/2021 14:24

## 2021-02-14 NOTE — Progress Notes (Signed)
Initial Nutrition Assessment  DOCUMENTATION CODES:   Not applicable  INTERVENTION:   -Ensure Enlive po TID, each supplement provides 350 kcal and 20 grams of protein  -MVI with minerals daily -Magic cup TID with meals, each supplement provides 290 kcal and 9 grams of protein   NUTRITION DIAGNOSIS:   Increased nutrient needs related to cancer and cancer related treatments as evidenced by estimated needs.  GOAL:   Patient will meet greater than or equal to 90% of their needs  MONITOR:   PO intake, Supplement acceptance, Labs, Weight trends, Skin, I & O's  REASON FOR ASSESSMENT:   Malnutrition Screening Tool    ASSESSMENT:   [name] is an 80 y.o. female with a history of endometrial cancer with lumbar metastasis with recent scans demonstrating suspected cord compression and was referred to the ED by her oncologist due to progressive lower extremity symptoms. Plan is to initiate radiation therapy as inpatient.  Pt admitted with lumbar cord compression.   Reviewed I/O's: -962 ml x 24 hours and -1.1 L since admission  UOP: 1.2 L x 24 hours  Pt unavailable at time of visit. Attempted to speak with pt via call to hospital room phone, however, unable to reach. RD unable to obtain further nutrition-related history or complete nutrition-focused physical exam at this time.    Per oncology notes, pt with worsening LLE weakness consistent with nerve compression from spinal mets. She had had multiple falls due to weakness. Plan to initiate palliative radiation therapy while inpatient.   Pt currently on a regular diet. No meal completion data available to assess at this time. Suspect suboptimal oral intake PTA secondary to weight loss.   Reviewed wt hx; pt has experienced a 10.9% wt loss over the past 2 months, which is significant for time frame.   Pt is at high risk for malnutrition, however, unable to identify at this time. Pt would greatly benefit from addition of oral  nutrition supplements.   Oncology recommending palliative care consult.   Medications reviewed and include vitamin D3, decadron, imodium, and vitamin B-12.   Labs reviewed.   Diet Order:   Diet Order             Diet regular Room service appropriate? Yes; Fluid consistency: Thin  Diet effective now                   EDUCATION NEEDS:   No education needs have been identified at this time  Skin:  Skin Assessment: Skin Integrity Issues: Skin Integrity Issues:: Other (Comment), Stage I, Stage II Stage I: lt buttocks Stage II: buttocks Other: MASD from pubic bone to back bone  Last BM:  Unknown  Height:   Ht Readings from Last 1 Encounters:  02/13/21 4\' 11"  (1.499 m)    Weight:   Wt Readings from Last 1 Encounters:  02/13/21 47.5 kg    Ideal Body Weight:  44.7 kg  BMI:  Body mass index is 21.15 kg/m.  Estimated Nutritional Needs:   Kcal:  1650-1850  Protein:  80-95 grams  Fluid:  > 1.6 L    Loistine Chance, RD, LDN, [place] Registered Dietitian II Certified Diabetes Care and Education Specialist Please refer to [facility] for RD and/or RD on-call/weekend/after hours pager

## 2021-02-14 NOTE — Assessment & Plan Note (Signed)
As evidenced by moderate loss of subcutaneous muscle mass and fat, and 10% weight loss in the last 2 months.

## 2021-02-14 NOTE — Assessment & Plan Note (Signed)
-   COnsult Oncology - Consult Palliative care

## 2021-02-14 NOTE — Assessment & Plan Note (Signed)
Continue Xarelto 

## 2021-02-14 NOTE — Assessment & Plan Note (Signed)
Blood pressure elevated - Continue atenolol, hydralazine

## 2021-02-14 NOTE — Progress Notes (Signed)
Unable to contact oncology MD to verify for foley necessity. Per previous note from [facility], foley was inserted d/t wounds on sacral and perinium. Daughter states " oncology will make that call, we don't want to take it out until she goes home".  Pt and daughter educated on foley and risk of infection and voiced understanding. Will continue to monitor. MD [place] notified and aware

## 2021-02-14 NOTE — Assessment & Plan Note (Signed)
Hgb stable relative to abseline

## 2021-02-14 NOTE — Assessment & Plan Note (Signed)
-   Consult wound care

## 2021-02-15 ENCOUNTER — Ambulatory Visit
Admit: 2021-02-15 | Discharge: 2021-02-15 | Disposition: A | Discharge status: Home / Self Care | Payer: Medicare Other | Attending: Radiation Oncology | Admitting: Radiation Oncology

## 2021-02-15 ENCOUNTER — Telehealth: Payer: Self-pay

## 2021-02-15 DIAGNOSIS — E039 Hypothyroidism, unspecified: Secondary | ICD-10-CM | POA: Diagnosis not present

## 2021-02-15 DIAGNOSIS — G9520 Unspecified cord compression: Secondary | ICD-10-CM | POA: Diagnosis not present

## 2021-02-15 DIAGNOSIS — D638 Anemia in other chronic diseases classified elsewhere: Secondary | ICD-10-CM | POA: Diagnosis not present

## 2021-02-15 DIAGNOSIS — I700 Atherosclerosis of aorta: Secondary | ICD-10-CM | POA: Diagnosis not present

## 2021-02-15 LAB — BASIC METABOLIC PANEL
Anion gap: 5 (ref 5–15)
BUN: 31 mg/dL — ABNORMAL HIGH (ref 8–23)
CO2: 27 mmol/L (ref 22–32)
Calcium: 9 mg/dL (ref 8.9–10.3)
Chloride: 107 mmol/L (ref 98–111)
Creatinine, Ser: 0.79 mg/dL (ref 0.44–1.00)
GFR, Estimated: >60 mL/min (ref >60)
Glucose, Bld: 162 mg/dL — ABNORMAL HIGH (ref 70–99)
Potassium: 4.1 mmol/L (ref 3.5–5.1)
Sodium: 139 mmol/L (ref 135–145)

## 2021-02-15 LAB — MAGNESIUM: Magnesium: 2 mg/dL (ref 1.7–2.4)

## 2021-02-15 MED ORDER — DEXAMETHASONE 4 MG PO TABS
4.0000 mg | ORAL_TABLET | Freq: Every day | ORAL | Status: DC
  Administered 2021-02-16: 4 mg via ORAL
  Filled 2021-02-15: qty 1

## 2021-02-15 MED ORDER — GERHARDT'S BUTT CREAM
TOPICAL_CREAM | Freq: Two times a day (BID) | CUTANEOUS | Status: DC
  Administered 2021-02-16: 1 via TOPICAL
  Filled 2021-02-15: qty 1

## 2021-02-15 NOTE — Assessment & Plan Note (Signed)
-   Continue memantine, donepezil

## 2021-02-15 NOTE — Assessment & Plan Note (Addendum)
Able to ambulate - Continue palliative radiation therapy  - Continue Dexamethasone, continue at current dose at discharge - PRN oxycodone - Maintain foley     Oncology have asked [place] to keep the patient until Friday, or through her entire radiation treatment (I believe ten treatments) if family cannot transport patient to radiation (as she is ambulatory, I assume they can, although I will defer this discussion to [facility] and PT)

## 2021-02-15 NOTE — Consult Note (Signed)
[facility] Nurse Consult Note: Reason for Consult: sacral pressure injury; family education  Wound type:MASD (moisture associated skin damage, buttocks/perineum  Stage 2 Pressure Injury: buttock Pressure Injury POA: Yes Measurement:  Pressure injury: 3cm x 2cm x 0.1cm  Wound bed: pink, clean Intense redness over the perineum and buttock  Drainage (amount, consistency, odor) none Periwound: intact  Dressing procedure/placement/frequency: Add Gerhhart's butt cream to affected areas Add chair pressure redistribution pad, when up in chair and to be sent home with patient for use at home.  Suggested air mattress for moisture and pressure management at home, however she has been in regular bed until this point.  Reports only acutely ill in last few weeks.   [place] to palliative about home needs.   Discussed POC with patient and bedside nurse.  Re consult if needed, will not follow at this time. Thanks  Dent Plantz [employer], RN,CWOCN, CNS, [place] [phone])

## 2021-02-15 NOTE — Assessment & Plan Note (Signed)
-   Continue simvastatin, atenolol

## 2021-02-15 NOTE — Assessment & Plan Note (Signed)
Hgb stable relative to abseline

## 2021-02-15 NOTE — Progress Notes (Addendum)
[name]   DOB:[birth year]   XN#:[id number]    Have seen the patient, examined her and agree with documentation as follows  ASSESSMENT & PLAN:  Endometrial cancer [facility]) PET CT imaging result is pending but based on my assessment and review, she has clear signs of disease progression MRI confirm risk of spinal cord injury The patient had falls recently She has numbness on the left lower extremity and incontinent with uncontrolled pain and recent weight loss I spent a lot of time convincing the patient for urgent admission and she finally agreed Since admission, she felt better with IV dexamethasone Her pain is still doing well with IV dexamethasone Radiation oncologist has been consulted, and they plan for 10 fractions of radiation Palliative care is also been consulted and assisting with pain management The patient is frail and has progressed on multiple lines of treatment She is not a candidate for further systemic treatment She is receiving palliative radiation per Dr. Sondra Come. In the meantime, I do not recommend dexamethasone taper; she can stay on 4 mg of dexamethasone whether IV or oral upon discharge   Metastatic cancer to bone [facility]) She is at risk of imminent cord compression I recommend urgent admission, MRI spine as well as IV Decadron Her symptoms has improved   Recurrent falls while walking She has recurrent falls We will put safety precaution while she is hospitalized PT has been consulted   Cancer associated pain She has poorly controlled pain, improved with IV dexamethasone I recommend IV pain medicine while hospitalized and palliative care consult for assistance in pain management   Diarrhea improving She has ongoing diarrhea but somewhat improved compared to yesterday She is on Imodium 3 times a day and also has Lomotil as needed This could be residual side effects from recent immunotherapy  Stage II decubitus ulcer We will continue monitoring and wound  dressing changes   Goals of care, counseling/discussion The goals of care and admission is fully discussed with the patient and family    Discharge planning She would likely be here until the end of the week and possibly through the end of radiation if family unable to transport. All questions were answered. The patient knows to call the clinic with any problems, questions or concerns. I will sign off I will arrange for outpatient/home base palliative care referral   Mikey Bussing, NP 02/15/2021 10:45 AM Heath Lark, MD  Subjective:  She reports improvement in her appetite She states that she has a few mouth sores but feels that these are improved Continues to have diarrhea Pain is well controlled at this time  Objective:  Vitals:   02/14/21 2228 02/15/21 0511  BP: (!) 149/83 137/70  Pulse: 80 73  Resp: 16 16  Temp: 98.2 F (36.8 C) 98.2 F (36.8 C)  SpO2: 97% 97%     Intake/Output Summary (Last 24 hours) at 02/15/2021 1045 Last data filed at 02/15/2021 0600 Gross per 24 hour  Intake 120 ml  Output 400 ml  Net -280 ml    GENERAL:alert, no distress and comfortable NEURO: alert & oriented x 3 with fluent speech, no focal motor/sensory deficits   Labs:  Recent Labs    01/23/21 0957 02/12/21 1616 02/12/21 2149 02/13/21 0433 02/15/21 0338  NA 139 139 138 139 139  K 3.7 3.7 3.4* 3.8 4.1  CL 99 102 101 104 107  CO2 26 21* 24 25 27   GLUCOSE 90 110* 94 131* 162*  BUN 17 20 20  20  31*  CREATININE 1.06* 0.91 0.91 0.98 0.79  CALCIUM 9.7 9.2 9.2 8.6* 9.0  GFRNONAA 53* >60 >60 58* >60  PROT 7.1 6.8 7.0  --   --   ALBUMIN 3.5 3.4* 3.6  --   --   AST 51* 41* 39  --   --   ALT 30 31 28   --   --   ALKPHOS 53 54 43  --   --   BILITOT 1.2 0.8 1.1  --   --     Studies:  MR Cervical Spine W or Wo Contrast  Result Date: 02/12/2021 CLINICAL DATA:  Initial evaluation for endometrial carcinoma, metastatic evaluation. Frequent falls. EXAM: MRI CERVICAL SPINE WITHOUT AND WITH  CONTRAST TECHNIQUE: Multiplanar and multiecho pulse sequences of the cervical spine, to include the craniocervical junction and cervicothoracic junction, were obtained without and with intravenous contrast. CONTRAST:  53mL GADAVIST GADOBUTROL 1 MMOL/ML IV SOLN COMPARISON:  Comparison made with PET-CT performed earlier the same day. FINDINGS: Alignment: Straightening of the normal cervical lordosis. Grade 1 anterolisthesis of C2 on C3 and C7 on T1, chronic and degenerative. Vertebrae: Vertebral body height maintained without acute or chronic fracture. Bone marrow signal intensity within normal limits. No discrete or worrisome osseous lesions. No evidence for osseous metastatic disease within the cervical spine. No evidence for osteomyelitis discitis or septic arthritis. Mild reactive marrow edema about the left C2-3 facet due to facet arthritis. Cord: Normal signal and morphology. No epidural or intradural tumor. No abnormal enhancement. Posterior Fossa, vertebral arteries, paraspinal tissues: T2/STIR signal abnormality within the visualized pons, likely related to chronic microvascular ischemic disease. Visualized brain and posterior fossa otherwise unremarkable. Craniocervical junction normal. Paraspinous and prevertebral soft tissues within normal limits. Normal flow voids seen within the vertebral arteries bilaterally. 5 mm right thyroid nodule noted, of doubtful significance given size and patient age, no follow-up imaging recommended (ref: J Am Coll Radiol. 2015 Feb;12(2): 143-50). Disc levels: C2-C3: Anterolisthesis. Mild disc bulge with prominent left-sided facet degeneration. No spinal stenosis. Foramina remain patent. C3-C4: Degenerative intervertebral disc space narrowing with diffuse disc osteophyte complex. Flattening of the ventral thecal sac with resultant mild spinal stenosis. Moderate right C4 foraminal narrowing. Left neural foramina remains patent. C4-C5: Degenerative intervertebral disc space  narrowing with diffuse disc osteophyte complex. Broad posterior component flattens and partially faces the ventral thecal sac. Superimposed facet and ligament flavum hypertrophy. Resultant moderate spinal stenosis with minimal cord flattening, but no cord signal changes. Moderate right C5 foraminal narrowing. Left neural foramina remains patent. C5-C6: Degenerative intervertebral disc space narrowing with diffuse disc osteophyte complex. Broad posterior component flattens and effaces the ventral thecal sac, asymmetric to the right. Resultant moderate spinal stenosis with mild cord flattening, worse on the right. No cord signal changes. Severe right with moderate left C6 foraminal narrowing. C6-C7: Degenerative intervertebral disc space narrowing with diffuse disc osteophyte complex. Flattening and partial effacement of the ventral thecal sac with resultant mild spinal stenosis. Moderate bilateral C7 foraminal narrowing. C7-T1: Anterolisthesis with mild disc bulge. Moderate bilateral facet arthrosis. No significant spinal stenosis. Foramina remain patent. IMPRESSION: 1. No evidence for metastatic disease within the cervical spine. 2. Moderate multilevel cervical spondylosis with resultant mild to moderate diffuse spinal stenosis at C3-4 through C6-7. 3. Multifactorial degenerative changes with resultant multilevel foraminal narrowing as above. Notable findings include moderate right C4 and C5 foraminal narrowing, severe right with moderate left C6 foraminal stenosis, with moderate bilateral C7 foraminal narrowing. Electronically Signed   By: Marland Kitchen  Jeannine Boga M.D.   On: 02/12/2021 20:43   MR THORACIC SPINE W WO CONTRAST  Result Date: 02/12/2021 CLINICAL DATA:  Initial evaluation for endometrial carcinoma, metastatic evaluation. Frequent falls. EXAM: MRI THORACIC WITHOUT AND WITH CONTRAST TECHNIQUE: Multiplanar and multiecho pulse sequences of the thoracic spine were obtained without and with intravenous  contrast. CONTRAST:  65mL GADAVIST GADOBUTROL 1 MMOL/ML IV SOLN COMPARISON:  PET-CT from earlier the same day. FINDINGS: Alignment: Thoracolumbar scoliosis partially visualized. Trace degenerative anterolisthesis of T2 on T3. Vertebrae: Vertebral body height maintained without acute or chronic fracture. Bone marrow signal intensity diffusely heterogeneous. Few scattered benign hemangiomata noted, most prominent of which measures 1.5 cm within the T8 vertebral body. No worrisome osseous lesions. No evidence for osseous metastatic disease. No evidence for osteomyelitis discitis or septic arthritis. Cord: Normal signal and morphology. No epidural or intradural tumor. No abnormal enhancement. Paraspinal and other soft tissues: Paraspinous soft tissues demonstrate no acute or significant finding. Partially visualized lungs are grossly clear. 5 mm right thyroid nodule partially visualized, of doubtful significance given size and patient age, no follow-up imaging recommended (ref: J Am Coll Radiol. 2015 Feb;12(2): 143-50).Remainder the visualized visceral structures demonstrate no other significant finding. Disc levels: T9-10: Right paracentral disc protrusion indents the right ventral thecal sac (series 26, image 12). Mild flattening of the right ventral cord without cord signal changes or significant spinal stenosis. Otherwise, ordinary for age degenerative disc disease and facet hypertrophy seen elsewhere throughout the thoracic spine. No other significant spinal stenosis. Foramina remain patent. IMPRESSION: 1. No evidence for metastatic disease within the thoracic spine. 2. Right paracentral disc protrusion at T9-10 with mild flattening of the right hemi cord. No cord signal changes or significant stenosis. Electronically Signed   By: Jeannine Boga M.D.   On: 02/12/2021 21:20   MR Lumbar Spine W Wo Contrast  Result Date: 02/12/2021 CLINICAL DATA:  Initial evaluation for history of endometrial carcinoma,  metastatic evaluation, frequent falls. EXAM: MRI LUMBAR SPINE WITHOUT AND WITH CONTRAST TECHNIQUE: Multiplanar and multiecho pulse sequences of the lumbar spine were obtained without and with intravenous contrast. CONTRAST:  20mL GADAVIST GADOBUTROL 1 MMOL/ML IV SOLN COMPARISON:  Prior PET-CT from earlier the same day as well as previous MRI from 11/25/2020. FINDINGS: Segmentation: Standard. Lowest well-formed disc space labeled the L5-S1 level. Alignment: Advanced sigmoid scoliotic curvature of the thoracolumbar spine. 6 mm anterolisthesis of L4 on L5, with trace retrolisthesis of T12 on L1 and L3 on L4. Appearance is stable. Vertebrae: Again seen is abnormal T1 hypointense enhancing lesion involving the left aspect of the L3 and L4 vertebral bodies, concerning for malignancy. Extension to involve the left pedicles and posterior elements of L3 and L4, similar to perhaps mildly progressed. Additionally, abnormal T1 hypointensity now seen extending to involve the central and right aspect of the L3 and L4 vertebral bodies, increased from prior, and suggesting disease progression. Extraosseous extension with tumor seen obliterating the left L3-4 neural foramen, with extension to partially involve the left L4-5 neural foramen as well (series 1, images 11, 10). Probable early extension superiorly towards the left L2-3 neural foramen as well (series 1, image 13). Evidence for epidural involvement along the left and ventral aspect of the thecal sac extending from L3-4 through L4-5 (series 7, images 25-30). This appears slightly more prominent as compared to previous, with possible intradural extension at the level of L4-5 (series 7, image 29). Additionally, soft tissue density within the left ventral epidural space posterior to L5 is new  from prior, and concerning for progressive epidural tumor (series 7, image 31). Soft tissue mass involving the left paraspinous soft tissues at the level of L3-4 again seen, slightly  increased in size measuring 2.7 x 1.7 cm (series 7, image 25, previously 2.3 x 1.5 cm when measured in a similar fashion on prior exam). Associated edema and enhancement within the adjacent left psoas muscle without frank soft tissue lesion. Vertebral body height maintained without interval pathologic fracture. Reactive edema about the T12-L1 interspace favored to be degenerative in nature, similar to prior. No other distant sites of osseous metastatic disease identified. Probable postradiation changes noted elsewhere within the lumbar spine. Conus medullaris and cauda equina: Conus extends to the L1-2 level. Conus itself appears normal. Question intradural extension of tumor with involvement of the left-sided nerve roots of the cauda equina at L4-5 (series 7, image 28). Paraspinal and other soft tissues: Slightly increased size of left-sided paraspinous soft tissue mass at L3-4 as above. Associated edema and enhancement within the adjacent left psoas muscle Tiny subcentimeter simple cyst noted within the right kidney. Probable cholelithiasis noted within the partially visualized gallbladder. Disc levels: T12-L1: Degenerative intervertebral disc space narrowing with disc bulge and reactive endplate spurring, greater on the right. Bilateral facet hypertrophy. No spinal stenosis. Mild right foraminal narrowing. Left neural foramina remains patent. L1-2: Degenerative intervertebral disc space narrowing with disc desiccation and diffuse disc bulge, asymmetric to the right. Associated right-sided reactive endplate change. Moderate right worse than left facet hypertrophy. Resultant moderate narrowing of the right lateral recess with mild-to-moderate right foraminal narrowing. Central canal and left neural foramina remain patent. L2-3: Degenerative intervertebral disc space narrowing with diffuse disc bulge and disc desiccation. Associated reactive endplate change, greater on the right. Moderate bilateral facet arthrosis.  No significant spinal stenosis. Mild right L2 foraminal narrowing. L3-4: Left eccentric disc bulge with reactive endplate change. Moderate left worse than right facet arthrosis. Superimposed tumor obliterates the left neural foramen as above. Resultant severe left lateral recess stenosis and left foraminal narrowing. Mild-to-moderate right foraminal narrowing. L4-5: Anterolisthesis with broad posterior pseudo disc bulge and severe facet arthrosis. New soft tissue density within the left ventral epidural space concerning for possible tumor (series 4, image 31). Resultant moderate canal and right lateral recess stenosis, with progressive severe narrowing of the left lateral recess. Mild right with severe left L4 foraminal stenosis. L5-S1: Disc desiccation with minimal annular bulge. Severe right with moderate left facet arthrosis. No significant spinal stenosis. Foramina remain patent. IMPRESSION: 1. Persistent T1 hypointense enhancing lesion involving the left aspects of the L3 and L4 vertebral bodies, with progressive involvement of the central and right aspects of both vertebral bodies since prior, consistent with progressive disease. No pathologic fracture. 2. Interval increase in size of left paraspinal soft tissue mass at the level of L3-4, with involvement of the adjacent left L3-4 and L4-5 neural foramina. Epidural involvement involving the left epidural space at L3-4 and L4-5 appears slightly worsened, with new involvement of the left ventral epidural space posterior to L5. Question intradural extension with involvement of the nerve roots of the cauda equina at L4-5 as above. 3. Underlying sigmoid scoliosis with advanced multilevel degenerative spondylosis and facet arthrosis as above, otherwise stable from prior. 4. Cholelithiasis. Electronically Signed   By: Jeannine Boga M.D.   On: 02/12/2021 22:03   NM PET Image Restage (PS) Skull Base to Thigh (F-18 FDG)  Result Date: 02/13/2021 CLINICAL DATA:   Subsequent treatment strategy for recurrent endometrial cancer  and non-small cell right lung carcinoma. EXAM: NUCLEAR MEDICINE PET SKULL BASE TO THIGH TECHNIQUE: 5.4 mCi F-18 FDG was injected intravenously. Full-ring PET imaging was performed from the skull base to thigh after the radiotracer. CT data was obtained and used for attenuation correction and anatomic localization. Fasting blood glucose: 105 mg/dl COMPARISON:  11/15/2020 PET-CT. FINDINGS: Mediastinal blood pool activity: SUV max 2.6 Liver activity: SUV max NA NECK: No hypermetabolic lymph nodes in the neck. Relatively symmetric muscular activity throughout the bilateral neck, most compatible with activity related uptake. Incidental CT findings: Left internal jugular Port-A-Cath terminates in the middle third of the SVC. CHEST: No enlarged or hypermetabolic axillary, mediastinal or hilar lymph nodes. No hypermetabolic pulmonary findings. Incidental CT findings: Coronary atherosclerosis. Atherosclerotic nonaneurysmal thoracic aorta. Surgical clips throughout the right axilla. Stable postsurgical changes from wedge resection in the parahilar upper right lung. No new significant pulmonary nodules. ABDOMEN/PELVIS: No abnormal hypermetabolic activity within the liver, pancreas, adrenal glands, or spleen. No hypermetabolic lymph nodes in the abdomen or pelvis. Incidental CT findings: Cholelithiasis. Atherosclerotic nonaneurysmal abdominal aorta. Moderate sigmoid diverticulosis. Hysterectomy. SKELETON: L3 vertebral hypermetabolism with max SUV 10.4, relatively occult on the low-dose CT images, previous max SUV 14.1, mildly decreased in metabolism however clearly increased in extent since 11/15/2020 PET-CT. Left and superior L4 vertebral hypermetabolism with max SUV 6.8, previous max SUV 5.8, significantly increased in extent and mildly increased in metabolism since 11/15/2020 PET-CT. No additional sites of hypermetabolic skeletal disease. Incidental CT findings:  Long segment moderate levocurvature of the thoracolumbar spine. IMPRESSION: 1. Hypermetabolic L3 and L4 vertebral metastases are clearly increased in extent since 11/15/2020 PET-CT, with mild variable changes in max SUV levels as detailed. Favor progression of skeletal metastatic disease, as also described on concurrent MRI lumbar spine report. 2. No additional skeletal or extra-skeletal sites of hypermetabolic metastatic disease. 3. Chronic findings include: Aortic Atherosclerosis (ICD10-I70.0). Coronary atherosclerosis. Cholelithiasis. Moderate sigmoid diverticulosis. Electronically Signed   By: Ilona Sorrel M.D.   On: 02/13/2021 14:24

## 2021-02-15 NOTE — Assessment & Plan Note (Signed)
See above

## 2021-02-15 NOTE — Care Management Important Message (Signed)
Important Message  Patient Details IM Letter given to the Patient. Name: [name] MRN: [id number] Date of Birth: August 17, 1940   Medicare Important Message Given:  Yes     Kerin Salen 02/15/2021, 1:59 PM

## 2021-02-15 NOTE — Assessment & Plan Note (Signed)
-   Consult oncology - Consult Palliative care inpatient for pain management - Consult palliative care after discharge for goals of care

## 2021-02-15 NOTE — Assessment & Plan Note (Signed)
As evidenced by moderate loss of subcutaneous muscle mass and fat, and 10% weight loss in the last 2 months.

## 2021-02-15 NOTE — Assessment & Plan Note (Addendum)
-   Consult wound care - There is the option to maintain the Foley at discharge, as family have expressed uncertainty that they can keep the patient dry from stool and urine incontinence at home

## 2021-02-15 NOTE — Assessment & Plan Note (Addendum)
Blood pressure controlled - Continue atenolol, hydralazine

## 2021-02-15 NOTE — Assessment & Plan Note (Signed)
-   Supplemented and resolved 

## 2021-02-15 NOTE — Telephone Encounter (Signed)
Called referral for palliative care to [facility] [place]. Told [name] husband will be the contact person and she will be d/ced from the hospital tomorrow or over the the weekend. [name] verbalized understanding and will contact husband.

## 2021-02-15 NOTE — Assessment & Plan Note (Signed)
Continue Xarelto 

## 2021-02-15 NOTE — Progress Notes (Signed)
Progress Note    [name]   KPT:[id number]  DOB: 03/17/1941  DOA: 02/12/2021     2 Date of Service: 02/15/2021    Brief summary: [name] is an 80 y.o. F with history DVT on Xarelto, endometrial cancer, progression despite second line treatment, now metastatic to the spine who presented to oncology clinic with left lower extremity numbness, incontinence, and low back pain as well as weight loss and was direct admitted for emergent palliative radiation therapy.   10/3 direct admitted, started on dexamethasone 10/4 rad Onc consulted 10/5 started radiation this day        Subjective:  Still numb in the left leg, pain is quite adequately controlled.  No fever, confusion, loss of appetite.  Hospital Problems * Lumbar cord compression (HCC) Able to ambulate - Continue palliative radiation therapy  - Continue Dexamethasone, continue at current dose at discharge - PRN oxycodone - Maintain foley     Oncology have asked [place] to keep the patient until Friday, or through her entire radiation treatment (I believe ten treatments) if family cannot transport patient to radiation (as she is ambulatory, I assume they can, although I will defer this discussion to [name] and PT)     Metastatic cancer to spine ([name])  See above  Endometrial cancer [name]) - Consult oncology - Consult Palliative care inpatient for pain management - Consult palliative care after discharge for goals of care  Deep vein blood clot of right lower extremity (HCC) - Continue Xarelto  Hypomagnesemia Supplemented and resolved  Hypokalemia Supplemented and resolved  Protein-calorie malnutrition, severe ([name]) As evidenced by moderate loss of subcutaneous muscle mass and fat, and 10% weight loss in the last 2 months.  Dementia without behavioral disturbance (HCC) - Continue memantine, donepezil  Pressure injury of buttock, stage 2 (HCC) - Consult wound care - There is the option to maintain the Foley  at discharge, as family have expressed uncertainty that they can keep the patient dry from stool and urine incontinence at home  Anemia, chronic disease Hgb stable relative to abseline  Acquired hypothyroidism -Continue levothyroxine  Cerebrovascular disease - Continue simvastatin, atenolol  Essential hypertension Blood pressure controlled - Continue atenolol, hydralazine    Aortic atherosclerosis (HCC) - Continue simvastatin, atenolol     Objective Vital signs were reviewed and unremarkable.  Vitals:   02/14/21 0632 02/14/21 2228 02/15/21 0511 02/15/21 1339  BP: (!) 143/82 (!) 149/83 137/70 130/73  Pulse: 65 80 73 66  Resp: 16 16 16 15   Temp: 97.9 F (36.6 C) 98.2 F (36.8 C) 98.2 F (36.8 C) 98.2 F (36.8 C)  TempSrc:  Oral Oral Oral  SpO2: 98% 97% 97% 98%  Weight:      Height:       47.5 kg  Exam General appearance: Thin elderly female, lying in bed, no acute distress     HEENT:    Skin:  Cardiac: RRR, no murmurs, no lower extremity edema Respiratory: Normal respiratory rate and rhythm, lungs clear without rales or wheezes Abdomen: Abdomen soft without tenderness palpation or guarding, no ascites or distention MSK:  Neuro: Awake and alert, extraocular movements intact, face symmetric, speech fluent, sensation loss in the left leg, strength normal in bilateral lower extremities. Psych: Attention normal, affect appropriate, judgment insight impaired by dementia    Labs / Other Information My review of labs, imaging, notes and other tests is significant for Metabolic panel normal     Time spent: 25 minutes [facility] 02/15/2021,  8:36 PM

## 2021-02-15 NOTE — Evaluation (Addendum)
Physical Therapy Evaluation Patient Details Name: [name] MRN: [id number] DOB: 02-Mar-1941 Today's Date: 02/15/2021  History of Present Illness  [name] is a 80 y.o. female from cancer center due to worsening LLE weakness. PET scan shows cancer getting worse and lumbar spinal met now likely causing cord compression. MRI confirms spinal cord / nerve root compression in L spine by Tumor. PMH: HTN, endometrial CA with mets to lumbar spine.   Clinical Impression  Pt admitted with above diagnosis. Pt using SPC/RW at home as needed, active with OPPT, spouse at bedside able to assist as needed but wants pt to remain as independent at possible. Pt requires increased time to sit up at EOB, abel to self assist LLE back into bed when returning to supine. Pt ambulates 180 ft with RW, step through pattern and cadence WFL, 1 left knee buckling incident without LOB or physical assist required to recover. Pt educated on mobilizing with nursing as able, will continue to progress acute PT as able and recommend return to OPPT with spouse at home to assist as needed. Pt currently with functional limitations due to the deficits listed below (see PT Problem List). Pt will benefit from skilled PT to increase their independence and safety with mobility to allow discharge to the venue listed below.          Recommendations for follow up therapy are one component of a multi-disciplinary discharge planning process, led by the attending physician.  Recommendations may be updated based on patient status, additional functional criteria and insurance authorization.  Follow Up Recommendations Outpatient PT;Supervision - Intermittent    Equipment Recommendations  None recommended by PT    Recommendations for Other Services       Precautions / Restrictions Precautions Precautions: Fall Restrictions Weight Bearing Restrictions: No      Mobility  Bed Mobility Overal bed mobility: Modified Independent   General bed mobility comments: increased time to come to sitting EOB, self assist LLE back into bed    Transfers Overall transfer level: Needs assistance Equipment used: Rolling walker (2 wheeled) Transfers: Sit to/from Stand Sit to Stand: Supervision  General transfer comment: supv for safety, powers to stand with UE assist, good steadiness upon rising  Ambulation/Gait Ambulation/Gait assistance: Supervision Gait Distance (Feet): 180 Feet Assistive device: Rolling walker (2 wheeled) Gait Pattern/deviations: Step-through pattern;Decreased stride length Gait velocity: WFL   General Gait Details: 1 Left knee buckling incident without LOB or physical assist required, step through pattern with RW and good cadence, denies pain or fatigue  Stairs            Wheelchair Mobility    Modified Rankin (Stroke Patients Only)       Balance Overall balance assessment: Needs assistance  Sitting balance-Leahy Scale: Good Sitting balance - Comments: seated EOB  Standing balance support: During functional activity Standing balance-Leahy Scale: Fair Standing balance comment: static without UE support, dynamic with RW       Pertinent Vitals/Pain Pain Assessment: No/denies pain    Home Living Family/patient expects to be discharged to:: Private residence Living Arrangements: Spouse/significant other Available Help at Discharge: Family;Available 24 hours/day Type of Home: House Home Access: Stairs to enter Entrance Stairs-Rails: Doctor, general practice of Steps: 5-6 Home Layout: One level Home Equipment: Walker - 2 wheels;Shower seat;Cane - single point      Prior Function Level of Independence: Independent with assistive device(s)  Comments: Pt using SPC or RW as needed, independent with self care, active with OPPT  Hand Dominance        Extremity/Trunk Assessment   Upper Extremity Assessment Upper Extremity Assessment: Overall WFL for tasks assessed     Lower Extremity Assessment Lower Extremity Assessment: Overall WFL for tasks assessed (AROM WNL, strength grossly 4-/5, symmetrical, denies numbness/tingling throughout)    Cervical / Trunk Assessment Cervical / Trunk Assessment: Normal  Communication   Communication: No difficulties  Cognition Arousal/Alertness: Awake/alert Behavior During Therapy: WFL for tasks assessed/performed Overall Cognitive Status: Within Functional Limits for tasks assessed     General Comments      Exercises     Assessment/Plan    PT Assessment Patient needs continued PT services  PT Problem List Decreased activity tolerance;Decreased balance;Decreased mobility       PT Treatment Interventions DME instruction;Gait training;Stair training;Functional mobility training;Therapeutic activities;Therapeutic exercise;Balance training;Neuromuscular re-education;Patient/family education    PT Goals (Current goals can be found in the Care Plan section)  Acute Rehab PT Goals Patient Stated Goal: return to OPPT PT Goal Formulation: With patient/family Time For Goal Achievement: 03/01/21 Potential to Achieve Goals: Good    Frequency Min 3X/week   Barriers to discharge        Co-evaluation               AM-PAC PT "6 Clicks" Mobility  Outcome Measure Help needed turning from your back to your side while in a flat bed without using bedrails?: None Help needed moving from lying on your back to sitting on the side of a flat bed without using bedrails?: None Help needed moving to and from a bed to a chair (including a wheelchair)?: A Little Help needed standing up from a chair using your arms (e.g., wheelchair or bedside chair)?: A Little Help needed to walk in hospital room?: A Little Help needed climbing 3-5 steps with a railing? : A Little 6 Click Score: 20    End of Session Equipment Utilized During Treatment: Gait belt Activity Tolerance: Patient tolerated treatment well Patient left: in  bed;with call bell/phone within reach Nurse Communication: Mobility status PT Visit Diagnosis: Other abnormalities of gait and mobility (R26.89);Difficulty in walking, not elsewhere classified (R26.2)    Time: 8251-8984 PT Time Calculation (min) (ACUTE ONLY): 21 min   Charges:   PT Evaluation $PT Eval Moderate Complexity: 1 Mod           Tori Broussard PT, DPT 02/15/21, 2:44 PM

## 2021-02-15 NOTE — Assessment & Plan Note (Signed)
Continue levothyroxine 

## 2021-02-16 ENCOUNTER — Ambulatory Visit: Payer: Medicare Other | Admitting: Physical Therapy

## 2021-02-16 ENCOUNTER — Ambulatory Visit
Admit: 2021-02-16 | Discharge: 2021-02-16 | Disposition: A | Discharge status: Home / Self Care | Payer: Medicare Other | Attending: Radiation Oncology | Admitting: Radiation Oncology

## 2021-02-16 DIAGNOSIS — G9520 Unspecified cord compression: Secondary | ICD-10-CM | POA: Diagnosis not present

## 2021-02-16 DIAGNOSIS — E039 Hypothyroidism, unspecified: Secondary | ICD-10-CM | POA: Diagnosis not present

## 2021-02-16 DIAGNOSIS — I700 Atherosclerosis of aorta: Secondary | ICD-10-CM | POA: Diagnosis not present

## 2021-02-16 DIAGNOSIS — F0390 Unspecified dementia without behavioral disturbance: Secondary | ICD-10-CM

## 2021-02-16 DIAGNOSIS — D638 Anemia in other chronic diseases classified elsewhere: Secondary | ICD-10-CM | POA: Diagnosis not present

## 2021-02-16 MED ORDER — OXYCODONE HCL 5 MG PO TABS: 5.0000 mg | ORAL_TABLET | Freq: Four times a day (QID) | ORAL | 0 refills | Status: DC | PRN

## 2021-02-16 MED ORDER — ZINC OXIDE 40 % EX OINT: TOPICAL_OINTMENT | CUTANEOUS | 0 refills | Status: AC | PRN

## 2021-02-16 MED ORDER — DEXAMETHASONE 4 MG PO TABS: 4.0000 mg | ORAL_TABLET | Freq: Every day | ORAL | 0 refills | Status: DC

## 2021-02-16 MED ORDER — LOPERAMIDE HCL 2 MG PO CAPS
4.0000 mg | ORAL_CAPSULE | Freq: Three times a day (TID) | ORAL | 2 refills | Status: AC
Start: 2021-02-16 — End: ?

## 2021-02-16 MED ORDER — HEPARIN SOD (PORK) LOCK FLUSH 100 UNIT/ML IV SOLN
500.0000 [IU] | INTRAVENOUS | Status: AC | PRN
  Administered 2021-02-16: 500 [IU]
  Filled 2021-02-16: qty 5

## 2021-02-16 NOTE — Progress Notes (Signed)
[place] [address] [facility]) Hospital Liaison note:  This is a pending outpatient-based Palliative Care patient. Will continue to follow for disposition.  Please call with any outpatient palliative questions or concerns.  Thank you, Lorelee Market, LPN [facility] Liaison [phone]

## 2021-02-16 NOTE — Progress Notes (Signed)
Reviewed written d/c instructions w both pt and her husband and all questions answered. They both verbalized understanding. Pt has voided incontinently since her foley was removed, unable to measure. D/C per w/c w all belongings in stable condition.

## 2021-02-16 NOTE — Plan of Care (Signed)

## 2021-02-16 NOTE — TOC Transition Note (Addendum)
Transition of Care [facility]) - CM/SW Discharge Note   Patient Details  Name: [name] MRN: [id number] Date of Birth: 1941/04/08  Transition of [facility]) CM/SW Contact:  Leeroy Cha, RN Phone Number: 02/16/2021, 1:12 PM   Clinical Narrative:    Patient being dcd to home with hhc.  Referral sent to [facility] for rn and pt.   Center well accepted  Final next level of care: [place] Barriers to Discharge: No Barriers Identified   Patient Goals and CMS Choice Patient states their goals for this hospitalization and ongoing recovery are:: to go home CMS Medicare.gov Compare Post Acute Care list provided to:: Patient    Discharge Placement                       Discharge Plan and Services   Discharge Planning Services: CM Consult Post Acute Care Choice: Home Health                    HH Arranged: RN, PT [facility] Agency: [place] Date [facility] Agency Contacted: 02/16/21 Time [place]: [id number] Representative spoke with at [place]: [place] ([place]) Interventions     Readmission Risk Interventions No flowsheet data found.

## 2021-02-16 NOTE — Consult Note (Signed)
WOC met with patient and her husband.  Answered questions related to MASD (moisture associated skin damage) Reinforced need to keep patient DRY as possible at all times. Reinforced need to use zinc based barrier cream to affected areas. Reinforced that current wound status, being partial thickness that could have resulted from sheer as well as pressure is more likely when skin is "waterlogged" with urine or stool. Discussed use of barrier cream for 10-15 minutes including options such as Desitin, Bourduex's butt cream, zinc and hydrocortisone compounds which is what I have currently ordered inpatient and explained rationale to the patient and her husband.  Patient has dementia but still reports that she knows when she has to urinate and have a a bowel movement despite known neurological deficits from the metastatic lesions on the spine.   Options reviewed Continue to use depends incontinent briefs however I have stress importance that as soon as that patient is wet this needs to be changed, skin cleansed and barrier cream reapplied. Caregivers can not leave patient in a wet or soiled brief for hours on end, the skin will worsen. Ok to use zinc based barrier over intact and non intact skin, if wound worsens would need to consider other topical care especially for the wound Turn from side to side at least every two hours.  Possible use of external female urinary management system (Purewick) I will provide husband with literature on this product and its home use. Explained to patient and husband this is not for use when she is ambulating. Sample of [place] brought to room to show patient and husband. Stressed importance of repositioning and making sure no pressure from the device on the perineal areas (particularly discussing the area between the labia and the anus) Provided pressure redistribution chair pad for patient when she is up in the chair, cover with pillow case or sheet at home. DO NOT sit directly  on the cushion Patient and husband seemed to understand concepts of care explained. Husband is making notes during our educational session.   [place], [place], [place]

## 2021-02-16 NOTE — Discharge Summary (Addendum)
Physician Discharge Summary  [name] EYC:[id number] DOB: 1941/02/17 DOA: 02/12/2021  PCP: Shon Baton, MD  Admit date: 02/12/2021 Discharge date: 02/16/2021  Admitted From: Home Disposition: Home  Recommendations for Outpatient Follow-up:  Follow up with PCP in 1 week with repeat CBC/BMP Outpatient follow-up with oncology and radiation oncology.  Keep scheduled appointment. Outpatient evaluation and follow-up by palliative care Might need outpatient wound care follow-up. Follow up in ED if symptoms worsen or new appear   Home Health: No Equipment/Devices: None  Discharge Condition: Guarded CODE STATUS: Full Diet recommendation: Heart healthy  Brief/Interim Summary: 80 year old female with history of DVT on Xarelto, endometrial cancer with mets to spine and progression despite second line of treatment presented to oncology clinic with left lower extremity numbness, incontinence and low back pain as well as weight loss.  She was admitted for emergent palliative radiation therapy.  She received radiation treatment during the hospitalization and was started on dexamethasone.  Oncology and palliative care were also involved.  Her back pain is improving.  She feels okay to go home today.  She will be discharged home today with close outpatient follow-up with oncology and radiation oncology and follow-up with palliative care as well.  Discharge Diagnoses:   Lumbar cord compression secondary to metastatic cancer to spine Endometrial cancer -Treated with IV Decadron in the hospital.  Also getting palliative radiation therapy. -Pain is improving.  She feels better with improved appetite and wants to go home today.  Oncology has cleared the patient for discharge home today.  Patient is not a candidate for further chemotherapy as per oncology. -Continue oral Decadron 4 mg daily as per oncology recommendations.  Continue outpatient follow-up with oncology and radiation oncology as scheduled  to continue palliative radiation treatments. -Overall prognosis is guarded to poor.  Continue outpatient follow-up with palliative care. -Foley catheter was placed during the hospitalization.  Patient wants the Foley catheter removed.  Will DC Foley catheter prior to discharge but if patient has urinary retention again, might have to replace it and will need outpatient urology follow-up.  Hypokalemia and hypomagnesemia -Resolved  Severe protein calorie malnutrition -Follow nutrition recommendations  Dementia without behavioral disturbance -Continue current regimen.  Outpatient follow-up with PCP  Stage II pressure injury of buttock: Present on admission -Continue wound care as per wound care nurse recommendations.  Foley catheter plan as above.  Patient aware that if Foley catheter is removed, she might have incontinence which might make the wound worse. -Might need outpatient wound care follow-up  Anemia of chronic disease -From cancer.  Hemoglobin stable  Hypothyroidism -Continue levothyroxine  Cerebrovascular disease -Continue simvastatin and atenolol  Essential hypertension -Continue atenolol and hydralazine.  Aortic atherosclerosis -Continue simvastatin and atenolol   Discharge Instructions  Discharge Instructions     Amb Referral to Palliative Care   Complete by: As directed    Goals of care   Ambulatory referral to Wound Clinic   Complete by: As directed    Diet - low sodium heart healthy   Complete by: As directed    Discharge wound care:   Complete by: As directed    Wound care as per Wound care RN's recommendations   Increase activity slowly   Complete by: As directed       Allergies as of 02/16/2021       Reactions   Ace Inhibitors Anaphylaxis   Shortness of breath   Dilaudid [hydromorphone Hcl] Other (See Comments)   "total loss of her mind"  Codeine Nausea Only        Medication List     STOP taking these medications    furosemide 20  MG tablet Commonly known as: LASIX   ibuprofen 800 MG tablet Commonly known as: ADVIL   lenvatinib 10 mg daily dose capsule Commonly known as: [place] these medications    atenolol 25 MG tablet Commonly known as: TENORMIN Take 25 mg by mouth every morning.   BIOTIN PO Take 500 mcg by mouth every morning.   cetirizine 10 MG tablet Commonly known as: ZYRTEC Take 10 mg by mouth every morning.   cholecalciferol 25 MCG (1000 UNIT) tablet Commonly known as: VITAMIN D3 Take 1,000 Units by mouth every morning.   dexamethasone 4 MG tablet Commonly known as: DECADRON Take 1 tablet (4 mg total) by mouth daily. Start taking on: February 17, 2021   donepezil 5 MG tablet Commonly known as: ARICEPT TAKE 1 TABLET BY MOUTH EVERYDAY AT BEDTIME What changed: See the new instructions.   hydrALAZINE 50 MG tablet Commonly known as: APRESOLINE Take 50 mg by mouth 2 (two) times daily. What changed: Another medication with the same name was removed. Continue taking this medication, and follow the directions you see here.   levothyroxine 50 MCG tablet Commonly known as: SYNTHROID TAKE 1 TABLET BY MOUTH DAILY BEFORE BREAKFAST   lidocaine-prilocaine cream Commonly known as: EMLA Apply 1 application topically daily as needed (prior to port access).   liver oil-zinc oxide 40 % ointment Commonly known as: DESITIN Apply topically as needed for irritation.   loperamide 2 MG capsule Commonly known as: IMODIUM Take 2 capsules (4 mg total) by mouth 3 (three) times daily.   memantine 5 MG tablet Commonly known as: NAMENDA TAKE 2 TABLETS BY MOUTH TWICE A DAY   mometasone 0.1 % ointment Commonly known as: ELOCON Apply 1 application topically daily as needed (rash/itching).   multivitamin with minerals Tabs tablet Take 1 tablet by mouth every morning.   ondansetron 8 MG tablet Commonly known as: ZOFRAN Take 8 mg by mouth 2 (two) times daily as needed for nausea or  vomiting.   oxyCODONE 5 MG immediate release tablet Commonly known as: Oxy IR/ROXICODONE Take 1 tablet (5 mg total) by mouth every 6 (six) hours as needed for severe pain.   Potassium 99 MG Tabs Take 99 mg by mouth every morning.   rivaroxaban 10 MG Tabs tablet Commonly known as: XARELTO Take 1 tablet (10 mg total) by mouth daily.   simvastatin 20 MG tablet Commonly known as: ZOCOR Take 20 mg by mouth every evening.   Vitamin B 12 500 MCG Tabs Take 500 mcg by mouth every morning.               Discharge Care Instructions  (From admission, onward)           Start     Ordered   02/16/21 0000  Discharge wound care:       Comments: Wound care as per Wound care RN's recommendations   02/16/21 1008              Follow-up Information     Shon Baton, MD. Schedule an appointment as soon as possible for a visit in 1 week(s).   Specialty: Internal Medicine Contact information: [place] [address] [phone]         Heath Lark, MD. Schedule an appointment as soon as possible for a visit  in 1 week(s).   Specialty: Hematology and Oncology Contact information: [place] [address] [phone]                Allergies  Allergen Reactions   Ace Inhibitors Anaphylaxis    Shortness of breath    Dilaudid [Hydromorphone Hcl] Other (See Comments)    "total loss of her mind"   Codeine Nausea Only    Consultations: Oncology/palliative care  Procedures/Studies: MR Cervical Spine W or Wo Contrast  Result Date: 02/12/2021 CLINICAL DATA:  Initial evaluation for endometrial carcinoma, metastatic evaluation. Frequent falls. EXAM: MRI CERVICAL SPINE WITHOUT AND WITH CONTRAST TECHNIQUE: Multiplanar and multiecho pulse sequences of the cervical spine, to include the craniocervical junction and cervicothoracic junction, were obtained without and with intravenous contrast. CONTRAST:  49mL GADAVIST GADOBUTROL  1 MMOL/ML IV SOLN COMPARISON:  Comparison made with PET-CT performed earlier the same day. FINDINGS: Alignment: Straightening of the normal cervical lordosis. Grade 1 anterolisthesis of C2 on C3 and C7 on T1, chronic and degenerative. Vertebrae: Vertebral body height maintained without acute or chronic fracture. Bone marrow signal intensity within normal limits. No discrete or worrisome osseous lesions. No evidence for osseous metastatic disease within the cervical spine. No evidence for osteomyelitis discitis or septic arthritis. Mild reactive marrow edema about the left C2-3 facet due to facet arthritis. Cord: Normal signal and morphology. No epidural or intradural tumor. No abnormal enhancement. Posterior Fossa, vertebral arteries, paraspinal tissues: T2/STIR signal abnormality within the visualized pons, likely related to chronic microvascular ischemic disease. Visualized brain and posterior fossa otherwise unremarkable. Craniocervical junction normal. Paraspinous and prevertebral soft tissues within normal limits. Normal flow voids seen within the vertebral arteries bilaterally. 5 mm right thyroid nodule noted, of doubtful significance given size and patient age, no follow-up imaging recommended (ref: J Am Coll Radiol. 2015 Feb;12(2): 143-50). Disc levels: C2-C3: Anterolisthesis. Mild disc bulge with prominent left-sided facet degeneration. No spinal stenosis. Foramina remain patent. C3-C4: Degenerative intervertebral disc space narrowing with diffuse disc osteophyte complex. Flattening of the ventral thecal sac with resultant mild spinal stenosis. Moderate right C4 foraminal narrowing. Left neural foramina remains patent. C4-C5: Degenerative intervertebral disc space narrowing with diffuse disc osteophyte complex. Broad posterior component flattens and partially faces the ventral thecal sac. Superimposed facet and ligament flavum hypertrophy. Resultant moderate spinal stenosis with minimal cord flattening, but  no cord signal changes. Moderate right C5 foraminal narrowing. Left neural foramina remains patent. C5-C6: Degenerative intervertebral disc space narrowing with diffuse disc osteophyte complex. Broad posterior component flattens and effaces the ventral thecal sac, asymmetric to the right. Resultant moderate spinal stenosis with mild cord flattening, worse on the right. No cord signal changes. Severe right with moderate left C6 foraminal narrowing. C6-C7: Degenerative intervertebral disc space narrowing with diffuse disc osteophyte complex. Flattening and partial effacement of the ventral thecal sac with resultant mild spinal stenosis. Moderate bilateral C7 foraminal narrowing. C7-T1: Anterolisthesis with mild disc bulge. Moderate bilateral facet arthrosis. No significant spinal stenosis. Foramina remain patent. IMPRESSION: 1. No evidence for metastatic disease within the cervical spine. 2. Moderate multilevel cervical spondylosis with resultant mild to moderate diffuse spinal stenosis at C3-4 through C6-7. 3. Multifactorial degenerative changes with resultant multilevel foraminal narrowing as above. Notable findings include moderate right C4 and C5 foraminal narrowing, severe right with moderate left C6 foraminal stenosis, with moderate bilateral C7 foraminal narrowing. Electronically Signed   By: Jeannine Boga M.D.   On: 02/12/2021 20:43   MR THORACIC SPINE W WO  CONTRAST  Result Date: 02/12/2021 CLINICAL DATA:  Initial evaluation for endometrial carcinoma, metastatic evaluation. Frequent falls. EXAM: MRI THORACIC WITHOUT AND WITH CONTRAST TECHNIQUE: Multiplanar and multiecho pulse sequences of the thoracic spine were obtained without and with intravenous contrast. CONTRAST:  12mL GADAVIST GADOBUTROL 1 MMOL/ML IV SOLN COMPARISON:  PET-CT from earlier the same day. FINDINGS: Alignment: Thoracolumbar scoliosis partially visualized. Trace degenerative anterolisthesis of T2 on T3. Vertebrae: Vertebral body  height maintained without acute or chronic fracture. Bone marrow signal intensity diffusely heterogeneous. Few scattered benign hemangiomata noted, most prominent of which measures 1.5 cm within the T8 vertebral body. No worrisome osseous lesions. No evidence for osseous metastatic disease. No evidence for osteomyelitis discitis or septic arthritis. Cord: Normal signal and morphology. No epidural or intradural tumor. No abnormal enhancement. Paraspinal and other soft tissues: Paraspinous soft tissues demonstrate no acute or significant finding. Partially visualized lungs are grossly clear. 5 mm right thyroid nodule partially visualized, of doubtful significance given size and patient age, no follow-up imaging recommended (ref: J Am Coll Radiol. 2015 Feb;12(2): 143-50).Remainder the visualized visceral structures demonstrate no other significant finding. Disc levels: T9-10: Right paracentral disc protrusion indents the right ventral thecal sac (series 26, image 12). Mild flattening of the right ventral cord without cord signal changes or significant spinal stenosis. Otherwise, ordinary for age degenerative disc disease and facet hypertrophy seen elsewhere throughout the thoracic spine. No other significant spinal stenosis. Foramina remain patent. IMPRESSION: 1. No evidence for metastatic disease within the thoracic spine. 2. Right paracentral disc protrusion at T9-10 with mild flattening of the right hemi cord. No cord signal changes or significant stenosis. Electronically Signed   By: Jeannine Boga M.D.   On: 02/12/2021 21:20   MR Lumbar Spine W Wo Contrast  Result Date: 02/12/2021 CLINICAL DATA:  Initial evaluation for history of endometrial carcinoma, metastatic evaluation, frequent falls. EXAM: MRI LUMBAR SPINE WITHOUT AND WITH CONTRAST TECHNIQUE: Multiplanar and multiecho pulse sequences of the lumbar spine were obtained without and with intravenous contrast. CONTRAST:  11mL GADAVIST GADOBUTROL 1  MMOL/ML IV SOLN COMPARISON:  Prior PET-CT from earlier the same day as well as previous MRI from 11/25/2020. FINDINGS: Segmentation: Standard. Lowest well-formed disc space labeled the L5-S1 level. Alignment: Advanced sigmoid scoliotic curvature of the thoracolumbar spine. 6 mm anterolisthesis of L4 on L5, with trace retrolisthesis of T12 on L1 and L3 on L4. Appearance is stable. Vertebrae: Again seen is abnormal T1 hypointense enhancing lesion involving the left aspect of the L3 and L4 vertebral bodies, concerning for malignancy. Extension to involve the left pedicles and posterior elements of L3 and L4, similar to perhaps mildly progressed. Additionally, abnormal T1 hypointensity now seen extending to involve the central and right aspect of the L3 and L4 vertebral bodies, increased from prior, and suggesting disease progression. Extraosseous extension with tumor seen obliterating the left L3-4 neural foramen, with extension to partially involve the left L4-5 neural foramen as well (series 1, images 11, 10). Probable early extension superiorly towards the left L2-3 neural foramen as well (series 1, image 13). Evidence for epidural involvement along the left and ventral aspect of the thecal sac extending from L3-4 through L4-5 (series 7, images 25-30). This appears slightly more prominent as compared to previous, with possible intradural extension at the level of L4-5 (series 7, image 29). Additionally, soft tissue density within the left ventral epidural space posterior to L5 is new from prior, and concerning for progressive epidural tumor (series 7, image 31). Soft tissue  mass involving the left paraspinous soft tissues at the level of L3-4 again seen, slightly increased in size measuring 2.7 x 1.7 cm (series 7, image 25, previously 2.3 x 1.5 cm when measured in a similar fashion on prior exam). Associated edema and enhancement within the adjacent left psoas muscle without frank soft tissue lesion. Vertebral body  height maintained without interval pathologic fracture. Reactive edema about the T12-L1 interspace favored to be degenerative in nature, similar to prior. No other distant sites of osseous metastatic disease identified. Probable postradiation changes noted elsewhere within the lumbar spine. Conus medullaris and cauda equina: Conus extends to the L1-2 level. Conus itself appears normal. Question intradural extension of tumor with involvement of the left-sided nerve roots of the cauda equina at L4-5 (series 7, image 28). Paraspinal and other soft tissues: Slightly increased size of left-sided paraspinous soft tissue mass at L3-4 as above. Associated edema and enhancement within the adjacent left psoas muscle Tiny subcentimeter simple cyst noted within the right kidney. Probable cholelithiasis noted within the partially visualized gallbladder. Disc levels: T12-L1: Degenerative intervertebral disc space narrowing with disc bulge and reactive endplate spurring, greater on the right. Bilateral facet hypertrophy. No spinal stenosis. Mild right foraminal narrowing. Left neural foramina remains patent. L1-2: Degenerative intervertebral disc space narrowing with disc desiccation and diffuse disc bulge, asymmetric to the right. Associated right-sided reactive endplate change. Moderate right worse than left facet hypertrophy. Resultant moderate narrowing of the right lateral recess with mild-to-moderate right foraminal narrowing. Central canal and left neural foramina remain patent. L2-3: Degenerative intervertebral disc space narrowing with diffuse disc bulge and disc desiccation. Associated reactive endplate change, greater on the right. Moderate bilateral facet arthrosis. No significant spinal stenosis. Mild right L2 foraminal narrowing. L3-4: Left eccentric disc bulge with reactive endplate change. Moderate left worse than right facet arthrosis. Superimposed tumor obliterates the left neural foramen as above. Resultant  severe left lateral recess stenosis and left foraminal narrowing. Mild-to-moderate right foraminal narrowing. L4-5: Anterolisthesis with broad posterior pseudo disc bulge and severe facet arthrosis. New soft tissue density within the left ventral epidural space concerning for possible tumor (series 4, image 31). Resultant moderate canal and right lateral recess stenosis, with progressive severe narrowing of the left lateral recess. Mild right with severe left L4 foraminal stenosis. L5-S1: Disc desiccation with minimal annular bulge. Severe right with moderate left facet arthrosis. No significant spinal stenosis. Foramina remain patent. IMPRESSION: 1. Persistent T1 hypointense enhancing lesion involving the left aspects of the L3 and L4 vertebral bodies, with progressive involvement of the central and right aspects of both vertebral bodies since prior, consistent with progressive disease. No pathologic fracture. 2. Interval increase in size of left paraspinal soft tissue mass at the level of L3-4, with involvement of the adjacent left L3-4 and L4-5 neural foramina. Epidural involvement involving the left epidural space at L3-4 and L4-5 appears slightly worsened, with new involvement of the left ventral epidural space posterior to L5. Question intradural extension with involvement of the nerve roots of the cauda equina at L4-5 as above. 3. Underlying sigmoid scoliosis with advanced multilevel degenerative spondylosis and facet arthrosis as above, otherwise stable from prior. 4. Cholelithiasis. Electronically Signed   By: Jeannine Boga M.D.   On: 02/12/2021 22:03   NM PET Image Restage (PS) Skull Base to Thigh (F-18 FDG)  Result Date: 02/13/2021 CLINICAL DATA:  Subsequent treatment strategy for recurrent endometrial cancer and non-small cell right lung carcinoma. EXAM: NUCLEAR MEDICINE PET SKULL BASE TO THIGH TECHNIQUE:  5.4 mCi F-18 FDG was injected intravenously. Full-ring PET imaging was performed from the  skull base to thigh after the radiotracer. CT data was obtained and used for attenuation correction and anatomic localization. Fasting blood glucose: 105 mg/dl COMPARISON:  11/15/2020 PET-CT. FINDINGS: Mediastinal blood pool activity: SUV max 2.6 Liver activity: SUV max NA NECK: No hypermetabolic lymph nodes in the neck. Relatively symmetric muscular activity throughout the bilateral neck, most compatible with activity related uptake. Incidental CT findings: Left internal jugular Port-A-Cath terminates in the middle third of the SVC. CHEST: No enlarged or hypermetabolic axillary, mediastinal or hilar lymph nodes. No hypermetabolic pulmonary findings. Incidental CT findings: Coronary atherosclerosis. Atherosclerotic nonaneurysmal thoracic aorta. Surgical clips throughout the right axilla. Stable postsurgical changes from wedge resection in the parahilar upper right lung. No new significant pulmonary nodules. ABDOMEN/PELVIS: No abnormal hypermetabolic activity within the liver, pancreas, adrenal glands, or spleen. No hypermetabolic lymph nodes in the abdomen or pelvis. Incidental CT findings: Cholelithiasis. Atherosclerotic nonaneurysmal abdominal aorta. Moderate sigmoid diverticulosis. Hysterectomy. SKELETON: L3 vertebral hypermetabolism with max SUV 10.4, relatively occult on the low-dose CT images, previous max SUV 14.1, mildly decreased in metabolism however clearly increased in extent since 11/15/2020 PET-CT. Left and superior L4 vertebral hypermetabolism with max SUV 6.8, previous max SUV 5.8, significantly increased in extent and mildly increased in metabolism since 11/15/2020 PET-CT. No additional sites of hypermetabolic skeletal disease. Incidental CT findings: Long segment moderate levocurvature of the thoracolumbar spine. IMPRESSION: 1. Hypermetabolic L3 and L4 vertebral metastases are clearly increased in extent since 11/15/2020 PET-CT, with mild variable changes in max SUV levels as detailed. Favor  progression of skeletal metastatic disease, as also described on concurrent MRI lumbar spine report. 2. No additional skeletal or extra-skeletal sites of hypermetabolic metastatic disease. 3. Chronic findings include: Aortic Atherosclerosis (ICD10-I70.0). Coronary atherosclerosis. Cholelithiasis. Moderate sigmoid diverticulosis. Electronically Signed   By: Ilona Sorrel M.D.   On: 02/13/2021 14:24      Subjective: Patient seen and examined at bedside.  Husband present at bedside.  Patient feels okay to go home today after radiation treatment.  No overnight fever, vomiting reported.  Back pain is improving.  Appetite is improving.  Discharge Exam: Vitals:   02/15/21 2128 02/16/21 0549  BP: 120/68 (!) 161/88  Pulse: 72 71  Resp: 16 18  Temp: 98.2 F (36.8 C) 98.6 F (37 C)  SpO2: 98% 98%    General: Pt is alert, awake, not in acute distress.  Indwelling Foley catheter present. Cardiovascular: rate controlled, S1/S2 + Respiratory: bilateral decreased breath sounds at bases Abdominal: Soft, NT, ND, bowel sounds + Extremities: no edema, no cyanosis    The results of significant diagnostics from this hospitalization (including imaging, microbiology, ancillary and laboratory) are listed below for reference.     Microbiology: Recent Results (from the past 240 hour(s))  Resp Panel by RT-PCR (Flu A&B, Covid) Nasopharyngeal Swab     Status: None   Collection Time: 02/12/21  8:48 PM   Specimen: Nasopharyngeal Swab; Nasopharyngeal(NP) swabs in vial transport medium  Result Value Ref Range Status   SARS Coronavirus 2 by RT PCR NEGATIVE NEGATIVE Final    Comment: (NOTE) SARS-CoV-2 target nucleic acids are NOT DETECTED.  The SARS-CoV-2 RNA is generally detectable in upper respiratory specimens during the acute phase of infection. The lowest concentration of SARS-CoV-2 viral copies this assay can detect is 138 copies/mL. A negative result does not preclude SARS-Cov-2 infection and should  not be used as the sole basis for treatment or  other patient management decisions. A negative result may occur with  improper specimen collection/handling, submission of specimen other than nasopharyngeal swab, presence of viral mutation(s) within the areas targeted by this assay, and inadequate number of viral copies(<138 copies/mL). A negative result must be combined with clinical observations, patient history, and epidemiological information. The expected result is Negative.  Fact Sheet for Patients:  EntrepreneurPulse.com.au  Fact Sheet for Healthcare Providers:  IncredibleEmployment.be  This test is no t yet approved or cleared by the [place] FDA and  has been authorized for detection and/or diagnosis of SARS-CoV-2 by FDA under an Emergency Use Authorization (EUA). This EUA will remain  in effect (meaning this test can be used) for the duration of the COVID-19 declaration under Section 564(b)(1) of the Act, 21 U.S.C.section 360bbb-3(b)(1), unless the authorization is terminated  or revoked sooner.       Influenza A by PCR NEGATIVE NEGATIVE Final   Influenza B by PCR NEGATIVE NEGATIVE Final    Comment: (NOTE) The Xpert Xpress SARS-CoV-2/FLU/RSV plus assay is intended as an aid in the diagnosis of influenza from Nasopharyngeal swab specimens and should not be used as a sole basis for treatment. Nasal washings and aspirates are unacceptable for Xpert Xpress SARS-CoV-2/FLU/RSV testing.  Fact Sheet for Patients: EntrepreneurPulse.com.au  Fact Sheet for Healthcare Providers: IncredibleEmployment.be  This test is not yet approved or cleared by the [place] FDA and has been authorized for detection and/or diagnosis of SARS-CoV-2 by FDA under an Emergency Use Authorization (EUA). This EUA will remain in effect (meaning this test can be used) for the duration of the COVID-19 declaration under  Section 564(b)(1) of the Act, 21 U.S.C. section 360bbb-3(b)(1), unless the authorization is terminated or revoked.  Performed at [facility], [place] [address] [place],  [address]      Labs: BNP (last 3 results) No results for input(s): BNP in the last 8760 hours. Basic Metabolic Panel: Recent Labs  Lab 02/12/21 1616 02/12/21 2108 02/12/21 2149 02/13/21 0433 02/15/21 0338  NA 139  --  138 139 139  K 3.7  --  3.4* 3.8 4.1  CL 102  --  101 104 107  CO2 21*  --  24 25 27   GLUCOSE 110*  --  94 131* 162*  BUN 20  --  20 20 31*  CREATININE 0.91  --  0.91 0.98 0.79  CALCIUM 9.2  --  9.2 8.6* 9.0  MG  --  1.5*  --   --  2.0   Liver Function Tests: Recent Labs  Lab 02/12/21 1616 02/12/21 2149  AST 41* 39  ALT 31 28  ALKPHOS 54 43  BILITOT 0.8 1.1  PROT 6.8 7.0  ALBUMIN 3.4* 3.6   No results for input(s): LIPASE, AMYLASE in the last 168 hours. No results for input(s): AMMONIA in the last 168 hours. CBC: Recent Labs  Lab 02/12/21 1616 02/13/21 0433  WBC 7.4 4.6  NEUTROABS 5.3  --   HGB 13.1 11.9*  HCT 39.5 35.5*  MCV 96.8 97.3  PLT 193 150   Cardiac Enzymes: No results for input(s): CKTOTAL, CKMB, CKMBINDEX, TROPONINI in the last 168 hours. BNP: Invalid input(s): POCBNP CBG: Recent Labs  Lab 02/12/21 1354  GLUCAP 105*   D-Dimer No results for input(s): DDIMER in the last 72 hours. Hgb A1c No results for input(s): HGBA1C in the last 72 hours. Lipid Profile No results for input(s): CHOL, HDL, LDLCALC, TRIG, CHOLHDL, LDLDIRECT in the last 72 hours.  Thyroid function studies No results for input(s): TSH, T4TOTAL, T3FREE, THYROIDAB in the last 72 hours.  Invalid input(s): FREET3 Anemia work up No results for input(s): VITAMINB12, FOLATE, FERRITIN, TIBC, IRON, RETICCTPCT in the last 72 hours. Urinalysis    Component Value Date/Time   COLORURINE YELLOW 07/26/2019 0843   APPEARANCEUR CLEAR 07/26/2019 0843   LABSPEC 1.013  07/26/2019 0843   LABSPEC 1.020 05/18/2013 0949   PHURINE 5.0 07/26/2019 0843   GLUCOSEU NEGATIVE 07/26/2019 0843   GLUCOSEU Negative 05/18/2013 0949   HGBUR SMALL (A) 07/26/2019 0843   BILIRUBINUR NEGATIVE 07/26/2019 0843   BILIRUBINUR Negative 05/18/2013 [place] 07/26/2019 0843   PROTEINUR 30 (A) 01/23/2021 0955   UROBILINOGEN 0.2 05/18/2013 0949   NITRITE NEGATIVE 07/26/2019 0843   LEUKOCYTESUR TRACE (A) 07/26/2019 0843   LEUKOCYTESUR Large 05/18/2013 0949   Sepsis Labs Invalid input(s): PROCALCITONIN,  WBC,  LACTICIDVEN Microbiology Recent Results (from the past 240 hour(s))  Resp Panel by RT-PCR (Flu A&B, Covid) Nasopharyngeal Swab     Status: None   Collection Time: 02/12/21  8:48 PM   Specimen: Nasopharyngeal Swab; Nasopharyngeal(NP) swabs in vial transport medium  Result Value Ref Range Status   SARS Coronavirus 2 by RT PCR NEGATIVE NEGATIVE Final    Comment: (NOTE) SARS-CoV-2 target nucleic acids are NOT DETECTED.  The SARS-CoV-2 RNA is generally detectable in upper respiratory specimens during the acute phase of infection. The lowest concentration of SARS-CoV-2 viral copies this assay can detect is 138 copies/mL. A negative result does not preclude SARS-Cov-2 infection and should not be used as the sole basis for treatment or other patient management decisions. A negative result may occur with  improper specimen collection/handling, submission of specimen other than nasopharyngeal swab, presence of viral mutation(s) within the areas targeted by this assay, and inadequate number of viral copies(<138 copies/mL). A negative result must be combined with clinical observations, patient history, and epidemiological information. The expected result is Negative.  Fact Sheet for Patients:  EntrepreneurPulse.com.au  Fact Sheet for Healthcare Providers:  IncredibleEmployment.be  This test is no t yet approved or cleared  by the [place] FDA and  has been authorized for detection and/or diagnosis of SARS-CoV-2 by FDA under an Emergency Use Authorization (EUA). This EUA will remain  in effect (meaning this test can be used) for the duration of the COVID-19 declaration under Section 564(b)(1) of the Act, 21 U.S.C.section 360bbb-3(b)(1), unless the authorization is terminated  or revoked sooner.       Influenza A by PCR NEGATIVE NEGATIVE Final   Influenza B by PCR NEGATIVE NEGATIVE Final    Comment: (NOTE) The Xpert Xpress SARS-CoV-2/FLU/RSV plus assay is intended as an aid in the diagnosis of influenza from Nasopharyngeal swab specimens and should not be used as a sole basis for treatment. Nasal washings and aspirates are unacceptable for Xpert Xpress SARS-CoV-2/FLU/RSV testing.  Fact Sheet for Patients: EntrepreneurPulse.com.au  Fact Sheet for Healthcare Providers: IncredibleEmployment.be  This test is not yet approved or cleared by the [place] FDA and has been authorized for detection and/or diagnosis of SARS-CoV-2 by FDA under an Emergency Use Authorization (EUA). This EUA will remain in effect (meaning this test can be used) for the duration of the COVID-19 declaration under Section 564(b)(1) of the Act, 21 U.S.C. section 360bbb-3(b)(1), unless the authorization is terminated or revoked.  Performed at [facility], [place] [address], [place], [place] [address]      Time coordinating discharge: 35 minutes  SIGNED:   Aline August, MD  [facility] 02/16/2021, 10:48 AM

## 2021-02-19 ENCOUNTER — Other Ambulatory Visit: Payer: Self-pay

## 2021-02-19 ENCOUNTER — Ambulatory Visit
Admission: RE | Admit: 2021-02-19 | Discharge: 2021-02-19 | Disposition: A | Discharge status: Home / Self Care | Payer: Medicare Other | Source: Ambulatory Visit | Attending: Radiation Oncology | Admitting: Radiation Oncology

## 2021-02-19 ENCOUNTER — Telehealth: Payer: Self-pay

## 2021-02-19 DIAGNOSIS — C549 Malignant neoplasm of corpus uteri, unspecified: Secondary | ICD-10-CM | POA: Diagnosis not present

## 2021-02-19 DIAGNOSIS — C7951 Secondary malignant neoplasm of bone: Secondary | ICD-10-CM | POA: Diagnosis not present

## 2021-02-19 DIAGNOSIS — Z510 Encounter for antineoplastic radiation therapy: Secondary | ICD-10-CM | POA: Diagnosis not present

## 2021-02-19 DIAGNOSIS — C541 Malignant neoplasm of endometrium: Secondary | ICD-10-CM | POA: Diagnosis not present

## 2021-02-19 NOTE — Telephone Encounter (Signed)
(  4;42 pm) SW scheduled initial palliative care visit with patient's husband [name]. RN/SW scheduled initial visit for 02/26/21 @ 12 pm.

## 2021-02-20 ENCOUNTER — Ambulatory Visit
Admission: RE | Admit: 2021-02-20 | Discharge: 2021-02-20 | Disposition: A | Discharge status: Home / Self Care | Payer: Medicare Other | Source: Ambulatory Visit | Attending: Radiation Oncology | Admitting: Radiation Oncology

## 2021-02-20 DIAGNOSIS — C7951 Secondary malignant neoplasm of bone: Secondary | ICD-10-CM | POA: Diagnosis not present

## 2021-02-20 DIAGNOSIS — C541 Malignant neoplasm of endometrium: Secondary | ICD-10-CM | POA: Diagnosis not present

## 2021-02-20 DIAGNOSIS — Z510 Encounter for antineoplastic radiation therapy: Secondary | ICD-10-CM | POA: Diagnosis not present

## 2021-02-20 DIAGNOSIS — C549 Malignant neoplasm of corpus uteri, unspecified: Secondary | ICD-10-CM | POA: Diagnosis not present

## 2021-02-21 ENCOUNTER — Ambulatory Visit
Admission: RE | Admit: 2021-02-21 | Discharge: 2021-02-21 | Disposition: A | Discharge status: Home / Self Care | Payer: Medicare Other | Source: Ambulatory Visit | Attending: Radiation Oncology | Admitting: Radiation Oncology

## 2021-02-21 ENCOUNTER — Ambulatory Visit: Payer: Medicare Other | Admitting: Rehabilitation

## 2021-02-21 ENCOUNTER — Other Ambulatory Visit: Payer: Self-pay

## 2021-02-21 DIAGNOSIS — R32 Unspecified urinary incontinence: Secondary | ICD-10-CM | POA: Diagnosis not present

## 2021-02-21 DIAGNOSIS — L89311 Pressure ulcer of right buttock, stage 1: Secondary | ICD-10-CM | POA: Diagnosis not present

## 2021-02-21 DIAGNOSIS — D61818 Other pancytopenia: Secondary | ICD-10-CM | POA: Diagnosis not present

## 2021-02-21 DIAGNOSIS — Z853 Personal history of malignant neoplasm of breast: Secondary | ICD-10-CM | POA: Diagnosis not present

## 2021-02-21 DIAGNOSIS — D630 Anemia in neoplastic disease: Secondary | ICD-10-CM | POA: Diagnosis not present

## 2021-02-21 DIAGNOSIS — G9520 Unspecified cord compression: Secondary | ICD-10-CM | POA: Diagnosis not present

## 2021-02-21 DIAGNOSIS — I493 Ventricular premature depolarization: Secondary | ICD-10-CM | POA: Diagnosis not present

## 2021-02-21 DIAGNOSIS — E039 Hypothyroidism, unspecified: Secondary | ICD-10-CM | POA: Diagnosis not present

## 2021-02-21 DIAGNOSIS — I700 Atherosclerosis of aorta: Secondary | ICD-10-CM | POA: Diagnosis not present

## 2021-02-21 DIAGNOSIS — C549 Malignant neoplasm of corpus uteri, unspecified: Secondary | ICD-10-CM | POA: Diagnosis not present

## 2021-02-21 DIAGNOSIS — C541 Malignant neoplasm of endometrium: Secondary | ICD-10-CM | POA: Diagnosis not present

## 2021-02-21 DIAGNOSIS — M5416 Radiculopathy, lumbar region: Secondary | ICD-10-CM | POA: Diagnosis not present

## 2021-02-21 DIAGNOSIS — L299 Pruritus, unspecified: Secondary | ICD-10-CM | POA: Diagnosis not present

## 2021-02-21 DIAGNOSIS — C7951 Secondary malignant neoplasm of bone: Secondary | ICD-10-CM | POA: Diagnosis not present

## 2021-02-21 DIAGNOSIS — Z510 Encounter for antineoplastic radiation therapy: Secondary | ICD-10-CM | POA: Diagnosis not present

## 2021-02-21 DIAGNOSIS — T451X5D Adverse effect of antineoplastic and immunosuppressive drugs, subsequent encounter: Secondary | ICD-10-CM | POA: Diagnosis not present

## 2021-02-21 DIAGNOSIS — E7800 Pure hypercholesterolemia, unspecified: Secondary | ICD-10-CM | POA: Diagnosis not present

## 2021-02-21 DIAGNOSIS — G8929 Other chronic pain: Secondary | ICD-10-CM | POA: Diagnosis not present

## 2021-02-21 DIAGNOSIS — Z86718 Personal history of other venous thrombosis and embolism: Secondary | ICD-10-CM | POA: Diagnosis not present

## 2021-02-21 DIAGNOSIS — Z85118 Personal history of other malignant neoplasm of bronchus and lung: Secondary | ICD-10-CM | POA: Diagnosis not present

## 2021-02-21 DIAGNOSIS — M722 Plantar fascial fibromatosis: Secondary | ICD-10-CM | POA: Diagnosis not present

## 2021-02-21 DIAGNOSIS — I10 Essential (primary) hypertension: Secondary | ICD-10-CM | POA: Diagnosis not present

## 2021-02-21 DIAGNOSIS — E43 Unspecified severe protein-calorie malnutrition: Secondary | ICD-10-CM | POA: Diagnosis not present

## 2021-02-21 DIAGNOSIS — Z9181 History of falling: Secondary | ICD-10-CM | POA: Diagnosis not present

## 2021-02-21 DIAGNOSIS — G620 Drug-induced polyneuropathy: Secondary | ICD-10-CM | POA: Diagnosis not present

## 2021-02-21 DIAGNOSIS — L89321 Pressure ulcer of left buttock, stage 1: Secondary | ICD-10-CM | POA: Diagnosis not present

## 2021-02-21 DIAGNOSIS — F0280 Dementia in other diseases classified elsewhere without behavioral disturbance: Secondary | ICD-10-CM | POA: Diagnosis not present

## 2021-02-22 ENCOUNTER — Other Ambulatory Visit: Payer: Self-pay | Admitting: Radiation Oncology

## 2021-02-22 ENCOUNTER — Ambulatory Visit
Admission: RE | Admit: 2021-02-22 | Discharge: 2021-02-22 | Disposition: A | Discharge status: Home / Self Care | Payer: Medicare Other | Source: Ambulatory Visit | Attending: Radiation Oncology | Admitting: Radiation Oncology

## 2021-02-22 DIAGNOSIS — C549 Malignant neoplasm of corpus uteri, unspecified: Secondary | ICD-10-CM | POA: Diagnosis not present

## 2021-02-22 DIAGNOSIS — C541 Malignant neoplasm of endometrium: Secondary | ICD-10-CM | POA: Diagnosis not present

## 2021-02-22 DIAGNOSIS — Z510 Encounter for antineoplastic radiation therapy: Secondary | ICD-10-CM | POA: Diagnosis not present

## 2021-02-22 DIAGNOSIS — C7951 Secondary malignant neoplasm of bone: Secondary | ICD-10-CM | POA: Diagnosis not present

## 2021-02-22 MED ORDER — MORPHINE SULFATE ER 15 MG PO TBCR: 15.0000 mg | EXTENDED_RELEASE_TABLET | Freq: Two times a day (BID) | ORAL | 0 refills | Status: AC

## 2021-02-23 ENCOUNTER — Ambulatory Visit
Admission: RE | Admit: 2021-02-23 | Discharge: 2021-02-23 | Disposition: A | Discharge status: Home / Self Care | Payer: Medicare Other | Source: Ambulatory Visit | Attending: Radiation Oncology | Admitting: Radiation Oncology

## 2021-02-23 ENCOUNTER — Ambulatory Visit: Payer: Medicare Other

## 2021-02-23 ENCOUNTER — Other Ambulatory Visit: Payer: Self-pay

## 2021-02-23 DIAGNOSIS — C7951 Secondary malignant neoplasm of bone: Secondary | ICD-10-CM | POA: Diagnosis not present

## 2021-02-23 DIAGNOSIS — C541 Malignant neoplasm of endometrium: Secondary | ICD-10-CM | POA: Diagnosis not present

## 2021-02-23 DIAGNOSIS — C549 Malignant neoplasm of corpus uteri, unspecified: Secondary | ICD-10-CM | POA: Diagnosis not present

## 2021-02-23 DIAGNOSIS — Z510 Encounter for antineoplastic radiation therapy: Secondary | ICD-10-CM | POA: Diagnosis not present

## 2021-02-26 ENCOUNTER — Encounter: Payer: Self-pay | Admitting: Hematology and Oncology

## 2021-02-26 ENCOUNTER — Other Ambulatory Visit: Payer: Self-pay

## 2021-02-26 ENCOUNTER — Telehealth: Payer: Self-pay | Admitting: Oncology

## 2021-02-26 ENCOUNTER — Other Ambulatory Visit: Payer: Medicare Other | Admitting: *Deleted

## 2021-02-26 ENCOUNTER — Ambulatory Visit
Admission: RE | Admit: 2021-02-26 | Discharge: 2021-02-26 | Disposition: A | Discharge status: Home / Self Care | Payer: Medicare Other | Source: Ambulatory Visit | Attending: Radiation Oncology | Admitting: Radiation Oncology

## 2021-02-26 ENCOUNTER — Other Ambulatory Visit: Payer: Medicare Other

## 2021-02-26 VITALS — BP 123/69 | HR 68 | Temp 97.6°F | Resp 18

## 2021-02-26 DIAGNOSIS — Z515 Encounter for palliative care: Secondary | ICD-10-CM

## 2021-02-26 DIAGNOSIS — C549 Malignant neoplasm of corpus uteri, unspecified: Secondary | ICD-10-CM | POA: Diagnosis not present

## 2021-02-26 DIAGNOSIS — C541 Malignant neoplasm of endometrium: Secondary | ICD-10-CM | POA: Diagnosis not present

## 2021-02-26 DIAGNOSIS — C7951 Secondary malignant neoplasm of bone: Secondary | ICD-10-CM | POA: Diagnosis not present

## 2021-02-26 DIAGNOSIS — Z510 Encounter for antineoplastic radiation therapy: Secondary | ICD-10-CM | POA: Diagnosis not present

## 2021-02-26 NOTE — Progress Notes (Signed)
[place] PALLIATIVE [facility] RN NOTE  PATIENT NAME: [name] DOB: [birth year] MRN: [id number]  PRIMARY CARE PROVIDER: Shon Baton, MD  RESPONSIBLE PARTY: Spouse Acct ID - Guarantor Home Phone Work Phone Relationship Acct Type  [id number] [name], [name] [phone]  Self P/F     [name], [name], [name]   Covid-19 Pre-screening negative  PLAN OF CARE and INTERVENTION:  ADVANCE CARE PLANNING/GOALS OF CARE: Goal is for patient to maintain her current strength. She wants to remain as active as possible. She has a DNR. PATIENT/CAREGIVER EDUCATION: Explained palliative care services, symptom management, pain management, safe mobility DISEASE STATUS: Joint initial visit made with LCSW, M. Lonon. Met with patient, husband, daughter and son-in-law in the home. Patient's other daughter also present during visit via telephone. Upon arrival, patient is sitting up on the couch. She is alert and oriented x 4. Pleasant and engaging. Patient has a diagnosis of endometrial cancer with mets to her spine. She was recently hospitalized from 02/12/21 to 02/16/21 due to left lower extremity numbness, incontinence and lower back pain. She was found to have a lumbar cord compression secondary to her cancer. She underwent emergent palliative radiation therapy and decadron was continued. She has stopped all other treatments due to cancer progression despite that receiving treatment. She is not a candidate for further chemotherapy. She denies pain at this time but does experience a stabbing pain in her left lower extremity with movement and weight bearing. Husband states she was tearful this morning, but pain has subsided. She is currently taking Morphine Sulfate ER every 12 hours. She feels that this regimen is working well. She is ambulatory using her walker. She does require assistance with dressing, especially with putting her pants on and tieing her shoes. She is giving herself sponge  baths since returning home from the hospital. She does not feel safe in the shower as of yet. She has a shower stool and I recommended a hand-held shower head. She is currently working with home health. She is unsure of which company she is receiving this through. The RN visited for the first time last week and will visit again at [place] on 02/28/21. PT will be coming tomorrow for an evaluation. Daughter states that they spoke with Dr. Virgina Jock regarding a wheelchair for patient and he sent information to [facility]. Her appetite is currently good, husband states since she is on the steroids. She is eating 3 meals/day. She is working on drinking more fluids. Denies dysphagia and taking medications without difficulty. Her bowel movements have been regular. She has not experiencing any more issues with diarrhea so has been able to stop taking Imodium. She is sleeping well during the night. They are agreeable to future visits with palliative care.   HISTORY OF PRESENT ILLNESS: This is a 80 yo female with a diagnosis of endometrial cancer with mets to the spine causing lumbar cord compression. She has completed palliative radiation. She has a past medical history of dementia, hypertension, aortic atherosclerosis, anemia, protein calorie malnutrition and hypothyroidism. Palliative care team has been asked to follow patient for additional support, goals of care and complex decision making.  CODE STATUS: DNR ADVANCED DIRECTIVES: Y MOST FORM: no PPS: 60%   PHYSICAL EXAM:   VITALS: Today's Vitals   02/26/21 1250  BP: 123/69  Pulse: 68  Resp: 18  Temp: 97.6 F (36.4 C)  TempSrc: Temporal  SpO2: 96%  PainSc: 0-No pain    LUNGS: clear to auscultation  CARDIAC: Cor RRR EXTREMITIES: No edema SKIN:  Reddened scabbed area noted to her left outer ear; Exposed skin is dry and intact   NEURO:  Alert and oriented x 4, pleasant mood, generalized weakness, ambulatory w/walker   (Duration of visit and  documentation 60 minutes)   Daryl Eastern, RN BSN

## 2021-02-26 NOTE — Telephone Encounter (Signed)
Called [name] to see if they have established with palliative care.  He said [facility] is scheduled for a home visit today.

## 2021-02-26 NOTE — Progress Notes (Signed)
[facility] SW NOTE  PATIENT NAME: [name] DOB: [birth year] MRN: [id number]  PRIMARY CARE PROVIDER: Shon Baton, MD  RESPONSIBLE PARTY:  Acct ID - Guarantor Home Phone Work Phone Relationship Acct Type  [id number] [name], [name]* [phone]  Self P/F     [place], [place], [place] [address]     PLAN OF CARE and INTERVENTIONS:             GOALS OF CARE/ ADVANCE CARE PLANNING:  Goal is for patient remain in her home, maintain her strength to be able to walk and be active. Patient is a DNR. SOCIAL/EMOTIONAL/SPIRITUAL ASSESSMENT/ INTERVENTIONS:  SW and RN-M. Nadara Mustard completed an initial visit with patient at her home. She was present with her husband, daughter and son-in-law. Education was provided regarding palliative care services and a verbal consent was provided. Patient and family provided a status update on patient. Patient is diagnosed with endometrial cancer with mets to the spin and was hospitalized from 10/3 to 10/7. Patient reported that patient has numbness and pain to her left leg and side. Patient has pain that she describes as "stabbing pain". Patient has extended relief pain medication that she feels is working, but does not kick in until 20-30 minutes later. Patient's husband assist her showers and dressing, although patient has only had sponge baths since returning home from the hospital. Patient uses a walker to ambulate. Her appetite remains good and she has no swallowing issues. Patient is scheduled to have a physical therapy evaluation tomorrow and the home health nurse is scheduled to visit on Wednesday. For safety and care, RN recommended patient discuss occupational therapy consult with the therapist and consider using a hand-held shower head to shower. SOCIAL HISTORY: Patient was born and raised in [place], [place]. 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 She has been married since 1964. She has  two daughters. She is Tourist information centre manager by faith and is member of [employer] where she was very active. Patient has advanced directives to include a living will, HCPOA/POA and DNR. Patient and family remain open to ongoing palliative care support.  PATIENT/CAREGIVER EDUCATION/ COPING:  Patient is alert and oriented x 4. She was receptions to education and support provided. She has a supportive family and network of friends. Patient also has a positive outlook regarding her condition.  PERSONAL EMERGENCY PLAN:  911 can be activated for emergencies.  COMMUNITY RESOURCES COORDINATION/ HEALTH CARE NAVIGATION:  Patient is scheduled to have a PT consult and RN through home health  FINANCIAL/LEGAL CONCERNS/INTERVENTIONS: None     SOCIAL HX:  Social History   Tobacco Use   Smoking status: Former    Types: Cigarettes    Quit date: 05/13/1962    Years since quitting: 58.8   Smokeless tobacco: Never  Substance Use Topics   Alcohol use: Yes    Comment: occasionally    CODE STATUS: DNR ADVANCED DIRECTIVES: Yes MOST FORM COMPLETE:  No HOSPICE EDUCATION PROVIDED: Yes, education provided  PPS: Patient is alert and oriented x 4 and ambulates with a walker. She needs assistance with personal care needs.  Duration of visit and documentation: 60 minutes  Katheren Puller, LCSW

## 2021-02-27 ENCOUNTER — Encounter: Payer: Self-pay | Admitting: Hematology and Oncology

## 2021-02-27 ENCOUNTER — Ambulatory Visit
Admission: RE | Admit: 2021-02-27 | Discharge: 2021-02-27 | Disposition: A | Discharge status: Home / Self Care | Payer: Medicare Other | Source: Ambulatory Visit | Attending: Radiation Oncology | Admitting: Radiation Oncology

## 2021-02-27 ENCOUNTER — Other Ambulatory Visit: Payer: Self-pay | Admitting: Radiation Oncology

## 2021-02-27 DIAGNOSIS — D630 Anemia in neoplastic disease: Secondary | ICD-10-CM | POA: Diagnosis not present

## 2021-02-27 DIAGNOSIS — M5416 Radiculopathy, lumbar region: Secondary | ICD-10-CM | POA: Diagnosis not present

## 2021-02-27 DIAGNOSIS — C7951 Secondary malignant neoplasm of bone: Secondary | ICD-10-CM | POA: Diagnosis not present

## 2021-02-27 DIAGNOSIS — C541 Malignant neoplasm of endometrium: Secondary | ICD-10-CM | POA: Diagnosis not present

## 2021-02-27 DIAGNOSIS — Z510 Encounter for antineoplastic radiation therapy: Secondary | ICD-10-CM | POA: Diagnosis not present

## 2021-02-27 DIAGNOSIS — L89311 Pressure ulcer of right buttock, stage 1: Secondary | ICD-10-CM | POA: Diagnosis not present

## 2021-02-27 DIAGNOSIS — G9520 Unspecified cord compression: Secondary | ICD-10-CM | POA: Diagnosis not present

## 2021-02-27 DIAGNOSIS — C549 Malignant neoplasm of corpus uteri, unspecified: Secondary | ICD-10-CM | POA: Diagnosis not present

## 2021-02-27 MED ORDER — OXYCODONE HCL 5 MG PO TABS: 5.0000 mg | ORAL_TABLET | Freq: Four times a day (QID) | ORAL | 0 refills | Status: AC | PRN

## 2021-02-28 ENCOUNTER — Telehealth: Payer: Self-pay

## 2021-02-28 ENCOUNTER — Ambulatory Visit: Payer: Medicare Other | Admitting: Rehabilitation

## 2021-02-28 DIAGNOSIS — L89311 Pressure ulcer of right buttock, stage 1: Secondary | ICD-10-CM | POA: Diagnosis not present

## 2021-02-28 DIAGNOSIS — C541 Malignant neoplasm of endometrium: Secondary | ICD-10-CM | POA: Diagnosis not present

## 2021-02-28 DIAGNOSIS — C7951 Secondary malignant neoplasm of bone: Secondary | ICD-10-CM | POA: Diagnosis not present

## 2021-02-28 DIAGNOSIS — G9520 Unspecified cord compression: Secondary | ICD-10-CM | POA: Diagnosis not present

## 2021-02-28 DIAGNOSIS — M5416 Radiculopathy, lumbar region: Secondary | ICD-10-CM | POA: Diagnosis not present

## 2021-02-28 DIAGNOSIS — D630 Anemia in neoplastic disease: Secondary | ICD-10-CM | POA: Diagnosis not present

## 2021-02-28 NOTE — Telephone Encounter (Signed)
Pt's Daughter, Jerene Pitch called and states pt's pain is not controlled with Morphine/ Oxycodone. Pt had a fall yesterday. Palliative care came out yesterday for eval, but daughter would prefer hospice nurses to come in/ feels pt needs a CNA to help with ADLs and nurse to come in to manage pain meds.  This nurse attempted to call Ferne Reus nurse, who's VM box is full. Daughter expressed frustration that she is not able to get in touch with [facility]. MD advises pt to have re-eval for hospice and if pain is not controlled to go to ED or inpt hospice. Jerene Pitch states she would like to keep mother home if possible.   I spoke with pt who states pain is 4/10, "manageable". Pt states, "I am doing fine and the pain medications are working, I think my daughter thinks I am lying to her".   Daughter will call with any further concerns. Message routed to POD so they can f/u with palliative care.

## 2021-03-01 ENCOUNTER — Encounter: Payer: Self-pay | Admitting: Hematology and Oncology

## 2021-03-01 ENCOUNTER — Telehealth: Payer: Self-pay

## 2021-03-01 NOTE — Telephone Encounter (Signed)
Hi [name],  Can you call her husband today and verify her current situation?

## 2021-03-01 NOTE — Telephone Encounter (Signed)
Called husband. He said, if you ask [name] she will say that she is doing fine even if she is having pain. Husband said yesterday she cried when she got up due to the pain. She is still in the bed today asleep. They are requesting hospice services and he would like to be the contact person.  Called [facility] and given verbal order for hospice. They will contact husband today about starting hospice services.  Called husband back and told referral for hospice services given and they will contact him. He is grateful for the call and will share the above info with his daughter. Instructed to call the office back if needed. He verbalized understanding.

## 2021-03-02 ENCOUNTER — Ambulatory Visit: Payer: Medicare Other | Admitting: Physical Therapy

## 2021-03-02 DIAGNOSIS — R634 Abnormal weight loss: Secondary | ICD-10-CM | POA: Diagnosis not present

## 2021-03-02 DIAGNOSIS — J302 Other seasonal allergic rhinitis: Secondary | ICD-10-CM | POA: Diagnosis not present

## 2021-03-02 DIAGNOSIS — Z85118 Personal history of other malignant neoplasm of bronchus and lung: Secondary | ICD-10-CM | POA: Diagnosis not present

## 2021-03-02 DIAGNOSIS — E785 Hyperlipidemia, unspecified: Secondary | ICD-10-CM | POA: Diagnosis not present

## 2021-03-02 DIAGNOSIS — C7951 Secondary malignant neoplasm of bone: Secondary | ICD-10-CM | POA: Diagnosis not present

## 2021-03-02 DIAGNOSIS — I10 Essential (primary) hypertension: Secondary | ICD-10-CM | POA: Diagnosis not present

## 2021-03-02 DIAGNOSIS — C541 Malignant neoplasm of endometrium: Secondary | ICD-10-CM | POA: Diagnosis not present

## 2021-03-02 DIAGNOSIS — F0390 Unspecified dementia without behavioral disturbance: Secondary | ICD-10-CM | POA: Diagnosis not present

## 2021-03-02 DIAGNOSIS — E039 Hypothyroidism, unspecified: Secondary | ICD-10-CM | POA: Diagnosis not present

## 2021-03-05 ENCOUNTER — Other Ambulatory Visit: Payer: Self-pay | Admitting: Hematology and Oncology

## 2021-03-05 ENCOUNTER — Telehealth: Payer: Self-pay

## 2021-03-05 DIAGNOSIS — G9520 Unspecified cord compression: Secondary | ICD-10-CM

## 2021-03-05 DIAGNOSIS — R634 Abnormal weight loss: Secondary | ICD-10-CM | POA: Diagnosis not present

## 2021-03-05 DIAGNOSIS — C541 Malignant neoplasm of endometrium: Secondary | ICD-10-CM | POA: Diagnosis not present

## 2021-03-05 DIAGNOSIS — C7951 Secondary malignant neoplasm of bone: Secondary | ICD-10-CM | POA: Diagnosis not present

## 2021-03-05 DIAGNOSIS — F0390 Unspecified dementia without behavioral disturbance: Secondary | ICD-10-CM | POA: Diagnosis not present

## 2021-03-05 DIAGNOSIS — I10 Essential (primary) hypertension: Secondary | ICD-10-CM | POA: Diagnosis not present

## 2021-03-05 DIAGNOSIS — E785 Hyperlipidemia, unspecified: Secondary | ICD-10-CM | POA: Diagnosis not present

## 2021-03-05 MED ORDER — DEXAMETHASONE 4 MG PO TABS: 4.0000 mg | ORAL_TABLET | Freq: Every day | ORAL | 0 refills | Status: AC

## 2021-03-05 NOTE — Telephone Encounter (Signed)
Keep dexamethasone as is I will send refill to her pharmacy

## 2021-03-05 NOTE — Telephone Encounter (Signed)
Received call from Evie Lacks, RN with hospice stating Dr Lyman Speller increased pt's MS contin to 30 mg 1 t BID. Almyra Free asks if MD wants to keep dexamethasone at the same dose, if so pt needs refill. Routed to MD to advise.

## 2021-03-05 NOTE — Telephone Encounter (Signed)
Almyra Free, RN aware and will let the family know.

## 2021-03-06 DIAGNOSIS — E785 Hyperlipidemia, unspecified: Secondary | ICD-10-CM | POA: Diagnosis not present

## 2021-03-06 DIAGNOSIS — C7951 Secondary malignant neoplasm of bone: Secondary | ICD-10-CM | POA: Diagnosis not present

## 2021-03-06 DIAGNOSIS — C541 Malignant neoplasm of endometrium: Secondary | ICD-10-CM | POA: Diagnosis not present

## 2021-03-06 DIAGNOSIS — R634 Abnormal weight loss: Secondary | ICD-10-CM | POA: Diagnosis not present

## 2021-03-06 DIAGNOSIS — I10 Essential (primary) hypertension: Secondary | ICD-10-CM | POA: Diagnosis not present

## 2021-03-06 DIAGNOSIS — F0390 Unspecified dementia without behavioral disturbance: Secondary | ICD-10-CM | POA: Diagnosis not present

## 2021-03-07 ENCOUNTER — Ambulatory Visit: Payer: Medicare Other | Admitting: Rehabilitation

## 2021-03-08 DIAGNOSIS — I10 Essential (primary) hypertension: Secondary | ICD-10-CM | POA: Diagnosis not present

## 2021-03-08 DIAGNOSIS — E785 Hyperlipidemia, unspecified: Secondary | ICD-10-CM | POA: Diagnosis not present

## 2021-03-08 DIAGNOSIS — C541 Malignant neoplasm of endometrium: Secondary | ICD-10-CM | POA: Diagnosis not present

## 2021-03-08 DIAGNOSIS — F0390 Unspecified dementia without behavioral disturbance: Secondary | ICD-10-CM | POA: Diagnosis not present

## 2021-03-08 DIAGNOSIS — C7951 Secondary malignant neoplasm of bone: Secondary | ICD-10-CM | POA: Diagnosis not present

## 2021-03-08 DIAGNOSIS — R634 Abnormal weight loss: Secondary | ICD-10-CM | POA: Diagnosis not present

## 2021-03-09 ENCOUNTER — Ambulatory Visit: Payer: Medicare Other | Admitting: Physical Therapy

## 2021-03-09 NOTE — Telephone Encounter (Signed)
Received order for hospice eval. Referral center notified

## 2021-03-13 DIAGNOSIS — C7951 Secondary malignant neoplasm of bone: Secondary | ICD-10-CM | POA: Diagnosis not present

## 2021-03-13 DIAGNOSIS — I10 Essential (primary) hypertension: Secondary | ICD-10-CM | POA: Diagnosis not present

## 2021-03-13 DIAGNOSIS — R634 Abnormal weight loss: Secondary | ICD-10-CM | POA: Diagnosis not present

## 2021-03-13 DIAGNOSIS — C541 Malignant neoplasm of endometrium: Secondary | ICD-10-CM | POA: Diagnosis not present

## 2021-03-13 DIAGNOSIS — E785 Hyperlipidemia, unspecified: Secondary | ICD-10-CM | POA: Diagnosis not present

## 2021-03-13 DIAGNOSIS — E039 Hypothyroidism, unspecified: Secondary | ICD-10-CM | POA: Diagnosis not present

## 2021-03-13 DIAGNOSIS — J302 Other seasonal allergic rhinitis: Secondary | ICD-10-CM | POA: Diagnosis not present

## 2021-03-13 DIAGNOSIS — Z85118 Personal history of other malignant neoplasm of bronchus and lung: Secondary | ICD-10-CM | POA: Diagnosis not present

## 2021-03-13 DIAGNOSIS — F0390 Unspecified dementia without behavioral disturbance: Secondary | ICD-10-CM | POA: Diagnosis not present

## 2021-03-14 ENCOUNTER — Ambulatory Visit: Payer: Medicare Other | Admitting: Rehabilitation

## 2021-03-14 DIAGNOSIS — I10 Essential (primary) hypertension: Secondary | ICD-10-CM | POA: Diagnosis not present

## 2021-03-14 DIAGNOSIS — E785 Hyperlipidemia, unspecified: Secondary | ICD-10-CM | POA: Diagnosis not present

## 2021-03-14 DIAGNOSIS — C541 Malignant neoplasm of endometrium: Secondary | ICD-10-CM | POA: Diagnosis not present

## 2021-03-14 DIAGNOSIS — R634 Abnormal weight loss: Secondary | ICD-10-CM | POA: Diagnosis not present

## 2021-03-14 DIAGNOSIS — F0390 Unspecified dementia without behavioral disturbance: Secondary | ICD-10-CM | POA: Diagnosis not present

## 2021-03-14 DIAGNOSIS — C7951 Secondary malignant neoplasm of bone: Secondary | ICD-10-CM | POA: Diagnosis not present

## 2021-03-16 ENCOUNTER — Telehealth: Payer: Self-pay

## 2021-03-16 ENCOUNTER — Ambulatory Visit: Payer: Medicare Other | Admitting: Physical Therapy

## 2021-03-16 DIAGNOSIS — C7951 Secondary malignant neoplasm of bone: Secondary | ICD-10-CM | POA: Diagnosis not present

## 2021-03-16 DIAGNOSIS — E785 Hyperlipidemia, unspecified: Secondary | ICD-10-CM | POA: Diagnosis not present

## 2021-03-16 DIAGNOSIS — I10 Essential (primary) hypertension: Secondary | ICD-10-CM | POA: Diagnosis not present

## 2021-03-16 DIAGNOSIS — C541 Malignant neoplasm of endometrium: Secondary | ICD-10-CM | POA: Diagnosis not present

## 2021-03-16 DIAGNOSIS — R634 Abnormal weight loss: Secondary | ICD-10-CM | POA: Diagnosis not present

## 2021-03-16 DIAGNOSIS — F0390 Unspecified dementia without behavioral disturbance: Secondary | ICD-10-CM | POA: Diagnosis not present

## 2021-03-16 NOTE — Telephone Encounter (Signed)
Called and given below message to hospice nurse, Almyra Free. She verbalized understanding and will speak with Mr. Mclaren to see what he wants to do.

## 2021-03-16 NOTE — Telephone Encounter (Signed)
Almyra Free, nurse with hospice called and left a message. [name] is having pitting edema to bilateral legs. +4 to left leg with weeping/ discomfort and +3 to right leg. She is asking for suggestions. Do you think it would be helpful to increase Hydralazine? Or other diuretics?

## 2021-03-17 DIAGNOSIS — F0390 Unspecified dementia without behavioral disturbance: Secondary | ICD-10-CM | POA: Diagnosis not present

## 2021-03-17 DIAGNOSIS — C541 Malignant neoplasm of endometrium: Secondary | ICD-10-CM | POA: Diagnosis not present

## 2021-03-17 DIAGNOSIS — E785 Hyperlipidemia, unspecified: Secondary | ICD-10-CM | POA: Diagnosis not present

## 2021-03-17 DIAGNOSIS — R634 Abnormal weight loss: Secondary | ICD-10-CM | POA: Diagnosis not present

## 2021-03-17 DIAGNOSIS — C7951 Secondary malignant neoplasm of bone: Secondary | ICD-10-CM | POA: Diagnosis not present

## 2021-03-17 DIAGNOSIS — I10 Essential (primary) hypertension: Secondary | ICD-10-CM | POA: Diagnosis not present

## 2021-03-18 DIAGNOSIS — E785 Hyperlipidemia, unspecified: Secondary | ICD-10-CM | POA: Diagnosis not present

## 2021-03-18 DIAGNOSIS — R634 Abnormal weight loss: Secondary | ICD-10-CM | POA: Diagnosis not present

## 2021-03-18 DIAGNOSIS — C541 Malignant neoplasm of endometrium: Secondary | ICD-10-CM | POA: Diagnosis not present

## 2021-03-18 DIAGNOSIS — C7951 Secondary malignant neoplasm of bone: Secondary | ICD-10-CM | POA: Diagnosis not present

## 2021-03-18 DIAGNOSIS — F0390 Unspecified dementia without behavioral disturbance: Secondary | ICD-10-CM | POA: Diagnosis not present

## 2021-03-18 DIAGNOSIS — I10 Essential (primary) hypertension: Secondary | ICD-10-CM | POA: Diagnosis not present

## 2021-03-19 ENCOUNTER — Telehealth: Payer: Self-pay

## 2021-03-19 DIAGNOSIS — E785 Hyperlipidemia, unspecified: Secondary | ICD-10-CM | POA: Diagnosis not present

## 2021-03-19 DIAGNOSIS — I10 Essential (primary) hypertension: Secondary | ICD-10-CM | POA: Diagnosis not present

## 2021-03-19 DIAGNOSIS — C541 Malignant neoplasm of endometrium: Secondary | ICD-10-CM | POA: Diagnosis not present

## 2021-03-19 DIAGNOSIS — C7951 Secondary malignant neoplasm of bone: Secondary | ICD-10-CM | POA: Diagnosis not present

## 2021-03-19 DIAGNOSIS — R634 Abnormal weight loss: Secondary | ICD-10-CM | POA: Diagnosis not present

## 2021-03-19 DIAGNOSIS — F0390 Unspecified dementia without behavioral disturbance: Secondary | ICD-10-CM | POA: Diagnosis not present

## 2021-03-19 NOTE — Telephone Encounter (Signed)
Called daughter back. [name] will be calling the office back with her assessment. The nurse did make a visit today. [name] has a pressure area and hemorrhoids.

## 2021-03-19 NOTE — Telephone Encounter (Addendum)
Returned daughters call. [name] was having severe rectal pain with wiping/ cleaning. Pain seems to be better today. Jerene Pitch is staying with her parents now to assist with care. Hospice is supposed to come out today for a visit.  Left message for Almyra Free with [facility] to assess at visit today and call the office back.  FYI

## 2021-03-20 ENCOUNTER — Telehealth: Payer: Self-pay

## 2021-03-20 DIAGNOSIS — C541 Malignant neoplasm of endometrium: Secondary | ICD-10-CM | POA: Diagnosis not present

## 2021-03-20 DIAGNOSIS — E785 Hyperlipidemia, unspecified: Secondary | ICD-10-CM | POA: Diagnosis not present

## 2021-03-20 DIAGNOSIS — I10 Essential (primary) hypertension: Secondary | ICD-10-CM | POA: Diagnosis not present

## 2021-03-20 DIAGNOSIS — F0390 Unspecified dementia without behavioral disturbance: Secondary | ICD-10-CM | POA: Diagnosis not present

## 2021-03-20 DIAGNOSIS — C7951 Secondary malignant neoplasm of bone: Secondary | ICD-10-CM | POA: Diagnosis not present

## 2021-03-20 DIAGNOSIS — R634 Abnormal weight loss: Secondary | ICD-10-CM | POA: Diagnosis not present

## 2021-03-20 NOTE — Telephone Encounter (Signed)
Hi Erin, in case her daughter called back. Her pressure sore and hemorrhoids were not new.

## 2021-03-21 ENCOUNTER — Ambulatory Visit: Payer: Medicare Other | Admitting: Rehabilitation

## 2021-03-21 DIAGNOSIS — C7951 Secondary malignant neoplasm of bone: Secondary | ICD-10-CM | POA: Diagnosis not present

## 2021-03-21 DIAGNOSIS — E785 Hyperlipidemia, unspecified: Secondary | ICD-10-CM | POA: Diagnosis not present

## 2021-03-21 DIAGNOSIS — I10 Essential (primary) hypertension: Secondary | ICD-10-CM | POA: Diagnosis not present

## 2021-03-21 DIAGNOSIS — R634 Abnormal weight loss: Secondary | ICD-10-CM | POA: Diagnosis not present

## 2021-03-21 DIAGNOSIS — F0390 Unspecified dementia without behavioral disturbance: Secondary | ICD-10-CM | POA: Diagnosis not present

## 2021-03-21 DIAGNOSIS — C541 Malignant neoplasm of endometrium: Secondary | ICD-10-CM | POA: Diagnosis not present

## 2021-03-22 DIAGNOSIS — C7951 Secondary malignant neoplasm of bone: Secondary | ICD-10-CM | POA: Diagnosis not present

## 2021-03-22 DIAGNOSIS — R634 Abnormal weight loss: Secondary | ICD-10-CM | POA: Diagnosis not present

## 2021-03-22 DIAGNOSIS — I10 Essential (primary) hypertension: Secondary | ICD-10-CM | POA: Diagnosis not present

## 2021-03-22 DIAGNOSIS — F0390 Unspecified dementia without behavioral disturbance: Secondary | ICD-10-CM | POA: Diagnosis not present

## 2021-03-22 DIAGNOSIS — E785 Hyperlipidemia, unspecified: Secondary | ICD-10-CM | POA: Diagnosis not present

## 2021-03-22 DIAGNOSIS — C541 Malignant neoplasm of endometrium: Secondary | ICD-10-CM | POA: Diagnosis not present

## 2021-03-23 ENCOUNTER — Ambulatory Visit: Payer: Medicare Other | Admitting: Physical Therapy

## 2021-03-27 ENCOUNTER — Encounter: Payer: Self-pay | Admitting: Radiation Oncology

## 2021-03-27 ENCOUNTER — Telehealth: Payer: Self-pay | Admitting: *Deleted

## 2021-03-27 DIAGNOSIS — C541 Malignant neoplasm of endometrium: Secondary | ICD-10-CM | POA: Diagnosis not present

## 2021-03-27 DIAGNOSIS — E785 Hyperlipidemia, unspecified: Secondary | ICD-10-CM | POA: Diagnosis not present

## 2021-03-27 DIAGNOSIS — F0390 Unspecified dementia without behavioral disturbance: Secondary | ICD-10-CM | POA: Diagnosis not present

## 2021-03-27 DIAGNOSIS — C7951 Secondary malignant neoplasm of bone: Secondary | ICD-10-CM | POA: Diagnosis not present

## 2021-03-27 DIAGNOSIS — I10 Essential (primary) hypertension: Secondary | ICD-10-CM | POA: Diagnosis not present

## 2021-03-27 DIAGNOSIS — R634 Abnormal weight loss: Secondary | ICD-10-CM | POA: Diagnosis not present

## 2021-03-27 NOTE — Telephone Encounter (Signed)
Contacted by Almyra Free, RN w/[facility]. Patient's b/p today 5/56, HR56. Currently taking Atenolol 25 mg BID and Hydralazine 50 mg BID.  RN advised patient to hold medications this evening. She would like to know if Dr. Alvy Bimler wants to make changes in these medications - parameters; hold; change dose/admin or d/c? Message routed to Dr. Alvy Bimler

## 2021-03-28 NOTE — Telephone Encounter (Signed)
Tell hospice nurse to stop all BP medications including atenolol and hydralazine Stop checking BP

## 2021-03-29 DIAGNOSIS — E785 Hyperlipidemia, unspecified: Secondary | ICD-10-CM | POA: Diagnosis not present

## 2021-03-29 DIAGNOSIS — C7951 Secondary malignant neoplasm of bone: Secondary | ICD-10-CM | POA: Diagnosis not present

## 2021-03-29 DIAGNOSIS — R634 Abnormal weight loss: Secondary | ICD-10-CM | POA: Diagnosis not present

## 2021-03-29 DIAGNOSIS — I10 Essential (primary) hypertension: Secondary | ICD-10-CM | POA: Diagnosis not present

## 2021-03-29 DIAGNOSIS — C541 Malignant neoplasm of endometrium: Secondary | ICD-10-CM | POA: Diagnosis not present

## 2021-03-29 DIAGNOSIS — F0390 Unspecified dementia without behavioral disturbance: Secondary | ICD-10-CM | POA: Diagnosis not present

## 2021-03-29 NOTE — Telephone Encounter (Signed)
Mcarthur Rossetti, RN with hospice and left detailed VM with verbal orders per MD to stop antihypertensive drugs and stop checking Bps. Brantley Fling, RN to call [facility] with any questions.

## 2021-03-30 DIAGNOSIS — C7951 Secondary malignant neoplasm of bone: Secondary | ICD-10-CM | POA: Diagnosis not present

## 2021-03-30 DIAGNOSIS — F0390 Unspecified dementia without behavioral disturbance: Secondary | ICD-10-CM | POA: Diagnosis not present

## 2021-03-30 DIAGNOSIS — R634 Abnormal weight loss: Secondary | ICD-10-CM | POA: Diagnosis not present

## 2021-03-30 DIAGNOSIS — I10 Essential (primary) hypertension: Secondary | ICD-10-CM | POA: Diagnosis not present

## 2021-03-30 DIAGNOSIS — C541 Malignant neoplasm of endometrium: Secondary | ICD-10-CM | POA: Diagnosis not present

## 2021-03-30 DIAGNOSIS — E785 Hyperlipidemia, unspecified: Secondary | ICD-10-CM | POA: Diagnosis not present

## 2021-03-31 DIAGNOSIS — C541 Malignant neoplasm of endometrium: Secondary | ICD-10-CM | POA: Diagnosis not present

## 2021-03-31 DIAGNOSIS — F0390 Unspecified dementia without behavioral disturbance: Secondary | ICD-10-CM | POA: Diagnosis not present

## 2021-03-31 DIAGNOSIS — I10 Essential (primary) hypertension: Secondary | ICD-10-CM | POA: Diagnosis not present

## 2021-03-31 DIAGNOSIS — R634 Abnormal weight loss: Secondary | ICD-10-CM | POA: Diagnosis not present

## 2021-03-31 DIAGNOSIS — C7951 Secondary malignant neoplasm of bone: Secondary | ICD-10-CM | POA: Diagnosis not present

## 2021-03-31 DIAGNOSIS — E785 Hyperlipidemia, unspecified: Secondary | ICD-10-CM | POA: Diagnosis not present

## 2021-04-02 ENCOUNTER — Ambulatory Visit: Payer: Self-pay | Admitting: Radiation Oncology

## 2021-04-02 DIAGNOSIS — C541 Malignant neoplasm of endometrium: Secondary | ICD-10-CM | POA: Diagnosis not present

## 2021-04-02 DIAGNOSIS — I10 Essential (primary) hypertension: Secondary | ICD-10-CM | POA: Diagnosis not present

## 2021-04-02 DIAGNOSIS — R634 Abnormal weight loss: Secondary | ICD-10-CM | POA: Diagnosis not present

## 2021-04-02 DIAGNOSIS — C7951 Secondary malignant neoplasm of bone: Secondary | ICD-10-CM | POA: Diagnosis not present

## 2021-04-02 DIAGNOSIS — F0390 Unspecified dementia without behavioral disturbance: Secondary | ICD-10-CM | POA: Diagnosis not present

## 2021-04-02 DIAGNOSIS — E785 Hyperlipidemia, unspecified: Secondary | ICD-10-CM | POA: Diagnosis not present

## 2021-04-03 DIAGNOSIS — E785 Hyperlipidemia, unspecified: Secondary | ICD-10-CM | POA: Diagnosis not present

## 2021-04-03 DIAGNOSIS — I10 Essential (primary) hypertension: Secondary | ICD-10-CM | POA: Diagnosis not present

## 2021-04-03 DIAGNOSIS — R634 Abnormal weight loss: Secondary | ICD-10-CM | POA: Diagnosis not present

## 2021-04-03 DIAGNOSIS — F0390 Unspecified dementia without behavioral disturbance: Secondary | ICD-10-CM | POA: Diagnosis not present

## 2021-04-03 DIAGNOSIS — C541 Malignant neoplasm of endometrium: Secondary | ICD-10-CM | POA: Diagnosis not present

## 2021-04-03 DIAGNOSIS — C7951 Secondary malignant neoplasm of bone: Secondary | ICD-10-CM | POA: Diagnosis not present

## 2021-04-04 DIAGNOSIS — C541 Malignant neoplasm of endometrium: Secondary | ICD-10-CM | POA: Diagnosis not present

## 2021-04-04 DIAGNOSIS — C7951 Secondary malignant neoplasm of bone: Secondary | ICD-10-CM | POA: Diagnosis not present

## 2021-04-04 DIAGNOSIS — F0390 Unspecified dementia without behavioral disturbance: Secondary | ICD-10-CM | POA: Diagnosis not present

## 2021-04-04 DIAGNOSIS — R634 Abnormal weight loss: Secondary | ICD-10-CM | POA: Diagnosis not present

## 2021-04-04 DIAGNOSIS — E785 Hyperlipidemia, unspecified: Secondary | ICD-10-CM | POA: Diagnosis not present

## 2021-04-04 DIAGNOSIS — I10 Essential (primary) hypertension: Secondary | ICD-10-CM | POA: Diagnosis not present

## 2021-04-06 ENCOUNTER — Other Ambulatory Visit (HOSPITAL_COMMUNITY): Payer: Self-pay

## 2021-04-06 DIAGNOSIS — R634 Abnormal weight loss: Secondary | ICD-10-CM | POA: Diagnosis not present

## 2021-04-06 DIAGNOSIS — C7951 Secondary malignant neoplasm of bone: Secondary | ICD-10-CM | POA: Diagnosis not present

## 2021-04-06 DIAGNOSIS — C541 Malignant neoplasm of endometrium: Secondary | ICD-10-CM | POA: Diagnosis not present

## 2021-04-06 DIAGNOSIS — F0390 Unspecified dementia without behavioral disturbance: Secondary | ICD-10-CM | POA: Diagnosis not present

## 2021-04-06 DIAGNOSIS — E785 Hyperlipidemia, unspecified: Secondary | ICD-10-CM | POA: Diagnosis not present

## 2021-04-06 DIAGNOSIS — I10 Essential (primary) hypertension: Secondary | ICD-10-CM | POA: Diagnosis not present

## 2021-04-06 MED ORDER — METHADONE HCL 10 MG/ML PO CONC
ORAL | 0 refills | Status: AC
  Filled 2021-04-06: qty 30, 7d supply, fill #0

## 2021-04-07 DIAGNOSIS — E785 Hyperlipidemia, unspecified: Secondary | ICD-10-CM | POA: Diagnosis not present

## 2021-04-07 DIAGNOSIS — C541 Malignant neoplasm of endometrium: Secondary | ICD-10-CM | POA: Diagnosis not present

## 2021-04-07 DIAGNOSIS — R634 Abnormal weight loss: Secondary | ICD-10-CM | POA: Diagnosis not present

## 2021-04-07 DIAGNOSIS — I10 Essential (primary) hypertension: Secondary | ICD-10-CM | POA: Diagnosis not present

## 2021-04-07 DIAGNOSIS — C7951 Secondary malignant neoplasm of bone: Secondary | ICD-10-CM | POA: Diagnosis not present

## 2021-04-07 DIAGNOSIS — F0390 Unspecified dementia without behavioral disturbance: Secondary | ICD-10-CM | POA: Diagnosis not present

## 2021-04-08 DIAGNOSIS — I10 Essential (primary) hypertension: Secondary | ICD-10-CM | POA: Diagnosis not present

## 2021-04-08 DIAGNOSIS — R634 Abnormal weight loss: Secondary | ICD-10-CM | POA: Diagnosis not present

## 2021-04-08 DIAGNOSIS — C7951 Secondary malignant neoplasm of bone: Secondary | ICD-10-CM | POA: Diagnosis not present

## 2021-04-08 DIAGNOSIS — F0390 Unspecified dementia without behavioral disturbance: Secondary | ICD-10-CM | POA: Diagnosis not present

## 2021-04-08 DIAGNOSIS — C541 Malignant neoplasm of endometrium: Secondary | ICD-10-CM | POA: Diagnosis not present

## 2021-04-08 DIAGNOSIS — E785 Hyperlipidemia, unspecified: Secondary | ICD-10-CM | POA: Diagnosis not present

## 2021-04-09 DIAGNOSIS — C7951 Secondary malignant neoplasm of bone: Secondary | ICD-10-CM | POA: Diagnosis not present

## 2021-04-09 DIAGNOSIS — E785 Hyperlipidemia, unspecified: Secondary | ICD-10-CM | POA: Diagnosis not present

## 2021-04-09 DIAGNOSIS — R634 Abnormal weight loss: Secondary | ICD-10-CM | POA: Diagnosis not present

## 2021-04-09 DIAGNOSIS — F0390 Unspecified dementia without behavioral disturbance: Secondary | ICD-10-CM | POA: Diagnosis not present

## 2021-04-09 DIAGNOSIS — C541 Malignant neoplasm of endometrium: Secondary | ICD-10-CM | POA: Diagnosis not present

## 2021-04-09 DIAGNOSIS — I10 Essential (primary) hypertension: Secondary | ICD-10-CM | POA: Diagnosis not present

## 2021-04-10 ENCOUNTER — Encounter (HOSPITAL_BASED_OUTPATIENT_CLINIC_OR_DEPARTMENT_OTHER): Payer: Medicare Other | Admitting: Internal Medicine

## 2021-04-10 DIAGNOSIS — I10 Essential (primary) hypertension: Secondary | ICD-10-CM | POA: Diagnosis not present

## 2021-04-10 DIAGNOSIS — C7951 Secondary malignant neoplasm of bone: Secondary | ICD-10-CM | POA: Diagnosis not present

## 2021-04-10 DIAGNOSIS — E785 Hyperlipidemia, unspecified: Secondary | ICD-10-CM | POA: Diagnosis not present

## 2021-04-10 DIAGNOSIS — F0390 Unspecified dementia without behavioral disturbance: Secondary | ICD-10-CM | POA: Diagnosis not present

## 2021-04-10 DIAGNOSIS — R634 Abnormal weight loss: Secondary | ICD-10-CM | POA: Diagnosis not present

## 2021-04-10 DIAGNOSIS — C541 Malignant neoplasm of endometrium: Secondary | ICD-10-CM | POA: Diagnosis not present

## 2021-04-11 DIAGNOSIS — I10 Essential (primary) hypertension: Secondary | ICD-10-CM | POA: Diagnosis not present

## 2021-04-11 DIAGNOSIS — C7951 Secondary malignant neoplasm of bone: Secondary | ICD-10-CM | POA: Diagnosis not present

## 2021-04-11 DIAGNOSIS — E785 Hyperlipidemia, unspecified: Secondary | ICD-10-CM | POA: Diagnosis not present

## 2021-04-11 DIAGNOSIS — C541 Malignant neoplasm of endometrium: Secondary | ICD-10-CM | POA: Diagnosis not present

## 2021-04-11 DIAGNOSIS — R634 Abnormal weight loss: Secondary | ICD-10-CM | POA: Diagnosis not present

## 2021-04-11 DIAGNOSIS — F0390 Unspecified dementia without behavioral disturbance: Secondary | ICD-10-CM | POA: Diagnosis not present

## 2021-04-12 DIAGNOSIS — F0390 Unspecified dementia without behavioral disturbance: Secondary | ICD-10-CM | POA: Diagnosis not present

## 2021-04-12 DIAGNOSIS — E785 Hyperlipidemia, unspecified: Secondary | ICD-10-CM | POA: Diagnosis not present

## 2021-04-12 DIAGNOSIS — R634 Abnormal weight loss: Secondary | ICD-10-CM | POA: Diagnosis not present

## 2021-04-12 DIAGNOSIS — C541 Malignant neoplasm of endometrium: Secondary | ICD-10-CM | POA: Diagnosis not present

## 2021-04-12 DIAGNOSIS — I10 Essential (primary) hypertension: Secondary | ICD-10-CM | POA: Diagnosis not present

## 2021-04-12 DIAGNOSIS — C7951 Secondary malignant neoplasm of bone: Secondary | ICD-10-CM | POA: Diagnosis not present

## 2021-04-12 DIAGNOSIS — J302 Other seasonal allergic rhinitis: Secondary | ICD-10-CM | POA: Diagnosis not present

## 2021-04-12 DIAGNOSIS — Z85118 Personal history of other malignant neoplasm of bronchus and lung: Secondary | ICD-10-CM | POA: Diagnosis not present

## 2021-04-12 DIAGNOSIS — E039 Hypothyroidism, unspecified: Secondary | ICD-10-CM | POA: Diagnosis not present

## 2021-04-13 DIAGNOSIS — R634 Abnormal weight loss: Secondary | ICD-10-CM | POA: Diagnosis not present

## 2021-04-13 DIAGNOSIS — E785 Hyperlipidemia, unspecified: Secondary | ICD-10-CM | POA: Diagnosis not present

## 2021-04-13 DIAGNOSIS — I10 Essential (primary) hypertension: Secondary | ICD-10-CM | POA: Diagnosis not present

## 2021-04-13 DIAGNOSIS — F0390 Unspecified dementia without behavioral disturbance: Secondary | ICD-10-CM | POA: Diagnosis not present

## 2021-04-13 DIAGNOSIS — C7951 Secondary malignant neoplasm of bone: Secondary | ICD-10-CM | POA: Diagnosis not present

## 2021-04-13 DIAGNOSIS — C541 Malignant neoplasm of endometrium: Secondary | ICD-10-CM | POA: Diagnosis not present

## 2021-04-14 DIAGNOSIS — F0390 Unspecified dementia without behavioral disturbance: Secondary | ICD-10-CM | POA: Diagnosis not present

## 2021-04-14 DIAGNOSIS — C7951 Secondary malignant neoplasm of bone: Secondary | ICD-10-CM | POA: Diagnosis not present

## 2021-04-14 DIAGNOSIS — R634 Abnormal weight loss: Secondary | ICD-10-CM | POA: Diagnosis not present

## 2021-04-14 DIAGNOSIS — E785 Hyperlipidemia, unspecified: Secondary | ICD-10-CM | POA: Diagnosis not present

## 2021-04-14 DIAGNOSIS — C541 Malignant neoplasm of endometrium: Secondary | ICD-10-CM | POA: Diagnosis not present

## 2021-04-14 DIAGNOSIS — I10 Essential (primary) hypertension: Secondary | ICD-10-CM | POA: Diagnosis not present

## 2021-04-15 DIAGNOSIS — C541 Malignant neoplasm of endometrium: Secondary | ICD-10-CM | POA: Diagnosis not present

## 2021-04-15 DIAGNOSIS — C7951 Secondary malignant neoplasm of bone: Secondary | ICD-10-CM | POA: Diagnosis not present

## 2021-04-15 DIAGNOSIS — E785 Hyperlipidemia, unspecified: Secondary | ICD-10-CM | POA: Diagnosis not present

## 2021-04-15 DIAGNOSIS — R634 Abnormal weight loss: Secondary | ICD-10-CM | POA: Diagnosis not present

## 2021-04-15 DIAGNOSIS — F0390 Unspecified dementia without behavioral disturbance: Secondary | ICD-10-CM | POA: Diagnosis not present

## 2021-04-15 DIAGNOSIS — I10 Essential (primary) hypertension: Secondary | ICD-10-CM | POA: Diagnosis not present

## 2021-04-16 DIAGNOSIS — R634 Abnormal weight loss: Secondary | ICD-10-CM | POA: Diagnosis not present

## 2021-04-16 DIAGNOSIS — C541 Malignant neoplasm of endometrium: Secondary | ICD-10-CM | POA: Diagnosis not present

## 2021-04-16 DIAGNOSIS — C7951 Secondary malignant neoplasm of bone: Secondary | ICD-10-CM | POA: Diagnosis not present

## 2021-04-16 DIAGNOSIS — E785 Hyperlipidemia, unspecified: Secondary | ICD-10-CM | POA: Diagnosis not present

## 2021-04-16 DIAGNOSIS — F0390 Unspecified dementia without behavioral disturbance: Secondary | ICD-10-CM | POA: Diagnosis not present

## 2021-04-16 DIAGNOSIS — I10 Essential (primary) hypertension: Secondary | ICD-10-CM | POA: Diagnosis not present

## 2021-04-17 DIAGNOSIS — F0390 Unspecified dementia without behavioral disturbance: Secondary | ICD-10-CM | POA: Diagnosis not present

## 2021-04-17 DIAGNOSIS — I10 Essential (primary) hypertension: Secondary | ICD-10-CM | POA: Diagnosis not present

## 2021-04-17 DIAGNOSIS — C7951 Secondary malignant neoplasm of bone: Secondary | ICD-10-CM | POA: Diagnosis not present

## 2021-04-17 DIAGNOSIS — E785 Hyperlipidemia, unspecified: Secondary | ICD-10-CM | POA: Diagnosis not present

## 2021-04-17 DIAGNOSIS — R634 Abnormal weight loss: Secondary | ICD-10-CM | POA: Diagnosis not present

## 2021-04-17 DIAGNOSIS — C541 Malignant neoplasm of endometrium: Secondary | ICD-10-CM | POA: Diagnosis not present

## 2021-04-18 ENCOUNTER — Other Ambulatory Visit: Payer: Self-pay | Admitting: Neurology

## 2021-04-23 ENCOUNTER — Other Ambulatory Visit (HOSPITAL_COMMUNITY): Payer: Self-pay

## 2021-05-13 DEATH — deceased

## 2021-07-05 ENCOUNTER — Ambulatory Visit: Payer: Medicare Other | Admitting: Neurology
